# Patient Record
Sex: Male | Born: 1940 | Race: Black or African American | Hispanic: No | Marital: Single | Smoking: Current every day smoker
Health system: Southern US, Community
[De-identification: ages and names within clinical notes are randomized; demographics above are authoritative.]

## PROBLEM LIST (undated history)

## (undated) DIAGNOSIS — E1140 Type 2 diabetes mellitus with diabetic neuropathy, unspecified: Secondary | ICD-10-CM

## (undated) DIAGNOSIS — R7303 Prediabetes: Secondary | ICD-10-CM

## (undated) DIAGNOSIS — H409 Unspecified glaucoma: Secondary | ICD-10-CM

## (undated) DIAGNOSIS — M1990 Unspecified osteoarthritis, unspecified site: Secondary | ICD-10-CM

## (undated) DIAGNOSIS — I639 Cerebral infarction, unspecified: Secondary | ICD-10-CM

## (undated) DIAGNOSIS — I739 Peripheral vascular disease, unspecified: Secondary | ICD-10-CM

## (undated) DIAGNOSIS — M869 Osteomyelitis, unspecified: Secondary | ICD-10-CM

## (undated) DIAGNOSIS — I10 Essential (primary) hypertension: Secondary | ICD-10-CM

## (undated) DIAGNOSIS — E7800 Pure hypercholesterolemia, unspecified: Secondary | ICD-10-CM

## (undated) DIAGNOSIS — E119 Type 2 diabetes mellitus without complications: Secondary | ICD-10-CM

## (undated) HISTORY — PX: FOOT FRACTURE SURGERY: SHX645

## (undated) HISTORY — PX: HUMERUS FRACTURE SURGERY W/ IMPLANT: SHX671

## (undated) HISTORY — PX: BACK SURGERY: SHX140

## (undated) HISTORY — PX: COLONOSCOPY: SHX174

---
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
## 1997-12-12 ENCOUNTER — Encounter: Admission: RE | Admit: 1997-12-12 | Discharge: 1998-03-12 | Payer: Self-pay | Admitting: Orthopedic Surgery

## 1998-04-28 ENCOUNTER — Emergency Department (HOSPITAL_COMMUNITY): Admission: EM | Admit: 1998-04-28 | Discharge: 1998-04-28 | Payer: Self-pay | Admitting: Emergency Medicine

## 1998-04-28 ENCOUNTER — Encounter: Payer: Self-pay | Admitting: Emergency Medicine

## 1998-05-08 ENCOUNTER — Emergency Department (HOSPITAL_COMMUNITY): Admission: EM | Admit: 1998-05-08 | Discharge: 1998-05-08 | Payer: Self-pay | Admitting: Emergency Medicine

## 1998-05-08 ENCOUNTER — Encounter: Payer: Self-pay | Admitting: Emergency Medicine

## 1998-12-30 ENCOUNTER — Emergency Department (HOSPITAL_COMMUNITY): Admission: EM | Admit: 1998-12-30 | Discharge: 1998-12-30 | Payer: Self-pay | Admitting: Emergency Medicine

## 1999-01-13 ENCOUNTER — Emergency Department (HOSPITAL_COMMUNITY): Admission: EM | Admit: 1999-01-13 | Discharge: 1999-01-13 | Payer: Self-pay | Admitting: Internal Medicine

## 1999-01-26 ENCOUNTER — Emergency Department (HOSPITAL_COMMUNITY): Admission: EM | Admit: 1999-01-26 | Discharge: 1999-01-26 | Payer: Self-pay | Admitting: Emergency Medicine

## 1999-01-26 ENCOUNTER — Encounter: Payer: Self-pay | Admitting: Emergency Medicine

## 1999-01-27 ENCOUNTER — Encounter: Payer: Self-pay | Admitting: Emergency Medicine

## 2000-02-26 ENCOUNTER — Encounter: Payer: Self-pay | Admitting: Emergency Medicine

## 2000-02-26 ENCOUNTER — Emergency Department (HOSPITAL_COMMUNITY): Admission: EM | Admit: 2000-02-26 | Discharge: 2000-02-26 | Payer: Self-pay | Admitting: Emergency Medicine

## 2000-03-26 ENCOUNTER — Emergency Department (HOSPITAL_COMMUNITY): Admission: EM | Admit: 2000-03-26 | Discharge: 2000-03-26 | Payer: Self-pay | Admitting: Emergency Medicine

## 2000-03-26 ENCOUNTER — Encounter: Payer: Self-pay | Admitting: Emergency Medicine

## 2000-08-21 ENCOUNTER — Other Ambulatory Visit: Admission: RE | Admit: 2000-08-21 | Discharge: 2000-08-21 | Payer: Self-pay | Admitting: Internal Medicine

## 2000-12-23 ENCOUNTER — Emergency Department (HOSPITAL_COMMUNITY): Admission: EM | Admit: 2000-12-23 | Discharge: 2000-12-23 | Payer: Self-pay | Admitting: Emergency Medicine

## 2001-07-22 ENCOUNTER — Encounter: Payer: Self-pay | Admitting: *Deleted

## 2001-07-22 ENCOUNTER — Ambulatory Visit (HOSPITAL_COMMUNITY): Admission: RE | Admit: 2001-07-22 | Discharge: 2001-07-22 | Payer: Self-pay | Admitting: *Deleted

## 2001-08-10 ENCOUNTER — Ambulatory Visit (HOSPITAL_COMMUNITY): Admission: RE | Admit: 2001-08-10 | Discharge: 2001-08-10 | Payer: Self-pay | Admitting: *Deleted

## 2001-08-10 ENCOUNTER — Encounter: Payer: Self-pay | Admitting: *Deleted

## 2003-07-14 ENCOUNTER — Emergency Department (HOSPITAL_COMMUNITY): Admission: EM | Admit: 2003-07-14 | Discharge: 2003-07-14 | Payer: Self-pay | Admitting: Emergency Medicine

## 2004-05-25 ENCOUNTER — Emergency Department (HOSPITAL_COMMUNITY): Admission: EM | Admit: 2004-05-25 | Discharge: 2004-05-25 | Payer: Self-pay | Admitting: Emergency Medicine

## 2004-06-17 HISTORY — PX: INCISION AND DRAINAGE OF WOUND: SHX1803

## 2004-08-16 ENCOUNTER — Ambulatory Visit: Payer: Self-pay | Admitting: Internal Medicine

## 2004-08-28 ENCOUNTER — Ambulatory Visit: Payer: Self-pay | Admitting: Internal Medicine

## 2004-08-29 ENCOUNTER — Ambulatory Visit: Payer: Self-pay | Admitting: Family Medicine

## 2004-10-02 ENCOUNTER — Ambulatory Visit: Payer: Self-pay | Admitting: Family Medicine

## 2004-10-03 ENCOUNTER — Ambulatory Visit: Payer: Self-pay | Admitting: Family Medicine

## 2004-10-18 ENCOUNTER — Ambulatory Visit: Payer: Self-pay | Admitting: Family Medicine

## 2004-10-31 ENCOUNTER — Ambulatory Visit: Payer: Self-pay | Admitting: Family Medicine

## 2004-11-08 ENCOUNTER — Ambulatory Visit: Payer: Self-pay | Admitting: Family Medicine

## 2004-11-13 ENCOUNTER — Ambulatory Visit: Payer: Self-pay | Admitting: Family Medicine

## 2004-11-16 ENCOUNTER — Ambulatory Visit: Payer: Self-pay | Admitting: Family Medicine

## 2004-12-21 ENCOUNTER — Ambulatory Visit: Payer: Self-pay | Admitting: Family Medicine

## 2005-02-15 ENCOUNTER — Encounter (INDEPENDENT_AMBULATORY_CARE_PROVIDER_SITE_OTHER): Payer: Self-pay | Admitting: Family Medicine

## 2005-02-15 LAB — CONVERTED CEMR LAB

## 2005-02-21 ENCOUNTER — Ambulatory Visit: Payer: Self-pay | Admitting: Family Medicine

## 2005-03-17 DIAGNOSIS — K648 Other hemorrhoids: Secondary | ICD-10-CM | POA: Insufficient documentation

## 2005-03-17 DIAGNOSIS — Z860100 Personal history of colon polyps, unspecified: Secondary | ICD-10-CM | POA: Insufficient documentation

## 2005-03-27 ENCOUNTER — Ambulatory Visit (HOSPITAL_COMMUNITY): Admission: RE | Admit: 2005-03-27 | Discharge: 2005-03-27 | Payer: Self-pay | Admitting: Gastroenterology

## 2005-06-04 ENCOUNTER — Ambulatory Visit: Payer: Self-pay | Admitting: Family Medicine

## 2005-08-02 ENCOUNTER — Ambulatory Visit: Payer: Self-pay | Admitting: Internal Medicine

## 2005-08-20 ENCOUNTER — Ambulatory Visit: Payer: Self-pay | Admitting: Internal Medicine

## 2005-09-01 ENCOUNTER — Emergency Department (HOSPITAL_COMMUNITY): Admission: EM | Admit: 2005-09-01 | Discharge: 2005-09-01 | Payer: Self-pay | Admitting: *Deleted

## 2005-09-03 ENCOUNTER — Ambulatory Visit: Payer: Self-pay | Admitting: Internal Medicine

## 2005-09-17 ENCOUNTER — Ambulatory Visit: Payer: Self-pay | Admitting: Family Medicine

## 2005-09-23 ENCOUNTER — Ambulatory Visit: Payer: Self-pay | Admitting: Internal Medicine

## 2005-10-08 ENCOUNTER — Ambulatory Visit: Payer: Self-pay | Admitting: Family Medicine

## 2005-10-15 ENCOUNTER — Emergency Department (HOSPITAL_COMMUNITY): Admission: EM | Admit: 2005-10-15 | Discharge: 2005-10-15 | Payer: Self-pay | Admitting: Emergency Medicine

## 2005-10-23 ENCOUNTER — Ambulatory Visit: Payer: Self-pay | Admitting: Family Medicine

## 2005-10-29 ENCOUNTER — Ambulatory Visit: Payer: Self-pay | Admitting: Family Medicine

## 2005-11-25 ENCOUNTER — Ambulatory Visit: Payer: Self-pay | Admitting: Family Medicine

## 2005-12-06 ENCOUNTER — Ambulatory Visit: Payer: Self-pay | Admitting: Family Medicine

## 2005-12-09 ENCOUNTER — Ambulatory Visit: Payer: Self-pay | Admitting: Internal Medicine

## 2005-12-17 ENCOUNTER — Ambulatory Visit: Payer: Self-pay | Admitting: Family Medicine

## 2006-01-07 ENCOUNTER — Ambulatory Visit: Payer: Self-pay | Admitting: Family Medicine

## 2006-01-10 ENCOUNTER — Ambulatory Visit: Payer: Self-pay | Admitting: Family Medicine

## 2006-02-11 ENCOUNTER — Ambulatory Visit: Payer: Self-pay | Admitting: Family Medicine

## 2006-04-17 ENCOUNTER — Ambulatory Visit: Payer: Self-pay | Admitting: Family Medicine

## 2006-05-22 ENCOUNTER — Ambulatory Visit: Payer: Self-pay | Admitting: Family Medicine

## 2006-05-29 ENCOUNTER — Ambulatory Visit: Payer: Self-pay | Admitting: Family Medicine

## 2006-06-13 ENCOUNTER — Ambulatory Visit: Payer: Self-pay | Admitting: Internal Medicine

## 2006-06-17 HISTORY — PX: LUMBAR DISC SURGERY: SHX700

## 2006-06-18 ENCOUNTER — Ambulatory Visit (HOSPITAL_COMMUNITY): Admission: RE | Admit: 2006-06-18 | Discharge: 2006-06-18 | Payer: Self-pay | Admitting: Internal Medicine

## 2006-07-30 ENCOUNTER — Ambulatory Visit: Payer: Self-pay | Admitting: Family Medicine

## 2007-03-05 ENCOUNTER — Encounter (INDEPENDENT_AMBULATORY_CARE_PROVIDER_SITE_OTHER): Payer: Self-pay | Admitting: Family Medicine

## 2007-03-05 ENCOUNTER — Emergency Department (HOSPITAL_COMMUNITY): Admission: EM | Admit: 2007-03-05 | Discharge: 2007-03-05 | Payer: Self-pay | Admitting: Emergency Medicine

## 2007-03-05 DIAGNOSIS — I70209 Unspecified atherosclerosis of native arteries of extremities, unspecified extremity: Secondary | ICD-10-CM | POA: Insufficient documentation

## 2007-03-05 DIAGNOSIS — E1151 Type 2 diabetes mellitus with diabetic peripheral angiopathy without gangrene: Secondary | ICD-10-CM | POA: Insufficient documentation

## 2007-03-05 DIAGNOSIS — K644 Residual hemorrhoidal skin tags: Secondary | ICD-10-CM | POA: Insufficient documentation

## 2007-03-05 DIAGNOSIS — M109 Gout, unspecified: Secondary | ICD-10-CM | POA: Insufficient documentation

## 2007-03-05 DIAGNOSIS — K7689 Other specified diseases of liver: Secondary | ICD-10-CM | POA: Insufficient documentation

## 2007-03-05 DIAGNOSIS — D1800 Hemangioma unspecified site: Secondary | ICD-10-CM | POA: Insufficient documentation

## 2007-03-05 DIAGNOSIS — K439 Ventral hernia without obstruction or gangrene: Secondary | ICD-10-CM | POA: Insufficient documentation

## 2007-03-05 DIAGNOSIS — M1990 Unspecified osteoarthritis, unspecified site: Secondary | ICD-10-CM | POA: Insufficient documentation

## 2007-03-05 DIAGNOSIS — F528 Other sexual dysfunction not due to a substance or known physiological condition: Secondary | ICD-10-CM | POA: Insufficient documentation

## 2007-03-05 DIAGNOSIS — E785 Hyperlipidemia, unspecified: Secondary | ICD-10-CM | POA: Insufficient documentation

## 2007-04-07 ENCOUNTER — Emergency Department (HOSPITAL_COMMUNITY): Admission: EM | Admit: 2007-04-07 | Discharge: 2007-04-07 | Payer: Self-pay | Admitting: Emergency Medicine

## 2007-04-07 ENCOUNTER — Telehealth (INDEPENDENT_AMBULATORY_CARE_PROVIDER_SITE_OTHER): Payer: Self-pay | Admitting: *Deleted

## 2007-05-21 ENCOUNTER — Encounter (INDEPENDENT_AMBULATORY_CARE_PROVIDER_SITE_OTHER): Payer: Self-pay | Admitting: Family Medicine

## 2007-06-10 ENCOUNTER — Ambulatory Visit: Payer: Self-pay | Admitting: Family Medicine

## 2007-06-10 LAB — CONVERTED CEMR LAB: Blood Glucose, Fingerstick: 462

## 2007-06-17 LAB — CONVERTED CEMR LAB
ALT: 12 units/L (ref 0–53)
AST: 9 units/L (ref 0–37)
Albumin: 4.2 g/dL (ref 3.5–5.2)
Alkaline Phosphatase: 73 units/L (ref 39–117)
BUN: 10 mg/dL (ref 6–23)
Basophils Absolute: 0 10*3/uL (ref 0.0–0.1)
Basophils Relative: 1 % (ref 0–1)
CO2: 22 meq/L (ref 19–32)
Calcium: 8.9 mg/dL (ref 8.4–10.5)
Chloride: 99 meq/L (ref 96–112)
Cholesterol: 293 mg/dL — ABNORMAL HIGH (ref 0–200)
Creatinine, Ser: 0.97 mg/dL (ref 0.40–1.50)
Eosinophils Absolute: 0.8 10*3/uL — ABNORMAL HIGH (ref 0.2–0.7)
Eosinophils Relative: 12 % — ABNORMAL HIGH (ref 0–5)
Glucose, Bld: 370 mg/dL — ABNORMAL HIGH (ref 70–99)
HCT: 44 % (ref 39.0–52.0)
HDL: 56 mg/dL (ref >39)
Hemoglobin: 14.3 g/dL (ref 13.0–17.0)
LDL Cholesterol: 194 mg/dL — ABNORMAL HIGH (ref 0–99)
Lymphocytes Relative: 29 % (ref 12–46)
Lymphs Abs: 1.9 10*3/uL (ref 0.7–4.0)
MCHC: 32.5 g/dL (ref 30.0–36.0)
MCV: 86.6 fL (ref 78.0–100.0)
Monocytes Absolute: 0.4 10*3/uL (ref 0.1–1.0)
Monocytes Relative: 6 % (ref 3–12)
Neutro Abs: 3.3 10*3/uL (ref 1.7–7.7)
Neutrophils Relative %: 52 % (ref 43–77)
Platelets: 284 10*3/uL (ref 150–400)
Potassium: 4.6 meq/L (ref 3.5–5.3)
RBC: 5.08 M/uL (ref 4.22–5.81)
RDW: 15 % (ref 11.5–15.5)
Sodium: 134 meq/L — ABNORMAL LOW (ref 135–145)
TSH: 1.446 microintl units/mL (ref 0.350–5.50)
Total Bilirubin: 0.5 mg/dL (ref 0.3–1.2)
Total CHOL/HDL Ratio: 5.2
Total Protein: 8 g/dL (ref 6.0–8.3)
Triglycerides: 215 mg/dL — ABNORMAL HIGH (ref <150)
VLDL: 43 mg/dL — ABNORMAL HIGH (ref 0–40)
WBC: 6.3 10*3/uL (ref 4.0–10.5)

## 2007-06-29 ENCOUNTER — Encounter (INDEPENDENT_AMBULATORY_CARE_PROVIDER_SITE_OTHER): Payer: Self-pay | Admitting: Family Medicine

## 2007-06-30 ENCOUNTER — Emergency Department (HOSPITAL_COMMUNITY): Admission: EM | Admit: 2007-06-30 | Discharge: 2007-06-30 | Payer: Self-pay | Admitting: Emergency Medicine

## 2007-08-02 ENCOUNTER — Emergency Department (HOSPITAL_COMMUNITY): Admission: EM | Admit: 2007-08-02 | Discharge: 2007-08-02 | Payer: Self-pay | Admitting: Emergency Medicine

## 2007-08-02 ENCOUNTER — Encounter (INDEPENDENT_AMBULATORY_CARE_PROVIDER_SITE_OTHER): Payer: Self-pay | Admitting: Family Medicine

## 2007-08-04 ENCOUNTER — Ambulatory Visit: Payer: Self-pay | Admitting: Family Medicine

## 2007-08-04 DIAGNOSIS — R519 Headache, unspecified: Secondary | ICD-10-CM | POA: Insufficient documentation

## 2007-08-04 DIAGNOSIS — R51 Headache: Secondary | ICD-10-CM | POA: Insufficient documentation

## 2007-08-04 LAB — CONVERTED CEMR LAB
Blood Glucose, Fingerstick: 509
Hgb A1c MFr Bld: >14 %
Sed Rate: 20 mm/hr — ABNORMAL HIGH (ref 0–16)

## 2007-08-07 ENCOUNTER — Telehealth (INDEPENDENT_AMBULATORY_CARE_PROVIDER_SITE_OTHER): Payer: Self-pay | Admitting: Family Medicine

## 2007-08-10 ENCOUNTER — Ambulatory Visit: Payer: Self-pay | Admitting: Family Medicine

## 2007-08-10 LAB — CONVERTED CEMR LAB: Blood Glucose, Fingerstick: 274

## 2007-08-19 ENCOUNTER — Ambulatory Visit: Payer: Self-pay | Admitting: Family Medicine

## 2007-08-19 LAB — CONVERTED CEMR LAB: Blood Glucose, Fingerstick: 600

## 2007-08-27 ENCOUNTER — Encounter (INDEPENDENT_AMBULATORY_CARE_PROVIDER_SITE_OTHER): Payer: Self-pay | Admitting: Family Medicine

## 2007-09-10 ENCOUNTER — Emergency Department (HOSPITAL_COMMUNITY): Admission: EM | Admit: 2007-09-10 | Discharge: 2007-09-10 | Payer: Self-pay | Admitting: Emergency Medicine

## 2007-09-16 ENCOUNTER — Ambulatory Visit: Payer: Self-pay | Admitting: Nurse Practitioner

## 2007-09-16 DIAGNOSIS — B3749 Other urogenital candidiasis: Secondary | ICD-10-CM | POA: Insufficient documentation

## 2007-09-16 LAB — CONVERTED CEMR LAB
Bilirubin Urine: NEGATIVE
Blood Glucose, Fingerstick: 315
Glucose, Urine, Semiquant: >=1000
Nitrite: NEGATIVE
Protein, U semiquant: NEGATIVE
Specific Gravity, Urine: 1.01
Urobilinogen, UA: 0.2
WBC Urine, dipstick: NEGATIVE
pH: 5

## 2007-09-24 ENCOUNTER — Emergency Department (HOSPITAL_COMMUNITY): Admission: EM | Admit: 2007-09-24 | Discharge: 2007-09-24 | Payer: Self-pay | Admitting: Emergency Medicine

## 2007-09-28 ENCOUNTER — Telehealth (INDEPENDENT_AMBULATORY_CARE_PROVIDER_SITE_OTHER): Payer: Self-pay | Admitting: Family Medicine

## 2007-10-01 ENCOUNTER — Encounter (INDEPENDENT_AMBULATORY_CARE_PROVIDER_SITE_OTHER): Payer: Self-pay | Admitting: Family Medicine

## 2007-10-13 ENCOUNTER — Ambulatory Visit: Payer: Self-pay | Admitting: Family Medicine

## 2007-10-13 LAB — CONVERTED CEMR LAB: Blood Glucose, Fingerstick: 509

## 2007-10-14 ENCOUNTER — Telehealth (INDEPENDENT_AMBULATORY_CARE_PROVIDER_SITE_OTHER): Payer: Self-pay | Admitting: Family Medicine

## 2007-10-20 ENCOUNTER — Telehealth (INDEPENDENT_AMBULATORY_CARE_PROVIDER_SITE_OTHER): Payer: Self-pay | Admitting: Family Medicine

## 2007-10-23 ENCOUNTER — Telehealth (INDEPENDENT_AMBULATORY_CARE_PROVIDER_SITE_OTHER): Payer: Self-pay | Admitting: Family Medicine

## 2007-11-27 ENCOUNTER — Emergency Department (HOSPITAL_COMMUNITY): Admission: EM | Admit: 2007-11-27 | Discharge: 2007-11-27 | Payer: Self-pay | Admitting: Emergency Medicine

## 2007-12-03 ENCOUNTER — Ambulatory Visit (HOSPITAL_COMMUNITY): Admission: RE | Admit: 2007-12-03 | Discharge: 2007-12-03 | Payer: Self-pay | Admitting: Internal Medicine

## 2007-12-03 ENCOUNTER — Ambulatory Visit: Payer: Self-pay | Admitting: Internal Medicine

## 2007-12-03 DIAGNOSIS — R071 Chest pain on breathing: Secondary | ICD-10-CM | POA: Insufficient documentation

## 2007-12-03 LAB — CONVERTED CEMR LAB: Blood Glucose, Fingerstick: 415

## 2007-12-10 ENCOUNTER — Emergency Department (HOSPITAL_COMMUNITY): Admission: EM | Admit: 2007-12-10 | Discharge: 2007-12-10 | Payer: Self-pay | Admitting: Emergency Medicine

## 2007-12-18 ENCOUNTER — Emergency Department (HOSPITAL_COMMUNITY): Admission: EM | Admit: 2007-12-18 | Discharge: 2007-12-18 | Payer: Self-pay | Admitting: Emergency Medicine

## 2007-12-23 ENCOUNTER — Ambulatory Visit: Payer: Self-pay | Admitting: Family Medicine

## 2007-12-23 LAB — CONVERTED CEMR LAB: Blood Glucose, Fingerstick: 282

## 2007-12-28 ENCOUNTER — Telehealth (INDEPENDENT_AMBULATORY_CARE_PROVIDER_SITE_OTHER): Payer: Self-pay | Admitting: *Deleted

## 2008-01-05 ENCOUNTER — Emergency Department (HOSPITAL_COMMUNITY): Admission: EM | Admit: 2008-01-05 | Discharge: 2008-01-05 | Payer: Self-pay | Admitting: Emergency Medicine

## 2008-01-05 ENCOUNTER — Telehealth (INDEPENDENT_AMBULATORY_CARE_PROVIDER_SITE_OTHER): Payer: Self-pay | Admitting: Family Medicine

## 2008-01-08 ENCOUNTER — Emergency Department (HOSPITAL_COMMUNITY): Admission: EM | Admit: 2008-01-08 | Discharge: 2008-01-08 | Payer: Self-pay | Admitting: Emergency Medicine

## 2008-01-11 ENCOUNTER — Ambulatory Visit: Payer: Self-pay | Admitting: Family Medicine

## 2008-01-11 DIAGNOSIS — B0229 Other postherpetic nervous system involvement: Secondary | ICD-10-CM | POA: Insufficient documentation

## 2008-01-11 LAB — CONVERTED CEMR LAB: Blood Glucose, Fingerstick: 304

## 2008-02-06 ENCOUNTER — Telehealth (INDEPENDENT_AMBULATORY_CARE_PROVIDER_SITE_OTHER): Payer: Self-pay | Admitting: Family Medicine

## 2008-02-12 ENCOUNTER — Encounter
Admission: RE | Admit: 2008-02-12 | Discharge: 2008-05-12 | Payer: Self-pay | Admitting: Physical Medicine & Rehabilitation

## 2008-02-12 ENCOUNTER — Emergency Department (HOSPITAL_COMMUNITY): Admission: EM | Admit: 2008-02-12 | Discharge: 2008-02-12 | Payer: Self-pay | Admitting: Emergency Medicine

## 2008-02-15 ENCOUNTER — Ambulatory Visit: Payer: Self-pay | Admitting: Physical Medicine & Rehabilitation

## 2008-02-17 ENCOUNTER — Encounter (INDEPENDENT_AMBULATORY_CARE_PROVIDER_SITE_OTHER): Payer: Self-pay | Admitting: Family Medicine

## 2008-02-17 ENCOUNTER — Ambulatory Visit (HOSPITAL_COMMUNITY)
Admission: RE | Admit: 2008-02-17 | Discharge: 2008-02-17 | Payer: Self-pay | Admitting: Physical Medicine & Rehabilitation

## 2008-02-21 ENCOUNTER — Emergency Department (HOSPITAL_COMMUNITY): Admission: EM | Admit: 2008-02-21 | Discharge: 2008-02-21 | Payer: Self-pay | Admitting: Emergency Medicine

## 2008-02-23 ENCOUNTER — Ambulatory Visit: Payer: Self-pay | Admitting: Family Medicine

## 2008-02-23 DIAGNOSIS — IMO0002 Reserved for concepts with insufficient information to code with codable children: Secondary | ICD-10-CM | POA: Insufficient documentation

## 2008-02-23 LAB — CONVERTED CEMR LAB: Blood Glucose, Fingerstick: 356

## 2008-02-29 ENCOUNTER — Ambulatory Visit (HOSPITAL_BASED_OUTPATIENT_CLINIC_OR_DEPARTMENT_OTHER): Admission: RE | Admit: 2008-02-29 | Discharge: 2008-02-29 | Payer: Self-pay | Admitting: Urology

## 2008-03-11 ENCOUNTER — Ambulatory Visit: Payer: Self-pay | Admitting: Physical Medicine & Rehabilitation

## 2008-03-15 ENCOUNTER — Encounter (INDEPENDENT_AMBULATORY_CARE_PROVIDER_SITE_OTHER): Payer: Self-pay | Admitting: Family Medicine

## 2008-03-21 ENCOUNTER — Encounter
Admission: RE | Admit: 2008-03-21 | Discharge: 2008-05-03 | Payer: Self-pay | Admitting: Physical Medicine & Rehabilitation

## 2008-03-22 ENCOUNTER — Telehealth (INDEPENDENT_AMBULATORY_CARE_PROVIDER_SITE_OTHER): Payer: Self-pay | Admitting: *Deleted

## 2008-04-01 ENCOUNTER — Emergency Department (HOSPITAL_COMMUNITY): Admission: EM | Admit: 2008-04-01 | Discharge: 2008-04-01 | Payer: Self-pay | Admitting: Emergency Medicine

## 2008-04-12 ENCOUNTER — Emergency Department (HOSPITAL_COMMUNITY): Admission: EM | Admit: 2008-04-12 | Discharge: 2008-04-12 | Payer: Self-pay | Admitting: Emergency Medicine

## 2008-04-13 ENCOUNTER — Emergency Department (HOSPITAL_COMMUNITY): Admission: EM | Admit: 2008-04-13 | Discharge: 2008-04-13 | Payer: Self-pay | Admitting: Emergency Medicine

## 2008-04-22 ENCOUNTER — Ambulatory Visit: Payer: Self-pay | Admitting: Physical Medicine & Rehabilitation

## 2008-04-23 ENCOUNTER — Ambulatory Visit (HOSPITAL_COMMUNITY)
Admission: RE | Admit: 2008-04-23 | Discharge: 2008-04-23 | Payer: Self-pay | Admitting: Physical Medicine & Rehabilitation

## 2008-05-09 ENCOUNTER — Ambulatory Visit: Payer: Self-pay | Admitting: Physical Medicine & Rehabilitation

## 2008-05-23 ENCOUNTER — Encounter
Admission: RE | Admit: 2008-05-23 | Discharge: 2008-08-21 | Payer: Self-pay | Admitting: Physical Medicine & Rehabilitation

## 2008-05-23 ENCOUNTER — Ambulatory Visit: Payer: Self-pay | Admitting: Physical Medicine & Rehabilitation

## 2008-05-30 ENCOUNTER — Ambulatory Visit: Payer: Self-pay | Admitting: Physical Medicine & Rehabilitation

## 2008-06-09 ENCOUNTER — Emergency Department (HOSPITAL_COMMUNITY): Admission: EM | Admit: 2008-06-09 | Discharge: 2008-06-09 | Payer: Self-pay | Admitting: Emergency Medicine

## 2008-06-17 HISTORY — PX: CIRCUMCISION: SHX1350

## 2008-06-27 ENCOUNTER — Ambulatory Visit: Payer: Self-pay | Admitting: Physical Medicine & Rehabilitation

## 2008-07-25 ENCOUNTER — Ambulatory Visit: Payer: Self-pay | Admitting: Physical Medicine & Rehabilitation

## 2008-09-12 ENCOUNTER — Encounter
Admission: RE | Admit: 2008-09-12 | Discharge: 2008-09-19 | Payer: Self-pay | Admitting: Physical Medicine & Rehabilitation

## 2008-09-20 ENCOUNTER — Ambulatory Visit (HOSPITAL_COMMUNITY): Admission: RE | Admit: 2008-09-20 | Discharge: 2008-09-20 | Payer: Self-pay | Admitting: Neurosurgery

## 2008-12-05 ENCOUNTER — Emergency Department (HOSPITAL_COMMUNITY): Admission: EM | Admit: 2008-12-05 | Discharge: 2008-12-05 | Payer: Self-pay | Admitting: Emergency Medicine

## 2008-12-28 ENCOUNTER — Encounter: Admission: RE | Admit: 2008-12-28 | Discharge: 2008-12-28 | Payer: Self-pay | Admitting: Neurosurgery

## 2009-01-14 ENCOUNTER — Emergency Department (HOSPITAL_COMMUNITY): Admission: EM | Admit: 2009-01-14 | Discharge: 2009-01-14 | Payer: Self-pay | Admitting: Emergency Medicine

## 2009-02-02 ENCOUNTER — Encounter
Admission: RE | Admit: 2009-02-02 | Discharge: 2009-02-07 | Payer: Self-pay | Admitting: Physical Medicine & Rehabilitation

## 2009-02-07 ENCOUNTER — Ambulatory Visit: Payer: Self-pay | Admitting: Physical Medicine & Rehabilitation

## 2009-02-13 ENCOUNTER — Emergency Department (HOSPITAL_COMMUNITY): Admission: EM | Admit: 2009-02-13 | Discharge: 2009-02-13 | Payer: Self-pay | Admitting: Emergency Medicine

## 2009-02-15 ENCOUNTER — Emergency Department (HOSPITAL_COMMUNITY): Admission: EM | Admit: 2009-02-15 | Discharge: 2009-02-15 | Payer: Self-pay | Admitting: Emergency Medicine

## 2009-03-20 ENCOUNTER — Encounter
Admission: RE | Admit: 2009-03-20 | Discharge: 2009-06-14 | Payer: Self-pay | Admitting: Physical Medicine & Rehabilitation

## 2009-04-03 ENCOUNTER — Ambulatory Visit: Payer: Self-pay | Admitting: Physical Medicine & Rehabilitation

## 2009-05-14 ENCOUNTER — Emergency Department (HOSPITAL_COMMUNITY): Admission: EM | Admit: 2009-05-14 | Discharge: 2009-05-14 | Payer: Self-pay | Admitting: Emergency Medicine

## 2009-06-09 ENCOUNTER — Emergency Department (HOSPITAL_COMMUNITY): Admission: EM | Admit: 2009-06-09 | Discharge: 2009-06-09 | Payer: Self-pay | Admitting: Emergency Medicine

## 2009-06-18 ENCOUNTER — Emergency Department (HOSPITAL_COMMUNITY): Admission: EM | Admit: 2009-06-18 | Discharge: 2009-06-18 | Payer: Self-pay | Admitting: Emergency Medicine

## 2009-07-18 ENCOUNTER — Telehealth (INDEPENDENT_AMBULATORY_CARE_PROVIDER_SITE_OTHER): Payer: Self-pay | Admitting: *Deleted

## 2009-07-18 ENCOUNTER — Emergency Department (HOSPITAL_COMMUNITY)
Admission: EM | Admit: 2009-07-18 | Discharge: 2009-07-18 | Payer: Self-pay | Source: Home / Self Care | Admitting: Emergency Medicine

## 2009-07-25 ENCOUNTER — Encounter
Admission: RE | Admit: 2009-07-25 | Discharge: 2009-08-18 | Payer: Self-pay | Admitting: Physical Medicine & Rehabilitation

## 2009-07-26 ENCOUNTER — Emergency Department (HOSPITAL_COMMUNITY): Admission: EM | Admit: 2009-07-26 | Discharge: 2009-07-26 | Payer: Self-pay | Admitting: Emergency Medicine

## 2009-07-28 ENCOUNTER — Encounter: Payer: Self-pay | Admitting: Physician Assistant

## 2009-08-22 ENCOUNTER — Ambulatory Visit (HOSPITAL_COMMUNITY): Admission: RE | Admit: 2009-08-22 | Discharge: 2009-08-23 | Payer: Self-pay | Admitting: Neurosurgery

## 2009-09-01 ENCOUNTER — Emergency Department (HOSPITAL_COMMUNITY): Admission: EM | Admit: 2009-09-01 | Discharge: 2009-09-01 | Payer: Self-pay | Admitting: Emergency Medicine

## 2009-12-16 ENCOUNTER — Emergency Department (HOSPITAL_COMMUNITY): Admission: EM | Admit: 2009-12-16 | Discharge: 2009-12-16 | Payer: Self-pay | Admitting: Emergency Medicine

## 2009-12-29 ENCOUNTER — Emergency Department (HOSPITAL_COMMUNITY): Admission: EM | Admit: 2009-12-29 | Discharge: 2009-12-29 | Payer: Self-pay | Admitting: Emergency Medicine

## 2009-12-29 ENCOUNTER — Encounter (INDEPENDENT_AMBULATORY_CARE_PROVIDER_SITE_OTHER): Payer: Self-pay | Admitting: Emergency Medicine

## 2010-01-01 ENCOUNTER — Ambulatory Visit: Payer: Self-pay | Admitting: Vascular Surgery

## 2010-01-01 ENCOUNTER — Encounter (INDEPENDENT_AMBULATORY_CARE_PROVIDER_SITE_OTHER): Payer: Self-pay | Admitting: Neurosurgery

## 2010-01-01 ENCOUNTER — Ambulatory Visit: Admission: RE | Admit: 2010-01-01 | Discharge: 2010-01-01 | Payer: Self-pay | Admitting: Neurosurgery

## 2010-01-17 ENCOUNTER — Emergency Department (HOSPITAL_COMMUNITY): Admission: EM | Admit: 2010-01-17 | Discharge: 2010-01-17 | Payer: Self-pay | Admitting: Emergency Medicine

## 2010-02-07 ENCOUNTER — Emergency Department (HOSPITAL_COMMUNITY): Admission: EM | Admit: 2010-02-07 | Discharge: 2010-02-07 | Payer: Self-pay | Admitting: Emergency Medicine

## 2010-02-13 ENCOUNTER — Encounter (HOSPITAL_BASED_OUTPATIENT_CLINIC_OR_DEPARTMENT_OTHER): Admission: RE | Admit: 2010-02-13 | Discharge: 2010-05-14 | Payer: Self-pay | Admitting: General Surgery

## 2010-02-28 ENCOUNTER — Ambulatory Visit (HOSPITAL_COMMUNITY): Admission: RE | Admit: 2010-02-28 | Discharge: 2010-02-28 | Payer: Self-pay | Admitting: General Surgery

## 2010-03-22 ENCOUNTER — Ambulatory Visit (HOSPITAL_COMMUNITY): Admission: RE | Admit: 2010-03-22 | Discharge: 2010-03-22 | Payer: Self-pay | Admitting: General Surgery

## 2010-03-24 ENCOUNTER — Ambulatory Visit (HOSPITAL_COMMUNITY)
Admission: RE | Admit: 2010-03-24 | Discharge: 2010-03-24 | Payer: Self-pay | Source: Home / Self Care | Admitting: General Surgery

## 2010-04-09 ENCOUNTER — Encounter: Payer: Self-pay | Admitting: Cardiovascular Disease

## 2010-04-10 ENCOUNTER — Encounter: Payer: Self-pay | Admitting: Cardiovascular Disease

## 2010-04-10 ENCOUNTER — Ambulatory Visit: Payer: Self-pay

## 2010-04-10 ENCOUNTER — Encounter: Admission: RE | Admit: 2010-04-10 | Discharge: 2010-04-10 | Payer: Self-pay | Admitting: Neurology

## 2010-04-10 ENCOUNTER — Encounter: Payer: Self-pay | Admitting: Cardiology

## 2010-05-15 ENCOUNTER — Encounter (HOSPITAL_BASED_OUTPATIENT_CLINIC_OR_DEPARTMENT_OTHER)
Admission: RE | Admit: 2010-05-15 | Discharge: 2010-05-28 | Payer: Self-pay | Source: Home / Self Care | Attending: General Surgery | Admitting: General Surgery

## 2010-05-27 ENCOUNTER — Emergency Department (HOSPITAL_COMMUNITY)
Admission: EM | Admit: 2010-05-27 | Discharge: 2010-05-27 | Payer: Self-pay | Source: Home / Self Care | Admitting: Emergency Medicine

## 2010-05-29 ENCOUNTER — Encounter (HOSPITAL_BASED_OUTPATIENT_CLINIC_OR_DEPARTMENT_OTHER)
Admission: RE | Admit: 2010-05-29 | Discharge: 2010-07-03 | Payer: Self-pay | Source: Home / Self Care | Attending: General Surgery | Admitting: General Surgery

## 2010-06-15 ENCOUNTER — Emergency Department (HOSPITAL_COMMUNITY)
Admission: EM | Admit: 2010-06-15 | Discharge: 2010-06-15 | Payer: Self-pay | Source: Home / Self Care | Admitting: Emergency Medicine

## 2010-06-15 ENCOUNTER — Encounter (INDEPENDENT_AMBULATORY_CARE_PROVIDER_SITE_OTHER): Payer: Self-pay | Admitting: Emergency Medicine

## 2010-07-17 NOTE — Miscellaneous (Signed)
Summary: Orders Update  Clinical Lists Changes  Orders: Added new Test order of Arterial Duplex Lower Extremity (Arterial Duplex Low) - Signed 

## 2010-07-17 NOTE — Medication Information (Signed)
Summary: RX Folder/ALTERNATE DRUG AUTH.  RX Folder/ALTERNATE DRUG AUTH.   Imported By: Arta Bruce 10/01/2007 10:33:38  _____________________________________________________________________  External Attachment:    Type:   Image     Comment:   External Document

## 2010-07-17 NOTE — Assessment & Plan Note (Signed)
Summary: F/U HEADACES////RJP   Vital Signs:  Patient Profile:   70 Years Old Male Height:     73 inches Weight:      222.5 pounds Temp:     96.9 degrees F oral Pulse rate:   92 / minute Pulse rhythm:   regular Resp:     24 per minute BP sitting:   122 / 72  (left arm) Cuff size:   regular  Pt. in pain?   yes    Location:   headache    Intensity:   6    Type:       throbbing  Vitals Entered By: Verdis Prime SMA (August 19, 2007 12:10 PM)              Is Patient Diabetic? Yes  CBG Result 600 CBG Device ID A  Does patient need assistance? Functional Status Self care Ambulation Normal Comments still having headaches but not as intense, and not as frequent, going to the bathroom more frequently,  Roxicet 5-325-take 1-2 tabs by mouth q 6hrs as needed-Peter Tucich Ciprofloxacin HCL 500mg  taapo-take 1 tab twice a day-John Knapp pt also has no refills on Lyrica     Chief Complaint:  follow on headaches.  History of Present Illness: Here for f/u on right-sided temporal headaches. See prior notes for history. Pt reports improvement in headache severity and frequency on prednisone and amitriptyline.He is not taking any other pain medication and has not used the roxicet(from his podiatrist) in the last week.Still notes occasional blurred vision but no loss of vision or double vision.No nausea or vomiting. No pain with opening or closing his mouth or while chewing at meals. The "stinging" discomfort noted in the right occipital area at base of his skull has fully resolved.    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  1960  Left foot surgery       2.  1968  Right arm fracture       3.  1999  Left toe fractures       4.  2002  Left eye mass surgery  Dr. Dione Booze       5.  03/2005  Colonoscopy  Dr. Ewing Schlein    Risk Factors:  Caffeine use:  5+ drinks per day Alcohol use:  yes    Type:  moonshine Exercise:  no Seatbelt use:  75 % Sun Exposure:  frequently  Family History Risk Factors:    Family History of MI in females < 76 years old:  no    Family History of MI in males <  22 years old:  no    Physical Exam  General:     Well-developed,well-nourished,in no acute distress; alert,appropriate and cooperative throughout examination Head:     No tenderness or reproducible pain to palpation over right temporal or right occipital areas. Eyes:     No corneal or conjunctival inflammation noted. EOMI. Perrla. Mouth:     Normal TMJ movement bilaterally. No pain along mandible to palpation and no pain with mastication movements/open-closing mouth. Neurologic:     alert & oriented X3 and cranial nerves II-XII intact grossly.      Impression & Recommendations:  Problem # 1:  HEADACHE (ICD-784.0) His chronic daily headaches have improved. Will gradually taper down steroid use over next 10 days.  Pt to return to clinic if headaches worsen as prednisone tapered. If he does well with taper and headaches are rare/resolved off prednisone then he can f/u only if needed for other acute problem.Routine f/u in 4 months.  Continue amitripyline 25mg  at bedtime for the entire 30 days then d/c.  Taper prednisone 10 mg tablets as follows: 4qd x 2d, 2tabs qd x 2d, 1and 1/2 tabs once daily x2d,1 tab once  daily x2d,and then 1/2 tablet once daily x 2 days then off.   His updated medication list for this problem includes:    Hydrocodone-acetaminophen 7.5-750 Mg Tabs (Hydrocodone-acetaminophen) .Marland Kitchen... Per dr.zeigler(podiatrist)    Ibuprofen 800 Mg Tabs (Ibuprofen) .Marland Kitchen... Take one tablet by mouth every 8 hours as needed for headache take with food   Problem # 2:  DIABETES MELLITUS, TYPE II (ICD-250.00) Poor control;worsened in past week by steroids. Had increase in Novolog 70/30 on 08/10/07 when prednisone started. Pt to continue at this dose even with prednisone being tapered and eventually stopped. His updated medication list for this problem includes:    Glucotrol Xl 10 Mg Tb24 (Glipizide) ..... Every am    Metformin Hcl 1000 Mg Tabs (Metformin hcl) .Marland Kitchen..Marland Kitchen Two times a day    Benazepril Hcl 5 Mg Tabs (Benazepril hcl) .Marland Kitchen... 1 tablet by mouth daily    Novolog Mix 70/30 Flexpen 70-30 % Susp (Insulin aspart prot & aspart) ..... Inject 40 units in am and 30 units in pm   Complete Medication List: 1)  Cialis 20 Mg Tabs (Tadalafil) .Marland Kitchen.. 1 by mouth prior to sexual activity, limit use to one in 24 hour period 2)  Glucotrol Xl 10 Mg Tb24 (Glipizide) .... Every am 3)  Crestor 40 Mg Tabs (Rosuvastatin calcium) .Marland Kitchen.. 1 once daily 4)  Metformin Hcl 1000 Mg Tabs (Metformin hcl) .... Two times a day 5)  Benazepril Hcl 5 Mg Tabs (Benazepril hcl) .Marland Kitchen.. 1 tablet by mouth daily 6)  Novolog Mix 70/30 Flexpen 70-30 % Susp (Insulin aspart prot & aspart) .... Inject 40 units in am and 30 units in pm 7)  Hydrocodone-acetaminophen 7.5-750 Mg Tabs (Hydrocodone-acetaminophen) .... Per dr.zeigler(podiatrist) 8)  Lyrica 100 Mg Caps (Pregabalin) .... Take 1 capsule by mouth every 12 hours(per dr.zeigler) 9)  Ibuprofen 800 Mg Tabs (Ibuprofen) .... Take one tablet by mouth every 8 hours as needed for headache take with food 10)  Amitriptyline Hcl 25 Mg Tabs (Amitriptyline hcl) .... Take one tablet at bedtime 11)  Prednisone 10 Mg  Tabs (Prednisone) .... Take 4 tablets by mouth once daily x 2 days then 2 tabs x2d, 1 and 1/2 tabs x2d,1 tab x2d, and 1/2 tab x2days   Patient Instructions: 1)  See MD if headaches worsen as taper or come off prednisone. 2)  Otherwise, routine f/u on DM in 4 months.    Prescriptions: PREDNISONE 10 MG  TABS (PREDNISONE) Take 4 tablets by mouth once daily x 2 days then 2 tabs x2d, 1 and 1/2 tabs x2d,1 tab x2d, and 1/2 tab x2days  #20 x 0   Entered and Authorized by:   Beverley Fiedler MD   Signed by:   Beverley Fiedler MD on 08/19/2007   Method used:   Print then Give to Patient   RxID:   [id number]  ]

## 2010-07-17 NOTE — Letter (Signed)
Summary: [facility]   Imported By: Arta Bruce 04/26/2008 15:02:33  _____________________________________________________________________  External Attachment:    Type:   Image     Comment:   External Document

## 2010-07-17 NOTE — Assessment & Plan Note (Signed)
Summary: for surgery///kt   Vital Signs:  Patient Profile:   70 Years Old Male Height:     73 inches Weight:      220 pounds BMI:     29.13 Temp:     97.2 degrees F Pulse rate:   96 / minute Pulse rhythm:   regular Resp:     18 per minute BP sitting:   126 / 80  (left arm) Cuff size:   large  Pt. in pain?   yes    Location:   back    Intensity:   8  Vitals Entered By: Vesta Mixer CMA (December 23, 2007 8:47 AM)              Is Patient Diabetic? Yes  CBG Result 282  Does patient need assistance? Ambulation Normal     Chief Complaint:  Back is killing him has been to ED about 6 times since last ov, but still hurting.  Also, and needs to have a circumsicion done.Marland Kitchen  History of Present Illness: Here for f/u on thoracic/side pain. Describes as burning pain. Has been to ER multiple times. Saw Dr.Mulberry in 6/09 but no specific findings on exam or on thoracic spine/CXR done in 6/09. Pt never had an identifiable rash.  He is also ready for urology referral for  circumcision. He has documented re-current ballinitis and fungal infections. We have previouslt discussed the solution of a circumcision and he is ready to proceed.    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  1960  Left foot surgery       2.  1968   Right arm fracture       3.  1999  Left toe fractures       4.  2002  Left eye mass surgery  Dr. Dione Booze       5.  03/2005  Colonoscopy  Dr. Ewing Schlein      Physical Exam  General:     Well-developed,well-nourished,in no acute distress; alert,appropriate and cooperative throughout examination Chest Wall:     No bruises,step-off,rash. Pain is lateral and almost along dermatome of mid-right lateral ribs. Lungs:     Normal respiratory effort, chest expands symmetrically. Lungs are clear to auscultation, no crackles or wheezes. Heart:     Normal rate and regular rhythm. S1 and S2 normal without gallop, murmur, click, rub or other extra sounds.    Impression & Recommendations:  Problem # 1:  CHEST WALL PAIN, ACUTE (ICD-786.52) Persistent problem for patient. Possible pulled rib muscle(?). May use roxicet  in addition to his ibuprofen. His updated medication list for this problem includes:    Ibuprofen 800 Mg Tabs (Ibuprofen) .Marland Kitchen... Take one tablet by mouth every 8 hours as needed for headache take with food   Problem # 2:  CANDIDIASIS, GLANS PENIS (ICD-112.2) Recurrent problem with ballinitis. Urology referral for therapeutic circumcision.  Complete Medication List: 1)  Cialis 20 Mg Tabs (Tadalafil) .Marland Kitchen.. 1 by mouth prior to sexual activity, limit use to one in 24 hour period 2)  Glucotrol Xl 10 Mg Tb24 (Glipizide) .... Every am 3)  Metformin Hcl 1000 Mg Tabs (Metformin hcl) .... Two times a day 4)  Benazepril Hcl 5 Mg Tabs (Benazepril hcl) .Marland Kitchen.. 1 tablet by mouth daily 5)  Novolog Mix 70/30 Flexpen 70-30 % Susp (Insulin aspart prot & aspart) .... Inject 60  units in am and 40  units in pm 6)  Ibuprofen 800 Mg Tabs (Ibuprofen) .... Take one tablet by mouth every 8 hours as needed for headache take with food 7)  Amitriptyline Hcl 25 Mg Tabs (Amitriptyline hcl) .... Take one tablet at bedtime 8)  Vicodin 5-500 Mg Tabs (Hydrocodone-acetaminophen) .... Take 1 to 2 tablets by mouth every 6  hours as needed for pain *john r knapp 9)  Travatan Z 0.004 % Soln (Travoprost) .... Instill 1 drop in each eye at bedtime *robert groat 10)  Nystatin-triamcinolone 100000-0.1 Unit/gm-% Oint (Nystatin-triamcinolone) .Marland Kitchen.. 1 application to affected area two times a day 11)  Lipitor 80 Mg Tabs (Atorvastatin calcium) .... Take 1 tablet by mouth once a day for cholesterol. prescribed to replace the crestor which his insurance won't cover. 12)  Roxicet 5-325 Mg Tabs (Oxycodone-acetaminophen) .... Take 1-2 tablets by mouth every 6 hours as needed  (dr.tucich) 13)  Spectazole Cream  .... Apply to gland of penis and surrounding foreskin x 10 days 14)  Diflucan 200 Mg Tabs (Fluconazole) .... Take 1 tablet by mouth once a day x 9 days 15)  Oxycodone-acetaminophen 5-325 Mg Tabs (Oxycodone-acetaminophen) .Marland Kitchen.. 1-2 by mouth q 6 hours as needed pain 16)  Cyclobenzaprine Hcl 5 Mg Tabs (Cyclobenzaprine hcl) .Marland Kitchen.. 1 by mouth three times a day x 5 days  Other Orders: Urology Referral (Urology)   Patient Instructions: 1)  Please schedule a follow-up appointment as needed.   Prescriptions: ROXICET 5-325 MG  TABS (OXYCODONE-ACETAMINOPHEN) Take 1-2 tablets by mouth every 6 hours as needed  (Dr.Tucich)  #60 x 0   Entered and Authorized by:   Beverley Fiedler MD   Signed by:   Beverley Fiedler MD on 12/23/2007   Method used:   Print then Give to Patient   RxID:   [id number]  ]

## 2010-07-17 NOTE — Letter (Signed)
Summary: [facility]   Imported By: Arta Bruce 05/21/2007 09:41:42  _____________________________________________________________________  External Attachment:    Type:   Image     Comment:   External Document

## 2010-07-17 NOTE — Progress Notes (Signed)
Summary: call back  Phone Note Call from Patient Call back at Home Phone [phone]   Caller: Patient Summary of Call: Patient lmovm requesting a call back Initial call taken by: Rock Nephew CMA,  July 18, 2009 4:01 PM  Follow-up for Phone Call        Patient needs to est care, he is a diabetic. Per the patient he has Medicare and Medicaid. Patient also spoke with Harriett Sine and he will call back to discuss his insurance and if it a type that is taken here. Follow-up by: Lucious Groves,  July 18, 2009 4:15 PM

## 2010-07-17 NOTE — Progress Notes (Signed)
Summary: NEEDS HIS GLIPIZIDE  Phone Note Call from Patient   Summary of Call: [name] PT. FORGOT TO TO CALL HIS GLIPIZIDE TO [facility] IN [place] SO THEY CAN DELIVER IT AND HE WANTS TO KNOW IF YOU CAN AT LEAST CALL HIM IN ABOUT 15 TO GET HIM OVER UNTIL HIS ORDER IS SHIPPED TO HIM FROM [place]. [facility] [address] Initial call taken by: Leodis Rains,  August 07, 2007 2:39 PM  Follow-up for Phone Call        pt in office to be seen today. Follow-up by: Mikey College CMA,  August 10, 2007 11:47 AM  Additional Follow-up for Phone Call Additional follow up Details #1::        note seen after pt left andhe didn't mention at visit.[facility] [address]      Prescriptions: GLUCOTROL XL 10 MG  TB24 (GLIPIZIDE) every am  #30 x 2   Entered and Authorized by:   Beverley Fiedler MD   Signed by:   Beverley Fiedler MD on 08/11/2007   Method used:   Electronically [facility] to ...       [facility]  [address] [phone]*       [address]       [place], [place]  [address]       Ph: [phone] or [phone]       Fax: [phone]   RxID:   [id number]

## 2010-07-17 NOTE — Assessment & Plan Note (Signed)
Summary: HEADACHE////KT   Vital Signs:  Patient Profile:   70 Years Old Male Height:     73 inches Weight:      230 pounds BMI:     30.45 Temp:     97.4 degrees F Pulse rate:   76 / minute Pulse rhythm:   regular Resp:     20 per minute BP sitting:   120 / 78  (left arm) Cuff size:   large  Pt. in pain?   yes    Location:   head    Intensity:   9  Vitals Entered By: Vesta Mixer CMA (August 10, 2007 11:57 AM)              Is Patient Diabetic? Yes  CBG Result 274  Does patient need assistance? Ambulation Normal     Chief Complaint:  headache x 3 weeks and ibuprofen not helping.Marland Kitchen  History of Present Illness: Here for f/u on isolated right temporal Headache. The ibuprofen helped some with pain but never resolved headache. Has occasional blurriness but no loss of vision..Has some "stinging pain" Right posterior neck area which is intermittent. No skin rashes.No arm pain or numbness or tingling. No arm weakness. No fevers. No nausea or vomiting. No ear pain. Pain is still 8-9/10 per patient(he does not appear in severe distress) but he is not a complainer.     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  1960  Left foot surgery       2.  1968  Right arm fracture       3.  1999  Left toe fractures  4.  2002  Left eye mass surgery  Dr. Dione Booze       5.  03/2005  Colonoscopy  Dr. Ewing Schlein      Physical Exam  General:     Well-developed,well-nourished,in no acute distress; alert,appropriate and cooperative throughout examination Eyes:     No corneal or conjunctival inflammation noted. EOMI. Perrla. Fundi sharp bilaterally. Ears:     External ear exam shows no significant lesions or deformities.  Otoscopic examination reveals clear canals, tympanic membranes are intact bilaterally without bulging, retraction, inflammation or discharge. Hearing is grossly normal bilaterally. Nose:     External nasal examination shows no deformity or inflammation. Nasal mucosa are pink and moist without lesions or exudates. Mouth:     Oral mucosa and oropharynx without lesions or exudates.  Teeth without active problem. No pain with palpation of jaw or opening/closing of mouth. Neck:     supple, full ROM, no  cervical lymphadenopathy, and no neck tenderness.   Neurologic:     alert & oriented X3 and cranial nerves II-XII intact to specific testing. Pt is mildly tender to palpation over right temporal area in a very localized pattern. He reports a "stinging sensation" in the right posterior occipital area but no palpable abnormality. Skin:     No rashes or blister-like lesions on head,neck,upper trunk. Psych:     Oriented X3, memory intact for recent and remote, normally interactive, good eye contact, and not anxious appearing.   Pt appears very stoic and does not demonstrate pain on a 9/10 level but says this is the worst pain that he can recall.    Impression & Recommendations:  Problem # 1:  HEADACHE (ICD-784.0) MD called and spoke with Dr.Reynolds,neurologist. Unlikely to be temporal arteritis with normal ESR x2. But symptoms not classic for alternate diagnosis.  Recommended trial of steroids and TCA If no improvement in headache in 1-2 weeks then check MRI/MRA and refer to see neurologist. His  updated medication list for this problem includes:    Hydrocodone-acetaminophen 7.5-750 Mg Tabs (Hydrocodone-acetaminophen) .Marland Kitchen... Per dr.zeigler(podiatrist)    Ibuprofen 800 Mg Tabs (Ibuprofen) .Marland Kitchen... Take one tablet by mouth every 8 hours as needed for headache take with food   Problem # 2:  DIABETES MELLITUS, TYPE II (ICD-250.00) MD d/w patient that steroids may increase BS and that he can increase his 70/30 insulin by 5-10 units two times a day beyond his 40units AM or 30 units PM if needed. His updated medication list for this problem includes:    Glucotrol Xl 10 Mg Tb24 (Glipizide) ..... Every am    Metformin Hcl 1000 Mg Tabs (Metformin hcl) .Marland Kitchen..Marland Kitchen Two times a day    Benazepril Hcl 5 Mg Tabs (Benazepril hcl) .Marland Kitchen... 1 tablet by mouth daily    Novolog Mix 70/30 Flexpen 70-30 % Susp (Insulin aspart prot & aspart) ..... Inject 40 units in am and 30 units in pm  Orders: Capillary Blood Glucose (82948) Fingerstick (04540)   Complete Medication List: 1)  Cialis 20 Mg Tabs (Tadalafil) .Marland Kitchen.. 1 by mouth prior to sexual activity, limit use to one in 24 hour period 2)  Glucotrol Xl 10 Mg Tb24 (Glipizide) .... Every am 3)  Crestor 40 Mg Tabs (Rosuvastatin calcium) .Marland Kitchen.. 1 once daily 4)  Metformin Hcl 1000 Mg Tabs (Metformin hcl) .... Two times a day 5)  Benazepril Hcl 5 Mg Tabs (Benazepril hcl) .Marland Kitchen.. 1 tablet by mouth daily 6)  Novolog Mix 70/30 Flexpen 70-30 % Susp (Insulin aspart prot & aspart) .... Inject 40 units in am and 30 units in pm 7)  Hydrocodone-acetaminophen 7.5-750 Mg Tabs (Hydrocodone-acetaminophen) .... Per dr.zeigler(podiatrist) 8)  Lyrica 100 Mg Caps (Pregabalin) .... Take 1 capsule by mouth every 12 hours(per dr.zeigler) 9)  Ibuprofen 800 Mg Tabs (Ibuprofen) .... Take one tablet by mouth every 8 hours as needed for headache take with food 10)  Prednisone 20 Mg Tabs (Prednisone) .... Take 3 tablets by mouth each day 11)  Amitriptyline Hcl 25 Mg Tabs (Amitriptyline hcl) .... Take one  tablet at bedtime   Patient Instructions: 1)  Please schedule a follow-up appointment in 1 week. 2)  Keep headache diary 3)  Check sugars 3x each day and write down. May increase 70/30 insulin by 5-10 units AM and PM if sugars running over 200's on prednisone    Prescriptions: AMITRIPTYLINE HCL 25 MG TABS (AMITRIPTYLINE HCL) Take one tablet at bedtime  #30 x 0  Entered and Authorized by:   Beverley Fiedler MD   Signed by:   Beverley Fiedler MD on 08/10/2007   Method used:   Print then Give to Patient   RxID:   [id number] PREDNISONE 20 MG TABS (PREDNISONE) Take 3 tablets by mouth each day  #30 x 0   Entered and Authorized by:   Beverley Fiedler MD   Signed by:   Beverley Fiedler MD on 08/10/2007   Method used:   Print then Give to Patient   RxID:   [id number]  ]

## 2010-07-17 NOTE — Progress Notes (Signed)
Summary: [facility]bst.  lipitor 80qd for crestor 40qd.  Phone Note Outgoing Call   Summary of Call: CMA to call for prior authorization on lipitor as crestor is not covered. Paperwork is on [name] paper chart. Notify patient that once he has been on the new lipitor 80mg  once daily for 8 weeks then he must come for FASTING labs(FLP,CMET). Initial call taken by: Beverley Fiedler MD,  September 28, 2007 6:07 PM  Follow-up for Phone Call        rx faxed to pt's pharmacy Follow-up by: Vesta Mixer CMA,  September 29, 2007 3:39 PM    New/Updated Medications: LIPITOR 80 MG  TABS (ATORVASTATIN CALCIUM) Take 1 tablet by mouth once a day for cholesterol. Prescribed to replace the crestor which his insurance won't cover.   Prescriptions: LIPITOR 80 MG  TABS (ATORVASTATIN CALCIUM) Take 1 tablet by mouth once a day for cholesterol. Prescribed to replace the crestor which his insurance won't cover.  #30 x 2   Entered and Authorized by:   Beverley Fiedler MD   Signed by:   Beverley Fiedler MD on 09/28/2007   Method used:   Electron[facility] to ...       [facility] [address] [phone]*       [address]       [place], [place]  [address]       Ph: [phone] or [phone]       Fax: [phone]   RxID:   [id number]

## 2010-07-17 NOTE — Assessment & Plan Note (Signed)
Summary: 1425 PT//RIGHT SIDE PAIN///KT   Vital Signs:  Patient Profile:   70 Years Old Male Height:     73 inches Weight:      213 pounds Temp:     98.8 degrees F oral Pulse rate:   80 / minute Pulse rhythm:   regular Resp:     18 per minute BP sitting:   124 / 70  (right arm)  Pt. in pain?   yes    Location:   side pain    Intensity:   8  Vitals Entered By: Mikey College CMA (December 03, 2007 2:16 PM)              Is Patient Diabetic? Yes  CBG Result 415 CBG Device ID A  Does patient need assistance? Functional Status Self care Ambulation Normal     Chief Complaint:  rt side pain/pt states going on for a while.Marland Kitchen  History of Present Illness: 1.  DM:  extremely poorly controlled.  Last A1C>14.0%.  Pt.'s sugar was >700 when in ED 11/27/07 for right flank pain.  States he probably takes Glucotrol, Metformin and insulin just 4 doses weekly. Pt. denies etoh and drug use.  2.  Right sided thorax pain:  Has had for 3-4 weeks.  Feels like someone made cuts in skin and poured alcohol on it.  Was diagnosed 11/27/07 possibly with early shingles.  Never did develop skin lesions.  Was given Hydrocodone and Ibuprofen.  The former helps somewhat.  No hx of injury or heavy lifting to area or back recently.  Always there somewhat, but gets worse at times.  Not associated with any position or movement.  No change with eating, BM or urination. Feels about the same as it did when first started.      Current Allergies: No known allergies       Physical Exam  General:     NAD Chest Wall:     Tender over at least 3 ribs in right lateral chest area.  Pain exacerbated when pressing over mid thoracic vertebrae.  No pain with compression of rib cage anteroposteriorly.  Also with increased discomfort when just brushing lightly over skin of area. Lungs:     Normal respiratory effort, chest expands symmetrically. Lungs are clear to auscultation, no crackles or wheezes. Heart:     Normal rate and  regular rhythm. S1 and S2 normal without gallop, murmur, click, rub or other extra sounds. Radial pulses equal and normal. Abdomen:     Bowel sounds positive,abdomen soft and non-tender without masses, organomegaly or hernias noted. Skin:     no rash in area of right chest complaints.    Impression & Recommendations:  Problem # 1:  DIABETES MELLITUS, TYPE II (ICD-250.00) Long discussion regarding complications of poorly controlled diabetes.  Pt. likely not motivated to care for himself--denies etoh or other abuse to make this more of a problem. His updated medication list for this problem includes:    Glucotrol Xl 10 Mg Tb24 (Glipizide) ..... Every am    Metformin Hcl 1000 Mg Tabs (Metformin hcl) .Marland Kitchen..Marland Kitchen Two times a day    Benazepril Hcl 5 Mg Tabs (Benazepril hcl) .Marland Kitchen... 1 tablet by mouth daily    Novolog Mix 70/30 Flexpen 70-30 % Susp (Insulin aspart prot & aspart) ..... Inject 60  units in am and 40  units in pm   Problem # 2:  CHEST WALL PAIN, ACUTE (ICD-786.52) Check CXR with rib detail and Thoracic vertebrae to look  for injury --not clear this is Herpes zoster without exanthem.  Unlikely to improve with antivirals at this time regardless and if no other cause can be found, consider gabapentin for pain control. His updated medication list for this problem includes:    Ibuprofen 800 Mg Tabs (Ibuprofen) .Marland Kitchen... Take one tablet by mouth every 8 hours as needed for headache take with food  Orders: Diagnostic X-Ray/Fluoroscopy (Diagnostic X-Ray/Flu)   Complete Medication List: 1)  Cialis 20 Mg Tabs (Tadalafil) .Marland Kitchen.. 1 by mouth prior to sexual activity, limit use to one in 24 hour period 2)  Glucotrol Xl 10 Mg Tb24 (Glipizide) .... Every am 3)  Metformin Hcl 1000 Mg Tabs (Metformin hcl) .... Two times a day 4)  Benazepril Hcl 5 Mg Tabs (Benazepril hcl) .Marland Kitchen.. 1 tablet by mouth daily 5)  Novolog Mix 70/30 Flexpen 70-30 % Susp (Insulin aspart prot & aspart) .... Inject 60  units in am and 40   units in pm 6)  Ibuprofen 800 Mg Tabs (Ibuprofen) .... Take one tablet by mouth every 8 hours as needed for headache take with food 7)  Amitriptyline Hcl 25 Mg Tabs (Amitriptyline hcl) .... Take one tablet at bedtime 8)  Vicodin 5-500 Mg Tabs (Hydrocodone-acetaminophen) .... Take 1 to 2 tablets by mouth every 6 hours as needed for pain *john r knapp 9)  Travatan Z 0.004 % Soln (Travoprost) .... Instill 1 drop in each eye at bedtime *robert groat 10)  Nystatin-triamcinolone 100000-0.1 Unit/gm-% Oint (Nystatin-triamcinolone) .Marland Kitchen.. 1 application to affected area two times a day 11)  Lipitor 80 Mg Tabs (Atorvastatin calcium) .... Take 1 tablet by mouth once a day for cholesterol. prescribed to replace the crestor which his insurance won't cover. 12)  Roxicet 5-325 Mg Tabs (Oxycodone-acetaminophen) .... Take 1-2 tablets by mouth every 6 hours as needed  (dr.tucich) 13)  Spectazole Cream  .... Apply to gland of penis and surrounding foreskin x 10 days 14)  Diflucan 200 Mg Tabs (Fluconazole) .... Take 1 tablet by mouth once a day x 9 days   Patient Instructions: 1)  Keep appt with Dr. [name] in July. 2)  Get xrays done ASAP 3)  TAKE YOUR MEDS REGULARLY   ]

## 2010-07-17 NOTE — Assessment & Plan Note (Signed)
Summary: Acute - Candidiasis   Vital Signs:  Patient Profile:   70 Years Old Male Height:     73 inches Weight:      222 pounds BMI:     29.40 BSA:     2.25 Temp:     97.8 degrees F oral Pulse rate:   88 / minute Pulse rhythm:   regular Resp:     20 per minute BP sitting:   100 / 68  (left arm) Cuff size:   regular  Pt. in pain?   yes    Location:   lower area  Vitals Entered By: Levon Hedger (September 16, 2007 12:15 PM)              Is Patient Diabetic? Yes  CBG Result 315 CBG Device ID B  Does patient need assistance? Ambulation Normal Comments pt brought medicines today, and states he has not eaten yet.     History of Present Illness:  Pt went to the ER on this past sunday. pt was given hydrocodone for pain.  Pt reports that scans were done but he is not sure of the results no fever. no vomiting. Notes that he saw blood with urination this morning.  some dysuria. unsure if any lesions present on penis last episode this morning. Denies any kidney stones. No prostate problems  Diabetes - pt has not been checking blood sugar at home. Admits that he drinks "moonshine" - ETOH.     Updated Prior Medication List: CIALIS 20 MG  TABS (TADALAFIL) 1 by mouth prior to sexual activity, limit use to one in 24 hour period GLUCOTROL XL 10 MG  TB24 (GLIPIZIDE) every am CRESTOR 40 MG  TABS (ROSUVASTATIN CALCIUM) 1 once daily METFORMIN HCL 1000 MG  TABS (METFORMIN HCL) two times a day BENAZEPRIL HCL 5 MG TABS (BENAZEPRIL HCL) 1 tablet by mouth daily NOVOLOG MIX 70/30 FLEXPEN 70-30 %  SUSP (INSULIN ASPART PROT & ASPART) Inject 40 units in AM and 30 units in PM HYDROCODONE-ACETAMINOPHEN 7.5-750 MG  TABS (HYDROCODONE-ACETAMINOPHEN) per Dr.Zeigler(podiatrist) LYRICA 100 MG  CAPS (PREGABALIN) Take 1 capsule by mouth every 12 hours(per Dr.Zeigler) IBUPROFEN 800 MG TABS (IBUPROFEN) Take one tablet by mouth every 8 hours as needed for headache Take with food AMITRIPTYLINE HCL 25  MG TABS (AMITRIPTYLINE HCL) Take one tablet at bedtime PREDNISONE 10 MG  TABS (PREDNISONE) Take 4 tablets by mouth once daily x 2 days then 2 tabs x2d, 1 and 1/2 tabs x2d,1 tab x2d, and 1/2 tab x2days VICODIN 5-500 MG  TABS (HYDROCODONE-ACETAMINOPHEN) take 1 to 2 tablets by mouth every 6 hours as needed for pain *john r knapp TRAVATAN Z 0.004 %  SOLN (TRAVOPROST) instill 1 drop in each eye at bedtime *Ernesto Rutherford  Current Allergies: No known allergies     Risk Factors:  Tobacco use:  current    Counseled to quit/cut down tobacco use:  yes Caffeine use:  5+ drinks per day Alcohol use:  yes    Type:  moonshine Exercise:  no Seatbelt use:  75 % Sun Exposure:  frequently  Family History Risk Factors:    Family History of MI in females < 53 years old:  no    Family History of MI in males < 69 years old:  no   Review of Systems  Resp      Denies cough.  GI      Denies abdominal pain, nausea, and vomiting.  GU      Complains  of genital sores.   Physical Exam  General:     alert.   Head:     normocephalic.   Eyes:     pupils round.   Lungs:     normal breath sounds.   Heart:     normal rate and regular rhythm.   Abdomen:     soft and non-tender.   Genitalia:     uncircumcised.   excoriation, erythema of glans penis white discharge under foreskin   Detailed Genitourinary Exam    Penis: uncircumcised and lesion: excoriation to glans penis    Impression & Recommendations:  Problem # 1:  CANDIDIASIS, GLANS PENIS (ICD-112.2)  pt advised to practice good hygiene need to take diflucan and use cream pt also needs to lower blood sugar  Problem # 2:  DIABETES MELLITUS, TYPE II (ICD-250.00) Blood sugar is elevated.   His updated medication list for this problem includes:    Glucotrol Xl 10 Mg Tb24 (Glipizide) ..... Every am    Metformin Hcl 1000 Mg Tabs (Metformin hcl) .Marland Kitchen..Marland Kitchen Two times a day    Benazepril Hcl 5 Mg Tabs (Benazepril hcl) .Marland Kitchen... 1 tablet by mouth  daily    Novolog Mix 70/30 Flexpen 70-30 % Susp (Insulin aspart prot & aspart) ..... Inject 40 units in am and 30 units in pm  Orders: Capillary Blood Glucose (16109) Fingerstick (60454) Admin of Therapeutic Inj  intramuscular or subcutaneous (09811) Insulin 5 units (J1815) UA Dipstick w/o Micro (manual) (91478)   Complete Medication List: 1)  Cialis 20 Mg Tabs (Tadalafil) .Marland Kitchen.. 1 by mouth prior to sexual activity, limit use to one in 24 hour period 2)  Glucotrol Xl 10 Mg Tb24 (Glipizide) .... Every am 3)  Crestor 40 Mg Tabs (Rosuvastatin calcium) .Marland Kitchen.. 1 once daily 4)  Metformin Hcl 1000 Mg Tabs (Metformin hcl) .... Two times a day 5)  Benazepril Hcl 5 Mg Tabs (Benazepril hcl) .Marland Kitchen.. 1 tablet by mouth daily 6)  Novolog Mix 70/30 Flexpen 70-30 % Susp (Insulin aspart prot & aspart) .... Inject 40 units in am and 30 units in pm 7)  Hydrocodone-acetaminophen 7.5-750 Mg Tabs (Hydrocodone-acetaminophen) .... Per dr.zeigler(podiatrist) 8)  Lyrica 100 Mg Caps (Pregabalin) .... Take 1 capsule by mouth every 12 hours(per dr.zeigler) 9)  Ibuprofen 800 Mg Tabs (Ibuprofen) .... Take one tablet by mouth every 8 hours as needed for headache take with food 10)  Amitriptyline Hcl 25 Mg Tabs (Amitriptyline hcl) .... Take one tablet at bedtime 11)  Vicodin 5-500 Mg Tabs (Hydrocodone-acetaminophen) .... Take 1 to 2 tablets by mouth every 6 hours as needed for pain *john r knapp 12)  Travatan Z 0.004 % Soln (Travoprost) .... Instill 1 drop in each eye at bedtime *robert groat 13)  Nystatin-triamcinolone 100000-0.1 Unit/gm-% Oint (Nystatin-triamcinolone) .Marland Kitchen.. 1 application to affected area two times a day   Patient Instructions: 1)  You likely have yeast infection because your blood sugars are elevated. You need to take medication as ordered and follow diet. 2)  Your medication has been sent to the pharmacy. 3)  Follow up with Dr. [name] as previously scheduled.    Prescriptions: NYSTATIN-TRIAMCINOLONE  100000-0.1 UNIT/GM-%  OINT (NYSTATIN-TRIAMCINOLONE) 1 application to affected area two times a day  #30gm x 0   Entered and Authorized by:   Lehman Prom FNP   Signed by:   Lehman Prom FNP on 09/16/2007   Method used:   Electronically sent to ...       [facility] [address] [id number]       [address]       [place], [place]  [address]       Ph: [phone] or [phone]       Fax: [phone]   RxID:   [id number] DIFLUCAN 200 MG  TABS (FLUCONAZOLE) 1 tablet by mouth x 1 dose  #1 x 0   Entered and Authorized by:   Lehman Prom FNP   Signed by:   Lehman Prom FNP on 09/16/2007   Method used:   Electronically sent to ...       [facility] [address] [phone]*       [address]       [place], [place]  [address]       Ph: [phone] or [phone]       Fax: [phone]   RxID:   [id number]  ]  Medication Administration  Injection # 1:    Medication: Insulin 5 units    Diagnosis: DIABETES MELLITUS, TYPE II (ICD-250.00)    Route: IM    Site: R deltoid    Exp Date: 05/2009    Lot #: [id number]    Mfr: novo nordisk    Patient tolerated injection without complications    Given by: Levon Hedger (September 16, 2007 1:21 PM)  Orders Added: 1)  Capillary Blood Glucose [82948] 2)  Fingerstick [36416] 3)  Admin of Therapeutic Inj  intramuscular or subcutaneous [96372] 4)  Insulin 5 units [J1815] 5)  Est. Patient Level III [31517] 6)  UA Dipstick w/o Micro (manual) [81002]  Laboratory Results   Urine Tests  Date/Time Received: September 16, 2007 1:27 PM  Date/Time Reported: September 16, 2007 1:28 PM   Routine Urinalysis   Color: yellow Appearance: Clear Glucose: >=1000   (Normal Range: Negative) Bilirubin: negative   (Normal Range: Negative) Ketone: moderate (40)   (Normal Range: Negative) Spec. Gravity: 1.010   (Normal Range: 1.003-1.035) Blood: trace-intact   (Normal Range: Negative) pH: 5.0   (Normal  Range: 5.0-8.0) Protein: negative   (Normal Range: Negative) Urobilinogen: 0.2   (Normal Range: 0-1) Nitrite: negative   (Normal Range: Negative) Leukocyte Esterace: negative   (Normal Range: Negative)     Blood Tests     CBG Random:: 315

## 2010-07-17 NOTE — Letter (Signed)
Summary: [facility]  [facility]   Imported By: Arta Bruce 04/01/2008 11:09:19  _____________________________________________________________________  External Attachment:    Type:   Image     Comment:   External Document

## 2010-07-17 NOTE — Progress Notes (Signed)
Summary: MD call...increase insulin  Phone Note Outgoing Call   Summary of Call: MD called Pt. he just got his diflucan so will reschedule his appt until next week. Per pt he is taking the prescribed 70/30 insulin at 60 u AM and 40u PM but still with CBGs in the 300's. MD instructed him to increase his 70/30 to 70 units AM and 50 units PM. To see MD next week and bring BS diary for review. Initial call taken by: Beverley Fiedler MD,  Oct 20, 2007 9:10 AM

## 2010-07-17 NOTE — Assessment & Plan Note (Signed)
Summary: HAS SOME PROBLEMS///KT   Vital Signs:  Patient Profile:   70 Years Old Male Height:     73 inches Weight:      219 pounds BMI:     29.00 Temp:     97.7 degrees F oral Pulse rate:   72 / minute Pulse rhythm:   regular Resp:     20 per minute BP sitting:   98 / 62  (left arm) Cuff size:   regular  Pt. in pain?   no  Vitals Entered By: Murvin Natal (October 13, 2007 2:29 PM)              Is Patient Diabetic? Yes  CBG Result 509 CBG Device ID [id number]  Does patient need assistance? Functional Status Self care Ambulation Normal     Serial Vital Signs/Assessments:  Time      Position  BP       Pulse  Resp  Temp     By                     120/78                         Beverley Fiedler MD   Chief Complaint:  bleeding from penis x 1 week and pain in penis 5/10.  History of Present Illness: Here for f/u on foreskin swelling and rash. Was seen 09/16/2007 and treated with diflucan 200 mg tablet x1 and lotrisone cream Not .improved per patient.  This is a re-occurrent problem for patient nd was last treated in 2007(paper chart reviewed). Pt also has known BPH and last saw Dr.Dahlstadt in 2007. May be exaccerbated by  ~ 1 month of prednisone in 3/09 when patient being treated for chronic daily headaches. He has been off prednisone for 1 month. No further headaches.  Pt says he is taking his 70/30 insulin at 40 units AM and only 20 units PM(though should be 30). He says he did take his insulin this AM. He admits to dietary indiscretions.    Updated Prior Medication List: CIALIS 20 MG  TABS (TADALAFIL) 1 by mouth prior to sexual activity, limit use to one in 24 hour period GLUCOTROL XL 10 MG  TB24 (GLIPIZIDE) every am METFORMIN HCL 1000 MG  TABS (METFORMIN HCL) two times a day BENAZEPRIL HCL 5 MG TABS (BENAZEPRIL HCL) 1 tablet by mouth daily NOVOLOG MIX 70/30 FLEXPEN 70-30 %  SUSP (INSULIN ASPART PROT & ASPART) Inject 40 units in AM and 30 units in PM  HYDROCODONE-ACETAMINOPHEN 7.5-750 MG  TABS (HYDROCODONE-ACETAMINOPHEN) per Dr.Zeigler(podiatrist) LYRICA 100 MG  CAPS (PREGABALIN) Take 1 capsule by mouth every 12 hours(per Dr.Zeigler) IBUPROFEN 800 MG TABS (IBUPROFEN) Take one tablet by mouth every 8 hours as needed for headache Take with food AMITRIPTYLINE HCL 25 MG TABS (AMITRIPTYLINE HCL) Take one tablet at bedtime VICODIN 5-500 MG  TABS (HYDROCODONE-ACETAMINOPHEN) take 1 to 2 tablets by mouth every 6 hours as needed for pain *john r knapp TRAVATAN Z 0.004 %  SOLN (TRAVOPROST) instill 1 drop in each eye at bedtime *Robert Groat NYSTATIN-TRIAMCINOLONE 100000-0.1 UNIT/GM-%  OINT (NYSTATIN-TRIAMCINOLONE) 1 application to affected area two times a day LIPITOR 80 MG  TABS (ATORVASTATIN CALCIUM) Take 1 tablet by mouth once a day for cholesterol. Prescribed to replace the crestor which his insurance won't cover.  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  1960  Left foot surgery       2.  1968  Right arm fracture       3.  1999  Left toe fractures       4.  2002  Left eye mass surgery  Dr. Dione Booze       5.  03/2005  Colonoscopy  Dr. Ewing Schlein    Risk Factors:     Year started:  smokes a pipe Passive smoke exposure:  no Drug use:  no Alcohol use:  no    Physical Exam   General:     Pt answers questions appropriately. He is A&Ox3 and can converse on local events. However, he seems surprised that his BS is so high and withdrawn about his DM management and self-influence. Genitalia:     uncircumcised. Tight foreskin but does retract fully. Has fissures on foreskin and moist white smegma. The glands itself is not swollen.     Impression & Recommendations:  Problem # 1:  CANDIDIASIS, GLANS PENIS (ICD-112.2) The cracking and minimal swelling of foreskin is not at level of ballinitis that patient had in 2007. 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  We discussed that once this infection has resolved and his DM is controlled that he should consider returning to see his urologist(Dr.Dahlstadt) for consideration of circumcision.  Spectazole cream two times a day x10 days. Diflucan 150mg  once daily x 10 days.  Problem # 2:  DIABETES MELLITUS, TYPE II (ICD-250.00) [name] diabetes is not controlled. His CBG at 09/16/2007 visit was 315 and it is 509 at today's visit. MD d/w him that he needs close f/u and better monitoring of his BS and DM diet.We will increase his 70/30 insulin to 60 units AM and 40 units PM.  He is to check and record his BS two times a day.  He is to f/u with MD weekly until DM controlled. Regular insulin 20 units SQ to be giveb now. His updated medication list for this problem includes:    Glucotrol Xl 10 Mg Tb24 (Glipizide) ..... Every am    Metformin Hcl 1000 Mg Tabs (Metformin hcl) .Marland Kitchen..Marland Kitchen Two times a day    Benazepril Hcl 5 Mg Tabs (Benazepril hcl) .Marland Kitchen... 1 tablet by mouth daily    Novolog Mix 70/30 Flexpen 70-30 % Susp (Insulin aspart prot & aspart) ..... Inject 60  units in am and 40  units in pm  Orders: Capillary Blood Glucose (16109) Fingerstick (60454)   Problem # 3:  HEADACHE (ICD-784.0) His chronic daily headaches have resolved with taper off steroids in 3/09. He has not had steroids in over a  month and has not had a  re-occurrence of his headaches. The following medications were removed from the medication list:    Hydrocodone-acetaminophen 7.5-750 Mg Tabs (Hydrocodone-acetaminophen) .Marland Kitchen... Per dr.zeigler(podiatrist)  His updated medication list for this problem includes:    Ibuprofen 800 Mg Tabs (Ibuprofen) .Marland Kitchen... Take one tablet by mouth every 8 hours as needed for headache take with food    Vicodin 5-500 Mg Tabs (Hydrocodone-acetaminophen) .Marland Kitchen... Take 1 to 2 tablets by mouth every 6 hours as needed for pain *john r knapp    Roxicet 5-325 Mg Tabs (Oxycodone-acetaminophen) .Marland Kitchen... Take 1-2 tablets by mouth every 6 hours as needed  (dr.tucich)   Problem # 4:  BENIGN PROSTATIC HYPERTROPHY (ICD-600.00) Last saw Dr.Dahlstadt in 2007.  Needs f/u and discussion of possible elective circumcision.  Complete Medication List: 1)  Cialis 20 Mg Tabs (Tadalafil) .Marland Kitchen.. 1 by mouth prior to sexual activity, limit use to one in 24 hour period 2)  Glucotrol Xl 10 Mg Tb24 (Glipizide) .... Every am 3)  Metformin Hcl 1000 Mg Tabs (Metformin hcl) .... Two times a day 4)  Benazepril Hcl 5 Mg Tabs (Benazepril hcl) .Marland Kitchen.. 1 tablet by mouth daily 5)  Novolog Mix 70/30 Flexpen 70-30 % Susp (Insulin aspart prot & aspart) .... Inject 60  units in am and 40  units in pm 6)  Ibuprofen 800 Mg Tabs (Ibuprofen) .... Take one tablet by mouth every 8 hours as needed for headache take with food 7)  Amitriptyline Hcl 25 Mg Tabs (Amitriptyline hcl) .... Take one tablet at bedtime 8)  Vicodin 5-500 Mg Tabs (Hydrocodone-acetaminophen) .... Take 1 to 2 tablets by mouth every 6 hours as needed for pain *john r knapp 9)  Travatan Z 0.004 % Soln (Travoprost) .... Instill 1 drop in each eye at bedtime *robert groat 10)  Nystatin-triamcinolone 100000-0.1 Unit/gm-% Oint (Nystatin-triamcinolone) .Marland Kitchen.. 1 application to affected area two times a day 11)  Lipitor 80 Mg Tabs (Atorvastatin calcium) .... Take 1 tablet by mouth once a day for cholesterol. prescribed  to replace the crestor which his insurance won't cover. 12)  Roxicet 5-325 Mg Tabs (Oxycodone-acetaminophen) .... Take 1-2 tablets by mouth every 6 hours as needed  (dr.tucich) 13)  Fluconazole 150 Mg Tabs (Fluconazole) .... Take 1 tablet by mouth once a day x 10 days 14)  Spectazole Cream  .... Apply to gland of penis and surrounding foreskin x 10 days   Patient Instructions: 1)  #1 You got 20 extra units of regular insulin today. Eat supper by 5pm.Check your BS before you eat and as long as the BS is above 80,give the 70/30 insulin of 40 units before supper. 2)  #2 Check BS before breakfast and before supper and write down for yout doctor to look at next week. 3)  #3 See Dr.VR in 1 week to check your penis and your DM    Prescriptions: SPECTAZOLE CREAM Apply to gland of penis and surrounding foreskin x 10 days  #45 grams x 1   Entered and Authorized by:   Beverley Fiedler MD   Signed by:   Beverley Fiedler MD on 10/13/2007   Method used:   Print then Give to Patient   RxID:   [id number] FLUCONAZOLE 150 MG  TABS (FLUCONAZOLE) Take 1 tablet by mouth once a day x 10 days  #10 x 0   Entered and Authorized by:   Beverley Fiedler MD   Signed by:   Beverley Fiedler MD on 10/13/2007   Method used:  Print then Give to Patient   RxID:   [id number] NOVOLOG MIX 70/30 FLEXPEN 70-30 %  SUSP (INSULIN ASPART PROT & ASPART) Inject 60  units in AM and 40  units in PM  #1 month x 0   Entered and Authorized by:   Beverley Fiedler MD   Signed by:   Beverley Fiedler MD on 10/13/2007   Method used:   Print then Give to Patient   RxID:   [id number] NOVOLOG MIX 70/30 FLEXPEN 70-30 %  SUSP (INSULIN ASPART PROT & ASPART) Inject 60  units in AM and 40  units in PM  #1 month x 0   Entered and Authorized by:   Beverley Fiedler MD   Signed by:   Beverley Fiedler MD on 10/13/2007   Method used:   Print then Give to Patient   RxID:   [id number] SPECTAZOLE CREAM Apply to gland of penis and  surrounding foreskin x 10 days  #45 grams x 1   Entered and Authorized by:   Beverley Fiedler MD   Signed by:   Beverley Fiedler MD on 10/13/2007   Method used:   Print then Give to Patient   RxID:   [id number] FLUCONAZOLE 150 MG  TABS (FLUCONAZOLE) Take 1 tablet by mouth once a day x 10 days  #10 x 0   Entered and Authorized by:   Beverley Fiedler MD   Signed by:   Beverley Fiedler MD on 10/13/2007   Method used:   Electronically sent to ...       [facility] [address] [phone]*       [address]       [place], [place]  [address]       Ph: [phone] or [phone]       Fax: [phone]   RxID:   [id number]  ]  Last LDL:                                                 194 (06/10/2007 9:20:00 PM)

## 2010-07-17 NOTE — Assessment & Plan Note (Signed)
Summary: fu from ed /change packing under arm//kt   Vital Signs:  Patient Profile:   70 Years Old Male Height:     73 inches Weight:      216 pounds BMI:     28.60 BSA:     2.23 Temp:     98 degrees F oral Pulse rate:   76 / minute Pulse rhythm:   regular Resp:     18 per minute BP sitting:   144 / 84  (right arm) Cuff size:   regular  Pt. in pain?   no  Vitals Entered By: Gaylyn Cheers RN (February 23, 2008 10:41 AM)              Is Patient Diabetic? Yes  Nutritional Status Detail fasting CBG Result 356 CBG Device ID A  Does patient need assistance? Functional Status Self care Ambulation Normal     Chief Complaint:  F/U form ER visit.  History of Present Illness: #1 Here for ER f/u from 02/21/08 when seen for left axillary boil and had I and D. No abx given. Has drained well per patient and pain improved. ER records reviewed and no cx done.  #2 [facility]/08. Pt says "nothing foundyet" as cause for pain. 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  #3 Had very high BS in ER>350. Pt says has been eating wrong foods at several cookouts and has frequent strawberry milkshakes.    Prior Medications Reviewed Using: Patient Recall  Updated Prior Medication List: GLUCOTROL XL 10 MG  TB24 (GLIPIZIDE) every am METFORMIN HCL 1000 MG  TABS (METFORMIN HCL) two times a day BENAZEPRIL HCL 5 MG TABS (BENAZEPRIL HCL) 1 tablet by mouth daily NOVOLOG MIX 70/30 FLEXPEN 70-30 %  SUSP (INSULIN ASPART PROT & ASPART) Inject 60  units in AM and 40  units in PM AMITRIPTYLINE HCL 25 MG TABS (AMITRIPTYLINE HCL) Take one tablet at bedtime TRAVATAN Z 0.004 %  SOLN (TRAVOPROST) instill 1 drop in each eye at bedtime *Robert Groat NYSTATIN-TRIAMCINOLONE 100000-0.1 UNIT/GM-%  OINT (NYSTATIN-TRIAMCINOLONE) 1 application to affected area  two times a day LIPITOR 80 MG  TABS (ATORVASTATIN CALCIUM) Take 1 tablet by mouth once a day for cholesterol. Prescribed to replace the crestor which his insurance won't cover. ROXICET 5-325 MG  TABS (OXYCODONE-ACETAMINOPHEN) Take 1-2 tablets by mouth every 6 hours as needed  (Dr.Tucich) * SPECTAZOLE CREAM Apply to gland of penis and surrounding foreskin x 10 days OXYCODONE-ACETAMINOPHEN 5-325 MG  TABS (OXYCODONE-ACETAMINOPHEN) 1-2 by mouth q 6 hours as needed pain CYCLOBENZAPRINE HCL 5 MG  TABS (CYCLOBENZAPRINE HCL) 1 by mouth three times a day x 5 days NEURONTIN 300 MG  CAPS (GABAPENTIN) Take one pill by mouth for 3 days then increase to 1 pill Am and PM for 3 days then increase to 1 pill every 8 hours LIDODERM 5 % PTCH (LIDOCAINE) Use 1 to 3 patches at area of greatest pain. 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  1960  Left foot surgery       2.  1968  Right arm fracture       3.  1999  Left toe fractures       4.  2002  Left eye mass surgery  Dr. Dione Booze       5.  03/2005  Colonoscopy  Dr. Ewing Schlein    Risk Factors:     Counseled to quit/cut down tobacco use:  yes HIV high-risk behavior:  no    Physical Exam  General:     Well-developed,well-nourished,in no acute distress;  alert,appropriate and cooperative throughout examination Skin:     Left axilla: Guaze tag removed with scant purulent discharge. No bleeding. No pain. Still with approx. a quarter-sized area of induration but no erythema/warmth/ or fluctuance. Area packed with approx. 1-2 inches of guaze strip. Pt had no pain with packing and depthwas about 2 cm.  Diabetes Management Exam:    Foot Exam (with socks and/or shoes not present):       Sensory-Monofilament:          Left foot: normal          Right foot: normal    Impression & Recommendations:  Problem # 1:  ABSCESS, AXILLA, LEFT (ICD-682.3) s/p I and D on 02/21/08. Re-packed loosely and patient instructed to pull small amount of tag out on 9/10 and 9/11. OK if all comes out.Is doing well. His updated medication list for this problem includes:    Septra Ds 800-160 Mg Tabs (Sulfamethoxazole-trimethoprim) .Marland Kitchen... 1 by mouth twice daily x 7days   Problem # 2:  DIABETES MELLITUS, TYPE II (ICD-250.00) Needs tighter BS control and Novo;ogmix flex pen is increased to 70 units in am and 50 units in pm.  His updated medication list for this problem includes:    Glucotrol Xl 10 Mg Tb24 (Glipizide) ..... Every am    Metformin Hcl 1000 Mg Tabs (Metformin hcl) .Marland Kitchen..Marland Kitchen Two times a day    Benazepril Hcl 5 Mg Tabs (Benazepril hcl) .Marland Kitchen... 1 tablet by mouth daily    Novolog Mix 70/30 Flexpen 70-30 % Susp (Insulin aspart prot & aspart) ..... 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  Complete Medication List: 1)  Glucotrol Xl 10 Mg Tb24 (Glipizide) .... Every am 2)  Metformin Hcl 1000 Mg Tabs (Metformin hcl) .... Two times a day 3)  Benazepril Hcl 5 Mg Tabs (Benazepril hcl) .Marland Kitchen.. 1 tablet by mouth daily 4)  Novolog Mix 70/30  Flexpen 70-30 % Susp (Insulin aspart prot & aspart) .... Inject 70  units in am and 50  units in pm 5)  Amitriptyline  Hcl 25 Mg Tabs (Amitriptyline hcl) .... Take one tablet at bedtime 6)  Travatan Z 0.004 % Soln (Travoprost) .... Instill 1 drop in each eye at bedtime *robert groat 7)  Nystatin-triamcinolone 100000-0.1 Unit/gm-% Oint (Nystatin-triamcinolone) .Marland Kitchen.. 1 application to affected area two times a day 8)  Lipitor 80 Mg Tabs (Atorvastatin calcium) .... Take 1 tablet by mouth once a day for cholesterol. prescribed to replace the crestor which his insurance won't cover. 9)  Roxicet 5-325 Mg Tabs (Oxycodone-acetaminophen) .... Take 1-2 tablets by mouth every 6 hours as needed  (dr.tucich) 10)  Spectazole Cream  .... Apply to gland of penis and surrounding foreskin x 10 days 11)  Oxycodone-acetaminophen 5-325 Mg Tabs (Oxycodone-acetaminophen) .Marland Kitchen.. 1-2 by mouth q 6 hours as needed pain 12)  Cyclobenzaprine Hcl 5 Mg Tabs (Cyclobenzaprine hcl) .Marland Kitchen.. 1 by mouth three times a day x 5 days 13)  Neurontin 300 Mg Caps (Gabapentin) .... Take one pill by mouth for 3 days then increase to 1 pill am and pm for 3 days then increase to 1 pill every 8 hours 14)  Lidoderm 5 % Ptch (Lidocaine) .... Use 1 to 3 patches at area of greatest pain. leave on 12 hours then off 12 hours 15)  Septra Ds 800-160 Mg Tabs (Sulfamethoxazole-trimethoprim) .Marland Kitchen.. 1 by mouth twice daily x 7days   Patient Instructions: 1)  Please schedule a follow-up appointment as needed.   Prescriptions: SEPTRA DS 800-160 MG TABS (SULFAMETHOXAZOLE-TRIMETHOPRIM) 1 by mouth twice daily x 7days  #14 x 0   Entered and Authorized by:   Beverley Fiedler MD   Signed by:   Beverley Fiedler MD on 02/23/2008   Method used:   [facility] to        [facility] [address] [phone]* (retail)       [address]       [place], [place]  [address]       Ph: [phone] or [phone]       Fax: [phone]   RxID:    [id number] NOVOLOG MIX 70/30 FLEXPEN 70-30 %  SUSP (INSULIN ASPART PROT & ASPART) Inject 70  units in AM and 50  units in PM  #1 month x 2   Entered and Authorized by:   Beverley Fiedler MD   Signed by:   Beverley Fiedler MD on 02/23/2008   Method used:   Electronically to        [facility] [address] [phone]* (retail)       [address]       [place], [place]  [address]       Ph: [phone] or [phone]       Fax: [phone]   RxID:   [id number]  ]  Last LDL:                                                 194 (06/10/2007 9:20:00 PM)        Diabetic Foot Exam Foot Inspection Is there a history of a foot ulcer?              No Is there a foot ulcer now?              No Can the patient see the bottom of  their feet?          Yes Are the shoes appropriate in style and fit?          Yes Is there swelling or an abnormal foot shape?          No Are the toenails long?                Yes Are the toenails thick?                Yes Are the toenails ingrown?              No Is there heavy callous build-up?              Yes Is there a claw toe deformity?                          No Is there elevated skin temperature?            No Is there limited ankle dorsiflexion?            Yes Is there foot or ankle muscle weakness?            No Do you have pain in calf while walking?           No      Comments: feet very dry, several heavy callus Set Next Diabetic Foot Exam here: 08/22/2008   10-g (5.07) Semmes-Weinstein Monofilament Test Performed by: Gaylyn Cheers RN          Right Foot          Left Foot Visual Inspection               Test Control      normal         normal Site 1         normal         normal Site 2         normal         normal Site 3         normal         normal Site 4         normal         normal Site 5         normal         normal Site 6         normal         normal Site 7         normal         normal Site 8          normal         normal Site 9         normal         normal Site 10         normal         normal  Impression      normal         normal    Laboratory Results   Blood Tests   Date/Time Received: February 23, 2008 11:37 AM  Date/Time Reported: February 23, 2008 11:37 AM   HGBA1C: greater than 14%   (Normal Range: Non-Diabetic - 3-6%   Control Diabetic - 6-8%) CBG Random:: 356

## 2010-07-17 NOTE — Assessment & Plan Note (Signed)
Summary: 2 DAY FU FROM [facility]//KT   Vital Signs:  Patient Profile:   70 Years Old Male Height:     73 inches Weight:      226.50 pounds Temp:     98.3 degrees F Pulse rate:   62 / minute Pulse rhythm:   regular Resp:     18 per minute BP sitting:   110 / 76  (left arm) Cuff size:   regular  Pt. in pain?   yes    Location:   right side head    Intensity:   8    Type:       dull  Vitals Entered By: Murvin Natal SMA (August 04, 2007 2:31 PM)              Is Patient Diabetic? Yes  CBG Result 509 CBG Device ID b Comments pt was given an rx fo Roxicet 5-325 to take for pain     Chief Complaint:  follow up from ED visit for headaches and medication given doesnt seem to help.Marland Kitchen  History of Present Illness: Here for persistent right-sided temporal headache. Pain never resolves and patient says pain is 8/10. No diplopia or loss of vision. Is occasionally blurry. No nausea or emesi with headache.No numbness or tingling. No h/o prior HA problems. No h/o head injury.No sinus congestion.Has some scant runnynose. Pt says was seen in [facility] ER and had blood tests and CAT scan(pt says scan was fine). [facility] records reviewed after pt left office. His head CT on 08/02/07 was normal. His CBC,ESR all normal. His CMET showed mildly low sodium and elevated glucose.    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  1960  Left foot surgery       2.  1968  Right arm fracture       3.  1999  Left toe fractures       4.  2002  Left eye mass surgery  Dr. Dione Booze       5.  03/2005  Colonoscopy  Dr. Ewing Schlein    Risk Factors:  Tobacco use:  current    Physical Exam  General:     Well-developed,well-nourished,in no acute distress; alert,appropriate and cooperative throughout examination Head:     Normocephalic and atraumatic without obvious abnormalities. NO tenderness to specific palpation over temporal areas Eyes:     No corneal or conjunctival inflammation noted. EOMI. Perrla. Funduscopic exam benign Ears:     External ear exam shows no significant lesions or deformities.  Otoscopic examination reveals  clear canals, tympanic membranes are intact bilaterally without bulging, retraction, inflammation or discharge. Hearing is grossly normal bilaterally. Lungs:     Normal respiratory effort, chest expands symmetrically. Lungs are clear to auscultation, no crackles or wheezes. Heart:     Normal rate and regular rhythm. S1 and S2 normal without gallop, murmur, click, rub or other extra sounds. Neurologic:     alert & oriented X3 and cranial nerves II-XII intact.      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 Pt is not tender over temporal artery to palpation but will check ESR. Pt to f/u next week if headache not resolved. His updated medication list for this problem includes:    Hydrocodone-acetaminophen 7.5-750 Mg Tabs (Hydrocodone-acetaminophen) .Marland Kitchen... Per dr.zeigler(podiatrist)    Ibuprofen 800 Mg Tabs (Ibuprofen) .Marland Kitchen... Take one tablet by mouth every 8 hours as needed for headache take with food  Orders: T- Sed rate non-auto (04540)   Problem # 2:  DIABETES MELLITUS, TYPE II (ICD-250.00) Uncontrolled and consequences d/w patient. He is to  increase his 70/30 insulin to 40 units Am and 30 units PM. He must f/u in 2-3 weeks for diabetes adjustment. His updated medication list for this problem includes:    Glucotrol Xl 10 Mg Tb24 (Glipizide) ..... Every am    Metformin Hcl 1000 Mg Tabs (Metformin hcl) .Marland Kitchen..Marland Kitchen Two times a day    Benazepril Hcl 5 Mg Tabs (Benazepril hcl) .Marland Kitchen... 1 tablet by mouth daily    Novolog Mix 70/30 Flexpen 70-30 % Susp (Insulin aspart prot & aspart) ..... Inject 40 units in am and 30 units in pm  Orders: Capillary Blood Glucose (82948) Fingerstick (98119)   Complete Medication List: 1)  Cialis 20 Mg Tabs (Tadalafil) .Marland Kitchen.. 1 by mouth prior to sexual activity, limit use to one in 24 hour period 2)  Glucotrol Xl 10 Mg Tb24 (Glipizide) .... Every am 3)  Crestor 40 Mg Tabs (Rosuvastatin calcium) .Marland Kitchen.. 1 once daily 4)  Metformin Hcl 1000 Mg Tabs (Metformin hcl) .... Two times a day 5)  Benazepril Hcl 5 Mg Tabs (Benazepril hcl) .Marland Kitchen.. 1 tablet by mouth daily 6)  Novolog Mix 70/30 Flexpen 70-30 % Susp (Insulin aspart prot & aspart) .... Inject 40 units in am and 30 units in pm 7)  Hydrocodone-acetaminophen 7.5-750 Mg Tabs (Hydrocodone-acetaminophen) .... Per dr.zeigler(podiatrist) 8)  Lyrica 100 Mg Caps (Pregabalin) .... Take 1 capsule by mouth every 12 hours(per dr.zeigler) 9)  Ibuprofen 800 Mg Tabs (Ibuprofen) .... Take one tablet by mouth every 8 hours as needed for headache take with food   Patient Instructions: 1)  Please schedule a follow-up appointment in 2 weeks. 2)  Return to see doctor if headache not gone in 1 week    Prescriptions: NOVOLOG MIX 70/30 FLEXPEN 70-30 %  SUSP (INSULIN ASPART PROT & ASPART) Inject 40 units in AM and 30 units in PM  #1 x 4   Entered and Authorized by:   Beverley Fiedler MD   Signed by:   Beverley Fiedler MD on 08/04/2007   Method used:   Print then Give to Patient   RxID:   [id number] IBUPROFEN 800 MG TABS (IBUPROFEN) Take one tablet by mouth every 8 hours as needed  for headache Take with food  #60 x 0   Entered and Authorized by:   Beverley Fiedler MD   Signed by:   Beverley Fiedler MD on 08/04/2007   Method used:   Print then  Give to Patient   RxID:   [id number]  ] Laboratory Results   Blood Tests     HGBA1C: >14.0%   (Normal Range: Non-Diabetic - 3-6%   Control Diabetic - 6-8%) CBG Random:: 509

## 2010-07-17 NOTE — Assessment & Plan Note (Signed)
Summary: fu on back pain///kt   Vital Signs:  Patient Profile:   70 Years Old Male Height:     73 inches Weight:      224 pounds BMI:     29.66 Temp:     97.0 degrees F Pulse rate:   76 / minute Pulse rhythm:   regular Resp:     18 per minute BP sitting:   126 / 80  (left arm) Cuff size:   large  Pt. in pain?   yes    Location:   back    Intensity:   10  Vitals Entered By: Vesta Mixer CMA (January 11, 2008 10:48 AM)              Is Patient Diabetic? Yes  CBG Result 304  Does patient need assistance? Ambulation Normal     Chief Complaint:  f/u back pain from shingles still hurting a lot .  History of Present Illness: Here for f/u on unusual back and flanf pain. Went to ER for same problem and was started on very low dose of neurontin at 100mg  once daily. Hasn't noticed any difference in pain level.  Pt doesn't recall any rash or blister but says he was told by an Urgent Care MD at either [facility] or [facility] that he had shingles(early June). He has had normal chest and rib xrays. He had a normal thoracic spine film. He has had persistent pain since eary June.    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  1960  Left foot surgery       2.  1968  Right arm fracture       3.  1999  Left toe fractures       4.  2002   Left eye mass surgery  Dr. Dione Booze       5.  03/2005  Colonoscopy  Dr. Ewing Schlein      Physical Exam  General:     Well-developed,well-nourished,in no acute distress; alert,appropriate and cooperative throughout examination Lungs:     Normal respiratory effort, chest expands symmetrically. Lungs are clear to auscultation, no crackles or wheezes. Heart:     Normal rate and regular rhythm. S1 and S2 normal without gallop, murmur, click, rub or other extra sounds. Msk:     Back/right flank: No rash noted.tender to palpation along right lateral ribcage to thoracic spine. Non-tender over thoracic spine to palpation.    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 MD d/w patient and the  challenges involved in trying to treat this type of pain. Increase the neurontin to 300mg  capsules take one capsule each day x 3 days then two times a day x 3 days then three times a day. Trial of Roxicet as needed pain. Trial of lidoderm patches to area of greatest pain.  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  Complete Medication List: 1)  Cialis 20 Mg Tabs (Tadalafil) .Marland Kitchen.. 1 by mouth prior to sexual activity, limit use to one in 24 hour period 2)  Glucotrol Xl 10 Mg Tb24 (Glipizide) .... Every am 3)  Metformin Hcl 1000 Mg Tabs (Metformin hcl) .... Two times a day 4)  Benazepril Hcl 5 Mg Tabs (Benazepril hcl) .Marland Kitchen.. 1 tablet by mouth daily 5)  Novolog Mix 70/30 Flexpen 70-30 % Susp (Insulin aspart prot & aspart) .... Inject 60  units in am and 40  units in pm 6)  Ibuprofen 800 Mg Tabs (Ibuprofen) .... Take one tablet by mouth every 8 hours as needed for headache take with food 7)  Amitriptyline Hcl 25 Mg Tabs (Amitriptyline hcl) .... Take one tablet at bedtime 8)  Travatan Z 0.004 % Soln (Travoprost) .... Instill 1 drop in each eye at bedtime *robert groat 9)   Nystatin-triamcinolone 100000-0.1 Unit/gm-% Oint (Nystatin-triamcinolone) .Marland Kitchen.. 1 application to affected area two times a day 10)  Lipitor 80 Mg Tabs (Atorvastatin calcium) .... Take 1 tablet by mouth once a day for cholesterol. prescribed to replace the crestor which his insurance won't cover. 11)  Roxicet 5-325 Mg Tabs (Oxycodone-acetaminophen) .... Take 1-2 tablets by mouth every 6 hours as needed  (dr.tucich) 12)  Spectazole Cream  .... Apply to gland of penis and surrounding foreskin x 10 days 13)  Diflucan 200 Mg Tabs (Fluconazole) .... Take 1 tablet by mouth once a day x 9 days 14)  Oxycodone-acetaminophen 5-325 Mg Tabs (Oxycodone-acetaminophen) .Marland Kitchen.. 1-2 by mouth q 6 hours as needed pain 15)  Cyclobenzaprine Hcl 5 Mg Tabs (Cyclobenzaprine hcl) .Marland Kitchen.. 1 by mouth three times a day x 5 days 16)  Neurontin 300 Mg Caps (Gabapentin) .... Take one pill by mouth for 3 days then increase to 1 pill am and pm for 3 days then increase to 1 pill every 8 hours 17)  Lidoderm 5 % Ptch (Lidocaine) .... Use 1 to 3 patches at area of greatest pain. leave on 12 hours then off 12 hours  Other Orders: Capillary Blood Glucose (82948) Fingerstick (16109)   Patient Instructions: 1)  We are scheduling you to see a chronic pain specialist for suspected post-herpatic neuralgia   Prescriptions: ROXICET 5-325 MG  TABS (OXYCODONE-ACETAMINOPHEN) Take 1-2 tablets by mouth every 6 hours as needed  (Dr.Tucich)  #60 x 0   Entered and Authorized by:   Beverley Fiedler MD   Signed by:   Beverley Fiedler MD on 01/11/2008   Method used:   Print then Give to Patient   RxID:   [id number] NOVOLOG MIX 70/30 FLEXPEN 70-30 %  SUSP (INSULIN ASPART PROT & ASPART) Inject 60  units in AM and 40  units in PM  #1 month x 1   Entered and Authorized by:   Beverley Fiedler MD   Signed by:   Beverley Fiedler MD on 01/11/2008   Method used:   Print then Give to Patient   RxID:   [id number] METFORMIN HCL 1000 MG  TABS  (METFORMIN HCL) two times a day  #60  x 5   Entered and Authorized by:   Beverley Fiedler MD   Signed by:   Beverley Fiedler MD on 01/11/2008   Method used:   Print then Give to Patient   RxID:   [id number] LIPITOR 80 MG  TABS (ATORVASTATIN CALCIUM) Take 1 tablet by mouth once a day for cholesterol. Prescribed to replace the crestor which his insurance won't cover.  #30 x 4   Entered and Authorized by:   Beverley Fiedler MD   Signed by:   Beverley Fiedler MD on 01/11/2008   Method used:   Print then Give to Patient   RxID:   [id number] LIDODERM 5 % PTCH (LIDOCAINE) Use 1 to 3 patches at area of greatest pain. Leave on 12 hours then off 12 hours  #1 month x 4   Entered and Authorized by:   Beverley Fiedler MD   Signed by:   Beverley Fiedler MD on 01/11/2008   Method used:   Print then Give to Patient   RxID:   [id number] NEURONTIN 300 MG  CAPS (GABAPENTIN) Take one pill by mouth for 3 days then increase to 1 pill Am and PM for 3 days then increase to 1 pill every 8 hours  #90 x 1   Entered and Authorized by:   Beverley Fiedler MD   Signed by:   Beverley Fiedler MD on 01/11/2008   Method used:   Print then Give to Patient   RxID:   [id number]  ]

## 2010-07-17 NOTE — Progress Notes (Signed)
Summary: diabectic testing supplies  Phone Note From Pharmacy   Summary of Call: Please call [facility] pharmacy needs new rx for  diabectic testing supplies [phone] ext [phone] Initial call taken by: Vesta Mixer CMA,  Oct 23, 2007 3:15 PM  Follow-up for Phone Call        just completed form and put on paper chart to be faxed back. Follow-up by: Lehman Prom FNP,  Oct 26, 2007 8:18 AM    FAXED 10/26/07

## 2010-07-17 NOTE — Progress Notes (Signed)
Summary: MEDICINE NOT HELPING  Phone Note Call from Patient Call back at Home Phone [phone]   Summary of Call: [name] PT. [name] SAYS THAT PAIN MEDICATION YOU GAVE HIM FOR HIS BACK, IS NOT WORKING. HE NEEDS SOMETHING ELSE. Initial call taken by: Leodis Rains,  December 28, 2007 3:13 PM  Follow-up for Phone Call        forward to provider Follow-up by: Mikey College CMA,  December 29, 2007 11:48 AM  Additional Follow-up for Phone Call Additional follow up Details #1::        Does he still have his ibuprofen 800mg  tablets? If so,then he can take the ibuprofen one tablet every 6-8 hours and his roxicet for pain. If he needs ibuprofen then call in a refill  #60 2 RF Additional Follow-up by: Beverley Fiedler MD,  December 30, 2007 11:57 AM    Additional Follow-up for Phone Call Additional follow up Details #2::    no answer...........................Marland KitchenMikey College CMA  December 30, 2007 12:43 PM   Spoke with [name] and he stated that today he was feeling much better and that he already had ibuprofen that he can take. He will let you know if anything changes. Follow-up by: Mikey College CMA,  January 01, 2008 10:51 AM

## 2010-07-19 ENCOUNTER — Emergency Department (HOSPITAL_COMMUNITY)
Admission: EM | Admit: 2010-07-19 | Discharge: 2010-07-19 | Disposition: A | Discharge status: Home / Self Care | Payer: MEDICARE | Attending: Emergency Medicine | Admitting: Emergency Medicine

## 2010-07-19 ENCOUNTER — Emergency Department (HOSPITAL_COMMUNITY): Payer: MEDICARE

## 2010-07-19 DIAGNOSIS — J3489 Other specified disorders of nose and nasal sinuses: Secondary | ICD-10-CM | POA: Insufficient documentation

## 2010-07-19 DIAGNOSIS — Z862 Personal history of diseases of the blood and blood-forming organs and certain disorders involving the immune mechanism: Secondary | ICD-10-CM | POA: Insufficient documentation

## 2010-07-19 DIAGNOSIS — R05 Cough: Secondary | ICD-10-CM | POA: Insufficient documentation

## 2010-07-19 DIAGNOSIS — E119 Type 2 diabetes mellitus without complications: Secondary | ICD-10-CM | POA: Insufficient documentation

## 2010-07-19 DIAGNOSIS — R059 Cough, unspecified: Secondary | ICD-10-CM | POA: Insufficient documentation

## 2010-07-19 DIAGNOSIS — J069 Acute upper respiratory infection, unspecified: Secondary | ICD-10-CM | POA: Insufficient documentation

## 2010-07-19 DIAGNOSIS — R0602 Shortness of breath: Secondary | ICD-10-CM | POA: Insufficient documentation

## 2010-07-19 DIAGNOSIS — Z8639 Personal history of other endocrine, nutritional and metabolic disease: Secondary | ICD-10-CM | POA: Insufficient documentation

## 2010-07-19 DIAGNOSIS — M129 Arthropathy, unspecified: Secondary | ICD-10-CM | POA: Insufficient documentation

## 2010-07-19 LAB — GLUCOSE, CAPILLARY: Glucose-Capillary: 110 mg/dL — ABNORMAL HIGH (ref 70–99)

## 2010-07-20 NOTE — Letter (Signed)
Summary: MAILED REQUESTED RECORDS TO [facility]   MAILED REQUESTED RECORDS TO [facility]   Imported By: Arta Bruce 07/28/2009 12:33:41  _____________________________________________________________________  External Attachment:    Type:   Image     Comment:   External Document

## 2010-08-27 LAB — POCT I-STAT, CHEM 8
BUN: 8 mg/dL (ref 6–23)
Calcium, Ion: 1.1 mmol/L — ABNORMAL LOW (ref 1.12–1.32)
Chloride: 104 mEq/L (ref 96–112)
Creatinine, Ser: 0.7 mg/dL (ref 0.4–1.5)
Glucose, Bld: 247 mg/dL — ABNORMAL HIGH (ref 70–99)
HCT: 42 % (ref 39.0–52.0)
Hemoglobin: 14.3 g/dL (ref 13.0–17.0)
Potassium: 3.7 mEq/L (ref 3.5–5.1)
Sodium: 138 mEq/L (ref 135–145)
TCO2: 22 mmol/L (ref 0–100)

## 2010-08-27 LAB — GLUCOSE, CAPILLARY: Glucose-Capillary: 180 mg/dL — ABNORMAL HIGH (ref 70–99)

## 2010-08-28 LAB — POCT I-STAT, CHEM 8
BUN: 15 mg/dL (ref 6–23)
Calcium, Ion: 1.03 mmol/L — ABNORMAL LOW (ref 1.12–1.32)
Chloride: 105 mEq/L (ref 96–112)
Creatinine, Ser: 0.9 mg/dL (ref 0.4–1.5)
Glucose, Bld: 184 mg/dL — ABNORMAL HIGH (ref 70–99)
HCT: 40 % (ref 39.0–52.0)
Hemoglobin: 13.6 g/dL (ref 13.0–17.0)
Potassium: 4.3 mEq/L (ref 3.5–5.1)
Sodium: 138 mEq/L (ref 135–145)
TCO2: 22 mmol/L (ref 0–100)

## 2010-08-28 LAB — CBC
HCT: 34.6 % — ABNORMAL LOW (ref 39.0–52.0)
Hemoglobin: 11.1 g/dL — ABNORMAL LOW (ref 13.0–17.0)
MCH: 27.6 pg (ref 26.0–34.0)
MCHC: 32.1 g/dL (ref 30.0–36.0)
MCV: 86.1 fL (ref 78.0–100.0)
Platelets: 268 10*3/uL (ref 150–400)
RBC: 4.02 MIL/uL — ABNORMAL LOW (ref 4.22–5.81)
RDW: 14.5 % (ref 11.5–15.5)
WBC: 5 10*3/uL (ref 4.0–10.5)

## 2010-08-28 LAB — GLUCOSE, CAPILLARY
Glucose-Capillary: 117 mg/dL — ABNORMAL HIGH (ref 70–99)
Glucose-Capillary: 150 mg/dL — ABNORMAL HIGH (ref 70–99)
Glucose-Capillary: 193 mg/dL — ABNORMAL HIGH (ref 70–99)

## 2010-08-28 LAB — DIFFERENTIAL
Basophils Absolute: 0 10*3/uL (ref 0.0–0.1)
Basophils Relative: 0 % (ref 0–1)
Eosinophils Absolute: 0.3 10*3/uL (ref 0.0–0.7)
Eosinophils Relative: 5 % (ref 0–5)
Lymphocytes Relative: 29 % (ref 12–46)
Lymphs Abs: 1.5 10*3/uL (ref 0.7–4.0)
Monocytes Absolute: 0.5 10*3/uL (ref 0.1–1.0)
Monocytes Relative: 10 % (ref 3–12)
Neutro Abs: 2.8 10*3/uL (ref 1.7–7.7)
Neutrophils Relative %: 55 % (ref 43–77)

## 2010-08-29 ENCOUNTER — Ambulatory Visit (HOSPITAL_COMMUNITY)
Admission: RE | Admit: 2010-08-29 | Discharge: 2010-08-29 | Disposition: A | Discharge status: Home / Self Care | Payer: MEDICARE | Source: Ambulatory Visit | Attending: General Surgery | Admitting: General Surgery

## 2010-08-29 ENCOUNTER — Encounter (HOSPITAL_BASED_OUTPATIENT_CLINIC_OR_DEPARTMENT_OTHER): Payer: MEDICARE | Attending: General Surgery

## 2010-08-29 ENCOUNTER — Other Ambulatory Visit (HOSPITAL_BASED_OUTPATIENT_CLINIC_OR_DEPARTMENT_OTHER): Payer: Self-pay | Admitting: General Surgery

## 2010-08-29 DIAGNOSIS — E1169 Type 2 diabetes mellitus with other specified complication: Secondary | ICD-10-CM | POA: Insufficient documentation

## 2010-08-29 DIAGNOSIS — M79609 Pain in unspecified limb: Secondary | ICD-10-CM | POA: Insufficient documentation

## 2010-08-29 DIAGNOSIS — L97509 Non-pressure chronic ulcer of other part of unspecified foot with unspecified severity: Secondary | ICD-10-CM | POA: Insufficient documentation

## 2010-08-29 DIAGNOSIS — M79673 Pain in unspecified foot: Secondary | ICD-10-CM

## 2010-08-29 DIAGNOSIS — L84 Corns and callosities: Secondary | ICD-10-CM | POA: Insufficient documentation

## 2010-09-05 LAB — GLUCOSE, CAPILLARY: Glucose-Capillary: 154 mg/dL — ABNORMAL HIGH (ref 70–99)

## 2010-09-07 LAB — DIFFERENTIAL
Basophils Absolute: 0.1 10*3/uL (ref 0.0–0.1)
Basophils Relative: 1 % (ref 0–1)
Eosinophils Absolute: 0.5 10*3/uL (ref 0.0–0.7)
Eosinophils Relative: 9 % — ABNORMAL HIGH (ref 0–5)
Lymphocytes Relative: 33 % (ref 12–46)
Lymphs Abs: 1.9 10*3/uL (ref 0.7–4.0)
Monocytes Absolute: 0.5 10*3/uL (ref 0.1–1.0)
Monocytes Relative: 8 % (ref 3–12)
Neutro Abs: 2.9 10*3/uL (ref 1.7–7.7)
Neutrophils Relative %: 50 % (ref 43–77)

## 2010-09-07 LAB — CBC
HCT: 41.3 % (ref 39.0–52.0)
Hemoglobin: 13.5 g/dL (ref 13.0–17.0)
MCHC: 32.6 g/dL (ref 30.0–36.0)
MCV: 88.3 fL (ref 78.0–100.0)
Platelets: 485 10*3/uL — ABNORMAL HIGH (ref 150–400)
RBC: 4.68 MIL/uL (ref 4.22–5.81)
RDW: 14.3 % (ref 11.5–15.5)
WBC: 5.8 10*3/uL (ref 4.0–10.5)

## 2010-09-07 LAB — BASIC METABOLIC PANEL
BUN: 10 mg/dL (ref 6–23)
CO2: 24 mEq/L (ref 19–32)
Calcium: 9.5 mg/dL (ref 8.4–10.5)
Chloride: 103 mEq/L (ref 96–112)
Creatinine, Ser: 0.83 mg/dL (ref 0.4–1.5)
GFR calc Af Amer: >60 mL/min (ref >60)
GFR calc non Af Amer: >60 mL/min (ref >60)
Glucose, Bld: 153 mg/dL — ABNORMAL HIGH (ref 70–99)
Potassium: 3.6 mEq/L (ref 3.5–5.1)
Sodium: 138 mEq/L (ref 135–145)

## 2010-09-09 LAB — GLUCOSE, CAPILLARY
Glucose-Capillary: 102 mg/dL — ABNORMAL HIGH (ref 70–99)
Glucose-Capillary: 125 mg/dL — ABNORMAL HIGH (ref 70–99)
Glucose-Capillary: 169 mg/dL — ABNORMAL HIGH (ref 70–99)
Glucose-Capillary: 70 mg/dL (ref 70–99)
Glucose-Capillary: 83 mg/dL (ref 70–99)
Glucose-Capillary: 84 mg/dL (ref 70–99)

## 2010-09-09 LAB — BASIC METABOLIC PANEL
BUN: 7 mg/dL (ref 6–23)
CO2: 27 mEq/L (ref 19–32)
Calcium: 9.9 mg/dL (ref 8.4–10.5)
Chloride: 100 mEq/L (ref 96–112)
Creatinine, Ser: 0.79 mg/dL (ref 0.4–1.5)
GFR calc Af Amer: >60 mL/min (ref >60)
GFR calc non Af Amer: >60 mL/min (ref >60)
Glucose, Bld: 142 mg/dL — ABNORMAL HIGH (ref 70–99)
Potassium: 4.3 mEq/L (ref 3.5–5.1)
Sodium: 132 mEq/L — ABNORMAL LOW (ref 135–145)

## 2010-09-09 LAB — CBC
HCT: 43.6 % (ref 39.0–52.0)
Hemoglobin: 14.4 g/dL (ref 13.0–17.0)
MCHC: 33.1 g/dL (ref 30.0–36.0)
MCV: 88.3 fL (ref 78.0–100.0)
Platelets: 319 10*3/uL (ref 150–400)
RBC: 4.94 MIL/uL (ref 4.22–5.81)
RDW: 14.5 % (ref 11.5–15.5)
WBC: 6.9 10*3/uL (ref 4.0–10.5)

## 2010-09-09 LAB — SURGICAL PCR SCREEN
MRSA, PCR: NEGATIVE
Staphylococcus aureus: POSITIVE — AB

## 2010-09-18 ENCOUNTER — Encounter (HOSPITAL_BASED_OUTPATIENT_CLINIC_OR_DEPARTMENT_OTHER): Payer: MEDICARE | Attending: General Surgery

## 2010-09-18 ENCOUNTER — Other Ambulatory Visit: Payer: Self-pay

## 2010-09-18 DIAGNOSIS — L84 Corns and callosities: Secondary | ICD-10-CM | POA: Insufficient documentation

## 2010-09-18 DIAGNOSIS — L97509 Non-pressure chronic ulcer of other part of unspecified foot with unspecified severity: Secondary | ICD-10-CM | POA: Insufficient documentation

## 2010-09-18 DIAGNOSIS — E1169 Type 2 diabetes mellitus with other specified complication: Secondary | ICD-10-CM | POA: Insufficient documentation

## 2010-09-19 LAB — GLUCOSE, CAPILLARY
Glucose-Capillary: 198 mg/dL — ABNORMAL HIGH (ref 70–99)
Glucose-Capillary: 289 mg/dL — ABNORMAL HIGH (ref 70–99)

## 2010-09-26 LAB — CBC
HCT: 43.1 % (ref 39.0–52.0)
Hemoglobin: 14.6 g/dL (ref 13.0–17.0)
MCHC: 33.8 g/dL (ref 30.0–36.0)
MCV: 87.3 fL (ref 78.0–100.0)
Platelets: 296 10*3/uL (ref 150–400)
RBC: 4.94 MIL/uL (ref 4.22–5.81)
RDW: 13.4 % (ref 11.5–15.5)
WBC: 5.9 10*3/uL (ref 4.0–10.5)

## 2010-09-26 LAB — GLUCOSE, CAPILLARY
Glucose-Capillary: 114 mg/dL — ABNORMAL HIGH (ref 70–99)
Glucose-Capillary: 137 mg/dL — ABNORMAL HIGH (ref 70–99)
Glucose-Capillary: 138 mg/dL — ABNORMAL HIGH (ref 70–99)
Glucose-Capillary: 148 mg/dL — ABNORMAL HIGH (ref 70–99)
Glucose-Capillary: 410 mg/dL — ABNORMAL HIGH (ref 70–99)

## 2010-09-26 LAB — BASIC METABOLIC PANEL
BUN: 8 mg/dL (ref 6–23)
CO2: 27 mEq/L (ref 19–32)
Calcium: 9.5 mg/dL (ref 8.4–10.5)
Chloride: 97 mEq/L (ref 96–112)
Creatinine, Ser: 0.77 mg/dL (ref 0.4–1.5)
GFR calc Af Amer: >60 mL/min (ref >60)
GFR calc non Af Amer: >60 mL/min (ref >60)
Glucose, Bld: 397 mg/dL — ABNORMAL HIGH (ref 70–99)
Potassium: 4.4 mEq/L (ref 3.5–5.1)
Sodium: 135 mEq/L (ref 135–145)

## 2010-10-30 NOTE — Assessment & Plan Note (Signed)
Mr. [name] follows up today.  He is a 70 year old male with back pain in  the midback area since March 2009.  He has told that he had shingles;  however, when I evaluated on February 15, 2008, no signs of any rash, no  dermatomal hyperalgesia.   He was sent for MRI of thoracic spine, felt that he probably had some  thoracic spondylosis, which was seen on x-rays in the ED.  His thoracic  MRI confirmed this.  It did not show any signs of any spinal cord  impingement.  In addition, there was diffused arthritic spurring at the  costovertebral joints.  He has had some decent relief from Flector patch  1 p.o. b.i.d. as well as hydrocodone 5/500 which he is taking twice a  day, but he actually sometimes takes 2 at a time.  His main pain is at  night, during the day he does okay with the Flector patch.  He had a  bridge prescription for 40 tablets and he states he takes twice a day,  but he actually has probably about 10 left.   His other comorbidities include diabetes on glipizide and metformin.  His Oswestry disability index is in the 12-20 range depending on whether  his back is giving problem at night.   PHYSICAL EXAMINATION:  He has limited range of motion of the thoracic  spine area, forward flexion, extension, lateral rotation and bending.  No pain over the lumbar area of the paraspinals.  He does have  tenderness along the thoracic spine T7-T12 area.  His lower extremity  strength is normal.  Lower extremity range of motion normal.  Normal  strength in the lower extremities.  Deep tendon reflexes are normal.   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  He does have some concomitant myofascial pain in that  area.  He has some costovertebral degenerative joint disease.   I would like for him to go to physical therapy to do some thoracic  expansion exercises, deep breathing, and overall trying to improve his  thoracic mobility.  This may cause some  temporary flare-up of his pain  symptoms.  We will gain some Flector patches to wear along his thoracic  paraspinal strength a day and during the evening, he can take 1-7.5  hydrocodone nightly.  I will see him back in one month to see how does  with physical therapy, may need to go to the 10 mg dosing of the  hydrocodone at bedtime, if the 7.5 is not adequate, but overall, I do  not anticipate him needing to be on 24 hour a day narcotic analgesics.      [name], M.D.  Electronically Signed     AEK/MedQ  D:  03/11/2008 15:40:58  T:  03/12/2008 05:24:51  Job #:  [id number]   cc:   Fanny Dance. Rankins, M.D.  Fax: [phone]

## 2010-10-30 NOTE — Op Note (Signed)
[name], [name]                ACCOUNT NO.:  [id number]   MEDICAL RECORD NO.:  [id number]          PATIENT TYPE:  AMB   LOCATION:  [facility]                         FACILITY:  [facility]   PHYSICIAN:  Bertram Millard. Dahlstedt, M.D.DATE OF BIRTH:  [birth year]   DATE OF PROCEDURE:  02/29/2008  DATE OF DISCHARGE:                               OPERATIVE REPORT   PREOPERATIVE DIAGNOSIS:  Phimosis.   POSTOPERATIVE DIAGNOSIS:  Phimosis.   PROCEDURE:  Circumcision.   ATTENDING PHYSICIAN:  Marcine Matar, M.D.   RESIDENT PHYSICIAN:  Delman Kitten, M.D.   ANESTHESIA:  Was MAC plus local.   INDICATIONS FOR PROCEDURE:  [name] is a 70 year old, African-American  male, poorly controlled diabetic who saw Dr. Retta Diones recently in  clinic.  He complained of a nonretractile foreskin and associated  discomfort and some urinary difficulty.  He was found to have  significant glucosuria and has poorly controlled diabetes which he was  also found on exam to have a very tight phimotic ring that was  nonretractile.  In discussion with the patient preoperatively, Dr.  Retta Diones recommended circumcision, and informed consent was obtained  after discussion was undertaken regarding risks, consequences, benefits,  and concerns.   PROCEDURE IN DETAIL:  The patient was brought to the operating room and  placed in supine position.  He was correctly identified by his  wristband, and appropriate time-out was taken.  IV antibiotics were  administered, and MAC anesthesia was delivered.  Once adequately  anesthetized, his groin was prepped and draped in normal sterile  fashion.  His foreskin was able to be retracted under anesthesia with  some difficulty.  Dorsal penile block was employed using 0.25% plain  Marcaine.  We then marked our proximal and distal incisions and carried  out circumcision using sleeve technique.  The mucosal collar incision  was made initially with scalpel, taken down through the dartos  layers  both bluntly and sharply.  We then did our proximal incision in the same  way.  We made a tunnel under the foreskin bluntly and in the 12 o'clock  position, connecting the 2 incisions, and then incised this sharply with  Metzenbaum scissors.  We then amputated our foreskin using Bovie  electrocautery.  All bleeding sites were addressed with Bovie  electrocautery, and then we reapproximated both wound edges initially  with 4 simple interrupted sutures using 4-0 chromic in quadrants, and  then we used a simple running suture in between the quadrants again with  4-0 chromic.  We then cleaned and reinspected the phallus.  There was no  bleeding.  The  wound edges were appropriately approximated.  We dressed the wound with  Vaseline gauze and Coban, and this marked the end of our procedure.  He  awoke from anesthesia and was taken to recovery room in stable  condition.  There were no complications.  Dr. Retta Diones was present and  participated in all aspects of the case.     ______________________________  Delman Kitten, M.D.      Bertram Millard. Dahlstedt, M.D.  Electronically Signed    DW/MEDQ  D:  02/29/2008  T:  02/29/2008  Job:  [id number]

## 2010-10-30 NOTE — Assessment & Plan Note (Signed)
A 70 year old male with history of thoracic pain since March 2009, now  he is having pain more in the lower back area.  He has been on oxycodone  in the past.  He has been on Lyrica and ibuprofen.  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  MRI of the thoracic spine showed no compressive lesions.  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   PHYSICAL EXAMINATION:  He has lower extremity pain, some reduced deep  tendon reflex at the ankle, right greater than left side.  Sensation,  nondermatomal changes.  Motor has full strength in lower extremities.   IMPRESSION:  Sciatica, left lower extremity.  We will go ahead and  schedule for epidural steroid at left L4-L5.  I will see him back.  We  will continue hydrocodone 7.5/325 mg 1 p.o. q.h.s. as well as Flector  patch.      [name], M.D.  Electronically Signed     AEK/MedQ  D:  05/09/2008 14:22:11  T:  05/10/2008 02:34:21  Job #:  [id number]

## 2010-10-30 NOTE — Assessment & Plan Note (Signed)
Mr. Conger returns today.  I last saw him approximately 2 weeks ago at  which time we did a left L4-L5 transforaminal lumbar epidural steroid  injection under fluoroscopic guidance.  He has a history of L4-L5 disc  protrusion with lumbar radiculitis, but really only symptomatic on the  left side.   Starting several days after the injection, he has had some increasing  left lower extremity pain and described as sharp and burning, constant  aching, worse at night.  His sleep is poor because of this.  He can walk  2 hours at a time, he can climb steps, he can drive, he is retired, no  longer does any type of employed activity but does his own house work  and is independent with every self-care skill.  His pain medication  gives him moderate relief of pain.  He can stand without limitations.  His main problems at night are when he tries to sleep.   He has had recent lumbar MRI showing no masses.  He does have moderate  multifactorial L4-L5 foraminal stenosis.  It is really worse on the  right than on the left side.   He has had a previous thoracic MRI done on February 17, 2008, showing  diffuse costovertebral joint arthritis, but this has been relatively  well controlled with Flexeril patch over that area.   His pain is currently 10/10.  His sleep is poor.   His lower extremity strength is 5/5 in the hip flexion, knee extension,  and ankle dorsiflexion.  Gait is normal.  He has no evidence of toe drag  or instability.  His back has tenderness in the lumbosacral paraspinal,  but this is only mild.  His lower extremity range of motion is normal.  He has no evidence of swelling.  No pain to palpation, no joint swelling  in lower extremities.   His vitals, blood pressure 134/77, pulse 101, respiratory rate 20, O2  sat 97% room air.   IMPRESSION:  1. 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  2. History of thoracic spondylosis.  3. Ankylosing vertebral  hyperostosis.   PLAN:  1. We will inject L4-L5 level but use translaminar rather than      transforaminal route.  2. Add gabapentin 300 mg at bedtime on to the hydrocortisone 0.5 at      bedtime.      [name], M.D.  Electronically Signed     AEK/MedQ  D:  05/23/2008 15:15:52  T:  05/24/2008 16:10:96  Job #:  [id number]

## 2010-10-30 NOTE — Procedures (Signed)
[name], [name]                ACCOUNT NO.:  [id number]   MEDICAL RECORD NO.:  [id number]           PATIENT TYPE:   LOCATION:                                 FACILITY:   PHYSICIAN:  [name], M.D.DATE OF BIRTH:  [birth year]   DATE OF PROCEDURE:  DATE OF DISCHARGE:                               OPERATIVE REPORT   PROCEDURE:  Left L4-L5 transforaminal lumbar epidural steroid injection  under fluoroscopic guidance.   INDICATION:  Lumbar radiculitis with L4-L5 disk protrusion.   Pain is only partially responsive to medication management, this is  conservative care.   Informed consent was obtained after describing the risks and benefits of  the procedure with the patient.  These include bleeding, bruising, and  infection.  He elects to proceed and has given written consent.  The  patient placed prone on fluoroscopy table.  Betadine prep, sterilely  draped.  A 25-gauge, 1-1/2-inch needle was used to anesthetize the skin  and subcutaneous tissue, 1% lidocaine x2 mL, and 22-gauge, 3-1/2-inch  spinal needle was inserted under fluoroscopic guidance starting at left  L4-L5 intervertebral foramen.  AP, lateral, and oblique imaging  utilized.  Omnipaque 180 under live fluoro demonstrated good epidural  spread as well as nerve root outline followed by injection of 1 mL of 10  mg/mL dexamethasone and 2 mL of 1% MPF lidocaine.  The patient tolerated  the procedure well.  Pre and post injection vitals stable.  Post  injection instructions given.  Pre injection pain level 10/10.  Post  injection 6/10.  I will see him back in 1 month to see how he does over  time consider repeat if need be.      [name], M.D.  Electronically Signed     AEK/MEDQ  D:  05/09/2008 14:19:26  T:  05/10/2008 04:35:20  Job:  [id number]

## 2010-10-30 NOTE — Op Note (Signed)
NAMEHERVE, HAUG                ACCOUNT NO.:  [id number]   MEDICAL RECORD NO.:  [id number]          PATIENT TYPE:  OIB   LOCATION:  3526                         FACILITY:  [facility]   PHYSICIAN:  Danae Orleans. Venetia Maxon, M.D.  DATE OF BIRTH:  1940-07-18   DATE OF PROCEDURE:  09/20/2008  DATE OF DISCHARGE:  09/20/2008                               OPERATIVE REPORT   PREOPERATIVE DIAGNOSIS:  Left L4-5 stenosis with herniated lumbar disk  spondylosis and radiculopathy.   POSTOPERATIVE DIAGNOSIS:  Left L4-5 stenosis with herniated lumbar disk  spondylosis and radiculopathy.   PROCEDURE:  1. Left L4-5 laminoforaminotomy with microdiskectomy.  2. Microdissection.   SURGEON:  Danae Orleans. Venetia Maxon, MD   ASSISTANT:  Hilda Lias, MD   ANESTHESIA:  General endotracheal anesthesia.   ESTIMATED BLOOD LOSS:  Minimal.   COMPLICATIONS:  None.   DISPOSITION:  Recovery.   INDICATIONS:  [name] is a 70 year old diabetic man with left L5  radiculopathy.  He has a herniated disk with caudally migrated disk  material at the L4-5 level on the left which accompanied by significant  lateral recess stenosis.  It appears to be causing significant L5 nerve  root compression as identified on the recent lumbar MRI scan.  The  patient is not improving with conservative measures and is requiring  substantial narcotic medication to function.  It was elected to take him  to surgery for laminoforaminotomy and microdiskectomy.   PROCEDURE:  Mr. Lybrand was brought to the operating room.  Following a  satisfactory and uncomplicated induction of general endotracheal  anesthesia and placement of intravenous lines, the patient was placed in  the prone position on the operating table.  His low back was then  prepped and draped in the usual sterile fashion.  The area of planned  incision was infiltrated with local lidocaine.  An incision was made  overlying the L4-5 interspace, carried to the lumbodorsal fascia and  was  incised sharply on the left side of midline.  A subperiosteal dissection  was performed using a Cobb elevator exposing what was felt to be the L4-  5 interspace.  Intraoperative x-ray was obtained with a marker probe at  the L4-5 level and more cephalad at L3-4.  Subsequently, on confirmation  with intraoperative x-ray, further exposure was performed to expose the  L4-5 interspace.  A high-speed drill was used to perform a laminotomy of  L4 and then this was completed with Kerrison rongeurs and a generous  foraminotomy was performed over the superolateral aspect of the L5.  The  microscope was brought into field.  Using microdissection technique, the  ligamentum flavum was detached and removed in a piecemeal fashion.  The  thecal sac was decompressed as was the lateral recess, significantly  hypertrophied ligamentous material and a foraminotomy was performed  overlying the L5 nerve root, so that a coronary dilator could easily be  inserted out the neural foramen.  After mobilizing the L5 nerve root and  thecal sac medially, there was some spondylitic disk material which was  caudally migrated and causing compression of the L5  nerve root and this  disk material was removed with micropituitary.  The disk space itself  was not entered as it was felt not to be causing severe nerve root  compression and there did not appear to be any significant amount of  loose disk material.  After this decompressive surgery, it was felt that  the thecal sac and L5 nerve roots were well and widely decompressed.  Additionally, some the redundant ligamentous tissue was removed with up-  angled curettes and Kerrison rongeurs.  Wound was irrigated.  Soft  tissues were inspected and found to be good repair.  The laminotomy site  was bathed in fentanyl.  The self-retaining retractor was removed.  Microscope was taken out of the field.  The lumbodorsal fascia was  closed with 0 Vicryl sutures.  Subcutaneous  tissues were reapproximated  with 2-0 Vicryl interrupted inverted sutures and skin edges were  reapproximated with 3-0 Vicryl subcuticular stitch.  The wound was  dressed with Dermabond.  The patient was extubated in the operating room  and taken to the recovery room in stable satisfactory condition, having  tolerated his operation well.  Counts were correct at the end of the  case.      Danae Orleans. Venetia Maxon, M.D.  Electronically Signed     JDS/MEDQ  D:  09/20/2008  T:  09/20/2008  Job:  [id number]

## 2010-10-30 NOTE — Procedures (Signed)
NAMEJERMAYNE, [name]                ACCOUNT NO.:  [id number]   MEDICAL RECORD NO.:  [id number]          PATIENT TYPE:  EMS   LOCATION:  MAJO                         FACILITY:  [facility]   PHYSICIAN:  [name], M.D.DATE OF BIRTH:  [birth year]   DATE OF PROCEDURE:  06/27/2008  DATE OF DISCHARGE:  06/09/2008                               OPERATIVE REPORT   PROCEDURE:  Left L4-5 transforaminal lumbar epidural steroid injection  under fluoroscopic guidance.   INDICATIONS:  Lumbar radiculitis with pain going below the knee to the  foot area.  MRI showing some lumbar stenosis.  His pain in the back was  improved after a translaminar injection performed on May 31, 2008,  but has persistent left lower extremity pain.   DESCRIPTION OF PROCEDURE:  Informed consent was obtained after  describing risks and benefits of the procedure with the patient,  bleeding, bruising, infection, loss of bowel and bladder function,  temporary, or permanent paralysis.  He elects to proceed and has given  written consent.  The patient placed prone on fluoroscopy table.  Betadine prep, sterile drape 25-gauge 1-1/2 needle was used to  anesthetize the skin and subcu tissue, 1% lidocaine x2 mL, and a 22-  gauge, 3-1/2-inch spinal needle was inserted under fluoroscopic guidance  targeting left L4-5 intervertebral foramen.  AP, lateral, and oblique  imaging utilized.  Omnipaque 180 under live fluoro demonstrated no  intravascular uptake with a good epidural and nerve root spread.  This  followed by injection of 1 mL of 10 mg/mL dexamethasone, 1 mL of 1% MPF  lidocaine.  The patient tolerated procedure well.  Pre and Post  injection vitals stable.  Post injection instructions given.  Preinjection pain level in the leg 5/10.  Postinjection pain level in  the leg 4/10.  We will see him back in about 68-month for repeat  epidural.      [name], M.D.  Electronically Signed     AEK/MEDQ  D:   06/27/2008 10:40:38  T:  06/28/2008 01:53:32  Job:  [id number]

## 2010-10-30 NOTE — Procedures (Signed)
[name], [name]                ACCOUNT NO.:  [id number]   MEDICAL RECORD NO.:  [id number]           PATIENT TYPE:   LOCATION:                                 FACILITY:   PHYSICIAN:  [name], M.D.DATE OF BIRTH:  1940/07/06   DATE OF PROCEDURE:  05/09/2008  DATE OF DISCHARGE:                               OPERATIVE REPORT   PROCEDURE:  Left sacroiliac injection under fluoroscopic guidance.   INDICATION:  Left-sided back and hip and buttock pain only partially  relieved by medication management including narcotic analgesic  medications.  Pain interfere with self-care mobility.   Informed consent was obtained after describing the risks and benefits  procedure to the patient.  These include bleeding, bruising, and  infection.  She elects to proceed and has given written consent.   The patient placed prone on fluoroscopy table.  Betadine prep, sterile  drape, 25-gauge 1-1/2 needle was used to anesthetize the skin and  subcutaneous tissue, 1% lidocaine x2 mL and 25-gauge 3-inch spinal  needle was inserted under fluoroscopic guidance under AP, lateral, and  oblique imaging.  Omnipaque 180 under live fluoro demonstrated good  sacroiliac joint spread, negative intravascular uptake followed by  injection of 0.5 mL of 40 mg/mL Depo-Medrol in 1 mL of 2% MPF lidocaine.  The patient tolerated the procedure well.  Post injection instructions  given.  Repeat in 1 month.      [name], M.D.  Electronically Signed     AEK/MEDQ  D:  05/09/2008 14:39:47  T:  05/10/2008 04:36:03  Job:  [id number]

## 2010-10-30 NOTE — Procedures (Signed)
[name], [name]                ACCOUNT NO.:  [id number]   MEDICAL RECORD NO.:  [id number]           PATIENT TYPE:   LOCATION:                                 FACILITY:   PHYSICIAN:  [name], M.D.DATE OF BIRTH:  [birth year]   DATE OF PROCEDURE:  DATE OF DISCHARGE:                               OPERATIVE REPORT   A 70 year old male with ankylosing vertebral hyperostosis mainly  affecting thoracic spine.  He has had some lumbar stenosis particularly  at L4-5 level, central stenosis.  He underwent further workup including  MRI, last fall showed some right L4-5 foraminal narrowing.  He was  referred to Neurosurgery, underwent L4-5 microdiskectomy and he has been  recovering quite well.  He has also had some left knee pain responding  to knee injection.   His main complaint is right shoulder pain, feels like it is around the  shoulder blade and goes into the inferior aspect of the axilla around  the mid axillary line.  He has had no trauma.  He has no neck pain, no  upper extremity weakness or numbness.  He remains independent with his  self-care and mobility.  His pain is causing a constant aching in that  area.  He can walk without restriction.  He can climbs steps.  He  drives.  He is retired.   REVIEW OF SYSTEMS:  Positive for blood sugar regulation problems due to  his diabetes.   SOCIAL HISTORY:  Single, lives with friend, smokes a pipe.   PHYSICAL EXAMINATION:  VITAL SIGNS:  His blood pressure is 127/85, pulse  85, respirations 18, O2 sat 99% on room air.  His upper extremity  strength is 5/5 shoulder has negative impingement signs.  NECK:  Full range of motion, negative Spurling's maneuver.  Upper  extremity strength and range of motion are normal.  He has tenderness to  palpation around the upper medial scapular border, as well as the mid  medial scapular border.  He has no tenderness to palpation around his  axilla, no nodes are palpated.  No skin rashes are  appreciated.  He has  no evidence of atrophy around the shoulder girdle.   IMPRESSION:  1. Myofascial pain in right trapezius and rhomboids.  2. Lumbar post laminectomy syndrome.  He is actually doing quite well      in terms of his back pain, really not using his narcotic analgesics      much anymore.  We will therefore trial him on some tramadol for the      more moderate residual pain.   In regards to the shoulder, we will do trigger point injections around  the rhomboid and trapezius muscles and failing this, may need to send to  physical therapy for myofascial release.  Discussed with the patient and  agrees with plan.      [name], M.D.  Electronically Signed     AEK/MEDQ  D:  02/07/2009 13:20:18  T:  02/08/2009 06:40:20  Job:  [id number]   cc:   Danae Orleans. Venetia Maxon, M.D.  Fax: [phone]

## 2010-10-30 NOTE — Group Therapy Note (Signed)
[name] is a 70 year old male who has had pain in his mid back area  since March 2009.  He states he was told he has shingles and went back  to the ED visit back in March 2009 and no rash was noted in that area.  The patient states he has had no prior history of shingles.  He also has  a diabetic neuropathy, has diabetic shoes, and has been treated for  this.  He has had no trauma.  No motor vehicle accident.  He had a  thoracic spine on December 10, 2007, showing diffuse degenerative changes  and diffuse idiopathic skeletal hyperostosis.  He had rib films  performed showing no rib fractures, which was in June 2009.   His pain described as sharp, burning, intermittent dull, stabbing,  constant, tingling, aching; basically all descriptors checked.  He  states that it interferes a lot with activity, fairly consistent  throughout the day.  He states before he could 2 hours at a time, climb  steps, and drive.  He states that sitting position is worse than  standing.  He also has some tingling in his feet.  He complains of  weight loss.  He has not been trying to diet.  He has had some blood  sugar problems related to diabetes.   SOCIAL HISTORY:  Single, lives with son.   FAMILY HISTORY:  Significant for diabetes.   MEDICATIONS:  1. Lidoderm patch.  2. Gabapentin 300 mg t.i.d.  3. Oxycodone 1-2 p.o. q.6 h. p.r.n., he still has 31 left.  His last      oxycodone dose was yesterday.  4. Ciprofloxacin.  5. He is on Lyrica 100 mg p.o. b.i.d., but actually last took on      February 02, 2008.  6. Cyclobenzaprine 5 mg t.i.d.  7. Glipizide XL 10 mg daily.  8. Ibuprofen 800 mg q.8 h.  9. Crestor 40 mg p.o. daily.  10.Lipitor 80 mg daily.  11.Metformin 1000 mg b.i.d.   PHYSICAL EXAMINATION:  VITAL SIGNS:  Blood pressure 132/75, pulse 71,  respirations 18, and O2 saturation is 98% on room air.  GENERAL:  Elderly male in no acute distress.  NEURO:  Cranial nerves II through XII intact.  NECK:  A  75% range of motion forward flexion, extension, lateral  rotation, and bending.  His motor strength is 5/5 bilateral deltoid,  biceps, triceps, grip as well as hip flexion, knee extension, and ankle  dorsiflexion.  Deep tendon reflexes are 2+ bilaterally, biceps, triceps,  brachioradialis, knee and ankle.  Sensation normal in bilateral upper  and lower extremity pin prick in terms of feeling it but when I compared  his toes to his knees, he did have a stocking distribution decreased  sensation to pain.   No evidence of intrinsic atrophy.   His back had no signs of rashes.  He had multiple nevi.  He had  tenderness to palpation along the thoracic spinous processes but also in  the paraspinal muscles as well as the parascapular musculature.  His  lower back has tenderness only at L3 and above.  Upper and lower  extremities strength are normal.  Upper and lower extremities range of  motion normal.  Spine range of motion reduced in twisting, flexion,  extension, and lateral bending.   IMPRESSION:  Thoracic spondylosis.  He has limited range of motion due  to his diffuse idiopathic skeletal hyperostosis.  He has mild fascial  pain associated with the decreased  skeletal mobility.   Given his weight loss and his some progression of pain over time, I  think it is reasonable for check MRI of the thoracic spine.   I do not think he has shingles, at least does not appear to be,  primarily neuropathic pain appears to be more musculoskeletal.  I will  check him back after the MRI, we will send him for physical therapy, and  at this point, try  Flexor patch,  that is about the only thing he has  not tried and might be helpful.   Thank you for this interesting consultation.      [name], M.D.  Electronically Signed     AEK/MedQ  D:  02/15/2008 16:54:25  T:  02/16/2008 16:10:96  Job #:  [id number]   cc:   Fanny Dance. Rankins, M.D.  Fax: [phone]

## 2010-10-30 NOTE — Procedures (Signed)
NAMEROHITH, FAUTH                ACCOUNT NO.:  [id number]   MEDICAL RECORD NO.:  [id number]           PATIENT TYPE:   LOCATION:                                 FACILITY:   PHYSICIAN:  [name], M.D.DATE OF BIRTH:  [birth year]   DATE OF PROCEDURE:  DATE OF DISCHARGE:                               OPERATIVE REPORT   This is a left L4-5 translaminar lumbar epidural steroid injection under  fluoroscopic guidance.   INDICATIONS:  Lumbar radiculitis, has MRI showing stenosis at that level  with left lower extremity sciatic-type symptoms that are only partially  responsive to medication management and other conservative care and  interferes with self-care and mobility.   Informed consent was obtained after describing risks and benefits of  procedure with the patient.  These include bleeding, bruising,  infection.  He elects to proceed, and he has given written consent and  proceeds.   The patient placed prone on fluoroscopy table.  Betadine prep, sterile  drape.  A 25-gauge inch and a half needle was used to anesthetize the  skin and subcu tissue, 1% lidocaine x2 mL.  Then, an 18-gauge Hustead  needle was inserted under fluoroscopic guidance targeting the L4-5  interlaminar space, left paramedian approach.  AP and lateral images  utilized.  Omnipaque 180 under live fluoro demonstrated no intravascular  uptake after achieving possible loss of resistance with 50:50 air/saline  mix.  This was followed by injection of 2 mL of 40 mg/mL Depo-Medrol, 2  mL of 1% MPF lidocaine.  The patient tolerated the procedure well.  Pre  and postinjection vitals stable.  Postinjection instructions given.  Preinjection pain level 10/10, postinjection pain level 5/10.      [name], M.D.  Electronically Signed     AEK/MEDQ  D:  05/30/2008 10:46:17  T:  05/31/2008 00:24:38  Job:  [id number]

## 2010-10-30 NOTE — Assessment & Plan Note (Signed)
The patient is a 70 year old male with ankylosing vertebral hyperostosis  affecting mainly the thoracic spine.  He also has a lumbar stenosis  which is multifactorial, particularly at L4-5 level.  He has a central  stenosis greater than moderate, but worse on the right side than on the  left.  He does have more left lower extremity pain which is mainly in  the anterior shin area.  He has responded at least temporarily to left  L4-5 translaminar and transforaminal epidural steroid injections.  His  main complaint is left lower extremity pain at night.  He does take  gabapentin for this at night.  He also takes hydrocodone 7.5 mg nightly,  but this is not strong enough per his report.   He has had no falls.  He has had no other decline in his function.   His past medical history is significant for diabetes mellitus.  He also  has hyperlipidemia.   PHYSICAL EXAMINATION:  GENERAL:  No acute distress.  Mood and affect  appropriate.  His pain level today is 10/10.  Orientation x3.  Affect  alert.  Gait is normal.  VITAL SIGNS:  His blood pressure is 119/76, pulse 98, respirations 18,  O2 sat 98% on room air.   He has no evidence toe drag or knee instability.  He has good lower  extremity strength on manual muscle testing rated at 5/5 in hip flexion,  knee extension, ankle dorsiflexion.   His deep tendon reflexes are 1+ at the left knee and ankle, 2+ on the  right knee and ankle with facilitation.   Hip, knee, ankle range of motion are good.  He has no evidence of knee  effusion.  He has no evidence of sensory deficits on pinprick other than  on the anterior shin on the left side.  No stocking-glove distribution,  pinprick loss.   IMPRESSION:  Lumbar stenosis.  While imaging studies show worse on the  right at the inferior recess at L4-5, he is more symptomatic on the left  and has had improvements with epidural steroid injections targeting the  left side at L4-5 level.  This has been  temporary, and therefore I will  refer him to Dr. Venetia Maxon from [facility] to further evaluate  surgical options for this patient.   In the meantime, we will continue his gabapentin as well as increase his  hydrocodone to 10/325 nightly.  I will see him back in 2 months to  reevaluate.  The patient may potentially need EMG as well.      [name], M.D.  Electronically Signed     AEK/MedQ  D:  07/25/2008 09:05:52  T:  07/25/2008 22:56:29  Job #:  [id number]

## 2010-11-02 NOTE — Op Note (Signed)
[name], [name]                ACCOUNT NO.:  [id number]   MEDICAL RECORD NO.:  [id number]          PATIENT TYPE:  AMB   LOCATION:  ENDO                         FACILITY:  [facility]   PHYSICIAN:  Petra Kuba, M.D.    DATE OF BIRTH:  09-Apr-1941   DATE OF PROCEDURE:  03/27/2005  DATE OF DISCHARGE:                                 OPERATIVE REPORT   PROCEDURE PERFORMED:  Colonoscopy.   ENDOSCOPIST:  Petra Kuba, M.D.   INDICATIONS FOR PROCEDURE:  Patient with guaiac positivity, some bloating.  History of colon polyps.  Consent was signed after the risks, benefits,  methods and options were thoroughly discussed in the office.   MEDICINES USED:  Demerol 50 mg, Versed 6 mg.   DESCRIPTION OF PROCEDURE:  Rectal inspection was pertinent for external  hemorrhoids, small.  Digital exam was negative.  A video colonoscope was  inserted and easily advanced around the colon to the cecum.  This did not  require any abdominal pressure or any position changes.  No abnormalities  were seen on insertion.  The cecum was identified by the appendiceal orifice  and the ileocecal valve.  It did not require any abdominal pressure or any  position changes to get to the cecum.  In fact the scope was inserted a  short ways into the terminal ileum, which was normal.  No blood was seen  coming from above.  The scope was slowly withdrawn.  The prep was adequate.  There was minimal stool that required washing and suctioning.  On slow  withdrawal through the colon, no abnormalities were seen.  Specifically no  polyps, tumors, masses or diverticula or any signs of bleeding.  Once back  in the rectum, anorectal pullthrough and retroflexion confirmed some small  hemorrhoids.  Scope was straightened and readvanced a short ways up the left  side of the colon, air was suctioned, scope removed.  The patient tolerated  the procedure well.  There was no immediate obvious complication.   ENDOSCOPIC DIAGNOSIS:  1.   Internal and external hemorrhoids.  2.  Otherwise within normal limits to the terminal ileum without any blood      being seen.   PLAN:  Repeat screening in five years.  Follow-up p.r.n. or in six weeks to  recheck guaiacs, symptoms, possibly CBC and make sure no further work-up  plans are needed.           ______________________________  Petra Kuba, M.D.     MEM/MEDQ  D:  03/27/2005  T:  03/27/2005  Job:  [id number]   cc:   Clydie Braun L. Hal Hope, M.D.  Fax: [phone]

## 2010-11-26 ENCOUNTER — Ambulatory Visit: Payer: Medicare Other | Admitting: Physical Medicine & Rehabilitation

## 2010-11-27 ENCOUNTER — Ambulatory Visit (HOSPITAL_COMMUNITY)
Admission: RE | Admit: 2010-11-27 | Discharge: 2010-11-27 | Disposition: A | Discharge status: Home / Self Care | Payer: Medicare Other | Source: Ambulatory Visit | Attending: Physical Medicine & Rehabilitation | Admitting: Physical Medicine & Rehabilitation

## 2010-11-27 ENCOUNTER — Ambulatory Visit: Payer: Medicare Other | Admitting: Physical Medicine & Rehabilitation

## 2010-11-27 ENCOUNTER — Encounter: Payer: Medicare Other | Attending: Physical Medicine & Rehabilitation

## 2010-11-27 ENCOUNTER — Other Ambulatory Visit: Payer: Self-pay | Admitting: Physical Medicine & Rehabilitation

## 2010-11-27 DIAGNOSIS — M79609 Pain in unspecified limb: Secondary | ICD-10-CM | POA: Insufficient documentation

## 2010-11-27 DIAGNOSIS — M961 Postlaminectomy syndrome, not elsewhere classified: Secondary | ICD-10-CM

## 2010-11-27 DIAGNOSIS — L84 Corns and callosities: Secondary | ICD-10-CM | POA: Insufficient documentation

## 2010-11-27 DIAGNOSIS — M25579 Pain in unspecified ankle and joints of unspecified foot: Secondary | ICD-10-CM

## 2010-11-27 DIAGNOSIS — R209 Unspecified disturbances of skin sensation: Secondary | ICD-10-CM

## 2010-11-27 DIAGNOSIS — E1149 Type 2 diabetes mellitus with other diabetic neurological complication: Secondary | ICD-10-CM | POA: Insufficient documentation

## 2010-11-27 DIAGNOSIS — E1142 Type 2 diabetes mellitus with diabetic polyneuropathy: Secondary | ICD-10-CM | POA: Insufficient documentation

## 2010-11-27 DIAGNOSIS — M79673 Pain in unspecified foot: Secondary | ICD-10-CM

## 2010-11-28 ENCOUNTER — Encounter (HOSPITAL_BASED_OUTPATIENT_CLINIC_OR_DEPARTMENT_OTHER): Payer: Medicare Other | Attending: Plastic Surgery

## 2010-11-28 DIAGNOSIS — E1169 Type 2 diabetes mellitus with other specified complication: Secondary | ICD-10-CM | POA: Insufficient documentation

## 2010-11-28 DIAGNOSIS — L97509 Non-pressure chronic ulcer of other part of unspecified foot with unspecified severity: Secondary | ICD-10-CM | POA: Insufficient documentation

## 2010-11-28 DIAGNOSIS — I771 Stricture of artery: Secondary | ICD-10-CM | POA: Insufficient documentation

## 2010-11-28 DIAGNOSIS — L84 Corns and callosities: Secondary | ICD-10-CM | POA: Insufficient documentation

## 2010-11-28 NOTE — Assessment & Plan Note (Signed)
REASON FOR VISIT:  Right lower extremity pain.  HISTORY:  A 70 year old male with history of lumbar spinal stenosis as well as diabetic neuropathy.  He [facility] on July 25, 2008.  He was on gabapentin and hydrocodone just at night.  He underwent L4-5 microdiskectomy foraminotomy on September 20, 2008, per Dr. Venetia Maxon.  Postoperatively, had some myofascial pain, responded to trigger point injections, treated with tramadol and Voltaren gel when I last saw him on April 03, 2009. Subsequently, he has undergone right L3-4 and right L4-5 laminoforaminotomies per Dr. Venetia Maxon.  He has been followed up once again in [facility], advised to have HBO, but could not get insurance approval.  He was on doxycycline at one time.  He sees Podiatry for foot calluses.  His pain is on the lateral border of the right foot.  His last x-ray was on August 29, 2010, which showed postoperative changes in the head and first metatarsal, degenerative changes at that area, degenerative changes in the midfoot, but nothing on the lateral border of the foot.  His pain has been coming on over the last couple of months.  I reviewed these x-rays myself.  I did not see any evidence of a Jones fracture or any toe or med metatarsal fracture and his previous pinning metatarsal first MTP head.  He had a left foot MRI in October showing osteomyelitis, great toe.  Pain he states is in the 10/10 range, but he is still able to ambulate. He denies any recent trauma.  FUNCTIONAL STATUS:  Independent.  He can climb steps.  He can drive.  REVIEW OF SYSTEMS:  Positive for calluses on his foot, otherwise negative.  No drainage.  SOCIAL HISTORY:  Single.  Smokes a pipe.  No alcohol use.  FAMILY HISTORY:  Diabetes.  His own personal history is positive for diabetes as well.  MEDICATIONS:  Glipizide XL, ibuprofen, Crestor, metformin, insulin 70/30.  PHYSICAL EXAMINATION:  VITAL SIGNS:   Blood pressure 137/79, pulse 70, respirations 18, and O2 sat 99% on room air. MUSCULOSKELETAL:  His feet show no evidence of skin breakdown.  He does have calluses on the plantar surface of the left foot as well as the heel on the left side.  Right side, there is callus over the MTP #1 head.  He has no swelling in the foot.  No erythema.  He has mild tenderness over the little toe as well as lateral border of the foot, left side as well as on the right side, as well as the heel on the right side, as well as the Achilles on the right side.  Sensory testing is reduced throughout bilateral feet, although more so on the right L5 dermatome than on the right S1 dermatome.  His back has no tenderness to palpation.  Straight leg raising test is negative.  IMPRESSION:  Right lateral foot pain, but also with tenderness that is rather diffuse throughout the foot.  No obvious signs of erythema or swelling.  No acute signs of trauma.  No signs of a decubitus.  His pulses are remarkably good dorsalis pedis is normal bilaterally. Posterior tibialis is thready.  IMPRESSION:  This is a complex situation where patient may have potential fracture without significant trauma event, given that his feet are relatively insensate.  In addition, this could be more of a radicular process versus some worsening of his diabetic neuropathy.  PLAN: 1. We will set him up for x-ray of  the foot. 2. EMG and CV depending on results may need either L5 transforaminal     or some adjustment of his medications.     In the meantime, we will start him on gabapentin 100 t.i.d.  I     explained a potential for sedation.  Discussed with patient, agrees     with plan.     [name], M.D. Electronically Signed    AEK/MedQ D:  11/27/2010 12:36:30  T:  11/28/2010 02:07:17  Job #:  [id number]  cc:   Danae Orleans. Venetia Maxon, M.D. Fax: [phone]

## 2010-11-29 NOTE — Assessment & Plan Note (Unsigned)
[facility]  NAME:  NEGAN, GRUDZIEN NO.:  [id number]  MEDICAL RECORD NO.:  [id number]      DATE OF BIRTH:  Oct 23, 1940  PHYSICIAN:  Wayland Denis, DO       VISIT DATE:  11/28/2010                                  OFFICE VISIT   Mr. Radell is a 70 year old man who has been seen in the [facility] in the past.  He presents with two areas of very hard callus on the plantar aspect of his left foot.  He has not been to a podiatrist any time recently and has not been monitoring his feet.  He noticed some hardness when he was walking and came to see [place], nothing seems to make it better or worse.  He has not really tried any treatment at this point.  He was concerned that it might be a wound and that is why he came.  MEDICAL HISTORY:  Arterial insufficiency, diabetes, and back surgeries in the past.  CURRENT MEDICATIONS:  Gabapentin and diabetic medications.  ALLERGIES:  None.  REVIEW OF SYSTEMS:  He denies any significant change in his weight, hearing, vision, difficulty breathing, chest pain or recent illness.  PHYSICAL EXAMINATION:  GENERAL:  He is alert and oriented, cooperative, in no acute distress.  He seems to be a good historian and pleasant for the exam.  He is quiet, but answers questions appropriately. HEENT:  His pupils are equal.  Extraocular muscles are intact. NECK:  No cervical lymphadenopathy. LUNGS:  Clear. HEART:  Regular. ABDOMEN:  Soft. EXTREMITIES:  Upper extremity pulses strong and regular, lower extremity present.  He has very dry feet.  Nails are in poor condition.  He has two hard areas on the left plantar aspect that are extremely calloused. The 10 blade was used to excise these, and the one closest to the heel was excised to skin level.  No ulcer was noted, but there is some blood tinged tissue underneath, but it is not soft.  No sign of infection or ulceration.  The one close to the toes does have some soft area,  but does not appear to be infected.  There may be ulcer, and I did not want it to start bleeding and had debrided quite a bit.  I recommend little triple antibiotic ointment, Dial soap soaks daily, elevation, antibiotic using cream to the rest of the skin, and follow up in couple of weeks and also Podiatry appointment for further care.     Wayland Denis, DO     CS/MEDQ  D:  11/28/2010  T:  11/29/2010  Job:  [id number]

## 2010-12-13 NOTE — Assessment & Plan Note (Signed)
[facility]  NAME:  [name], [name] NO.:  [id number]  MEDICAL RECORD NO.:  [id number]      DATE OF BIRTH:  1941/02/18  PHYSICIAN:  Wayland Denis, DO       VISIT DATE:  12/12/2010                                  OFFICE VISIT   [name] is a 70 year old black male who is here with his friends who is 66 years old.  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  He has been using triple antibiotic ointment with Dial soap soaks, elevating it, and putting Eucerin on his foot.  He has done remarkably well and the area is healing.  He still has hard calluses that are likely fairly deep and this is certainly an issue for Podiatry.  There has been no change in his social or medical history.  REVIEW OF SYSTEMS:  Negative.  PHYSICAL EXAMINATION:  GENERAL:  He is alert, oriented, cooperative, in no acute distress.  He is pleasant.  He seems pleased with the progress and understands the plan. HEENT:  His pupils are equal.  His extraocular muscles are intact.  No cervical lymphadenopathy. LUNGS:  Clear. HEART:  Regular. ABDOMEN:  Soft, nontender. EXTREMITIES:  Lower extremity wounds are as described.  There is no significant swelling.  No sign of infection.  The skin is closed and he is to continue with elevation.  He can use either Eucerin or little antibiotic ointment to keep the area moist and prevent it from cracking and we again have given him the information for Podiatry and strongly suggest that he seek their input.     Wayland Denis, DO     CS/MEDQ  D:  12/12/2010  T:  12/13/2010  Job:  [id number]

## 2010-12-26 ENCOUNTER — Encounter (HOSPITAL_BASED_OUTPATIENT_CLINIC_OR_DEPARTMENT_OTHER): Payer: Medicare Other

## 2010-12-27 ENCOUNTER — Encounter: Payer: Medicare Other | Attending: Physical Medicine & Rehabilitation

## 2010-12-27 ENCOUNTER — Ambulatory Visit: Payer: Medicare Other | Admitting: Physical Medicine & Rehabilitation

## 2011-01-12 ENCOUNTER — Emergency Department (HOSPITAL_COMMUNITY)
Admission: EM | Admit: 2011-01-12 | Discharge: 2011-01-12 | Disposition: A | Discharge status: Home / Self Care | Payer: Medicare Other | Attending: Emergency Medicine | Admitting: Emergency Medicine

## 2011-01-12 DIAGNOSIS — Z8639 Personal history of other endocrine, nutritional and metabolic disease: Secondary | ICD-10-CM | POA: Insufficient documentation

## 2011-01-12 DIAGNOSIS — M129 Arthropathy, unspecified: Secondary | ICD-10-CM | POA: Insufficient documentation

## 2011-01-12 DIAGNOSIS — R252 Cramp and spasm: Secondary | ICD-10-CM | POA: Insufficient documentation

## 2011-01-12 DIAGNOSIS — M79609 Pain in unspecified limb: Secondary | ICD-10-CM | POA: Insufficient documentation

## 2011-01-12 DIAGNOSIS — G589 Mononeuropathy, unspecified: Secondary | ICD-10-CM | POA: Insufficient documentation

## 2011-01-12 DIAGNOSIS — Z862 Personal history of diseases of the blood and blood-forming organs and certain disorders involving the immune mechanism: Secondary | ICD-10-CM | POA: Insufficient documentation

## 2011-01-12 DIAGNOSIS — R52 Pain, unspecified: Secondary | ICD-10-CM | POA: Insufficient documentation

## 2011-01-12 DIAGNOSIS — G8929 Other chronic pain: Secondary | ICD-10-CM | POA: Insufficient documentation

## 2011-01-12 DIAGNOSIS — E119 Type 2 diabetes mellitus without complications: Secondary | ICD-10-CM | POA: Insufficient documentation

## 2011-03-06 LAB — URINALYSIS, ROUTINE W REFLEX MICROSCOPIC
Bilirubin Urine: NEGATIVE
Glucose, UA: >1000 — AB
Hgb urine dipstick: NEGATIVE
Ketones, ur: NEGATIVE
Leukocytes, UA: NEGATIVE
Nitrite: NEGATIVE
Protein, ur: NEGATIVE
Specific Gravity, Urine: 1.04 — ABNORMAL HIGH
Urobilinogen, UA: 0.2
pH: 5.5

## 2011-03-06 LAB — CBC
HCT: 43.5
Hemoglobin: 14.6
MCHC: 33.5
MCV: 84.9
Platelets: 298
RBC: 5.12
RDW: 14.6
WBC: 7.1

## 2011-03-06 LAB — DIFFERENTIAL
Basophils Absolute: 0
Basophils Relative: 0
Eosinophils Absolute: 0.9 — ABNORMAL HIGH
Eosinophils Relative: 13 — ABNORMAL HIGH
Lymphocytes Relative: 24
Lymphs Abs: 1.7
Monocytes Absolute: 0.4
Monocytes Relative: 5
Neutro Abs: 4.1
Neutrophils Relative %: 58

## 2011-03-06 LAB — URINE CULTURE
Colony Count: NO GROWTH
Culture: NO GROWTH

## 2011-03-06 LAB — BASIC METABOLIC PANEL
BUN: 10
CO2: 30
Calcium: 9.7
Chloride: 101
Creatinine, Ser: 0.91
GFR calc Af Amer: >60
GFR calc non Af Amer: >60
Glucose, Bld: 354 — ABNORMAL HIGH
Potassium: 4.5
Sodium: 138

## 2011-03-06 LAB — URINE MICROSCOPIC-ADD ON

## 2011-03-08 ENCOUNTER — Emergency Department (HOSPITAL_COMMUNITY): Payer: Medicare Other

## 2011-03-08 ENCOUNTER — Emergency Department (HOSPITAL_COMMUNITY)
Admission: EM | Admit: 2011-03-08 | Discharge: 2011-03-08 | Disposition: A | Discharge status: Home / Self Care | Payer: Medicare Other | Attending: Emergency Medicine | Admitting: Emergency Medicine

## 2011-03-08 DIAGNOSIS — E119 Type 2 diabetes mellitus without complications: Secondary | ICD-10-CM | POA: Insufficient documentation

## 2011-03-08 DIAGNOSIS — M21619 Bunion of unspecified foot: Secondary | ICD-10-CM | POA: Insufficient documentation

## 2011-03-08 DIAGNOSIS — M79609 Pain in unspecified limb: Secondary | ICD-10-CM | POA: Insufficient documentation

## 2011-03-08 LAB — DIFFERENTIAL
Basophils Absolute: 0
Basophils Relative: 0
Eosinophils Absolute: 1.4 — ABNORMAL HIGH
Eosinophils Relative: 19 — ABNORMAL HIGH
Lymphocytes Relative: 23
Lymphs Abs: 1.6
Monocytes Absolute: 0.4
Monocytes Relative: 6
Neutro Abs: 3.7
Neutrophils Relative %: 52

## 2011-03-08 LAB — BASIC METABOLIC PANEL
BUN: 8
CO2: 25
Calcium: 9.1
Chloride: 99
Creatinine, Ser: 0.8
GFR calc Af Amer: >60
GFR calc non Af Amer: >60
Glucose, Bld: 307 — ABNORMAL HIGH
Potassium: 4
Sodium: 133 — ABNORMAL LOW

## 2011-03-08 LAB — SEDIMENTATION RATE: Sed Rate: 18 — ABNORMAL HIGH

## 2011-03-08 LAB — CBC
HCT: 42.9
Hemoglobin: 14.4
MCHC: 33.5
MCV: 84.9
Platelets: 277
RBC: 5.06
RDW: 14.9
WBC: 7.1

## 2011-03-11 ENCOUNTER — Encounter (HOSPITAL_BASED_OUTPATIENT_CLINIC_OR_DEPARTMENT_OTHER): Payer: Medicare Other | Attending: Internal Medicine

## 2011-03-11 ENCOUNTER — Other Ambulatory Visit (HOSPITAL_BASED_OUTPATIENT_CLINIC_OR_DEPARTMENT_OTHER): Payer: Self-pay | Admitting: General Surgery

## 2011-03-11 DIAGNOSIS — E11621 Type 2 diabetes mellitus with foot ulcer: Secondary | ICD-10-CM

## 2011-03-11 DIAGNOSIS — Z79899 Other long term (current) drug therapy: Secondary | ICD-10-CM | POA: Insufficient documentation

## 2011-03-11 DIAGNOSIS — E119 Type 2 diabetes mellitus without complications: Secondary | ICD-10-CM | POA: Insufficient documentation

## 2011-03-11 DIAGNOSIS — L97509 Non-pressure chronic ulcer of other part of unspecified foot with unspecified severity: Secondary | ICD-10-CM

## 2011-03-11 DIAGNOSIS — E1169 Type 2 diabetes mellitus with other specified complication: Secondary | ICD-10-CM | POA: Insufficient documentation

## 2011-03-11 LAB — DIFFERENTIAL
Basophils Absolute: 0
Basophils Relative: 0
Eosinophils Absolute: 1.8 — ABNORMAL HIGH
Eosinophils Relative: 21 — ABNORMAL HIGH
Lymphocytes Relative: 19
Lymphs Abs: 1.6
Monocytes Absolute: 0.5
Monocytes Relative: 6
Neutro Abs: 4.6
Neutrophils Relative %: 54

## 2011-03-11 LAB — POCT CARDIAC MARKERS
CKMB, poc: <1 — ABNORMAL LOW
Myoglobin, poc: 33
Operator id: 5451
Troponin i, poc: <0.05

## 2011-03-11 LAB — URINE MICROSCOPIC-ADD ON

## 2011-03-11 LAB — CBC
HCT: 40.3
Hemoglobin: 13.6
MCHC: 33.6
MCV: 84.2
Platelets: 258
RBC: 4.78
RDW: 14.8
WBC: 8.6

## 2011-03-11 LAB — COMPREHENSIVE METABOLIC PANEL
ALT: 17
AST: 15
Albumin: 3.2 — ABNORMAL LOW
Alkaline Phosphatase: 65
BUN: 6
CO2: 28
Calcium: 8.7
Chloride: 102
Creatinine, Ser: 0.81
GFR calc Af Amer: >60
GFR calc non Af Amer: >60
Glucose, Bld: 312 — ABNORMAL HIGH
Potassium: 4.1
Sodium: 136
Total Bilirubin: 0.7
Total Protein: 6.7

## 2011-03-11 LAB — URINALYSIS, ROUTINE W REFLEX MICROSCOPIC
Bilirubin Urine: NEGATIVE
Glucose, UA: >1000 — AB
Hgb urine dipstick: NEGATIVE
Ketones, ur: NEGATIVE
Leukocytes, UA: NEGATIVE
Nitrite: NEGATIVE
Protein, ur: NEGATIVE
Specific Gravity, Urine: 1.044 — ABNORMAL HIGH
Urobilinogen, UA: 0.2
pH: 5.5

## 2011-03-11 LAB — LIPASE, BLOOD: Lipase: 20

## 2011-03-12 LAB — URINALYSIS, ROUTINE W REFLEX MICROSCOPIC
Bilirubin Urine: NEGATIVE
Glucose, UA: >1000 — AB
Hgb urine dipstick: NEGATIVE
Ketones, ur: NEGATIVE
Leukocytes, UA: NEGATIVE
Nitrite: NEGATIVE
Protein, ur: NEGATIVE
Specific Gravity, Urine: 1.041 — ABNORMAL HIGH
Urobilinogen, UA: 0.2
pH: 6

## 2011-03-12 LAB — BASIC METABOLIC PANEL
BUN: 6
CO2: 24
Calcium: 9
Chloride: 101
Creatinine, Ser: 0.7
GFR calc Af Amer: >60
GFR calc non Af Amer: >60
Glucose, Bld: 379 — ABNORMAL HIGH
Potassium: 3.7
Sodium: 135

## 2011-03-12 LAB — URINE MICROSCOPIC-ADD ON

## 2011-03-14 LAB — LIPASE, BLOOD: Lipase: 15

## 2011-03-14 LAB — HEPATIC FUNCTION PANEL
ALT: 15
AST: 18
Albumin: 3.7
Alkaline Phosphatase: 76
Bilirubin, Direct: 0.2
Indirect Bilirubin: 0.9
Total Bilirubin: 1.1
Total Protein: 7.6

## 2011-03-14 LAB — URINALYSIS, ROUTINE W REFLEX MICROSCOPIC
Bilirubin Urine: NEGATIVE
Glucose, UA: >1000 — AB
Hgb urine dipstick: NEGATIVE
Leukocytes, UA: NEGATIVE
Nitrite: NEGATIVE
Protein, ur: NEGATIVE
Specific Gravity, Urine: 1.04 — ABNORMAL HIGH
Urobilinogen, UA: 0.2
pH: 5.5

## 2011-03-14 LAB — DIFFERENTIAL
Basophils Absolute: 0
Basophils Relative: 1
Eosinophils Absolute: 0.2
Eosinophils Relative: 3
Lymphocytes Relative: 22
Lymphs Abs: 1.2
Monocytes Absolute: 0.4
Monocytes Relative: 7
Neutro Abs: 3.8
Neutrophils Relative %: 68

## 2011-03-14 LAB — URINE MICROSCOPIC-ADD ON

## 2011-03-14 LAB — CBC
HCT: 42.5
Hemoglobin: 14.2
MCHC: 33.4
MCV: 85.5
Platelets: 274
RBC: 4.97
RDW: 14.4
WBC: 5.7

## 2011-03-14 LAB — POCT I-STAT, CHEM 8
BUN: 10
Calcium, Ion: 1.11 — ABNORMAL LOW
Chloride: 99
Creatinine, Ser: 1.1
Glucose, Bld: >700
HCT: 48
Hemoglobin: 16.3
Potassium: 4.5
Sodium: 130 — ABNORMAL LOW
TCO2: 25

## 2011-03-16 ENCOUNTER — Ambulatory Visit (HOSPITAL_COMMUNITY)
Admission: RE | Admit: 2011-03-16 | Discharge: 2011-03-16 | Disposition: A | Discharge status: Home / Self Care | Payer: Medicare Other | Source: Ambulatory Visit | Attending: General Surgery | Admitting: General Surgery

## 2011-03-16 DIAGNOSIS — E11621 Type 2 diabetes mellitus with foot ulcer: Secondary | ICD-10-CM

## 2011-03-16 DIAGNOSIS — M79609 Pain in unspecified limb: Secondary | ICD-10-CM | POA: Insufficient documentation

## 2011-03-16 DIAGNOSIS — S91309A Unspecified open wound, unspecified foot, initial encounter: Secondary | ICD-10-CM | POA: Insufficient documentation

## 2011-03-16 DIAGNOSIS — X58XXXA Exposure to other specified factors, initial encounter: Secondary | ICD-10-CM | POA: Insufficient documentation

## 2011-03-16 MED ORDER — GADOBENATE DIMEGLUMINE 529 MG/ML IV SOLN
19.0000 mL | Freq: Once | INTRAVENOUS | Status: AC | PRN
  Administered 2011-03-16: 19 mL via INTRAVENOUS

## 2011-03-19 ENCOUNTER — Encounter (HOSPITAL_BASED_OUTPATIENT_CLINIC_OR_DEPARTMENT_OTHER): Payer: Medicare Other | Attending: General Surgery

## 2011-03-19 ENCOUNTER — Other Ambulatory Visit (HOSPITAL_BASED_OUTPATIENT_CLINIC_OR_DEPARTMENT_OTHER): Payer: Self-pay | Admitting: General Surgery

## 2011-03-19 DIAGNOSIS — E119 Type 2 diabetes mellitus without complications: Secondary | ICD-10-CM | POA: Insufficient documentation

## 2011-03-19 DIAGNOSIS — E1169 Type 2 diabetes mellitus with other specified complication: Secondary | ICD-10-CM | POA: Insufficient documentation

## 2011-03-19 DIAGNOSIS — L97509 Non-pressure chronic ulcer of other part of unspecified foot with unspecified severity: Secondary | ICD-10-CM | POA: Insufficient documentation

## 2011-03-19 DIAGNOSIS — Z79899 Other long term (current) drug therapy: Secondary | ICD-10-CM | POA: Insufficient documentation

## 2011-03-19 LAB — GLUCOSE, CAPILLARY: Glucose-Capillary: 383 mg/dL — ABNORMAL HIGH (ref 70–99)

## 2011-03-20 LAB — GLUCOSE, CAPILLARY
Glucose-Capillary: 271 — ABNORMAL HIGH
Glucose-Capillary: 282 — ABNORMAL HIGH
Glucose-Capillary: 335 — ABNORMAL HIGH
Glucose-Capillary: 342 — ABNORMAL HIGH
Glucose-Capillary: 461 — ABNORMAL HIGH

## 2011-03-20 LAB — POCT I-STAT, CHEM 8
BUN: 11
Calcium, Ion: 1.23
Chloride: 101
Creatinine, Ser: 0.9
Glucose, Bld: 364 — ABNORMAL HIGH
HCT: 45
Hemoglobin: 15.3
Potassium: 4.4
Sodium: 137
TCO2: 25

## 2011-03-26 ENCOUNTER — Other Ambulatory Visit (HOSPITAL_BASED_OUTPATIENT_CLINIC_OR_DEPARTMENT_OTHER): Payer: Self-pay | Admitting: General Surgery

## 2011-03-26 LAB — GLUCOSE, CAPILLARY: Glucose-Capillary: 369 mg/dL — ABNORMAL HIGH (ref 70–99)

## 2011-03-27 LAB — BASIC METABOLIC PANEL
BUN: 9
CO2: 26
Calcium: 8.9
Chloride: 100
Creatinine, Ser: 0.87
GFR calc Af Amer: >60
GFR calc non Af Amer: >60
Glucose, Bld: 380 — ABNORMAL HIGH
Potassium: 4.1
Sodium: 135

## 2011-03-29 ENCOUNTER — Other Ambulatory Visit: Payer: Medicare Other

## 2011-04-02 ENCOUNTER — Ambulatory Visit: Payer: Medicare Other | Admitting: Infectious Disease

## 2011-04-03 ENCOUNTER — Ambulatory Visit: Payer: Medicare Other | Admitting: Infectious Disease

## 2011-04-09 NOTE — H&P (Signed)
  NAMEGERAMY, [name]                ACCOUNT NO.:  [id number]  MEDICAL RECORD NO.:  [id number]  LOCATION:  FOOT                         FACILITY:  [facility]  PHYSICIAN:  Joanne Gavel, M.D.        DATE OF BIRTH:  Apr 04, 1941  DATE OF ADMISSION:  03/11/2011 DATE OF DISCHARGE:                             HISTORY & PHYSICAL   CHIEF COMPLAINT:  Wound on right foot.  HISTORY OF PRESENT ILLNESS:  This is a diabetic for approximately 3 years length.  He has previously been seen here for a wound in the same location.  He has had surgery on that foot and now has a sizable ulcer on the plantar surface at the head of the first metatarsal.  This is quite painful and does have some drainage.  There has been no fever, chills, or sweating spells.  PAST MEDICAL HISTORY:  Significant for diabetes and possible peripheral vascular disease.  CIGARETTES:  He is a current smoker.  ALCOHOL:  None.  MEDICATIONS:  Gabapentin, tramadol, metformin, and glipizide.  ALLERGIES:  None.  REVIEW OF SYSTEMS:  Essentially negative.  PAST SURGICAL HISTORY:  He has had back operations x2.  PHYSICAL EXAMINATION:  GENERAL:  Awake, alert.  No distress. HEENT:  Head normocephalic.  Eyes, ears, nose, and throat normal. VITAL SIGNS:  Temperature is 98.2, pulse 70, respirations 16, blood pressure 143/88. NECK:  Supple.  Normal symmetrical strength. CHEST:  Clear. HEART:  Regular rhythm. EXTREMITIES:  Examination of the right foot reveals good dorsalis pedis pulse.  There is a sizable ulcer surrounded by a great deal of callus on the plantar surface of the first metatarsal head.  After debridement of this area, there is a much deeper wound.  The bone can be palpated, but there is no defect in the covering tendons.  Glucose is 103.  IMPRESSION:  Diabetic ulcer basically a problem of offloading.  TREATMENT PLAN:  Now silver collagen and wrap.  We will obtain peripheral vascular studies and MRI to rule out osteomyelitis  and we will see the patient in 7 days.     Joanne Gavel, M.D.     RA/MEDQ  D:  03/11/2011  T:  03/11/2011  Job:  [id number]  cc:   Dayton Bailiff, MD  Electronically Signed by Joanne Gavel M.D. on 04/09/2011 09:17:33 AM

## 2011-04-19 ENCOUNTER — Encounter (HOSPITAL_BASED_OUTPATIENT_CLINIC_OR_DEPARTMENT_OTHER): Payer: Medicare Other | Attending: General Surgery

## 2011-04-19 DIAGNOSIS — Z79899 Other long term (current) drug therapy: Secondary | ICD-10-CM | POA: Insufficient documentation

## 2011-04-19 DIAGNOSIS — L97509 Non-pressure chronic ulcer of other part of unspecified foot with unspecified severity: Secondary | ICD-10-CM | POA: Insufficient documentation

## 2011-04-19 DIAGNOSIS — E1169 Type 2 diabetes mellitus with other specified complication: Secondary | ICD-10-CM | POA: Insufficient documentation

## 2011-04-19 DIAGNOSIS — I739 Peripheral vascular disease, unspecified: Secondary | ICD-10-CM | POA: Insufficient documentation

## 2011-05-06 NOTE — H&P (Signed)
  NAMELEGACY, LACIVITA NO.:  [id number]  MEDICAL RECORD NO.:  [id number]           PATIENT TYPE:  LOCATION:                                 FACILITY:  PHYSICIAN:  Ardath Sax, M.D.     DATE OF BIRTH:  07-15-1940  DATE OF ADMISSION: DATE OF DISCHARGE:                             HISTORY & PHYSICAL   [name] returns after a long absence from the wound clinic.  This is a diabetic who has been treated for diabetic foot ulcers for a long time here.  He has had many attempts at different things including total contact cast.  We have also tried to get him in the HBO but he tried this and said he just could not do it.  He is 70 years of age.  He has been a diabetic for many years.  His treatment in the past has been debridement of these ulcers and these cultured out many different types of bacteria, but he responded to doxycycline and the last time he was here all the ulcers on both of his feet were healed.  Now, he has a recurrent diabetic foot ulcer on the plantar aspect of his right foot at the area of the first MP joint.  He had all this debrided.  There was a lot of callus and we finally got down to about 2 or 3-mm ulcer which had a depth of about 0.2 or 0.3.  I put him on doxycycline and we put Puracol dressings on it and we cultured the area.  I ordered an x-ray to see if there is any osteo.  He is placed on doxycycline and we will wait and see what the culture of the ulcer shows and we also put him in offloading sandals and he will come back next week.  He is going to change the Puracol dressing every other day.     Ardath Sax, M.D.     PP/MEDQ  D:  08/29/2010  T:  08/30/2010  Job:  [id number]  Electronically Signed by Ardath Sax  on 05/06/2011 01:34:28 PM

## 2011-05-06 NOTE — H&P (Signed)
  NAMEKALE, Casey NO.:  [id number]  MEDICAL RECORD NO.:  [id number]           PATIENT TYPE:  LOCATION:                                 FACILITY:  PHYSICIAN:  Ardath Sax, M.D.     DATE OF BIRTH:  12/05/1940  DATE OF ADMISSION: DATE OF DISCHARGE:                             HISTORY & PHYSICAL  He is a 70 year old type 2 diabetic.  He has never had any ulcers before.  He has a problem with two areas on the plantar aspect of his left foot, one up by his first MP joint and the other back towards his heel.  The one on his MP joint has a small ulceration and a great deal of callus.  The one towards his heel is mostly callus.  He has been a diabetic for 5 years, and he handles this very well with diabetic diet and oral agents.  He is, otherwise, healthy.  He had a fracture of his left foot when he was playing high school football and they had to put a plate in his foot and he states that is why his left foot is so much flatter than the right foot and it does appear to have somewhat of a deformity probably due to the trauma, and he has no arch in the foot at all and it is probably one reason why he has these callus formations. Other than that, he is a very healthy gentleman.  His only medicines are metformin and glipizide.  He has a normal blood pressure.  No previous surgery and no history of any heart attacks or any problems from his diabetes other than the callus formation on his left foot.  Today, we debrided the callus and got all of the firm tissue off and then we got him in a boot, and we put donuts around these callus formation, also was fortified with the donuts so that there is no pressure at all on the areas of these calluses.  We will continue to see him and he will come back here in a week, and we will see how he does with the offloading.     Ardath Sax, M.D.    PP/MEDQ  D:  02/14/2010  T:  02/15/2010  Job:  [id number]  Electronically Signed by  Ardath Sax  on 05/06/2011 01:22:53 PM

## 2011-05-24 ENCOUNTER — Encounter (HOSPITAL_BASED_OUTPATIENT_CLINIC_OR_DEPARTMENT_OTHER): Payer: Medicare Other

## 2011-05-31 ENCOUNTER — Encounter (HOSPITAL_BASED_OUTPATIENT_CLINIC_OR_DEPARTMENT_OTHER): Payer: Medicare Other | Attending: General Surgery

## 2011-05-31 DIAGNOSIS — E1169 Type 2 diabetes mellitus with other specified complication: Secondary | ICD-10-CM | POA: Insufficient documentation

## 2011-05-31 DIAGNOSIS — Z79899 Other long term (current) drug therapy: Secondary | ICD-10-CM | POA: Insufficient documentation

## 2011-05-31 DIAGNOSIS — I739 Peripheral vascular disease, unspecified: Secondary | ICD-10-CM | POA: Insufficient documentation

## 2011-05-31 DIAGNOSIS — L97509 Non-pressure chronic ulcer of other part of unspecified foot with unspecified severity: Secondary | ICD-10-CM | POA: Insufficient documentation

## 2011-06-21 ENCOUNTER — Encounter (HOSPITAL_BASED_OUTPATIENT_CLINIC_OR_DEPARTMENT_OTHER): Payer: Medicare Other | Attending: General Surgery

## 2011-06-21 DIAGNOSIS — L97509 Non-pressure chronic ulcer of other part of unspecified foot with unspecified severity: Secondary | ICD-10-CM | POA: Insufficient documentation

## 2011-06-21 DIAGNOSIS — I739 Peripheral vascular disease, unspecified: Secondary | ICD-10-CM | POA: Insufficient documentation

## 2011-06-21 DIAGNOSIS — E1169 Type 2 diabetes mellitus with other specified complication: Secondary | ICD-10-CM | POA: Insufficient documentation

## 2011-06-21 DIAGNOSIS — Z79899 Other long term (current) drug therapy: Secondary | ICD-10-CM | POA: Insufficient documentation

## 2011-07-01 ENCOUNTER — Encounter (HOSPITAL_BASED_OUTPATIENT_CLINIC_OR_DEPARTMENT_OTHER): Payer: Medicare Other

## 2011-07-19 ENCOUNTER — Encounter (HOSPITAL_BASED_OUTPATIENT_CLINIC_OR_DEPARTMENT_OTHER): Payer: Medicare Other

## 2011-07-19 ENCOUNTER — Other Ambulatory Visit (HOSPITAL_COMMUNITY): Payer: Self-pay | Admitting: Orthopedic Surgery

## 2011-07-19 ENCOUNTER — Encounter (HOSPITAL_COMMUNITY): Payer: Self-pay | Admitting: Pharmacy Technician

## 2011-07-22 ENCOUNTER — Encounter (HOSPITAL_COMMUNITY): Payer: Self-pay | Admitting: *Deleted

## 2011-07-22 ENCOUNTER — Other Ambulatory Visit (HOSPITAL_COMMUNITY): Payer: Self-pay | Admitting: *Deleted

## 2011-07-22 MED ORDER — CEFAZOLIN SODIUM 1-5 GM-% IV SOLN: 1.0000 g | INTRAVENOUS | Status: DC

## 2011-07-23 ENCOUNTER — Ambulatory Visit (HOSPITAL_COMMUNITY)
Admission: RE | Admit: 2011-07-23 | Discharge: 2011-07-24 | Disposition: A | Discharge status: Home / Self Care | Payer: Medicare Other | Source: Ambulatory Visit | Attending: Orthopedic Surgery | Admitting: Orthopedic Surgery

## 2011-07-23 ENCOUNTER — Other Ambulatory Visit: Payer: Self-pay

## 2011-07-23 ENCOUNTER — Other Ambulatory Visit: Payer: Self-pay | Admitting: Orthopedic Surgery

## 2011-07-23 ENCOUNTER — Encounter (HOSPITAL_COMMUNITY): Payer: Self-pay | Admitting: Anesthesiology

## 2011-07-23 ENCOUNTER — Encounter (HOSPITAL_COMMUNITY)
Admission: RE | Disposition: A | Discharge status: Home / Self Care | Payer: Self-pay | Source: Ambulatory Visit | Attending: Orthopedic Surgery

## 2011-07-23 ENCOUNTER — Ambulatory Visit (HOSPITAL_COMMUNITY): Payer: Medicare Other

## 2011-07-23 ENCOUNTER — Ambulatory Visit (HOSPITAL_COMMUNITY): Payer: Medicare Other | Admitting: Anesthesiology

## 2011-07-23 DIAGNOSIS — E1159 Type 2 diabetes mellitus with other circulatory complications: Secondary | ICD-10-CM | POA: Insufficient documentation

## 2011-07-23 DIAGNOSIS — I10 Essential (primary) hypertension: Secondary | ICD-10-CM | POA: Insufficient documentation

## 2011-07-23 DIAGNOSIS — F17200 Nicotine dependence, unspecified, uncomplicated: Secondary | ICD-10-CM | POA: Insufficient documentation

## 2011-07-23 DIAGNOSIS — M9080 Osteopathy in diseases classified elsewhere, unspecified site: Secondary | ICD-10-CM | POA: Insufficient documentation

## 2011-07-23 DIAGNOSIS — M869 Osteomyelitis, unspecified: Secondary | ICD-10-CM | POA: Insufficient documentation

## 2011-07-23 DIAGNOSIS — E1169 Type 2 diabetes mellitus with other specified complication: Secondary | ICD-10-CM | POA: Insufficient documentation

## 2011-07-23 DIAGNOSIS — I798 Other disorders of arteries, arterioles and capillaries in diseases classified elsewhere: Secondary | ICD-10-CM | POA: Insufficient documentation

## 2011-07-23 DIAGNOSIS — M129 Arthropathy, unspecified: Secondary | ICD-10-CM | POA: Insufficient documentation

## 2011-07-23 HISTORY — DX: Unspecified osteoarthritis, unspecified site: M19.90

## 2011-07-23 HISTORY — DX: Essential (primary) hypertension: I10

## 2011-07-23 HISTORY — DX: Peripheral vascular disease, unspecified: I73.9

## 2011-07-23 HISTORY — DX: Osteomyelitis, unspecified: M86.9

## 2011-07-23 HISTORY — PX: AMPUTATION: SHX166

## 2011-07-23 LAB — APTT: aPTT: 29 seconds (ref 24–37)

## 2011-07-23 LAB — CBC
HCT: 37.5 % — ABNORMAL LOW (ref 39.0–52.0)
Hemoglobin: 12.5 g/dL — ABNORMAL LOW (ref 13.0–17.0)
MCH: 27.9 pg (ref 26.0–34.0)
MCHC: 33.3 g/dL (ref 30.0–36.0)
MCV: 83.7 fL (ref 78.0–100.0)
Platelets: 334 10*3/uL (ref 150–400)
RBC: 4.48 MIL/uL (ref 4.22–5.81)
RDW: 14.5 % (ref 11.5–15.5)
WBC: 7 10*3/uL (ref 4.0–10.5)

## 2011-07-23 LAB — GLUCOSE, CAPILLARY
Glucose-Capillary: 204 mg/dL — ABNORMAL HIGH (ref 70–99)
Glucose-Capillary: 176 mg/dL — ABNORMAL HIGH (ref 70–99)
Glucose-Capillary: 173 mg/dL — ABNORMAL HIGH (ref 70–99)

## 2011-07-23 LAB — COMPREHENSIVE METABOLIC PANEL
ALT: 7 U/L (ref 0–53)
AST: 8 U/L (ref 0–37)
Albumin: 3.4 g/dL — ABNORMAL LOW (ref 3.5–5.2)
Alkaline Phosphatase: 60 U/L (ref 39–117)
BUN: 10 mg/dL (ref 6–23)
CO2: 25 mEq/L (ref 19–32)
Calcium: 9.6 mg/dL (ref 8.4–10.5)
Chloride: 100 mEq/L (ref 96–112)
Creatinine, Ser: 0.71 mg/dL (ref 0.50–1.35)
GFR calc Af Amer: >90 mL/min (ref >90)
GFR calc non Af Amer: >90 mL/min (ref >90)
Glucose, Bld: 238 mg/dL — ABNORMAL HIGH (ref 70–99)
Potassium: 4 mEq/L (ref 3.5–5.1)
Sodium: 135 mEq/L (ref 135–145)
Total Bilirubin: 0.3 mg/dL (ref 0.3–1.2)
Total Protein: 8 g/dL (ref 6.0–8.3)

## 2011-07-23 LAB — SURGICAL PCR SCREEN
MRSA, PCR: NEGATIVE
Staphylococcus aureus: NEGATIVE

## 2011-07-23 LAB — PROTIME-INR
INR: 1.11 (ref 0.00–1.49)
Prothrombin Time: 14.5 seconds (ref 11.6–15.2)

## 2011-07-23 SURGERY — AMPUTATION DIGIT
Anesthesia: Regional | Site: Toe | Laterality: Right | Wound class: Contaminated

## 2011-07-23 MED ORDER — WARFARIN SODIUM 7.5 MG PO TABS
7.5000 mg | ORAL_TABLET | Freq: Once | ORAL | Status: AC
  Administered 2011-07-23: 7.5 mg via ORAL
  Filled 2011-07-23: qty 1

## 2011-07-23 MED ORDER — PROPOFOL 10 MG/ML IV BOLUS
INTRAVENOUS | Status: DC | PRN
  Administered 2011-07-23 (×3): 20 mg via INTRAVENOUS

## 2011-07-23 MED ORDER — BUPIVACAINE-EPINEPHRINE PF 0.5-1:200000 % IJ SOLN
INTRAMUSCULAR | Status: DC | PRN
  Administered 2011-07-23: 150 mg

## 2011-07-23 MED ORDER — ONDANSETRON HCL 4 MG/2ML IJ SOLN: 4.0000 mg | Freq: Four times a day (QID) | INTRAMUSCULAR | Status: DC | PRN

## 2011-07-23 MED ORDER — CLINDAMYCIN PHOSPHATE 600 MG/50ML IV SOLN
600.0000 mg | Freq: Four times a day (QID) | INTRAVENOUS | Status: AC
  Administered 2011-07-23 – 2011-07-24 (×3): 600 mg via INTRAVENOUS
  Filled 2011-07-23 (×3): qty 50

## 2011-07-23 MED ORDER — FENTANYL CITRATE 0.05 MG/ML IJ SOLN
INTRAMUSCULAR | Status: DC | PRN
  Administered 2011-07-23: 100 ug via INTRAVENOUS
  Administered 2011-07-23: 50 ug via INTRAVENOUS
  Administered 2011-07-23: 100 ug via INTRAVENOUS

## 2011-07-23 MED ORDER — WARFARIN VIDEO: Freq: Once | Status: DC

## 2011-07-23 MED ORDER — LISINOPRIL 20 MG PO TABS
20.0000 mg | ORAL_TABLET | Freq: Every day | ORAL | Status: DC
  Administered 2011-07-23 – 2011-07-24 (×2): 20 mg via ORAL
  Filled 2011-07-23 (×2): qty 1

## 2011-07-23 MED ORDER — CEFAZOLIN SODIUM 1-5 GM-% IV SOLN
INTRAVENOUS | Status: AC
  Filled 2011-07-23: qty 50

## 2011-07-23 MED ORDER — MUPIROCIN 2 % EX OINT
TOPICAL_OINTMENT | CUTANEOUS | Status: AC
  Filled 2011-07-23: qty 22

## 2011-07-23 MED ORDER — GLIPIZIDE 5 MG PO TABS
5.0000 mg | ORAL_TABLET | Freq: Two times a day (BID) | ORAL | Status: DC
  Administered 2011-07-24: 5 mg via ORAL
  Filled 2011-07-23 (×4): qty 1

## 2011-07-23 MED ORDER — METOCLOPRAMIDE HCL 10 MG PO TABS: 5.0000 mg | ORAL_TABLET | Freq: Three times a day (TID) | ORAL | Status: DC | PRN

## 2011-07-23 MED ORDER — GABAPENTIN 300 MG PO CAPS
300.0000 mg | ORAL_CAPSULE | Freq: Three times a day (TID) | ORAL | Status: DC
  Administered 2011-07-23 – 2011-07-24 (×2): 300 mg via ORAL
  Filled 2011-07-23 (×4): qty 1

## 2011-07-23 MED ORDER — ONDANSETRON HCL 4 MG PO TABS: 4.0000 mg | ORAL_TABLET | Freq: Four times a day (QID) | ORAL | Status: DC | PRN

## 2011-07-23 MED ORDER — CEFAZOLIN SODIUM-DEXTROSE 2-3 GM-% IV SOLR
INTRAVENOUS | Status: AC
  Filled 2011-07-23: qty 50

## 2011-07-23 MED ORDER — METOCLOPRAMIDE HCL 5 MG/ML IJ SOLN
5.0000 mg | Freq: Three times a day (TID) | INTRAMUSCULAR | Status: DC | PRN
  Filled 2011-07-23: qty 2

## 2011-07-23 MED ORDER — PATIENT'S GUIDE TO USING COUMADIN BOOK
Freq: Once | Status: AC
  Administered 2011-07-23: 23:00:00
  Filled 2011-07-23: qty 1

## 2011-07-23 MED ORDER — OXYCODONE-ACETAMINOPHEN 5-325 MG PO TABS: 1.0000 | ORAL_TABLET | ORAL | Status: DC | PRN

## 2011-07-23 MED ORDER — MIDAZOLAM HCL 5 MG/5ML IJ SOLN
INTRAMUSCULAR | Status: DC | PRN
  Administered 2011-07-23: 2 mg via INTRAVENOUS

## 2011-07-23 MED ORDER — HYDROCODONE-ACETAMINOPHEN 5-325 MG PO TABS: 1.0000 | ORAL_TABLET | ORAL | Status: DC | PRN

## 2011-07-23 MED ORDER — LACTATED RINGERS IV SOLN
INTRAVENOUS | Status: DC | PRN
  Administered 2011-07-23: 18:00:00 via INTRAVENOUS

## 2011-07-23 MED ORDER — METFORMIN HCL 500 MG PO TABS
1000.0000 mg | ORAL_TABLET | Freq: Every day | ORAL | Status: DC
Start: 2011-07-23 — End: 2011-07-24
  Administered 2011-07-23 – 2011-07-24 (×2): 1000 mg via ORAL
  Filled 2011-07-23 (×2): qty 2

## 2011-07-23 MED ORDER — CEFAZOLIN SODIUM-DEXTROSE 2-3 GM-% IV SOLR
2.0000 g | INTRAVENOUS | Status: AC
  Administered 2011-07-23: 1 g via INTRAVENOUS
  Filled 2011-07-23: qty 50

## 2011-07-23 MED ORDER — MUPIROCIN 2 % EX OINT
TOPICAL_OINTMENT | Freq: Two times a day (BID) | CUTANEOUS | Status: DC
  Administered 2011-07-23: 15:00:00 via NASAL

## 2011-07-23 MED ORDER — SODIUM CHLORIDE 0.9 % IV SOLN
INTRAVENOUS | Status: DC
  Administered 2011-07-23: 20 mL/h via INTRAVENOUS

## 2011-07-23 MED ORDER — HYDROMORPHONE HCL PF 1 MG/ML IJ SOLN: 0.5000 mg | INTRAMUSCULAR | Status: DC | PRN

## 2011-07-23 SURGICAL SUPPLY — 54 items
BANDAGE COBAN STERILE 6 (GAUZE/BANDAGES/DRESSINGS) ×1 IMPLANT
BANDAGE GAUZE 4  KLING STR (GAUZE/BANDAGES/DRESSINGS) IMPLANT
BLADE AVERAGE 25X9 (BLADE) IMPLANT
BLADE MINI RND TIP GREEN BEAV (BLADE) IMPLANT
BNDG CMPR 9X4 STRL LF SNTH (GAUZE/BANDAGES/DRESSINGS)
BNDG COHESIVE 1X5 TAN STRL LF (GAUZE/BANDAGES/DRESSINGS) IMPLANT
BNDG COHESIVE 6X5 TAN STRL LF (GAUZE/BANDAGES/DRESSINGS) ×1 IMPLANT
BNDG ESMARK 4X9 LF (GAUZE/BANDAGES/DRESSINGS) ×1 IMPLANT
BNDG GAUZE STRTCH 6 (GAUZE/BANDAGES/DRESSINGS) IMPLANT
CLOTH BEACON ORANGE TIMEOUT ST (SAFETY) ×2 IMPLANT
CORDS BIPOLAR (ELECTRODE) ×2 IMPLANT
COVER SURGICAL LIGHT HANDLE (MISCELLANEOUS) ×2 IMPLANT
CUFF TOURNIQUET SINGLE 18IN (TOURNIQUET CUFF) IMPLANT
CUFF TOURNIQUET SINGLE 24IN (TOURNIQUET CUFF) IMPLANT
CUFF TOURNIQUET SINGLE 34IN LL (TOURNIQUET CUFF) IMPLANT
CUFF TOURNIQUET SINGLE 44IN (TOURNIQUET CUFF) IMPLANT
DRAPE U-SHAPE 47X51 STRL (DRAPES) ×2 IMPLANT
DRSG ADAPTIC 3X8 NADH LF (GAUZE/BANDAGES/DRESSINGS) ×1 IMPLANT
DRSG PAD ABDOMINAL 8X10 ST (GAUZE/BANDAGES/DRESSINGS) ×1 IMPLANT
DURAPREP 26ML APPLICATOR (WOUND CARE) ×2 IMPLANT
ELECT REM PT RETURN 9FT ADLT (ELECTROSURGICAL) ×2
ELECTRODE REM PT RTRN 9FT ADLT (ELECTROSURGICAL) ×1 IMPLANT
GAUZE KERLIX 2  STERILE LF (GAUZE/BANDAGES/DRESSINGS) ×1 IMPLANT
GAUZE SPONGE 2X2 8PLY STRL LF (GAUZE/BANDAGES/DRESSINGS) IMPLANT
GLOVE BIOGEL PI IND STRL 9 (GLOVE) ×1 IMPLANT
GLOVE BIOGEL PI INDICATOR 9 (GLOVE) ×1
GLOVE SURG ORTHO 9.0 STRL STRW (GLOVE) ×2 IMPLANT
GOWN PREVENTION PLUS XLARGE (GOWN DISPOSABLE) ×2 IMPLANT
GOWN SRG XL XLNG 56XLVL 4 (GOWN DISPOSABLE) ×1 IMPLANT
GOWN STRL NON-REIN XL XLG LVL4 (GOWN DISPOSABLE) ×2
KIT BASIN OR (CUSTOM PROCEDURE TRAY) ×2 IMPLANT
KIT ROOM TURNOVER OR (KITS) ×2 IMPLANT
MANIFOLD NEPTUNE II (INSTRUMENTS) ×2 IMPLANT
NDL HYPO 25GX1X1/2 BEV (NEEDLE) IMPLANT
NEEDLE HYPO 25GX1X1/2 BEV (NEEDLE) IMPLANT
NS IRRIG 1000ML POUR BTL (IV SOLUTION) ×2 IMPLANT
PACK ORTHO EXTREMITY (CUSTOM PROCEDURE TRAY) ×2 IMPLANT
PAD ARMBOARD 7.5X6 YLW CONV (MISCELLANEOUS) ×4 IMPLANT
PAD CAST 4YDX4 CTTN HI CHSV (CAST SUPPLIES) IMPLANT
PADDING CAST COTTON 4X4 STRL (CAST SUPPLIES)
SPECIMEN JAR SMALL (MISCELLANEOUS) ×2 IMPLANT
SPONGE GAUZE 2X2 STER 10/PKG (GAUZE/BANDAGES/DRESSINGS)
SPONGE GAUZE 4X4 12PLY (GAUZE/BANDAGES/DRESSINGS) IMPLANT
SPONGE GAUZE 4X4 STERILE 39 (GAUZE/BANDAGES/DRESSINGS) ×1 IMPLANT
SPONGE LAP 18X18 X RAY DECT (DISPOSABLE) ×4 IMPLANT
SUCTION FRAZIER TIP 10 FR DISP (SUCTIONS) ×1 IMPLANT
SUT ETHILON 2 0 PSLX (SUTURE) ×2 IMPLANT
SUT VIC AB 2-0 FS1 27 (SUTURE) IMPLANT
SYR CONTROL 10ML LL (SYRINGE) IMPLANT
TOWEL OR 17X24 6PK STRL BLUE (TOWEL DISPOSABLE) ×2 IMPLANT
TOWEL OR 17X26 10 PK STRL BLUE (TOWEL DISPOSABLE) ×2 IMPLANT
TUBE CONNECTING 12X1/4 (SUCTIONS) ×1 IMPLANT
WATER STERILE IRR 1000ML POUR (IV SOLUTION) ×1 IMPLANT
YANKAUER SUCT BULB TIP NO VENT (SUCTIONS) ×1 IMPLANT

## 2011-07-23 NOTE — H&P (Signed)
[name] is an 71 y.o. male.   Chief Complaint: ulcer right great toe HPI: chronic osteomylitis right great toe  Past Medical History  Diagnosis Date  . Diabetes mellitus   . Osteomyelitis     Right great toe  . Hypertension   . Arthritis     Past Surgical History  Procedure Date  . L foot surgery 2006  . Circumcision 2010  . Foot fracture surgery   . Left arm  plate     Family History  Problem Relation Age of Onset  . Anesthesia problems Neg Hx    Social History:  reports that he has been smoking Pipe.  He does not have any smokeless tobacco history on file. He reports that he does not drink alcohol or use illicit drugs.  Allergies: No Known Allergies  Medications Prior to Admission  Medication Dose Route Frequency Provider Last Rate Last Dose  . ceFAZolin (ANCEF) 2-3 GM-% IVPB SOLR           . ceFAZolin (ANCEF) IVPB 2 g/50 mL premix  2 g Intravenous 60 min Pre-Op Nadara Mustard, MD      . mupirocin ointment (BACTROBAN) 2 %   Nasal BID Nadara Mustard, MD      . mupirocin ointment (BACTROBAN) 2 %           . DISCONTD: ceFAZolin (ANCEF) IVPB 1 g/50 mL premix  1 g Intravenous 60 min Pre-Op Nadara Mustard, MD       Medications Prior to Admission  Medication Sig Dispense Refill  . gabapentin (NEURONTIN) 300 MG capsule Take 300 mg by mouth 3 (three) times daily.       Marland Kitchen glipiZIDE (GLUCOTROL) 5 MG tablet Take 5 mg by mouth 2 (two) times daily before a meal.         Results for orders placed during the hospital encounter of 07/23/11 (from the past 48 hour(s))  APTT     Status: Normal   Collection Time   07/23/11  2:38 PM      Component Value Range Comment   aPTT 29  24 - 37 (seconds)   CBC     Status: Abnormal   Collection Time   07/23/11  2:38 PM      Component Value Range Comment   WBC 7.0  4.0 - 10.5 (K/uL)    RBC 4.48  4.22 - 5.81 (MIL/uL)    Hemoglobin 12.5 (*) 13.0 - 17.0 (g/dL)    HCT 40.9 (*) 81.1 - 52.0 (%)    MCV 83.7  78.0 - 100.0 (fL)    MCH 27.9  26.0 -  34.0 (pg)    MCHC 33.3  30.0 - 36.0 (g/dL)    RDW 91.4  78.2 - 95.6 (%)    Platelets 334  150 - 400 (K/uL)   COMPREHENSIVE METABOLIC PANEL     Status: Abnormal   Collection Time   07/23/11  2:38 PM      Component Value Range Comment   Sodium 135  135 - 145 (mEq/L)    Potassium 4.0  3.5 - 5.1 (mEq/L)    Chloride 100  96 - 112 (mEq/L)    CO2 25  19 - 32 (mEq/L)    Glucose, Bld 238 (*) 70 - 99 (mg/dL)    BUN 10  6 - 23 (mg/dL)    Creatinine, Ser 2.13  0.50 - 1.35 (mg/dL)    Calcium 9.6  8.4 - 10.5 (mg/dL)    Total  Protein 8.0  6.0 - 8.3 (g/dL)    Albumin 3.4 (*) 3.5 - 5.2 (g/dL)    AST 8  0 - 37 (U/L)    ALT 7  0 - 53 (U/L)    Alkaline Phosphatase 60  39 - 117 (U/L)    Total Bilirubin 0.3  0.3 - 1.2 (mg/dL)    GFR calc non Af Amer >90  >90 (mL/min)    GFR calc Af Amer >90  >90 (mL/min)   PROTIME-INR     Status: Normal   Collection Time   07/23/11  2:38 PM      Component Value Range Comment   Prothrombin Time 14.5  11.6 - 15.2 (seconds)    INR 1.11  0.00 - 1.49    SURGICAL PCR SCREEN     Status: Normal   Collection Time   07/23/11  2:44 PM      Component Value Range Comment   MRSA, PCR NEGATIVE  NEGATIVE     Staphylococcus aureus NEGATIVE  NEGATIVE    GLUCOSE, CAPILLARY     Status: Abnormal   Collection Time   07/23/11  3:48 PM      Component Value Range Comment   Glucose-Capillary 204 (*) 70 - 99 (mg/dL)    Dg Chest 2 View  06/22/1094  *RADIOLOGY REPORT*  Clinical Data: Preop for orthopedic procedure  CHEST - 2 VIEW  Comparison: Chest radiograph 07/19/2010  Findings: Normal mediastinum and cardiac silhouette.  Normal pulmonary  vasculature.  No evidence of effusion, infiltrate, or pneumothorax.  No acute bony abnormality. Degenerative osteophytosis of the thoracic spine.  IMPRESSION: No acute cardiopulmonary process.  Original Report Authenticated By: Genevive Bi, M.D.    Review of Systems  All other systems reviewed and are negative.    Blood pressure 123/72, pulse 90,  temperature 98.1 F (36.7 C), temperature source Oral, resp. rate 18, height 6\' 1"  (1.854 m), weight 88.451 kg (195 lb), SpO2 99.00%. Physical Exam  Ulcer and cellulitis and osteomylitis right great toe Assessment/Plan Right great toe osteomylitis, cellulitis and wagner grade III ulcer Plan for amputation of right great toe  DUDA,MARCUS V 07/23/2011, 5:43 PM

## 2011-07-23 NOTE — Op Note (Signed)
OPERATIVE REPORT  DATE OF SURGERY: 07/23/2011  PATIENT:  [name],  71 y.o. male  PRE-OPERATIVE DIAGNOSIS:  Right Great Toe/Sesamoid Osteomyelitis   POST-OPERATIVE DIAGNOSIS:  Right Great Toe/Sesamoid Osteomyelitis   PROCEDURE:  Procedure(s): Amputation first ray right foot  SURGEON:  Surgeon(s): Nadara Mustard, MD  ANESTHESIA:   regional  EBL:  Minimal ML  SPECIMEN:  Source of Specimen:  Right first metatarsal and great toe sent to pathology  TOURNIQUET:  * No tourniquets in log *  PROCEDURE DETAILS: Patient is a 71 year old gentleman with diabetes peripheral vascular disease who has had an ulcer osteomyelitis of the right great toe first metatarsal head. Patient has failed conservative treatment he has destructive changes of the bone and presents at this time for amputation. Risks and benefits of surgery were discussed including infection neurovascular injury persistent pain nonhealing of the wound need for additional surgery or a higher level amputation patient states he understands and wishes to proceed at this time. Description of procedure patient was brought to OR room 1 and underwent a ankle block. After adequate levels of anesthesia were obtained patient's right lower extremity was prepped using DuraPrep and draped into a sterile field. A racquet incision was made to include the plantar ulcer along the medial border of the first ray. The first metatarsal great toe and sesamoid bones were removed in one block of tissue. Electrocautery was used for hemostasis. The wound was irrigated with normal saline. The wound was closed without tension the skin with 2-0 nylon. The wound was covered with Adaptic orthopedic sponges ABDs dressing Kerlix and Coban. Patient was taken to the PACU in stable condition plan for overnight observation.  PLAN OF CARE: Admit for overnight observation  PATIENT DISPOSITION:  PACU - hemodynamically stable.   Nadara Mustard, MD 07/23/2011 6:40 PM

## 2011-07-23 NOTE — Progress Notes (Signed)
Orthopedic Tech Progress Note Patient Details:  [name] 04-15-1941 [id number]  Other Ortho Devices Type of Ortho Device: Postop boot Ortho Device Location: (R) LE Ortho Device Interventions: Application   Jennye Moccasin 07/23/2011, 7:34 PM

## 2011-07-23 NOTE — Anesthesia Postprocedure Evaluation (Signed)
Anesthesia Post Note  Patient: [name]  Procedure(s) Performed:  AMPUTATION DIGIT - Right Great Toe Amputation  Anesthesia type: general  Patient location: PACU  Post pain: Pain level controlled  Post assessment: Patient's Cardiovascular Status Stable  Last Vitals:  Filed Vitals:   07/23/11 2010  BP: 104/57  Pulse: 55  Temp: 36.4 C  Resp: 14    Post vital signs: Reviewed and stable  Level of consciousness: sedated  Complications: No apparent anesthesia complications

## 2011-07-23 NOTE — Anesthesia Preprocedure Evaluation (Addendum)
Anesthesia Evaluation  Patient identified by MRN, date of birth, ID band Patient awake    Reviewed: Allergy & Precautions, H&P , NPO status , Patient's Chart, lab work & pertinent test results, reviewed documented beta blocker date and time   History of Anesthesia Complications Negative for: history of anesthetic complications  Airway Mallampati: II TM Distance: >3 FB Neck ROM: Full    Dental  (+) Edentulous Upper and Teeth Intact   Pulmonary Current Smoker,  clear to auscultation  Pulmonary exam normal       Cardiovascular hypertension, Pt. on medications Regular Normal- Systolic murmurs    Neuro/Psych Negative Neurological ROS     GI/Hepatic negative GI ROS, Neg liver ROS,   Endo/Other  Diabetes mellitus-, Well Controlled, Type 2  Renal/GU negative Renal ROS     Musculoskeletal   Abdominal   Peds  Hematology   Anesthesia Other Findings   Reproductive/Obstetrics                           Anesthesia Physical Anesthesia Plan  ASA: III  Anesthesia Plan: MAC   Post-op Pain Management: MAC Combined w/ Regional for Post-op pain   Induction:   Airway Management Planned: Simple Face Mask  Additional Equipment:   Intra-op Plan:   Post-operative Plan:   Informed Consent: I have reviewed the patients History and Physical, chart, labs and discussed the procedure including the risks, benefits and alternatives for the proposed anesthesia with the patient or authorized representative who has indicated his/her understanding and acceptance.   Dental advisory given  Plan Discussed with: CRNA and Anesthesiologist  Anesthesia Plan Comments:        Anesthesia Quick Evaluation

## 2011-07-23 NOTE — Progress Notes (Signed)
ANTICOAGULATION CONSULT NOTE - Initial Consult  Pharmacy Consult for coumadin Indication: VTE prophylaxis  No Known Allergies  Patient Measurements: Height: 6\' 1"  (185.4 cm) Weight: 195 lb (88.451 kg) IBW/kg (Calculated) : 79.9   Vital Signs: Temp: 97.6 F (36.4 C) (02/05 2010) Temp src: Oral (02/05 1438) BP: 104/57 mmHg (02/05 2010) Pulse Rate: 55  (02/05 2010)  Labs:  Basename 07/23/11 1438  HGB 12.5*  HCT 37.5*  PLT 334  APTT 29  LABPROT 14.5  INR 1.11  HEPARINUNFRC --  CREATININE 0.71  CKTOTAL --  CKMB --  TROPONINI --   Estimated Creatinine Clearance: 99.9 ml/min (by C-G formula based on Cr of 0.71).  Medical History: Past Medical History  Diagnosis Date  . Diabetes mellitus   . Osteomyelitis     Right great toe  . Hypertension   . Arthritis     Medications:  Prescriptions prior to admission  Medication Sig Dispense Refill  . doxycycline (VIBRAMYCIN) 100 MG capsule Take 100 mg by mouth 2 (two) times daily.      Marland Kitchen gabapentin (NEURONTIN) 300 MG capsule Take 300 mg by mouth 3 (three) times daily.       Marland Kitchen glipiZIDE (GLUCOTROL) 5 MG tablet Take 5 mg by mouth 2 (two) times daily before a meal.       . lisinopril (PRINIVIL,ZESTRIL) 20 MG tablet Take 20 mg by mouth daily.      . metFORMIN (GLUCOPHAGE) 1000 MG tablet Take 1,000 mg by mouth daily.        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 Goal of Therapy:  INR 2-3   Plan:  Coumadin 7.5mg  x 1 dose tonight. Daily INR. Coumadin book and video for education.  Len Childs T 07/23/2011,10:05 PM

## 2011-07-23 NOTE — Transfer of Care (Signed)
Immediate Anesthesia Transfer of Care Note  Patient: [name]  Procedure(s) Performed:  AMPUTATION DIGIT - Right Great Toe Amputation  Patient Location: PACU  Anesthesia Type: MAC and Regional  Level of Consciousness: awake, alert  and oriented  Airway & Oxygen Therapy: Patient Spontanous Breathing and Patient connected to nasal cannula oxygen  Post-op Assessment: Report given to PACU RN and Post -op Vital signs reviewed and stable  Post vital signs: stable  Complications: No apparent anesthesia complications

## 2011-07-23 NOTE — Anesthesia Procedure Notes (Signed)
Anesthesia Regional Block:  Popliteal block  Pre-Anesthetic Checklist: ,, timeout performed, Correct Patient, Correct Site, Correct Laterality, Correct Procedure,, site marked, risks and benefits discussed, Surgical consent,  Pre-op evaluation,  At surgeon's request and post-op pain management  Laterality: Right  Prep: chloraprep       Needles:  Injection technique: Single-shot  Needle Type: Echogenic Stimulator Needle          Additional Needles:  Procedures: ultrasound guided and nerve stimulator Popliteal block  Nerve Stimulator or Paresthesia:  Response: plantar flexion, 0.45 mA,   Additional Responses:   Narrative:  Start time: 07/23/2011 5:21 PM End time: 07/23/2011 5:31 PM  Performed by: Personally  Anesthesiologist: J. Adonis Huguenin, MD  Additional Notes: A functioning IV was confirmed and monitors were applied.  Sterile prep and drape, hand hygiene and sterile gloves were used.  Negative aspiration and test dose prior to incremental administration of local anesthetic. The patient tolerated the procedure well. Ultrasound guidance: relevent anatomy identified, needle position confirmed, local anesthetic spread visualized around nerve(s), vascular puncture avoided.  Image printed for medical record.   Popliteal block

## 2011-07-24 ENCOUNTER — Encounter (HOSPITAL_COMMUNITY): Payer: Self-pay | Admitting: General Practice

## 2011-07-24 LAB — BASIC METABOLIC PANEL
BUN: 9 mg/dL (ref 6–23)
CO2: 27 mEq/L (ref 19–32)
Calcium: 9.2 mg/dL (ref 8.4–10.5)
Chloride: 101 mEq/L (ref 96–112)
Creatinine, Ser: 0.68 mg/dL (ref 0.50–1.35)
GFR calc Af Amer: >90 mL/min (ref >90)
GFR calc non Af Amer: >90 mL/min (ref >90)
Glucose, Bld: 250 mg/dL — ABNORMAL HIGH (ref 70–99)
Potassium: 4.2 mEq/L (ref 3.5–5.1)
Sodium: 136 mEq/L (ref 135–145)

## 2011-07-24 LAB — GLUCOSE, CAPILLARY
Glucose-Capillary: 228 mg/dL — ABNORMAL HIGH (ref 70–99)
Glucose-Capillary: 239 mg/dL — ABNORMAL HIGH (ref 70–99)

## 2011-07-24 LAB — CBC
HCT: 35.4 % — ABNORMAL LOW (ref 39.0–52.0)
Hemoglobin: 11.3 g/dL — ABNORMAL LOW (ref 13.0–17.0)
MCH: 27.2 pg (ref 26.0–34.0)
MCHC: 31.9 g/dL (ref 30.0–36.0)
MCV: 85.3 fL (ref 78.0–100.0)
Platelets: 321 10*3/uL (ref 150–400)
RBC: 4.15 MIL/uL — ABNORMAL LOW (ref 4.22–5.81)
RDW: 14.8 % (ref 11.5–15.5)
WBC: 10.3 10*3/uL (ref 4.0–10.5)

## 2011-07-24 LAB — PROTIME-INR
INR: 1.18 (ref 0.00–1.49)
Prothrombin Time: 15.3 seconds — ABNORMAL HIGH (ref 11.6–15.2)

## 2011-07-24 MED ORDER — HYDROCODONE-ACETAMINOPHEN 5-500 MG PO TABS: 1.0000 | ORAL_TABLET | Freq: Four times a day (QID) | ORAL | Status: AC | PRN

## 2011-07-24 NOTE — Progress Notes (Signed)
Physical Therapy Evaluation Patient Details Name: [name] MRN: [id number] DOB: 02-15-1941 Today's Date: 07/24/2011  Problem List:  Patient Active Problem List  Diagnoses  . POSTHERPETIC NEURALGIA  . CANDIDIASIS, GLANS PENIS  . CHERRY ANGIOMA  . DIABETES MELLITUS, TYPE II  . Other and Unspecified Hyperlipidemia  . GOUT  . ERECTILE DYSFUNCTION  . HEMORRHOIDS, INTERNAL  . EXTERNAL HEMORRHOIDS  . VENTRAL HERNIA  . FATTY LIVER DISEASE  . ABSCESS, AXILLA, LEFT  . OSTEOARTHRITIS  . HEADACHE  . CHEST WALL PAIN, ACUTE  . COLONIC POLYPS, HX OF    Past Medical History:  Past Medical History  Diagnosis Date  . Diabetes mellitus   . Osteomyelitis     Right great toe  . Hypertension   . Arthritis    Past Surgical History:  Past Surgical History  Procedure Date  . L foot surgery 2006  . Circumcision 2010  . Foot fracture surgery   . Left arm  plate     PT Assessment/Plan/Recommendation PT Assessment Clinical Impression Statement: Patient with R 1st toe amputation requiring use of R post-op shoe for ambulation at 25% PWB through R LE. Patient lives alone however to have daughter come in/out of home daily and to perform cooking, cleaning and laundry. Patient plans to stay at home until post op appt in 2 weeks. Patient safe to ambulate with RW household distances and instructed sit when having to urinate in addition to BM to decrease fall risk.  PT Recommendation/Assessment: Patient will need skilled PT in the acute care venue PT Problem List: Decreased strength;Decreased range of motion;Decreased activity tolerance;Decreased balance Barriers to Discharge: Decreased caregiver support PT Therapy Diagnosis : Abnormality of gait;Difficulty walking PT Plan PT Frequency: Min 4X/week PT Treatment/Interventions: Gait training;DME instruction;Therapeutic activities;Therapeutic exercise;Balance training PT Recommendation Follow Up Recommendations: No PT follow up;Supervision -  Intermittent Equipment Recommended: Rolling walker with 5" wheels (RN notified) PT Goals  Acute Rehab PT Goals PT Goal Formulation: With patient Time For Goal Achievement: 7 days Pt will go Sit to Stand: Independently (and safely with RW.) PT Goal: Sit to Stand - Progress: Goal set today Pt will Stand: Independently;with no upper extremity support (for safe urination while maintaining R LE PWB of 25%.) PT Goal: Stand - Progress: Goal set today Pt will Ambulate: >150 feet;with modified independence;with rolling walker PT Goal: Ambulate - Progress: Goal set today Additional Goals Additional Goal #1: Patient to be 100% compliant with R LE PWB at 25% during transfers, ambulation, and standing. PT Goal: Additional Goal #1 - Progress: Goal set today  PT Evaluation Precautions/Restrictions  Precautions Precautions: Fall Required Braces or Orthoses: Yes Other Brace/Splint: R LE post-op shoe - spoke with Ortho tech re: finding a smaller post-op shoe for optimal support however due to increased thickness of dressing current post-op shoe might be best option. Ortho tech to re-assess patient today prior to being d/c. Restrictions Weight Bearing Restrictions: Yes RUE Weight Bearing: Partial weight bearing RUE Partial Weight Bearing Percentage or Pounds: 25% Prior Functioning  Home Living Lives With: Alone Receives Help From: Family (daughter to cook meals and do laundry) Type of Home: Apartment Home Layout: One level Home Access: Level entry Bathroom Shower/Tub: Engineer, manufacturing systems: Standard Bathroom Accessibility: Yes How Accessible: Accessible via walker Home Adaptive Equipment: None Prior Function Level of Independence: Independent with basic ADLs;Independent with gait;Independent with transfers Able to Take Stairs?: Yes Driving: Yes Vocation: Retired Financial risk analyst Arousal/Alertness: Awake/alert Overall Cognitive Status: Appears within functional limits for tasks  assessed Orientation Level: Oriented X4 Sensation/Coordination Sensation Light Touch: Impaired by gross assessment (patient with decreased sensation below bilat knees) Extremity Assessment RUE Assessment RUE Assessment: Within Functional Limits LUE Assessment LUE Assessment: Within Functional Limits RLE Assessment RLE Assessment: Within Functional Limits LLE Assessment LLE Assessment: Within Functional Limits Mobility (including Balance) Bed Mobility Bed Mobility: Yes Supine to Sit: 7: Independent Sit to Supine: 7: Independent Transfers Transfers: Yes Sit to Stand: 6: Modified independent (Device/Increase time);From bed (up to walker) Stand to Sit: 6: Modified independent (Device/Increase time);To bed Ambulation/Gait Ambulation/Gait: Yes Ambulation/Gait Assistance: 5: Supervision Ambulation/Gait Assistance Details (indicate cue type and reason): verbal cues to maintain R LE PWB of 25%, per patient report pt is placing less than 25% through R LE while in post-op shoe Ambulation Distance (Feet): 50 Feet Assistive device: Rolling walker Gait Pattern: Step-to pattern;Decreased step length - right;Decreased stance time - right Gait velocity: decreased cadence Wheelchair Mobility Wheelchair Mobility: No  Posture/Postural Control Posture/Postural Control: No significant limitations Balance Balance Assessed: Yes Static Sitting Balance Static Sitting - Balance Support: No upper extremity supported Static Sitting - Level of Assistance: 7: Independent Static Sitting - Comment/# of Minutes: instructed patient to use urinal in sitting not standing Static Standing Balance Static Standing - Balance Support: Left upper extremity supported (patient unsteady without UE support) Static Standing - Level of Assistance: 5: Stand by assistance Static Standing - Comment/# of Minutes: pt stood x 2 min to use urinal however patient with increased fall risk requiring unilateral UE support Exercise      End of Session PT - End of Session Equipment Utilized During Treatment: Gait belt (R post op shoe) Activity Tolerance: Patient tolerated treatment well Patient left: in bed;with bed alarm set Nurse Communication: Mobility status for transfers General Behavior During Session: [facility] for tasks performed (however with decreased insight to defecits) Cognition: [facility] for tasks performed  Marcene Brawn 07/24/2011, 8:56 AM  Lewis Shock, PT, DPT Pager #: [phone] Office #: [phone]

## 2011-07-24 NOTE — Discharge Summary (Signed)
Physician Discharge Summary  Patient ID: [name] MRN: [id number] DOB/AGE: 69-Jun-1942 69 y.o.  Admit date: 07/23/2011 Discharge date: 07/24/2011  Admission Diagnoses: Diabetic insensate neuropathy with [name] grade 3 ulcer right great toe with osteomyelitis of the right great toe  Discharge Diagnoses: Same Active Problems:  * No active hospital problems. *    Discharged Condition: stable  Hospital Course: Patient's hospital course was essentially unremarkable. He underwent a first ray amputation of the right foot on 07/23/2011. Postoperatively patient progressed well with no complaints. He was discharged to home in stable condition on 07/24/2011. Prescription provided for Vicodin for pain. He will continue with his doxycycline.  Consults: None  Significant Diagnostic Studies: labs: Routine labs  Treatments: surgery: First ray amputation right foot  Discharge Exam: Blood pressure 116/63, pulse 55, temperature 97.3 F (36.3 C), temperature source Oral, resp. rate 18, height 6\' 1"  (1.854 m), weight 88.451 kg (195 lb), SpO2 100.00%. Incision/Wound: incision clean and dry at time of discharge.  Disposition: Home or Self Care  Discharge Orders    Future Appointments: Provider: Department: Dept Phone: Center:   07/26/2011 8:00 AM [facility] [phone] [facility]     Future Orders Please Complete By Expires   Diet - low sodium heart healthy      Call MD / Call 911      Comments:   If you experience chest pain or shortness of breath, CALL 911 and be transported to the hospital emergency room.  If you develope a fever above 101 F, pus (white drainage) or increased drainage or redness at the wound, or calf pain, call your surgeon's office.   Constipation Prevention      Comments:   Drink plenty of fluids.  Prune juice may be helpful.  You may use a stool softener, such as Colace (over the counter) 100 mg twice a day.  Use MiraLax (over the counter) for  constipation as needed.   Increase activity slowly as tolerated      Weight Bearing as taught in Physical Therapy      Comments:   Use a walker or crutches as instructed.     Medication List  As of 07/24/2011  6:30 AM   TAKE these medications         doxycycline 100 MG capsule   Commonly known as: VIBRAMYCIN   Take 100 mg by mouth 2 (two) times daily.      gabapentin 300 MG capsule   Commonly known as: NEURONTIN   Take 300 mg by mouth 3 (three) times daily.      glipiZIDE 5 MG tablet   Commonly known as: GLUCOTROL   Take 5 mg by mouth 2 (two) times daily before a meal.      HYDROcodone-acetaminophen 5-500 MG per tablet   Commonly known as: VICODIN   Take 1 tablet by mouth every 6 (six) hours as needed for pain.      lisinopril 20 MG tablet   Commonly known as: PRINIVIL,ZESTRIL   Take 20 mg by mouth daily.      metFORMIN 1000 MG tablet   Commonly known as: GLUCOPHAGE   Take 1,000 mg by mouth daily.           Follow-up Information    Follow up with DUDA,MARCUS V, MD in 1 week.   Contact information:   [address] [place] [address] [phone]          Signed: Nadara Mustard 07/24/2011, 6:30 AM

## 2011-07-24 NOTE — Progress Notes (Signed)
CARE MANAGEMENT NOTE 07/24/2011 Discharge planning. Ordered rolling walker for patient.

## 2011-07-26 ENCOUNTER — Encounter (HOSPITAL_BASED_OUTPATIENT_CLINIC_OR_DEPARTMENT_OTHER): Payer: Medicare Other

## 2011-07-26 DIAGNOSIS — M869 Osteomyelitis, unspecified: Secondary | ICD-10-CM | POA: Insufficient documentation

## 2011-07-29 ENCOUNTER — Encounter (HOSPITAL_BASED_OUTPATIENT_CLINIC_OR_DEPARTMENT_OTHER): Payer: Medicare Other

## 2011-08-26 ENCOUNTER — Encounter (HOSPITAL_BASED_OUTPATIENT_CLINIC_OR_DEPARTMENT_OTHER): Payer: Medicare Other | Attending: General Surgery

## 2011-08-26 DIAGNOSIS — E119 Type 2 diabetes mellitus without complications: Secondary | ICD-10-CM | POA: Insufficient documentation

## 2011-08-26 DIAGNOSIS — Z79899 Other long term (current) drug therapy: Secondary | ICD-10-CM | POA: Insufficient documentation

## 2011-08-26 DIAGNOSIS — S98139A Complete traumatic amputation of one unspecified lesser toe, initial encounter: Secondary | ICD-10-CM | POA: Insufficient documentation

## 2011-08-26 DIAGNOSIS — I739 Peripheral vascular disease, unspecified: Secondary | ICD-10-CM | POA: Insufficient documentation

## 2011-08-26 DIAGNOSIS — L97509 Non-pressure chronic ulcer of other part of unspecified foot with unspecified severity: Secondary | ICD-10-CM | POA: Insufficient documentation

## 2011-08-26 DIAGNOSIS — L84 Corns and callosities: Secondary | ICD-10-CM | POA: Insufficient documentation

## 2011-09-16 ENCOUNTER — Encounter (HOSPITAL_BASED_OUTPATIENT_CLINIC_OR_DEPARTMENT_OTHER): Payer: Medicare HMO | Attending: Internal Medicine

## 2011-09-16 DIAGNOSIS — E1169 Type 2 diabetes mellitus with other specified complication: Secondary | ICD-10-CM | POA: Insufficient documentation

## 2011-09-16 DIAGNOSIS — Z79899 Other long term (current) drug therapy: Secondary | ICD-10-CM | POA: Insufficient documentation

## 2011-09-16 DIAGNOSIS — L97509 Non-pressure chronic ulcer of other part of unspecified foot with unspecified severity: Secondary | ICD-10-CM | POA: Insufficient documentation

## 2011-09-23 ENCOUNTER — Encounter (HOSPITAL_BASED_OUTPATIENT_CLINIC_OR_DEPARTMENT_OTHER): Payer: Medicare Other

## 2011-09-30 ENCOUNTER — Ambulatory Visit (HOSPITAL_COMMUNITY)
Admission: RE | Admit: 2011-09-30 | Discharge: 2011-09-30 | Disposition: A | Discharge status: Home / Self Care | Payer: Medicare Other | Source: Ambulatory Visit | Attending: Internal Medicine | Admitting: Internal Medicine

## 2011-09-30 ENCOUNTER — Other Ambulatory Visit (HOSPITAL_BASED_OUTPATIENT_CLINIC_OR_DEPARTMENT_OTHER): Payer: Self-pay | Admitting: Internal Medicine

## 2011-09-30 DIAGNOSIS — M79673 Pain in unspecified foot: Secondary | ICD-10-CM

## 2011-09-30 DIAGNOSIS — L97509 Non-pressure chronic ulcer of other part of unspecified foot with unspecified severity: Secondary | ICD-10-CM | POA: Insufficient documentation

## 2011-10-21 ENCOUNTER — Encounter (HOSPITAL_BASED_OUTPATIENT_CLINIC_OR_DEPARTMENT_OTHER): Payer: Medicare Other | Attending: Internal Medicine

## 2011-10-21 DIAGNOSIS — E1169 Type 2 diabetes mellitus with other specified complication: Secondary | ICD-10-CM | POA: Insufficient documentation

## 2011-10-21 DIAGNOSIS — L97509 Non-pressure chronic ulcer of other part of unspecified foot with unspecified severity: Secondary | ICD-10-CM | POA: Insufficient documentation

## 2011-11-18 ENCOUNTER — Encounter (HOSPITAL_BASED_OUTPATIENT_CLINIC_OR_DEPARTMENT_OTHER): Payer: Medicare Other | Attending: Internal Medicine

## 2011-11-18 DIAGNOSIS — E1169 Type 2 diabetes mellitus with other specified complication: Secondary | ICD-10-CM | POA: Insufficient documentation

## 2011-11-18 DIAGNOSIS — L97509 Non-pressure chronic ulcer of other part of unspecified foot with unspecified severity: Secondary | ICD-10-CM | POA: Insufficient documentation

## 2011-12-22 ENCOUNTER — Emergency Department (HOSPITAL_COMMUNITY)
Admission: EM | Admit: 2011-12-22 | Discharge: 2011-12-22 | Disposition: A | Discharge status: Home Health | Payer: Medicare HMO | Attending: Emergency Medicine | Admitting: Emergency Medicine

## 2011-12-22 ENCOUNTER — Encounter (HOSPITAL_COMMUNITY): Payer: Self-pay | Admitting: *Deleted

## 2011-12-22 DIAGNOSIS — E1142 Type 2 diabetes mellitus with diabetic polyneuropathy: Secondary | ICD-10-CM | POA: Insufficient documentation

## 2011-12-22 DIAGNOSIS — A498 Other bacterial infections of unspecified site: Secondary | ICD-10-CM | POA: Insufficient documentation

## 2011-12-22 DIAGNOSIS — I10 Essential (primary) hypertension: Secondary | ICD-10-CM | POA: Insufficient documentation

## 2011-12-22 DIAGNOSIS — E1140 Type 2 diabetes mellitus with diabetic neuropathy, unspecified: Secondary | ICD-10-CM

## 2011-12-22 DIAGNOSIS — I739 Peripheral vascular disease, unspecified: Secondary | ICD-10-CM | POA: Insufficient documentation

## 2011-12-22 DIAGNOSIS — E1149 Type 2 diabetes mellitus with other diabetic neurological complication: Secondary | ICD-10-CM | POA: Insufficient documentation

## 2011-12-22 DIAGNOSIS — M129 Arthropathy, unspecified: Secondary | ICD-10-CM | POA: Insufficient documentation

## 2011-12-22 DIAGNOSIS — M79609 Pain in unspecified limb: Secondary | ICD-10-CM | POA: Insufficient documentation

## 2011-12-22 DIAGNOSIS — Z79899 Other long term (current) drug therapy: Secondary | ICD-10-CM | POA: Insufficient documentation

## 2011-12-22 DIAGNOSIS — R61 Generalized hyperhidrosis: Secondary | ICD-10-CM | POA: Insufficient documentation

## 2011-12-22 MED ORDER — OXYCODONE-ACETAMINOPHEN 5-325 MG PO TABS: 1.0000 | ORAL_TABLET | ORAL | Status: AC | PRN

## 2011-12-22 NOTE — ED Notes (Signed)
Pt reports chronic bilateral lower leg and foot pain, denies injury to legs. Ambulatory at triage.

## 2011-12-22 NOTE — ED Provider Notes (Signed)
History   This chart was scribed for Dione Booze, MD scribed by Magnus Sinning. The patient was seen in room TR10C/TR10C seen at 11:20.    CSN: [id number]  Arrival date & time 12/22/11  1058   None     Chief Complaint  Patient presents with  . Leg Pain    (Consider location/radiation/quality/duration/timing/severity/associated sxs/prior treatment) HPI [name] is a 71 y.o. male who presents to the Emergency Department complaining of gradually worsening moderate stinging  burning bilateral lower leg and foot pain, which he states starts from the knee down, onset 3 months. Patient states he decided to present to the ED because he was unable to sleep. He explains he has been taking four 300 mg gabapentin a day and tylenol with no improvement. Patient currently states that pain is a 10/10 on pain scale. Patient denies fever, chills, but says he did experience sweats a few nights ago. Patient is a current smoker.  Past Medical History  Diagnosis Date  . Osteomyelitis     Right great toe  . Hypertension   . Arthritis   . Diabetes mellitus     type 2  . Peripheral vascular disease     diabetic  with osteomylitis    Past Surgical History  Procedure Date  . L foot surgery 2006  . Circumcision 2010  . Foot fracture surgery   . Left arm  plate   . Right great toe amputation 07/23/2011  . Amputation 07/23/2011    Procedure: AMPUTATION DIGIT;  Surgeon: Nadara Mustard, MD;  Location: [facility];  Service: Orthopedics;  Laterality: Right;  Right Great Toe Amputation    Family History  Problem Relation Age of Onset  . Anesthesia problems Neg Hx     History  Substance Use Topics  . Smoking status: Current Everyday Smoker -- 5 years    Types: Pipe  . Smokeless tobacco: Never Used  . Alcohol Use: No      Review of Systems  Constitutional: Positive for diaphoresis. Negative for fever and chills.  Musculoskeletal:       Leg pain    Allergies  Review of patient's allergies indicates  no known allergies.  Home Medications   Current Outpatient Rx  Name Route Sig Dispense Refill  . VITAMIN B-12 PO Oral Take 1 tablet by mouth daily.    Marland Kitchen GABAPENTIN 300 MG PO CAPS Oral Take 300 mg by mouth 3 (three) times daily.     Marland Kitchen GLIPIZIDE 5 MG PO TABS Oral Take 5 mg by mouth 2 (two) times daily before a meal.     . LISINOPRIL 20 MG PO TABS Oral Take 20 mg by mouth daily.    Marland Kitchen METFORMIN HCL 1000 MG PO TABS Oral Take 1,000 mg by mouth daily.    Marland Kitchen VITAMIN B-6 100 MG PO TABS Oral Take 100 mg by mouth daily.      BP 119/76  Pulse 89  Temp 98.5 F (36.9 C) (Oral)  Resp 18  SpO2 99%  Physical Exam  Nursing note and vitals reviewed. Constitutional: He is oriented to person, place, and time. He appears well-developed and well-nourished. No distress.  HENT:  Head: Normocephalic and atraumatic.  Eyes: EOM are normal.  Neck: Neck supple. No tracheal deviation present.  Cardiovascular: Normal rate and regular rhythm.   Pulmonary/Chest: Effort normal. No respiratory distress.  Musculoskeletal: Normal range of motion.       Status post amputation of right first toe: Skin lesions necrobiosis lipoidica  diabeticorum    Neurological: He is alert and oriented to person, place, and time.       Decreased sensation over hands and feet. Stocking and glove distribution consistent with diabetic peripheral neuropathy.   Skin: Skin is warm and dry.  Psychiatric: He has a normal mood and affect. His behavior is normal.    ED Course  Procedures (including critical care time) DIAGNOSTIC STUDIES: Oxygen Saturation is 99% on room air, normal by my interpretation.    COORDINATION OF CARE:    1. Painful diabetic neuropathy       MDM  Diabetic painful neuropathy. He is sent home with prescription for Percocet and is to followup with his PCP. He may need to have his gabapentin dose titrated upward.   I personally performed the services described in this documentation, which was scribed in my  presence. The recorded information has been reviewed and considered.          Dione Booze, MD 12/26/11 [phone]

## 2012-01-04 ENCOUNTER — Emergency Department (HOSPITAL_COMMUNITY)
Admission: EM | Admit: 2012-01-04 | Discharge: 2012-01-04 | Disposition: A | Discharge status: Home / Self Care | Payer: Medicare HMO | Attending: Emergency Medicine | Admitting: Emergency Medicine

## 2012-01-04 ENCOUNTER — Encounter (HOSPITAL_COMMUNITY): Payer: Self-pay | Admitting: Physical Medicine and Rehabilitation

## 2012-01-04 DIAGNOSIS — E1149 Type 2 diabetes mellitus with other diabetic neurological complication: Secondary | ICD-10-CM | POA: Insufficient documentation

## 2012-01-04 DIAGNOSIS — M129 Arthropathy, unspecified: Secondary | ICD-10-CM | POA: Insufficient documentation

## 2012-01-04 DIAGNOSIS — E1142 Type 2 diabetes mellitus with diabetic polyneuropathy: Secondary | ICD-10-CM | POA: Insufficient documentation

## 2012-01-04 DIAGNOSIS — Z87891 Personal history of nicotine dependence: Secondary | ICD-10-CM | POA: Insufficient documentation

## 2012-01-04 DIAGNOSIS — G629 Polyneuropathy, unspecified: Secondary | ICD-10-CM

## 2012-01-04 DIAGNOSIS — I10 Essential (primary) hypertension: Secondary | ICD-10-CM | POA: Insufficient documentation

## 2012-01-04 MED ORDER — OXYCODONE-ACETAMINOPHEN 5-325 MG PO TABS: 2.0000 | ORAL_TABLET | ORAL | Status: AC | PRN

## 2012-01-04 MED ORDER — GABAPENTIN 300 MG PO CAPS: 300.0000 mg | ORAL_CAPSULE | Freq: Three times a day (TID) | ORAL | Status: DC

## 2012-01-04 NOTE — ED Notes (Addendum)
Pt presents to department for evaluation of chronic leg pain. Was seen for same recently and prescribed pain medication, no relief. Ongoing x4 months. Pt states pain became worse today, rating 10/10 upon arrival. Pt ambulatory to triage. Denies recent injury. He is conscious alert and oriented x4. No signs of acute distress noted.

## 2012-01-04 NOTE — ED Provider Notes (Signed)
History  Scribed for Shelda Jakes, MD, the patient was seen in room TR07C/TR07C. This chart was scribed by Candelaria Stagers. The patient's care started at 2:40 PM   CSN: [id number]  Arrival date & time 01/04/12  1244   First MD Initiated Contact with Patient 01/04/12 1424      Chief Complaint  Patient presents with  . Leg Pain     The history is provided by the patient.   [name] is a 71 y.o. male who presents to the Emergency Department complaining of chronic bilateral leg pain that he has been experiencing for about two years and is worse at night.  Pt reports that he has been told he has neuropathy and was seen in the ED about one month ago for the same sx and was given pain medication.  He reports no relief from the medication.  Pt reports he does not have a PCP at this time.  He is currently taking neurontin that he has taken for about one year.  He states that he ran out of the neurontin a few months ago.  Pt has h/o diabetes.  Past Medical History  Diagnosis Date  . Osteomyelitis     Right great toe  . Hypertension   . Arthritis   . Diabetes mellitus     type 2  . Peripheral vascular disease     diabetic  with osteomylitis    Past Surgical History  Procedure Date  . L foot surgery 2006  . Circumcision 2010  . Foot fracture surgery   . Left arm  plate   . Right great toe amputation 07/23/2011  . Amputation 07/23/2011    Procedure: AMPUTATION DIGIT;  Surgeon: Nadara Mustard, MD;  Location: [facility] OR;  Service: Orthopedics;  Laterality: Right;  Right Great Toe Amputation    Family History  Problem Relation Age of Onset  . Anesthesia problems Neg Hx     History  Substance Use Topics  . Smoking status: Former Smoker -- 5 years  . Smokeless tobacco: Never Used  . Alcohol Use: No      Review of Systems  Constitutional: Negative for fever.  Respiratory: Negative for cough.   Cardiovascular: Negative for chest pain.  Gastrointestinal: Negative for nausea,  vomiting, abdominal pain and diarrhea.  Genitourinary: Negative for dysuria.  Musculoskeletal: Positive for arthralgias (bilateral leg pain).  Neurological: Positive for numbness (to the right foot).    Allergies  Review of patient's allergies indicates no known allergies.  Home Medications   Current Outpatient Rx  Name Route Sig Dispense Refill  . VITAMIN B-12 PO Oral Take 1 tablet by mouth daily.    Marland Kitchen GABAPENTIN 300 MG PO CAPS Oral Take 300-600 mg by mouth 3 (three) times daily. Take 2 capsules (600mg ) in the morning; 1 capsule at lunch (300mg ); 2 capsules (600mg ) at dinner    . GLIPIZIDE 5 MG PO TABS Oral Take 5 mg by mouth daily.     Marland Kitchen LISINOPRIL 20 MG PO TABS Oral Take 20 mg by mouth daily.    Marland Kitchen METFORMIN HCL 1000 MG PO TABS Oral Take 1,000 mg by mouth daily.    . OXYCODONE-ACETAMINOPHEN 10-650 MG PO TABS Oral Take 0.5 tablets by mouth every 4 (four) hours as needed. For pain    . VITAMIN B-6 100 MG PO TABS Oral Take 100 mg by mouth daily.    Marland Kitchen GABAPENTIN 300 MG PO CAPS Oral Take 1 capsule (300 mg total) by mouth  3 (three) times daily. 90 capsule 0  . OXYCODONE-ACETAMINOPHEN 5-325 MG PO TABS Oral Take 2 tablets by mouth every 4 (four) hours as needed for pain. 20 tablet 0    BP 132/68  Pulse 79  Temp 98.5 F (36.9 C) (Oral)  Resp 19  SpO2 97%  Physical Exam  Nursing note and vitals reviewed. Constitutional: He is oriented to person, place, and time. He appears well-developed and well-nourished. No distress.  HENT:  Head: Normocephalic and atraumatic.  Eyes: EOM are normal. Pupils are equal, round, and reactive to light.  Cardiovascular: Normal rate and regular rhythm.   No murmur heard. Pulmonary/Chest: Breath sounds normal. He has no wheezes. He has no rales.  Abdominal: Bowel sounds are normal. There is no tenderness.  Musculoskeletal: Normal range of motion.       Right foot missing big toe.  Left DP pulse 2+.  Right DP 1+.  Cap refill 1 sec.  Decreased sensation  throughout the left foot.  Good sensation to the right foot.   Neurological: He is alert and oriented to person, place, and time. No cranial nerve deficit.  Skin: Skin is warm and dry. He is not diaphoretic.  Psychiatric: He has a normal mood and affect. His behavior is normal.    ED Course  Procedures   DIAGNOSTIC STUDIES: Oxygen Saturation is 97% on room air, normal by my interpretation.    COORDINATION OF CARE:    Labs Reviewed - No data to display No results found.   1. Neuropathy       MDM  Patient symptoms consistent with neuropathy has good blood flow to both feet no evidence of vascular insufficiency based on exam we'll renew Neurontin and supplement with pain medicines resource guide provided him find a primary care Dr.  I personally performed the services described in this documentation, which was scribed in my presence. The recorded information has been reviewed and considered.         Shelda Jakes, MD 01/04/12 [phone]

## 2012-03-20 ENCOUNTER — Other Ambulatory Visit (HOSPITAL_BASED_OUTPATIENT_CLINIC_OR_DEPARTMENT_OTHER): Payer: Self-pay | Admitting: General Surgery

## 2012-03-20 ENCOUNTER — Ambulatory Visit (HOSPITAL_COMMUNITY)
Admission: RE | Admit: 2012-03-20 | Discharge: 2012-03-20 | Disposition: A | Discharge status: Home / Self Care | Payer: Medicare HMO | Source: Ambulatory Visit | Attending: General Surgery | Admitting: General Surgery

## 2012-03-20 ENCOUNTER — Encounter (HOSPITAL_BASED_OUTPATIENT_CLINIC_OR_DEPARTMENT_OTHER): Payer: Medicare HMO | Attending: General Surgery

## 2012-03-20 DIAGNOSIS — IMO0002 Reserved for concepts with insufficient information to code with codable children: Secondary | ICD-10-CM | POA: Insufficient documentation

## 2012-03-20 DIAGNOSIS — I10 Essential (primary) hypertension: Secondary | ICD-10-CM | POA: Insufficient documentation

## 2012-03-20 DIAGNOSIS — S98119A Complete traumatic amputation of unspecified great toe, initial encounter: Secondary | ICD-10-CM | POA: Insufficient documentation

## 2012-03-20 DIAGNOSIS — E1169 Type 2 diabetes mellitus with other specified complication: Secondary | ICD-10-CM | POA: Insufficient documentation

## 2012-03-20 DIAGNOSIS — M79609 Pain in unspecified limb: Secondary | ICD-10-CM | POA: Insufficient documentation

## 2012-03-20 DIAGNOSIS — I739 Peripheral vascular disease, unspecified: Secondary | ICD-10-CM | POA: Insufficient documentation

## 2012-03-20 DIAGNOSIS — L97509 Non-pressure chronic ulcer of other part of unspecified foot with unspecified severity: Secondary | ICD-10-CM | POA: Insufficient documentation

## 2012-03-20 DIAGNOSIS — M24676 Ankylosis, unspecified foot: Secondary | ICD-10-CM | POA: Insufficient documentation

## 2012-03-20 DIAGNOSIS — M79673 Pain in unspecified foot: Secondary | ICD-10-CM

## 2012-03-20 DIAGNOSIS — Z79899 Other long term (current) drug therapy: Secondary | ICD-10-CM | POA: Insufficient documentation

## 2012-03-20 DIAGNOSIS — M24673 Ankylosis, unspecified ankle: Secondary | ICD-10-CM | POA: Insufficient documentation

## 2012-03-20 NOTE — H&P (Signed)
NAMEMARISA, [name]                ACCOUNT NO.:  [id number]  MEDICAL RECORD NO.:  [id number]  LOCATION:  FOOT                         FACILITY:  [facility]  PHYSICIAN:  Joanne Gavel, M.D.        DATE OF BIRTH:  22-Jun-1940  DATE OF ADMISSION:  03/20/2012 DATE OF DISCHARGE:                             HISTORY & PHYSICAL   CHIEF COMPLAINT:  Wound, left foot and leg.  HISTORY OF PRESENT ILLNESS:  This patient is a long-term diabetic.  He has profound neuropathy.  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  He has been complaining lately of wound pain, although there has been a wound on lateral leg for approximately 4 months which he has not treated.  He also has a long history of a sizable foot deformity and has an ulcer on the plantar surface of the left foot.  PAST MEDICAL HISTORY:  Significant for hypertension, diabetes, and PVD. He smokes a pipe.  Drinks alcohol occasionally.  ALLERGIES:  None.  MEDICATIONS:  Glipizide, metformin, lisinopril, tramadol and gabapentin.  PAST SURGICAL HISTORY:  He has had back surgery and a right great toe amputation.  REVIEW OF SYSTEMS:  Essentially negative.  PHYSICAL EXAMINATION:  VITAL SIGNS:  Temperature 98.1, pulse 68, respirations 18, blood pressure 127/79.  Glucose is 105. GENERAL:  A well developed, well nourished, no distress. HEENT:  Cranium, normocephalic. CHEST:  Clear. HEART:  Regular rhythm. ABDOMEN:  Not examined. EXTREMITIES:  Right foot is not examined.  Left foot, there are good pulses.  The foot is very flat with no arch.  There is a scar transversely near the left ankle where he underwent some sort of reconstructive surgery years ago.  There is a 1.7 x 1.0 very superficial abrasive-type wound in the left lateral lower extremity, a sizable callus is excised resulting in a relatively deep 0.3 cm deep ulcer, 0.7 x 0.5 on the plantar surface of the foot.  IMPRESSION:  Diabetic foot  ulcer with possible diabetic ulcers of the leg.  PLAN OF TREATMENT:  Start with Santyl, Hydrogel, doughnut and healing sandal.  We have discussed the possibility of HBO and total contact casting and thus far he has refused to consider these options.  We will see him in 7 days.  Laboratory work and x-ray have been ordered.     Joanne Gavel, M.D.     RA/MEDQ  D:  03/20/2012  T:  03/20/2012  Job:  [id number]

## 2012-04-09 ENCOUNTER — Encounter: Payer: Medicare HMO | Attending: Plastic Surgery | Admitting: *Deleted

## 2012-04-09 DIAGNOSIS — E119 Type 2 diabetes mellitus without complications: Secondary | ICD-10-CM | POA: Insufficient documentation

## 2012-04-09 DIAGNOSIS — Z713 Dietary counseling and surveillance: Secondary | ICD-10-CM | POA: Insufficient documentation

## 2012-04-10 NOTE — Progress Notes (Signed)
Patient attended 90 minute Diabetes Education Class on 04/09/2012  Pre-Class Assessment was completed by the patient and scored.  Topics taught include:  Physiology of Diabetes  Complications and prevention  Monitoring of BG and Problem Solving Techniques  Carb Counting and Healthy Eating Habits  Physical Activity planning and benefits  Medication actions and management  Risk Factor and Stress Management  Sick Day and Travel planning  Behavior Plan Development  Follow up class is scheduled in 4 months to assess diabetes management, healthy eating habits and weight management.   

## 2012-04-17 ENCOUNTER — Encounter (HOSPITAL_BASED_OUTPATIENT_CLINIC_OR_DEPARTMENT_OTHER): Payer: Medicare HMO

## 2012-04-25 ENCOUNTER — Emergency Department (HOSPITAL_COMMUNITY)
Admission: EM | Admit: 2012-04-25 | Discharge: 2012-04-25 | Disposition: A | Discharge status: Home Health | Payer: Medicare HMO | Attending: Emergency Medicine | Admitting: Emergency Medicine

## 2012-04-25 ENCOUNTER — Encounter (HOSPITAL_COMMUNITY): Payer: Self-pay | Admitting: Physical Medicine and Rehabilitation

## 2012-04-25 DIAGNOSIS — Z8679 Personal history of other diseases of the circulatory system: Secondary | ICD-10-CM | POA: Insufficient documentation

## 2012-04-25 DIAGNOSIS — Z87891 Personal history of nicotine dependence: Secondary | ICD-10-CM | POA: Insufficient documentation

## 2012-04-25 DIAGNOSIS — Z8739 Personal history of other diseases of the musculoskeletal system and connective tissue: Secondary | ICD-10-CM | POA: Insufficient documentation

## 2012-04-25 DIAGNOSIS — L853 Xerosis cutis: Secondary | ICD-10-CM

## 2012-04-25 DIAGNOSIS — L259 Unspecified contact dermatitis, unspecified cause: Secondary | ICD-10-CM | POA: Insufficient documentation

## 2012-04-25 DIAGNOSIS — E1149 Type 2 diabetes mellitus with other diabetic neurological complication: Secondary | ICD-10-CM | POA: Insufficient documentation

## 2012-04-25 DIAGNOSIS — Z79899 Other long term (current) drug therapy: Secondary | ICD-10-CM | POA: Insufficient documentation

## 2012-04-25 DIAGNOSIS — I10 Essential (primary) hypertension: Secondary | ICD-10-CM | POA: Insufficient documentation

## 2012-04-25 DIAGNOSIS — E1142 Type 2 diabetes mellitus with diabetic polyneuropathy: Secondary | ICD-10-CM | POA: Insufficient documentation

## 2012-04-25 DIAGNOSIS — E1140 Type 2 diabetes mellitus with diabetic neuropathy, unspecified: Secondary | ICD-10-CM

## 2012-04-25 MED ORDER — DIPHENHYDRAMINE HCL 25 MG PO CAPS: 25.0000 mg | ORAL_CAPSULE | Freq: Four times a day (QID) | ORAL | Status: DC | PRN

## 2012-04-25 MED ORDER — TRIAMCINOLONE 0.1 % CREAM:EUCERIN CREAM 1:1
TOPICAL_CREAM | Freq: Three times a day (TID) | CUTANEOUS | Status: DC | PRN
  Filled 2012-04-25: qty 1

## 2012-04-25 NOTE — ED Notes (Signed)
Pt presents to department for evaluation of bilateral leg pain. States chronic problems with neuropathy. States both legs are sore and itchy. 5/10 pain at the time. No signs of distress noted. Ambulatory to triage.

## 2012-04-25 NOTE — ED Notes (Signed)
Pharmacy called, they are mixing pt medicine

## 2012-04-25 NOTE — ED Provider Notes (Addendum)
History    This chart was scribed for Gwyneth Sprout, MD, MD by Smitty Pluck, ED Scribe. The patient was seen in room TR10C and the patient's care was started at 1:23PM.   CSN: [id number]  Arrival date & time 04/25/12  1253      Chief Complaint  Patient presents with  . Leg Pain    (Consider location/radiation/quality/duration/timing/severity/associated sxs/prior treatment) Patient is a 71 y.o. male presenting with leg pain. The history is provided by the patient. No language interpreter was used.  Leg Pain    [name] is a 71 y.o. male with hx of DM and neuropathy who presents to the Emergency Department complaining of constant, moderate bilateral leg pain onset 2-3 months ago. Pt reports that he has itching of bilateral legs and that he has scratched causing scabs on leg. Pt reports pain is rated at 5/10. Denies any other pain. CBG is 110 at home.   PCP is Dr. Clent Ridges   Past Medical History  Diagnosis Date  . Osteomyelitis     Right great toe  . Hypertension   . Arthritis   . Diabetes mellitus     type 2  . Peripheral vascular disease     diabetic  with osteomylitis    Past Surgical History  Procedure Date  . L foot surgery 2006  . Circumcision 2010  . Foot fracture surgery   . Left arm  plate   . Right great toe amputation 07/23/2011  . Amputation 07/23/2011    Procedure: AMPUTATION DIGIT;  Surgeon: Nadara Mustard, MD;  Location: [facility] OR;  Service: Orthopedics;  Laterality: Right;  Right Great Toe Amputation    Family History  Problem Relation Age of Onset  . Anesthesia problems Neg Hx     History  Substance Use Topics  . Smoking status: Former Smoker -- 5 years  . Smokeless tobacco: Never Used  . Alcohol Use: No      Review of Systems  Constitutional: Negative for fever and chills.  Respiratory: Negative for shortness of breath.   Gastrointestinal: Negative for nausea and vomiting.  Neurological: Negative for weakness.  All other systems reviewed and  are negative.    Allergies  Review of patient's allergies indicates no known allergies.  Home Medications   Current Outpatient Rx  Name  Route  Sig  Dispense  Refill  . VITAMIN B-12 PO   Oral   Take 1 tablet by mouth daily.         Marland Kitchen GABAPENTIN 300 MG PO CAPS   Oral   Take 300-600 mg by mouth 3 (three) times daily. Take 2 capsules (600mg ) in the morning; 1 capsule at lunch (300mg ); 2 capsules (600mg ) at dinner         . GABAPENTIN 300 MG PO CAPS   Oral   Take 1 capsule (300 mg total) by mouth 3 (three) times daily.   90 capsule   0   . GLIPIZIDE 5 MG PO TABS   Oral   Take 5 mg by mouth daily.          Marland Kitchen LISINOPRIL 20 MG PO TABS   Oral   Take 20 mg by mouth daily.         Marland Kitchen METFORMIN HCL 1000 MG PO TABS   Oral   Take 1,000 mg by mouth daily.         . OXYCODONE-ACETAMINOPHEN 10-650 MG PO TABS   Oral   Take 0.5 tablets by mouth every  4 (four) hours as needed. For pain         . VITAMIN B-6 100 MG PO TABS   Oral   Take 100 mg by mouth daily.           BP 145/75  Pulse 78  Temp 98.3 F (36.8 C) (Oral)  Resp 18  SpO2 99%  Physical Exam  Nursing note and vitals reviewed. Constitutional: He is oriented to person, place, and time. He appears well-developed and well-nourished. No distress.  HENT:  Head: Normocephalic and atraumatic.  Eyes: EOM are normal.  Neck: Neck supple. No tracheal deviation present.  Cardiovascular: Normal rate, regular rhythm and normal heart sounds.   Pulmonary/Chest: Effort normal. No respiratory distress.  Musculoskeletal: Normal range of motion.       Excoriated, severely dry irritated skin from medial thigh down to feet Healing lesions from itching  No signs of abscess, fluctuance or drainage No diabetes ulcers +2 pulses in distal extremities  decreased sensation of bilateral feet   Neurological: He is alert and oriented to person, place, and time.  Skin: Skin is warm and dry.  Psychiatric: He has a normal mood  and affect. His behavior is normal.    ED Course  Procedures (including critical care time) DIAGNOSTIC STUDIES: Oxygen Saturation is 99% on room air, normal by my interpretation.    COORDINATION OF CARE: 1:25 PM Discussed ED treatment with pt    Labs Reviewed - No data to display No results found.   1. Dry skin dermatitis   2. Diabetic neuropathy       MDM   Patient coming in complaining of itching and irritation of bilateral lower chart his worse below the knee. Patient has a history of chronic neuropathy and leg pain. However today he is complaining more of irritation and itching. He has healed lesions all the way down his leg but no sign of infection. He is excoriated areas diffusely over the lower extremities. He has 2+ pulses bilaterally without any swelling, erythema or fluctuance. Feel that the sensation he is describing is either from severe dryness and irritation to the skin versus neuropathy. Patient was told to take Benadryl when necessary for itching and was given triamcinolone/use urine to use on the legs. He will followup with his PCP at family practice next week if the symptoms are not improved.   I personally performed the services described in this documentation, which was scribed in my presence.  The recorded information has been reviewed and considered.       Gwyneth Sprout, MD 04/25/12 1350  Gwyneth Sprout, MD 04/25/12 1352

## 2012-05-30 ENCOUNTER — Encounter (HOSPITAL_COMMUNITY): Payer: Self-pay | Admitting: Family Medicine

## 2012-05-30 ENCOUNTER — Emergency Department (HOSPITAL_COMMUNITY)
Admission: EM | Admit: 2012-05-30 | Discharge: 2012-05-30 | Disposition: A | Discharge status: Home / Self Care | Payer: Medicare HMO | Attending: Emergency Medicine | Admitting: Emergency Medicine

## 2012-05-30 DIAGNOSIS — Z8739 Personal history of other diseases of the musculoskeletal system and connective tissue: Secondary | ICD-10-CM | POA: Insufficient documentation

## 2012-05-30 DIAGNOSIS — E1140 Type 2 diabetes mellitus with diabetic neuropathy, unspecified: Secondary | ICD-10-CM

## 2012-05-30 DIAGNOSIS — E1142 Type 2 diabetes mellitus with diabetic polyneuropathy: Secondary | ICD-10-CM | POA: Insufficient documentation

## 2012-05-30 DIAGNOSIS — Z79899 Other long term (current) drug therapy: Secondary | ICD-10-CM | POA: Insufficient documentation

## 2012-05-30 DIAGNOSIS — M79609 Pain in unspecified limb: Secondary | ICD-10-CM | POA: Insufficient documentation

## 2012-05-30 DIAGNOSIS — Z8679 Personal history of other diseases of the circulatory system: Secondary | ICD-10-CM | POA: Insufficient documentation

## 2012-05-30 DIAGNOSIS — M79606 Pain in leg, unspecified: Secondary | ICD-10-CM

## 2012-05-30 DIAGNOSIS — I10 Essential (primary) hypertension: Secondary | ICD-10-CM | POA: Insufficient documentation

## 2012-05-30 DIAGNOSIS — Z87891 Personal history of nicotine dependence: Secondary | ICD-10-CM | POA: Insufficient documentation

## 2012-05-30 DIAGNOSIS — E1149 Type 2 diabetes mellitus with other diabetic neurological complication: Secondary | ICD-10-CM | POA: Insufficient documentation

## 2012-05-30 MED ORDER — TRAMADOL HCL 50 MG PO TABS: 50.0000 mg | ORAL_TABLET | Freq: Four times a day (QID) | ORAL | Status: DC | PRN

## 2012-05-30 NOTE — ED Provider Notes (Signed)
History  Scribed for Suzi Roots, MD, the patient was seen in room TR05C/TR05C. This chart was scribed by Candelaria Stagers. The patient's care started at 11:11 AM   CSN: [id number]  Arrival date & time 05/30/12  1007   First MD Initiated Contact with Patient 05/30/12 1108      Chief Complaint  Patient presents with  . Leg Pain     The history is provided by the patient. No language interpreter was used.   [name] is a 71 y.o. male who presents to the Emergency Department complaining of bilateral leg pain that is worse on the right than the left that has been worse over the last three months.  Pt states that the pain is experienced mostly at night.  He describes the pain as a constant dull ache.  He also describes numbness to the right lower leg.  Pt is taking gabapentin and tylenol with no relief.  Pt has h/o diabetes.    States hx diabetic neuropathy and same nerve pain in past for years, no recent or abrupt change. No numbness/weakness. No swelling. No fevers. No radicular pain. Not worse w walking, no claudication.    Past Medical History  Diagnosis Date  . Osteomyelitis     Right great toe  . Hypertension   . Arthritis   . Diabetes mellitus     type 2  . Peripheral vascular disease     diabetic  with osteomylitis    Past Surgical History  Procedure Date  . L foot surgery 2006  . Circumcision 2010  . Foot fracture surgery   . Left arm  plate   . Right great toe amputation 07/23/2011  . Amputation 07/23/2011    Procedure: AMPUTATION DIGIT;  Surgeon: Nadara Mustard, MD;  Location: [facility] OR;  Service: Orthopedics;  Laterality: Right;  Right Great Toe Amputation    Family History  Problem Relation Age of Onset  . Anesthesia problems Neg Hx     History  Substance Use Topics  . Smoking status: Former Smoker -- 5 years  . Smokeless tobacco: Never Used  . Alcohol Use: No      Review of Systems  Constitutional: Negative for fever and chills.  Musculoskeletal:  Positive for arthralgias (bilateral leg pain).  Neurological: Negative for weakness and numbness.  All other systems reviewed and are negative.    Allergies  Review of patient's allergies indicates no known allergies.  Home Medications   Current Outpatient Rx  Name  Route  Sig  Dispense  Refill  . VITAMIN B-12 PO   Oral   Take 1 tablet by mouth daily.         Marland Kitchen GABAPENTIN 300 MG PO CAPS   Oral   Take 600 mg by mouth 2 (two) times daily.          Marland Kitchen GLIPIZIDE 5 MG PO TABS   Oral   Take 5 mg by mouth daily.          Marland Kitchen LISINOPRIL 20 MG PO TABS   Oral   Take 20 mg by mouth daily.         Marland Kitchen METFORMIN HCL 1000 MG PO TABS   Oral   Take 1,000 mg by mouth 2 (two) times daily with a meal.          . VITAMIN B-6 100 MG PO TABS   Oral   Take 100 mg by mouth daily.  BP 128/71  Pulse 77  Temp 98 F (36.7 C) (Oral)  Resp 22  SpO2 98%  Physical Exam  Nursing note and vitals reviewed. Constitutional: He is oriented to person, place, and time. He appears well-developed and well-nourished. No distress.  HENT:  Head: Normocephalic and atraumatic.  Eyes: EOM are normal.  Neck: Neck supple. No tracheal deviation present.  Cardiovascular: Normal rate.   Pulmonary/Chest: Effort normal. No respiratory distress.  Musculoskeletal: Normal range of motion. He exhibits no edema and no tenderness.       No sts. No skin changes or erythema. Distal pulses palp. Good rom at knee and ankle without pain  Neurological: He is alert and oriented to person, place, and time.       Motor intact bil. Ambulates w steady gait.   Skin: Skin is warm and dry.  Psychiatric: He has a normal mood and affect. His behavior is normal.    ED Course  Procedures   DIAGNOSTIC STUDIES: Oxygen Saturation is 98% on room air, normal by my interpretation.    COORDINATION OF CARE:        MDM  I personally performed the services described in this documentation, which was scribed in  my presence. The recorded information has been reviewed and is accurate.  Pt with chronic bil leg pain, states hx same. No recent injury or change in pain. No sign of infection or swelling. No neuro deficit, good pulses.   Pt requests pain rx for home, rx ultram.   Discussed need pcp f/u.          Suzi Roots, MD 05/30/12 1126

## 2012-05-30 NOTE — ED Notes (Signed)
Per pt sts 3 years of bilateral leg pain. sts hx of neuropathy and it has been really bad over the last 3 months.

## 2012-06-15 ENCOUNTER — Encounter (HOSPITAL_COMMUNITY): Payer: Self-pay | Admitting: Nurse Practitioner

## 2012-06-15 ENCOUNTER — Emergency Department (HOSPITAL_COMMUNITY)
Admission: EM | Admit: 2012-06-15 | Discharge: 2012-06-15 | Disposition: A | Discharge status: Home / Self Care | Payer: Medicare HMO | Attending: Emergency Medicine | Admitting: Emergency Medicine

## 2012-06-15 DIAGNOSIS — G894 Chronic pain syndrome: Secondary | ICD-10-CM

## 2012-06-15 DIAGNOSIS — I739 Peripheral vascular disease, unspecified: Secondary | ICD-10-CM | POA: Insufficient documentation

## 2012-06-15 DIAGNOSIS — I10 Essential (primary) hypertension: Secondary | ICD-10-CM | POA: Insufficient documentation

## 2012-06-15 DIAGNOSIS — M869 Osteomyelitis, unspecified: Secondary | ICD-10-CM | POA: Insufficient documentation

## 2012-06-15 DIAGNOSIS — G8929 Other chronic pain: Secondary | ICD-10-CM | POA: Insufficient documentation

## 2012-06-15 DIAGNOSIS — E1169 Type 2 diabetes mellitus with other specified complication: Secondary | ICD-10-CM | POA: Insufficient documentation

## 2012-06-15 DIAGNOSIS — Z87891 Personal history of nicotine dependence: Secondary | ICD-10-CM | POA: Insufficient documentation

## 2012-06-15 DIAGNOSIS — Z79899 Other long term (current) drug therapy: Secondary | ICD-10-CM | POA: Insufficient documentation

## 2012-06-15 LAB — CBC
HCT: 43.1 % (ref 39.0–52.0)
Hemoglobin: 14.9 g/dL (ref 13.0–17.0)
MCH: 29 pg (ref 26.0–34.0)
MCHC: 34.6 g/dL (ref 30.0–36.0)
MCV: 83.9 fL (ref 78.0–100.0)
Platelets: 256 10*3/uL (ref 150–400)
RBC: 5.14 MIL/uL (ref 4.22–5.81)
RDW: 14.2 % (ref 11.5–15.5)
WBC: 6.6 10*3/uL (ref 4.0–10.5)

## 2012-06-15 LAB — RPR: RPR Ser Ql: NONREACTIVE

## 2012-06-15 LAB — SEDIMENTATION RATE: Sed Rate: 19 mm/hr — ABNORMAL HIGH (ref 0–16)

## 2012-06-15 MED ORDER — HYDROCODONE-ACETAMINOPHEN 5-325 MG PO TABS
2.0000 | ORAL_TABLET | Freq: Once | ORAL | Status: AC
  Administered 2012-06-15: 2 via ORAL
  Filled 2012-06-15: qty 2

## 2012-06-15 MED ORDER — GABAPENTIN 300 MG PO CAPS: ORAL_CAPSULE | ORAL | Status: DC

## 2012-06-15 MED ORDER — HYDROCODONE-ACETAMINOPHEN 5-325 MG PO TABS: 2.0000 | ORAL_TABLET | ORAL | Status: DC | PRN

## 2012-06-15 NOTE — ED Notes (Signed)
Pt with BLE pain for one year, history neuropathy and gout. Pt was given tramadol for pain but states "that's not helping." ambulatory on scene. MAE. VSS

## 2012-06-15 NOTE — ED Notes (Signed)
Pt c/o bilateral lower extremity pain. Hx of chronic neuropathy.

## 2012-06-15 NOTE — ED Provider Notes (Signed)
History   This chart was scribed for Nelia Shi, MD, by Frederik Pear, ER scribe. The patient was seen in room TR05C/TR05C and the patient's care was started at 1004.    CSN: [id number]  Arrival date & time 06/15/12  1000   First MD Initiated Contact with Patient 06/15/12 1004      Chief Complaint  Patient presents with  . Leg Pain    (Consider location/radiation/quality/duration/timing/severity/associated sxs/prior treatment) HPI  [name] is a 71 y.o. male with a h/o of neuropathy, DM, and gout who presents to the Emergency Department complaining of constant, moderate bilateral leg neuropathy that is worse at night. He states that the current symptoms began 3 months ago, but he has a h/o of similar pain for the past year. He states that he has been taking 50 mg tablets 2x daily of Tramadol and 300 mg 5x  daily gabapentin that is not relieving the symptoms. In ED, he is ambulatory. He also complains of a constant, dark skins lesions to his bilateral legs that began 6 months ago.  Past Medical History  Diagnosis Date  . Osteomyelitis     Right great toe  . Hypertension   . Arthritis   . Diabetes mellitus     type 2  . Peripheral vascular disease     diabetic  with osteomylitis    Past Surgical History  Procedure Date  . L foot surgery 2006  . Circumcision 2010  . Foot fracture surgery   . Left arm  plate   . Right great toe amputation 07/23/2011  . Amputation 07/23/2011    Procedure: AMPUTATION DIGIT;  Surgeon: Nadara Mustard, MD;  Location: [facility] OR;  Service: Orthopedics;  Laterality: Right;  Right Great Toe Amputation    Family History  Problem Relation Age of Onset  . Anesthesia problems Neg Hx     History  Substance Use Topics  . Smoking status: Former Smoker -- 5 years  . Smokeless tobacco: Never Used  . Alcohol Use: No      Review of Systems A complete 10 system review of systems was obtained and all systems are negative except as noted in the HPI  and PMH.  Allergies  Review of patient's allergies indicates no known allergies.  Home Medications   Current Outpatient Rx  Name  Route  Sig  Dispense  Refill  . VITAMIN B-12 PO   Oral   Take 1 tablet by mouth daily.         Marland Kitchen GLIPIZIDE 5 MG PO TABS   Oral   Take 5 mg by mouth daily.          Marland Kitchen LISINOPRIL 20 MG PO TABS   Oral   Take 20 mg by mouth daily.         Marland Kitchen METFORMIN HCL 1000 MG PO TABS   Oral   Take 1,000 mg by mouth 2 (two) times daily with a meal.          . VITAMIN B-6 100 MG PO TABS   Oral   Take 100 mg by mouth daily.         Marland Kitchen GABAPENTIN 300 MG PO CAPS      Date 2 capsules by mouth 3 times daily   60 capsule   0   . HYDROCODONE-ACETAMINOPHEN 5-325 MG PO TABS   Oral   Take 2 tablets by mouth every 4 (four) hours as needed for pain.   30 tablet  0     BP 138/76  Pulse 96  Temp 96.5 F (35.8 C) (Oral)  Resp 20  SpO2 97%  Physical Exam  Nursing note and vitals reviewed. Constitutional: He is oriented to person, place, and time. He appears well-developed and well-nourished. No distress.  HENT:  Head: Normocephalic and atraumatic.  Eyes: Pupils are equal, round, and reactive to light.  Neck: Normal range of motion.  Cardiovascular: Normal rate and intact distal pulses.   Pulses:      Dorsalis pedis pulses are 1+ on the right side, and 1+ on the left side.       Posterior tibial pulses are 2+ on the right side, and 2+ on the left side.  Pulmonary/Chest: No respiratory distress.  Abdominal: Normal appearance. He exhibits no distension.  Musculoskeletal: Normal range of motion.  Neurological: He is alert and oriented to person, place, and time. No cranial nerve deficit.  Skin: Skin is warm and dry. Rash (Circumferential macular lesions noted on legs) noted.  Psychiatric: He has a normal mood and affect. His behavior is normal.    ED Course  Procedures (including critical care time)  DIAGNOSTIC STUDIES: Oxygen Saturation is 98% on  room air, normal by my interpretation.    COORDINATION OF CARE:  10:43- Discussed planned course of treatment with the patient, including blood work, who is agreeable at this time.  Labs Reviewed  SEDIMENTATION RATE - Abnormal; Notable for the following:    Sed Rate 19 (*)     All other components within normal limits  CBC  RPR   No results found.   1. Chronic pain disorder       MDM  I personally performed the services described in this documentation, which was scribed in my presence. The recorded information has been reviewed and considered.       Nelia Shi, MD 06/15/12 [phone]

## 2012-07-01 ENCOUNTER — Emergency Department (HOSPITAL_COMMUNITY)
Admission: EM | Admit: 2012-07-01 | Discharge: 2012-07-01 | Disposition: A | Discharge status: Home / Self Care | Payer: Medicare HMO | Attending: Emergency Medicine | Admitting: Emergency Medicine

## 2012-07-01 ENCOUNTER — Encounter (HOSPITAL_COMMUNITY): Payer: Self-pay | Admitting: *Deleted

## 2012-07-01 DIAGNOSIS — E119 Type 2 diabetes mellitus without complications: Secondary | ICD-10-CM | POA: Insufficient documentation

## 2012-07-01 DIAGNOSIS — I10 Essential (primary) hypertension: Secondary | ICD-10-CM | POA: Insufficient documentation

## 2012-07-01 DIAGNOSIS — L97509 Non-pressure chronic ulcer of other part of unspecified foot with unspecified severity: Secondary | ICD-10-CM | POA: Insufficient documentation

## 2012-07-01 DIAGNOSIS — M869 Osteomyelitis, unspecified: Secondary | ICD-10-CM | POA: Insufficient documentation

## 2012-07-01 DIAGNOSIS — Z87891 Personal history of nicotine dependence: Secondary | ICD-10-CM | POA: Insufficient documentation

## 2012-07-01 DIAGNOSIS — Z79899 Other long term (current) drug therapy: Secondary | ICD-10-CM | POA: Insufficient documentation

## 2012-07-01 DIAGNOSIS — G8929 Other chronic pain: Secondary | ICD-10-CM | POA: Insufficient documentation

## 2012-07-01 DIAGNOSIS — M129 Arthropathy, unspecified: Secondary | ICD-10-CM | POA: Insufficient documentation

## 2012-07-01 DIAGNOSIS — R209 Unspecified disturbances of skin sensation: Secondary | ICD-10-CM | POA: Insufficient documentation

## 2012-07-01 DIAGNOSIS — M79606 Pain in leg, unspecified: Secondary | ICD-10-CM

## 2012-07-01 MED ORDER — DOXYCYCLINE HYCLATE 100 MG PO CAPS: 100.0000 mg | ORAL_CAPSULE | Freq: Two times a day (BID) | ORAL | Status: DC

## 2012-07-01 MED ORDER — TRAMADOL HCL 50 MG PO TABS: 50.0000 mg | ORAL_TABLET | Freq: Four times a day (QID) | ORAL | Status: DC | PRN

## 2012-07-01 NOTE — ED Provider Notes (Signed)
History     CSN: [id number]  Arrival date & time 07/01/12  4098   First MD Initiated Contact with Patient 07/01/12 1011      Chief Complaint  Patient presents with  . Feet pain     (Consider location/radiation/quality/duration/timing/severity/associated sxs/prior treatment) Patient is a 72 y.o. male presenting with leg pain. The history is provided by the patient. No language interpreter was used.  Leg Pain  The incident occurred more than 1 week ago. The incident occurred at home. There was no injury mechanism. The pain is present in the right leg and left leg. The quality of the pain is described as aching, sharp and throbbing. The pain is at a severity of 10/10. The pain is moderate. The pain has been intermittent since onset. Associated symptoms include numbness and loss of sensation. Pertinent negatives include no inability to bear weight and no muscle weakness. He reports no foreign bodies present. The symptoms are aggravated by activity. The treatment provided mild relief.    Past Medical History  Diagnosis Date  . Osteomyelitis     Right great toe  . Hypertension   . Arthritis   . Diabetes mellitus     type 2  . Peripheral vascular disease     diabetic  with osteomylitis    Past Surgical History  Procedure Date  . L foot surgery 2006  . Circumcision 2010  . Foot fracture surgery   . Left arm  plate   . Right great toe amputation 07/23/2011  . Amputation 07/23/2011    Procedure: AMPUTATION DIGIT;  Surgeon: Nadara Mustard, MD;  Location: [facility] OR;  Service: Orthopedics;  Laterality: Right;  Right Great Toe Amputation    Family History  Problem Relation Age of Onset  . Anesthesia problems Neg Hx     History  Substance Use Topics  . Smoking status: Former Smoker -- 5 years  . Smokeless tobacco: Never Used  . Alcohol Use: No      Review of Systems  Constitutional: Negative for fever.       A complete 10 system review of systems was obtained and all systems are  negative except as noted in the HPI and PMH.  Musculoskeletal: Negative for back pain.  Skin: Positive for wound.  Neurological: Positive for numbness.    Allergies  Review of patient's allergies indicates no known allergies.  Home Medications   Current Outpatient Rx  Name  Route  Sig  Dispense  Refill  . ACETAMINOPHEN 500 MG PO TABS   Oral   Take 1,000 mg by mouth every 6 (six) hours as needed. For pain         . VITAMIN B-12 PO   Oral   Take 1 tablet by mouth daily.         Marland Kitchen GABAPENTIN 300 MG PO CAPS      Date 2 capsules by mouth 3 times daily   60 capsule   0   . GLIPIZIDE 5 MG PO TABS   Oral   Take 5 mg by mouth daily.          Marland Kitchen HYDROCODONE-ACETAMINOPHEN 5-325 MG PO TABS   Oral   Take 2 tablets by mouth every 4 (four) hours as needed for pain.   30 tablet   0   . LISINOPRIL 20 MG PO TABS   Oral   Take 20 mg by mouth daily.         Marland Kitchen METFORMIN HCL 1000 MG PO  TABS   Oral   Take 1,000 mg by mouth 2 (two) times daily with a meal.          . VITAMIN B-6 100 MG PO TABS   Oral   Take 100 mg by mouth daily.           BP 131/80  Pulse 97  Temp 98.7 F (37.1 C) (Oral)  Resp 18  SpO2 100%  Physical Exam  Constitutional: He is oriented to person, place, and time. He appears well-developed and well-nourished. No distress.  HENT:  Head: Atraumatic.  Eyes: Conjunctivae normal are normal.  Neck: Neck supple.  Musculoskeletal: Normal range of motion. He exhibits tenderness (tenderness to sole of both feet.  R foot with ulceration to sole of foot near 1st MTP, ttp.  No exudates.  No abscess.  pedal pulses 2+ bilaterally). He exhibits no edema.  Neurological: He is alert and oriented to person, place, and time.  Skin: Skin is warm.  Psychiatric: He has a normal mood and affect.    ED Course  Procedures (including critical care time)  Labs Reviewed - No data to display No results found.   No diagnosis found.  1. Chronic leg pain 2.  Ulceration, R foot  MDM  Hx of chronic leg pain 2/2 neuropathy, and diabetes.  Pt has an ulceration to sole of R foot, and c/o increasing pain to the affected foot as compare to L foot.  Pt has evidence of skin infection without evidence of joint involvement.  Is vascularly intact.  Pt has been seen in ER for same complaint every 2 weeks.  I recommend pt to f/u with PCP for further management of his chronic illness.  I will also refill his pain medication which provides some relief.  WIll also prescribed abx for his wound.  Return precaution discussed.  Pt able to ambulate without difficulty  BP 131/80  Pulse 97  Temp 98.7 F (37.1 C) (Oral)  Resp 18  SpO2 100%  I have reviewed nursing notes and vital signs.  I reviewed available ER/hospitalization records thought the EMR        Fayrene Helper, [place] 07/01/12 1029

## 2012-07-01 NOTE — ED Provider Notes (Signed)
Medical screening examination/treatment/procedure(s) were performed by non-physician practitioner and as supervising physician I was immediately available for consultation/collaboration.   Tatyana Biber, MD 07/01/12 1552 

## 2012-07-01 NOTE — ED Notes (Signed)
Pt has ulcerative wound on bottom of right foot. States he has had it for a year. Dr. [name] operated on that foot in the past. Open, draining wound.

## 2012-07-01 NOTE — ED Notes (Signed)
Pt is here with complaints of bilateral foot pain.  Pt is diabetic, states only his big toe on right is leaking fluid and pt had the big toe removed.  Blood sugars around 105.

## 2012-07-28 ENCOUNTER — Ambulatory Visit (HOSPITAL_COMMUNITY)
Admission: RE | Admit: 2012-07-28 | Discharge: 2012-07-28 | Disposition: A | Discharge status: Home / Self Care | Payer: Medicare HMO | Source: Ambulatory Visit | Attending: General Surgery | Admitting: General Surgery

## 2012-07-28 ENCOUNTER — Encounter (HOSPITAL_BASED_OUTPATIENT_CLINIC_OR_DEPARTMENT_OTHER): Payer: Medicare HMO | Attending: General Surgery

## 2012-07-28 ENCOUNTER — Other Ambulatory Visit (HOSPITAL_BASED_OUTPATIENT_CLINIC_OR_DEPARTMENT_OTHER): Payer: Self-pay | Admitting: General Surgery

## 2012-07-28 DIAGNOSIS — E1169 Type 2 diabetes mellitus with other specified complication: Secondary | ICD-10-CM | POA: Insufficient documentation

## 2012-07-28 DIAGNOSIS — L97509 Non-pressure chronic ulcer of other part of unspecified foot with unspecified severity: Secondary | ICD-10-CM | POA: Insufficient documentation

## 2012-07-28 DIAGNOSIS — M79673 Pain in unspecified foot: Secondary | ICD-10-CM

## 2012-07-28 DIAGNOSIS — Z79899 Other long term (current) drug therapy: Secondary | ICD-10-CM | POA: Insufficient documentation

## 2012-07-28 DIAGNOSIS — S98139A Complete traumatic amputation of one unspecified lesser toe, initial encounter: Secondary | ICD-10-CM | POA: Insufficient documentation

## 2012-07-28 DIAGNOSIS — I10 Essential (primary) hypertension: Secondary | ICD-10-CM | POA: Insufficient documentation

## 2012-07-28 DIAGNOSIS — M79609 Pain in unspecified limb: Secondary | ICD-10-CM | POA: Insufficient documentation

## 2012-07-28 DIAGNOSIS — I739 Peripheral vascular disease, unspecified: Secondary | ICD-10-CM | POA: Insufficient documentation

## 2012-07-28 NOTE — H&P (Signed)
[name], [name]                ACCOUNT NO.:  [id number]  MEDICAL RECORD NO.:  [id number]  LOCATION:  FOOT                         FACILITY:  [facility]  PHYSICIAN:  Joanne Gavel, M.D.        DATE OF BIRTH:  March 01, 1941  DATE OF ADMISSION:  07/28/2012 DATE OF DISCHARGE:                             HISTORY & PHYSICAL   CHIEF COMPLAINT:  Wound, left foot.  HISTORY OF PRESENT ILLNESS:  This is a 72 year old male with diabetes for 5-6 years.  He has had diabetic foot ulcer before and this is eventuated an amputation of the right great toe.  The present ulcer has been there for 3 months.  There is no pain or tenderness.  There has been some foul odor and discharge.  PAST MEDICAL HISTORY:  Significant for diabetes, peripheral vascular disease, hypertension.  PAST SURGICAL HISTORY:  He has had back surgery in 2009 and right great toe amputation.  SOCIAL HISTORY:  Cigarettes none.  Alcohol occasionally.  ALLERGIES:  None.  MEDICATIONS:  Gabapentin, glipizide, metformin, and lisinopril.  REVIEW OF SYSTEMS:  As above.  PHYSICAL EXAMINATION:  VITAL SIGNS:  Temperature 98.1, pulse 80, respirations 18, blood pressure 132/74. GENERAL APPEARANCE:  Well developed, well nourished, no distress. HEENT:  Eyes, ears, nose, throat normal. CHEST:  Clear. HEART:  Regular rhythm. ABDOMEN:  Not examined. EXTREMITIES: Examination of the left lower extremity reveals excellent peripheral pulses, profound neuropathy.  There is a thick callus surrounding 1.3 x 0.5 x 0.9 deep ulceration on the plantar surface of the left foot.  IMPRESSION:  Diabetic foot ulcer, Wagner 3.  PLAN OF TREATMENT:  This wound was radically debrided.  After debridement of necrotic subcutaneous tissue, the wound probes to bone, but the bone is covered.  We will get x-rays, lab tests.  He has refused an ocean, hyperbaric oxygen, but we will plan if osteomyelitis is not present to offload with easy cast.  We will see him in 7  days.     Joanne Gavel, M.D.     RA/MEDQ  D:  07/28/2012  T:  07/28/2012  Job:  [id number]

## 2012-08-18 ENCOUNTER — Encounter (HOSPITAL_BASED_OUTPATIENT_CLINIC_OR_DEPARTMENT_OTHER): Payer: Medicare HMO | Attending: General Surgery

## 2012-08-18 DIAGNOSIS — L97509 Non-pressure chronic ulcer of other part of unspecified foot with unspecified severity: Secondary | ICD-10-CM | POA: Insufficient documentation

## 2012-08-18 DIAGNOSIS — E1169 Type 2 diabetes mellitus with other specified complication: Secondary | ICD-10-CM | POA: Insufficient documentation

## 2012-08-26 NOTE — Progress Notes (Signed)
[facility]  NAME:  [name], [name]                ACCOUNT NO.:  [id number]  MEDICAL RECORD NO.:  [id number]      DATE OF BIRTH:  01-27-1941  PHYSICIAN:  Joanne Gavel, M.D.         VISIT DATE:  08/25/2012                                  OFFICE VISIT   ADMISSION DIAGNOSIS:  Diabetic foot ulcer, left plantar foot.  DISCHARGE DIAGNOSIS:  Diabetic foot ulcer, left plantar foot.  CLINIC COURSE:  The patient was treated with debridement, placed in easy cast.  After 3 easy casts, the wound was completely healed.  DISCHARGE CONDITION:  Good.  Ambulation, as tolerated with padding to ulcer site.  See [place] p.r.n.     Joanne Gavel, M.D.     RA/MEDQ  D:  08/25/2012  T:  08/26/2012  Job:  [id number]

## 2012-08-31 ENCOUNTER — Emergency Department (HOSPITAL_COMMUNITY)
Admission: EM | Admit: 2012-08-31 | Discharge: 2012-08-31 | Disposition: A | Discharge status: Home / Self Care | Payer: Medicare HMO | Attending: Emergency Medicine | Admitting: Emergency Medicine

## 2012-08-31 ENCOUNTER — Encounter (HOSPITAL_COMMUNITY): Payer: Self-pay | Admitting: Emergency Medicine

## 2012-08-31 DIAGNOSIS — Z8679 Personal history of other diseases of the circulatory system: Secondary | ICD-10-CM | POA: Insufficient documentation

## 2012-08-31 DIAGNOSIS — Z79899 Other long term (current) drug therapy: Secondary | ICD-10-CM | POA: Insufficient documentation

## 2012-08-31 DIAGNOSIS — E1149 Type 2 diabetes mellitus with other diabetic neurological complication: Secondary | ICD-10-CM | POA: Insufficient documentation

## 2012-08-31 DIAGNOSIS — Z87891 Personal history of nicotine dependence: Secondary | ICD-10-CM | POA: Insufficient documentation

## 2012-08-31 DIAGNOSIS — Z8739 Personal history of other diseases of the musculoskeletal system and connective tissue: Secondary | ICD-10-CM | POA: Insufficient documentation

## 2012-08-31 DIAGNOSIS — E1140 Type 2 diabetes mellitus with diabetic neuropathy, unspecified: Secondary | ICD-10-CM

## 2012-08-31 DIAGNOSIS — I10 Essential (primary) hypertension: Secondary | ICD-10-CM | POA: Insufficient documentation

## 2012-08-31 DIAGNOSIS — E1142 Type 2 diabetes mellitus with diabetic polyneuropathy: Secondary | ICD-10-CM | POA: Insufficient documentation

## 2012-08-31 MED ORDER — HYDROCODONE-ACETAMINOPHEN 10-500 MG PO TABS: 1.0000 | ORAL_TABLET | Freq: Four times a day (QID) | ORAL | Status: DC | PRN

## 2012-08-31 NOTE — ED Notes (Signed)
Pt with hx of neuropathy c/o right foot pain. Pt is wearing a wooden shoe. Last saw Dr. [name] 2-3 months ago.

## 2012-08-31 NOTE — ED Provider Notes (Signed)
History     CSN: [id number]  Arrival date & time 08/31/12  1111   First MD Initiated Contact with Patient 08/31/12 1259      Chief Complaint  Patient presents with  . Leg Pain    (Consider location/radiation/quality/duration/timing/severity/associated sxs/prior treatment) HPI Mr. Ingham is a 72 year old male with a history of DM, HTN, and peripheral neuropathy who comes to the ED for right foot and leg pain. The pain has been ongoing for the last year and gotten worse over the last 2-3 months. He states the pain is a burning and tingling sensation, 10/10, and wakes him up at night. He reports the pain is worst by his ankle but occurs in his foot and lower leg up to his knee. He takes gabapentin, which he reports helps decrease the pain. He also uses diabetic shoes, which also help the pain. He denies any injury or trauma to the area. He does report having a sore on the sole of his foot that occurred from scraping his foot on the bath mat about 1 month ago.  Past Medical History  Diagnosis Date  . Osteomyelitis     Right great toe  . Hypertension   . Arthritis   . Diabetes mellitus     type 2  . Peripheral vascular disease     diabetic  with osteomylitis    Past Surgical History  Procedure Laterality Date  . L foot surgery  2006  . Circumcision  2010  . Foot fracture surgery    . Left arm  plate    . Right great toe amputation  07/23/2011  . Amputation  07/23/2011    Procedure: AMPUTATION DIGIT;  Surgeon: Nadara Mustard, MD;  Location: [facility] OR;  Service: Orthopedics;  Laterality: Right;  Right Great Toe Amputation    Family History  Problem Relation Age of Onset  . Anesthesia problems Neg Hx     History  Substance Use Topics  . Smoking status: Former Smoker -- 5 years  . Smokeless tobacco: Never Used  . Alcohol Use: No      Review of Systems All other systems negative except as documented in the HPI. All pertinent positives and negatives as reviewed in the  HPI.  Allergies  Review of patient's allergies indicates no known allergies.  Home Medications   Current Outpatient Rx  Name  Route  Sig  Dispense  Refill  . Cyanocobalamin (VITAMIN B-12 PO)   Oral   Take 1 tablet by mouth daily.         Marland Kitchen gabapentin (NEURONTIN) 300 MG capsule   Oral   Take 600 mg by mouth 3 (three) times daily.         Marland Kitchen glipiZIDE (GLUCOTROL) 5 MG tablet   Oral   Take 5 mg by mouth daily.          . metFORMIN (GLUCOPHAGE) 1000 MG tablet   Oral   Take 1,000 mg by mouth 2 (two) times daily with a meal.          . pyridOXINE (VITAMIN B-6) 100 MG tablet   Oral   Take 100 mg by mouth daily.         . traMADol (ULTRAM) 50 MG tablet   Oral   Take 1 tablet (50 mg total) by mouth every 6 (six) hours as needed for pain.   15 tablet   0     BP 137/78  Pulse 96  Temp(Src) 97.7 F (36.5  C) (Oral)  Resp 18  SpO2 95%  Physical Exam  Constitutional: He appears well-developed and well-nourished.  HENT:  Head: Normocephalic and atraumatic.  Musculoskeletal:       Feet:  Neurological: He has normal strength.  Decreased sensation in the right lower leg to sharp/dull and soft touch.  Skin: Skin is warm and dry.       ED Course  Procedures (including critical care time) This been chronic pain for the patient has been ongoing for years, but worse over the last 3 months.  Patient is referred back to his primary Dr. for adjustment of medications.  He is told to return here as needed.  For any worsening in his condition.  MDM          Carlyle Dolly, PA-C 08/31/12 [phone]

## 2012-08-31 NOTE — ED Notes (Signed)
Pt c/o right leg pain x months; pt denies injury but sts hx of neuropathy

## 2012-08-31 NOTE — ED Provider Notes (Signed)
Medical screening examination/treatment/procedure(s) were performed by non-physician practitioner and as supervising physician I was immediately available for consultation/collaboration.   Glynn Octave, MD 08/31/12 1520

## 2012-09-16 ENCOUNTER — Other Ambulatory Visit (HOSPITAL_COMMUNITY): Payer: Self-pay | Admitting: Orthopedic Surgery

## 2012-09-16 ENCOUNTER — Encounter (HOSPITAL_COMMUNITY): Payer: Self-pay

## 2012-09-16 ENCOUNTER — Ambulatory Visit (HOSPITAL_COMMUNITY)
Admission: RE | Admit: 2012-09-16 | Discharge: 2012-09-16 | Disposition: A | Discharge status: Home / Self Care | Payer: Medicare HMO | Source: Ambulatory Visit | Attending: Anesthesiology | Admitting: Anesthesiology

## 2012-09-16 ENCOUNTER — Encounter (HOSPITAL_COMMUNITY): Payer: Self-pay | Admitting: Pharmacy Technician

## 2012-09-16 ENCOUNTER — Encounter (HOSPITAL_COMMUNITY)
Admission: RE | Admit: 2012-09-16 | Discharge: 2012-09-16 | Disposition: A | Discharge status: Home / Self Care | Payer: Medicare HMO | Source: Ambulatory Visit | Attending: Orthopedic Surgery | Admitting: Orthopedic Surgery

## 2012-09-16 DIAGNOSIS — Z01810 Encounter for preprocedural cardiovascular examination: Secondary | ICD-10-CM | POA: Insufficient documentation

## 2012-09-16 DIAGNOSIS — E119 Type 2 diabetes mellitus without complications: Secondary | ICD-10-CM | POA: Insufficient documentation

## 2012-09-16 DIAGNOSIS — Z01818 Encounter for other preprocedural examination: Secondary | ICD-10-CM | POA: Insufficient documentation

## 2012-09-16 DIAGNOSIS — Z01812 Encounter for preprocedural laboratory examination: Secondary | ICD-10-CM | POA: Insufficient documentation

## 2012-09-16 DIAGNOSIS — I10 Essential (primary) hypertension: Secondary | ICD-10-CM | POA: Insufficient documentation

## 2012-09-16 LAB — BASIC METABOLIC PANEL
BUN: 5 mg/dL — ABNORMAL LOW (ref 6–23)
CO2: 29 mEq/L (ref 19–32)
Calcium: 9.4 mg/dL (ref 8.4–10.5)
Chloride: 101 mEq/L (ref 96–112)
Creatinine, Ser: 0.71 mg/dL (ref 0.50–1.35)
GFR calc Af Amer: >90 mL/min (ref >90)
GFR calc non Af Amer: >90 mL/min (ref >90)
Glucose, Bld: 201 mg/dL — ABNORMAL HIGH (ref 70–99)
Potassium: 3.9 mEq/L (ref 3.5–5.1)
Sodium: 138 mEq/L (ref 135–145)

## 2012-09-16 LAB — CBC
HCT: 40.3 % (ref 39.0–52.0)
Hemoglobin: 13.5 g/dL (ref 13.0–17.0)
MCH: 28.2 pg (ref 26.0–34.0)
MCHC: 33.5 g/dL (ref 30.0–36.0)
MCV: 84.1 fL (ref 78.0–100.0)
Platelets: 349 10*3/uL (ref 150–400)
RBC: 4.79 MIL/uL (ref 4.22–5.81)
RDW: 13.6 % (ref 11.5–15.5)
WBC: 7.7 10*3/uL (ref 4.0–10.5)

## 2012-09-16 LAB — SURGICAL PCR SCREEN
MRSA, PCR: NEGATIVE
Staphylococcus aureus: NEGATIVE

## 2012-09-16 NOTE — Progress Notes (Signed)
Office called for orders 

## 2012-09-16 NOTE — Pre-Procedure Instructions (Addendum)
Jud Fanguy  09/16/2012   Your procedure is scheduled on:  Friday, April 4th  Report to [facility] Short Stay Center at Call at 800 AM morning of surgery to find out your arrival time.  Call this number if you have problems the morning of surgery: [phone]   Remember:   Do not eat food or drink liquids after midnight.    Take these medicines the morning of surgery with A SIP OF WATER: neurontin, lortab if needed   Do not wear jewelry, make-up or nail polish.  Do not wear lotions, powders, or perfumes. You may wear deodorant.  Do not shave 48 hours prior to surgery. Men may shave face and neck.  Do not bring valuables to the hospital.  Contacts, dentures or bridgework may not be worn into surgery.  Leave suitcase in the car. After surgery it may be brought to your room.  For patients admitted to the hospital, checkout time is 11:00 AM the day of discharge.   Patients discharged the day of surgery will not be allowed to drive home.    Special Instructions: Shower using CHG 2 nights before surgery and the night before surgery.  If you shower the day of surgery use CHG.  Use special wash - you have one bottle of CHG for all showers.  You should use approximately 1/3 of the bottle for each shower.   Please read over the following fact sheets that you were given: Pain Booklet, Coughing and Deep Breathing, MRSA Information and Surgical Site Infection Prevention

## 2012-09-17 MED ORDER — CEFAZOLIN SODIUM-DEXTROSE 2-3 GM-% IV SOLR
2.0000 g | INTRAVENOUS | Status: AC
  Administered 2012-09-18: 2 g via INTRAVENOUS

## 2012-09-17 NOTE — Progress Notes (Signed)
Anesthesia chart review: Patient is a 72 year old male scheduled for right foot second ray amputation by Dr. [name] on 09/18/2012. History includes former smoker, diabetes mellitus type 2, osteomyelitis, hypertension, arthritis, peripheral vascular disease.  He is status post right great toe ray amputation 07/2011.  EKG on 09/16/12 showed NSR, possible LAE.  He also appears to have an incomplete right BBB pattern in septal leads (not mentioned in the final interpretation.)    Preoperative CXR and labs noted.  CBG on arrival.  He will be evaluated by his assigned anesthesiologist on the day of surgery to discuss the definitive anesthesia plan.  Velna Ochs [facility] Short Stay Center/Anesthesiology Phone [phone] 09/17/2012 10:43 AM

## 2012-09-18 ENCOUNTER — Encounter (HOSPITAL_COMMUNITY): Payer: Self-pay | Admitting: *Deleted

## 2012-09-18 ENCOUNTER — Ambulatory Visit (HOSPITAL_COMMUNITY): Payer: Medicare HMO | Admitting: Certified Registered Nurse Anesthetist

## 2012-09-18 ENCOUNTER — Ambulatory Visit (HOSPITAL_COMMUNITY)
Admission: RE | Admit: 2012-09-18 | Discharge: 2012-09-18 | Disposition: A | Discharge status: Home / Self Care | Payer: Medicare HMO | Source: Ambulatory Visit | Attending: Orthopedic Surgery | Admitting: Orthopedic Surgery

## 2012-09-18 ENCOUNTER — Encounter (HOSPITAL_COMMUNITY): Payer: Self-pay | Admitting: Vascular Surgery

## 2012-09-18 ENCOUNTER — Encounter (HOSPITAL_COMMUNITY)
Admission: RE | Disposition: A | Discharge status: Home / Self Care | Payer: Self-pay | Source: Ambulatory Visit | Attending: Orthopedic Surgery

## 2012-09-18 DIAGNOSIS — Z87891 Personal history of nicotine dependence: Secondary | ICD-10-CM | POA: Insufficient documentation

## 2012-09-18 DIAGNOSIS — L97509 Non-pressure chronic ulcer of other part of unspecified foot with unspecified severity: Secondary | ICD-10-CM | POA: Insufficient documentation

## 2012-09-18 DIAGNOSIS — E1169 Type 2 diabetes mellitus with other specified complication: Secondary | ICD-10-CM | POA: Insufficient documentation

## 2012-09-18 DIAGNOSIS — M9080 Osteopathy in diseases classified elsewhere, unspecified site: Secondary | ICD-10-CM | POA: Insufficient documentation

## 2012-09-18 DIAGNOSIS — E1149 Type 2 diabetes mellitus with other diabetic neurological complication: Secondary | ICD-10-CM | POA: Insufficient documentation

## 2012-09-18 DIAGNOSIS — I10 Essential (primary) hypertension: Secondary | ICD-10-CM | POA: Insufficient documentation

## 2012-09-18 DIAGNOSIS — I739 Peripheral vascular disease, unspecified: Secondary | ICD-10-CM | POA: Insufficient documentation

## 2012-09-18 DIAGNOSIS — E1142 Type 2 diabetes mellitus with diabetic polyneuropathy: Secondary | ICD-10-CM | POA: Insufficient documentation

## 2012-09-18 DIAGNOSIS — M86171 Other acute osteomyelitis, right ankle and foot: Secondary | ICD-10-CM

## 2012-09-18 DIAGNOSIS — M869 Osteomyelitis, unspecified: Secondary | ICD-10-CM | POA: Insufficient documentation

## 2012-09-18 HISTORY — PX: AMPUTATION: SHX166

## 2012-09-18 LAB — HEPATIC FUNCTION PANEL
ALT: 7 U/L (ref 0–53)
AST: 8 U/L (ref 0–37)
Albumin: 3.5 g/dL (ref 3.5–5.2)
Alkaline Phosphatase: 72 U/L (ref 39–117)
Bilirubin, Direct: <0.1 mg/dL (ref 0.0–0.3)
Total Bilirubin: 0.4 mg/dL (ref 0.3–1.2)
Total Protein: 8.6 g/dL — ABNORMAL HIGH (ref 6.0–8.3)

## 2012-09-18 LAB — APTT: aPTT: 36 seconds (ref 24–37)

## 2012-09-18 LAB — GLUCOSE, CAPILLARY
Glucose-Capillary: 172 mg/dL — ABNORMAL HIGH (ref 70–99)
Glucose-Capillary: 164 mg/dL — ABNORMAL HIGH (ref 70–99)
Glucose-Capillary: 166 mg/dL — ABNORMAL HIGH (ref 70–99)
Glucose-Capillary: 154 mg/dL — ABNORMAL HIGH (ref 70–99)

## 2012-09-18 LAB — PROTIME-INR
INR: 1.13 (ref 0.00–1.49)
Prothrombin Time: 14.3 seconds (ref 11.6–15.2)

## 2012-09-18 SURGERY — AMPUTATION, FOOT, RAY
Anesthesia: General | Site: Foot | Laterality: Right | Wound class: Dirty or Infected

## 2012-09-18 MED ORDER — HYDROMORPHONE HCL PF 1 MG/ML IJ SOLN: 0.2500 mg | INTRAMUSCULAR | Status: DC | PRN

## 2012-09-18 MED ORDER — CEFAZOLIN SODIUM 1-5 GM-% IV SOLN
INTRAVENOUS | Status: AC
  Filled 2012-09-18: qty 100

## 2012-09-18 MED ORDER — FENTANYL CITRATE 0.05 MG/ML IJ SOLN
INTRAMUSCULAR | Status: DC | PRN
  Administered 2012-09-18: 50 ug via INTRAVENOUS

## 2012-09-18 MED ORDER — LIDOCAINE HCL (CARDIAC) 20 MG/ML IV SOLN
INTRAVENOUS | Status: DC | PRN
  Administered 2012-09-18: 75 mg via INTRAVENOUS

## 2012-09-18 MED ORDER — LACTATED RINGERS IV SOLN
INTRAVENOUS | Status: DC
  Administered 2012-09-18 (×2): via INTRAVENOUS

## 2012-09-18 MED ORDER — ONDANSETRON HCL 4 MG/2ML IJ SOLN: 4.0000 mg | Freq: Once | INTRAMUSCULAR | Status: DC | PRN

## 2012-09-18 MED ORDER — ACETAMINOPHEN 10 MG/ML IV SOLN: 1000.0000 mg | Freq: Once | INTRAVENOUS | Status: DC | PRN

## 2012-09-18 MED ORDER — HYDROCODONE-ACETAMINOPHEN 10-325 MG PO TABS: 1.0000 | ORAL_TABLET | Freq: Four times a day (QID) | ORAL | Status: DC | PRN

## 2012-09-18 MED ORDER — PROPOFOL 10 MG/ML IV BOLUS
INTRAVENOUS | Status: DC | PRN
  Administered 2012-09-18: 200 mg via INTRAVENOUS

## 2012-09-18 MED ORDER — 0.9 % SODIUM CHLORIDE (POUR BTL) OPTIME
TOPICAL | Status: DC | PRN
  Administered 2012-09-18: 1000 mL

## 2012-09-18 SURGICAL SUPPLY — 41 items
BANDAGE ESMARK 6X9 LF (GAUZE/BANDAGES/DRESSINGS) IMPLANT
BANDAGE GAUZE ELAST BULKY 4 IN (GAUZE/BANDAGES/DRESSINGS) ×2 IMPLANT
BLADE SAW SGTL MED 73X18.5 STR (BLADE) IMPLANT
BNDG CMPR 9X6 STRL LF SNTH (GAUZE/BANDAGES/DRESSINGS)
BNDG COHESIVE 4X5 TAN STRL (GAUZE/BANDAGES/DRESSINGS) ×2 IMPLANT
BNDG COHESIVE 6X5 TAN STRL LF (GAUZE/BANDAGES/DRESSINGS) ×2 IMPLANT
BNDG ESMARK 6X9 LF (GAUZE/BANDAGES/DRESSINGS)
CLOTH BEACON ORANGE TIMEOUT ST (SAFETY) ×2 IMPLANT
CUFF TOURNIQUET SINGLE 34IN LL (TOURNIQUET CUFF) IMPLANT
CUFF TOURNIQUET SINGLE 44IN (TOURNIQUET CUFF) IMPLANT
DRAPE U-SHAPE 47X51 STRL (DRAPES) ×4 IMPLANT
DRSG ADAPTIC 3X8 NADH LF (GAUZE/BANDAGES/DRESSINGS) ×2 IMPLANT
DRSG PAD ABDOMINAL 8X10 ST (GAUZE/BANDAGES/DRESSINGS) ×1 IMPLANT
DURAPREP 26ML APPLICATOR (WOUND CARE) ×2 IMPLANT
ELECT REM PT RETURN 9FT ADLT (ELECTROSURGICAL) ×2
ELECTRODE REM PT RTRN 9FT ADLT (ELECTROSURGICAL) ×1 IMPLANT
GLOVE BIOGEL PI IND STRL 9 (GLOVE) ×1 IMPLANT
GLOVE BIOGEL PI INDICATOR 9 (GLOVE) ×1
GLOVE SURG ORTHO 9.0 STRL STRW (GLOVE) ×2 IMPLANT
GOWN PREVENTION PLUS XLARGE (GOWN DISPOSABLE) ×2 IMPLANT
GOWN SRG XL XLNG 56XLVL 4 (GOWN DISPOSABLE) ×1 IMPLANT
GOWN STRL NON-REIN XL XLG LVL4 (GOWN DISPOSABLE) ×2
KIT BASIN OR (CUSTOM PROCEDURE TRAY) ×2 IMPLANT
KIT ROOM TURNOVER OR (KITS) ×2 IMPLANT
MANIFOLD NEPTUNE II (INSTRUMENTS) ×2 IMPLANT
NS IRRIG 1000ML POUR BTL (IV SOLUTION) ×2 IMPLANT
PACK ORTHO EXTREMITY (CUSTOM PROCEDURE TRAY) ×2 IMPLANT
PAD ARMBOARD 7.5X6 YLW CONV (MISCELLANEOUS) ×4 IMPLANT
PAD CAST 4YDX4 CTTN HI CHSV (CAST SUPPLIES) ×1 IMPLANT
PADDING CAST COTTON 4X4 STRL (CAST SUPPLIES) ×2
SPONGE GAUZE 4X4 12PLY (GAUZE/BANDAGES/DRESSINGS) ×2 IMPLANT
SPONGE LAP 18X18 X RAY DECT (DISPOSABLE) ×2 IMPLANT
STAPLER VISISTAT 35W (STAPLE) ×2 IMPLANT
STOCKINETTE IMPERVIOUS LG (DRAPES) IMPLANT
SUCTION FRAZIER TIP 10 FR DISP (SUCTIONS) ×2 IMPLANT
SUT ETHILON 2 0 PSLX (SUTURE) ×4 IMPLANT
TOWEL OR 17X24 6PK STRL BLUE (TOWEL DISPOSABLE) ×2 IMPLANT
TOWEL OR 17X26 10 PK STRL BLUE (TOWEL DISPOSABLE) ×2 IMPLANT
TUBE CONNECTING 12X1/4 (SUCTIONS) ×2 IMPLANT
UNDERPAD 30X30 INCONTINENT (UNDERPADS AND DIAPERS) ×2 IMPLANT
WATER STERILE IRR 1000ML POUR (IV SOLUTION) ×2 IMPLANT

## 2012-09-18 NOTE — Transfer of Care (Signed)
Immediate Anesthesia Transfer of Care Note  Patient: [name]  Procedure(s) Performed: Procedure(s) with comments: Right Foot 2nd Ray Amputation (Right) - Right Foot 2nd Ray Amputation  Patient Location: PACU  Anesthesia Type:General  Level of Consciousness: awake and alert   Airway & Oxygen Therapy: Patient Spontanous Breathing and Patient connected to nasal cannula oxygen  Post-op Assessment: Report given to PACU RN and Post -op Vital signs reviewed and stable  Post vital signs: Reviewed and stable  Complications: No apparent anesthesia complications

## 2012-09-18 NOTE — Anesthesia Procedure Notes (Signed)
Procedure Name: LMA Insertion Date/Time: 09/18/2012 5:49 PM Performed by: Gwenyth Allegra Pre-anesthesia Checklist: Patient identified, Timeout performed, Emergency Drugs available, Suction available and Patient being monitored Patient Re-evaluated:Patient Re-evaluated prior to inductionOxygen Delivery Method: Circle system utilized Preoxygenation: Pre-oxygenation with 100% oxygen Intubation Type: IV induction Ventilation: Mask ventilation without difficulty LMA: LMA inserted LMA Size: 5.0 Number of attempts: 2 Placement Confirmation: breath sounds checked- equal and bilateral and positive ETCO2 Tube secured with: Tape Dental Injury: Teeth and Oropharynx as per pre-operative assessment

## 2012-09-18 NOTE — Preoperative (Signed)
Beta Blockers   Reason not to administer Beta Blockers:Not Applicable 

## 2012-09-18 NOTE — Op Note (Signed)
OPERATIVE REPORT  DATE OF SURGERY: 09/18/2012  PATIENT:  [name],  72 y.o. 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 He has failed conservative wound care has osteomyelitis of second metatarsal presents at this time for second ray amputation. Risks and benefits were discussed including persistent infection nonhealing of the wound potential for higher level amputation. Patient states he understands and wished to proceed at this time. Description of procedure patient brought to the operating room and underwent a general anesthetic. After adequate levels of anesthesia were obtained patient's right lower extremity was prepped using DuraPrep and draped into a sterile field. The ulcer is approximately 3 cm in diameter. A racquet incision was made to include the metatarsal and the ulcer this was all resected in one block of tissue. The metatarsal was resected through its mid shaft. The wound is irrigated with normal saline hemostasis was obtained. The incision was closed using 2-0 nylon. The wound was covered Adaptic orthopedic sponges AB dressing Kerlix and Coban. Patient was extubated taken the PACU in stable condition plan for discharge to home.  PLAN OF CARE: Discharge to home after PACU  PATIENT DISPOSITION:  PACU - hemodynamically stable.   Nadara Mustard, MD 09/18/2012 6:14 PM

## 2012-09-18 NOTE — H&P (Signed)
[name] is an 72 y.o. male.   Chief Complaint: Osteomyelitis abscess right foot second toe HPI: Patient is a 72 year old gentleman with diabetic insensate neuropathy who has had progressive swelling infection deformity osteomyelitis right foot second toe despite conservative wound care.  Past Medical History  Diagnosis Date  . Osteomyelitis     Right great toe  . Hypertension   . Arthritis   . Diabetes mellitus     type 2  . Peripheral vascular disease     diabetic  with osteomylitis    Past Surgical History  Procedure Laterality Date  . L foot surgery  2006  . Circumcision  2010  . Foot fracture surgery Left   . Left arm  plate Right   . Right great toe amputation  07/23/2011  . Amputation  07/23/2011    Procedure: AMPUTATION DIGIT;  Surgeon: Nadara Mustard, MD;  Location: [facility] OR;  Service: Orthopedics;  Laterality: Right;  Right Great Toe Amputation    Family History  Problem Relation Age of Onset  . Anesthesia problems Neg Hx    Social History:  reports that he quit smoking about 49 years ago. His smoking use included Pipe. He has never used smokeless tobacco. He reports that he does not drink alcohol or use illicit drugs.  Allergies: No Known Allergies  No prescriptions prior to admission    Results for orders placed during the hospital encounter of 09/16/12 (from the past 48 hour(s))  SURGICAL PCR SCREEN     Status: None   Collection Time    09/16/12  1:18 PM      Result Value Range   MRSA, PCR NEGATIVE  NEGATIVE   Staphylococcus aureus NEGATIVE  NEGATIVE   Comment:            The Xpert SA Assay (FDA     approved for NASAL specimens     in patients over 42 years of age),     is one component of     a comprehensive surveillance     program.  Test performance has     been validated by [employer] for patients greater     than or equal to 8 year old.     It is not intended     to diagnose infection nor to     guide or monitor treatment.  BASIC METABOLIC  PANEL     Status: Abnormal   Collection Time    09/16/12  1:19 PM      Result Value Range   Sodium 138  135 - 145 mEq/L   Potassium 3.9  3.5 - 5.1 mEq/L   Chloride 101  96 - 112 mEq/L   CO2 29  19 - 32 mEq/L   Glucose, Bld 201 (*) 70 - 99 mg/dL   BUN 5 (*) 6 - 23 mg/dL   Creatinine, Ser 0.98  0.50 - 1.35 mg/dL   Calcium 9.4  8.4 - 11.9 mg/dL   GFR calc non Af Amer >90  >90 mL/min   GFR calc Af Amer >90  >90 mL/min   Comment:            The eGFR has been calculated     using the CKD EPI equation.     This calculation has not been     validated in all clinical     situations.     eGFR's persistently     <90 mL/min signify  possible Chronic Kidney Disease.  CBC     Status: None   Collection Time    09/16/12  1:19 PM      Result Value Range   WBC 7.7  4.0 - 10.5 K/uL   RBC 4.79  4.22 - 5.81 MIL/uL   Hemoglobin 13.5  13.0 - 17.0 g/dL   HCT 95.2  84.1 - 32.4 %   MCV 84.1  78.0 - 100.0 fL   MCH 28.2  26.0 - 34.0 pg   MCHC 33.5  30.0 - 36.0 g/dL   RDW 40.1  02.7 - 25.3 %   Platelets 349  150 - 400 K/uL   Dg Chest 2 View  09/16/2012  *RADIOLOGY REPORT*  Clinical Data: Preadmit foot surgery  CHEST - 2 VIEW  Comparison: 07/23/2011  Findings: Heart size upper normal.  Negative for heart failure. Lungs are clear without infiltrate or mass lesion.  No pleural effusion.  No change from prior study.  IMPRESSION: No acute cardiopulmonary abnormality.   Original Report Authenticated By: Janeece Riggers, M.D.     Review of Systems  All other systems reviewed and are negative.    There were no vitals taken for this visit. Physical Exam  On examination patient has a palpable dorsalis pedis pulse there is sausage digit swelling abscess osteomyelitis ulceration right foot second toe Assessment/Plan Assessment: Right foot second toe abscess ulceration and osteomyelitis.  Plan. Will plan for a right foot second ray amputation. Risks and benefits were discussed including persistent infection  nonhealing of the wound need for higher level amputation. Patient states he understands and wished to proceed at this time.  DUDA,MARCUS V 09/18/2012, 6:40 AM

## 2012-09-18 NOTE — Anesthesia Preprocedure Evaluation (Addendum)
Anesthesia Evaluation  Patient identified by MRN, date of birth, ID band Patient awake    Reviewed: Allergy & Precautions, H&P , NPO status   Airway Mallampati: II      Dental  (+) Edentulous Upper, Partial Lower and Dental Advisory Given   Pulmonary neg pulmonary ROS,  breath sounds clear to auscultation        Cardiovascular hypertension, Pt. on medications + Peripheral Vascular Disease Rhythm:Regular Rate:Normal     Neuro/Psych    GI/Hepatic negative GI ROS, Neg liver ROS,   Endo/Other  diabetes, Type 2  Renal/GU negative Renal ROS     Musculoskeletal   Abdominal   Peds  Hematology   Anesthesia Other Findings   Reproductive/Obstetrics                          Anesthesia Physical Anesthesia Plan  ASA: III  Anesthesia Plan: General   Post-op Pain Management:    Induction: Intravenous  Airway Management Planned: Oral ETT and LMA  Additional Equipment:   Intra-op Plan:   Post-operative Plan: Extubation in OR  Informed Consent: I have reviewed the patients History and Physical, chart, labs and discussed the procedure including the risks, benefits and alternatives for the proposed anesthesia with the patient or authorized representative who has indicated his/her understanding and acceptance.   Dental advisory given  Plan Discussed with: CRNA, Anesthesiologist and Surgeon  Anesthesia Plan Comments: (Type 2 DM glucose 164 Htn Nonhealing ulcer R. Foot  Plan GA with LMA  Kipp Brood, MD)       Anesthesia Quick Evaluation

## 2012-09-18 NOTE — Anesthesia Postprocedure Evaluation (Signed)
  Anesthesia Post-op Note  Patient: [name]  Procedure(s) Performed: Procedure(s) with comments: Right Foot 2nd Ray Amputation (Right) - Right Foot 2nd Ray Amputation  Patient Location: PACU  Anesthesia Type:General  Level of Consciousness: awake  Airway and Oxygen Therapy: Patient Spontanous Breathing  Post-op Pain: mild  Post-op Assessment: Post-op Vital signs reviewed  Post-op Vital Signs: Reviewed  Complications: No apparent anesthesia complications

## 2012-09-21 ENCOUNTER — Encounter (HOSPITAL_COMMUNITY): Payer: Self-pay | Admitting: Orthopedic Surgery

## 2012-10-11 ENCOUNTER — Emergency Department (HOSPITAL_COMMUNITY): Payer: Medicare HMO

## 2012-10-11 ENCOUNTER — Encounter (HOSPITAL_COMMUNITY): Payer: Self-pay | Admitting: *Deleted

## 2012-10-11 DIAGNOSIS — E7800 Pure hypercholesterolemia, unspecified: Secondary | ICD-10-CM | POA: Diagnosis present

## 2012-10-11 DIAGNOSIS — E1169 Type 2 diabetes mellitus with other specified complication: Secondary | ICD-10-CM | POA: Diagnosis present

## 2012-10-11 DIAGNOSIS — M9080 Osteopathy in diseases classified elsewhere, unspecified site: Secondary | ICD-10-CM | POA: Diagnosis present

## 2012-10-11 DIAGNOSIS — G589 Mononeuropathy, unspecified: Secondary | ICD-10-CM | POA: Diagnosis present

## 2012-10-11 DIAGNOSIS — M129 Arthropathy, unspecified: Secondary | ICD-10-CM | POA: Diagnosis present

## 2012-10-11 DIAGNOSIS — M869 Osteomyelitis, unspecified: Secondary | ICD-10-CM | POA: Diagnosis present

## 2012-10-11 DIAGNOSIS — E1159 Type 2 diabetes mellitus with other circulatory complications: Secondary | ICD-10-CM | POA: Diagnosis present

## 2012-10-11 DIAGNOSIS — D649 Anemia, unspecified: Secondary | ICD-10-CM | POA: Diagnosis present

## 2012-10-11 DIAGNOSIS — S98139A Complete traumatic amputation of one unspecified lesser toe, initial encounter: Secondary | ICD-10-CM

## 2012-10-11 DIAGNOSIS — F17200 Nicotine dependence, unspecified, uncomplicated: Secondary | ICD-10-CM | POA: Diagnosis present

## 2012-10-11 DIAGNOSIS — K219 Gastro-esophageal reflux disease without esophagitis: Secondary | ICD-10-CM | POA: Diagnosis present

## 2012-10-11 DIAGNOSIS — I798 Other disorders of arteries, arterioles and capillaries in diseases classified elsewhere: Secondary | ICD-10-CM | POA: Diagnosis present

## 2012-10-11 DIAGNOSIS — Z9119 Patient's noncompliance with other medical treatment and regimen: Secondary | ICD-10-CM

## 2012-10-11 DIAGNOSIS — Z794 Long term (current) use of insulin: Secondary | ICD-10-CM

## 2012-10-11 DIAGNOSIS — L02619 Cutaneous abscess of unspecified foot: Principal | ICD-10-CM | POA: Diagnosis present

## 2012-10-11 DIAGNOSIS — L03119 Cellulitis of unspecified part of limb: Principal | ICD-10-CM | POA: Diagnosis present

## 2012-10-11 DIAGNOSIS — Z91199 Patient's noncompliance with other medical treatment and regimen due to unspecified reason: Secondary | ICD-10-CM

## 2012-10-11 NOTE — ED Notes (Signed)
~   1hr ago. Upper chest pain. Head hurts. No sob, diaphoresis, or n/v. Rates pain 8/10

## 2012-10-11 NOTE — ED Notes (Signed)
Refuses blood work at this time in triage

## 2012-10-12 ENCOUNTER — Emergency Department (HOSPITAL_COMMUNITY): Payer: Medicare HMO

## 2012-10-12 ENCOUNTER — Encounter (HOSPITAL_COMMUNITY): Payer: Self-pay | Admitting: Internal Medicine

## 2012-10-12 ENCOUNTER — Inpatient Hospital Stay (HOSPITAL_COMMUNITY)
Admission: EM | Admit: 2012-10-12 | Discharge: 2012-10-15 | DRG: 580 | Disposition: A | Discharge status: Home / Self Care | Payer: Medicare HMO | Attending: Family Medicine | Admitting: Family Medicine

## 2012-10-12 ENCOUNTER — Inpatient Hospital Stay (HOSPITAL_COMMUNITY): Payer: Medicare HMO

## 2012-10-12 DIAGNOSIS — L089 Local infection of the skin and subcutaneous tissue, unspecified: Secondary | ICD-10-CM

## 2012-10-12 DIAGNOSIS — E119 Type 2 diabetes mellitus without complications: Secondary | ICD-10-CM

## 2012-10-12 DIAGNOSIS — R079 Chest pain, unspecified: Secondary | ICD-10-CM | POA: Diagnosis present

## 2012-10-12 DIAGNOSIS — L0291 Cutaneous abscess, unspecified: Secondary | ICD-10-CM

## 2012-10-12 DIAGNOSIS — E1169 Type 2 diabetes mellitus with other specified complication: Secondary | ICD-10-CM

## 2012-10-12 DIAGNOSIS — L97509 Non-pressure chronic ulcer of other part of unspecified foot with unspecified severity: Secondary | ICD-10-CM

## 2012-10-12 DIAGNOSIS — L0390 Cellulitis, unspecified: Secondary | ICD-10-CM | POA: Diagnosis present

## 2012-10-12 DIAGNOSIS — Z720 Tobacco use: Secondary | ICD-10-CM | POA: Diagnosis present

## 2012-10-12 DIAGNOSIS — I70209 Unspecified atherosclerosis of native arteries of extremities, unspecified extremity: Secondary | ICD-10-CM | POA: Diagnosis present

## 2012-10-12 DIAGNOSIS — E1151 Type 2 diabetes mellitus with diabetic peripheral angiopathy without gangrene: Secondary | ICD-10-CM | POA: Diagnosis present

## 2012-10-12 DIAGNOSIS — F17200 Nicotine dependence, unspecified, uncomplicated: Secondary | ICD-10-CM

## 2012-10-12 DIAGNOSIS — K219 Gastro-esophageal reflux disease without esophagitis: Secondary | ICD-10-CM

## 2012-10-12 DIAGNOSIS — E11628 Type 2 diabetes mellitus with other skin complications: Secondary | ICD-10-CM

## 2012-10-12 DIAGNOSIS — E11621 Type 2 diabetes mellitus with foot ulcer: Secondary | ICD-10-CM

## 2012-10-12 DIAGNOSIS — M869 Osteomyelitis, unspecified: Secondary | ICD-10-CM

## 2012-10-12 HISTORY — DX: Type 2 diabetes mellitus without complications: E11.9

## 2012-10-12 HISTORY — DX: Pure hypercholesterolemia, unspecified: E78.00

## 2012-10-12 LAB — GLUCOSE, CAPILLARY
Glucose-Capillary: 197 mg/dL — ABNORMAL HIGH (ref 70–99)
Glucose-Capillary: 169 mg/dL — ABNORMAL HIGH (ref 70–99)
Glucose-Capillary: 216 mg/dL — ABNORMAL HIGH (ref 70–99)
Glucose-Capillary: 156 mg/dL — ABNORMAL HIGH (ref 70–99)

## 2012-10-12 LAB — BASIC METABOLIC PANEL
BUN: 8 mg/dL (ref 6–23)
CO2: 25 mEq/L (ref 19–32)
Calcium: 9.2 mg/dL (ref 8.4–10.5)
Chloride: 94 mEq/L — ABNORMAL LOW (ref 96–112)
Creatinine, Ser: 0.89 mg/dL (ref 0.50–1.35)
GFR calc Af Amer: >90 mL/min (ref >90)
GFR calc non Af Amer: 85 mL/min — ABNORMAL LOW (ref >90)
Glucose, Bld: 201 mg/dL — ABNORMAL HIGH (ref 70–99)
Potassium: 3.4 mEq/L — ABNORMAL LOW (ref 3.5–5.1)
Sodium: 132 mEq/L — ABNORMAL LOW (ref 135–145)

## 2012-10-12 LAB — TROPONIN I
Troponin I: <0.3 ng/mL (ref <0.30)
Troponin I: <0.3 ng/mL (ref <0.30)
Troponin I: <0.3 ng/mL (ref <0.30)

## 2012-10-12 LAB — POCT I-STAT TROPONIN I: Troponin i, poc: 0.02 ng/mL (ref 0.00–0.08)

## 2012-10-12 LAB — HEMOGLOBIN A1C
Hgb A1c MFr Bld: 11.3 % — ABNORMAL HIGH (ref <5.7)
Mean Plasma Glucose: 278 mg/dL — ABNORMAL HIGH (ref <117)

## 2012-10-12 LAB — CBC
HCT: 36.3 % — ABNORMAL LOW (ref 39.0–52.0)
Hemoglobin: 12.4 g/dL — ABNORMAL LOW (ref 13.0–17.0)
MCH: 27.3 pg (ref 26.0–34.0)
MCHC: 34.2 g/dL (ref 30.0–36.0)
MCV: 79.8 fL (ref 78.0–100.0)
Platelets: 327 10*3/uL (ref 150–400)
RBC: 4.55 MIL/uL (ref 4.22–5.81)
RDW: 14.1 % (ref 11.5–15.5)
WBC: 8.3 10*3/uL (ref 4.0–10.5)

## 2012-10-12 LAB — SEDIMENTATION RATE: Sed Rate: 88 mm/hr — ABNORMAL HIGH (ref 0–16)

## 2012-10-12 LAB — D-DIMER, QUANTITATIVE: D-Dimer, Quant: 1.23 ug/mL-FEU — ABNORMAL HIGH (ref 0.00–0.48)

## 2012-10-12 MED ORDER — MORPHINE SULFATE 2 MG/ML IJ SOLN: 1.0000 mg | INTRAMUSCULAR | Status: DC | PRN

## 2012-10-12 MED ORDER — ONDANSETRON HCL 4 MG PO TABS: 4.0000 mg | ORAL_TABLET | Freq: Four times a day (QID) | ORAL | Status: DC | PRN

## 2012-10-12 MED ORDER — PANTOPRAZOLE SODIUM 40 MG PO TBEC
40.0000 mg | DELAYED_RELEASE_TABLET | Freq: Every day | ORAL | Status: DC
  Administered 2012-10-12 – 2012-10-15 (×4): 40 mg via ORAL
  Filled 2012-10-12 (×4): qty 1

## 2012-10-12 MED ORDER — DEXTROSE 5 % IV SOLN: 2.0000 g | Freq: Once | INTRAVENOUS | Status: DC

## 2012-10-12 MED ORDER — VANCOMYCIN HCL IN DEXTROSE 1-5 GM/200ML-% IV SOLN
1000.0000 mg | Freq: Once | INTRAVENOUS | Status: AC
  Administered 2012-10-12: 1000 mg via INTRAVENOUS
  Filled 2012-10-12: qty 200

## 2012-10-12 MED ORDER — GABAPENTIN 300 MG PO CAPS
600.0000 mg | ORAL_CAPSULE | Freq: Three times a day (TID) | ORAL | Status: DC
  Filled 2012-10-12 (×3): qty 2

## 2012-10-12 MED ORDER — ONDANSETRON HCL 4 MG/2ML IJ SOLN: 4.0000 mg | Freq: Four times a day (QID) | INTRAMUSCULAR | Status: DC | PRN

## 2012-10-12 MED ORDER — VANCOMYCIN HCL IN DEXTROSE 1-5 GM/200ML-% IV SOLN
1000.0000 mg | Freq: Three times a day (TID) | INTRAVENOUS | Status: DC
  Administered 2012-10-12 – 2012-10-13 (×3): 1000 mg via INTRAVENOUS
  Filled 2012-10-12 (×5): qty 200

## 2012-10-12 MED ORDER — SODIUM CHLORIDE 0.9 % IJ SOLN: 3.0000 mL | Freq: Two times a day (BID) | INTRAMUSCULAR | Status: DC

## 2012-10-12 MED ORDER — GABAPENTIN 600 MG PO TABS
600.0000 mg | ORAL_TABLET | Freq: Three times a day (TID) | ORAL | Status: DC
  Administered 2012-10-12 – 2012-10-15 (×11): 600 mg via ORAL
  Filled 2012-10-12 (×12): qty 1

## 2012-10-12 MED ORDER — ACETAMINOPHEN 325 MG PO TABS: 650.0000 mg | ORAL_TABLET | Freq: Four times a day (QID) | ORAL | Status: DC | PRN

## 2012-10-12 MED ORDER — VITAMIN B-6 100 MG PO TABS
100.0000 mg | ORAL_TABLET | Freq: Every day | ORAL | Status: DC
  Administered 2012-10-12 – 2012-10-15 (×4): 100 mg via ORAL
  Filled 2012-10-12 (×4): qty 1

## 2012-10-12 MED ORDER — PNEUMOCOCCAL VAC POLYVALENT 25 MCG/0.5ML IJ INJ
0.5000 mL | INJECTION | INTRAMUSCULAR | Status: AC
  Filled 2012-10-12: qty 0.5

## 2012-10-12 MED ORDER — ACETAMINOPHEN 650 MG RE SUPP: 650.0000 mg | Freq: Four times a day (QID) | RECTAL | Status: DC | PRN

## 2012-10-12 MED ORDER — CLINDAMYCIN PHOSPHATE 600 MG/50ML IV SOLN
600.0000 mg | Freq: Three times a day (TID) | INTRAVENOUS | Status: DC
  Administered 2012-10-12 – 2012-10-15 (×10): 600 mg via INTRAVENOUS
  Filled 2012-10-12 (×14): qty 50

## 2012-10-12 MED ORDER — SODIUM CHLORIDE 0.9 % IV SOLN
INTRAVENOUS | Status: AC
  Administered 2012-10-12: 08:00:00 via INTRAVENOUS

## 2012-10-12 MED ORDER — CEFEPIME HCL 1 G IJ SOLR
1.0000 g | Freq: Three times a day (TID) | INTRAMUSCULAR | Status: DC
  Administered 2012-10-12 – 2012-10-15 (×10): 1 g via INTRAVENOUS
  Filled 2012-10-12 (×13): qty 1

## 2012-10-12 MED ORDER — INSULIN ASPART 100 UNIT/ML ~~LOC~~ SOLN
0.0000 [IU] | Freq: Three times a day (TID) | SUBCUTANEOUS | Status: DC
  Administered 2012-10-12: 3 [IU] via SUBCUTANEOUS
  Administered 2012-10-12 (×2): 2 [IU] via SUBCUTANEOUS
  Administered 2012-10-13: 5 [IU] via SUBCUTANEOUS
  Administered 2012-10-13: 2 [IU] via SUBCUTANEOUS
  Administered 2012-10-13 – 2012-10-14 (×2): 3 [IU] via SUBCUTANEOUS

## 2012-10-12 MED ORDER — GADOBENATE DIMEGLUMINE 529 MG/ML IV SOLN
18.0000 mL | Freq: Once | INTRAVENOUS | Status: AC
  Administered 2012-10-12: 18 mL via INTRAVENOUS

## 2012-10-12 MED ORDER — ASPIRIN EC 325 MG PO TBEC
325.0000 mg | DELAYED_RELEASE_TABLET | Freq: Every day | ORAL | Status: DC
Start: 2012-10-12 — End: 2012-10-15
  Administered 2012-10-12 – 2012-10-15 (×4): 325 mg via ORAL
  Filled 2012-10-12 (×3): qty 1

## 2012-10-12 NOTE — Progress Notes (Signed)
*  PRELIMINARY RESULTS* Echocardiogram 2D Echocardiogram has been performed.  Jeryl Columbia 10/12/2012, 4:33 PM

## 2012-10-12 NOTE — H&P (Signed)
[facility] History and Physical  Oryon Gary BJY:[id number] DOB: 01-28-1941 DOA: 10/12/2012  Referring physician: Dr.Bonk. PCP: [name], NP  Specialists: Dr.Duda.  Chief Complaint: Chest pain and right foot increased secretions.  HPI: [name] is a 72 y.o. male history of diabetes mellitus type 2 and tobacco abuse who has had a recent right foot second toe amputation presents with complaints of chest pain. Patient also has been noticing increasing secretion from the right foot where he had recent surgery. Patient also has some subjective feeling of fever chills. Patient states that he's been having chest pain for last 2 days mostly the right anterior chest wall which was pressure-like had been increased on exertion. Last for a half an hour each day. Presently he came to the ER his chest pain had resolved. EKG shows sinus rhythm with RBBB. Cardiac exam is negative. Due to the increased secretion from the right foot x-ray was of the foot was done showed some gas. Patient at this time has been admitted for chest pain and possible ostomy and it is of the right foot.  Review of Systems: As presented in the history of presenting illness, rest negative.  Past Medical History  Diagnosis Date  . Osteomyelitis     Right great toe  . Hypertension   . Arthritis   . Diabetes mellitus     type 2  . Peripheral vascular disease     diabetic  with osteomylitis   Past Surgical History  Procedure Laterality Date  . L foot surgery  2006  . Circumcision  2010  . Foot fracture surgery Left   . Left arm  plate Right   . Right great toe amputation  07/23/2011  . Amputation  07/23/2011    Procedure: AMPUTATION DIGIT;  Surgeon: Nadara Mustard, MD;  Location: [facility] OR;  Service: Orthopedics;  Laterality: Right;  Right Great Toe Amputation  . Amputation Right 09/18/2012    Procedure: Right Foot 2nd Ray Amputation;  Surgeon: Nadara Mustard, MD;  Location: [facility] OR;  Service: Orthopedics;  Laterality:  Right;  Right Foot 2nd Ray Amputation   Social History:  reports that he has been smoking Pipe.  He has never used smokeless tobacco. He reports that he does not drink alcohol or use illicit drugs. Lives at home. where does patient live-- Can do ADLs. Can patient participate in ADLs?  No Known Allergies  Family History  Problem Relation Age of Onset  . Anesthesia problems Neg Hx   . Diabetes Mellitus II Other       Prior to Admission medications   Medication Sig Start Date End Date Taking? Authorizing Provider  acetaminophen (TYLENOL) 500 MG tablet Take 500-1,000 mg by mouth every 6 (six) hours as needed for pain.   Yes Historical Provider, MD  Cyanocobalamin (VITAMIN B-12 PO) Take 1 tablet by mouth daily.   Yes Historical Provider, MD  gabapentin (NEURONTIN) 300 MG capsule Take 600 mg by mouth 3 (three) times daily.   Yes Historical Provider, MD  glipiZIDE (GLUCOTROL) 5 MG tablet Take 5 mg by mouth daily.    Yes Historical Provider, MD  HYDROcodone-acetaminophen (NORCO) 10-325 MG per tablet Take 1 tablet by mouth every 6 (six) hours as needed for pain. 09/18/12  Yes Nadara Mustard, MD  ibuprofen (ADVIL,MOTRIN) 200 MG tablet Take 400 mg by mouth every 6 (six) hours as needed for pain.   Yes Historical Provider, MD  metFORMIN (GLUCOPHAGE) 1000 MG tablet Take 1,000 mg by  mouth 2 (two) times daily with a meal.    Yes Historical Provider, MD  pyridOXINE (VITAMIN B-6) 100 MG tablet Take 100 mg by mouth daily.   Yes Historical Provider, MD  silver sulfADIAZINE (SILVADENE) 1 % cream Apply topically daily.   Yes Historical Provider, MD   Physical Exam: Filed Vitals:   10/12/12 0415 10/12/12 0430 10/12/12 0500 10/12/12 0545  BP: 107/58 114/60 106/65 136/70  Pulse: 72 69 73 73  Temp:      TempSrc:      Resp: 15 21 21 19   Height:   6\' 1"  (1.854 m)   Weight:   88 kg (194 lb 0.1 oz)   SpO2: 95% 96% 98% 98%     General:  Well-developed well-nourished.  Eyes: Anicteric no pallor.  ENT: No  discharge from ears eyes nose or mouth.  Neck: No mass felt.  Cardiovascular: S1-S2 heard.  Respiratory: No rhonchi no crepitations.  Abdomen: Soft nontender bowel sounds present.  Skin: No rash.  Musculoskeletal: Right foot distal aspect of the remaining second metatarsal is having active discharge.  Psychiatric: Appears normal.  Neurologic: Alert awake oriented to time place and person. Moves all extremities.  Labs on Admission:  Basic Metabolic Panel:  Recent Labs Lab 10/11/12 2341  NA 132*  K 3.4*  CL 94*  CO2 25  GLUCOSE 201*  BUN 8  CREATININE 0.89  CALCIUM 9.2   Liver Function Tests: No results found for this basename: AST, ALT, ALKPHOS, BILITOT, PROT, ALBUMIN,  in the last 168 hours No results found for this basename: LIPASE, AMYLASE,  in the last 168 hours No results found for this basename: AMMONIA,  in the last 168 hours CBC:  Recent Labs Lab 10/11/12 2341  WBC 8.3  HGB 12.4*  HCT 36.3*  MCV 79.8  PLT 327   Cardiac Enzymes: No results found for this basename: CKTOTAL, CKMB, CKMBINDEX, TROPONINI,  in the last 168 hours  BNP (last 3 results) No results found for this basename: PROBNP,  in the last 8760 hours CBG: No results found for this basename: GLUCAP,  in the last 168 hours  Radiological Exams on Admission: Dg Chest 2 View  10/11/2012  *RADIOLOGY REPORT*  Clinical Data: Chest pain and pressure this afternoon.  Smoker.  CHEST - 2 VIEW  Comparison: 09/16/2012  Findings: The heart size and pulmonary vascularity are normal. The lungs appear clear and expanded without focal air space disease or consolidation. No blunting of the costophrenic angles.  No pneumothorax.  Mediastinal contours appear intact.  Tortuous aorta. Degenerative changes in the spine.  No significant change since previous study.  IMPRESSION: No evidence of active pulmonary disease.   Original Report Authenticated By: Burman Nieves, M.D.    Dg Foot Complete Right  10/12/2012   *RADIOLOGY REPORT*  Clinical Data: Right foot pain.  History of diabetes.  RIGHT FOOT COMPLETE - 3+ VIEW  Comparison: 03/08/2011  Findings: Since the previous study, there has been interval amputation of the right first ray at the tarsometatarsal joint level.  There has been amputation of the right second ray at the level of the distal metatarsal shaft.  There is callus formation around the site of amputation.  There is been amputation or resorption of the distal aspect of the proximal phalanx of the right third toe with apparent dislocation of the third metatarsal phalangeal joint.  Lateral view demonstrates a somewhat rocker bottom configuration of the foot.  There is evidence of erosion of the distal  phalangeal tuft of the right fourth toe with focal soft tissue gas anteriorly.  Osteomyelitis here is not excluded.  No acute fracture or subluxation is otherwise identified.  Vascular calcifications in the soft tissues.  No radiopaque soft tissue foreign bodies.  IMPRESSION: Previous amputations of the right first and second rays as discussed.  Resection or resorption of the distal aspect of the proximal phalanx of the right third toe.  Suggestion of erosion of the distal phalangeal tuft of the fourth toe with possible soft tissue gas.  Osteomyelitis is not excluded.   Original Report Authenticated By: Burman Nieves, M.D.     EKG: Independently reviewed. Normal sinus rhythm with RBBB.  Assessment/Plan Principal Problem:   Cellulitis Active Problems:   DIABETES MELLITUS, TYPE II   Chest pain   Tobacco abuse   1. Right foot cellulitis with possible osteomyelitis - ordered MRI of the right foot. Patient has been placed on vancomycin cefepime and clindamycin. Patient will be taken nightly and patient possible procedure. Please consult Dr. [name] for further recommendations. Follow blood cultures. 2. Chest pain - presently chest pain-free. Cycle correct markers. Check d-dimer. Aspirin. 3. Diabetes  mellitus type 2 - since patient is n.p.o. patient has been placed on sliding-scale coverage. Check hemoglobin A1c. 4. Tobacco abuse - strongly advised to quit smoking.    Code Status: Full code.  Family Communication: None.  Disposition Plan: Admit to inpatient.    Yeny Schmoll N. [facility] Pager [phone].  If 7PM-7AM, please contact night-coverage [email or web address] Password [facility] 10/12/2012, 6:11 AM

## 2012-10-12 NOTE — Consult Note (Addendum)
Reason for Consult: Abscess right forefoot status post amputation great toe and second toe Referring Physician: [name] is an 72 y.o. male.  HPI: Patient is a 72 year old gentleman diabetes peripheral vascular disease she is status post amputation of the great toe and second toe right foot. Patient states he got sick this weekend and was admitted to the hospital he is currently in IV antibiotics. Patient is seen for evaluation and treatment for a new abscess right forefoot.  Past Medical History  Diagnosis Date  . Osteomyelitis     Right great toe  . Hypertension   . Arthritis   . Peripheral vascular disease     diabetic  with osteomylitis  . High cholesterol   . Type II diabetes mellitus     Past Surgical History  Procedure Laterality Date  . Incision and drainage of wound Left 2006    "foot" (10/12/2012)  . Circumcision  2010  . Foot fracture surgery Left 1970's    "broke it playing football" (10/12/2012)  . Humerus fracture surgery w/ implant Right 1975>    "put a plate in it" (1/61/0960)  . Amputation  07/23/2011    Procedure: AMPUTATION DIGIT;  Surgeon: Casey Mustard, MD;  Location: [facility] OR;  Service: Orthopedics;  Laterality: Right;  Right Great Toe Amputation  . Amputation Right 09/18/2012    Procedure: Right Foot 2nd Ray Amputation;  Surgeon: Casey Mustard, MD;  Location: [facility] OR;  Service: Orthopedics;  Laterality: Right;  Right Foot 2nd Ray Amputation  . Back surgery    . Lumbar disc surgery  2008    Family History  Problem Relation Age of Onset  . Anesthesia problems Neg Hx   . Diabetes Mellitus II Other     Social History:  reports that he has been smoking Pipe.  He has never used smokeless tobacco. He reports that he does not drink alcohol or use illicit drugs.  Allergies: No Known Allergies  Medications: I have reviewed the patient's current medications.  Results for orders placed during the hospital encounter of 10/12/12 (from the past 48 hour(s))   CBC     Status: Abnormal   Collection Time    10/11/12 11:41 PM      Result Value Range   WBC 8.3  4.0 - 10.5 K/uL   RBC 4.55  4.22 - 5.81 MIL/uL   Hemoglobin 12.4 (*) 13.0 - 17.0 g/dL   HCT 45.4 (*) 09.8 - 11.9 %   MCV 79.8  78.0 - 100.0 fL   MCH 27.3  26.0 - 34.0 pg   MCHC 34.2  30.0 - 36.0 g/dL   RDW 14.7  82.9 - 56.2 %   Platelets 327  150 - 400 K/uL  BASIC METABOLIC PANEL     Status: Abnormal   Collection Time    10/11/12 11:41 PM      Result Value Range   Sodium 132 (*) 135 - 145 mEq/L   Potassium 3.4 (*) 3.5 - 5.1 mEq/L   Chloride 94 (*) 96 - 112 mEq/L   CO2 25  19 - 32 mEq/L   Glucose, Bld 201 (*) 70 - 99 mg/dL   BUN 8  6 - 23 mg/dL   Creatinine, Ser 1.30  0.50 - 1.35 mg/dL   Calcium 9.2  8.4 - 86.5 mg/dL   GFR calc non Af Amer 85 (*) >90 mL/min   GFR calc Af Amer >90  >90 mL/min   Comment:  The eGFR has been calculated     using the CKD EPI equation.     This calculation has not been     validated in all clinical     situations.     eGFR's persistently     <90 mL/min signify     possible Chronic Kidney Disease.  POCT I-STAT TROPONIN I     Status: None   Collection Time    10/11/12 11:53 PM      Result Value Range   Troponin i, poc 0.02  0.00 - 0.08 ng/mL   Comment 3            Comment: Due to the release kinetics of cTnI,     a negative result within the first hours     of the onset of symptoms does not rule out     myocardial infarction with certainty.     If myocardial infarction is still suspected,     repeat the test at appropriate intervals.  GLUCOSE, CAPILLARY     Status: Abnormal   Collection Time    10/12/12  7:50 AM      Result Value Range   Glucose-Capillary 197 (*) 70 - 99 mg/dL  TROPONIN I     Status: None   Collection Time    10/12/12  9:20 AM      Result Value Range   Troponin I <0.30  <0.30 ng/mL   Comment:            Due to the release kinetics of cTnI,     a negative result within the first hours     of the onset of  symptoms does not rule out     myocardial infarction with certainty.     If myocardial infarction is still suspected,     repeat the test at appropriate intervals.  D-DIMER, QUANTITATIVE     Status: Abnormal   Collection Time    10/12/12  9:20 AM      Result Value Range   D-Dimer, Quant 1.23 (*) 0.00 - 0.48 ug/mL-FEU   Comment:            AT THE INHOUSE ESTABLISHED CUTOFF     VALUE OF 0.48 ug/mL FEU,     THIS ASSAY HAS BEEN DOCUMENTED     IN THE LITERATURE TO HAVE     A SENSITIVITY AND NEGATIVE     PREDICTIVE VALUE OF AT LEAST     98 TO 99%.  THE TEST RESULT     SHOULD BE CORRELATED WITH     AN ASSESSMENT OF THE CLINICAL     PROBABILITY OF DVT / VTE.  SEDIMENTATION RATE     Status: Abnormal   Collection Time    10/12/12 11:04 AM      Result Value Range   Sed Rate 88 (*) 0 - 16 mm/hr  GLUCOSE, CAPILLARY     Status: Abnormal   Collection Time    10/12/12 11:46 AM      Result Value Range   Glucose-Capillary 169 (*) 70 - 99 mg/dL  TROPONIN I     Status: None   Collection Time    10/12/12  2:16 PM      Result Value Range   Troponin I <0.30  <0.30 ng/mL   Comment:            Due to the release kinetics of cTnI,     a negative result within the first hours  of the onset of symptoms does not rule out     myocardial infarction with certainty.     If myocardial infarction is still suspected,     repeat the test at appropriate intervals.    Dg Chest 2 View  10/11/2012  *RADIOLOGY REPORT*  Clinical Data: Chest pain and pressure this afternoon.  Smoker.  CHEST - 2 VIEW  Comparison: 09/16/2012  Findings: The heart size and pulmonary vascularity are normal. The lungs appear clear and expanded without focal air space disease or consolidation. No blunting of the costophrenic angles.  No pneumothorax.  Mediastinal contours appear intact.  Tortuous aorta. Degenerative changes in the spine.  No significant change since previous study.  IMPRESSION: No evidence of active pulmonary disease.    Original Report Authenticated By: [name], M.D.    Mr Foot Right W Wo Contrast  10/12/2012  *RADIOLOGY REPORT*  Clinical Data: Foot infection.  Status post amputation of the second ray at the level of the distal second metatarsal 09/18/2012. Also status post amputation of the first ray in 2013.  MRI OF THE RIGHT FOREFOOT WITHOUT AND WITH CONTRAST  Technique:  Multiplanar, multisequence MR imaging was performed both before and after administration of intravenous contrast.  Contrast: 18mL MULTIHANCE GADOBENATE DIMEGLUMINE 529 MG/ML IV SOLN  Comparison: MRI right foot 03/16/2011.  Plain films 10/12/2012 and 08/16/1988.  Findings: There is intense marrow edema and enhancement throughout the second metatarsal extending to the tarsometatarsal joint. Intense marrow edema and enhancement are also seen throughout the third metatarsal and toe. The distal right two thirds of the proximal phalanx of the second toe is not visualized and may have been resected or may be destroyed secondary to infection.  Edema and enhancement are also seen in the third toe, worst in the proximal phalanx, in the head of the fourth metatarsal and in the fourth toe.  Bone marrow signal is otherwise unremarkable.  Soft tissues of the foot demonstrate diffuse edema and intense contrast enhancement consistent with cellulitis. There is a wound in the plantar soft tissues just distal to the surgical site of the second metatarsal which extends to the head of the third metatarsal. A small rim enhancing fluid collection is seen off the amputation site of the second metatarsal compatible with an abscess measuring 1.4 cm cranial-caudal by 0.4 cm transverse by 0.3 cm long.  IMPRESSION:  1.  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 2.  Intense cellulitis of the foot.  Surgical wound in the plantar soft tissues  extends to the head of the third metatarsal.  There is a small abscess off the remaining distal second metatarsal.   Original Report Authenticated By: Holley Dexter, M.D.    Dg Foot Complete Right  10/12/2012  *RADIOLOGY REPORT*  Clinical Data: Right foot pain.  History of diabetes.  RIGHT FOOT COMPLETE - 3+ VIEW  Comparison: 03/08/2011  Findings: Since the previous study, there has been interval amputation of the right first ray at the tarsometatarsal joint level.  There has been amputation of the right second ray at the level of the distal metatarsal shaft.  There is callus formation around the site of amputation.  There is been amputation or resorption of the distal aspect of the proximal phalanx of the right third toe with apparent dislocation of the third metatarsal phalangeal joint.  Lateral view demonstrates a somewhat rocker bottom  configuration of the foot.  There is evidence of erosion of the distal phalangeal tuft of the right fourth toe with focal soft tissue gas anteriorly.  Osteomyelitis here is not excluded.  No acute fracture or subluxation is otherwise identified.  Vascular calcifications in the soft tissues.  No radiopaque soft tissue foreign bodies.  IMPRESSION: Previous amputations of the right first and second rays as discussed.  Resection or resorption of the distal aspect of the proximal phalanx of the right third toe.  Suggestion of erosion of the distal phalangeal tuft of the fourth toe with possible soft tissue gas.  Osteomyelitis is not excluded.   Original Report Authenticated By: [name], M.D.     Review of Systems  All other systems reviewed and are negative.   Blood pressure 127/76, pulse 60, temperature 98 F (36.7 C), temperature source Oral, resp. rate 18, height 6\' 1"  (1.854 m), weight 87.544 kg (193 lb), SpO2 99.00%. Physical Exam On examination patient is a good palpable dorsalis pedis pulse examination the forefoot the surgical incision is healed well but  he is developed an abscess beneath the third metatarsal head. There is purulent drainage there is no a sending cellulitis. Review of the MRI scan does show abscess in the forefoot as well as osteomyelitis involving the metatarsals. Assessment/Plan: Assessment: Abscess right forefoot with osteomyelitis of the second and third metatarsal. status post first and second toe amputation.  Plan: Discussed with this infection in the forefoot patient will require at least a transmetatarsal amputation. He does have a good dorsalis pedis pulse.  Safest option would be a transtibial amputation but patient does want to pursue foot salvage surgery. We will try a midfoot amputation proximal to the base of the metatarsals. I will schedule surgery for add-on this week. [name] 10/12/2012, 5:40 PM

## 2012-10-12 NOTE — ED Notes (Signed)
Patient resting on stretcher at this time. Patient complaining of headache, right side pain, and right side and mid chest pain. Patient states it began yesterday around 1500. Patient also has wound to foot, had second toe removed. Patient states it has been draining, goes to the doctor for recheck on Monday. Wound has foul odor coming from it and drainage on bandage. Plan of care discussed with patient. Call light at bedside. Will continue to monitor.

## 2012-10-12 NOTE — Progress Notes (Signed)
ANTIBIOTIC CONSULT NOTE - INITIAL  Pharmacy Consult for Vancocin and Maxipime Indication: r/o osteomyelitis  No Known Allergies  Patient Measurements: Height: 6\' 1"  (185.4 cm) Weight: 194 lb 0.1 oz (88 kg) IBW/kg (Calculated) : 79.9  Vital Signs: Temp: 99.7 F (37.6 C) (04/27 2103) Temp src: Oral (04/27 2103) BP: 126/64 mmHg (04/28 0615) Pulse Rate: 74 (04/28 0615)  Labs:  Recent Labs  10/11/12 2341  WBC 8.3  HGB 12.4*  PLT 327  CREATININE 0.89   Estimated Creatinine Clearance: 88.5 ml/min (by C-G formula based on Cr of 0.89).  Microbiology: Recent Results (from the past 720 hour(s))  SURGICAL PCR SCREEN     Status: None   Collection Time    09/16/12  1:18 PM      Result Value Range Status   MRSA, PCR NEGATIVE  NEGATIVE Final   Staphylococcus aureus NEGATIVE  NEGATIVE Final   Comment:            The Xpert SA Assay (FDA     approved for NASAL specimens     in patients over 33 years of age),     is one component of     a comprehensive surveillance     program.  Test performance has     been validated by [employer] for patients greater     than or equal to 49 year old.     It is not intended     to diagnose infection nor to     guide or monitor treatment.    Medical History: Past Medical History  Diagnosis Date  . Osteomyelitis     Right great toe  . Hypertension   . Arthritis   . Diabetes mellitus     type 2  . Peripheral vascular disease     diabetic  with osteomylitis    Medications:  Prescriptions prior to admission  Medication Sig Dispense Refill  . acetaminophen (TYLENOL) 500 MG tablet Take 500-1,000 mg by mouth every 6 (six) hours as needed for pain.      . Cyanocobalamin (VITAMIN B-12 PO) Take 1 tablet by mouth daily.      Marland Kitchen gabapentin (NEURONTIN) 300 MG capsule Take 600 mg by mouth 3 (three) times daily.      Marland Kitchen glipiZIDE (GLUCOTROL) 5 MG tablet Take 5 mg by mouth daily.       Marland Kitchen HYDROcodone-acetaminophen (NORCO) 10-325 MG per tablet  Take 1 tablet by mouth every 6 (six) hours as needed for pain.  60 tablet  0  . ibuprofen (ADVIL,MOTRIN) 200 MG tablet Take 400 mg by mouth every 6 (six) hours as needed for pain.      . metFORMIN (GLUCOPHAGE) 1000 MG tablet Take 1,000 mg by mouth 2 (two) times daily with a meal.       . pyridOXINE (VITAMIN B-6) 100 MG tablet Take 100 mg by mouth daily.      . silver sulfADIAZINE (SILVADENE) 1 % cream Apply topically daily.       Scheduled:  . aspirin EC  325 mg Oral Daily  . ceFEPime (MAXIPIME) IV  2 g Intravenous Once  . clindamycin (CLEOCIN) IV  600 mg Intravenous Q8H  . gabapentin  600 mg Oral TID  . insulin aspart  0-9 Units Subcutaneous TID WC  . pyridOXINE  100 mg Oral Daily  . sodium chloride  3 mL Intravenous Q12H  . [COMPLETED] vancomycin  1,000 mg Intravenous Once    Assessment: 72yo  male came to ED to c/o HA and body pain but foot wound noted by EDMD to need attention, had second toe removed, has been draining and emits foul odor, pt reports he was Rx'd ABX but doesn't take them, has not changed the dressing in 1wk, osteo cannot be excluded on Xray, to begin IV ABX.  Goal of Therapy:  Vancomycin trough level 15-20 mcg/ml  Plan:  Rec'd vanc 1g and cefepime 2g IV in ED; will continue with vancomycin 1g IV Q8H and cefepime 1g IV Q8H and monitor CBC, Cx, levels prn.  Vernard Gambles, PharmD, BCPS  10/12/2012,7:44 AM

## 2012-10-12 NOTE — ED Notes (Signed)
Patient report called to [facility] for admission, nurse voiced understanding of report and had no further questions. Patient is in stable condition at this time for transport. Patient taken to room via stretcher to room [phone].

## 2012-10-12 NOTE — Progress Notes (Addendum)
Patient seen and examined. No CP or SOB at this moment. VS demonstrated regular HR and BP. Patient with right foot recent partial amputation due to diabetic foot ulcer; with increase secretions, subjective fever/chills and x-ray demonstrating some gas. Referred to Dr. Katherene Ponto H&P for further details/info on admission.  Plan: -Dr. [name] contacted for evaluation and further recommendations -continue empiric antibiotics for ?? osteomyelitis -Follow foot MRI -Cycle cardiac enzymes, repeat EKG in am and order 2-D echo. Will continue ASA; D-dimer elevated but w/o SOB, tachycardia, tachypnea or any further CP. Will hold on CT angio of the chest. -started on PPI -Further decision and recommendations based on patient evolution.  [name] [phone]

## 2012-10-12 NOTE — ED Notes (Signed)
Patient denies any pain to wound on foot. Patient states he was prescribed antibiotics but has not been taking them at home. States he also has not changed the dressing since last Monday. Educated patient on importance of wound cleaning/dressing and taking his medication. Patient voiced understanding.

## 2012-10-12 NOTE — Progress Notes (Signed)
UR Completed Amedee Cerrone Graves-Bigelow, RN,BSN [phone]  

## 2012-10-12 NOTE — ED Notes (Signed)
Patient sleeping at this time. No acute distress noted, resp are even and unlabored. Will continue to monitor. Call light on bed.

## 2012-10-12 NOTE — ED Notes (Signed)
Lab in room to obtain blood cultures

## 2012-10-12 NOTE — ED Notes (Signed)
Hospitalist in room at this time.  

## 2012-10-13 DIAGNOSIS — M869 Osteomyelitis, unspecified: Secondary | ICD-10-CM

## 2012-10-13 DIAGNOSIS — K219 Gastro-esophageal reflux disease without esophagitis: Secondary | ICD-10-CM

## 2012-10-13 LAB — CBC
HCT: 34.2 % — ABNORMAL LOW (ref 39.0–52.0)
Hemoglobin: 11.7 g/dL — ABNORMAL LOW (ref 13.0–17.0)
MCH: 27.5 pg (ref 26.0–34.0)
MCHC: 34.2 g/dL (ref 30.0–36.0)
MCV: 80.5 fL (ref 78.0–100.0)
Platelets: 303 10*3/uL (ref 150–400)
RBC: 4.25 MIL/uL (ref 4.22–5.81)
RDW: 14.1 % (ref 11.5–15.5)
WBC: 6.2 10*3/uL (ref 4.0–10.5)

## 2012-10-13 LAB — GLUCOSE, CAPILLARY
Glucose-Capillary: 262 mg/dL — ABNORMAL HIGH (ref 70–99)
Glucose-Capillary: 224 mg/dL — ABNORMAL HIGH (ref 70–99)
Glucose-Capillary: 200 mg/dL — ABNORMAL HIGH (ref 70–99)
Glucose-Capillary: 244 mg/dL — ABNORMAL HIGH (ref 70–99)

## 2012-10-13 LAB — BASIC METABOLIC PANEL
BUN: 8 mg/dL (ref 6–23)
CO2: 24 mEq/L (ref 19–32)
Calcium: 8.8 mg/dL (ref 8.4–10.5)
Chloride: 97 mEq/L (ref 96–112)
Creatinine, Ser: 0.77 mg/dL (ref 0.50–1.35)
GFR calc Af Amer: >90 mL/min (ref >90)
GFR calc non Af Amer: >90 mL/min (ref >90)
Glucose, Bld: 285 mg/dL — ABNORMAL HIGH (ref 70–99)
Potassium: 3.5 mEq/L (ref 3.5–5.1)
Sodium: 133 mEq/L — ABNORMAL LOW (ref 135–145)

## 2012-10-13 LAB — LIPID PANEL
Cholesterol: 163 mg/dL (ref 0–200)
HDL: 20 mg/dL — ABNORMAL LOW (ref >39)
LDL Cholesterol: 118 mg/dL — ABNORMAL HIGH (ref 0–99)
Total CHOL/HDL Ratio: 8.2 RATIO
Triglycerides: 124 mg/dL (ref <150)
VLDL: 25 mg/dL (ref 0–40)

## 2012-10-13 LAB — SURGICAL PCR SCREEN
MRSA, PCR: NEGATIVE
Staphylococcus aureus: NEGATIVE

## 2012-10-13 MED ORDER — INSULIN DETEMIR 100 UNIT/ML ~~LOC~~ SOLN
8.0000 [IU] | Freq: Every day | SUBCUTANEOUS | Status: DC
  Filled 2012-10-13: qty 0.08

## 2012-10-13 MED ORDER — DOCUSATE SODIUM 50 MG PO CAPS
50.0000 mg | ORAL_CAPSULE | Freq: Two times a day (BID) | ORAL | Status: DC
  Administered 2012-10-13 – 2012-10-15 (×4): 50 mg via ORAL
  Filled 2012-10-13 (×5): qty 1

## 2012-10-13 MED ORDER — CHLORHEXIDINE GLUCONATE 4 % EX LIQD
60.0000 mL | Freq: Once | CUTANEOUS | Status: AC
  Administered 2012-10-14: 4 via TOPICAL
  Filled 2012-10-13: qty 60

## 2012-10-13 MED ORDER — CLINDAMYCIN PHOSPHATE 900 MG/50ML IV SOLN
900.0000 mg | INTRAVENOUS | Status: DC
  Administered 2012-10-14: 900 mg via INTRAVENOUS
  Filled 2012-10-13: qty 50

## 2012-10-13 MED ORDER — POLYETHYLENE GLYCOL 3350 17 G PO PACK
17.0000 g | PACK | Freq: Every day | ORAL | Status: DC
  Administered 2012-10-13 – 2012-10-15 (×3): 17 g via ORAL
  Filled 2012-10-13 (×3): qty 1

## 2012-10-13 MED ORDER — VANCOMYCIN HCL 10 G IV SOLR
1250.0000 mg | Freq: Two times a day (BID) | INTRAVENOUS | Status: DC
  Administered 2012-10-13 – 2012-10-15 (×4): 1250 mg via INTRAVENOUS
  Filled 2012-10-13 (×5): qty 1250

## 2012-10-13 MED ORDER — INSULIN PEN STARTER KIT
1.0000 | Freq: Once | Status: AC
  Administered 2012-10-13: 1
  Filled 2012-10-13: qty 1

## 2012-10-13 MED ORDER — NICOTINE 14 MG/24HR TD PT24
14.0000 mg | MEDICATED_PATCH | Freq: Every day | TRANSDERMAL | Status: DC
  Administered 2012-10-13 – 2012-10-15 (×3): 14 mg via TRANSDERMAL
  Filled 2012-10-13 (×3): qty 1

## 2012-10-13 MED ORDER — INSULIN GLARGINE 100 UNIT/ML ~~LOC~~ SOLN
8.0000 [IU] | Freq: Every day | SUBCUTANEOUS | Status: DC
  Administered 2012-10-13: 8 [IU] via SUBCUTANEOUS
  Filled 2012-10-13 (×2): qty 0.08

## 2012-10-13 NOTE — Progress Notes (Addendum)
Inpatient Diabetes Program Recommendations  AACE/ADA: New Consensus Statement on Inpatient Glycemic Control (2013)  Target Ranges:  Prepandial:   less than 140 mg/dL      Peak postprandial:   less than 180 mg/dL (1-2 hours)      Critically ill patients:  140 - 180 mg/dL    Admitted with cellulitis.  For transmetatarsal amputation tomorrow with Dr. [name].  A1c of 11.3% (10/12/12) shows extrmeley poor glucose control at home.  Spoke with patient about his A1c.  Patient told me he stopped taking his oral diabetes medications a while ago.  Was on insulin at one point, however, patient told me he got sick of taking insulin and threw his insulin away.  Was not able to tell me what insulin he took, however, patient did tell me it was an insulin pen.  Spoke to patient about the importance of maintaining good glucose control to prevent further complications and to promote healing of his foot.  Patient told me he would take insulin again if needed.  Patient told me he just got sick of taking medications, however, he does not want to lose any more of his foot and is willing to do insulin now.  Educated patient on insulin pen use at home.  Reviewed contents of insulin flexpen starter kit.  Reviewed all steps if insulin pen including attachment of needle, 2-unit air shot, dialing up dose, giving injection, removing needle, disposal of sharps, storage of unused insulin, disposal of insulin etc.  Patient able to provide successful return demonstration.  Also reviewed troubleshooting with insulin pen.  MD to give patient Rxs for insulin pens and insulin pen needles if patient is to be discharged home on insulin.  MD- Patient would prefer to use insulin pens at home if decision is made to send patient home on insulin.  Patient has Medicaid coverage and can get insulin pens for $3 with his Medicaid.  Please switch to Lantus insulin, as Medicaid will only cover Lantus solostar insulin pens.   Will follow. Ambrose Finland RN, MSN, CDE Diabetes Coordinator Inpatient Diabetes Program [phone]

## 2012-10-13 NOTE — Progress Notes (Signed)
[facility] PROGRESS NOTE  [name] VHQ:[id number] DOB: 1941/04/15 DOA: 10/12/2012 PCP: [name], NP  Assessment/Plan: 1-Right foot osteomyelitis and abscess: patient MRI demonstrated osteomyelitis on his right foot.  -Dr. [name] orthopedic service consulted and the plan is for surgery on Wednesday  -continue antibiotics -continue PRN analgesics  2-CP: no further chest discomfort; most likely secondary to GERD. -CE'z neg X3; no abnormalities on telemetry or EKG for acute ischemia and no wall motion abnormalities on 2-D echo.  3-DM: A1C 11.3; CBG's in the 250-280 range. Will start on levemir for better control. Continue SSI.  4-GERD: continue PPI  5-Tobaco abuse: counseling provided. Nicotine patch ordered  6-Neuropathy: continue gabapentin.  DVT:SCD's   Code Status: Full code Family Communication: no family at bedside Disposition Plan: to be determine after surgery   Consultants:  Orthopedic service  Procedures:  Right foot MRI (IMPRESSION: 1. 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 2. Intense cellulitis of the foot. Surgical wound in the plantar soft tissues extends to the head of the third metatarsal. There is a small abscess off the remaining distal second metatarsal)   2-D echo (preserved EF, no wall motion abnormalities; findings consistent with grade 2 diastolic dysfunction)  Antibiotics:  Vanc, cefepime and clindamycin   HPI/Subjective: Afebrile, no CP or SOB.  Objective: Filed Vitals:   10/12/12 0803 10/12/12 1442 10/12/12 2100 10/13/12 0604  BP: 127/72 127/76 121/60 112/49  Pulse: 64 60 72 67  Temp: 98.2 F (36.8 C) 98 F (36.7 C) 98.9 F (37.2 C) 98.8 F (37.1 C)  TempSrc: Oral Oral Oral Oral  Resp: 16 18 18 18   Height: 6\' 1"  (1.854 m)     Weight: 87.544 kg (193 lb)     SpO2: 99% 99% 99% 98%     Intake/Output Summary (Last 24 hours) at 10/13/12 1017 Last data filed at 10/13/12 0604  Gross per 24 hour  Intake    240 ml  Output   1000 ml  Net   -760 ml   Filed Weights   10/12/12 0500 10/12/12 0803  Weight: 88 kg (194 lb 0.1 oz) 87.544 kg (193 lb)    Exam:   General:  NAD, afebrile; denies CP or SOB  Cardiovascular: S1 and S2, no rubs, no gallops, no murmrus  Respiratory: CTA,   Abdomen: soft, NT, ND, positive BS  Musculoskeletal: no joint swelling or erythema; Right foot with mild tenderness when bearing weight, positive purulent drainage around recent amputation, mild erythema around wound also appreciated.  Neuro: non focal.  Data Reviewed: Basic Metabolic Panel:  Recent Labs Lab 10/11/12 2341 10/13/12 0645  NA 132* 133*  K 3.4* 3.5  CL 94* 97  CO2 25 24  GLUCOSE 201* 285*  BUN 8 8  CREATININE 0.89 0.77  CALCIUM 9.2 8.8   CBC:  Recent Labs Lab 10/11/12 2341 10/13/12 0645  WBC 8.3 6.2  HGB 12.4* 11.7*  HCT 36.3* 34.2*  MCV 79.8 80.5  PLT 327 303   Cardiac Enzymes:  Recent Labs Lab 10/12/12 0920 10/12/12 1416 10/12/12 1956  TROPONINI <0.30 <0.30 <0.30   CBG:  Recent Labs Lab 10/12/12 0750 10/12/12 1146 10/12/12 1715 10/12/12 2124 10/13/12 0717  GLUCAP 197* 169* 216* 156* 262*    Recent Results (from the past 240 hour(s))  CULTURE, BLOOD (ROUTINE X 2)     Status: None   Collection  Time    10/12/12  5:30 AM      Result Value Range Status   Specimen Description BLOOD RIGHT ARM   Final   Special Requests BOTTLES DRAWN AEROBIC AND ANAEROBIC 10CC EA   Final   Culture  Setup Time 10/12/2012 09:04   Final   Culture     Final   Value:        BLOOD CULTURE RECEIVED NO GROWTH TO DATE CULTURE WILL BE HELD FOR 5 DAYS BEFORE ISSUING A FINAL NEGATIVE REPORT   Report Status PENDING   Incomplete  CULTURE, BLOOD (ROUTINE X 2)     Status: None   Collection Time    10/12/12  5:35 AM      Result Value Range Status   Specimen  Description BLOOD RIGHT HAND   Final   Special Requests BOTTLES DRAWN AEROBIC ONLY [facility]   Final   Culture  Setup Time 10/12/2012 09:04   Final   Culture     Final   Value:        BLOOD CULTURE RECEIVED NO GROWTH TO DATE CULTURE WILL BE HELD FOR 5 DAYS BEFORE ISSUING A FINAL NEGATIVE REPORT   Report Status PENDING   Incomplete     Studies: Dg Chest 2 View  10/11/2012  *RADIOLOGY REPORT*  Clinical Data: Chest pain and pressure this afternoon.  Smoker.  CHEST - 2 VIEW  Comparison: 09/16/2012  Findings: The heart size and pulmonary vascularity are normal. The lungs appear clear and expanded without focal air space disease or consolidation. No blunting of the costophrenic angles.  No pneumothorax.  Mediastinal contours appear intact.  Tortuous aorta. Degenerative changes in the spine.  No significant change since previous study.  IMPRESSION: No evidence of active pulmonary disease.   Original Report Authenticated By: Burman Nieves, M.D.    Mr Foot Right W Wo Contrast  10/12/2012  *RADIOLOGY REPORT*  Clinical Data: Foot infection.  Status post amputation of the second ray at the level of the distal second metatarsal 09/18/2012. Also status post amputation of the first ray in 2013.  MRI OF THE RIGHT FOREFOOT WITHOUT AND WITH CONTRAST  Technique:  Multiplanar, multisequence MR imaging was performed both before and after administration of intravenous contrast.  Contrast: 18mL MULTIHANCE GADOBENATE DIMEGLUMINE 529 MG/ML IV SOLN  Comparison: MRI right foot 03/16/2011.  Plain films 10/12/2012 and 08/16/1988.  Findings: There is intense marrow edema and enhancement throughout the second metatarsal extending to the tarsometatarsal joint. Intense marrow edema and enhancement are also seen throughout the third metatarsal and toe. The distal right two thirds of the proximal phalanx of the second toe is not visualized and may have been resected or may be destroyed secondary to infection.  Edema and enhancement are also  seen in the third toe, worst in the proximal phalanx, in the head of the fourth metatarsal and in the fourth toe.  Bone marrow signal is otherwise unremarkable.  Soft tissues of the foot demonstrate diffuse edema and intense contrast enhancement consistent with cellulitis. There is a wound in the plantar soft tissues just distal to the surgical site of the second metatarsal which extends to the head of the third metatarsal. A small rim enhancing fluid collection is seen off the amputation site of the second metatarsal compatible with an abscess measuring 1.4 cm cranial-caudal by 0.4 cm transverse by 0.3 cm long.  IMPRESSION:  1.  Findings consistent with osteomyelitis in the remaining second metatarsal, throughout the third metatarsal and toe and  in the head of the fourth metatarsal and in the fourth toe. 2.  Intense cellulitis of the foot.  Surgical wound in the plantar soft tissues extends to the head of the third metatarsal.  There is a small abscess off the remaining distal second metatarsal.   Original Report Authenticated By: Holley Dexter, M.D.    Dg Foot Complete Right  10/12/2012  *RADIOLOGY REPORT*  Clinical Data: Right foot pain.  History of diabetes.  RIGHT FOOT COMPLETE - 3+ VIEW  Comparison: 03/08/2011  Findings: Since the previous study, there has been interval amputation of the right first ray at the tarsometatarsal joint level.  There has been amputation of the right second ray at the level of the distal metatarsal shaft.  There is callus formation around the site of amputation.  There is been amputation or resorption of the distal aspect of the proximal phalanx of the right third toe with apparent dislocation of the third metatarsal phalangeal joint.  Lateral view demonstrates a somewhat rocker bottom configuration of the foot.  There is evidence of erosion of the distal phalangeal tuft of the right fourth toe with focal soft tissue gas anteriorly.  Osteomyelitis here is not excluded.  No  acute fracture or subluxation is otherwise identified.  Vascular calcifications in the soft tissues.  No radiopaque soft tissue foreign bodies.  IMPRESSION: Previous amputations of the right first and second rays as discussed.  Resection or resorption of the distal aspect of the proximal phalanx of the right third toe.  Suggestion of erosion of the distal phalangeal tuft of the fourth toe with possible soft tissue gas.  Osteomyelitis is not excluded.   Original Report Authenticated By: Burman Nieves, M.D.     Scheduled Meds: . aspirin EC  325 mg Oral Daily  . ceFEPime (MAXIPIME) IV  1 g Intravenous Q8H  . ceFEPime (MAXIPIME) IV  2 g Intravenous Once  . clindamycin (CLEOCIN) IV  600 mg Intravenous Q8H  . gabapentin  600 mg Oral TID  . insulin aspart  0-9 Units Subcutaneous TID WC  . insulin detemir  8 Units Subcutaneous QHS  . pantoprazole  40 mg Oral Q1200  . pneumococcal 23 valent vaccine  0.5 mL Intramuscular Tomorrow-1000  . pyridOXINE  100 mg Oral Daily  . sodium chloride  3 mL Intravenous Q12H  . vancomycin  1,000 mg Intravenous Q8H   Continuous Infusions:   Principal Problem:   Cellulitis Active Problems:   DIABETES MELLITUS, TYPE II   Chest pain   Tobacco abuse    Time spent: >30 minutes    Lorrie Strauch  [facility] Pager [phone]. If 7PM-7AM, please contact night-coverage at [email or web address], password [facility] 10/13/2012, 10:17 AM  LOS: 1 day

## 2012-10-13 NOTE — Progress Notes (Signed)
CSW met with pt to give him the list of SNF's. Pt is agreeable for possible SNF placement in [facility].  Will continue to follow this pt. Full assessment to follow.   Sherald Barge, LCSW-A Clinical Social Worker [phone]

## 2012-10-13 NOTE — Progress Notes (Signed)
Patient ID: [name], male   DOB: May 16, 1941, 72 y.o.   MRN: [id number] Plan for right midfoot amputation tomorrow morning. N.p.o. after midnight. Sign permit.

## 2012-10-14 ENCOUNTER — Encounter (HOSPITAL_COMMUNITY): Payer: Self-pay | Admitting: Anesthesiology

## 2012-10-14 ENCOUNTER — Encounter (HOSPITAL_COMMUNITY)
Admission: EM | Disposition: A | Discharge status: Home / Self Care | Payer: Self-pay | Source: Home / Self Care | Attending: Internal Medicine

## 2012-10-14 ENCOUNTER — Inpatient Hospital Stay (HOSPITAL_COMMUNITY): Payer: Medicare HMO | Admitting: Anesthesiology

## 2012-10-14 HISTORY — PX: AMPUTATION: SHX166

## 2012-10-14 LAB — CBC
HCT: 32.5 % — ABNORMAL LOW (ref 39.0–52.0)
Hemoglobin: 10.8 g/dL — ABNORMAL LOW (ref 13.0–17.0)
MCH: 26.5 pg (ref 26.0–34.0)
MCHC: 33.2 g/dL (ref 30.0–36.0)
MCV: 79.9 fL (ref 78.0–100.0)
Platelets: 327 10*3/uL (ref 150–400)
RBC: 4.07 MIL/uL — ABNORMAL LOW (ref 4.22–5.81)
RDW: 14.4 % (ref 11.5–15.5)
WBC: 6.7 10*3/uL (ref 4.0–10.5)

## 2012-10-14 LAB — GLUCOSE, CAPILLARY
Glucose-Capillary: 171 mg/dL — ABNORMAL HIGH (ref 70–99)
Glucose-Capillary: 191 mg/dL — ABNORMAL HIGH (ref 70–99)
Glucose-Capillary: 215 mg/dL — ABNORMAL HIGH (ref 70–99)
Glucose-Capillary: 177 mg/dL — ABNORMAL HIGH (ref 70–99)
Glucose-Capillary: 137 mg/dL — ABNORMAL HIGH (ref 70–99)

## 2012-10-14 LAB — BASIC METABOLIC PANEL
BUN: 8 mg/dL (ref 6–23)
CO2: 25 mEq/L (ref 19–32)
Calcium: 8.7 mg/dL (ref 8.4–10.5)
Chloride: 102 mEq/L (ref 96–112)
Creatinine, Ser: 0.79 mg/dL (ref 0.50–1.35)
GFR calc Af Amer: >90 mL/min (ref >90)
GFR calc non Af Amer: 90 mL/min — ABNORMAL LOW (ref >90)
Glucose, Bld: 237 mg/dL — ABNORMAL HIGH (ref 70–99)
Potassium: 3.4 mEq/L — ABNORMAL LOW (ref 3.5–5.1)
Sodium: 136 mEq/L (ref 135–145)

## 2012-10-14 SURGERY — AMPUTATION, FOOT, PARTIAL
Anesthesia: General | Site: Foot | Laterality: Right | Wound class: Dirty or Infected

## 2012-10-14 MED ORDER — METOCLOPRAMIDE HCL 5 MG/ML IJ SOLN
5.0000 mg | Freq: Three times a day (TID) | INTRAMUSCULAR | Status: DC | PRN
  Filled 2012-10-14: qty 2

## 2012-10-14 MED ORDER — LIDOCAINE HCL (CARDIAC) 20 MG/ML IV SOLN
INTRAVENOUS | Status: DC | PRN
  Administered 2012-10-14: 80 mg via INTRAVENOUS

## 2012-10-14 MED ORDER — HYDROCODONE-ACETAMINOPHEN 5-325 MG PO TABS
1.0000 | ORAL_TABLET | ORAL | Status: DC | PRN
  Administered 2012-10-15 (×2): 1 via ORAL
  Filled 2012-10-14 (×2): qty 1

## 2012-10-14 MED ORDER — INSULIN GLARGINE 100 UNIT/ML ~~LOC~~ SOLN
10.0000 [IU] | Freq: Every day | SUBCUTANEOUS | Status: DC
  Administered 2012-10-14: 10 [IU] via SUBCUTANEOUS
  Filled 2012-10-14 (×2): qty 0.1

## 2012-10-14 MED ORDER — SODIUM CHLORIDE 0.9 % IV SOLN: INTRAVENOUS | Status: DC

## 2012-10-14 MED ORDER — 0.9 % SODIUM CHLORIDE (POUR BTL) OPTIME
TOPICAL | Status: DC | PRN
  Administered 2012-10-14: 1000 mL

## 2012-10-14 MED ORDER — METOCLOPRAMIDE HCL 5 MG PO TABS
5.0000 mg | ORAL_TABLET | Freq: Three times a day (TID) | ORAL | Status: DC | PRN
  Filled 2012-10-14: qty 2

## 2012-10-14 MED ORDER — MIDAZOLAM HCL 5 MG/5ML IJ SOLN
INTRAMUSCULAR | Status: DC | PRN
  Administered 2012-10-14 (×2): 1 mg via INTRAVENOUS

## 2012-10-14 MED ORDER — LACTATED RINGERS IV SOLN
INTRAVENOUS | Status: DC | PRN
  Administered 2012-10-14: 08:00:00 via INTRAVENOUS

## 2012-10-14 MED ORDER — ONDANSETRON HCL 4 MG PO TABS: 4.0000 mg | ORAL_TABLET | Freq: Four times a day (QID) | ORAL | Status: DC | PRN

## 2012-10-14 MED ORDER — FENTANYL CITRATE 0.05 MG/ML IJ SOLN
INTRAMUSCULAR | Status: DC | PRN
  Administered 2012-10-14 (×2): 50 ug via INTRAVENOUS

## 2012-10-14 MED ORDER — HYDROMORPHONE HCL PF 1 MG/ML IJ SOLN: 0.5000 mg | INTRAMUSCULAR | Status: DC | PRN

## 2012-10-14 MED ORDER — ONDANSETRON HCL 4 MG/2ML IJ SOLN: 4.0000 mg | Freq: Four times a day (QID) | INTRAMUSCULAR | Status: DC | PRN

## 2012-10-14 MED ORDER — HYDROMORPHONE HCL PF 1 MG/ML IJ SOLN: 0.2500 mg | INTRAMUSCULAR | Status: DC | PRN

## 2012-10-14 MED ORDER — OXYCODONE-ACETAMINOPHEN 5-325 MG PO TABS: 1.0000 | ORAL_TABLET | ORAL | Status: DC | PRN

## 2012-10-14 MED ORDER — INSULIN ASPART 100 UNIT/ML ~~LOC~~ SOLN
3.0000 [IU] | Freq: Three times a day (TID) | SUBCUTANEOUS | Status: DC
  Administered 2012-10-14 – 2012-10-15 (×3): 3 [IU] via SUBCUTANEOUS

## 2012-10-14 MED ORDER — ONDANSETRON HCL 4 MG/2ML IJ SOLN
INTRAMUSCULAR | Status: DC | PRN
  Administered 2012-10-14: 4 mg via INTRAVENOUS

## 2012-10-14 MED ORDER — PROPOFOL 10 MG/ML IV BOLUS
INTRAVENOUS | Status: DC | PRN
  Administered 2012-10-14: 125 mg via INTRAVENOUS

## 2012-10-14 MED ORDER — ONDANSETRON HCL 4 MG/2ML IJ SOLN: 4.0000 mg | Freq: Once | INTRAMUSCULAR | Status: AC | PRN

## 2012-10-14 MED ORDER — INSULIN ASPART 100 UNIT/ML ~~LOC~~ SOLN
0.0000 [IU] | Freq: Three times a day (TID) | SUBCUTANEOUS | Status: DC
  Administered 2012-10-14 – 2012-10-15 (×2): 3 [IU] via SUBCUTANEOUS

## 2012-10-14 MED ORDER — PHENYLEPHRINE HCL 10 MG/ML IJ SOLN
INTRAMUSCULAR | Status: DC | PRN
  Administered 2012-10-14 (×2): 40 ug via INTRAVENOUS
  Administered 2012-10-14: 80 ug via INTRAVENOUS

## 2012-10-14 SURGICAL SUPPLY — 40 items
BANDAGE GAUZE ELAST BULKY 4 IN (GAUZE/BANDAGES/DRESSINGS) ×3 IMPLANT
BLADE SAW SGTL HD 18.5X60.5X1. (BLADE) ×2 IMPLANT
BLADE SURG 10 STRL SS (BLADE) ×1 IMPLANT
BNDG COHESIVE 4X5 TAN STRL (GAUZE/BANDAGES/DRESSINGS) ×2 IMPLANT
CLOTH BEACON ORANGE TIMEOUT ST (SAFETY) ×2 IMPLANT
COVER SURGICAL LIGHT HANDLE (MISCELLANEOUS) ×2 IMPLANT
DRAPE U-SHAPE 47X51 STRL (DRAPES) ×2 IMPLANT
DRSG ADAPTIC 3X8 NADH LF (GAUZE/BANDAGES/DRESSINGS) ×2 IMPLANT
DRSG PAD ABDOMINAL 8X10 ST (GAUZE/BANDAGES/DRESSINGS) ×1 IMPLANT
DURAPREP 26ML APPLICATOR (WOUND CARE) ×2 IMPLANT
ELECT REM PT RETURN 9FT ADLT (ELECTROSURGICAL) ×2
ELECTRODE REM PT RTRN 9FT ADLT (ELECTROSURGICAL) ×1 IMPLANT
GLOVE BIO SURGEON STRL SZ8 (GLOVE) ×2 IMPLANT
GLOVE BIOGEL PI IND STRL 8 (GLOVE) ×1 IMPLANT
GLOVE BIOGEL PI IND STRL 9 (GLOVE) ×1 IMPLANT
GLOVE BIOGEL PI INDICATOR 8 (GLOVE) ×1
GLOVE BIOGEL PI INDICATOR 9 (GLOVE) ×1
GLOVE SURG ORTHO 9.0 STRL STRW (GLOVE) ×2 IMPLANT
GOWN PREVENTION PLUS XLARGE (GOWN DISPOSABLE) ×2 IMPLANT
GOWN SRG XL XLNG 56XLVL 4 (GOWN DISPOSABLE) ×1 IMPLANT
GOWN STRL NON-REIN XL XLG LVL4 (GOWN DISPOSABLE) ×2
KIT BASIN OR (CUSTOM PROCEDURE TRAY) ×2 IMPLANT
KIT ROOM TURNOVER OR (KITS) ×2 IMPLANT
MANIFOLD NEPTUNE II (INSTRUMENTS) ×2 IMPLANT
MARKER SKIN DUAL TIP RULER LAB (MISCELLANEOUS) ×1 IMPLANT
NS IRRIG 1000ML POUR BTL (IV SOLUTION) ×2 IMPLANT
PACK ORTHO EXTREMITY (CUSTOM PROCEDURE TRAY) ×2 IMPLANT
PAD ARMBOARD 7.5X6 YLW CONV (MISCELLANEOUS) ×4 IMPLANT
PAD CAST 4YDX4 CTTN HI CHSV (CAST SUPPLIES) ×1 IMPLANT
PADDING CAST COTTON 4X4 STRL (CAST SUPPLIES) ×2
SPONGE GAUZE 4X4 12PLY (GAUZE/BANDAGES/DRESSINGS) ×2 IMPLANT
SPONGE LAP 18X18 X RAY DECT (DISPOSABLE) ×2 IMPLANT
STAPLER VISISTAT 35W (STAPLE) ×2 IMPLANT
SUT ETHILON 2 0 PSLX (SUTURE) ×6 IMPLANT
SUT VIC AB 2-0 CTB1 (SUTURE) ×4 IMPLANT
TOWEL OR 17X24 6PK STRL BLUE (TOWEL DISPOSABLE) ×2 IMPLANT
TOWEL OR 17X26 10 PK STRL BLUE (TOWEL DISPOSABLE) ×2 IMPLANT
TUBE CONNECTING 12X1/4 (SUCTIONS) ×2 IMPLANT
WATER STERILE IRR 1000ML POUR (IV SOLUTION) ×2 IMPLANT
YANKAUER SUCT BULB TIP NO VENT (SUCTIONS) ×2 IMPLANT

## 2012-10-14 NOTE — Progress Notes (Signed)
Clinical Social Work Department BRIEF PSYCHOSOCIAL ASSESSMENT 10/14/2012  Patient:  [name], [name]     Account Number:  [id number]     Admit date:  10/11/2012  Clinical Social Worker:  Kirke Shaggy  Date/Time:  10/12/2012 09:00 AM  Referred by:  Physician  Date Referred:  10/11/2012 Referred for  SNF Placement   Other Referral:   Interview type:  Patient Other interview type:   CSW spoke with the nephew and brother of the pt about SNF placement.    PSYCHOSOCIAL DATA Living Status:  ALONE Admitted from facility:   Level of care:   Primary support name:   Primary support relationship to patient:  FAMILY Degree of support available:   Pt lives alone, but does have family that lives in the area.    CURRENT CONCERNS Current Concerns  Post-Acute Placement   Other Concerns:    SOCIAL WORK ASSESSMENT / PLAN CSW met with the pt to discuss SNF placement. Pt really wants to go home, but we discussed that the pt will need time to heal and get stronger with more PT/OT at a SNF.   Assessment/plan status:  No Further Intervention Required    Information/referral to community resources:   Gave the pt and his family members a list of the SNF's in the area.    PATIENT'S/FAMILY'S RESPONSE TO PLAN OF CARE: The pt isn't excited about going to a SNF, he would prefer to go home. CSW discussed the importance that the pt will need further nursing needs with his new amputation of his foot, and continued PT and OT. Pt said ok...   Sherald Barge, LCSW-A Clinical Social Worker [phone]

## 2012-10-14 NOTE — Progress Notes (Signed)
CSW received referral for SNF placement. CSW will meet with the pt and/or family to search for placement. FL2 is on the shadow chart in the [facility] which needs to be signed.    Sherald Barge, LCSW-A Clinical Social Worker [phone]

## 2012-10-14 NOTE — Care Management Note (Signed)
UR Completed Jawara Latorre Graves-Bigelow, RN,BSN [phone]  

## 2012-10-14 NOTE — Op Note (Signed)
OPERATIVE REPORT  DATE OF SURGERY: 10/14/2012  PATIENT:  [name],  72 y.o. male  PRE-OPERATIVE DIAGNOSIS:  Right Foot Abscess and Osteomyelitis   POST-OPERATIVE DIAGNOSIS:  Right Foot Abscess and Osteomyelitis   PROCEDURE:  Procedure(s): AMPUTATION FOOT transmetatarsal  SURGEON:  Surgeon(s): Nadara Mustard, MD  ANESTHESIA:   general  EBL:  Minimal ML  SPECIMEN:  Source of Specimen:  Right forefoot  TOURNIQUET:  * No tourniquets in log *  PROCEDURE DETAILS: Patient is a 72 year old gentleman with diabetes peripheral vascular disease and abscess of the right forefoot. Due to failure conservative treatment patient presents at this time for transmetatarsal amputation. Risks and benefits were discussed including persistent infection nonhealing of the wound need for additional surgery. Patient states he understands was pursued this time. Description of procedure patient brought to the operating room and underwent general anesthetic. After adequate levels of anesthesia were obtained patient's right lower extremity was prepped using DuraPrep draped into a sterile field. A fishmouth incision was made through the midfoot with a transmetatarsal amputation was performed with a oscillating saw. Hemostasis was obtained the incision and wound were cleansed with saline. There is no signs of deep abscess no signs of involvement of infection within the bony resection area. The incision was closed using 2-0 nylon the wound was covered Adaptic orthopedic sponges AB dressing Kerlix and Coban. Patient was extubated taken the PACU in stable condition.  PLAN OF CARE: Admit to inpatient   PATIENT DISPOSITION:  PACU - hemodynamically stable.   Nadara Mustard, MD 10/14/2012 8:58 AM

## 2012-10-14 NOTE — Care Management Note (Addendum)
    Page 1 of 1   10/15/2012     11:31:45 AM   CARE MANAGEMENT NOTE 10/15/2012  Patient:  [name], [name]   Account Number:  [id number]  Date Initiated:  10/14/2012  Documentation initiated by:  GRAVES-BIGELOW,Aadhya Bustamante  Subjective/Objective Assessment:   Pt admitted with Chest pain and right foot  with increased secretions. S/p Right Foot Transmetatarsal Amputation.     Action/Plan:   CSW following for possible SNF placement. CM will continue to monitor for disposition needs. PT to evaluate for recommendations.   Anticipated DC Date:  10/16/2012   Anticipated DC Plan:  SKILLED NURSING FACILITY      DC Planning Services  CM consult      Choice offered to / List presented to:             Status of service:  Completed, signed off Medicare Important Message given?   (If response is "NO", the following Medicare IM given date fields will be blank) Date Medicare IM given:   Date Additional Medicare IM given:    Discharge Disposition:  SKILLED NURSING FACILITY  Per UR Regulation:  Reviewed for med. necessity/level of care/duration of stay  If discussed at Long Length of Stay Meetings, dates discussed:    Comments:  10-15-12 1127 Tomi Bamberger, RN,BSN [phone] CM did discuss with pt about about glucometor once he gets home. Pt will find the cheapest one at [employer]- [employer] [facility] [employer] around 15[facility]. Pt states understanding and he says he has family that can pick that up for him once stable for home. No further needs from CM at this time.

## 2012-10-14 NOTE — H&P (Signed)
  Plan for right foot midfoot amputation today.

## 2012-10-14 NOTE — Preoperative (Signed)
Beta Blockers   Reason not to administer Beta Blockers:Not Applicable 

## 2012-10-14 NOTE — Progress Notes (Signed)
[facility] PROGRESS NOTE  [name] WUJ:[id number] DOB: April 18, 1941 DOA: 10/12/2012 PCP: [name], NP  Assessment/Plan:  1-Right foot osteomyelitis and abscess: patient MRI demonstrated osteomyelitis on his right foot.  -s/p[ surgery R foot 10/14/12 -continue antibiotics until deemed appropriate per Ortho [should not require further IV abx] -continue PRN analgesics  2-CP: no further chest discomfort; most likely secondary to GERD. -CE'z neg X3; no abnormalities on telemetry or EKG for acute ischemia and no wall motion abnormalities on 2-D echo.  3-DM: A1C 11.3; CBG's in the 171-237 range. Increased lantus to 10 units.  Added mealtime coverage 3 units reg insulin. Continue SSI.  4-GERD: continue PPI  5-Tobaco abuse: counseling provided. Nicotine patch ordered  6-Neuropathy: continue gabapentin.  7-Mild anemia-probably of chronic disease  DVT:SCD's   Code Status: Full code Family Communication: no family at bedside Disposition Plan: Will probably needs SNF in 1-2 days   Consultants:  Orthopedic service  Procedures:  Right foot MRI (IMPRESSION: 1. 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 2. Intense cellulitis of the foot. Surgical wound in the plantar soft tissues extends to the head of the third metatarsal. There is a small abscess off the remaining distal second metatarsal)   2-D echo (preserved EF, no wall motion abnormalities; findings consistent with grade 2 diastolic dysfunction)  Antibiotics:  Vanc, cefepime and clindamycin   HPI/Subjective: Afebrile, no CP or SOB.  Objective: Filed Vitals:   10/14/12 0915 10/14/12 0930 10/14/12 0945 10/14/12 1015  BP: 125/65 125/65 121/62 117/62  Pulse: 61 63 61 66  Temp:   97.5 F (36.4 C) 97.6 F (36.4 C)  TempSrc:      Resp:      Height:      Weight:      SpO2:  100% 100%  100%    Intake/Output Summary (Last 24 hours) at 10/14/12 1619 Last data filed at 10/14/12 1010  Gross per 24 hour  Intake    600 ml  Output    250 ml  Net    350 ml   Filed Weights   10/12/12 0500 10/12/12 0803  Weight: 88 kg (194 lb 0.1 oz) 87.544 kg (193 lb)    Exam:   General:  NAD, afebrile; denies CP or SOB  Cardiovascular: S1 and S2, no rubs, no gallops, no murmrus  Respiratory: CTA,   Abdomen: soft, NT, ND, positive BS  Musculoskeletal: no joint swelling or erythema; Right foot Lis-franc amputation noted  Neuro: non focal.  Data Reviewed: Basic Metabolic Panel:  Recent Labs Lab 10/11/12 2341 10/13/12 0645 10/14/12 0515  NA 132* 133* 136  K 3.4* 3.5 3.4*  CL 94* 97 102  CO2 25 24 25   GLUCOSE 201* 285* 237*  BUN 8 8 8   CREATININE 0.89 0.77 0.79  CALCIUM 9.2 8.8 8.7   CBC:  Recent Labs Lab 10/11/12 2341 10/13/12 0645 10/14/12 0515  WBC 8.3 6.2 6.7  HGB 12.4* 11.7* 10.8*  HCT 36.3* 34.2* 32.5*  MCV 79.8 80.5 79.9  PLT 327 303 327   Cardiac Enzymes:  Recent Labs Lab 10/12/12 0920 10/12/12 1416 10/12/12 1956  TROPONINI <0.30 <0.30 <0.30   CBG:  Recent Labs Lab 10/13/12 1628 10/13/12 2057 10/14/12 0813 10/14/12 0915 10/14/12 1208  GLUCAP 200* 244* 171* 191* 215*    Recent Results (from the past 240 hour(s))  CULTURE, BLOOD (ROUTINE X 2)  Status: None   Collection Time    10/12/12  5:30 AM      Result Value Range Status   Specimen Description BLOOD RIGHT ARM   Final   Special Requests BOTTLES DRAWN AEROBIC AND ANAEROBIC 10CC EA   Final   Culture  Setup Time 10/12/2012 09:04   Final   Culture     Final   Value:        BLOOD CULTURE RECEIVED NO GROWTH TO DATE CULTURE WILL BE HELD FOR 5 DAYS BEFORE ISSUING A FINAL NEGATIVE REPORT   Report Status PENDING   Incomplete  CULTURE, BLOOD (ROUTINE X 2)     Status: None   Collection Time    10/12/12  5:35 AM      Result Value Range Status   Specimen Description BLOOD  RIGHT HAND   Final   Special Requests BOTTLES DRAWN AEROBIC ONLY [facility]   Final   Culture  Setup Time 10/12/2012 09:04   Final   Culture     Final   Value:        BLOOD CULTURE RECEIVED NO GROWTH TO DATE CULTURE WILL BE HELD FOR 5 DAYS BEFORE ISSUING A FINAL NEGATIVE REPORT   Report Status PENDING   Incomplete  SURGICAL PCR SCREEN     Status: None   Collection Time    10/13/12 10:10 PM      Result Value Range Status   MRSA, PCR NEGATIVE  NEGATIVE Final   Staphylococcus aureus NEGATIVE  NEGATIVE Final   Comment:            The Xpert SA Assay (FDA     approved for NASAL specimens     in patients over 88 years of age),     is one component of     a comprehensive surveillance     program.  Test performance has     been validated by [employer] for patients greater     than or equal to 81 year old.     It is not intended     to diagnose infection nor to     guide or monitor treatment.     Studies: No results found.  Scheduled Meds: . aspirin EC  325 mg Oral Daily  . ceFEPime (MAXIPIME) IV  1 g Intravenous Q8H  . ceFEPime (MAXIPIME) IV  2 g Intravenous Once  . clindamycin (CLEOCIN) IV  600 mg Intravenous Q8H  . docusate sodium  50 mg Oral BID  . gabapentin  600 mg Oral TID  . insulin aspart  0-15 Units Subcutaneous TID WC  . insulin aspart  0-9 Units Subcutaneous TID WC  . insulin aspart  3 Units Subcutaneous TID WC  . insulin glargine  10 Units Subcutaneous QHS  . nicotine  14 mg Transdermal Daily  . pantoprazole  40 mg Oral Q1200  . polyethylene glycol  17 g Oral Daily  . pyridOXINE  100 mg Oral Daily  . sodium chloride  3 mL Intravenous Q12H  . vancomycin  1,250 mg Intravenous Q12H   Continuous Infusions: . sodium chloride 10 mL/hr at 10/14/12 1010    Principal Problem:   Cellulitis Active Problems:   DIABETES MELLITUS, TYPE II   Chest pain   Tobacco abuse    Time spent: >30 minutes    [name]  [facility] Pager [phone]. If  7PM-7AM, please contact night-coverage at [email or web address], password [email or web address] 10/14/2012, 4:19 PM  LOS: 2 days

## 2012-10-14 NOTE — Transfer of Care (Signed)
Immediate Anesthesia Transfer of Care Note  Patient: [name]  Procedure(s) Performed: Procedure(s) with comments: AMPUTATION FOOT (Right) - Right Foot Transmetatarsal Amputation  Patient Location: PACU  Anesthesia Type:General  Level of Consciousness: awake, alert  and oriented  Airway & Oxygen Therapy: Patient Spontanous Breathing and Patient connected to nasal cannula oxygen  Post-op Assessment: Report given to PACU RN and Post -op Vital signs reviewed and stable  Post vital signs: Reviewed and stable  Complications: No apparent anesthesia complications

## 2012-10-14 NOTE — Anesthesia Procedure Notes (Addendum)
Anesthesia Regional Block:  Ankle block  Pre-Anesthetic Checklist: ,, timeout performed, Correct Patient, Correct Site, Correct Laterality, Correct Procedure, Correct Position, site marked, Risks and benefits discussed,  Surgical consent,  Pre-op evaluation,  At surgeon's request and post-op pain management  Laterality: Right  Prep: Maximum Sterile Barrier Precautions used, chloraprep and alcohol swabs       Needles:  Injection technique: Single-shot      Needle Gauge: 25 and 25 G    Additional Needles: Ankle block Narrative:  Start time: 10/14/2012 8:05 AM End time: 10/14/2012 8:10 AM Injection made incrementally with aspirations every 5 mL.  Performed by: Personally  Anesthesiologist: Maren Beach MD  Additional Notes: Pt accepts procedure and risks. 40cc ( 20cc 2% Lidocaine w/ epi and 20cc 0.5% Ropivicaine ) Ant, Post. Tibial and Sural Nerves. Pt tolerated well.  GES   Procedure Name: LMA Insertion Date/Time: 10/14/2012 8:37 AM Performed by: Gayla Medicus Pre-anesthesia Checklist: Timeout performed, Patient identified, Emergency Drugs available, Patient being monitored and Suction available Patient Re-evaluated:Patient Re-evaluated prior to inductionOxygen Delivery Method: Circle system utilized Preoxygenation: Pre-oxygenation with 100% oxygen Intubation Type: IV induction Ventilation: Mask ventilation without difficulty LMA: LMA inserted LMA Size: 4.0 Number of attempts: 1 Placement Confirmation: positive ETCO2 and breath sounds checked- equal and bilateral Tube secured with: Tape Dental Injury: Teeth and Oropharynx as per pre-operative assessment

## 2012-10-14 NOTE — Progress Notes (Signed)
Physical Therapy Evaluation Patient Details Name: [name] MRN: [id number] DOB: [birth year] Today's Date: 10/14/2012 Time: 5284-1324 PT Time Calculation (min): 31 min  PT Assessment / Plan / Recommendation Clinical Impression  Pt is 72 yo male s/p right transmet amputation who is needing mod A to ambulate safely due to shaking with voluntary mvmt. Pt also somewhat impulsive with decreased safety awareness. Recommend SNF for rehab before home. PT to follow acutely to help pt mobilize safely and decrease burden of care.    PT Assessment  Patient needs continued PT services    Follow Up Recommendations  SNF;Supervision/Assistance - 24 hour    Does the patient have the potential to tolerate intense rehabilitation      Barriers to Discharge Decreased caregiver support lives alone    Equipment Recommendations  None recommended by PT    Recommendations for Other Services OT consult   Frequency Min 3X/week    Precautions / Restrictions Precautions Precautions: Fall Precaution Comments: pt shaking with voluntary motion and with restricted wt-bearing Restrictions Weight Bearing Restrictions: Yes RLE Weight Bearing: Non weight bearing Other Position/Activity Restrictions: no wt bearing status stated by physician, therefore kept pt NWB given amputation    Pertinent Vitals/Pain No c/o pain yet since surgery, foot still numb      Mobility  Bed Mobility Bed Mobility: Supine to Sit;Sitting - Scoot to Edge of Bed Supine to Sit: 6: Modified independent (Device/Increase time) Sitting - Scoot to Edge of Bed: 6: Modified independent (Device/Increase time) Transfers Transfers: Sit to Stand;Stand to Sit Sit to Stand: 4: Min assist;With upper extremity assist;From bed Stand to Sit: 4: Min assist;To chair/3-in-1;With upper extremity assist Details for Transfer Assistance: min A to steady and to unweight right LE, vc's to keep wt off right foot Ambulation/Gait Ambulation/Gait Assistance:  3: Mod assist Ambulation Distance (Feet): 3 Feet Assistive device: Rolling walker Ambulation/Gait Assistance Details: attempted hopping, pt took 2 small hops, needed mod A due to shaking, very unsteady Gait Pattern: Step-to pattern Stairs: No Wheelchair Mobility Wheelchair Mobility: No    Exercises     PT Diagnosis: Difficulty walking;Abnormality of gait  PT Problem List: Decreased strength;Decreased activity tolerance;Decreased balance;Decreased mobility;Decreased coordination;Decreased cognition;Decreased knowledge of use of DME;Decreased safety awareness;Decreased knowledge of precautions PT Treatment Interventions: DME instruction;Gait training;Functional mobility training;Therapeutic activities;Therapeutic exercise;Balance training;Cognitive remediation;Patient/family education   PT Goals Acute Rehab PT Goals PT Goal Formulation: With patient Time For Goal Achievement: 10/28/12 Potential to Achieve Goals: Good Pt will go Sit to Stand: with supervision PT Goal: Sit to Stand - Progress: Goal set today Pt will go Stand to Sit: with supervision PT Goal: Stand to Sit - Progress: Goal set today Pt will Ambulate: 1 - 15 feet;with min assist;with rolling walker PT Goal: Ambulate - Progress: Goal set today Pt will Perform Home Exercise Program: with supervision, verbal cues required/provided PT Goal: Perform Home Exercise Program - Progress: Goal set today  Visit Information  Last PT Received On: 10/14/12 Assistance Needed: +2 (for ambulation)    Subjective Data  Subjective: pt wants to go home and thinks he can manage Patient Stated Goal: return home   Prior Functioning  Home Living Lives With: Alone Available Help at Discharge: Family;Available PRN/intermittently Type of Home: House Home Access: Level entry Home Layout: One level Bathroom Shower/Tub: Engineer, manufacturing systems: Standard Bathroom Accessibility: Yes How Accessible: Accessible via walker Home Adaptive  Equipment: Walker - rolling;Bedside commode/3-in-1 Prior Function Level of Independence: Independent Driving: No Vocation: Retired Comments: pt's family takes him  to the store and he cooks for himself.  Communication Communication: No difficulties    Cognition  Cognition Arousal/Alertness: Awake/alert Behavior During Therapy: WFL for tasks assessed/performed Overall Cognitive Status: Within Functional Limits for tasks assessed Area of Impairment: Safety/judgement Safety/Judgement: Decreased awareness of safety;Decreased awareness of deficits General Comments: pt impulsive also in denial regarding deficits, playing down shaking that is occurring when he is up and putting right foot down on floor despite vc's    Extremity/Trunk Assessment Right Upper Extremity Assessment RUE ROM/Strength/Tone: [facility] for tasks assessed Left Upper Extremity Assessment LUE ROM/Strength/Tone: WFL for tasks assessed Right Lower Extremity Assessment RLE ROM/Strength/Tone: Deficits RLE ROM/Strength/Tone Deficits: hip and knee WFL, ankle/ foot NT due to surgery RLE Sensation: Deficits;History of peripheral neuropathy RLE Sensation Deficits: numbness from surgery today RLE Coordination: Deficits RLE Coordination Deficits: full body shaking with all voluntary mvmt Left Lower Extremity Assessment LLE ROM/Strength/Tone: WFL for tasks assessed LLE Sensation: History of peripheral neuropathy LLE Coordination: Deficits LLE Coordination Deficits: full body shaking with all voluntary mvmt, subsides with rest Trunk Assessment Trunk Assessment: Normal   Balance Balance Balance Assessed: Yes Dynamic Standing Balance Dynamic Standing - Balance Support: Bilateral upper extremity supported;During functional activity Dynamic Standing - Level of Assistance: 3: Mod assist  End of Session PT - End of Session Equipment Utilized During Treatment: Gait belt Activity Tolerance: Other (comment) (secondary to shaking, possibly  due to meds) Patient left: in chair;with call bell/phone within reach;with family/visitor present Nurse Communication: Mobility status  GP    Lyanne Co, PT  Acute Rehab Services  [phone]  Lyanne Co 10/14/2012, 3:45 PM

## 2012-10-14 NOTE — Progress Notes (Signed)
Clinical Social Work Department CLINICAL SOCIAL WORK PLACEMENT NOTE 10/14/2012  Patient:  [name], [name]  Account Number:  [id number] Admit date:  10/11/2012  Clinical Social Worker:  Sherald Barge, Theresia Majors  Date/time:  10/12/2012 04:27 PM  Clinical Social Work is seeking post-discharge placement for this patient at the following level of care:   SKILLED NURSING   (*CSW will update this form in Epic as items are completed)   10/12/2012  Patient/family provided with [facility] Department of Clinical Social Work's list of facilities offering this level of care within the geographic area requested by the patient (or if unable, by the patient's family).  10/12/2012  Patient/family informed of their freedom to choose among providers that offer the needed level of care, that participate in Medicare, Medicaid or managed care program needed by the patient, have an available bed and are willing to accept the patient.  10/12/2012  Patient/family informed of [facility]' ownership interest in [facility], as well as of the fact that they are under no obligation to receive care at this facility.  PASARR submitted to [employer] on 10/12/2012 PASARR number received from [employer] on 10/12/2012  FL2 transmitted to all facilities in geographic area requested by pt/family on  10/12/2012 FL2 transmitted to all facilities within larger geographic area on 10/12/2012  Patient informed that his/her managed care company has contracts with or will negotiate with  certain facilities, including the following:     Patient/family informed of bed offers received:   Patient chooses bed at  Physician recommends and patient chooses bed at    Patient to be transferred to  on   Patient to be transferred to facility by   Sherald Barge, LCSW-A Clinical Social Worker [phone]

## 2012-10-14 NOTE — Anesthesia Preprocedure Evaluation (Signed)
Anesthesia Evaluation  Patient identified by MRN, date of birth, ID band Patient awake    Reviewed: Allergy & Precautions, H&P , NPO status   Airway Mallampati: I TM Distance: >3 FB Neck ROM: full    Dental   Pulmonary          Cardiovascular hypertension, + Peripheral Vascular Disease Rhythm:regular Rate:Normal     Neuro/Psych    GI/Hepatic   Endo/Other  diabetes, Type 2  Renal/GU      Musculoskeletal   Abdominal   Peds  Hematology  (+) Blood dyscrasia, ,   Anesthesia Other Findings   Reproductive/Obstetrics                           Anesthesia Physical Anesthesia Plan  ASA: III  Anesthesia Plan: General   Post-op Pain Management:    Induction: Intravenous  Airway Management Planned: Oral ETT  Additional Equipment:   Intra-op Plan:   Post-operative Plan: Extubation in OR  Informed Consent: I have reviewed the patients History and Physical, chart, labs and discussed the procedure including the risks, benefits and alternatives for the proposed anesthesia with the patient or authorized representative who has indicated his/her understanding and acceptance.     Plan Discussed with: CRNA, Anesthesiologist and Surgeon  Anesthesia Plan Comments:         Anesthesia Quick Evaluation

## 2012-10-14 NOTE — Progress Notes (Signed)
Pt was sitting up in bed when I arrived, alert and visiting with his brother and nephew. Provided spiritual support and prayer during my visit. Pt and family members were thankful for prayer and visit and asked for a return visit. Marjory Lies Chaplain

## 2012-10-14 NOTE — Progress Notes (Signed)
Inpatient Diabetes Program Recommendations  AACE/ADA: New Consensus Statement on Inpatient Glycemic Control (2013)  Target Ranges:  Prepandial:   less than 140 mg/dL      Peak postprandial:   less than 180 mg/dL (1-2 hours)      Critically ill patients:  140 - 180 mg/dL    Results for [name], [name] (MRN [id number]) as of 10/14/2012 14:57  Ref. Range 10/14/2012 09:15 10/14/2012 12:08  Glucose-Capillary Latest Range: 70-99 mg/dL 409 (H) 811 (H)    Noted patient s/p amputation.  May need SNF at d/c.  Spoke to patient at length yesterday (see my note re: details of our conversation 04/29) about the importance of good glucose control.  Recommend the following insulin adjustments:  Inpatient Diabetes Program Recommendations Insulin - Basal: Please increase Lantus to 10 units QHS. Insulin - Meal Coverage: Please consider adding Novolog meal coverage- Novolog 3 units tid with meals.   Will follow. Ambrose Finland RN, MSN, CDE Diabetes Coordinator Inpatient Diabetes Program [phone]

## 2012-10-14 NOTE — Anesthesia Postprocedure Evaluation (Signed)
  Anesthesia Post-op Note  Patient: [name]  Procedure(s) Performed: Procedure(s) with comments: AMPUTATION FOOT (Right) - Right Foot Transmetatarsal Amputation  Patient Location: PACU  Anesthesia Type:General and GA combined with regional for post-op pain  Level of Consciousness: awake  Airway and Oxygen Therapy: Patient Spontanous Breathing  Post-op Pain: none  Post-op Assessment: Post-op Vital signs reviewed, Patient's Cardiovascular Status Stable, Respiratory Function Stable, Patent Airway, No signs of Nausea or vomiting and Pain level controlled  Post-op Vital Signs: stable  Complications: No apparent anesthesia complications

## 2012-10-15 LAB — CBC
HCT: 29.6 % — ABNORMAL LOW (ref 39.0–52.0)
Hemoglobin: 10 g/dL — ABNORMAL LOW (ref 13.0–17.0)
MCH: 27 pg (ref 26.0–34.0)
MCHC: 33.8 g/dL (ref 30.0–36.0)
MCV: 80 fL (ref 78.0–100.0)
Platelets: 317 10*3/uL (ref 150–400)
RBC: 3.7 MIL/uL — ABNORMAL LOW (ref 4.22–5.81)
RDW: 14.4 % (ref 11.5–15.5)
WBC: 7.1 10*3/uL (ref 4.0–10.5)

## 2012-10-15 LAB — BASIC METABOLIC PANEL
BUN: 7 mg/dL (ref 6–23)
CO2: 24 mEq/L (ref 19–32)
Calcium: 8.3 mg/dL — ABNORMAL LOW (ref 8.4–10.5)
Chloride: 100 mEq/L (ref 96–112)
Creatinine, Ser: 0.71 mg/dL (ref 0.50–1.35)
GFR calc Af Amer: >90 mL/min (ref >90)
GFR calc non Af Amer: >90 mL/min (ref >90)
Glucose, Bld: 269 mg/dL — ABNORMAL HIGH (ref 70–99)
Potassium: 3.5 mEq/L (ref 3.5–5.1)
Sodium: 135 mEq/L (ref 135–145)

## 2012-10-15 LAB — GLUCOSE, CAPILLARY
Glucose-Capillary: 182 mg/dL — ABNORMAL HIGH (ref 70–99)
Glucose-Capillary: 116 mg/dL — ABNORMAL HIGH (ref 70–99)

## 2012-10-15 LAB — VANCOMYCIN, TROUGH: Vancomycin Tr: 11.7 ug/mL (ref 10.0–20.0)

## 2012-10-15 MED ORDER — INSULIN ASPART 100 UNIT/ML ~~LOC~~ SOLN: 3.0000 [IU] | Freq: Three times a day (TID) | SUBCUTANEOUS | Status: DC

## 2012-10-15 MED ORDER — VANCOMYCIN HCL 10 G IV SOLR
1500.0000 mg | Freq: Two times a day (BID) | INTRAVENOUS | Status: DC
  Filled 2012-10-15 (×2): qty 1500

## 2012-10-15 MED ORDER — NICOTINE 14 MG/24HR TD PT24: 1.0000 | MEDICATED_PATCH | Freq: Every day | TRANSDERMAL | Status: DC

## 2012-10-15 MED ORDER — VANCOMYCIN HCL 10 G IV SOLR: 1500.0000 mg | INTRAVENOUS | Status: DC

## 2012-10-15 MED ORDER — INSULIN ASPART 100 UNIT/ML ~~LOC~~ SOLN: 0.0000 [IU] | Freq: Three times a day (TID) | SUBCUTANEOUS | Status: DC

## 2012-10-15 MED ORDER — ASPIRIN 325 MG PO TBEC: 325.0000 mg | DELAYED_RELEASE_TABLET | Freq: Every day | ORAL | Status: DC

## 2012-10-15 MED ORDER — OXYCODONE-ACETAMINOPHEN 5-325 MG PO TABS: 1.0000 | ORAL_TABLET | ORAL | Status: DC | PRN

## 2012-10-15 MED ORDER — PIPERACILLIN-TAZOBACTAM 3.375 G IVPB: 3.3750 g | Freq: Three times a day (TID) | INTRAVENOUS | Status: AC

## 2012-10-15 MED ORDER — INSULIN GLARGINE 100 UNIT/ML ~~LOC~~ SOLN: 10.0000 [IU] | Freq: Every day | SUBCUTANEOUS | Status: DC

## 2012-10-15 NOTE — Discharge Summary (Signed)
Physician Discharge Summary  [name] ZOX:[id number] DOB: 1940/06/25 DOA: 10/12/2012  PCP: Casey Cagey, NP  Admit date: 10/12/2012 Discharge date: 10/15/2012  Time spent: 35 minutes  Recommendations for Outpatient Follow-up:  1. Patient is to continue vancomycin and Zosyn until 10/18/2012. 2. Patient to be followed by Dr. [name] in 2 weeks and is to call his office for an appointment 3. Patient is to wear [name] until seen by him 4. Please monitor patient's blood sugars  5. Patient will need skilled nursing placement a stand shakiness and impulsiveness and 24 supervision  Discharge Diagnoses:  Principal Problem:   Cellulitis Active Problems:   DIABETES MELLITUS, TYPE II   Chest pain   Tobacco abuse   Discharge Condition: Good  Diet recommendation: Diabetic  Filed Weights   10/12/12 0500 10/12/12 0803 10/15/12 0506  Weight: 88 kg (194 lb 0.1 oz) 87.544 kg (193 lb) 93.441 kg (206 lb)    History of present illness:  72 Afro-American male with diabetes and tobacco abuse and recent right foot second toe of dictation presents to emergency room with some chest pain he does been noticing some secretions from his right foot at recent surgery. Because of the swelling and drainage was thought that he might have osteomyelitis. He was placed on vancomycin cefepime and clindamycin and orthopedics Dr. [name] was consulted regarding the same. 0 the patient is noncompliant on his insulin occasionally and does eat a lot of sweets. Patient ultimately underwent with iced surgery to the right foot with transmetatarsal amputation and he was transitioned from 3 antibiotics to vancomycin and Zosyn to complete 10/18/2012. His chest pain completely resolved and he ruled out for any acute coronary syndrome. He was up titrate down Lantus to 10 units on discharge and 3 times a day was started. I have discontinued his glipizide we will continue his metformin. 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 [name] of orthopedics  Discharge Exam: Filed Vitals:   10/14/12 1015 10/14/12 1400 10/14/12 2002 10/15/12 0506  BP: 117/62 126/75 133/73 113/57  Pulse: 66 64 107 75  Temp: 97.6 F (36.4 C) 97.4 F (36.3 C) 99.5 F (37.5 C) 98.7 F (37.1 C)  TempSrc:  Oral Oral Oral  Resp:  16    Height:      Weight:    93.441 kg (206 lb)  SpO2: 100% 100% 100% 96%    General: Alert pleasant oriented Cardiovascular: S1-S2 no rub or gallop Respiratory: Clinically clear  Discharge Instructions  Discharge Orders   Future Orders Complete By Expires     Call MD for:  difficulty breathing, headache or visual disturbances  As directed     Call MD for:  hives  As directed     Call MD for:  persistant dizziness or light-headedness  As directed     Call MD for:  persistant nausea and vomiting  As directed     Call MD for:  temperature >100.4  As directed     Diet - low sodium heart healthy  As directed     Increase activity slowly  As directed         Medication List    STOP taking these medications       glipiZIDE 5 MG tablet  Commonly known as:  GLUCOTROL     HYDROcodone-acetaminophen 10-325 MG per tablet  Commonly known as:  NORCO     ibuprofen 200 MG tablet  Commonly known  as:  ADVIL,MOTRIN     silver sulfADIAZINE 1 % cream  Commonly known as:  SILVADENE      TAKE these medications       acetaminophen 500 MG tablet  Commonly known as:  TYLENOL  Take 500-1,000 mg by mouth every 6 (six) hours as needed for pain.     aspirin 325 MG EC tablet  Take 1 tablet (325 mg total) by mouth daily.     gabapentin 300 MG capsule  Commonly known as:  NEURONTIN  Take 600 mg by mouth 3 (three) times daily.     insulin aspart 100 UNIT/ML injection  Commonly known as:  novoLOG  Inject 3 Units into the skin 3 (three) times daily with meals.     insulin aspart 100 UNIT/ML injection  Commonly known as:   novoLOG  Inject 0-15 Units into the skin 3 (three) times daily with meals.     insulin glargine 100 UNIT/ML injection  Commonly known as:  LANTUS  Inject 0.1 mLs (10 Units total) into the skin at bedtime.     metFORMIN 1000 MG tablet  Commonly known as:  GLUCOPHAGE  Take 1,000 mg by mouth 2 (two) times daily with a meal.     nicotine 14 mg/24hr patch  Commonly known as:  NICODERM CQ - dosed in mg/24 hours  Place 1 patch onto the skin daily.     oxyCODONE-acetaminophen 5-325 MG per tablet  Commonly known as:  PERCOCET/ROXICET  Take 1-2 tablets by mouth every 4 (four) hours as needed.     piperacillin-tazobactam 3.375 GM/50ML IVPB  Commonly known as:  ZOSYN  Inject 50 mLs (3.375 g total) into the vein every 8 (eight) hours.     pyridOXINE 100 MG tablet  Commonly known as:  VITAMIN B-6  Take 100 mg by mouth daily.     sodium chloride 0.9 % SOLN 500 mL with vancomycin 10 G SOLR 1,500 mg  Inject 1,500 mg into the vein daily.     VITAMIN B-12 PO  Take 1 tablet by mouth daily.           Follow-up Information   Follow up with DUDA,MARCUS V, MD In 2 weeks.   Contact information:   [name] [place] [address] [phone]        The results of significant diagnostics from this hospitalization (including imaging, microbiology, ancillary and laboratory) are listed below for reference.    Significant Diagnostic Studies: Dg Chest 2 View  10/11/2012  *RADIOLOGY REPORT*  Clinical Data: Chest pain and pressure this afternoon.  Smoker.  CHEST - 2 VIEW  Comparison: 09/16/2012  Findings: The heart size and pulmonary vascularity are normal. The lungs appear clear and expanded without focal air space disease or consolidation. No blunting of the costophrenic angles.  No pneumothorax.  Mediastinal contours appear intact.  Tortuous aorta. Degenerative changes in the spine.  No significant change since previous study.  IMPRESSION: No evidence of active pulmonary disease.    Original Report Authenticated By: Burman Nieves, M.D.    Dg Chest 2 View  09/16/2012  *RADIOLOGY REPORT*  Clinical Data: Preadmit foot surgery  CHEST - 2 VIEW  Comparison: 07/23/2011  Findings: Heart size upper normal.  Negative for heart failure. Lungs are clear without infiltrate or mass lesion.  No pleural effusion.  No change from prior study.  IMPRESSION: No acute cardiopulmonary abnormality.   Original Report Authenticated By: Janeece Riggers, M.D.    Mr Foot Right W Wo  Contrast  10/12/2012  *RADIOLOGY REPORT*  Clinical Data: Foot infection.  Status post amputation of the second ray at the level of the distal second metatarsal 09/18/2012. Also status post amputation of the first ray in 2013.  MRI OF THE RIGHT FOREFOOT WITHOUT AND WITH CONTRAST  Technique:  Multiplanar, multisequence MR imaging was performed both before and after administration of intravenous contrast.  Contrast: 18mL MULTIHANCE GADOBENATE DIMEGLUMINE 529 MG/ML IV SOLN  Comparison: MRI right foot 03/16/2011.  Plain films 10/12/2012 and 08/16/1988.  Findings: There is intense marrow edema and enhancement throughout the second metatarsal extending to the tarsometatarsal joint. Intense marrow edema and enhancement are also seen throughout the third metatarsal and toe. The distal right two thirds of the proximal phalanx of the second toe is not visualized and may have been resected or may be destroyed secondary to infection.  Edema and enhancement are also seen in the third toe, worst in the proximal phalanx, in the head of the fourth metatarsal and in the fourth toe.  Bone marrow signal is otherwise unremarkable.  Soft tissues of the foot demonstrate diffuse edema and intense contrast enhancement consistent with cellulitis. There is a wound in the plantar soft tissues just distal to the surgical site of the second metatarsal which extends to the head of the third metatarsal. A small rim enhancing fluid collection is seen off the amputation site  of the second metatarsal compatible with an abscess measuring 1.4 cm cranial-caudal by 0.4 cm transverse by 0.3 cm long.  IMPRESSION:  1.  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 2.  Intense cellulitis of the foot.  Surgical wound in the plantar soft tissues extends to the head of the third metatarsal.  There is a small abscess off the remaining distal second metatarsal.   Original Report Authenticated By: Holley Dexter, M.D.    Dg Foot Complete Right  10/12/2012  *RADIOLOGY REPORT*  Clinical Data: Right foot pain.  History of diabetes.  RIGHT FOOT COMPLETE - 3+ VIEW  Comparison: 03/08/2011  Findings: Since the previous study, there has been interval amputation of the right first ray at the tarsometatarsal joint level.  There has been amputation of the right second ray at the level of the distal metatarsal shaft.  There is callus formation around the site of amputation.  There is been amputation or resorption of the distal aspect of the proximal phalanx of the right third toe with apparent dislocation of the third metatarsal phalangeal joint.  Lateral view demonstrates a somewhat rocker bottom configuration of the foot.  There is evidence of erosion of the distal phalangeal tuft of the right fourth toe with focal soft tissue gas anteriorly.  Osteomyelitis here is not excluded.  No acute fracture or subluxation is otherwise identified.  Vascular calcifications in the soft tissues.  No radiopaque soft tissue foreign bodies.  IMPRESSION: Previous amputations of the right first and second rays as discussed.  Resection or resorption of the distal aspect of the proximal phalanx of the right third toe.  Suggestion of erosion of the distal phalangeal tuft of the fourth toe with possible soft tissue gas.  Osteomyelitis is not excluded.   Original Report Authenticated By: Burman Nieves, M.D.     Microbiology: Recent Results (from the past 240 hour(s))  CULTURE, BLOOD (ROUTINE X 2)     Status: None   Collection Time  10/12/12  5:30 AM      Result Value Range Status   Specimen Description BLOOD RIGHT ARM   Final   Special Requests BOTTLES DRAWN AEROBIC AND ANAEROBIC 10CC EA   Final   Culture  Setup Time 10/12/2012 09:04   Final   Culture     Final   Value:        BLOOD CULTURE RECEIVED NO GROWTH TO DATE CULTURE WILL BE HELD FOR 5 DAYS BEFORE ISSUING A FINAL NEGATIVE REPORT   Report Status PENDING   Incomplete  CULTURE, BLOOD (ROUTINE X 2)     Status: None   Collection Time    10/12/12  5:35 AM      Result Value Range Status   Specimen Description BLOOD RIGHT HAND   Final   Special Requests BOTTLES DRAWN AEROBIC ONLY [facility]   Final   Culture  Setup Time 10/12/2012 09:04   Final   Culture     Final   Value:        BLOOD CULTURE RECEIVED NO GROWTH TO DATE CULTURE WILL BE HELD FOR 5 DAYS BEFORE ISSUING A FINAL NEGATIVE REPORT   Report Status PENDING   Incomplete  SURGICAL PCR SCREEN     Status: None   Collection Time    10/13/12 10:10 PM      Result Value Range Status   MRSA, PCR NEGATIVE  NEGATIVE Final   Staphylococcus aureus NEGATIVE  NEGATIVE Final   Comment:            The Xpert SA Assay (FDA     approved for NASAL specimens     in patients over 19 years of age),     is one component of     a comprehensive surveillance     program.  Test performance has     been validated by [employer] for patients greater     than or equal to 48 year old.     It is not intended     to diagnose infection nor to     guide or monitor treatment.     Labs: Basic Metabolic Panel:  Recent Labs Lab 10/11/12 2341 10/13/12 0645 10/14/12 0515 10/15/12 0515  NA 132* 133* 136 135  K 3.4* 3.5 3.4* 3.5  CL 94* 97 102 100  CO2 25 24 25 24   GLUCOSE 201* 285* 237* 269*  BUN 8 8 8 7   CREATININE 0.89 0.77 0.79 0.71  CALCIUM 9.2 8.8 8.7 8.3*   Liver Function  Tests: No results found for this basename: AST, ALT, ALKPHOS, BILITOT, PROT, ALBUMIN,  in the last 168 hours No results found for this basename: LIPASE, AMYLASE,  in the last 168 hours No results found for this basename: AMMONIA,  in the last 168 hours CBC:  Recent Labs Lab 10/11/12 2341 10/13/12 0645 10/14/12 0515 10/15/12 0515  WBC 8.3 6.2 6.7 7.1  HGB 12.4* 11.7* 10.8* 10.0*  HCT 36.3* 34.2* 32.5* 29.6*  MCV 79.8 80.5 79.9 80.0  PLT 327 303 327 317   Cardiac Enzymes:  Recent Labs Lab 10/12/12 0920 10/12/12 1416 10/12/12 1956  TROPONINI <0.30 <0.30 <0.30   BNP: BNP (last 3 results) No results found for this basename: PROBNP,  in the last 8760 hours CBG:  Recent Labs Lab 10/14/12 0915 10/14/12 1208 10/14/12 1627 10/14/12 2004 10/15/12 0726  GLUCAP 191* 215* 177* 137* 182*       Signed:  Rhetta Mura  [facility]  10/15/2012, 10:01 AM

## 2012-10-15 NOTE — Progress Notes (Signed)
CSW told the pt that [facility] has offered a bed to him, but they are having to get the insurance authorization for him to go to their facility. [facility] is in the pt's insurance network. CSW sent [facility] all of the medical information that they are asking for so now we are just waiting on the ok for the pt to go to the SNF.   Sherald Barge, LCSW-A Clinical Social Worker [phone]

## 2012-10-15 NOTE — Progress Notes (Signed)
Patient ID: [name], male   DOB: [birth year], 72 y.o.   MRN: [id number] Right foot dressing clean dry and intact. Patient to begin physical therapy progressive ambulation nonweightbearing on the right. Postoperative shoe for gait training. Continue IV antibiotics for 3 days postoperatively. Okay for discharge to skilled nursing.

## 2012-10-15 NOTE — Progress Notes (Signed)
ANTIBIOTIC CONSULT NOTE - Follow up  Pharmacy Consult for Vancocin and Maxipime Indication: right foot osteomyelitis  No Known Allergies  Patient Measurements: Height: 6\' 1"  (185.4 cm) Weight: 206 lb (93.441 kg) IBW/kg (Calculated) : 79.9  Vital Signs: Temp: 98.7 F (37.1 C) (05/01 0506) Temp src: Oral (05/01 0506) BP: 113/57 mmHg (05/01 0506) Pulse Rate: 75 (05/01 0506)  Labs:  Recent Labs  10/13/12 0645 10/14/12 0515 10/15/12 0515  WBC 6.2 6.7 7.1  HGB 11.7* 10.8* 10.0*  PLT 303 327 317  CREATININE 0.77 0.79 0.71   Estimated Creatinine Clearance: 98.5 ml/min (by C-G formula based on Cr of 0.71).  Microbiology: Recent Results (from the past 720 hour(s))  SURGICAL PCR SCREEN     Status: None   Collection Time    09/16/12  1:18 PM      Result Value Range Status   MRSA, PCR NEGATIVE  NEGATIVE Final   Staphylococcus aureus NEGATIVE  NEGATIVE Final   Comment:            The Xpert SA Assay (FDA     approved for NASAL specimens     in patients over 73 years of age),     is one component of     a comprehensive surveillance     program.  Test performance has     been validated by [employer] for patients greater     than or equal to 12 year old.     It is not intended     to diagnose infection nor to     guide or monitor treatment.  CULTURE, BLOOD (ROUTINE X 2)     Status: None   Collection Time    10/12/12  5:30 AM      Result Value Range Status   Specimen Description BLOOD RIGHT ARM   Final   Special Requests BOTTLES DRAWN AEROBIC AND ANAEROBIC 10CC EA   Final   Culture  Setup Time 10/12/2012 09:04   Final   Culture     Final   Value:        BLOOD CULTURE RECEIVED NO GROWTH TO DATE CULTURE WILL BE HELD FOR 5 DAYS BEFORE ISSUING A FINAL NEGATIVE REPORT   Report Status PENDING   Incomplete  CULTURE, BLOOD (ROUTINE X 2)     Status: None   Collection Time    10/12/12  5:35 AM      Result Value Range Status   Specimen Description BLOOD RIGHT HAND   Final    Special Requests BOTTLES DRAWN AEROBIC ONLY [facility]   Final   Culture  Setup Time 10/12/2012 09:04   Final   Culture     Final   Value:        BLOOD CULTURE RECEIVED NO GROWTH TO DATE CULTURE WILL BE HELD FOR 5 DAYS BEFORE ISSUING A FINAL NEGATIVE REPORT   Report Status PENDING   Incomplete  SURGICAL PCR SCREEN     Status: None   Collection Time    10/13/12 10:10 PM      Result Value Range Status   MRSA, PCR NEGATIVE  NEGATIVE Final   Staphylococcus aureus NEGATIVE  NEGATIVE Final   Comment:            The Xpert SA Assay (FDA     approved for NASAL specimens     in patients over 47 years of age),     is one component of     a  comprehensive surveillance     program.  Test performance has     been validated by [facility] for patients greater     than or equal to 40 year old.     It is not intended     to diagnose infection nor to     guide or monitor treatment.    Medical History: Past Medical History  Diagnosis Date  . Osteomyelitis     Right great toe  . Hypertension   . Arthritis   . Peripheral vascular disease     diabetic  with osteomylitis  . High cholesterol   . Type II diabetes mellitus     Medications:  Prescriptions prior to admission  Medication Sig Dispense Refill  . acetaminophen (TYLENOL) 500 MG tablet Take 500-1,000 mg by mouth every 6 (six) hours as needed for pain.      . Cyanocobalamin (VITAMIN B-12 PO) Take 1 tablet by mouth daily.      Marland Kitchen gabapentin (NEURONTIN) 300 MG capsule Take 600 mg by mouth 3 (three) times daily.      Marland Kitchen glipiZIDE (GLUCOTROL) 5 MG tablet Take 5 mg by mouth daily.       Marland Kitchen HYDROcodone-acetaminophen (NORCO) 10-325 MG per tablet Take 1 tablet by mouth every 6 (six) hours as needed for pain.  60 tablet  0  . ibuprofen (ADVIL,MOTRIN) 200 MG tablet Take 400 mg by mouth every 6 (six) hours as needed for pain.      . metFORMIN (GLUCOPHAGE) 1000 MG tablet Take 1,000 mg by mouth 2 (two) times daily with a meal.       . pyridOXINE  (VITAMIN B-6) 100 MG tablet Take 100 mg by mouth daily.      . silver sulfADIAZINE (SILVADENE) 1 % cream Apply topically daily.       Scheduled:  . aspirin EC  325 mg Oral Daily  . ceFEPime (MAXIPIME) IV  1 g Intravenous Q8H  . ceFEPime (MAXIPIME) IV  2 g Intravenous Once  . clindamycin (CLEOCIN) IV  600 mg Intravenous Q8H  . docusate sodium  50 mg Oral BID  . gabapentin  600 mg Oral TID  . insulin aspart  0-15 Units Subcutaneous TID WC  . insulin aspart  3 Units Subcutaneous TID WC  . insulin glargine  10 Units Subcutaneous QHS  . nicotine  14 mg Transdermal Daily  . pantoprazole  40 mg Oral Q1200  . [EXPIRED] pneumococcal 23 valent vaccine  0.5 mL Intramuscular Tomorrow-1000  . polyethylene glycol  17 g Oral Daily  . pyridOXINE  100 mg Oral Daily  . sodium chloride  3 mL Intravenous Q12H  . vancomycin  1,250 mg Intravenous Q12H  . [DISCONTINUED] insulin aspart  0-9 Units Subcutaneous TID WC  . [DISCONTINUED] insulin glargine  8 Units Subcutaneous QHS    Assessment: 71y/o male patient came to ED to c/o HA and body pain but foot wound noted by EDMD to need attention, had second toe removed, has been draining and emits foul odor, and was diagnosed with right foot osteo. Now s/p midfoot amputation to remain on abx x 3 more days. Currently receiving day #3 vanc/cefepime/clindamycin. Obtained trough today =11.7, below goal. Will increase dose. Renal fxn remains stable, wbc wnl, afebrile, and cx ngtd.  Goal of Therapy:  Vancomycin trough level 15-20 mcg/ml  Plan:  Change vancomycin to  1500mg  IV q12h and continue cefepime 1g q8h with clinda.  Verlene Mayer, PharmD, BCPS Pager  [phone] 10/15/2012,9:17 AM

## 2012-10-15 NOTE — Progress Notes (Addendum)
Inpatient Diabetes Program Recommendations  AACE/ADA: New Consensus Statement on Inpatient Glycemic Control (2013)  Target Ranges:  Prepandial:   less than 140 mg/dL      Peak postprandial:   less than 180 mg/dL (1-2 hours)      Critically ill patients:  140 - 180 mg/dL   Fasting glucose this am higher than HS glucose last pm.  Inpatient Diabetes Program Recommendations Insulin - Basal: Please increase Lantus to 10 units QHS. Insulin - Meal Coverage: xxxx  added, thank you. Just noted increase in Lantus to 10 units already ordered, thank you.  Thank you, Lenor Coffin, RN, CNS, Diabetes Coordinator [phone])

## 2012-10-15 NOTE — Progress Notes (Signed)
Orthopedic Tech Progress Note Patient Details:  [name] Jul 28, 1940 [id number] Post op shoe fitted for patient with partial foot amputation. Bulky dressing on foot but shoe fitted to best approximate size for use.   Ortho Devices Type of Ortho Device: Postop shoe/boot Ortho Device/Splint Location: Right Ortho Device/Splint Interventions: Application   Asia R Thompson 10/15/2012, 12:55 PM

## 2012-10-16 LAB — GLUCOSE, CAPILLARY: Glucose-Capillary: 211 mg/dL — ABNORMAL HIGH (ref 70–99)

## 2012-10-18 LAB — CULTURE, BLOOD (ROUTINE X 2)
Culture: NO GROWTH
Culture: NO GROWTH

## 2012-10-19 ENCOUNTER — Encounter (HOSPITAL_COMMUNITY): Payer: Self-pay | Admitting: Orthopedic Surgery

## 2012-11-16 NOTE — ED Provider Notes (Signed)
History     CSN: [id number]  Arrival date & time 10/11/12  2050   First MD Initiated Contact with Patient 10/12/12 0206      Chief Complaint  Patient presents with  . Headache  . Chest Pain    (Consider location/radiation/quality/duration/timing/severity/associated sxs/prior treatment) Patient is a 72 y.o. male presenting with headaches and chest pain.  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 Patient denies any chest pains or headaches to me however he says she's been feeling unwell this weekend, generalized malaise, subjective fevers and chills, increased drainage from the right foot. Patient has not filled his antibiotic prescription has not been taking it status post amputation of the great toe and second toe of the right foot. He also notes a smell coming from the foot. Symptoms have been severe, constant, ongoing     Past Medical History  Diagnosis Date  . Osteomyelitis     Right great toe  . Hypertension   . Arthritis   . Peripheral vascular disease     diabetic  with osteomylitis  . High cholesterol   . Type II diabetes mellitus     Past Surgical History  Procedure Laterality Date  . Incision and drainage of wound Left 2006    "foot" (10/12/2012)  . Circumcision  2010  . Foot fracture surgery Left 1970's    "broke it playing football" (10/12/2012)  . Humerus fracture surgery w/ implant Right 1975>    "put a plate in it" (09/23/8117)  . Amputation  07/23/2011    Procedure: AMPUTATION DIGIT;  Surgeon: Nadara Mustard, MD;  Location: [facility] OR;  Service: Orthopedics;  Laterality: Right;  Right Great Toe Amputation  . Amputation Right 09/18/2012    Procedure: Right Foot 2nd Ray Amputation;  Surgeon: Nadara Mustard, MD;  Location: [facility] OR;  Service: Orthopedics;  Laterality: Right;  Right Foot 2nd Ray Amputation  .  Back surgery    . Lumbar disc surgery  2008  . Amputation Right 10/14/2012    Procedure: AMPUTATION FOOT;  Surgeon: Nadara Mustard, MD;  Location: [facility] OR;  Service: Orthopedics;  Laterality: Right;  Right Foot Transmetatarsal Amputation    Family History  Problem Relation Age of Onset  . Anesthesia problems Neg Hx   . Diabetes Mellitus II Other     History  Substance Use Topics  . Smoking status: Current Every Day Smoker -- 12 years    Types: Pipe  . Smokeless tobacco: Never Used     Comment: 10/12/2012 offered smoking cessation materials; pt declines  . Alcohol Use: No      Review of SystemsAt least 10pt or greater review of systems completed and are negative except where specified in the HPI.   Allergies  Review of patient's allergies indicates no known allergies.  Home Medications   Current Outpatient Rx  Name  Route  Sig  Dispense  Refill  . acetaminophen (TYLENOL) 500 MG tablet   Oral   Take 500-1,000 mg by mouth every 6 (six) hours as needed for pain.         . Cyanocobalamin (VITAMIN B-12 PO)   Oral   Take 1 tablet by mouth daily.         Marland Kitchen gabapentin (NEURONTIN) 300 MG capsule   Oral   Take 600 mg by mouth 3 (three) times daily.         Marland Kitchen  metFORMIN (GLUCOPHAGE) 1000 MG tablet   Oral   Take 1,000 mg by mouth 2 (two) times daily with a meal.          . pyridOXINE (VITAMIN B-6) 100 MG tablet   Oral   Take 100 mg by mouth daily.         Marland Kitchen aspirin EC 325 MG EC tablet   Oral   Take 1 tablet (325 mg total) by mouth daily.   30 tablet   0   . insulin aspart (NOVOLOG) 100 UNIT/ML injection   Subcutaneous   Inject 3 Units into the skin 3 (three) times daily with meals.   1 vial   0   . insulin aspart (NOVOLOG) 100 UNIT/ML injection   Subcutaneous   Inject 0-15 Units into the skin 3 (three) times daily with meals.   1 vial   0   . insulin glargine (LANTUS) 100 UNIT/ML injection   Subcutaneous   Inject 0.1 mLs (10 Units total) into the skin  at bedtime.   10 mL   0   . nicotine (NICODERM CQ - DOSED IN MG/24 HOURS) 14 mg/24hr patch   Transdermal   Place 1 patch onto the skin daily.   28 patch   0   . oxyCODONE-acetaminophen (PERCOCET/ROXICET) 5-325 MG per tablet   Oral   Take 1-2 tablets by mouth every 4 (four) hours as needed.   30 tablet   0   . sodium chloride 0.9 % SOLN 500 mL with vancomycin 10 G SOLR 1,500 mg   Intravenous   Inject 1,500 mg into the vein daily.   3 Bottle   0     BP 122/64  Pulse 77  Temp(Src) 99.7 F (37.6 C) (Oral)  Resp 17  Ht 6\' 1"  (1.854 m)  Wt 206 lb (93.441 kg)  BMI 27.18 kg/m2  SpO2 100%  Physical Exam  Nursing notes reviewed.  Electronic medical record reviewed. VITAL SIGNS:   Filed Vitals:   10/14/12 1400 10/14/12 2002 10/15/12 0506 10/15/12 1400  BP: 126/75 133/73 113/57 122/64  Pulse: 64 107 75 77  Temp: 97.4 F (36.3 C) 99.5 F (37.5 C) 98.7 F (37.1 C) 99.7 F (37.6 C)  TempSrc: Oral Oral Oral   Resp: 16   17  Height:      Weight:   206 lb (93.441 kg)   SpO2: 100% 100% 96% 100%   CONSTITUTIONAL: Awake, oriented, appears non-toxic HENT: Atraumatic, normocephalic, oral mucosa pink and moist, airway patent. Nares patent without drainage. External ears normal. EYES: Conjunctiva clear, EOMI, PERRLA NECK: Trachea midline, non-tender, supple CARDIOVASCULAR: Normal heart rate, Normal rhythm, No murmurs, rubs, gallops PULMONARY/CHEST: Clear to auscultation, no rhonchi, wheezes, or rales. Symmetrical breath sounds. Non-tender. ABDOMINAL: Non-distended, soft, non-tender - no rebound or guarding.  BS normal. NEUROLOGIC: Non-focal, moving all four extremities, no gross sensory or motor deficits. EXTREMITIES: No clubbing, cyanosis, or edema. Amputated first and second toes of right foot with active drainage and foul smell SKIN: Warm, Dry, No erythema, No rash  ED Course  Procedures (including critical care time)  Labs Reviewed  CBC - Abnormal; Notable for the  following:    Hemoglobin 12.4 (*)    HCT 36.3 (*)    All other components within normal limits  BASIC METABOLIC PANEL - Abnormal; Notable for the following:    Sodium 132 (*)    Potassium 3.4 (*)    Chloride 94 (*)    Glucose, Bld 201 (*)  GFR calc non Af Amer 85 (*)    All other components within normal limits  D-DIMER, QUANTITATIVE - Abnormal; Notable for the following:    D-Dimer, Quant 1.23 (*)    All other components within normal limits  HEMOGLOBIN A1C - Abnormal; Notable for the following:    Hemoglobin A1C 11.3 (*)    Mean Plasma Glucose 278 (*)    All other components within normal limits  GLUCOSE, CAPILLARY - Abnormal; Notable for the following:    Glucose-Capillary 197 (*)    All other components within normal limits  SEDIMENTATION RATE - Abnormal; Notable for the following:    Sed Rate 88 (*)    All other components within normal limits  GLUCOSE, CAPILLARY - Abnormal; Notable for the following:    Glucose-Capillary 169 (*)    All other components within normal limits  BASIC METABOLIC PANEL - Abnormal; Notable for the following:    Sodium 133 (*)    Glucose, Bld 285 (*)    All other components within normal limits  CBC - Abnormal; Notable for the following:    Hemoglobin 11.7 (*)    HCT 34.2 (*)    All other components within normal limits  GLUCOSE, CAPILLARY - Abnormal; Notable for the following:    Glucose-Capillary 216 (*)    All other components within normal limits  LIPID PANEL - Abnormal; Notable for the following:    HDL 20 (*)    LDL Cholesterol 118 (*)    All other components within normal limits  GLUCOSE, CAPILLARY - Abnormal; Notable for the following:    Glucose-Capillary 156 (*)    All other components within normal limits  GLUCOSE, CAPILLARY - Abnormal; Notable for the following:    Glucose-Capillary 262 (*)    All other components within normal limits  GLUCOSE, CAPILLARY - Abnormal; Notable for the following:    Glucose-Capillary 224 (*)     All other components within normal limits  GLUCOSE, CAPILLARY - Abnormal; Notable for the following:    Glucose-Capillary 200 (*)    All other components within normal limits  CBC - Abnormal; Notable for the following:    RBC 4.07 (*)    Hemoglobin 10.8 (*)    HCT 32.5 (*)    All other components within normal limits  BASIC METABOLIC PANEL - Abnormal; Notable for the following:    Potassium 3.4 (*)    Glucose, Bld 237 (*)    GFR calc non Af Amer 90 (*)    All other components within normal limits  GLUCOSE, CAPILLARY - Abnormal; Notable for the following:    Glucose-Capillary 244 (*)    All other components within normal limits  GLUCOSE, CAPILLARY - Abnormal; Notable for the following:    Glucose-Capillary 171 (*)    All other components within normal limits  GLUCOSE, CAPILLARY - Abnormal; Notable for the following:    Glucose-Capillary 191 (*)    All other components within normal limits  GLUCOSE, CAPILLARY - Abnormal; Notable for the following:    Glucose-Capillary 215 (*)    All other components within normal limits  GLUCOSE, CAPILLARY - Abnormal; Notable for the following:    Glucose-Capillary 177 (*)    All other components within normal limits  BASIC METABOLIC PANEL - Abnormal; Notable for the following:    Glucose, Bld 269 (*)    Calcium 8.3 (*)    All other components within normal limits  CBC - Abnormal; Notable for the following:  RBC 3.70 (*)    Hemoglobin 10.0 (*)    HCT 29.6 (*)    All other components within normal limits  GLUCOSE, CAPILLARY - Abnormal; Notable for the following:    Glucose-Capillary 137 (*)    All other components within normal limits  GLUCOSE, CAPILLARY - Abnormal; Notable for the following:    Glucose-Capillary 182 (*)    All other components within normal limits  GLUCOSE, CAPILLARY - Abnormal; Notable for the following:    Glucose-Capillary 116 (*)    All other components within normal limits  GLUCOSE, CAPILLARY - Abnormal; Notable  for the following:    Glucose-Capillary 211 (*)    All other components within normal limits  CULTURE, BLOOD (ROUTINE X 2)  CULTURE, BLOOD (ROUTINE X 2)  SURGICAL PCR SCREEN  TROPONIN I  TROPONIN I  TROPONIN I  VANCOMYCIN, TROUGH  POCT I-STAT TROPONIN I  SURGICAL PATHOLOGY   No results found.   1. Type 2 diabetes mellitus with diabetic foot infection   2. Cellulitis   3. Chest pain   4. Tobacco abuse   5. Type II or unspecified type diabetes mellitus without mention of complication, not stated as uncontrolled   6. Diabetic foot ulcer   7. GERD (gastroesophageal reflux disease)   8. Osteomyelitis of right foot       MDM  Patient to be admitted for worsening infection of right foot, patient has not been taking his antibiotics as indicated appears to be reinfected. He is not toxic in appearance, not septic. Discussed with Dr. Toniann Fail for admission        Jones Skene, MD 11/16/12 [phone]

## 2013-01-09 ENCOUNTER — Encounter (HOSPITAL_COMMUNITY): Payer: Self-pay | Admitting: *Deleted

## 2013-01-09 ENCOUNTER — Emergency Department (HOSPITAL_COMMUNITY)
Admission: EM | Admit: 2013-01-09 | Discharge: 2013-01-10 | Disposition: A | Discharge status: Home / Self Care | Payer: Medicare HMO | Attending: Emergency Medicine | Admitting: Emergency Medicine

## 2013-01-09 DIAGNOSIS — Z79899 Other long term (current) drug therapy: Secondary | ICD-10-CM | POA: Insufficient documentation

## 2013-01-09 DIAGNOSIS — I10 Essential (primary) hypertension: Secondary | ICD-10-CM | POA: Insufficient documentation

## 2013-01-09 DIAGNOSIS — M129 Arthropathy, unspecified: Secondary | ICD-10-CM | POA: Insufficient documentation

## 2013-01-09 DIAGNOSIS — I739 Peripheral vascular disease, unspecified: Secondary | ICD-10-CM | POA: Insufficient documentation

## 2013-01-09 DIAGNOSIS — E7800 Pure hypercholesterolemia, unspecified: Secondary | ICD-10-CM | POA: Insufficient documentation

## 2013-01-09 DIAGNOSIS — F17200 Nicotine dependence, unspecified, uncomplicated: Secondary | ICD-10-CM | POA: Insufficient documentation

## 2013-01-09 DIAGNOSIS — K5900 Constipation, unspecified: Secondary | ICD-10-CM

## 2013-01-09 DIAGNOSIS — R141 Gas pain: Secondary | ICD-10-CM | POA: Insufficient documentation

## 2013-01-09 DIAGNOSIS — R142 Eructation: Secondary | ICD-10-CM | POA: Insufficient documentation

## 2013-01-09 DIAGNOSIS — R1084 Generalized abdominal pain: Secondary | ICD-10-CM | POA: Insufficient documentation

## 2013-01-09 DIAGNOSIS — E119 Type 2 diabetes mellitus without complications: Secondary | ICD-10-CM | POA: Insufficient documentation

## 2013-01-09 NOTE — ED Notes (Signed)
Pt reports not having a bowel movement since this past Wednesday. Reports taking a correctol around 1pm this afternoon albeit success. Pt has +BS x 4 quadrants. Pt rates mid abdominal pain 3/10 at this time. Denies nausea and vomiting with abdominal pain.

## 2013-01-09 NOTE — ED Notes (Signed)
Pt arrived from via [facility] c/o no BM x 2 days.

## 2013-01-10 ENCOUNTER — Emergency Department (HOSPITAL_COMMUNITY): Payer: Medicare HMO

## 2013-01-10 MED ORDER — FLEET ENEMA 7-19 GM/118ML RE ENEM
1.0000 | ENEMA | Freq: Once | RECTAL | Status: AC
  Administered 2013-01-10: 1 via RECTAL
  Filled 2013-01-10: qty 1

## 2013-01-10 MED ORDER — POLYETHYLENE GLYCOL 3350 17 G PO PACK: 17.0000 g | PACK | Freq: Every day | ORAL | Status: DC

## 2013-01-10 MED ORDER — MAGNESIUM CITRATE PO SOLN
1.0000 | Freq: Once | ORAL | Status: AC
  Administered 2013-01-10: 1 via ORAL
  Filled 2013-01-10: qty 296

## 2013-01-10 NOTE — ED Notes (Signed)
Pt requesting to go home to "use my own toilet for a big bowel movement".  States he has had one small liquid BM in [facility].Marland Kitchen Active bowel sounds. Denies pain. "I feel a lot better".

## 2013-01-10 NOTE — ED Provider Notes (Signed)
CSN: [id number]     Arrival date & time 01/09/13  2236 History     First MD Initiated Contact with Patient 01/09/13 2329     Chief Complaint  Patient presents with  . Constipation   (Consider location/radiation/quality/duration/timing/severity/associated sxs/prior Treatment) HPI 72 yo male presents to the ER by EMS from home with complaint of constipation for 3 days, bloating.  Pt reports eating large amount of spaghetti, "I think I made a hog of myself".  Pt took 2 correctol today around 1 pm without improvement.  No prior abdominal surgeries, no h/o obstruction, no prior problems with constipation.  Past Medical History  Diagnosis Date  . Osteomyelitis     Right great toe  . Hypertension   . Arthritis   . Peripheral vascular disease     diabetic  with osteomylitis  . High cholesterol   . Type II diabetes mellitus    Past Surgical History  Procedure Laterality Date  . Incision and drainage of wound Left 2006    "foot" (10/12/2012)  . Circumcision  2010  . Foot fracture surgery Left 1970's    "broke it playing football" (10/12/2012)  . Humerus fracture surgery w/ implant Right 1975>    "put a plate in it" (09/23/8117)  . Amputation  07/23/2011    Procedure: AMPUTATION DIGIT;  Surgeon: Nadara Mustard, MD;  Location: [facility] OR;  Service: Orthopedics;  Laterality: Right;  Right Great Toe Amputation  . Amputation Right 09/18/2012    Procedure: Right Foot 2nd Ray Amputation;  Surgeon: Nadara Mustard, MD;  Location: [facility] OR;  Service: Orthopedics;  Laterality: Right;  Right Foot 2nd Ray Amputation  . Back surgery    . Lumbar disc surgery  2008  . Amputation Right 10/14/2012    Procedure: AMPUTATION FOOT;  Surgeon: Nadara Mustard, MD;  Location: [facility] OR;  Service: Orthopedics;  Laterality: Right;  Right Foot Transmetatarsal Amputation   Family History  Problem Relation Age of Onset  . Anesthesia problems Neg Hx   . Diabetes Mellitus II Other    History  Substance Use Topics  . Smoking status:  Current Every Day Smoker -- 12 years    Types: Pipe  . Smokeless tobacco: Never Used     Comment: 10/12/2012 offered smoking cessation materials; pt declines  . Alcohol Use: No    Review of Systems  All other systems reviewed and are negative.    Allergies  Review of patient's allergies indicates no known allergies.  Home Medications   Current Outpatient Rx  Name  Route  Sig  Dispense  Refill  . Cyanocobalamin (VITAMIN B-12 PO)   Oral   Take 1 tablet by mouth daily.         Marland Kitchen glipiZIDE (GLUCOTROL XL) 5 MG 24 hr tablet   Oral   Take 5 mg by mouth daily.         . metFORMIN (GLUCOPHAGE) 1000 MG tablet   Oral   Take 1,000 mg by mouth daily with breakfast.          . pyridOXINE (VITAMIN B-6) 100 MG tablet   Oral   Take 100 mg by mouth daily.         Marland Kitchen oxyCODONE-acetaminophen (PERCOCET/ROXICET) 5-325 MG per tablet   Oral   Take 1-2 tablets by mouth every 4 (four) hours as needed.   30 tablet   0    BP 141/76  Pulse 65  Temp(Src) 98.1 F (36.7 C) (Oral)  Resp 18  SpO2 100% Physical Exam  Nursing note and vitals reviewed. Constitutional: He is oriented to person, place, and time. He appears well-developed and well-nourished.  HENT:  Head: Normocephalic and atraumatic.  Nose: Nose normal.  Mouth/Throat: Oropharynx is clear and moist.  Eyes: Conjunctivae and EOM are normal. Pupils are equal, round, and reactive to light.  Neck: Normal range of motion. Neck supple. No JVD present. No tracheal deviation present. No thyromegaly present.  Cardiovascular: Normal rate, regular rhythm, normal heart sounds and intact distal pulses.  Exam reveals no gallop and no friction rub.   No murmur heard. Pulmonary/Chest: Effort normal and breath sounds normal. No stridor. No respiratory distress. He has no wheezes. He has no rales. He exhibits no tenderness.  Abdominal: Soft. Bowel sounds are normal. He exhibits no distension and no mass. There is tenderness (very mild diffuse  abd pain). There is no rebound and no guarding.  Genitourinary: Rectum normal. Guaiac negative stool.  Musculoskeletal: Normal range of motion. He exhibits no edema and no tenderness.  Lymphadenopathy:    He has no cervical adenopathy.  Neurological: He is alert and oriented to person, place, and time. He exhibits normal muscle tone. Coordination normal.  Skin: Skin is warm and dry. No rash noted. No erythema. No pallor.  Psychiatric: He has a normal mood and affect. His behavior is normal. Judgment and thought content normal.    ED Course   Procedures (including critical care time)  Labs Reviewed - No data to display Dg Abd 1 View  01/10/2013   *RADIOLOGY REPORT*  Clinical Data: Constipation, tachycardia, nausea.  ABDOMEN - 1 VIEW  Comparison: MRI lumbar spine 09/01/2009.  Lumbar spine radiographs 07/31/2009  Findings: The scattered gas and stool in the colon.  No large or small bowel distension.  No radiopaque stones.  Calcified phleboliths in the pelvis.  Degenerative changes in the lumbar spine and hips.  IMPRESSION: Nonobstructive bowel gas pattern.  Stool filled colon.   Original Report Authenticated By: Burman Nieves, M.D.   1. Constipation     MDM  72 yo male with complaint of constipation.  Mild abd pain on exam, rectal exam without stool in vault.  Will check KUB.  If no signs of ileus or obstruction will treat with mag citrate and fleets enema.  Olivia Mackie, MD 01/10/13 [phone]

## 2013-03-10 ENCOUNTER — Encounter (HOSPITAL_BASED_OUTPATIENT_CLINIC_OR_DEPARTMENT_OTHER): Payer: Medicare HMO | Attending: General Surgery

## 2013-03-10 DIAGNOSIS — E1169 Type 2 diabetes mellitus with other specified complication: Secondary | ICD-10-CM | POA: Insufficient documentation

## 2013-03-10 DIAGNOSIS — S98119A Complete traumatic amputation of unspecified great toe, initial encounter: Secondary | ICD-10-CM | POA: Insufficient documentation

## 2013-03-10 DIAGNOSIS — I10 Essential (primary) hypertension: Secondary | ICD-10-CM | POA: Insufficient documentation

## 2013-03-10 DIAGNOSIS — L97509 Non-pressure chronic ulcer of other part of unspecified foot with unspecified severity: Secondary | ICD-10-CM | POA: Insufficient documentation

## 2013-03-10 LAB — GLUCOSE, CAPILLARY: Glucose-Capillary: 118 mg/dL — ABNORMAL HIGH (ref 70–99)

## 2013-03-11 NOTE — Progress Notes (Signed)
[facility]  NAME:  WIRT, HEMMERICH NO.:  [id number]  MEDICAL RECORD NO.:  [id number]      DATE OF BIRTH:  March 27, 1941  PHYSICIAN:  Ardath Sax, M.D.           VISIT DATE:                                  OFFICE VISIT   [name] is a 72 year old African American male who has had great problems with diabetic foot ulcers in the past.  In fact, he just healed one out on his left foot in March and he has a recurrence of this diabetic foot ulcer on the plantar aspect of his foot.  It is about 0.5 cm in diameter and I would classify this as a Wagner 2.  I cleaned it out today and we are going to treat it with silver alginate, we gave him some to put on and change the dressing every day.  Today, when he came in for his visit, he was found to have a blood sugar of 118, his temperature was 98, pulse 62, respirations 16, blood pressure of 161/84. He has been a type 2 diabetic for many years, and he has been taking care of himself very well.  His medicines include gabapentin, metformin, glipizide, and vitamins.  He has had a right great toe amputated in the past and he has had a right transmetatarsal amputation in the past.  He is known to have peripheral vascular disease, but I do feel a posterior tibial pulse.  We will treat this with silver alginate and see him in a week.  So his diagnoses is type 2 diabetes, plantar diabetic foot ulcer, left foot; history of hypertension; history of amputation of his great toe.     Ardath Sax, M.D.     PP/MEDQ  D:  03/10/2013  T:  03/11/2013  Job:  [id number]

## 2013-03-17 ENCOUNTER — Encounter (HOSPITAL_BASED_OUTPATIENT_CLINIC_OR_DEPARTMENT_OTHER): Payer: Medicare HMO | Attending: General Surgery

## 2013-03-17 DIAGNOSIS — S98119A Complete traumatic amputation of unspecified great toe, initial encounter: Secondary | ICD-10-CM | POA: Insufficient documentation

## 2013-03-17 DIAGNOSIS — E1169 Type 2 diabetes mellitus with other specified complication: Secondary | ICD-10-CM | POA: Insufficient documentation

## 2013-03-17 DIAGNOSIS — I10 Essential (primary) hypertension: Secondary | ICD-10-CM | POA: Insufficient documentation

## 2013-03-17 DIAGNOSIS — L97509 Non-pressure chronic ulcer of other part of unspecified foot with unspecified severity: Secondary | ICD-10-CM | POA: Insufficient documentation

## 2013-03-24 LAB — GLUCOSE, CAPILLARY: Glucose-Capillary: 122 mg/dL — ABNORMAL HIGH (ref 70–99)

## 2013-03-31 LAB — GLUCOSE, CAPILLARY: Glucose-Capillary: 125 mg/dL — ABNORMAL HIGH (ref 70–99)

## 2013-04-07 LAB — GLUCOSE, CAPILLARY: Glucose-Capillary: 149 mg/dL — ABNORMAL HIGH (ref 70–99)

## 2013-04-21 ENCOUNTER — Encounter (HOSPITAL_BASED_OUTPATIENT_CLINIC_OR_DEPARTMENT_OTHER): Payer: Medicare HMO | Attending: General Surgery

## 2013-04-21 DIAGNOSIS — L84 Corns and callosities: Secondary | ICD-10-CM | POA: Insufficient documentation

## 2013-04-21 DIAGNOSIS — E1169 Type 2 diabetes mellitus with other specified complication: Secondary | ICD-10-CM | POA: Insufficient documentation

## 2013-04-21 DIAGNOSIS — L97509 Non-pressure chronic ulcer of other part of unspecified foot with unspecified severity: Secondary | ICD-10-CM | POA: Insufficient documentation

## 2013-04-28 LAB — GLUCOSE, CAPILLARY: Glucose-Capillary: 150 mg/dL — ABNORMAL HIGH (ref 70–99)

## 2013-05-12 LAB — GLUCOSE, CAPILLARY: Glucose-Capillary: 147 mg/dL — ABNORMAL HIGH (ref 70–99)

## 2013-05-19 ENCOUNTER — Encounter (HOSPITAL_BASED_OUTPATIENT_CLINIC_OR_DEPARTMENT_OTHER): Payer: Medicare HMO | Attending: General Surgery

## 2013-05-19 DIAGNOSIS — L97409 Non-pressure chronic ulcer of unspecified heel and midfoot with unspecified severity: Secondary | ICD-10-CM | POA: Insufficient documentation

## 2013-05-19 DIAGNOSIS — L84 Corns and callosities: Secondary | ICD-10-CM | POA: Insufficient documentation

## 2013-05-19 DIAGNOSIS — E1169 Type 2 diabetes mellitus with other specified complication: Secondary | ICD-10-CM | POA: Insufficient documentation

## 2013-05-26 LAB — GLUCOSE, CAPILLARY: Glucose-Capillary: 145 mg/dL — ABNORMAL HIGH (ref 70–99)

## 2013-09-03 ENCOUNTER — Encounter (HOSPITAL_COMMUNITY): Payer: Self-pay | Admitting: Emergency Medicine

## 2013-09-03 ENCOUNTER — Emergency Department (HOSPITAL_COMMUNITY)
Admission: EM | Admit: 2013-09-03 | Discharge: 2013-09-03 | Disposition: A | Discharge status: Home / Self Care | Payer: Medicare HMO | Attending: Emergency Medicine | Admitting: Emergency Medicine

## 2013-09-03 DIAGNOSIS — F17200 Nicotine dependence, unspecified, uncomplicated: Secondary | ICD-10-CM | POA: Insufficient documentation

## 2013-09-03 DIAGNOSIS — Z79899 Other long term (current) drug therapy: Secondary | ICD-10-CM | POA: Insufficient documentation

## 2013-09-03 DIAGNOSIS — E119 Type 2 diabetes mellitus without complications: Secondary | ICD-10-CM | POA: Insufficient documentation

## 2013-09-03 DIAGNOSIS — I10 Essential (primary) hypertension: Secondary | ICD-10-CM | POA: Insufficient documentation

## 2013-09-03 DIAGNOSIS — R21 Rash and other nonspecific skin eruption: Secondary | ICD-10-CM | POA: Insufficient documentation

## 2013-09-03 DIAGNOSIS — M129 Arthropathy, unspecified: Secondary | ICD-10-CM | POA: Insufficient documentation

## 2013-09-03 LAB — CBC WITH DIFFERENTIAL/PLATELET
Basophils Absolute: 0 10*3/uL (ref 0.0–0.1)
Basophils Relative: 0 % (ref 0–1)
Eosinophils Absolute: 0.9 10*3/uL — ABNORMAL HIGH (ref 0.0–0.7)
Eosinophils Relative: 12 % — ABNORMAL HIGH (ref 0–5)
HCT: 41.2 % (ref 39.0–52.0)
Hemoglobin: 14.2 g/dL (ref 13.0–17.0)
Lymphocytes Relative: 30 % (ref 12–46)
Lymphs Abs: 2.1 10*3/uL (ref 0.7–4.0)
MCH: 29.5 pg (ref 26.0–34.0)
MCHC: 34.5 g/dL (ref 30.0–36.0)
MCV: 85.5 fL (ref 78.0–100.0)
Monocytes Absolute: 0.5 10*3/uL (ref 0.1–1.0)
Monocytes Relative: 7 % (ref 3–12)
Neutro Abs: 3.6 10*3/uL (ref 1.7–7.7)
Neutrophils Relative %: 51 % (ref 43–77)
Platelets: 302 10*3/uL (ref 150–400)
RBC: 4.82 MIL/uL (ref 4.22–5.81)
RDW: 15 % (ref 11.5–15.5)
WBC: 7.2 10*3/uL (ref 4.0–10.5)

## 2013-09-03 LAB — COMPREHENSIVE METABOLIC PANEL
ALT: 14 U/L (ref 0–53)
AST: 13 U/L (ref 0–37)
Albumin: 3.8 g/dL (ref 3.5–5.2)
Alkaline Phosphatase: 69 U/L (ref 39–117)
BUN: 13 mg/dL (ref 6–23)
CO2: 23 mEq/L (ref 19–32)
Calcium: 9.6 mg/dL (ref 8.4–10.5)
Chloride: 99 mEq/L (ref 96–112)
Creatinine, Ser: 0.97 mg/dL (ref 0.50–1.35)
GFR calc Af Amer: >90 mL/min (ref >90)
GFR calc non Af Amer: 82 mL/min — ABNORMAL LOW (ref >90)
Glucose, Bld: 301 mg/dL — ABNORMAL HIGH (ref 70–99)
Potassium: 4.4 mEq/L (ref 3.7–5.3)
Sodium: 137 mEq/L (ref 137–147)
Total Bilirubin: 0.3 mg/dL (ref 0.3–1.2)
Total Protein: 8.7 g/dL — ABNORMAL HIGH (ref 6.0–8.3)

## 2013-09-03 LAB — RPR: RPR Ser Ql: NONREACTIVE

## 2013-09-03 MED ORDER — HYDROCODONE-ACETAMINOPHEN 5-325 MG PO TABS: 1.0000 | ORAL_TABLET | ORAL | Status: DC | PRN

## 2013-09-03 NOTE — ED Provider Notes (Signed)
CSN: [id number]     Arrival date & time 09/03/13  1212 History  This chart was scribed for non-physician practitioner Linus Mako, PA-C working with Ezequiel Essex, MD by Ludger Nutting, ED Scribe. This patient was seen in room TR10C/TR10C and the patient's care was started at 12:53 PM.    Chief Complaint  Patient presents with  . Hand Pain      The history is provided by the patient. No language interpreter was used.    HPI Comments: [name] is a 73 y.o. male who presents to the Emergency Department complaining of 2 months of gradual onset, gradually worsening, constant rash and dry sores to the right hand and abdomen. He states these areas are itchy and painful and described as stinging and burning. He has applied OTC creams such as neosporin without relief. He denies fever, decreased appetite, weakness. He denies IV drug use. He reports drinking small amounts of moonshine on a regular basis.   Past Medical History  Diagnosis Date  . Osteomyelitis     Right great toe  . Hypertension   . Arthritis   . Peripheral vascular disease     diabetic  with osteomylitis  . High cholesterol   . Type II diabetes mellitus    Past Surgical History  Procedure Laterality Date  . Incision and drainage of wound Left 2006    "foot" (10/12/2012)  . Circumcision  2010  . Foot fracture surgery Left 1970's    "broke it playing football" (10/12/2012)  . Humerus fracture surgery w/ implant Right 1975>    "put a plate in it" (09/23/8117)  . Amputation  07/23/2011    Procedure: AMPUTATION DIGIT;  Surgeon: Newt Minion, MD;  Location: [place];  Service: Orthopedics;  Laterality: Right;  Right Great Toe Amputation  . Amputation Right 09/18/2012    Procedure: Right Foot 2nd Ray Amputation;  Surgeon: Newt Minion, MD;  Location: [place];  Service: Orthopedics;  Laterality: Right;  Right Foot 2nd Ray Amputation  . Back surgery    . Lumbar disc surgery  2008  . Amputation Right 10/14/2012    Procedure:  AMPUTATION FOOT;  Surgeon: Newt Minion, MD;  Location: [place];  Service: Orthopedics;  Laterality: Right;  Right Foot Transmetatarsal Amputation   Family History  Problem Relation Age of Onset  . Anesthesia problems Neg Hx   . Diabetes Mellitus II Other    History  Substance Use Topics  . Smoking status: Current Every Day Smoker -- 12 years    Types: Pipe  . Smokeless tobacco: Never Used     Comment: 10/12/2012 offered smoking cessation materials; pt declines  . Alcohol Use: No    Review of Systems  Constitutional: Negative for fever and unexpected weight change.  Skin: Positive for rash.  Neurological: Negative for weakness.      Allergies  Review of patient's allergies indicates no known allergies.  Home Medications   Current Outpatient Rx  Name  Route  Sig  Dispense  Refill  . Cyanocobalamin (VITAMIN B-12 PO)   Oral   Take 1 tablet by mouth daily.         Marland Kitchen glipiZIDE (GLUCOTROL XL) 5 MG 24 hr tablet   Oral   Take 5 mg by mouth daily.         . metFORMIN (GLUCOPHAGE) 1000 MG tablet   Oral   Take 1,000 mg by mouth daily with breakfast.          .  pyridOXINE (VITAMIN B-6) 100 MG tablet   Oral   Take 100 mg by mouth daily.         Marland Kitchen HYDROcodone-acetaminophen (NORCO/VICODIN) 5-325 MG per tablet   Oral   Take 1 tablet by mouth every 4 (four) hours as needed.   15 tablet   0    BP 134/81  Pulse 96  Temp(Src) 98.2 F (36.8 C) (Oral)  Resp 18  Ht 6\' 1"  (1.854 m)  Wt 190 lb (86.183 kg)  BMI 25.07 kg/m2  SpO2 99% Physical Exam  Nursing note and vitals reviewed. Constitutional: He is oriented to person, place, and time. He appears well-developed and well-nourished.  HENT:  Head: Normocephalic and atraumatic.  Cardiovascular: Normal rate.   Pulmonary/Chest: Effort normal.  Abdominal: He exhibits no distension.  Musculoskeletal:  Multiple scabs to hand that are excoriated. Rash on hand matches rash to chest. No rash on back, legs, left arm, feet,  palms of hand, face or scalp.  Neurological: He is alert and oriented to person, place, and time.  Skin: Skin is warm and dry.  Psychiatric: He has a normal mood and affect.    ED Course  Procedures (including critical care time)  DIAGNOSTIC STUDIES: Oxygen Saturation is 99% on RA, normal by my interpretation.    COORDINATION OF CARE: 12:55 PM Will have Dr. Wyvonnia Dusky evaluate patient. Discussed treatment plan with pt at bedside and pt agreed to plan.   1:02 PM Will order CBC, CMP, RPR.   Labs Review Labs Reviewed  CBC WITH DIFFERENTIAL - Abnormal; Notable for the following:    Eosinophils Relative 12 (*)    Eosinophils Absolute 0.9 (*)    All other components within normal limits  COMPREHENSIVE METABOLIC PANEL - Abnormal; Notable for the following:    Glucose, Bld 301 (*)    Total Protein 8.7 (*)    GFR calc non Af Amer 82 (*)    All other components within normal limits  RPR   Imaging Review No results found.   EKG Interpretation None      MDM   Final diagnoses:  Rash    Patients rash is accompanied by NO systemic symptoms. Lab work is reassuring. He has tried topical steroid and neosporin with no relief. Will treat pain and have patient follow-up with his PCP.  73 y.o.[name]'s evaluation in the Emergency Department is complete. It has been determined that no acute conditions requiring further emergency intervention are present at this time. The patient/guardian have been advised of the diagnosis and plan. We have discussed signs and symptoms that warrant return to the ED, such as changes or worsening in symptoms.  Vital signs are stable at discharge. Filed Vitals:   09/03/13 1229  BP: 134/81  Pulse: 96  Temp: 98.2 F (36.8 C)  Resp: 18    Patient/guardian has voiced understanding and agreed to follow-up with the PCP or specialist.  I personally performed the services described in this documentation, which was scribed in my presence. The recorded  information has been reviewed and is accurate.   Linus Mako, PA-C 09/03/13 1423

## 2013-09-03 NOTE — ED Notes (Signed)
Pt here for hand pain and numbness, onset 2 months ago, has rash to hands he states stings and burns as well.

## 2013-09-03 NOTE — ED Provider Notes (Signed)
Medical screening examination/treatment/procedure(s) were performed by non-physician practitioner and as supervising physician I was immediately available for consultation/collaboration.   EKG Interpretation None       Ezequiel Essex, MD 09/03/13 [phone]

## 2013-09-03 NOTE — ED Notes (Signed)
Pt has dry sores on right hand that pt states "stings".

## 2013-09-03 NOTE — Discharge Instructions (Signed)
THE BLOOD WORK DONE HERE TODAY IS NORMAL. PLEASE TAKE THE PAIN MEDICATION AS NEEDED FOR THE RASH AND FOLLOW-UP WITH YOUR PRIMARY CARE PROVIDER FOR FURTHER TREATMENT.   Rash A rash is a change in the color or texture of your skin. There are many different types of rashes. You may have other problems that accompany your rash. CAUSES   Infections.  Allergic reactions. This can include allergies to pets or foods.  Certain medicines.  Exposure to certain chemicals, soaps, or cosmetics.  Heat.  Exposure to poisonous plants.  Tumors, both cancerous and noncancerous. SYMPTOMS   Redness.  Scaly skin.  Itchy skin.  Dry or cracked skin.  Bumps.  Blisters.  Pain. DIAGNOSIS  Your caregiver may do a physical exam to determine what type of rash you have. A skin sample (biopsy) may be taken and examined under a microscope. TREATMENT  Treatment depends on the type of rash you have. Your caregiver may prescribe certain medicines. For serious conditions, you may need to see a skin doctor (dermatologist). HOME CARE INSTRUCTIONS   Avoid the substance that caused your rash.  Do not scratch your rash. This can cause infection.  You may take cool baths to help stop itching.  Only take over-the-counter or prescription medicines as directed by your caregiver.  Keep all follow-up appointments as directed by your caregiver. SEEK IMMEDIATE MEDICAL CARE IF:  You have increasing pain, swelling, or redness.  You have a fever.  You have new or severe symptoms.  You have body aches, diarrhea, or vomiting.  Your rash is not better after 3 days. MAKE SURE YOU:  Understand these instructions.  Will watch your condition.  Will get help right away if you are not doing well or get worse. Document Released: 05/24/2002 Document Revised: 08/26/2011 Document Reviewed: 03/18/2011 [facility] Patient Information 2014 [place], [place].

## 2013-09-15 ENCOUNTER — Encounter (HOSPITAL_BASED_OUTPATIENT_CLINIC_OR_DEPARTMENT_OTHER): Payer: Medicare HMO | Attending: General Surgery

## 2013-09-15 DIAGNOSIS — I739 Peripheral vascular disease, unspecified: Secondary | ICD-10-CM | POA: Insufficient documentation

## 2013-09-15 DIAGNOSIS — S98139A Complete traumatic amputation of one unspecified lesser toe, initial encounter: Secondary | ICD-10-CM | POA: Diagnosis not present

## 2013-09-15 DIAGNOSIS — L97509 Non-pressure chronic ulcer of other part of unspecified foot with unspecified severity: Secondary | ICD-10-CM | POA: Insufficient documentation

## 2013-09-15 DIAGNOSIS — L84 Corns and callosities: Secondary | ICD-10-CM | POA: Insufficient documentation

## 2013-09-15 DIAGNOSIS — E1169 Type 2 diabetes mellitus with other specified complication: Secondary | ICD-10-CM | POA: Diagnosis present

## 2013-09-15 DIAGNOSIS — Z79899 Other long term (current) drug therapy: Secondary | ICD-10-CM | POA: Insufficient documentation

## 2013-09-16 NOTE — Progress Notes (Signed)
[facility]  NAME:  [name], [name] NO.:  [id number]  MEDICAL RECORD NO.:  [id number]      DATE OF BIRTH:  1941/03/25  PHYSICIAN:  Judene Companion, M.D.      VISIT DATE:  09/15/2013                                  OFFICE VISIT   This is a 73 year old man who we have known for many years.  He is a diabetic and has had problems with diabetic foot ulcers in the past. The last time he was here was in December when he was treated and healed up a plantar ulcer on his left foot.  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  He had a blood pressure of 150/80, pulse of 80, he weighs 175 pounds.  His medicines are gabapentin, glipizide, metformin, and vitamins.  He has a history of peripheral vascular disease.  I do not feel posterior tibial pulse, but I can feel a dorsalis pedis pulse.  His problem is on the metacarpophalangeal area of his left foot, he had a Wagner 3 diabetic ulcer, which we healed by offloading and using collagen.  Today, he has a great deal of callus and I can see may be the beginnings of a small diabetic ulcer in the middle.  I debrided all of this callus off, and we are treating it by offloading.  We put some soap around the area, and we put some silver alginate over the wound.  He will come back here in a week.  So, his diagnosis is Wagner 3 diabetic ulcer, plantar aspect, left foot.  Other diagnosis is type 2 diabetes.     Judene Companion, M.D.     PP/MEDQ  D:  09/15/2013  T:  09/15/2013  Job:  [id number]

## 2013-09-22 DIAGNOSIS — I739 Peripheral vascular disease, unspecified: Secondary | ICD-10-CM | POA: Diagnosis not present

## 2013-09-22 DIAGNOSIS — E1169 Type 2 diabetes mellitus with other specified complication: Secondary | ICD-10-CM | POA: Diagnosis not present

## 2013-09-22 DIAGNOSIS — L97509 Non-pressure chronic ulcer of other part of unspecified foot with unspecified severity: Secondary | ICD-10-CM | POA: Diagnosis not present

## 2013-09-22 DIAGNOSIS — L84 Corns and callosities: Secondary | ICD-10-CM | POA: Diagnosis not present

## 2013-09-29 DIAGNOSIS — L84 Corns and callosities: Secondary | ICD-10-CM | POA: Diagnosis not present

## 2013-09-29 DIAGNOSIS — I739 Peripheral vascular disease, unspecified: Secondary | ICD-10-CM | POA: Diagnosis not present

## 2013-09-29 DIAGNOSIS — L97509 Non-pressure chronic ulcer of other part of unspecified foot with unspecified severity: Secondary | ICD-10-CM | POA: Diagnosis not present

## 2013-09-29 DIAGNOSIS — E1169 Type 2 diabetes mellitus with other specified complication: Secondary | ICD-10-CM | POA: Diagnosis not present

## 2013-10-06 DIAGNOSIS — E1169 Type 2 diabetes mellitus with other specified complication: Secondary | ICD-10-CM | POA: Diagnosis not present

## 2013-10-06 DIAGNOSIS — I739 Peripheral vascular disease, unspecified: Secondary | ICD-10-CM | POA: Diagnosis not present

## 2013-10-06 DIAGNOSIS — L84 Corns and callosities: Secondary | ICD-10-CM | POA: Diagnosis not present

## 2013-10-06 DIAGNOSIS — L97509 Non-pressure chronic ulcer of other part of unspecified foot with unspecified severity: Secondary | ICD-10-CM | POA: Diagnosis not present

## 2013-10-13 DIAGNOSIS — E1169 Type 2 diabetes mellitus with other specified complication: Secondary | ICD-10-CM | POA: Diagnosis not present

## 2013-10-13 DIAGNOSIS — I739 Peripheral vascular disease, unspecified: Secondary | ICD-10-CM | POA: Diagnosis not present

## 2013-10-13 DIAGNOSIS — L84 Corns and callosities: Secondary | ICD-10-CM | POA: Diagnosis not present

## 2013-10-13 DIAGNOSIS — L97509 Non-pressure chronic ulcer of other part of unspecified foot with unspecified severity: Secondary | ICD-10-CM | POA: Diagnosis not present

## 2013-10-20 ENCOUNTER — Emergency Department (HOSPITAL_COMMUNITY)
Admission: EM | Admit: 2013-10-20 | Discharge: 2013-10-20 | Disposition: A | Discharge status: Home / Self Care | Payer: Medicare HMO | Attending: Emergency Medicine | Admitting: Emergency Medicine

## 2013-10-20 ENCOUNTER — Emergency Department (HOSPITAL_COMMUNITY): Payer: Medicare HMO

## 2013-10-20 ENCOUNTER — Encounter (HOSPITAL_COMMUNITY): Payer: Self-pay | Admitting: Emergency Medicine

## 2013-10-20 ENCOUNTER — Encounter (HOSPITAL_BASED_OUTPATIENT_CLINIC_OR_DEPARTMENT_OTHER): Payer: Medicare HMO | Attending: General Surgery

## 2013-10-20 DIAGNOSIS — Y92009 Unspecified place in unspecified non-institutional (private) residence as the place of occurrence of the external cause: Secondary | ICD-10-CM | POA: Insufficient documentation

## 2013-10-20 DIAGNOSIS — Y9389 Activity, other specified: Secondary | ICD-10-CM | POA: Insufficient documentation

## 2013-10-20 DIAGNOSIS — E1159 Type 2 diabetes mellitus with other circulatory complications: Secondary | ICD-10-CM | POA: Insufficient documentation

## 2013-10-20 DIAGNOSIS — S46909A Unspecified injury of unspecified muscle, fascia and tendon at shoulder and upper arm level, unspecified arm, initial encounter: Secondary | ICD-10-CM | POA: Insufficient documentation

## 2013-10-20 DIAGNOSIS — R296 Repeated falls: Secondary | ICD-10-CM | POA: Insufficient documentation

## 2013-10-20 DIAGNOSIS — L97509 Non-pressure chronic ulcer of other part of unspecified foot with unspecified severity: Secondary | ICD-10-CM | POA: Insufficient documentation

## 2013-10-20 DIAGNOSIS — I739 Peripheral vascular disease, unspecified: Secondary | ICD-10-CM | POA: Insufficient documentation

## 2013-10-20 DIAGNOSIS — M869 Osteomyelitis, unspecified: Secondary | ICD-10-CM | POA: Insufficient documentation

## 2013-10-20 DIAGNOSIS — D72829 Elevated white blood cell count, unspecified: Secondary | ICD-10-CM | POA: Insufficient documentation

## 2013-10-20 DIAGNOSIS — E1169 Type 2 diabetes mellitus with other specified complication: Secondary | ICD-10-CM | POA: Insufficient documentation

## 2013-10-20 DIAGNOSIS — Z79899 Other long term (current) drug therapy: Secondary | ICD-10-CM | POA: Insufficient documentation

## 2013-10-20 DIAGNOSIS — S4980XA Other specified injuries of shoulder and upper arm, unspecified arm, initial encounter: Secondary | ICD-10-CM | POA: Insufficient documentation

## 2013-10-20 DIAGNOSIS — IMO0002 Reserved for concepts with insufficient information to code with codable children: Secondary | ICD-10-CM | POA: Insufficient documentation

## 2013-10-20 DIAGNOSIS — F17200 Nicotine dependence, unspecified, uncomplicated: Secondary | ICD-10-CM | POA: Insufficient documentation

## 2013-10-20 DIAGNOSIS — M129 Arthropathy, unspecified: Secondary | ICD-10-CM | POA: Insufficient documentation

## 2013-10-20 DIAGNOSIS — R748 Abnormal levels of other serum enzymes: Secondary | ICD-10-CM | POA: Insufficient documentation

## 2013-10-20 DIAGNOSIS — I10 Essential (primary) hypertension: Secondary | ICD-10-CM | POA: Insufficient documentation

## 2013-10-20 DIAGNOSIS — E7800 Pure hypercholesterolemia, unspecified: Secondary | ICD-10-CM | POA: Insufficient documentation

## 2013-10-20 DIAGNOSIS — W19XXXA Unspecified fall, initial encounter: Secondary | ICD-10-CM

## 2013-10-20 DIAGNOSIS — S98919A Complete traumatic amputation of unspecified foot, level unspecified, initial encounter: Secondary | ICD-10-CM | POA: Insufficient documentation

## 2013-10-20 LAB — CBC WITH DIFFERENTIAL/PLATELET
Basophils Absolute: 0 10*3/uL (ref 0.0–0.1)
Basophils Relative: 0 % (ref 0–1)
Eosinophils Absolute: 0 10*3/uL (ref 0.0–0.7)
Eosinophils Relative: 0 % (ref 0–5)
HCT: 37.3 % — ABNORMAL LOW (ref 39.0–52.0)
Hemoglobin: 12.5 g/dL — ABNORMAL LOW (ref 13.0–17.0)
Lymphocytes Relative: 6 % — ABNORMAL LOW (ref 12–46)
Lymphs Abs: 1 10*3/uL (ref 0.7–4.0)
MCH: 29 pg (ref 26.0–34.0)
MCHC: 33.5 g/dL (ref 30.0–36.0)
MCV: 86.5 fL (ref 78.0–100.0)
Monocytes Absolute: 1.5 10*3/uL — ABNORMAL HIGH (ref 0.1–1.0)
Monocytes Relative: 8 % (ref 3–12)
Neutro Abs: 15.5 10*3/uL — ABNORMAL HIGH (ref 1.7–7.7)
Neutrophils Relative %: 86 % — ABNORMAL HIGH (ref 43–77)
Platelets: 233 10*3/uL (ref 150–400)
RBC: 4.31 MIL/uL (ref 4.22–5.81)
RDW: 14.8 % (ref 11.5–15.5)
WBC: 18 10*3/uL — ABNORMAL HIGH (ref 4.0–10.5)

## 2013-10-20 LAB — I-STAT VENOUS BLOOD GAS, ED
Acid-base deficit: 1 mmol/L (ref 0.0–2.0)
Bicarbonate: 24.3 mEq/L — ABNORMAL HIGH (ref 20.0–24.0)
O2 Saturation: 57 %
TCO2: 25 mmol/L (ref 0–100)
pCO2, Ven: 39.6 mmHg — ABNORMAL LOW (ref 45.0–50.0)
pH, Ven: 7.396 — ABNORMAL HIGH (ref 7.250–7.300)
pO2, Ven: 30 mmHg (ref 30.0–45.0)

## 2013-10-20 LAB — GLUCOSE, CAPILLARY: Glucose-Capillary: 144 mg/dL — ABNORMAL HIGH (ref 70–99)

## 2013-10-20 LAB — URINALYSIS, ROUTINE W REFLEX MICROSCOPIC
Glucose, UA: NEGATIVE mg/dL
Hgb urine dipstick: NEGATIVE
Ketones, ur: 15 mg/dL — AB
Nitrite: NEGATIVE
Protein, ur: 30 mg/dL — AB
Specific Gravity, Urine: 1.024 (ref 1.005–1.030)
Urobilinogen, UA: 0.2 mg/dL (ref 0.0–1.0)
pH: 5 (ref 5.0–8.0)

## 2013-10-20 LAB — COMPREHENSIVE METABOLIC PANEL
ALT: 15 U/L (ref 0–53)
AST: 20 U/L (ref 0–37)
Albumin: 3.6 g/dL (ref 3.5–5.2)
Alkaline Phosphatase: 60 U/L (ref 39–117)
BUN: 19 mg/dL (ref 6–23)
CO2: 21 mEq/L (ref 19–32)
Calcium: 9.4 mg/dL (ref 8.4–10.5)
Chloride: 99 mEq/L (ref 96–112)
Creatinine, Ser: 1.4 mg/dL — ABNORMAL HIGH (ref 0.50–1.35)
GFR calc Af Amer: 57 mL/min — ABNORMAL LOW (ref >90)
GFR calc non Af Amer: 49 mL/min — ABNORMAL LOW (ref >90)
Glucose, Bld: 136 mg/dL — ABNORMAL HIGH (ref 70–99)
Potassium: 4.3 mEq/L (ref 3.7–5.3)
Sodium: 138 mEq/L (ref 137–147)
Total Bilirubin: 1 mg/dL (ref 0.3–1.2)
Total Protein: 8.1 g/dL (ref 6.0–8.3)

## 2013-10-20 LAB — URINE MICROSCOPIC-ADD ON

## 2013-10-20 LAB — LIPASE, BLOOD: Lipase: 9 U/L — ABNORMAL LOW (ref 11–59)

## 2013-10-20 MED ORDER — SULFAMETHOXAZOLE-TMP DS 800-160 MG PO TABS: 1.0000 | ORAL_TABLET | Freq: Two times a day (BID) | ORAL | Status: DC

## 2013-10-20 MED ORDER — SODIUM CHLORIDE 0.9 % IV BOLUS (SEPSIS)
1000.0000 mL | Freq: Once | INTRAVENOUS | Status: AC
  Administered 2013-10-20: 1000 mL via INTRAVENOUS

## 2013-10-20 MED ORDER — SULFAMETHOXAZOLE-TMP DS 800-160 MG PO TABS
1.0000 | ORAL_TABLET | Freq: Once | ORAL | Status: AC
  Administered 2013-10-20: 1 via ORAL
  Filled 2013-10-20: qty 1

## 2013-10-20 NOTE — Discharge Instructions (Signed)
As discussed, your evaluation here is largely reassuring, though there is concern for the infection in her foot.  Please take all medication as directed and be sure to followup with both your primary care physician and your wound care specialist.   Return here for concerning changes in your condition.

## 2013-10-20 NOTE — ED Provider Notes (Signed)
CSN: [id number]     Arrival date & time 10/20/13  1553 History   First MD Initiated Contact with Patient 10/20/13 1559     Chief Complaint  Patient presents with  . Fall  . Weakness      HPI  Patient presents with weakness, falls. The patient is afebrile yesterday, when he developed generalized weakness, with no focal pain. Today the patient has had 2 falls secondary to weakness. There has been no subsequent loss of consciousness, confusion, disorientation. Patient has had pain persistently in the left shoulder, worse with motion, sore, nonradiating.   Past Medical History  Diagnosis Date  . Osteomyelitis     Right great toe  . Hypertension   . Arthritis   . Peripheral vascular disease     diabetic  with osteomylitis  . High cholesterol   . Type II diabetes mellitus    Past Surgical History  Procedure Laterality Date  . Incision and drainage of wound Left 2006    "foot" (10/12/2012)  . Circumcision  2010  . Foot fracture surgery Left 1970's    "broke it playing football" (10/12/2012)  . Humerus fracture surgery w/ implant Right 1975>    "put a plate in it" (6/44/0347)  . Amputation  07/23/2011    Procedure: AMPUTATION DIGIT;  Surgeon: Newt Minion, MD;  Location: [place];  Service: Orthopedics;  Laterality: Right;  Right Great Toe Amputation  . Amputation Right 09/18/2012    Procedure: Right Foot 2nd Ray Amputation;  Surgeon: Newt Minion, MD;  Location: [place];  Service: Orthopedics;  Laterality: Right;  Right Foot 2nd Ray Amputation  . Back surgery    . Lumbar disc surgery  2008  . Amputation Right 10/14/2012    Procedure: AMPUTATION FOOT;  Surgeon: Newt Minion, MD;  Location: [place];  Service: Orthopedics;  Laterality: Right;  Right Foot Transmetatarsal Amputation   Family History  Problem Relation Age of Onset  . Anesthesia problems Neg Hx   . Diabetes Mellitus II Other    History  Substance Use Topics  . Smoking status: Current Every Day Smoker -- 12 years   Types: Pipe  . Smokeless tobacco: Never Used     Comment: 10/12/2012 offered smoking cessation materials; pt declines  . Alcohol Use: No    Review of Systems  Constitutional:       Per HPI, otherwise negative  HENT:       Per HPI, otherwise negative  Respiratory:       Per HPI, otherwise negative  Cardiovascular:       Per HPI, otherwise negative  Gastrointestinal: Negative for vomiting.  Endocrine:       Negative aside from HPI  Genitourinary:       Neg aside from HPI   Musculoskeletal:       Per HPI, otherwise negative  Skin: Negative.   Neurological: Positive for weakness. Negative for syncope.      Allergies  Review of patient's allergies indicates no known allergies.  Home Medications   Prior to Admission medications   Medication Sig Start Date End Date Taking? Authorizing Provider  Cyanocobalamin (VITAMIN B-12 PO) Take 1 tablet by mouth daily.   Yes Historical Provider, MD  glipiZIDE (GLUCOTROL XL) 5 MG 24 hr tablet Take 5 mg by mouth daily.   Yes Historical Provider, MD  HYDROcodone-acetaminophen (NORCO/VICODIN) 5-325 MG per tablet Take 1 tablet by mouth every 4 (four) hours as needed. 09/03/13  Yes Linus Mako, PA-C  HYDROcodone-acetaminophen (NORCO/VICODIN) 5-325 MG per tablet Take 1 tablet by mouth every 4 (four) hours as needed for moderate pain.   Yes Historical Provider, MD  lisinopril (PRINIVIL,ZESTRIL) 20 MG tablet Take 20 mg by mouth daily.   Yes Historical Provider, MD  metFORMIN (GLUCOPHAGE) 1000 MG tablet Take 1,000 mg by mouth daily with breakfast.    Yes Historical Provider, MD  pyridOXINE (VITAMIN B-6) 100 MG tablet Take 100 mg by mouth daily.   Yes Historical Provider, MD   BP 117/55  Pulse 89  Temp(Src) 99.2 F (37.3 C) (Oral)  Resp 20  Ht 6\' 1"  (1.854 m)  Wt 200 lb (90.719 kg)  BMI 26.39 kg/m2  SpO2 98% Physical Exam  Nursing note and vitals reviewed. Constitutional: He is oriented to person, place, and time. He appears well-developed.  No distress.  HENT:  Head: Normocephalic and atraumatic.  Eyes: Conjunctivae and EOM are normal.  Cardiovascular: Normal rate and regular rhythm.   Pulmonary/Chest: Effort normal. No stridor. No respiratory distress.  Abdominal: He exhibits no distension.  Musculoskeletal: He exhibits no edema.       Left shoulder: He exhibits decreased range of motion, tenderness and bony tenderness. He exhibits no swelling, no effusion and no crepitus.       Legs:      Feet:  Neurological: He is alert and oriented to person, place, and time.  Skin: Skin is warm and dry.  Psychiatric: He has a normal mood and affect.    ED Course  Procedures (including critical care time) Labs Review Labs Reviewed  CBC WITH DIFFERENTIAL - Abnormal; Notable for the following:    WBC 18.0 (*)    Hemoglobin 12.5 (*)    HCT 37.3 (*)    Neutrophils Relative % 86 (*)    Neutro Abs 15.5 (*)    Lymphocytes Relative 6 (*)    Monocytes Absolute 1.5 (*)    All other components within normal limits  COMPREHENSIVE METABOLIC PANEL - Abnormal; Notable for the following:    Glucose, Bld 136 (*)    Creatinine, Ser 1.40 (*)    GFR calc non Af Amer 49 (*)    GFR calc Af Amer 57 (*)    All other components within normal limits  LIPASE, BLOOD - Abnormal; Notable for the following:    Lipase 9 (*)    All other components within normal limits  URINALYSIS, ROUTINE W REFLEX MICROSCOPIC - Abnormal; Notable for the following:    Color, Urine AMBER (*)    APPearance HAZY (*)    Bilirubin Urine SMALL (*)    Ketones, ur 15 (*)    Protein, ur 30 (*)    Leukocytes, UA TRACE (*)    All other components within normal limits  URINE MICROSCOPIC-ADD ON - Abnormal; Notable for the following:    Casts HYALINE CASTS (*)    All other components within normal limits  I-STAT VENOUS BLOOD GAS, ED - Abnormal; Notable for the following:    pH, Ven 7.396 (*)    pCO2, Ven 39.6 (*)    Bicarbonate 24.3 (*)    All other components within normal  limits  BLOOD GAS, VENOUS    Imaging Review Dg Chest 1 View  10/20/2013   CLINICAL DATA:  [place].  Chest and left shoulder pain.  EXAM: CHEST - 1 VIEW  COMPARISON:  10/11/2012.  FINDINGS: The heart is within normal limits in size. There is tortuosity of the thoracic aorta. The lungs are clear. No  pleural effusion or pneumothorax. Intact bony thorax.  IMPRESSION: No acute cardiopulmonary findings and intact bony thorax.   Electronically Signed   By: Kalman Jewels M.D.   On: 10/20/2013 18:35   Dg Shoulder Left  10/20/2013   CLINICAL DATA:  [place].  Left shoulder pain.  EXAM: LEFT SHOULDER - 2+ VIEW  COMPARISON:  None.  FINDINGS: The joint spaces are maintained. There are mild AC joint degenerative changes and inferior spurring from the acromion. No acute fractures identified. No dislocation. The visualized left ribs are intact.  IMPRESSION: No acute fracture or dislocation.   Electronically Signed   By: Kalman Jewels M.D.   On: 10/20/2013 18:34   Dg Foot Complete Left  10/20/2013   CLINICAL DATA:  Osteomyelitis.  Plantar foot wound.  EXAM: LEFT FOOT - COMPLETE 3+ VIEW  COMPARISON:  DG FOOT COMPLETE*L* dated 07/28/2012  FINDINGS: Valgus is present with moderate first MTP joint osteoarthritis. Midfoot degenerative changes are present, along with the severe pes plana [place]. Midfoot changes suggest Charcot arthropathy. No ulceration is identified radiographically. Diabetic type small vessel atherosclerotic calcifications. There is no plain film evidence of osteomyelitis.  IMPRESSION: No acute osseous abnormality. No osteolysis to suggest scar that no plain film evidence of osteomyelitis. Chronic changes of the foot.   Electronically Signed   By: Dereck Ligas M.D.   On: 10/20/2013 20:32     EKG Interpretation   Date/Time:  Wednesday Oct 20 2013 15:54:10 EDT Ventricular Rate:  99 PR Interval:  166 QRS Duration: 124 QT Interval:  373 QTC Calculation: 479 R Axis:   -29 Text Interpretation:  Sinus rhythm  Consider right atrial enlargement Right  bundle branch block Sinus rhythm Right bundle branch block Abnormal ekg  Confirmed by Carmin Muskrat  MD ([id number]) on 10/20/2013 4:48:48 PM      On repeat exam, the patient is found to have a purulent lesion on the plantar surface of his distal left foot. He states that he now feels substantially better having received IV fluids.   MDM  Patient presents with fatigue, weakness and falls. On exam patient is awake, alert, neurologically intact.  The patient's evaluation demonstrates a leukocytosis, mild elevation in creatinine. Patient is not hypoglycemic, with no evidence of acidosis or DKA. Patient improved substantially her with fluids.  The patient antibiotics for his foot lesion, discharged in stable condition. Patient has home health wound care, and for followup    Carmin Muskrat, MD 10/20/13 2038

## 2013-10-20 NOTE — ED Notes (Signed)
Pt in xray

## 2013-10-20 NOTE — ED Notes (Signed)
NOTIFIED DR. LOCKWOOD FOR PATIENTS LAB RESULTS OF I-STAT G3+ VENOUS BLOOD GAS ,@18 :46 PM ,10/20/2013.

## 2013-10-20 NOTE — ED Notes (Signed)
Per EMS, pt fell twice today at home, after second fall pt friend called for ambulance. No pre-arrival treatment. Pt is known insulin dependent diabetic, cbg was 135 pre-arrival. BP was 124/64, pulse 76.

## 2013-10-20 NOTE — ED Notes (Signed)
Pt called friend for ride.

## 2013-10-24 ENCOUNTER — Emergency Department (HOSPITAL_COMMUNITY)
Admission: EM | Admit: 2013-10-24 | Discharge: 2013-10-24 | Disposition: A | Discharge status: Home / Self Care | Payer: Medicare HMO | Attending: Emergency Medicine | Admitting: Emergency Medicine

## 2013-10-24 ENCOUNTER — Encounter (HOSPITAL_COMMUNITY): Payer: Self-pay | Admitting: Emergency Medicine

## 2013-10-24 ENCOUNTER — Emergency Department (HOSPITAL_COMMUNITY): Payer: Medicare HMO

## 2013-10-24 DIAGNOSIS — M869 Osteomyelitis, unspecified: Secondary | ICD-10-CM | POA: Insufficient documentation

## 2013-10-24 DIAGNOSIS — R202 Paresthesia of skin: Secondary | ICD-10-CM

## 2013-10-24 DIAGNOSIS — M9080 Osteopathy in diseases classified elsewhere, unspecified site: Secondary | ICD-10-CM | POA: Diagnosis not present

## 2013-10-24 DIAGNOSIS — R5383 Other fatigue: Secondary | ICD-10-CM

## 2013-10-24 DIAGNOSIS — Z8739 Personal history of other diseases of the musculoskeletal system and connective tissue: Secondary | ICD-10-CM | POA: Diagnosis not present

## 2013-10-24 DIAGNOSIS — Z79899 Other long term (current) drug therapy: Secondary | ICD-10-CM | POA: Insufficient documentation

## 2013-10-24 DIAGNOSIS — R5381 Other malaise: Secondary | ICD-10-CM | POA: Insufficient documentation

## 2013-10-24 DIAGNOSIS — F17200 Nicotine dependence, unspecified, uncomplicated: Secondary | ICD-10-CM | POA: Insufficient documentation

## 2013-10-24 DIAGNOSIS — E1169 Type 2 diabetes mellitus with other specified complication: Secondary | ICD-10-CM | POA: Diagnosis not present

## 2013-10-24 DIAGNOSIS — R209 Unspecified disturbances of skin sensation: Secondary | ICD-10-CM | POA: Diagnosis not present

## 2013-10-24 DIAGNOSIS — I10 Essential (primary) hypertension: Secondary | ICD-10-CM | POA: Insufficient documentation

## 2013-10-24 DIAGNOSIS — R300 Dysuria: Secondary | ICD-10-CM | POA: Diagnosis not present

## 2013-10-24 DIAGNOSIS — N5082 Scrotal pain: Secondary | ICD-10-CM

## 2013-10-24 DIAGNOSIS — N509 Disorder of male genital organs, unspecified: Secondary | ICD-10-CM | POA: Insufficient documentation

## 2013-10-24 DIAGNOSIS — M25549 Pain in joints of unspecified hand: Secondary | ICD-10-CM | POA: Diagnosis not present

## 2013-10-24 LAB — BASIC METABOLIC PANEL
BUN: 9 mg/dL (ref 6–23)
CO2: 20 mEq/L (ref 19–32)
Calcium: 8.7 mg/dL (ref 8.4–10.5)
Chloride: 98 mEq/L (ref 96–112)
Creatinine, Ser: 1.01 mg/dL (ref 0.50–1.35)
GFR calc Af Amer: 85 mL/min — ABNORMAL LOW (ref >90)
GFR calc non Af Amer: 73 mL/min — ABNORMAL LOW (ref >90)
Glucose, Bld: 133 mg/dL — ABNORMAL HIGH (ref 70–99)
Potassium: 3.7 mEq/L (ref 3.7–5.3)
Sodium: 136 mEq/L — ABNORMAL LOW (ref 137–147)

## 2013-10-24 LAB — CBC WITH DIFFERENTIAL/PLATELET
Basophils Absolute: 0 10*3/uL (ref 0.0–0.1)
Basophils Relative: 0 % (ref 0–1)
Eosinophils Absolute: 0.1 10*3/uL (ref 0.0–0.7)
Eosinophils Relative: 1 % (ref 0–5)
HCT: 33.6 % — ABNORMAL LOW (ref 39.0–52.0)
Hemoglobin: 11.4 g/dL — ABNORMAL LOW (ref 13.0–17.0)
Lymphocytes Relative: 13 % (ref 12–46)
Lymphs Abs: 1.1 10*3/uL (ref 0.7–4.0)
MCH: 28.9 pg (ref 26.0–34.0)
MCHC: 33.9 g/dL (ref 30.0–36.0)
MCV: 85.3 fL (ref 78.0–100.0)
Monocytes Absolute: 1.1 10*3/uL — ABNORMAL HIGH (ref 0.1–1.0)
Monocytes Relative: 13 % — ABNORMAL HIGH (ref 3–12)
Neutro Abs: 5.9 10*3/uL (ref 1.7–7.7)
Neutrophils Relative %: 72 % (ref 43–77)
Platelets: 275 10*3/uL (ref 150–400)
RBC: 3.94 MIL/uL — ABNORMAL LOW (ref 4.22–5.81)
RDW: 14.8 % (ref 11.5–15.5)
WBC: 8.2 10*3/uL (ref 4.0–10.5)

## 2013-10-24 LAB — URINALYSIS, ROUTINE W REFLEX MICROSCOPIC
Glucose, UA: NEGATIVE mg/dL
Ketones, ur: NEGATIVE mg/dL
Leukocytes, UA: NEGATIVE
Nitrite: NEGATIVE
Protein, ur: 100 mg/dL — AB
Specific Gravity, Urine: 1.019 (ref 1.005–1.030)
Urobilinogen, UA: 1 mg/dL (ref 0.0–1.0)
pH: 5.5 (ref 5.0–8.0)

## 2013-10-24 LAB — URINE MICROSCOPIC-ADD ON

## 2013-10-24 MED ORDER — LIDOCAINE HCL (PF) 1 % IJ SOLN
INTRAMUSCULAR | Status: AC
  Administered 2013-10-24: 0.9 mL
  Filled 2013-10-24: qty 5

## 2013-10-24 MED ORDER — CEFTRIAXONE SODIUM 250 MG IJ SOLR
250.0000 mg | Freq: Once | INTRAMUSCULAR | Status: AC
  Administered 2013-10-24: 250 mg via INTRAMUSCULAR
  Filled 2013-10-24: qty 250

## 2013-10-24 MED ORDER — AZITHROMYCIN 1 G PO PACK
1.0000 g | PACK | Freq: Once | ORAL | Status: AC
  Administered 2013-10-24: 1 g via ORAL
  Filled 2013-10-24: qty 1

## 2013-10-24 NOTE — ED Notes (Signed)
Pt arrived from home by [facility] with c/o generalized weakness, bilateral hand pain that feels like pins and needles and genitalia pain. Pt c/o burning with urination but stated that genitalia pain continues to burn without urinating. CBG 119, BP-126palp, HR-100, Resp-16. 10/10 pain.

## 2013-10-24 NOTE — Discharge Instructions (Signed)
Dysuria Dysuria is the medical term for pain with urination. There are many causes for dysuria, but urinary tract infection is the most common. If a urinalysis was performed it can show that there is a urinary tract infection. A urine culture confirms that you or your child is sick. You will need to follow up with a healthcare provider because:  If a urine culture was done you will need to know the culture results and treatment recommendations.  If the urine culture was positive, you or your child will need to be put on antibiotics or know if the antibiotics prescribed are the right antibiotics for your urinary tract infection.  If the urine culture is negative (no urinary tract infection), then other causes may need to be explored or antibiotics need to be stopped. Today laboratory work may have been done and there does not seem to be an infection. If cultures were done they will take at least 24 to 48 hours to be completed. Today x-rays may have been taken and they read as normal. No cause can be found for the problems. The x-rays may be re-read by a radiologist and you will be contacted if additional findings are made. You or your child may have been put on medications to help with this problem until you can see your primary caregiver. If the problems get better, see your primary caregiver if the problems return. If you were given antibiotics (medications which kill germs), take all of the mediations as directed for the full course of treatment.  If laboratory work was done, you need to find the results. Leave a telephone number where you can be reached. If this is not possible, make sure you find out how you are to get test results. HOME CARE INSTRUCTIONS   Drink lots of fluids. For adults, drink eight, 8 ounce glasses of clear juice or water a day. For children, replace fluids as suggested by your caregiver.  Empty the bladder often. Avoid holding urine for long periods of time.  After a bowel  movement, women should cleanse front to back, using each tissue only once.  Empty your bladder before and after sexual intercourse.  Take all the medicine given to you until it is gone. You may feel better in a few days, but TAKE ALL MEDICINE.  Avoid caffeine, tea, alcohol and carbonated beverages, because they tend to irritate the bladder.  In men, alcohol may irritate the prostate.  Only take over-the-counter or prescription medicines for pain, discomfort, or fever as directed by your caregiver.  If your caregiver has given you a follow-up appointment, it is very important to keep that appointment. Not keeping the appointment could result in a chronic or permanent injury, pain, and disability. If there is any problem keeping the appointment, you must call back to this facility for assistance. SEEK IMMEDIATE MEDICAL CARE IF:   Back pain develops.  A fever develops.  There is nausea (feeling sick to your stomach) or vomiting (throwing up).  Problems are no better with medications or are getting worse. MAKE SURE YOU:   Understand these instructions.  Will watch your condition.  Will get help right away if you are not doing well or get worse. Document Released: 03/01/2004 Document Revised: 08/26/2011 Document Reviewed: 01/07/2008 [facility] Patient Information 2014 [place]. Paresthesia Paresthesia is an abnormal burning or prickling sensation. This sensation is generally felt in the hands, arms, legs, or feet. However, it may occur in any part of the body. It is  usually not painful. The feeling may be described as:  Tingling or numbness.  "Pins and needles."  Skin crawling.  Buzzing.  Limbs "falling asleep."  Itching. Most people experience temporary (transient) paresthesia at some time in their lives. CAUSES  Paresthesia may occur when you breathe too quickly (hyperventilation). It can also occur without any apparent cause. Commonly, paresthesia occurs when  pressure is placed on a nerve. The feeling quickly goes away once the pressure is removed. For some people, however, paresthesia is a long-lasting (chronic) condition caused by an underlying disorder. The underlying disorder may be:  A traumatic, direct injury to nerves. Examples include a:  Broken (fractured) neck.  Fractured skull.  A disorder affecting the brain and spinal cord (central nervous system). Examples include:  Transverse myelitis.  Encephalitis.  Transient ischemic attack.  Multiple sclerosis.  Stroke.  Tumor or blood vessel problems, such as an arteriovenous malformation pressing against the brain or spinal cord.  A condition that damages the peripheral nerves (peripheral neuropathy). Peripheral nerves are not part of the brain and spinal cord. These conditions include:  Diabetes.  Peripheral vascular disease.  Nerve entrapment syndromes, such as carpal tunnel syndrome.  Shingles.  Hypothyroidism.  Vitamin B12 deficiencies.  Alcoholism.  Heavy metal poisoning (lead, arsenic).  Rheumatoid arthritis.  Systemic lupus erythematosus. DIAGNOSIS  Your caregiver will attempt to find the underlying cause of your paresthesia. Your caregiver may:  Take your medical history.  Perform a physical exam.  Order various lab tests.  Order imaging tests. TREATMENT  Treatment for paresthesia depends on the underlying cause. HOME CARE INSTRUCTIONS  Avoid drinking alcohol.  You may consider massage or acupuncture to help relieve your symptoms.  Keep all follow-up appointments as directed by your caregiver. SEEK IMMEDIATE MEDICAL CARE IF:   You feel weak.  You have trouble walking or moving.  You have problems with speech or vision.  You feel confused.  You cannot control your bladder or bowel movements.  You feel numbness after an injury.  You faint.  Your burning or prickling feeling gets worse when walking.  You have pain, cramps, or  dizziness.  You develop a rash. MAKE SURE YOU:  Understand these instructions.  Will watch your condition.  Will get help right away if you are not doing well or get worse. Document Released: 05/24/2002 Document Revised: 08/26/2011 Document Reviewed: 02/22/2011 [facility] Patient Information 2014 [place].

## 2013-10-24 NOTE — ED Notes (Signed)
Patient transported to Ultrasound 

## 2013-10-24 NOTE — ED Provider Notes (Signed)
CSN: [id number]     Arrival date & time 10/24/13  1052 History   First MD Initiated Contact with Patient 10/24/13 1153     Chief Complaint  Patient presents with  . Hand Pain  . Dysuria  . Weakness     (Consider location/radiation/quality/duration/timing/severity/associated sxs/prior Treatment) Patient is a 73 y.o. male presenting with hand pain, dysuria, and weakness. The history is provided by the patient. No language interpreter was used.  Hand Pain This is a new problem. The current episode started more than 2 days ago. The problem occurs constantly. The problem has not changed since onset.Pertinent negatives include no chest pain, no abdominal pain, no headaches and no shortness of breath. Nothing aggravates the symptoms. Nothing relieves the symptoms. He has tried nothing for the symptoms. The treatment provided no relief.  Dysuria This is a new problem. The current episode started more than 2 days ago. The problem occurs constantly. The problem has not changed since onset.Pertinent negatives include no chest pain, no abdominal pain, no headaches and no shortness of breath. Associated symptoms comments: BL scrotal pain. Exacerbated by: urination. Nothing relieves the symptoms. He has tried nothing for the symptoms. The treatment provided no relief.  Weakness Pertinent negatives include no chest pain, no abdominal pain, no headaches and no shortness of breath.    Past Medical History  Diagnosis Date  . Osteomyelitis     Right great toe  . Hypertension   . Arthritis   . Peripheral vascular disease     diabetic  with osteomylitis  . High cholesterol   . Type II diabetes mellitus    Past Surgical History  Procedure Laterality Date  . Incision and drainage of wound Left 2006    "foot" (10/12/2012)  . Circumcision  2010  . Foot fracture surgery Left 1970's    "broke it playing football" (10/12/2012)  . Humerus fracture surgery w/ implant Right 1975>    "put a plate in it"  (09/05/252)  . Amputation  07/23/2011    Procedure: AMPUTATION DIGIT;  Surgeon: Newt Minion, MD;  Location: [facility];  Service: Orthopedics;  Laterality: Right;  Right Great Toe Amputation  . Amputation Right 09/18/2012    Procedure: Right Foot 2nd Ray Amputation;  Surgeon: Newt Minion, MD;  Location: [facility];  Service: Orthopedics;  Laterality: Right;  Right Foot 2nd Ray Amputation  . Back surgery    . Lumbar disc surgery  2008  . Amputation Right 10/14/2012    Procedure: AMPUTATION FOOT;  Surgeon: Newt Minion, MD;  Location: [facility];  Service: Orthopedics;  Laterality: Right;  Right Foot Transmetatarsal Amputation   Family History  Problem Relation Age of Onset  . Anesthesia problems Neg Hx   . Diabetes Mellitus II Other    History  Substance Use Topics  . Smoking status: Current Every Day Smoker -- 12 years    Types: Pipe  . Smokeless tobacco: Never Used     Comment: 10/12/2012 offered smoking cessation materials; pt declines  . Alcohol Use: No    Review of Systems  Constitutional: Negative for fever, activity change, appetite change and fatigue.  HENT: Negative for congestion, facial swelling, rhinorrhea and trouble swallowing.   Eyes: Negative for photophobia and pain.  Respiratory: Negative for cough, chest tightness and shortness of breath.   Cardiovascular: Negative for chest pain and leg swelling.  Gastrointestinal: Negative for nausea, vomiting, abdominal pain, diarrhea and constipation.  Endocrine: Negative for polydipsia and polyuria.  Genitourinary: Positive  for dysuria. Negative for urgency, decreased urine volume and difficulty urinating.  Musculoskeletal: Negative for back pain and gait problem.  Skin: Negative for color change, rash and wound.  Allergic/Immunologic: Negative for immunocompromised state.  Neurological: Positive for weakness. Negative for dizziness, facial asymmetry, speech difficulty, numbness and headaches.  Psychiatric/Behavioral: Negative for  confusion, decreased concentration and agitation.      Allergies  Review of patient's allergies indicates no known allergies.  Home Medications   Prior to Admission medications   Medication Sig Start Date End Date Taking? Authorizing Provider  Cyanocobalamin (VITAMIN B-12 PO) Take 1 tablet by mouth daily.   Yes Historical Provider, MD  gabapentin (NEURONTIN) 100 MG capsule Take 100 mg by mouth 3 (three) times daily.   Yes Historical Provider, MD  glipiZIDE (GLUCOTROL XL) 5 MG 24 hr tablet Take 5 mg by mouth 2 (two) times daily.    Yes Historical Provider, MD  HYDROcodone-acetaminophen (NORCO/VICODIN) 5-325 MG per tablet Take 1 tablet by mouth every 4 (four) hours as needed for moderate pain.   Yes Historical Provider, MD  lisinopril (PRINIVIL,ZESTRIL) 20 MG tablet Take 20 mg by mouth daily.   Yes Historical Provider, MD  metFORMIN (GLUCOPHAGE) 1000 MG tablet Take 1,000 mg by mouth daily with breakfast.    Yes Historical Provider, MD  pyridOXINE (VITAMIN B-6) 100 MG tablet Take 100 mg by mouth daily.   Yes Historical Provider, MD   BP 130/66  Pulse 72  Temp(Src) 98.7 F (37.1 C) (Oral)  Resp 23  Ht 6\' 1"  (1.854 m)  Wt 195 lb (88.451 kg)  BMI 25.73 kg/m2  SpO2 99% Physical Exam  Constitutional: He is oriented to person, place, and time. He appears well-developed and well-nourished. No distress.  HENT:  Head: Normocephalic and atraumatic.  Mouth/Throat: No oropharyngeal exudate.  Eyes: Pupils are equal, round, and reactive to light.  Neck: Normal range of motion. Neck supple.  Cardiovascular: Normal rate, regular rhythm and normal heart sounds.  Exam reveals no gallop and no friction rub.   No murmur heard. Pulmonary/Chest: Effort normal and breath sounds normal. No respiratory distress. He has no wheezes. He has no rales.  Abdominal: Soft. Bowel sounds are normal. He exhibits no distension and no mass. There is no tenderness. There is no rebound and no guarding. Hernia confirmed  negative in the right inguinal area and confirmed negative in the left inguinal area.  Genitourinary: Right testis shows tenderness. Right testis shows no mass and no swelling. Left testis shows tenderness. Left testis shows no mass and no swelling. Uncircumcised. No penile erythema. No discharge found.  Musculoskeletal: Normal range of motion. He exhibits no edema and no tenderness.  Lymphadenopathy:       Right: No inguinal adenopathy present.       Left: No inguinal adenopathy present.  Neurological: He is alert and oriented to person, place, and time. He has normal strength. He displays no tremor. No cranial nerve deficit or sensory deficit. He exhibits normal muscle tone. GCS eye subscore is 4. GCS verbal subscore is 5. GCS motor subscore is 6.  Reports nml sensation.   Skin: Skin is warm and dry.  Psychiatric: He has a normal mood and affect.    ED Course  Procedures (including critical care time) Labs Review Labs Reviewed  CBC WITH DIFFERENTIAL - Abnormal; Notable for the following:    RBC 3.94 (*)    Hemoglobin 11.4 (*)    HCT 33.6 (*)    Monocytes Relative 13 (*)  Monocytes Absolute 1.1 (*)    All other components within normal limits  BASIC METABOLIC PANEL - Abnormal; Notable for the following:    Sodium 136 (*)    Glucose, Bld 133 (*)    GFR calc non Af Amer 73 (*)    GFR calc Af Amer 85 (*)    All other components within normal limits  URINALYSIS, ROUTINE W REFLEX MICROSCOPIC - Abnormal; Notable for the following:    Color, Urine AMBER (*)    APPearance CLOUDY (*)    Hgb urine dipstick TRACE (*)    Bilirubin Urine SMALL (*)    Protein, ur 100 (*)    All other components within normal limits  URINE MICROSCOPIC-ADD ON - Abnormal; Notable for the following:    Casts HYALINE CASTS (*)    All other components within normal limits  URINE CULTURE  GC/CHLAMYDIA PROBE AMP    Imaging Review [place] Art/ven Flow Abd Pelv Doppler  10/24/2013   CLINICAL DATA:  Dysuria.   Bilateral scrotal pain.  EXAM: SCROTAL ULTRASOUND  DOPPLER ULTRASOUND OF THE TESTICLES  TECHNIQUE: Complete ultrasound examination of the testicles, epididymis, and other scrotal structures was performed. Color and spectral Doppler ultrasound were also utilized to evaluate blood flow to the testicles.  COMPARISON:  None.  FINDINGS: Right testicle  Measurements: 5.0 x 2.4 x 3.4 cm, within normal limits. Background parenchyma is heterogeneous, within normal limits for age. No mass or microlithiasis visualized.  Left testicle  Measurements: 5.3 by 2.8 x 3.3 cm, within normal limits. Background parenchyma is heterogeneous, within normal limits for age. No mass or microlithiasis visualized.  Right epididymis: An 11 mm benign appearing cyst is noted at the tail of the right epididymis. The epididymis is otherwise within normal limits  Left epididymis:  Normal in size and appearance.  Hydrocele:  None visualized.  Varicocele: Bilateral varicoceles are present. The veins measure up to 4 mm.  Pulsed Doppler interrogation of both testes demonstrates low resistance arterial and venous waveforms bilaterally.  IMPRESSION: 1. Bilateral varicoceles. 2. Benign appearing 11 mm cyst at the tail of the right epididymis. 3. Normal appearance of the testicles bilaterally. 4. Normal color Doppler signal and waveform analysis.   Electronically Signed   By: Lawrence Santiago M.D.   On: 10/24/2013 14:01     EKG Interpretation   Date/Time:  Sunday Oct 24 2013 10:56:38 EDT Ventricular Rate:  78 PR Interval:  164 QRS Duration: 127 QT Interval:  415 QTC Calculation: 473 R Axis:   -13 Text Interpretation:  Sinus rhythm Probable left atrial enlargement Right  bundle branch block No significant change was found Confirmed by DOCHERTY   MD, MEGAN ([id number]) on 10/24/2013 11:04:07 AM      MDM   Final diagnoses:  Dysuria  Scrotal pain  Paresthesias    Pt is a 73 y.o. male with Pmhx as above who presents with 2-3 days of pins/needle  sensation in all fingers of BL hands w/o weakness, neck or back pain, as well as dysuria w/o hematuria or penile d/c, and BL scrotal pain. GU with dribbling of urine, but no purulent d/c, and BL ttp to scrotum w/o focal tenderness, palpable or visual abnormality. +sexually active. Denies pain w/ BM, fevers, n/v, d/a. Urine w/ trace Hb but not infected. Scrotal US done w/ BL varicoceles, benign R epididymal cyst. Given dysuria and recent sexual activity with nml [place], will treat presumtively for urethritis and cover for GC/Chlam. I believe paresthesias likely due to DM  given glove distribution. Pt will f/u with PCP closely for both issues. Return precautions given for new or worsening symptoms including worsening pain, fever,.          Neta Ehlers, MD 10/25/13 [phone]

## 2013-10-25 LAB — URINE CULTURE
Colony Count: NO GROWTH
Culture: NO GROWTH

## 2013-10-27 ENCOUNTER — Other Ambulatory Visit (HOSPITAL_COMMUNITY): Payer: Self-pay | Admitting: General Surgery

## 2013-10-27 ENCOUNTER — Ambulatory Visit (HOSPITAL_COMMUNITY)
Admission: RE | Admit: 2013-10-27 | Discharge: 2013-10-27 | Disposition: A | Discharge status: Home / Self Care | Payer: Medicare HMO | Source: Ambulatory Visit | Attending: General Surgery | Admitting: General Surgery

## 2013-10-27 DIAGNOSIS — S8990XA Unspecified injury of unspecified lower leg, initial encounter: Secondary | ICD-10-CM | POA: Diagnosis not present

## 2013-10-27 DIAGNOSIS — E119 Type 2 diabetes mellitus without complications: Secondary | ICD-10-CM | POA: Diagnosis not present

## 2013-10-27 DIAGNOSIS — S99929A Unspecified injury of unspecified foot, initial encounter: Principal | ICD-10-CM

## 2013-10-27 DIAGNOSIS — M869 Osteomyelitis, unspecified: Secondary | ICD-10-CM

## 2013-10-27 DIAGNOSIS — X58XXXA Exposure to other specified factors, initial encounter: Secondary | ICD-10-CM | POA: Insufficient documentation

## 2013-10-27 DIAGNOSIS — M2140 Flat foot [pes planus] (acquired), unspecified foot: Secondary | ICD-10-CM | POA: Diagnosis not present

## 2013-10-27 DIAGNOSIS — Y929 Unspecified place or not applicable: Secondary | ICD-10-CM | POA: Insufficient documentation

## 2013-10-27 DIAGNOSIS — S99919A Unspecified injury of unspecified ankle, initial encounter: Principal | ICD-10-CM

## 2013-10-27 LAB — GLUCOSE, CAPILLARY: Glucose-Capillary: 97 mg/dL (ref 70–99)

## 2013-11-01 LAB — GLUCOSE, CAPILLARY: Glucose-Capillary: 176 mg/dL — ABNORMAL HIGH (ref 70–99)

## 2013-11-03 LAB — GLUCOSE, CAPILLARY: Glucose-Capillary: 170 mg/dL — ABNORMAL HIGH (ref 70–99)

## 2013-11-17 ENCOUNTER — Encounter (HOSPITAL_BASED_OUTPATIENT_CLINIC_OR_DEPARTMENT_OTHER): Payer: Medicare HMO | Attending: General Surgery

## 2013-11-17 DIAGNOSIS — L97509 Non-pressure chronic ulcer of other part of unspecified foot with unspecified severity: Secondary | ICD-10-CM | POA: Insufficient documentation

## 2013-11-17 DIAGNOSIS — E1169 Type 2 diabetes mellitus with other specified complication: Secondary | ICD-10-CM | POA: Diagnosis present

## 2013-11-17 LAB — GLUCOSE, CAPILLARY: Glucose-Capillary: 166 mg/dL — ABNORMAL HIGH (ref 70–99)

## 2013-11-24 DIAGNOSIS — E1169 Type 2 diabetes mellitus with other specified complication: Secondary | ICD-10-CM | POA: Diagnosis not present

## 2013-11-24 DIAGNOSIS — L97509 Non-pressure chronic ulcer of other part of unspecified foot with unspecified severity: Secondary | ICD-10-CM | POA: Diagnosis not present

## 2013-11-24 LAB — GLUCOSE, CAPILLARY: Glucose-Capillary: 140 mg/dL — ABNORMAL HIGH (ref 70–99)

## 2013-12-01 DIAGNOSIS — L97509 Non-pressure chronic ulcer of other part of unspecified foot with unspecified severity: Secondary | ICD-10-CM | POA: Diagnosis not present

## 2013-12-01 DIAGNOSIS — E1169 Type 2 diabetes mellitus with other specified complication: Secondary | ICD-10-CM | POA: Diagnosis not present

## 2013-12-08 DIAGNOSIS — L97509 Non-pressure chronic ulcer of other part of unspecified foot with unspecified severity: Secondary | ICD-10-CM | POA: Diagnosis not present

## 2013-12-08 DIAGNOSIS — E1169 Type 2 diabetes mellitus with other specified complication: Secondary | ICD-10-CM | POA: Diagnosis not present

## 2013-12-08 LAB — GLUCOSE, CAPILLARY: Glucose-Capillary: 214 mg/dL — ABNORMAL HIGH (ref 70–99)

## 2013-12-15 ENCOUNTER — Encounter (HOSPITAL_BASED_OUTPATIENT_CLINIC_OR_DEPARTMENT_OTHER): Payer: Medicare HMO | Attending: General Surgery

## 2013-12-15 DIAGNOSIS — L97509 Non-pressure chronic ulcer of other part of unspecified foot with unspecified severity: Secondary | ICD-10-CM | POA: Diagnosis not present

## 2013-12-15 DIAGNOSIS — E1169 Type 2 diabetes mellitus with other specified complication: Secondary | ICD-10-CM | POA: Insufficient documentation

## 2013-12-15 LAB — GLUCOSE, CAPILLARY: Glucose-Capillary: 152 mg/dL — ABNORMAL HIGH (ref 70–99)

## 2013-12-22 ENCOUNTER — Other Ambulatory Visit (HOSPITAL_COMMUNITY): Payer: Self-pay | Admitting: General Surgery

## 2013-12-22 ENCOUNTER — Ambulatory Visit (HOSPITAL_COMMUNITY)
Admission: RE | Admit: 2013-12-22 | Discharge: 2013-12-22 | Disposition: A | Discharge status: Home / Self Care | Payer: Medicare HMO | Source: Ambulatory Visit | Attending: General Surgery | Admitting: General Surgery

## 2013-12-22 DIAGNOSIS — E1169 Type 2 diabetes mellitus with other specified complication: Secondary | ICD-10-CM | POA: Diagnosis not present

## 2013-12-22 DIAGNOSIS — M899 Disorder of bone, unspecified: Secondary | ICD-10-CM | POA: Diagnosis not present

## 2013-12-22 DIAGNOSIS — M869 Osteomyelitis, unspecified: Secondary | ICD-10-CM

## 2013-12-22 DIAGNOSIS — M2140 Flat foot [pes planus] (acquired), unspecified foot: Secondary | ICD-10-CM | POA: Insufficient documentation

## 2013-12-22 DIAGNOSIS — M949 Disorder of cartilage, unspecified: Secondary | ICD-10-CM

## 2013-12-22 DIAGNOSIS — L97509 Non-pressure chronic ulcer of other part of unspecified foot with unspecified severity: Secondary | ICD-10-CM | POA: Diagnosis not present

## 2013-12-23 LAB — GLUCOSE, CAPILLARY: Glucose-Capillary: 162 mg/dL — ABNORMAL HIGH (ref 70–99)

## 2013-12-24 DIAGNOSIS — L97509 Non-pressure chronic ulcer of other part of unspecified foot with unspecified severity: Secondary | ICD-10-CM | POA: Diagnosis not present

## 2013-12-24 DIAGNOSIS — E1169 Type 2 diabetes mellitus with other specified complication: Secondary | ICD-10-CM | POA: Diagnosis not present

## 2013-12-24 LAB — GLUCOSE, CAPILLARY: Glucose-Capillary: 224 mg/dL — ABNORMAL HIGH (ref 70–99)

## 2013-12-27 DIAGNOSIS — E1169 Type 2 diabetes mellitus with other specified complication: Secondary | ICD-10-CM | POA: Diagnosis not present

## 2013-12-27 DIAGNOSIS — L97509 Non-pressure chronic ulcer of other part of unspecified foot with unspecified severity: Secondary | ICD-10-CM | POA: Diagnosis not present

## 2013-12-27 LAB — GLUCOSE, CAPILLARY: Glucose-Capillary: 178 mg/dL — ABNORMAL HIGH (ref 70–99)

## 2013-12-29 DIAGNOSIS — L97509 Non-pressure chronic ulcer of other part of unspecified foot with unspecified severity: Secondary | ICD-10-CM | POA: Diagnosis not present

## 2013-12-29 DIAGNOSIS — E1169 Type 2 diabetes mellitus with other specified complication: Secondary | ICD-10-CM | POA: Diagnosis not present

## 2013-12-29 LAB — GLUCOSE, CAPILLARY: Glucose-Capillary: 245 mg/dL — ABNORMAL HIGH (ref 70–99)

## 2014-01-05 DIAGNOSIS — E1169 Type 2 diabetes mellitus with other specified complication: Secondary | ICD-10-CM | POA: Diagnosis not present

## 2014-01-05 DIAGNOSIS — L97509 Non-pressure chronic ulcer of other part of unspecified foot with unspecified severity: Secondary | ICD-10-CM | POA: Diagnosis not present

## 2014-01-05 LAB — GLUCOSE, CAPILLARY: Glucose-Capillary: 136 mg/dL — ABNORMAL HIGH (ref 70–99)

## 2014-01-12 DIAGNOSIS — L97509 Non-pressure chronic ulcer of other part of unspecified foot with unspecified severity: Secondary | ICD-10-CM | POA: Diagnosis not present

## 2014-01-12 DIAGNOSIS — E1169 Type 2 diabetes mellitus with other specified complication: Secondary | ICD-10-CM | POA: Diagnosis not present

## 2014-01-19 ENCOUNTER — Encounter (HOSPITAL_BASED_OUTPATIENT_CLINIC_OR_DEPARTMENT_OTHER): Payer: Medicare HMO | Attending: General Surgery

## 2014-01-19 DIAGNOSIS — L97509 Non-pressure chronic ulcer of other part of unspecified foot with unspecified severity: Secondary | ICD-10-CM | POA: Insufficient documentation

## 2014-01-19 DIAGNOSIS — E1169 Type 2 diabetes mellitus with other specified complication: Secondary | ICD-10-CM | POA: Insufficient documentation

## 2014-01-19 LAB — GLUCOSE, CAPILLARY: Glucose-Capillary: 194 mg/dL — ABNORMAL HIGH (ref 70–99)

## 2014-01-26 DIAGNOSIS — L97509 Non-pressure chronic ulcer of other part of unspecified foot with unspecified severity: Secondary | ICD-10-CM | POA: Diagnosis not present

## 2014-01-26 DIAGNOSIS — E1169 Type 2 diabetes mellitus with other specified complication: Secondary | ICD-10-CM | POA: Diagnosis not present

## 2014-01-26 LAB — GLUCOSE, CAPILLARY: Glucose-Capillary: 179 mg/dL — ABNORMAL HIGH (ref 70–99)

## 2014-02-02 DIAGNOSIS — E1169 Type 2 diabetes mellitus with other specified complication: Secondary | ICD-10-CM | POA: Diagnosis not present

## 2014-02-02 DIAGNOSIS — L97509 Non-pressure chronic ulcer of other part of unspecified foot with unspecified severity: Secondary | ICD-10-CM | POA: Diagnosis not present

## 2014-02-02 LAB — GLUCOSE, CAPILLARY: Glucose-Capillary: 139 mg/dL — ABNORMAL HIGH (ref 70–99)

## 2014-02-09 DIAGNOSIS — L97509 Non-pressure chronic ulcer of other part of unspecified foot with unspecified severity: Secondary | ICD-10-CM | POA: Diagnosis not present

## 2014-02-09 DIAGNOSIS — E1169 Type 2 diabetes mellitus with other specified complication: Secondary | ICD-10-CM | POA: Diagnosis not present

## 2014-02-16 ENCOUNTER — Encounter (HOSPITAL_BASED_OUTPATIENT_CLINIC_OR_DEPARTMENT_OTHER): Payer: Medicare HMO | Attending: General Surgery

## 2014-02-16 DIAGNOSIS — E1169 Type 2 diabetes mellitus with other specified complication: Secondary | ICD-10-CM | POA: Diagnosis present

## 2014-02-16 DIAGNOSIS — L97509 Non-pressure chronic ulcer of other part of unspecified foot with unspecified severity: Secondary | ICD-10-CM | POA: Diagnosis not present

## 2014-02-23 DIAGNOSIS — L97509 Non-pressure chronic ulcer of other part of unspecified foot with unspecified severity: Secondary | ICD-10-CM | POA: Diagnosis not present

## 2014-02-23 DIAGNOSIS — E1169 Type 2 diabetes mellitus with other specified complication: Secondary | ICD-10-CM | POA: Diagnosis not present

## 2014-03-07 ENCOUNTER — Other Ambulatory Visit: Payer: Self-pay | Admitting: Gastroenterology

## 2014-04-13 ENCOUNTER — Ambulatory Visit (HOSPITAL_COMMUNITY)
Admission: RE | Admit: 2014-04-13 | Discharge: 2014-04-13 | Disposition: A | Discharge status: Home / Self Care | Payer: Medicare HMO | Source: Ambulatory Visit | Attending: General Surgery | Admitting: General Surgery

## 2014-04-13 ENCOUNTER — Encounter (HOSPITAL_BASED_OUTPATIENT_CLINIC_OR_DEPARTMENT_OTHER): Payer: Medicare HMO | Attending: General Surgery

## 2014-04-13 ENCOUNTER — Other Ambulatory Visit (HOSPITAL_COMMUNITY): Payer: Self-pay | Admitting: General Surgery

## 2014-04-13 DIAGNOSIS — M869 Osteomyelitis, unspecified: Secondary | ICD-10-CM

## 2014-04-13 DIAGNOSIS — E11621 Type 2 diabetes mellitus with foot ulcer: Secondary | ICD-10-CM | POA: Diagnosis present

## 2014-04-13 DIAGNOSIS — L97529 Non-pressure chronic ulcer of other part of left foot with unspecified severity: Secondary | ICD-10-CM | POA: Insufficient documentation

## 2014-04-14 NOTE — Progress Notes (Signed)
[facility]  NAME:  [name], [name] NO.:  [id number]  MEDICAL RECORD NO.:  [id number]      DATE OF BIRTH:  1940-09-17  PHYSICIAN:  Judene Companion, M.D.           VISIT DATE:                                  OFFICE VISIT   A 73 year old African American male who has type 2 diabetes.  He has been coming here off and on for years because of a diabetic ulcer on the plantar aspect of his left foot.  I have classified in this ulcer in the past as a Wagner 2 because the x-ray never showed any osteo, but this ulcers about 1 cm in diameter.  It has a lot of callus and some necrotic tissue in the ulcer and I am going to re-x-ray it and also culture it and today, we will treat it with silver alginate and see what the culture shows and the x-ray for further treatment.  Today, I put on some offloading felt and we put some silver alginate over the ulcer itself. His blood pressure today was 127/68, respirations 20, pulse 68, temperature 98.  He weighs 200 pounds.  His medicines include lisinopril.  He also takes metformin, glipizide, gabapentin, and B12. So, his diagnosis diabetic foot ulcer, right now Wagner 2, plantar aspect, left foot over the fourth MP area.  Other diagnoses are type 2 diabetes and hypertension.     Judene Companion, M.D.     PP/MEDQ  D:  04/13/2014  T:  04/14/2014  Job:  [id number]

## 2014-04-20 ENCOUNTER — Encounter (HOSPITAL_BASED_OUTPATIENT_CLINIC_OR_DEPARTMENT_OTHER): Payer: Commercial Managed Care - HMO | Attending: General Surgery

## 2014-04-20 DIAGNOSIS — E11621 Type 2 diabetes mellitus with foot ulcer: Secondary | ICD-10-CM | POA: Diagnosis present

## 2014-04-20 DIAGNOSIS — L97429 Non-pressure chronic ulcer of left heel and midfoot with unspecified severity: Secondary | ICD-10-CM | POA: Insufficient documentation

## 2014-04-27 DIAGNOSIS — L97429 Non-pressure chronic ulcer of left heel and midfoot with unspecified severity: Secondary | ICD-10-CM | POA: Diagnosis not present

## 2014-04-27 DIAGNOSIS — E11621 Type 2 diabetes mellitus with foot ulcer: Secondary | ICD-10-CM | POA: Diagnosis not present

## 2014-05-04 DIAGNOSIS — L97429 Non-pressure chronic ulcer of left heel and midfoot with unspecified severity: Secondary | ICD-10-CM | POA: Diagnosis not present

## 2014-05-04 DIAGNOSIS — E11621 Type 2 diabetes mellitus with foot ulcer: Secondary | ICD-10-CM | POA: Diagnosis not present

## 2014-05-06 ENCOUNTER — Encounter (HOSPITAL_COMMUNITY): Payer: Self-pay | Admitting: *Deleted

## 2014-05-23 ENCOUNTER — Ambulatory Visit (HOSPITAL_COMMUNITY): Admission: RE | Admit: 2014-05-23 | Payer: Medicare HMO | Source: Ambulatory Visit | Admitting: Gastroenterology

## 2014-05-23 SURGERY — COLONOSCOPY WITH PROPOFOL
Anesthesia: Monitor Anesthesia Care

## 2014-06-24 ENCOUNTER — Encounter (HOSPITAL_BASED_OUTPATIENT_CLINIC_OR_DEPARTMENT_OTHER): Payer: Medicare HMO | Attending: Internal Medicine

## 2014-06-24 ENCOUNTER — Other Ambulatory Visit: Payer: Self-pay | Admitting: Internal Medicine

## 2014-06-24 ENCOUNTER — Ambulatory Visit (HOSPITAL_COMMUNITY)
Admission: RE | Admit: 2014-06-24 | Discharge: 2014-06-24 | Disposition: A | Discharge status: Home / Self Care | Payer: Medicare HMO | Source: Ambulatory Visit | Attending: Internal Medicine | Admitting: Internal Medicine

## 2014-06-24 DIAGNOSIS — M869 Osteomyelitis, unspecified: Secondary | ICD-10-CM | POA: Insufficient documentation

## 2014-06-24 DIAGNOSIS — E11610 Type 2 diabetes mellitus with diabetic neuropathic arthropathy: Secondary | ICD-10-CM | POA: Insufficient documentation

## 2014-06-24 DIAGNOSIS — L97421 Non-pressure chronic ulcer of left heel and midfoot limited to breakdown of skin: Secondary | ICD-10-CM | POA: Insufficient documentation

## 2014-06-24 DIAGNOSIS — E11621 Type 2 diabetes mellitus with foot ulcer: Secondary | ICD-10-CM | POA: Diagnosis present

## 2014-06-27 DIAGNOSIS — E11621 Type 2 diabetes mellitus with foot ulcer: Secondary | ICD-10-CM | POA: Diagnosis not present

## 2014-07-01 DIAGNOSIS — L97421 Non-pressure chronic ulcer of left heel and midfoot limited to breakdown of skin: Secondary | ICD-10-CM | POA: Diagnosis not present

## 2014-07-01 DIAGNOSIS — E11610 Type 2 diabetes mellitus with diabetic neuropathic arthropathy: Secondary | ICD-10-CM | POA: Diagnosis not present

## 2014-07-01 DIAGNOSIS — E11621 Type 2 diabetes mellitus with foot ulcer: Secondary | ICD-10-CM | POA: Diagnosis not present

## 2014-07-05 DIAGNOSIS — E11610 Type 2 diabetes mellitus with diabetic neuropathic arthropathy: Secondary | ICD-10-CM | POA: Diagnosis not present

## 2014-07-05 DIAGNOSIS — E11621 Type 2 diabetes mellitus with foot ulcer: Secondary | ICD-10-CM | POA: Diagnosis not present

## 2014-07-05 DIAGNOSIS — L97421 Non-pressure chronic ulcer of left heel and midfoot limited to breakdown of skin: Secondary | ICD-10-CM | POA: Diagnosis not present

## 2014-07-05 LAB — GLUCOSE, CAPILLARY: Glucose-Capillary: 248 mg/dL — ABNORMAL HIGH (ref 70–99)

## 2014-07-15 DIAGNOSIS — E11621 Type 2 diabetes mellitus with foot ulcer: Secondary | ICD-10-CM | POA: Diagnosis not present

## 2014-07-15 DIAGNOSIS — E11610 Type 2 diabetes mellitus with diabetic neuropathic arthropathy: Secondary | ICD-10-CM | POA: Diagnosis not present

## 2014-07-15 DIAGNOSIS — L97421 Non-pressure chronic ulcer of left heel and midfoot limited to breakdown of skin: Secondary | ICD-10-CM | POA: Diagnosis not present

## 2014-07-15 LAB — GLUCOSE, CAPILLARY: Glucose-Capillary: 158 mg/dL — ABNORMAL HIGH (ref 70–99)

## 2014-07-22 ENCOUNTER — Encounter (HOSPITAL_BASED_OUTPATIENT_CLINIC_OR_DEPARTMENT_OTHER): Payer: PPO | Attending: Internal Medicine

## 2014-07-22 DIAGNOSIS — L97522 Non-pressure chronic ulcer of other part of left foot with fat layer exposed: Secondary | ICD-10-CM | POA: Diagnosis not present

## 2014-07-22 DIAGNOSIS — L97421 Non-pressure chronic ulcer of left heel and midfoot limited to breakdown of skin: Secondary | ICD-10-CM | POA: Diagnosis not present

## 2014-07-22 DIAGNOSIS — E11621 Type 2 diabetes mellitus with foot ulcer: Secondary | ICD-10-CM | POA: Diagnosis present

## 2014-07-22 LAB — GLUCOSE, CAPILLARY: Glucose-Capillary: 168 mg/dL — ABNORMAL HIGH (ref 70–99)

## 2014-08-02 ENCOUNTER — Other Ambulatory Visit (HOSPITAL_BASED_OUTPATIENT_CLINIC_OR_DEPARTMENT_OTHER): Payer: Self-pay | Admitting: General Surgery

## 2014-08-02 DIAGNOSIS — L97509 Non-pressure chronic ulcer of other part of unspecified foot with unspecified severity: Principal | ICD-10-CM

## 2014-08-02 DIAGNOSIS — E1169 Type 2 diabetes mellitus with other specified complication: Principal | ICD-10-CM

## 2014-08-02 DIAGNOSIS — L97522 Non-pressure chronic ulcer of other part of left foot with fat layer exposed: Secondary | ICD-10-CM | POA: Diagnosis not present

## 2014-08-02 DIAGNOSIS — E11621 Type 2 diabetes mellitus with foot ulcer: Secondary | ICD-10-CM | POA: Diagnosis not present

## 2014-08-02 DIAGNOSIS — M869 Osteomyelitis, unspecified: Principal | ICD-10-CM

## 2014-08-02 DIAGNOSIS — L97421 Non-pressure chronic ulcer of left heel and midfoot limited to breakdown of skin: Secondary | ICD-10-CM | POA: Diagnosis not present

## 2014-08-10 DIAGNOSIS — E11621 Type 2 diabetes mellitus with foot ulcer: Secondary | ICD-10-CM | POA: Diagnosis not present

## 2014-08-12 ENCOUNTER — Ambulatory Visit (HOSPITAL_COMMUNITY)
Admission: RE | Admit: 2014-08-12 | Discharge: 2014-08-12 | Disposition: A | Discharge status: Home / Self Care | Payer: PPO | Source: Ambulatory Visit | Attending: General Surgery | Admitting: General Surgery

## 2014-08-12 ENCOUNTER — Other Ambulatory Visit (HOSPITAL_BASED_OUTPATIENT_CLINIC_OR_DEPARTMENT_OTHER): Payer: Self-pay | Admitting: General Surgery

## 2014-08-12 DIAGNOSIS — E11621 Type 2 diabetes mellitus with foot ulcer: Secondary | ICD-10-CM | POA: Diagnosis present

## 2014-08-12 DIAGNOSIS — E1169 Type 2 diabetes mellitus with other specified complication: Principal | ICD-10-CM

## 2014-08-12 DIAGNOSIS — M009 Pyogenic arthritis, unspecified: Secondary | ICD-10-CM | POA: Diagnosis not present

## 2014-08-12 DIAGNOSIS — L97509 Non-pressure chronic ulcer of other part of unspecified foot with unspecified severity: Secondary | ICD-10-CM

## 2014-08-12 DIAGNOSIS — M869 Osteomyelitis, unspecified: Principal | ICD-10-CM

## 2014-08-17 ENCOUNTER — Encounter (HOSPITAL_BASED_OUTPATIENT_CLINIC_OR_DEPARTMENT_OTHER): Payer: PPO | Attending: Surgery

## 2014-08-17 DIAGNOSIS — L97421 Non-pressure chronic ulcer of left heel and midfoot limited to breakdown of skin: Secondary | ICD-10-CM | POA: Diagnosis present

## 2014-08-17 DIAGNOSIS — B9689 Other specified bacterial agents as the cause of diseases classified elsewhere: Secondary | ICD-10-CM | POA: Insufficient documentation

## 2014-08-17 DIAGNOSIS — E11621 Type 2 diabetes mellitus with foot ulcer: Secondary | ICD-10-CM | POA: Insufficient documentation

## 2014-08-17 DIAGNOSIS — M868X7 Other osteomyelitis, ankle and foot: Secondary | ICD-10-CM | POA: Insufficient documentation

## 2014-08-17 DIAGNOSIS — F17290 Nicotine dependence, other tobacco product, uncomplicated: Secondary | ICD-10-CM | POA: Diagnosis not present

## 2014-08-23 ENCOUNTER — Telehealth: Payer: Self-pay | Admitting: *Deleted

## 2014-08-23 ENCOUNTER — Ambulatory Visit (INDEPENDENT_AMBULATORY_CARE_PROVIDER_SITE_OTHER): Payer: PPO | Admitting: Internal Medicine

## 2014-08-23 ENCOUNTER — Encounter: Payer: Self-pay | Admitting: Internal Medicine

## 2014-08-23 VITALS — BP 148/92 | HR 92 | Temp 98.7°F | Ht 73.0 in | Wt 216.0 lb

## 2014-08-23 DIAGNOSIS — M86172 Other acute osteomyelitis, left ankle and foot: Secondary | ICD-10-CM

## 2014-08-23 LAB — COMPLETE METABOLIC PANEL WITH GFR
ALT: 12 U/L (ref 0–53)
AST: 14 U/L (ref 0–37)
Albumin: 3.6 g/dL (ref 3.5–5.2)
Alkaline Phosphatase: 53 U/L (ref 39–117)
BUN: 12 mg/dL (ref 6–23)
CO2: 22 mEq/L (ref 19–32)
Calcium: 9.5 mg/dL (ref 8.4–10.5)
Chloride: 103 mEq/L (ref 96–112)
Creat: 1.08 mg/dL (ref 0.50–1.35)
GFR, Est African American: 79 mL/min
GFR, Est Non African American: 69 mL/min
Glucose, Bld: 131 mg/dL — ABNORMAL HIGH (ref 70–99)
Potassium: 4.6 mEq/L (ref 3.5–5.3)
Sodium: 139 mEq/L (ref 135–145)
Total Bilirubin: 0.4 mg/dL (ref 0.2–1.2)
Total Protein: 7.6 g/dL (ref 6.0–8.3)

## 2014-08-23 LAB — CBC WITH DIFFERENTIAL/PLATELET
Basophils Absolute: 0 10*3/uL (ref 0.0–0.1)
Basophils Relative: 0 % (ref 0–1)
Eosinophils Absolute: 0.7 10*3/uL (ref 0.0–0.7)
Eosinophils Relative: 10 % — ABNORMAL HIGH (ref 0–5)
HCT: 39.6 % (ref 39.0–52.0)
Hemoglobin: 13 g/dL (ref 13.0–17.0)
Lymphocytes Relative: 31 % (ref 12–46)
Lymphs Abs: 2.1 10*3/uL (ref 0.7–4.0)
MCH: 27.9 pg (ref 26.0–34.0)
MCHC: 32.8 g/dL (ref 30.0–36.0)
MCV: 85 fL (ref 78.0–100.0)
MPV: 9.1 fL (ref 8.6–12.4)
Monocytes Absolute: 0.5 10*3/uL (ref 0.1–1.0)
Monocytes Relative: 7 % (ref 3–12)
Neutro Abs: 3.5 10*3/uL (ref 1.7–7.7)
Neutrophils Relative %: 52 % (ref 43–77)
Platelets: 377 10*3/uL (ref 150–400)
RBC: 4.66 MIL/uL (ref 4.22–5.81)
RDW: 15.5 % (ref 11.5–15.5)
WBC: 6.8 10*3/uL (ref 4.0–10.5)

## 2014-08-23 LAB — C-REACTIVE PROTEIN: CRP: 2.3 mg/dL — ABNORMAL HIGH (ref <0.60)

## 2014-08-23 MED ORDER — VANCOMYCIN HCL 1000 MG IV SOLR: 1000.0000 mg | Freq: Two times a day (BID) | INTRAVENOUS | Status: DC

## 2014-08-23 MED ORDER — LEVOFLOXACIN 500 MG PO TABS: 500.0000 mg | ORAL_TABLET | Freq: Every day | ORAL | Status: DC

## 2014-08-23 NOTE — Progress Notes (Signed)
   Subjective:    Patient ID: [name], male    DOB: Nov 20, 1940, 74 y.o.   MRN: [id number]  HPI He comes in for evaluation as a new patient. He has a history of diabetes with the last hemoglobin A1c in system over a year ago of 11. He also has had a chronic wound of his left foot and nonhealing ulcer and recently due to increased pain and swelling of his metatarsals, he had an MRI which is consistent with septic arthritis and osteomyelitis of the third toe. He was sent here by wound care. He has not had any debridement or aspiration. He is felt some malaise over the last 2-3 weeks as well. No fever. Has never been on IV antibiotics. No allergies to medicines.     Review of Systems  Constitutional: Negative for fever, chills and fatigue.  Gastrointestinal: Negative for nausea and diarrhea.  Musculoskeletal:       Pain and swelling of left foot  Skin: Negative for rash.  Neurological: Negative for dizziness and light-headedness.  Hematological: Negative for adenopathy.       Objective:   Physical Exam  Constitutional: He appears well-developed and well-nourished. No distress.  Eyes: No scleral icterus.  Cardiovascular: Normal rate, regular rhythm and normal heart sounds.   No murmur heard. Pulmonary/Chest: Effort normal and breath sounds normal. No respiratory distress. He has no wheezes.  Musculoskeletal:  Left foot with heel callus. Left second and third toe with some warmth and swelling at the metatarsal joint.  Skin: No rash noted.          Assessment & Plan:

## 2014-08-23 NOTE — Telephone Encounter (Signed)
Per Dr. Linus Salmons, pt scheduled for PICC placement 3/9 at 9:00 (arrive 8:30 at main entrance of [facility]). Demographics, office note and nursing/pharmacy orders faxed to [facility] for IV Vancomycin.  Confirmed with Debbie at [facility].  First dose to be administered at home.  Patient verbalized understanding and agreement. Landis Gandy, RN

## 2014-08-23 NOTE — Assessment & Plan Note (Signed)
I will check his labs today including a sedimentation rate and CRP. I also will start him on empiric antibiotics with vancomycin and Levaquin. I will use 500 mg of Levaquin due to his mild renal insufficiency though his GFR is about 50. I will also see him in about 2 weeks to see if he is having some improvement. If he continues to have swelling and pain there, I will consider referral to orthopedics for debridement. I also may consider vascular studies or ABI at least prior to that. I also will check his hemoglobin A1c today to see if he has poorly controlled diabetes. Was discussed with the patient. 60 minutes spent including 30 minutes with the patient in counseling and exam and discussion of antibiotics.

## 2014-08-24 ENCOUNTER — Ambulatory Visit (HOSPITAL_COMMUNITY)
Admission: RE | Admit: 2014-08-24 | Discharge: 2014-08-24 | Disposition: A | Discharge status: Home / Self Care | Payer: PPO | Source: Ambulatory Visit | Attending: Interventional Radiology | Admitting: Interventional Radiology

## 2014-08-24 ENCOUNTER — Telehealth: Payer: Self-pay | Admitting: Licensed Clinical Social Worker

## 2014-08-24 ENCOUNTER — Other Ambulatory Visit: Payer: Self-pay | Admitting: Internal Medicine

## 2014-08-24 DIAGNOSIS — M86172 Other acute osteomyelitis, left ankle and foot: Secondary | ICD-10-CM

## 2014-08-24 LAB — HEMOGLOBIN A1C
Hgb A1c MFr Bld: 8.1 % — ABNORMAL HIGH (ref <5.7)
Mean Plasma Glucose: 186 mg/dL — ABNORMAL HIGH (ref <117)

## 2014-08-24 LAB — SEDIMENTATION RATE: Sed Rate: 28 mm/hr — ABNORMAL HIGH (ref 0–20)

## 2014-08-24 MED ORDER — HEPARIN SOD (PORK) LOCK FLUSH 100 UNIT/ML IV SOLN
INTRAVENOUS | Status: AC
  Filled 2014-08-24: qty 5

## 2014-08-24 MED ORDER — LIDOCAINE HCL 1 % IJ SOLN
INTRAMUSCULAR | Status: AC
  Filled 2014-08-24: qty 20

## 2014-08-24 NOTE — Telephone Encounter (Signed)
Patient had PICC placed today, RN went to the home to start care. Per RN patient stated that he could not give the medication to himself and did not have a caretaker. Per RN she will go out for a few days more to help teach him, if he is still unable then [facility] will need to find a different plan for administering medication.

## 2014-08-24 NOTE — Procedures (Signed)
R arm PowerPICC placed under US and fluoroscopy No ptx on spot chest radiograph. No complication No blood loss. See complete dictation in [facility] PACS.  

## 2014-08-25 ENCOUNTER — Encounter (HOSPITAL_COMMUNITY): Payer: Self-pay | Admitting: Emergency Medicine

## 2014-08-25 ENCOUNTER — Emergency Department (HOSPITAL_COMMUNITY)
Admission: EM | Admit: 2014-08-25 | Discharge: 2014-08-26 | Disposition: A | Discharge status: Home / Self Care | Payer: PPO | Attending: Emergency Medicine | Admitting: Emergency Medicine

## 2014-08-25 DIAGNOSIS — E119 Type 2 diabetes mellitus without complications: Secondary | ICD-10-CM | POA: Insufficient documentation

## 2014-08-25 DIAGNOSIS — R112 Nausea with vomiting, unspecified: Secondary | ICD-10-CM | POA: Insufficient documentation

## 2014-08-25 DIAGNOSIS — R7989 Other specified abnormal findings of blood chemistry: Secondary | ICD-10-CM

## 2014-08-25 DIAGNOSIS — M1990 Unspecified osteoarthritis, unspecified site: Secondary | ICD-10-CM | POA: Diagnosis not present

## 2014-08-25 DIAGNOSIS — Z79899 Other long term (current) drug therapy: Secondary | ICD-10-CM | POA: Insufficient documentation

## 2014-08-25 DIAGNOSIS — Z720 Tobacco use: Secondary | ICD-10-CM | POA: Insufficient documentation

## 2014-08-25 DIAGNOSIS — Z792 Long term (current) use of antibiotics: Secondary | ICD-10-CM | POA: Insufficient documentation

## 2014-08-25 DIAGNOSIS — R109 Unspecified abdominal pain: Secondary | ICD-10-CM | POA: Diagnosis not present

## 2014-08-25 DIAGNOSIS — I10 Essential (primary) hypertension: Secondary | ICD-10-CM | POA: Diagnosis not present

## 2014-08-25 LAB — CBC WITH DIFFERENTIAL/PLATELET
Basophils Absolute: 0 10*3/uL (ref 0.0–0.1)
Basophils Relative: 0 % (ref 0–1)
Eosinophils Absolute: 0.6 10*3/uL (ref 0.0–0.7)
Eosinophils Relative: 5 % (ref 0–5)
HCT: 37.5 % — ABNORMAL LOW (ref 39.0–52.0)
Hemoglobin: 12.2 g/dL — ABNORMAL LOW (ref 13.0–17.0)
Lymphocytes Relative: 6 % — ABNORMAL LOW (ref 12–46)
Lymphs Abs: 0.8 10*3/uL (ref 0.7–4.0)
MCH: 28.3 pg (ref 26.0–34.0)
MCHC: 32.5 g/dL (ref 30.0–36.0)
MCV: 87 fL (ref 78.0–100.0)
Monocytes Absolute: 0.8 10*3/uL (ref 0.1–1.0)
Monocytes Relative: 6 % (ref 3–12)
Neutro Abs: 11.1 10*3/uL — ABNORMAL HIGH (ref 1.7–7.7)
Neutrophils Relative %: 83 % — ABNORMAL HIGH (ref 43–77)
Platelets: 272 10*3/uL (ref 150–400)
RBC: 4.31 MIL/uL (ref 4.22–5.81)
RDW: 15.1 % (ref 11.5–15.5)
WBC: 13.3 10*3/uL — ABNORMAL HIGH (ref 4.0–10.5)

## 2014-08-25 LAB — I-STAT CG4 LACTIC ACID, ED: Lactic Acid, Venous: 0.97 mmol/L (ref 0.5–2.0)

## 2014-08-25 MED ORDER — ONDANSETRON 4 MG PO TBDP: 4.0000 mg | ORAL_TABLET | Freq: Three times a day (TID) | ORAL | Status: DC | PRN

## 2014-08-25 MED ORDER — SODIUM CHLORIDE 0.9 % IV BOLUS (SEPSIS)
1000.0000 mL | Freq: Once | INTRAVENOUS | Status: AC
  Administered 2014-08-25: 1000 mL via INTRAVENOUS

## 2014-08-25 MED ORDER — ONDANSETRON HCL 4 MG/2ML IJ SOLN
4.0000 mg | Freq: Once | INTRAMUSCULAR | Status: AC
  Administered 2014-08-25: 4 mg via INTRAVENOUS
  Filled 2014-08-25: qty 2

## 2014-08-25 NOTE — ED Provider Notes (Signed)
CSN: [id number]     Arrival date & time 08/25/14  2042 History   First MD Initiated Contact with Patient 08/25/14 2126     Chief Complaint  Patient presents with  . Abdominal Pain     (Consider location/radiation/quality/duration/timing/severity/associated sxs/prior Treatment) HPI Patient presents with concern of an episode of nausea, vomiting, abdominal pain. Patient was reasonably well prior to the episode which occurred several hours prior to ED arrival. The abdominal pain was epigastric, sore, moderate. On my exam the patient states that he feels generally better, is not nauseous, continues to have pain in the epigastrium, moderate, sharp, crampy. He denies fever, chills. Notably, the patient started IV antibiotics yesterday for a infected nonhealing ulcer of the left foot, now complicated by osteomyelitis.. Patient has had ulcer 4 years, though was diagnosed with progressive infection recently. Patient receives IV antibiotics through a PICC line on the right side. He denies other current concerns, including chest pain, headache, confusion, disorientation.  Past Medical History  Diagnosis Date  . Osteomyelitis     Right great toe  . Hypertension   . Arthritis   . Peripheral vascular disease     diabetic  with osteomylitis  . High cholesterol   . Type II diabetes mellitus    Past Surgical History  Procedure Laterality Date  . Incision and drainage of wound Left 2006    "foot" (10/12/2012)  . Circumcision  2010  . Foot fracture surgery Left 1970's    "broke it playing football" (10/12/2012)  . Humerus fracture surgery w/ implant Right 1960's    "put a plate in it" (5/99/3570)  . Amputation  07/23/2011    Procedure: AMPUTATION DIGIT;  Surgeon: Newt Minion, MD;  Location: [place];  Service: Orthopedics;  Laterality: Right;  Right Great Toe Amputation  . Amputation Right 09/18/2012    Procedure: Right Foot 2nd Ray Amputation;  Surgeon: Newt Minion, MD;  Location: [place];   Service: Orthopedics;  Laterality: Right;  Right Foot 2nd Ray Amputation  . Lumbar disc surgery  2008  . Amputation Right 10/14/2012    Procedure: AMPUTATION FOOT;  Surgeon: Newt Minion, MD;  Location: [place];  Service: Orthopedics;  Laterality: Right;  Right Foot Transmetatarsal Amputation  . Back surgery      lower   Family History  Problem Relation Age of Onset  . Anesthesia problems Neg Hx   . Diabetes Mellitus II Other    History  Substance Use Topics  . Smoking status: Current Every Day Smoker -- 12 years    Types: Pipe  . Smokeless tobacco: Never Used     Comment: 10/12/2012 offered smoking cessation materials; pt declines  . Alcohol Use: No    Review of Systems  Constitutional:       Per HPI, otherwise negative  HENT:       Per HPI, otherwise negative  Respiratory:       Per HPI, otherwise negative  Cardiovascular:       Per HPI, otherwise negative  Gastrointestinal: Positive for nausea and vomiting.  Endocrine:       Negative aside from HPI  Genitourinary:       Neg aside from HPI   Musculoskeletal:       Per HPI, otherwise negative  Skin: Negative.   Neurological: Negative for syncope.      Allergies  Review of patient's allergies indicates no known allergies.  Home Medications   Prior to Admission medications   Medication Sig  Start Date End Date Taking? Authorizing Provider  Cyanocobalamin (VITAMIN B-12 PO) Take 1 tablet by mouth daily.   Yes Historical Provider, MD  gabapentin (NEURONTIN) 100 MG capsule Take 100 mg by mouth 2 (two) times daily.    Yes Historical Provider, MD  glipiZIDE (GLUCOTROL XL) 5 MG 24 hr tablet Take 5 mg by mouth 2 (two) times daily.    Yes Historical Provider, MD  HYDROcodone-acetaminophen (NORCO/VICODIN) 5-325 MG per tablet Take 1 tablet by mouth every 4 (four) hours as needed for moderate pain or severe pain (pain).    Yes Historical Provider, MD  levofloxacin (LEVAQUIN) 500 MG tablet Take 1 tablet (500 mg total) by mouth  daily. 08/23/14  Yes Thayer Headings, MD  lisinopril (PRINIVIL,ZESTRIL) 20 MG tablet Take 20 mg by mouth every morning.    Yes Historical Provider, MD  metFORMIN (GLUCOPHAGE) 1000 MG tablet Take 1,000 mg by mouth daily with breakfast.    Yes Historical Provider, MD  pyridOXINE (VITAMIN B-6) 100 MG tablet Take 100 mg by mouth daily.   Yes Historical Provider, MD  vancomycin 1,000 mg in sodium chloride 0.9 % 250 mL Inject 1,000 mg into the vein every 12 (twelve) hours. 08/23/14  Yes Thayer Headings, MD  acetaminophen (TYLENOL) 500 MG tablet Take 1,000 mg by mouth every 6 (six) hours as needed for mild pain.    Historical Provider, MD   BP 116/71 mmHg  Pulse 88  Temp(Src) 99 F (37.2 C) (Oral)  Resp 14  SpO2 100% Physical Exam  Constitutional: He is oriented to person, place, and time. He appears well-developed. No distress.  HENT:  Head: Normocephalic and atraumatic.  Eyes: Conjunctivae and EOM are normal.  Cardiovascular: Normal rate and regular rhythm.   Pulmonary/Chest: Effort normal. No stridor. No respiratory distress.  Abdominal: He exhibits no distension. There is no tenderness. There is no rebound and no guarding.  Musculoskeletal: He exhibits no edema.  Neurological: He is alert and oriented to person, place, and time.  Skin: Skin is warm and dry.     Psychiatric: He has a normal mood and affect.  Nursing note and vitals reviewed.   ED Course  Procedures (including critical care time) Labs Review Labs Reviewed  CBC WITH DIFFERENTIAL/PLATELET - Abnormal; Notable for the following:    WBC 13.3 (*)    Hemoglobin 12.2 (*)    HCT 37.5 (*)    Neutrophils Relative % 83 (*)    Neutro Abs 11.1 (*)    Lymphocytes Relative 6 (*)    All other components within normal limits  COMPREHENSIVE METABOLIC PANEL  I-STAT CG4 LACTIC ACID, ED    Imaging Review Ir Fluoro Guide Cv Line Right  08/24/2014   CLINICAL DATA:  Foot infection, needs venous access for antibiotics  EXAM: PICC PLACEMENT  WITH ULTRASOUND AND FLUOROSCOPY  FLUOROSCOPY TIME:  18 seconds  TECHNIQUE: After written informed consent was obtained, patient was placed in the supine position on angiographic table. Patency of the right basilic vein was confirmed with ultrasound with image documentation. An appropriate skin site was determined. Skin site was marked. Region was prepped using maximum barrier technique including cap and mask, sterile gown, sterile gloves, large sterile sheet, and Chlorhexidine as cutaneous antisepsis. The region was infiltrated locally with 1% lidocaine. Under real-time ultrasound guidance, the right basilic vein was accessed with a 21 gauge micropuncture needle; the needle tip within the vein was confirmed with ultrasound image documentation. Needle exchanged over a 018 guidewire for a  peel-away sheath, through which a 5-French Single-lumen power injectable PICC trimmed to 44 cm was advanced, positioned with its tip near the cavoatrial junction. Spot chest radiograph confirms appropriate catheter position. Catheter was flushed per protocol and secured externally. The patient tolerated procedure well.  COMPLICATIONS: COMPLICATIONS none  IMPRESSION: 1. Technically successful five French Single lumen power injectable PICC placement   Electronically Signed   By: Lucrezia Europe M.D.   On: 08/24/2014 14:51   Ir US Guide Vasc Access Right  08/24/2014   CLINICAL DATA:  Foot infection, needs venous access for antibiotics  EXAM: PICC PLACEMENT WITH ULTRASOUND AND FLUOROSCOPY  FLUOROSCOPY TIME:  18 seconds  TECHNIQUE: After written informed consent was obtained, patient was placed in the supine position on angiographic table. Patency of the right basilic vein was confirmed with ultrasound with image documentation. An appropriate skin site was determined. Skin site was marked. Region was prepped using maximum barrier technique including cap and mask, sterile gown, sterile gloves, large sterile sheet, and Chlorhexidine as  cutaneous antisepsis. The region was infiltrated locally with 1% lidocaine. Under real-time ultrasound guidance, the right basilic vein was accessed with a 21 gauge micropuncture needle; the needle tip within the vein was confirmed with ultrasound image documentation. Needle exchanged over a 018 guidewire for a peel-away sheath, through which a 5-French Single-lumen power injectable PICC trimmed to 44 cm was advanced, positioned with its tip near the cavoatrial junction. Spot chest radiograph confirms appropriate catheter position. Catheter was flushed per protocol and secured externally. The patient tolerated procedure well.  COMPLICATIONS: COMPLICATIONS none  IMPRESSION: 1. Technically successful five French Single lumen power injectable PICC placement   Electronically Signed   By: Lucrezia Europe M.D.   On: 08/24/2014 14:51    After the initial evaluation I reviewed the patient's chart, including recent infectious disease notes, requiring initiation of IV vancomycin for [place] to mellitus via PICC line.   On repeat exam the patient is in no distress. We discussed all findings, need follow-up with primary care.  12:23 AM Patient states that he feels good. He is aware of minor elevation in Creatinine.  He will discuss w PMD / ID tomorrow, and drink plenty of fluids in the interim.  MDM   Patient presents with an episode of nausea, vomiting, after recent initiation of aggressive therapy for osteomyelitis.  Patient has mild leukocytosis, but no substantial fever here. Patient's abdomen is soft, nontender, with low suspicion for acute intra-abdominal pathology. Patient may have mild bacteremia, but is currently on IV vancomycin. Patient improved here, with reassuring labs, requested discharge. Patient will follow-up with primary care.    Carmin Muskrat, MD 08/26/14 [phone]

## 2014-08-25 NOTE — ED Notes (Addendum)
Pt started on antibiotics yesterday for left foot infection and c/o abdominal pain, nausea, vomiting today.

## 2014-08-25 NOTE — ED Notes (Signed)
Bed: WA20 Expected date:  Expected time:  Means of arrival:  Comments: EMS 74 yo male abdominal pain/temp 100/vomiting

## 2014-08-26 LAB — COMPREHENSIVE METABOLIC PANEL
ALT: 18 U/L (ref 0–53)
AST: 22 U/L (ref 0–37)
Albumin: 3.5 g/dL (ref 3.5–5.2)
Alkaline Phosphatase: 52 U/L (ref 39–117)
Anion gap: 7 (ref 5–15)
BUN: 24 mg/dL — ABNORMAL HIGH (ref 6–23)
CO2: 22 mmol/L (ref 19–32)
Calcium: 8.2 mg/dL — ABNORMAL LOW (ref 8.4–10.5)
Chloride: 104 mmol/L (ref 96–112)
Creatinine, Ser: 1.71 mg/dL — ABNORMAL HIGH (ref 0.50–1.35)
GFR calc Af Amer: 45 mL/min — ABNORMAL LOW (ref >90)
GFR calc non Af Amer: 38 mL/min — ABNORMAL LOW (ref >90)
Glucose, Bld: 190 mg/dL — ABNORMAL HIGH (ref 70–99)
Potassium: 3.7 mmol/L (ref 3.5–5.1)
Sodium: 133 mmol/L — ABNORMAL LOW (ref 135–145)
Total Bilirubin: 0.9 mg/dL (ref 0.3–1.2)
Total Protein: 7.3 g/dL (ref 6.0–8.3)

## 2014-08-26 NOTE — Discharge Instructions (Signed)
As discussed, your evaluation today has been largely reassuring.  There is some elevated creatinine, which reflects either dehydration or effect of your vancomycin.  Please be sure to drink plenty of fluids.  It is important that you monitor your condition carefully, and do not hesitate to return to the ED if you develop new, or concerning changes in your condition.  Please follow-up with your physicians for appropriate ongoing care.   Nausea and Vomiting Nausea is a sick feeling that often comes before throwing up (vomiting). Vomiting is a reflex where stomach contents come out of your mouth. Vomiting can cause severe loss of body fluids (dehydration). Children and elderly adults can become dehydrated quickly, especially if they also have diarrhea. Nausea and vomiting are symptoms of a condition or disease. It is important to find the cause of your symptoms. CAUSES   Direct irritation of the stomach lining. This irritation can result from increased acid production (gastroesophageal reflux disease), infection, food poisoning, taking certain medicines (such as nonsteroidal anti-inflammatory drugs), alcohol use, or tobacco use.  Signals from the brain.These signals could be caused by a headache, heat exposure, an inner ear disturbance, increased pressure in the brain from injury, infection, a tumor, or a concussion, pain, emotional stimulus, or metabolic problems.  An obstruction in the gastrointestinal tract (bowel obstruction).  Illnesses such as diabetes, hepatitis, gallbladder problems, appendicitis, kidney problems, cancer, sepsis, atypical symptoms of a heart attack, or eating disorders.  Medical treatments such as chemotherapy and radiation.  Receiving medicine that makes you sleep (general anesthetic) during surgery. DIAGNOSIS Your caregiver may ask for tests to be done if the problems do not improve after a few days. Tests may also be done if symptoms are severe or if the reason for  the nausea and vomiting is not clear. Tests may include:  Urine tests.  Blood tests.  Stool tests.  Cultures (to look for evidence of infection).  X-rays or other imaging studies. Test results can help your caregiver make decisions about treatment or the need for additional tests. TREATMENT You need to stay well hydrated. Drink frequently but in small amounts.You may wish to drink water, sports drinks, clear broth, or eat frozen ice pops or gelatin dessert to help stay hydrated.When you eat, eating slowly may help prevent nausea.There are also some antinausea medicines that may help prevent nausea. HOME CARE INSTRUCTIONS   Take all medicine as directed by your caregiver.  If you do not have an appetite, do not force yourself to eat. However, you must continue to drink fluids.  If you have an appetite, eat a normal diet unless your caregiver tells you differently.  Eat a variety of complex carbohydrates (rice, wheat, potatoes, bread), lean meats, yogurt, fruits, and vegetables.  Avoid high-fat foods because they are more difficult to digest.  Drink enough water and fluids to keep your urine clear or pale yellow.  If you are dehydrated, ask your caregiver for specific rehydration instructions. Signs of dehydration may include:  Severe thirst.  Dry lips and mouth.  Dizziness.  Dark urine.  Decreasing urine frequency and amount.  Confusion.  Rapid breathing or pulse. SEEK IMMEDIATE MEDICAL CARE IF:   You have blood or brown flecks (like coffee grounds) in your vomit.  You have black or bloody stools.  You have a severe headache or stiff neck.  You are confused.  You have severe abdominal pain.  You have chest pain or trouble breathing.  You do not urinate at least once every  8 hours.  You develop cold or clammy skin.  You continue to vomit for longer than 24 to 48 hours.  You have a fever. MAKE SURE YOU:   Understand these instructions.  Will watch  your condition.  Will get help right away if you are not doing well or get worse. Document Released: 06/03/2005 Document Revised: 08/26/2011 Document Reviewed: 10/31/2010 [facility] Patient Information 2015 [place], [place]. This information is not intended to replace advice given to you by your health care provider. Make sure you discuss any questions you have with your health care provider.

## 2014-09-05 ENCOUNTER — Telehealth: Payer: Self-pay | Admitting: *Deleted

## 2014-09-05 NOTE — Telephone Encounter (Signed)
Patient having a lot of trouble with administering his own IV medication, handling the pump due to his decreased vision.  Patient contacts [facility] almost daily requiring home health visit to address his issues (anything from assistance administrating antibiotics to setting up the pump to identifying the cause of any number of alarms on the pump -- he is unable to read the machine).  Patient's vanc trough was elevated 24.2 3/19, but per Jeani Hawking and Manuela Schwartz, they feel it was that it was inaccurate as the patient was unable to stop his infusion at the proper time.  [facility] sent nursing out today to redraw the trough, but the nurse was unable to get drawback on the PICC and the peripheral venipuncture was unsuccessful. They will follow protocol and administer cathflow, will recheck vanc trough tomorrow.  Patient is to come to [facility] tomorrow 4:00 for 2 week follow up.  Per nursing, patient feels like his iv antibiotics will be stopped at this visit.  Please advise if you would like to change anything, and if he is still a candidate for iv antibiotics.  Landis Gandy, RN

## 2014-09-06 ENCOUNTER — Encounter: Payer: Self-pay | Admitting: Internal Medicine

## 2014-09-06 ENCOUNTER — Ambulatory Visit (INDEPENDENT_AMBULATORY_CARE_PROVIDER_SITE_OTHER): Payer: PPO | Admitting: Internal Medicine

## 2014-09-06 VITALS — BP 149/76 | HR 84 | Temp 99.8°F | Ht 73.0 in | Wt 212.0 lb

## 2014-09-06 DIAGNOSIS — M86172 Other acute osteomyelitis, left ankle and foot: Secondary | ICD-10-CM | POA: Diagnosis not present

## 2014-09-06 MED ORDER — DOXYCYCLINE HYCLATE 100 MG PO TABS: 100.0000 mg | ORAL_TABLET | Freq: Two times a day (BID) | ORAL | Status: DC

## 2014-09-06 MED ORDER — DOXYCYCLINE HYCLATE 100 MG PO TABS
100.0000 mg | ORAL_TABLET | Freq: Two times a day (BID) | ORAL | Status: DC
Start: 2014-09-06 — End: 2014-10-04

## 2014-09-06 MED ORDER — LEVOFLOXACIN 500 MG PO TABS: 500.0000 mg | ORAL_TABLET | Freq: Every day | ORAL | Status: DC

## 2014-09-06 NOTE — Addendum Note (Signed)
Addended by: Thayer Headings on: 09/06/2014 04:36 PM   Modules accepted: Orders

## 2014-09-06 NOTE — Telephone Encounter (Signed)
Patient refused vanc trough redraw 3/22.  He refused to let the nurse into his home.  Per Manuela Schwartz, the patient will be discharged if he continues to act this way.

## 2014-09-06 NOTE — Progress Notes (Signed)
RN received verbal order to discontinue the patient's PICC line.  Patient identified with name and date of birth. PICC dressing removed, site unremarkable.  PICC line removed using sterile procedure @ 1630. PICC length equal to that noted in patient's hospital chart of 44 cm. Sterile petroleum gauze + sterile 4X4 applied to PICC site, pressure applied for 10 minutes and covered with Medipore tape as a pressure dressing. Patient tolerated procedure without complaints.  Patient instructed to limit use of arm for 1 hour. Patient instructed that the pressure dressing should remain in place for 24 hours. Patient verbalized understanding of these instructions.

## 2014-09-06 NOTE — Assessment & Plan Note (Addendum)
With the difficulty he has had with IV antibiotics, I did have his PICC line removed today and will change him to doxycycline along with the Levaquin. He does have some renal insufficiency, somewhat increased from his baseline on the vancomycin and will recheck again today. I will avoid Bactrim with the renal insufficiency. I will have him then plan on taking this for 2 months and consider repeat of his MRI at that time. I will have him follow-up in one month to see how he is doing on the medication and see if his foot is healing. He is in agreement with this plan. 40 minutes spent with the patient including 20 minutes of face-to-face counseling on antibiotics, PICC removal and exam.

## 2014-09-06 NOTE — Progress Notes (Signed)
   Subjective:    Patient ID: [name], male    DOB: 07-17-1940, 74 y.o.   MRN: [id number]  HPI He comes in for follow up of osteomyelitis and septic arthritis in his foot. He has a history of diabetes with the last hemoglobin A1c in system over a year ago of 11. He also has had a chronic wound of his left foot and nonhealing ulcer and recently due to increased pain and swelling of his metatarsals, he had an MRI which is consistent with septic arthritis and osteomyelitis of the third toe. He was sent here by wound care. He has not had any debridement or aspiration. After I saw him I started him on levaquin renally dosed and IV vancomycin by home health.  Unfortuntaly he has not been able to do the home infusions well on his own and has relied on home health, who will no longer be assisting him.  He refused labs by home health today.  No new issues otherwise.    Review of Systems  Constitutional: Negative for fever, chills and fatigue.  Gastrointestinal: Negative for nausea and diarrhea.  Musculoskeletal:       Less pain and swelling of left foot  Skin: Negative for rash.  Neurological: Negative for dizziness and light-headedness.  Hematological: Negative for adenopathy.       Objective:   Physical Exam  Constitutional: He appears well-developed and well-nourished. No distress.  Eyes: No scleral icterus.  Cardiovascular: Normal rate, regular rhythm and normal heart sounds.   No murmur heard. Pulmonary/Chest: Effort normal and breath sounds normal. No respiratory distress. He has no wheezes.  Musculoskeletal:  wrapped  Skin: No rash noted.          Assessment & Plan:

## 2014-09-07 LAB — BASIC METABOLIC PANEL WITH GFR
BUN: 9 mg/dL (ref 6–23)
CO2: 28 mEq/L (ref 19–32)
Calcium: 8.6 mg/dL (ref 8.4–10.5)
Chloride: 99 mEq/L (ref 96–112)
Creat: 1.28 mg/dL (ref 0.50–1.35)
GFR, Est African American: 65 mL/min
GFR, Est Non African American: 56 mL/min — ABNORMAL LOW
Glucose, Bld: 116 mg/dL — ABNORMAL HIGH (ref 70–99)
Potassium: 3.2 mEq/L — ABNORMAL LOW (ref 3.5–5.3)
Sodium: 137 mEq/L (ref 135–145)

## 2014-09-07 LAB — C-REACTIVE PROTEIN: CRP: 15.8 mg/dL — ABNORMAL HIGH (ref <0.60)

## 2014-09-07 LAB — SEDIMENTATION RATE: Sed Rate: 76 mm/hr — ABNORMAL HIGH (ref 0–20)

## 2014-09-12 ENCOUNTER — Encounter (HOSPITAL_COMMUNITY): Payer: Self-pay | Admitting: Emergency Medicine

## 2014-09-12 ENCOUNTER — Emergency Department (INDEPENDENT_AMBULATORY_CARE_PROVIDER_SITE_OTHER)
Admission: EM | Admit: 2014-09-12 | Discharge: 2014-09-12 | Disposition: A | Discharge status: Home / Self Care | Payer: PPO | Source: Home / Self Care | Attending: Emergency Medicine | Admitting: Emergency Medicine

## 2014-09-12 DIAGNOSIS — H539 Unspecified visual disturbance: Secondary | ICD-10-CM | POA: Diagnosis not present

## 2014-09-12 NOTE — Discharge Instructions (Signed)
Please follow-up at Dr. Serita Grit office this afternoon at 1:30.

## 2014-09-12 NOTE — ED Provider Notes (Signed)
CSN: [id number]     Arrival date & time 09/12/14  1021 History   First MD Initiated Contact with Patient 09/12/14 1222     Chief Complaint  Patient presents with  . Blurred Vision   (Consider location/radiation/quality/duration/timing/severity/associated sxs/prior Treatment) HPI He is a 74 year old man here for evaluation of vision changes.  He states 2-3 days ago he noticed blurry vision in both of his eyes. He denies any eye pain or injury. His right eye is a little red. He states his blood sugars have been in the low 100s. No injury or trauma to the eyes.  Past Medical History  Diagnosis Date  . Osteomyelitis     Right great toe  . Hypertension   . Arthritis   . Peripheral vascular disease     diabetic  with osteomylitis  . High cholesterol   . Type II diabetes mellitus    Past Surgical History  Procedure Laterality Date  . Incision and drainage of wound Left 2006    "foot" (10/12/2012)  . Circumcision  2010  . Foot fracture surgery Left 1970's    "broke it playing football" (10/12/2012)  . Humerus fracture surgery w/ implant Right 1960's    "put a plate in it" (02/15/5175)  . Amputation  07/23/2011    Procedure: AMPUTATION DIGIT;  Surgeon: Newt Minion, MD;  Location: [place];  Service: Orthopedics;  Laterality: Right;  Right Great Toe Amputation  . Amputation Right 09/18/2012    Procedure: Right Foot 2nd Ray Amputation;  Surgeon: Newt Minion, MD;  Location: [place];  Service: Orthopedics;  Laterality: Right;  Right Foot 2nd Ray Amputation  . Lumbar disc surgery  2008  . Amputation Right 10/14/2012    Procedure: AMPUTATION FOOT;  Surgeon: Newt Minion, MD;  Location: [place];  Service: Orthopedics;  Laterality: Right;  Right Foot Transmetatarsal Amputation  . Back surgery      lower   Family History  Problem Relation Age of Onset  . Anesthesia problems Neg Hx   . Diabetes Mellitus II Other    History  Substance Use Topics  . Smoking status: Current Every Day Smoker -- 12  years    Types: Pipe  . Smokeless tobacco: Never Used     Comment: 10/12/2012 offered smoking cessation materials; pt declines  . Alcohol Use: No    Review of Systems As in history of present illness Allergies  Review of patient's allergies indicates no known allergies.  Home Medications   Prior to Admission medications   Medication Sig Start Date End Date Taking? Authorizing Provider  acetaminophen (TYLENOL) 500 MG tablet Take 1,000 mg by mouth every 6 (six) hours as needed for mild pain.    Historical Provider, MD  Cyanocobalamin (VITAMIN B-12 PO) Take 1 tablet by mouth daily.    Historical Provider, MD  doxycycline (VIBRA-TABS) 100 MG tablet Take 1 tablet (100 mg total) by mouth 2 (two) times daily. 09/06/14   Thayer Headings, MD  gabapentin (NEURONTIN) 100 MG capsule Take 100 mg by mouth 2 (two) times daily.     Historical Provider, MD  glipiZIDE (GLUCOTROL XL) 5 MG 24 hr tablet Take 5 mg by mouth 2 (two) times daily.     Historical Provider, MD  HYDROcodone-acetaminophen (NORCO/VICODIN) 5-325 MG per tablet Take 1 tablet by mouth every 4 (four) hours as needed for moderate pain or severe pain (pain).     Historical Provider, MD  levofloxacin (LEVAQUIN) 500 MG tablet Take 1 tablet (  500 mg total) by mouth daily. 09/06/14   Thayer Headings, MD  lisinopril (PRINIVIL,ZESTRIL) 20 MG tablet Take 20 mg by mouth every morning.     Historical Provider, MD  metFORMIN (GLUCOPHAGE) 1000 MG tablet Take 1,000 mg by mouth daily with breakfast.     Historical Provider, MD  ondansetron (ZOFRAN ODT) 4 MG disintegrating tablet Take 1 tablet (4 mg total) by mouth every 8 (eight) hours as needed for nausea or vomiting. 08/25/14   Carmin Muskrat, MD  pyridOXINE (VITAMIN B-6) 100 MG tablet Take 100 mg by mouth daily.    Historical Provider, MD   BP 154/80 mmHg  Pulse 101  Temp(Src) 98.3 F (36.8 C) (Oral)  Resp 18  SpO2 95% Physical Exam  Constitutional: He is oriented to person, place, and time. He  appears well-developed and well-nourished. No distress.  Eyes: EOM are normal. Pupils are equal, round, and reactive to light. Right eye exhibits no discharge. Left eye exhibits no discharge. Right conjunctiva is injected. Left conjunctiva is not injected.  Cardiovascular: Normal rate.   Pulmonary/Chest: Effort normal.  Neurological: He is alert and oriented to person, place, and time.    ED Course  Procedures (including critical care time) Labs Review Labs Reviewed - No data to display  Imaging Review No results found.   MDM   1. Vision changes    I spoke with Dr. Posey Pronto in ophthalmology. He will see the patient this afternoon. Patient instructed to go to Dr. Serita Grit office at 1:30 this afternoon. I provided a map to his office.    Melony Overly, MD 09/12/14 1255

## 2014-09-12 NOTE — ED Notes (Signed)
Pt states that he has been having visual changes that started this morning. States that his vision is blurry

## 2014-09-19 ENCOUNTER — Telehealth: Payer: Self-pay | Admitting: *Deleted

## 2014-09-19 ENCOUNTER — Encounter: Payer: Self-pay | Admitting: *Deleted

## 2014-09-19 NOTE — Telephone Encounter (Addendum)
Per Wound Care. pt "fired" the Center prior to coming to [facility].  Next available appt to be seen for evaluation and referral to home health is April 21 at 0900.  RN shared this information with Dr. Linus Salmons.  Dr. Linus Salmons suggested that the pt could:  1) accept this appointment at the [facility],  2) call Dr. Jess Barters office for an appointment for wound care 3) call the pt's Primary Care doctor for a referral to [facility].    Offered the pt the 3 different choices to obtain wound care.  Pt choose to call his Primary Care MD to request Home Health wound care be set up.

## 2014-09-19 NOTE — Telephone Encounter (Signed)
Referring request to Dr. Linton Ham.

## 2014-09-19 NOTE — Telephone Encounter (Signed)
The wound is followed by the wound care clinic.  He will need to go there or call them for home health needs, I do not know what his wound care needs are.

## 2014-09-19 NOTE — Telephone Encounter (Signed)
Requesting referral for Home Health RN to come to his home to provide wound care to his left foot.  States that he can't see the bottom of his foot to do the wound care.   MD please advise.

## 2014-09-20 ENCOUNTER — Telehealth: Payer: Self-pay | Admitting: *Deleted

## 2014-09-20 NOTE — Telephone Encounter (Signed)
Patient called, asking if he could have an appointment today as he feels he has "pus" coming out of his foot.  Nothing available until next week. RN spoke with Dr. Linus Salmons, he advised contacting Dr. Jess Barters office.  Spoke with triage at Dr. Jess Barters, was able to get an appointment for 12:45.  Patient notified, accepted.  Last office notes, labs, and images faxed to [phone]. Landis Gandy, RN

## 2014-09-21 ENCOUNTER — Other Ambulatory Visit (HOSPITAL_COMMUNITY): Payer: Self-pay | Admitting: Orthopedic Surgery

## 2014-09-22 MED ORDER — CEFAZOLIN SODIUM-DEXTROSE 2-3 GM-% IV SOLR
2.0000 g | INTRAVENOUS | Status: AC
  Administered 2014-09-23: 2 g via INTRAVENOUS
  Filled 2014-09-22: qty 50

## 2014-09-23 ENCOUNTER — Ambulatory Visit (HOSPITAL_COMMUNITY): Payer: PPO | Admitting: Anesthesiology

## 2014-09-23 ENCOUNTER — Observation Stay (HOSPITAL_COMMUNITY)
Admission: RE | Admit: 2014-09-23 | Discharge: 2014-09-24 | Disposition: A | Discharge status: Home / Self Care | Payer: PPO | Source: Ambulatory Visit | Attending: Orthopedic Surgery | Admitting: Orthopedic Surgery

## 2014-09-23 ENCOUNTER — Encounter (HOSPITAL_COMMUNITY): Payer: Self-pay | Admitting: *Deleted

## 2014-09-23 ENCOUNTER — Encounter (HOSPITAL_COMMUNITY)
Admission: RE | Disposition: A | Discharge status: Home / Self Care | Payer: Self-pay | Source: Ambulatory Visit | Attending: Orthopedic Surgery

## 2014-09-23 DIAGNOSIS — E780 Pure hypercholesterolemia: Secondary | ICD-10-CM | POA: Diagnosis not present

## 2014-09-23 DIAGNOSIS — L97529 Non-pressure chronic ulcer of other part of left foot with unspecified severity: Secondary | ICD-10-CM | POA: Diagnosis not present

## 2014-09-23 DIAGNOSIS — M868X7 Other osteomyelitis, ankle and foot: Secondary | ICD-10-CM | POA: Insufficient documentation

## 2014-09-23 DIAGNOSIS — M869 Osteomyelitis, unspecified: Secondary | ICD-10-CM | POA: Diagnosis present

## 2014-09-23 DIAGNOSIS — I739 Peripheral vascular disease, unspecified: Secondary | ICD-10-CM | POA: Insufficient documentation

## 2014-09-23 DIAGNOSIS — Z89431 Acquired absence of right foot: Secondary | ICD-10-CM | POA: Insufficient documentation

## 2014-09-23 DIAGNOSIS — Z89411 Acquired absence of right great toe: Secondary | ICD-10-CM | POA: Insufficient documentation

## 2014-09-23 DIAGNOSIS — E1169 Type 2 diabetes mellitus with other specified complication: Secondary | ICD-10-CM | POA: Diagnosis not present

## 2014-09-23 DIAGNOSIS — Z720 Tobacco use: Secondary | ICD-10-CM | POA: Insufficient documentation

## 2014-09-23 DIAGNOSIS — Z89439 Acquired absence of unspecified foot: Secondary | ICD-10-CM

## 2014-09-23 DIAGNOSIS — I10 Essential (primary) hypertension: Secondary | ICD-10-CM | POA: Insufficient documentation

## 2014-09-23 HISTORY — PX: AMPUTATION: SHX166

## 2014-09-23 LAB — COMPREHENSIVE METABOLIC PANEL
ALT: 13 U/L (ref 0–53)
AST: 19 U/L (ref 0–37)
Albumin: 3.4 g/dL — ABNORMAL LOW (ref 3.5–5.2)
Alkaline Phosphatase: 47 U/L (ref 39–117)
Anion gap: 10 (ref 5–15)
BUN: 7 mg/dL (ref 6–23)
CO2: 24 mmol/L (ref 19–32)
Calcium: 9 mg/dL (ref 8.4–10.5)
Chloride: 106 mmol/L (ref 96–112)
Creatinine, Ser: 1.17 mg/dL (ref 0.50–1.35)
GFR calc Af Amer: 71 mL/min — ABNORMAL LOW (ref >90)
GFR calc non Af Amer: 61 mL/min — ABNORMAL LOW (ref >90)
Glucose, Bld: 105 mg/dL — ABNORMAL HIGH (ref 70–99)
Potassium: 3.6 mmol/L (ref 3.5–5.1)
Sodium: 140 mmol/L (ref 135–145)
Total Bilirubin: 0.6 mg/dL (ref 0.3–1.2)
Total Protein: 8 g/dL (ref 6.0–8.3)

## 2014-09-23 LAB — GLUCOSE, CAPILLARY
Glucose-Capillary: 83 mg/dL (ref 70–99)
Glucose-Capillary: 90 mg/dL (ref 70–99)
Glucose-Capillary: 141 mg/dL — ABNORMAL HIGH (ref 70–99)

## 2014-09-23 LAB — APTT: aPTT: 34 seconds (ref 24–37)

## 2014-09-23 LAB — CBC
HCT: 37.1 % — ABNORMAL LOW (ref 39.0–52.0)
Hemoglobin: 12 g/dL — ABNORMAL LOW (ref 13.0–17.0)
MCH: 27.2 pg (ref 26.0–34.0)
MCHC: 32.3 g/dL (ref 30.0–36.0)
MCV: 84.1 fL (ref 78.0–100.0)
Platelets: 322 10*3/uL (ref 150–400)
RBC: 4.41 MIL/uL (ref 4.22–5.81)
RDW: 15.4 % (ref 11.5–15.5)
WBC: 6.9 10*3/uL (ref 4.0–10.5)

## 2014-09-23 LAB — PROTIME-INR
INR: 1.18 (ref 0.00–1.49)
Prothrombin Time: 15.2 seconds (ref 11.6–15.2)

## 2014-09-23 SURGERY — AMPUTATION, FOOT, RAY
Anesthesia: General | Site: Foot | Laterality: Left

## 2014-09-23 MED ORDER — ARTIFICIAL TEARS OP OINT: TOPICAL_OINTMENT | OPHTHALMIC | Status: DC | PRN

## 2014-09-23 MED ORDER — PROPOFOL 10 MG/ML IV BOLUS
INTRAVENOUS | Status: DC | PRN
  Administered 2014-09-23: 160 mg via INTRAVENOUS

## 2014-09-23 MED ORDER — METOCLOPRAMIDE HCL 5 MG/ML IJ SOLN: 5.0000 mg | Freq: Three times a day (TID) | INTRAMUSCULAR | Status: DC | PRN

## 2014-09-23 MED ORDER — DORZOLAMIDE HCL-TIMOLOL MAL 2-0.5 % OP SOLN
1.0000 [drp] | Freq: Every day | OPHTHALMIC | Status: DC
  Administered 2014-09-23: 1 [drp] via OPHTHALMIC
  Filled 2014-09-23: qty 10

## 2014-09-23 MED ORDER — OXYCODONE HCL 5 MG PO TABS
5.0000 mg | ORAL_TABLET | ORAL | Status: DC | PRN
  Administered 2014-09-23: 10 mg via ORAL
  Filled 2014-09-23: qty 2

## 2014-09-23 MED ORDER — FENTANYL CITRATE 0.05 MG/ML IJ SOLN
INTRAMUSCULAR | Status: AC
  Filled 2014-09-23: qty 5

## 2014-09-23 MED ORDER — LISINOPRIL 20 MG PO TABS: 20.0000 mg | ORAL_TABLET | Freq: Every morning | ORAL | Status: DC

## 2014-09-23 MED ORDER — SODIUM CHLORIDE 0.9 % IV SOLN
INTRAVENOUS | Status: DC
  Administered 2014-09-23: 17:00:00 via INTRAVENOUS

## 2014-09-23 MED ORDER — ONDANSETRON HCL 4 MG PO TABS: 4.0000 mg | ORAL_TABLET | Freq: Four times a day (QID) | ORAL | Status: DC | PRN

## 2014-09-23 MED ORDER — ONDANSETRON HCL 4 MG/2ML IJ SOLN
INTRAMUSCULAR | Status: DC | PRN
  Administered 2014-09-23: 4 mg via INTRAVENOUS

## 2014-09-23 MED ORDER — PHENYLEPHRINE HCL 10 MG/ML IJ SOLN
INTRAMUSCULAR | Status: DC | PRN
  Administered 2014-09-23 (×2): 40 ug via INTRAVENOUS

## 2014-09-23 MED ORDER — MIDAZOLAM HCL 2 MG/2ML IJ SOLN
INTRAMUSCULAR | Status: AC
  Filled 2014-09-23: qty 2

## 2014-09-23 MED ORDER — METFORMIN HCL 500 MG PO TABS
1000.0000 mg | ORAL_TABLET | Freq: Every day | ORAL | Status: DC
  Filled 2014-09-23: qty 2

## 2014-09-23 MED ORDER — METHOCARBAMOL 500 MG PO TABS: 500.0000 mg | ORAL_TABLET | Freq: Four times a day (QID) | ORAL | Status: DC | PRN

## 2014-09-23 MED ORDER — ACETAMINOPHEN 325 MG PO TABS: 650.0000 mg | ORAL_TABLET | Freq: Four times a day (QID) | ORAL | Status: DC | PRN

## 2014-09-23 MED ORDER — METHOCARBAMOL 1000 MG/10ML IJ SOLN
500.0000 mg | Freq: Four times a day (QID) | INTRAMUSCULAR | Status: DC | PRN
  Filled 2014-09-23: qty 5

## 2014-09-23 MED ORDER — 0.9 % SODIUM CHLORIDE (POUR BTL) OPTIME
TOPICAL | Status: DC | PRN
  Administered 2014-09-23: 1000 mL

## 2014-09-23 MED ORDER — LIDOCAINE HCL (CARDIAC) 20 MG/ML IV SOLN
INTRAVENOUS | Status: DC | PRN
  Administered 2014-09-23: 50 mg via INTRAVENOUS

## 2014-09-23 MED ORDER — KETOROLAC TROMETHAMINE 0.4 % OP SOLN: 1.0000 [drp] | Freq: Four times a day (QID) | OPHTHALMIC | Status: DC

## 2014-09-23 MED ORDER — LACTATED RINGERS IV SOLN
INTRAVENOUS | Status: DC
  Administered 2014-09-23: 10:00:00 via INTRAVENOUS

## 2014-09-23 MED ORDER — KETOROLAC TROMETHAMINE 0.4 % OP SOLN
1.0000 [drp] | Freq: Four times a day (QID) | OPHTHALMIC | Status: DC
  Filled 2014-09-23: qty 5

## 2014-09-23 MED ORDER — CEFAZOLIN SODIUM 1-5 GM-% IV SOLN
1.0000 g | Freq: Four times a day (QID) | INTRAVENOUS | Status: AC
  Administered 2014-09-23 – 2014-09-24 (×3): 1 g via INTRAVENOUS
  Filled 2014-09-23 (×3): qty 50

## 2014-09-23 MED ORDER — HYDROMORPHONE HCL 1 MG/ML IJ SOLN: 1.0000 mg | INTRAMUSCULAR | Status: DC | PRN

## 2014-09-23 MED ORDER — DIFLUPREDNATE 0.05 % OP EMUL: 1.0000 [drp] | Freq: Two times a day (BID) | OPHTHALMIC | Status: DC

## 2014-09-23 MED ORDER — MIDAZOLAM HCL 5 MG/5ML IJ SOLN
INTRAMUSCULAR | Status: DC | PRN
  Administered 2014-09-23 (×2): 1 mg via INTRAVENOUS

## 2014-09-23 MED ORDER — FENTANYL CITRATE 0.05 MG/ML IJ SOLN
INTRAMUSCULAR | Status: DC | PRN
  Administered 2014-09-23 (×2): 50 ug via INTRAVENOUS

## 2014-09-23 MED ORDER — GLIPIZIDE 5 MG PO TABS
5.0000 mg | ORAL_TABLET | Freq: Two times a day (BID) | ORAL | Status: DC
  Filled 2014-09-23 (×2): qty 1

## 2014-09-23 MED ORDER — ACETAMINOPHEN 650 MG RE SUPP: 650.0000 mg | Freq: Four times a day (QID) | RECTAL | Status: DC | PRN

## 2014-09-23 MED ORDER — ASPIRIN EC 325 MG PO TBEC
325.0000 mg | DELAYED_RELEASE_TABLET | Freq: Every day | ORAL | Status: DC
  Administered 2014-09-23: 325 mg via ORAL
  Filled 2014-09-23: qty 1

## 2014-09-23 MED ORDER — INSULIN ASPART 100 UNIT/ML ~~LOC~~ SOLN: 0.0000 [IU] | Freq: Three times a day (TID) | SUBCUTANEOUS | Status: DC

## 2014-09-23 MED ORDER — METOCLOPRAMIDE HCL 5 MG PO TABS: 5.0000 mg | ORAL_TABLET | Freq: Three times a day (TID) | ORAL | Status: DC | PRN

## 2014-09-23 MED ORDER — ONDANSETRON HCL 4 MG/2ML IJ SOLN: 4.0000 mg | Freq: Four times a day (QID) | INTRAMUSCULAR | Status: DC | PRN

## 2014-09-23 MED ORDER — GABAPENTIN 100 MG PO CAPS
100.0000 mg | ORAL_CAPSULE | Freq: Two times a day (BID) | ORAL | Status: DC
  Administered 2014-09-23: 100 mg via ORAL
  Filled 2014-09-23: qty 1

## 2014-09-23 MED ORDER — KETOROLAC TROMETHAMINE 0.5 % OP SOLN
1.0000 [drp] | Freq: Four times a day (QID) | OPHTHALMIC | Status: DC
  Administered 2014-09-23 (×2): 1 [drp] via OPHTHALMIC
  Filled 2014-09-23: qty 3

## 2014-09-23 MED ORDER — LATANOPROST 0.005 % OP SOLN
1.0000 [drp] | Freq: Every day | OPHTHALMIC | Status: DC
  Administered 2014-09-23: 1 [drp] via OPHTHALMIC
  Filled 2014-09-23: qty 2.5

## 2014-09-23 SURGICAL SUPPLY — 31 items
BLADE SAW SGTL MED 73X18.5 STR (BLADE) IMPLANT
BNDG COHESIVE 4X5 TAN STRL (GAUZE/BANDAGES/DRESSINGS) ×2 IMPLANT
BNDG GAUZE ELAST 4 BULKY (GAUZE/BANDAGES/DRESSINGS) ×2 IMPLANT
COVER SURGICAL LIGHT HANDLE (MISCELLANEOUS) ×2 IMPLANT
DRAPE U-SHAPE 47X51 STRL (DRAPES) ×4 IMPLANT
DRSG ADAPTIC 3X8 NADH LF (GAUZE/BANDAGES/DRESSINGS) ×2 IMPLANT
DRSG PAD ABDOMINAL 8X10 ST (GAUZE/BANDAGES/DRESSINGS) ×2 IMPLANT
DURAPREP 26ML APPLICATOR (WOUND CARE) ×2 IMPLANT
ELECT REM PT RETURN 9FT ADLT (ELECTROSURGICAL) ×2
ELECTRODE REM PT RTRN 9FT ADLT (ELECTROSURGICAL) ×1 IMPLANT
GAUZE SPONGE 4X4 12PLY STRL (GAUZE/BANDAGES/DRESSINGS) ×2 IMPLANT
GLOVE BIOGEL PI IND STRL 9 (GLOVE) ×1 IMPLANT
GLOVE BIOGEL PI INDICATOR 9 (GLOVE) ×1
GLOVE SURG ORTHO 9.0 STRL STRW (GLOVE) ×2 IMPLANT
GOWN STRL REUS W/ TWL LRG LVL3 (GOWN DISPOSABLE) ×1 IMPLANT
GOWN STRL REUS W/ TWL XL LVL3 (GOWN DISPOSABLE) ×2 IMPLANT
GOWN STRL REUS W/TWL LRG LVL3 (GOWN DISPOSABLE) ×4
GOWN STRL REUS W/TWL XL LVL3 (GOWN DISPOSABLE) ×4
KIT BASIN OR (CUSTOM PROCEDURE TRAY) ×2 IMPLANT
KIT ROOM TURNOVER OR (KITS) ×2 IMPLANT
NS IRRIG 1000ML POUR BTL (IV SOLUTION) ×2 IMPLANT
PACK ORTHO EXTREMITY (CUSTOM PROCEDURE TRAY) ×2 IMPLANT
PAD ARMBOARD 7.5X6 YLW CONV (MISCELLANEOUS) ×4 IMPLANT
SPONGE GAUZE 4X4 12PLY STER LF (GAUZE/BANDAGES/DRESSINGS) ×2 IMPLANT
SPONGE LAP 18X18 X RAY DECT (DISPOSABLE) ×2 IMPLANT
STOCKINETTE IMPERVIOUS LG (DRAPES) IMPLANT
SUT ETHILON 2 0 PSLX (SUTURE) ×4 IMPLANT
TOWEL OR 17X24 6PK STRL BLUE (TOWEL DISPOSABLE) ×2 IMPLANT
TOWEL OR 17X26 10 PK STRL BLUE (TOWEL DISPOSABLE) ×2 IMPLANT
UNDERPAD 30X30 INCONTINENT (UNDERPADS AND DIAPERS) ×2 IMPLANT
WATER STERILE IRR 1000ML POUR (IV SOLUTION) ×2 IMPLANT

## 2014-09-23 NOTE — Anesthesia Preprocedure Evaluation (Signed)
Anesthesia Evaluation  Patient identified by MRN, date of birth, ID band Patient awake    Reviewed: Allergy & Precautions, NPO status , Patient's Chart, lab work & pertinent test results  Airway Mallampati: II  TM Distance: >3 FB Neck ROM: Full    Dental no notable dental hx.    Pulmonary neg pulmonary ROS, Current Smoker,  breath sounds clear to auscultation  Pulmonary exam normal       Cardiovascular hypertension, Pt. on medications + Peripheral Vascular Disease Rhythm:Regular Rate:Normal     Neuro/Psych negative neurological ROS  negative psych ROS   GI/Hepatic negative GI ROS, Neg liver ROS,   Endo/Other  diabetes  Renal/GU negative Renal ROS  negative genitourinary   Musculoskeletal negative musculoskeletal ROS (+)   Abdominal   Peds negative pediatric ROS (+)  Hematology negative hematology ROS (+)   Anesthesia Other Findings   Reproductive/Obstetrics negative OB ROS                             Anesthesia Physical Anesthesia Plan  ASA: III  Anesthesia Plan: General   Post-op Pain Management:    Induction: Intravenous  Airway Management Planned: LMA  Additional Equipment:   Intra-op Plan:   Post-operative Plan: Extubation in OR  Informed Consent: I have reviewed the patients History and Physical, chart, labs and discussed the procedure including the risks, benefits and alternatives for the proposed anesthesia with the patient or authorized representative who has indicated his/her understanding and acceptance.   Dental advisory given  Plan Discussed with: CRNA and Surgeon  Anesthesia Plan Comments:         Anesthesia Quick Evaluation

## 2014-09-23 NOTE — H&P (Signed)
[name] is an 74 y.o. male.   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 Osteomyelitis     Right great toe  . Hypertension   . Arthritis   . Peripheral vascular disease     diabetic  with osteomylitis  . High cholesterol   . Type II diabetes mellitus     Past Surgical History  Procedure Laterality Date  . Incision and drainage of wound Left 2006    "foot" (10/12/2012)  . Circumcision  2010  . Foot fracture surgery Left 1970's    "broke it playing football" (10/12/2012)  . Humerus fracture surgery w/ implant Right 1960's    "put a plate in it" (3/83/3383)  . Amputation  07/23/2011    Procedure: AMPUTATION DIGIT;  Surgeon: Newt Minion, MD;  Location: [facility];  Service: Orthopedics;  Laterality: Right;  Right Great Toe Amputation  . Amputation Right 09/18/2012    Procedure: Right Foot 2nd Ray Amputation;  Surgeon: Newt Minion, MD;  Location: [facility];  Service: Orthopedics;  Laterality: Right;  Right Foot 2nd Ray Amputation  . Lumbar disc surgery  2008  . Amputation Right 10/14/2012    Procedure: AMPUTATION FOOT;  Surgeon: Newt Minion, MD;  Location: [facility];  Service: Orthopedics;  Laterality: Right;  Right Foot Transmetatarsal Amputation  . Back surgery      lower    Family History  Problem Relation Age of Onset  . Anesthesia problems Neg Hx   . Diabetes Mellitus II Other    Social History:  reports that he has been smoking Pipe.  He has never used smokeless tobacco. He reports that he does not drink alcohol or use illicit drugs.  Allergies: No Known Allergies  No prescriptions prior to admission    No results found for this or any previous visit (from the past 48 hour(s)). No results found.  Review of Systems  All other  systems reviewed and are negative.   There were no vitals taken for this visit. Physical Exam  On examination patient has ulceration osteomyelitis left forefoot. Assessment/Plan Assessment: Osteomyelitis ulceration left forefoot.  Plan: We'll plan for left transmetatarsal amputation. Risks and benefits were discussed including risk of the wound not healing need for higher level amputation. Patient states he understands and wishes to proceed at this time.  [name] 09/23/2014, 5:56 AM

## 2014-09-23 NOTE — Op Note (Signed)
09/23/2014  12:20 PM  PATIENT:  [name]    PRE-OPERATIVE DIAGNOSIS:  Left Foot Osteomyelitis   POST-OPERATIVE DIAGNOSIS:  Same  PROCEDURE:  Left Foot Transmetatarsal Amputation  SURGEON:  Newt Minion, MD  PHYSICIAN ASSISTANT:None ANESTHESIA:   General  PREOPERATIVE INDICATIONS:  [name] is a  74 y.o. male with a diagnosis of Left Foot Osteomyelitis  who failed conservative measures and elected for surgical management.    The risks benefits and alternatives were discussed with the patient preoperatively including but not limited to the risks of infection, bleeding, nerve injury, cardiopulmonary complications, the need for revision surgery, among others, and the patient was willing to proceed.  OPERATIVE IMPLANTS: None  OPERATIVE FINDINGS: Calcified vessels  OPERATIVE PROCEDURE: Patient was brought to the operating room and underwent a general anesthetic. After adequate levels of anesthesia were obtained patient's left lower extremity was prepped using DuraPrep draped into a sterile field. A timeout was called. A fishmouth incision was made through the midfoot. This was carried down and oscillating saw was used to perform a transmetatarsal amputation. Hemostasis was obtained. The wound was irrigated with normal saline. Sterile compressive dressing was applied. Patient was extubated taken to the PACU in stable condition.

## 2014-09-23 NOTE — Anesthesia Postprocedure Evaluation (Signed)
  Anesthesia Post-op Note  Patient: [name]  Procedure(s) Performed: Procedure(s): Left Foot Transmetatarsal Amputation (Left)  Patient Location: PACU  Anesthesia Type:General  Level of Consciousness: awake and alert   Airway and Oxygen Therapy: Patient Spontanous Breathing  Post-op Pain: mild  Post-op Assessment: Post-op Vital signs reviewed  Post-op Vital Signs: Reviewed  Last Vitals:  Filed Vitals:   09/23/14 1457  BP: 151/74  Pulse: 56  Temp: 36.5 C  Resp: 16    Complications: No apparent anesthesia complications

## 2014-09-23 NOTE — Anesthesia Procedure Notes (Signed)
Procedure Name: LMA Insertion Date/Time: 09/23/2014 12:04 PM Performed by: Suzy Bouchard Pre-anesthesia Checklist: Emergency Drugs available, Suction available, Patient being monitored, Patient identified and Timeout performed Patient Re-evaluated:Patient Re-evaluated prior to inductionOxygen Delivery Method: Circle system utilized Preoxygenation: Pre-oxygenation with 100% oxygen Intubation Type: IV induction Ventilation: Mask ventilation without difficulty LMA: LMA inserted LMA Size: 4.0 Number of attempts: 1 Placement Confirmation: ETT inserted through vocal cords under direct vision and breath sounds checked- equal and bilateral Tube secured with: Tape

## 2014-09-23 NOTE — Transfer of Care (Signed)
Immediate Anesthesia Transfer of Care Note  Patient: [name]  Procedure(s) Performed: Procedure(s): Left Foot Transmetatarsal Amputation (Left)  Patient Location: PACU  Anesthesia Type:General  Level of Consciousness: sedated  Airway & Oxygen Therapy: Patient Spontanous Breathing and Patient connected to nasal cannula oxygen  Post-op Assessment: Report given to RN and Post -op Vital signs reviewed and stable  Post vital signs: Reviewed and stable  Last Vitals:  Filed Vitals:   09/23/14 0905  BP: 138/83  Pulse: 77  Temp: 36.6 C  Resp: 18    Complications: No apparent anesthesia complications

## 2014-09-23 NOTE — Progress Notes (Signed)
Orthopedic Tech Progress Note Patient Details:  [name] 06/01/1941 [id number]  Ortho Devices Type of Ortho Device: Postop shoe/boot Ortho Device/Splint Location: LLE Ortho Device/Splint Interventions: Ordered, Application   Braulio Bosch 09/23/2014, 3:54 PM

## 2014-09-24 DIAGNOSIS — E1169 Type 2 diabetes mellitus with other specified complication: Secondary | ICD-10-CM | POA: Diagnosis not present

## 2014-09-24 LAB — GLUCOSE, CAPILLARY
Glucose-Capillary: 102 mg/dL — ABNORMAL HIGH (ref 70–99)
Glucose-Capillary: 104 mg/dL — ABNORMAL HIGH (ref 70–99)

## 2014-09-24 NOTE — Discharge Summary (Signed)
  Discharge to home in stable condition final diagnosis osteomyelitis abscess forefoot. Surgical intervention left transmetatarsal amputation. Dressing clean and dry. Minimize weightbearing left lower extremity. Patient has prescriptions for pain medicine at home. Follow-up in the office in 2 weeks. Hospital course essentially unremarkable.

## 2014-09-24 NOTE — Evaluation (Signed)
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 PMH: DM, OA, PVD  Clinical Impression  Patient evaluated by Physical Therapy with no further acute PT needs identified. All education has been completed and the patient has no further questions. Pt safe with ambulation with RW, will need this equipment for d/c home today. See below for any follow-up Physial Therapy or equipment needs. PT is signing off. Thank you for this referral.     Follow Up Recommendations No PT follow up    Equipment Recommendations  Rolling walker with 5" wheels    Recommendations for Other Services       Precautions / Restrictions Precautions Precautions: None Required Braces or Orthoses: Other Brace/Splint Other Brace/Splint: post-op shoe Restrictions Weight Bearing Restrictions: Yes LLE Weight Bearing: Touchdown weight bearing      Mobility  Bed Mobility Overal bed mobility: Independent                Transfers Overall transfer level: Independent Equipment used: Rolling walker (2 wheeled)                Ambulation/Gait Ambulation/Gait assistance: Modified independent (Device/Increase time) Ambulation Distance (Feet): 30 Feet Assistive device: Rolling walker (2 wheeled) Gait Pattern/deviations: Step-to pattern Gait velocity: decreased   General Gait Details: vc's for sequencing for pain control and TDWB status. Educated pt on consistent use of RW to maintain WB status  Financial trader Rankin (Stroke Patients Only)       Balance Overall balance assessment: No apparent balance deficits (not formally assessed)                                            Pertinent Vitals/Pain Pain Assessment: 0-10 Pain Score: 2  Pain Location: left foot Pain Intervention(s): Repositioned;Limited activity within patient's tolerance  VSS    Home Living Family/patient expects to be discharged to:: Private residence Living Arrangements: Alone Available Help at Discharge: Friend(s) Type of Home: Apartment Home Access: Level entry     Home Layout: One level Home Equipment: Cane - single point      Prior Function Level of Independence: Independent         Comments: has family support     Hand Dominance        Extremity/Trunk Assessment   Upper Extremity Assessment: Overall WFL for tasks assessed           Lower Extremity Assessment: Overall WFL for tasks assessed      Cervical / Trunk Assessment: Normal  Communication   Communication: No difficulties  Cognition Arousal/Alertness: Awake/alert Behavior During Therapy: WFL for tasks assessed/performed Overall Cognitive Status: Within Functional Limits for tasks assessed                      General Comments General comments (skin integrity, edema, etc.): educated pt on appropriate activity level upon return home    Exercises General Exercises - Lower Extremity Ankle Circles/Pumps: AROM;Both;Seated;20 reps      Assessment/Plan  PT Assessment Patent does not need any further PT services  PT Diagnosis Difficulty walking;Abnormality of gait;Acute pain   PT Problem List    PT Treatment Interventions     PT Goals (Current goals can be found in the Care Plan section) Acute Rehab PT Goals Patient Stated Goal: return home ASAP PT Goal Formulation: All assessment and education complete, DC therapy    Frequency     Barriers to discharge        Co-evaluation               End of Session   Activity Tolerance: Patient tolerated treatment well Patient left: in chair;with call bell/phone within  reach Nurse Communication: Mobility status    Functional Assessment Tool Used: clinical judgement Functional Limitation: Mobility: Walking and moving around Mobility: Walking and Moving Around Current Status [phone]): 0 percent impaired, limited or restricted Mobility: Walking and Moving Around Goal Status [phone]): 0 percent impaired, limited or restricted Mobility: Walking and Moving Around Discharge Status [phone]): 0 percent impaired, limited or restricted    Time: 0912-0927 PT Time Calculation (min) (ACUTE ONLY): 15 min   Charges:   PT Evaluation $Initial PT Evaluation Tier I: 1 Procedure     PT G Codes:   PT G-Codes **NOT FOR INPATIENT CLASS** Functional Assessment Tool Used: clinical judgement Functional Limitation: Mobility: Walking and moving around Mobility: Walking and Moving Around Current Status (O6712): 0 percent impaired, limited or restricted Mobility: Walking and Moving Around Goal Status (W5809): 0 percent impaired, limited or restricted Mobility: Walking and Moving Around Discharge Status [phone]): 0 percent impaired, limited or restricted  Leighton Roach, PT  Acute Rehab Services  [place], [place] 09/24/2014, 10:44 AM

## 2014-09-24 NOTE — Progress Notes (Signed)
UR completed 

## 2014-09-25 NOTE — Care Management Note (Signed)
    Page 1 of 1   09/25/2014     6:01:59 PM CARE MANAGEMENT NOTE 09/25/2014  Patient:  [name], [name]   Account Number:  [id number]  Date Initiated:  09/24/2014  Documentation initiated by:  [facility]  Subjective/Objective Assessment:   adm: right transmetatarsal amputation     Action/Plan:   discharge planning   Anticipated DC Date:  09/24/2014   Anticipated DC Plan:  [place]  CM consult      Choice offered to / List presented to:             Status of service:  Completed, signed off Medicare Important Message given?   (If response is "NO", the following Medicare IM given date fields will be blank) Date Medicare IM given:   Medicare IM given by:   Date Additional Medicare IM given:   Additional Medicare IM given by:    Discharge Disposition:  HOME/SELF CARE  Per UR Regulation:    If discussed at Long Length of Stay Meetings, dates discussed:    Comments:  09/24/14 10:15 Cm received call from RN requesting DMe.  CM called [facility] DME rep, Jeneen Rinks to please deliver the rolling walker to room prior to discharge.  No other CM needs were communicated.  Mariane Masters, BSn, CM [phone].

## 2014-09-26 NOTE — Progress Notes (Signed)
Patient has contacted Short stay surgical and [facility] regarding missing bag with insurance information that was placed in bag in short stay surgical with clothing. Bag was taken to PACU. Patient reports that he got clothing back but did not get bag with insurance information back. Patient provided contact information to insurance company from card that is scanned in and member ID. Patient also advised he could come to medical records and obtain a copy if needed until he could get a replacement. Patient verbalized understanding.

## 2014-09-27 ENCOUNTER — Encounter (HOSPITAL_COMMUNITY): Payer: Self-pay | Admitting: Orthopedic Surgery

## 2014-09-30 ENCOUNTER — Encounter: Payer: Self-pay | Admitting: Internal Medicine

## 2014-10-03 ENCOUNTER — Telehealth: Payer: Self-pay | Admitting: *Deleted

## 2014-10-03 NOTE — Telephone Encounter (Signed)
Hospitalized for L Transmetatarsal amputation 09/23/14.  Pt remains on oral Doxycycline and Levaquin.  He has an appointment preciously scheduled for 10/25/14 @ 10:30 w/ Dr. Linus Salmons.  MD please advise about any changes for this pt.

## 2014-10-03 NOTE — Telephone Encounter (Signed)
He can now stop the antibiotics and cancel follow up with me.  The infection has been cured with amputation and to keep follow up with Dr. Sharol Given.  thanks

## 2014-10-04 NOTE — Telephone Encounter (Addendum)
Shared Dr. Henreitta Leber note with the pt.  Pt verbalized understanding.  Discontinued oral antibiotics from pt's medication profile.

## 2014-10-04 NOTE — Addendum Note (Signed)
Addended by: Lorne Skeens D on: 10/04/2014 02:29 PM   Modules accepted: Orders, Medications

## 2014-10-25 ENCOUNTER — Ambulatory Visit: Payer: PPO | Admitting: Internal Medicine

## 2014-11-04 ENCOUNTER — Encounter: Payer: Self-pay | Admitting: Internal Medicine

## 2015-05-11 IMAGING — XA IR US GUIDE VASC ACCESS RIGHT
1 series · 2 of 2 positions shown · non-contrast
Comparison: none

CLINICAL DATA: Foot infection, needs venous access for antibiotics

EXAM:
PICC PLACEMENT WITH ULTRASOUND AND FLUOROSCOPY
FLUOROSCOPY TIME:  18 seconds
TECHNIQUE: After written informed consent was obtained, patient was placed in
the supine position on angiographic table. Patency of the right
basilic vein was confirmed with ultrasound with image documentation.
An appropriate skin site was determined. Skin site was marked.
Region was prepped using maximum barrier technique including cap and
mask, sterile gown, sterile gloves, large sterile sheet, and
Chlorhexidine as cutaneous antisepsis. The region was infiltrated
locally with 1% lidocaine. Under real-time ultrasound guidance, the
right basilic vein was accessed with a 21 gauge micropuncture
needle; the needle tip within the vein was confirmed with ultrasound
image documentation. Needle exchanged over a 018 guidewire for a
peel-away sheath, through which a 5-French Single-lumen power
injectable PICC trimmed to 44 cm was advanced, positioned with its
tip near the cavoatrial junction. Spot chest radiograph confirms
appropriate catheter position. Catheter was flushed per protocol and
secured externally. The patient tolerated procedure well.
COMPLICATIONS:
COMPLICATIONS
none

[Series 1: run · 2 of 2 slices shown]
[im 1/2]
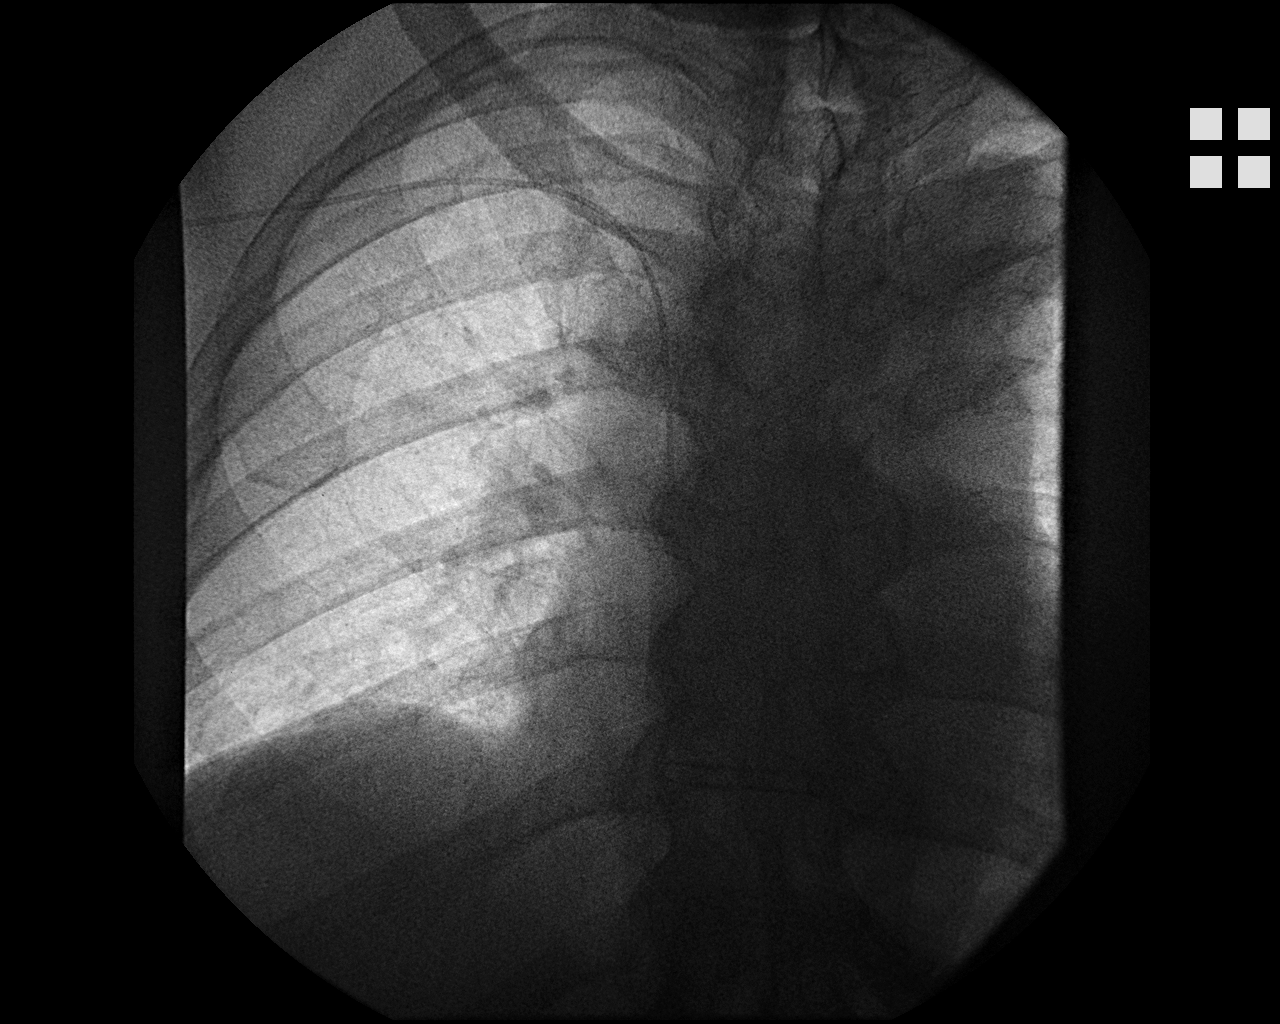
[im 2/2]
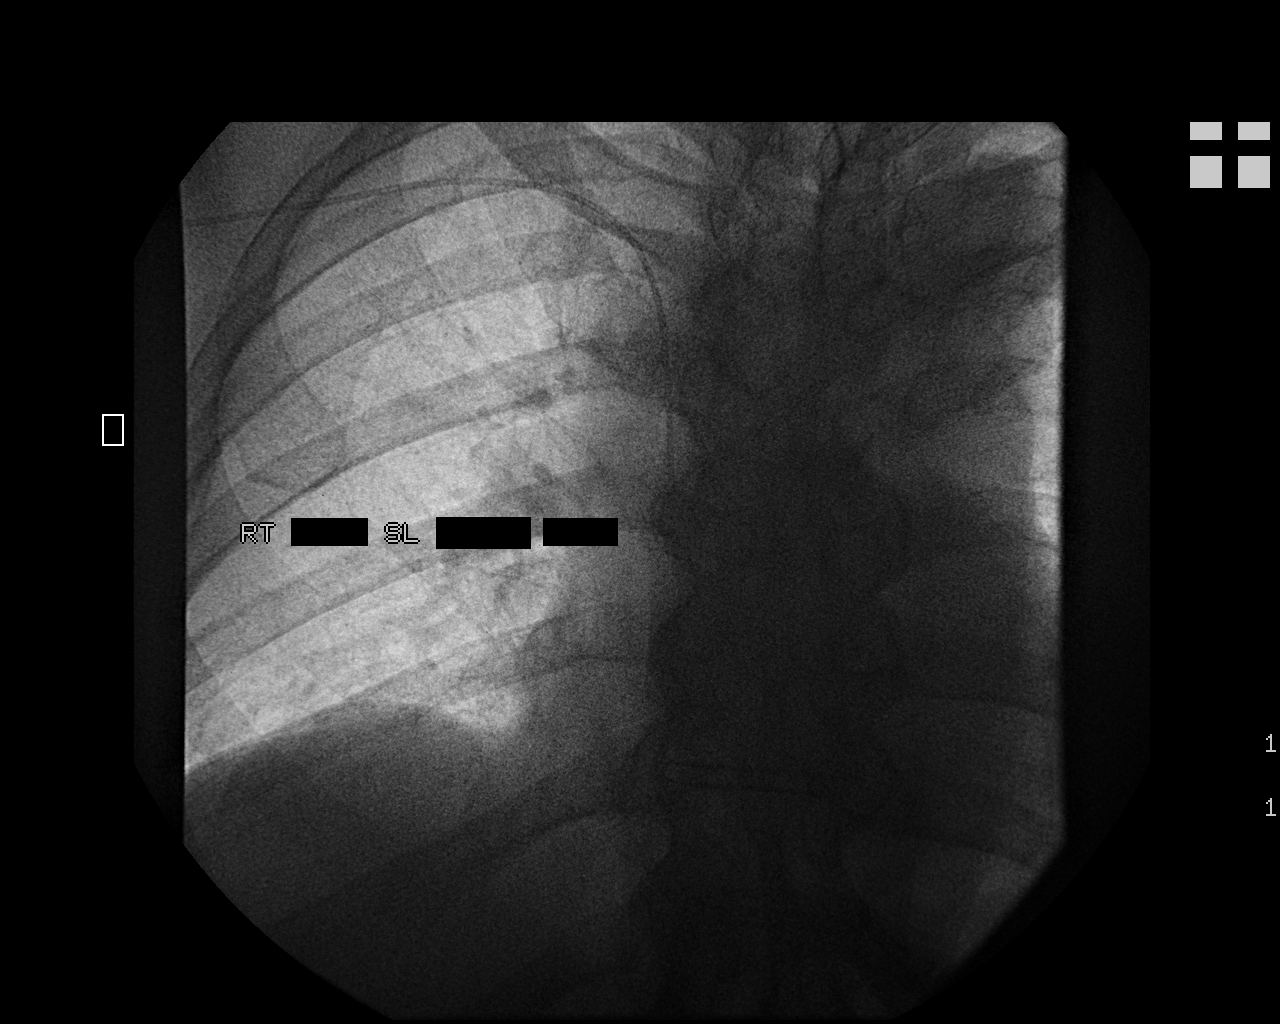

[2 of 2 positions shown; findings below may reference images not displayed]

IMPRESSION: 1. Technically successful five French Single lumen power injectable
PICC placement

## 2015-06-20 ENCOUNTER — Telehealth: Payer: Self-pay | Admitting: *Deleted

## 2015-06-20 NOTE — Telephone Encounter (Signed)
Feels he is starting a new infection, would like to know if he can make an appointment.  RN advised patient to start with his primary care doctor, as we had treated him for osteomyelitis in the past that was cured via amputation.  Patietn states he is now having "spots on his skin, on his chest."  RN advised patient that he definitely needs to see his primary doctor first. Lavone Neri, Lanice Schwab, RN

## 2015-07-05 NOTE — Telephone Encounter (Signed)
Patient called and advised he needs to see Dr Linus Salmons as he feels he is having new spots on his leg and that is the way his last infections started. Asked the patient if he has seen his PCP for the new infection. He advised no he wants to see Dr Linus Salmons. Advised him I can make him an appt for first available which is 08/17/15 but he will need to see his PCP in the mean time. He was very angry advised doctors are out to get money and he will call us back because he does not want to wait that long to see the doctor. Advised he can certainly do that but the appts are going to keep getting further out and he needs to make a decision asap.

## 2016-07-22 ENCOUNTER — Ambulatory Visit (INDEPENDENT_AMBULATORY_CARE_PROVIDER_SITE_OTHER): Payer: PPO | Admitting: Orthopedic Surgery

## 2016-07-22 ENCOUNTER — Encounter (INDEPENDENT_AMBULATORY_CARE_PROVIDER_SITE_OTHER): Payer: Self-pay | Admitting: Orthopedic Surgery

## 2016-07-22 ENCOUNTER — Ambulatory Visit (INDEPENDENT_AMBULATORY_CARE_PROVIDER_SITE_OTHER): Payer: Self-pay | Admitting: Orthopedic Surgery

## 2016-07-22 VITALS — Ht 73.0 in | Wt 212.0 lb

## 2016-07-22 DIAGNOSIS — L97421 Non-pressure chronic ulcer of left heel and midfoot limited to breakdown of skin: Secondary | ICD-10-CM

## 2016-07-22 DIAGNOSIS — L97411 Non-pressure chronic ulcer of right heel and midfoot limited to breakdown of skin: Secondary | ICD-10-CM | POA: Insufficient documentation

## 2016-07-22 DIAGNOSIS — Z89432 Acquired absence of left foot: Secondary | ICD-10-CM

## 2016-07-22 NOTE — Progress Notes (Signed)
Office Visit Note   Patient: [name]           Date of Birth: 10-17-1940           MRN: [id number] Visit Date: 07/22/2016              Requested by: Lottie Dawson, MD [address]  [place], [place] [address] PCP: Ileana Roup, MD  Chief Complaint  Patient presents with  . Left Foot - Wound Check    Left foot transmetatarsal callus    HPI: Patient presents today for left midfoot amputation plantar callus. There is skin breakdown with bloody drainage under distal callus. There is another callus at arch. He is wearing compression stocking. Maxcine Ham, RT    Assessment & Plan: Visit Diagnoses:  1. Status post transmetatarsal amputation of left foot ([place])   2. Midfoot ulcer, left, limited to breakdown of skin ([place])     Plan: Ulcer debridement skin and soft tissue 2 without complications. Patient will follow up in 2 months for repeat evaluation of the 2 Wagner grade 1 ulcers plantar aspect the left foot. He does have new shoes and orthotics and he will start wearing these.  Follow-Up Instructions: Return in about 2 months (around 09/19/2016).   Ortho Exam On examination patient is alert oriented no adenopathy well-dressed normal affect normal respiratory effort he does have an antalgic gait uses a cane. Examination he does not have protective sensation he has a faintly palpable pulse he has to [employer] grade 1 ulcers in the plantar aspect of the left foot. After informed consent a 10 blade knife was used to debride the skin and soft tissue back to healthy viable granulation tissue there is no abscess no cellulitis no drainage no signs of infection. The most distal ulcer is 15 mm in diameter 1 mm deep the more proximal heel ulcer is 3 cm in diameter and 3 mm deep.  Imaging: No results found.  Orders:  No orders of the defined types were placed in this encounter.  No orders of the defined types were placed in this encounter.     Procedures: No procedures performed  Clinical Data: No additional findings.  Subjective: Review of Systems  Objective: Vital Signs: Ht 6\' 1"  (1.854 m)   Wt 212 lb (96.2 kg)   BMI 27.97 kg/m   Specialty Comments:  No specialty comments available.  PMFS History: Patient Active Problem List   Diagnosis Date Noted  . Midfoot ulcer, left, limited to breakdown of skin ([place]) 07/22/2016  . 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[place]) 09/23/2014  . Osteomyelitis of ankle or foot, left, acute ([place]) 08/23/2014  . Cellulitis 10/12/2012  . Chest pain 10/12/2012  . Tobacco abuse 10/12/2012  . ABSCESS, AXILLA, LEFT 02/23/2008  . 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 CHEST WALL PAIN, ACUTE 12/03/2007  . CANDIDIASIS, GLANS PENIS 09/16/2007  . HEADACHE 08/04/2007  . 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 DIABETES MELLITUS, TYPE II 03/05/2007  . Other and unspecified hyperlipidemia 03/05/2007  . GOUT 03/05/2007  . ERECTILE DYSFUNCTION 03/05/2007  . EXTERNAL HEMORRHOIDS 03/05/2007  . VENTRAL HERNIA 03/05/2007  . FATTY LIVER DISEASE 03/05/2007  . OSTEOARTHRITIS 03/05/2007  . HEMORRHOIDS, INTERNAL 03/17/2005  . COLONIC POLYPS, HX OF 03/17/2005   Past Medical History:  Diagnosis Date  . Arthritis   . High cholesterol   . Hypertension   . Osteomyelitis (HCC)    Right great toe  . Peripheral vascular disease ([place])  diabetic  with osteomylitis  . Type II diabetes mellitus (HCC)     Family History  Problem Relation Age of Onset  . Diabetes Mellitus II Other   . Anesthesia problems Neg Hx     Past Surgical History:  Procedure Laterality Date  . AMPUTATION  07/23/2011   Procedure: AMPUTATION DIGIT;  Surgeon: Newt Minion, MD;  Location: [facility];  Service: Orthopedics;  Laterality: Right;  Right Great Toe Amputation  . AMPUTATION Right 09/18/2012   Procedure: Right Foot 2nd Ray Amputation;  Surgeon: Newt Minion, MD;  Location: [facility];  Service: Orthopedics;  Laterality: Right;  Right Foot 2nd  Ray Amputation  . AMPUTATION Right 10/14/2012   Procedure: AMPUTATION FOOT;  Surgeon: Newt Minion, MD;  Location: [facility];  Service: Orthopedics;  Laterality: Right;  Right Foot Transmetatarsal Amputation  . AMPUTATION Left 09/23/2014   Procedure: Left Foot Transmetatarsal Amputation;  Surgeon: Newt Minion, MD;  Location: [facility];  Service: Orthopedics;  Laterality: Left;  . BACK SURGERY     lower  . CIRCUMCISION  2010  . FOOT FRACTURE SURGERY Left 1970's   "broke it playing football" (10/12/2012)  . HUMERUS FRACTURE SURGERY W/ IMPLANT Right 1960's   "put a plate in it" (QA348G)  . INCISION AND DRAINAGE OF WOUND Left 2006   "foot" (10/12/2012)  . LUMBAR [place] SURGERY  2008   Social History   Occupational History  . Not on file.   Social History Main Topics  . Smoking status: Current Every Day Smoker    Years: 12.00    Types: Pipe  . Smokeless tobacco: Never Used     Comment: 10/12/2012 offered smoking cessation materials; pt declines  . Alcohol use No  . Drug use: No  . Sexual activity: Not Currently

## 2016-09-19 ENCOUNTER — Ambulatory Visit (INDEPENDENT_AMBULATORY_CARE_PROVIDER_SITE_OTHER): Payer: PPO | Admitting: Orthopedic Surgery

## 2016-09-30 ENCOUNTER — Ambulatory Visit (INDEPENDENT_AMBULATORY_CARE_PROVIDER_SITE_OTHER): Payer: PPO | Admitting: Orthopedic Surgery

## 2016-10-03 ENCOUNTER — Ambulatory Visit (INDEPENDENT_AMBULATORY_CARE_PROVIDER_SITE_OTHER): Payer: Medicare Other | Admitting: Orthopedic Surgery

## 2016-10-03 ENCOUNTER — Encounter (INDEPENDENT_AMBULATORY_CARE_PROVIDER_SITE_OTHER): Payer: Self-pay | Admitting: Orthopedic Surgery

## 2016-10-03 DIAGNOSIS — I70209 Unspecified atherosclerosis of native arteries of extremities, unspecified extremity: Secondary | ICD-10-CM | POA: Diagnosis not present

## 2016-10-03 DIAGNOSIS — L97421 Non-pressure chronic ulcer of left heel and midfoot limited to breakdown of skin: Secondary | ICD-10-CM

## 2016-10-03 DIAGNOSIS — E1151 Type 2 diabetes mellitus with diabetic peripheral angiopathy without gangrene: Secondary | ICD-10-CM

## 2016-10-03 DIAGNOSIS — Z89432 Acquired absence of left foot: Secondary | ICD-10-CM

## 2016-10-03 NOTE — Progress Notes (Signed)
Office Visit Note   Patient: [name]           Date of Birth: 30-Apr-1941           MRN: [id number] Visit Date: 10/03/2016              Requested by: Lottie Dawson, MD [address]  [place], [place] [address] PCP: Ileana Roup, MD  Chief Complaint  Patient presents with  . Left Foot - Routine Post Op      HPI: Patient states he has been fitted for new orthotics and new shoes please not wearing them yet. Complains of 2 Wagner grade 1 ulcers on the plantar aspect of his left foot.  Assessment & Plan: Visit Diagnoses:  1. Status post transmetatarsal amputation of left foot ([place])   2. 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 Midfoot ulcer, left, limited to breakdown of skin ([place])     Plan: Ulcer debridement of skin and soft tissue 2 left foot he is to resume wearing his new shoes and new orthotics he is to use antibiotic ointment and a Band-Aid on the wounds daily and follow-up. Office in 3 weeks  Follow-Up Instructions: Return in about 3 weeks (around 10/24/2016).   Ortho Exam  Patient is alert, oriented, no adenopathy, well-dressed, normal affect, normal respiratory effort. Patient has an antalgic gait and uses a cane. Examination patient has 2 Wagner grade 1 ulcers in the plantar aspect the left foot. There is no cellulitis no drainage no odor no signs of infection. After informed consent a 10 blade knife was used to debride the skin and soft tissue back to healthy viable granulation tissue. As the ulcers were 15 mm in diameter and 2 mm deep. There is 100% beefy granulation tissue at the base of the forefoot ulcer. Patient has dorsiflexion of the ankle just past neutral. There are no ischemic changes.  Imaging: No results found.  Labs: Lab Results  Component Value Date   HGBA1C 8.1 (H) 08/23/2014   HGBA1C 11.3 (H) 10/12/2012   HGBA1C greater than 14 02/23/2008   ESRSEDRATE 76 (H) 09/06/2014   ESRSEDRATE 28 (H) 08/23/2014   ESRSEDRATE 88 (H) 10/12/2012   CRP 15.8 (H) 09/06/2014   CRP 2.3 (H) 08/23/2014   REPTSTATUS 10/25/2013 FINAL 10/24/2013   CULT NO GROWTH Performed at [facility] 10/24/2013    Orders:  No orders of the defined types were placed in this encounter.  No orders of the defined types were placed in this encounter.    Procedures: No procedures performed  Clinical Data: No additional findings.  ROS:  All other systems negative, except as noted in the HPI. Review of Systems  Objective: Vital Signs: There were no vitals taken for this visit.  Specialty Comments:  No specialty comments available.  PMFS History: Patient Active Problem List   Diagnosis Date Noted  . Midfoot ulcer, left, limited to breakdown of skin ([place]) 07/22/2016  . 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[place]) 09/23/2014  . Osteomyelitis of ankle or foot, left, acute ([place]) 08/23/2014  . Cellulitis 10/12/2012  . Chest pain 10/12/2012  . Tobacco abuse 10/12/2012  . ABSCESS, AXILLA, LEFT 02/23/2008  . 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 CHEST WALL PAIN, ACUTE 12/03/2007  . CANDIDIASIS, GLANS PENIS 09/16/2007  . HEADACHE 08/04/2007  . 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 Other  and unspecified hyperlipidemia 03/05/2007  . GOUT 03/05/2007  . ERECTILE DYSFUNCTION 03/05/2007  . EXTERNAL HEMORRHOIDS 03/05/2007  . VENTRAL HERNIA 03/05/2007  . FATTY LIVER DISEASE 03/05/2007  . OSTEOARTHRITIS 03/05/2007  . HEMORRHOIDS, INTERNAL 03/17/2005  . COLONIC POLYPS, HX OF 03/17/2005   Past Medical History:  Diagnosis Date  . Arthritis   . High cholesterol   . Hypertension   . Osteomyelitis (HCC)    Right great toe  . Peripheral vascular disease ([place])    diabetic  with osteomylitis  . Type II diabetes mellitus (HCC)     Family History  Problem Relation Age of Onset  . Diabetes Mellitus  II Other   . Anesthesia problems Neg Hx     Past Surgical History:  Procedure Laterality Date  . AMPUTATION  07/23/2011   Procedure: AMPUTATION DIGIT;  Surgeon: Newt Minion, MD;  Location: [facility];  Service: Orthopedics;  Laterality: Right;  Right Great Toe Amputation  . AMPUTATION Right 09/18/2012   Procedure: Right Foot 2nd Ray Amputation;  Surgeon: Newt Minion, MD;  Location: [facility];  Service: Orthopedics;  Laterality: Right;  Right Foot 2nd Ray Amputation  . AMPUTATION Right 10/14/2012   Procedure: AMPUTATION FOOT;  Surgeon: Newt Minion, MD;  Location: [facility];  Service: Orthopedics;  Laterality: Right;  Right Foot Transmetatarsal Amputation  . AMPUTATION Left 09/23/2014   Procedure: Left Foot Transmetatarsal Amputation;  Surgeon: Newt Minion, MD;  Location: [facility];  Service: Orthopedics;  Laterality: Left;  . BACK SURGERY     lower  . CIRCUMCISION  2010  . FOOT FRACTURE SURGERY Left 1970's   "broke it playing football" (10/12/2012)  . HUMERUS FRACTURE SURGERY W/ IMPLANT Right 1960's   "put a plate in it" (9/67/5916)  . INCISION AND DRAINAGE OF WOUND Left 2006   "foot" (10/12/2012)  . LUMBAR [place] SURGERY  2008   Social History   Occupational History  . Not on file.   Social History Main Topics  . Smoking status: Current Every Day Smoker    Years: 12.00    Types: Pipe  . Smokeless tobacco: Never Used     Comment: 10/12/2012 offered smoking cessation materials; pt declines  . Alcohol use No  . Drug use: No  . Sexual activity: Not Currently

## 2016-10-24 ENCOUNTER — Ambulatory Visit (INDEPENDENT_AMBULATORY_CARE_PROVIDER_SITE_OTHER): Payer: Medicare Other | Admitting: Family

## 2016-10-24 ENCOUNTER — Encounter (INDEPENDENT_AMBULATORY_CARE_PROVIDER_SITE_OTHER): Payer: Self-pay | Admitting: Orthopedic Surgery

## 2016-10-24 VITALS — Ht 73.0 in | Wt 212.0 lb

## 2016-10-24 DIAGNOSIS — Z89432 Acquired absence of left foot: Secondary | ICD-10-CM | POA: Diagnosis not present

## 2016-10-24 DIAGNOSIS — E1151 Type 2 diabetes mellitus with diabetic peripheral angiopathy without gangrene: Secondary | ICD-10-CM

## 2016-10-24 DIAGNOSIS — I70209 Unspecified atherosclerosis of native arteries of extremities, unspecified extremity: Secondary | ICD-10-CM

## 2016-10-24 DIAGNOSIS — L97421 Non-pressure chronic ulcer of left heel and midfoot limited to breakdown of skin: Secondary | ICD-10-CM

## 2016-10-24 MED ORDER — SILVER SULFADIAZINE 1 % EX CREA: 1.0000 "application " | TOPICAL_CREAM | Freq: Every day | CUTANEOUS | 0 refills | Status: DC

## 2016-10-24 NOTE — Progress Notes (Signed)
Office Visit Note   Patient: [name]           Date of Birth: [birth year]           MRN: [id number] Visit Date: 10/24/2016              Requested by: Lottie Dawson, MD [address]  [place], [place] [address] PCP: Lottie Dawson, MD  Chief Complaint  Patient presents with  . Left Foot - Follow-up    09/23/14 left transmet amputation      HPI: The patient is a 76 year old gentleman who is seen today in follow-up for ulceration beneath his left transmetatarsal amputation. Amputation done on April 8 of 2016. He is full weightbearing in regular shoes. Does not have a carbon fiber plate or spacer. Does not have a dressing on today. States does not have a appointment and home has not been applying ointment and dressings to the ulcer.  Assessment & Plan: Visit Diagnoses:  1. Midfoot ulcer, left, limited to breakdown of skin ([place])   2. Status post transmetatarsal amputation of left foot ([place])   3. 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 I provided a prescription for Silvadene. He'll cleanse the ulcer daily apply Silvadene dressings daily for wound healing.  Follow-Up Instructions: Return in about 4 weeks (around 11/21/2016).   Ortho Exam  Patient is alert, oriented, no adenopathy, well-dressed, normal affect, normal respiratory effort.   Imaging: No results found.  Labs: Lab Results  Component Value Date   HGBA1C 8.1 (H) 08/23/2014   HGBA1C 11.3 (H) 10/12/2012   HGBA1C greater than 14 02/23/2008   ESRSEDRATE 76 (H) 09/06/2014   ESRSEDRATE 28 (H) 08/23/2014   ESRSEDRATE 88 (H) 10/12/2012   CRP 15.8 (H) 09/06/2014   CRP 2.3 (H) 08/23/2014   REPTSTATUS 10/25/2013 FINAL 10/24/2013   CULT NO GROWTH Performed at [facility] 10/24/2013    Orders:  No orders of the defined types were placed in this encounter.  Meds ordered  this encounter  Medications  . silver sulfADIAZINE (SILVADENE) 1 % cream    Sig: Apply 1 application topically daily.    Dispense:  50 g    Refill:  0     Procedures: No procedures performed  Clinical Data: No additional findings.  ROS:  All other systems negative, except as noted in the HPI. Review of Systems  Constitutional: Negative for chills and fever.    Objective: Vital Signs: Ht 6\' 1"  (1.854 m)   Wt 212 lb (96.2 kg)   BMI 27.97 kg/m   Specialty Comments:  No specialty comments available.  PMFS History: Patient Active Problem List   Diagnosis Date Noted  . Midfoot ulcer, left, limited to breakdown of skin ([place]) 07/22/2016  . 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[place]) 09/23/2014  . Osteomyelitis of ankle or foot, left, acute ([place]) 08/23/2014  . Cellulitis 10/12/2012  . Chest pain 10/12/2012  . Tobacco abuse 10/12/2012  . ABSCESS, AXILLA, LEFT 02/23/2008  . 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 CHEST WALL PAIN, ACUTE 12/03/2007  . CANDIDIASIS, GLANS PENIS 09/16/2007  . HEADACHE 08/04/2007  . 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 Other and unspecified hyperlipidemia 03/05/2007  . GOUT 03/05/2007  . ERECTILE DYSFUNCTION 03/05/2007  . EXTERNAL HEMORRHOIDS 03/05/2007  . VENTRAL HERNIA 03/05/2007  .  FATTY LIVER DISEASE 03/05/2007  . OSTEOARTHRITIS 03/05/2007  . HEMORRHOIDS, INTERNAL 03/17/2005  . COLONIC POLYPS, HX OF 03/17/2005   Past Medical History:  Diagnosis Date  . Arthritis   . High cholesterol   . Hypertension   . Osteomyelitis (HCC)    Right great toe  . Peripheral vascular disease ([place])    diabetic  with osteomylitis  . Type II diabetes mellitus (HCC)     Family History  Problem Relation Age of Onset  . Diabetes Mellitus II Other   . Anesthesia problems Neg Hx     Past Surgical History:  Procedure Laterality Date  . AMPUTATION  07/23/2011   Procedure:  AMPUTATION DIGIT;  Surgeon: Newt Minion, MD;  Location: [facility];  Service: Orthopedics;  Laterality: Right;  Right Great Toe Amputation  . AMPUTATION Right 09/18/2012   Procedure: Right Foot 2nd Ray Amputation;  Surgeon: Newt Minion, MD;  Location: [facility];  Service: Orthopedics;  Laterality: Right;  Right Foot 2nd Ray Amputation  . AMPUTATION Right 10/14/2012   Procedure: AMPUTATION FOOT;  Surgeon: Newt Minion, MD;  Location: [facility];  Service: Orthopedics;  Laterality: Right;  Right Foot Transmetatarsal Amputation  . AMPUTATION Left 09/23/2014   Procedure: Left Foot Transmetatarsal Amputation;  Surgeon: Newt Minion, MD;  Location: [facility];  Service: Orthopedics;  Laterality: Left;  . BACK SURGERY     lower  . CIRCUMCISION  2010  . FOOT FRACTURE SURGERY Left 1970's   "broke it playing football" (10/12/2012)  . HUMERUS FRACTURE SURGERY W/ IMPLANT Right 1960's   "put a plate in it" (3/33/5456)  . INCISION AND DRAINAGE OF WOUND Left 2006   "foot" (10/12/2012)  . LUMBAR [place] SURGERY  2008   Social History   Occupational History  . Not on file.   Social History Main Topics  . Smoking status: Current Every Day Smoker    Years: 12.00    Types: Pipe  . Smokeless tobacco: Never Used     Comment: 10/12/2012 offered smoking cessation materials; pt declines  . Alcohol use No  . Drug use: No  . Sexual activity: Not Currently

## 2016-10-29 ENCOUNTER — Telehealth (INDEPENDENT_AMBULATORY_CARE_PROVIDER_SITE_OTHER): Payer: Self-pay | Admitting: Orthopedic Surgery

## 2016-10-29 NOTE — Telephone Encounter (Signed)
Patient called advised he checked with the pharmacy and the Rx for the cream that is for his foot was not received yet. Patient asked if the Rx can be called in today at [facility]. The number to contact patient is [phone]

## 2016-10-29 NOTE — Telephone Encounter (Signed)
Accidentally medication sent to [facility]. I called [facility] and patient to make aware. Silvadene should be ready for him shortly.

## 2016-11-21 ENCOUNTER — Ambulatory Visit (INDEPENDENT_AMBULATORY_CARE_PROVIDER_SITE_OTHER): Payer: Medicare Other | Admitting: Family

## 2016-12-19 ENCOUNTER — Encounter (INDEPENDENT_AMBULATORY_CARE_PROVIDER_SITE_OTHER): Payer: Self-pay | Admitting: Orthopedic Surgery

## 2016-12-19 ENCOUNTER — Ambulatory Visit (INDEPENDENT_AMBULATORY_CARE_PROVIDER_SITE_OTHER): Payer: Medicare Other | Admitting: Orthopedic Surgery

## 2016-12-19 VITALS — Ht 73.0 in | Wt 212.0 lb

## 2016-12-19 DIAGNOSIS — Z89432 Acquired absence of left foot: Secondary | ICD-10-CM

## 2016-12-19 DIAGNOSIS — L97421 Non-pressure chronic ulcer of left heel and midfoot limited to breakdown of skin: Secondary | ICD-10-CM

## 2016-12-19 MED ORDER — DOXYCYCLINE HYCLATE 100 MG PO TABS: 100.0000 mg | ORAL_TABLET | Freq: Two times a day (BID) | ORAL | 0 refills | Status: DC

## 2016-12-19 NOTE — Progress Notes (Signed)
Office Visit Note   Patient: [name]           Date of Birth: 06-23-1940           MRN: [id number] Visit Date: 12/19/2016              Requested by: Lottie Dawson, MD [address]  [place], [place] [address] PCP: Lottie Dawson, MD  Chief Complaint  Patient presents with  . Left Foot - Follow-up    Left transmetatarsal amputation with plantar ulceration and midfoot callus       HPI: The patient is a 76 year old gentleman who is seen today in follow-up for ulceration beneath his left transmetatarsal amputation. Amputation done on April 8 of 2016. He is full weightbearing in regular shoes. Does not have a carbon fiber plate or spacer.   Assessment & Plan: Visit Diagnoses:  1. Midfoot ulcer, left, limited to breakdown of skin ([place])   2. Status post transmetatarsal amputation of left foot ([place])     Plan: The patient was advised to minimize his weightbearing.  He'll cleanse the ulcer daily apply Silvadene dressings daily for wound healing.  Follow-Up Instructions: Return in about 2 weeks (around 01/02/2017).   Ortho Exam  Patient is alert, oriented, no adenopathy, well-dressed, normal affect, normal respiratory effort. On exam of left foot there is a plantar ulceration with surrounding callus and nonviable tissue. This is beneath 4th MT. Is 25 mm in diameter. Is 2 mm deep. Granulation tissue in wound bed. Was debrided of nonviable tissue with a 10 blade knife. Does have foul odor. No drainage. No erythema or cellulitis. Ulcer beneath arch of foot, callus build up is 2 cm in diameter, was debrided with 10 blade back to viable tissue. No drainage, erythema or sign of infection.  Imaging: No results found.  Labs: Lab Results  Component Value Date   HGBA1C 8.1 (H) 08/23/2014   HGBA1C 11.3 (H) 10/12/2012   HGBA1C greater than 14 02/23/2008   ESRSEDRATE 76 (H) 09/06/2014   ESRSEDRATE 28 (H) 08/23/2014   ESRSEDRATE 88 (H) 10/12/2012   CRP 15.8 (H) 09/06/2014   CRP 2.3 (H) 08/23/2014   REPTSTATUS 10/25/2013 FINAL 10/24/2013   CULT NO GROWTH Performed at [facility] 10/24/2013    Orders:  No orders of the defined types were placed in this encounter.  Meds ordered this encounter  Medications  . DISCONTD: doxycycline (VIBRA-TABS) 100 MG tablet    Sig: Take 1 tablet (100 mg total) by mouth 2 (two) times daily.    Dispense:  28 tablet    Refill:  0  . doxycycline (VIBRA-TABS) 100 MG tablet    Sig: Take 1 tablet (100 mg total) by mouth 2 (two) times daily.    Dispense:  28 tablet    Refill:  0     Procedures: No procedures performed  Clinical Data: No additional findings.  ROS:  All other systems negative, except as noted in the HPI. Review of Systems  Constitutional: Negative for chills and fever.    Objective: Vital Signs: Ht 6\' 1"  (1.854 m)   Wt 212 lb (96.2 kg)   BMI 27.97 kg/m   Specialty Comments:  No specialty comments available.  PMFS History: Patient Active Problem List   Diagnosis Date Noted  . Midfoot ulcer, left, limited to breakdown of skin ([place]) 07/22/2016  . 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[place]) 09/23/2014  . Osteomyelitis of ankle or foot, left, acute ([place]) 08/23/2014  .  Cellulitis 10/12/2012  . Chest pain 10/12/2012  . Tobacco abuse 10/12/2012  . ABSCESS, AXILLA, LEFT 02/23/2008  . 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 CHEST WALL PAIN, ACUTE 12/03/2007  . CANDIDIASIS, GLANS PENIS 09/16/2007  . HEADACHE 08/04/2007  . 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 Other and unspecified hyperlipidemia 03/05/2007  . GOUT 03/05/2007  . ERECTILE DYSFUNCTION 03/05/2007  . EXTERNAL HEMORRHOIDS 03/05/2007  . VENTRAL HERNIA 03/05/2007  . FATTY LIVER DISEASE 03/05/2007  . OSTEOARTHRITIS 03/05/2007  . HEMORRHOIDS, INTERNAL 03/17/2005  . COLONIC POLYPS, HX OF 03/17/2005   Past Medical  History:  Diagnosis Date  . Arthritis   . High cholesterol   . Hypertension   . Osteomyelitis (HCC)    Right great toe  . Peripheral vascular disease ([place])    diabetic  with osteomylitis  . Type II diabetes mellitus (HCC)     Family History  Problem Relation Age of Onset  . Diabetes Mellitus II Other   . Anesthesia problems Neg Hx     Past Surgical History:  Procedure Laterality Date  . AMPUTATION  07/23/2011   Procedure: AMPUTATION DIGIT;  Surgeon: Newt Minion, MD;  Location: [facility];  Service: Orthopedics;  Laterality: Right;  Right Great Toe Amputation  . AMPUTATION Right 09/18/2012   Procedure: Right Foot 2nd Ray Amputation;  Surgeon: Newt Minion, MD;  Location: [facility];  Service: Orthopedics;  Laterality: Right;  Right Foot 2nd Ray Amputation  . AMPUTATION Right 10/14/2012   Procedure: AMPUTATION FOOT;  Surgeon: Newt Minion, MD;  Location: [facility];  Service: Orthopedics;  Laterality: Right;  Right Foot Transmetatarsal Amputation  . AMPUTATION Left 09/23/2014   Procedure: Left Foot Transmetatarsal Amputation;  Surgeon: Newt Minion, MD;  Location: [facility];  Service: Orthopedics;  Laterality: Left;  . BACK SURGERY     lower  . CIRCUMCISION  2010  . FOOT FRACTURE SURGERY Left 1970's   "broke it playing football" (10/12/2012)  . HUMERUS FRACTURE SURGERY W/ IMPLANT Right 1960's   "put a plate in it" (01/09/3663)  . INCISION AND DRAINAGE OF WOUND Left 2006   "foot" (10/12/2012)  . LUMBAR [place] SURGERY  2008   Social History   Occupational History  . Not on file.   Social History Main Topics  . Smoking status: Current Every Day Smoker    Years: 12.00    Types: Pipe  . Smokeless tobacco: Never Used     Comment: 10/12/2012 offered smoking cessation materials; pt declines  . Alcohol use No  . Drug use: No  . Sexual activity: Not Currently

## 2016-12-21 ENCOUNTER — Emergency Department (HOSPITAL_COMMUNITY)
Admission: EM | Admit: 2016-12-21 | Discharge: 2016-12-21 | Disposition: A | Discharge status: Home / Self Care | Payer: Medicare Other | Attending: Emergency Medicine | Admitting: Emergency Medicine

## 2016-12-21 ENCOUNTER — Encounter (HOSPITAL_COMMUNITY): Payer: Self-pay | Admitting: Emergency Medicine

## 2016-12-21 ENCOUNTER — Emergency Department (HOSPITAL_COMMUNITY): Payer: Medicare Other

## 2016-12-21 DIAGNOSIS — Z7984 Long term (current) use of oral hypoglycemic drugs: Secondary | ICD-10-CM | POA: Insufficient documentation

## 2016-12-21 DIAGNOSIS — F17290 Nicotine dependence, other tobacco product, uncomplicated: Secondary | ICD-10-CM | POA: Insufficient documentation

## 2016-12-21 DIAGNOSIS — I10 Essential (primary) hypertension: Secondary | ICD-10-CM | POA: Insufficient documentation

## 2016-12-21 DIAGNOSIS — Z9181 History of falling: Secondary | ICD-10-CM | POA: Insufficient documentation

## 2016-12-21 DIAGNOSIS — Z79899 Other long term (current) drug therapy: Secondary | ICD-10-CM | POA: Diagnosis not present

## 2016-12-21 DIAGNOSIS — E119 Type 2 diabetes mellitus without complications: Secondary | ICD-10-CM | POA: Insufficient documentation

## 2016-12-21 DIAGNOSIS — E7800 Pure hypercholesterolemia, unspecified: Secondary | ICD-10-CM | POA: Diagnosis not present

## 2016-12-21 DIAGNOSIS — M25532 Pain in left wrist: Secondary | ICD-10-CM | POA: Insufficient documentation

## 2016-12-21 MED ORDER — HYDROCODONE-ACETAMINOPHEN 5-325 MG PO TABS: 1.0000 | ORAL_TABLET | Freq: Four times a day (QID) | ORAL | 0 refills | Status: DC | PRN

## 2016-12-21 MED ORDER — ACETAMINOPHEN 325 MG PO TABS
650.0000 mg | ORAL_TABLET | Freq: Once | ORAL | Status: AC
  Administered 2016-12-21: 650 mg via ORAL
  Filled 2016-12-21: qty 2

## 2016-12-21 NOTE — Discharge Instructions (Signed)
See your Physician for recheck in 3-4 days °

## 2016-12-21 NOTE — ED Provider Notes (Signed)
Patient seen/examined in the Emergency Department in conjunction with Midlevel Provider [place] Patient reports left wrist pain from fall 2 weeks ago.  He has not seen a medical provider for this as of yet Denies head/neck injury.  No other acute complaints Exam : awake/alert, no distress, tenderness to left wrist, no deformity.   Plan: imaging and then likely discharge home    Ripley Fraise, MD 12/21/16 [phone]

## 2016-12-21 NOTE — ED Provider Notes (Signed)
[facility] DEPT Provider Note   CSN: [id number] Arrival date & time: 12/21/16  0623     History   Chief Complaint Chief Complaint  Patient presents with  . Fall    Hand Injury    HPI [name] is a 76 y.o. male.  The history is provided by the patient. No language interpreter was used.  Fall  This is a new problem. Episode onset: 2 weeks ago. The problem occurs constantly. The problem has been gradually worsening. Nothing aggravates the symptoms. Nothing relieves the symptoms. He has tried nothing for the symptoms. The treatment provided no relief.   Pt reports he fell 2 weeks ago and injured his left ahnd and left wrist.   Past Medical History:  Diagnosis Date  . Arthritis   . High cholesterol   . Hypertension   . Osteomyelitis (HCC)    Right great toe  . Peripheral vascular disease ([place])    diabetic  with osteomylitis  . Type II diabetes mellitus [facility])     Patient Active Problem List   Diagnosis Date Noted  . Midfoot ulcer, left, limited to breakdown of skin ([place]) 07/22/2016  . 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 Osteomyelitis of ankle or foot, left, acute ([place]) 08/23/2014  . Cellulitis 10/12/2012  . Chest pain 10/12/2012  . Tobacco abuse 10/12/2012  . ABSCESS, AXILLA, LEFT 02/23/2008  . 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 CHEST WALL PAIN, ACUTE 12/03/2007  . CANDIDIASIS, GLANS PENIS 09/16/2007  . HEADACHE 08/04/2007  . 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 Other and unspecified hyperlipidemia 03/05/2007  . GOUT 03/05/2007  . ERECTILE DYSFUNCTION 03/05/2007  . EXTERNAL HEMORRHOIDS 03/05/2007  . VENTRAL HERNIA 03/05/2007  . FATTY LIVER DISEASE 03/05/2007  . OSTEOARTHRITIS 03/05/2007  . HEMORRHOIDS, INTERNAL 03/17/2005  . COLONIC POLYPS, HX OF 03/17/2005    Past Surgical History:  Procedure Laterality Date  . AMPUTATION  07/23/2011   Procedure: AMPUTATION DIGIT;  Surgeon: Newt Minion, MD;  Location: [facility];  Service: Orthopedics;  Laterality: Right;  Right Great Toe Amputation  . AMPUTATION Right 09/18/2012   Procedure: Right Foot 2nd Ray Amputation;  Surgeon: Newt Minion, MD;  Location: [place];  Service: Orthopedics;  Laterality: Right;  Right Foot 2nd Ray Amputation  . AMPUTATION Right 10/14/2012   Procedure: AMPUTATION FOOT;  Surgeon: Newt Minion, MD;  Location: [place];  Service: Orthopedics;  Laterality: Right;  Right Foot Transmetatarsal Amputation  . AMPUTATION Left 09/23/2014   Procedure: Left Foot Transmetatarsal Amputation;  Surgeon: Newt Minion, MD;  Location: [place];  Service: Orthopedics;  Laterality: Left;  . BACK SURGERY     lower  . CIRCUMCISION  2010  . FOOT FRACTURE SURGERY Left 1970's   "broke it playing football" (10/12/2012)  . HUMERUS FRACTURE SURGERY W/ IMPLANT Right 1960's   "put a plate in it" (7/62/8315)  . INCISION AND DRAINAGE OF WOUND Left 2006   "foot" (10/12/2012)  . [place] SURGERY  2008       Home Medications    Prior to Admission medications   Medication Sig Start Date End Date Taking? Authorizing Provider  acetaminophen (TYLENOL) 500 MG tablet Take 1,000 mg by mouth every 6 (six) hours as needed for mild pain.    [provider]  Cyanocobalamin (VITAMIN B-12 PO) Take 1 tablet by mouth daily. 2000 mcg    [provider]  dorzolamide-timolol (  COSOPT) 22.3-6.8 MG/ML ophthalmic solution Place 1 drop into both eyes daily. 08/16/14   [provider]  doxycycline (VIBRA-TABS) 100 MG tablet Take 1 tablet (100 mg total) by mouth 2 (two) times daily. 12/19/16   Suzan Slick, NP  DUREZOL 0.05 % EMUL Place 1 drop into the right eye 2 (two) times daily. 08/27/14   [provider]  gabapentin (NEURONTIN) 100 MG capsule Take 100 mg by mouth 2 (two) times daily.     [provider]  glipiZIDE (GLUCOTROL) 5 MG tablet Take 5 mg by mouth 2 (two) times daily  before a meal.    [provider]  HYDROcodone-acetaminophen (NORCO/VICODIN) 5-325 MG per tablet Take 1 tablet by mouth every 4 (four) hours as needed for moderate pain or severe pain (pain).     [provider]  ketorolac (ACULAR) 0.4 % SOLN Place 1 drop into both eyes 4 (four) times daily. 09/15/14   [provider]  latanoprost (XALATAN) 0.005 % ophthalmic solution Place 1 drop into the right eye at bedtime. 09/12/14   [provider]  lisinopril (PRINIVIL,ZESTRIL) 20 MG tablet Take 20 mg by mouth every morning.     [provider]  metFORMIN (GLUCOPHAGE) 1000 MG tablet Take 1,000 mg by mouth daily with breakfast.     [provider]  ondansetron (ZOFRAN ODT) 4 MG disintegrating tablet Take 1 tablet (4 mg total) by mouth every 8 (eight) hours as needed for nausea or vomiting. 08/25/14   Carmin Muskrat, MD  pyridOXINE (VITAMIN B-6) 100 MG tablet Take 100 mg by mouth daily.    [provider]  silver sulfADIAZINE (SILVADENE) 1 % cream Apply 1 application topically daily. 10/24/16   Suzan Slick, NP    Family History Family History  Problem Relation Age of Onset  . Diabetes Mellitus II Other   . Anesthesia problems Neg Hx     Social History Social History  Substance Use Topics  . Smoking status: Current Every Day Smoker    Years: 12.00    Types: Pipe  . Smokeless tobacco: Never Used     Comment: 10/12/2012 offered smoking cessation materials; pt declines  . Alcohol use No     Allergies   Patient has no known allergies.   Review of Systems Review of Systems  Musculoskeletal: Positive for back pain and joint swelling.  All other systems reviewed and are negative.    Physical Exam Updated Vital Signs BP 110/66   Pulse (!) 58   Temp 98.4 F (36.9 C) (Oral)   Resp 16   SpO2 100%   Physical Exam  Constitutional: He appears well-developed and well-nourished.  HENT:  Head: Normocephalic.  Musculoskeletal:  Normal range of motion. He exhibits tenderness.  Tender left wrist, pain with range of motion,  nv and ns intact,  No deformity    Neurological: He is alert.  Skin: Skin is warm.  Psychiatric: He has a normal mood and affect.  Nursing note and vitals reviewed.    ED Treatments / Results  Labs (all labs ordered are listed, but only abnormal results are displayed) Labs Reviewed - No data to display  EKG  EKG Interpretation None       Radiology Dg Wrist Complete Left  Result Date: 12/21/2016 CLINICAL DATA:  Left hand and wrist pain and swelling after fall 2 weeks ago. Initial encounter. EXAM: LEFT WRIST - COMPLETE 3+ VIEW COMPARISON:  None. FINDINGS: No acute fracture dislocation. First CMC arthropathy with  joint narrowing and productive change. Cystic changes appear subchondral and degenerative. Arterial calcification. Nonspecific faint calcification in the first interspace. IMPRESSION: 1. No acute osseous finding. 2. First CMC arthropathy with degenerative appearance. Electronically Signed   By: Monte Fantasia M.D.   On: 12/21/2016 07:04   Dg Hand Complete Left  Result Date: 12/21/2016 CLINICAL DATA:  Left hand and wrist pain and swelling after fall 2 weeks ago. Initial encounter. EXAM: LEFT HAND - COMPLETE 3+ VIEW COMPARISON:  None. FINDINGS: There is no evidence of fracture or dislocation. First CMC arthropathy with central sub chondral appearing cysts. No suspected erosive changes. Ossification in the first interspace may reflect an ossicle. IMPRESSION: 1. No acute finding. 2. First CMC arthropathy, degenerative appearing. Electronically Signed   By: Monte Fantasia M.D.   On: 12/21/2016 07:05    Procedures Procedures (including critical care time)  Medications Ordered in ED Medications  acetaminophen (TYLENOL) tablet 650 mg (650 mg Oral Given 12/21/16 0701)     Initial Impression / Assessment and Plan / ED Course  I have reviewed the triage vital signs and the nursing  notes.  Pertinent labs & imaging results that were available during my care of the patient were reviewed by me and considered in my medical decision making (see chart for details).     Pt counseled on degenerative cahnges.  I will give splint.   Pt advised to see his primary MD for recheck in 1 week.    Final Clinical Impressions(s) / ED Diagnoses   Final diagnoses:  Wrist pain, left    New Prescriptions New Prescriptions   HYDROCODONE-ACETAMINOPHEN (NORCO/VICODIN) 5-325 MG TABLET    Take 1 tablet by mouth every 6 (six) hours as needed.  An After Visit Summary was printed and given to the patient.   Fransico Meadow, [place] 12/21/16 7048    Carmin Muskrat, MD 12/23/16 [phone]

## 2016-12-21 NOTE — ED Triage Notes (Signed)
Patient tripped and fell at home 2 weeks ago , denies LOC/ambulatory , reports pain with swelling art left hand .

## 2016-12-29 ENCOUNTER — Emergency Department (HOSPITAL_COMMUNITY)
Admission: EM | Admit: 2016-12-29 | Discharge: 2016-12-29 | Disposition: A | Discharge status: Home / Self Care | Payer: Medicare Other | Attending: Emergency Medicine | Admitting: Emergency Medicine

## 2016-12-29 ENCOUNTER — Encounter (HOSPITAL_COMMUNITY): Payer: Self-pay | Admitting: *Deleted

## 2016-12-29 DIAGNOSIS — E119 Type 2 diabetes mellitus without complications: Secondary | ICD-10-CM | POA: Insufficient documentation

## 2016-12-29 DIAGNOSIS — F17210 Nicotine dependence, cigarettes, uncomplicated: Secondary | ICD-10-CM | POA: Insufficient documentation

## 2016-12-29 DIAGNOSIS — M79642 Pain in left hand: Secondary | ICD-10-CM

## 2016-12-29 DIAGNOSIS — Z79899 Other long term (current) drug therapy: Secondary | ICD-10-CM | POA: Insufficient documentation

## 2016-12-29 DIAGNOSIS — Z7984 Long term (current) use of oral hypoglycemic drugs: Secondary | ICD-10-CM | POA: Diagnosis not present

## 2016-12-29 MED ORDER — HYDROCODONE-ACETAMINOPHEN 5-325 MG PO TABS
1.0000 | ORAL_TABLET | Freq: Once | ORAL | Status: AC
  Administered 2016-12-29: 1 via ORAL
  Filled 2016-12-29: qty 1

## 2016-12-29 NOTE — ED Provider Notes (Signed)
[facility] DEPT Provider Note   CSN: [id number] Arrival date & time: 12/29/16  1024     History   Chief Complaint Chief Complaint  Patient presents with  . Hand Pain    HPI [name] is a 76 y.o. male.Patient seen here 12/21/2016 after he tripped and fell injured his left hand and wrist. He had x-ray performed which showed no acute disease, he received a Velcro splint and was prescribed Norco. He reports that he's run out of Norco. He receives 120 Norco tablets every 30 days. Last prescription was written 12/12/2016.[place] Controlled Substance reporting System queried pain in left wrist and left hand worse with moving his hand. Improved with remains still. He did have a "osplint which he took off because it itches. No new injury  HPI  Past Medical History:  Diagnosis Date  . Arthritis   . High cholesterol   . Hypertension   . Osteomyelitis (HCC)    Right great toe  . Peripheral vascular disease ([place])    diabetic  with osteomylitis  . Type II diabetes mellitus [facility])     Patient Active Problem List   Diagnosis Date Noted  . Midfoot ulcer, left, limited to breakdown of skin ([place]) 07/22/2016  . 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[place]) 09/23/2014  . Osteomyelitis of ankle or foot, left, acute ([place]) 08/23/2014  . Cellulitis 10/12/2012  . Chest pain 10/12/2012  . Tobacco abuse 10/12/2012  . ABSCESS, AXILLA, LEFT 02/23/2008  . 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 CHEST WALL PAIN, ACUTE 12/03/2007  . CANDIDIASIS, GLANS PENIS 09/16/2007  . HEADACHE 08/04/2007  . 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 Other and unspecified hyperlipidemia 03/05/2007  . GOUT 03/05/2007  . ERECTILE DYSFUNCTION 03/05/2007  . EXTERNAL HEMORRHOIDS 03/05/2007  . VENTRAL HERNIA 03/05/2007  . FATTY LIVER DISEASE 03/05/2007  . OSTEOARTHRITIS 03/05/2007  . HEMORRHOIDS, INTERNAL 03/17/2005  . COLONIC  POLYPS, HX OF 03/17/2005    Past Surgical History:  Procedure Laterality Date  . AMPUTATION  07/23/2011   Procedure: AMPUTATION DIGIT;  Surgeon: Newt Minion, MD;  Location: [place];  Service: Orthopedics;  Laterality: Right;  Right Great Toe Amputation  . AMPUTATION Right 09/18/2012   Procedure: Right Foot 2nd Ray Amputation;  Surgeon: Newt Minion, MD;  Location: [place];  Service: Orthopedics;  Laterality: Right;  Right Foot 2nd Ray Amputation  . AMPUTATION Right 10/14/2012   Procedure: AMPUTATION FOOT;  Surgeon: Newt Minion, MD;  Location: [place];  Service: Orthopedics;  Laterality: Right;  Right Foot Transmetatarsal Amputation  . AMPUTATION Left 09/23/2014   Procedure: Left Foot Transmetatarsal Amputation;  Surgeon: Newt Minion, MD;  Location: [facility];  Service: Orthopedics;  Laterality: Left;  . BACK SURGERY     lower  . CIRCUMCISION  2010  . FOOT FRACTURE SURGERY Left 1970's   "broke it playing football" (10/12/2012)  . HUMERUS FRACTURE SURGERY W/ IMPLANT Right 1960's   "put a plate in it" (3/33/5456)  . INCISION AND DRAINAGE OF WOUND Left 2006   "foot" (10/12/2012)  . [place] SURGERY  2008       Home Medications    Prior to Admission medications   Medication Sig Start Date End Date Taking? Authorizing Provider  acetaminophen (TYLENOL) 500 MG tablet Take 1,000 mg by mouth every 6 (six) hours as needed for mild pain.    [provider]  Cyanocobalamin (VITAMIN B-12 PO) Take  1 tablet by mouth daily. 2000 mcg    [provider]  dorzolamide-timolol (COSOPT) 22.3-6.8 MG/ML ophthalmic solution Place 1 drop into both eyes daily. 08/16/14   [provider]  doxycycline (VIBRA-TABS) 100 MG tablet Take 1 tablet (100 mg total) by mouth 2 (two) times daily. 12/19/16   Suzan Slick, NP  DUREZOL 0.05 % EMUL Place 1 drop into the right eye 2 (two) times daily. 08/27/14   [provider]  gabapentin (NEURONTIN) 100 MG capsule Take 100 mg by mouth 2 (two) times  daily.     [provider]  glipiZIDE (GLUCOTROL) 5 MG tablet Take 5 mg by mouth 2 (two) times daily before a meal.    [provider]  HYDROcodone-acetaminophen (NORCO/VICODIN) 5-325 MG tablet Take 1 tablet by mouth every 6 (six) hours as needed. 12/21/16   Fransico Meadow, PA-C  ketorolac (ACULAR) 0.4 % SOLN Place 1 drop into both eyes 4 (four) times daily. 09/15/14   [provider]  latanoprost (XALATAN) 0.005 % ophthalmic solution Place 1 drop into the right eye at bedtime. 09/12/14   [provider]  lisinopril (PRINIVIL,ZESTRIL) 20 MG tablet Take 20 mg by mouth every morning.     [provider]  metFORMIN (GLUCOPHAGE) 1000 MG tablet Take 1,000 mg by mouth daily with breakfast.     [provider]  ondansetron (ZOFRAN ODT) 4 MG disintegrating tablet Take 1 tablet (4 mg total) by mouth every 8 (eight) hours as needed for nausea or vomiting. 08/25/14   Carmin Muskrat, MD  pyridOXINE (VITAMIN B-6) 100 MG tablet Take 100 mg by mouth daily.    [provider]  silver sulfADIAZINE (SILVADENE) 1 % cream Apply 1 application topically daily. 10/24/16   Suzan Slick, NP    Family History Family History  Problem Relation Age of Onset  . Diabetes Mellitus II Other   . Anesthesia problems Neg Hx     Social History Social History  Substance Use Topics  . Smoking status: Current Every Day Smoker    Years: 12.00    Types: Pipe  . Smokeless tobacco: Never Used     Comment: 10/12/2012 offered smoking cessation materials; pt declines  . Alcohol use No     Allergies   Patient has no known allergies.   Review of Systems Review of Systems  Constitutional: Negative.   HENT: Negative.   Respiratory: Negative.   Cardiovascular: Negative.   Gastrointestinal: Negative.   Musculoskeletal: Positive for arthralgias.       Chronic bilateral foot pain.  Skin: Positive for wound.       Ulcer at distal stump of left foot otherwise without  lesion  Allergic/Immunologic: Positive for immunocompromised state.       Diabetic  Neurological: Negative.   Psychiatric/Behavioral: Negative.      Physical Exam Updated Vital Signs BP 127/79   Pulse 69   Temp 98.9 F (37.2 C) (Oral)   Resp 18   Ht 6\' 1"  (1.854 m)   Wt 96.2 kg (212 lb)   SpO2 100%   BMI 27.97 kg/m   Physical Exam  Constitutional:  Chronically ill-appearing  HENT:  Head: Normocephalic and atraumatic.  Eyes: EOM are normal.  Neck: Neck supple.  Cardiovascular: Normal rate.   Pulmonary/Chest: Effort normal.  Abdominal: He exhibits no distension.  Musculoskeletal:  Left upper extremity no swelling no redness radial pulse 2+. Limited range of motion of hand and wrist secondary to pain. No tenderness and  anatomic snuff Good capillary refill. Left lower extremity distal one third of foot amputated with dime sized clean-appearing ulcer at plantar surface of foot, distal aspect. Right lower extremity with distal one third of foot amputated. Stump appears clean. With no wound. Right upper extremity without redness swelling or tenderness neurovascular intact  Nursing note and vitals reviewed.    ED Treatments / Results  Labs (all labs ordered are listed, but only abnormal results are displayed) Labs Reviewed - No data to display  EKG  EKG Interpretation None       Radiology No results found.  Procedures Procedures (including critical care time)  Medications Ordered in ED Medications  HYDROcodone-acetaminophen (NORCO/VICODIN) 5-325 MG per tablet 1 tablet (not administered)    X-rays from 12/21/2016 viewed by me Plan he'll receive 1 Norco tablet here no prescriptions. He is referred to Dr. Ninfa Linden, hand surgeon on call. He is encouraged to wear splint for comfort Initial Impression / Assessment and Plan / ED Course  I have reviewed the triage vital signs and the nursing notes.  Pertinent labs & imaging results that were available during my care  of the patient were reviewed by me and considered in my medical decision making (see chart for details).       Final Clinical Impressions(s) / ED Diagnoses  Diagnosis #1 fall #2 pain in left hand and left wrist Final diagnoses:  None    New Prescriptions New Prescriptions   No medications on file     Orlie Dakin, MD 12/29/16 1153

## 2016-12-29 NOTE — Discharge Instructions (Signed)
Wear your splint as needed for comfort. Take your hydrocodone as needed for bad pain. Call Dr. Ninfa Linden tomorrow to schedule an office visit

## 2016-12-29 NOTE — ED Notes (Addendum)
Wound to left foot dressed with non adherent padded dressing.

## 2016-12-29 NOTE — ED Triage Notes (Signed)
Pt arrived via EMS with reported on going Lt wrist and Lt hand pain.  Pt reports he fell 2 weeks ago and was seen in ED and DC with a splint. Pt reports he has not worn splint because he broke out  .

## 2016-12-30 ENCOUNTER — Encounter (INDEPENDENT_AMBULATORY_CARE_PROVIDER_SITE_OTHER): Payer: Self-pay | Admitting: Family

## 2016-12-30 ENCOUNTER — Ambulatory Visit (INDEPENDENT_AMBULATORY_CARE_PROVIDER_SITE_OTHER): Payer: Medicare Other | Admitting: Orthopaedic Surgery

## 2016-12-30 VITALS — Ht 73.0 in | Wt 212.0 lb

## 2016-12-30 DIAGNOSIS — M79642 Pain in left hand: Secondary | ICD-10-CM

## 2016-12-30 DIAGNOSIS — M1812 Unilateral primary osteoarthritis of first carpometacarpal joint, left hand: Secondary | ICD-10-CM | POA: Diagnosis not present

## 2016-12-30 MED ORDER — BUPIVACAINE HCL 0.5 % IJ SOLN
0.3300 mL | INTRAMUSCULAR | Status: AC | PRN
  Administered 2016-12-30: .33 mL

## 2016-12-30 MED ORDER — LIDOCAINE HCL 1 % IJ SOLN
0.3000 mL | INTRAMUSCULAR | Status: AC | PRN
  Administered 2016-12-30: .3 mL

## 2016-12-30 MED ORDER — METHYLPREDNISOLONE ACETATE 40 MG/ML IJ SUSP
13.3300 mg | INTRAMUSCULAR | Status: AC | PRN
  Administered 2016-12-30: 13.33 mg

## 2016-12-30 MED ORDER — NAPROXEN 500 MG PO TABS: 500.0000 mg | ORAL_TABLET | Freq: Two times a day (BID) | ORAL | 0 refills | Status: DC | PRN

## 2016-12-30 NOTE — Progress Notes (Signed)
Office Visit Note   Patient: [name]           Date of Birth: [birth year]           MRN: [id number] Visit Date: 12/30/2016              Requested by: Lottie Dawson, MD [address]  [place], [place] [id number] PCP: Lottie Dawson, MD   Assessment & Plan: Visit Diagnoses:  1. Arthritis of carpometacarpal (CMC) joint of left thumb     Plan: Overall impression is left thumb CMC arthritis. Injection was performed today. Thumb abduction brace was also provided. Questions encouraged and answered. Follow-up with me as needed.  Follow-Up Instructions: Return if symptoms worsen or fail to improve.   Orders:  No orders of the defined types were placed in this encounter.  Meds ordered this encounter  Medications  . naproxen (NAPROSYN) 500 MG tablet    Sig: Take 1 tablet (500 mg total) by mouth 2 (two) times daily as needed for mild pain or moderate pain. With meals    Dispense:  60 tablet    Refill:  0      Procedures: Hand/UE Inj Date/Time: 12/30/2016 11:18 AM Performed by: Leandrew Koyanagi Authorized by: Leandrew Koyanagi   Consent Given by:  Patient Timeout: prior to procedure the correct patient, procedure, and site was verified   Indications:  Pain Condition: osteoarthritis   Location:  Thumb Thumb joint: CMC joint. Prep: patient was prepped and draped in usual sterile fashion   Needle Size:  25 G Medications:  0.3 mL lidocaine 1 %; 0.33 mL bupivacaine 0.5 %; 13.33 mg methylPREDNISolone acetate 40 MG/ML     Clinical Data: No additional findings.   Subjective: Chief Complaint  Patient presents with  . Left Wrist - Pain  . Left Hand - Pain    Patient comes in today with left thumb pain status post fall. He has been to the ER twice. X-rays were negative and showed mainly thumb CMC arthritis. He comes in today for further evaluation and treatment. Denies any numbness or tingling.    Review of Systems  Constitutional:  Negative.   All other systems reviewed and are negative.    Objective: Vital Signs: Ht 6\' 1"  (1.854 m)   Wt 212 lb (96.2 kg)   BMI 27.97 kg/m   Physical Exam  Constitutional: He is oriented to person, place, and time. He appears well-developed and well-nourished.  Pulmonary/Chest: Effort normal.  Abdominal: Soft.  Neurological: He is alert and oriented to person, place, and time.  Skin: Skin is warm.  Psychiatric: He has a normal mood and affect. His behavior is normal. Judgment and thought content normal.  Nursing note and vitals reviewed.   Ortho Exam Left thumb exam shows positive grind test. No evidence of triggering Finkelstein's. Specialty Comments:  No specialty comments available.  Imaging: No results found.   PMFS History: Patient Active Problem List   Diagnosis Date Noted  . Arthritis of carpometacarpal [facility]) joint of left thumb 12/30/2016  . Midfoot ulcer, left, limited to breakdown of skin ([place]) 07/22/2016  . 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[place]) 09/23/2014  . Osteomyelitis of ankle or foot, left, acute ([place]) 08/23/2014  . Cellulitis 10/12/2012  . Chest pain 10/12/2012  . Tobacco abuse 10/12/2012  . ABSCESS, AXILLA, LEFT 02/23/2008  . 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 CHEST WALL PAIN, ACUTE 12/03/2007  . CANDIDIASIS, GLANS PENIS 09/16/2007  . HEADACHE 08/04/2007  .  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 Other and unspecified hyperlipidemia 03/05/2007  . GOUT 03/05/2007  . ERECTILE DYSFUNCTION 03/05/2007  . EXTERNAL HEMORRHOIDS 03/05/2007  . VENTRAL HERNIA 03/05/2007  . FATTY LIVER DISEASE 03/05/2007  . OSTEOARTHRITIS 03/05/2007  . HEMORRHOIDS, INTERNAL 03/17/2005  . COLONIC POLYPS, HX OF 03/17/2005   Past Medical History:  Diagnosis Date  . Arthritis   . High cholesterol   . Hypertension   . Osteomyelitis (HCC)    Right great toe  . Peripheral  vascular disease ([place])    diabetic  with osteomylitis  . Type II diabetes mellitus (HCC)     Family History  Problem Relation Age of Onset  . Diabetes Mellitus II Other   . Anesthesia problems Neg Hx     Past Surgical History:  Procedure Laterality Date  . AMPUTATION  07/23/2011   Procedure: AMPUTATION DIGIT;  Surgeon: Newt Minion, MD;  Location: [place];  Service: Orthopedics;  Laterality: Right;  Right Great Toe Amputation  . AMPUTATION Right 09/18/2012   Procedure: Right Foot 2nd Ray Amputation;  Surgeon: Newt Minion, MD;  Location: [place];  Service: Orthopedics;  Laterality: Right;  Right Foot 2nd Ray Amputation  . AMPUTATION Right 10/14/2012   Procedure: AMPUTATION FOOT;  Surgeon: Newt Minion, MD;  Location: [place];  Service: Orthopedics;  Laterality: Right;  Right Foot Transmetatarsal Amputation  . AMPUTATION Left 09/23/2014   Procedure: Left Foot Transmetatarsal Amputation;  Surgeon: Newt Minion, MD;  Location: [place];  Service: Orthopedics;  Laterality: Left;  . BACK SURGERY     lower  . CIRCUMCISION  2010  . FOOT FRACTURE SURGERY Left 1970's   "broke it playing football" (10/12/2012)  . HUMERUS FRACTURE SURGERY W/ IMPLANT Right 1960's   "put a plate in it" (09/30/6061)  . INCISION AND DRAINAGE OF WOUND Left 2006   "foot" (10/12/2012)  . LUMBAR [place] SURGERY  2008   Social History   Occupational History  . Not on file.   Social History Main Topics  . Smoking status: Current Every Day Smoker    Years: 12.00    Types: Pipe  . Smokeless tobacco: Never Used     Comment: 10/12/2012 offered smoking cessation materials; pt declines  . Alcohol use No  . Drug use: No  . Sexual activity: Not Currently

## 2017-01-02 ENCOUNTER — Ambulatory Visit (INDEPENDENT_AMBULATORY_CARE_PROVIDER_SITE_OTHER): Payer: Medicare Other | Admitting: Family

## 2017-01-02 ENCOUNTER — Encounter (INDEPENDENT_AMBULATORY_CARE_PROVIDER_SITE_OTHER): Payer: Self-pay | Admitting: Family

## 2017-01-02 VITALS — Ht 73.0 in | Wt 212.0 lb

## 2017-01-02 DIAGNOSIS — Z89432 Acquired absence of left foot: Secondary | ICD-10-CM

## 2017-01-02 DIAGNOSIS — Z720 Tobacco use: Secondary | ICD-10-CM

## 2017-01-02 DIAGNOSIS — L97421 Non-pressure chronic ulcer of left heel and midfoot limited to breakdown of skin: Secondary | ICD-10-CM | POA: Diagnosis not present

## 2017-01-02 NOTE — Progress Notes (Signed)
Office Visit Note   Patient: [name]           Date of Birth: 06/08/1941           MRN: [id number] Visit Date: 01/02/2017              Requested by: Lottie Dawson, MD [place]  [place], [place] [address] PCP: Lottie Dawson, MD  Chief Complaint  Patient presents with  . Left Foot - Wound Check    Left foot transmetatarsal plantar ulceration.       HPI: The patient is a 76 year old gentleman who is seen today in follow-up for ulceration beneath his left transmetatarsal amputation. Amputation done on April 8 of 2016. He is full weightbearing in regular shoes. Does not have a carbon fiber plate or spacer. Is in new balance walking shoes today. States he would like to get some new sketcher's to see how those do for him.  Assessment & Plan: Visit Diagnoses:  1. Midfoot ulcer, left, limited to breakdown of skin ([place])   2. Status post transmetatarsal amputation of left foot ([place])   3. Tobacco abuse     Plan: The patient was advised to minimize his weightbearing on the left.  He'll cleanse the ulcer daily apply Silvadene dressings daily for wound healing.  Follow-Up Instructions: Return in about 4 weeks (around 01/30/2017).   Ortho Exam  Patient is alert, oriented, no adenopathy, well-dressed, normal affect, normal respiratory effort. On exam of left foot there is a plantar ulceration with surrounding callus and nonviable tissue. This is beneath 4th MT. Callus is 20 mm in diameter with central ulceration that is 1 cm in diameter. Is 2 mm deep. Granulation tissue in wound bed. Was debrided of nonviable tissue with a 10 blade knife. Does have foul odor. No drainage. No erythema or cellulitis. Ulcer beneath arch of foot, callus build up is 2 cm in diameter, was debrided with 10 blade back to viable tissue. No drainage, erythema or sign of infection.  Imaging: No results found.  Labs: Lab Results  Component Value Date   HGBA1C 8.1 (H)  08/23/2014   HGBA1C 11.3 (H) 10/12/2012   HGBA1C greater than 14 02/23/2008   ESRSEDRATE 76 (H) 09/06/2014   ESRSEDRATE 28 (H) 08/23/2014   ESRSEDRATE 88 (H) 10/12/2012   CRP 15.8 (H) 09/06/2014   CRP 2.3 (H) 08/23/2014   REPTSTATUS 10/25/2013 FINAL 10/24/2013   CULT NO GROWTH Performed at [facility] 10/24/2013    Orders:  No orders of the defined types were placed in this encounter.  No orders of the defined types were placed in this encounter.    Procedures: No procedures performed  Clinical Data: No additional findings.  ROS:  All other systems negative, except as noted in the HPI. Review of Systems  Constitutional: Negative for chills and fever.    Objective: Vital Signs: Ht 6\' 1"  (1.854 m)   Wt 212 lb (96.2 kg)   BMI 27.97 kg/m   Specialty Comments:  No specialty comments available.  PMFS History: Patient Active Problem List   Diagnosis Date Noted  . Arthritis of carpometacarpal [facility]) joint of left thumb 12/30/2016  . Midfoot ulcer, left, limited to breakdown of skin ([place]) 07/22/2016  . 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[place]) 09/23/2014  . Osteomyelitis of ankle or foot, left, acute ([place]) 08/23/2014  . Cellulitis 10/12/2012  . Chest pain 10/12/2012  . Tobacco abuse 10/12/2012  . ABSCESS, AXILLA, LEFT 02/23/2008  .  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 CHEST WALL PAIN, ACUTE 12/03/2007  . CANDIDIASIS, GLANS PENIS 09/16/2007  . HEADACHE 08/04/2007  . 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 Other and unspecified hyperlipidemia 03/05/2007  . GOUT 03/05/2007  . ERECTILE DYSFUNCTION 03/05/2007  . EXTERNAL HEMORRHOIDS 03/05/2007  . VENTRAL HERNIA 03/05/2007  . FATTY LIVER DISEASE 03/05/2007  . OSTEOARTHRITIS 03/05/2007  . HEMORRHOIDS, INTERNAL 03/17/2005  . COLONIC POLYPS, HX OF 03/17/2005   Past Medical History:  Diagnosis Date  . Arthritis   . High  cholesterol   . Hypertension   . Osteomyelitis (HCC)    Right great toe  . Peripheral vascular disease ([place])    diabetic  with osteomylitis  . Type II diabetes mellitus (HCC)     Family History  Problem Relation Age of Onset  . Diabetes Mellitus II Other   . Anesthesia problems Neg Hx     Past Surgical History:  Procedure Laterality Date  . AMPUTATION  07/23/2011   Procedure: AMPUTATION DIGIT;  Surgeon: Newt Minion, MD;  Location: [place];  Service: Orthopedics;  Laterality: Right;  Right Great Toe Amputation  . AMPUTATION Right 09/18/2012   Procedure: Right Foot 2nd Ray Amputation;  Surgeon: Newt Minion, MD;  Location: [place];  Service: Orthopedics;  Laterality: Right;  Right Foot 2nd Ray Amputation  . AMPUTATION Right 10/14/2012   Procedure: AMPUTATION FOOT;  Surgeon: Newt Minion, MD;  Location: [place];  Service: Orthopedics;  Laterality: Right;  Right Foot Transmetatarsal Amputation  . AMPUTATION Left 09/23/2014   Procedure: Left Foot Transmetatarsal Amputation;  Surgeon: Newt Minion, MD;  Location: [place];  Service: Orthopedics;  Laterality: Left;  . BACK SURGERY     lower  . CIRCUMCISION  2010  . FOOT FRACTURE SURGERY Left 1970's   "broke it playing football" (10/12/2012)  . HUMERUS FRACTURE SURGERY W/ IMPLANT Right 1960's   "put a plate in it" (7/35/3299)  . INCISION AND DRAINAGE OF WOUND Left 2006   "foot" (10/12/2012)  . LUMBAR [place] SURGERY  2008   Social History   Occupational History  . Not on file.   Social History Main Topics  . Smoking status: Current Every Day Smoker    Years: 12.00    Types: Pipe  . Smokeless tobacco: Never Used     Comment: 10/12/2012 offered smoking cessation materials; pt declines  . Alcohol use No  . Drug use: No  . Sexual activity: Not Currently

## 2017-01-06 ENCOUNTER — Telehealth (INDEPENDENT_AMBULATORY_CARE_PROVIDER_SITE_OTHER): Payer: Self-pay | Admitting: Family

## 2017-01-06 ENCOUNTER — Other Ambulatory Visit (INDEPENDENT_AMBULATORY_CARE_PROVIDER_SITE_OTHER): Payer: Self-pay | Admitting: Family

## 2017-01-06 MED ORDER — HYDROCODONE-ACETAMINOPHEN 5-325 MG PO TABS: 1.0000 | ORAL_TABLET | Freq: Two times a day (BID) | ORAL | 0 refills | Status: DC | PRN

## 2017-01-06 NOTE — Telephone Encounter (Signed)
Duplicate message, original message sent to [facility] to review.

## 2017-01-06 NOTE — Telephone Encounter (Signed)
Pt is aware and on his way to pick up rx.

## 2017-01-06 NOTE — Telephone Encounter (Signed)
Patient called advised he need something for pain. Patient said he did not sleep last night. The number to contact patient is [phone]

## 2017-01-06 NOTE — Telephone Encounter (Signed)
PT REQUESTED A CALL BACK REGARDING HIS PAIN, STATED HE HAS GOT TO HAVE SOME MEDICINE.  [phone]

## 2017-01-06 NOTE — Telephone Encounter (Signed)
Pt in office last week for ulceration to left transmet amputation. He is requesting rx for pain please advise.

## 2017-01-20 ENCOUNTER — Ambulatory Visit (INDEPENDENT_AMBULATORY_CARE_PROVIDER_SITE_OTHER): Payer: Medicare Other | Admitting: Family

## 2017-01-20 DIAGNOSIS — M65832 Other synovitis and tenosynovitis, left forearm: Secondary | ICD-10-CM

## 2017-01-20 DIAGNOSIS — M65842 Other synovitis and tenosynovitis, left hand: Secondary | ICD-10-CM

## 2017-01-20 MED ORDER — LIDOCAINE HCL 1 % IJ SOLN
1.0000 mL | INTRAMUSCULAR | Status: AC | PRN
  Administered 2017-01-20: 1 mL

## 2017-01-20 MED ORDER — METHYLPREDNISOLONE ACETATE 40 MG/ML IJ SUSP
40.0000 mg | INTRAMUSCULAR | Status: AC | PRN
  Administered 2017-01-20: 40 mg via INTRA_ARTICULAR

## 2017-01-20 NOTE — Progress Notes (Signed)
Office Visit Note   Patient: [name]           Date of Birth: 05-Feb-1941           MRN: [id number] Visit Date: 01/20/2017              Requested by: Lottie Dawson, MD [place]  [place], [place] [address] PCP: Kelton Pillar, Herschell Dimes, MD  No chief complaint on file.     HPI: Patient is a 76 year old gentleman who has had arthritic pain in the left hand and thumb as well as pain along the first dorsal extensor compartment. Patient has had temporary relief with previous injections. Patient request Percocet for his pain.  Assessment & Plan: Visit Diagnoses:  1. Extensor tenosynovitis of wrist, left     Plan: The first dorsal extensor compartment was injected without complications. Recommended a Velcro wrist splint to unload the base of the carpometacarpal joint of the thumb as well as unload stress on the first dorsal extensor compartment.  Follow-Up Instructions: Return if symptoms worsen or fail to improve.   Ortho Exam  Patient is alert, oriented, no adenopathy, well-dressed, normal affect, normal respiratory effort. Examination patient has pain to palpation along the first dorsal extensor compartment A Finkelstein's test is positive. Patient also has pain to palpation over the carpal metacarpal joint of the thumb varus and valgus stress of the MCP joint is not painful no evidence of a ulnar collateral ligament injury.  Imaging: No results found.  Labs: Lab Results  Component Value Date   HGBA1C 8.1 (H) 08/23/2014   HGBA1C 11.3 (H) 10/12/2012   HGBA1C greater than 14 02/23/2008   ESRSEDRATE 76 (H) 09/06/2014   ESRSEDRATE 28 (H) 08/23/2014   ESRSEDRATE 88 (H) 10/12/2012   CRP 15.8 (H) 09/06/2014   CRP 2.3 (H) 08/23/2014   REPTSTATUS 10/25/2013 FINAL 10/24/2013   CULT NO GROWTH Performed at [facility] 10/24/2013    Orders:  No orders of the defined types were placed in this encounter.  No orders of the defined types  were placed in this encounter.    Procedures: Medium Joint Inj Date/Time: 01/20/2017 11:55 AM Performed by: Drea Jurewicz V Authorized by: Newt Minion   Consent Given by:  Patient Site marked: the procedure site was marked   Timeout: prior to procedure the correct patient, procedure, and site was verified   Indications:  Pain and diagnostic evaluation Location:  Wrist Site:  L radiocarpal Prep: patient was prepped and draped in usual sterile fashion   Needle Size:  22 G Needle Length:  1.5 inches Approach:  Dorsal Ultrasound Guided: No   Fluoroscopic Guidance: No   Medications:  1 mL lidocaine 1 %; 40 mg methylPREDNISolone acetate 40 MG/ML Aspiration Attempted: No   Patient tolerance:  Patient tolerated the procedure well with no immediate complications     Clinical Data: No additional findings.  ROS:  All other systems negative, except as noted in the HPI. Review of Systems  Objective: Vital Signs: There were no vitals taken for this visit.  Specialty Comments:  No specialty comments available.  PMFS History: Patient Active Problem List   Diagnosis Date Noted  . Arthritis of carpometacarpal [facility]) joint of left thumb 12/30/2016  . Midfoot ulcer, left, limited to breakdown of skin ([place]) 07/22/2016  . 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[place]) 09/23/2014  . Osteomyelitis of ankle or foot, left, acute ([place]) 08/23/2014  . Cellulitis 10/12/2012  . Chest pain  10/12/2012  . Tobacco abuse 10/12/2012  . ABSCESS, AXILLA, LEFT 02/23/2008  . 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 CHEST WALL PAIN, ACUTE 12/03/2007  . CANDIDIASIS, GLANS PENIS 09/16/2007  . HEADACHE 08/04/2007  . 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 Other and unspecified hyperlipidemia 03/05/2007  . GOUT 03/05/2007  . ERECTILE DYSFUNCTION 03/05/2007  . EXTERNAL HEMORRHOIDS 03/05/2007  . VENTRAL HERNIA 03/05/2007  .  FATTY LIVER DISEASE 03/05/2007  . OSTEOARTHRITIS 03/05/2007  . HEMORRHOIDS, INTERNAL 03/17/2005  . COLONIC POLYPS, HX OF 03/17/2005   Past Medical History:  Diagnosis Date  . Arthritis   . High cholesterol   . Hypertension   . Osteomyelitis (HCC)    Right great toe  . Peripheral vascular disease ([place])    diabetic  with osteomylitis  . Type II diabetes mellitus (HCC)     Family History  Problem Relation Age of Onset  . Diabetes Mellitus II Other   . Anesthesia problems Neg Hx     Past Surgical History:  Procedure Laterality Date  . AMPUTATION  07/23/2011   Procedure: AMPUTATION DIGIT;  Surgeon: Newt Minion, MD;  Location: [place];  Service: Orthopedics;  Laterality: Right;  Right Great Toe Amputation  . AMPUTATION Right 09/18/2012   Procedure: Right Foot 2nd Ray Amputation;  Surgeon: Newt Minion, MD;  Location: [place];  Service: Orthopedics;  Laterality: Right;  Right Foot 2nd Ray Amputation  . AMPUTATION Right 10/14/2012   Procedure: AMPUTATION FOOT;  Surgeon: Newt Minion, MD;  Location: [place];  Service: Orthopedics;  Laterality: Right;  Right Foot Transmetatarsal Amputation  . AMPUTATION Left 09/23/2014   Procedure: Left Foot Transmetatarsal Amputation;  Surgeon: Newt Minion, MD;  Location: [place];  Service: Orthopedics;  Laterality: Left;  . BACK SURGERY     lower  . CIRCUMCISION  2010  . FOOT FRACTURE SURGERY Left 1970's   "broke it playing football" (10/12/2012)  . HUMERUS FRACTURE SURGERY W/ IMPLANT Right 1960's   "put a plate in it" (0/16/0109)  . INCISION AND DRAINAGE OF WOUND Left 2006   "foot" (10/12/2012)  . LUMBAR [place] SURGERY  2008   Social History   Occupational History  . Not on file.   Social History Main Topics  . Smoking status: Current Every Day Smoker    Years: 12.00    Types: Pipe  . Smokeless tobacco: Never Used     Comment: 10/12/2012 offered smoking cessation materials; pt declines  . Alcohol use No  . Drug use: No  . Sexual activity: Not  Currently

## 2017-01-30 ENCOUNTER — Encounter (INDEPENDENT_AMBULATORY_CARE_PROVIDER_SITE_OTHER): Payer: Self-pay | Admitting: Orthopedic Surgery

## 2017-01-30 ENCOUNTER — Ambulatory Visit (INDEPENDENT_AMBULATORY_CARE_PROVIDER_SITE_OTHER): Payer: Medicare Other | Admitting: Orthopedic Surgery

## 2017-01-30 DIAGNOSIS — L97421 Non-pressure chronic ulcer of left heel and midfoot limited to breakdown of skin: Secondary | ICD-10-CM | POA: Diagnosis not present

## 2017-01-30 DIAGNOSIS — Z89432 Acquired absence of left foot: Secondary | ICD-10-CM

## 2017-01-30 DIAGNOSIS — I70209 Unspecified atherosclerosis of native arteries of extremities, unspecified extremity: Secondary | ICD-10-CM

## 2017-01-30 DIAGNOSIS — E1151 Type 2 diabetes mellitus with diabetic peripheral angiopathy without gangrene: Secondary | ICD-10-CM | POA: Diagnosis not present

## 2017-01-30 NOTE — Progress Notes (Signed)
Office Visit Note   Patient: [name]           Date of Birth: [birth year]           MRN: [id number] Visit Date: 01/30/2017              Requested by: Lottie Dawson, MD [place]  [place], [place] [address] PCP: Lottie Dawson, MD  Chief Complaint  Patient presents with  . Left Foot - Wound Check  . Left Hand - Pain      HPI: Patient is a 76 year old gentleman diabetic insensate neuropathy left transmetatarsal amputation with a Wagner grade 1 ulcer plantar aspect the left foot. He has been doing Silvadene dressing changes. He is full weightbearing regular shoes uses a cane. Patient states he still has some pain at the carpometacarpal joint of the left hand and was wondering if he did have another injection his last injection was 4 weeks ago.  Assessment & Plan: Visit Diagnoses:  1. Midfoot ulcer, left, limited to breakdown of skin ([place])   2. Status post transmetatarsal amputation of left foot ([place])   3. 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 Ulcer debridement of skin and soft tissue left foot. Patient will continue wound care. Patient was given information to enroll in the [facility] diabetic foot ulcer study.  Follow-Up Instructions: Return in about 3 weeks (around 02/20/2017).   Ortho Exam  Patient is alert, oriented, no adenopathy, well-dressed, normal affect, normal respiratory effort. Examination patient has an antalgic gait uses a cane. Exam of his left foot he has a callus over the hindfoot and a Wagner grade 1 ulcer over the forefoot. After informed consent a 10 blade knife was used to pair the callus on the heel this callused area measures 2 cm in diameter 3 mm deep there is no ulcers no cellulitis no signs of infection. The Wagner grade 1 ulcer after informed consent was also debrided of skin and soft tissue back to  healthy viable granulation tissue this was touched with silver nitrate a Band-Aid was applied the ulcer measures 30 x 40 mm x 3 mm deep.  Imaging: No results found. No images are attached to the encounter.  Labs: Lab Results  Component Value Date   HGBA1C 8.1 (H) 08/23/2014   HGBA1C 11.3 (H) 10/12/2012   HGBA1C greater than 14 02/23/2008   ESRSEDRATE 76 (H) 09/06/2014   ESRSEDRATE 28 (H) 08/23/2014   ESRSEDRATE 88 (H) 10/12/2012   CRP 15.8 (H) 09/06/2014   CRP 2.3 (H) 08/23/2014   REPTSTATUS 10/25/2013 FINAL 10/24/2013   CULT NO GROWTH Performed at [facility] 10/24/2013    Orders:  No orders of the defined types were placed in this encounter.  No orders of the defined types were placed in this encounter.    Procedures: No procedures performed  Clinical Data: No additional findings.  ROS:  All other systems negative, except as noted in the HPI. Review of Systems  Objective: Vital Signs: There were no vitals taken for this visit.  Specialty Comments:  No specialty comments available.  PMFS History: Patient Active Problem List   Diagnosis Date Noted  . Arthritis of carpometacarpal [facility]) joint of left thumb 12/30/2016  . Midfoot ulcer, left, limited to breakdown of skin ([place]) 07/22/2016  . 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[place]) 09/23/2014  . Osteomyelitis of  ankle or foot, left, acute ([place]) 08/23/2014  . Cellulitis 10/12/2012  . Chest pain 10/12/2012  . Tobacco abuse 10/12/2012  . ABSCESS, AXILLA, LEFT 02/23/2008  . 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 CHEST WALL PAIN, ACUTE 12/03/2007  . CANDIDIASIS, GLANS PENIS 09/16/2007  . HEADACHE 08/04/2007  . 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 Other and unspecified hyperlipidemia 03/05/2007  . GOUT 03/05/2007  . ERECTILE DYSFUNCTION 03/05/2007  . EXTERNAL HEMORRHOIDS 03/05/2007  . VENTRAL HERNIA 03/05/2007  .  FATTY LIVER DISEASE 03/05/2007  . OSTEOARTHRITIS 03/05/2007  . HEMORRHOIDS, INTERNAL 03/17/2005  . COLONIC POLYPS, HX OF 03/17/2005   Past Medical History:  Diagnosis Date  . Arthritis   . High cholesterol   . Hypertension   . Osteomyelitis (HCC)    Right great toe  . Peripheral vascular disease ([place])    diabetic  with osteomylitis  . Type II diabetes mellitus (HCC)     Family History  Problem Relation Age of Onset  . Diabetes Mellitus II Other   . Anesthesia problems Neg Hx     Past Surgical History:  Procedure Laterality Date  . AMPUTATION  07/23/2011   Procedure: AMPUTATION DIGIT;  Surgeon: Newt Minion, MD;  Location: [place];  Service: Orthopedics;  Laterality: Right;  Right Great Toe Amputation  . AMPUTATION Right 09/18/2012   Procedure: Right Foot 2nd Ray Amputation;  Surgeon: Newt Minion, MD;  Location: [place];  Service: Orthopedics;  Laterality: Right;  Right Foot 2nd Ray Amputation  . AMPUTATION Right 10/14/2012   Procedure: AMPUTATION FOOT;  Surgeon: Newt Minion, MD;  Location: [place];  Service: Orthopedics;  Laterality: Right;  Right Foot Transmetatarsal Amputation  . AMPUTATION Left 09/23/2014   Procedure: Left Foot Transmetatarsal Amputation;  Surgeon: Newt Minion, MD;  Location: [place];  Service: Orthopedics;  Laterality: Left;  . BACK SURGERY     lower  . CIRCUMCISION  2010  . FOOT FRACTURE SURGERY Left 1970's   "broke it playing football" (10/12/2012)  . HUMERUS FRACTURE SURGERY W/ IMPLANT Right 1960's   "put a plate in it" (01/17/2121)  . INCISION AND DRAINAGE OF WOUND Left 2006   "foot" (10/12/2012)  . LUMBAR [place] SURGERY  2008   Social History   Occupational History  . Not on file.   Social History Main Topics  . Smoking status: Current Every Day Smoker    Years: 12.00    Types: Pipe  . Smokeless tobacco: Never Used     Comment: 10/12/2012 offered smoking cessation materials; pt declines  . Alcohol use No  . Drug use: No  . Sexual activity: Not  Currently

## 2017-02-10 ENCOUNTER — Other Ambulatory Visit (INDEPENDENT_AMBULATORY_CARE_PROVIDER_SITE_OTHER): Payer: Self-pay | Admitting: Family

## 2017-02-10 ENCOUNTER — Telehealth (INDEPENDENT_AMBULATORY_CARE_PROVIDER_SITE_OTHER): Payer: Self-pay

## 2017-02-10 MED ORDER — NABUMETONE 750 MG PO TABS: 750.0000 mg | ORAL_TABLET | Freq: Two times a day (BID) | ORAL | 3 refills | Status: DC | PRN

## 2017-02-10 NOTE — Telephone Encounter (Signed)
Patient would like a Rx for Percocet.  CB# [phone]. Please advise. Thank You.

## 2017-02-10 NOTE — Telephone Encounter (Signed)
I called and spoke with patient to advise that rx for narcotic declined. Advised narcotics is not appropriate treatment for chronic arthritic pain. He is wanting something for his pain. Do you want to send in meloxicam, or relafen? Uses [facility] pharmacy. Please advise.

## 2017-02-20 ENCOUNTER — Ambulatory Visit (INDEPENDENT_AMBULATORY_CARE_PROVIDER_SITE_OTHER): Payer: Medicare Other | Admitting: Orthopedic Surgery

## 2017-02-20 DIAGNOSIS — M1812 Unilateral primary osteoarthritis of first carpometacarpal joint, left hand: Secondary | ICD-10-CM | POA: Diagnosis not present

## 2017-02-20 DIAGNOSIS — L97421 Non-pressure chronic ulcer of left heel and midfoot limited to breakdown of skin: Secondary | ICD-10-CM | POA: Diagnosis not present

## 2017-02-20 DIAGNOSIS — Z89432 Acquired absence of left foot: Secondary | ICD-10-CM

## 2017-02-20 NOTE — Progress Notes (Signed)
Office Visit Note   Patient: [name]           Date of Birth: 1941/04/23           MRN: [id number] Visit Date: 02/20/2017              Requested by: Lottie Dawson, [address] [place], [place] [id number] PCP: System, Pcp Not In  Chief Complaint  Patient presents with  . Left Foot - Follow-up      HPI: Patient is a 76 year old gentleman diabetic insensate neuropathy left transmetatarsal amputation with a Wagner grade 1 ulcer plantar aspect the left foot. He has been unable to do dressing changes. Cannot reach his feet. Has no one to help him at home. Request [facility] nursing assistance. He is full weightbearing regular shoes uses a cane.  Patient states he still has some pain at the carpometacarpal joint of the left hand and was wondering if he could have another injection his last injection was 4 weeks ago. Request percocet Rx as well.   Assessment & Plan: Visit Diagnoses:  No diagnosis found.  Plan: We'll hold on the carpometacarpal injection at this time. Ulcer debridement of skin and soft tissue left foot. Patient will continue wound care. Will request [facility] nursing for twice weekly dressing changes.   Follow-Up Instructions: No Follow-up on file.   Ortho Exam  Patient is alert, oriented, no adenopathy, well-dressed, normal affect, normal respiratory effort. Examination patient has an antalgic gait uses a cane. Exam of his left foot he has a callus over the hindfoot and a Wagner grade 1 ulcer over the forefoot. After informed consent a 10 blade knife was used to pair the callus on the heel this callused area measures 2 cm in diameter 3 mm deep there is no ulcers no cellulitis no signs of infection. The Wagner grade 1 ulcer of forefoot, after informed consent was also debrided of skin and soft tissue back to healthy viable granulation tissue this was touched with silver nitrate a Band-Aid was applied the ulcer measures 10 mm x 6 mm and 4 mm  deep.  Imaging: No results found. No images are attached to the encounter.  Labs: Lab Results  Component Value Date   HGBA1C 8.1 (H) 08/23/2014   HGBA1C 11.3 (H) 10/12/2012   HGBA1C greater than 14 02/23/2008   ESRSEDRATE 76 (H) 09/06/2014   ESRSEDRATE 28 (H) 08/23/2014   ESRSEDRATE 88 (H) 10/12/2012   CRP 15.8 (H) 09/06/2014   CRP 2.3 (H) 08/23/2014   REPTSTATUS 10/25/2013 FINAL 10/24/2013   CULT NO GROWTH Performed at [facility] 10/24/2013    Orders:  No orders of the defined types were placed in this encounter.  No orders of the defined types were placed in this encounter.    Procedures: No procedures performed  Clinical Data: No additional findings.  ROS:  All other systems negative, except as noted in the HPI. Review of Systems  Constitutional: Negative for chills and fever.  Cardiovascular: Positive for leg swelling.  Skin: Positive for wound. Negative for color change.    Objective: Vital Signs: There were no vitals taken for this visit.  Specialty Comments:  No specialty comments available.  PMFS History: Patient Active Problem List   Diagnosis Date Noted  . Arthritis of carpometacarpal [facility]) joint of left thumb 12/30/2016  . Midfoot ulcer, left, limited to breakdown of skin ([place]) 07/22/2016  . 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[place]) 09/23/2014  .  Osteomyelitis of ankle or foot, left, acute ([place]) 08/23/2014  . Cellulitis 10/12/2012  . Chest pain 10/12/2012  . Tobacco abuse 10/12/2012  . ABSCESS, AXILLA, LEFT 02/23/2008  . 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 CHEST WALL PAIN, ACUTE 12/03/2007  . CANDIDIASIS, GLANS PENIS 09/16/2007  . HEADACHE 08/04/2007  . 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 Other and unspecified hyperlipidemia 03/05/2007  . GOUT 03/05/2007  . ERECTILE DYSFUNCTION 03/05/2007  . EXTERNAL HEMORRHOIDS 03/05/2007  . VENTRAL  HERNIA 03/05/2007  . FATTY LIVER DISEASE 03/05/2007  . OSTEOARTHRITIS 03/05/2007  . HEMORRHOIDS, INTERNAL 03/17/2005  . COLONIC POLYPS, HX OF 03/17/2005   Past Medical History:  Diagnosis Date  . Arthritis   . High cholesterol   . Hypertension   . Osteomyelitis (HCC)    Right great toe  . Peripheral vascular disease ([place])    diabetic  with osteomylitis  . Type II diabetes mellitus (HCC)     Family History  Problem Relation Age of Onset  . Diabetes Mellitus II Other   . Anesthesia problems Neg Hx     Past Surgical History:  Procedure Laterality Date  . AMPUTATION  07/23/2011   Procedure: AMPUTATION DIGIT;  Surgeon: Newt Minion, MD;  Location: [place];  Service: Orthopedics;  Laterality: Right;  Right Great Toe Amputation  . AMPUTATION Right 09/18/2012   Procedure: Right Foot 2nd Ray Amputation;  Surgeon: Newt Minion, MD;  Location: [place];  Service: Orthopedics;  Laterality: Right;  Right Foot 2nd Ray Amputation  . AMPUTATION Right 10/14/2012   Procedure: AMPUTATION FOOT;  Surgeon: Newt Minion, MD;  Location: [place];  Service: Orthopedics;  Laterality: Right;  Right Foot Transmetatarsal Amputation  . AMPUTATION Left 09/23/2014   Procedure: Left Foot Transmetatarsal Amputation;  Surgeon: Newt Minion, MD;  Location: [place];  Service: Orthopedics;  Laterality: Left;  . BACK SURGERY     lower  . CIRCUMCISION  2010  . FOOT FRACTURE SURGERY Left 1970's   "broke it playing football" (10/12/2012)  . HUMERUS FRACTURE SURGERY W/ IMPLANT Right 1960's   "put a plate in it" (2/44/0102)  . INCISION AND DRAINAGE OF WOUND Left 2006   "foot" (10/12/2012)  . LUMBAR [place] SURGERY  2008   Social History   Occupational History  . Not on file.   Social History Main Topics  . Smoking status: Current Every Day Smoker    Years: 12.00    Types: Pipe  . Smokeless tobacco: Never Used     Comment: 10/12/2012 offered smoking cessation materials; pt declines  . Alcohol use No  . Drug use: No  .  Sexual activity: Not Currently

## 2017-02-21 ENCOUNTER — Telehealth (INDEPENDENT_AMBULATORY_CARE_PROVIDER_SITE_OTHER): Payer: Self-pay | Admitting: Radiology

## 2017-02-21 DIAGNOSIS — L97421 Non-pressure chronic ulcer of left heel and midfoot limited to breakdown of skin: Secondary | ICD-10-CM

## 2017-02-21 NOTE — Telephone Encounter (Signed)
-----   Message from Suzan Slick, NP sent at 02/20/2017  9:25 AM EDT ----- [facility] nursing referral for dressing changes  Silvadene to forefoot ulcer 2x weekly  Thinks has used [facility] in past

## 2017-03-20 ENCOUNTER — Ambulatory Visit (INDEPENDENT_AMBULATORY_CARE_PROVIDER_SITE_OTHER): Payer: Medicare Other | Admitting: Orthopedic Surgery

## 2017-03-20 ENCOUNTER — Encounter (INDEPENDENT_AMBULATORY_CARE_PROVIDER_SITE_OTHER): Payer: Self-pay | Admitting: Orthopedic Surgery

## 2017-03-20 DIAGNOSIS — L97421 Non-pressure chronic ulcer of left heel and midfoot limited to breakdown of skin: Secondary | ICD-10-CM

## 2017-03-20 DIAGNOSIS — Z89432 Acquired absence of left foot: Secondary | ICD-10-CM

## 2017-03-20 NOTE — Progress Notes (Signed)
Office Visit Note   Patient: [name]           Date of Birth: 09-01-1940           MRN: [id number] Visit Date: 03/20/2017              Requested by: No referring provider defined for this encounter. PCP: System, Pcp Not In  Chief Complaint  Patient presents with  . Left Foot - Follow-up      HPI: Patient presents in follow-up for plantar ulcers left foot status post transmetatarsal amputation 2 years ago.  Assessment & Plan: Visit Diagnoses:  1. Midfoot ulceration, left, limited to breakdown of skin ([place])   2. Status post transmetatarsal amputation of left foot ([place])     Plan: Ulcer debridement of skin and soft tissue 2 recommended protected weightbearing continue with dressing changes he has home health coming out changing the dressing 3 times a week.  Follow-Up Instructions: Return in about 4 weeks (around 04/17/2017).   Ortho Exam  Patient is alert, oriented, no adenopathy, well-dressed, normal affect, normal respiratory effort. Examination patient has an antalgic gait. He has no venous stasis swelling or venous stasis ulcers. His foot is plantigrade he has to [employer] grade 1 ulcers in the plantar aspect the left transmetatarsal amputation. The previous surgical incision is well-healed. After informed consent a 10 blade knife was used to debride the skin and soft tissue for 2 ulcers on the plantar aspect the left foot 1 on the hindfoot 1 on the midfoot. The midfoot ulcer is 20 x 30 mm and 3 mm deep the heel ulcer is 2 cm in diameter and 3 mm deep. There is good bleeding granulation tissue on the forefoot ulcer this was touched with silver nitrate and a Band-Aid was applied.  Imaging: No results found. No images are attached to the encounter.  Labs: Lab Results  Component Value Date   HGBA1C 8.1 (H) 08/23/2014   HGBA1C 11.3 (H) 10/12/2012   HGBA1C greater than 14 02/23/2008   ESRSEDRATE 76 (H) 09/06/2014   ESRSEDRATE 28 (H) 08/23/2014   ESRSEDRATE 88 (H)  10/12/2012   CRP 15.8 (H) 09/06/2014   CRP 2.3 (H) 08/23/2014   REPTSTATUS 10/25/2013 FINAL 10/24/2013   CULT NO GROWTH Performed at [facility] 10/24/2013    Orders:  No orders of the defined types were placed in this encounter.  No orders of the defined types were placed in this encounter.    Procedures: No procedures performed  Clinical Data: No additional findings.  ROS:  All other systems negative, except as noted in the HPI. Review of Systems  Objective: Vital Signs: There were no vitals taken for this visit.  Specialty Comments:  No specialty comments available.  PMFS History: Patient Active Problem List   Diagnosis Date Noted  . Arthritis of carpometacarpal [facility]) joint of left thumb 12/30/2016  . Midfoot ulceration, left, limited to breakdown of skin ([place]) 07/22/2016  . 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[place]) 09/23/2014  . Osteomyelitis of ankle or foot, left, acute ([place]) 08/23/2014  . Cellulitis 10/12/2012  . Chest pain 10/12/2012  . Tobacco abuse 10/12/2012  . ABSCESS, AXILLA, LEFT 02/23/2008  . 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 CHEST WALL PAIN, ACUTE 12/03/2007  . CANDIDIASIS, GLANS PENIS 09/16/2007  . HEADACHE 08/04/2007  . 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 Other and unspecified hyperlipidemia 03/05/2007  . GOUT 03/05/2007  .  ERECTILE DYSFUNCTION 03/05/2007  . EXTERNAL HEMORRHOIDS 03/05/2007  . VENTRAL HERNIA 03/05/2007  . FATTY LIVER DISEASE 03/05/2007  . OSTEOARTHRITIS 03/05/2007  . HEMORRHOIDS, INTERNAL 03/17/2005  . COLONIC POLYPS, HX OF 03/17/2005   Past Medical History:  Diagnosis Date  . Arthritis   . High cholesterol   . Hypertension   . Osteomyelitis (HCC)    Right great toe  . Peripheral vascular disease ([place])    diabetic  with osteomylitis  . Type II diabetes mellitus (HCC)     Family History  Problem Relation Age of  Onset  . Diabetes Mellitus II Other   . Anesthesia problems Neg Hx     Past Surgical History:  Procedure Laterality Date  . AMPUTATION  07/23/2011   Procedure: AMPUTATION DIGIT;  Surgeon: Newt Minion, MD;  Location: [place];  Service: Orthopedics;  Laterality: Right;  Right Great Toe Amputation  . AMPUTATION Right 09/18/2012   Procedure: Right Foot 2nd Ray Amputation;  Surgeon: Newt Minion, MD;  Location: [place];  Service: Orthopedics;  Laterality: Right;  Right Foot 2nd Ray Amputation  . AMPUTATION Right 10/14/2012   Procedure: AMPUTATION FOOT;  Surgeon: Newt Minion, MD;  Location: [place];  Service: Orthopedics;  Laterality: Right;  Right Foot Transmetatarsal Amputation  . AMPUTATION Left 09/23/2014   Procedure: Left Foot Transmetatarsal Amputation;  Surgeon: Newt Minion, MD;  Location: [place];  Service: Orthopedics;  Laterality: Left;  . BACK SURGERY     lower  . CIRCUMCISION  2010  . FOOT FRACTURE SURGERY Left 1970's   "broke it playing football" (10/12/2012)  . HUMERUS FRACTURE SURGERY W/ IMPLANT Right 1960's   "put a plate in it" (5/95/6387)  . INCISION AND DRAINAGE OF WOUND Left 2006   "foot" (10/12/2012)  . LUMBAR [place] SURGERY  2008   Social History   Occupational History  . Not on file.   Social History Main Topics  . Smoking status: Current Every Day Smoker    Years: 12.00    Types: Pipe  . Smokeless tobacco: Never Used     Comment: 10/12/2012 offered smoking cessation materials; pt declines  . Alcohol use No  . Drug use: No  . Sexual activity: Not Currently

## 2017-04-03 ENCOUNTER — Telehealth (INDEPENDENT_AMBULATORY_CARE_PROVIDER_SITE_OTHER): Payer: Self-pay | Admitting: Orthopedic Surgery

## 2017-04-03 NOTE — Telephone Encounter (Signed)
[facility] at home  Nurse wanted to continue working with pt Phone number [phone]   Verbal orders  Twice a week for two weeks  Once a week for two weeks

## 2017-04-04 NOTE — Telephone Encounter (Signed)
I called and left voicemail to give verbal order for continuation of care.

## 2017-04-08 ENCOUNTER — Telehealth (INDEPENDENT_AMBULATORY_CARE_PROVIDER_SITE_OTHER): Payer: Self-pay | Admitting: Family

## 2017-04-08 NOTE — Telephone Encounter (Signed)
Patient states the medication Nabumetone 750 mg which was given to him for his hands is not working. He states he was up all night from the pain and request something else.

## 2017-04-08 NOTE — Telephone Encounter (Signed)
He can try using paraffin baths for his hands.  We need to reevaluate this at follow-up in the office.

## 2017-04-09 NOTE — Telephone Encounter (Signed)
Patient is coming in the office tomorrow for repeat eval with Dr. Sharol Given at 915am. Appt cancelled for 04/17/17

## 2017-04-10 ENCOUNTER — Ambulatory Visit (INDEPENDENT_AMBULATORY_CARE_PROVIDER_SITE_OTHER): Payer: Medicare Other | Admitting: Orthopedic Surgery

## 2017-04-17 ENCOUNTER — Encounter (INDEPENDENT_AMBULATORY_CARE_PROVIDER_SITE_OTHER): Payer: Self-pay

## 2017-04-17 ENCOUNTER — Ambulatory Visit (INDEPENDENT_AMBULATORY_CARE_PROVIDER_SITE_OTHER): Payer: Medicare Other | Admitting: Orthopedic Surgery

## 2017-04-17 ENCOUNTER — Encounter (INDEPENDENT_AMBULATORY_CARE_PROVIDER_SITE_OTHER): Payer: Self-pay | Admitting: Orthopedic Surgery

## 2017-04-17 DIAGNOSIS — L97421 Non-pressure chronic ulcer of left heel and midfoot limited to breakdown of skin: Secondary | ICD-10-CM

## 2017-04-17 DIAGNOSIS — M79641 Pain in right hand: Secondary | ICD-10-CM | POA: Diagnosis not present

## 2017-04-17 NOTE — Progress Notes (Signed)
Office Visit Note   Patient: [name]           Date of Birth: [birth year]           MRN: [id number] Visit Date: 04/17/2017              Requested by: No referring provider defined for this encounter. PCP: System, Pcp Not In  Chief Complaint  Patient presents with  . Right Hand - Pain  . Left Hand - Follow-up      HPI: Patient is a 76 year old gentleman who presents follow-up for right and a grade 1 ulcers x2 plantar aspect left foot status post transmetatarsal amputation.  Patient also states he has been having pain in the left hand and he states that now this pain is in the right hand.  Complains of pain across the PIP and DIP joints.  No pain across the MCP joints.  Assessment & Plan: Visit Diagnoses:  1. Midfoot ulceration, left, limited to breakdown of skin (HCC)   2. Pain in right hand     Plan: Recommended paraffin heat baths for both hands.  This was written on a prescription for him.  The ulcers were debrided x2 left foot.  Do not feel patient needs any further wound care at home.  Follow-Up Instructions: Return in about 4 weeks (around 05/15/2017).   Ortho Exam  Patient is alert, oriented, no adenopathy, well-dressed, normal affect, normal respiratory effort. Examination of both hands there is no swelling around the PIP or DIP joints.  The hands are neurovascular intact with good strength.  No symptoms of carpal tunnel syndrome.  Examination of his left foot he has 2 Wegener grade 1 ulcers with large amount of callus around the ulcers.  After informed consent a 10 blade knife was used to debride the skin and soft tissue back to bleeding viable granulation tissue this was touched with silver nitrate.  The forefoot ulcer is 2 cm in diameter 3 mm deep and the heel ulcer is 3 cm in diameter and 3 mm deep.  There is no signs of infection there is no exposed bone or tendon or fascia.  Imaging: No results found. No images are attached to the encounter.  Labs: Lab  Results  Component Value Date   HGBA1C 8.1 (H) 08/23/2014   HGBA1C 11.3 (H) 10/12/2012   HGBA1C greater than 14 02/23/2008   ESRSEDRATE 76 (H) 09/06/2014   ESRSEDRATE 28 (H) 08/23/2014   ESRSEDRATE 88 (H) 10/12/2012   CRP 15.8 (H) 09/06/2014   CRP 2.3 (H) 08/23/2014   REPTSTATUS 10/25/2013 FINAL 10/24/2013   CULT NO GROWTH Performed at [facility] 10/24/2013    Orders:  No orders of the defined types were placed in this encounter.  No orders of the defined types were placed in this encounter.    Procedures: No procedures performed  Clinical Data: No additional findings.  ROS:  All other systems negative, except as noted in the HPI. Review of Systems  Objective: Vital Signs: There were no vitals taken for this visit.  Specialty Comments:  No specialty comments available.  PMFS History: Patient Active Problem List   Diagnosis Date Noted  . 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 Midfoot ulceration, left, limited to breakdown of skin ([place]) 07/22/2016  . 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[place]) 09/23/2014  . Osteomyelitis of ankle or foot, left, acute ([place]) 08/23/2014  . Cellulitis 10/12/2012  . Chest pain 10/12/2012  . Tobacco  abuse 10/12/2012  . ABSCESS, AXILLA, LEFT 02/23/2008  . 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 CHEST WALL PAIN, ACUTE 12/03/2007  . CANDIDIASIS, GLANS PENIS 09/16/2007  . HEADACHE 08/04/2007  . 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 Other and unspecified hyperlipidemia 03/05/2007  . GOUT 03/05/2007  . ERECTILE DYSFUNCTION 03/05/2007  . EXTERNAL HEMORRHOIDS 03/05/2007  . VENTRAL HERNIA 03/05/2007  . FATTY LIVER DISEASE 03/05/2007  . OSTEOARTHRITIS 03/05/2007  . HEMORRHOIDS, INTERNAL 03/17/2005  . COLONIC POLYPS, HX OF 03/17/2005   Past Medical History:  Diagnosis Date  . Arthritis   . High cholesterol   .  Hypertension   . Osteomyelitis (HCC)    Right great toe  . Peripheral vascular disease ([place])    diabetic  with osteomylitis  . Type II diabetes mellitus (HCC)     Family History  Problem Relation Age of Onset  . Diabetes Mellitus II Other   . Anesthesia problems Neg Hx     Past Surgical History:  Procedure Laterality Date  . AMPUTATION  07/23/2011   Procedure: AMPUTATION DIGIT;  Surgeon: Newt Minion, MD;  Location: [place];  Service: Orthopedics;  Laterality: Right;  Right Great Toe Amputation  . AMPUTATION Right 09/18/2012   Procedure: Right Foot 2nd Ray Amputation;  Surgeon: Newt Minion, MD;  Location: [place];  Service: Orthopedics;  Laterality: Right;  Right Foot 2nd Ray Amputation  . AMPUTATION Right 10/14/2012   Procedure: AMPUTATION FOOT;  Surgeon: Newt Minion, MD;  Location: [place];  Service: Orthopedics;  Laterality: Right;  Right Foot Transmetatarsal Amputation  . AMPUTATION Left 09/23/2014   Procedure: Left Foot Transmetatarsal Amputation;  Surgeon: Newt Minion, MD;  Location: [place];  Service: Orthopedics;  Laterality: Left;  . BACK SURGERY     lower  . CIRCUMCISION  2010  . FOOT FRACTURE SURGERY Left 1970's   "broke it playing football" (10/12/2012)  . HUMERUS FRACTURE SURGERY W/ IMPLANT Right 1960's   "put a plate in it" (0/03/2724)  . INCISION AND DRAINAGE OF WOUND Left 2006   "foot" (10/12/2012)  . LUMBAR [place] SURGERY  2008   Social History   Occupational History  . Not on file.   Social History Main Topics  . Smoking status: Current Every Day Smoker    Years: 12.00    Types: Pipe  . Smokeless tobacco: Never Used     Comment: 10/12/2012 offered smoking cessation materials; pt declines  . Alcohol use No  . Drug use: No  . Sexual activity: Not Currently

## 2017-05-01 ENCOUNTER — Telehealth (INDEPENDENT_AMBULATORY_CARE_PROVIDER_SITE_OTHER): Payer: Self-pay | Admitting: Family

## 2017-05-01 ENCOUNTER — Other Ambulatory Visit (INDEPENDENT_AMBULATORY_CARE_PROVIDER_SITE_OTHER): Payer: Self-pay | Admitting: Orthopedic Surgery

## 2017-05-01 MED ORDER — CELECOXIB 200 MG PO CAPS: 200.0000 mg | ORAL_CAPSULE | Freq: Two times a day (BID) | ORAL | 3 refills | Status: DC

## 2017-05-01 NOTE — Telephone Encounter (Signed)
Patient requests a different medication than the Nabumetone?[place] Kitchen His hand is still hurting/burning even while he is on this medication. Call pt to advise if there is anything else he can take. -[phone]

## 2017-05-01 NOTE — Telephone Encounter (Signed)
rx sent to [facility] [address]

## 2017-05-01 NOTE — Telephone Encounter (Signed)
I left voicemail for the patient advised rx sent in for celebrex to [facility] [address].

## 2017-05-02 ENCOUNTER — Telehealth (INDEPENDENT_AMBULATORY_CARE_PROVIDER_SITE_OTHER): Payer: Self-pay | Admitting: Radiology

## 2017-05-02 NOTE — Telephone Encounter (Signed)
Patient called again wanting to change rx to [facility] for celebrex. This was called in.

## 2017-05-02 NOTE — Telephone Encounter (Signed)
Patient calling back advised [facility] [address] did not receive rx for celebrex. Called in again to pharmacy and left on their pharmacy voicemail.

## 2017-05-15 ENCOUNTER — Ambulatory Visit (INDEPENDENT_AMBULATORY_CARE_PROVIDER_SITE_OTHER): Payer: Medicare Other | Admitting: Family

## 2017-05-15 ENCOUNTER — Encounter (INDEPENDENT_AMBULATORY_CARE_PROVIDER_SITE_OTHER): Payer: Self-pay | Admitting: Orthopedic Surgery

## 2017-05-15 VITALS — Ht 73.0 in | Wt 212.0 lb

## 2017-05-15 DIAGNOSIS — M1812 Unilateral primary osteoarthritis of first carpometacarpal joint, left hand: Secondary | ICD-10-CM | POA: Diagnosis not present

## 2017-05-15 DIAGNOSIS — M19042 Primary osteoarthritis, left hand: Secondary | ICD-10-CM | POA: Diagnosis not present

## 2017-05-15 DIAGNOSIS — L97421 Non-pressure chronic ulcer of left heel and midfoot limited to breakdown of skin: Secondary | ICD-10-CM | POA: Diagnosis not present

## 2017-05-15 DIAGNOSIS — M19041 Primary osteoarthritis, right hand: Secondary | ICD-10-CM

## 2017-05-16 ENCOUNTER — Encounter (INDEPENDENT_AMBULATORY_CARE_PROVIDER_SITE_OTHER): Payer: Self-pay | Admitting: Family

## 2017-05-16 MED ORDER — CAPSAICIN-MENTHOL-METHYL SAL 0.025-1-12 % EX CREA
1.0000 | TOPICAL_CREAM | Freq: Four times a day (QID) | CUTANEOUS | 2 refills | Status: DC | PRN
Start: 2017-05-16 — End: 2018-01-01

## 2017-05-16 NOTE — Progress Notes (Signed)
Office Visit Note   Patient: [name]           Date of Birth: 03/09/1941           MRN: [id number] Visit Date: 05/15/2017              Requested by: No referring provider defined for this encounter. PCP: System, Pcp Not In  Chief Complaint  Patient presents with  . Left Foot - Follow-up    Left foot transmet amputation   . Right Hand - Pain      HPI: Patient is a 76 year old gentleman who presents follow-up for right and a grade 1 ulcers x2 plantar aspect left foot status post transmetatarsal amputation.  Patient also states he has been having bilateral hand pain.  Complains of pain across the PIP and DIP joints.  No pain across the MCP joints.  Assessment & Plan: Visit Diagnoses:  No diagnosis found.  Plan: Recommended paraffin heat baths for both hands. Offered Capsaicin. This was written on a prescription for him.  The ulcers were debrided x2 left foot.  Continue oral antiinflammatories.  Follow-Up Instructions: No Follow-up on file.   Ortho Exam  Patient is alert, oriented, no adenopathy, well-dressed, normal affect, normal respiratory effort. Examination of both hands there is no swelling around the PIP or DIP joints.  The hands are neurovascular intact with good strength.  No symptoms of carpal tunnel syndrome.  Examination of his left foot he has 2 Wagner grade 1 ulcers with large amount of callus around the ulcers.  After informed consent a 10 blade knife was used to debride the skin and soft tissue back to bleeding viable granulation tissue this was touched with silver nitrate.  The forefoot ulcer is 3 cm in diameter 3 mm deep and the heel ulcer is 2 cm in diameter and 3 mm deep.  There is no signs of infection there is no exposed bone or tendon or fascia.  Imaging: No results found. No images are attached to the encounter.  Labs: Lab Results  Component Value Date   HGBA1C 8.1 (H) 08/23/2014   HGBA1C 11.3 (H) 10/12/2012   HGBA1C greater than 14 02/23/2008     ESRSEDRATE 76 (H) 09/06/2014   ESRSEDRATE 28 (H) 08/23/2014   ESRSEDRATE 88 (H) 10/12/2012   CRP 15.8 (H) 09/06/2014   CRP 2.3 (H) 08/23/2014   REPTSTATUS 10/25/2013 FINAL 10/24/2013   CULT NO GROWTH Performed at [facility] 10/24/2013    Orders:  No orders of the defined types were placed in this encounter.  No orders of the defined types were placed in this encounter.    Procedures: No procedures performed  Clinical Data: No additional findings.  ROS:  All other systems negative, except as noted in the HPI. Review of Systems  Constitutional: Negative for chills and fever.  Musculoskeletal: Positive for arthralgias.  Skin: Positive for wound. Negative for color change.  Neurological: Negative for weakness and numbness.    Objective: Vital Signs: Ht 6\' 1"  (1.854 m)   Wt 212 lb (96.2 kg)   BMI 27.97 kg/m   Specialty Comments:  No specialty comments available.  PMFS History: Patient Active Problem List   Diagnosis Date Noted  . 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 Midfoot ulceration, left, limited to breakdown of skin ([place]) 07/22/2016  . 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[place]) 09/23/2014  . Osteomyelitis of ankle or foot, left, acute ([place]) 08/23/2014  . Cellulitis 10/12/2012  .  Chest pain 10/12/2012  . Tobacco abuse 10/12/2012  . ABSCESS, AXILLA, LEFT 02/23/2008  . 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 CHEST WALL PAIN, ACUTE 12/03/2007  . CANDIDIASIS, GLANS PENIS 09/16/2007  . HEADACHE 08/04/2007  . 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 Other and unspecified hyperlipidemia 03/05/2007  . GOUT 03/05/2007  . ERECTILE DYSFUNCTION 03/05/2007  . EXTERNAL HEMORRHOIDS 03/05/2007  . VENTRAL HERNIA 03/05/2007  . FATTY LIVER DISEASE 03/05/2007  . OSTEOARTHRITIS 03/05/2007  . HEMORRHOIDS, INTERNAL 03/17/2005  . COLONIC POLYPS, HX OF  03/17/2005   Past Medical History:  Diagnosis Date  . Arthritis   . High cholesterol   . Hypertension   . Osteomyelitis (HCC)    Right great toe  . Peripheral vascular disease ([place])    diabetic  with osteomylitis  . Type II diabetes mellitus (HCC)     Family History  Problem Relation Age of Onset  . Diabetes Mellitus II Other   . Anesthesia problems Neg Hx     Past Surgical History:  Procedure Laterality Date  . AMPUTATION  07/23/2011   Procedure: AMPUTATION DIGIT;  Surgeon: Newt Minion, MD;  Location: [place];  Service: Orthopedics;  Laterality: Right;  Right Great Toe Amputation  . AMPUTATION Right 09/18/2012   Procedure: Right Foot 2nd Ray Amputation;  Surgeon: Newt Minion, MD;  Location: [place];  Service: Orthopedics;  Laterality: Right;  Right Foot 2nd Ray Amputation  . AMPUTATION Right 10/14/2012   Procedure: AMPUTATION FOOT;  Surgeon: Newt Minion, MD;  Location: [place];  Service: Orthopedics;  Laterality: Right;  Right Foot Transmetatarsal Amputation  . AMPUTATION Left 09/23/2014   Procedure: Left Foot Transmetatarsal Amputation;  Surgeon: Newt Minion, MD;  Location: [place];  Service: Orthopedics;  Laterality: Left;  . BACK SURGERY     lower  . CIRCUMCISION  2010  . FOOT FRACTURE SURGERY Left 1970's   "broke it playing football" (10/12/2012)  . HUMERUS FRACTURE SURGERY W/ IMPLANT Right 1960's   "put a plate in it" (4/66/5993)  . INCISION AND DRAINAGE OF WOUND Left 2006   "foot" (10/12/2012)  . LUMBAR [place] SURGERY  2008   Social History   Occupational History  . Not on file  Tobacco Use  . Smoking status: Current Every Day Smoker    Years: 12.00    Types: Pipe  . Smokeless tobacco: Never Used  . Tobacco comment: 10/12/2012 offered smoking cessation materials; pt declines  Substance and Sexual Activity  . Alcohol use: No    Alcohol/week: 0.0 oz  . Drug use: No  . Sexual activity: Not Currently

## 2017-05-19 ENCOUNTER — Encounter (HOSPITAL_COMMUNITY): Payer: Self-pay | Admitting: Emergency Medicine

## 2017-05-19 ENCOUNTER — Other Ambulatory Visit: Payer: Self-pay

## 2017-05-19 ENCOUNTER — Emergency Department (HOSPITAL_COMMUNITY)
Admission: EM | Admit: 2017-05-19 | Discharge: 2017-05-19 | Disposition: A | Discharge status: Home / Self Care | Payer: Medicare Other | Attending: Emergency Medicine | Admitting: Emergency Medicine

## 2017-05-19 ENCOUNTER — Emergency Department (HOSPITAL_COMMUNITY): Payer: Medicare Other

## 2017-05-19 DIAGNOSIS — R202 Paresthesia of skin: Secondary | ICD-10-CM | POA: Diagnosis present

## 2017-05-19 DIAGNOSIS — I10 Essential (primary) hypertension: Secondary | ICD-10-CM | POA: Diagnosis not present

## 2017-05-19 DIAGNOSIS — M19049 Primary osteoarthritis, unspecified hand: Secondary | ICD-10-CM

## 2017-05-19 DIAGNOSIS — F17210 Nicotine dependence, cigarettes, uncomplicated: Secondary | ICD-10-CM | POA: Insufficient documentation

## 2017-05-19 DIAGNOSIS — E119 Type 2 diabetes mellitus without complications: Secondary | ICD-10-CM | POA: Diagnosis not present

## 2017-05-19 DIAGNOSIS — M5412 Radiculopathy, cervical region: Secondary | ICD-10-CM | POA: Diagnosis not present

## 2017-05-19 DIAGNOSIS — M19041 Primary osteoarthritis, right hand: Secondary | ICD-10-CM | POA: Insufficient documentation

## 2017-05-19 DIAGNOSIS — Z7984 Long term (current) use of oral hypoglycemic drugs: Secondary | ICD-10-CM | POA: Insufficient documentation

## 2017-05-19 LAB — CBG MONITORING, ED
Glucose-Capillary: 87 mg/dL (ref 65–99)
Glucose-Capillary: 92 mg/dL (ref 65–99)

## 2017-05-19 LAB — CBC
HCT: 35.2 % — ABNORMAL LOW (ref 39.0–52.0)
Hemoglobin: 11.5 g/dL — ABNORMAL LOW (ref 13.0–17.0)
MCH: 28.2 pg (ref 26.0–34.0)
MCHC: 32.7 g/dL (ref 30.0–36.0)
MCV: 86.3 fL (ref 78.0–100.0)
Platelets: 203 10*3/uL (ref 150–400)
RBC: 4.08 MIL/uL — ABNORMAL LOW (ref 4.22–5.81)
RDW: 16.5 % — ABNORMAL HIGH (ref 11.5–15.5)
WBC: 6.3 10*3/uL (ref 4.0–10.5)

## 2017-05-19 LAB — BASIC METABOLIC PANEL
Anion gap: 6 (ref 5–15)
BUN: 14 mg/dL (ref 6–20)
CO2: 24 mmol/L (ref 22–32)
Calcium: 8.9 mg/dL (ref 8.9–10.3)
Chloride: 107 mmol/L (ref 101–111)
Creatinine, Ser: 1.12 mg/dL (ref 0.61–1.24)
GFR calc Af Amer: >60 mL/min (ref >60)
GFR calc non Af Amer: >60 mL/min (ref >60)
Glucose, Bld: 98 mg/dL (ref 65–99)
Potassium: 4.7 mmol/L (ref 3.5–5.1)
Sodium: 137 mmol/L (ref 135–145)

## 2017-05-19 MED ORDER — GABAPENTIN 300 MG PO CAPS: ORAL_CAPSULE | ORAL | 0 refills | Status: DC

## 2017-05-19 NOTE — ED Provider Notes (Signed)
[facility] EMERGENCY DEPARTMENT Provider Note   CSN: [id number] Arrival date & time: 05/19/17  5784     History   Chief Complaint No chief complaint on file.   HPI Jonty Morrical is a 76 y.o. male.  HPI Presents to the emergency room for evaluation of pain in the fingers of his right hand.  Patient states he has had this difficulty for the last several months.  At times he feels like his arm is numb.  At times he also feels like the pain moves up towards his neck and down his arm.  At night seems to be the worst.  The pain is most severe in the fingers of his right hand.  He has not noticed any swelling.  He denies any injuries.  No fevers or chills.  he has not seen anyone for this recently. Past Medical History:  Diagnosis Date  . Arthritis   . High cholesterol   . Hypertension   . Osteomyelitis (HCC)    Right great toe  . Peripheral vascular disease ([place])    diabetic  with osteomylitis  . Type II diabetes mellitus [facility])     Patient Active Problem List   Diagnosis Date Noted  . Arthritis of carpometacarpal [facility]) joint of left thumb 12/30/2016  . Midfoot ulceration, left, limited to breakdown of skin ([place]) 07/22/2016  . 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 Osteomyelitis of ankle or foot, left, acute ([place]) 08/23/2014  . Cellulitis 10/12/2012  . Chest pain 10/12/2012  . Tobacco abuse 10/12/2012  . ABSCESS, AXILLA, LEFT 02/23/2008  . 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 CHEST WALL PAIN, ACUTE 12/03/2007  . CANDIDIASIS, GLANS PENIS 09/16/2007  . HEADACHE 08/04/2007  . 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 Other and unspecified hyperlipidemia 03/05/2007  . GOUT 03/05/2007  . ERECTILE DYSFUNCTION 03/05/2007  . EXTERNAL HEMORRHOIDS 03/05/2007  . VENTRAL HERNIA 03/05/2007  . FATTY LIVER DISEASE 03/05/2007  . Osteoarthritis 03/05/2007  . HEMORRHOIDS,  INTERNAL 03/17/2005  . COLONIC POLYPS, HX OF 03/17/2005    Past Surgical History:  Procedure Laterality Date  . AMPUTATION  07/23/2011   Procedure: AMPUTATION DIGIT;  Surgeon: Newt Minion, MD;  Location: [place];  Service: Orthopedics;  Laterality: Right;  Right Great Toe Amputation  . AMPUTATION Right 09/18/2012   Procedure: Right Foot 2nd Ray Amputation;  Surgeon: Newt Minion, MD;  Location: [place];  Service: Orthopedics;  Laterality: Right;  Right Foot 2nd Ray Amputation  . AMPUTATION Right 10/14/2012   Procedure: AMPUTATION FOOT;  Surgeon: Newt Minion, MD;  Location: [place];  Service: Orthopedics;  Laterality: Right;  Right Foot Transmetatarsal Amputation  . AMPUTATION Left 09/23/2014   Procedure: Left Foot Transmetatarsal Amputation;  Surgeon: Newt Minion, MD;  Location: [place];  Service: Orthopedics;  Laterality: Left;  . BACK SURGERY     lower  . CIRCUMCISION  2010  . FOOT FRACTURE SURGERY Left 1970's   "broke it playing football" (10/12/2012)  . HUMERUS FRACTURE SURGERY W/ IMPLANT Right 1960's   "put a plate in it" (6/96/2952)  . INCISION AND DRAINAGE OF WOUND Left 2006   "foot" (10/12/2012)  . [place] SURGERY  2008       Home Medications    Prior to Admission medications   Medication Sig Start Date End Date Taking? Authorizing Provider  acetaminophen (TYLENOL) 500 MG tablet Take 1,000 mg by  mouth every 6 (six) hours as needed for mild pain.    [provider]  ALPHAGAN P 0.1 % SOLN  12/31/16   [provider]  Capsaicin-Menthol-Methyl Sal (CAPSAICIN-METHYL SAL-MENTHOL) 0.025-1-12 % CREA Apply 1 application topically 4 (four) times daily as needed. 05/16/17   Suzan Slick, NP  celecoxib (CELEBREX) 200 MG capsule Take 1 capsule (200 mg total) 2 (two) times daily by mouth. 05/01/17   Newt Minion, MD  Cyanocobalamin (VITAMIN B-12 PO) Take 1 tablet by mouth daily. 2000 mcg    [provider]  dorzolamide-timolol (COSOPT) 22.3-6.8 MG/ML ophthalmic  solution Place 1 drop into both eyes daily. 08/16/14   [provider]  doxycycline (VIBRA-TABS) 100 MG tablet Take 1 tablet (100 mg total) by mouth 2 (two) times daily. 12/19/16   Suzan Slick, NP  DUREZOL 0.05 % EMUL Place 1 drop into the right eye 2 (two) times daily. 08/27/14   [provider]  gabapentin (NEURONTIN) 300 MG capsule Take 1 capsule (300 mg total) by mouth daily for 1 day, THEN 1 capsule (300 mg total) 2 (two) times daily for 2 days, THEN 1 capsule (300 mg total) 3 (three) times daily. 05/19/17 06/21/17  Dorie Rank, MD  glipiZIDE (GLUCOTROL) 5 MG tablet Take 5 mg by mouth 2 (two) times daily before a meal.    [provider]  ketorolac (ACULAR) 0.4 % SOLN Place 1 drop into both eyes 4 (four) times daily. 09/15/14   [provider]  latanoprost (XALATAN) 0.005 % ophthalmic solution Place 1 drop into the right eye at bedtime. 09/12/14   [provider]  lisinopril (PRINIVIL,ZESTRIL) 20 MG tablet Take 20 mg by mouth every morning.     [provider]  metFORMIN (GLUCOPHAGE) 1000 MG tablet Take 1,000 mg by mouth daily with breakfast.     [provider]  naproxen (NAPROSYN) 500 MG tablet Take 1 tablet (500 mg total) by mouth 2 (two) times daily as needed for mild pain or moderate pain. With meals 12/30/16   Suzan Slick, NP  pyridOXINE (VITAMIN B-6) 100 MG tablet Take 100 mg by mouth daily.    [provider]  silver sulfADIAZINE (SILVADENE) 1 % cream Apply 1 application topically daily. 10/24/16   Suzan Slick, NP    Family History Family History  Problem Relation Age of Onset  . Diabetes Mellitus II Other   . Anesthesia problems Neg Hx     Social History Social History   Tobacco Use  . Smoking status: Current Every Day Smoker    Years: 12.00    Types: Pipe  . Smokeless tobacco: Never Used  . Tobacco comment: 10/12/2012 offered smoking cessation materials; pt declines  Substance Use Topics  . Alcohol use:  No    Alcohol/week: 0.0 oz  . Drug use: No     Allergies   Patient has no known allergies.   Review of Systems Review of Systems  All other systems reviewed and are negative.    Physical Exam Updated Vital Signs BP 136/66 (BP Location: Right Arm)   Pulse (!) 52   Temp 97.8 F (36.6 C) (Oral)   Resp 18   SpO2 100%   Physical Exam  Constitutional: He appears well-developed and well-nourished. No distress.  HENT:  Head: Normocephalic and atraumatic.  Right Ear: External ear normal.  Left Ear: External ear normal.  Eyes: Conjunctivae are normal. Right eye exhibits no discharge. Left eye exhibits no discharge. No  scleral icterus.  Neck: Neck supple. No tracheal deviation present.  Cardiovascular: Normal rate, regular rhythm and intact distal pulses.  Pulmonary/Chest: Effort normal and breath sounds normal. No stridor. No respiratory distress. He has no wheezes. He has no rales.  Abdominal: Soft. Bowel sounds are normal. He exhibits no distension. There is no tenderness. There is no rebound and no guarding.  Musculoskeletal: He exhibits no tenderness.       Right hand: He exhibits no tenderness, no bony tenderness, no deformity, no laceration and no swelling. Normal sensation noted. Normal strength noted.  Strong radial pulse, no erythema full range of motion all digits; mild paraspinal tenderness in the cervical spine; edema noted in the left hand without any tenderness (note, patient had an Ace bandage around his left wrist when he was evaluated at triage that was removed prior to my evaluation)  Neurological: He is alert. He has normal strength. No cranial nerve deficit (no facial droop, extraocular movements intact, no slurred speech) or sensory deficit. He exhibits normal muscle tone. He displays no seizure activity. Coordination normal.  Skin: Skin is warm and dry. No rash noted.  Psychiatric: He has a normal mood and affect.  Nursing note and vitals reviewed.    ED  Treatments / Results  Labs (all labs ordered are listed, but only abnormal results are displayed) Labs Reviewed  CBC - Abnormal; Notable for the following components:      Result Value   RBC 4.08 (*)    Hemoglobin 11.5 (*)    HCT 35.2 (*)    RDW 16.5 (*)    All other components within normal limits  BASIC METABOLIC PANEL  CBG MONITORING, ED  CBG MONITORING, ED    EKG  EKG Interpretation None       Radiology Dg Cervical Spine Complete  Result Date: 05/19/2017 CLINICAL DATA:  76 year old male presenting with right hand numbness x3 months without known injury. EXAM: CERVICAL SPINE - COMPLETE 4+ VIEW COMPARISON:  None. FINDINGS: The patient is edentulous. The temporomandibular joints are maintained in appearance. No acute osseous abnormality of the skullbase. Nuchal ligament ossification is seen posterior to the C4 spinous process. There is maintained cervical lordosis with mild disc space narrowing at all levels of the cervical spine most marked at C6-7. Uncovertebral joint osteoarthritis with uncinate spurring is seen at C5-6 right greater than left. Moderate bilateral C5-6 and mild C6-7 neural foraminal encroachment from osteophytes are noted. No acute cervical spine fracture or listhesis. IMPRESSION: 1. 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 2. Bilateral neural foraminal encroachment C5-6 and C6-7, greater at C5-6 secondary to endplate and uncinate spurring. Electronically Signed   By: Ashley Royalty M.D.   On: 05/19/2017 13:51   Dg Hand Complete Right  Result Date: 05/19/2017 CLINICAL DATA:  76 year old male presenting with right hand numbness x3 months without known injury. EXAM: RIGHT HAND - COMPLETE 3+ VIEW COMPARISON:  None. FINDINGS: There is no evidence of fracture or dislocation. Normal bone mineralization. Osteoarthritis at the first [facility] joint with joint space narrowing and spurring. Probable accessory ossicle between the first and second metacarpals. Vascular  calcifications are identified along the course of the radial artery. IMPRESSION: Osteoarthritis of the first [facility] joint of the right hand. Electronically Signed   By: Ashley Royalty M.D.   On: 05/19/2017 13:54    Procedures Procedures (including critical care time)  Medications Ordered in ED Medications - No data to display   Initial Impression / Assessment and Plan /  ED Course  I have reviewed the triage vital signs and the nursing notes.  Pertinent labs & imaging results that were available during my care of the patient were reviewed by me and considered in my medical decision making (see chart for details).   Patient presented to the emergency room for evaluation of right arm pain that started several months ago.  Patient's exam is suggestive of a cervical radiculopathy.  He may have a component of arthritis in his hand as well.  Recommend continuing NSAIDs.  I also suggested taking Neurontin.  Follow-up with primary doctor or consider seeing a spine surgeon  Final Clinical Impressions(s) / ED Diagnoses   Final diagnoses:  Cervical radiculopathy  Arthritis of hand    ED Discharge Orders        Ordered    gabapentin (NEURONTIN) 300 MG capsule     05/19/17 1504       Dorie Rank, MD 05/19/17 2126

## 2017-05-19 NOTE — ED Triage Notes (Signed)
Patient complains of pain and numbness in the right arm for the last three or four months.  Patient states his right arm is numb, mainly in the hand all of the time but at night he also experiences pain in the finger tips. Grip strengths equal bilaterally, no facial droop, PERRLA.

## 2017-05-19 NOTE — ED Notes (Signed)
CBG taken at 12:47 was 92.

## 2017-05-19 NOTE — ED Notes (Signed)
Pt given snacks at 12:50 -- 3 Saltine crackers and 2 peanut butter cups.

## 2017-05-19 NOTE — Discharge Instructions (Signed)
Increase your Neurontin to the dose suggested.  Consider seeing a spine surgeon for further evaluation.

## 2017-05-19 NOTE — ED Notes (Signed)
Pt has been taking celebrex for pain, states was told he had arthiritis, no relief. Had an ace bandage wrapped tightly on left wrist -- left hand swollen -- positive radial pulse,

## 2017-06-16 ENCOUNTER — Ambulatory Visit (INDEPENDENT_AMBULATORY_CARE_PROVIDER_SITE_OTHER): Payer: Medicare Other | Admitting: Orthopedic Surgery

## 2017-06-23 ENCOUNTER — Ambulatory Visit (INDEPENDENT_AMBULATORY_CARE_PROVIDER_SITE_OTHER): Payer: Medicare Other | Admitting: Orthopedic Surgery

## 2017-06-24 ENCOUNTER — Emergency Department (HOSPITAL_COMMUNITY): Payer: Medicare Other

## 2017-06-24 ENCOUNTER — Encounter (HOSPITAL_COMMUNITY): Payer: Self-pay | Admitting: Emergency Medicine

## 2017-06-24 ENCOUNTER — Emergency Department (HOSPITAL_COMMUNITY)
Admission: EM | Admit: 2017-06-24 | Discharge: 2017-06-24 | Disposition: A | Discharge status: Home / Self Care | Payer: Medicare Other | Attending: Emergency Medicine | Admitting: Emergency Medicine

## 2017-06-24 ENCOUNTER — Other Ambulatory Visit: Payer: Self-pay

## 2017-06-24 DIAGNOSIS — Z79899 Other long term (current) drug therapy: Secondary | ICD-10-CM | POA: Diagnosis not present

## 2017-06-24 DIAGNOSIS — R251 Tremor, unspecified: Secondary | ICD-10-CM | POA: Diagnosis not present

## 2017-06-24 DIAGNOSIS — I10 Essential (primary) hypertension: Secondary | ICD-10-CM | POA: Insufficient documentation

## 2017-06-24 DIAGNOSIS — E11649 Type 2 diabetes mellitus with hypoglycemia without coma: Secondary | ICD-10-CM | POA: Diagnosis not present

## 2017-06-24 DIAGNOSIS — F17210 Nicotine dependence, cigarettes, uncomplicated: Secondary | ICD-10-CM | POA: Diagnosis not present

## 2017-06-24 DIAGNOSIS — E162 Hypoglycemia, unspecified: Secondary | ICD-10-CM

## 2017-06-24 LAB — CBC
HCT: 40.2 % (ref 39.0–52.0)
Hemoglobin: 12.6 g/dL — ABNORMAL LOW (ref 13.0–17.0)
MCH: 27.4 pg (ref 26.0–34.0)
MCHC: 31.3 g/dL (ref 30.0–36.0)
MCV: 87.4 fL (ref 78.0–100.0)
Platelets: 239 10*3/uL (ref 150–400)
RBC: 4.6 MIL/uL (ref 4.22–5.81)
RDW: 16.3 % — ABNORMAL HIGH (ref 11.5–15.5)
WBC: 5.2 10*3/uL (ref 4.0–10.5)

## 2017-06-24 LAB — COMPREHENSIVE METABOLIC PANEL
ALT: 17 U/L (ref 17–63)
AST: 36 U/L (ref 15–41)
Albumin: 3.7 g/dL (ref 3.5–5.0)
Alkaline Phosphatase: 55 U/L (ref 38–126)
Anion gap: 6 (ref 5–15)
BUN: 16 mg/dL (ref 6–20)
CO2: 25 mmol/L (ref 22–32)
Calcium: 8.9 mg/dL (ref 8.9–10.3)
Chloride: 106 mmol/L (ref 101–111)
Creatinine, Ser: 1.27 mg/dL — ABNORMAL HIGH (ref 0.61–1.24)
GFR calc Af Amer: >60 mL/min (ref >60)
GFR calc non Af Amer: 54 mL/min — ABNORMAL LOW (ref >60)
Glucose, Bld: 67 mg/dL (ref 65–99)
Potassium: 5.6 mmol/L — ABNORMAL HIGH (ref 3.5–5.1)
Sodium: 137 mmol/L (ref 135–145)
Total Bilirubin: 0.8 mg/dL (ref 0.3–1.2)
Total Protein: 8.2 g/dL — ABNORMAL HIGH (ref 6.5–8.1)

## 2017-06-24 LAB — I-STAT CHEM 8, ED
BUN: 22 mg/dL — ABNORMAL HIGH (ref 6–20)
Calcium, Ion: 1.17 mmol/L (ref 1.15–1.40)
Chloride: 106 mmol/L (ref 101–111)
Creatinine, Ser: 1.2 mg/dL (ref 0.61–1.24)
Glucose, Bld: 63 mg/dL — ABNORMAL LOW (ref 65–99)
HCT: 42 % (ref 39.0–52.0)
Hemoglobin: 14.3 g/dL (ref 13.0–17.0)
Potassium: 5.7 mmol/L — ABNORMAL HIGH (ref 3.5–5.1)
Sodium: 141 mmol/L (ref 135–145)
TCO2: 27 mmol/L (ref 22–32)

## 2017-06-24 LAB — DIFFERENTIAL
Basophils Absolute: 0 10*3/uL (ref 0.0–0.1)
Basophils Relative: 1 %
Eosinophils Absolute: 0.6 10*3/uL (ref 0.0–0.7)
Eosinophils Relative: 12 %
Lymphocytes Relative: 35 %
Lymphs Abs: 1.8 10*3/uL (ref 0.7–4.0)
Monocytes Absolute: 0.5 10*3/uL (ref 0.1–1.0)
Monocytes Relative: 9 %
Neutro Abs: 2.3 10*3/uL (ref 1.7–7.7)
Neutrophils Relative %: 43 %

## 2017-06-24 LAB — CBG MONITORING, ED
Glucose-Capillary: 59 mg/dL — ABNORMAL LOW (ref 65–99)
Glucose-Capillary: 106 mg/dL — ABNORMAL HIGH (ref 65–99)
Glucose-Capillary: 80 mg/dL (ref 65–99)
Glucose-Capillary: 112 mg/dL — ABNORMAL HIGH (ref 65–99)

## 2017-06-24 LAB — URINALYSIS, ROUTINE W REFLEX MICROSCOPIC
Bilirubin Urine: NEGATIVE
Glucose, UA: 50 mg/dL — AB
Ketones, ur: NEGATIVE mg/dL
Leukocytes, UA: NEGATIVE
Nitrite: NEGATIVE
Protein, ur: NEGATIVE mg/dL
Specific Gravity, Urine: 1.01 (ref 1.005–1.030)
pH: 6 (ref 5.0–8.0)

## 2017-06-24 LAB — RAPID URINE DRUG SCREEN, HOSP PERFORMED
Amphetamines: NOT DETECTED
Barbiturates: NOT DETECTED
Benzodiazepines: NOT DETECTED
Cocaine: NOT DETECTED
Opiates: NOT DETECTED
Tetrahydrocannabinol: NOT DETECTED

## 2017-06-24 LAB — I-STAT TROPONIN, ED: Troponin i, poc: 0.06 ng/mL (ref 0.00–0.08)

## 2017-06-24 LAB — PROTIME-INR
INR: 1.1
Prothrombin Time: 14.1 seconds (ref 11.4–15.2)

## 2017-06-24 LAB — APTT: aPTT: 24 seconds (ref 24–36)

## 2017-06-24 LAB — MAGNESIUM: Magnesium: 2.5 mg/dL — ABNORMAL HIGH (ref 1.7–2.4)

## 2017-06-24 LAB — POTASSIUM: Potassium: 4.6 mmol/L (ref 3.5–5.1)

## 2017-06-24 MED ORDER — DEXTROSE 50 % IV SOLN
25.0000 mL | Freq: Once | INTRAVENOUS | Status: AC
  Administered 2017-06-24: 25 mL via INTRAVENOUS

## 2017-06-24 MED ORDER — DEXTROSE 50 % IV SOLN
INTRAVENOUS | Status: AC
  Filled 2017-06-24: qty 50

## 2017-06-24 MED ORDER — SODIUM CHLORIDE 0.9% FLUSH: 3.0000 mL | INTRAVENOUS | Status: DC | PRN

## 2017-06-24 MED ORDER — SODIUM CHLORIDE 0.9 % IV BOLUS (SEPSIS)
1000.0000 mL | Freq: Once | INTRAVENOUS | Status: AC
  Administered 2017-06-24: 1000 mL via INTRAVENOUS

## 2017-06-24 NOTE — ED Notes (Signed)
Pt ambulated to the end of the hall and back with the walker without difficulty. Pt states that he feels better than before he came in.

## 2017-06-24 NOTE — ED Notes (Signed)
D50 25 ML was given

## 2017-06-24 NOTE — ED Triage Notes (Signed)
Per EMS, pt from home with c/o bilateral extremity weakness and tremors that started at 0900 this am when he woke. Denies ETOH use

## 2017-06-24 NOTE — ED Provider Notes (Signed)
[place] EMERGENCY DEPARTMENT Provider Note   CSN: [id number] Arrival date & time: 06/24/17  1558     History   Chief Complaint Chief Complaint  Patient presents with  . Tremors    HPI [name] is a 77 y.o. male.  HPI  77 year old male with a history of type 2 diabetes presents with shaking/tremors.  Started around 930 this morning.  He states these have been on and off all day.  He has noted some trouble walking and feels off balance sometimes.  He denies a headache or dizziness.  He feels generally weak but denies any focal weakness.  He takes metformin and glipizide for his diabetes.  He has only eaten a banana today.  No chest pain, shortness of breath.  No dysuria.  No new medicines.  He denies any alcohol use.  He states years ago he used to use alcohol but never abused alcohol. 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 Overall patient is a poor historian.  Past Medical History:  Diagnosis Date  . Arthritis   . High cholesterol   . Hypertension   . Osteomyelitis (HCC)    Right great toe  . Peripheral vascular disease ([place])    diabetic  with osteomylitis  . Type II diabetes mellitus [facility])     Patient Active Problem List   Diagnosis Date Noted  . 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 Midfoot ulceration, left, limited to breakdown of skin ([place]) 07/22/2016  . 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 Osteomyelitis of ankle or foot, left, acute ([place]) 08/23/2014  . Cellulitis 10/12/2012  . Chest pain 10/12/2012  . Tobacco abuse 10/12/2012  . ABSCESS, AXILLA, LEFT 02/23/2008  . 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 CHEST WALL PAIN, ACUTE 12/03/2007  . CANDIDIASIS, GLANS PENIS 09/16/2007  . HEADACHE 08/04/2007  . 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 Other and unspecified hyperlipidemia  03/05/2007  . GOUT 03/05/2007  . ERECTILE DYSFUNCTION 03/05/2007  . EXTERNAL HEMORRHOIDS 03/05/2007  . VENTRAL HERNIA 03/05/2007  . FATTY LIVER DISEASE 03/05/2007  . Osteoarthritis 03/05/2007  . HEMORRHOIDS, INTERNAL 03/17/2005  . COLONIC POLYPS, HX OF 03/17/2005    Past Surgical History:  Procedure Laterality Date  . AMPUTATION  07/23/2011   Procedure: AMPUTATION DIGIT;  Surgeon: Newt Minion, MD;  Location: [place];  Service: Orthopedics;  Laterality: Right;  Right Great Toe Amputation  . AMPUTATION Right 09/18/2012   Procedure: Right Foot 2nd Ray Amputation;  Surgeon: Newt Minion, MD;  Location: [place];  Service: Orthopedics;  Laterality: Right;  Right Foot 2nd Ray Amputation  . AMPUTATION Right 10/14/2012   Procedure: AMPUTATION FOOT;  Surgeon: Newt Minion, MD;  Location: [place];  Service: Orthopedics;  Laterality: Right;  Right Foot Transmetatarsal Amputation  . AMPUTATION Left 09/23/2014   Procedure: Left Foot Transmetatarsal Amputation;  Surgeon: Newt Minion, MD;  Location: [place];  Service: Orthopedics;  Laterality: Left;  . BACK SURGERY     lower  . CIRCUMCISION  2010  . FOOT FRACTURE SURGERY Left 1970's   "broke it playing football" (10/12/2012)  . HUMERUS FRACTURE SURGERY W/ IMPLANT Right 1960's   "put a plate in it" (07/19/5425)  . INCISION AND DRAINAGE OF WOUND Left 2006   "foot" (10/12/2012)  . [place] SURGERY  2008       Home Medications  Prior to Admission medications   Medication Sig Start Date End Date Taking? Authorizing Provider  acetaminophen (TYLENOL) 500 MG tablet Take 1,000 mg by mouth every 6 (six) hours as needed for mild pain.   Yes [provider]  ALPHAGAN P 0.1 % SOLN Place 1 drop into both eyes 3 (three) times daily.  12/31/16  Yes [provider]  Cyanocobalamin (VITAMIN B-12 PO) Take 1 tablet by mouth daily. 2000 mcg   Yes [provider]  latanoprost (XALATAN) 0.005 % ophthalmic solution Place 1 drop into the right  eye at bedtime. 09/12/14  Yes [provider]  metFORMIN (GLUCOPHAGE) 1000 MG tablet Take 1,000 mg by mouth daily with breakfast.    Yes [provider]  naproxen (NAPROSYN) 500 MG tablet Take 1 tablet (500 mg total) by mouth 2 (two) times daily as needed for mild pain or moderate pain. With meals 12/30/16  Yes Dondra Prader R, NP  pyridOXINE (VITAMIN B-6) 100 MG tablet Take 100 mg by mouth daily.   Yes [provider]  timolol (TIMOPTIC) 0.5 % ophthalmic solution Place 1 drop into both eyes 2 (two) times daily. 05/19/17  Yes [provider]  Capsaicin-Menthol-Methyl Sal (CAPSAICIN-METHYL SAL-MENTHOL) 0.025-1-12 % CREA Apply 1 application topically 4 (four) times daily as needed. Patient not taking: Reported on 06/24/2017 05/16/17   Suzan Slick, NP  celecoxib (CELEBREX) 200 MG capsule Take 1 capsule (200 mg total) 2 (two) times daily by mouth. Patient not taking: Reported on 06/24/2017 05/01/17   Newt Minion, MD  doxycycline (VIBRA-TABS) 100 MG tablet Take 1 tablet (100 mg total) by mouth 2 (two) times daily. Patient not taking: Reported on 06/24/2017 12/19/16   Suzan Slick, NP  silver sulfADIAZINE (SILVADENE) 1 % cream Apply 1 application topically daily. Patient not taking: Reported on 06/24/2017 10/24/16   Suzan Slick, NP    Family History Family History  Problem Relation Age of Onset  . Diabetes Mellitus II Other   . Anesthesia problems Neg Hx     Social History Social History   Tobacco Use  . Smoking status: Current Every Day Smoker    Years: 12.00    Types: Pipe  . Smokeless tobacco: Never Used  . Tobacco comment: 10/12/2012 offered smoking cessation materials; pt declines  Substance Use Topics  . Alcohol use: No    Alcohol/week: 0.0 oz  . Drug use: No     Allergies   Patient has no known allergies.   Review of Systems Review of Systems  Constitutional: Negative for fever.  Respiratory: Negative for shortness of breath.     Cardiovascular: Negative for chest pain.  Gastrointestinal: Negative for abdominal pain and vomiting.  Genitourinary: Negative for dysuria.  Musculoskeletal: Negative for neck pain.  Neurological: Positive for tremors and weakness. Negative for numbness and headaches.  All other systems reviewed and are negative.    Physical Exam Updated Vital Signs BP (!) 146/78 (BP Location: Right Arm)   Pulse 80   Temp 98 F (36.7 C) (Oral)   Resp 16   SpO2 99%   Physical Exam  Constitutional: He is oriented to person, place, and time. He appears well-developed and well-nourished. No distress.  HENT:  Head: Normocephalic and atraumatic.  Right Ear: External ear normal.  Left Ear: External ear normal.  Nose: Nose normal.  Eyes: Right eye exhibits no discharge. Left eye exhibits no discharge.  Neck: Neck supple.  Cardiovascular: Normal rate, regular rhythm and normal heart sounds.  Pulmonary/Chest: Effort normal and breath sounds normal.  Abdominal: Soft. There is no tenderness.  Musculoskeletal: He exhibits no edema.  Neurological: He is alert and oriented to person, place, and time.  CN 3-12 grossly intact. 5/5 strength in all 4 extremities. Grossly normal sensation. Mild trouble with finger to nose on right, significant difficulty and overshooting with left but does get to finger. Bilateral hand tremor with movement  Skin: Skin is warm and dry. He is not diaphoretic.  Nursing note and vitals reviewed.    ED Treatments / Results  Labs (all labs ordered are listed, but only abnormal results are displayed) Labs Reviewed  CBC - Abnormal; Notable for the following components:      Result Value   Hemoglobin 12.6 (*)    RDW 16.3 (*)    All other components within normal limits  COMPREHENSIVE METABOLIC PANEL - Abnormal; Notable for the following components:   Potassium 5.6 (*)    Creatinine, Ser 1.27 (*)    Total Protein 8.2 (*)    GFR calc non Af Amer 54 (*)    All other components  within normal limits  URINALYSIS, ROUTINE W REFLEX MICROSCOPIC - Abnormal; Notable for the following components:   Color, Urine STRAW (*)    Glucose, UA 50 (*)    Hgb urine dipstick SMALL (*)    Bacteria, UA RARE (*)    Squamous Epithelial / LPF 0-5 (*)    All other components within normal limits  MAGNESIUM - Abnormal; Notable for the following components:   Magnesium 2.5 (*)    All other components within normal limits  CBG MONITORING, ED - Abnormal; Notable for the following components:   Glucose-Capillary 59 (*)    All other components within normal limits  I-STAT CHEM 8, ED - Abnormal; Notable for the following components:   Potassium 5.7 (*)    BUN 22 (*)    Glucose, Bld 63 (*)    All other components within normal limits  CBG MONITORING, ED - Abnormal; Notable for the following components:   Glucose-Capillary 106 (*)    All other components within normal limits  CBG MONITORING, ED - Abnormal; Notable for the following components:   Glucose-Capillary 112 (*)    All other components within normal limits  PROTIME-INR  APTT  DIFFERENTIAL  RAPID URINE DRUG SCREEN, HOSP PERFORMED  POTASSIUM  ETHANOL  I-STAT TROPONIN, ED  CBG MONITORING, ED  CBG MONITORING, ED  CBG MONITORING, ED    EKG  EKG Interpretation  Date/Time:  Tuesday June 24 2017 18:13:45 EST Ventricular Rate:  52 PR Interval:  198 QRS Duration: 116 QT Interval:  498 QTC Calculation: 463 R Axis:   -33 Text Interpretation:  Sinus bradycardia Left axis deviation Right bundle branch block Abnormal ECG no significant change since 2015 Confirmed by Sherwood Gambler [phone]) on 06/24/2017 7:30:22 PM       Radiology Ct Head Wo Contrast  Result Date: 06/24/2017 CLINICAL DATA:  Ataxia. Stroke suspected. Bilateral extremity weakness tremors beginning at 9 a.m. today. EXAM: CT HEAD WITHOUT CONTRAST TECHNIQUE: Contiguous axial images were obtained from the base of the skull through the vertex without intravenous  contrast. COMPARISON:  CT head without contrast 08/02/2007 FINDINGS: Brain: Mild atrophy has developed since the prior exam. Mild white matter changes are noted as well. No acute infarct, hemorrhage, or mass lesion is present. The brainstem and cerebellum are normal. Diffuse dural calcifications are again noted. Vascular: No hyperdense vessel or unexpected calcification. Skull: Calvarium  is intact.  Hyperostosis is noted. Sinuses/Orbits: The paranasal sinuses and mastoid air cells are clear. A partial scleral band is noted on the right. IMPRESSION: 1. Progressive atrophy without acute intracranial abnormality. Electronically Signed   By: San Morelle M.D.   On: 06/24/2017 17:19   Mr Brain Wo Contrast  Result Date: 06/24/2017 CLINICAL DATA:  Initial evaluation for acute ataxia. EXAM: MRI HEAD WITHOUT CONTRAST TECHNIQUE: Multiplanar, multiecho pulse sequences of the brain and surrounding structures were obtained without intravenous contrast. COMPARISON:  Prior CT from earlier the same day. FINDINGS: Brain: Cerebral volume within normal limits. No significant cerebral white matter changes for age. No abnormal foci of restricted diffusion to suggest acute or subacute ischemia. Gray-white matter differentiation maintained. No evidence for chronic infarction. No acute or chronic intracranial hemorrhage. No mass lesion, midline shift or mass effect. No hydrocephalus. No extra-axial fluid collection. Major dural sinuses are patent. Pituitary gland suprasellar region normal. Midline structures intact and normal. Vascular: Major intracranial vascular flow voids are maintained. Skull and upper cervical spine: Craniocervical junction normal. Upper cervical spine within normal limits. Bone marrow signal intensity normal. Hyperostosis frontalis interna noted. Scalp soft tissues normal. Sinuses/Orbits: Globes and orbital soft tissues within normal limits. Patient status post lens extraction bilaterally. Paranasal  sinuses are largely clear. No mastoid effusion. Inner ear structures normal. Other: None. IMPRESSION: Normal brain MRI for age. No acute intracranial abnormality identified. Electronically Signed   By: Jeannine Boga M.D.   On: 06/24/2017 20:51    Procedures Procedures (including critical care time)  Medications Ordered in ED Medications  sodium chloride flush (NS) 0.9 % injection 3 mL (not administered)  dextrose 50 % solution 25 mL (25 mLs Intravenous Given 06/24/17 1635)  sodium chloride 0.9 % bolus 1,000 mL (0 mLs Intravenous Stopped 06/24/17 2307)     Initial Impression / Assessment and Plan / ED Course  I have reviewed the triage vital signs and the nursing notes.  Pertinent labs & imaging results that were available during my care of the patient were reviewed by me and considered in my medical decision making (see chart for details).  Clinical Course as of Jun 25 132  Tue Jun 24, 2017  1743 Neuro recommends MRI, call back after results. Tremor improved but still present after d50  [SG]    Clinical Course User Index [SG] Sherwood Gambler, MD    Tremors seem to have essentially resolved.  Occasionally minimally present.  Could be related to hypoglycemia but even after initial fixing of the glucose, tremors were still present but less prominent.  He had some initial trouble walking to the bathroom.  MRI does not show an acute stroke.  Neurology estimates this is probably from Neurontin, especially with mild bump in creatinine.  Potassium was rechecked and the initial elevated potassium was probably hemolysis.  Given IV fluids.  Has been eating and drinking normally in the ED and no further hypoglycemia.  Thus, stop Neurontin per neurology and stop glipizide for now.  Encouraged to follow-up closely with PCP for better outpatient medication adjustment.  Return precautions.  Final Clinical Impressions(s) / ED Diagnoses   Final diagnoses:  Tremor  Hypoglycemia    ED Discharge  Orders    None       Sherwood Gambler, MD 06/25/17 [phone]

## 2017-06-24 NOTE — ED Notes (Signed)
Pt ambulate to the restroom with 2 RN's. Pt needed maximum assistance ambulating back to the stretcher and was unstable.

## 2017-06-24 NOTE — Discharge Instructions (Signed)
Discontinue your glipizide and Neurontin.  Call your primary care doctor for close follow-up within this week to help arrange for new medicines.  Follow-up with neurology for the tremors.  Be sure to check your glucose as instructed and if your sugar becomes low again call 911 or see your doctor immediately.

## 2017-06-24 NOTE — ED Notes (Signed)
Pt verbalized understanding to stop taking gabentin and glipizide and to follow up with neuro and pcp. VSS. Cab called for patient.

## 2017-06-27 ENCOUNTER — Telehealth (INDEPENDENT_AMBULATORY_CARE_PROVIDER_SITE_OTHER): Payer: Self-pay | Admitting: Orthopedic Surgery

## 2017-06-27 ENCOUNTER — Other Ambulatory Visit (INDEPENDENT_AMBULATORY_CARE_PROVIDER_SITE_OTHER): Payer: Self-pay | Admitting: Family

## 2017-06-27 NOTE — Telephone Encounter (Signed)
Patient called asking for a refill on his pain medication for his hand pain. If it can be called in, he would like it sent into the [facility]. CB # [phone]

## 2017-06-27 NOTE — Telephone Encounter (Signed)
IC patient and asked what pain medication he needs and he says he needs cortisone pills???  He is only taking tylenol.  Please advise on a medication for him?

## 2017-06-27 NOTE — Telephone Encounter (Signed)
IC patient and discussed further, he did have an injection per Erlinda Hong and then per [place] after that. He will take relafen, given to him by PCP, see if that helps, if not he will let us know at his appt on Tuesday for his foot.

## 2017-07-01 ENCOUNTER — Encounter (INDEPENDENT_AMBULATORY_CARE_PROVIDER_SITE_OTHER): Payer: Self-pay | Admitting: Orthopedic Surgery

## 2017-07-01 ENCOUNTER — Ambulatory Visit (INDEPENDENT_AMBULATORY_CARE_PROVIDER_SITE_OTHER): Payer: Medicare Other | Admitting: Orthopedic Surgery

## 2017-07-01 VITALS — Ht 73.0 in | Wt 212.0 lb

## 2017-07-01 DIAGNOSIS — G5601 Carpal tunnel syndrome, right upper limb: Secondary | ICD-10-CM | POA: Diagnosis not present

## 2017-07-01 DIAGNOSIS — L97421 Non-pressure chronic ulcer of left heel and midfoot limited to breakdown of skin: Secondary | ICD-10-CM

## 2017-07-01 DIAGNOSIS — E1151 Type 2 diabetes mellitus with diabetic peripheral angiopathy without gangrene: Secondary | ICD-10-CM

## 2017-07-01 DIAGNOSIS — Z89432 Acquired absence of left foot: Secondary | ICD-10-CM

## 2017-07-01 DIAGNOSIS — I70209 Unspecified atherosclerosis of native arteries of extremities, unspecified extremity: Secondary | ICD-10-CM

## 2017-07-01 NOTE — Progress Notes (Signed)
Office Visit Note   Patient: [name]           Date of Birth: [birth year]           MRN: [id number] Visit Date: 07/01/2017              Requested by: No referring provider defined for this encounter. PCP: System, Pcp Not In  Chief Complaint  Patient presents with  . 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  Patient is status post a first dorsal extensor compartment injection of the left wrist which completely relieved his symptoms in the left wrist.  Patient states that he develops symptoms in the right hand with pain numbness and tingling in all of his fingers distal to the PIP joint.  Patient states that this woke him up last night and he was crying in pain.  Assessment & Plan: Visit Diagnoses:  1. Midfoot ulceration, left, limited to breakdown of skin ([place])   2. Status post transmetatarsal amputation of left foot ([place])   3. 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 Carpal tunnel syndrome, right upper limb     Plan: We will place him in a Velcro splint for the right wrist he does have symptoms with carpal tunnel symptoms.  Patient will continue with wound care for the left foot continue with his postoperative shoe.  Follow-Up Instructions: Return in about 3 weeks (around 07/22/2017).   Ortho Exam  Patient is alert, oriented, no adenopathy, well-dressed, normal affect, normal respiratory effort. Examination patient has a large ulcer beneath the midfoot left transmetatarsal amputation.  There is good granulation tissue at the base.  After informed consent a 10 blade knife was used to debride the skin and soft tissue back to healthy viable granulation tissue this was touched with silver nitrate.  The ulcer is 2 cm in diameter 5 mm deep this does not probe to bone or tendon.  Patient also has an ulcer over the heel this was debrided with a 10  blade knife as well this ulcer is 10 mm in diameter 3 mm deep and there is no signs of infection at this location.  Examination the right wrist his first dorsal extensor compartment is nontender to palpation a Finkelstein's test is negative the scaphoid scapholunate and TFCC are nontender to palpation he has full range of motion of all digits.  A Phalen and Tinel carpal tunnel tests are mildly positive.  Imaging: No results found. No images are attached to the encounter.  Labs: Lab Results  Component Value Date   HGBA1C 8.1 (H) 08/23/2014   HGBA1C 11.3 (H) 10/12/2012   HGBA1C greater than 14 02/23/2008   ESRSEDRATE 76 (H) 09/06/2014   ESRSEDRATE 28 (H) 08/23/2014   ESRSEDRATE 88 (H) 10/12/2012   CRP 15.8 (H) 09/06/2014   CRP 2.3 (H) 08/23/2014   REPTSTATUS 10/25/2013 FINAL 10/24/2013   CULT NO GROWTH Performed at [facility] 10/24/2013    @LABSALLVALUES (HGBA1)@  Body mass index is 27.97 kg/m.  Orders:  No orders of the defined types were placed in this encounter.  No orders of the defined types were placed in this encounter.    Procedures: No procedures performed  Clinical Data: No additional findings.  ROS:  All other systems negative, except as noted in the HPI. Review of Systems  Objective: Vital Signs: Ht 6\' 1"  (  1.854 m)   Wt 212 lb (96.2 kg)   BMI 27.97 kg/m   Specialty Comments:  No specialty comments available.  PMFS History: Patient Active Problem List   Diagnosis Date Noted  . Carpal tunnel syndrome, right upper limb 07/01/2017  . Arthritis of carpometacarpal [facility]) joint of left thumb 12/30/2016  . Midfoot ulceration, left, limited to breakdown of skin ([place]) 07/22/2016  . 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[place]) 09/23/2014  . Osteomyelitis of ankle or foot, left, acute ([place]) 08/23/2014  . Cellulitis 10/12/2012  . Chest pain 10/12/2012  . Tobacco abuse 10/12/2012  . ABSCESS, AXILLA, LEFT 02/23/2008  . 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 CHEST WALL PAIN, ACUTE 12/03/2007  . CANDIDIASIS, GLANS PENIS 09/16/2007  . HEADACHE 08/04/2007  . 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 Other and unspecified hyperlipidemia 03/05/2007  . GOUT 03/05/2007  . ERECTILE DYSFUNCTION 03/05/2007  . EXTERNAL HEMORRHOIDS 03/05/2007  . VENTRAL HERNIA 03/05/2007  . FATTY LIVER DISEASE 03/05/2007  . Osteoarthritis 03/05/2007  . HEMORRHOIDS, INTERNAL 03/17/2005  . COLONIC POLYPS, HX OF 03/17/2005   Past Medical History:  Diagnosis Date  . Arthritis   . High cholesterol   . Hypertension   . Osteomyelitis (HCC)    Right great toe  . Peripheral vascular disease ([place])    diabetic  with osteomylitis  . Type II diabetes mellitus (HCC)     Family History  Problem Relation Age of Onset  . Diabetes Mellitus II Other   . Anesthesia problems Neg Hx     Past Surgical History:  Procedure Laterality Date  . AMPUTATION  07/23/2011   Procedure: AMPUTATION DIGIT;  Surgeon: Newt Minion, MD;  Location: [place];  Service: Orthopedics;  Laterality: Right;  Right Great Toe Amputation  . AMPUTATION Right 09/18/2012   Procedure: Right Foot 2nd Ray Amputation;  Surgeon: Newt Minion, MD;  Location: [place];  Service: Orthopedics;  Laterality: Right;  Right Foot 2nd Ray Amputation  . AMPUTATION Right 10/14/2012   Procedure: AMPUTATION FOOT;  Surgeon: Newt Minion, MD;  Location: [place];  Service: Orthopedics;  Laterality: Right;  Right Foot Transmetatarsal Amputation  . AMPUTATION Left 09/23/2014   Procedure: Left Foot Transmetatarsal Amputation;  Surgeon: Newt Minion, MD;  Location: [place];  Service: Orthopedics;  Laterality: Left;  . BACK SURGERY     lower  . CIRCUMCISION  2010  . FOOT FRACTURE SURGERY Left 1970's   "broke it playing football" (10/12/2012)  . HUMERUS FRACTURE SURGERY W/ IMPLANT Right 1960's   "put a plate in it" (01/24/9832)  . INCISION AND DRAINAGE  OF WOUND Left 2006   "foot" (10/12/2012)  . LUMBAR [place] SURGERY  2008   Social History   Occupational History  . Not on file  Tobacco Use  . Smoking status: Current Every Day Smoker    Years: 12.00    Types: Pipe  . Smokeless tobacco: Never Used  . Tobacco comment: 10/12/2012 offered smoking cessation materials; pt declines  Substance and Sexual Activity  . Alcohol use: No    Alcohol/week: 0.0 oz  . Drug use: No  . Sexual activity: Not Currently

## 2017-07-21 ENCOUNTER — Other Ambulatory Visit (INDEPENDENT_AMBULATORY_CARE_PROVIDER_SITE_OTHER): Payer: Self-pay | Admitting: Family

## 2017-07-24 ENCOUNTER — Ambulatory Visit (INDEPENDENT_AMBULATORY_CARE_PROVIDER_SITE_OTHER): Payer: Medicare Other | Admitting: Family

## 2017-07-24 ENCOUNTER — Telehealth (INDEPENDENT_AMBULATORY_CARE_PROVIDER_SITE_OTHER): Payer: Self-pay | Admitting: Family

## 2017-07-24 ENCOUNTER — Ambulatory Visit (INDEPENDENT_AMBULATORY_CARE_PROVIDER_SITE_OTHER): Payer: Medicare Other | Admitting: Orthopedic Surgery

## 2017-07-24 NOTE — Telephone Encounter (Signed)
error 

## 2017-07-28 ENCOUNTER — Encounter: Payer: Self-pay | Admitting: Neurology

## 2017-07-28 ENCOUNTER — Ambulatory Visit: Payer: Medicare Other | Admitting: Neurology

## 2017-07-28 ENCOUNTER — Other Ambulatory Visit (INDEPENDENT_AMBULATORY_CARE_PROVIDER_SITE_OTHER): Payer: Medicare Other

## 2017-07-28 VITALS — BP 130/70 | HR 66 | Ht 73.0 in | Wt 190.4 lb

## 2017-07-28 DIAGNOSIS — M79641 Pain in right hand: Secondary | ICD-10-CM

## 2017-07-28 DIAGNOSIS — E0842 Diabetes mellitus due to underlying condition with diabetic polyneuropathy: Secondary | ICD-10-CM

## 2017-07-28 LAB — SEDIMENTATION RATE: Sed Rate: 75 mm/hr — ABNORMAL HIGH (ref 0–20)

## 2017-07-28 LAB — FOLATE: Folate: 5.5 ng/mL — ABNORMAL LOW (ref >5.9)

## 2017-07-28 LAB — VITAMIN B12: Vitamin B-12: 694 pg/mL (ref 211–911)

## 2017-07-28 LAB — C-REACTIVE PROTEIN: CRP: 1.8 mg/dL (ref 0.5–20.0)

## 2017-07-28 MED ORDER — PREGABALIN 100 MG PO CAPS: 100.0000 mg | ORAL_CAPSULE | Freq: Two times a day (BID) | ORAL | 5 refills | Status: DC

## 2017-07-28 NOTE — Progress Notes (Signed)
[name] - Initial Visit   Date: 07/28/17  [name] MRN: [id number] DOB: Jun 11, 1941   Dear Dr. Fara Olden:  Thank you for your kind referral of [name] for consultation of right hand pain. Although his history is well known to you, please allow [place] to reiterate it for the purpose of our medical record. The patient was accompanied to the clinic by self.   History of Present Illness: [name] is a 77 y.o. 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    Starting around the fall of 2018, he develops sharp achy pain over the all the fingers over the palmer and dorsal surface.  Pain is not present during the day, but wakes him up from sleeping at night time and keeps him awake.  Pain can be excruciating and bring him to tears sometimes. . Movement of the fingers, such as extension and making a fist makes his pain much worse.  He feels that his fingers are also numb.  He was given a wrist splint to wear, but he had not noticed any improvement.   He used to have similar pain in the left wrist about 6 months ago, but this improved with steroid injection to the wrist.  He denies any weakness of the hands.   He has known diabetic neuropathy in the feet and up to the mid-calf bilaterally.   He was taking gabapentin 326m 1 tablet TID, which was discontinued due to tremors for which he went to the ER.   He was given percocet from the ER which alleviated his pain.   He is now on Lyrica 78m but does not know whether he takes it daily or twice daily.  Out-side paper records, electronic medical record, and images have been reviewed where available and summarized as:  MRI brain wo contrast 06/24/2017:  Normal brain MRI for age. No acute intracranial  abnormality identified.  Labs 06/27/2017:  HbA1c 5.7  Lab Results  Component Value Date   CREATININE 1.20 06/24/2017   BUN 22 (H) 06/24/2017   NA 141 06/24/2017   K 4.6 06/24/2017   CL 106 06/24/2017   CO2 25 06/24/2017    Past Medical History:  Diagnosis Date  . Arthritis   . High cholesterol   . Hypertension   . Osteomyelitis (HCC)    Right great toe  . [place]ipheral vascular disease ([facility]   diabetic  with osteomylitis  . Type II diabetes mellitus ([facility]    Past Surgical History:  Procedure Laterality Date  . AMPUTATION  07/23/2011   Procedure: AMPUTATION DIGIT;  Surgeon: MaNewt MinionMD;  [facility];  Laterality: Right;  Right Great Toe Amputation  . AMPUTATION Right 09/18/2012   Procedure: Right Foot 2nd Ray Amputation;  Surgeon: MaNewt MinionMD;  [facility]: [facility];  Laterality: Right;  Right Foot 2nd Ray Amputation  . AMPUTATION Right 10/14/2012   Procedure: AMPUTATION FOOT;  Surgeon: MaNewt MinionMD;  [facility]: [facility];  Laterality: Right;  Right Foot Transmetatarsal Amputation  . AMPUTATION Left 09/23/2014   Procedure: Left Foot Transmetatarsal Amputation;  Surgeon: MaNewt MinionMD;  [facility] Service: Orthopedics;  Laterality: Left;  . BACK SURGERY     lower  . CIRCUMCISION  2010  . FOOT FRACTURE SURGERY Left 1970's   "broke it playing football" (10/12/2012)  . HUMERUS FRACTURE SURGERY W/ IMPLANT Right 1960's   "put a plate in it" (5/88/5027)  . INCISION AND DRAINAGE OF WOUND Left 2006   "foot" (10/12/2012)  . [place] SURGERY  2008     Medications:  Outpatient Encounter Medications as of 07/28/2017  Medication Sig  . acetaminophen (TYLENOL) 500 MG tablet Take 1,000 mg by mouth every 6 (six) hours as needed for mild pain.  . ALPHAGAN P 0.1 % SOLN Place 1 drop into both eyes 3 (three) times daily.   . Capsaicin-Menthol-Methyl Sal (CAPSAICIN-METHYL SAL-MENTHOL) 0.025-1-12 % CREA  Apply 1 application topically 4 (four) times daily as needed.  . Cyanocobalamin (VITAMIN B-12 PO) Take 1 tablet by mouth daily. 2000 mcg  . latanoprost (XALATAN) 0.005 % ophthalmic solution Place 1 drop into the right eye at bedtime.  . metFORMIN (GLUCOPHAGE) 1000 MG tablet Take 1,000 mg by mouth daily with breakfast.   . pyridOXINE (VITAMIN B-6) 100 MG tablet Take 100 mg by mouth daily.  . silver sulfADIAZINE (SILVADENE) 1 % cream Apply 1 application topically daily.  . timolol (TIMOPTIC) 0.5 % ophthalmic solution Place 1 drop into both eyes 2 (two) times daily.  . pregabalin (LYRICA) 100 MG capsule Take 1 capsule (100 mg total) by mouth 2 (two) times daily.  . [DISCONTINUED] celecoxib (CELEBREX) 200 MG capsule Take 1 capsule (200 mg total) 2 (two) times daily by mouth.  . [DISCONTINUED] doxycycline (VIBRA-TABS) 100 MG tablet Take 1 tablet (100 mg total) by mouth 2 (two) times daily.  . [DISCONTINUED] nabumetone (RELAFEN) 750 MG tablet Take 1 tablet (750 mg total) by mouth 2 (two) times daily as needed for mild pain or moderate pain. with food   No facility-administered encounter medications on file as of 07/28/2017.      Allergies: No Known Allergies  Family History: Family History  Problem Relation Age of Onset  . Diabetes Mellitus II Other   . Anesthesia problems Neg Hx     Social History: Social History   Tobacco Use  . Smoking status: Current Every Day Smoker    Years: 12.00    Types: Pipe  . Smokeless tobacco: Never Used  . Tobacco comment: 10/12/2012 offered smoking cessation materials; pt declines  Substance Use Topics  . Alcohol use: No    Alcohol/week: 0.0 oz  . Drug use: No   Social History   Social History Narrative   Lives alone in an apartment on the first floor.  Has one daughter.  Retired Games developer.  Education: 12th grade.     Review of Systems:  CONSTITUTIONAL: No fevers, chills, night sweats, or weight loss.   EYES: +visual changes or eye  pain ENT: No hearing changes.  No history of nose bleeds.   RESPIRATORY: No cough, wheezing and shortness of breath.   CARDIOVASCULAR: Negative for chest pain, and palpitations.   GI: Negative for abdominal discomfort, blood in stools or black stools.  No recent change in bowel habits.   GU:  No history of incontinence.   MUSCLOSKELETAL: + history of joint pain or swelling.  No myalgias.   SKIN: +r lesions, rash, and itching.   HEMATOLOGY/ONCOLOGY: Negative for prolonged bleeding, bruising easily, and swollen nodes.  No history of cancer.   ENDOCRINE: Negative for cold or heat intolerance, polydipsia or goiter.   PSYCH:  No depression or anxiety symptoms.   NEURO: As Above.   Vital  Signs:  BP 130/70   Pulse 66   Ht _0  (1.854 m)   Wt 190 lb 6 oz (86.4 kg)   SpO2 96%   BMI 25.12 kg/m    General Medical Exam:   General:  Well appearing, comfortable.   Eyes/ENT: see cranial nerve examination.   Neck:   Full range of motion without tenderness.  No carotid bruits. Respiratory:  Clear to auscultation, good air entry bilaterally.   Cardiac:  Regular rate and rhythm, no murmur.   Extremities:  S/p bilateral mid-foot amputation, right foot with malodorous discharge at the stump  Neurological Exam: MENTAL STATUS including orientation to time, place, person, recent and remote memory, attention span and concentration, language, and fund of knowledge is normal.  Speech is not dysarthric.  CRANIAL NERVES: II:  No visual field defects.  Limited fundoscopic exam due to small pupils.  Conjunctiva is injected bilaterally, worse on the right.   III-IV-VI: Pupils equal round and reactive to light.  Normal conjugate, extra-ocular eye movements in all directions of gaze.  No nystagmus.  No ptosis.   V:  Normal facial sensation.   VII:  Normal facial symmetry and movements. VIII:  Normal hearing and vestibular function.   IX-X:  Normal palatal movement.   XI:  Normal shoulder shrug and head  rotation.   XII:  Normal tongue strength and range of motion, no deviation or fasciculation.  MOTOR:  There is mild intrinsic hand muscle atrophy bilaterally.  No fasciculations or abnormal movements.  No pronator drift.  Tone is normal.    Right Upper Extremity:    Left Upper Extremity:    Deltoid  5/5   Deltoid  5/5   Biceps  5/5   Biceps  5/5   Triceps  5/5   Triceps  5/5   Wrist extensors  5/5   Wrist extensors  5/5   Wrist flexors  5/5   Wrist flexors  5/5   Finger extensors  5/5   Finger extensors  5/5   Finger flexors  5/5   Finger flexors  5/5   Dorsal interossei  5-/5   Dorsal interossei  5-/5   Abductor pollicis  5-/5   Abductor pollicis  5-/5   Tone (Ashworth scale)  0  Tone (Ashworth scale)  0   Right Lower Extremity:    Left Lower Extremity:    Hip flexors  5/5   Hip flexors  5/5   Hip extensors  5/5   Hip extensors  5/5   Knee flexors  5/5   Knee flexors  5/5   Knee extensors  5/5   Knee extensors  5/5   Dorsiflexors  5-/5   Dorsiflexors  5-/5   Plantarflexors  5-/5   Plantarflexors  5-/5   Toe extensors  n/a   Toe extensors  n/a   Toe flexors  n/a  Toe flexors  n/a   Tone (Ashworth scale)  0  Tone (Ashworth scale)  0   MSRs:  Right                                                                 Left brachioradialis 2+  brachioradialis 2+  biceps 2+  biceps 2+  triceps 2+  triceps 2+  patellar 0  patellar 0  ankle jerk 0  ankle jerk 0  Hoffman no  Hoffman no  plantar response down  plantar response down   SENSORY:  Vibration is trace at the knees, intact at the fingers and elbows.  Pin prick and temperature is slightly reduced at the fingers.  Rhomberg sign is present.  COORDINATION/GAIT: Normal finger-to- nose-finger.  Intact rapid alternating movements bilaterally.  Gait is slightly wide-based, assisted with cane.  IMPRESSION: 1.  Right hand pain and paresthesias concerning for entrapment neuropathy on superimposed diabetic neuropathy.  Recommend NCS/EMG of  the right > left arms to assess for asymmetrical findings, which would help localize his symptoms.  In the meantime, I will titrate his Lyrica to 150m twice daily, limit titration to 3045md due to renal function; if this is cost prohibitive, start nortriptyline.  Gabapentin was discontinued due to side effects (tremors). Continue to wear wrist splint. Check vitamin B12, folate, vitamin B1, copper, SPEP with IFE, ESR, CRP.    2.  S/p bilateral midfoot amputation with R foot with malodorous discharge, follow-up with orthopeadics  Thank you for allowing me to participate in patient's care.  If I can answer any additional questions, I would be pleased to do so.    Sincerely,    Donika K. PaPosey ProntoDO

## 2017-07-28 NOTE — Patient Instructions (Signed)
NCS/EMG of the right > left arm   Check labs  Increase Lyrica to 100mg  twice daily, if this is too expensive, call my office and we will send a different prescription

## 2017-07-31 ENCOUNTER — Ambulatory Visit (INDEPENDENT_AMBULATORY_CARE_PROVIDER_SITE_OTHER): Payer: Medicare Other | Admitting: Orthopedic Surgery

## 2017-08-03 LAB — IMMUNOFIXATION ELECTROPHORESIS
IgG (Immunoglobin G), Serum: 2206 mg/dL — ABNORMAL HIGH (ref 694–1618)
IgM, Serum: 57 mg/dL (ref 48–271)
Immunoglobulin A: 562 mg/dL — ABNORMAL HIGH (ref 81–463)

## 2017-08-03 LAB — VITAMIN B1: Vitamin B1 (Thiamine): <6 nmol/L — ABNORMAL LOW (ref 8–30)

## 2017-08-03 LAB — PROTEIN ELECTROPHORESIS, SERUM
Albumin ELP: 4.1 g/dL (ref 3.8–4.8)
Alpha 1: 0.3 g/dL (ref 0.2–0.3)
Alpha 2: 0.8 g/dL (ref 0.5–0.9)
Beta 2: 0.5 g/dL (ref 0.2–0.5)
Beta Globulin: 0.5 g/dL (ref 0.4–0.6)
Gamma Globulin: 2 g/dL — ABNORMAL HIGH (ref 0.8–1.7)
Total Protein: 8.2 g/dL — ABNORMAL HIGH (ref 6.1–8.1)

## 2017-08-03 LAB — COPPER, SERUM: Copper: 123 ug/dL (ref 70–175)

## 2017-08-05 ENCOUNTER — Telehealth: Payer: Self-pay | Admitting: *Deleted

## 2017-08-05 NOTE — Telephone Encounter (Signed)
-----   Message from Alda Berthold, DO sent at 08/04/2017  3:27 PM EST ----- Please inform pt that his vitamin B1 and folate is very low.  Start vitamin b1 100mg  daily and folic acid 1mg  daily.  Thanks.

## 2017-08-05 NOTE — Telephone Encounter (Signed)
Patient given results and instructions.  I called his pharmacy and they will deliver the OTC meds to him.

## 2017-08-06 ENCOUNTER — Telehealth: Payer: Self-pay | Admitting: Neurology

## 2017-08-06 NOTE — Telephone Encounter (Signed)
He should be on Lyrica 100mg  twice daily - please confirm.  He can increase this to Lyrica 100mg  three times daily.  Because of his renal function, cannot dose higher than total of Lyrica 300mg  per day.  If he has already tried Lyrica 100mg  three times daily, then we can start nortriptyline 10mg  at bedtime.  Emilyanne Mcgough K. Posey Pronto, DO

## 2017-08-06 NOTE — Telephone Encounter (Signed)
Please advise.  You titrated his lyrica to 200 mg bid according to your last note.

## 2017-08-06 NOTE — Telephone Encounter (Signed)
Pt called and said he is in a lot of pain and really needs something for the pain, wanted a call back ASAP

## 2017-08-06 NOTE — Telephone Encounter (Signed)
Patient instructed to increase the lyrica to tid and to call me on Monday if not any better.

## 2017-08-14 ENCOUNTER — Ambulatory Visit (INDEPENDENT_AMBULATORY_CARE_PROVIDER_SITE_OTHER): Payer: Medicare Other | Admitting: Neurology

## 2017-08-14 DIAGNOSIS — G5601 Carpal tunnel syndrome, right upper limb: Secondary | ICD-10-CM

## 2017-08-14 DIAGNOSIS — E0842 Diabetes mellitus due to underlying condition with diabetic polyneuropathy: Secondary | ICD-10-CM

## 2017-08-14 MED ORDER — DULOXETINE HCL 30 MG PO CPEP: 30.0000 mg | ORAL_CAPSULE | Freq: Every day | ORAL | 3 refills | Status: DC

## 2017-08-14 NOTE — Progress Notes (Signed)
    Follow-up Visit   Date: 08/14/17    [name] MRN: [id number] DOB: 01/12/1941   Interim History: [name] is a 77 y.o. 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   Despite being on Lyrica 100 mg 3 times daily, he continues to have severe pain in the right hand which has been waking him up from sleeping.  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 Additionally, there is a superimposed right carpal tunnel syndrome, severe in degree electrically.  With his clinical history and nerve conduction findings, his right hand pain may certainly be due to carpal tunnel syndrome.    For his pain, I will keep him on Lyrica 100 mg 3 times daily and add Cymbalta 30 mg daily.   He has been using a wrist splint without any benefit and based on the severity of his symptoms on EMG, I would like for him to be evaluated for CTS release.  He is already followed by Dr. Sharol Given and patient has an upcoming appointment next week.  The duration of this appointment visit was 18 minutes of face-to-face time with the patient, excluding procedure time.  Greater than 90% of this time was spent in counseling, explanation of diagnosis, planning of further management, and coordination of care.   Thank you for allowing me to participate in patient's care.  If I can answer any additional questions, I would be pleased to do so.    Sincerely,    Donika K. Posey Pronto, DO

## 2017-08-14 NOTE — Procedures (Signed)
[facility]  [place], [address]  [place], [place] [address] Tel: [phone] Fax:  [phone] Test Date:  08/14/2017  Patient: [name] DOB: 01/04/1941 Physician: Narda Amber, DO  Sex: Male Height: 6\' 1"  Ref Phys: Narda Amber, DO  ID#: [id number] Temp: 33.4C Technician:    Patient Complaints: This is a 77 year old man with diabetic neuropathy referred for evaluation of right hand pain and paresthesias.  NCV & EMG Findings: Extensive electrodiagnostic testing of the right upper extremity and additional studies of the left shows:  1. Bilateral median and left radial sensory responses are absent. Right radial and bilateral ulnar sensory responses show reduced amplitude, which is worse on the left where there is also latency prolongation. 2. Right median motor response shows severely prolonged latency (6.8 ms) and reduced amplitude (2.2 mV).  Left median motor responses within normal limits. Bilateral ulnar motor responses show conduction velocity slowing across the elbow. 3. Chronic motor axon loss changes are seen affecting the right abductor pollicis brevis muscle, without accompanied active denervation.   Impression: 1. 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 2. There is a superimposed right median neuropathy at or distal to the wrist, consistent with clinical diagnosis of carpal tunnel syndrome, which is severe in degree electrically.   ___________________________ Narda Amber, DO   Nerve Conduction Studies Anti Sensory Summary Table   Site NR Peak (ms) Norm Peak (ms) P-T Amp (V) Norm P-T Amp  Left Median Anti Sensory (2nd Digit)  Wrist NR  <3.8  >10  Right Median Anti Sensory (2nd Digit)  Wrist NR  <3.8  >10  Left Radial Anti Sensory (Base 1st Digit)  Wrist NR  <2.8  >10  Right Radial Anti Sensory (Base 1st Digit)  Wrist     2.9 <2.8 4.4 >10  Left Ulnar Anti Sensory (5th Digit)  Wrist    4.0 <3.2 0.9 >5  Right Ulnar Anti Sensory (5th Digit)  Wrist    3.1 <3.2 4.8 >5   Motor Summary Table   Site NR Onset (ms) Norm Onset (ms) O-P Amp (mV) Norm O-P Amp Site1 Site2 Delta-0 (ms) Dist (cm) Vel (m/s) Norm Vel (m/s)  Left Median Motor (Abd Poll Brev)  Wrist    3.8 <4.0 6.9 >5 Elbow Wrist 6.0 32.0 53 >50  Elbow    9.8  6.2         Right Median Motor (Abd Poll Brev)  Wrist    6.8 <4.0 2.2 >5 Elbow Wrist 5.5 31.0 56 >50  Elbow    12.3  2.1         Left Ulnar Motor (Abd Dig Minimi)  Wrist    3.1 <3.1 8.7 >7 B Elbow Wrist 5.2 26.0 50 >50  B Elbow    8.3  8.5  A Elbow B Elbow 2.1 10.0 48 >50  A Elbow    10.4  8.2         Right Ulnar Motor (Abd Dig Minimi)  Wrist    2.5 <3.1 7.1 >7 B Elbow Wrist 5.3 27.0 51 >50  B Elbow    7.8  6.9  A Elbow B Elbow 2.6 10.0 38 >50  A Elbow    10.4  6.7          EMG   Side Muscle Ins Act Fibs Psw Fasc Number Recrt Dur Dur. Amp Amp. Poly Poly. Comment  Right 1stDorInt Nml  Nml Nml Nml Nml Nml Nml Nml Nml Nml Nml Nml N/A  Right Abd Poll Brev Nml Nml Nml Nml 2- Rapid Many 1+ Many 1+ Nml Nml ATR  Right Ext Indicis Nml Nml Nml Nml Nml Nml Nml Nml Nml Nml Nml Nml N/A  Right PronatorTeres Nml Nml Nml Nml Nml Nml Nml Nml Nml Nml Nml Nml N/A  Right Biceps Nml Nml Nml Nml Nml Nml Nml Nml Nml Nml Nml Nml N/A  Right Triceps Nml Nml Nml Nml Nml Nml Nml Nml Nml Nml Nml Nml N/A  Right Deltoid Nml Nml Nml Nml Nml Nml Nml Nml Nml Nml Nml Nml N/A  Right ABD Dig Min Nml Nml Nml Nml Nml Nml Nml Nml Nml Nml Nml Nml N/A  Right FlexCarpiUln Nml Nml Nml Nml Nml Nml Nml Nml Nml Nml Nml Nml N/A      Waveforms:

## 2017-08-15 ENCOUNTER — Telehealth: Payer: Self-pay | Admitting: Neurology

## 2017-08-15 MED ORDER — GABAPENTIN 100 MG PO CAPS: 100.0000 mg | ORAL_CAPSULE | Freq: Three times a day (TID) | ORAL | 3 refills | Status: DC

## 2017-08-15 NOTE — Telephone Encounter (Signed)
Patient would like to go back on the gabapentin.  He has not picked up the cymbalta yet.

## 2017-08-15 NOTE — Telephone Encounter (Signed)
OK to stop Lyrica.  New Rx sent for gabapentin 100mg  three times daily.  We will start low dose and increase as tolerable, since he developed tremors in the past.    Brealynn Contino K. Posey Pronto, DO

## 2017-08-15 NOTE — Telephone Encounter (Signed)
Attempted to contact patient.  No answer and no voicemail. 

## 2017-08-15 NOTE — Telephone Encounter (Signed)
Patient called regarding going back on the Gabapentin and other medication. Please Call. Thanks

## 2017-08-15 NOTE — Telephone Encounter (Signed)
Pt left a VM message wanting a call back, he said it was very important

## 2017-08-15 NOTE — Telephone Encounter (Signed)
Patient given instructions and agreed with plan.  

## 2017-08-21 ENCOUNTER — Ambulatory Visit (INDEPENDENT_AMBULATORY_CARE_PROVIDER_SITE_OTHER): Payer: Medicare Other | Admitting: Orthopedic Surgery

## 2017-08-21 ENCOUNTER — Encounter (INDEPENDENT_AMBULATORY_CARE_PROVIDER_SITE_OTHER): Payer: Self-pay | Admitting: Orthopedic Surgery

## 2017-08-21 VITALS — Ht 73.0 in | Wt 190.0 lb

## 2017-08-21 DIAGNOSIS — L97421 Non-pressure chronic ulcer of left heel and midfoot limited to breakdown of skin: Secondary | ICD-10-CM | POA: Diagnosis not present

## 2017-08-21 DIAGNOSIS — Z89432 Acquired absence of left foot: Secondary | ICD-10-CM

## 2017-08-21 NOTE — Progress Notes (Signed)
Office Visit Note   Patient: [name]           Date of Birth: 08/07/1940           MRN: [id number] Visit Date: 08/21/2017              Requested by: No referring provider defined for this encounter. PCP: System, Pcp Not In  Chief Complaint  Patient presents with  . Left Foot - Wound Check, Follow-up      HPI: Patient presents in follow-up for ulceration left foot plantar aspect status post transmetatarsal amputation.  Patient complains of odor and drainage.  Patient is walking with a walking stick.  Assessment & Plan: Visit Diagnoses:  1. Midfoot ulceration, left, limited to breakdown of skin ([place])   2. Status post transmetatarsal amputation of left foot ([place])     Plan: Recommended wound care with Dial soap cleansing antibiotic ointment gauze dressing change daily.  Reevaluate in 3 weeks.  Follow-Up Instructions: Return in about 3 weeks (around 09/11/2017).   Ortho Exam  Patient is alert, oriented, no adenopathy, well-dressed, normal affect, normal respiratory effort. Examination patient has massive nonviable tissue around the plantar ulcer left transmetatarsal amputation.  Patient is full weightbearing using a walking stick.  Examination he has a slight antalgic gait after informed consent a 10 blade knife was used to debride the skin and soft tissue back to bleeding viable granulation tissue this was touched with silver nitrate the ulcer is 2 cm x 4 cm and 3 mm deep.  There is good granulation tissue there is no purulence no abscess no cellulitis no exposed bone or tendon.  Imaging: No results found. No images are attached to the encounter.  Labs: Lab Results  Component Value Date   HGBA1C 8.1 (H) 08/23/2014   HGBA1C 11.3 (H) 10/12/2012   HGBA1C greater than 14 02/23/2008   ESRSEDRATE 75 (H) 07/28/2017   ESRSEDRATE 76 (H) 09/06/2014   ESRSEDRATE 28 (H) 08/23/2014   CRP 1.8 07/28/2017   CRP 15.8 (H) 09/06/2014   CRP 2.3 (H) 08/23/2014   REPTSTATUS 10/25/2013  FINAL 10/24/2013   CULT NO GROWTH Performed at [facility] 10/24/2013    @LABSALLVALUES (HGBA1)@  Body mass index is 25.07 kg/m.  Orders:  No orders of the defined types were placed in this encounter.  No orders of the defined types were placed in this encounter.    Procedures: No procedures performed  Clinical Data: No additional findings.  ROS:  All other systems negative, except as noted in the HPI. Review of Systems  Objective: Vital Signs: Ht 6\' 1"  (1.854 m)   Wt 190 lb (86.2 kg)   BMI 25.07 kg/m   Specialty Comments:  No specialty comments available.  PMFS History: Patient Active Problem List   Diagnosis Date Noted  . Carpal tunnel syndrome, right upper limb 07/01/2017  . Arthritis of carpometacarpal [facility]) joint of left thumb 12/30/2016  . Midfoot ulceration, left, limited to breakdown of skin ([place]) 07/22/2016  . 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[place]) 09/23/2014  . Osteomyelitis of ankle or foot, left, acute ([place]) 08/23/2014  . Cellulitis 10/12/2012  . Chest pain 10/12/2012  . Tobacco abuse 10/12/2012  . ABSCESS, AXILLA, LEFT 02/23/2008  . 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 CHEST WALL PAIN, ACUTE 12/03/2007  . CANDIDIASIS, GLANS PENIS 09/16/2007  . HEADACHE 08/04/2007  . 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 ([place]) 03/05/2007  . Other and unspecified  hyperlipidemia 03/05/2007  . GOUT 03/05/2007  . ERECTILE DYSFUNCTION 03/05/2007  . EXTERNAL HEMORRHOIDS 03/05/2007  . VENTRAL HERNIA 03/05/2007  . FATTY LIVER DISEASE 03/05/2007  . Osteoarthritis 03/05/2007  . HEMORRHOIDS, INTERNAL 03/17/2005  . COLONIC POLYPS, HX OF 03/17/2005   Past Medical History:  Diagnosis Date  . Arthritis   . High cholesterol   . Hypertension   . Osteomyelitis (HCC)    Right great toe  . Peripheral vascular disease ([place])    diabetic  with osteomylitis  . Type II diabetes mellitus (HCC)       Family History  Problem Relation Age of Onset  . Diabetes Mellitus II Other   . Anesthesia problems Neg Hx     Past Surgical History:  Procedure Laterality Date  . AMPUTATION  07/23/2011   Procedure: AMPUTATION DIGIT;  Surgeon: Newt Minion, MD;  Location: [place];  Service: Orthopedics;  Laterality: Right;  Right Great Toe Amputation  . AMPUTATION Right 09/18/2012   Procedure: Right Foot 2nd Ray Amputation;  Surgeon: Newt Minion, MD;  Location: [place];  Service: Orthopedics;  Laterality: Right;  Right Foot 2nd Ray Amputation  . AMPUTATION Right 10/14/2012   Procedure: AMPUTATION FOOT;  Surgeon: Newt Minion, MD;  Location: [place];  Service: Orthopedics;  Laterality: Right;  Right Foot Transmetatarsal Amputation  . AMPUTATION Left 09/23/2014   Procedure: Left Foot Transmetatarsal Amputation;  Surgeon: Newt Minion, MD;  Location: [place];  Service: Orthopedics;  Laterality: Left;  . BACK SURGERY     lower  . CIRCUMCISION  2010  . FOOT FRACTURE SURGERY Left 1970's   "broke it playing football" (10/12/2012)  . HUMERUS FRACTURE SURGERY W/ IMPLANT Right 1960's   "put a plate in it" (9/51/8841)  . INCISION AND DRAINAGE OF WOUND Left 2006   "foot" (10/12/2012)  . LUMBAR [place] SURGERY  2008   Social History   Occupational History  . Not on file  Tobacco Use  . Smoking status: Current Every Day Smoker    Years: 12.00    Types: Pipe  . Smokeless tobacco: Never Used  . Tobacco comment: 10/12/2012 offered smoking cessation materials; pt declines  Substance and Sexual Activity  . Alcohol use: No    Alcohol/week: 0.0 oz  . Drug use: No  . Sexual activity: Not Currently

## 2017-08-26 ENCOUNTER — Telehealth: Payer: Self-pay | Admitting: Neurology

## 2017-08-26 NOTE — Telephone Encounter (Signed)
Ok to increase to 200mg  three times daily (2 tablets).   Please send new prescription, if needed.

## 2017-08-26 NOTE — Telephone Encounter (Signed)
Would you like to increase?

## 2017-08-26 NOTE — Telephone Encounter (Signed)
Pt called and said the gabapentin 100mg  is not working , wants something stronger

## 2017-08-26 NOTE — Telephone Encounter (Signed)
Patient given instructions.  He will let me know when he needs a refill.

## 2017-08-27 ENCOUNTER — Telehealth (INDEPENDENT_AMBULATORY_CARE_PROVIDER_SITE_OTHER): Payer: Self-pay | Admitting: Orthopedic Surgery

## 2017-08-27 NOTE — Telephone Encounter (Signed)
Med refill  ([place]    Pt needs a prescription for neuropathy in right hand. Pt stated he is having a lot of pain and would like a call from [place] to let him know if he can have a med for pain.

## 2017-08-27 NOTE — Telephone Encounter (Signed)
Called pt and he states that he is taking Cymbalta 30 mg a day and Neurontin 100mg  TID and there is no help with his pain in his hand. He states that he needs something to "knock this out"

## 2017-08-28 ENCOUNTER — Other Ambulatory Visit (INDEPENDENT_AMBULATORY_CARE_PROVIDER_SITE_OTHER): Payer: Self-pay

## 2017-08-28 DIAGNOSIS — M79641 Pain in right hand: Secondary | ICD-10-CM

## 2017-08-28 NOTE — Telephone Encounter (Signed)
Schedule patient for nerve conduction studies with Dr. Ernestina Patches.  Depending on the results of the nerve conduction studies we could proceed with carpal tunnel surgery.

## 2017-08-28 NOTE — Telephone Encounter (Signed)
I called and advised pt of the referral for NCS. Pt voiced understanding and is pleased with the plan. Will call with questions

## 2017-09-10 ENCOUNTER — Telehealth (INDEPENDENT_AMBULATORY_CARE_PROVIDER_SITE_OTHER): Payer: Self-pay | Admitting: Orthopedic Surgery

## 2017-09-10 NOTE — Telephone Encounter (Signed)
Will print out results for NCS for review tomorrow and cx appt with FN

## 2017-09-11 ENCOUNTER — Encounter (INDEPENDENT_AMBULATORY_CARE_PROVIDER_SITE_OTHER): Payer: Self-pay | Admitting: Orthopedic Surgery

## 2017-09-11 ENCOUNTER — Ambulatory Visit (INDEPENDENT_AMBULATORY_CARE_PROVIDER_SITE_OTHER): Payer: Medicare Other | Admitting: Orthopedic Surgery

## 2017-09-11 VITALS — Ht 73.0 in | Wt 190.0 lb

## 2017-09-11 DIAGNOSIS — G5601 Carpal tunnel syndrome, right upper limb: Secondary | ICD-10-CM | POA: Diagnosis not present

## 2017-09-11 DIAGNOSIS — M79641 Pain in right hand: Secondary | ICD-10-CM | POA: Diagnosis not present

## 2017-09-11 DIAGNOSIS — L97421 Non-pressure chronic ulcer of left heel and midfoot limited to breakdown of skin: Secondary | ICD-10-CM | POA: Diagnosis not present

## 2017-09-11 NOTE — Progress Notes (Signed)
Office Visit Note   Patient: [name]           Date of Birth: 06/26/1940           MRN: [id number] Visit Date: 09/11/2017              Requested by: No referring provider defined for this encounter. PCP: System, Pcp Not In  Chief Complaint  Patient presents with  . Left Foot - Follow-up    Hx transmet and ulcer on foot.   . Right Wrist - Pain    S/p NCS 08/14/17      HPI: Patient is a 77 year old gentleman who presents in follow-up for a ulcer plantar aspect left foot status post transmetatarsal amputation as well as carpal tunnel symptoms on the right upper extremity.  Patient states his pain is worse at night this wakes him up at night he has tried bracing without relief.  Assessment & Plan: Visit Diagnoses:  1. Pain in right hand   2. Midfoot ulceration, left, limited to breakdown of skin (HCC)   3. Carpal tunnel syndrome, right upper limb     Plan: Will schedule patient for outpatient surgery for release of carpal tunnel on the right.  Discussed that with his peripheral sensory polyneuropathy that he may get some relief with a carpal tunnel release but he is still going to have pain from his neuropathy.  Discussed there is risk and benefits of surgery including risk of the wound not healing risk of injury to the median nerve.  Patient will follow-up for his right foot continue wound care.  Follow-Up Instructions: Return in about 1 month (around 10/09/2017).   Ortho Exam  Patient is alert, oriented, no adenopathy, well-dressed, normal affect, normal respiratory effort. Examination patient has an antalgic gait he has a ulcer on the midfoot of the left foot transmetatarsal amputation.  After informed consent a 10 blade knife was used to debride the skin and soft tissue back to bleeding viable granulation tissue this was touched with silver nitrate the ulcer is 15 mm in diameter 3 mm deep.  A Band-Aid was applied.  There is no redness no cellulitis no drainage no odor no  signs of infection.  Semination the right wrist he is tender to palpation over the transverse carpal ligament flexion reproduces pain.  Review of his nerve conduction start study shows bilateral median and radial sensory polyneuropathy as well as carpal tunnel symptoms with electric diagnostic decrease in nerve conduction described as severe.  Imaging: No results found. No images are attached to the encounter.  Labs: Lab Results  Component Value Date   HGBA1C 8.1 (H) 08/23/2014   HGBA1C 11.3 (H) 10/12/2012   HGBA1C greater than 14 02/23/2008   ESRSEDRATE 75 (H) 07/28/2017   ESRSEDRATE 76 (H) 09/06/2014   ESRSEDRATE 28 (H) 08/23/2014   CRP 1.8 07/28/2017   CRP 15.8 (H) 09/06/2014   CRP 2.3 (H) 08/23/2014   REPTSTATUS 10/25/2013 FINAL 10/24/2013   CULT NO GROWTH Performed at [facility] 10/24/2013    @LABSALLVALUES (HGBA1)@  Body mass index is 25.07 kg/m.  Orders:  No orders of the defined types were placed in this encounter.  No orders of the defined types were placed in this encounter.    Procedures: No procedures performed  Clinical Data: No additional findings.  ROS:  All other systems negative, except as noted in the HPI. Review of Systems  Objective: Vital Signs: Ht 6\' 1"  (1.854 m)   Wt 190 lb (  86.2 kg)   BMI 25.07 kg/m   Specialty Comments:  No specialty comments available.  PMFS History: Patient Active Problem List   Diagnosis Date Noted  . Pain in right hand 09/11/2017  . Carpal tunnel syndrome, right upper limb 07/01/2017  . Arthritis of carpometacarpal [facility]) joint of left thumb 12/30/2016  . Midfoot ulceration, left, limited to breakdown of skin ([place]) 07/22/2016  . 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[place]) 09/23/2014  . Osteomyelitis of ankle or foot, left, acute ([place]) 08/23/2014  . Cellulitis 10/12/2012  . Chest pain 10/12/2012  . Tobacco abuse 10/12/2012  . ABSCESS, AXILLA, LEFT 02/23/2008  . 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    . CHEST WALL PAIN, ACUTE 12/03/2007  . CANDIDIASIS, GLANS PENIS 09/16/2007  . HEADACHE 08/04/2007  . 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 Other and unspecified hyperlipidemia 03/05/2007  . GOUT 03/05/2007  . ERECTILE DYSFUNCTION 03/05/2007  . EXTERNAL HEMORRHOIDS 03/05/2007  . VENTRAL HERNIA 03/05/2007  . FATTY LIVER DISEASE 03/05/2007  . Osteoarthritis 03/05/2007  . HEMORRHOIDS, INTERNAL 03/17/2005  . COLONIC POLYPS, HX OF 03/17/2005   Past Medical History:  Diagnosis Date  . Arthritis   . High cholesterol   . Hypertension   . Osteomyelitis (HCC)    Right great toe  . Peripheral vascular disease ([place])    diabetic  with osteomylitis  . Type II diabetes mellitus (HCC)     Family History  Problem Relation Age of Onset  . Diabetes Mellitus II Other   . Anesthesia problems Neg Hx     Past Surgical History:  Procedure Laterality Date  . AMPUTATION  07/23/2011   Procedure: AMPUTATION DIGIT;  Surgeon: Newt Minion, MD;  Location: [place];  Service: Orthopedics;  Laterality: Right;  Right Great Toe Amputation  . AMPUTATION Right 09/18/2012   Procedure: Right Foot 2nd Ray Amputation;  Surgeon: Newt Minion, MD;  Location: [place];  Service: Orthopedics;  Laterality: Right;  Right Foot 2nd Ray Amputation  . AMPUTATION Right 10/14/2012   Procedure: AMPUTATION FOOT;  Surgeon: Newt Minion, MD;  Location: [place];  Service: Orthopedics;  Laterality: Right;  Right Foot Transmetatarsal Amputation  . AMPUTATION Left 09/23/2014   Procedure: Left Foot Transmetatarsal Amputation;  Surgeon: Newt Minion, MD;  Location: [place];  Service: Orthopedics;  Laterality: Left;  . BACK SURGERY     lower  . CIRCUMCISION  2010  . FOOT FRACTURE SURGERY Left 1970's   "broke it playing football" (10/12/2012)  . HUMERUS FRACTURE SURGERY W/ IMPLANT Right 1960's   "put a plate in it" (2/33/0076)  . INCISION AND DRAINAGE OF WOUND  Left 2006   "foot" (10/12/2012)  . LUMBAR [place] SURGERY  2008   Social History   Occupational History  . Not on file  Tobacco Use  . Smoking status: Current Every Day Smoker    Years: 12.00    Types: Pipe  . Smokeless tobacco: Never Used  . Tobacco comment: 10/12/2012 offered smoking cessation materials; pt declines  Substance and Sexual Activity  . Alcohol use: No    Alcohol/week: 0.0 oz  . Drug use: No  . Sexual activity: Not Currently

## 2017-09-12 ENCOUNTER — Encounter (INDEPENDENT_AMBULATORY_CARE_PROVIDER_SITE_OTHER): Payer: Self-pay | Admitting: Physical Medicine and Rehabilitation

## 2017-09-25 ENCOUNTER — Other Ambulatory Visit (INDEPENDENT_AMBULATORY_CARE_PROVIDER_SITE_OTHER): Payer: Self-pay | Admitting: Orthopedic Surgery

## 2017-09-25 ENCOUNTER — Telehealth (INDEPENDENT_AMBULATORY_CARE_PROVIDER_SITE_OTHER): Payer: Self-pay | Admitting: Orthopedic Surgery

## 2017-09-25 MED ORDER — HYDROCODONE-ACETAMINOPHEN 5-325 MG PO TABS: 1.0000 | ORAL_TABLET | ORAL | 0 refills | Status: DC | PRN

## 2017-09-25 NOTE — Telephone Encounter (Signed)
rx written

## 2017-09-25 NOTE — Telephone Encounter (Signed)
This has been done and pt is aware.

## 2017-09-25 NOTE — Telephone Encounter (Signed)
Called pt to advise that rx is at the front desk for pick up

## 2017-09-25 NOTE — Telephone Encounter (Signed)
Patient called to stated he needs RX for pain. Not sure what you can call in for him but he was in pain the entire night.  Please call to advise patent

## 2017-09-25 NOTE — Telephone Encounter (Signed)
Pt to be set up for CTS requesting rx for pain. Nothing in the data base for narcotic since 12/2016. Did have lyrica 100mg  in February of this year for another doctor. States that he is unable to sleep due to the pain. Please advise.

## 2017-09-25 NOTE — Telephone Encounter (Signed)
Patient called asked for a call back as soon as the Rx for pain is ready. The number to contact patient is [phone]

## 2017-09-27 ENCOUNTER — Encounter (HOSPITAL_COMMUNITY): Payer: Self-pay | Admitting: Emergency Medicine

## 2017-09-27 ENCOUNTER — Emergency Department (HOSPITAL_COMMUNITY): Payer: Medicare Other

## 2017-09-27 ENCOUNTER — Emergency Department (HOSPITAL_COMMUNITY)
Admission: EM | Admit: 2017-09-27 | Discharge: 2017-09-27 | Disposition: A | Discharge status: Home / Self Care | Payer: Medicare Other | Attending: Emergency Medicine | Admitting: Emergency Medicine

## 2017-09-27 DIAGNOSIS — R21 Rash and other nonspecific skin eruption: Secondary | ICD-10-CM | POA: Insufficient documentation

## 2017-09-27 DIAGNOSIS — I10 Essential (primary) hypertension: Secondary | ICD-10-CM | POA: Insufficient documentation

## 2017-09-27 DIAGNOSIS — F17210 Nicotine dependence, cigarettes, uncomplicated: Secondary | ICD-10-CM | POA: Insufficient documentation

## 2017-09-27 DIAGNOSIS — R112 Nausea with vomiting, unspecified: Secondary | ICD-10-CM | POA: Insufficient documentation

## 2017-09-27 DIAGNOSIS — L97528 Non-pressure chronic ulcer of other part of left foot with other specified severity: Secondary | ICD-10-CM | POA: Diagnosis not present

## 2017-09-27 DIAGNOSIS — H409 Unspecified glaucoma: Secondary | ICD-10-CM | POA: Insufficient documentation

## 2017-09-27 DIAGNOSIS — H538 Other visual disturbances: Secondary | ICD-10-CM | POA: Diagnosis not present

## 2017-09-27 DIAGNOSIS — Z7984 Long term (current) use of oral hypoglycemic drugs: Secondary | ICD-10-CM | POA: Diagnosis not present

## 2017-09-27 DIAGNOSIS — E08621 Diabetes mellitus due to underlying condition with foot ulcer: Secondary | ICD-10-CM | POA: Insufficient documentation

## 2017-09-27 DIAGNOSIS — Z89421 Acquired absence of other right toe(s): Secondary | ICD-10-CM | POA: Diagnosis not present

## 2017-09-27 DIAGNOSIS — Z89422 Acquired absence of other left toe(s): Secondary | ICD-10-CM | POA: Insufficient documentation

## 2017-09-27 DIAGNOSIS — Z79899 Other long term (current) drug therapy: Secondary | ICD-10-CM | POA: Diagnosis not present

## 2017-09-27 DIAGNOSIS — M86172 Other acute osteomyelitis, left ankle and foot: Secondary | ICD-10-CM | POA: Insufficient documentation

## 2017-09-27 DIAGNOSIS — R110 Nausea: Secondary | ICD-10-CM

## 2017-09-27 LAB — COMPREHENSIVE METABOLIC PANEL
ALT: 13 U/L — ABNORMAL LOW (ref 17–63)
AST: 14 U/L — ABNORMAL LOW (ref 15–41)
Albumin: 3.9 g/dL (ref 3.5–5.0)
Alkaline Phosphatase: 52 U/L (ref 38–126)
Anion gap: 12 (ref 5–15)
BUN: 12 mg/dL (ref 6–20)
CO2: 20 mmol/L — ABNORMAL LOW (ref 22–32)
Calcium: 9.4 mg/dL (ref 8.9–10.3)
Chloride: 101 mmol/L (ref 101–111)
Creatinine, Ser: 1.32 mg/dL — ABNORMAL HIGH (ref 0.61–1.24)
GFR calc Af Amer: 60 mL/min — ABNORMAL LOW (ref >60)
GFR calc non Af Amer: 51 mL/min — ABNORMAL LOW (ref >60)
Glucose, Bld: 116 mg/dL — ABNORMAL HIGH (ref 65–99)
Potassium: 4 mmol/L (ref 3.5–5.1)
Sodium: 133 mmol/L — ABNORMAL LOW (ref 135–145)
Total Bilirubin: 0.8 mg/dL (ref 0.3–1.2)
Total Protein: 8.4 g/dL — ABNORMAL HIGH (ref 6.5–8.1)

## 2017-09-27 LAB — LIPASE, BLOOD: Lipase: 22 U/L (ref 11–51)

## 2017-09-27 LAB — CBC
HCT: 40.5 % (ref 39.0–52.0)
Hemoglobin: 13.4 g/dL (ref 13.0–17.0)
MCH: 28.4 pg (ref 26.0–34.0)
MCHC: 33.1 g/dL (ref 30.0–36.0)
MCV: 85.8 fL (ref 78.0–100.0)
Platelets: 275 10*3/uL (ref 150–400)
RBC: 4.72 MIL/uL (ref 4.22–5.81)
RDW: 15.4 % (ref 11.5–15.5)
WBC: 6.6 10*3/uL (ref 4.0–10.5)

## 2017-09-27 LAB — URINALYSIS, ROUTINE W REFLEX MICROSCOPIC
Bacteria, UA: NONE SEEN
Bilirubin Urine: NEGATIVE
Glucose, UA: NEGATIVE mg/dL
Ketones, ur: NEGATIVE mg/dL
Nitrite: NEGATIVE
Protein, ur: NEGATIVE mg/dL
Specific Gravity, Urine: 1.008 (ref 1.005–1.030)
pH: 5 (ref 5.0–8.0)

## 2017-09-27 LAB — I-STAT TROPONIN, ED: Troponin i, poc: 0.01 ng/mL (ref 0.00–0.08)

## 2017-09-27 LAB — SEDIMENTATION RATE: Sed Rate: 45 mm/hr — ABNORMAL HIGH (ref 0–16)

## 2017-09-27 MED ORDER — TETRACAINE HCL 0.5 % OP SOLN
2.0000 [drp] | Freq: Once | OPHTHALMIC | Status: AC
  Administered 2017-09-27: 2 [drp] via OPHTHALMIC
  Filled 2017-09-27: qty 4

## 2017-09-27 NOTE — ED Triage Notes (Signed)
Per PTAR:  Patient presents to ED for assessment of "not feeling well" with nausea.  Denies vomiting or diarrhea.  Denies fevers at home.  Denies pain.  Patient also c/o rash to skin, and blurred vision.  VSS with PTAR

## 2017-09-27 NOTE — ED Notes (Signed)
Pt understood dc material. NAD noted. 

## 2017-09-27 NOTE — ED Notes (Signed)
ED Provider at bedside. 

## 2017-09-27 NOTE — ED Notes (Signed)
Patient also c/o blurred vision x 3-4 weeks, worsening blurriness and some darkening around the periphery of his vision.  Neuro assessment WDL at triage

## 2017-09-27 NOTE — Discharge Instructions (Addendum)
Follow up with your specialist and primary doctor.

## 2017-09-27 NOTE — ED Provider Notes (Signed)
[place] EMERGENCY DEPARTMENT Provider Note   CSN: [id number] Arrival date & time: 09/27/17  1644     History   Chief Complaint Chief Complaint  Patient presents with  . Nausea    HPI [name] is a 77 y.o. male.  Patient with history of osteomyelitis, bilateral partial foot amputation, high blood pressure, vascular disease, cholesterol, cigarette smoker presents with nausea andvomiting and generally not feeling well for the past 2 days. No diarrhea or sick contacts. No new medications. Patient's had dry skin worse on the right arm. No fevers. Patient follows up with the wound care for an ulcer on the bottom of his left foot. No current antibiotics.     Past Medical History:  Diagnosis Date  . Arthritis   . High cholesterol   . Hypertension   . Osteomyelitis (HCC)    Right great toe  . Peripheral vascular disease ([place])    diabetic  with osteomylitis  . Type II diabetes mellitus [facility])     Patient Active Problem List   Diagnosis Date Noted  . Pain in right hand 09/11/2017  . Carpal tunnel syndrome, right upper limb 07/01/2017  . 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 Midfoot ulceration, left, limited to breakdown of skin ([place]) 07/22/2016  . 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[place]) 09/23/2014  . Osteomyelitis of ankle or foot, left, acute ([place]) 08/23/2014  . Cellulitis 10/12/2012  . Chest pain 10/12/2012  . Tobacco abuse 10/12/2012  . ABSCESS, AXILLA, LEFT 02/23/2008  . 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 CHEST WALL PAIN, ACUTE 12/03/2007  . CANDIDIASIS, GLANS PENIS 09/16/2007  . HEADACHE 08/04/2007  . 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 Other and unspecified hyperlipidemia 03/05/2007  . GOUT 03/05/2007  . ERECTILE DYSFUNCTION 03/05/2007  . EXTERNAL HEMORRHOIDS 03/05/2007  . VENTRAL HERNIA 03/05/2007  . FATTY LIVER  DISEASE 03/05/2007  . Osteoarthritis 03/05/2007  . HEMORRHOIDS, INTERNAL 03/17/2005  . COLONIC POLYPS, HX OF 03/17/2005    Past Surgical History:  Procedure Laterality Date  . AMPUTATION  07/23/2011   Procedure: AMPUTATION DIGIT;  Surgeon: Newt Minion, MD;  Location: [place];  Service: Orthopedics;  Laterality: Right;  Right Great Toe Amputation  . AMPUTATION Right 09/18/2012   Procedure: Right Foot 2nd Ray Amputation;  Surgeon: Newt Minion, MD;  Location: [place];  Service: Orthopedics;  Laterality: Right;  Right Foot 2nd Ray Amputation  . AMPUTATION Right 10/14/2012   Procedure: AMPUTATION FOOT;  Surgeon: Newt Minion, MD;  Location: [place];  Service: Orthopedics;  Laterality: Right;  Right Foot Transmetatarsal Amputation  . AMPUTATION Left 09/23/2014   Procedure: Left Foot Transmetatarsal Amputation;  Surgeon: Newt Minion, MD;  Location: [place];  Service: Orthopedics;  Laterality: Left;  . BACK SURGERY     lower  . CIRCUMCISION  2010  . FOOT FRACTURE SURGERY Left 1970's   "broke it playing football" (10/12/2012)  . HUMERUS FRACTURE SURGERY W/ IMPLANT Right 1960's   "put a plate in it" (12/07/6331)  . INCISION AND DRAINAGE OF WOUND Left 2006   "foot" (10/12/2012)  . [place] SURGERY  2008        Home Medications    Prior to Admission medications   Medication Sig Start Date End Date Taking? Authorizing Provider  acetaminophen (TYLENOL) 500 MG tablet Take 1,000 mg by mouth every 6 (six) hours as needed for mild  pain.    [provider]  ALPHAGAN P 0.1 % SOLN Place 1 drop into both eyes 3 (three) times daily.  12/31/16   [provider]  Capsaicin-Menthol-Methyl Sal (CAPSAICIN-METHYL SAL-MENTHOL) 0.025-1-12 % CREA Apply 1 application topically 4 (four) times daily as needed. 05/16/17   Suzan Slick, NP  Cyanocobalamin (VITAMIN B-12 PO) Take 1 tablet by mouth daily. 2000 mcg    [provider]  DULoxetine (CYMBALTA) 30 MG capsule Take 1 capsule (30 mg total)  by mouth daily. 08/14/17   Narda Amber K, DO  gabapentin (NEURONTIN) 100 MG capsule Take 1 capsule (100 mg total) by mouth 3 (three) times daily. 08/15/17   Narda Amber K, DO  HYDROcodone-acetaminophen (NORCO/VICODIN) 5-325 MG tablet Take 1 tablet by mouth every 4 (four) hours as needed for moderate pain. 09/25/17   Newt Minion, MD  latanoprost (XALATAN) 0.005 % ophthalmic solution Place 1 drop into the right eye at bedtime. 09/12/14   [provider]  metFORMIN (GLUCOPHAGE) 1000 MG tablet Take 1,000 mg by mouth daily with breakfast.     [provider]  pyridOXINE (VITAMIN B-6) 100 MG tablet Take 100 mg by mouth daily.    [provider]  silver sulfADIAZINE (SILVADENE) 1 % cream Apply 1 application topically daily. 10/24/16   Suzan Slick, NP  timolol (TIMOPTIC) 0.5 % ophthalmic solution Place 1 drop into both eyes 2 (two) times daily. 05/19/17   [provider]    Family History Family History  Problem Relation Age of Onset  . Diabetes Mellitus II Other   . Anesthesia problems Neg Hx     Social History Social History   Tobacco Use  . Smoking status: Current Every Day Smoker    Years: 12.00    Types: Pipe  . Smokeless tobacco: Never Used  . Tobacco comment: 10/12/2012 offered smoking cessation materials; pt declines  Substance Use Topics  . Alcohol use: No    Alcohol/week: 0.0 oz  . Drug use: No     Allergies   Patient has no known allergies.   Review of Systems Review of Systems  Constitutional: Negative for chills and fever.  HENT: Negative for congestion.   Eyes: Positive for visual disturbance (blurry vision for the past few weeks patient follows upwith an eye doctor outpatient).  Respiratory: Negative for shortness of breath.   Cardiovascular: Negative for chest pain.  Gastrointestinal: Positive for nausea and vomiting. Negative for abdominal pain.  Genitourinary: Negative for dysuria and flank pain.  Musculoskeletal: Negative  for back pain, neck pain and neck stiffness.  Skin: Positive for rash.  Neurological: Negative for weakness, light-headedness, numbness and headaches.     Physical Exam Updated Vital Signs BP (!) 153/83   Pulse (!) 57   Temp 98.8 F (37.1 C) (Oral)   SpO2 100%   Physical Exam  Constitutional: He is oriented to person, place, and time. He appears well-developed and well-nourished.  HENT:  Head: Normocephalic and atraumatic.  Eyes: Conjunctivae are normal. Right eye exhibits no discharge. Left eye exhibits no discharge.  Neck: Normal range of motion. Neck supple. No tracheal deviation present.  Cardiovascular: Normal rate and regular rhythm.  Pulmonary/Chest: Effort normal and breath sounds normal.  Abdominal: Soft. He exhibits no distension. There is no tenderness. There is no guarding.  Musculoskeletal: He exhibits no edema.  Neurological: He is alert and oriented to person, place, and time. GCS eye subscore is 4. GCS verbal subscore is 5. GCS motor  subscore is 6.  Patient has decreased vision bilateral. Pupils equal and reactive. No focal weakness upper or lower extremities.  Skin: Skin is warm. No rash noted.  Patient has 2 cm open skin ulcer plantar surface proximal left foot. No surrounding erythema or warmth and no induration. Mild odor.  Psychiatric: He has a normal mood and affect.  Nursing note and vitals reviewed.    ED Treatments / Results  Labs (all labs ordered are listed, but only abnormal results are displayed) Labs Reviewed  COMPREHENSIVE METABOLIC PANEL - Abnormal; Notable for the following components:      Result Value   Sodium 133 (*)    CO2 20 (*)    Glucose, Bld 116 (*)    Creatinine, Ser 1.32 (*)    Total Protein 8.4 (*)    AST 14 (*)    ALT 13 (*)    GFR calc non Af Amer 51 (*)    GFR calc Af Amer 60 (*)    All other components within normal limits  URINALYSIS, ROUTINE W REFLEX MICROSCOPIC - Abnormal; Notable for the following components:    APPearance HAZY (*)    Hgb urine dipstick SMALL (*)    Leukocytes, UA SMALL (*)    Squamous Epithelial / LPF 0-5 (*)    All other components within normal limits  SEDIMENTATION RATE - Abnormal; Notable for the following components:   Sed Rate 45 (*)    All other components within normal limits  LIPASE, BLOOD  CBC  I-STAT TROPONIN, ED    EKG EKG Interpretation  Date/Time:  Saturday September 27 2017 16:58:00 EDT Ventricular Rate:  82 PR Interval:  184 QRS Duration: 122 QT Interval:  426 QTC Calculation: 497 R Axis:   -63 Text Interpretation:  Normal sinus rhythm Right atrial enlargement Left axis deviation Right bundle branch block Abnormal ECG Similar previous Confirmed by Elnora Morrison [id number]) on 09/27/2017 6:38:01 PM   Radiology Dg Foot 2 Views Left  Result Date: 09/27/2017 CLINICAL DATA:  Plantar soft tissue ulcer EXAM: LEFT FOOT - 2 VIEW COMPARISON:  Left foot radiographs June 24, 2014 and left foot MRI August 12, 2014 FINDINGS: Frontal and lateral views were obtained. Patient has had amputation set the proximal to mid metatarsal levels throughout the foot. There is soft tissue ulceration volar to the proximal metatarsals. There is no erosive change or bony destruction. There is bony bridging between the proximal second, third, fourth, and fifth metatarsals. There is extensive osteoarthritis with bony fusion throughout the midfoot and hindfoot regions. There is extensive arthropathy in the ankle region. There is pes planus. There are calcaneal spurs posteriorly and inferiorly. There is extensive arterial vascular calcification. IMPRESSION: 1. Status post amputations at all proximal to mid metatarsal levels. 2.  Soft tissue ulceration volar to the proximal metatarsals. 3.  No bony destruction. 4. 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 There is narrowing but non fusion of the tarsal-metatarsal joints as well as the medial cuneiform joint. No erosive changes. 5.   Advanced arthropathy in the ankle joint region. 6.  Calcaneal spurs present. 7.  Pes planus. 8. Extensive arterial vascular calcification consistent with atherosclerosis and diabetes mellitus. Electronically Signed   By: Lowella Grip III M.D.   On: 09/27/2017 19:33    Procedures Procedures (including critical care time)  Medications Ordered in ED Medications  tetracaine (PONTOCAINE) 0.5 % ophthalmic solution 2 drop (2 drops Right Eye Given by Other 09/27/17 2019)  Initial Impression / Assessment and Plan / ED Course  I have reviewed the triage vital signs and the nursing notes.  Pertinent labs & imaging results that were available during my care of the patient were reviewed by me and considered in my medical decision making (see chart for details).    Patient presents with nausea and mild vomiting. Discussed broad differential diagnosis - Concern for possible relation to diabetic foot ulcer.  Initial blood work reassuring normal white blood count no fever. Added sedimentation rate and x-ray.    Patient has been following with ophthalmologist for mild glaucoma and blurry vision the past 3-4 weeks. Patient does have follow up outpatient. No focal neuro deficits. Checked pressures 19 and 14.  Patient given a sandwich tolerated oral. Patient needs close follow up outpatient.  Left foot ulcer x-ray reviewed no definitive involvement of the bones sedimentation rate pending.  Results and differential diagnosis were discussed with the patient/parent/guardian. Xrays were independently reviewed by myself.  Close follow up outpatient was discussed, comfortable with the plan.   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

## 2017-10-01 ENCOUNTER — Encounter (INDEPENDENT_AMBULATORY_CARE_PROVIDER_SITE_OTHER): Payer: Self-pay | Admitting: Physical Medicine and Rehabilitation

## 2017-10-06 ENCOUNTER — Other Ambulatory Visit (INDEPENDENT_AMBULATORY_CARE_PROVIDER_SITE_OTHER): Payer: Self-pay | Admitting: Orthopedic Surgery

## 2017-10-06 DIAGNOSIS — G5601 Carpal tunnel syndrome, right upper limb: Secondary | ICD-10-CM

## 2017-10-07 NOTE — Pre-Procedure Instructions (Signed)
[name]  10/07/2017      [place], [place] - [address] [place] [address] Phone: [phone] Fax: [place], [place], [place] [address] Phone: [phone] Fax: [phone]    Your procedure is scheduled on May 1  Report to [facility].M.  Call this number if you have problems the morning of surgery:  [phone]   Remember:  Do not eat food or drink liquids after midnight.  Continue all medications as directed by your physician except follow these medication instructions before surgery below   Take these medicines the morning of surgery with A SIP OF WATER  acetaminophen (TYLENOL)   gabapentin (NEURONTIN)  HYDROcodone-acetaminophen (NORCO/VICODIN)  Eye drops if needed  7 days prior to surgery STOP taking any Aspirin(unless otherwise instructed by your surgeon), Aleve, Naproxen, Ibuprofen, Motrin, Advil, Goody's, BC's, all herbal medications, fish oil, and all vitamins   WHAT DO I DO ABOUT MY DIABETES MEDICATION?   Marland Kitchen Do not take oral diabetes medicines (pills) the morning of surgery.  metFORMIN (GLUCOPHAGE)    How to Manage Your Diabetes Before and After Surgery  Why is it important to control my blood sugar before and after surgery? . Improving blood sugar levels before and after surgery helps healing and can limit problems. . A way of improving blood sugar control is eating a healthy diet by: o  Eating less sugar and carbohydrates o  Increasing activity/exercise o  Talking with your doctor about reaching your blood sugar goals . High blood sugars (greater than 180 mg/dL) can raise your risk of infections and slow your recovery, so you will need to focus on controlling your diabetes during the weeks before surgery. . Make sure that the doctor who takes care of  your diabetes knows about your planned surgery including the date and location.  How do I manage my blood sugar before surgery? . Check your blood sugar at least 4 times a day, starting 2 days before surgery, to make sure that the level is not too high or low. o Check your blood sugar the morning of your surgery when you wake up and every 2 hours until you get to the Short Stay unit. . If your blood sugar is less than 70 mg/dL, you will need to treat for low blood sugar: o Do not take insulin. o Treat a low blood sugar (less than 70 mg/dL) with  cup of clear juice (cranberry or apple), 4 glucose tablets, OR glucose gel. o Recheck blood sugar in 15 minutes after treatment (to make sure it is greater than 70 mg/dL). If your blood sugar is not greater than 70 mg/dL on recheck, call [phone] for further instructions. . Report your blood sugar to the short stay nurse when you get to Short Stay.  . If you are admitted to the hospital after surgery: o Your blood sugar will be checked by the staff and you will probably be given insulin after surgery (instead of oral diabetes medicines) to make sure you have good blood sugar levels. o The goal for blood sugar control after surgery is 80-180 mg/dL.    Do not wear jewelry  Do not wear lotions, powders, or cologne, or deodorant.  Men may shave face and neck.  Do not bring valuables to the hospital.  [facility]  is not responsible for any belongings or valuables.  Contacts, dentures or bridgework may not be worn into surgery.  Leave your suitcase in the car.  After surgery it may be brought to your room.  For patients admitted to the hospital, discharge time will be determined by your treatment team.  Patients discharged the day of surgery will not be allowed to drive home.    Special instructions:   [facility]- Preparing For Surgery  Before surgery, you can play an important role. Because skin is not sterile, your skin needs to be as free  of germs as possible. You can reduce the number of germs on your skin by washing with CHG (chlorahexidine gluconate) Soap before surgery.  CHG is an antiseptic cleaner which kills germs and bonds with the skin to continue killing germs even after washing.  Please do not use if you have an allergy to CHG or antibacterial soaps. If your skin becomes reddened/irritated stop using the CHG.  Do not shave (including legs and underarms) for at least 48 hours prior to first CHG shower. It is OK to shave your face.  Please follow these instructions carefully.   1. Shower the NIGHT BEFORE SURGERY and the MORNING OF SURGERY with CHG.   2. If you chose to wash your hair, wash your hair first as usual with your normal shampoo.  3. After you shampoo, rinse your hair and body thoroughly to remove the shampoo.  4. Use CHG as you would any other liquid soap. You can apply CHG directly to the skin and wash gently with a scrungie or a clean washcloth.   5. Apply the CHG Soap to your body ONLY FROM THE NECK DOWN.  Do not use on open wounds or open sores. Avoid contact with your eyes, ears, mouth and genitals (private parts). Wash Face and genitals (private parts)  with your normal soap.  6. Wash thoroughly, paying special attention to the area where your surgery will be performed.  7. Thoroughly rinse your body with warm water from the neck down.  8. DO NOT shower/wash with your normal soap after using and rinsing off the CHG Soap.  9. Pat yourself dry with a CLEAN TOWEL.  10. Wear CLEAN PAJAMAS to bed the night before surgery, wear comfortable clothes the morning of surgery  11. Place CLEAN SHEETS on your bed the night of your first shower and DO NOT SLEEP WITH PETS.    Day of Surgery: Do not apply any deodorants/lotions. Please wear clean clothes to the hospital/surgery center.      Please read over the following fact sheets that you were given.

## 2017-10-08 ENCOUNTER — Inpatient Hospital Stay (HOSPITAL_COMMUNITY)
Admission: RE | Admit: 2017-10-08 | Discharge: 2017-10-08 | Disposition: A | Discharge status: Home / Self Care | Payer: Medicare Other | Source: Ambulatory Visit

## 2017-10-13 ENCOUNTER — Telehealth (INDEPENDENT_AMBULATORY_CARE_PROVIDER_SITE_OTHER): Payer: Self-pay | Admitting: Orthopedic Surgery

## 2017-10-13 NOTE — Telephone Encounter (Signed)
Patient left message on voice mail to call him back because it was very important. I returned the call.  He would like to reschedule his surgery for this 09-21-22 because of a death in the family.    cb  [phone]

## 2017-10-14 NOTE — Telephone Encounter (Signed)
I spoke with Mr. Schedler.  Advised that I cancelled surgery that was originally scheduled for tomorrow 10/15/2017. He would like to rescheduled for the next 1st available appointment.

## 2017-10-16 ENCOUNTER — Ambulatory Visit (INDEPENDENT_AMBULATORY_CARE_PROVIDER_SITE_OTHER): Payer: Medicare Other | Admitting: Orthopedic Surgery

## 2017-10-23 ENCOUNTER — Inpatient Hospital Stay (INDEPENDENT_AMBULATORY_CARE_PROVIDER_SITE_OTHER): Payer: Medicare Other | Admitting: Orthopedic Surgery

## 2017-10-28 ENCOUNTER — Encounter (HOSPITAL_COMMUNITY): Payer: Self-pay | Admitting: *Deleted

## 2017-10-28 NOTE — Progress Notes (Signed)
Spoke with pt to do a pre-op call. Pt states that he can't have surgery tomorrow because he has to get his eyes checked out. He stated that no one had told him that he was having surgery tomorrow. Called Dr. Jess Barters office and spoke with Malachy Mood letting her know that pt wasn't having surgery tomorrow.

## 2017-10-29 ENCOUNTER — Ambulatory Visit (HOSPITAL_COMMUNITY): Admission: RE | Admit: 2017-10-29 | Payer: Medicare Other | Source: Ambulatory Visit | Admitting: Orthopedic Surgery

## 2017-10-29 ENCOUNTER — Encounter (HOSPITAL_COMMUNITY): Admission: RE | Payer: Self-pay | Source: Ambulatory Visit

## 2017-10-29 SURGERY — CARPAL TUNNEL RELEASE
Anesthesia: Monitor Anesthesia Care | Laterality: Right

## 2017-11-13 ENCOUNTER — Encounter (INDEPENDENT_AMBULATORY_CARE_PROVIDER_SITE_OTHER): Payer: Self-pay | Admitting: Orthopedic Surgery

## 2017-11-13 ENCOUNTER — Ambulatory Visit (INDEPENDENT_AMBULATORY_CARE_PROVIDER_SITE_OTHER): Payer: Medicare Other | Admitting: Orthopedic Surgery

## 2017-11-13 VITALS — Ht 73.0 in | Wt 190.0 lb

## 2017-11-13 DIAGNOSIS — I70209 Unspecified atherosclerosis of native arteries of extremities, unspecified extremity: Secondary | ICD-10-CM | POA: Diagnosis not present

## 2017-11-13 DIAGNOSIS — Z89432 Acquired absence of left foot: Secondary | ICD-10-CM | POA: Diagnosis not present

## 2017-11-13 DIAGNOSIS — E1151 Type 2 diabetes mellitus with diabetic peripheral angiopathy without gangrene: Secondary | ICD-10-CM

## 2017-11-13 DIAGNOSIS — L97421 Non-pressure chronic ulcer of left heel and midfoot limited to breakdown of skin: Secondary | ICD-10-CM

## 2017-11-13 NOTE — Progress Notes (Signed)
Office Visit Note   Patient: [name]           Date of Birth: 01-20-1941           MRN: [id number] Visit Date: 11/13/2017              Requested by: [name], MD [address] [place], [place] [address] PCP: [name], MD  Chief Complaint  Patient presents with  . Left Foot - Open Wound    Hx transmet amputation       HPI: Patient presents stating he has a new ulcer on the plantar aspect the left foot that has odor and drainage.  Assessment & Plan: Visit Diagnoses:  1. Midfoot ulceration, left, limited to breakdown of skin ([place])   2. Status post transmetatarsal amputation of left foot ([place])   3. 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  Follow-Up Instructions: Return in about 3 weeks (around 12/04/2017).   Ortho Exam  Patient is alert, oriented, no adenopathy, well-dressed, normal affect, normal respiratory effort. Examination patient has a large Waggoner grade 1 ulcer on the plantar aspect of the left foot the transmetatarsal amputation is well-healed there is no redness no cellulitis there is odor.  After informed consent a 10 blade knife was used to debride the skin and soft tissue back to healthy viable granulation tissue this was touched with silver nitrate.  The ulcer is 2 x 4 cm and 3 mm deep.  There is no exposed bone or tendon.  Iodosorb 4 x 4 gauze and tape was applied.  Imaging: No results found. No images are attached to the encounter.  Labs: Lab Results  Component Value Date   HGBA1C 8.1 (H) 08/23/2014   HGBA1C 11.3 (H) 10/12/2012   HGBA1C greater than 14 02/23/2008   ESRSEDRATE 45 (H) 09/27/2017   ESRSEDRATE 75 (H) 07/28/2017   ESRSEDRATE 76 (H) 09/06/2014   CRP 1.8 07/28/2017   CRP 15.8 (H)  09/06/2014   CRP 2.3 (H) 08/23/2014   REPTSTATUS 10/25/2013 FINAL 10/24/2013   CULT NO GROWTH Performed at [facility] 10/24/2013     Lab Results  Component Value Date   ALBUMIN 3.9 09/27/2017   ALBUMIN 3.7 06/24/2017   ALBUMIN 3.4 (L) 09/23/2014    Body mass index is 25.07 kg/m.  Orders:  No orders of the defined types were placed in this encounter.  No orders of the defined types were placed in this encounter.    Procedures: No procedures performed  Clinical Data: No additional findings.  ROS:  All other systems negative, except as noted in the HPI. Review of Systems  Objective: Vital Signs: Ht 6\' 1"  (1.854 m)   Wt 190 lb (86.2 kg)   BMI 25.07 kg/m   Specialty Comments:  No specialty comments available.  PMFS History: Patient Active Problem List   Diagnosis Date Noted  . Pain in right hand 09/11/2017  . Carpal tunnel syndrome, right upper limb 07/01/2017  . 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 Midfoot ulceration, left, limited to breakdown of skin ([place]) 07/22/2016  . 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[place]) 09/23/2014  . Osteomyelitis of ankle or foot, left, acute ([place]) 08/23/2014  . Cellulitis 10/12/2012  . Chest pain 10/12/2012  . Tobacco  abuse 10/12/2012  . ABSCESS, AXILLA, LEFT 02/23/2008  . 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 CHEST WALL PAIN, ACUTE 12/03/2007  . CANDIDIASIS, GLANS PENIS 09/16/2007  . HEADACHE 08/04/2007  . 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 Other and unspecified hyperlipidemia 03/05/2007  . GOUT 03/05/2007  . ERECTILE DYSFUNCTION 03/05/2007  . EXTERNAL HEMORRHOIDS 03/05/2007  . VENTRAL HERNIA 03/05/2007  . FATTY LIVER DISEASE 03/05/2007  . Osteoarthritis 03/05/2007  . HEMORRHOIDS, INTERNAL 03/17/2005  . COLONIC POLYPS, HX OF 03/17/2005   Past Medical History:  Diagnosis Date  . Arthritis    . High cholesterol   . Hypertension   . Osteomyelitis (HCC)    Right great toe  . Peripheral vascular disease ([place])    diabetic  with osteomylitis  . Type II diabetes mellitus (HCC)     Family History  Problem Relation Age of Onset  . Diabetes Mellitus II Other   . Anesthesia problems Neg Hx     Past Surgical History:  Procedure Laterality Date  . AMPUTATION  07/23/2011   Procedure: AMPUTATION DIGIT;  Surgeon: Newt Minion, MD;  Location: [facility];  Service: Orthopedics;  Laterality: Right;  Right Great Toe Amputation  . AMPUTATION Right 09/18/2012   Procedure: Right Foot 2nd Ray Amputation;  Surgeon: Newt Minion, MD;  Location: [facility];  Service: Orthopedics;  Laterality: Right;  Right Foot 2nd Ray Amputation  . AMPUTATION Right 10/14/2012   Procedure: AMPUTATION FOOT;  Surgeon: Newt Minion, MD;  Location: [facility];  Service: Orthopedics;  Laterality: Right;  Right Foot Transmetatarsal Amputation  . AMPUTATION Left 09/23/2014   Procedure: Left Foot Transmetatarsal Amputation;  Surgeon: Newt Minion, MD;  Location: [facility];  Service: Orthopedics;  Laterality: Left;  . BACK SURGERY     lower  . CIRCUMCISION  2010  . FOOT FRACTURE SURGERY Left 1970's   "broke it playing football" (10/12/2012)  . HUMERUS FRACTURE SURGERY W/ IMPLANT Right 1960's   "put a plate in it" (3/79/0240)  . INCISION AND DRAINAGE OF WOUND Left 2006   "foot" (10/12/2012)  . LUMBAR [place] SURGERY  2008   Social History   Occupational History  . Not on file  Tobacco Use  . Smoking status: Current Every Day Smoker    Years: 12.00    Types: Pipe  . Smokeless tobacco: Never Used  . Tobacco comment: 10/12/2012 offered smoking cessation materials; pt declines  Substance and Sexual Activity  . Alcohol use: No    Alcohol/week: 0.0 oz  . Drug use: No  . Sexual activity: Not Currently

## 2017-11-19 ENCOUNTER — Telehealth (INDEPENDENT_AMBULATORY_CARE_PROVIDER_SITE_OTHER): Payer: Self-pay | Admitting: Orthopedic Surgery

## 2017-11-19 NOTE — Telephone Encounter (Signed)
Ok refill? 

## 2017-11-19 NOTE — Telephone Encounter (Signed)
Patient called needing Rx refilled (Hydrocodone) Patient advised he has not be scheduled for surgery yet and he's in a lot of pain. The number to contact patient is [phone]

## 2017-11-19 NOTE — Telephone Encounter (Signed)
Pt is being sch for CTR. Please advise.

## 2017-11-20 ENCOUNTER — Other Ambulatory Visit (INDEPENDENT_AMBULATORY_CARE_PROVIDER_SITE_OTHER): Payer: Self-pay

## 2017-11-20 MED ORDER — HYDROCODONE-ACETAMINOPHEN 5-325 MG PO TABS: 1.0000 | ORAL_TABLET | ORAL | 0 refills | Status: DC | PRN

## 2017-11-20 NOTE — Telephone Encounter (Signed)
Called pt to adviserx is ready for pick up. He is also aking to see when his CTR might be set up.

## 2017-12-02 ENCOUNTER — Telehealth (INDEPENDENT_AMBULATORY_CARE_PROVIDER_SITE_OTHER): Payer: Self-pay | Admitting: Orthopedic Surgery

## 2017-12-02 ENCOUNTER — Other Ambulatory Visit (INDEPENDENT_AMBULATORY_CARE_PROVIDER_SITE_OTHER): Payer: Self-pay

## 2017-12-02 NOTE — Telephone Encounter (Signed)
Hinton Dyer with [facility] is trying to clarify what is going on with this patient. She said he was seen in their office on 6/14, he stated that Dr. Sharol Given said he would need an order from them (PCP) in order to have some one come from home health to dress area on his foot. She is unsure if what patient is saying is true since they dont provide him wound care, we do. Patient cannot reach area on his foot by himself to clean and wrap it. Please advise Hinton Dyer to let her know what to do # [phone]

## 2017-12-02 NOTE — Telephone Encounter (Signed)
I called and lm on vm for [name] to advise thaat we had not sent HHN to the house since October but that we would arrange for them to come out and see the pt. I also advised pt of the referral and that if he had not heard anything in a few days to call and let me know. Order faxed to [facility] for twice a week to call and let me know if they can see the pt

## 2017-12-03 ENCOUNTER — Telehealth (INDEPENDENT_AMBULATORY_CARE_PROVIDER_SITE_OTHER): Payer: Self-pay | Admitting: Orthopedic Surgery

## 2017-12-03 NOTE — Telephone Encounter (Signed)
Jacqulyn Bath with [facility] called left voicemail message stating the orders that were received is not skilled dressing - antibiotic ointment and dry dressing  change weekly. Mellissa said this is not skilled and will not be able to pick up this patient. She said not enough of skilled need to be able to do that. Mellissa asked to please call if clarification is needed for anything. The number to contact [facility] is [phone]

## 2017-12-03 NOTE — Telephone Encounter (Signed)
Melissa called back to discuss pt care

## 2017-12-03 NOTE — Telephone Encounter (Signed)
I called and lm on vm for melissa to advise that the pt is not physically able to do this himself. He lacks the flexibility and strength to get to his foot in order to apply this dressing. Advised that the pt would benefit from a [place] aid if this was a possibility to assist with ADLs  Also advised that if we could have HHN go out and eval the wound and make suggestion for wound care this would also be good. Will hold message until I hear back.

## 2017-12-04 ENCOUNTER — Telehealth (INDEPENDENT_AMBULATORY_CARE_PROVIDER_SITE_OTHER): Payer: Self-pay | Admitting: Orthopedic Surgery

## 2017-12-04 ENCOUNTER — Ambulatory Visit (INDEPENDENT_AMBULATORY_CARE_PROVIDER_SITE_OTHER): Payer: Medicare Other | Admitting: Orthopedic Surgery

## 2017-12-04 NOTE — Telephone Encounter (Signed)
I called pt and he is asking about the status of the referral for HHN. I advised that I will call [facility] and check again and call him back once I have heard.

## 2017-12-04 NOTE — Telephone Encounter (Signed)
I called and lm on vm for melissa to advise that we need to set up some type of HHN eval and wound care for this pt. He is in need and whatever we can do to help facilitate the home care that he needs we will do this. Will wait for her to call back and then discuss plan with the pt.

## 2017-12-04 NOTE — Telephone Encounter (Signed)
Patient is asking you give him a call back said its very important and he only wanted to speak with you. CB # [phone]

## 2017-12-05 ENCOUNTER — Other Ambulatory Visit (INDEPENDENT_AMBULATORY_CARE_PROVIDER_SITE_OTHER): Payer: Self-pay

## 2017-12-05 NOTE — Telephone Encounter (Signed)
Order changed to [facility] to eval, treat and make recommendation for ulceration to left transmet ulceration. They will not be able to see the pt till the early part of next week. Called pt to advise.

## 2017-12-05 NOTE — Telephone Encounter (Signed)
Patient called back this morning and I advised him of the message from you.  He stated that his foot is leaking fluid and stinking.

## 2017-12-09 ENCOUNTER — Telehealth (INDEPENDENT_AMBULATORY_CARE_PROVIDER_SITE_OTHER): Payer: Self-pay

## 2017-12-09 NOTE — Telephone Encounter (Signed)
FYI-- [facility] called about doing Aquacell AG on wound twice a week, I gave verbal for this, if you think something different may need to be applied please call her back at [phone]

## 2017-12-16 ENCOUNTER — Encounter: Payer: Self-pay | Admitting: Internal Medicine

## 2017-12-16 ENCOUNTER — Ambulatory Visit (INDEPENDENT_AMBULATORY_CARE_PROVIDER_SITE_OTHER): Payer: Medicare Other | Admitting: Internal Medicine

## 2017-12-16 ENCOUNTER — Ambulatory Visit
Admission: RE | Admit: 2017-12-16 | Discharge: 2017-12-16 | Disposition: A | Discharge status: Home / Self Care | Payer: Medicare Other | Source: Ambulatory Visit | Attending: Internal Medicine | Admitting: Internal Medicine

## 2017-12-16 VITALS — BP 113/67 | HR 54 | Temp 97.9°F | Ht 73.0 in | Wt 182.0 lb

## 2017-12-16 DIAGNOSIS — M86172 Other acute osteomyelitis, left ankle and foot: Secondary | ICD-10-CM

## 2017-12-16 DIAGNOSIS — R21 Rash and other nonspecific skin eruption: Secondary | ICD-10-CM | POA: Diagnosis not present

## 2017-12-16 NOTE — Progress Notes (Addendum)
[facility] for Infectious Disease      Reason for Consult: ? osteomyelitis    Referring Physician: Dr. Fara Olden    Patient ID: [name], male    DOB: 06-30-1940, 77 y.o.   MRN: [id number]  HPI:   Here for evaluation with concern for osteomyelitis of the left footl.  This is a new problem.  The patient reports he has no problem with his left foot. He has an ulcer and managed by Dr. Sharol Given.  No recent xray.  He reports serosanguinous drainage.  No associated fever or chills.  Continuing wound care.  Missed a recent follow up with Dr. Sharol Given.   I previously treated him for septic arthritis of his foot though he had issues with home health, refused labs and did not complete treatment.  Since that time he has had a transmetatarsal amputation of the left foot but ulcer as above.  Per Dr. Sharol Given it did not probe to bone.  Has diabetes and previous Hgb A1C elevated but no recent results available to me.   Previous record reviewed in Epic, no recent xray.  Getting wound care with Dr. Sharol Given as above.    Past Medical History:  Diagnosis Date  . Arthritis   . High cholesterol   . Hypertension   . Osteomyelitis (HCC)    Right great toe  . Peripheral vascular disease ([place])    diabetic  with osteomylitis  . Type II diabetes mellitus ([place])     Prior to Admission medications   Medication Sig Start Date End Date Taking? Authorizing Provider  acetaminophen (TYLENOL) 500 MG tablet Take 1,000 mg by mouth every 6 (six) hours as needed for mild pain.   Yes [provider]  ALPHAGAN P 0.1 % SOLN Place 1 drop into both eyes 2 (two) times daily.  12/31/16  Yes [provider]  Capsaicin-Menthol-Methyl Sal (CAPSAICIN-METHYL SAL-MENTHOL) 0.025-1-12 % CREA Apply 1 application topically 4 (four) times daily as needed. 05/16/17  Yes Dondra Prader R, NP  DULoxetine (CYMBALTA) 30 MG capsule Take 1 capsule (30 mg total) by mouth daily. 08/14/17  Yes Patel, Donika K, DO  gabapentin (NEURONTIN)  100 MG capsule Take 1 capsule (100 mg total) by mouth 3 (three) times daily. Patient taking differently: Take 100 mg by mouth 2 (two) times daily.  08/15/17  Yes Patel, Donika K, DO  HYDROcodone-acetaminophen (NORCO/VICODIN) 5-325 MG tablet Take 1 tablet by mouth every 4 (four) hours as needed for moderate pain. 11/20/17  Yes Newt Minion, MD  latanoprost (XALATAN) 0.005 % ophthalmic solution Place 1 drop into both eyes at bedtime.  09/12/14  Yes [provider]  metFORMIN (GLUCOPHAGE) 1000 MG tablet Take 1,000 mg by mouth 2 (two) times daily.    Yes [provider]  pyridOXINE (VITAMIN B-6) 50 MG tablet Take 50 mg by mouth daily.   Yes [provider]  thiamine (VITAMIN B-1) 100 MG tablet Take 100 mg by mouth daily.   Yes [provider]  timolol (TIMOPTIC) 0.5 % ophthalmic solution Place 1 drop into both eyes 2 (two) times daily. 05/19/17  Yes [provider]  vitamin B-12 (CYANOCOBALAMIN) 500 MCG tablet Take 500 mcg by mouth daily.   Yes [provider]    No Known Allergies  Social History   Tobacco Use  . Smoking status: Current Every Day Smoker    Years: 12.00    Types: Pipe  . Smokeless tobacco: Never Used  . Tobacco comment: 10/12/2012  offered smoking cessation materials; pt declines  Substance Use Topics  . Alcohol use: No    Alcohol/week: 0.0 oz  . Drug use: No    Family History  Problem Relation Age of Onset  . Diabetes Mellitus II Other   . Anesthesia problems Neg Hx     Review of Systems  Constitutional: negative for fevers, chills and malaise Gastrointestinal: negative for diarrhea Integument/breast: positive for rash and pruritus All other systems reviewed and are negative    Constitutional: in no apparent distress  Blood pressure 113/67, pulse (!) 54, temperature 97.9 F (36.6 C), height '6\' 1"'$  (1.854 m), weight 182 lb (82.6 kg). EYES: anicteric ENMT: no thrush Cardiovascular: Cor RRR Respiratory: CTA B;  normal respiratory effort GI: Bowel sounds are normal, liver is not enlarged, spleen is not enlarged Musculoskeletal: left foot s/p transmetatarsal amputation.  Ulcer at midfoot, does not probe to bone, serosanguinous drainage, some odor but no pus, no surrounding erythema.   Skin: both arms with hyperpigmented areas, circular, pruritic.   Hematologic: no cervical lad  Labs: Lab Results  Component Value Date   WBC 6.6 09/27/2017   HGB 13.4 09/27/2017   HCT 40.5 09/27/2017   MCV 85.8 09/27/2017   PLT 275 09/27/2017    Lab Results  Component Value Date   CREATININE 1.32 (H) 09/27/2017   BUN 12 09/27/2017   NA 133 (L) 09/27/2017   K 4.0 09/27/2017   CL 101 09/27/2017   CO2 20 (L) 09/27/2017    Lab Results  Component Value Date   ALT 13 (L) 09/27/2017   AST 14 (L) 09/27/2017   ALKPHOS 52 09/27/2017   BILITOT 0.8 09/27/2017   INR 1.10 06/24/2017     Assessment: ulcer, possible osteomyelitis.  Unclear if infected bone.  No particular suspicion.  Has been healing per the patient.   Rash - some can be related to hepatitis C, though never happened before.  Will also do HIV for completeness.    Plan: 1) check xray 2) check CRP and ESR Consider treatment if indicated 3) check hepatitis C and HIV  ADDENDUM 12/19/2017: labs and xray reviewed.  No significant concerns for osteomyelitis on exam or labs.  CRP is a bit elevated but with a near normal ESR, no particularly suggestive of osteomyelitis.  If his wound fails to start to heal, can repeat an xray in 2-3 weeks, but otherwise no follow up here indicated.   I also checked Hepatitis C and HIV with his rash and is negative.

## 2017-12-17 ENCOUNTER — Other Ambulatory Visit: Payer: Medicare Other

## 2017-12-17 DIAGNOSIS — Z114 Encounter for screening for human immunodeficiency virus [HIV]: Secondary | ICD-10-CM

## 2017-12-17 DIAGNOSIS — M86172 Other acute osteomyelitis, left ankle and foot: Secondary | ICD-10-CM

## 2017-12-17 DIAGNOSIS — Z9189 Other specified personal risk factors, not elsewhere classified: Secondary | ICD-10-CM

## 2017-12-17 DIAGNOSIS — Z1159 Encounter for screening for other viral diseases: Secondary | ICD-10-CM

## 2017-12-18 LAB — C-REACTIVE PROTEIN: CRP: 17.1 mg/L — ABNORMAL HIGH (ref <8.0)

## 2017-12-18 LAB — SEDIMENTATION RATE: Sed Rate: 34 mm/h — ABNORMAL HIGH (ref 0–20)

## 2017-12-18 LAB — HEPATITIS C ANTIBODY
Hepatitis C Ab: NONREACTIVE
SIGNAL TO CUT-OFF: 0.19 (ref <1.00)

## 2017-12-18 LAB — HIV ANTIBODY (ROUTINE TESTING W REFLEX): HIV 1&2 Ab, 4th Generation: NONREACTIVE

## 2017-12-19 ENCOUNTER — Telehealth: Payer: Self-pay

## 2017-12-19 NOTE — Telephone Encounter (Signed)
Called pt per Dr. Linus Salmons to inform him about test results. Pt was able to take my call and was informed that his Xray came back neg. For bone infection and that his labs for HIV and Hep C came back negative as well. Pt had no questions or concerns about my message during the call. [place]

## 2017-12-19 NOTE — Telephone Encounter (Signed)
-----   Message from Thayer Headings, MD sent at 12/19/2017 10:28 AM EDT ----- Please let him know his labs and xray do not suggest a bone infection so no treatment indicated.   His hepatitis C and HIV I checked due to his rash are also negative.   thanks

## 2018-01-05 ENCOUNTER — Other Ambulatory Visit (INDEPENDENT_AMBULATORY_CARE_PROVIDER_SITE_OTHER): Payer: Self-pay | Admitting: Orthopedic Surgery

## 2018-01-05 DIAGNOSIS — G5601 Carpal tunnel syndrome, right upper limb: Secondary | ICD-10-CM

## 2018-01-06 ENCOUNTER — Telehealth (INDEPENDENT_AMBULATORY_CARE_PROVIDER_SITE_OTHER): Payer: Self-pay | Admitting: Orthopedic Surgery

## 2018-01-06 NOTE — Telephone Encounter (Signed)
He can use aleve 2 po bid prn pain

## 2018-01-06 NOTE — Telephone Encounter (Signed)
Do you want to write a Rx for Hydrocodone.

## 2018-01-06 NOTE — Telephone Encounter (Signed)
Cancel surgry tomorrow and will reschedule prn

## 2018-01-06 NOTE — Progress Notes (Addendum)
I spoke to Mr [name], he states that he did not know that surgery was scheduled for tomorrow. "I need at least a weeks notice, I don't have anybody to drive me there or home or to help me out."  I told patient that I would call Dr. Jess Barters office and tell them. I called and left a message on Cheryl's voice mail./

## 2018-01-06 NOTE — Telephone Encounter (Signed)
Patient called to let Dr Sharol Given know that he was called today by Jan from [facility] telling him he is scheduled for surgery tomorrow. Patient said he do not have anyone to take him home when he is released from the hospital nor anyone to take care of him when he is released from the hospital. Patient said he will need to reschedule his surgery. Patient asked if he can get a Rx for (Hydrocodone) until he get rescheduled for surgery. The number to contact patient is [phone]

## 2018-01-06 NOTE — Telephone Encounter (Signed)
FYI. Please advise.

## 2018-01-06 NOTE — Telephone Encounter (Signed)
Called and left VM advising patient of message per Dr. Sharol Given.

## 2018-01-07 NOTE — Telephone Encounter (Signed)
Needs to be rescheduled

## 2018-01-20 ENCOUNTER — Other Ambulatory Visit (INDEPENDENT_AMBULATORY_CARE_PROVIDER_SITE_OTHER): Payer: Self-pay | Admitting: Orthopedic Surgery

## 2018-01-29 ENCOUNTER — Encounter (HOSPITAL_COMMUNITY): Payer: Self-pay | Admitting: *Deleted

## 2018-01-29 ENCOUNTER — Other Ambulatory Visit: Payer: Self-pay

## 2018-01-29 NOTE — Anesthesia Preprocedure Evaluation (Addendum)
Anesthesia Evaluation  Patient identified by MRN, date of birth, ID band Patient awake    Reviewed: Allergy & Precautions, NPO status , Patient's Chart, lab work & pertinent test results  History of Anesthesia Complications Negative for: history of anesthetic complications  Airway Mallampati: II  TM Distance: >3 FB Neck ROM: Full    Dental  (+) Dental Advisory Given, Missing   Pulmonary Current Smoker,    breath sounds clear to auscultation       Cardiovascular hypertension (denies), (-) angina+ Peripheral Vascular Disease   Rhythm:Regular Rate:Normal   '14 TTE - normal EF, moderate LVH, grade 2 diastolic dysfx    Neuro/Psych  Headaches, negative psych ROS   GI/Hepatic negative GI ROS, Neg liver ROS,   Endo/Other  diabetes, Type 2, Oral Hypoglycemic Agents  Renal/GU negative Renal ROS  negative genitourinary   Musculoskeletal  (+) Arthritis ,   Abdominal   Peds  Hematology negative hematology ROS (+)   Anesthesia Other Findings   Reproductive/Obstetrics                            Anesthesia Physical Anesthesia Plan  ASA: III  Anesthesia Plan: MAC   Post-op Pain Management:    Induction: Intravenous  PONV Risk Score and Plan: 1 and Propofol infusion and Treatment may vary due to age or medical condition  Airway Management Planned: Natural Airway and Simple Face Mask  Additional Equipment: None  Intra-op Plan:   Post-operative Plan:   Informed Consent: I have reviewed the patients History and Physical, chart, labs and discussed the procedure including the risks, benefits and alternatives for the proposed anesthesia with the patient or authorized representative who has indicated his/her understanding and acceptance.   Dental advisory given  Plan Discussed with: CRNA and Anesthesiologist  Anesthesia Plan Comments:        Anesthesia Quick Evaluation

## 2018-01-29 NOTE — Progress Notes (Signed)
[name] denies chest pain or short of breath.  Patient denies having HTN.  [name] reports that CBG has been running around 100.  Patient states that last A1C was 7, drawn at PCP's [name] office. I instructed patient to not take Metformin in am. I instructed patient to check CBG after awaking and every 2 hours until arrival  to the hospital.  I Instructed patient if CBG is less than 70 to take drink 1/2 cup of a clear juice or "[facility]". Recheck CBG in 15 minutes

## 2018-01-30 ENCOUNTER — Encounter (HOSPITAL_COMMUNITY)
Admission: RE | Disposition: A | Discharge status: Home / Self Care | Payer: Self-pay | Source: Ambulatory Visit | Attending: Orthopedic Surgery

## 2018-01-30 ENCOUNTER — Ambulatory Visit (HOSPITAL_COMMUNITY)
Admission: RE | Admit: 2018-01-30 | Discharge: 2018-01-30 | Disposition: A | Discharge status: Home / Self Care | Payer: Medicare Other | Source: Ambulatory Visit | Attending: Orthopedic Surgery | Admitting: Orthopedic Surgery

## 2018-01-30 ENCOUNTER — Encounter (HOSPITAL_COMMUNITY): Payer: Self-pay | Admitting: Anesthesiology

## 2018-01-30 ENCOUNTER — Encounter (HOSPITAL_COMMUNITY): Payer: Self-pay | Admitting: *Deleted

## 2018-01-30 DIAGNOSIS — E1140 Type 2 diabetes mellitus with diabetic neuropathy, unspecified: Secondary | ICD-10-CM | POA: Diagnosis not present

## 2018-01-30 DIAGNOSIS — Z791 Long term (current) use of non-steroidal anti-inflammatories (NSAID): Secondary | ICD-10-CM | POA: Insufficient documentation

## 2018-01-30 DIAGNOSIS — M1990 Unspecified osteoarthritis, unspecified site: Secondary | ICD-10-CM | POA: Diagnosis not present

## 2018-01-30 DIAGNOSIS — Z7984 Long term (current) use of oral hypoglycemic drugs: Secondary | ICD-10-CM | POA: Insufficient documentation

## 2018-01-30 DIAGNOSIS — G5601 Carpal tunnel syndrome, right upper limb: Secondary | ICD-10-CM | POA: Insufficient documentation

## 2018-01-30 HISTORY — DX: Type 2 diabetes mellitus with diabetic neuropathy, unspecified: E11.40

## 2018-01-30 HISTORY — PX: CARPAL TUNNEL RELEASE: SHX101

## 2018-01-30 LAB — CBC
HCT: 42.3 % (ref 39.0–52.0)
Hemoglobin: 13.2 g/dL (ref 13.0–17.0)
MCH: 28.8 pg (ref 26.0–34.0)
MCHC: 31.2 g/dL (ref 30.0–36.0)
MCV: 92.4 fL (ref 78.0–100.0)
Platelets: 266 10*3/uL (ref 150–400)
RBC: 4.58 MIL/uL (ref 4.22–5.81)
RDW: 14.4 % (ref 11.5–15.5)
WBC: 7 10*3/uL (ref 4.0–10.5)

## 2018-01-30 LAB — GLUCOSE, CAPILLARY
Glucose-Capillary: 91 mg/dL (ref 70–99)
Glucose-Capillary: 85 mg/dL (ref 70–99)
Glucose-Capillary: 84 mg/dL (ref 70–99)

## 2018-01-30 LAB — BASIC METABOLIC PANEL
Anion gap: 9 (ref 5–15)
BUN: 14 mg/dL (ref 8–23)
CO2: 23 mmol/L (ref 22–32)
Calcium: 9.1 mg/dL (ref 8.9–10.3)
Chloride: 106 mmol/L (ref 98–111)
Creatinine, Ser: 1.23 mg/dL (ref 0.61–1.24)
GFR calc Af Amer: >60 mL/min (ref >60)
GFR calc non Af Amer: 56 mL/min — ABNORMAL LOW (ref >60)
Glucose, Bld: 92 mg/dL (ref 70–99)
Potassium: 4 mmol/L (ref 3.5–5.1)
Sodium: 138 mmol/L (ref 135–145)

## 2018-01-30 LAB — HEMOGLOBIN A1C
Hgb A1c MFr Bld: 5.4 % (ref 4.8–5.6)
Mean Plasma Glucose: 108.28 mg/dL

## 2018-01-30 SURGERY — CARPAL TUNNEL RELEASE
Anesthesia: Monitor Anesthesia Care | Laterality: Right

## 2018-01-30 MED ORDER — OXYCODONE-ACETAMINOPHEN 5-325 MG PO TABS: 1.0000 | ORAL_TABLET | ORAL | 0 refills | Status: DC | PRN

## 2018-01-30 MED ORDER — LIDOCAINE HCL (CARDIAC) PF 100 MG/5ML IV SOSY
PREFILLED_SYRINGE | INTRAVENOUS | Status: DC | PRN
  Administered 2018-01-30: 20 mg via INTRATRACHEAL

## 2018-01-30 MED ORDER — PROPOFOL 500 MG/50ML IV EMUL
INTRAVENOUS | Status: DC | PRN
  Administered 2018-01-30: 50 ug/kg/min via INTRAVENOUS

## 2018-01-30 MED ORDER — CEFAZOLIN SODIUM-DEXTROSE 2-4 GM/100ML-% IV SOLN
2.0000 g | INTRAVENOUS | Status: AC
  Administered 2018-01-30: 2 g via INTRAVENOUS
  Filled 2018-01-30: qty 100

## 2018-01-30 MED ORDER — ONDANSETRON HCL 4 MG/2ML IJ SOLN
INTRAMUSCULAR | Status: DC | PRN
  Administered 2018-01-30: 4 mg via INTRAVENOUS

## 2018-01-30 MED ORDER — FENTANYL CITRATE (PF) 100 MCG/2ML IJ SOLN: 25.0000 ug | INTRAMUSCULAR | Status: DC | PRN

## 2018-01-30 MED ORDER — ONDANSETRON HCL 4 MG/2ML IJ SOLN: 4.0000 mg | Freq: Once | INTRAMUSCULAR | Status: DC | PRN

## 2018-01-30 MED ORDER — LIDOCAINE HCL (PF) 1 % IJ SOLN
INTRAMUSCULAR | Status: AC
  Filled 2018-01-30: qty 30

## 2018-01-30 MED ORDER — CHLORHEXIDINE GLUCONATE 4 % EX LIQD: 60.0000 mL | Freq: Once | CUTANEOUS | Status: DC

## 2018-01-30 MED ORDER — FENTANYL CITRATE (PF) 100 MCG/2ML IJ SOLN
INTRAMUSCULAR | Status: DC | PRN
  Administered 2018-01-30: 25 ug via INTRAVENOUS

## 2018-01-30 MED ORDER — PROPOFOL 10 MG/ML IV BOLUS
INTRAVENOUS | Status: AC
Start: 2018-01-30 — End: ?
  Filled 2018-01-30: qty 20

## 2018-01-30 MED ORDER — FENTANYL CITRATE (PF) 250 MCG/5ML IJ SOLN
INTRAMUSCULAR | Status: AC
  Filled 2018-01-30: qty 5

## 2018-01-30 MED ORDER — OXYCODONE HCL 5 MG/5ML PO SOLN: 5.0000 mg | Freq: Once | ORAL | Status: DC | PRN

## 2018-01-30 MED ORDER — LIDOCAINE HCL 1 % IJ SOLN
INTRAMUSCULAR | Status: DC | PRN
Start: 2018-01-30 — End: 2018-01-30
  Administered 2018-01-30: 10 mL

## 2018-01-30 MED ORDER — LACTATED RINGERS IV SOLN
INTRAVENOUS | Status: DC
  Administered 2018-01-30: 07:00:00 via INTRAVENOUS

## 2018-01-30 MED ORDER — OXYCODONE HCL 5 MG PO TABS: 5.0000 mg | ORAL_TABLET | Freq: Once | ORAL | Status: DC | PRN

## 2018-01-30 MED ORDER — LACTATED RINGERS IV SOLN
INTRAVENOUS | Status: DC | PRN
  Administered 2018-01-30: 08:00:00 via INTRAVENOUS

## 2018-01-30 SURGICAL SUPPLY — 35 items
BLADE AVERAGE 25X9 (BLADE) IMPLANT
BNDG COHESIVE 4X5 TAN STRL (GAUZE/BANDAGES/DRESSINGS) ×1 IMPLANT
BNDG GAUZE ELAST 4 BULKY (GAUZE/BANDAGES/DRESSINGS) ×1 IMPLANT
CORDS BIPOLAR (ELECTRODE) ×2 IMPLANT
COTTON STERILE ROLL (GAUZE/BANDAGES/DRESSINGS) IMPLANT
COVER SURGICAL LIGHT HANDLE (MISCELLANEOUS) ×3 IMPLANT
DRAPE OEC MINIVIEW 54X84 (DRAPES) IMPLANT
DRAPE U-SHAPE 47X51 STRL (DRAPES) ×2 IMPLANT
DRSG EMULSION OIL 3X3 NADH (GAUZE/BANDAGES/DRESSINGS) ×2 IMPLANT
DURAPREP 26ML APPLICATOR (WOUND CARE) ×2 IMPLANT
ELECT REM PT RETURN 9FT ADLT (ELECTROSURGICAL) ×2
ELECTRODE REM PT RTRN 9FT ADLT (ELECTROSURGICAL) ×1 IMPLANT
GAUZE SPONGE 2X2 8PLY STRL LF (GAUZE/BANDAGES/DRESSINGS) IMPLANT
GAUZE SPONGE 4X4 12PLY STRL LF (GAUZE/BANDAGES/DRESSINGS) ×1 IMPLANT
GLOVE BIOGEL PI IND STRL 9 (GLOVE) ×1 IMPLANT
GLOVE BIOGEL PI INDICATOR 9 (GLOVE) ×1
GLOVE SURG ORTHO 9.0 STRL STRW (GLOVE) ×2 IMPLANT
GOWN STRL REUS W/ TWL XL LVL3 (GOWN DISPOSABLE) ×2 IMPLANT
GOWN STRL REUS W/TWL XL LVL3 (GOWN DISPOSABLE) ×4
KIT BASIN OR (CUSTOM PROCEDURE TRAY) ×2 IMPLANT
KIT TURNOVER KIT B (KITS) ×2 IMPLANT
NDL HYPO 25GX1X1/2 BEV (NEEDLE) IMPLANT
NEEDLE HYPO 25GX1X1/2 BEV (NEEDLE) IMPLANT
NS IRRIG 1000ML POUR BTL (IV SOLUTION) ×2 IMPLANT
PACK ORTHO EXTREMITY (CUSTOM PROCEDURE TRAY) ×2 IMPLANT
PAD ARMBOARD 7.5X6 YLW CONV (MISCELLANEOUS) ×4 IMPLANT
SPONGE GAUZE 2X2 STER 10/PKG (GAUZE/BANDAGES/DRESSINGS)
SUCTION FRAZIER HANDLE 10FR (MISCELLANEOUS)
SUCTION TUBE FRAZIER 10FR DISP (MISCELLANEOUS) IMPLANT
SUT ETHILON 2 0 FS 18 (SUTURE) IMPLANT
SUT VIC AB 2-0 FS1 27 (SUTURE) IMPLANT
SYR CONTROL 10ML LL (SYRINGE) IMPLANT
TOWEL OR 17X24 6PK STRL BLUE (TOWEL DISPOSABLE) ×2 IMPLANT
TOWEL OR 17X26 10 PK STRL BLUE (TOWEL DISPOSABLE) ×2 IMPLANT
TUBE CONNECTING 12X1/4 (SUCTIONS) IMPLANT

## 2018-01-30 NOTE — Anesthesia Procedure Notes (Signed)
Procedure Name: MAC Date/Time: 01/30/2018 8:55 AM Performed by: Neldon Newport, CRNA Pre-anesthesia Checklist: Patient identified, Emergency Drugs available, Patient being monitored, Suction available and Timeout performed Oxygen Delivery Method: Simple face mask

## 2018-01-30 NOTE — H&P (Signed)
[name] is an 77 y.o. male.   Chief Complaint: Carpal tunnel syndrome right wrist. HPI: Patient is a 77 year old gentleman with carpal tunnel syndrome on the right.  He has thenar atrophy weakness with grip strength numbness in his fingers has failed conservative therapy patient has type 2 diabetes.  Past Medical History:  Diagnosis Date  . Arthritis   . Diabetic neuropathy ([place])   . High cholesterol   . Hypertension    denies  . Osteomyelitis (HCC)    Right great toe  . Peripheral vascular disease ([place])    diabetic  with osteomylitis  . Type II diabetes mellitus ([place])     Past Surgical History:  Procedure Laterality Date  . AMPUTATION  07/23/2011   Procedure: AMPUTATION DIGIT;  Surgeon: Newt Minion, MD;  Location: [place];  Service: Orthopedics;  Laterality: Right;  Right Great Toe Amputation  . AMPUTATION Right 09/18/2012   Procedure: Right Foot 2nd Ray Amputation;  Surgeon: Newt Minion, MD;  Location: [place];  Service: Orthopedics;  Laterality: Right;  Right Foot 2nd Ray Amputation  . AMPUTATION Right 10/14/2012   Procedure: AMPUTATION FOOT;  Surgeon: Newt Minion, MD;  Location: [place];  Service: Orthopedics;  Laterality: Right;  Right Foot Transmetatarsal Amputation  . AMPUTATION Left 09/23/2014   Procedure: Left Foot Transmetatarsal Amputation;  Surgeon: Newt Minion, MD;  Location: [place];  Service: Orthopedics;  Laterality: Left;  . BACK SURGERY     lower  . CIRCUMCISION  2010  . COLONOSCOPY    . FOOT FRACTURE SURGERY Left 1970's   "broke it playing football" (10/12/2012)  . HUMERUS FRACTURE SURGERY W/ IMPLANT Right 1960's   "put a plate in it" (03/02/9449)  . INCISION AND DRAINAGE OF WOUND Left 2006   "foot" (10/12/2012)  . [place] SURGERY  2008    Family History  Problem Relation Age of Onset  . Diabetes Mellitus II Other   . Anesthesia problems Neg Hx    Social History:  reports that he has been smoking pipe. He has smoked for the past 12.00 years. He has  never used smokeless tobacco. He reports that he does not drink alcohol or use drugs.  Allergies: No Known Allergies  Medications Prior to Admission  Medication Sig Dispense Refill  . acetaminophen (TYLENOL) 500 MG tablet Take 1,000 mg by mouth every 6 (six) hours as needed for mild pain.    . ALPHAGAN P 0.1 % SOLN Place 1 drop into both eyes 2 (two) times daily.     Marland Kitchen HYDROcodone-acetaminophen (NORCO/VICODIN) 5-325 MG tablet Take 1 tablet by mouth every 4 (four) hours as needed for moderate pain. 30 tablet 0  . latanoprost (XALATAN) 0.005 % ophthalmic solution Place 1 drop into both eyes at bedtime.   6  . metFORMIN (GLUCOPHAGE) 1000 MG tablet Take 1,000 mg by mouth 2 (two) times daily.     Marland Kitchen pyridOXINE (VITAMIN B-6) 50 MG tablet Take 50 mg by mouth 2 (two) times daily.     Marland Kitchen thiamine (VITAMIN B-1) 100 MG tablet Take 100 mg by mouth daily.    . timolol (TIMOPTIC) 0.5 % ophthalmic solution Place 1 drop into both eyes 2 (two) times daily.  11  . vitamin B-12 (CYANOCOBALAMIN) 500 MCG tablet Take 500 mcg by mouth daily.    . DULoxetine (CYMBALTA) 30 MG capsule Take 1 capsule (30 mg total) by mouth daily. (Patient not taking: Reported on 01/01/2018) 30 capsule 3    Results  for orders placed or performed during the hospital encounter of 01/30/18 (from the past 48 hour(s))  Glucose, capillary     Status: None   Collection Time: 01/30/18  6:14 AM  Result Value Ref Range   Glucose-Capillary 91 70 - 99 mg/dL   Comment 1 Notify RN    Comment 2 Document in Chart   CBC     Status: None   Collection Time: 01/30/18  6:16 AM  Result Value Ref Range   WBC 7.0 4.0 - 10.5 K/uL   RBC 4.58 4.22 - 5.81 MIL/uL   Hemoglobin 13.2 13.0 - 17.0 g/dL   HCT 42.3 39.0 - 52.0 %   MCV 92.4 78.0 - 100.0 fL   MCH 28.8 26.0 - 34.0 pg   MCHC 31.2 30.0 - 36.0 g/dL   RDW 14.4 11.5 - 15.5 %   Platelets 266 150 - 400 K/uL    Comment: Performed at [facility], [place] [address], [place], [place] [address]   Hemoglobin A1c     Status: None   Collection Time: 01/30/18  6:16 AM  Result Value Ref Range   Hgb A1c MFr Bld 5.4 4.8 - 5.6 %    Comment: (NOTE) Pre diabetes:          5.7%-6.4% Diabetes:              >6.4% Glycemic control for   <7.0% adults with diabetes    Mean Plasma Glucose 108.28 mg/dL    Comment: Performed at [facility] [address], [place], [place] [address]   No results found.  Review of Systems  All other systems reviewed and are negative.   Blood pressure (!) 177/77, pulse (!) 58, temperature 97.9 F (36.6 C), temperature source Oral, resp. rate 18, height 6\' 1"  (1.854 m), weight 81.6 kg, SpO2 100 %. Physical Exam  Examination patient has good pulses in the right wrist.  He has thenar atrophy he has weakness with grip strength nerve conduction study shows severe carpal tunnel syndrome on the right.  Patient has numbness in the thumb index and long finger. Assessment/Plan Assessment: Carpal tunnel syndrome right wrist.  Plan: We will plan for right carpal tunnel release.  Risks and benefits were discussed including persistent numbness persistent weakness need for additional surgery neurovascular injury.  Patient states he understands wishes to proceed at this time.  Newt Minion, MD 01/30/2018, 6:51 AM

## 2018-01-30 NOTE — Op Note (Signed)
01/30/2018  9:16 AM  PATIENT:  [name]    PRE-OPERATIVE DIAGNOSIS:  Carpal Tunnel Syndrome Right  POST-OPERATIVE DIAGNOSIS:  Same  PROCEDURE:  RIGHT CARPAL TUNNEL RELEASE  SURGEON:  Newt Minion, MD  PHYSICIAN ASSISTANT:None ANESTHESIA:   General  PREOPERATIVE INDICATIONS:  [name] is a  77 y.o. male with a diagnosis of Carpal Tunnel Syndrome Right who failed conservative measures and elected for surgical management.    The risks benefits and alternatives were discussed with the patient preoperatively including but not limited to the risks of infection, bleeding, nerve injury, cardiopulmonary complications, the need for revision surgery, among others, and the patient was willing to proceed.  OPERATIVE IMPLANTS: None.  @ENCIMAGES @  OPERATIVE FINDINGS: Swelling of the median nerve.  OPERATIVE PROCEDURE: Patient was brought the operating room and underwent a MAC anesthetic.  After adequate levels of anesthesia were obtained patient's right upper extremity was prepped using DuraPrep draped into a sterile field a timeout was called.  An Esmarch was used for tourniquet at the wrist.  The wrist was locally anesthetized with 10 cc of 1% lidocaine plain.  After adequate levels of anesthesia obtained a longitudinal incision was made in line with the webspace between the long and ring finger and the flexor crease of the hand blunt dissection was carried down to the transverse carpal ligament at the wrist.  The [place] Corporal was then used to protect the median nerve in the carpal tunnel was released proximally distally under direct visualization a freer was placed to protect the median nerve and a 15 blade knife was used to release the carpal tunnel distally.  Visualization of the median nerve showed there to be no injuries no ecchymosis there was some swelling.  The Esmarch was released hemostasis was obtained the incision was closed using 3-0 nylon.  A sterile compressive dressing was applied  patient was taken the PACU in stable condition.   DISCHARGE PLANNING:  Antibiotic duration: 2 g Kefzol  Weightbearing: Not applicable  Pain medication: Prescription for Percocet  Dressing care/ Wound VAC: Leave dressing intact for 5 days  Ambulatory devices: Not applicable  Discharge to: Home.  Follow-up: In the office 1 week post operative.

## 2018-01-30 NOTE — Anesthesia Postprocedure Evaluation (Signed)
Anesthesia Post Note  Patient: [name]  Procedure(s) Performed: RIGHT CARPAL TUNNEL RELEASE (Right )     Patient location during evaluation: PACU Anesthesia Type: MAC Level of consciousness: awake and alert Pain management: pain level controlled Vital Signs Assessment: post-procedure vital signs reviewed and stable Respiratory status: spontaneous breathing, nonlabored ventilation and respiratory function stable Cardiovascular status: stable and blood pressure returned to baseline Anesthetic complications: no    Last Vitals:  Vitals:   01/30/18 0941 01/30/18 0956  BP: 128/66 136/71  Pulse: (!) 49 (!) 51  Resp: 14 14  Temp:    SpO2: 100% 100%    Last Pain:  Vitals:   01/30/18 0926  TempSrc:   PainSc: 0-No pain                 Audry Pili

## 2018-01-30 NOTE — Transfer of Care (Signed)
Immediate Anesthesia Transfer of Care Note  Patient: [name]  Procedure(s) Performed: RIGHT CARPAL TUNNEL RELEASE (Right )  Patient Location: PACU  Anesthesia Type:MAC  Level of Consciousness: awake, alert  and oriented  Airway & Oxygen Therapy: Patient Spontanous Breathing  Post-op Assessment: Report given to RN, Post -op Vital signs reviewed and stable and Patient moving all extremities X 4  Post vital signs: Reviewed and stable  Last Vitals:  Vitals Value Taken Time  BP 145/72 01/30/2018  9:26 AM  Temp    Pulse 50 01/30/2018  9:27 AM  Resp 8 01/30/2018  9:27 AM  SpO2 100 % 01/30/2018  9:27 AM  Vitals shown include unvalidated device data.  Last Pain:  Vitals:   01/30/18 0655  TempSrc:   PainSc: 0-No pain      Patients Stated Pain Goal: 8 (28/41/32 4401)  Complications: No apparent anesthesia complications

## 2018-01-31 ENCOUNTER — Encounter (HOSPITAL_COMMUNITY): Payer: Self-pay | Admitting: Orthopedic Surgery

## 2018-02-05 ENCOUNTER — Telehealth (INDEPENDENT_AMBULATORY_CARE_PROVIDER_SITE_OTHER): Payer: Self-pay | Admitting: Orthopedic Surgery

## 2018-02-05 NOTE — Telephone Encounter (Signed)
April -nurse from [facility] called needing verbal orders to continue seeing patient twice a week for new certification period and 2 PRN. April also advised needing more information on surgery to patient's right wrist. The number to contact April is  [phone]

## 2018-02-06 NOTE — Telephone Encounter (Signed)
Called and gave verbal ok to continue to see the pt. Advised he has an appt on 02/17/18 will remove the stitches from his CRT at that time and also recheck his foot.

## 2018-02-17 ENCOUNTER — Inpatient Hospital Stay (INDEPENDENT_AMBULATORY_CARE_PROVIDER_SITE_OTHER): Payer: Medicare Other | Admitting: Orthopedic Surgery

## 2018-02-17 ENCOUNTER — Ambulatory Visit (INDEPENDENT_AMBULATORY_CARE_PROVIDER_SITE_OTHER): Payer: Medicare Other | Admitting: Physician Assistant

## 2018-02-17 ENCOUNTER — Encounter (INDEPENDENT_AMBULATORY_CARE_PROVIDER_SITE_OTHER): Payer: Self-pay | Admitting: Physician Assistant

## 2018-02-17 VITALS — Ht 73.0 in | Wt 180.0 lb

## 2018-02-17 DIAGNOSIS — Z9889 Other specified postprocedural states: Secondary | ICD-10-CM | POA: Diagnosis not present

## 2018-02-17 DIAGNOSIS — L97421 Non-pressure chronic ulcer of left heel and midfoot limited to breakdown of skin: Secondary | ICD-10-CM

## 2018-02-17 NOTE — Progress Notes (Signed)
Office Visit Note   Patient: [name]           Date of Birth: 10/15/1940           MRN: [id number] Visit Date: 02/17/2018              Requested by: Casey Cha, MD [address] [place], [place] [address] PCP: Casey Cha, MD   Assessment & Plan: Visit Diagnoses:  1. Status post carpal tunnel release   2. Midfoot ulceration, left, limited to breakdown of skin ([place])     Plan: Sutures were harvested from the right wrist incision.  Patient can use his right hand and wrist to tolerance.  Did instruct the patient to begin scar massage to the right wrist incisional area with cocoa butter or Shea butter.  The left midfoot ulcer was sharply debrided following informed consent by the patient and he tolerated this well.  Bleeding was controlled with silver nitrate.  He will resume wearing his diabetic shoes with orthotics even around the house to offload the midfoot.  He will follow-up in 3 weeks.  Follow-Up Instructions: Return in about 3 weeks (around 03/10/2018).   Orders:  No orders of the defined types were placed in this encounter.  No orders of the defined types were placed in this encounter.     Procedures: No procedures performed   Clinical Data: No additional findings.   Subjective: Chief Complaint  Patient presents with  . 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  Review of Systems   Objective: Vital Signs: Ht 6\' 1"  (1.854 m)   Wt 180 lb (81.6 kg)   BMI 23.75 kg/m   Physical Exam on examination the carpal tunnel is healed well patient is improving he was given instructions for scar massage there is no redness no cellulitis no drainage no signs of infection.  Examination of the left foot he has a large necrotic Waggoner grade 1 ulcer he is not wearing  his extra-depth shoes he is not wearing his orthotics.  The ulcer was debrided of skin and soft tissue with a 10 blade knife after informed consent.  Silver nitrate was used for hemostasis the ulcer is 3 cm in diameter 3 mm deep after debridement.  Ortho Exam  Specialty Comments:  No specialty comments available.  Imaging: No results found.   PMFS History: Patient Active Problem List   Diagnosis Date Noted  . Rash and nonspecific skin eruption 12/16/2017  . Pain in right hand 09/11/2017  . Carpal tunnel syndrome, right upper limb 07/01/2017  . 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 Midfoot ulceration, left, limited to breakdown of skin ([place]) 07/22/2016  . 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 Osteomyelitis of ankle or foot, left, acute ([place]) 08/23/2014  . Cellulitis 10/12/2012  . Chest pain 10/12/2012  . Tobacco abuse 10/12/2012  . ABSCESS, AXILLA, LEFT 02/23/2008  . 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 CHEST WALL PAIN, ACUTE 12/03/2007  . CANDIDIASIS, GLANS PENIS 09/16/2007  . HEADACHE 08/04/2007  . 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 ([place]) 03/05/2007  . Other and unspecified hyperlipidemia 03/05/2007  . GOUT 03/05/2007  . ERECTILE DYSFUNCTION 03/05/2007  . EXTERNAL HEMORRHOIDS  03/05/2007  . VENTRAL HERNIA 03/05/2007  . FATTY LIVER DISEASE 03/05/2007  . Osteoarthritis 03/05/2007  . HEMORRHOIDS, INTERNAL 03/17/2005  . COLONIC POLYPS, HX OF 03/17/2005   Past Medical History:  Diagnosis Date  . Arthritis   . Diabetic neuropathy ([place])   . High cholesterol   . Hypertension    denies  . Osteomyelitis (HCC)    Right great toe  . Peripheral vascular disease ([place])    diabetic  with osteomylitis  . Type II diabetes mellitus ([facility])     Family History  Problem Relation Age of Onset  . Diabetes Mellitus II Other   . Anesthesia problems Neg Hx     Past  Surgical History:  Procedure Laterality Date  . AMPUTATION  07/23/2011   Procedure: AMPUTATION DIGIT;  Surgeon: Newt Minion, MD;  Location: [place];  Service: Orthopedics;  Laterality: Right;  Right Great Toe Amputation  . AMPUTATION Right 09/18/2012   Procedure: Right Foot 2nd Ray Amputation;  Surgeon: Newt Minion, MD;  Location: [place];  Service: Orthopedics;  Laterality: Right;  Right Foot 2nd Ray Amputation  . AMPUTATION Right 10/14/2012   Procedure: AMPUTATION FOOT;  Surgeon: Newt Minion, MD;  Location: [place];  Service: Orthopedics;  Laterality: Right;  Right Foot Transmetatarsal Amputation  . AMPUTATION Left 09/23/2014   Procedure: Left Foot Transmetatarsal Amputation;  Surgeon: Newt Minion, MD;  Location: [place];  Service: Orthopedics;  Laterality: Left;  . BACK SURGERY     lower  . CARPAL TUNNEL RELEASE Right 01/30/2018   Procedure: RIGHT CARPAL TUNNEL RELEASE;  Surgeon: Newt Minion, MD;  Location: [place];  Service: Orthopedics;  Laterality: Right;  . CIRCUMCISION  2010  . COLONOSCOPY    . FOOT FRACTURE SURGERY Left 1970's   "broke it playing football" (10/12/2012)  . HUMERUS FRACTURE SURGERY W/ IMPLANT Right 1960's   "put a plate in it" (1/61/0960)  . INCISION AND DRAINAGE OF WOUND Left 2006   "foot" (10/12/2012)  . LUMBAR [place] SURGERY  2008   Social History   Occupational History  . Not on file  Tobacco Use  . Smoking status: Current Every Day Smoker    Years: 12.00    Types: Pipe  . Smokeless tobacco: Never Used  Substance and Sexual Activity  . Alcohol use: No    Alcohol/week: 0.0 standard drinks  . Drug use: No  . Sexual activity: Not Currently

## 2018-02-25 ENCOUNTER — Other Ambulatory Visit: Payer: Self-pay | Admitting: Neurology

## 2018-03-11 IMAGING — CT CT HEAD W/O CM
4 series · 17 of 47 positions shown, 19 images · non-contrast
Comparison: CT head without contrast 08/02/2007

CLINICAL DATA: Ataxia. Stroke suspected. Bilateral extremity
weakness tremors beginning at 9 a.m. today.

EXAM:
CT HEAD WITHOUT CONTRAST
TECHNIQUE: Contiguous axial images were obtained from the base of the skull
through the vertex without intravenous contrast.

[Series 3: head wo · axial · 0.46mm/px · z∈[-233,-103]mm · 7 of 36 slices shown, 9 images]
[im 5/36  brain]
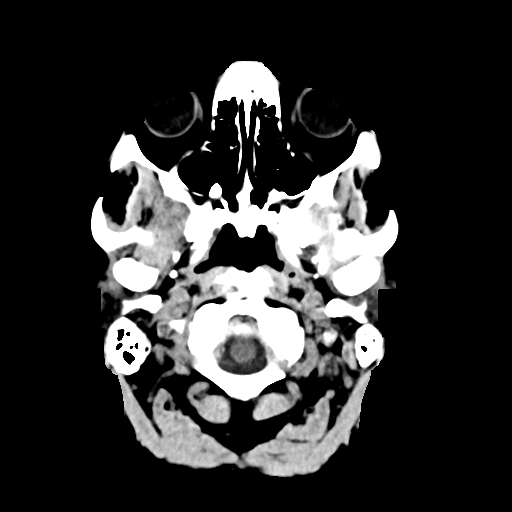
[im 5/36  bone]
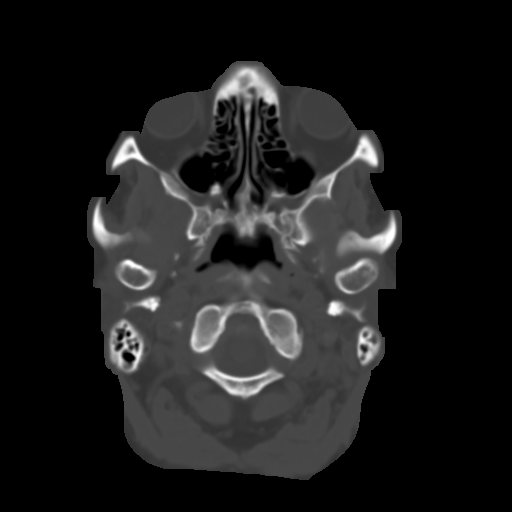
[im 9/36  brain]
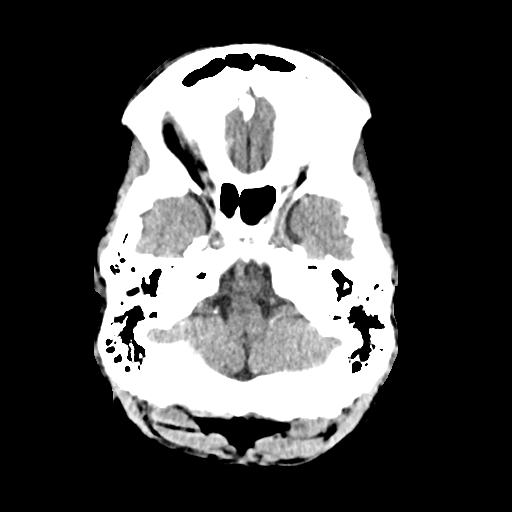
[im 14/36  brain]
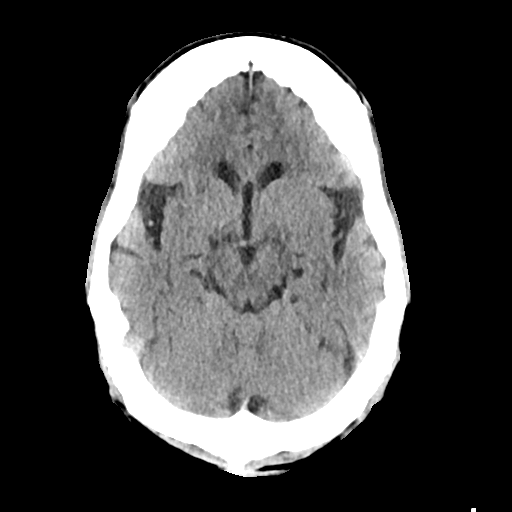
[im 18/36  brain]
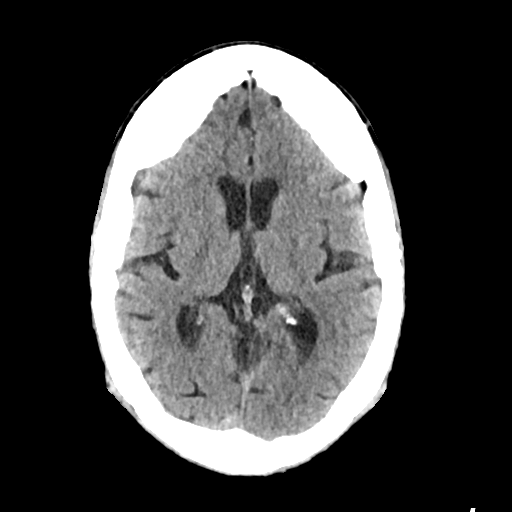
[im 22/36  brain]
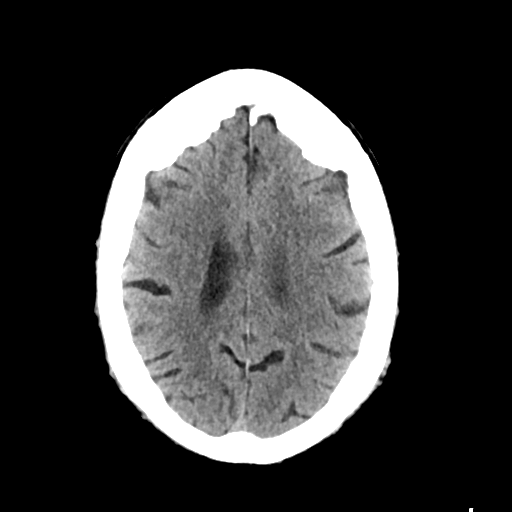
[im 22/36  bone]
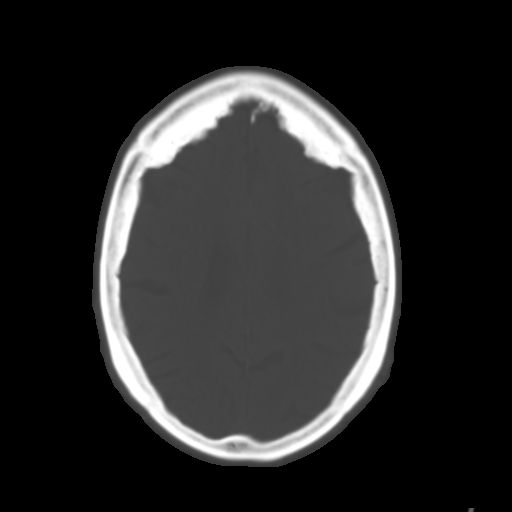
[im 27/36  brain]
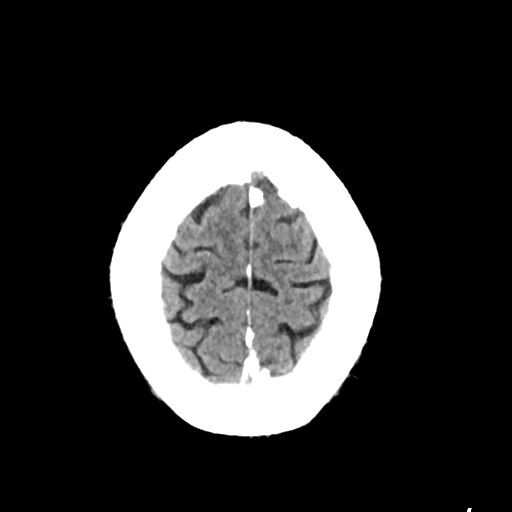
[im 31/36  brain]
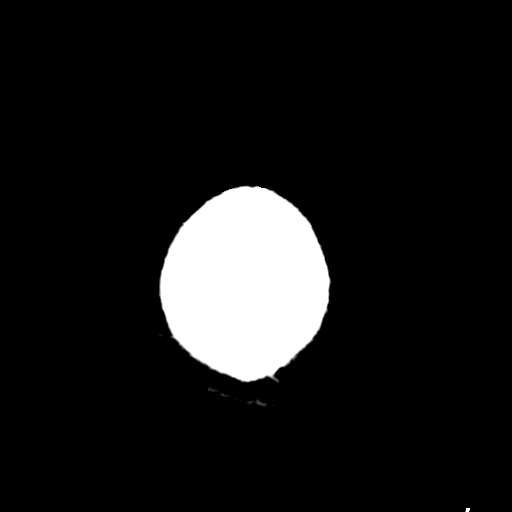

[Series 4: head bone · axial · 0.46mm/px · z∈[-237,-173]mm · 4 of 90 slices shown]
[im 9/90  bone]
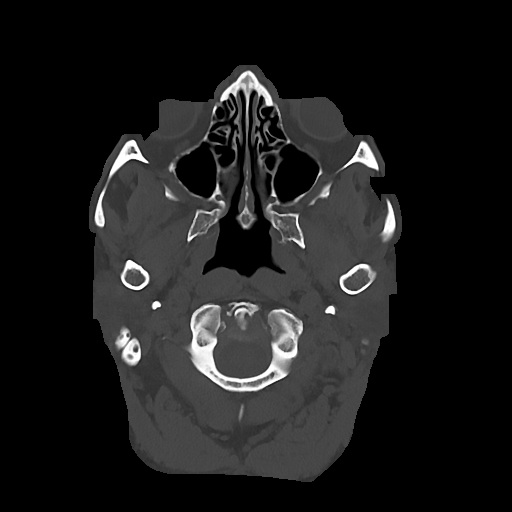
[im 18/90  bone]
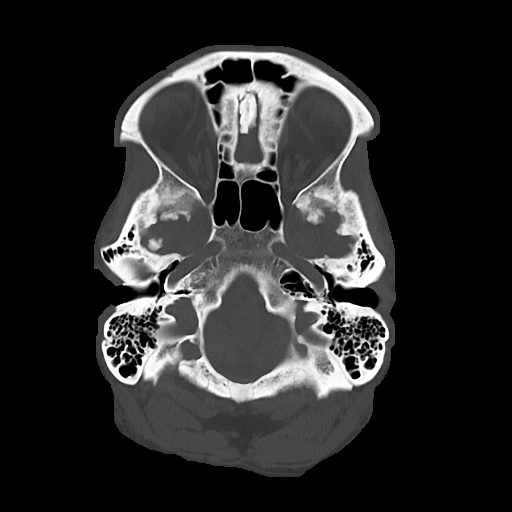
[im 27/90  bone]
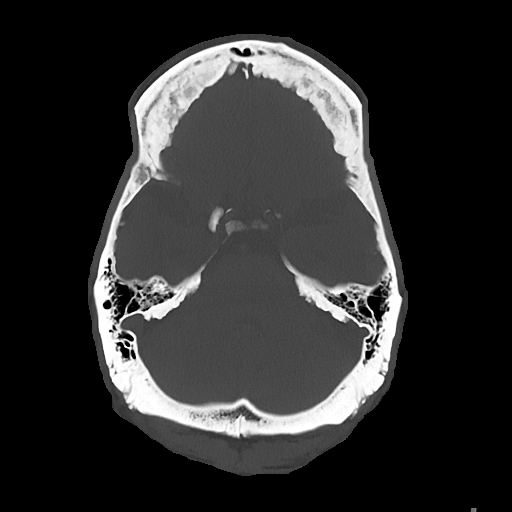
[im 41/90  bone]
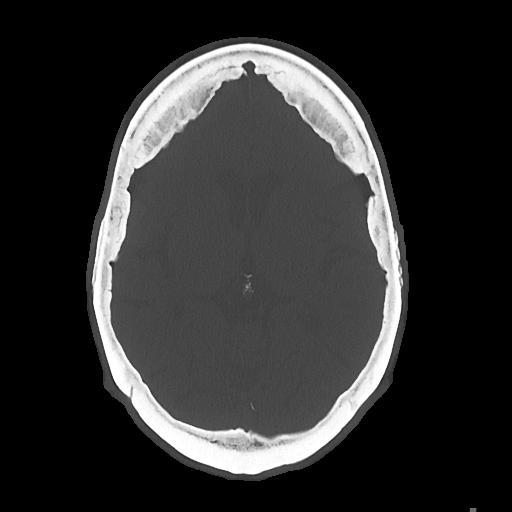

[Series 5: cor soft · coronal · 0.38mm/px · 3 of 80 slices shown]
[im 27/80  brain]
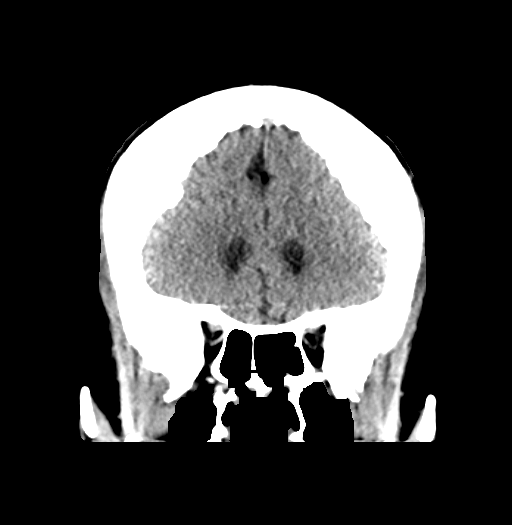
[im 36/80  brain]
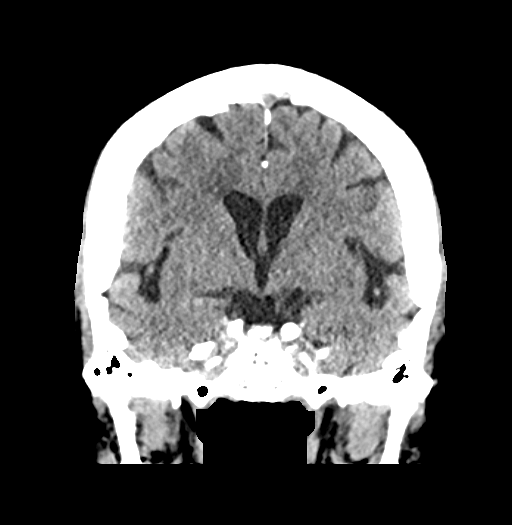
[im 44/80  brain]
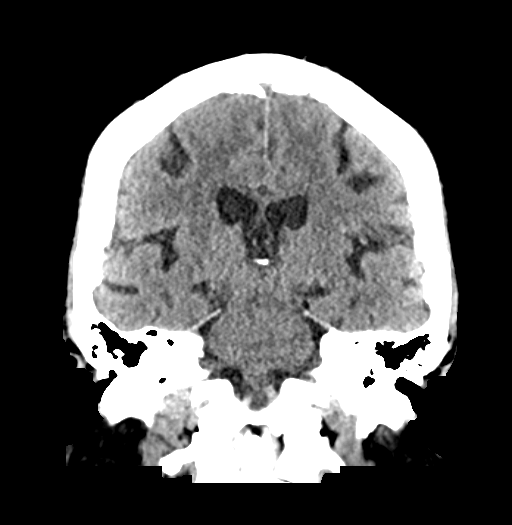

[Series 6: sag soft · sagittal · 0.43mm/px · 3 of 67 slices shown]
[im 23/67  brain]
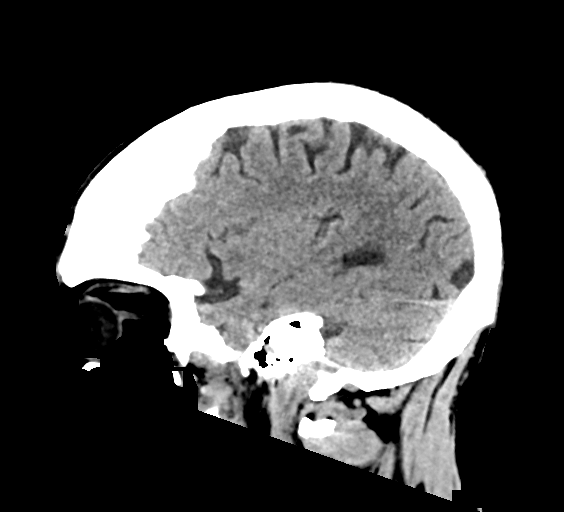
[im 34/67  brain]
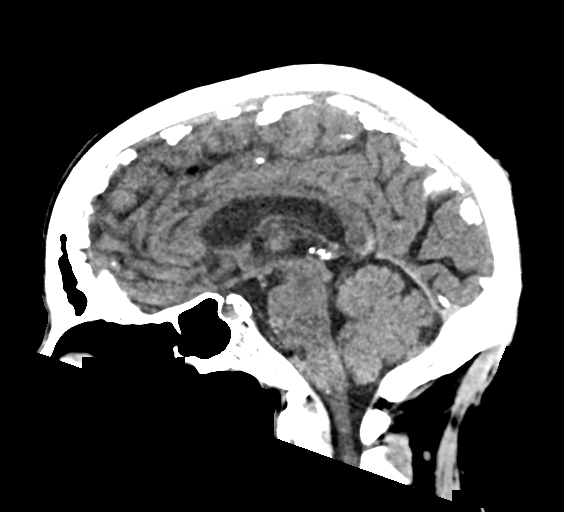
[im 45/67  brain]
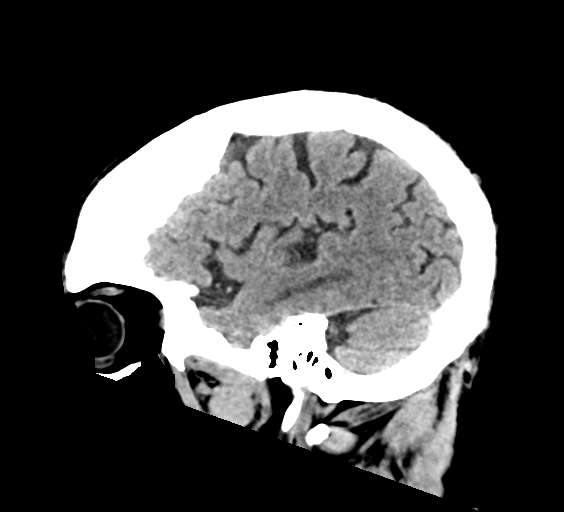

[17 of 47 positions shown; findings below may reference images not displayed]

FINDINGS: Brain: Mild atrophy has developed since the prior exam. Mild white
matter changes are noted as well. No acute infarct, hemorrhage, or
mass lesion is present. The brainstem and cerebellum are normal.
Diffuse dural calcifications are again noted.

Vascular: No hyperdense vessel or unexpected calcification.

Skull: Calvarium is intact.  Hyperostosis is noted.

Sinuses/Orbits: The paranasal sinuses and mastoid air cells are
clear. A partial scleral band is noted on the right.
IMPRESSION: 1. Progressive atrophy without acute intracranial abnormality.

## 2018-03-12 ENCOUNTER — Ambulatory Visit (INDEPENDENT_AMBULATORY_CARE_PROVIDER_SITE_OTHER): Payer: Medicare Other | Admitting: Orthopedic Surgery

## 2018-03-23 ENCOUNTER — Telehealth (INDEPENDENT_AMBULATORY_CARE_PROVIDER_SITE_OTHER): Payer: Self-pay | Admitting: Orthopedic Surgery

## 2018-03-23 NOTE — Telephone Encounter (Signed)
[facility]  [phone]   Crystal called from [facility] would like to know if Dr.Duda can change wound care orders. Measurements are currently 0.9 centimeters by 0.4 centimeters the last 4 weeks. Crystal stated the Aquacel silver dressing is not helping patient foot.

## 2018-03-24 NOTE — Telephone Encounter (Signed)
Pt has an appt on 03/26/18 follow up will evaluate at that appt and call with updated orders. Will hold message until then.

## 2018-03-25 NOTE — Telephone Encounter (Signed)
I would advise pt that he should keep his appt so we can assess his wound and update orders for nurse please.

## 2018-03-25 NOTE — Telephone Encounter (Signed)
FYI patient wanted to cancel his appt for tomorrow and reschedule for late in October. He had asked if his nurse called so I checked the messages. Not sure if you can give her updated orders without patient being seen.

## 2018-03-26 ENCOUNTER — Ambulatory Visit (INDEPENDENT_AMBULATORY_CARE_PROVIDER_SITE_OTHER): Payer: Medicare Other | Admitting: Orthopedic Surgery

## 2018-03-30 ENCOUNTER — Telehealth (INDEPENDENT_AMBULATORY_CARE_PROVIDER_SITE_OTHER): Payer: Self-pay | Admitting: Orthopedic Surgery

## 2018-03-30 ENCOUNTER — Ambulatory Visit (INDEPENDENT_AMBULATORY_CARE_PROVIDER_SITE_OTHER): Payer: Medicare Other | Admitting: Orthopedic Surgery

## 2018-03-30 NOTE — Telephone Encounter (Signed)
Patient called needing to know where can he get some  diabetic shoes.  Patient asked if Dr Sharol Given would write him a Rx for them. The number to contact patient is [phone]

## 2018-03-31 ENCOUNTER — Other Ambulatory Visit (INDEPENDENT_AMBULATORY_CARE_PROVIDER_SITE_OTHER): Payer: Self-pay

## 2018-03-31 NOTE — Telephone Encounter (Signed)
Please advise, Ive seen your message concerning patient. Patient was called and he stated he did have a wound the needs to be looked at but he will not be in until 11/12. He also stated he need diabetic shoes and if we write Rx for him to come in and pick up.

## 2018-04-01 NOTE — Telephone Encounter (Signed)
I called pt to advised that Dr. Sharol Given can discuss and write rx at his appt. The pt says he wants to discuss what would be the best shoe/ insert to help off load pressure to the ulcer and help balance. Dr. Sharol Given will be happy to eval and discuss at appt. Pt voiced understanding and will call with any other questions.

## 2018-04-02 ENCOUNTER — Other Ambulatory Visit: Payer: Self-pay | Admitting: Neurology

## 2018-04-06 ENCOUNTER — Telehealth (INDEPENDENT_AMBULATORY_CARE_PROVIDER_SITE_OTHER): Payer: Self-pay | Admitting: Orthopedic Surgery

## 2018-04-06 NOTE — Telephone Encounter (Signed)
April, nurse with [facility] is requesting orders for recertification to see patient 1 week 1 2 week 8 2 PRN visits.  She would also like to change wound care orders to iodosorb and cover with foam and kerlix, changing twice a week. Please advise # [phone]

## 2018-04-07 ENCOUNTER — Telehealth (INDEPENDENT_AMBULATORY_CARE_PROVIDER_SITE_OTHER): Payer: Self-pay

## 2018-04-07 NOTE — Telephone Encounter (Signed)
[facility] orders below but the pt must keep his appt in the next few weeks to assess the wound

## 2018-04-07 NOTE — Telephone Encounter (Signed)
Called and left vm for verbal orders for April at [place].

## 2018-04-07 NOTE — Telephone Encounter (Signed)
Left vm for verbal orders for patient (PT) is ok and for patient to keep appt.

## 2018-04-13 ENCOUNTER — Ambulatory Visit (INDEPENDENT_AMBULATORY_CARE_PROVIDER_SITE_OTHER): Payer: Medicare Other | Admitting: Orthopedic Surgery

## 2018-04-28 ENCOUNTER — Ambulatory Visit (INDEPENDENT_AMBULATORY_CARE_PROVIDER_SITE_OTHER): Payer: Medicare Other | Admitting: Orthopedic Surgery

## 2018-04-28 ENCOUNTER — Encounter (INDEPENDENT_AMBULATORY_CARE_PROVIDER_SITE_OTHER): Payer: Self-pay | Admitting: Orthopedic Surgery

## 2018-04-28 DIAGNOSIS — Z9889 Other specified postprocedural states: Secondary | ICD-10-CM

## 2018-04-28 DIAGNOSIS — Z89432 Acquired absence of left foot: Secondary | ICD-10-CM

## 2018-04-28 DIAGNOSIS — L97421 Non-pressure chronic ulcer of left heel and midfoot limited to breakdown of skin: Secondary | ICD-10-CM

## 2018-04-28 NOTE — Progress Notes (Signed)
Office Visit Note   Patient: [name]           Date of Birth: 11-27-1940           MRN: [id number] Visit Date: 04/28/2018              Requested by: [name], MD [address] [place], [place] [address] PCP: [name], MD  Chief Complaint  Patient presents with  . Right Hand - Follow-up  . Left Foot - Wound Check      HPI: Patient is a 77 year old gentleman who presents for 2 separate issues #1 he is status post carpal tunnel release on the right 3 months ago #2 he has a massive recurrent ulceration left foot status post transmetatarsal amputation.  Patient complains of odor and drainage.  Assessment & Plan: Visit Diagnoses:  1. Status post carpal tunnel release   2. Midfoot ulceration, left, limited to breakdown of skin ([place])   3. Status post transmetatarsal amputation of left foot ([place])     Plan: Patient is given a prescription for new extra-depth shoes custom orthotic spacer and carbon plate for the left foot for the transmetatarsal amputation with biotech.  The ulcer was debrided of skin and soft tissue there is no clinical signs of infection.  Will reevaluate at follow-up.  Follow-Up Instructions: Return in about 3 weeks (around 05/19/2018).   Ortho Exam  Patient is alert, oriented, no adenopathy, well-dressed, normal affect, normal respiratory effort. Examination patient has had excellent improvement with the symptoms from his carpal tunnel release there is no signs of infection he has full range of motion of his fingers with full sensation.  Examination the left foot he has a new large massive ulceration on the plantar aspect the left foot status post transmetatarsal amputation.  There is no swelling in the foot no ascending cellulitis.  After informed consent a 10 blade knife was used to debride the skin and soft tissue back to healthy viable granulation tissue.  Silver nitrate was used for hemostasis.  The ulcer is 4 x 5 cm and 5  mm deep.  Iodosorb and a dry dressing was applied.  Discussed the importance of protected weightbearing and using his walker.  Imaging: No results found. No images are attached to the encounter.  Labs: Lab Results  Component Value Date   HGBA1C 5.4 01/30/2018   HGBA1C 8.1 (H) 08/23/2014   HGBA1C 11.3 (H) 10/12/2012   ESRSEDRATE 34 (H) 12/17/2017   ESRSEDRATE 45 (H) 09/27/2017   ESRSEDRATE 75 (H) 07/28/2017   CRP 17.1 (H) 12/17/2017   CRP 1.8 07/28/2017   CRP 15.8 (H) 09/06/2014   REPTSTATUS 10/25/2013 FINAL 10/24/2013   CULT NO GROWTH Performed at [facility] 10/24/2013     Lab Results  Component Value Date   ALBUMIN 3.9 09/27/2017   ALBUMIN 3.7 06/24/2017   ALBUMIN 3.4 (L) 09/23/2014    There is no height or weight on file to calculate BMI.  Orders:  No orders of the defined types were placed in this encounter.  No orders of the defined types were placed in this encounter.    Procedures: No procedures performed  Clinical Data: No additional findings.  ROS:  All other systems negative, except as noted in the HPI. Review of Systems  Objective: Vital Signs: There were no vitals taken for this visit.  Specialty Comments:  No specialty comments available.  PMFS History: Patient Active Problem List   Diagnosis Date Noted  .  Rash and nonspecific skin eruption 12/16/2017  . Pain in right hand 09/11/2017  . Carpal tunnel syndrome, right upper limb 07/01/2017  . Arthritis of carpometacarpal [facility]) joint of left thumb 12/30/2016  . Midfoot ulceration, left, limited to breakdown of skin ([place]) 07/22/2016  . 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[place]) 09/23/2014  . Osteomyelitis of ankle or foot, left, acute ([place]) 08/23/2014  . Cellulitis 10/12/2012  . Chest pain 10/12/2012  . Tobacco abuse 10/12/2012  . ABSCESS, AXILLA, LEFT 02/23/2008  . 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 CHEST WALL PAIN, ACUTE 12/03/2007  . CANDIDIASIS, GLANS PENIS 09/16/2007    . HEADACHE 08/04/2007  . 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 Other and unspecified hyperlipidemia 03/05/2007  . GOUT 03/05/2007  . ERECTILE DYSFUNCTION 03/05/2007  . EXTERNAL HEMORRHOIDS 03/05/2007  . VENTRAL HERNIA 03/05/2007  . FATTY LIVER DISEASE 03/05/2007  . Osteoarthritis 03/05/2007  . HEMORRHOIDS, INTERNAL 03/17/2005  . COLONIC POLYPS, HX OF 03/17/2005   Past Medical History:  Diagnosis Date  . Arthritis   . Diabetic neuropathy ([place])   . High cholesterol   . Hypertension    denies  . Osteomyelitis (HCC)    Right great toe  . Peripheral vascular disease ([place])    diabetic  with osteomylitis  . Type II diabetes mellitus (HCC)     Family History  Problem Relation Age of Onset  . Diabetes Mellitus II Other   . Anesthesia problems Neg Hx     Past Surgical History:  Procedure Laterality Date  . AMPUTATION  07/23/2011   Procedure: AMPUTATION DIGIT;  Surgeon: Newt Minion, MD;  Location: [facility];  Service: Orthopedics;  Laterality: Right;  Right Great Toe Amputation  . AMPUTATION Right 09/18/2012   Procedure: Right Foot 2nd Ray Amputation;  Surgeon: Newt Minion, MD;  Location: [facility];  Service: Orthopedics;  Laterality: Right;  Right Foot 2nd Ray Amputation  . AMPUTATION Right 10/14/2012   Procedure: AMPUTATION FOOT;  Surgeon: Newt Minion, MD;  Location: [facility];  Service: Orthopedics;  Laterality: Right;  Right Foot Transmetatarsal Amputation  . AMPUTATION Left 09/23/2014   Procedure: Left Foot Transmetatarsal Amputation;  Surgeon: Newt Minion, MD;  Location: [facility];  Service: Orthopedics;  Laterality: Left;  . BACK SURGERY     lower  . CARPAL TUNNEL RELEASE Right 01/30/2018   Procedure: RIGHT CARPAL TUNNEL RELEASE;  Surgeon: Newt Minion, MD;  Location: [facility];  Service: Orthopedics;  Laterality: Right;  . CIRCUMCISION  2010  . COLONOSCOPY    . FOOT FRACTURE SURGERY Left 1970's   "broke  it playing football" (10/12/2012)  . HUMERUS FRACTURE SURGERY W/ IMPLANT Right 1960's   "put a plate in it" (1/66/0630)  . INCISION AND DRAINAGE OF WOUND Left 2006   "foot" (10/12/2012)  . LUMBAR [place] SURGERY  2008   Social History   Occupational History  . Not on file  Tobacco Use  . Smoking status: Current Every Day Smoker    Years: 12.00    Types: Pipe  . Smokeless tobacco: Never Used  Substance and Sexual Activity  . Alcohol use: No    Alcohol/week: 0.0 standard drinks  . Drug use: No  . Sexual activity: Not Currently

## 2018-05-19 ENCOUNTER — Ambulatory Visit (INDEPENDENT_AMBULATORY_CARE_PROVIDER_SITE_OTHER): Payer: Medicare Other | Admitting: Orthopedic Surgery

## 2018-05-28 ENCOUNTER — Inpatient Hospital Stay (HOSPITAL_COMMUNITY)
Admission: EM | Admit: 2018-05-28 | Discharge: 2018-05-30 | DRG: 312 | Disposition: A | Discharge status: Home / Self Care | Payer: Medicare Other | Attending: Internal Medicine | Admitting: Internal Medicine

## 2018-05-28 ENCOUNTER — Encounter (HOSPITAL_COMMUNITY): Payer: Self-pay | Admitting: Emergency Medicine

## 2018-05-28 ENCOUNTER — Inpatient Hospital Stay (HOSPITAL_COMMUNITY): Payer: Medicare Other

## 2018-05-28 ENCOUNTER — Emergency Department (HOSPITAL_COMMUNITY): Payer: Medicare Other

## 2018-05-28 ENCOUNTER — Other Ambulatory Visit: Payer: Self-pay

## 2018-05-28 DIAGNOSIS — E1151 Type 2 diabetes mellitus with diabetic peripheral angiopathy without gangrene: Secondary | ICD-10-CM | POA: Diagnosis present

## 2018-05-28 DIAGNOSIS — I444 Left anterior fascicular block: Secondary | ICD-10-CM | POA: Diagnosis not present

## 2018-05-28 DIAGNOSIS — R55 Syncope and collapse: Secondary | ICD-10-CM | POA: Diagnosis present

## 2018-05-28 DIAGNOSIS — I452 Bifascicular block: Secondary | ICD-10-CM | POA: Diagnosis not present

## 2018-05-28 DIAGNOSIS — F17210 Nicotine dependence, cigarettes, uncomplicated: Secondary | ICD-10-CM | POA: Diagnosis present

## 2018-05-28 DIAGNOSIS — I70209 Unspecified atherosclerosis of native arteries of extremities, unspecified extremity: Secondary | ICD-10-CM | POA: Diagnosis present

## 2018-05-28 DIAGNOSIS — Z833 Family history of diabetes mellitus: Secondary | ICD-10-CM | POA: Diagnosis not present

## 2018-05-28 DIAGNOSIS — Z89422 Acquired absence of other left toe(s): Secondary | ICD-10-CM

## 2018-05-28 DIAGNOSIS — I10 Essential (primary) hypertension: Secondary | ICD-10-CM | POA: Diagnosis present

## 2018-05-28 DIAGNOSIS — Z89421 Acquired absence of other right toe(s): Secondary | ICD-10-CM | POA: Diagnosis not present

## 2018-05-28 DIAGNOSIS — Z7984 Long term (current) use of oral hypoglycemic drugs: Secondary | ICD-10-CM

## 2018-05-28 DIAGNOSIS — I214 Non-ST elevation (NSTEMI) myocardial infarction: Secondary | ICD-10-CM | POA: Diagnosis not present

## 2018-05-28 DIAGNOSIS — E1140 Type 2 diabetes mellitus with diabetic neuropathy, unspecified: Secondary | ICD-10-CM | POA: Diagnosis present

## 2018-05-28 DIAGNOSIS — Z7982 Long term (current) use of aspirin: Secondary | ICD-10-CM | POA: Diagnosis not present

## 2018-05-28 DIAGNOSIS — K760 Fatty (change of) liver, not elsewhere classified: Secondary | ICD-10-CM | POA: Diagnosis not present

## 2018-05-28 DIAGNOSIS — E782 Mixed hyperlipidemia: Secondary | ICD-10-CM | POA: Diagnosis present

## 2018-05-28 DIAGNOSIS — Z720 Tobacco use: Secondary | ICD-10-CM | POA: Diagnosis present

## 2018-05-28 DIAGNOSIS — R7989 Other specified abnormal findings of blood chemistry: Secondary | ICD-10-CM | POA: Diagnosis not present

## 2018-05-28 DIAGNOSIS — Z79899 Other long term (current) drug therapy: Secondary | ICD-10-CM

## 2018-05-28 DIAGNOSIS — W19XXXA Unspecified fall, initial encounter: Secondary | ICD-10-CM | POA: Diagnosis not present

## 2018-05-28 LAB — GLUCOSE, CAPILLARY: Glucose-Capillary: 105 mg/dL — ABNORMAL HIGH (ref 70–99)

## 2018-05-28 LAB — CBC
HCT: 39.9 % (ref 39.0–52.0)
HCT: 36.5 % — ABNORMAL LOW (ref 39.0–52.0)
Hemoglobin: 12.1 g/dL — ABNORMAL LOW (ref 13.0–17.0)
Hemoglobin: 11.2 g/dL — ABNORMAL LOW (ref 13.0–17.0)
MCH: 27.4 pg (ref 26.0–34.0)
MCH: 27.3 pg (ref 26.0–34.0)
MCHC: 30.3 g/dL (ref 30.0–36.0)
MCHC: 30.7 g/dL (ref 30.0–36.0)
MCV: 90.3 fL (ref 80.0–100.0)
MCV: 89 fL (ref 80.0–100.0)
Platelets: 211 10*3/uL (ref 150–400)
Platelets: 208 10*3/uL (ref 150–400)
RBC: 4.42 MIL/uL (ref 4.22–5.81)
RBC: 4.1 MIL/uL — ABNORMAL LOW (ref 4.22–5.81)
RDW: 15.4 % (ref 11.5–15.5)
RDW: 15.3 % (ref 11.5–15.5)
WBC: 12.5 10*3/uL — ABNORMAL HIGH (ref 4.0–10.5)
WBC: 13.1 10*3/uL — ABNORMAL HIGH (ref 4.0–10.5)
nRBC: 0 % (ref 0.0–0.2)
nRBC: 0 % (ref 0.0–0.2)

## 2018-05-28 LAB — MAGNESIUM: Magnesium: 1.8 mg/dL (ref 1.7–2.4)

## 2018-05-28 LAB — HEPATIC FUNCTION PANEL
ALT: 12 U/L (ref 0–44)
AST: 19 U/L (ref 15–41)
Albumin: 3.5 g/dL (ref 3.5–5.0)
Alkaline Phosphatase: 41 U/L (ref 38–126)
Bilirubin, Direct: 0.2 mg/dL (ref 0.0–0.2)
Indirect Bilirubin: 0.7 mg/dL (ref 0.3–0.9)
Total Bilirubin: 0.9 mg/dL (ref 0.3–1.2)
Total Protein: 8.2 g/dL — ABNORMAL HIGH (ref 6.5–8.1)

## 2018-05-28 LAB — URINALYSIS, ROUTINE W REFLEX MICROSCOPIC
Bilirubin Urine: NEGATIVE
Glucose, UA: NEGATIVE mg/dL
Ketones, ur: NEGATIVE mg/dL
Leukocytes, UA: NEGATIVE
Nitrite: NEGATIVE
Protein, ur: 30 mg/dL — AB
Specific Gravity, Urine: 1.016 (ref 1.005–1.030)
pH: 5 (ref 5.0–8.0)

## 2018-05-28 LAB — I-STAT TROPONIN, ED: Troponin i, poc: 0.3 ng/mL (ref 0.00–0.08)

## 2018-05-28 LAB — BASIC METABOLIC PANEL
Anion gap: 11 (ref 5–15)
BUN: 14 mg/dL (ref 8–23)
CO2: 21 mmol/L — ABNORMAL LOW (ref 22–32)
Calcium: 9 mg/dL (ref 8.9–10.3)
Chloride: 105 mmol/L (ref 98–111)
Creatinine, Ser: 1.56 mg/dL — ABNORMAL HIGH (ref 0.61–1.24)
GFR calc Af Amer: 50 mL/min — ABNORMAL LOW (ref >60)
GFR calc non Af Amer: 43 mL/min — ABNORMAL LOW (ref >60)
Glucose, Bld: 116 mg/dL — ABNORMAL HIGH (ref 70–99)
Potassium: 4.5 mmol/L (ref 3.5–5.1)
Sodium: 137 mmol/L (ref 135–145)

## 2018-05-28 LAB — CBG MONITORING, ED: Glucose-Capillary: 102 mg/dL — ABNORMAL HIGH (ref 70–99)

## 2018-05-28 MED ORDER — HEPARIN (PORCINE) 25000 UT/250ML-% IV SOLN
1400.0000 [IU]/h | INTRAVENOUS | Status: DC
  Administered 2018-05-28: 1050 [IU]/h via INTRAVENOUS
  Administered 2018-05-29: 1300 [IU]/h via INTRAVENOUS
  Administered 2018-05-30: 1400 [IU]/h via INTRAVENOUS
  Filled 2018-05-28 (×3): qty 250

## 2018-05-28 MED ORDER — BRIMONIDINE TARTRATE 0.15 % OP SOLN
1.0000 [drp] | Freq: Two times a day (BID) | OPHTHALMIC | Status: DC
  Administered 2018-05-28 – 2018-05-30 (×3): 1 [drp] via OPHTHALMIC
  Filled 2018-05-28: qty 5

## 2018-05-28 MED ORDER — CYANOCOBALAMIN 500 MCG PO TABS
500.0000 ug | ORAL_TABLET | Freq: Every day | ORAL | Status: DC
  Administered 2018-05-29 – 2018-05-30 (×2): 500 ug via ORAL
  Filled 2018-05-28 (×2): qty 1

## 2018-05-28 MED ORDER — HEPARIN BOLUS VIA INFUSION
4000.0000 [IU] | Freq: Once | INTRAVENOUS | Status: AC
  Administered 2018-05-28: 4000 [IU] via INTRAVENOUS
  Filled 2018-05-28: qty 4000

## 2018-05-28 MED ORDER — LATANOPROST 0.005 % OP SOLN
1.0000 [drp] | Freq: Every day | OPHTHALMIC | Status: DC
  Administered 2018-05-28 – 2018-05-29 (×2): 1 [drp] via OPHTHALMIC
  Filled 2018-05-28: qty 2.5

## 2018-05-28 MED ORDER — ACETAMINOPHEN 500 MG PO TABS: 1000.0000 mg | ORAL_TABLET | Freq: Four times a day (QID) | ORAL | Status: DC | PRN

## 2018-05-28 MED ORDER — TIMOLOL MALEATE 0.5 % OP SOLN
1.0000 [drp] | Freq: Two times a day (BID) | OPHTHALMIC | Status: DC
  Administered 2018-05-28 – 2018-05-30 (×3): 1 [drp] via OPHTHALMIC
  Filled 2018-05-28: qty 5

## 2018-05-28 MED ORDER — INSULIN ASPART 100 UNIT/ML ~~LOC~~ SOLN: 0.0000 [IU] | Freq: Three times a day (TID) | SUBCUTANEOUS | Status: DC

## 2018-05-28 MED ORDER — NITROGLYCERIN 0.4 MG SL SUBL: 0.4000 mg | SUBLINGUAL_TABLET | SUBLINGUAL | Status: DC | PRN

## 2018-05-28 MED ORDER — SODIUM CHLORIDE 0.9 % IV BOLUS
500.0000 mL | Freq: Once | INTRAVENOUS | Status: AC
  Administered 2018-05-28: 500 mL via INTRAVENOUS

## 2018-05-28 MED ORDER — ACETAMINOPHEN 325 MG PO TABS
650.0000 mg | ORAL_TABLET | Freq: Once | ORAL | Status: AC
  Administered 2018-05-28: 650 mg via ORAL
  Filled 2018-05-28: qty 2

## 2018-05-28 MED ORDER — VITAMIN B-6 50 MG PO TABS
50.0000 mg | ORAL_TABLET | Freq: Two times a day (BID) | ORAL | Status: DC
  Administered 2018-05-28 – 2018-05-30 (×4): 50 mg via ORAL
  Filled 2018-05-28 (×5): qty 1

## 2018-05-28 MED ORDER — VITAMIN B-1 100 MG PO TABS
100.0000 mg | ORAL_TABLET | Freq: Every day | ORAL | Status: DC
  Administered 2018-05-29 – 2018-05-30 (×2): 100 mg via ORAL
  Filled 2018-05-28 (×2): qty 1

## 2018-05-28 MED ORDER — ACETAMINOPHEN 325 MG PO TABS: 650.0000 mg | ORAL_TABLET | ORAL | Status: DC | PRN

## 2018-05-28 MED ORDER — NICOTINE 21 MG/24HR TD PT24
21.0000 mg | MEDICATED_PATCH | Freq: Every day | TRANSDERMAL | Status: DC
  Administered 2018-05-28 – 2018-05-30 (×3): 21 mg via TRANSDERMAL
  Filled 2018-05-28 (×3): qty 1

## 2018-05-28 MED ORDER — ENOXAPARIN SODIUM 100 MG/ML ~~LOC~~ SOLN: 1.0000 mg/kg | Freq: Two times a day (BID) | SUBCUTANEOUS | Status: DC

## 2018-05-28 MED ORDER — ONDANSETRON HCL 4 MG/2ML IJ SOLN: 4.0000 mg | Freq: Four times a day (QID) | INTRAMUSCULAR | Status: DC | PRN

## 2018-05-28 MED ORDER — METOPROLOL TARTRATE 25 MG PO TABS
25.0000 mg | ORAL_TABLET | Freq: Two times a day (BID) | ORAL | Status: DC
  Administered 2018-05-28 – 2018-05-30 (×4): 25 mg via ORAL
  Filled 2018-05-28 (×4): qty 1

## 2018-05-28 MED ORDER — INSULIN ASPART 100 UNIT/ML ~~LOC~~ SOLN: 0.0000 [IU] | Freq: Every day | SUBCUTANEOUS | Status: DC

## 2018-05-28 NOTE — ED Provider Notes (Signed)
[facility] EMERGENCY DEPARTMENT Provider Note   CSN: [id number] Arrival date & time: 05/28/18  1432     History   Chief Complaint Chief Complaint  Patient presents with  . Fall  . Weakness    HPI [name] is a 77 y.o. male.  The history is provided by the patient.  Loss of Consciousness   This is a new problem. The current episode started 1 to 2 hours ago. The problem occurs rarely. The problem has been resolved. He lost consciousness for a period of less than one minute. The problem is associated with normal activity. Associated symptoms include weakness. Pertinent negatives include abdominal pain, back pain, bladder incontinence, bowel incontinence, chest pain, clumsiness, confusion, congestion, diaphoresis, dizziness, fever, focal sensory loss, focal weakness, headaches, light-headedness, malaise/fatigue, nausea, palpitations, seizures, slurred speech, vertigo, visual change and vomiting. He has tried bed rest for the symptoms. The treatment provided mild relief. His past medical history is significant for DM and HTN. His past medical history does not include CAD.    Past Medical History:  Diagnosis Date  . Arthritis   . Diabetic neuropathy ([place])   . High cholesterol   . Hypertension    denies  . Osteomyelitis (HCC)    Right great toe  . Peripheral vascular disease ([place])    diabetic  with osteomylitis  . Type II diabetes mellitus [facility])     Patient Active Problem List   Diagnosis Date Noted  . NSTEMI (non-ST elevated myocardial infarction) ([place]) 05/28/2018  . Syncope 05/28/2018  . HTN (hypertension) 05/28/2018  . Rash and nonspecific skin eruption 12/16/2017  . Pain in right hand 09/11/2017  . Carpal tunnel syndrome, right upper limb 07/01/2017  . Arthritis of carpometacarpal [facility]) joint of left thumb 12/30/2016  . Midfoot ulceration, left, limited to breakdown of skin ([place]) 07/22/2016  . 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[place])  09/23/2014  . Osteomyelitis of ankle or foot, left, acute ([place]) 08/23/2014  . Cellulitis 10/12/2012  . Chest pain 10/12/2012  . Tobacco abuse 10/12/2012  . ABSCESS, AXILLA, LEFT 02/23/2008  . 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 CHEST WALL PAIN, ACUTE 12/03/2007  . CANDIDIASIS, GLANS PENIS 09/16/2007  . HEADACHE 08/04/2007  . 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 Other and unspecified hyperlipidemia 03/05/2007  . GOUT 03/05/2007  . ERECTILE DYSFUNCTION 03/05/2007  . EXTERNAL HEMORRHOIDS 03/05/2007  . VENTRAL HERNIA 03/05/2007  . FATTY LIVER DISEASE 03/05/2007  . Osteoarthritis 03/05/2007  . HEMORRHOIDS, INTERNAL 03/17/2005  . COLONIC POLYPS, HX OF 03/17/2005    Past Surgical History:  Procedure Laterality Date  . AMPUTATION  07/23/2011   Procedure: AMPUTATION DIGIT;  Surgeon: Newt Minion, MD;  Location: [facility];  Service: Orthopedics;  Laterality: Right;  Right Great Toe Amputation  . AMPUTATION Right 09/18/2012   Procedure: Right Foot 2nd Ray Amputation;  Surgeon: Newt Minion, MD;  Location: [facility];  Service: Orthopedics;  Laterality: Right;  Right Foot 2nd Ray Amputation  . AMPUTATION Right 10/14/2012   Procedure: AMPUTATION FOOT;  Surgeon: Newt Minion, MD;  Location: [facility];  Service: Orthopedics;  Laterality: Right;  Right Foot Transmetatarsal Amputation  . AMPUTATION Left 09/23/2014   Procedure: Left Foot Transmetatarsal Amputation;  Surgeon: Newt Minion, MD;  Location: [facility];  Service: Orthopedics;  Laterality: Left;  . BACK SURGERY     lower  . CARPAL TUNNEL RELEASE Right 01/30/2018  Procedure: RIGHT CARPAL TUNNEL RELEASE;  Surgeon: Newt Minion, MD;  Location: [facility];  Service: Orthopedics;  Laterality: Right;  . CIRCUMCISION  2010  . COLONOSCOPY    . FOOT FRACTURE SURGERY Left 1970's   "broke it playing football" (10/12/2012)  . HUMERUS FRACTURE SURGERY W/ IMPLANT Right 1960's   "put a  plate in it" (8/36/6294)  . INCISION AND DRAINAGE OF WOUND Left 2006   "foot" (10/12/2012)  . [place] SURGERY  2008        Home Medications    Prior to Admission medications   Medication Sig Start Date End Date Taking? Authorizing Provider  acetaminophen (TYLENOL) 500 MG tablet Take 1,000 mg by mouth every 6 (six) hours as needed for mild pain.   Yes [provider]  ALPHAGAN P 0.1 % SOLN Place 1 drop into both eyes 2 (two) times daily.  12/31/16  Yes [provider]  latanoprost (XALATAN) 0.005 % ophthalmic solution Place 1 drop into both eyes at bedtime.  09/12/14  Yes [provider]  metFORMIN (GLUCOPHAGE) 1000 MG tablet Take 1,000 mg by mouth 2 (two) times daily.    Yes [provider]  pyridOXINE (VITAMIN B-6) 50 MG tablet Take 50 mg by mouth 2 (two) times daily.    Yes [provider]  thiamine (VITAMIN B-1) 100 MG tablet Take 100 mg by mouth daily.   Yes [provider]  timolol (TIMOPTIC) 0.5 % ophthalmic solution Place 1 drop into both eyes 2 (two) times daily. 05/19/17  Yes [provider]  vitamin B-12 (CYANOCOBALAMIN) 500 MCG tablet Take 500 mcg by mouth daily.   Yes [provider]  oxyCODONE-acetaminophen (PERCOCET/ROXICET) 5-325 MG tablet Take 1 tablet by mouth every 4 (four) hours as needed for severe pain. Patient not taking: Reported on 05/28/2018 01/30/18   Newt Minion, MD    Family History Family History  Problem Relation Age of Onset  . Diabetes Mellitus II Other   . Anesthesia problems Neg Hx     Social History Social History   Tobacco Use  . Smoking status: Current Every Day Smoker    Years: 12.00    Types: Pipe  . Smokeless tobacco: Never Used  Substance Use Topics  . Alcohol use: No    Alcohol/week: 0.0 standard drinks  . Drug use: No     Allergies   Patient has no known allergies.   Review of Systems Review of Systems  Constitutional: Negative for chills,  diaphoresis, fever and malaise/fatigue.  HENT: Negative for congestion, ear pain and sore throat.   Eyes: Negative for pain and visual disturbance.  Respiratory: Negative for cough and shortness of breath.   Cardiovascular: Positive for syncope. Negative for chest pain and palpitations.  Gastrointestinal: Negative for abdominal pain, bowel incontinence, nausea and vomiting.  Genitourinary: Negative for bladder incontinence, dysuria and hematuria.  Musculoskeletal: Negative for arthralgias and back pain.  Skin: Negative for color change and rash.  Neurological: Positive for syncope and weakness. Negative for dizziness, vertigo, tremors, focal weakness, seizures, facial asymmetry, speech difficulty, light-headedness, numbness and headaches.  Psychiatric/Behavioral: Negative for confusion.  All other systems reviewed and are negative.    Physical Exam Updated Vital Signs  ED Triage Vitals  Enc Vitals Group     BP 05/28/18 1433 (!) 107/49     Pulse Rate 05/28/18 1434 75     Resp 05/28/18 1434 (!) 23     Temp --      Temp  src --      SpO2 05/28/18 1433 98 %     Weight 05/28/18 1440 185 lb (83.9 kg)     Height --      Head Circumference --      Peak Flow --      Pain Score --      Pain Loc --      Pain Edu? --      Excl. in [place]? --     Physical Exam Vitals signs and nursing note reviewed.  Constitutional:      Appearance: He is well-developed. He is not ill-appearing or diaphoretic.  HENT:     Head: Normocephalic and atraumatic.     Nose: Nose normal.     Mouth/Throat:     Mouth: Mucous membranes are dry.  Eyes:     Extraocular Movements: Extraocular movements intact.     Conjunctiva/sclera: Conjunctivae normal.     Pupils: Pupils are equal, round, and reactive to light.  Neck:     Musculoskeletal: Normal range of motion and neck supple.  Cardiovascular:     Rate and Rhythm: Normal rate and regular rhythm.     Pulses: Normal pulses.     Heart sounds: Normal heart  sounds. No murmur.  Pulmonary:     Effort: Pulmonary effort is normal. No respiratory distress.     Breath sounds: Normal breath sounds.  Abdominal:     Palpations: Abdomen is soft.     Tenderness: There is no abdominal tenderness.  Musculoskeletal: Normal range of motion.        General: No swelling or tenderness.  Skin:    General: Skin is warm and dry.     Capillary Refill: Capillary refill takes less than 2 seconds.  Neurological:     General: No focal deficit present.     Mental Status: He is alert and oriented to person, place, and time. Mental status is at baseline.     Cranial Nerves: No cranial nerve deficit.     Sensory: No sensory deficit.     Motor: No weakness.     Coordination: Coordination normal.     Gait: Gait normal.  Psychiatric:        Mood and Affect: Mood normal.      ED Treatments / Results  Labs (all labs ordered are listed, but only abnormal results are displayed) Labs Reviewed  BASIC METABOLIC PANEL - Abnormal; Notable for the following components:      Result Value   CO2 21 (*)    Glucose, Bld 116 (*)    Creatinine, Ser 1.56 (*)    GFR calc non Af Amer 43 (*)    GFR calc Af Amer 50 (*)    All other components within normal limits  CBC - Abnormal; Notable for the following components:   WBC 12.5 (*)    Hemoglobin 12.1 (*)    All other components within normal limits  URINALYSIS, ROUTINE W REFLEX MICROSCOPIC - Abnormal; Notable for the following components:   Hgb urine dipstick SMALL (*)    Protein, ur 30 (*)    Bacteria, UA RARE (*)    All other components within normal limits  HEPATIC FUNCTION PANEL - Abnormal; Notable for the following components:   Total Protein 8.2 (*)    All other components within normal limits  CBG MONITORING, ED - Abnormal; Notable for the following components:   Glucose-Capillary 102 (*)    All other components within normal limits  I-STAT  TROPONIN, ED - Abnormal; Notable for the following components:   Troponin  i, poc 0.30 (*)    All other components within normal limits  MAGNESIUM  HEPARIN LEVEL (UNFRACTIONATED)  CBC    EKG EKG Interpretation  Date/Time:  Thursday May 28 2018 14:33:48 EST Ventricular Rate:  74 PR Interval:    QRS Duration: 135 QT Interval:  503 QTC Calculation: 559 R Axis:   -46 Text Interpretation:  Sinus rhythm RBBB and LAFB No significant change since last tracing Confirmed by Lennice Sites [phone]) on 05/28/2018 3:09:07 PM   Radiology Ct Head Wo Contrast  Result Date: 05/28/2018 CLINICAL DATA:  Weakness today with falling. EXAM: CT HEAD WITHOUT CONTRAST TECHNIQUE: Contiguous axial images were obtained from the base of the skull through the vertex without intravenous contrast. COMPARISON:  MRI and CT 06/24/2017 FINDINGS: Brain: Age related atrophy. No evidence of old or acute focal infarction, mass lesion, hemorrhage, hydrocephalus or extra-axial collection. Vascular: There is atherosclerotic calcification of the major vessels at the base of the brain. Skull: No acute finding. Hyperostosis frontalis interna. Sinuses/Orbits: Clear/normal. Glaucoma surgery on the left. Other: None IMPRESSION: No acute or traumatic finding. Age related atrophy. Electronically Signed   By: Nelson Chimes M.D.   On: 05/28/2018 15:52    Procedures .Critical Care Performed by: Lennice Sites, DO Authorized by: Lennice Sites, DO   Critical care provider statement:    Critical care time (minutes):  35   Critical care was necessary to treat or prevent imminent or life-threatening deterioration of the following conditions:  Cardiac failure   Critical care was time spent personally by me on the following activities:  Development of treatment plan with patient or surrogate, discussions with consultants, discussions with primary provider, evaluation of patient's response to treatment, examination of patient, obtaining history from patient or surrogate, ordering and performing treatments and  interventions, ordering and review of laboratory studies, ordering and review of radiographic studies, pulse oximetry, re-evaluation of patient's condition and review of old charts   I assumed direction of critical care for this patient from another provider in my specialty: no     (including critical care time)  Medications Ordered in ED Medications  heparin bolus via infusion 4,000 Units (has no administration in time range)  heparin ADULT infusion 100 units/mL (25000 units/254mL sodium chloride 0.45%) (has no administration in time range)  sodium chloride 0.9 % bolus 500 mL (0 mLs Intravenous Stopped 05/28/18 1751)  acetaminophen (TYLENOL) tablet 650 mg (650 mg Oral Given 05/28/18 1820)     Initial Impression / Assessment and Plan / ED Course  I have reviewed the triage vital signs and the nursing notes.  Pertinent labs & imaging results that were available during my care of the patient were reviewed by me and considered in my medical decision making (see chart for details).     [name] is a 77 year old male with history of high cholesterol, diabetes, hypertension, peripheral vascular disease who presents to the ED after syncopal event.  Patient with normal vitals.  No fever.  Patient states that he was doing his normal activity and while walking across the room in his house he all of a sudden passed out.  He does not think he hit his head.  Has no headache, neck pain.  Patient lives by himself.  Has help with a family member with food and other general daily activities.  He is overall asymptomatic upon my evaluation.  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  EKG is overall reassuring except there is slightly prolonged QTC.  No new ischemic changes.  EKG fairly unchanged from prior except for QTC.  Patient denies any chest pain, shortness of breath.  Exam unremarkable.  Neurologically patient is intact.  Will get labs including troponin, chest x-ray,  head CT.  Will likely need admission for syncope work-up.  Concern for possible arrhythmia.  Patient with no significant anemia, electrolyte abnormality, kidney injury.  Head CT unremarkable.  Troponin elevated.  Concern for non-ST elevation MI.  Will start heparin bolus and drip.  Cardiology made aware and patient to be admitted to hospitalist service for further care.  Remained hemodynamically stable throughout my care.  This chart was dictated using voice recognition software.  Despite best efforts to proofread,  errors can occur which can change the documentation meaning.   Final Clinical Impressions(s) / ED Diagnoses   Final diagnoses:  NSTEMI (non-ST elevated myocardial infarction) [facility])    ED Discharge Orders    None       Lennice Sites, DO 05/28/18 1822

## 2018-05-28 NOTE — ED Notes (Signed)
Patient transported to X-ray 

## 2018-05-28 NOTE — ED Triage Notes (Signed)
Pt in from home with generalized weakness x few days per EMS. Pt states he was walking today, just got weak and fell onto his floor around 1pm, landing on the R side. Denies any thinners, head or neck pain. Pt recalls entire event, a&ox4, MAE's equally.

## 2018-05-28 NOTE — H&P (Signed)
History and Physical   [place] Monds FXT:[id number] DOB: 1940-09-14 DOA: 05/28/2018  Referring MD/NP/PA: Dr. Ronnald Nian  PCP: [name], MD   Outpatient Specialists: None  Patient coming from: Home  Chief Complaint: Passing out  HPI: [name] is a 77 y.o. male with medical history significant of diabetes, hyperlipidemia, hypertension, osteoarthritis, tobacco abuse, peripheral vascular disease and previous osteomyelitis who came to the ER after he passed out at home.  Patient lives alone and was down and out for 1 to 2 minutes.  He was able to come around and called EMS.  Patient was brought to the ER where he is fully conscious at the moment.  He had no recollection of what happened at the time.  Denied any chest pain.  Denied any dizziness at the time.  He felt weak however.  Patient was evaluated and found to have elevated troponin with normal EKG.  Cardiology consulted and suggested admission for rule out MI as well as work-up.  ED Course: Temperature is 98.6 blood pressure 101/53 with pulse 76 respirate 24 oxygen sat 100% room air.  White count is 5 hemoglobin 12.1 and platelets 211.  Creatinine is 1.56 otherwise electrolytes are within normal with glucose of 116.  Initial troponin is 0.30.  Urinalysis is negative.  Head CT without contrast is negative.  Due to patient's elevated troponin cardiology consulted.  At this point recommended admission for non-ST elevation MI.  If enzymes keep trending upwards they will be consulted.  Review of Systems: As per HPI otherwise 10 point review of systems negative.    Past Medical History:  Diagnosis Date  . Arthritis   . Diabetic neuropathy ([place])   . High cholesterol   . Hypertension    denies  . Osteomyelitis (HCC)    Right great toe  . Peripheral vascular disease ([place])    diabetic  with osteomylitis  . Type II diabetes mellitus ([place])     Past Surgical History:  Procedure Laterality Date  . AMPUTATION  07/23/2011   Procedure: AMPUTATION DIGIT;  Surgeon: Newt Minion, MD;  Location: [facility];  Service: Orthopedics;  Laterality: Right;  Right Great Toe Amputation  . AMPUTATION Right 09/18/2012   Procedure: Right Foot 2nd Ray Amputation;  Surgeon: Newt Minion, MD;  Location: [facility];  Service: Orthopedics;  Laterality: Right;  Right Foot 2nd Ray Amputation  . AMPUTATION Right 10/14/2012   Procedure: AMPUTATION FOOT;  Surgeon: Newt Minion, MD;  Location: [facility];  Service: Orthopedics;  Laterality: Right;  Right Foot Transmetatarsal Amputation  . AMPUTATION Left 09/23/2014   Procedure: Left Foot Transmetatarsal Amputation;  Surgeon: Newt Minion, MD;  Location: [facility];  Service: Orthopedics;  Laterality: Left;  . BACK SURGERY     lower  . CARPAL TUNNEL RELEASE Right 01/30/2018   Procedure: RIGHT CARPAL TUNNEL RELEASE;  Surgeon: Newt Minion, MD;  Location: [facility];  Service: Orthopedics;  Laterality: Right;  . CIRCUMCISION  2010  . COLONOSCOPY    . FOOT FRACTURE SURGERY Left 1970's   "broke it playing football" (10/12/2012)  . HUMERUS FRACTURE SURGERY W/ IMPLANT Right 1960's   "put a plate in it" (2/99/2426)  . INCISION AND DRAINAGE OF WOUND Left 2006   "foot" (10/12/2012)  . [place] SURGERY  2008     reports that he has been smoking pipe. He has smoked for the past 12.00 years. He has never used smokeless tobacco. He reports that he does not drink alcohol or use  drugs.  No Known Allergies  Family History  Problem Relation Age of Onset  . Diabetes Mellitus II Other   . Anesthesia problems Neg Hx      Prior to Admission medications   Medication Sig Start Date End Date Taking? Authorizing Provider  acetaminophen (TYLENOL) 500 MG tablet Take 1,000 mg by mouth every 6 (six) hours as needed for mild pain.   Yes [provider]  ALPHAGAN P 0.1 % SOLN Place 1 drop into both eyes 2 (two) times daily.  12/31/16  Yes [provider]  latanoprost (XALATAN) 0.005 % ophthalmic solution Place 1  drop into both eyes at bedtime.  09/12/14  Yes [provider]  metFORMIN (GLUCOPHAGE) 1000 MG tablet Take 1,000 mg by mouth 2 (two) times daily.    Yes [provider]  pyridOXINE (VITAMIN B-6) 50 MG tablet Take 50 mg by mouth 2 (two) times daily.    Yes [provider]  thiamine (VITAMIN B-1) 100 MG tablet Take 100 mg by mouth daily.   Yes [provider]  timolol (TIMOPTIC) 0.5 % ophthalmic solution Place 1 drop into both eyes 2 (two) times daily. 05/19/17  Yes [provider]  vitamin B-12 (CYANOCOBALAMIN) 500 MCG tablet Take 500 mcg by mouth daily.   Yes [provider]  oxyCODONE-acetaminophen (PERCOCET/ROXICET) 5-325 MG tablet Take 1 tablet by mouth every 4 (four) hours as needed for severe pain. Patient not taking: Reported on 05/28/2018 01/30/18   Newt Minion, MD    Physical Exam: Vitals:   05/28/18 1600 05/28/18 1615 05/28/18 1630 05/28/18 1803  BP: (!) 104/51 (!) 104/55 (!) 110/53 (!) 111/59  Pulse: 69 69 72 76  Resp: (!) 24 18 20 15   SpO2: 98% 99% 100% 99%  Weight:          Constitutional: NAD, calm, comfortable Vitals:   05/28/18 1600 05/28/18 1615 05/28/18 1630 05/28/18 1803  BP: (!) 104/51 (!) 104/55 (!) 110/53 (!) 111/59  Pulse: 69 69 72 76  Resp: (!) 24 18 20 15   SpO2: 98% 99% 100% 99%  Weight:       Eyes: PERRL, lids and conjunctivae normal ENMT: Mucous membranes are moist. Posterior pharynx clear of any exudate or lesions.Normal dentition.  Neck: normal, supple, no masses, no thyromegaly Respiratory: clear to auscultation bilaterally, no wheezing, no crackles. Normal respiratory effort. No accessory muscle use.  Cardiovascular: Regular rate and rhythm, no murmurs / rubs / gallops. No extremity edema. 2+ pedal pulses. No carotid bruits.  Abdomen: no tenderness, no masses palpated. No hepatosplenomegaly. Bowel sounds positive.  Musculoskeletal: no clubbing / cyanosis. No joint deformity upper and lower  extremities. Good ROM, no contractures. Normal muscle tone.  Skin: no rashes, lesions, ulcers. No induration Neurologic: CN 2-12 grossly intact. Sensation intact, DTR normal. Strength 5/5 in all 4.  Psychiatric: Normal judgment and insight. Alert and oriented x 3. Normal mood.     Labs on Admission: I have personally reviewed following labs and imaging studies  CBC: Recent Labs  Lab 05/28/18 1445  WBC 12.5*  HGB 12.1*  HCT 39.9  MCV 90.3  PLT 702   Basic Metabolic Panel: Recent Labs  Lab 05/28/18 1445 05/28/18 1513  NA 137  --   K 4.5  --   CL 105  --   CO2 21*  --   GLUCOSE 116*  --   BUN 14  --   CREATININE 1.56*  --   CALCIUM 9.0  --  MG  --  1.8   GFR: Estimated Creatinine Clearance: 46.2 mL/min (A) (by C-G formula based on SCr of 1.56 mg/dL (H)). Liver Function Tests: Recent Labs  Lab 05/28/18 1513  AST 19  ALT 12  ALKPHOS 41  BILITOT 0.9  PROT 8.2*  ALBUMIN 3.5   No results for input(s): LIPASE, AMYLASE in the last 168 hours. No results for input(s): AMMONIA in the last 168 hours. Coagulation Profile: No results for input(s): INR, PROTIME in the last 168 hours. Cardiac Enzymes: No results for input(s): CKTOTAL, CKMB, CKMBINDEX, TROPONINI in the last 168 hours. BNP (last 3 results) No results for input(s): PROBNP in the last 8760 hours. HbA1C: No results for input(s): HGBA1C in the last 72 hours. CBG: Recent Labs  Lab 05/28/18 1500  GLUCAP 102*   Lipid Profile: No results for input(s): CHOL, HDL, LDLCALC, TRIG, CHOLHDL, LDLDIRECT in the last 72 hours. Thyroid Function Tests: No results for input(s): TSH, T4TOTAL, FREET4, T3FREE, THYROIDAB in the last 72 hours. Anemia Panel: No results for input(s): VITAMINB12, FOLATE, FERRITIN, TIBC, IRON, RETICCTPCT in the last 72 hours. Urine analysis:    Component Value Date/Time   COLORURINE YELLOW 05/28/2018 [place] 05/28/2018 1445   LABSPEC 1.016 05/28/2018 1445   PHURINE 5.0  05/28/2018 1445   GLUCOSEU NEGATIVE 05/28/2018 1445   HGBUR SMALL (A) 05/28/2018 1445   HGBUR trace-intact 09/16/2007 1115   BILIRUBINUR NEGATIVE 05/28/2018 1445   KETONESUR NEGATIVE 05/28/2018 1445   PROTEINUR 30 (A) 05/28/2018 1445   UROBILINOGEN 1.0 10/24/2013 1220   NITRITE NEGATIVE 05/28/2018 1445   LEUKOCYTESUR NEGATIVE 05/28/2018 1445   Sepsis Labs: @LABRCNTIP (procalcitonin:4,lacticidven:4) )No results found for this or any previous visit (from the past 240 hour(s)).   Radiological Exams on Admission: Ct Head Wo Contrast  Result Date: 05/28/2018 CLINICAL DATA:  Weakness today with falling. EXAM: CT HEAD WITHOUT CONTRAST TECHNIQUE: Contiguous axial images were obtained from the base of the skull through the vertex without intravenous contrast. COMPARISON:  MRI and CT 06/24/2017 FINDINGS: Brain: Age related atrophy. No evidence of old or acute focal infarction, mass lesion, hemorrhage, hydrocephalus or extra-axial collection. Vascular: There is atherosclerotic calcification of the major vessels at the base of the brain. Skull: No acute finding. Hyperostosis frontalis interna. Sinuses/Orbits: Clear/normal. Glaucoma surgery on the left. Other: None IMPRESSION: No acute or traumatic finding. Age related atrophy. Electronically Signed   By: Nelson Chimes M.D.   On: 05/28/2018 15:52    EKG: Independently reviewed.  It shows normal sinus rhythm with a rate of 74, right bundle branch block and left anterior fascicular block.  No significant ST changes.  EKG unchanged from previous EKG from September 27, 2017.  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  Patient has no classic cardiac symptoms.  He is at risk however with diabetes  hypertension tobacco abuse abnormal EKG although not new as well as hyperlipidemia.  We will admit the patient initiated aspirin and heparin drip and admit to progressive care unit.  Nitroglycerin as needed.  Blood pressure control.  Morphine as needed.  If enzymes keep trending upwards cardiology will be consulted for possible cardiac catheterization.  Otherwise patient may require cardiac stress testing for risk stratification.  #2  syncopal episode: This could be angina equivalent.  It could also be related to cardiac arrhythmias, vasovagal or transient hypotension or even hypoglycemia.  His blood sugar was reportedly normal when he was brought in.  He has history of peripheral vascular disease so we will check carotid Dopplers just in case this may cause symptoms.  #3 diabetes: He is on metformin which we will hold.  Will initiate sliding scale insulin.  #4 hypertension: Blood pressure at this point is controlled.  Continue treatment.  #5 hyperlipidemia: Continue statin.  #6 tobacco abuse: Counseling provided.  Will initiate nicotine patch   DVT prophylaxis: Heparin drip Code Status: Full code Family Communication: No family available.  Niece will be coming later Disposition Plan: Home Consults called: Cardiology called by ER.  Should be called in the morning if needed. Admission status: Inpatient  Severity of Illness: The appropriate patient status for this patient is INPATIENT. Inpatient status is judged to be reasonable and necessary in order to provide the required intensity of service to ensure the patient's safety. The patient's presenting symptoms, physical exam findings, and initial radiographic and laboratory data in the context of their chronic comorbidities is felt to place them at high risk for further clinical deterioration. Furthermore, it is not anticipated that the patient will be medically stable for discharge from the hospital within 2 midnights of admission. The following  factors support the patient status of inpatient.   " The patient's presenting symptoms include syncopal episode. " The worrisome physical exam findings include no significant arrhythmias. " The initial radiographic and laboratory data are worrisome because of troponin of 0.30. " The chronic co-morbidities include diabetes, hypertension, hyperlipidemia and tobacco abuse.   * I certify that at the point of admission it is my clinical judgment that the patient will require inpatient hospital care spanning beyond 2 midnights from the point of admission due to high intensity of service, high risk for further deterioration and high frequency of surveillance required.Barbette Merino MD [facility] Pager [phone]  If 7PM-7AM, please contact night-coverage [email or web address] Password [facility]  05/28/2018, 6:27 PM

## 2018-05-28 NOTE — ED Notes (Signed)
Pt denies CP or SOB at this time.

## 2018-05-28 NOTE — Progress Notes (Signed)
ANTICOAGULATION CONSULT NOTE - Initial Consult  Pharmacy Consult for heparin Indication: chest pain/ACS  Patient Measurements: Weight: 185 lb (83.9 kg) Heparin Dosing Weight: 83.9 kg  Vital Signs: BP: 111/59 (12/12 1803) Pulse Rate: 76 (12/12 1803)  Labs: Recent Labs    05/28/18 1445  HGB 12.1*  HCT 39.9  PLT 211  CREATININE 1.56*    Estimated Creatinine Clearance: 46.2 mL/min (A) (by C-G formula based on SCr of 1.56 mg/dL (H)).   Medical History: Past Medical History:  Diagnosis Date  . Arthritis   . Diabetic neuropathy ([place])   . High cholesterol   . Hypertension    denies  . Osteomyelitis (HCC)    Right great toe  . Peripheral vascular disease ([place])    diabetic  with osteomylitis  . Type II diabetes mellitus (HCC)     Assessment: Patient with syncope. Starting heparin for rule out NSTEMI. hgb 12.1, plts wnl, SCr 1.5.  Goal of Therapy:  Heparin level 0.3-0.7 units/ml Monitor platelets by anticoagulation protocol: Yes    Plan:  -Heparin bolus 4000 units x 1 then 1050 units/hr -Daily HL, CBC -First level with AM labs    Harvel Quale 05/28/2018,6:16 PM

## 2018-05-29 ENCOUNTER — Inpatient Hospital Stay (HOSPITAL_COMMUNITY): Payer: Medicare Other

## 2018-05-29 DIAGNOSIS — I370 Nonrheumatic pulmonary valve stenosis: Secondary | ICD-10-CM

## 2018-05-29 DIAGNOSIS — I214 Non-ST elevation (NSTEMI) myocardial infarction: Secondary | ICD-10-CM

## 2018-05-29 DIAGNOSIS — R55 Syncope and collapse: Secondary | ICD-10-CM | POA: Diagnosis not present

## 2018-05-29 LAB — CBC
HCT: 36.9 % — ABNORMAL LOW (ref 39.0–52.0)
Hemoglobin: 11.5 g/dL — ABNORMAL LOW (ref 13.0–17.0)
MCH: 27.6 pg (ref 26.0–34.0)
MCHC: 31.2 g/dL (ref 30.0–36.0)
MCV: 88.7 fL (ref 80.0–100.0)
Platelets: 184 10*3/uL (ref 150–400)
RBC: 4.16 MIL/uL — ABNORMAL LOW (ref 4.22–5.81)
RDW: 15.4 % (ref 11.5–15.5)
WBC: 11.8 10*3/uL — ABNORMAL HIGH (ref 4.0–10.5)
nRBC: 0 % (ref 0.0–0.2)

## 2018-05-29 LAB — GLUCOSE, CAPILLARY
Glucose-Capillary: 81 mg/dL (ref 70–99)
Glucose-Capillary: 111 mg/dL — ABNORMAL HIGH (ref 70–99)
Glucose-Capillary: 73 mg/dL (ref 70–99)
Glucose-Capillary: 111 mg/dL — ABNORMAL HIGH (ref 70–99)

## 2018-05-29 LAB — CREATININE, SERUM
Creatinine, Ser: 1.57 mg/dL — ABNORMAL HIGH (ref 0.61–1.24)
GFR calc Af Amer: 49 mL/min — ABNORMAL LOW (ref >60)
GFR calc non Af Amer: 42 mL/min — ABNORMAL LOW (ref >60)

## 2018-05-29 LAB — HEMOGLOBIN A1C
Hgb A1c MFr Bld: 5.2 % (ref 4.8–5.6)
Mean Plasma Glucose: 102.54 mg/dL

## 2018-05-29 LAB — ECHOCARDIOGRAM COMPLETE
Height: 73 in
Weight: 3035.2 oz

## 2018-05-29 LAB — HEPARIN LEVEL (UNFRACTIONATED)
Heparin Unfractionated: 0.1 IU/mL — ABNORMAL LOW (ref 0.30–0.70)
Heparin Unfractionated: 0.24 IU/mL — ABNORMAL LOW (ref 0.30–0.70)

## 2018-05-29 LAB — TROPONIN I
Troponin I: 0.42 ng/mL (ref <0.03)
Troponin I: 0.39 ng/mL (ref <0.03)
Troponin I: 0.34 ng/mL (ref <0.03)

## 2018-05-29 MED ORDER — ASPIRIN EC 81 MG PO TBEC
81.0000 mg | DELAYED_RELEASE_TABLET | Freq: Every day | ORAL | Status: DC
  Administered 2018-05-29 – 2018-05-30 (×2): 81 mg via ORAL
  Filled 2018-05-29 (×2): qty 1

## 2018-05-29 NOTE — Progress Notes (Signed)
ANTICOAGULATION CONSULT NOTE   Pharmacy Consult for heparin Indication: chest pain/ACS  Patient Measurements: Height: 6\' 1"  (185.4 cm) Weight: 189 lb 11.2 oz (86 kg) IBW/kg (Calculated) : 79.9 Heparin Dosing Weight: 83.9 kg  Vital Signs: Temp: 99.5 F (37.5 C) (12/13 1944) Temp Source: Oral (12/13 1944) BP: 117/74 (12/13 1944) Pulse Rate: 63 (12/13 1944)  Labs: Recent Labs    05/28/18 1445 05/28/18 2242 05/29/18 0321 05/29/18 0729 05/29/18 2135  HGB 12.1* 11.2* 11.5*  --   --   HCT 39.9 36.5* 36.9*  --   --   PLT 211 208 184  --   --   HEPARINUNFRC  --   --   --  0.10* 0.24*  CREATININE 1.56* 1.57*  --   --   --   TROPONINI  --  0.42* 0.39* 0.34*  --     Estimated Creatinine Clearance: 45.9 mL/min (A) (by C-G formula based on SCr of 1.57 mg/dL (H)).   Medical History: Past Medical History:  Diagnosis Date  . Arthritis   . Diabetic neuropathy ([place])   . High cholesterol   . Hypertension    denies  . Osteomyelitis (HCC)    Right great toe  . Peripheral vascular disease ([place])    diabetic  with osteomylitis  . Type II diabetes mellitus (HCC)     Assessment: Patient with syncope. Starting heparin for rule out NSTEMI. hgb 12.1, plts wnl, SCr 1.5. Initial heparin level 0.24 units/ml  Goal of Therapy:  Heparin level 0.3-0.7 units/ml Monitor platelets by anticoagulation protocol: Yes    Plan:  -Increase Heparin 1400 units/hr -Daily HL, CBC   Excell Seltzer PharmD Clinical Pharmacist 05/29/2018 11:13 PM

## 2018-05-29 NOTE — Progress Notes (Signed)
ANTICOAGULATION CONSULT NOTE   Pharmacy Consult for heparin Indication: chest pain/ACS  Patient Measurements: Height: 6\' 1"  (185.4 cm) Weight: 189 lb 11.2 oz (86 kg) IBW/kg (Calculated) : 79.9 Heparin Dosing Weight: 83.9 kg  Vital Signs: Temp: 97.8 F (36.6 C) (12/13 1119) Temp Source: Oral (12/13 1119) BP: 106/61 (12/13 1119) Pulse Rate: 59 (12/13 1119)  Labs: Recent Labs    05/28/18 1445 05/28/18 2242 05/29/18 0321 05/29/18 0729  HGB 12.1* 11.2* 11.5*  --   HCT 39.9 36.5* 36.9*  --   PLT 211 208 184  --   HEPARINUNFRC  --   --   --  0.10*  CREATININE 1.56* 1.57*  --   --   TROPONINI  --  0.42* 0.39* 0.34*    Estimated Creatinine Clearance: 45.9 mL/min (A) (by C-G formula based on SCr of 1.57 mg/dL (H)).   Medical History: Past Medical History:  Diagnosis Date  . Arthritis   . Diabetic neuropathy ([place])   . High cholesterol   . Hypertension    denies  . Osteomyelitis (HCC)    Right great toe  . Peripheral vascular disease ([place])    diabetic  with osteomylitis  . Type II diabetes mellitus (HCC)     Assessment: Patient with syncope. Starting heparin for rule out NSTEMI. hgb 12.1, plts wnl, SCr 1.5.  Goal of Therapy:  Heparin level 0.3-0.7 units/ml Monitor platelets by anticoagulation protocol: Yes    Plan:  -Rebolus Heparin 3000 units x 1 then 1300 units/hr -Daily HL, CBC   Erin Hearing PharmD., BCPS Clinical Pharmacist 05/29/2018 1:45 PM

## 2018-05-29 NOTE — Plan of Care (Signed)

## 2018-05-29 NOTE — Consult Note (Signed)
[facility] Nurse wound consult note Reason for Consult: Consult requested for bilat feet.  Pt states he is followed as an outpatient by Dr Sharol Given for serial debridements and has home health for dressing changes a few times a week.  He does not know what topical treatment is being used; it appears to be Iodosorb when site was assessed.  Pt does not have any toes on either side and stumps are well healed from previous surgeries. We do not havee this topical treatment in the [place] and I will plan to substitute Aquacel.  Pt can resume use of the previous plan of care after discharge. Wound type: Left plantar foot with chronic full thickness wound; .8X1X.3cm, dry brown wound bed, no odor, drainage, or fluctuance.  Dry callous slightly raised surrounding wound.  Right plantar foot with chronic full thickness wound; .3X.3X.5cm, narrow opening and unable to visualize wound bed when probed with a swab.  Mod amt red drainage, no odor or fluctuance. Dressing procedure/placement/frequency: Aquacel to absorb drainage and provide antimicrobial benefits, foam dressing to protect from further injury.  Discussed plan of care with patient and family member at the bedside. Please re-consult if further assistance is needed.  Thank-you,  Julien Girt MSN, [place], [place], [place], [place]

## 2018-05-29 NOTE — Progress Notes (Signed)
PROGRESS NOTE    [name]  WVP:[id number] DOB: 10-02-1940 DOA: 05/28/2018 PCP: [name], MD  Outpatient Specialists:     Brief Narrative: [name] is a 77 y.o. male with medical history significant of diabetes, hyperlipidemia, hypertension, osteoarthritis, tobacco abuse, peripheral vascular disease and previous osteomyelitis who came to the ER after he passed out at home.  Patient lives alone and was down and out for 1 to 2 minutes.  He was able to come around and called EMS.  On presentation to the hospital, patient was found to have minimally elevated troponin.  Patient has been on heparin drip.  Cardiology team was consulted.  Reviewed telemetry, no significant findings so far.  Echocardiogram is pending.  No further syncope reported.  No prior syncope reported.  Blood sugar has been low normal range (81 to 111).  HbA1c is 5.2%.  Prior episodes of low blood sugar documented.  Awaiting cardiology input.  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 Patient is on heparin.   Awaiting cardiology input.   Patient continues to deny chest pain or shortness of breath. Echocardiogram result is pending.  #2 syncopal episode:  Telemetry reviewed.  No significant findings. Awaiting cardiology input. Patient may need to be discharged on cardiac monitor for a month. Syncope could be secondary to low blood sugar, but reported symptoms were not classic.  #3 diabetes:  Patient is on metformin.   Blood sugar ranges from 81-111.   HbA1c is 5.2%.    #4 hypertension:  Continue to optimize.    #5 hyperlipidemia: Continue statin.  #6 tobacco abuse: Counseling provided.  Will initiate nicotine  patch   DVT prophylaxis: Heparin drip Code Status: Full code Family Communication: No family available.  Niece will be coming later Disposition Plan: Home  Consultants:   As per ER note, cardiology has been consulted  Procedures:   Echo report is pending  Antimicrobials:   None   Subjective: No chest pain. No shortness of breath. No fever or chills.  Objective: Vitals:   05/28/18 2335 05/29/18 0305 05/29/18 0824 05/29/18 1119  BP: (!) 90/54 103/61 112/70 106/61  Pulse: 60 66 62 (!) 59  Resp: 16 (!) 25 14 18   Temp: 98.2 F (36.8 C) 98 F (36.7 C) 98.9 F (37.2 C) 97.8 F (36.6 C)  TempSrc: Oral Oral Oral Oral  SpO2: 98% 100% 99% 99%  Weight:      Height:        Intake/Output Summary (Last 24 hours) at 05/29/2018 1408 Last data filed at 05/29/2018 1300 Gross per 24 hour  Intake 1411.6 ml  Output 650 ml  Net 761.6 ml   Filed Weights   05/28/18 1440 05/28/18 2142  Weight: 83.9 kg 86 kg    Examination:  General exam: Appears calm and comfortable  Respiratory system: Clear to auscultation. Respiratory effort normal. Cardiovascular system: S1 & S2 heard. No pedal edema. Gastrointestinal system: Abdomen is nondistended, soft and nontender. No organomegaly or masses felt. Normal bowel sounds heard. Central nervous system: Alert and oriented. No focal neurological deficits. Extremities: Symmetric 5 x 5 power.  Data Reviewed: I have personally reviewed following labs and imaging studies  CBC: Recent Labs  Lab 05/28/18 1445 05/28/18  2242 05/29/18 0321  WBC 12.5* 13.1* 11.8*  HGB 12.1* 11.2* 11.5*  HCT 39.9 36.5* 36.9*  MCV 90.3 89.0 88.7  PLT 211 208 347   Basic Metabolic Panel: Recent Labs  Lab 05/28/18 1445 05/28/18 1513 05/28/18 2242  NA 137  --   --   K 4.5  --   --   CL 105  --   --   CO2 21*  --   --   GLUCOSE 116*  --   --   BUN 14  --   --   CREATININE 1.56*  --  1.57*  CALCIUM 9.0  --   --   MG  --  1.8  --     GFR: Estimated Creatinine Clearance: 45.9 mL/min (A) (by C-G formula based on SCr of 1.57 mg/dL (H)). Liver Function Tests: Recent Labs  Lab 05/28/18 1513  AST 19  ALT 12  ALKPHOS 41  BILITOT 0.9  PROT 8.2*  ALBUMIN 3.5   No results for input(s): LIPASE, AMYLASE in the last 168 hours. No results for input(s): AMMONIA in the last 168 hours. Coagulation Profile: No results for input(s): INR, PROTIME in the last 168 hours. Cardiac Enzymes: Recent Labs  Lab 05/28/18 2242 05/29/18 0321 05/29/18 0729  TROPONINI 0.42* 0.39* 0.34*   BNP (last 3 results) No results for input(s): PROBNP in the last 8760 hours. HbA1C: Recent Labs    05/29/18 1036  HGBA1C 5.2   CBG: Recent Labs  Lab 05/28/18 1500 05/28/18 2155 05/29/18 0822 05/29/18 1210  GLUCAP 102* 105* 81 111*   Lipid Profile: No results for input(s): CHOL, HDL, LDLCALC, TRIG, CHOLHDL, LDLDIRECT in the last 72 hours. Thyroid Function Tests: No results for input(s): TSH, T4TOTAL, FREET4, T3FREE, THYROIDAB in the last 72 hours. Anemia Panel: No results for input(s): VITAMINB12, FOLATE, FERRITIN, TIBC, IRON, RETICCTPCT in the last 72 hours. Urine analysis:    Component Value Date/Time   COLORURINE YELLOW 05/28/2018 [place] 05/28/2018 1445   LABSPEC 1.016 05/28/2018 1445   PHURINE 5.0 05/28/2018 1445   GLUCOSEU NEGATIVE 05/28/2018 1445   HGBUR SMALL (A) 05/28/2018 1445   HGBUR trace-intact 09/16/2007 1115   BILIRUBINUR NEGATIVE 05/28/2018 1445   KETONESUR NEGATIVE 05/28/2018 1445   PROTEINUR 30 (A) 05/28/2018 1445   UROBILINOGEN 1.0 10/24/2013 1220   NITRITE NEGATIVE 05/28/2018 1445   LEUKOCYTESUR NEGATIVE 05/28/2018 1445   Sepsis Labs: @LABRCNTIP (procalcitonin:4,lacticidven:4)  )No results found for this or any previous visit (from the past 240 hour(s)).       Radiology Studies: Ct Head Wo Contrast  Result Date: 05/28/2018 CLINICAL DATA:  Weakness today with falling. EXAM: CT  HEAD WITHOUT CONTRAST TECHNIQUE: Contiguous axial images were obtained from the base of the skull through the vertex without intravenous contrast. COMPARISON:  MRI and CT 06/24/2017 FINDINGS: Brain: Age related atrophy. No evidence of old or acute focal infarction, mass lesion, hemorrhage, hydrocephalus or extra-axial collection. Vascular: There is atherosclerotic calcification of the major vessels at the base of the brain. Skull: No acute finding. Hyperostosis frontalis interna. Sinuses/Orbits: Clear/normal. Glaucoma surgery on the left. Other: None IMPRESSION: No acute or traumatic finding. Age related atrophy. Electronically Signed   By: Nelson Chimes M.D.   On: 05/28/2018 15:52   Dg Chest Portable 1 View  Result Date: 05/28/2018 CLINICAL DATA:  Chest pain and shortness breath. EXAM: PORTABLE CHEST 1 VIEW COMPARISON:  10/20/2013 FINDINGS: Low lung volumes are present, causing crowding of the pulmonary vasculature. Upper normal heart  size. Thoracic spondylosis. The lungs appear clear. No blunting of the costophrenic angle. Notable chronic inferior spurring from the right glenoid. IMPRESSION: 1. Low lung volumes are present, causing crowding of the pulmonary vasculature. 2. Thoracic spondylosis. 3. Lungs otherwise clear. Electronically Signed   By: Van Clines M.D.   On: 05/28/2018 19:37        Scheduled Meds: . aspirin EC  81 mg Oral Daily  . brimonidine  1 drop Both Eyes BID  . insulin aspart  0-5 Units Subcutaneous QHS  . insulin aspart  0-9 Units Subcutaneous TID WC  . latanoprost  1 drop Both Eyes QHS  . metoprolol tartrate  25 mg Oral BID  . nicotine  21 mg Transdermal Daily  . pyridOXINE  50 mg Oral BID  . thiamine  100 mg Oral Daily  . timolol  1 drop Both Eyes BID  . vitamin B-12  500 mcg Oral Daily   Continuous Infusions: . heparin 1,300 Units/hr (05/29/18 1354)     LOS: 1 day    Time spent: [place]    Dana Allan, MD  [facility] Pager #: [phone] 7PM-7AM contact night coverage as above

## 2018-05-29 NOTE — Progress Notes (Signed)
  Echocardiogram 2D Echocardiogram has been performed.  Jad Johansson G Da[place]ion Pape 05/29/2018, 11:23 AM

## 2018-05-29 NOTE — Progress Notes (Signed)
Carotid duplex completed - preliminary results can be found in chart review CV Proc. [place] Saed Hudlow,RVS 05/29/2018,3:27 PM

## 2018-05-30 DIAGNOSIS — E1169 Type 2 diabetes mellitus with other specified complication: Secondary | ICD-10-CM | POA: Diagnosis not present

## 2018-05-30 DIAGNOSIS — Z720 Tobacco use: Secondary | ICD-10-CM

## 2018-05-30 DIAGNOSIS — R55 Syncope and collapse: Principal | ICD-10-CM

## 2018-05-30 DIAGNOSIS — I248 Other forms of acute ischemic heart disease: Secondary | ICD-10-CM

## 2018-05-30 DIAGNOSIS — I214 Non-ST elevation (NSTEMI) myocardial infarction: Secondary | ICD-10-CM | POA: Diagnosis not present

## 2018-05-30 DIAGNOSIS — E785 Hyperlipidemia, unspecified: Secondary | ICD-10-CM

## 2018-05-30 LAB — CBC
HCT: 39.3 % (ref 39.0–52.0)
Hemoglobin: 12.2 g/dL — ABNORMAL LOW (ref 13.0–17.0)
MCH: 27.4 pg (ref 26.0–34.0)
MCHC: 31 g/dL (ref 30.0–36.0)
MCV: 88.1 fL (ref 80.0–100.0)
Platelets: 165 10*3/uL (ref 150–400)
RBC: 4.46 MIL/uL (ref 4.22–5.81)
RDW: 15.2 % (ref 11.5–15.5)
WBC: 7.1 10*3/uL (ref 4.0–10.5)
nRBC: 0 % (ref 0.0–0.2)

## 2018-05-30 LAB — GLUCOSE, CAPILLARY
Glucose-Capillary: 75 mg/dL (ref 70–99)
Glucose-Capillary: 92 mg/dL (ref 70–99)
Glucose-Capillary: 79 mg/dL (ref 70–99)

## 2018-05-30 LAB — HEPARIN LEVEL (UNFRACTIONATED): Heparin Unfractionated: 0.47 IU/mL (ref 0.30–0.70)

## 2018-05-30 MED ORDER — ATORVASTATIN CALCIUM 40 MG PO TABS: 40.0000 mg | ORAL_TABLET | Freq: Every day | ORAL | Status: DC

## 2018-05-30 MED ORDER — ASPIRIN 81 MG PO TBEC: 81.0000 mg | DELAYED_RELEASE_TABLET | Freq: Every day | ORAL | 0 refills | Status: DC

## 2018-05-30 MED ORDER — ATORVASTATIN CALCIUM 40 MG PO TABS: 40.0000 mg | ORAL_TABLET | Freq: Every day | ORAL | 0 refills | Status: DC

## 2018-05-30 NOTE — Consult Note (Signed)
Cardiology Consultation:   Patient ID: Khaleel Beckom MRN: [id number]; DOB: [birth year]  Admit date: 05/28/2018 Date of Consult: 05/30/2018  Primary Care Provider: [name], MD Primary Cardiologist: Candee Furbish, MD  Primary Electrophysiologist:  None    Patient Profile:   [name] is a 77 y.o. male with a hx of diabetes with hypertension tobacco use peripheral vascular disease who is being seen today for the evaluation of syncope, elevated troponin at the request of Dr. Jonelle Sidle.  History of Present Illness:   Mr. Odoherty is a 77 year old male with diabetes with peripheral vascular disease hypertension hyperlipidemia tobacco use, prior great toe amputation who was admitted on 05/28/2018 for loss of consciousness.  He lives alone and by history passed out after getting up from his kitchen and walking towards the living room, fell forward and was down for maybe 1 or 2 minutes.  When he was able, he called EMS and he was brought to the emergency room where he was fully conscious.  He felt weak, did not recall the experience completely.  Does not remember having any significant nausea or diaphoresis.  No chest pain, no shortness of breath, no history of thrombosis. His troponin was elevated.  0.3.  Hemoglobin was 12.1 and creatinine was 1.5.  His blood pressure on arrival was low at 101/53.  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    Past Medical History:  Diagnosis Date  . Arthritis   . Diabetic neuropathy ([place])   . High cholesterol   . Hypertension    denies  . Osteomyelitis (HCC)    Right great toe  . Peripheral vascular disease ([place])    diabetic  with osteomylitis  . Type II diabetes mellitus ([place])     Past Surgical History:  Procedure Laterality Date  . AMPUTATION  07/23/2011   Procedure: AMPUTATION DIGIT;  Surgeon: Newt Minion, MD;   Location: [facility];  Service: Orthopedics;  Laterality: Right;  Right Great Toe Amputation  . AMPUTATION Right 09/18/2012   Procedure: Right Foot 2nd Ray Amputation;  Surgeon: Newt Minion, MD;  Location: [place];  Service: Orthopedics;  Laterality: Right;  Right Foot 2nd Ray Amputation  . AMPUTATION Right 10/14/2012   Procedure: AMPUTATION FOOT;  Surgeon: Newt Minion, MD;  Location: [place];  Service: Orthopedics;  Laterality: Right;  Right Foot Transmetatarsal Amputation  . AMPUTATION Left 09/23/2014   Procedure: Left Foot Transmetatarsal Amputation;  Surgeon: Newt Minion, MD;  Location: [place];  Service: Orthopedics;  Laterality: Left;  . BACK SURGERY     lower  . CARPAL TUNNEL RELEASE Right 01/30/2018   Procedure: RIGHT CARPAL TUNNEL RELEASE;  Surgeon: Newt Minion, MD;  Location: [place] [facility];  Service: Orthopedics;  Laterality: Right;  . CIRCUMCISION  2010  . COLONOSCOPY    . FOOT FRACTURE SURGERY Left 1970's   "broke it playing football" (10/12/2012)  . HUMERUS FRACTURE SURGERY W/ IMPLANT Right 1960's   "put a plate in it" (07/05/4172)  . INCISION AND DRAINAGE OF WOUND Left 2006   "foot" (10/12/2012)  . [place] SURGERY  2008     Home Medications:  Prior to Admission medications   Medication Sig Start Date End Date Taking? Authorizing Provider  acetaminophen (TYLENOL) 500 MG tablet Take 1,000 mg by mouth every 6 (six) hours as needed for mild pain.   Yes [provider]  ALPHAGAN P 0.1 % SOLN Place 1 drop into both eyes 2 (two) times daily.  12/31/16  Yes [provider]  latanoprost (XALATAN) 0.005 % ophthalmic solution Place 1 drop into both eyes at bedtime.  09/12/14  Yes [provider]  metFORMIN (GLUCOPHAGE) 1000 MG tablet Take 1,000 mg by mouth 2 (two) times daily.    Yes [provider]  pyridOXINE (VITAMIN B-6) 50 MG tablet Take 50 mg by mouth 2 (two) times daily.    Yes [provider]  thiamine (VITAMIN B-1) 100 MG tablet Take 100 mg by  mouth daily.   Yes [provider]  timolol (TIMOPTIC) 0.5 % ophthalmic solution Place 1 drop into both eyes 2 (two) times daily. 05/19/17  Yes [provider]  vitamin B-12 (CYANOCOBALAMIN) 500 MCG tablet Take 500 mcg by mouth daily.   Yes [provider]  oxyCODONE-acetaminophen (PERCOCET/ROXICET) 5-325 MG tablet Take 1 tablet by mouth every 4 (four) hours as needed for severe pain. Patient not taking: Reported on 05/28/2018 01/30/18   Newt Minion, MD    Inpatient Medications: Scheduled Meds: . aspirin EC  81 mg Oral Daily  . brimonidine  1 drop Both Eyes BID  . insulin aspart  0-5 Units Subcutaneous QHS  . insulin aspart  0-9 Units Subcutaneous TID WC  . latanoprost  1 drop Both Eyes QHS  . metoprolol tartrate  25 mg Oral BID  . nicotine  21 mg Transdermal Daily  . pyridOXINE  50 mg Oral BID  . thiamine  100 mg Oral Daily  . timolol  1 drop Both Eyes BID  . vitamin B-12  500 mcg Oral Daily   Continuous Infusions: . heparin 1,400 Units/hr (05/30/18 1100)   PRN Meds: acetaminophen, nitroGLYCERIN, ondansetron (ZOFRAN) IV  Allergies:   No Known Allergies  Social History:   Social History   Socioeconomic History  . Marital status: Single    Spouse name: Not on file  . Number of children: 1  . Years of education: 31  . Highest education level: Not on file  Occupational History  . Not on file  Social Needs  . Financial resource strain: Not on file  . Food insecurity:    Worry: Not on file    Inability: Not on file  . Transportation needs:    Medical: Not on file    Non-medical: Not on file  Tobacco Use  . Smoking status: Current Every Day Smoker    Years: 12.00    Types: Pipe  . Smokeless tobacco: Never Used  Substance and Sexual Activity  . Alcohol use: No    Alcohol/week: 0.0 standard drinks  . Drug use: No  . Sexual activity: Not Currently  Lifestyle  . Physical activity:    Days per week: Not on file    Minutes per session: Not  on file  . Stress: Not on file  Relationships  . Social connections:    Talks on phone: Not on file    Gets together: Not on file    Attends religious service: Not on file    Active member of club or organization: Not on file    Attends meetings of clubs or organizations: Not on file    Relationship status: Not on file  . Intimate partner violence:    Fear of current or ex partner: Not on file    Emotionally abused: Not on file    Physically abused: Not on file    Forced sexual  activity: Not on file  Other Topics Concern  . Not on file  Social History Narrative   Lives alone in an apartment on the first floor.  Has one daughter.  Retired Games developer.  Education: 12th grade.     Family History:    Family History  Problem Relation Age of Onset  . Diabetes Mellitus II Other   . Anesthesia problems Neg Hx      ROS:  Please see the history of present illness.  No fevers chills chest pain shortness of breath bleeding All other ROS reviewed and negative.     Physical Exam/Data:   Vitals:   05/30/18 0334 05/30/18 0823 05/30/18 0926 05/30/18 1121  BP: 126/73 124/75  106/61  Pulse: 70 (!) 56 64 (!) 50  Resp: (!) 23 15  17   Temp: 99 F (37.2 C) 98 F (36.7 C)  98.6 F (37 C)  TempSrc: Oral Oral  Oral  SpO2: 100% 100%  96%  Weight:      Height:        Intake/Output Summary (Last 24 hours) at 05/30/2018 1142 Last data filed at 05/30/2018 1100 Gross per 24 hour  Intake 630.5 ml  Output 1450 ml  Net -819.5 ml   Filed Weights   05/28/18 1440 05/28/18 2142  Weight: 83.9 kg 86 kg   Body mass index is 25.03 kg/m.  General:  Well nourished, well developed, in no acute distress  HEENT: normal Lymph: no adenopathy Neck: no JVD Endocrine:  No thryomegaly Vascular: No carotid bruits Cardiac:  normal S1, S2; RRR; no murmur  Lungs:  clear to auscultation bilaterally, no wheezing, rhonchi or rales  Abd: soft, nontender, no hepatomegaly  Ext: no edema Musculoskeletal:   No deformities, BUE and BLE strength normal and equal Skin: warm and dry  Neuro:  CNs 2-12 intact, no focal abnormalities noted Psych:  Normal affect   EKG:  The EKG was personally reviewed and demonstrates: Sinus rhythm with right bundle branch block left anterior fascicular block, bifascicular block  Telemetry:  Telemetry was personally reviewed and demonstrates: Sinus rhythm no other abnormalities, no pauses  Relevant CV Studies:  ECHO 05/29/18:  - Left ventricle: The cavity size was normal. Wall thickness was   increased in a pattern of mild LVH. There was moderate focal   basal hypertrophy of the septum. Systolic function was normal.   The estimated ejection fraction was in the range of 50% to 55%.   Mild hypokinesis of the anterior and anterolateral myocardium.   Doppler parameters are consistent with a reversible restrictive   pattern, indicative of decreased left ventricular diastolic   compliance and/or increased left atrial pressure (grade 3   diastolic dysfunction).   Laboratory Data:  Chemistry Recent Labs  Lab 05/28/18 1445 05/28/18 2242  NA 137  --   K 4.5  --   CL 105  --   CO2 21*  --   GLUCOSE 116*  --   BUN 14  --   CREATININE 1.56* 1.57*  CALCIUM 9.0  --   GFRNONAA 43* 42*  GFRAA 50* 49*  ANIONGAP 11  --     Recent Labs  Lab 05/28/18 1513  PROT 8.2*  ALBUMIN 3.5  AST 19  ALT 12  ALKPHOS 41  BILITOT 0.9   Hematology Recent Labs  Lab 05/28/18 2242 05/29/18 0321 05/30/18 0753  WBC 13.1* 11.8* 7.1  RBC 4.10* 4.16* 4.46  HGB 11.2* 11.5* 12.2*  HCT 36.5* 36.9* 39.3  MCV  89.0 88.7 88.1  MCH 27.3 27.6 27.4  MCHC 30.7 31.2 31.0  RDW 15.3 15.4 15.2  PLT 208 184 165   Cardiac Enzymes Recent Labs  Lab 05/28/18 2242 05/29/18 0321 05/29/18 0729  TROPONINI 0.42* 0.39* 0.34*    Recent Labs  Lab 05/28/18 1659  TROPIPOC 0.30*    BNPNo results for input(s): BNP, PROBNP in the last 168 hours.  DDimer No results for input(s): DDIMER  in the last 168 hours.  Radiology/Studies:  Ct Head Wo Contrast  Result Date: 05/28/2018 CLINICAL DATA:  Weakness today with falling. EXAM: CT HEAD WITHOUT CONTRAST TECHNIQUE: Contiguous axial images were obtained from the base of the skull through the vertex without intravenous contrast. COMPARISON:  MRI and CT 06/24/2017 FINDINGS: Brain: Age related atrophy. No evidence of old or acute focal infarction, mass lesion, hemorrhage, hydrocephalus or extra-axial collection. Vascular: There is atherosclerotic calcification of the major vessels at the base of the brain. Skull: No acute finding. Hyperostosis frontalis interna. Sinuses/Orbits: Clear/normal. Glaucoma surgery on the left. Other: None IMPRESSION: No acute or traumatic finding. Age related atrophy. Electronically Signed   By: Nelson Chimes M.D.   On: 05/28/2018 15:52   Dg Chest Portable 1 View  Result Date: 05/28/2018 CLINICAL DATA:  Chest pain and shortness breath. EXAM: PORTABLE CHEST 1 VIEW COMPARISON:  10/20/2013 FINDINGS: Low lung volumes are present, causing crowding of the pulmonary vasculature. Upper normal heart size. Thoracic spondylosis. The lungs appear clear. No blunting of the costophrenic angle. Notable chronic inferior spurring from the right glenoid. IMPRESSION: 1. Low lung volumes are present, causing crowding of the pulmonary vasculature. 2. Thoracic spondylosis. 3. Lungs otherwise clear. Electronically Signed   By: Van Clines M.D.   On: 05/28/2018 19:37   Vas US Carotid  Result Date: 05/29/2018 Carotid Arterial Duplex Study Indications: Syncope. Performing Technologist: Toma Copier RVS  Examination Guidelines: A complete evaluation includes B-mode imaging, spectral Doppler, color Doppler, and power Doppler as needed of all accessible portions of each vessel. Bilateral testing is considered an integral part of a complete examination. Limited examinations for reoccurring indications may be performed as noted.   Right Carotid Findings: +----------+--------+--------+--------+------------+---------------------------+           PSV cm/sEDV cm/sStenosisDescribe    Comments                    +----------+--------+--------+--------+------------+---------------------------+ CCA Prox  106     7                           Mild intimal changes,                                                     tortuous                    +----------+--------+--------+--------+------------+---------------------------+ CCA Distal90      9                           mild intimal changes        +----------+--------+--------+--------+------------+---------------------------+ ICA Prox  37      7               heterogenousmild plaque                 +----------+--------+--------+--------+------------+---------------------------+  ICA Mid   65      14                                                      +----------+--------+--------+--------+------------+---------------------------+ ICA Distal66      15                          tortuous                    +----------+--------+--------+--------+------------+---------------------------+ ECA       71      1                           technically difficult to                                                  image due to high                                                         bifurcation                 +----------+--------+--------+--------+------------+---------------------------+ +----------+--------+-------+--------+-------------------+           PSV cm/sEDV cmsDescribeArm Pressure (mmHG) +----------+--------+-------+--------+-------------------+ [id number]                                         +----------+--------+-------+--------+-------------------+ +---------+--------+--+--------+-+ VertebralPSV cm/s31EDV cm/s9 +---------+--------+--+--------+-+ Technically difficult due to bifurcation at the jawline.  Left  Carotid Findings: +----------+--------+--------+--------+--------+-------------------------------+           PSV cm/sEDV cm/sStenosisDescribeComments                        +----------+--------+--------+--------+--------+-------------------------------+ CCA Prox  135     17                      tortuous                        +----------+--------+--------+--------+--------+-------------------------------+ CCA Distal72      10                      mild intimal changes            +----------+--------+--------+--------+--------+-------------------------------+ ICA Prox  42      5                       tortuous with mild palque       +----------+--------+--------+--------+--------+-------------------------------+ ICA Mid   78      18                      tortuous                        +----------+--------+--------+--------+--------+-------------------------------+  ICA Distal68      17                      tortuous                        +----------+--------+--------+--------+--------+-------------------------------+ ECA       82      12                      technically difficult due to                                              high bifurcation.               +----------+--------+--------+--------+--------+-------------------------------+ +----------+--------+--------+--------+-------------------+ SubclavianPSV cm/sEDV cm/sDescribeArm Pressure (mmHG) +----------+--------+--------+--------+-------------------+           146     19                                  +----------+--------+--------+--------+-------------------+ +---------+--------+--+--------+--+ VertebralPSV cm/s44EDV cm/s12 +---------+--------+--+--------+--+ Technically difficult due to bifurcation at the jawline  Summary: Right Carotid: Velocities in the right ICA are consistent with a 1-39% stenosis. Left Carotid: Velocities in the left ICA are consistent with a 1-39% stenosis.  Vertebrals:  Bilateral vertebral arteries demonstrate antegrade flow. Subclavians: Normal flow hemodynamics were seen in bilateral subclavian              arteries. *See table(s) above for measurements and observations.  Electronically signed by Antony Contras MD on 05/29/2018 at 6:10:56 PM.    Final     MRI of brain on 06/24/2017 performed because of ataxia was normal.  Troponin 0.39-0.34 flat, minimally elevated  Assessment and Plan:   Syncope -He does have underlying right bundle branch block and left anterior fascicular block which could point towards an electrical etiology for his syncope.  However, his blood pressure was fairly low on arrival and continues to be fairly low and I wonder if this could have been an orthostatic episode or perhaps neurocardiogenic syncope or vasovagal syncope.  Carotid Dopplers reassuring.  Recent MRI reassuring.  Echocardiogram with normal overall ejection fraction.  Syncope usually is not an anginal equivalent unless someone is having a right coronary artery acute MI disrupting the electrical system of the heart. - Would encourage salt liberalization and adequate hydration, perhaps compression stockings.   Elevated troponin - This is flat, 0.39, 0.34.  Likely demand ischemia potentially from transient hypotension.  He never did have any chest discomfort.  No reports of any anginal symptoms with ambulation preceding this. -I will see him as an outpatient and decide on further noninvasive evaluation such as nuclear stress test. -For now, I will discontinue his heparin.  I do not think that he requires cardiac catheterization.  I do not think this is acute coronary syndrome or a non-ST elevation myocardial infarction.  I will also discontinue his low dose metoprolol as well.  I do not want to exacerbate any lower blood pressures.  Diabetes with peripheral vascular disease - I will place him on atorvastatin 40 mg once a day.  High intensity statin therapy to help  overall risk reduction.  I am comfortable with his discharge.  We will set up follow-up.   [facility]  will sign off.   Medication Recommendations: Atorvastatin, salt liberalization, fluid liberalization Other recommendations (labs, testing, etc): We will contemplate noninvasive stress testing as outpatient Follow up as an outpatient: We will set up in 2 to 4 weeks  For questions or updates, please contact [place] Please consult [email or web address] for contact info under     Signed, Candee Furbish, MD  05/30/2018 11:42 AM

## 2018-05-30 NOTE — Progress Notes (Signed)
All set for discharge home , discharge instructions given to niece. Pt wants to leave after dinner.

## 2018-05-30 NOTE — Progress Notes (Signed)
Pt. Was discharged home with all belongings. VS stable at time of discharge. Tech transported pt. Via wheelchair to main entrance of hospital where pts. Niece was waiting to transfer pt. Home.

## 2018-05-30 NOTE — Discharge Summary (Signed)
Physician Discharge Summary  Patient ID: [name] MRN: [id number] DOB/AGE: 1941/01/02 77 y.o.  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  Patient was admitted with syncopal episode.  On presentation, the patient was noted to have minimally elevated troponin.  As per previous documentation, the ER physician discussed with the cardiologist on-call and was advised that patient should be started on heparin drip.  Patient was admitted for further assessment and management.  Troponin was cycled, and ranged from 0.34 to 0.42.  Patient has remained chest pain-free.  No further syncopal episodes noted.  Her sugar was low normal, with HbA1c of 5.2%.  Blood pressure was also noted to be low normal.  CT head was non-revealing.  Chest x-ray did not show any acute abnormalities.  Patient has remained symptoms free.  Patient was seen by cardiology prior to discharge.  Cardiology team after patient for discharge.  Patient will follow with her primary care provider and cardiology on discharge.  Patient will benefit from 30-day event monitor.  Consults: cardiology  Significant Diagnostic Studies:  Troponin ranged from 0.34 to 0.42.  Echocardiogram revealed "Overall preserved LV EF, but mild hypokinesis of the anterior and   anterolateral wall concerning for ischemia".  Discharge Exam: Blood pressure 133/73, pulse (!) 57,  temperature 98.3 F (36.8 C), temperature source Oral, resp. rate 10, height 6\' 1"  (1.854 m), weight 86 kg, SpO2 100 %.   Disposition: Discharge disposition: 01-Home or Self Care   Discharge Instructions    Diet - low sodium heart healthy   Complete by:  As directed    Increase activity slowly   Complete by:  As directed      Allergies as of 05/30/2018   No Known Allergies     Medication List    STOP taking these medications   oxyCODONE-acetaminophen 5-325 MG tablet Commonly known as:  PERCOCET/ROXICET     TAKE these medications   acetaminophen 500 MG tablet Commonly known as:  TYLENOL Take 1,000 mg by mouth every 6 (six) hours as needed for mild pain.   ALPHAGAN P 0.1 % Soln Generic drug:  brimonidine Place 1 drop into both eyes 2 (two) times daily.   aspirin 81 MG EC tablet Take 1 tablet (81 mg total) by mouth daily. Start taking on:  May 31, 2018   atorvastatin 40 MG tablet Commonly known as:  LIPITOR Take 1 tablet (40 mg total) by mouth daily at 6 PM.   latanoprost 0.005 % ophthalmic solution Commonly known as:  XALATAN Place 1 drop into both eyes at bedtime.   metFORMIN 1000 MG tablet Commonly known as:  GLUCOPHAGE Take 1,000 mg by mouth 2 (two) times daily.   pyridOXINE 50 MG tablet Commonly known as:  VITAMIN B-6 Take 50 mg by mouth 2 (two) times daily.   thiamine 100 MG tablet Commonly known as:  VITAMIN B-1 Take 100 mg by mouth daily.   timolol 0.5 % ophthalmic solution Commonly known as:  TIMOPTIC Place 1 drop into both eyes 2 (  two) times daily.   vitamin B-12 500 MCG tablet Commonly known as:  CYANOCOBALAMIN Take 500 mcg by mouth daily.        SignedBonnell Public 05/30/2018, 3:06 PM

## 2018-06-08 ENCOUNTER — Telehealth (INDEPENDENT_AMBULATORY_CARE_PROVIDER_SITE_OTHER): Payer: Self-pay | Admitting: Orthopedic Surgery

## 2018-06-08 NOTE — Telephone Encounter (Signed)
Called to advise verbal ok for orders below.

## 2018-06-08 NOTE — Telephone Encounter (Signed)
[facility]  [place]  [phone]    Verbal orders  Twice a week for nine weeks

## 2018-06-11 ENCOUNTER — Encounter: Payer: Self-pay | Admitting: Cardiology

## 2018-06-12 ENCOUNTER — Ambulatory Visit: Payer: Medicare Other | Admitting: Cardiology

## 2018-06-12 NOTE — Progress Notes (Deleted)
Cardiology Office Note   Date:  06/12/2018   ID:  [name], DOB 13-Oct-1940, MRN [id number]  PCP:  [name], MD  Cardiologist:  Dr. Marlou Porch     No chief complaint on file.     History of Present Illness: [name] is a 77 y.o. male who presents for ***  diabetes with peripheral vascular disease hypertension hyperlipidemia tobacco use, prior great toe amputation who was admitted on 05/28/2018 for loss of consciousness  passed out after getting up from his kitchen and walking towards the living room, fell forward and was down for maybe 1 or 2 minutes.  When he was able, he called EMS and he was brought to the emergency room where he was fully conscious.  He felt weak, did not recall the experience completely.  Does not remember having any significant nausea or diaphoresis.  No chest pain, no shortness of breath, no history of thrombosis. His troponin was elevated.  0.3.  Hemoglobin was 12.1 and creatinine was 1.5.  His blood pressure on arrival was low at 101/53.  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 Marlou Porch and thoughts were orthostatic hypotension or neurocardiogenic syncope.  Salt liberalization and support stockings recommended. troponins were flat and no angina of any kind - BB stopped.  To decide if nuc study is needed.  Statin was placed.    Today     Past Medical History:  Diagnosis Date  . Arthritis   . Diabetic neuropathy ([place])   . High cholesterol   . Hypertension    denies  . Osteomyelitis (HCC)    Right great toe  . Peripheral vascular disease ([place])    diabetic  with osteomylitis  . Type II diabetes mellitus ([place])     Past Surgical History:  Procedure Laterality Date  . AMPUTATION  07/23/2011   Procedure: AMPUTATION DIGIT;  Surgeon: Newt Minion, MD;  Location: [facility];  Service: Orthopedics;  Laterality: Right;  Right Great Toe Amputation  . AMPUTATION Right  09/18/2012   Procedure: Right Foot 2nd Ray Amputation;  Surgeon: Newt Minion, MD;  Location: [facility];  Service: Orthopedics;  Laterality: Right;  Right Foot 2nd Ray Amputation  . AMPUTATION Right 10/14/2012   Procedure: AMPUTATION FOOT;  Surgeon: Newt Minion, MD;  Location: [facility];  Service: Orthopedics;  Laterality: Right;  Right Foot Transmetatarsal Amputation  . AMPUTATION Left 09/23/2014   Procedure: Left Foot Transmetatarsal Amputation;  Surgeon: Newt Minion, MD;  Location: [facility];  Service: Orthopedics;  Laterality: Left;  . BACK SURGERY     lower  . CARPAL TUNNEL RELEASE Right 01/30/2018   Procedure: RIGHT CARPAL TUNNEL RELEASE;  Surgeon: Newt Minion, MD;  Location: [facility];  Service: Orthopedics;  Laterality: Right;  . CIRCUMCISION  2010  . COLONOSCOPY    . FOOT FRACTURE SURGERY Left 1970's   "broke it playing football" (10/12/2012)  . HUMERUS FRACTURE SURGERY W/ IMPLANT Right 1960's   "put a plate in it" (9/92/4268)  . INCISION AND DRAINAGE OF WOUND Left 2006   "foot" (10/12/2012)  . LUMBAR [place] SURGERY  2008     Current Outpatient Medications  Medication Sig Dispense Refill  . acetaminophen (TYLENOL) 500 MG tablet Take 1,000 mg by mouth every 6 (six) hours as needed for mild pain.    . ALPHAGAN P 0.1 % SOLN Place 1 drop into both eyes 2 (two)  times daily.     Marland Kitchen aspirin EC 81 MG EC tablet Take 1 tablet (81 mg total) by mouth daily. 30 tablet 0  . atorvastatin (LIPITOR) 40 MG tablet Take 1 tablet (40 mg total) by mouth daily at 6 PM. 30 tablet 0  . latanoprost (XALATAN) 0.005 % ophthalmic solution Place 1 drop into both eyes at bedtime.   6  . metFORMIN (GLUCOPHAGE) 1000 MG tablet Take 1,000 mg by mouth 2 (two) times daily.     Marland Kitchen pyridOXINE (VITAMIN B-6) 50 MG tablet Take 50 mg by mouth 2 (two) times daily.     Marland Kitchen thiamine (VITAMIN B-1) 100 MG tablet Take 100 mg by mouth daily.    . timolol (TIMOPTIC) 0.5 % ophthalmic solution Place 1 drop into both eyes 2 (two) times daily.  11  .  vitamin B-12 (CYANOCOBALAMIN) 500 MCG tablet Take 500 mcg by mouth daily.     No current facility-administered medications for this visit.     Allergies:   Patient has no known allergies.    Social History:  The patient  reports that he has been smoking pipe. He has smoked for the past 12.00 years. He has never used smokeless tobacco. He reports that he does not drink alcohol or use drugs.   Family History:  The patient's ***family history includes Diabetes Mellitus II in an other family member.    ROS:  General:no colds or fevers, no weight changes Skin:no rashes or ulcers HEENT:no blurred vision, no congestion CV:see HPI PUL:see HPI GI:no diarrhea constipation or melena, no indigestion GU:no hematuria, no dysuria MS:no joint pain, no claudication Neuro:no syncope, no lightheadedness Endo:no diabetes, no thyroid disease Wt Readings from Last 3 Encounters:  05/28/18 189 lb 11.2 oz (86 kg)  02/17/18 180 lb (81.6 kg)  01/30/18 180 lb (81.6 kg)     PHYSICAL EXAM: VS:  There were no vitals taken for this visit. , BMI There is no height or weight on file to calculate BMI. General:Pleasant affect, NAD Skin:Warm and dry, brisk capillary refill HEENT:normocephalic, sclera clear, mucus membranes moist Neck:supple, no JVD, no bruits  Heart:S1S2 RRR without murmur, gallup, rub or click Lungs:clear without rales, rhonchi, or wheezes JHE:RDEY, non tender, + BS, do not palpate liver spleen or masses Ext:no lower ext edema, 2+ pedal pulses, 2+ radial pulses Neuro:alert and oriented, MAE, follows commands, + facial symmetry    EKG:  EKG is ordered today. The ekg ordered today demonstrates ***   Recent Labs: 05/28/2018: ALT 12; BUN 14; Creatinine, Ser 1.57; Magnesium 1.8; Potassium 4.5; Sodium 137 05/30/2018: Hemoglobin 12.2; Platelets 165    Lipid Panel    Component Value Date/Time   CHOL 163 10/13/2012 0645   TRIG 124 10/13/2012 0645   HDL 20 (L) 10/13/2012 0645   CHOLHDL  8.2 10/13/2012 0645   VLDL 25 10/13/2012 0645   LDLCALC 118 (H) 10/13/2012 0645       Other studies Reviewed: Additional studies/ records that were reviewed today include: ***.  ECHO 05/29/18:  - Left ventricle: The cavity size was normal. Wall thickness was increased in a pattern of mild LVH. There was moderate focal basal hypertrophy of the septum. Systolic function was normal. The estimated ejection fraction was in the range of 50% to 55%. Mild hypokinesis of the anterior and anterolateral myocardium. Doppler parameters are consistent with a reversible restrictive pattern, indicative of decreased left ventricular diastolic compliance and/or increased left atrial pressure (grade 3 diastolic dysfunction).  ASSESSMENT AND PLAN:  1.  ***   Current medicines are reviewed with the patient today.  The patient Has no concerns regarding medicines.  The following changes have been made:  See above Labs/ tests ordered today include:see above  Disposition:   FU:  see above  Signed, Cecilie Kicks, NP  06/12/2018 1:08 PM    [facility] [place], [place], [place], [place] Phone: [phone]; Fax: [phone]

## 2018-06-15 ENCOUNTER — Encounter: Payer: Self-pay | Admitting: Cardiology

## 2018-06-22 ENCOUNTER — Encounter (INDEPENDENT_AMBULATORY_CARE_PROVIDER_SITE_OTHER): Payer: Self-pay | Admitting: Orthopedic Surgery

## 2018-06-22 ENCOUNTER — Ambulatory Visit (INDEPENDENT_AMBULATORY_CARE_PROVIDER_SITE_OTHER): Payer: Medicare Other | Admitting: Orthopedic Surgery

## 2018-06-22 VITALS — Ht 73.0 in | Wt 189.7 lb

## 2018-06-22 DIAGNOSIS — L97411 Non-pressure chronic ulcer of right heel and midfoot limited to breakdown of skin: Secondary | ICD-10-CM | POA: Diagnosis not present

## 2018-06-22 DIAGNOSIS — L97421 Non-pressure chronic ulcer of left heel and midfoot limited to breakdown of skin: Secondary | ICD-10-CM

## 2018-06-22 NOTE — Progress Notes (Signed)
Office Visit Note   Patient: [name]           Date of Birth: 08/09/1940           MRN: [id number] Visit Date: 06/22/2018              Requested by: [name], MD [address] [place], [place] [address] PCP: [name], MD  Chief Complaint  Patient presents with  . Left Foot - Follow-up  . Right Wrist - Follow-up      HPI: Patient is a 78 year old gentleman who presents with worsening ulcers on both feet status post bilateral transmetatarsal amputations.  Patient complains of pain burning and odor.  Assessment & Plan: Visit Diagnoses:  1. Midfoot ulceration, left, limited to breakdown of skin ([place])   2. Midfoot skin ulcer, right, limited to breakdown of skin ([place])     Plan: Ulcers were debrided of skin and soft tissue without complications recommended soap and water cleansing daily dressing changes reevaluate in 4 weeks continue with protective shoe wear.  Follow-Up Instructions: Return in about 4 weeks (around 07/20/2018).   Ortho Exam  Patient is alert, oriented, no adenopathy, well-dressed, normal affect, normal respiratory effort. Examination patient has no redness no cellulitis no purulence no signs of infection in either foot.  There is no tenderness to palpation.  Patient has a massive plantar ulcer on the left foot and a small plantar ulcer on the right foot.  After informed consent a 10 blade knife was used to debride the skin and soft tissue on both feet back to healthy viable granulation tissue silver nitrate was used for hemostasis there is no exposed bone or tendon no tunneling.  The right foot ulcer measures 10 mm in diameter 1 mm deep the left foot ulcer was 4 cm in diameter and 5 mm deep.  Dry dressings were applied bilaterally.  Imaging: No results found. No images are attached to the encounter.  Labs: Lab Results  Component Value Date   HGBA1C 5.2 05/29/2018   HGBA1C 5.4 01/30/2018   HGBA1C 8.1 (H) 08/23/2014   ESRSEDRATE 34 (H) 12/17/2017   ESRSEDRATE 45 (H) 09/27/2017   ESRSEDRATE 75 (H) 07/28/2017   CRP 17.1 (H) 12/17/2017   CRP 1.8 07/28/2017   CRP 15.8 (H) 09/06/2014   REPTSTATUS 10/25/2013 FINAL 10/24/2013   CULT NO GROWTH Performed at [facility] 10/24/2013     Lab Results  Component Value Date   ALBUMIN 3.5 05/28/2018   ALBUMIN 3.9 09/27/2017   ALBUMIN 3.7 06/24/2017    Body mass index is 25.03 kg/m.  Orders:  No orders of the defined types were placed in this encounter.  No orders of the defined types were placed in this encounter.    Procedures: No procedures performed  Clinical Data: No additional findings.  ROS:  All other systems negative, except as noted in the HPI. Review of Systems  Objective: Vital Signs: Ht 6\' 1"  (1.854 m)   Wt 189 lb 11.2 oz (86 kg)   BMI 25.03 kg/m   Specialty Comments:  No specialty comments available.  PMFS History: Patient Active Problem List   Diagnosis Date Noted  . NSTEMI (non-ST elevated myocardial infarction) ([place]) 05/28/2018  . Syncope 05/28/2018  . HTN (hypertension) 05/28/2018  . Hyperlipemia, mixed 05/28/2018  . Rash and nonspecific skin eruption 12/16/2017  . Pain in right hand 09/11/2017  . Carpal tunnel syndrome, right upper limb 07/01/2017  . Arthritis of carpometacarpal (CMC) joint  of left thumb 12/30/2016  . Midfoot skin ulcer, right, limited to breakdown of skin ([place]) 07/22/2016  . 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 Osteomyelitis of ankle or foot, left, acute ([place]) 08/23/2014  . Cellulitis 10/12/2012  . Chest pain 10/12/2012  . Tobacco abuse 10/12/2012  . ABSCESS, AXILLA, LEFT 02/23/2008  . 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 CHEST WALL PAIN, ACUTE 12/03/2007  . CANDIDIASIS, GLANS PENIS 09/16/2007  . HEADACHE 08/04/2007  . 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 Other and unspecified  hyperlipidemia 03/05/2007  . GOUT 03/05/2007  . ERECTILE DYSFUNCTION 03/05/2007  . EXTERNAL HEMORRHOIDS 03/05/2007  . VENTRAL HERNIA 03/05/2007  . FATTY LIVER DISEASE 03/05/2007  . Osteoarthritis 03/05/2007  . HEMORRHOIDS, INTERNAL 03/17/2005  . COLONIC POLYPS, HX OF 03/17/2005   Past Medical History:  Diagnosis Date  . Arthritis   . Diabetic neuropathy ([place])   . High cholesterol   . Hypertension    denies  . Osteomyelitis (HCC)    Right great toe  . Peripheral vascular disease ([place])    diabetic  with osteomylitis  . Type II diabetes mellitus (HCC)     Family History  Problem Relation Age of Onset  . Diabetes Mellitus II Other   . Anesthesia problems Neg Hx     Past Surgical History:  Procedure Laterality Date  . AMPUTATION  07/23/2011   Procedure: AMPUTATION DIGIT;  Surgeon: Newt Minion, MD;  Location: [facility];  Service: Orthopedics;  Laterality: Right;  Right Great Toe Amputation  . AMPUTATION Right 09/18/2012   Procedure: Right Foot 2nd Ray Amputation;  Surgeon: Newt Minion, MD;  Location: [facility];  Service: Orthopedics;  Laterality: Right;  Right Foot 2nd Ray Amputation  . AMPUTATION Right 10/14/2012   Procedure: AMPUTATION FOOT;  Surgeon: Newt Minion, MD;  Location: [facility];  Service: Orthopedics;  Laterality: Right;  Right Foot Transmetatarsal Amputation  . AMPUTATION Left 09/23/2014   Procedure: Left Foot Transmetatarsal Amputation;  Surgeon: Newt Minion, MD;  Location: [facility];  Service: Orthopedics;  Laterality: Left;  . BACK SURGERY     lower  . CARPAL TUNNEL RELEASE Right 01/30/2018   Procedure: RIGHT CARPAL TUNNEL RELEASE;  Surgeon: Newt Minion, MD;  Location: [facility];  Service: Orthopedics;  Laterality: Right;  . CIRCUMCISION  2010  . COLONOSCOPY    . FOOT FRACTURE SURGERY Left 1970's   "broke it playing football" (10/12/2012)  . HUMERUS FRACTURE SURGERY W/ IMPLANT Right 1960's   "put a plate in it" (0/78/6754)  . INCISION AND DRAINAGE OF WOUND Left 2006   "foot"  (10/12/2012)  . LUMBAR [place] SURGERY  2008   Social History   Occupational History  . Not on file  Tobacco Use  . Smoking status: Current Every Day Smoker    Years: 12.00    Types: Pipe  . Smokeless tobacco: Never Used  Substance and Sexual Activity  . Alcohol use: No    Alcohol/week: 0.0 standard drinks  . Drug use: No  . Sexual activity: Not Currently

## 2018-06-25 ENCOUNTER — Other Ambulatory Visit (INDEPENDENT_AMBULATORY_CARE_PROVIDER_SITE_OTHER): Payer: Self-pay

## 2018-06-25 ENCOUNTER — Telehealth (INDEPENDENT_AMBULATORY_CARE_PROVIDER_SITE_OTHER): Payer: Self-pay | Admitting: Orthopedic Surgery

## 2018-06-25 NOTE — Telephone Encounter (Signed)
[name], from [facility] left a message stating that they are taking care of the patient's left foot and wanted to know if Dr. Sharol Given wants him to take care of the right foot, if Dr. Sharol Given wants them to do wound care on that foot, he is requesting the orders be faxed over to him at [phone].  Thank you.

## 2018-06-29 NOTE — Telephone Encounter (Signed)
Yes, provide wound care for both feet

## 2018-06-29 NOTE — Telephone Encounter (Signed)
See below

## 2018-06-30 ENCOUNTER — Other Ambulatory Visit (INDEPENDENT_AMBULATORY_CARE_PROVIDER_SITE_OTHER): Payer: Self-pay

## 2018-06-30 NOTE — Telephone Encounter (Signed)
Done

## 2018-07-06 ENCOUNTER — Telehealth (INDEPENDENT_AMBULATORY_CARE_PROVIDER_SITE_OTHER): Payer: Self-pay | Admitting: Orthopedic Surgery

## 2018-07-06 NOTE — Telephone Encounter (Signed)
[facility]  [phone]     Louie Casa called from [facility] wanted to advise Dr.Duda the patient stated he is unable to wash his feet and change dressing. Louie Casa will be back this week check on the patient to see if he has changed dressing and washed his feet.

## 2018-07-08 NOTE — Telephone Encounter (Signed)
I called and sw Randy. He states that he is seeing the pt but dry dressings are not a skilled need. He states that he still has open areas on the feet and had been using iodosorb dressings and that he could go out and wash the pt's feet with dial soap and water and then apply iodosorb dressings twice a week. Advised that this is ok and HHN requested that I call the main office to advise of order change. Called and sw Lenna Sciara [phone] will update order for pt.

## 2018-07-20 ENCOUNTER — Ambulatory Visit (INDEPENDENT_AMBULATORY_CARE_PROVIDER_SITE_OTHER): Payer: Medicare Other | Admitting: Orthopedic Surgery

## 2018-07-20 ENCOUNTER — Encounter (INDEPENDENT_AMBULATORY_CARE_PROVIDER_SITE_OTHER): Payer: Self-pay | Admitting: Orthopedic Surgery

## 2018-07-20 VITALS — Ht 73.0 in | Wt 189.7 lb

## 2018-07-20 DIAGNOSIS — L97421 Non-pressure chronic ulcer of left heel and midfoot limited to breakdown of skin: Secondary | ICD-10-CM

## 2018-07-20 DIAGNOSIS — Z89432 Acquired absence of left foot: Secondary | ICD-10-CM | POA: Diagnosis not present

## 2018-07-20 DIAGNOSIS — L97411 Non-pressure chronic ulcer of right heel and midfoot limited to breakdown of skin: Secondary | ICD-10-CM

## 2018-07-20 DIAGNOSIS — Z9889 Other specified postprocedural states: Secondary | ICD-10-CM

## 2018-07-20 NOTE — Progress Notes (Signed)
Office Visit Note   Patient: [name]           Date of Birth: 09-01-1940           MRN: [id number] Visit Date: 07/20/2018              Requested by: [name], MD [address] [place], [place] [address] PCP: [name], MD  Chief Complaint  Patient presents with  . Right Foot - Pain, Follow-up  . Left Foot - Pain, Follow-up      HPI: Patient is a 78 year old gentleman who presents for 3 separate issues #1 he is status post right carpal tunnel release he states he is asymptomatic from this.  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  Patient states the wound on the left foot is worse than the right he denies any swelling.  Assessment & Plan: Visit Diagnoses:  1. Midfoot ulceration, left, limited to breakdown of skin ([place])   2. Midfoot skin ulcer, right, limited to breakdown of skin ([place])   3. Status post carpal tunnel release   4. Status post transmetatarsal amputation of left foot ([place])     Plan: Ulcers were debrided of skin and soft tissue x2 he will continue with his protective shoe wear.  Continue with wound care.  Follow-Up Instructions: Return in about 4 weeks (around 08/17/2018).   Ortho Exam  Patient is alert, oriented, no adenopathy, well-dressed, normal affect, normal respiratory effort. Examination patient has a plantigrade foot with ankle at 90 degrees.  He has a large Wegner grade 1 ulcer on the plantar aspect the left foot greater than the right foot.  There is no redness no cellulitis there is minimal swelling.  After informed consent a 10 blade knife was used to debride the skin and soft tissue back to healthy viable bleeding granulation tissue this was touched with silver nitrate the ulcer on the left foot is 3 x 2 cm and 3 mm deep the right foot ulcer is 2 cm in diameter 3 mm deep  Imaging: No results found. No images are attached to the  encounter.  Labs: Lab Results  Component Value Date   HGBA1C 5.2 05/29/2018   HGBA1C 5.4 01/30/2018   HGBA1C 8.1 (H) 08/23/2014   ESRSEDRATE 34 (H) 12/17/2017   ESRSEDRATE 45 (H) 09/27/2017   ESRSEDRATE 75 (H) 07/28/2017   CRP 17.1 (H) 12/17/2017   CRP 1.8 07/28/2017   CRP 15.8 (H) 09/06/2014   REPTSTATUS 10/25/2013 FINAL 10/24/2013   CULT NO GROWTH Performed at [facility] 10/24/2013     Lab Results  Component Value Date   ALBUMIN 3.5 05/28/2018   ALBUMIN 3.9 09/27/2017   ALBUMIN 3.7 06/24/2017    Body mass index is 25.03 kg/m.  Orders:  No orders of the defined types were placed in this encounter.  No orders of the defined types were placed in this encounter.    Procedures: No procedures performed  Clinical Data: No additional findings.  ROS:  All other systems negative, except as noted in the HPI. Review of Systems  Objective: Vital Signs: Ht 6\' 1"  (1.854 m)   Wt 189 lb 11.2 oz (86 kg)   BMI 25.03 kg/m   Specialty Comments:  No specialty comments available.  PMFS History: Patient Active Problem List   Diagnosis Date Noted  . NSTEMI (non-ST elevated myocardial infarction) ([place]) 05/28/2018  . Syncope 05/28/2018  .  HTN (hypertension) 05/28/2018  . Hyperlipemia, mixed 05/28/2018  . Rash and nonspecific skin eruption 12/16/2017  . Pain in right hand 09/11/2017  . Carpal tunnel syndrome, right upper limb 07/01/2017  . Arthritis of carpometacarpal [facility]) joint of left thumb 12/30/2016  . Midfoot skin ulcer, right, limited to breakdown of skin ([place]) 07/22/2016  . 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 Osteomyelitis of ankle or foot, left, acute ([place]) 08/23/2014  . Cellulitis 10/12/2012  . Chest pain 10/12/2012  . Tobacco abuse 10/12/2012  . ABSCESS, AXILLA, LEFT 02/23/2008  . 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 CHEST WALL PAIN, ACUTE 12/03/2007  . CANDIDIASIS, GLANS PENIS 09/16/2007  . HEADACHE 08/04/2007  . CHERRY  ANGIOMA 03/05/2007  . 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 Other and unspecified hyperlipidemia 03/05/2007  . GOUT 03/05/2007  . ERECTILE DYSFUNCTION 03/05/2007  . EXTERNAL HEMORRHOIDS 03/05/2007  . VENTRAL HERNIA 03/05/2007  . FATTY LIVER DISEASE 03/05/2007  . Osteoarthritis 03/05/2007  . HEMORRHOIDS, INTERNAL 03/17/2005  . COLONIC POLYPS, HX OF 03/17/2005   Past Medical History:  Diagnosis Date  . Arthritis   . Diabetic neuropathy ([place])   . High cholesterol   . Hypertension    denies  . Osteomyelitis (HCC)    Right great toe  . Peripheral vascular disease ([place])    diabetic  with osteomylitis  . Type II diabetes mellitus (HCC)     Family History  Problem Relation Age of Onset  . Diabetes Mellitus II Other   . Anesthesia problems Neg Hx     Past Surgical History:  Procedure Laterality Date  . AMPUTATION  07/23/2011   Procedure: AMPUTATION DIGIT;  Surgeon: Newt Minion, MD;  Location: [facility];  Service: Orthopedics;  Laterality: Right;  Right Great Toe Amputation  . AMPUTATION Right 09/18/2012   Procedure: Right Foot 2nd Ray Amputation;  Surgeon: Newt Minion, MD;  Location: [facility];  Service: Orthopedics;  Laterality: Right;  Right Foot 2nd Ray Amputation  . AMPUTATION Right 10/14/2012   Procedure: AMPUTATION FOOT;  Surgeon: Newt Minion, MD;  Location: [facility];  Service: Orthopedics;  Laterality: Right;  Right Foot Transmetatarsal Amputation  . AMPUTATION Left 09/23/2014   Procedure: Left Foot Transmetatarsal Amputation;  Surgeon: Newt Minion, MD;  Location: [facility];  Service: Orthopedics;  Laterality: Left;  . BACK SURGERY     lower  . CARPAL TUNNEL RELEASE Right 01/30/2018   Procedure: RIGHT CARPAL TUNNEL RELEASE;  Surgeon: Newt Minion, MD;  Location: [facility];  Service: Orthopedics;  Laterality: Right;  . CIRCUMCISION  2010  . COLONOSCOPY    . FOOT FRACTURE SURGERY Left 1970's   "broke it playing football" (10/12/2012)  .  HUMERUS FRACTURE SURGERY W/ IMPLANT Right 1960's   "put a plate in it" (8/75/6433)  . INCISION AND DRAINAGE OF WOUND Left 2006   "foot" (10/12/2012)  . LUMBAR [place] SURGERY  2008   Social History   Occupational History  . Not on file  Tobacco Use  . Smoking status: Current Every Day Smoker    Years: 12.00    Types: Pipe  . Smokeless tobacco: Never Used  Substance and Sexual Activity  . Alcohol use: No    Alcohol/week: 0.0 standard drinks  . Drug use: No  . Sexual activity: Not Currently

## 2018-08-07 ENCOUNTER — Telehealth (INDEPENDENT_AMBULATORY_CARE_PROVIDER_SITE_OTHER): Payer: Self-pay

## 2018-08-07 NOTE — Telephone Encounter (Signed)
HHN verbal ok to extend service wound care 1 wk 1, 2 wk 8 and 1 prn. To call with any questions.

## 2018-08-17 ENCOUNTER — Ambulatory Visit (INDEPENDENT_AMBULATORY_CARE_PROVIDER_SITE_OTHER): Payer: Medicare Other | Admitting: Orthopedic Surgery

## 2018-08-19 ENCOUNTER — Ambulatory Visit (INDEPENDENT_AMBULATORY_CARE_PROVIDER_SITE_OTHER): Payer: Medicare Other | Admitting: Orthopedic Surgery

## 2018-08-20 ENCOUNTER — Ambulatory Visit (INDEPENDENT_AMBULATORY_CARE_PROVIDER_SITE_OTHER): Payer: Medicare Other | Admitting: Orthopedic Surgery

## 2018-08-24 ENCOUNTER — Encounter (INDEPENDENT_AMBULATORY_CARE_PROVIDER_SITE_OTHER): Payer: Self-pay | Admitting: Orthopedic Surgery

## 2018-08-24 ENCOUNTER — Ambulatory Visit (INDEPENDENT_AMBULATORY_CARE_PROVIDER_SITE_OTHER): Payer: Medicare Other | Admitting: Orthopedic Surgery

## 2018-08-24 VITALS — Ht 73.0 in | Wt 189.0 lb

## 2018-08-24 DIAGNOSIS — L97411 Non-pressure chronic ulcer of right heel and midfoot limited to breakdown of skin: Secondary | ICD-10-CM | POA: Diagnosis not present

## 2018-08-24 DIAGNOSIS — Z89431 Acquired absence of right foot: Secondary | ICD-10-CM | POA: Diagnosis not present

## 2018-08-24 DIAGNOSIS — Z9889 Other specified postprocedural states: Secondary | ICD-10-CM

## 2018-08-24 DIAGNOSIS — Z89432 Acquired absence of left foot: Secondary | ICD-10-CM

## 2018-08-24 DIAGNOSIS — L97421 Non-pressure chronic ulcer of left heel and midfoot limited to breakdown of skin: Secondary | ICD-10-CM

## 2018-08-24 DIAGNOSIS — E1142 Type 2 diabetes mellitus with diabetic polyneuropathy: Secondary | ICD-10-CM

## 2018-08-24 MED ORDER — DOXYCYCLINE HYCLATE 100 MG PO CAPS: 100.0000 mg | ORAL_CAPSULE | Freq: Two times a day (BID) | ORAL | 1 refills | Status: DC

## 2018-08-24 NOTE — Progress Notes (Signed)
Office Visit Note   Patient: [name]           Date of Birth: April 14, 1941           MRN: [id number] Visit Date: 08/24/2018              Requested by: [name], MD [address] [place], [place] [address] PCP: [name], MD  Chief Complaint  Patient presents with  . Right Wrist - Follow-up    S/p CTR      HPI: The patient is a 78 year old gentleman who is seen for follow-up of bilateral foot ulcers over the plantar surface.  He has a history of bilateral transmetatarsal amputations.  He has diabetic insensate neuropathy.  He does report that there is been more drainage lately especially over the left foot ulcer and he is noted some drainage and increased callus formation.  He also reports some pain with the areas. He reports he has no problems with his wrist and hand following his CTR and is pleased with the result.  Assessment & Plan: Visit Diagnoses:  1. Midfoot ulceration, left, limited to breakdown of skin ([place])   2. Midfoot skin ulcer, right, limited to breakdown of skin ([place])   3. History of transmetatarsal amputation of left foot ([place])   4. History of transmetatarsal amputation of right foot (H[facility])   5. Status post carpal tunnel release   6. Type 2 diabetes mellitus with diabetic polyneuropathy, unspecified whether long term insulin use ([place])     Plan: After informed consent bilateral plantar ulcers were debrided with a #10 blade knife to bleeding healthier appearing tissue.  The patient tolerated this well.  He will continue his Silvadene dressing changes.  Home health is assisting with these twice weekly.  Will also start doxycycline 100 mg p.o. twice daily and this was sent to his pharmacy.  He will follow-up here in 2 weeks or sooner should he have difficulty in the interim.  Follow-Up Instructions: Return in about 2 weeks (around 09/07/2018).   Ortho Exam  Patient is alert, oriented, no adenopathy, well-dressed, normal affect,  normal respiratory effort. The left plantar ulcer is very large Waggoner grade 1 ulceration with peri-ulceration callus formation.  The ulcer over the left foot is at least 4 x 1 cm with approximately 4 to 5 mm of depth without tendon or exposed bone.  It was debrided with a #10 blade knife to bleeding healthier appearing tissue and the peri-wound callus was also debrided.  There is odor over the area but scant serous appearing drainage.  There is a large callus more proximally that is 2 cm in diameter and this was also debrided and does not appear to have an underlying ulcer currently. The right foot plantar ulcer, Wagner grade 1 was debrided back to healthy more viable appearing tissue.  The residual wound is approximately 2 cm diameter and 3 mm of depth with minimal odor odor and scant serous drainage. The patient has palpable dorsalis pedis pulses bilaterally.  Imaging: No results found. No images are attached to the encounter.  Labs: Lab Results  Component Value Date   HGBA1C 5.2 05/29/2018   HGBA1C 5.4 01/30/2018   HGBA1C 8.1 (H) 08/23/2014   ESRSEDRATE 34 (H) 12/17/2017   ESRSEDRATE 45 (H) 09/27/2017   ESRSEDRATE 75 (H) 07/28/2017   CRP 17.1 (H) 12/17/2017   CRP 1.8 07/28/2017   CRP 15.8 (H) 09/06/2014   REPTSTATUS 10/25/2013 FINAL 10/24/2013   CULT NO  GROWTH Performed at [facility] 10/24/2013     Lab Results  Component Value Date   ALBUMIN 3.5 05/28/2018   ALBUMIN 3.9 09/27/2017   ALBUMIN 3.7 06/24/2017    Body mass index is 24.94 kg/m.  Orders:  No orders of the defined types were placed in this encounter.  Meds ordered this encounter  Medications  . doxycycline (VIBRAMYCIN) 100 MG capsule    Sig: Take 1 capsule (100 mg total) by mouth 2 (two) times daily.    Dispense:  28 capsule    Refill:  1     Procedures: No procedures performed  Clinical Data: No additional findings.  ROS:  All other systems negative, except as noted in the HPI. Review  of Systems  Objective: Vital Signs: Ht 6\' 1"  (1.854 m)   Wt 189 lb (85.7 kg)   BMI 24.94 kg/m   Specialty Comments:  No specialty comments available.  PMFS History: Patient Active Problem List   Diagnosis Date Noted  . NSTEMI (non-ST elevated myocardial infarction) ([place]) 05/28/2018  . Syncope 05/28/2018  . HTN (hypertension) 05/28/2018  . Hyperlipemia, mixed 05/28/2018  . Rash and nonspecific skin eruption 12/16/2017  . Pain in right hand 09/11/2017  . Carpal tunnel syndrome, right upper limb 07/01/2017  . Arthritis of carpometacarpal [facility]) joint of left thumb 12/30/2016  . Midfoot skin ulcer, right, limited to breakdown of skin ([place]) 07/22/2016  . 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[place]) 09/23/2014  . Osteomyelitis of ankle or foot, left, acute ([place]) 08/23/2014  . Cellulitis 10/12/2012  . Chest pain 10/12/2012  . Tobacco abuse 10/12/2012  . ABSCESS, AXILLA, LEFT 02/23/2008  . 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 CHEST WALL PAIN, ACUTE 12/03/2007  . CANDIDIASIS, GLANS PENIS 09/16/2007  . HEADACHE 08/04/2007  . 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 Other and unspecified hyperlipidemia 03/05/2007  . GOUT 03/05/2007  . ERECTILE DYSFUNCTION 03/05/2007  . EXTERNAL HEMORRHOIDS 03/05/2007  . VENTRAL HERNIA 03/05/2007  . FATTY LIVER DISEASE 03/05/2007  . Osteoarthritis 03/05/2007  . HEMORRHOIDS, INTERNAL 03/17/2005  . COLONIC POLYPS, HX OF 03/17/2005   Past Medical History:  Diagnosis Date  . Arthritis   . Diabetic neuropathy ([place])   . High cholesterol   . Hypertension    denies  . Osteomyelitis (HCC)    Right great toe  . Peripheral vascular disease ([place])    diabetic  with osteomylitis  . Type II diabetes mellitus (HCC)     Family History  Problem Relation Age of Onset  . Diabetes Mellitus II Other   . Anesthesia problems Neg Hx     Past Surgical History:  Procedure  Laterality Date  . AMPUTATION  07/23/2011   Procedure: AMPUTATION DIGIT;  Surgeon: Newt Minion, MD;  Location: [place];  Service: Orthopedics;  Laterality: Right;  Right Great Toe Amputation  . AMPUTATION Right 09/18/2012   Procedure: Right Foot 2nd Ray Amputation;  Surgeon: Newt Minion, MD;  Location: [facility];  Service: Orthopedics;  Laterality: Right;  Right Foot 2nd Ray Amputation  . AMPUTATION Right 10/14/2012   Procedure: AMPUTATION FOOT;  Surgeon: Newt Minion, MD;  Location: [facility];  Service: Orthopedics;  Laterality: Right;  Right Foot Transmetatarsal Amputation  . AMPUTATION Left 09/23/2014   Procedure: Left Foot Transmetatarsal Amputation;  Surgeon: Newt Minion, MD;  Location: [facility];  Service: Orthopedics;  Laterality: Left;  . BACK SURGERY  lower  . CARPAL TUNNEL RELEASE Right 01/30/2018   Procedure: RIGHT CARPAL TUNNEL RELEASE;  Surgeon: Newt Minion, MD;  Location: [facility];  Service: Orthopedics;  Laterality: Right;  . CIRCUMCISION  2010  . COLONOSCOPY    . FOOT FRACTURE SURGERY Left 1970's   "broke it playing football" (10/12/2012)  . HUMERUS FRACTURE SURGERY W/ IMPLANT Right 1960's   "put a plate in it" (07/17/8655)  . INCISION AND DRAINAGE OF WOUND Left 2006   "foot" (10/12/2012)  . LUMBAR [place] SURGERY  2008   Social History   Occupational History  . Not on file  Tobacco Use  . Smoking status: Current Every Day Smoker    Years: 12.00    Types: Pipe  . Smokeless tobacco: Never Used  Substance and Sexual Activity  . Alcohol use: No    Alcohol/week: 0.0 standard drinks  . Drug use: No  . Sexual activity: Not Currently

## 2018-09-04 ENCOUNTER — Telehealth (INDEPENDENT_AMBULATORY_CARE_PROVIDER_SITE_OTHER): Payer: Self-pay | Admitting: Radiology

## 2018-09-04 NOTE — Telephone Encounter (Signed)
I called patient to remind him of his 9am appt with Shawn Rayburn, PA-C on 09/07/2018. Patient is coming to appt. He answered "No" to all COVID-19  screening questions.

## 2018-09-07 ENCOUNTER — Ambulatory Visit (INDEPENDENT_AMBULATORY_CARE_PROVIDER_SITE_OTHER): Payer: Medicare Other | Admitting: Physician Assistant

## 2018-10-05 ENCOUNTER — Telehealth (INDEPENDENT_AMBULATORY_CARE_PROVIDER_SITE_OTHER): Payer: Self-pay | Admitting: Orthopedic Surgery

## 2018-10-05 NOTE — Telephone Encounter (Signed)
[name] from [place] at [facility] called stating that one of his nurses called to get VO for nursing care and wanted to know if it was okay to proceed with the nursing care.  CB#[phone].  Thank you.

## 2018-10-05 NOTE — Telephone Encounter (Signed)
I called and sw [name] to advise verbal ok. [facility]

## 2018-10-07 ENCOUNTER — Telehealth (INDEPENDENT_AMBULATORY_CARE_PROVIDER_SITE_OTHER): Payer: Self-pay | Admitting: Orthopedic Surgery

## 2018-10-07 NOTE — Telephone Encounter (Signed)
Patient called requesting an RX refill on his doxycycline.  Patient uses [facility] in [place].  OE#[phone].  Thank you.

## 2018-10-09 ENCOUNTER — Telehealth (INDEPENDENT_AMBULATORY_CARE_PROVIDER_SITE_OTHER): Payer: Self-pay

## 2018-10-09 NOTE — Telephone Encounter (Signed)
I explained to patient that we will need to schedule him an appt to come in if he needs more ABX at this time due to his last visit he was supplied with Qty#28 of Doxycycline. He stated that he did not want to come in due to COVID-19 and he will wait until June to try to come and that is understandable. I informed patient that if he suspects he does has an infection in which he denies at this time he should come in to be seen. Patient understood and stated he was fine and he just wanted more ABX.

## 2018-10-09 NOTE — Telephone Encounter (Signed)
Patient was called, please refer to notes. Thank you

## 2018-10-16 DIAGNOSIS — I639 Cerebral infarction, unspecified: Secondary | ICD-10-CM

## 2018-10-16 HISTORY — DX: Cerebral infarction, unspecified: I63.9

## 2018-10-23 ENCOUNTER — Telehealth: Payer: Self-pay | Admitting: Orthopedic Surgery

## 2018-10-23 NOTE — Telephone Encounter (Signed)
I called and sw [name] at [facility] to see if pt is still under services. If so to add a HHA to assist with bathing and also a Education officer, museum to see if the pt is safe at home and possible eval for other community based programs that can help the pt while at home. I will hold this message pending a return call to advise if hot still under service will fax an order to them today and notify pt.

## 2018-10-23 NOTE — Telephone Encounter (Signed)
Patient called to state he needs help at home. Perhaps Home Health care. He can't bathe himself and has problems being independent.  Please call patient at [phone]

## 2018-10-26 ENCOUNTER — Telehealth: Payer: Self-pay | Admitting: Orthopedic Surgery

## 2018-10-26 NOTE — Telephone Encounter (Signed)
The nurse called stating they got the results back for the pt wound and if we also received them.  And if Dr Sharol Given is going to call any antibiotics in

## 2018-10-26 NOTE — Telephone Encounter (Signed)
I called pt to make an appt for tomorrow per Dr. Sharol Given. Can you please at him on at 9:15 tomorrow please?

## 2018-10-26 NOTE — Telephone Encounter (Signed)
Received confirmation that the pt is still under service and home health agency will contact pt to arrange for HHA and social work.

## 2018-10-26 NOTE — Telephone Encounter (Signed)
Labs to Dr. Sharol Given for review will call with recommendation.

## 2018-10-27 ENCOUNTER — Ambulatory Visit (INDEPENDENT_AMBULATORY_CARE_PROVIDER_SITE_OTHER): Payer: Medicare Other

## 2018-10-27 ENCOUNTER — Ambulatory Visit (INDEPENDENT_AMBULATORY_CARE_PROVIDER_SITE_OTHER): Payer: Medicare Other | Admitting: Physician Assistant

## 2018-10-27 ENCOUNTER — Other Ambulatory Visit: Payer: Self-pay

## 2018-10-27 ENCOUNTER — Encounter: Payer: Self-pay | Admitting: Orthopedic Surgery

## 2018-10-27 ENCOUNTER — Telehealth: Payer: Self-pay

## 2018-10-27 VITALS — Ht 73.0 in | Wt 189.0 lb

## 2018-10-27 DIAGNOSIS — L97421 Non-pressure chronic ulcer of left heel and midfoot limited to breakdown of skin: Secondary | ICD-10-CM | POA: Diagnosis not present

## 2018-10-27 DIAGNOSIS — L97411 Non-pressure chronic ulcer of right heel and midfoot limited to breakdown of skin: Secondary | ICD-10-CM | POA: Diagnosis not present

## 2018-10-27 DIAGNOSIS — E11610 Type 2 diabetes mellitus with diabetic neuropathic arthropathy: Secondary | ICD-10-CM | POA: Diagnosis not present

## 2018-10-27 DIAGNOSIS — E1142 Type 2 diabetes mellitus with diabetic polyneuropathy: Secondary | ICD-10-CM

## 2018-10-27 MED ORDER — AMOXICILLIN-POT CLAVULANATE 875-125 MG PO TABS: 1.0000 | ORAL_TABLET | Freq: Two times a day (BID) | ORAL | 1 refills | Status: DC

## 2018-10-27 NOTE — Telephone Encounter (Signed)
[name] questioned if the pt was given an ABX at today's visit. I advised that yes, he had been started on Augmentin 875mg  bid and that he was to follow upin the office on 11/02/18 eval for possible MRI. I advised that if things progressed and got worse to have the nurse call and we can bring him back in again for the pt to see Dr. Sharol Given.

## 2018-10-27 NOTE — Progress Notes (Signed)
Office Visit Note   Patient: [name]           Date of Birth: 09-19-1940           MRN: [id number] Visit Date: 10/27/2018              Requested by: [name], MD [address] [place], [place] [address] PCP: [name], MD  Chief Complaint  Patient presents with  . Left Foot - Wound Check  . Right Foot - Wound Check      HPI: Patient is a 78 year old gentleman who is seen for follow-up of bilateral foot ulcerations over the plantar surface.  He previously underwent bilateral transmetatarsal amputations remotely.  He has diabetic insensate polyneuropathy.  He is continued to have more drainage over both feet.  His home health nurse did culture the drainage and this has grown Proteus mirabilis and Staphylococcus simulans both appear to be sensitive to Augmentin.  He reports home health is doing dressing changes with some type of cream to the feet and Allevyn dressings.  He reports some high fever at times and pain and swelling over his feet over the past couple of weeks.  He is afebrile today. Assessment & Plan: Visit Diagnoses:  1. Midfoot ulceration, left, limited to breakdown of skin ([place])   2. Midfoot skin ulcer, right, limited to breakdown of skin ([place])     Plan: Begin Augmentin 875 mg BID, continue daily dressing changes with silvadene, follow up early next week.  May need MRI scan to help evaluate for osteomyelitis.  We discussed that he may require surgical intervention at some point but will try oral antibiotics and monitor his response closely.  Follow-Up Instructions: Return in about 6 days (around 11/02/2018).   Ortho Exam  Patient is alert, oriented, no adenopathy, well-dressed, normal affect, normal respiratory effort. Left foot has a central ulceration which is approximately 4 cm x 1 cm with point 4 mm of depth with epiboly of the wound edges and moderate bloody tan drainage.  There is odor.  After informed consent a #10 blade  knife was used to debride the skin and soft tissue back to healthier appearing viable bleeding granulation and this was touched with silver nitrate. The right foot ulceration is over the medial residual transtibial amputation and is approximately 2 cm in diameter and a few millimeters of depth.  There is some drainage and odor with this but the left side is more concerning. He has palpable dorsalis pedis pulses bilaterally. Imaging: No results found. No images are attached to the encounter.  Labs: Lab Results  Component Value Date   HGBA1C 5.2 05/29/2018   HGBA1C 5.4 01/30/2018   HGBA1C 8.1 (H) 08/23/2014   ESRSEDRATE 34 (H) 12/17/2017   ESRSEDRATE 45 (H) 09/27/2017   ESRSEDRATE 75 (H) 07/28/2017   CRP 17.1 (H) 12/17/2017   CRP 1.8 07/28/2017   CRP 15.8 (H) 09/06/2014   REPTSTATUS 10/25/2013 FINAL 10/24/2013   CULT NO GROWTH Performed at [facility] 10/24/2013     Lab Results  Component Value Date   ALBUMIN 3.5 05/28/2018   ALBUMIN 3.9 09/27/2017   ALBUMIN 3.7 06/24/2017    Body mass index is 24.94 kg/m.  Orders:  Orders Placed This Encounter  Procedures  . XR Foot Complete Left  . XR Foot Complete Right   Meds ordered this encounter  Medications  . amoxicillin-clavulanate (AUGMENTIN) 875-125 MG tablet    Sig: Take 1 tablet by mouth 2 (  two) times daily.    Dispense:  28 tablet    Refill:  1     Procedures: No procedures performed  Clinical Data: No additional findings.  ROS:  All other systems negative, except as noted in the HPI. Review of Systems  Objective: Vital Signs: Ht 6\' 1"  (1.854 m)   Wt 189 lb (85.7 kg)   BMI 24.94 kg/m   Specialty Comments:  No specialty comments available.  PMFS History: Patient Active Problem List   Diagnosis Date Noted  . NSTEMI (non-ST elevated myocardial infarction) ([place]) 05/28/2018  . Syncope 05/28/2018  . HTN (hypertension) 05/28/2018  . Hyperlipemia, mixed 05/28/2018  . Rash and nonspecific skin  eruption 12/16/2017  . Pain in right hand 09/11/2017  . Carpal tunnel syndrome, right upper limb 07/01/2017  . Arthritis of carpometacarpal [facility]) joint of left thumb 12/30/2016  . Midfoot skin ulcer, right, limited to breakdown of skin ([place]) 07/22/2016  . 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[place]) 09/23/2014  . Osteomyelitis of ankle or foot, left, acute ([place]) 08/23/2014  . Cellulitis 10/12/2012  . Chest pain 10/12/2012  . Tobacco abuse 10/12/2012  . ABSCESS, AXILLA, LEFT 02/23/2008  . 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 CHEST WALL PAIN, ACUTE 12/03/2007  . CANDIDIASIS, GLANS PENIS 09/16/2007  . HEADACHE 08/04/2007  . 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 ([place]) 03/05/2007  . Other and unspecified hyperlipidemia 03/05/2007  . GOUT 03/05/2007  . ERECTILE DYSFUNCTION 03/05/2007  . EXTERNAL HEMORRHOIDS 03/05/2007  . VENTRAL HERNIA 03/05/2007  . FATTY LIVER DISEASE 03/05/2007  . Osteoarthritis 03/05/2007  . HEMORRHOIDS, INTERNAL 03/17/2005  . COLONIC POLYPS, HX OF 03/17/2005   Past Medical History:  Diagnosis Date  . Arthritis   . Diabetic neuropathy ([place])   . High cholesterol   . Hypertension    denies  . Osteomyelitis (HCC)    Right great toe  . Peripheral vascular disease ([place])    diabetic  with osteomylitis  . Type II diabetes mellitus (HCC)     Family History  Problem Relation Age of Onset  . Diabetes Mellitus II Other   . Anesthesia problems Neg Hx     Past Surgical History:  Procedure Laterality Date  . AMPUTATION  07/23/2011   Procedure: AMPUTATION DIGIT;  Surgeon: Newt Minion, MD;  Location: [facility];  Service: Orthopedics;  Laterality: Right;  Right Great Toe Amputation  . AMPUTATION Right 09/18/2012   Procedure: Right Foot 2nd Ray Amputation;  Surgeon: Newt Minion, MD;  Location: [facility];  Service: Orthopedics;  Laterality: Right;  Right Foot 2nd Ray Amputation  . AMPUTATION Right 10/14/2012    Procedure: AMPUTATION FOOT;  Surgeon: Newt Minion, MD;  Location: [facility];  Service: Orthopedics;  Laterality: Right;  Right Foot Transmetatarsal Amputation  . AMPUTATION Left 09/23/2014   Procedure: Left Foot Transmetatarsal Amputation;  Surgeon: Newt Minion, MD;  Location: [place] [place];  Service: Orthopedics;  Laterality: Left;  . BACK SURGERY     lower  . CARPAL TUNNEL RELEASE Right 01/30/2018   Procedure: RIGHT CARPAL TUNNEL RELEASE;  Surgeon: Newt Minion, MD;  Location: [facility];  Service: Orthopedics;  Laterality: Right;  . CIRCUMCISION  2010  . COLONOSCOPY    . FOOT FRACTURE SURGERY Left 1970's   "broke it playing football" (10/12/2012)  . HUMERUS FRACTURE SURGERY W/ IMPLANT Right 1960's   "put a plate in it" (0/03/2724)  . INCISION AND DRAINAGE OF WOUND Left 2006   "  foot" (10/12/2012)  . LUMBAR [place] SURGERY  2008   Social History   Occupational History  . Not on file  Tobacco Use  . Smoking status: Current Every Day Smoker    Years: 12.00    Types: Pipe  . Smokeless tobacco: Never Used  Substance and Sexual Activity  . Alcohol use: No    Alcohol/week: 0.0 standard drinks  . Drug use: No  . Sexual activity: Not Currently

## 2018-10-28 ENCOUNTER — Encounter: Payer: Self-pay | Admitting: Orthopedic Surgery

## 2018-10-30 ENCOUNTER — Telehealth: Payer: Self-pay | Admitting: Orthopedic Surgery

## 2018-10-30 NOTE — Telephone Encounter (Signed)
I called patient and explain to him that he needs to be seen by family doctor for his eyes and he understood and stated he will see them on Monday.

## 2018-10-30 NOTE — Telephone Encounter (Signed)
Pt called asking if Mendel Ryder can call in him some medication for his eyes.  Pt is saying " his eyes are kinda blurry "

## 2018-10-31 ENCOUNTER — Observation Stay (HOSPITAL_COMMUNITY): Payer: Medicare Other

## 2018-10-31 ENCOUNTER — Emergency Department (HOSPITAL_COMMUNITY): Payer: Medicare Other

## 2018-10-31 ENCOUNTER — Other Ambulatory Visit: Payer: Self-pay

## 2018-10-31 ENCOUNTER — Encounter (HOSPITAL_COMMUNITY): Payer: Self-pay | Admitting: Emergency Medicine

## 2018-10-31 ENCOUNTER — Observation Stay (HOSPITAL_COMMUNITY)
Admission: EM | Admit: 2018-10-31 | Discharge: 2018-11-01 | Disposition: A | Discharge status: Home / Self Care | Payer: Medicare Other | Attending: Family Medicine | Admitting: Family Medicine

## 2018-10-31 DIAGNOSIS — H532 Diplopia: Secondary | ICD-10-CM

## 2018-10-31 DIAGNOSIS — Z1159 Encounter for screening for other viral diseases: Secondary | ICD-10-CM | POA: Insufficient documentation

## 2018-10-31 DIAGNOSIS — I639 Cerebral infarction, unspecified: Secondary | ICD-10-CM | POA: Diagnosis not present

## 2018-10-31 DIAGNOSIS — Z7982 Long term (current) use of aspirin: Secondary | ICD-10-CM | POA: Diagnosis not present

## 2018-10-31 DIAGNOSIS — E1159 Type 2 diabetes mellitus with other circulatory complications: Secondary | ICD-10-CM

## 2018-10-31 DIAGNOSIS — E782 Mixed hyperlipidemia: Secondary | ICD-10-CM | POA: Diagnosis present

## 2018-10-31 DIAGNOSIS — E785 Hyperlipidemia, unspecified: Secondary | ICD-10-CM

## 2018-10-31 DIAGNOSIS — I63439 Cerebral infarction due to embolism of unspecified posterior cerebral artery: Principal | ICD-10-CM | POA: Insufficient documentation

## 2018-10-31 DIAGNOSIS — F17210 Nicotine dependence, cigarettes, uncomplicated: Secondary | ICD-10-CM | POA: Diagnosis not present

## 2018-10-31 DIAGNOSIS — E119 Type 2 diabetes mellitus without complications: Secondary | ICD-10-CM | POA: Diagnosis not present

## 2018-10-31 DIAGNOSIS — Z89439 Acquired absence of unspecified foot: Secondary | ICD-10-CM

## 2018-10-31 DIAGNOSIS — R2689 Other abnormalities of gait and mobility: Secondary | ICD-10-CM | POA: Insufficient documentation

## 2018-10-31 DIAGNOSIS — Z720 Tobacco use: Secondary | ICD-10-CM | POA: Diagnosis present

## 2018-10-31 DIAGNOSIS — Z79899 Other long term (current) drug therapy: Secondary | ICD-10-CM | POA: Insufficient documentation

## 2018-10-31 DIAGNOSIS — H538 Other visual disturbances: Secondary | ICD-10-CM

## 2018-10-31 LAB — SARS CORONAVIRUS 2 BY RT PCR (HOSPITAL ORDER, PERFORMED IN ~~LOC~~ HOSPITAL LAB): SARS Coronavirus 2: NEGATIVE

## 2018-10-31 LAB — CBC WITH DIFFERENTIAL/PLATELET
Abs Immature Granulocytes: 0.01 10*3/uL (ref 0.00–0.07)
Basophils Absolute: 0.1 10*3/uL (ref 0.0–0.1)
Basophils Relative: 1 %
Eosinophils Absolute: 1.4 10*3/uL — ABNORMAL HIGH (ref 0.0–0.5)
Eosinophils Relative: 26 %
HCT: 35.3 % — ABNORMAL LOW (ref 39.0–52.0)
Hemoglobin: 11 g/dL — ABNORMAL LOW (ref 13.0–17.0)
Immature Granulocytes: 0 %
Lymphocytes Relative: 28 %
Lymphs Abs: 1.5 10*3/uL (ref 0.7–4.0)
MCH: 27.6 pg (ref 26.0–34.0)
MCHC: 31.2 g/dL (ref 30.0–36.0)
MCV: 88.5 fL (ref 80.0–100.0)
Monocytes Absolute: 0.6 10*3/uL (ref 0.1–1.0)
Monocytes Relative: 10 %
Neutro Abs: 1.9 10*3/uL (ref 1.7–7.7)
Neutrophils Relative %: 35 %
Platelets: 230 10*3/uL (ref 150–400)
RBC: 3.99 MIL/uL — ABNORMAL LOW (ref 4.22–5.81)
RDW: 15.8 % — ABNORMAL HIGH (ref 11.5–15.5)
WBC: 5.5 10*3/uL (ref 4.0–10.5)
nRBC: 0 % (ref 0.0–0.2)

## 2018-10-31 LAB — COMPREHENSIVE METABOLIC PANEL
ALT: 11 U/L (ref 0–44)
AST: 15 U/L (ref 15–41)
Albumin: 3.2 g/dL — ABNORMAL LOW (ref 3.5–5.0)
Alkaline Phosphatase: 44 U/L (ref 38–126)
Anion gap: 8 (ref 5–15)
BUN: 8 mg/dL (ref 8–23)
CO2: 24 mmol/L (ref 22–32)
Calcium: 8.8 mg/dL — ABNORMAL LOW (ref 8.9–10.3)
Chloride: 105 mmol/L (ref 98–111)
Creatinine, Ser: 1.26 mg/dL — ABNORMAL HIGH (ref 0.61–1.24)
GFR calc Af Amer: >60 mL/min (ref >60)
GFR calc non Af Amer: 55 mL/min — ABNORMAL LOW (ref >60)
Glucose, Bld: 96 mg/dL (ref 70–99)
Potassium: 3.7 mmol/L (ref 3.5–5.1)
Sodium: 137 mmol/L (ref 135–145)
Total Bilirubin: 0.8 mg/dL (ref 0.3–1.2)
Total Protein: 7.4 g/dL (ref 6.5–8.1)

## 2018-10-31 LAB — GLUCOSE, CAPILLARY
Glucose-Capillary: 68 mg/dL — ABNORMAL LOW (ref 70–99)
Glucose-Capillary: 86 mg/dL (ref 70–99)

## 2018-10-31 LAB — TSH: TSH: 0.958 u[IU]/mL (ref 0.350–4.500)

## 2018-10-31 MED ORDER — PROPARACAINE HCL 0.5 % OP SOLN
1.0000 [drp] | Freq: Once | OPHTHALMIC | Status: AC
  Administered 2018-10-31: 1 [drp] via OPHTHALMIC
  Filled 2018-10-31: qty 15

## 2018-10-31 MED ORDER — INSULIN ASPART 100 UNIT/ML ~~LOC~~ SOLN: 0.0000 [IU] | Freq: Three times a day (TID) | SUBCUTANEOUS | Status: DC

## 2018-10-31 MED ORDER — FLUORESCEIN SODIUM 1 MG OP STRP
1.0000 | ORAL_STRIP | Freq: Once | OPHTHALMIC | Status: AC
  Administered 2018-10-31: 14:00:00 1 via OPHTHALMIC
  Filled 2018-10-31: qty 1

## 2018-10-31 MED ORDER — ACETAMINOPHEN 650 MG RE SUPP: 650.0000 mg | RECTAL | Status: DC | PRN

## 2018-10-31 MED ORDER — TIMOLOL MALEATE 0.5 % OP SOLN
1.0000 [drp] | Freq: Two times a day (BID) | OPHTHALMIC | Status: DC
  Administered 2018-10-31 – 2018-11-01 (×2): 1 [drp] via OPHTHALMIC
  Filled 2018-10-31: qty 5

## 2018-10-31 MED ORDER — ACETAMINOPHEN 325 MG PO TABS: 650.0000 mg | ORAL_TABLET | ORAL | Status: DC | PRN

## 2018-10-31 MED ORDER — BRIMONIDINE TARTRATE 0.15 % OP SOLN
1.0000 [drp] | Freq: Two times a day (BID) | OPHTHALMIC | Status: DC
  Administered 2018-10-31 – 2018-11-01 (×2): 1 [drp] via OPHTHALMIC
  Filled 2018-10-31: qty 5

## 2018-10-31 MED ORDER — ENOXAPARIN SODIUM 40 MG/0.4ML ~~LOC~~ SOLN
40.0000 mg | SUBCUTANEOUS | Status: DC
  Administered 2018-10-31: 40 mg via SUBCUTANEOUS
  Filled 2018-10-31: qty 0.4

## 2018-10-31 MED ORDER — IOHEXOL 350 MG/ML SOLN
100.0000 mL | Freq: Once | INTRAVENOUS | Status: AC | PRN
  Administered 2018-10-31: 19:00:00 100 mL via INTRAVENOUS

## 2018-10-31 MED ORDER — STROKE: EARLY STAGES OF RECOVERY BOOK
Freq: Once | Status: DC
  Filled 2018-10-31: qty 1

## 2018-10-31 MED ORDER — SENNOSIDES-DOCUSATE SODIUM 8.6-50 MG PO TABS: 1.0000 | ORAL_TABLET | Freq: Every evening | ORAL | Status: DC | PRN

## 2018-10-31 MED ORDER — ASPIRIN EC 81 MG PO TBEC
81.0000 mg | DELAYED_RELEASE_TABLET | Freq: Every day | ORAL | Status: DC
  Administered 2018-11-01: 81 mg via ORAL
  Filled 2018-10-31: qty 1

## 2018-10-31 MED ORDER — GADOBUTROL 1 MMOL/ML IV SOLN
9.0000 mL | Freq: Once | INTRAVENOUS | Status: AC | PRN
  Administered 2018-10-31: 9 mL via INTRAVENOUS

## 2018-10-31 MED ORDER — LATANOPROST 0.005 % OP SOLN
1.0000 [drp] | Freq: Every day | OPHTHALMIC | Status: DC
  Administered 2018-10-31: 1 [drp] via OPHTHALMIC
  Filled 2018-10-31: qty 2.5

## 2018-10-31 MED ORDER — ACETAMINOPHEN 160 MG/5ML PO SOLN: 650.0000 mg | ORAL | Status: DC | PRN

## 2018-10-31 MED ORDER — SODIUM CHLORIDE 0.9 % IV SOLN
INTRAVENOUS | Status: AC
  Administered 2018-10-31: 21:00:00 via INTRAVENOUS

## 2018-10-31 MED ORDER — ATORVASTATIN CALCIUM 40 MG PO TABS: 40.0000 mg | ORAL_TABLET | Freq: Every day | ORAL | Status: DC

## 2018-10-31 NOTE — Progress Notes (Signed)
Hypoglycemic Event  CBG: 68 mg/dl  Treatment: 8 oz juice/soda  Symptoms: None  Follow-up CBG: [id number] CBG Result:86  Possible Reasons for Event: Inadequate meal intake  Comments/MD notified:Schorr NP     Tad Devora

## 2018-10-31 NOTE — ED Provider Notes (Signed)
Patient CARE signed out to follow-up MRI results for final disposition.  MRI results reviewed showing subacute/acute stroke occipital region.  Patient did present with vision changes.  Discussed the case with neurology for consult and hospitalist for admission.  Patient stable on reassessment and understands and agrees to admission.  Golda Acre, MD 10/31/18 1728

## 2018-10-31 NOTE — ED Notes (Signed)
Patient transported to CT 

## 2018-10-31 NOTE — ED Notes (Signed)
Pt contacted his family and advised him of what was going on with him.

## 2018-10-31 NOTE — ED Notes (Signed)
ED TO INPATIENT HANDOFF REPORT  ED Nurse Name and Phone #: [phone]  S Name/Age/Gender [name] 78 y.o. male Room/Bed: 020C/020C  Code Status   Code Status: Prior  Home/SNF/Other Home Patient oriented to: self, place, time and situation Is this baseline? Yes   Triage Complete: Triage complete  Chief Complaint blurred vision  Triage Note Pt present with 3-4 months of blurred vision. Pt reports that over the last 7-8 days the blurred vision has gotten worse.    Allergies No Known Allergies  Level of Care/Admitting Diagnosis ED Disposition    ED Disposition Condition [facility]: [place] [100100]  Level of Care: Telemetry Medical [104]  I expect the patient will be discharged within 24 hours: Yes  LOW acuity---Tx typically complete <24 hrs---ACUTE conditions typically can be evaluated <24 hours---LABS likely to return to acceptable levels <24 hours---IS near functional baseline---EXPECTED to return to current living arrangement---NOT newly hypoxic: Does not meet criteria for 5C-Observation unit  Covid Evaluation: Screening Protocol (No Symptoms)  Diagnosis: Occipital stroke [facility]) [254270]  Admitting Physician: [place] ERIC J [[id number]]  Attending Physician: [place] ERIC J [[id number]]  PT Class (Do Not Modify): Observation [104]  PT Acc Code (Do Not Modify): Observation [10022]       B Medical/Surgery History Past Medical History:  Diagnosis Date  . Arthritis   . Diabetic neuropathy ([place])   . High cholesterol   . Hypertension    denies  . Osteomyelitis ([facility])    Right great toe  . Peripheral vascular disease ([place])    diabetic  with osteomylitis  . Type II diabetes mellitus ([place])    Past Surgical History:  Procedure Laterality Date  . AMPUTATION  07/23/2011   Procedure: AMPUTATION DIGIT;  Surgeon: Newt Minion, MD;  Location: [place];  Service: Orthopedics;  Laterality: Right;  Right Great Toe Amputation  . AMPUTATION  Right 09/18/2012   Procedure: Right Foot 2nd Ray Amputation;  Surgeon: Newt Minion, MD;  Location: [place];  Service: Orthopedics;  Laterality: Right;  Right Foot 2nd Ray Amputation  . AMPUTATION Right 10/14/2012   Procedure: AMPUTATION FOOT;  Surgeon: Newt Minion, MD;  Location: [place];  Service: Orthopedics;  Laterality: Right;  Right Foot Transmetatarsal Amputation  . AMPUTATION Left 09/23/2014   Procedure: Left Foot Transmetatarsal Amputation;  Surgeon: Newt Minion, MD;  Location: [place];  Service: Orthopedics;  Laterality: Left;  . BACK SURGERY     lower  . CARPAL TUNNEL RELEASE Right 01/30/2018   Procedure: RIGHT CARPAL TUNNEL RELEASE;  Surgeon: Newt Minion, MD;  Location: [place];  Service: Orthopedics;  Laterality: Right;  . CIRCUMCISION  2010  . COLONOSCOPY    . FOOT FRACTURE SURGERY Left 1970's   "broke it playing football" (10/12/2012)  . HUMERUS FRACTURE SURGERY W/ IMPLANT Right 1960's   "put a plate in it" (11/21/3708)  . INCISION AND DRAINAGE OF WOUND Left 2006   "foot" (10/12/2012)  . LUMBAR [place] SURGERY  2008     A IV Location/Drains/Wounds Patient Lines/Drains/Airways Status   Active Line/Drains/Airways    Name:   Placement date:   Placement time:   Site:   Days:   Peripheral IV 10/15/12 Right;Lateral Forearm   10/15/12    1601    Forearm   2207   Peripheral IV 10/20/13 Right Forearm   10/20/13    1700    Forearm   1837   Incision 07/23/11 Foot Right  07/23/11    1840     2657   Incision 09/18/12 Foot Right   09/18/12    1811     2234   Incision 10/14/12 Foot Right   10/14/12    0853     2208   Incision 10/14/12 Foot Right   10/14/12    1000     2208   Incision (Closed) 09/23/14 Foot Left   09/23/14    1212     1499   Incision (Closed) 01/30/18 Hand Right   01/30/18    0912     274   Wound 10/12/12 Amputation;Other (Comment) Foot Right patient has wound to bottom of foot, 4x2 cm. odor, draining   10/12/12    0209    Foot   2210   Wound 10/14/12 Amputation Foot Right  midfoot amputation   10/14/12    1000    Foot   2208   Wound / Incision (Open or Dehisced) 05/28/18 Non-pressure wound;Other (Comment) Foot Right   05/28/18    2140    Foot   156   Wound / Incision (Open or Dehisced) 05/28/18 Non-pressure wound;Other (Comment) Foot Left   05/28/18    2140    Foot   156   Wound / Incision (Open or Dehisced) 05/30/18 Non-pressure wound Elbow Left;Posterior abrasion sustained when he fell at home prior to adm.   05/30/18    1100    Elbow   154          Intake/Output Last 24 hours No intake or output data in the 24 hours ending 10/31/18 1724  Labs/Imaging Results for orders placed or performed during the hospital encounter of 10/31/18 (from the past 48 hour(s))  CBC with Differential     Status: Abnormal   Collection Time: 10/31/18  1:06 PM  Result Value Ref Range   WBC 5.5 4.0 - 10.5 K/uL   RBC 3.99 (L) 4.22 - 5.81 MIL/uL   Hemoglobin 11.0 (L) 13.0 - 17.0 g/dL   HCT 35.3 (L) 39.0 - 52.0 %   MCV 88.5 80.0 - 100.0 fL   MCH 27.6 26.0 - 34.0 pg   MCHC 31.2 30.0 - 36.0 g/dL   RDW 15.8 (H) 11.5 - 15.5 %   Platelets 230 150 - 400 K/uL   nRBC 0.0 0.0 - 0.2 %   Neutrophils Relative % 35 %   Neutro Abs 1.9 1.7 - 7.7 K/uL   Lymphocytes Relative 28 %   Lymphs Abs 1.5 0.7 - 4.0 K/uL   Monocytes Relative 10 %   Monocytes Absolute 0.6 0.1 - 1.0 K/uL   Eosinophils Relative 26 %   Eosinophils Absolute 1.4 (H) 0.0 - 0.5 K/uL   Basophils Relative 1 %   Basophils Absolute 0.1 0.0 - 0.1 K/uL   Immature Granulocytes 0 %   Abs Immature Granulocytes 0.01 0.00 - 0.07 K/uL    Comment: Performed at [facility], [address] [place], [place] [address]  Comprehensive metabolic panel     Status: Abnormal   Collection Time: 10/31/18  1:06 PM  Result Value Ref Range   Sodium 137 135 - 145 mmol/L   Potassium 3.7 3.5 - 5.1 mmol/L   Chloride 105 98 - 111 mmol/L   CO2 24 22 - 32 mmol/L   Glucose, Bld 96 70 - 99 mg/dL   BUN 8 8 - 23 mg/dL   Creatinine, Ser 1.26  (H) 0.61 - 1.24 mg/dL   Calcium 8.8 (  L) 8.9 - 10.3 mg/dL   Total Protein 7.4 6.5 - 8.1 g/dL   Albumin 3.2 (L) 3.5 - 5.0 g/dL   AST 15 15 - 41 U/L   ALT 11 0 - 44 U/L   Alkaline Phosphatase 44 38 - 126 U/L   Total Bilirubin 0.8 0.3 - 1.2 mg/dL   GFR calc non Af Amer 55 (L) >60 mL/min   GFR calc Af Amer >60 >60 mL/min   Anion gap 8 5 - 15    Comment: Performed at [facility] [address] [place], [place] [address]  TSH     Status: None   Collection Time: 10/31/18  1:06 PM  Result Value Ref Range   TSH 0.958 0.350 - 4.500 uIU/mL    Comment: Performed by a 3rd Generation assay with a functional sensitivity of <=0.01 uIU/mL. Performed at [facility], [place] [address], [place], [place] [address]    [name] And Wo Contrast  Result Date: 10/31/2018 CLINICAL DATA:  Progressive worsening vision. Vision has acutely worsened over the last week with associated diplopia. EXAM: MRI HEAD WITHOUT AND WITH CONTRAST TECHNIQUE: Multiplanar, multiecho pulse sequences of the brain and surrounding structures were obtained without and with intravenous contrast. CONTRAST:  9 mL Gadavist COMPARISON:  CT head without contrast 05/28/2018. MRI brain 06/24/2017 FINDINGS: Brain: Punctate diffusion signal abnormality is present in the posterior occipital lobes bilaterally on coronal image 45 of series 7 and axial image 80 of series 5. There is some T2 signal change within the occipital lobes bilaterally as well. This raises concern for acute/subacute ischemia. No other acute or focal infarct is present. Mild generalized atrophy and white matter disease is otherwise within normal limits for age. The ventricles are proportionate to the degree of atrophy. Basal ganglia are intact. The internal auditory canals are within normal limits. The brainstem and cerebellum are within normal limits. The postcontrast images demonstrate no pathologic enhancement. Vascular: Flow is present in the major intracranial  arteries. There is some signal irregularity within the superior sagittal sinus above the torcular. Skull and upper cervical spine: Craniocervical junction is normal. Mild degenerative changes are noted at C2-3 and C3-4. Upper cervical spine is within normal limits otherwise. Sinuses/Orbits: Minimal fluid is present in the left mastoid air cells. The paranasal sinuses and mastoid air cells are otherwise clear. Bilateral lens replacements are noted. Globes and orbits are otherwise unremarkable. IMPRESSION: 1. T2 signal change in some diffusion abnormality within the occipital lobes bilaterally concerning for acute/subacute punctate cortical infarcts of the occipital lobe. This corresponds with visual abnormalities. 2. Signal abnormality in the posterosuperior sagittal sinus may be artifactual. There does appear to be contrast within the superior sagittal sinus. MR venogram would be useful for further evaluation of sinus stenosis or thrombosis as an etiology of the occipital lobe disease. 3. Moderate atrophy and white matter disease is otherwise within normal limits for age. 4. Degenerative changes within the cervical spine. Electronically Signed   By: San Morelle M.D.   On: 10/31/2018 15:44    Pending Labs [employer] (From admission, onward)    Start     Ordered   Signed and Held  Hemoglobin A1c  Tomorrow morning,   R     Signed and Held   Signed and Held  Lipid panel  Tomorrow morning,   R    Comments:  Fasting    Signed and Held   Signed and Held  CBC  (enoxaparin (LOVENOX)  CrCl >/= 30 ml/min)  Once,   R    Comments:  Baseline for enoxaparin therapy IF NOT ALREADY DRAWN.  Notify MD if PLT < 100 K.    Signed and Held   Signed and Held  Creatinine, serum  (enoxaparin (LOVENOX)    CrCl >/= 30 ml/min)  Once,   R    Comments:  Baseline for enoxaparin therapy IF NOT ALREADY DRAWN.    Signed and Held   Signed and Held  Creatinine, serum  (enoxaparin (LOVENOX)    CrCl >/= 30 ml/min)   Weekly,   R    Comments:  while on enoxaparin therapy    Signed and Held   Signed and Held  Basic metabolic panel  Daily,   R     Signed and Held          Vitals/Pain Today's Vitals   10/31/18 1330 10/31/18 1335 10/31/18 1345 10/31/18 1435  BP: 123/67  128/69   Pulse: (!) 51     Resp: 16  16   Temp:      TempSrc:      SpO2: 100%     Weight:      Height:      PainSc:  0-No pain  0-No pain    Isolation Precautions No active isolations  Medications Medications  proparacaine (ALCAINE) 0.5 % ophthalmic solution 1 drop (1 drop Both Eyes Given 10/31/18 1247)  fluorescein ophthalmic strip 1 strip (1 strip Both Eyes Given 10/31/18 1420)  gadobutrol (GADAVIST) 1 MMOL/ML injection 9 mL (9 mLs Intravenous Contrast Given 10/31/18 1517)    Mobility walks Low fall risk   Focused Assessments    R Recommendations: See Admitting Provider Note  Report given to:   Additional Notes:

## 2018-10-31 NOTE — Progress Notes (Signed)
Patient admitted to 6 W 15 with the diagnosis of occipital stroke. A & O x 4. Denied any acute pain. Full assessment to epic completed. Bed alarm activated and the bed is in the lowest position. Will continue to monitor.

## 2018-10-31 NOTE — ED Triage Notes (Signed)
Pt present with 3-4 months of blurred vision. Pt reports that over the last 7-8 days the blurred vision has gotten worse.

## 2018-10-31 NOTE — ED Notes (Signed)
Patient transported to X-ray 

## 2018-10-31 NOTE — H&P (Signed)
History and Physical    [name] AJO:[id number] DOB: 02-08-1941 DOA: 10/31/2018  PCP: [name], MD  Patient coming from: Home  I have personally briefly reviewed patient's old medical records in [place]  Chief Complaint: Blurry vision  HPI: [name] is a 78 y.o. male with medical history significant of type 2 diabetes mellitus, hyperlipidemia, glaucoma, tobacco abuse who presents to [facility] ED with complaint of progressive blurry vision.  Patient reports first noticed worsening vision over the past 4 to 5 weeks with acute worsening over the past 1 week.  He reports vision is now white/blurry with associated peripheral vision loss to both eyes.  Patient also reports double vision.  Patient continues to smoke tobacco via pipe, otherwise denies any recent medication changes.  No other complaints at this time.  Patient denies headache, no eye pain, no nausea/vomiting/diarrhea, no chest pain, no palpitations, no fever/chills/night sweats, no shortness of breath,, no issues with bowel/bladder function, no paresthesias, no cough/congestion.  ED Course: Evaluation in the ED, patient is afebrile, with temperature 98.7, HR 51, RR 16, BP 128/69, SPO2 100% on room air.  WBC count 5.5, hemoglobin 11.0, sodium 137, potassium 3.7, BUN 8, creatinine 1.26, glucose 96.  TSH 0.958.  EKG notable for normal sinus rhythm with right bundle branch block, left atrial enlargement, rate 65, no concerning ST elevation/depression.  Patient underwent fluorescein staining by ED provider with no lacerations noted.  Tono-Pen with pressures on left 18, right 14; which are normal.  MR brain notable for acute versus subacute punctate cortical infarcts occipital lobe.  Case was discussed with neuro hospitalist who recommended medical admission for further stroke work-up.  Hospitalist service consulted for admission.  Review of Systems: As per HPI otherwise 10 point review of systems negative.     Past Medical History:  Diagnosis Date  . Arthritis   . Diabetic neuropathy ([place])   . High cholesterol   . Hypertension    denies  . Osteomyelitis (HCC)    Right great toe  . Peripheral vascular disease ([place])    diabetic  with osteomylitis  . Type II diabetes mellitus ([place])     Past Surgical History:  Procedure Laterality Date  . AMPUTATION  07/23/2011   Procedure: AMPUTATION DIGIT;  Surgeon: Newt Minion, MD;  Location: [place];  Service: Orthopedics;  Laterality: Right;  Right Great Toe Amputation  . AMPUTATION Right 09/18/2012   Procedure: Right Foot 2nd Ray Amputation;  Surgeon: Newt Minion, MD;  Location: [place];  Service: Orthopedics;  Laterality: Right;  Right Foot 2nd Ray Amputation  . AMPUTATION Right 10/14/2012   Procedure: AMPUTATION FOOT;  Surgeon: Newt Minion, MD;  Location: [place];  Service: Orthopedics;  Laterality: Right;  Right Foot Transmetatarsal Amputation  . AMPUTATION Left 09/23/2014   Procedure: Left Foot Transmetatarsal Amputation;  Surgeon: Newt Minion, MD;  Location: [place];  Service: Orthopedics;  Laterality: Left;  . BACK SURGERY     lower  . CARPAL TUNNEL RELEASE Right 01/30/2018   Procedure: RIGHT CARPAL TUNNEL RELEASE;  Surgeon: Newt Minion, MD;  Location: [place];  Service: Orthopedics;  Laterality: Right;  . CIRCUMCISION  2010  . COLONOSCOPY    . FOOT FRACTURE SURGERY Left 1970's   "broke it playing football" (10/12/2012)  . HUMERUS FRACTURE SURGERY W/ IMPLANT Right 1960's   "put a plate in it" (9/47/0962)  . INCISION AND DRAINAGE OF WOUND Left 2006   "foot" (10/12/2012)  . LUMBAR  [place] SURGERY  2008     reports that he has been smoking pipe. He has smoked for the past 12.00 years. He has never used smokeless tobacco. He reports that he does not drink alcohol or use drugs.  No Known Allergies  Family History  Problem Relation Age of Onset  . Diabetes Mellitus II Other   . Anesthesia problems Neg Hx     Prior to Admission medications    Medication Sig Start Date End Date Taking? Authorizing Provider  acetaminophen (TYLENOL) 500 MG tablet Take 1,000 mg by mouth every 6 (six) hours as needed for mild pain.    [provider]  ALPHAGAN P 0.1 % SOLN Place 1 drop into both eyes 2 (two) times daily.  12/31/16   [provider]  amoxicillin-clavulanate (AUGMENTIN) 875-125 MG tablet Take 1 tablet by mouth 2 (two) times daily. 10/27/18   Rayburn, Neta Mends, PA-C  aspirin EC 81 MG EC tablet Take 1 tablet (81 mg total) by mouth daily. 05/31/18   Bonnell Public, MD  atorvastatin (LIPITOR) 40 MG tablet Take 1 tablet (40 mg total) by mouth daily at 6 PM. 05/30/18   Bonnell Public, MD  latanoprost (XALATAN) 0.005 % ophthalmic solution Place 1 drop into both eyes at bedtime.  09/12/14   [provider]  metFORMIN (GLUCOPHAGE) 1000 MG tablet Take 1,000 mg by mouth 2 (two) times daily.     [provider]  pyridOXINE (VITAMIN B-6) 50 MG tablet Take 50 mg by mouth 2 (two) times daily.     [provider]  thiamine (VITAMIN B-1) 100 MG tablet Take 100 mg by mouth daily.    [provider]  timolol (TIMOPTIC) 0.5 % ophthalmic solution Place 1 drop into both eyes 2 (two) times daily. 05/19/17   [provider]  vitamin B-12 (CYANOCOBALAMIN) 500 MCG tablet Take 500 mcg by mouth daily.    [provider]    Physical Exam: Vitals:   10/31/18 1300 10/31/18 1315 10/31/18 1330 10/31/18 1345  BP: 119/67 121/67 123/67 128/69  Pulse: (!) 53 70 (!) 51   Resp: 14 13 16 16   Temp:      TempSrc:      SpO2: 100% 100% 100%   Weight:      Height:        Constitutional: NAD, calm, comfortable Vitals:   10/31/18 1300 10/31/18 1315 10/31/18 1330 10/31/18 1345  BP: 119/67 121/67 123/67 128/69  Pulse: (!) 53 70 (!) 51   Resp: 14 13 16 16   Temp:      TempSrc:      SpO2: 100% 100% 100%   Weight:      Height:       Eyes: PERRL, visual acuity bilaterally performed by EDP  20/40, scleral injection (although following fluorescein staining, Tono-Pen by EDP left 18, right 14. ENMT: Mucous membranes are moist. Posterior pharynx clear of any exudate or lesions.poor dentition Neck: normal, supple, no masses, no thyromegaly Respiratory: clear to auscultation bilaterally, no wheezing, no crackles. Normal respiratory effort. No accessory muscle use.  Oxygenating well on room air Cardiovascular: Regular rate and rhythm, no murmurs / rubs / gallops. No extremity edema. 2+ pedal pulses. No carotid bruits.  Abdomen: no tenderness, no masses palpated. No hepatosplenomegaly. Bowel sounds positive.  Musculoskeletal: no clubbing / cyanosis. No joint deformity upper and lower extremities. Good ROM, no contractures. Normal muscle tone.  Skin: Multiple areas of hyperpigmentation, no significant change per patient, otherwise no  rashes, lesions, ulcers. No induration Neurologic: CN 2-12 grossly intact. Sensation intact, DTR normal. Strength 5/5 in all 4.  Psychiatric: Normal judgment and insight. Alert and oriented x 3. Normal mood.    Labs on Admission: I have personally reviewed following labs and imaging studies  CBC: Recent Labs  Lab 10/31/18 1306  WBC 5.5  NEUTROABS 1.9  HGB 11.0*  HCT 35.3*  MCV 88.5  PLT 161   Basic Metabolic Panel: Recent Labs  Lab 10/31/18 1306  NA 137  K 3.7  CL 105  CO2 24  GLUCOSE 96  BUN 8  CREATININE 1.26*  CALCIUM 8.8*   GFR: Estimated Creatinine Clearance: 57.2 mL/min (A) (by C-G formula based on SCr of 1.26 mg/dL (H)). Liver Function Tests: Recent Labs  Lab 10/31/18 1306  AST 15  ALT 11  ALKPHOS 44  BILITOT 0.8  PROT 7.4  ALBUMIN 3.2*   No results for input(s): LIPASE, AMYLASE in the last 168 hours. No results for input(s): AMMONIA in the last 168 hours. Coagulation Profile: No results for input(s): INR, PROTIME in the last 168 hours. Cardiac Enzymes: No results for input(s): CKTOTAL, CKMB, CKMBINDEX, TROPONINI in the  last 168 hours. BNP (last 3 results) No results for input(s): PROBNP in the last 8760 hours. HbA1C: No results for input(s): HGBA1C in the last 72 hours. CBG: No results for input(s): GLUCAP in the last 168 hours. Lipid Profile: No results for input(s): CHOL, HDL, LDLCALC, TRIG, CHOLHDL, LDLDIRECT in the last 72 hours. Thyroid Function Tests: Recent Labs    10/31/18 1306  TSH 0.958   Anemia Panel: No results for input(s): VITAMINB12, FOLATE, FERRITIN, TIBC, IRON, RETICCTPCT in the last 72 hours. Urine analysis:    Component Value Date/Time   COLORURINE YELLOW 05/28/2018 [place] 05/28/2018 1445   LABSPEC 1.016 05/28/2018 1445   PHURINE 5.0 05/28/2018 1445   GLUCOSEU NEGATIVE 05/28/2018 1445   HGBUR SMALL (A) 05/28/2018 1445   HGBUR trace-intact 09/16/2007 1115   BILIRUBINUR NEGATIVE 05/28/2018 1445   KETONESUR NEGATIVE 05/28/2018 1445   PROTEINUR 30 (A) 05/28/2018 1445   UROBILINOGEN 1.0 10/24/2013 1220   NITRITE NEGATIVE 05/28/2018 1445   LEUKOCYTESUR NEGATIVE 05/28/2018 1445    Radiological Exams on Admission: Mr Brain W And Wo Contrast  Result Date: 10/31/2018 CLINICAL DATA:  Progressive worsening vision. Vision has acutely worsened over the last week with associated diplopia. EXAM: MRI HEAD WITHOUT AND WITH CONTRAST TECHNIQUE: Multiplanar, multiecho pulse sequences of the brain and surrounding structures were obtained without and with intravenous contrast. CONTRAST:  9 mL Gadavist COMPARISON:  CT head without contrast 05/28/2018. MRI brain 06/24/2017 FINDINGS: Brain: Punctate diffusion signal abnormality is present in the posterior occipital lobes bilaterally on coronal image 45 of series 7 and axial image 80 of series 5. There is some T2 signal change within the occipital lobes bilaterally as well. This raises concern for acute/subacute ischemia. No other acute or focal infarct is present. Mild generalized atrophy and white matter disease is otherwise  within normal limits for age. The ventricles are proportionate to the degree of atrophy. Basal ganglia are intact. The internal auditory canals are within normal limits. The brainstem and cerebellum are within normal limits. The postcontrast images demonstrate no pathologic enhancement. Vascular: Flow is present in the major intracranial arteries. There is some signal irregularity within the superior sagittal sinus above the torcular. Skull and upper cervical spine: Craniocervical junction is normal. Mild degenerative changes are noted at C2-3 and C3-4. Upper  cervical spine is within normal limits otherwise. Sinuses/Orbits: Minimal fluid is present in the left mastoid air cells. The paranasal sinuses and mastoid air cells are otherwise clear. Bilateral lens replacements are noted. Globes and orbits are otherwise unremarkable. IMPRESSION: 1. T2 signal change in some diffusion abnormality within the occipital lobes bilaterally concerning for acute/subacute punctate cortical infarcts of the occipital lobe. This corresponds with visual abnormalities. 2. Signal abnormality in the posterosuperior sagittal sinus may be artifactual. There does appear to be contrast within the superior sagittal sinus. MR venogram would be useful for further evaluation of sinus stenosis or thrombosis as an etiology of the occipital lobe disease. 3. Moderate atrophy and white matter disease is otherwise within normal limits for age. 4. Degenerative changes within the cervical spine. Electronically Signed   By: San Morelle M.D.   On: 10/31/2018 15:44    EKG: Independently reviewed.  Normal sinus rhythm, rate 65, LAE, R BBB, no ST elevation/depression or concerning T wave inversions  Assessment/Plan Principal Problem:   Occipital stroke (HCC) Active Problems:   Tobacco abuse   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HCC)   Hyperlipemia, mixed   DM2 (diabetes mellitus, type 2) ([place])  Occipital CVA Patient presenting with  4-5 weeks of progressive visual disturbance with rapid progression over the past week associated with peripheral vision loss and diplopia.  Patient with history of glaucoma, although Tono-Pen pressures in ED within normal limits.  MR brain notable for acute versus subacute punctate cortical infarcts within the occipital lobe.  No history of atrial fibrillation, but continues with tobacco abuse and history of type 2 diabetes mellitus and hyperlipidemia. --Admit to observation status, med telemetry, further CVA work-up per neurology --N.p.o. pending swallow evaluation; if passes can start diabetic/heart healthy diet --Lipid panel, hemoglobin A1c --TTE --Carotid Doppler/CT head/neck with contrast --PT/OT/speech therapy evaluation --Counseled on need for tobacco cessation --Supportive care  History of glaucoma Tono-Pen pressures within normal limits. --Continue home eyedrops  Type 2 diabetes mellitus Home regimen includes metformin 1000 mg p.o. twice daily.  Last hemoglobin A1c 5.2 on 05/29/2018, well controlled.  Glucose on admission 96. --Update hemoglobin A1c --Insulin sliding scale for coverage if needed --Consistent carbohydrate diet when passes swallow evaluation  CKD Stage IIIA Creatinine on admission 1.26.  Creatinine clearance of 57.4 review of EMR notable for creatinine 1.2-1.57 over the past year.  Appears to be at baseline --Avoid nephrotoxins, renally dose all medications --Repeat BMP in the a.m.  Hyperlipidemia --Update lipid panel --Continue home atorvastatin 40 mg daily  Tobacco abuse disorder Patient reports continues to smoke tobacco with a pipe 1-3 times per day.  Discussed with him the need for cessation given stroke findings.  DVT prophylaxis: Lovenox Code Status: DNR Family Communication: Discussed plan of care with patient's niece [name] over telephone today [phone]) Disposition Plan: Admit to observation, hopeful discharge home in 1-2 days dependent  on further work-up and therapy evaluation Consults called: EDP consulted neurology Admission status: Observation status   Yoselyn Mcglade J [place] DO [facility] Pager [phone]  If 7PM-7AM, please contact night-coverage [email or web address] Password [email or web address]  10/31/2018, 5:20 PM

## 2018-10-31 NOTE — ED Notes (Signed)
[place] for COVID test orders before pt transport.

## 2018-10-31 NOTE — ED Notes (Signed)
Patient transported to MRI 

## 2018-10-31 NOTE — Consult Note (Addendum)
Referring Physician: ER   Reason for Consult: neurologic opinion for Subacute stroke CC: "I just can't see stuff anymore around my house"  HPI: Emelio Schneller is an 78 y.o. male with PMH of dm2, with complications and amputations, HTN, HLD, PVD, arthritis. Mr hoogland states that he has had progressing poor blurry vision for ~50mo. He sought care at his PCP, but pt tells me they told him it was just his blood sugar. This has since progressed and he is having greater difficulty navigating in his home and has been bumping into things. Denies falls; but he is covered with bruises. MRI shows punctate bilat occipital strokes.    Past Medical History Past Medical History:  Diagnosis Date  . Arthritis   . Diabetic neuropathy ([place])   . High cholesterol   . Hypertension    denies  . Osteomyelitis ([facility])    Right great toe  . Peripheral vascular disease ([place])    diabetic  with osteomylitis  . Type II diabetes mellitus [facility])     Surgical History Past Surgical History:  Procedure Laterality Date  . AMPUTATION  07/23/2011   Procedure: AMPUTATION DIGIT;  Surgeon: Newt Minion, MD;  Location: [facility];  Service: Orthopedics;  Laterality: Right;  Right Great Toe Amputation  . AMPUTATION Right 09/18/2012   Procedure: Right Foot 2nd Ray Amputation;  Surgeon: Newt Minion, MD;  Location: [facility];  Service: Orthopedics;  Laterality: Right;  Right Foot 2nd Ray Amputation  . AMPUTATION Right 10/14/2012   Procedure: AMPUTATION FOOT;  Surgeon: Newt Minion, MD;  Location: [facility];  Service: Orthopedics;  Laterality: Right;  Right Foot Transmetatarsal Amputation  . AMPUTATION Left 09/23/2014   Procedure: Left Foot Transmetatarsal Amputation;  Surgeon: Newt Minion, MD;  Location: [facility];  Service: Orthopedics;  Laterality: Left;  . BACK SURGERY     lower  . CARPAL TUNNEL RELEASE Right 01/30/2018   Procedure: RIGHT CARPAL TUNNEL RELEASE;  Surgeon: Newt Minion, MD;  Location: [facility];  Service: Orthopedics;  Laterality:  Right;  . CIRCUMCISION  2010  . COLONOSCOPY    . FOOT FRACTURE SURGERY Left 1970's   "broke it playing football" (10/12/2012)  . HUMERUS FRACTURE SURGERY W/ IMPLANT Right 1960's   "put a plate in it" (9/62/8366)  . INCISION AND DRAINAGE OF WOUND Left 2006   "foot" (10/12/2012)  . [place] SURGERY  2008    Family History  Family History  Problem Relation Age of Onset  . Diabetes Mellitus II Other   . Anesthesia problems Neg Hx     Social History:   reports that he has been smoking pipe. He has smoked for the past 12.00 years. He has never used smokeless tobacco. He reports that he does not drink alcohol or use drugs.  Allergies:  No Known Allergies  Home Medications:  (Not in a hospital admission)   Hospital Medications   ROS:  History obtained from pt  General ROS: negative for - chills, fatigue, fever, night sweats, weight gain or weight loss Psychological ROS: negative for - behavioral disorder, hallucinations, memory difficulties, mood swings or suicidal ideation Ophthalmic ROS: posative blurry vision, double vision, loss of vision. No pain ENT ROS: negative for - epistaxis, nasal discharge, oral lesions, sore throat, tinnitus or vertigo Allergy and Immunology ROS: positive for - hives or itchy/watery eyes Hematological and Lymphatic ROS: negative for - bleeding problems, or swollen lymph nodes. Notes bruising Endocrine ROS: negative for - polydipsia/polyuria or temperature intolerance;  DM2  Respiratory ROS: negative for - cough, hemoptysis, shortness of breath or wheezing Cardiovascular ROS: negative for - chest pain, dyspnea on exertion, edema or irregular heartbeat Gastrointestinal ROS: negative for - abdominal pain, diarrhea, hematemesis, nausea/vomiting or stool incontinence Genito-Urinary ROS: negative for - dysuria, hematuria, incontinence or urinary frequency/urgency Musculoskeletal ROS: negative for - joint swelling or muscular weakness; bilat foot  amputations Neurological ROS: as noted in HPI Dermatological ROS: negative for rash and skin lesion changes; multiple stages of bruising   Physical Examination:  Vitals:   10/31/18 1300 10/31/18 1315 10/31/18 1330 10/31/18 1345  BP: 119/67 121/67 123/67 128/69  Pulse: (!) 53 70 (!) 51   Resp: 14 13 16 16   Temp:      TempSrc:      SpO2: 100% 100% 100%   Weight:      Height:        General - frail; mild distress Heart - Regular rate and rhythm - no murmer appreciated Lungs - Clear to auscultation  Abdomen - Soft - non tender Extremities - Distal pulses intact - no edema Skin - Warm and dry; bruising in multiple stages   Neurologic Examination:  Mental Status: Alert, oriented, thought content appropriate.  Speech fluent without evidence of aphasia. Able to follow 3 step commands without difficulty. Cranial Nerves: Gaze conjugate. There is arcus senilis present. PERRL, EOMI, No hemianopia, but gross exam of VF: misses multiple fingers in all fields, but can grossly see when I raise hand on both sides. decreased peripheral view in all directions.  Smile symmetric, facial light touch sensation normal bilaterally. Somewhat HOH. bilateral shoulder shrug intact. midline tongue extension Motor: RUE - 5/5    LUE - 5/5   RLE - 5/5    LLE - 5/5 Tone and bulk:normal tone throughout; no atrophy noted Sensory: Light touch intact throughout, bilaterally; some deficit in bilat LE d/t chronic PVD/Dm neuropathy Deep Tendon Reflexes: 2+ and symmetric throughout Plantars: amputation; unable Cerebellar: Ataxic bilat normal finger-to-nose, slightly worse with right; normal heel-to-shin test Gait: deferred at this time.   NIHSS 1a Level of Conscious:0 1b LOC Questions: 0 1c LOC Commands: 0 2 Best Gaze: 0 3 Visual: 0 4 Facial Palsy: 0 5a Motor Arm - left: 0 5b Motor Arm - Right: 0 6a Motor Leg - Left: 0 6b Motor Leg - Right: 0 7 Limb Ataxia: 1 8 Sensory: 0 9 Best Language: 0 10  Dysarthria:0 11 Extinct. and Inattention:0 TOTAL: 1  LABORATORY STUDIES:  Basic Metabolic Panel: Recent Labs  Lab 10/31/18 1306  NA 137  K 3.7  CL 105  CO2 24  GLUCOSE 96  BUN 8  CREATININE 1.26*  CALCIUM 8.8*    Liver Function Tests: Recent Labs  Lab 10/31/18 1306  AST 15  ALT 11  ALKPHOS 44  BILITOT 0.8  PROT 7.4  ALBUMIN 3.2*   No results for input(s): LIPASE, AMYLASE in the last 168 hours. No results for input(s): AMMONIA in the last 168 hours.  CBC: Recent Labs  Lab 10/31/18 1306  WBC 5.5  NEUTROABS 1.9  HGB 11.0*  HCT 35.3*  MCV 88.5  PLT 230    Cardiac Enzymes: No results for input(s): CKTOTAL, CKMB, CKMBINDEX, TROPONINI in the last 168 hours.  BNP: Invalid input(s): POCBNP  CBG: No results for input(s): GLUCAP in the last 168 hours.  Microbiology:   Coagulation Studies: No results for input(s): LABPROT, INR in the last 72 hours.  Urinalysis: No results for input(s): COLORURINE,  LABSPEC, PHURINE, GLUCOSEU, HGBUR, BILIRUBINUR, KETONESUR, PROTEINUR, UROBILINOGEN, NITRITE, LEUKOCYTESUR in the last 168 hours.  Invalid input(s): APPERANCEUR  Lipid Panel:     Component Value Date/Time   CHOL 163 10/13/2012 0645   TRIG 124 10/13/2012 0645   HDL 20 (L) 10/13/2012 0645   CHOLHDL 8.2 10/13/2012 0645   VLDL 25 10/13/2012 0645   LDLCALC 118 (H) 10/13/2012 0645    HgbA1C:  Lab Results  Component Value Date   HGBA1C 5.2 05/29/2018    Urine Drug Screen:      Component Value Date/Time   LABOPIA NONE DETECTED 06/24/2017 1802   COCAINSCRNUR NONE DETECTED 06/24/2017 1802   LABBENZ NONE DETECTED 06/24/2017 1802   AMPHETMU NONE DETECTED 06/24/2017 1802   THCU NONE DETECTED 06/24/2017 1802   LABBARB NONE DETECTED 06/24/2017 1802     Alcohol Level:  No results for input(s): ETH in the last 168 hours.  Miscellaneous labs:  EKG  EKG   IMAGING: [name] And Wo Contrast  Result Date: 10/31/2018 CLINICAL DATA:  Progressive  worsening vision. Vision has acutely worsened over the last week with associated diplopia. EXAM: MRI HEAD WITHOUT AND WITH CONTRAST TECHNIQUE: Multiplanar, multiecho pulse sequences of the brain and surrounding structures were obtained without and with intravenous contrast. CONTRAST:  9 mL Gadavist COMPARISON:  CT head without contrast 05/28/2018. MRI brain 06/24/2017 FINDINGS: Brain: Punctate diffusion signal abnormality is present in the posterior occipital lobes bilaterally on coronal image 45 of series 7 and axial image 80 of series 5. There is some T2 signal change within the occipital lobes bilaterally as well. This raises concern for acute/subacute ischemia. No other acute or focal infarct is present. Mild generalized atrophy and white matter disease is otherwise within normal limits for age. The ventricles are proportionate to the degree of atrophy. Basal ganglia are intact. The internal auditory canals are within normal limits. The brainstem and cerebellum are within normal limits. The postcontrast images demonstrate no pathologic enhancement. Vascular: Flow is present in the major intracranial arteries. There is some signal irregularity within the superior sagittal sinus above the torcular. Skull and upper cervical spine: Craniocervical junction is normal. Mild degenerative changes are noted at C2-3 and C3-4. Upper cervical spine is within normal limits otherwise. Sinuses/Orbits: Minimal fluid is present in the left mastoid air cells. The paranasal sinuses and mastoid air cells are otherwise clear. Bilateral lens replacements are noted. Globes and orbits are otherwise unremarkable. IMPRESSION: 1. T2 signal change in some diffusion abnormality within the occipital lobes bilaterally concerning for acute/subacute punctate cortical infarcts of the occipital lobe. This corresponds with visual abnormalities. 2. Signal abnormality in the posterosuperior sagittal sinus may be artifactual. There does appear to be  contrast within the superior sagittal sinus. MR venogram would be useful for further evaluation of sinus stenosis or thrombosis as an etiology of the occipital lobe disease. 3. Moderate atrophy and white matter disease is otherwise within normal limits for age. 4. Degenerative changes within the cervical spine. Electronically Signed   By: San Morelle M.D.   On: 10/31/2018 15:44     Transthoracic Echocardiogram - pending   Bilateral Carotid Dopplers - pending   Assessment: 78 y.o. male with PMH of dm2, with complications and amputations, HTN, HLD, PVD, arthritis. Mr petrasek states that he has had progressing poor blurry vision for ~52mo. He sought care at his PCP, but pt tells me they told him it was just his blood sugar. This has since progressed and he is having  greater difficulty navigating in his home. MRI shows punctate bilat occipital strokes.   Stroke  Resultant blurred vision  MRI head - bilat punctate occipital strokes  CTA H&N - pending  2D Echo - pending  Sars Corona Virus 2 pending  LDL - pending  HgbA1c - pending  UDS - pending  VTE prophylaxis - pending  Diet to be cleared  ASA 81mg  prior to admission  Patient counseled to be compliant with his antithrombotic medications  Ongoing aggressive stroke risk factor management  Therapy recommendations:  pending  Disposition:  Pending  Hypertension  Stable . No role in Permissive hypertension as this is subacute .  Long-term BP goal normotensive  Hyperlipidemia  Lipid lowering medication PTA:  40mg  Lipitor daily  LDL pending, goal < 70  Current lipid lowering medication:40mg  Lipitor daily  Continue statin at discharge  Diabetes  HgbA1c pending, goal < 7.0  Uncontrolled with complications of peripheral neuropathy and prev amputations of bilat feet. Hyperglycemia  Other Stroke Risk Factors  Advanced age  Cigarette smoker, advised to stop smoking  Coronary artery disease  Other Active  Problems  General debility   Plan:  HgbA1c, fasting lipid panel  CT Angiogram Head and Neck  PT consult, OT consult, Speech consult  Echocardiogram  Prophylactic therapy - Antiplatelet medication  Statin Therapy - LDL goal < 70  Risk factor modification  Telemetry monitoring  Frequent neuro checks  Fall Precautions  DVT prophelaxis  NPO until swallowing evaluation has been passed (aspirin suppository if needed)     Desiree Metzger-Cihelka, ARNP-C, ANVP-BC Attending Neurologist's note to follow  I have seen the patient and reviewed the above note.  He has some changes in his occipital lobe that are most consistent with a subacute infarct which is in keeping with the time that he is describing worsening of his vision.  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    He describes stepdown sequence of worsening as opposed to a gradual progression.  The most recent stepdown was 3 weeks ago.  Stroke work-up as described above.  Rafan Rack, MD [facility] [phone]  If 7pm- 7am, please page neurology on call as listed in [place].

## 2018-10-31 NOTE — ED Provider Notes (Signed)
[facility] EMERGENCY DEPARTMENT Provider Note   CSN: [id number] Arrival date & time: 10/31/18  1151    History   Chief Complaint Chief Complaint  Patient presents with  . Diplopia    HPI [name] is a 77 y.o. male.     The history is provided by medical records and the patient. No language interpreter was used.  Eye Problem  Location:  Both eyes Quality:  Unable to specify Severity:  Severe Onset quality:  Gradual Duration:  1 week (4 months worsenin this week) Timing:  Constant Progression:  Worsening Chronicity:  New Context: not direct trauma, not foreign body and not scratch   Relieved by:  Nothing Worsened by:  Nothing Associated symptoms: blurred vision, decreased vision, double vision and headaches   Associated symptoms: no inflammation, no nausea, no numbness, no photophobia, no vomiting and no weakness     Past Medical History:  Diagnosis Date  . Arthritis   . Diabetic neuropathy ([place])   . High cholesterol   . Hypertension    denies  . Osteomyelitis (HCC)    Right great toe  . Peripheral vascular disease ([place])    diabetic  with osteomylitis  . Type II diabetes mellitus [facility])     Patient Active Problem List   Diagnosis Date Noted  . NSTEMI (non-ST elevated myocardial infarction) ([place]) 05/28/2018  . Syncope 05/28/2018  . HTN (hypertension) 05/28/2018  . Hyperlipemia, mixed 05/28/2018  . Rash and nonspecific skin eruption 12/16/2017  . Pain in right hand 09/11/2017  . Carpal tunnel syndrome, right upper limb 07/01/2017  . Arthritis of carpometacarpal [facility]) joint of left thumb 12/30/2016  . Midfoot skin ulcer, right, limited to breakdown of skin ([place]) 07/22/2016  . 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 Osteomyelitis of ankle or foot, left, acute ([place]) 08/23/2014  . Cellulitis 10/12/2012  . Chest pain 10/12/2012  . Tobacco abuse 10/12/2012  . ABSCESS, AXILLA, LEFT 02/23/2008  . 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 CHEST WALL PAIN, ACUTE 12/03/2007  . CANDIDIASIS, GLANS PENIS 09/16/2007  . HEADACHE 08/04/2007  . 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 Other and unspecified hyperlipidemia 03/05/2007  . GOUT 03/05/2007  . ERECTILE DYSFUNCTION 03/05/2007  . EXTERNAL HEMORRHOIDS 03/05/2007  . VENTRAL HERNIA 03/05/2007  . FATTY LIVER DISEASE 03/05/2007  . Osteoarthritis 03/05/2007  . HEMORRHOIDS, INTERNAL 03/17/2005  . COLONIC POLYPS, HX OF 03/17/2005    Past Surgical History:  Procedure Laterality Date  . AMPUTATION  07/23/2011   Procedure: AMPUTATION DIGIT;  Surgeon: Newt Minion, MD;  Location: [place];  Service: Orthopedics;  Laterality: Right;  Right Great Toe Amputation  . AMPUTATION Right 09/18/2012   Procedure: Right Foot 2nd Ray Amputation;  Surgeon: Newt Minion, MD;  Location: [place];  Service: Orthopedics;  Laterality: Right;  Right Foot 2nd Ray Amputation  . AMPUTATION Right 10/14/2012   Procedure: AMPUTATION FOOT;  Surgeon: Newt Minion, MD;  Location: [place];  Service: Orthopedics;  Laterality: Right;  Right Foot Transmetatarsal Amputation  . AMPUTATION Left 09/23/2014   Procedure: Left Foot Transmetatarsal Amputation;  Surgeon: Newt Minion, MD;  Location: [place];  Service: Orthopedics;  Laterality: Left;  . BACK SURGERY     lower  . CARPAL TUNNEL RELEASE Right 01/30/2018   Procedure: RIGHT CARPAL TUNNEL RELEASE;  Surgeon: Newt Minion, MD;  Location: [place];  Service: Orthopedics;  Laterality:  Right;  Marland Kitchen CIRCUMCISION  2010  . COLONOSCOPY    . FOOT FRACTURE SURGERY Left 1970's   "broke it playing football" (10/12/2012)  . HUMERUS FRACTURE SURGERY W/ IMPLANT Right 1960's   "put a plate in it" (2/42/3536)  . INCISION AND DRAINAGE OF WOUND Left 2006   "foot" (10/12/2012)  . [place] SURGERY  2008        Home Medications    Prior to Admission medications   Medication Sig Start Date End  Date Taking? Authorizing Provider  acetaminophen (TYLENOL) 500 MG tablet Take 1,000 mg by mouth every 6 (six) hours as needed for mild pain.    [provider]  ALPHAGAN P 0.1 % SOLN Place 1 drop into both eyes 2 (two) times daily.  12/31/16   [provider]  amoxicillin-clavulanate (AUGMENTIN) 875-125 MG tablet Take 1 tablet by mouth 2 (two) times daily. 10/27/18   Rayburn, Neta Mends, PA-C  aspirin EC 81 MG EC tablet Take 1 tablet (81 mg total) by mouth daily. 05/31/18   Bonnell Public, MD  atorvastatin (LIPITOR) 40 MG tablet Take 1 tablet (40 mg total) by mouth daily at 6 PM. 05/30/18   Bonnell Public, MD  latanoprost (XALATAN) 0.005 % ophthalmic solution Place 1 drop into both eyes at bedtime.  09/12/14   [provider]  metFORMIN (GLUCOPHAGE) 1000 MG tablet Take 1,000 mg by mouth 2 (two) times daily.     [provider]  pyridOXINE (VITAMIN B-6) 50 MG tablet Take 50 mg by mouth 2 (two) times daily.     [provider]  thiamine (VITAMIN B-1) 100 MG tablet Take 100 mg by mouth daily.    [provider]  timolol (TIMOPTIC) 0.5 % ophthalmic solution Place 1 drop into both eyes 2 (two) times daily. 05/19/17   [provider]  vitamin B-12 (CYANOCOBALAMIN) 500 MCG tablet Take 500 mcg by mouth daily.    [provider]    Family History Family History  Problem Relation Age of Onset  . Diabetes Mellitus II Other   . Anesthesia problems Neg Hx     Social History Social History   Tobacco Use  . Smoking status: Current Every Day Smoker    Years: 12.00    Types: Pipe  . Smokeless tobacco: Never Used  Substance Use Topics  . Alcohol use: No    Alcohol/week: 0.0 standard drinks  . Drug use: No     Allergies   Patient has no known allergies.   Review of Systems Review of Systems  Constitutional: Negative for chills, diaphoresis, fatigue and fever.  HENT: Negative for congestion.   Eyes: Positive  for blurred vision, double vision, pain (mild) and visual disturbance. Negative for photophobia.  Respiratory: Negative for cough, chest tightness, shortness of breath and wheezing.   Gastrointestinal: Negative for abdominal pain, nausea and vomiting.  Genitourinary: Negative for dysuria, flank pain and frequency.  Musculoskeletal: Negative for back pain, neck pain and neck stiffness.  Skin: Negative for rash and wound.  Neurological: Positive for headaches. Negative for weakness, light-headedness and numbness.  Psychiatric/Behavioral: Negative for agitation and confusion.  All other systems reviewed and are negative.    Physical Exam Updated Vital Signs There were no vitals taken for this visit.  Physical Exam Vitals signs and nursing note reviewed.  Constitutional:      General: He is not in acute distress.    Appearance: He is well-developed. He is not ill-appearing, toxic-appearing or diaphoretic.  HENT:     Head: Normocephalic and atraumatic.     Nose: No congestion or rhinorrhea.     Mouth/Throat:     Pharynx: No oropharyngeal exudate or posterior oropharyngeal erythema.  Eyes:     General:        Right eye: No discharge.        Left eye: No discharge.     Extraocular Movements: Extraocular movements intact.     Conjunctiva/sclera: Conjunctivae normal.     Pupils: Pupils are equal, round, and reactive to light.  Neck:     Musculoskeletal: Neck supple. No muscular tenderness.  Cardiovascular:     Rate and Rhythm: Normal rate and regular rhythm.     Heart sounds: No murmur.  Pulmonary:     Effort: Pulmonary effort is normal. No respiratory distress.     Breath sounds: Normal breath sounds. No wheezing, rhonchi or rales.  Chest:     Chest wall: No tenderness.  Abdominal:     Palpations: Abdomen is soft.     Tenderness: There is no abdominal tenderness.  Musculoskeletal:        General: No tenderness.     Right lower leg: No edema.     Left lower leg: No edema.   Skin:    General: Skin is warm and dry.  Neurological:     Mental Status: He is alert and oriented to person, place, and time.     Sensory: No sensory deficit.     Motor: No weakness.     Coordination: Coordination normal.  Psychiatric:        Mood and Affect: Mood normal.      ED Treatments / Results  Labs (all labs ordered are listed, but only abnormal results are displayed) Labs Reviewed  CBC WITH DIFFERENTIAL/PLATELET - Abnormal; Notable for the following components:      Result Value   RBC 3.99 (*)    Hemoglobin 11.0 (*)    HCT 35.3 (*)    RDW 15.8 (*)    Eosinophils Absolute 1.4 (*)    All other components within normal limits  COMPREHENSIVE METABOLIC PANEL - Abnormal; Notable for the following components:   Creatinine, Ser 1.26 (*)    Calcium 8.8 (*)    Albumin 3.2 (*)    GFR calc non Af Amer 55 (*)    All other components within normal limits  TSH    EKG EKG Interpretation  Date/Time:  Saturday Oct 31 2018 12:08:00 EDT Ventricular Rate:  65 PR Interval:    QRS Duration: 131 QT Interval:  457 QTC Calculation: 476 R Axis:   -78 Text Interpretation:  Sinus rhythm Consider left atrial enlargement RBBB and LAFB When compared to prior, no significant changes seen.  No STEMI Confirmed by Antony Blackbird [phone]) on 10/31/2018 12:36:03 PM Also confirmed by Antony Blackbird [phone]), editor Philomena Doheny [phone])  on 10/31/2018 2:35:31 PM   Radiology No results found.  Procedures Procedures (including critical care time)  Medications Ordered in ED Medications  proparacaine (ALCAINE) 0.5 % ophthalmic solution 1 drop (1 drop Both Eyes Given 10/31/18 1247)  fluorescein ophthalmic strip 1 strip (1 strip Both Eyes Given 10/31/18 1420)  gadobutrol (GADAVIST) 1 MMOL/ML injection 9 mL (9 mLs Intravenous Contrast Given 10/31/18 1517)     Initial Impression / Assessment and Plan / ED Course  I have reviewed the triage vital signs and the nursing notes.  Pertinent labs &  imaging results that were available during my care of  the patient were reviewed by me and considered in my medical decision making (see chart for details).        Dewayne Severe is a 78 y.o. male with a past medical history significant for hypertension, diabetes, hypercholesterolemia, diabetic neuropathy, peripheral vascular disease who presents with vision changes.  Patient reports that over the last several months he has had worsening blurry vision bilaterally.  He thinks he may have had glaucoma years ago but has not been on medicine for it.  He reports that over the last week he has developed acutely worsened bilateral blurry vision and diplopia.  He denies any nausea, vomiting, urinary symptoms or GI symptoms.  He has no chest pain, palpitations or shortness of breath.  He denies any neck pain or neck stiffness.  He does report mild headache behind his eyes.  He reports his eyes may hurt slightly but denies anything getting in the morning.  He denies any numbness, tingling, weakness of extremities.  On exam, patient has 20/40 vision bilaterally.  Patient's peripheral vision was unable to see fingers.  Normal extraocular movements.  Pupils are symmetric.  Difficult to assess retina with endoscopy.  No hemorrhage seen.  Symmetric smile.  Normal sensation strength in extremities.  Lungs clear chest nontender.  Abdomen nontender.  Patient had a floor seen eye exam with no evidence of lacerations.  Pressures were checked with Tono-Pen and were 18 on the left and 14 on the right.  No evidence of glaucoma.  Due to the patient's worsening vision over the last several months with acute worsening this week with canal diplopia and peripheral vision loss, patient will need MRI with and without contrast to look for both stroke or mass or other abnormality.   Anticipate reassessment after imaging.  Care transferred to oncoming team while awaiting results of MRI brain.  Anticipate reassessment after imaging to  determine disposition.  If no mass or stroke is discovered, anticipate outpatient neurology or ophthalmology follow-up.   Final Clinical Impressions(s) / ED Diagnoses   Final diagnoses:  Diplopia  Blurry vision, bilateral     Clinical Impression: 1. Diplopia   2. Blurry vision, bilateral     Disposition: Care transferred to oncoming team while awaiting results of MRI.  This note was prepared with assistance of Systems analyst. Occasional wrong-word or sound-a-like substitutions may have occurred due to the inherent limitations of voice recognition software.      Tegeler, Gwenyth Allegra, MD 10/31/18 [phone]

## 2018-11-01 ENCOUNTER — Observation Stay (HOSPITAL_BASED_OUTPATIENT_CLINIC_OR_DEPARTMENT_OTHER): Payer: Medicare Other

## 2018-11-01 ENCOUNTER — Observation Stay (HOSPITAL_COMMUNITY): Payer: Medicare Other

## 2018-11-01 ENCOUNTER — Encounter (HOSPITAL_COMMUNITY): Payer: Medicare Other

## 2018-11-01 DIAGNOSIS — I639 Cerebral infarction, unspecified: Secondary | ICD-10-CM

## 2018-11-01 DIAGNOSIS — E1159 Type 2 diabetes mellitus with other circulatory complications: Secondary | ICD-10-CM | POA: Diagnosis not present

## 2018-11-01 DIAGNOSIS — I371 Nonrheumatic pulmonary valve insufficiency: Secondary | ICD-10-CM

## 2018-11-01 LAB — HEMOGLOBIN A1C
Hgb A1c MFr Bld: 5.4 % (ref 4.8–5.6)
Mean Plasma Glucose: 108.28 mg/dL

## 2018-11-01 LAB — LIPID PANEL
Cholesterol: 136 mg/dL (ref 0–200)
HDL: 42 mg/dL (ref >40)
LDL Cholesterol: 86 mg/dL (ref 0–99)
Total CHOL/HDL Ratio: 3.2 RATIO
Triglycerides: 42 mg/dL (ref <150)
VLDL: 8 mg/dL (ref 0–40)

## 2018-11-01 LAB — ECHOCARDIOGRAM COMPLETE
Height: 73 in
Weight: 2836 oz

## 2018-11-01 LAB — BASIC METABOLIC PANEL
Anion gap: 7 (ref 5–15)
BUN: 11 mg/dL (ref 8–23)
CO2: 23 mmol/L (ref 22–32)
Calcium: 8.5 mg/dL — ABNORMAL LOW (ref 8.9–10.3)
Chloride: 106 mmol/L (ref 98–111)
Creatinine, Ser: 1.09 mg/dL (ref 0.61–1.24)
GFR calc Af Amer: >60 mL/min (ref >60)
GFR calc non Af Amer: >60 mL/min (ref >60)
Glucose, Bld: 95 mg/dL (ref 70–99)
Potassium: 3.7 mmol/L (ref 3.5–5.1)
Sodium: 136 mmol/L (ref 135–145)

## 2018-11-01 LAB — GLUCOSE, CAPILLARY
Glucose-Capillary: 90 mg/dL (ref 70–99)
Glucose-Capillary: 102 mg/dL — ABNORMAL HIGH (ref 70–99)

## 2018-11-01 MED ORDER — CLOPIDOGREL BISULFATE 75 MG PO TABS: 75.0000 mg | ORAL_TABLET | Freq: Every day | ORAL | 0 refills | Status: DC

## 2018-11-01 MED ORDER — CLOPIDOGREL BISULFATE 75 MG PO TABS: 75.0000 mg | ORAL_TABLET | Freq: Every day | ORAL | Status: DC

## 2018-11-01 MED ORDER — ATORVASTATIN CALCIUM 80 MG PO TABS: 80.0000 mg | ORAL_TABLET | Freq: Every day | ORAL | Status: DC

## 2018-11-01 MED ORDER — ATORVASTATIN CALCIUM 40 MG PO TABS: 40.0000 mg | ORAL_TABLET | Freq: Every day | ORAL | Status: DC

## 2018-11-01 MED ORDER — ALUM & MAG HYDROXIDE-SIMETH 200-200-20 MG/5ML PO SUSP
30.0000 mL | ORAL | Status: DC | PRN
  Administered 2018-11-01: 30 mL via ORAL
  Filled 2018-11-01: qty 30

## 2018-11-01 NOTE — Progress Notes (Signed)
Bilateral lower extremity venous duplex completed.  11/01/2018 11:40 AM Maudry Mayhew, MHA, RVT, RDCS, RDMS

## 2018-11-01 NOTE — Care Management Obs Status (Signed)
[place] NOTIFICATION   Patient Details  Name: [name] MRN: [id number] Date of Birth: 11-Jan-1941   Medicare Observation Status Notification Given:  Yes    Carles Collet, RN 11/01/2018, 4:11 PM

## 2018-11-01 NOTE — Evaluation (Signed)
Speech Language Pathology Evaluation Patient Details Name: [name] MRN: [id number] DOB: [birth year] Today's Date: 11/01/2018 Time: 1245-8099 SLP Time Calculation (min) (ACUTE ONLY): 30 min  Problem List:  Patient Active Problem List   Diagnosis Date Noted  . Occipital stroke ([place]) 10/31/2018  . DM2 (diabetes mellitus, type 2) ([place]) 10/31/2018  . NSTEMI (non-ST elevated myocardial infarction) ([place]) 05/28/2018  . Syncope 05/28/2018  . HTN (hypertension) 05/28/2018  . Hyperlipemia, mixed 05/28/2018  . Rash and nonspecific skin eruption 12/16/2017  . Pain in right hand 09/11/2017  . Carpal tunnel syndrome, right upper limb 07/01/2017  . Arthritis of carpometacarpal [facility] joint of left thumb 12/30/2016  . Midfoot skin ulcer, right, limited to breakdown of skin ([place]) 07/22/2016  . 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 Osteomyelitis of ankle or foot, left, acute ([place]) 08/23/2014  . Cellulitis 10/12/2012  . Chest pain 10/12/2012  . Tobacco abuse 10/12/2012  . ABSCESS, AXILLA, LEFT 02/23/2008  . 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 CHEST WALL PAIN, ACUTE 12/03/2007  . CANDIDIASIS, GLANS PENIS 09/16/2007  . HEADACHE 08/04/2007  . 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 Other and unspecified hyperlipidemia 03/05/2007  . GOUT 03/05/2007  . ERECTILE DYSFUNCTION 03/05/2007  . EXTERNAL HEMORRHOIDS 03/05/2007  . VENTRAL HERNIA 03/05/2007  . FATTY LIVER DISEASE 03/05/2007  . Osteoarthritis 03/05/2007  . HEMORRHOIDS, INTERNAL 03/17/2005  . COLONIC POLYPS, HX OF 03/17/2005   Past Medical History:  Past Medical History:  Diagnosis Date  . Arthritis   . Diabetic neuropathy ([place])   . High cholesterol   . Hypertension    denies  . Osteomyelitis (HCC)    Right great toe  . Peripheral vascular disease ([employer])    diabetic  with osteomylitis  . Type II diabetes mellitus ([place])    Past  Surgical History:  Past Surgical History:  Procedure Laterality Date  . AMPUTATION  07/23/2011   Procedure: AMPUTATION DIGIT;  Surgeon: Newt Minion, MD;  Location: [place];  Service: Orthopedics;  Laterality: Right;  Right Great Toe Amputation  . AMPUTATION Right 09/18/2012   Procedure: Right Foot 2nd Ray Amputation;  Surgeon: Newt Minion, MD;  Location: [place];  Service: Orthopedics;  Laterality: Right;  Right Foot 2nd Ray Amputation  . AMPUTATION Right 10/14/2012   Procedure: AMPUTATION FOOT;  Surgeon: Newt Minion, MD;  Location: [place];  Service: Orthopedics;  Laterality: Right;  Right Foot Transmetatarsal Amputation  . AMPUTATION Left 09/23/2014   Procedure: Left Foot Transmetatarsal Amputation;  Surgeon: Newt Minion, MD;  Location: [place];  Service: Orthopedics;  Laterality: Left;  . BACK SURGERY     lower  . CARPAL TUNNEL RELEASE Right 01/30/2018   Procedure: RIGHT CARPAL TUNNEL RELEASE;  Surgeon: Newt Minion, MD;  Location: [place];  Service: Orthopedics;  Laterality: Right;  . CIRCUMCISION  2010  . COLONOSCOPY    . FOOT FRACTURE SURGERY Left 1970's   "broke it playing football" (10/12/2012)  . HUMERUS FRACTURE SURGERY W/ IMPLANT Right 1960's   "put a plate in it" (8/33/8250)  . INCISION AND DRAINAGE OF WOUND Left 2006   "foot" (10/12/2012)  . LUMBAR [place] SURGERY  2008   HPI:  [name] is a 78 y.o. male with medical history significant of type 2 diabetes mellitus, hyperlipidemia, glaucoma, tobacco abuse who presents to [facility] ED with complaint of progressive blurry vision.  Patient reports first noticed worsening  vision over the past 4 to 5 weeks with acute worsening over the past 1 week.  He reports vision is now white/blurry with associated peripheral vision loss to both eyes.  Patient also reports double vision.  Patient continues to smoke tobacco via pipe, otherwise denies any recent medication changes.  No other complaints at this time.  Patient denies headache, no eye pain,  no nausea/vomiting/diarrhea, no chest pain, no palpitations, no fever/chills/night sweats, no shortness of breath,, no issues with bowel/bladder function, no paresthesias, no cough/congestion.  MRI is showing questionable infarcts in bilateral occipital lobes.     Assessment / Plan / Recommendation Clinical Impression  Cognitive/linguistic and motor speech evaluation was completed.  Cranial nerve exam was completed and unremarkable. Lingual, labial, facial and jaw ROM and strength appeared to be adequate.  Sensation also appeared to be intact.  Speech was clear and easy to understand.  No discernible dysarthria or apraxia were noted.  He achieved a score of 23 out of a possible 29 points on the [place] Exam (MMSE).  Due to visual deficits with this event he was unable to see the design that needed to be copied to perform the task.  He was oriented to person, place and situation.  He was partially oriented to time knowing the year, month and day but not the date.  He had good immediate and delayed recall of 3 novel words.  He was unable to complete the attention task accurately, however, his attention to task appeared to be adequate.  Language skills appeared to be intact for what was tested.  He was able to name objects, repeat a sentence, follow a 3 step command, read/comprehend a sentence and write a sentence.  He was able to provide logical solutions to simple problems.  He had mild issues completing the clock drawing task with keeping all the numbers on the "clock face".    Otherwise he completed the task accurately.  He does live alone but has family support from a niece and his girlfriend's daughter whom do all his food shopping, cooking and bill paying.  Per patient he does continue to manage his medications.  Recommend HHST evaluation for in depth cognitive evaluation once the patient is discharged to assess for any possible cognitive issues that may impact his abiilty to function at home as he  was prior to admission.  ST will not follow during acute stay.  Please feel free to re-consult if we can be of further assistance.        SLP Assessment  SLP Recommendation/Assessment: All further Speech Lanaguage Pathology  needs can be addressed in the next venue of care SLP Visit Diagnosis: Attention and concentration deficit    Follow Up Recommendations  Home health SLP          SLP Evaluation Cognition  Overall Cognitive Status: No family/caregiver present to determine baseline cognitive functioning Arousal/Alertness: Awake/alert Orientation Level: Oriented to person;Oriented to place;Oriented to situation;Disoriented to time Attention: Sustained Sustained Attention: Appears intact Memory: Appears intact Problem Solving: Appears intact       Comprehension  Auditory Comprehension Overall Auditory Comprehension: Appears within functional limits for tasks assessed Commands: Within Functional Limits Conversation: Simple Reading Comprehension Reading Status: Within funtional limits    Expression Expression Primary Mode of Expression: Verbal Verbal Expression Overall Verbal Expression: Appears within functional limits for tasks assessed Initiation: No impairment Automatic Speech: Name;Social Response Level of Generative/Spontaneous Verbalization: Sentence;Conversation Repetition: No impairment Naming: No impairment Pragmatics: No impairment Non-Verbal  Means of Communication: Not applicable Written Expression Dominant Hand: Right Written Expression: Within Functional Limits   Oral / Motor  Oral Motor/Sensory Function Overall Oral Motor/Sensory Function: Within functional limits Motor Speech Overall Motor Speech: Appears within functional limits for tasks assessed Respiration: Within functional limits Phonation: Normal Resonance: Within functional limits Articulation: Within functional limitis Intelligibility: Intelligible Motor Planning: Witnin functional  limits Motor Speech Errors: Not applicable   GO                    Shelly Flatten, MA, CCC-SLP Acute Rehab SLP [phone]  Lamar Sprinkles 11/01/2018, 1:07 PM

## 2018-11-01 NOTE — Progress Notes (Signed)
NURSING PROGRESS NOTE  [name] [id number] Discharge Data: 11/01/2018 6:01 PM Attending Provider: Mariel Aloe, MD MBP:[id number], Ronie Spies, MD     Ladon Applebaum to be D/C'd Home per MD order.  Discussed with the patient the After Visit Summary and all questions fully answered. All IV's discontinued with no bleeding noted. All belongings returned to patient for patient to take home.   Last Vital Signs:  Blood pressure 127/72, pulse (!) 58, temperature 97.8 F (36.6 C), temperature source Oral, resp. rate 17, height 6\' 1"  (1.854 m), weight 80.4 kg, SpO2 100 %.  Discharge Medication List Allergies as of 11/01/2018   No Known Allergies     Medication List    STOP taking these medications   aspirin 81 MG EC tablet     TAKE these medications   acetaminophen 500 MG tablet Commonly known as:  TYLENOL Take 1,000 mg by mouth every 6 (six) hours as needed for mild pain.   Alphagan P 0.1 % Soln Generic drug:  brimonidine Place 1 drop into both eyes 2 (two) times daily.   amoxicillin-clavulanate 875-125 MG tablet Commonly known as:  AUGMENTIN Take 1 tablet by mouth 2 (two) times daily.   ARTIFICIAL TEARS PF OP Place 2 drops into both eyes 3 (three) times daily as needed.   atorvastatin 40 MG tablet Commonly known as:  LIPITOR Take 1 tablet (40 mg total) by mouth daily at 6 PM.   clopidogrel 75 MG tablet Commonly known as:  PLAVIX Take 1 tablet (75 mg total) by mouth daily.   latanoprost 0.005 % ophthalmic solution Commonly known as:  XALATAN Place 1 drop into both eyes at bedtime.   metFORMIN 1000 MG tablet Commonly known as:  GLUCOPHAGE Take 1,000 mg by mouth 2 (two) times daily.   pyridOXINE 50 MG tablet Commonly known as:  VITAMIN B-6 Take 50 mg by mouth 2 (two) times daily.   thiamine 100 MG tablet Commonly known as:  VITAMIN B-1 Take 100 mg by mouth daily.   timolol 0.5 % ophthalmic solution Commonly known as:  TIMOPTIC Place 1 drop into both eyes 2  (two) times daily.   vitamin B-12 500 MCG tablet Commonly known as:  CYANOCOBALAMIN Take 500 mcg by mouth daily.

## 2018-11-01 NOTE — Progress Notes (Signed)
  Echocardiogram 2D Echocardiogram has been performed.  [name] 11/01/2018, 10:36 AM

## 2018-11-01 NOTE — Evaluation (Signed)
Occupational Therapy Evaluation Patient Details Name: [name] MRN: [id number] DOB: 20-Oct-1940 Today's Date: 11/01/2018    History of Present Illness Patient is a 78 y/o male presenting to the ED on 10/31/2018 with primary complaints of blurry vision. Past medical history significant of type 2 diabetes mellitus, hyperlipidemia, glaucoma, tobacco abuse. MR brain notable for acute versus subacute punctate cortical infarcts occipital lobe.   Clinical Impression   Pt admitted with the above, and demonstrates the below listed deficits.  He reports vision changed ~ 3 mos PTA, and he has been managing at home since that time.  He demonstrates Lt inferior field deficit, and reduced acuity as well as impaired balance.  He is able to perform ADLs mod I.  He lives alone, and has good support from nieces who assist with groceries, medication management, finances and transportation.  He has a PCA 2x/wk who assist him with showering.   Recommend follow up appointment with ophthalmologist, HHOT and PT as well as HHRN to assist with diabetes management as he does not check his Blood sugar daily due to inability to see the monitor.  All education completed.  All further OT needs can be addressed by [place].     Follow Up Recommendations  HHOT;Diabetes RN to help with monitoring of blood glucose level )    Equipment Recommendations       Recommendations for Other Services       Precautions / Restrictions Precautions Precautions: Fall Precaution Comments: reduced vision Restrictions Weight Bearing Restrictions: No      Mobility Bed Mobility Overal bed mobility: Modified Independent             General bed mobility comments: sit to supine  Transfers Overall transfer level: Needs assistance Equipment used: Rolling walker (2 wheeled) Transfers: Sit to/from [facility] Sit to Stand: Min guard Stand pivot transfers: Min guard       General transfer comment: for safety; flexed  posture throughout    Balance Overall balance assessment: Needs assistance Sitting-balance support: No upper extremity supported;Feet supported Sitting balance-Leahy Scale: Fair     Standing balance support: Bilateral upper extremity supported;During functional activity Standing balance-Leahy Scale: Poor Standing balance comment: reliant on RW                           ADL either performed or assessed with clinical judgement   ADL Overall ADL's : Modified independent                                     Functional mobility during ADLs: Modified independent General ADL Comments: Pt braces against the bed to pull pants over hips      Vision Baseline Vision/History: (blurry vision ) Patient Visual Report: Blurring of vision Vision Assessment?: Yes Eye Alignment: Within Functional Limits Ocular Range of Motion: Within Functional Limits Alignment/Gaze Preference: Within Defined Limits Tracking/Visual Pursuits: Able to track stimulus in all quads without difficulty Visual Fields: Left visual field deficit Additional Comments: Pt has a mild Lt inferior field deficit per confrontation testing.  He reports his vision is "white", but is able to locate needed items, dial phone, etc.  He reports  his vision worsened about 3 mos ago, but just now sought care.  He has not seen and ophthalmologist in over a year.       Perception Perception Perception Tested?: Yes  Praxis      Pertinent Vitals/Pain Pain Assessment: No/denies pain     Hand Dominance Right   Extremity/Trunk Assessment Upper Extremity Assessment Upper Extremity Assessment: Overall WFL for tasks assessed   Lower Extremity Assessment Lower Extremity Assessment: Generalized weakness(bil. transmetatarsal amputations )   Cervical / Trunk Assessment Cervical / Trunk Assessment: Kyphotic   Communication Communication Communication: No difficulties   Cognition Arousal/Alertness:  Awake/alert Behavior During Therapy: WFL for tasks assessed/performed Overall Cognitive Status: Within Functional Limits for tasks assessed                                     General Comments  Pt reports he doesn't monitor his blood sugars because he can't see the monitor.  He has a Proofreader wash his laundry.  He cooks sometimes, but sometimes "buys a sandwich".   Recommended to pt that he see ophthalmologist upon discharge and he agrees    Exercises     Shoulder Instructions      Home Living Family/patient expects to be discharged to:: Private residence Living Arrangements: Alone Available Help at Discharge: Family;Available PRN/intermittently Type of Home: Apartment Home Access: Level entry     Home Layout: One level     Bathroom Shower/Tub: Teacher, early years/pre: Standard     Home Equipment: Environmental consultant - 2 wheels;Cane - single point   Additional Comments: Pt reports he has a PCA 2 days/week who helps him shower.  He has 2 life alerts and nieces and friends help with transportation and gorceries       Prior Functioning/Environment Level of Independence: Independent with assistive device(s)        Comments: use of RW vs walking stick; niece helps with driving and paying bills as well as groceries         OT Problem List: Impaired balance (sitting and/or standing);Impaired vision/perception      OT Treatment/Interventions:      OT Goals(Current goals can be found in the care plan section) Acute Rehab OT Goals Patient Stated Goal: return home soon OT Goal Formulation: All assessment and education complete, DC therapy  OT Frequency:     Barriers to D/C:            Co-evaluation              AM-PAC OT "6 Clicks" Daily Activity     Outcome Measure Help from another person eating meals?: None Help from another person taking care of personal grooming?: None Help from another person toileting, which includes using toliet,  bedpan, or urinal?: None Help from another person bathing (including washing, rinsing, drying)?: None Help from another person to put on and taking off regular upper body clothing?: None Help from another person to put on and taking off regular lower body clothing?: None 6 Click Score: 24   End of Session Nurse Communication: Mobility status  Activity Tolerance: Patient tolerated treatment well Patient left: in bed;with call bell/phone within reach  OT Visit Diagnosis: Low vision, both eyes (H54.2)                Time: 1610-9604 OT Time Calculation (min): 28 min Charges:  OT General Charges $OT Visit: 1 Visit OT Evaluation $OT Eval Moderate Complexity: 1 Mod OT Treatments $Self Care/Home Management : 8-22 mins  Lucille Passy, OTR/L Acute Rehabilitation Services Pager [phone] Office [phone]   Lucille Passy M 11/01/2018, 5:00  PM

## 2018-11-01 NOTE — Progress Notes (Signed)
Patient c/o indigestion. Standing order for Maalox activated and administered. Will continue to monitor.

## 2018-11-01 NOTE — Progress Notes (Addendum)
STROKE TEAM PROGRESS NOTE   SUBJECTIVE (INTERVAL HISTORY) No family is at the bedside.  He is eager to go home. PT recommend [facility]. He still has some visual deficit but no gross hemianopia.   OBJECTIVE Vitals:   10/31/18 2211 11/01/18 0023 11/01/18 0520 11/01/18 0845  BP: (!) 153/100 121/70 103/64 137/69  Pulse: 63 62 (!) 51 (!) 57  Resp: 20 18 17 17   Temp: (!) 97.5 F (36.4 C) 97.9 F (36.6 C) 98.6 F (37 C) 98.8 F (37.1 C)  TempSrc: Axillary Oral Oral Oral  SpO2: 100% 99% 100% 100%  Weight:      Height:        CBC:  Recent Labs  Lab 10/31/18 1306  WBC 5.5  NEUTROABS 1.9  HGB 11.0*  HCT 35.3*  MCV 88.5  PLT 400    Basic Metabolic Panel:  Recent Labs  Lab 10/31/18 1306 11/01/18 0427  NA 137 136  K 3.7 3.7  CL 105 106  CO2 24 23  GLUCOSE 96 95  BUN 8 11  CREATININE 1.26* 1.09  CALCIUM 8.8* 8.5*    Lipid Panel:     Component Value Date/Time   CHOL 136 11/01/2018 0427   TRIG 42 11/01/2018 0427   HDL 42 11/01/2018 0427   CHOLHDL 3.2 11/01/2018 0427   VLDL 8 11/01/2018 0427   LDLCALC 86 11/01/2018 0427   HgbA1c:  Lab Results  Component Value Date   HGBA1C 5.4 11/01/2018   Urine Drug Screen:     Component Value Date/Time   LABOPIA NONE DETECTED 06/24/2017 1802   COCAINSCRNUR NONE DETECTED 06/24/2017 1802   LABBENZ NONE DETECTED 06/24/2017 1802   AMPHETMU NONE DETECTED 06/24/2017 1802   THCU NONE DETECTED 06/24/2017 1802   LABBARB NONE DETECTED 06/24/2017 1802    Alcohol Level No results found for: ETH  IMAGING  Ct Angio Head W Or Wo Contrast Ct Angio Neck W Or Wo Contrast 10/31/2018 IMPRESSION:  1. No acute intracranial abnormality.  2. Negative for large vessel occlusion.  3. No significant intracranial or extracranial stenosis. Mild stenosis of the supraclinoid internal carotid artery and mild irregularity of the posterior cerebral arteries bilaterally.  4. Normal dural sinus enhancement without evidence of thrombosis.  Mr [name] And  Wo Contrast 10/31/2018 IMPRESSION:  1. T2 signal change in some diffusion abnormality within the occipital lobes bilaterally concerning for acute/subacute punctate cortical infarcts of the occipital lobe. This corresponds with visual abnormalities.  2. Signal abnormality in the posterosuperior sagittal sinus may be artifactual. There does appear to be contrast within the superior sagittal sinus. MR venogram would be useful for further evaluation of sinus stenosis or thrombosis as an etiology of the occipital lobe disease.  3. Moderate atrophy and white matter disease is otherwise within normal limits for age.  4. Degenerative changes within the cervical spine.    Transthoracic Echocardiogram  11/01/2018 IMPRESSIONS  1. The left ventricle has low normal systolic function, with an ejection fraction of 50-55%. The cavity size was normal. There is severe concentric left ventricular hypertrophy. Left ventricular diastolic Doppler parameters are consistent with  restrictive filling. Elevated left ventricular end-diastolic pressure.  2. The right ventricle has normal systolic function. The cavity was normal. There is no increase in right ventricular wall thickness.  3. Left atrial size was mild-moderately dilated.  4. The mitral valve is degenerative. Mild thickening of the anterior and posterior mitral valve leaflets. There is mild mitral annular calcification present.  5. The aortic  valve is tricuspid. Mild sclerosis of the aortic valve.  6. Agitated saline contrast was given intravenously to evaluate for intracardiac shunting. Saline contrast bubble study was negative, with no evidence of any interatrial shunt.   EKG - SR rate 65 BPM. (See cardiology reading for complete details)   PHYSICAL EXAM  Temp:  [97.5 F (36.4 C)-98.8 F (37.1 C)] 98.5 F (36.9 C) (05/17 1230) Pulse Rate:  [51-66] 56 (05/17 1230) Resp:  [14-20] 17 (05/17 1230) BP: (103-163)/(64-100) 118/70 (05/17 1230) SpO2:  [99  %-100 %] 100 % (05/17 1230) Weight:  [80.4 kg] 80.4 kg (05/16 1957)  General - Well nourished, well developed, in no apparent distress.  Ophthalmologic - fundi not visualized due to noncooperation.  Cardiovascular - Regular rate and rhythm.  Mental Status -  Level of arousal and orientation to time, place, and person were intact. Language including expression, naming, repetition, comprehension was assessed and found intact. Fund of Knowledge was assessed and was intact, able to recall two past presidents  Cranial Nerves II - XII - II - Visual field showed no gross hemianopia with hand waving test, however with finger counting he did miss some fingers . III, IV, VI - Extraocular movements intact. V - Facial sensation intact bilaterally. VII - Facial movement intact bilaterally. VIII - Hearing & vestibular intact bilaterally. X - Palate elevates symmetrically. XI - Chin turning & shoulder shrug intact bilaterally. XII - Tongue protrusion intact.  Motor Strength - The patient's strength was normal in all extremities and pronator drift was absent except bilateral toes amputation.  Bulk was normal and fasciculations were absent.   Motor Tone - Muscle tone was assessed at the neck and appendages and was normal.  Reflexes - The patient's reflexes were symmetrical in all extremities and he had no pathological reflexes.  Sensory - Light touch, temperature/pinprick were assessed and were symmetrical.    Coordination - The patient had normal movements in the hands with no ataxia or dysmetria.  Tremor was absent.  Gait and Station - deferred.   ASSESSMENT/PLAN Mr. [name] is a 78 y.o. male with history of dm2 with complications and amputations, HTN, HLD, PVD, arthritis and glaucoma presenting with progressing poor blurry vision for ~65mo.  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   CTA H&N mild irregularity at distal PCAs. No CVT  2D Echo  - EF 50-55%. No cardiac source of emboli identified.   LE venous doppler - negative for DVT  Discussed with Dr. Rayann Heman, will do outpt loop recorder placement.  Sars Corona Virus 2 - negative  LDL - 86  HgbA1c - 5.4  VTE prophylaxis - Lovenox  Diet - Heart healthy / carb modified with thin liquids.  aspirin 81 mg daily prior to admission, now on plavix daily. Continue on discharge.  Patient counseled to be compliant with his antithrombotic medications  Ongoing aggressive stroke risk factor management  Therapy recommendations:  HH PT recommended  Disposition:  Pending  Hypertension  Stable . Long-term BP goal normotensive  Hyperlipidemia  Lipid lowering medication PTA:  Lipitor 40 mg daily  LDL 86, goal <  70  Current lipid lowering medication: Lipitor 40 mg daily  Continue statin at discharge  Diabetes  HgbA1c 5.4, goal < 7.0  Controlled  SSI  CBG monitoring  Other Stroke Risk Factors  Advanced age  Pipe smoker - advised to stop smoking  PVD - s/p b/l toe amputations  Other Active Problems  Peripheral neuropathy - following with Dr. Posey Pronto at [facility] day # 0  Neurology will sign off. Please call with questions. Pt will follow up with Dr. Posey Pronto at [facility] in about 4-6 weeks. Thanks for the consult.  Rosalin Hawking, MD PhD Stroke Neurology 11/01/2018 3:11 PM    To contact Stroke Continuity provider, please refer to [email or web address] After hours, contact General Neurology

## 2018-11-01 NOTE — Discharge Summary (Signed)
Physician Discharge Summary  [name] ZJQ:[id number] DOB: 01-11-1941 DOA: 10/31/2018  PCP: [name], MD  Admit date: 10/31/2018 Discharge date: 11/01/2018  Admitted From: Home Disposition: Home  Recommendations for Outpatient Follow-up:  1. Follow up with PCP in 1 week 2. Please obtain BMP/CBC in one week 3. Follow up with neurology in 4 weeks 4. Follow up with cardiology for a loop recorder 5. Please follow up on the following pending results: None  Home Health: PT, OT Equipment/Devices: None  Discharge Condition: Stable CODE STATUS: DNR Diet recommendation: Heart healthy   Brief/Interim Summary:  Admission HPI written by Eric J [place] DO   Chief Complaint: Blurry vision  HPI: [name] is a 78 y.o. male with medical history significant of type 2 diabetes mellitus, hyperlipidemia, glaucoma, tobacco abuse who presents to [facility] ED with complaint of progressive blurry vision.  Patient reports first noticed worsening vision over the past 4 to 5 weeks with acute worsening over the past 1 week.  He reports vision is now white/blurry with associated peripheral vision loss to both eyes.  Patient also reports double vision.  Patient continues to smoke tobacco via pipe, otherwise denies any recent medication changes.  No other complaints at this time.  Patient denies headache, no eye pain, no nausea/vomiting/diarrhea, no chest pain, no palpitations, no fever/chills/night sweats, no shortness of breath,, no issues with bowel/bladder function, no paresthesias, no cough/congestion.  ED Course: Evaluation in the ED, patient is afebrile, with temperature 98.7, HR 51, RR 16, BP 128/69, SPO2 100% on room air.  WBC count 5.5, hemoglobin 11.0, sodium 137, potassium 3.7, BUN 8, creatinine 1.26, glucose 96.  TSH 0.958.  EKG notable for normal sinus rhythm with right bundle branch block, left atrial enlargement, rate 65, no concerning ST elevation/depression.  Patient  underwent fluorescein staining by ED provider with no lacerations noted.  Tono-Pen with pressures on left 18, right 14; which are normal.  MR brain notable for acute versus subacute punctate cortical infarcts occipital lobe.  Case was discussed with neuro hospitalist who recommended medical admission for further stroke work-up.  Hospitalist service consulted for admission.   Hospital course:  Occipital CVA Subacute CVA seen on MRI brain.  LDL of 86, hemoglobin A1c 5.4%.  TSH of 0.958.  CT angiogram of the head and neck significant for no acute abnormality or significant intracranial/extracranial stenosis.  Transthoracic echocardiogram significant for normal bubble study and no visualized LV thrombus. Neurology team was consulted and recommends Plavix and loop recorder.  PT consulted and recommends home health PT.  Diabetes mellitus, type 2 Hemoglobin A1c of 5.4%.  Recommend continuing home metformin.  CKD stage III Creatinine of 1.26 on admission.  Currently at baseline.  Hyperlipidemia Patient is currently on Lipitor 40 mg daily.  LDL this admission of 86  Tobacco abuse disorder Cessation discussed with patient on admission.  Follow-up with PCP  Discharge Diagnoses:  Principal Problem:   Occipital stroke ([name]) Active Problems:   Tobacco abuse   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[place])   Hyperlipemia, mixed   DM2 (diabetes mellitus, type 2) [facility])    Discharge Instructions  Discharge Instructions    Ambulatory referral to Cardiac Electrophysiology   Complete by:  As directed    Request Loop recorder placement. Thank you.   Ambulatory referral to Neurology   Complete by:  As directed    An appointment is requested in approximately: 4-6 weeks   Call MD for:  persistant dizziness or light-headedness  Complete by:  As directed    Diet - low sodium heart healthy   Complete by:  As directed    Increase activity slowly   Complete by:  As directed      Allergies as of  11/01/2018   No Known Allergies     Medication List    STOP taking these medications   aspirin 81 MG EC tablet     TAKE these medications   acetaminophen 500 MG tablet Commonly known as:  TYLENOL Take 1,000 mg by mouth every 6 (six) hours as needed for mild pain.   Alphagan P 0.1 % Soln Generic drug:  brimonidine Place 1 drop into both eyes 2 (two) times daily.   amoxicillin-clavulanate 875-125 MG tablet Commonly known as:  AUGMENTIN Take 1 tablet by mouth 2 (two) times daily.   ARTIFICIAL TEARS PF OP Place 2 drops into both eyes 3 (three) times daily as needed.   atorvastatin 40 MG tablet Commonly known as:  LIPITOR Take 1 tablet (40 mg total) by mouth daily at 6 PM.   clopidogrel 75 MG tablet Commonly known as:  PLAVIX Take 1 tablet (75 mg total) by mouth daily.   latanoprost 0.005 % ophthalmic solution Commonly known as:  XALATAN Place 1 drop into both eyes at bedtime.   metFORMIN 1000 MG tablet Commonly known as:  GLUCOPHAGE Take 1,000 mg by mouth 2 (two) times daily.   pyridOXINE 50 MG tablet Commonly known as:  VITAMIN B-6 Take 50 mg by mouth 2 (two) times daily.   thiamine 100 MG tablet Commonly known as:  VITAMIN B-1 Take 100 mg by mouth daily.   timolol 0.5 % ophthalmic solution Commonly known as:  TIMOPTIC Place 1 drop into both eyes 2 (two) times daily.   vitamin B-12 500 MCG tablet Commonly known as:  CYANOCOBALAMIN Take 500 mcg by mouth daily.      Follow-up Information    [name], MD. Schedule an appointment as soon as possible for a visit in 1 week(s).   Specialty:  Internal Medicine Contact information: [address] [place] [address] [phone]        [place], MD. Schedule an appointment as soon as possible for a visit.   Specialty:  Ophthalmology Why:  Follow up for vision Contact information: [place] [address] [phone]        Thompson Grayer, MD.  Schedule an appointment as soon as possible for a visit in 3 week(s).   Specialty:  Cardiology Why:  loop recorder placement Contact information: [place] [address] [address] [phone]        Alda Berthold, DO. Schedule an appointment as soon as possible for a visit in 4 week(s).   Specialty:  Neurology Contact information: [place] [address] [phone]          No Known Allergies  Consultations:  Neurology   Procedures/Studies: Ct Angio Head W Or Wo Contrast  Result Date: 10/31/2018 CLINICAL DATA:  Stroke EXAM: CT ANGIOGRAPHY HEAD AND NECK TECHNIQUE: Multidetector CT imaging of the head and neck was performed using the standard protocol during bolus administration of intravenous contrast. Multiplanar CT image reconstructions and MIPs were obtained to evaluate the vascular anatomy. Carotid stenosis measurements (when applicable) are obtained utilizing NASCET criteria, using the distal internal carotid diameter as the denominator. CONTRAST:  142m OMNIPAQUE IOHEXOL 350 MG/ML SOLN COMPARISON:  MRI head 10/31/2018  FINDINGS: CT HEAD FINDINGS Brain: Generalized atrophy. Small chronic infarct right occipital lobe. No acute infarct. Negative for hemorrhage or mass. Vascular: Negative for hyperdense vessel Skull: Diffuse calvarial thickening without acute abnormality. Sinuses: Negative Orbits: Bilateral ocular valves.  No orbital mass. Review of the MIP images confirms the above findings CTA NECK FINDINGS Aortic arch: Standard branching. Imaged portion shows no evidence of aneurysm or dissection. No significant stenosis of the major arch vessel origins. Right carotid system: Mild atherosclerotic calcification right carotid bulb. No significant carotid stenosis. Left carotid system: Left carotid widely patent without stenosis or atherosclerotic disease Vertebral arteries: Both vertebral arteries widely patent without stenosis  Skeleton: Cervical disc degeneration and spurring. No acute skeletal abnormality. 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 Mild stenosis of the supraclinoid internal carotid artery bilaterally. Anterior and middle cerebral arteries patent without significant stenosis bilaterally. Posterior circulation: Both vertebral arteries patent to the basilar. PICA patent bilaterally. AICA, superior cerebellar, and posterior cerebral arteries patent bilaterally. Mild irregularity of the posterior cerebral artery bilaterally without significant stenosis. Venous sinuses: Normal enhancement of the venous sinuses. Considerable dural calcification is seen around the sagittal sinus which likely accounts for the signal changes on MRI. Anatomic variants: None Delayed phase: Normal enhancement of the brain. Review of the MIP images confirms the above findings IMPRESSION: 1. No acute intracranial abnormality. 2. Negative for large vessel occlusion. 3. No significant intracranial or extracranial stenosis. Mild stenosis of the supraclinoid internal carotid artery and mild irregularity of the posterior cerebral arteries bilaterally. 4. Normal dural sinus enhancement without evidence of thrombosis. Electronically Signed   By: Franchot Gallo M.D.   On: 10/31/2018 19:41   Dg Chest 2 View  Result Date: 11/01/2018 CLINICAL DATA:  CVA EXAM: CHEST - 2 VIEW COMPARISON:  05/28/2018 FINDINGS: Lungs are clear.  No pleural effusion or pneumothorax. The heart is normal in size. Degenerative changes of the thoracic spine. IMPRESSION: Normal chest radiographs. Electronically Signed   By: Julian Hy M.D.   On: 11/01/2018 06:21   Ct Angio Neck W Or Wo Contrast  Result Date: 10/31/2018 CLINICAL DATA:  Stroke EXAM: CT ANGIOGRAPHY HEAD AND NECK TECHNIQUE: Multidetector CT imaging of the head and neck  was performed using the standard protocol during bolus administration of intravenous contrast. Multiplanar CT image reconstructions and MIPs were obtained to evaluate the vascular anatomy. Carotid stenosis measurements (when applicable) are obtained utilizing NASCET criteria, using the distal internal carotid diameter as the denominator. CONTRAST:  16m OMNIPAQUE IOHEXOL 350 MG/ML SOLN COMPARISON:  MRI head 10/31/2018 FINDINGS: CT HEAD FINDINGS Brain: Generalized atrophy. Small chronic infarct right occipital lobe. No acute infarct. Negative for hemorrhage or mass. Vascular: Negative for hyperdense vessel Skull: Diffuse calvarial thickening without acute abnormality. Sinuses: Negative Orbits: Bilateral ocular valves.  No orbital mass. Review of the MIP images confirms the above findings CTA NECK FINDINGS Aortic arch: Standard branching. Imaged portion shows no evidence of aneurysm or dissection. No significant stenosis of the major arch vessel origins. Right carotid system: Mild atherosclerotic calcification right carotid bulb. No significant carotid stenosis. Left carotid system: Left carotid widely patent without stenosis or atherosclerotic disease Vertebral arteries: Both vertebral arteries widely patent without stenosis Skeleton: Cervical disc degeneration and spurring. No acute skeletal abnormality. Other neck: Negative Upper chest: Negative Review of the MIP images confirms the above findings CTA HEAD FINDINGS Anterior circulation: Atherosclerotic disease in the  cavernous carotid. Mild stenosis of the supraclinoid internal carotid artery bilaterally. Anterior and middle cerebral arteries patent without significant stenosis bilaterally. Posterior circulation: Both vertebral arteries patent to the basilar. PICA patent bilaterally. AICA, superior cerebellar, and posterior cerebral arteries patent bilaterally. Mild irregularity of the posterior cerebral artery bilaterally without significant stenosis. Venous  sinuses: Normal enhancement of the venous sinuses. Considerable dural calcification is seen around the sagittal sinus which likely accounts for the signal changes on MRI. Anatomic variants: None Delayed phase: Normal enhancement of the brain. Review of the MIP images confirms the above findings IMPRESSION: 1. No acute intracranial abnormality. 2. Negative for large vessel occlusion. 3. No significant intracranial or extracranial stenosis. Mild stenosis of the supraclinoid internal carotid artery and mild irregularity of the posterior cerebral arteries bilaterally. 4. Normal dural sinus enhancement without evidence of thrombosis. Electronically Signed   By: Franchot Gallo M.D.   On: 10/31/2018 19:41   Mr Brain W And Wo Contrast  Result Date: 10/31/2018 CLINICAL DATA:  Progressive worsening vision. Vision has acutely worsened over the last week with associated diplopia. EXAM: MRI HEAD WITHOUT AND WITH CONTRAST TECHNIQUE: Multiplanar, multiecho pulse sequences of the brain and surrounding structures were obtained without and with intravenous contrast. CONTRAST:  9 mL Gadavist COMPARISON:  CT head without contrast 05/28/2018. MRI brain 06/24/2017 FINDINGS: Brain: Punctate diffusion signal abnormality is present in the posterior occipital lobes bilaterally on coronal image 45 of series 7 and axial image 80 of series 5. There is some T2 signal change within the occipital lobes bilaterally as well. This raises concern for acute/subacute ischemia. No other acute or focal infarct is present. Mild generalized atrophy and white matter disease is otherwise within normal limits for age. The ventricles are proportionate to the degree of atrophy. Basal ganglia are intact. The internal auditory canals are within normal limits. The brainstem and cerebellum are within normal limits. The postcontrast images demonstrate no pathologic enhancement. Vascular: Flow is present in the major intracranial arteries. There is some signal  irregularity within the superior sagittal sinus above the torcular. Skull and upper cervical spine: Craniocervical junction is normal. Mild degenerative changes are noted at C2-3 and C3-4. Upper cervical spine is within normal limits otherwise. Sinuses/Orbits: Minimal fluid is present in the left mastoid air cells. The paranasal sinuses and mastoid air cells are otherwise clear. Bilateral lens replacements are noted. Globes and orbits are otherwise unremarkable. IMPRESSION: 1. T2 signal change in some diffusion abnormality within the occipital lobes bilaterally concerning for acute/subacute punctate cortical infarcts of the occipital lobe. This corresponds with visual abnormalities. 2. Signal abnormality in the posterosuperior sagittal sinus may be artifactual. There does appear to be contrast within the superior sagittal sinus. MR venogram would be useful for further evaluation of sinus stenosis or thrombosis as an etiology of the occipital lobe disease. 3. Moderate atrophy and white matter disease is otherwise within normal limits for age. 4. Degenerative changes within the cervical spine. Electronically Signed   By: San Morelle M.D.   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  There is a skin ulcer present over the central plantar tar skin but this does not appear to track to bone on today's radiographs.  Xr Foot Complete Right  Result Date: 10/28/2018 Radiographs of the right foot status post trans-met  amputation and skin ulceration with tracking into deeper tissue however it does not appear to track completely to bone.  There may be bony resorption versus possible early osteomyelitis over the medial metatarsal.  Vas [place] Lower Extremity Venous  (dvt)  Result Date: 11/01/2018  Lower Venous Study Indications: Stroke.  Limitations: Body habitus. Performing Technologist: Maudry Mayhew MHA, RDMS, RVT, RDCS  Examination Guidelines: A complete evaluation includes B-mode imaging, spectral Doppler, color Doppler, and power Doppler as needed of all accessible portions of each vessel. Bilateral testing is considered an integral part of a complete examination. Limited examinations for reoccurring indications may be performed as noted.  +---------+---------------+---------+-----------+----------+-------+ RIGHT    CompressibilityPhasicitySpontaneityPropertiesSummary +---------+---------------+---------+-----------+----------+-------+ CFV      Full           Yes      Yes                          +---------+---------------+---------+-----------+----------+-------+ SFJ      Full                                                 +---------+---------------+---------+-----------+----------+-------+ FV Prox  Full                                                 +---------+---------------+---------+-----------+----------+-------+ FV Mid   Full                                                 +---------+---------------+---------+-----------+----------+-------+ FV DistalFull                                                 +---------+---------------+---------+-----------+----------+-------+ PFV      Full                                                 +---------+---------------+---------+-----------+----------+-------+ POP      Full           Yes      Yes                          +---------+---------------+---------+-----------+----------+-------+ PTV      Full                                                 +---------+---------------+---------+-----------+----------+-------+ PERO     Full                                                 +---------+---------------+---------+-----------+----------+-------+    +---------+---------------+---------+-----------+----------+------------------+  LEFT     CompressibilityPhasicitySpontaneityPropertiesSummary            +---------+---------------+---------+-----------+----------+------------------+ CFV      Full           Yes      Yes                                     +---------+---------------+---------+-----------+----------+------------------+ SFJ      Full                                                            +---------+---------------+---------+-----------+----------+------------------+ FV Prox  Full                                                            +---------+---------------+---------+-----------+----------+------------------+ FV Mid   Full                                                            +---------+---------------+---------+-----------+----------+------------------+ FV DistalFull                                                            +---------+---------------+---------+-----------+----------+------------------+ PFV      Full                                                            +---------+---------------+---------+-----------+----------+------------------+ POP      Full           Yes      Yes                                     +---------+---------------+---------+-----------+----------+------------------+ PTV                              Yes                  patent by color                                                          Doppler            +---------+---------------+---------+-----------+----------+------------------+ PERO  Yes                  patent by color                                                          Doppler            +---------+---------------+---------+-----------+----------+------------------+     Summary: Right: There is no evidence of deep vein thrombosis in the lower extremity. No cystic structure  found in the popliteal fossa. Ultrasound characteristics of enlarged lymph nodes are noted in the groin. Left: There is no evidence of deep vein thrombosis in the lower extremity. No cystic structure found in the popliteal fossa.  *See table(s) above for measurements and observations.    Preliminary     11/01/2018: Transthoracic echocardiogram IMPRESSIONS    1. The left ventricle has low normal systolic function, with an ejection fraction of 50-55%. The cavity size was normal. There is severe concentric left ventricular hypertrophy. Left ventricular diastolic Doppler parameters are consistent with  restrictive filling. Elevated left ventricular end-diastolic pressure.  2. The right ventricle has normal systolic function. The cavity was normal. There is no increase in right ventricular wall thickness.  3. Left atrial size was mild-moderately dilated.  4. The mitral valve is degenerative. Mild thickening of the anterior and posterior mitral valve leaflets. There is mild mitral annular calcification present.  5. The aortic valve is tricuspid. Mild sclerosis of the aortic valve.  6. Agitated saline contrast was given intravenously to evaluate for intracardiac shunting. Saline contrast bubble study was negative, with no evidence of any interatrial shunt.  11/01/2018: LE venous duplex (preliminary) Summary: Right: There is no evidence of deep vein thrombosis in the lower extremity. No cystic structure found in the popliteal fossa. Ultrasound characteristics of enlarged lymph nodes are noted in the groin. Left: There is no evidence of deep vein thrombosis in the lower extremity. No cystic structure found in the popliteal fossa.   Subjective: Still with some blurry vision.  No other issues.  Ready to go home.  Discharge Exam: Vitals:   11/01/18 1230 11/01/18 1544  BP: 118/70 127/72  Pulse: (!) 56 (!) 58  Resp: 17 17  Temp: 98.5 F (36.9 C) 97.8 F (36.6 C)  SpO2: 100% 100%   Vitals:    11/01/18 0520 11/01/18 0845 11/01/18 1230 11/01/18 1544  BP: 103/64 137/69 118/70 127/72  Pulse: (!) 51 (!) 57 (!) 56 (!) 58  Resp: '17 17 17 17  '$ Temp: 98.6 F (37 C) 98.8 F (37.1 C) 98.5 F (36.9 C) 97.8 F (36.6 C)  TempSrc: Oral Oral Oral Oral  SpO2: 100% 100% 100% 100%  Weight:      Height:        General: Pt is alert, awake, not in acute distress Cardiovascular: RRR, S1/S2 +, no rubs, no gallops Respiratory: CTA bilaterally, no wheezing, no rhonchi Abdominal: Soft, NT, ND, bowel sounds + Extremities: no edema, no cyanosis Neuro: Strength is equal bilaterally.  Vision is blurry with normal extraocular movements    The results of significant diagnostics from this hospitalization (including imaging, microbiology, ancillary and laboratory) are listed below for reference.     Microbiology: Recent Results (from the past 240 hour(s))  SARS Coronavirus 2 (CEPHEID - Performed in [facility] hospital lab),  Hosp Order     Status: None   Collection Time: 10/31/18  5:54 PM  Result Value Ref Range Status   SARS Coronavirus 2 NEGATIVE NEGATIVE Final    Comment: (NOTE) If result is NEGATIVE SARS-CoV-2 target nucleic acids are NOT DETECTED. The SARS-CoV-2 RNA is generally detectable in upper and lower  respiratory specimens during the acute phase of infection. The lowest  concentration of SARS-CoV-2 viral copies this assay can detect is 250  copies / mL. A negative result does not preclude SARS-CoV-2 infection  and should not be used as the sole basis for treatment or other  patient management decisions.  A negative result may occur with  improper specimen collection / handling, submission of specimen other  than nasopharyngeal swab, presence of viral mutation(s) within the  areas targeted by this assay, and inadequate number of viral copies  (<250 copies / mL). A negative result must be combined with clinical  observations, patient history, and epidemiological information. If  result is POSITIVE SARS-CoV-2 target nucleic acids are DETECTED. The SARS-CoV-2 RNA is generally detectable in upper and lower  respiratory specimens dur ing the acute phase of infection.  Positive  results are indicative of active infection with SARS-CoV-2.  Clinical  correlation with patient history and other diagnostic information is  necessary to determine patient infection status.  Positive results do  not rule out bacterial infection or co-infection with other viruses. If result is PRESUMPTIVE POSTIVE SARS-CoV-2 nucleic acids MAY BE PRESENT.   A presumptive positive result was obtained on the submitted specimen  and confirmed on repeat testing.  While 2019 novel coronavirus  (SARS-CoV-2) nucleic acids may be present in the submitted sample  additional confirmatory testing may be necessary for epidemiological  and / or clinical management purposes  to differentiate between  SARS-CoV-2 and other Sarbecovirus currently known to infect humans.  If clinically indicated additional testing with an alternate test  methodology [phone]) is advised. The SARS-CoV-2 RNA is generally  detectable in upper and lower respiratory sp ecimens during the acute  phase of infection. The expected result is Negative. Fact Sheet for Patients:  StrictlyIdeas.no Fact Sheet for Healthcare Providers: BankingDealers.co.za This test is not yet approved or cleared by the [place] FDA and has been authorized for detection and/or diagnosis of SARS-CoV-2 by FDA under an Emergency Use Authorization (EUA).  This EUA will remain in effect (meaning this test can be used) for the duration of the COVID-19 declaration under Section 564(b)(1) of the Act, 21 U.S.C. section 360bbb-3(b)(1), unless the authorization is terminated or revoked sooner. Performed at [facility], [place] [address], [place], [place] [address]      Labs: BNP (last 3 results) No results  for input(s): BNP in the last 8760 hours. Basic Metabolic Panel: Recent Labs  Lab 10/31/18 1306 11/01/18 0427  NA 137 136  K 3.7 3.7  CL 105 106  CO2 24 23  GLUCOSE 96 95  BUN 8 11  CREATININE 1.26* 1.09  CALCIUM 8.8* 8.5*   Liver Function Tests: Recent Labs  Lab 10/31/18 1306  AST 15  ALT 11  ALKPHOS 44  BILITOT 0.8  PROT 7.4  ALBUMIN 3.2*   No results for input(s): LIPASE, AMYLASE in the last 168 hours. No results for input(s): AMMONIA in the last 168 hours. CBC: Recent Labs  Lab 10/31/18 1306  WBC 5.5  NEUTROABS 1.9  HGB 11.0*  HCT 35.3*  MCV 88.5  PLT 230   Cardiac Enzymes: No  results for input(s): CKTOTAL, CKMB, CKMBINDEX, TROPONINI in the last 168 hours. BNP: Invalid input(s): POCBNP CBG: Recent Labs  Lab 10/31/18 2158 10/31/18 2231 11/01/18 0643 11/01/18 1137  GLUCAP 68* 86 90 102*   D-Dimer No results for input(s): DDIMER in the last 72 hours. Hgb A1c Recent Labs    11/01/18 0427  HGBA1C 5.4   Lipid Profile Recent Labs    11/01/18 0427  CHOL 136  HDL 42  LDLCALC 86  TRIG 42  CHOLHDL 3.2   Thyroid function studies Recent Labs    10/31/18 1306  TSH 0.958   Anemia work up No results for input(s): VITAMINB12, FOLATE, FERRITIN, TIBC, IRON, RETICCTPCT in the last 72 hours. Urinalysis    Component Value Date/Time   COLORURINE YELLOW 05/28/2018 [place] 05/28/2018 1445   LABSPEC 1.016 05/28/2018 1445   PHURINE 5.0 05/28/2018 1445   GLUCOSEU NEGATIVE 05/28/2018 1445   HGBUR SMALL (A) 05/28/2018 1445   HGBUR trace-intact 09/16/2007 1115   BILIRUBINUR NEGATIVE 05/28/2018 1445   KETONESUR NEGATIVE 05/28/2018 1445   PROTEINUR 30 (A) 05/28/2018 1445   UROBILINOGEN 1.0 10/24/2013 1220   NITRITE NEGATIVE 05/28/2018 1445   LEUKOCYTESUR NEGATIVE 05/28/2018 1445   Sepsis Labs Invalid input(s): PROCALCITONIN,  WBC,  LACTICIDVEN Microbiology Recent Results (from the past 240 hour(s))  SARS Coronavirus 2 (CEPHEID -  Performed in [place] hospital lab), Hosp Order     Status: None   Collection Time: 10/31/18  5:54 PM  Result Value Ref Range Status   SARS Coronavirus 2 NEGATIVE NEGATIVE Final    Comment: (NOTE) If result is NEGATIVE SARS-CoV-2 target nucleic acids are NOT DETECTED. The SARS-CoV-2 RNA is generally detectable in upper and lower  respiratory specimens during the acute phase of infection. The lowest  concentration of SARS-CoV-2 viral copies this assay can detect is 250  copies / mL. A negative result does not preclude SARS-CoV-2 infection  and should not be used as the sole basis for treatment or other  patient management decisions.  A negative result may occur with  improper specimen collection / handling, submission of specimen other  than nasopharyngeal swab, presence of viral mutation(s) within the  areas targeted by this assay, and inadequate number of viral copies  (<250 copies / mL). A negative result must be combined with clinical  observations, patient history, and epidemiological information. If result is POSITIVE SARS-CoV-2 target nucleic acids are DETECTED. The SARS-CoV-2 RNA is generally detectable in upper and lower  respiratory specimens dur ing the acute phase of infection.  Positive  results are indicative of active infection with SARS-CoV-2.  Clinical  correlation with patient history and other diagnostic information is  necessary to determine patient infection status.  Positive results do  not rule out bacterial infection or co-infection with other viruses. If result is PRESUMPTIVE POSTIVE SARS-CoV-2 nucleic acids MAY BE PRESENT.   A presumptive positive result was obtained on the submitted specimen  and confirmed on repeat testing.  While 2019 novel coronavirus  (SARS-CoV-2) nucleic acids may be present in the submitted sample  additional confirmatory testing may be necessary for epidemiological  and / or clinical management purposes  to differentiate between   SARS-CoV-2 and other Sarbecovirus currently known to infect humans.  If clinically indicated additional testing with an alternate test  methodology [phone]) is advised. The SARS-CoV-2 RNA is generally  detectable in upper and lower respiratory sp ecimens during the acute  phase of infection. The expected result is Negative.  Fact Sheet for Patients:  StrictlyIdeas.no Fact Sheet for Healthcare Providers: BankingDealers.co.za This test is not yet approved or cleared by the [place] FDA and has been authorized for detection and/or diagnosis of SARS-CoV-2 by FDA under an Emergency Use Authorization (EUA).  This EUA will remain in effect (meaning this test can be used) for the duration of the COVID-19 declaration under Section 564(b)(1) of the Act, 21 U.S.C. section 360bbb-3(b)(1), unless the authorization is terminated or revoked sooner. Performed at [facility], [place] [address] [place], [place] [address]     SIGNED:   Cordelia Poche, MD [facility] 11/01/2018, 4:31 PM

## 2018-11-01 NOTE — Discharge Instructions (Signed)
[name],  You were in the hospital because of a stroke. This appears to have happened in the not too distant past. Your medications have been adjusted and you need to follow-up with the cardiologist for a loop recorder. Information has been provided for follow-up. You will also receive home health services.

## 2018-11-01 NOTE — Evaluation (Signed)
Physical Therapy Evaluation Patient Details Name: [name] MRN: [id number] DOB: Nov 04, 1940 Today's Date: 11/01/2018   History of Present Illness  Patient is a 78 y/o male presenting to the ED on 10/31/2018 with primary complaints of blurry vision. Past medical history significant of type 2 diabetes mellitus, hyperlipidemia, glaucoma, tobacco abuse. MR brain notable for acute versus subacute punctate cortical infarcts occipital lobe.    Clinical Impression  Patient admitted with the above listed diagnosis. Patient reports Mod I with mobility and ADLs prior to admission, with niece available to provide transportation. Patient today requiring min guard for functional mobility with RW. Patient reporting seeing mostly white, however able to make out signs in hallway and navigate obstacles without LOB. Will currently recommend HHPT at discharge. PT to follow acutely.      Follow Up Recommendations Home health PT;Supervision/Assistance - 24 hour    Equipment Recommendations  None recommended by PT    Recommendations for Other Services       Precautions / Restrictions Precautions Precautions: Fall Precaution Comments: reduced vision Restrictions Weight Bearing Restrictions: No      Mobility  Bed Mobility Overal bed mobility: Modified Independent             General bed mobility comments: sit to supine  Transfers Overall transfer level: Needs assistance Equipment used: Rolling walker (2 wheeled) Transfers: Sit to/from [facility] Sit to Stand: Min guard Stand pivot transfers: Min guard       General transfer comment: for safety; flexed posture throughout  Ambulation/Gait Ambulation/Gait assistance: Min guard Gait Distance (Feet): 150 Feet Assistive device: Rolling walker (2 wheeled) Gait Pattern/deviations: Step-through pattern;Decreased stride length;Trunk flexed Gait velocity: decreased   General Gait Details: cueing for navigation; in hallway  able to read room numbers on walls; does not run into any objects or walls with gait; use of tennis shoes - does not have prosthetics  Stairs            Wheelchair Mobility    Modified Rankin (Stroke Patients Only) Modified Rankin (Stroke Patients Only) Pre-Morbid Rankin Score: Slight disability Modified Rankin: Moderately severe disability     Balance Overall balance assessment: Needs assistance Sitting-balance support: No upper extremity supported;Feet supported Sitting balance-Leahy Scale: Fair     Standing balance support: Bilateral upper extremity supported;During functional activity Standing balance-Leahy Scale: Poor Standing balance comment: reliant on RW                             Pertinent Vitals/Pain Pain Assessment: No/denies pain    Home Living Family/patient expects to be discharged to:: Private residence Living Arrangements: Alone Available Help at Discharge: Family;Available PRN/intermittently Type of Home: Apartment Home Access: Level entry     Home Layout: One level Home Equipment: Walker - 2 wheels;Cane - single point      Prior Function Level of Independence: Independent with assistive device(s)         Comments: use of RW vs walking stick; niece helps with driving     Hand Dominance        Extremity/Trunk Assessment   Upper Extremity Assessment Upper Extremity Assessment: Defer to OT evaluation    Lower Extremity Assessment Lower Extremity Assessment: Generalized weakness    Cervical / Trunk Assessment Cervical / Trunk Assessment: Kyphotic  Communication   Communication: No difficulties  Cognition Arousal/Alertness: Awake/alert Behavior During Therapy: WFL for tasks assessed/performed Overall Cognitive Status: Within Functional Limits for tasks assessed  General Comments General comments (skin integrity, edema, etc.): B feet wrapped - patient reports  chronic wounds    Exercises     Assessment/Plan    PT Assessment Patient needs continued PT services  PT Problem List Decreased strength;Decreased activity tolerance;Decreased balance;Decreased mobility;Decreased knowledge of use of DME;Decreased safety awareness       PT Treatment Interventions DME instruction;Gait training;Stair training;Functional mobility training;Therapeutic activities;Therapeutic exercise;Patient/family education;Neuromuscular re-education;Balance training    PT Goals (Current goals can be found in the Care Plan section)  Acute Rehab PT Goals Patient Stated Goal: return home soon PT Goal Formulation: With patient Time For Goal Achievement: 11/15/18 Potential to Achieve Goals: Good    Frequency Min 4X/week   Barriers to discharge        Co-evaluation               AM-PAC PT "6 Clicks" Mobility  Outcome Measure Help needed turning from your back to your side while in a flat bed without using bedrails?: A Little Help needed moving from lying on your back to sitting on the side of a flat bed without using bedrails?: A Little Help needed moving to and from a bed to a chair (including a wheelchair)?: A Little Help needed standing up from a chair using your arms (e.g., wheelchair or bedside chair)?: A Little Help needed to walk in hospital room?: A Little Help needed climbing 3-5 steps with a railing? : A Lot 6 Click Score: 17    End of Session Equipment Utilized During Treatment: Gait belt Activity Tolerance: Patient tolerated treatment well Patient left: in bed;with call bell/phone within reach;with bed alarm set Nurse Communication: Mobility status PT Visit Diagnosis: Unsteadiness on feet (R26.81);Other abnormalities of gait and mobility (R26.89);Muscle weakness (generalized) (M62.81)    Time: 1062-6948 PT Time Calculation (min) (ACUTE ONLY): 19 min   Charges:   PT Evaluation $PT Eval Moderate Complexity: 1 Mod          Lanney Gins, PT, DPT Supplemental Physical Therapist 11/01/18 10:00 AM Pager: [phone] Office: [phone]

## 2018-11-01 NOTE — TOC Transition Note (Signed)
Transition of Care [facility]) - CM/SW Discharge Note   Patient Details  Name: [name] MRN: [id number] Date of Birth: December 09, 1940  Transition of Care [facility]) CM/SW Contact:  Carles Collet, RN Phone Number: 11/01/2018, 4:18 PM   Clinical Narrative:   Sppke w patient, he declined [facility], he states he is active w [facility], Referral placed to [facility], he denies need for additional DME. No other CM needs    Final next level of care: Home w Home Health Services Barriers to Discharge: No Barriers Identified   Patient Goals and CMS Choice Patient states their goals for this hospitalization and ongoing recovery are:: to return home CMS Medicare.gov Compare Post Acute Care list provided to:: Patient Choice offered to / list presented to : Patient  Discharge Placement                       Discharge Plan and Services     Post Acute Care Choice: Resumption of Svcs/PTA Provider                    HH Arranged: PT, OT [place] Agency: [facility] Date [place]: 11/01/18 Time [place]: 1615 Representative spoke with at [place]: [place] ([place]) Interventions     Readmission Risk Interventions Readmission Risk Prevention Plan 11/01/2018  Transportation Screening Complete  PCP or Specialist Appt within 5-7 Days Complete  Home Care Screening Complete  Medication Review (RN CM) Complete  Some recent data might be hidden

## 2018-11-02 ENCOUNTER — Ambulatory Visit: Payer: Self-pay | Admitting: Orthopedic Surgery

## 2018-11-02 LAB — NOVEL CORONAVIRUS, NAA (HOSP ORDER, SEND-OUT TO REF LAB; TAT 18-24 HRS): SARS-CoV-2, NAA: NOT DETECTED

## 2018-11-03 ENCOUNTER — Telehealth: Payer: Self-pay

## 2018-11-03 NOTE — Telephone Encounter (Signed)
Left a message regarding appt on 11/06/18.

## 2018-11-04 NOTE — Telephone Encounter (Signed)
Spoke with pt about appt on 11/06/18. Pt stated he does not have access to a smart device and is not able to check vitals. Pt was advise to keep phone visit. Pt questions were address.

## 2018-11-05 ENCOUNTER — Other Ambulatory Visit: Payer: Self-pay

## 2018-11-05 ENCOUNTER — Ambulatory Visit (INDEPENDENT_AMBULATORY_CARE_PROVIDER_SITE_OTHER): Payer: Medicare Other | Admitting: Orthopedic Surgery

## 2018-11-05 ENCOUNTER — Encounter: Payer: Self-pay | Admitting: Orthopedic Surgery

## 2018-11-05 VITALS — Ht 73.0 in | Wt 177.0 lb

## 2018-11-05 DIAGNOSIS — L97421 Non-pressure chronic ulcer of left heel and midfoot limited to breakdown of skin: Secondary | ICD-10-CM

## 2018-11-05 DIAGNOSIS — L97411 Non-pressure chronic ulcer of right heel and midfoot limited to breakdown of skin: Secondary | ICD-10-CM

## 2018-11-05 NOTE — Progress Notes (Signed)
Office Visit Note   Patient: [name]           Date of Birth: [birth year]           MRN: [id number] Visit Date: 11/05/2018              Requested by: [name], MD [address] [place], [place] [address] PCP: [name], MD  Chief Complaint  Patient presents with  . Left Foot - Follow-up  . Right Foot - Follow-up      HPI: Patient is a 78 year old gentleman who presents in follow-up for bilateral transmetatarsal amputations.  Patient recently had increasing drainage from his wounds cultures were positive for staph he was started on Augmentin he states he still on Augmentin.  Patient states he went to the emergency room last week secondary to blurred vision.  Patient states that his feet are feeling better.  Patient has a Medical illustrator grade 1 ulcer on the left foot worse than the right.  Assessment & Plan: Visit Diagnoses:  1. Midfoot ulceration, left, limited to breakdown of skin ([place])   2. Midfoot skin ulcer, right, limited to breakdown of skin ([place])     Plan: Ulcers were debrided of skin and soft tissue there was good healthy tissue on both feet.  Patient will continue with home health nursing dressing changes we will apply dressing today minimize weightbearing.  Follow-Up Instructions: Return in about 4 weeks (around 12/03/2018).   Ortho Exam  Patient is alert, oriented, no adenopathy, well-dressed, normal affect, normal respiratory effort. Examination patient is ambulating with regular shoes and a cane.  He has increased callus formation of both feet.  There is 3 large Wegner grade 1 ulcers 2 on the left one on the right.  After informed consent a 10 blade knife was used to debride the skin and soft tissue back to healthy viable tissue.  The heel ulcer on the left is 2 cm in diameter 5 mm deep and this shows no signs of infection.  The midfoot ulcer on the left is 3 cm in diameter and 7 cm long with an area of granulation tissue about 5 mm x 3  cm.  The tissue was healthy with good granulation tissue both wounds were debrided of skin and soft tissue with a 10 blade knife peer examination the right foot patient also has a Wegner grade 1 ulcer that is 3 cm in diameter.  After informed consent this was debrided of skin and soft tissue back to healthy viable tissue.  There is no signs of infection in either foot there is no swelling no drainage no cellulitis.  Imaging: No results found. No images are attached to the encounter.  Labs: Lab Results  Component Value Date   HGBA1C 5.4 11/01/2018   HGBA1C 5.2 05/29/2018   HGBA1C 5.4 01/30/2018   ESRSEDRATE 34 (H) 12/17/2017   ESRSEDRATE 45 (H) 09/27/2017   ESRSEDRATE 75 (H) 07/28/2017   CRP 17.1 (H) 12/17/2017   CRP 1.8 07/28/2017   CRP 15.8 (H) 09/06/2014   REPTSTATUS 10/25/2013 FINAL 10/24/2013   CULT NO GROWTH Performed at [facility] 10/24/2013     Lab Results  Component Value Date   ALBUMIN 3.2 (L) 10/31/2018   ALBUMIN 3.5 05/28/2018   ALBUMIN 3.9 09/27/2017    Body mass index is 23.35 kg/m.  Orders:  No orders of the defined types were placed in this encounter.  No orders of the defined types were placed in this  encounter.    Procedures: No procedures performed  Clinical Data: No additional findings.  ROS:  All other systems negative, except as noted in the HPI. Review of Systems  Objective: Vital Signs: Ht 6\' 1"  (1.854 m)   Wt 177 lb (80.3 kg)   BMI 23.35 kg/m   Specialty Comments:  No specialty comments available.  PMFS History: Patient Active Problem List   Diagnosis Date Noted  . Occipital stroke ([place]) 10/31/2018  . DM2 (diabetes mellitus, type 2) ([place]) 10/31/2018  . NSTEMI (non-ST elevated myocardial infarction) ([place]) 05/28/2018  . Syncope 05/28/2018  . HTN (hypertension) 05/28/2018  . Hyperlipemia, mixed 05/28/2018  . Rash and nonspecific skin eruption 12/16/2017  . Pain in right hand 09/11/2017  . Carpal tunnel syndrome,  right upper limb 07/01/2017  . Arthritis of carpometacarpal [facility]) joint of left thumb 12/30/2016  . Midfoot skin ulcer, right, limited to breakdown of skin ([place]) 07/22/2016  . 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[place]) 09/23/2014  . Osteomyelitis of ankle or foot, left, acute ([place]) 08/23/2014  . Cellulitis 10/12/2012  . Chest pain 10/12/2012  . Tobacco abuse 10/12/2012  . ABSCESS, AXILLA, LEFT 02/23/2008  . 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 CHEST WALL PAIN, ACUTE 12/03/2007  . CANDIDIASIS, GLANS PENIS 09/16/2007  . HEADACHE 08/04/2007  . 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 ([place]) 03/05/2007  . Other and unspecified hyperlipidemia 03/05/2007  . GOUT 03/05/2007  . ERECTILE DYSFUNCTION 03/05/2007  . EXTERNAL HEMORRHOIDS 03/05/2007  . VENTRAL HERNIA 03/05/2007  . FATTY LIVER DISEASE 03/05/2007  . Osteoarthritis 03/05/2007  . HEMORRHOIDS, INTERNAL 03/17/2005  . COLONIC POLYPS, HX OF 03/17/2005   Past Medical History:  Diagnosis Date  . Arthritis   . Diabetic neuropathy ([place])   . High cholesterol   . Hypertension    denies  . Osteomyelitis ([facility])    Right great toe  . Peripheral vascular disease ([place])    diabetic  with osteomylitis  . Type II diabetes mellitus ([facility])     Family History  Problem Relation Age of Onset  . Diabetes Mellitus II Other   . Anesthesia problems Neg Hx     Past Surgical History:  Procedure Laterality Date  . AMPUTATION  07/23/2011   Procedure: AMPUTATION DIGIT;  Surgeon: Newt Minion, MD;  Location: [facility];  Service: Orthopedics;  Laterality: Right;  Right Great Toe Amputation  . AMPUTATION Right 09/18/2012   Procedure: Right Foot 2nd Ray Amputation;  Surgeon: Newt Minion, MD;  Location: [facility];  Service: Orthopedics;  Laterality: Right;  Right Foot 2nd Ray Amputation  . AMPUTATION Right 10/14/2012   Procedure: AMPUTATION FOOT;  Surgeon: Newt Minion, MD;  Location: [facility];  Service:  Orthopedics;  Laterality: Right;  Right Foot Transmetatarsal Amputation  . AMPUTATION Left 09/23/2014   Procedure: Left Foot Transmetatarsal Amputation;  Surgeon: Newt Minion, MD;  Location: [facility];  Service: Orthopedics;  Laterality: Left;  . BACK SURGERY     lower  . CARPAL TUNNEL RELEASE Right 01/30/2018   Procedure: RIGHT CARPAL TUNNEL RELEASE;  Surgeon: Newt Minion, MD;  Location: [facility];  Service: Orthopedics;  Laterality: Right;  . CIRCUMCISION  2010  . COLONOSCOPY    . FOOT FRACTURE SURGERY Left 1970's   "broke it playing football" (10/12/2012)  . HUMERUS FRACTURE SURGERY W/ IMPLANT Right 1960's   "put a plate in it" (1/61/0960)  . INCISION AND DRAINAGE OF WOUND Left 2006   "  foot" (10/12/2012)  . LUMBAR [place] SURGERY  2008   Social History   Occupational History  . Not on file  Tobacco Use  . Smoking status: Current Every Day Smoker    Years: 12.00    Types: Pipe  . Smokeless tobacco: Never Used  Substance and Sexual Activity  . Alcohol use: No    Alcohol/week: 0.0 standard drinks  . Drug use: No  . Sexual activity: Not Currently

## 2018-11-06 ENCOUNTER — Telehealth (INDEPENDENT_AMBULATORY_CARE_PROVIDER_SITE_OTHER): Payer: Medicare Other | Admitting: Internal Medicine

## 2018-11-06 ENCOUNTER — Ambulatory Visit: Payer: Self-pay | Admitting: Physician Assistant

## 2018-11-06 ENCOUNTER — Encounter: Payer: Self-pay | Admitting: Internal Medicine

## 2018-11-06 DIAGNOSIS — I639 Cerebral infarction, unspecified: Secondary | ICD-10-CM

## 2018-11-06 NOTE — Progress Notes (Signed)
Thanks for letting me know. We did our best. It is up to the patient. Thanks again for the quick appointment.  - Rowynn Mcweeney

## 2018-11-06 NOTE — Progress Notes (Signed)
Electrophysiology TeleHealth Note   Due to national recommendations of social distancing due to [place] 19, Audio telehealth visit is felt to be most appropriate for this patient at this time.  Verbal consent was obtained from the patient today.  He does not have internet or a smart phone and cannot perform a virtual visit today.   Date:  11/06/2018   ID:  Alejandro Gamel, DOB 1941-03-02, MRN [id number]  Location: home  Provider location: [place] Evaluation Performed: New patient consult  PCP:  [name], MD  Cardiologist:  Candee Furbish, MD  Electrophysiologist:  None   Chief Complaint:  stroke   History of Present Illness:    Yordi Krager is a 78 y.o. male who presents via audio conferencing for a telehealth visit today.   The patient is referred for new consultation regarding cryptogenic stroke by Dr Erlinda Hong.    The patient was found to have stroke on imaging which was felt to likely be cardioembolic.  Workup was negative including telemetry monitoring.  The patient was discharged with instructions to follow-up with me for consideration of ILR.  Today, he denies symptoms of palpitations, chest pain, shortness of breath, orthopnea, PND, lower extremity edema, claudication, dizziness, presyncope, syncope, bleeding, or neurologic sequela. The patient is tolerating medications without difficulties and is otherwise without complaint today.   he denies symptoms of cough, fevers, chills, or new SOB worrisome for COVID 19.   Past Medical History:  Diagnosis Date  . Arthritis   . Diabetic neuropathy ([place])   . High cholesterol   . Hypertension    denies  . Osteomyelitis (HCC)    Right great toe  . Peripheral vascular disease ([place])    diabetic  with osteomylitis  . Type II diabetes mellitus ([place])     Past Surgical History:  Procedure Laterality Date  . AMPUTATION  07/23/2011   Procedure: AMPUTATION DIGIT;  Surgeon: Newt Minion, MD;  Location: [place];  Service:  Orthopedics;  Laterality: Right;  Right Great Toe Amputation  . AMPUTATION Right 09/18/2012   Procedure: Right Foot 2nd Ray Amputation;  Surgeon: Newt Minion, MD;  Location: [place];  Service: Orthopedics;  Laterality: Right;  Right Foot 2nd Ray Amputation  . AMPUTATION Right 10/14/2012   Procedure: AMPUTATION FOOT;  Surgeon: Newt Minion, MD;  Location: [place];  Service: Orthopedics;  Laterality: Right;  Right Foot Transmetatarsal Amputation  . AMPUTATION Left 09/23/2014   Procedure: Left Foot Transmetatarsal Amputation;  Surgeon: Newt Minion, MD;  Location: [place];  Service: Orthopedics;  Laterality: Left;  . BACK SURGERY     lower  . CARPAL TUNNEL RELEASE Right 01/30/2018   Procedure: RIGHT CARPAL TUNNEL RELEASE;  Surgeon: Newt Minion, MD;  Location: [place];  Service: Orthopedics;  Laterality: Right;  . CIRCUMCISION  2010  . COLONOSCOPY    . FOOT FRACTURE SURGERY Left 1970's   "broke it playing football" (10/12/2012)  . HUMERUS FRACTURE SURGERY W/ IMPLANT Right 1960's   "put a plate in it" (8/67/6195)  . INCISION AND DRAINAGE OF WOUND Left 2006   "foot" (10/12/2012)  . LUMBAR [place] SURGERY  2008    Current Outpatient Medications  Medication Sig Dispense Refill  . acetaminophen (TYLENOL) 500 MG tablet Take 1,000 mg by mouth every 6 (six) hours as needed for mild pain.    . ALPHAGAN P 0.1 % SOLN Place 1 drop into both eyes 2 (two) times daily.     Marland Kitchen amoxicillin-clavulanate (  AUGMENTIN) 875-125 MG tablet Take 1 tablet by mouth 2 (two) times daily. 28 tablet 1  . atorvastatin (LIPITOR) 20 MG tablet Take 20 mg by mouth daily.    . clopidogrel (PLAVIX) 75 MG tablet Take 1 tablet (75 mg total) by mouth daily. 30 tablet 0  . latanoprost (XALATAN) 0.005 % ophthalmic solution Place 1 drop into both eyes at bedtime.   6  . metFORMIN (GLUCOPHAGE) 1000 MG tablet Take 1,000 mg by mouth 2 (two) times daily.     Marland Kitchen pyridOXINE (VITAMIN B-6) 50 MG tablet Take 50 mg by mouth 2 (two) times daily.     Marland Kitchen  thiamine (VITAMIN B-1) 100 MG tablet Take 100 mg by mouth daily.    . timolol (TIMOPTIC) 0.5 % ophthalmic solution Place 1 drop into both eyes 2 (two) times daily.  11  . vitamin B-12 (CYANOCOBALAMIN) 500 MCG tablet Take 500 mcg by mouth daily.     No current facility-administered medications for this visit.     Allergies:   Patient has no known allergies.   Social History:  The patient  reports that he has been smoking pipe. He has smoked for the past 12.00 years. He has never used smokeless tobacco. He reports that he does not drink alcohol or use drugs.   Family History:  The patient's  family history includes Diabetes Mellitus II in an other family member.    ROS:  Please see the history of present illness.   All other systems are personally reviewed and negative.    Exam:    Vital Signs:  Ht 6\' 1"  (1.854 m)   Wt 178 lb (80.7 kg)   BMI 23.48 kg/m    Well sounding   Labs/Other Tests and Data Reviewed:    Recent Labs: 05/28/2018: Magnesium 1.8 10/31/2018: ALT 11; Hemoglobin 11.0; Platelets 230; TSH 0.958 11/01/2018: BUN 11; Creatinine, Ser 1.09; Potassium 3.7; Sodium 136   Wt Readings from Last 3 Encounters:  11/06/18 178 lb (80.7 kg)  11/05/18 177 lb (80.3 kg)  10/31/18 177 lb 4 oz (80.4 kg)     Other studies personally reviewed: Additional studies/ records that were reviewed today include: Dr Phoebe Sharps notes,  Records from recent hospital visit  Review of the above records today demonstrates: as above Prior radiographs: MRI and brain CT reviewed    ASSESSMENT & PLAN:    1.  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  I agree with Dr Erlinda Hong that long term monitoring is indicated to better determine the soure of his stroke and to monitor for atrial arrhythmias. The patient states "they told me that I had a stroke but I dont believe them, because I eat well".  I reviewed MRI results with him and have advised long term monitoring.  He states  that he will consider this option and contact my office if he decides to proceed.  Presently, he is thinking that he will not likely proceed.  2. COVID screen The patient does not have any symptoms that suggest any further testing/ screening at this time.  Social distancing reinforced today.  Follow-up:  He will contact my office if he decides to proceed  Current medicines are reviewed at length with the patient today.   The patient does not have concerns regarding his medicines.  The following changes were made today:  none  Labs/ tests ordered today include:  No orders of the defined types were placed in this encounter.  Patient  Risk:  after full review of this patients clinical status, I feel that they are at moderate risk at this time.   Today, I have spent 15 minutes with the patient with telehealth technology discussing stroke .    SignedThompson Grayer MD, [facility] 11/06/2018 12:03 PM   [place] [address] [phone] (office) [phone] (fax)

## 2018-11-11 ENCOUNTER — Telehealth (INDEPENDENT_AMBULATORY_CARE_PROVIDER_SITE_OTHER): Payer: Medicare Other | Admitting: Neurology

## 2018-11-11 ENCOUNTER — Encounter: Payer: Self-pay | Admitting: Neurology

## 2018-11-11 ENCOUNTER — Other Ambulatory Visit: Payer: Self-pay

## 2018-11-11 VITALS — Ht 73.0 in | Wt 185.0 lb

## 2018-11-11 DIAGNOSIS — H539 Unspecified visual disturbance: Secondary | ICD-10-CM

## 2018-11-11 DIAGNOSIS — I639 Cerebral infarction, unspecified: Secondary | ICD-10-CM | POA: Diagnosis not present

## 2018-11-11 NOTE — Progress Notes (Signed)
Virtual Visit via Telephone Note The purpose of this virtual visit is to provide medical care while limiting exposure to the novel coronavirus.    Consent was obtained for phone visit:  Yes.   Answered questions that patient had about telehealth interaction:  Yes.   I discussed the limitations, risks, security and privacy concerns of performing an evaluation and management service by telephone. I also discussed with the patient that there may be a patient responsible charge related to this service. The patient expressed understanding and agreed to proceed.  Pt location: Home Physician Location: office Name of referring provider:  [name],* I connected with .[name] at patients initiation/request on 11/11/2018 at  9:30 AM EDT by telephone and verified that I am speaking with the correct person using two identifiers.  Pt MRN:  [id number] Pt DOB:  Apr 28, 1941   History of Present Illness:  This is a 77 year-old man with CKD, peripheral neuropathy s/p amputation at the midfoot due to osteomyelitis, well-controlled diabetes (hemoglobin A1c 5.4), hypertension, and hyperlipidemia here for evaluation of hospital discharge follow-up for embolic occipital stroke.  Patient presented to the ER on 5/16 with 1 week history of binocular blurred vision.  MRI showed subacute bilateral embolic appearing stroke involving the occipital cortex.  CT angiogram of the head and neck did not show any significant stenosis.  Transthoracic echocardiogram was negative for PFO or left ventricular thrombus.  He was switched from aspirin to Plavix I am recommended to have loop recorder implanted.  This was discussed with Dr. Rayann Heman on virtual visit on 5/22, was not interested in pursuing this.  At this time, he continues to complain of blurred vision involving both eyes.  He denies any vision loss, limb weakness, numbness/tingling of the arms or legs, difficulty swallowing or talking.  Symptoms have been  unchanged since leaving the hospital.  He lives alone and does not drive.   Observations/Objective: Patient is awake and oriented x4.  Speech is not dysarthric.  Patient engages well in conversation with good thought process.   Assessment and Plan:   Embolic occipital stroke, manifesting with vision changes, described as blurred.  Due to limitations of this telephone visit, I am unable to perform detailed neurological examination.  Hospital notes were reviewed and it seems there was some inconsistency with his visual field deficits.  Although the distribution of his occipital stroke can certainly manifest with vision changes, I want to obtain formal visual field testing and complete eye exam to be sure his vision changes are not due to problem intrinsic to the eye, as he has known glaucoma. I agree that a implantable loop recorder would be helpful in detecting any underlying cardiac arrhythmia, however, patient does not wish to proceed with this. Vascular risk factors include diabetes, hyperlipidemia which is well controlled and tobacco use. I stressed the importance of tobacco cessation to minimize cardiovascular disease Continue Plavix 75 mg daily and Lipitor 20 mg daily Continue diabetes management as per primary care doctor Stroke warning signs were discussed with patient   Follow Up Instructions:   I discussed the assessment and treatment plan with the patient. The patient was provided an opportunity to ask questions and all were answered. The patient agreed with the plan and demonstrated an understanding of the instructions.   The patient was advised to call back or seek an in-person evaluation if the symptoms worsen or if the condition fails to improve as anticipated.  Total Time spent in visit with the  patient was:  30 min, of which 100% of the time was spent in counseling and/or coordinating care.   Pt understands and agrees with the plan of care outlined.     Alda Berthold, DO

## 2018-11-14 ENCOUNTER — Telehealth: Payer: Self-pay | Admitting: *Deleted

## 2018-11-14 NOTE — Telephone Encounter (Signed)
I called pt and let him know he may have been potentially exposed to an employee who later tested positive for COVID-19  At [facility]. He declined the free testing offered to him.

## 2018-11-16 ENCOUNTER — Telehealth: Payer: Self-pay

## 2018-11-16 NOTE — Telephone Encounter (Signed)
Call placed to Pt.  Pt does not want a loop recorder.

## 2018-11-19 ENCOUNTER — Telehealth: Payer: Self-pay | Admitting: Orthopedic Surgery

## 2018-11-19 NOTE — Telephone Encounter (Signed)
Patient called stated had toes were amputated and now finding it hard to balance, Bathe and chang clothes. Patient is requesting order for [facility].  Please call patient to advise.  [phone]

## 2018-11-19 NOTE — Telephone Encounter (Signed)
[facility] advised that they will contact the pt. They did go out to see him this week and that they will continue services.

## 2018-11-19 NOTE — Telephone Encounter (Signed)
Mr. [name] called this morning requesting to have home health come to his home to render assistance for ADLs such as bathing.  He states he has a hard time trying to stand in the shower and balance himself.  He feels unsteady on his feet.  Would you check into this for the patient?  Thanks so much

## 2018-11-19 NOTE — Telephone Encounter (Signed)
Message sent to [facility] to ask if the pt has HHN coming to the home. He was supposed to have an aid go out several times a week but it does not sound like anyone has gone to see him. Will hold this message pending advisement.

## 2018-11-20 ENCOUNTER — Telehealth: Payer: Self-pay | Admitting: Orthopedic Surgery

## 2018-11-20 NOTE — Telephone Encounter (Signed)
I called and sw [name] at [facility] care and he states that [facility] came out to see the pt Monday and Thursday and will have a home health aid to see him on monday. They are going to call the pt again and explain this to him. Advised I had also called and sw the pt to advise that they would be contacting him

## 2018-11-20 NOTE — Telephone Encounter (Signed)
Patient called advised of note from previous message. Pt states no one has been to his house at all. He REALLY wants and needs a bath.  Please call patient to advise. [phone]

## 2018-11-21 ENCOUNTER — Emergency Department (HOSPITAL_COMMUNITY): Payer: Medicare Other

## 2018-11-21 ENCOUNTER — Emergency Department (HOSPITAL_COMMUNITY)
Admission: EM | Admit: 2018-11-21 | Discharge: 2018-11-21 | Disposition: A | Discharge status: Home / Self Care | Payer: Medicare Other | Attending: Emergency Medicine | Admitting: Emergency Medicine

## 2018-11-21 ENCOUNTER — Ambulatory Visit (HOSPITAL_COMMUNITY)
Admission: EM | Admit: 2018-11-21 | Discharge: 2018-11-21 | Disposition: A | Discharge status: Home / Self Care | Payer: Medicare Other | Source: Home / Self Care

## 2018-11-21 ENCOUNTER — Encounter (HOSPITAL_COMMUNITY): Payer: Self-pay | Admitting: Emergency Medicine

## 2018-11-21 ENCOUNTER — Other Ambulatory Visit: Payer: Self-pay

## 2018-11-21 DIAGNOSIS — H538 Other visual disturbances: Secondary | ICD-10-CM | POA: Diagnosis present

## 2018-11-21 DIAGNOSIS — I252 Old myocardial infarction: Secondary | ICD-10-CM | POA: Insufficient documentation

## 2018-11-21 DIAGNOSIS — Z79899 Other long term (current) drug therapy: Secondary | ICD-10-CM | POA: Diagnosis not present

## 2018-11-21 DIAGNOSIS — Z7984 Long term (current) use of oral hypoglycemic drugs: Secondary | ICD-10-CM | POA: Insufficient documentation

## 2018-11-21 DIAGNOSIS — F17200 Nicotine dependence, unspecified, uncomplicated: Secondary | ICD-10-CM | POA: Insufficient documentation

## 2018-11-21 DIAGNOSIS — Z89432 Acquired absence of left foot: Secondary | ICD-10-CM | POA: Diagnosis not present

## 2018-11-21 DIAGNOSIS — I10 Essential (primary) hypertension: Secondary | ICD-10-CM | POA: Diagnosis not present

## 2018-11-21 DIAGNOSIS — E1151 Type 2 diabetes mellitus with diabetic peripheral angiopathy without gangrene: Secondary | ICD-10-CM | POA: Insufficient documentation

## 2018-11-21 DIAGNOSIS — Z89431 Acquired absence of right foot: Secondary | ICD-10-CM | POA: Insufficient documentation

## 2018-11-21 DIAGNOSIS — E1140 Type 2 diabetes mellitus with diabetic neuropathy, unspecified: Secondary | ICD-10-CM | POA: Insufficient documentation

## 2018-11-21 LAB — CBG MONITORING, ED: Glucose-Capillary: 94 mg/dL (ref 70–99)

## 2018-11-21 MED ORDER — TETRACAINE HCL 0.5 % OP SOLN
2.0000 [drp] | Freq: Once | OPHTHALMIC | Status: AC
  Administered 2018-11-21: 2 [drp] via OPHTHALMIC
  Filled 2018-11-21: qty 4

## 2018-11-21 MED ORDER — GADOBUTROL 1 MMOL/ML IV SOLN
7.0000 mL | Freq: Once | INTRAVENOUS | Status: AC | PRN
  Administered 2018-11-21: 7 mL via INTRAVENOUS

## 2018-11-21 NOTE — Discharge Instructions (Signed)
You have been evaluated for your vision changes.  Your Brain MRI today is normal.  Please call your eye specialist tomorrow for close follow up.

## 2018-11-21 NOTE — ED Triage Notes (Signed)
Per ot he woke up this morning with a watery right eye and sever blurred vision. Cant see anything but fuzz. No headaches no weakness.

## 2018-11-21 NOTE — ED Triage Notes (Signed)
Pt states he woke up this morning with severe blurred vision in right eye (normally his good eye). Pt has hx of diabetes, no injury to eye. No neuro component involved.

## 2018-11-21 NOTE — ED Notes (Signed)
Tono pin and tetracaine at bedside

## 2018-11-21 NOTE — ED Provider Notes (Signed)
[facility] EMERGENCY DEPARTMENT Provider Note   CSN: [id number] Arrival date & time: 11/21/18  1236    History   Chief Complaint Chief Complaint  Patient presents with  . Blurred Vision    HPI [name] is a 78 y.o. male.     HPI Patient presents with blurred vision in his right eye.  States he woke up with this morning.  States there is some burning in the eye.  Has had recent admission the hospital with posterior strokes and double vision.  States he normally does not have blurred vision like this.  No headache.  No confusion.  No numbness or weakness. Past Medical History:  Diagnosis Date  . Arthritis   . Diabetic neuropathy ([place])   . High cholesterol   . Hypertension    denies  . Osteomyelitis (HCC)    Right great toe  . Peripheral vascular disease ([place])    diabetic  with osteomylitis  . Stroke (cerebrum) ([place]) 10/2018  . Type II diabetes mellitus [facility])     Patient Active Problem List   Diagnosis Date Noted  . Occipital stroke ([place]) 10/31/2018  . DM2 (diabetes mellitus, type 2) ([place]) 10/31/2018  . NSTEMI (non-ST elevated myocardial infarction) ([place]) 05/28/2018  . Syncope 05/28/2018  . HTN (hypertension) 05/28/2018  . Hyperlipemia, mixed 05/28/2018  . Rash and nonspecific skin eruption 12/16/2017  . Pain in right hand 09/11/2017  . Carpal tunnel syndrome, right upper limb 07/01/2017  . Arthritis of carpometacarpal [facility]) joint of left thumb 12/30/2016  . Midfoot skin ulcer, right, limited to breakdown of skin ([place]) 07/22/2016  . 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 Osteomyelitis of ankle or foot, left, acute ([place]) 08/23/2014  . Cellulitis 10/12/2012  . Chest pain 10/12/2012  . Tobacco abuse 10/12/2012  . ABSCESS, AXILLA, LEFT 02/23/2008  . 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 CHEST WALL PAIN, ACUTE 12/03/2007  . CANDIDIASIS, GLANS PENIS 09/16/2007  . HEADACHE 08/04/2007  . 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 Diabetes  mellitus type 2 with atherosclerosis of arteries of extremities ([place]) 03/05/2007  . Other and unspecified hyperlipidemia 03/05/2007  . GOUT 03/05/2007  . ERECTILE DYSFUNCTION 03/05/2007  . EXTERNAL HEMORRHOIDS 03/05/2007  . VENTRAL HERNIA 03/05/2007  . FATTY LIVER DISEASE 03/05/2007  . Osteoarthritis 03/05/2007  . HEMORRHOIDS, INTERNAL 03/17/2005  . COLONIC POLYPS, HX OF 03/17/2005    Past Surgical History:  Procedure Laterality Date  . AMPUTATION  07/23/2011   Procedure: AMPUTATION DIGIT;  Surgeon: Newt Minion, MD;  Location: [place];  Service: Orthopedics;  Laterality: Right;  Right Great Toe Amputation  . AMPUTATION Right 09/18/2012   Procedure: Right Foot 2nd Ray Amputation;  Surgeon: Newt Minion, MD;  Location: [place];  Service: Orthopedics;  Laterality: Right;  Right Foot 2nd Ray Amputation  . AMPUTATION Right 10/14/2012   Procedure: AMPUTATION FOOT;  Surgeon: Newt Minion, MD;  Location: [place];  Service: Orthopedics;  Laterality: Right;  Right Foot Transmetatarsal Amputation  . AMPUTATION Left 09/23/2014   Procedure: Left Foot Transmetatarsal Amputation;  Surgeon: Newt Minion, MD;  Location: [place];  Service: Orthopedics;  Laterality: Left;  . BACK SURGERY     lower  . CARPAL TUNNEL RELEASE Right 01/30/2018   Procedure: RIGHT CARPAL TUNNEL RELEASE;  Surgeon: Newt Minion, MD;  Location: [place];  Service: Orthopedics;  Laterality: Right;  . CIRCUMCISION  2010  . COLONOSCOPY    . FOOT FRACTURE SURGERY Left 1970's   "  broke it playing football" (10/12/2012)  . HUMERUS FRACTURE SURGERY W/ IMPLANT Right 1960's   "put a plate in it" (02/08/538)  . INCISION AND DRAINAGE OF WOUND Left 2006   "foot" (10/12/2012)  . [place] SURGERY  2008        Home Medications    Prior to Admission medications   Medication Sig Start Date End Date Taking? Authorizing Provider  acetaminophen (TYLENOL) 500 MG tablet Take 1,000 mg by mouth every 6 (six) hours as needed for mild pain.    [provider]  ALPHAGAN P 0.1 % SOLN Place 1 drop into both eyes 2 (two) times daily.  12/31/16   [provider]  atorvastatin (LIPITOR) 20 MG tablet Take 20 mg by mouth daily. 08/31/18   [provider]  clopidogrel (PLAVIX) 75 MG tablet Take 1 tablet (75 mg total) by mouth daily. 11/01/18   Mariel Aloe, MD  latanoprost (XALATAN) 0.005 % ophthalmic solution Place 1 drop into both eyes at bedtime.  09/12/14   [provider]  metFORMIN (GLUCOPHAGE) 1000 MG tablet Take 1,000 mg by mouth 2 (two) times daily.     [provider]  pyridOXINE (VITAMIN B-6) 50 MG tablet Take 50 mg by mouth daily.     [provider]  thiamine (VITAMIN B-1) 100 MG tablet Take 100 mg by mouth daily.    [provider]  timolol (TIMOPTIC) 0.5 % ophthalmic solution Place 1 drop into both eyes 2 (two) times daily. 05/19/17   [provider]  vitamin B-12 (CYANOCOBALAMIN) 500 MCG tablet Take 500 mcg by mouth daily.    [provider]    Family History Family History  Problem Relation Age of Onset  . Diabetes Mellitus II Other   . Anesthesia problems Neg Hx     Social History Social History   Tobacco Use  . Smoking status: Current Every Day Smoker    Years: 12.00    Types: Pipe  . Smokeless tobacco: Never Used  Substance Use Topics  . Alcohol use: No    Alcohol/week: 0.0 standard drinks  . Drug use: No     Allergies   Patient has no known allergies.   Review of Systems Review of Systems  Constitutional: Negative for appetite change.  HENT: Negative for congestion.   Eyes: Positive for visual disturbance.  Respiratory: Negative for shortness of breath.   Gastrointestinal: Negative for abdominal pain.  Genitourinary: Negative for flank pain.  Musculoskeletal: Negative for back pain.  Neurological: Negative for weakness and light-headedness.  Psychiatric/Behavioral: Negative for behavioral problems.     Physical Exam Updated  Vital Signs BP (!) 151/74   Pulse (!) 49   Temp 98.4 F (36.9 C) (Oral)   Resp 12   SpO2 100%   Physical Exam Vitals signs and nursing note reviewed.  Constitutional:      Appearance: Normal appearance.  HENT:     Head: Normocephalic.  Eyes:     Extraocular Movements: Extraocular movements intact.     Comments: Visual fields grossly intact bilaterally by confrontation.  Eye movements intact.  Pupils appear grossly reactive although may be less reactive on left.  Neck:     Musculoskeletal: Neck supple.  Cardiovascular:     Rate and Rhythm: Regular rhythm.  Pulmonary:     Effort: Pulmonary effort is normal.  Abdominal:     Palpations: Abdomen is soft.     Tenderness: There is no abdominal tenderness.  Skin:  General: Skin is warm.      ED Treatments / Results  Labs (all labs ordered are listed, but only abnormal results are displayed) Labs Reviewed  CBG MONITORING, ED    EKG None  Radiology No results found.  Procedures Procedures (including critical care time)  Medications Ordered in ED Medications  tetracaine (PONTOCAINE) 0.5 % ophthalmic solution 2 drop (2 drops Left Eye Given 11/21/18 1350)  gadobutrol (GADAVIST) 1 MMOL/ML injection 7 mL (7 mLs Intravenous Contrast Given 11/21/18 1844)     Initial Impression / Assessment and Plan / ED Course  I have reviewed the triage vital signs and the nursing notes.  Pertinent labs & imaging results that were available during my care of the patient were reviewed by me and considered in my medical decision making (see chart for details).        + Patient with vision change.  Recent posterior strokes.  Somewhat inconsistent exam.  Patient states he is hardly able to see anything but has vision acuity similar on both sides.  MRI pending.  Care turned over to [facility].  May need ophthalmology consult if MRI does not show cause.  Final Clinical Impressions(s) / ED Diagnoses   Final diagnoses:  None    ED  Discharge Orders    None       Davonna Belling, MD 11/21/18 1905

## 2018-11-21 NOTE — ED Provider Notes (Signed)
Received sign out from Dr. Alvino Chapel.  Pt here with visual changes.  He has had recent stroke.  Currently awaits brain MRI.  If negative, consult ophthalmology for f/u.  If positive, consult neuro for potential medication adjustment and likely discharge.   7:34 PM Patient report blurry vision of right eye which started this morning.  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  Brain MRI today show no acute finding.  On exam, pupils are grossly reactive, visual fields intact and eye movement is intact.  Plan to consult ophthalmology for recommendation.  7:43 PM I did consult ophthalmologist Dr. Coralyn [name] who agrees to see pt out pt for further evaluation.  Looking through pt's note, he does have ophthalmologist at [facility] in [place].   7:49 PM Pt sts he was last cared for by eye specialist Dr. Alanda [name] 2 months ago.  I encourage pt to call and follow up closely with Dr. Alanda [name] STAT for further care.   BP (!) 151/74   Pulse (!) 49   Temp 98.4 F (36.9 C) (Oral)   Resp 12   SpO2 100%   Results for orders placed or performed during the hospital encounter of 11/21/18  CBG monitoring, ED  Result Value Ref Range   Glucose-Capillary 94 70 - 99 mg/dL   Ct Angio Head W Or [name] Contrast  Result Date: 10/31/2018 CLINICAL DATA:  Stroke EXAM: CT ANGIOGRAPHY HEAD AND NECK TECHNIQUE: Multidetector CT imaging of the head and neck was performed using the standard protocol during bolus administration of intravenous contrast. Multiplanar CT image reconstructions and MIPs were obtained to evaluate the vascular anatomy. Carotid stenosis measurements (when applicable) are obtained utilizing NASCET criteria, using the distal internal carotid diameter as the denominator. CONTRAST:  164m OMNIPAQUE IOHEXOL 350 MG/ML SOLN COMPARISON:  MRI head 10/31/2018 FINDINGS: CT HEAD FINDINGS Brain: Generalized atrophy. Small chronic infarct right occipital lobe. No acute infarct. Negative for hemorrhage or  mass. Vascular: Negative for hyperdense vessel Skull: Diffuse calvarial thickening without acute abnormality. Sinuses: Negative Orbits: Bilateral ocular valves.  No orbital mass. Review of the MIP images confirms the above findings CTA NECK FINDINGS Aortic arch: Standard branching. Imaged portion shows no evidence of aneurysm or dissection. No significant stenosis of the major arch vessel origins. Right carotid system: Mild atherosclerotic calcification right carotid bulb. No significant carotid stenosis. Left carotid system: Left carotid widely patent without stenosis or atherosclerotic disease Vertebral arteries: Both vertebral arteries widely patent without stenosis Skeleton: Cervical disc degeneration and spurring. No acute skeletal abnormality. 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 Mild stenosis of the supraclinoid internal carotid artery bilaterally. Anterior and middle cerebral arteries patent without significant stenosis bilaterally. Posterior circulation: Both vertebral arteries patent to the basilar. PICA patent bilaterally. AICA, superior cerebellar, and posterior cerebral arteries patent bilaterally. Mild irregularity of the posterior cerebral artery bilaterally without significant stenosis. Venous sinuses: Normal enhancement of the venous sinuses. Considerable dural calcification is seen around the sagittal sinus which likely accounts for the signal changes on MRI. Anatomic variants: None Delayed phase: Normal enhancement of the brain. Review of the MIP images confirms the above findings IMPRESSION: 1. No acute intracranial abnormality. 2. Negative for large vessel occlusion. 3. No significant intracranial or extracranial stenosis. Mild stenosis of the supraclinoid internal carotid artery and mild irregularity of the posterior cerebral arteries bilaterally.  4. Normal dural sinus enhancement without evidence  of thrombosis. Electronically Signed   By: Franchot Gallo M.D.   On: 10/31/2018 19:41   Dg Chest 2 View  Result Date: 11/01/2018 CLINICAL DATA:  CVA EXAM: CHEST - 2 VIEW COMPARISON:  05/28/2018 FINDINGS: Lungs are clear.  No pleural effusion or pneumothorax. The heart is normal in size. Degenerative changes of the thoracic spine. IMPRESSION: Normal chest radiographs. Electronically Signed   By: Julian Hy M.D.   On: 11/01/2018 06:21   Ct Angio Neck W Or [name] Contrast  Result Date: 10/31/2018 CLINICAL DATA:  Stroke EXAM: CT ANGIOGRAPHY HEAD AND NECK TECHNIQUE: Multidetector CT imaging of the head and neck was performed using the standard protocol during bolus administration of intravenous contrast. Multiplanar CT image reconstructions and MIPs were obtained to evaluate the vascular anatomy. Carotid stenosis measurements (when applicable) are obtained utilizing NASCET criteria, using the distal internal carotid diameter as the denominator. CONTRAST:  138m OMNIPAQUE IOHEXOL 350 MG/ML SOLN COMPARISON:  MRI head 10/31/2018 FINDINGS: CT HEAD FINDINGS Brain: Generalized atrophy. Small chronic infarct right occipital lobe. No acute infarct. Negative for hemorrhage or mass. Vascular: Negative for hyperdense vessel Skull: Diffuse calvarial thickening without acute abnormality. Sinuses: Negative Orbits: Bilateral ocular valves.  No orbital mass. Review of the MIP images confirms the above findings CTA NECK FINDINGS Aortic arch: Standard branching. Imaged portion shows no evidence of aneurysm or dissection. No significant stenosis of the major arch vessel origins. Right carotid system: Mild atherosclerotic calcification right carotid bulb. No significant carotid stenosis. Left carotid system: Left carotid widely patent without stenosis or atherosclerotic disease Vertebral arteries: Both vertebral arteries widely patent without stenosis Skeleton: Cervical disc  degeneration and spurring. No acute skeletal abnormality. 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 Mild stenosis of the supraclinoid internal carotid artery bilaterally. Anterior and middle cerebral arteries patent without significant stenosis bilaterally. Posterior circulation: Both vertebral arteries patent to the basilar. PICA patent bilaterally. AICA, superior cerebellar, and posterior cerebral arteries patent bilaterally. Mild irregularity of the posterior cerebral artery bilaterally without significant stenosis. Venous sinuses: Normal enhancement of the venous sinuses. Considerable dural calcification is seen around the sagittal sinus which likely accounts for the signal changes on MRI. Anatomic variants: None Delayed phase: Normal enhancement of the brain. Review of the MIP images confirms the above findings IMPRESSION: 1. No acute intracranial abnormality. 2. Negative for large vessel occlusion. 3. No significant intracranial or extracranial stenosis. Mild stenosis of the supraclinoid internal carotid artery and mild irregularity of the posterior cerebral arteries bilaterally. 4. Normal dural sinus enhancement without evidence of thrombosis. Electronically Signed   By: CFranchot GalloM.D.   On: 10/31/2018 19:41   [name] Contrast  Result Date: 11/21/2018 CLINICAL DATA:  Neuro deficits, subacute.  Left eye vision loss. EXAM: MRI HEAD WITHOUT AND WITH CONTRAST TECHNIQUE: Multiplanar, multiecho pulse sequences of the brain and surrounding structures were obtained without and with intravenous contrast. CONTRAST:  7 mL Gadavist COMPARISON:  CT head without contrast and CTA head and neck 10/31/2018. MRI of the brain 10/31/2018. FINDINGS: Brain: Signal on diffusion trace sequence in the left occipital lobe is again noted, nearly identical to the prior study.  There is no progression. Although the patient is having visual symptoms, this is likely artifactual. No other acute or subacute infarct is present. Atrophy and white matter changes are stable. The ventricles are proportionate to the degree of  atrophy. No significant extraaxial fluid collection is present. The internal auditory canals are within normal limits. The brainstem and cerebellum are within normal limits. Postcontrast images demonstrate no pathologic enhancement. Vascular: Flow is present in the major intracranial arteries. Skull and upper cervical spine: Degenerative changes are present in the upper cervical spine. Craniocervical junction is otherwise normal. Hyperostosis is again noted. Calvarium is otherwise within normal limits. Sinuses/Orbits: The paranasal sinuses and mastoid air cells are clear. Bilateral lens replacements are noted. Globes and orbits are otherwise unremarkable. IMPRESSION: 1. No acute intracranial abnormality or significant interval change. 2. Stable atrophy and white matter disease. 3. Mild degenerative changes in the upper cervical spine. Electronically Signed   By: San Morelle M.D.   On: 11/21/2018 19:09   [name] Contrast  Result Date: 10/31/2018 CLINICAL DATA:  Progressive worsening vision. Vision has acutely worsened over the last week with associated diplopia. EXAM: MRI HEAD WITHOUT AND WITH CONTRAST TECHNIQUE: Multiplanar, multiecho pulse sequences of the brain and surrounding structures were obtained without and with intravenous contrast. CONTRAST:  9 mL Gadavist COMPARISON:  CT head without contrast 05/28/2018. MRI brain 06/24/2017 FINDINGS: Brain: Punctate diffusion signal abnormality is present in the posterior occipital lobes bilaterally on coronal image 45 of series 7 and axial image 80 of series 5. There is some T2 signal change within the occipital lobes bilaterally as well. This raises concern for acute/subacute ischemia. No other acute or  focal infarct is present. Mild generalized atrophy and white matter disease is otherwise within normal limits for age. The ventricles are proportionate to the degree of atrophy. Basal ganglia are intact. The internal auditory canals are within normal limits. The brainstem and cerebellum are within normal limits. The postcontrast images demonstrate no pathologic enhancement. Vascular: Flow is present in the major intracranial arteries. There is some signal irregularity within the superior sagittal sinus above the torcular. Skull and upper cervical spine: Craniocervical junction is normal. Mild degenerative changes are noted at C2-3 and C3-4. Upper cervical spine is within normal limits otherwise. Sinuses/Orbits: Minimal fluid is present in the left mastoid air cells. The paranasal sinuses and mastoid air cells are otherwise clear. Bilateral lens replacements are noted. Globes and orbits are otherwise unremarkable. IMPRESSION: 1. T2 signal change in some diffusion abnormality within the occipital lobes bilaterally concerning for acute/subacute punctate cortical infarcts of the occipital lobe. This corresponds with visual abnormalities. 2. Signal abnormality in the posterosuperior sagittal sinus may be artifactual. There does appear to be contrast within the superior sagittal sinus. Casey venogram would be useful for further evaluation of sinus stenosis or thrombosis as an etiology of the occipital lobe disease. 3. Moderate atrophy and white matter disease is otherwise within normal limits for age. 4. Degenerative changes within the cervical spine. Electronically Signed   By: San Morelle M.D.   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  There is a skin ulcer present  over the central plantar tar skin but this does not appear to track to bone on today's radiographs.  Xr Foot Complete Right  Result Date: 10/28/2018 Radiographs of the right foot status post trans-met amputation and skin ulceration with tracking into deeper tissue however it does not appear to track completely to bone.  There may be bony resorption versus possible early  osteomyelitis over the medial metatarsal.  Vas [place] Lower Extremity Venous (dvt)  Result Date: 11/02/2018  Lower Venous Study Indications: Stroke.  Limitations: Body habitus. Performing Technologist: Maudry Mayhew MHA, RDMS, RVT, RDCS  Examination Guidelines: A complete evaluation includes B-mode imaging, spectral Doppler, color Doppler, and power Doppler as needed of all accessible portions of each vessel. Bilateral testing is considered an integral part of a complete examination. Limited examinations for reoccurring indications may be performed as noted.  +---------+---------------+---------+-----------+----------+-------+ RIGHT    CompressibilityPhasicitySpontaneityPropertiesSummary +---------+---------------+---------+-----------+----------+-------+ CFV      Full           Yes      Yes                          +---------+---------------+---------+-----------+----------+-------+ SFJ      Full                                                 +---------+---------------+---------+-----------+----------+-------+ FV Prox  Full                                                 +---------+---------------+---------+-----------+----------+-------+ FV Mid   Full                                                 +---------+---------------+---------+-----------+----------+-------+ FV DistalFull                                                 +---------+---------------+---------+-----------+----------+-------+ PFV      Full                                                  +---------+---------------+---------+-----------+----------+-------+ POP      Full           Yes      Yes                          +---------+---------------+---------+-----------+----------+-------+ PTV      Full                                                 +---------+---------------+---------+-----------+----------+-------+ PERO     Full                                                 +---------+---------------+---------+-----------+----------+-------+   +---------+---------------+---------+-----------+----------+------------------+ LEFT     CompressibilityPhasicitySpontaneityPropertiesSummary            +---------+---------------+---------+-----------+----------+------------------+ CFV      Full  Yes      Yes                                     +---------+---------------+---------+-----------+----------+------------------+ SFJ      Full                                                            +---------+---------------+---------+-----------+----------+------------------+ FV Prox  Full                                                            +---------+---------------+---------+-----------+----------+------------------+ FV Mid   Full                                                            +---------+---------------+---------+-----------+----------+------------------+ FV DistalFull                                                            +---------+---------------+---------+-----------+----------+------------------+ PFV      Full                                                            +---------+---------------+---------+-----------+----------+------------------+ POP      Full           Yes      Yes                                     +---------+---------------+---------+-----------+----------+------------------+ PTV                              Yes                  patent by color                                                           Doppler            +---------+---------------+---------+-----------+----------+------------------+ PERO                             Yes                  patent  by color                                                          Doppler            +---------+---------------+---------+-----------+----------+------------------+     Summary: Right: There is no evidence of deep vein thrombosis in the lower extremity. No cystic structure found in the popliteal fossa. Ultrasound characteristics of enlarged lymph nodes are noted in the groin. Left: There is no evidence of deep vein thrombosis in the lower extremity. No cystic structure found in the popliteal fossa.  *See table(s) above for measurements and observations. Electronically signed by Harold Barban MD on 11/02/2018 at 9:10:59 AM.    Final       Domenic Moras, PA-C 11/21/18 Patrecia Pace, MD 12/01/18 [id number]

## 2018-11-21 NOTE — ED Notes (Signed)
Patient verbalizes understanding of discharge instructions. Opportunity for questioning and answers were provided. Armband removed by staff, pt discharged from ED by wheelchair   

## 2018-11-21 NOTE — ED Notes (Signed)
Pt able to see the same peripherally

## 2018-11-21 NOTE — ED Notes (Signed)
Patient transported to MRI 

## 2018-11-21 NOTE — ED Notes (Signed)
Case discussed between dr Joseph Art and angela, rn.  Patient to go to ED

## 2018-11-21 NOTE — ED Notes (Signed)
Offered to update patient family, pt declined at this time

## 2018-12-01 ENCOUNTER — Telehealth: Payer: Self-pay | Admitting: Orthopedic Surgery

## 2018-12-01 NOTE — Telephone Encounter (Signed)
Nurse was given okay for VO; LVM

## 2018-12-01 NOTE — Telephone Encounter (Signed)
April-nurse with [facility] called needing verbal orders for skilled nursing 2 Wk 8 and 1 Wk 1, HH Aid 1 Wk 8   The number to contact April is [phone]

## 2018-12-03 ENCOUNTER — Ambulatory Visit: Payer: Medicare Other | Admitting: Orthopedic Surgery

## 2018-12-21 ENCOUNTER — Encounter (INDEPENDENT_AMBULATORY_CARE_PROVIDER_SITE_OTHER): Payer: Self-pay | Admitting: Ophthalmology

## 2018-12-21 ENCOUNTER — Ambulatory Visit (INDEPENDENT_AMBULATORY_CARE_PROVIDER_SITE_OTHER): Payer: Medicare HMO | Admitting: Ophthalmology

## 2018-12-21 ENCOUNTER — Other Ambulatory Visit: Payer: Self-pay

## 2018-12-21 DIAGNOSIS — E119 Type 2 diabetes mellitus without complications: Secondary | ICD-10-CM

## 2018-12-21 DIAGNOSIS — Z961 Presence of intraocular lens: Secondary | ICD-10-CM | POA: Diagnosis not present

## 2018-12-21 DIAGNOSIS — I10 Essential (primary) hypertension: Secondary | ICD-10-CM

## 2018-12-21 DIAGNOSIS — H35033 Hypertensive retinopathy, bilateral: Secondary | ICD-10-CM

## 2018-12-21 DIAGNOSIS — H401130 Primary open-angle glaucoma, bilateral, stage unspecified: Secondary | ICD-10-CM | POA: Diagnosis not present

## 2018-12-21 DIAGNOSIS — H353130 Nonexudative age-related macular degeneration, bilateral, stage unspecified: Secondary | ICD-10-CM | POA: Diagnosis not present

## 2018-12-21 DIAGNOSIS — H401133 Primary open-angle glaucoma, bilateral, severe stage: Secondary | ICD-10-CM

## 2018-12-21 DIAGNOSIS — H3581 Retinal edema: Secondary | ICD-10-CM | POA: Diagnosis not present

## 2018-12-21 DIAGNOSIS — H547 Unspecified visual loss: Secondary | ICD-10-CM | POA: Diagnosis not present

## 2018-12-21 NOTE — Progress Notes (Signed)
[facility] Note  12/21/2018     CHIEF COMPLAINT Patient presents for Retina Evaluation   HISTORY OF PRESENT ILLNESS: [name] is a 78 y.o. male who presents to the clinic today for:   HPI    Retina Evaluation    I, the attending physician,  performed the HPI with the patient and updated documentation appropriately.          Comments    BS: 82 yesterday Last HgA1c: 5.4 Patient states his vision became very poor in his right eye about a year ago and states he lost vision OS this morning.  Patient states he had cataract surgery 3-4 yrs ago with Dr. Katy Fitch (OU).  Patient states there were complications at the time of cataract surgery OS.   Patient states the only drops he is using at this time is Artificial tears.  Patient complains of burning and stinging in both eyes.  Patient has occasional floaters OU and occasional flashes of light OU.       Last edited by Bernarda Caffey, MD on 12/21/2018  1:02 PM. (History)    pt states he saw Dr. Gershon Crane this morning, he states this was his first time seeing him, he states he used to see Dr. Clent Jacks, but he states Dr. Katy Fitch "messed up" his eye during cataract sx, he states different drs have put him on drops for glaucoma, but he is not currently taking any drops in either eye, pt states he vision his white in both eyes now, but he can see "pretty good" out of his right eye, pt states a dr in [place] did glaucoma sx in both eyes  Referring physician: Rutherford Guys, MD [place],  [place] [address]  HISTORICAL INFORMATION:   Selected notes from the [facility] Referred by Dr. Rutherford Guys for concern of vision loss v [facility] LEE: 07.06.20 [name])  Ocular Hx- Long history of glaucoma management at [facility] (Dr. Delsa Sale) Last visit w/ Dr. Delsa Sale was 2.10.2020: Pt is a poor historian & has poor insight into his glaucoma   POAG End Stage OU -S/p trab OD ~summer 2016, possible trab OS? Pt  uncertain but trabs failed OU -s/p SLT OU 04/15/16 -s/p Ahmad FP7/SPG OD (09/17/2016) s/p BV350/SPG OS (07/22/2016) -s/p ripcord pulled (08/18/2017) -no FH -max IOP unknown -thin pachys right eye 483, 546 OS -OCT with severe thinning OU (12/2015) -Macular OCT with edema OD, thinning OS (12/2015) -HVF generalized depression reproduced OU with small remaining island of vision on 10-2 (02/2016) -no color vision OU - had previous non-glaucomatous optic neuropathy? -want IOP goal in low teens OU -pt thought drops are suppose to improve vision, and stopped using gtt when he saw no improvement -- emphasized that gtts do not improve vision but to retain what is left  -due to confusion, simplify drop regimen by making all drops OU -IOP OD was not at goal of low teens on latanoprost OU, timolol BID OU, Brimonidine TID OU. -with addition of rhopressa OD pt stopped latan OU -Restart latanoprost qHS OU -reviewed all gtts with pt again -rtc 2 months to check IOP  PMH-   CURRENT MEDICATIONS: Current Outpatient Medications (Ophthalmic Drugs)  Medication Sig  . ALPHAGAN P 0.1 % SOLN Place 1 drop into both eyes 2 (two) times daily.   Marland Kitchen latanoprost (XALATAN) 0.005 % ophthalmic solution Place 1 drop into both eyes at bedtime.   . timolol (TIMOPTIC) 0.5 % ophthalmic solution  Place 1 drop into both eyes 2 (two) times daily.   No current facility-administered medications for this visit.  (Ophthalmic Drugs)   Current Outpatient Medications (Other)  Medication Sig  . metFORMIN (GLUCOPHAGE) 1000 MG tablet Take 1,000 mg by mouth 2 (two) times daily.   . vitamin B-12 (CYANOCOBALAMIN) 500 MCG tablet Take 500 mcg by mouth daily.  Marland Kitchen acetaminophen (TYLENOL) 500 MG tablet Take 1,000 mg by mouth every 6 (six) hours as needed for mild pain.  Marland Kitchen atorvastatin (LIPITOR) 20 MG tablet Take 20 mg by mouth daily.  . clopidogrel (PLAVIX) 75 MG tablet Take 1 tablet (75 mg total) by mouth daily. (Patient not taking: Reported on  12/21/2018)  . pyridOXINE (VITAMIN B-6) 50 MG tablet Take 50 mg by mouth daily.   Marland Kitchen thiamine (VITAMIN B-1) 100 MG tablet Take 100 mg by mouth daily.   No current facility-administered medications for this visit.  (Other)      REVIEW OF SYSTEMS: ROS    Positive for: Eyes   Negative for: Constitutional, Gastrointestinal, Neurological, Skin, Genitourinary, Musculoskeletal, HENT, Endocrine, Cardiovascular, Respiratory, Psychiatric, Allergic/Imm, Heme/Lymph   Last edited by Doneen Poisson on 12/21/2018 12:39 PM. (History)       ALLERGIES No Known Allergies  PAST MEDICAL HISTORY Past Medical History:  Diagnosis Date  . Arthritis   . Diabetic neuropathy ([place])   . High cholesterol   . Hypertension    denies  . Osteomyelitis (HCC)    Right great toe  . Peripheral vascular disease ([place])    diabetic  with osteomylitis  . 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 Type II diabetes mellitus ([place])    Past Surgical History:  Procedure Laterality Date  . AMPUTATION  07/23/2011   Procedure: AMPUTATION DIGIT;  Surgeon: Newt Minion, MD;  Location: [facility];  Service: Orthopedics;  Laterality: Right;  Right Great Toe Amputation  . AMPUTATION Right 09/18/2012   Procedure: Right Foot 2nd Ray Amputation;  Surgeon: Newt Minion, MD;  Location: [facility];  Service: Orthopedics;  Laterality: Right;  Right Foot 2nd Ray Amputation  . AMPUTATION Right 10/14/2012   Procedure: AMPUTATION FOOT;  Surgeon: Newt Minion, MD;  Location: [facility];  Service: Orthopedics;  Laterality: Right;  Right Foot Transmetatarsal Amputation  . AMPUTATION Left 09/23/2014   Procedure: Left Foot Transmetatarsal Amputation;  Surgeon: Newt Minion, MD;  Location: [facility];  Service: Orthopedics;  Laterality: Left;  . BACK SURGERY     lower  . CARPAL TUNNEL RELEASE Right 01/30/2018   Procedure: RIGHT CARPAL TUNNEL RELEASE;  Surgeon: Newt Minion, MD;  Location: [facility];  Service: Orthopedics;  Laterality: Right;  . CIRCUMCISION  2010  .  COLONOSCOPY    . FOOT FRACTURE SURGERY Left 1970's   "broke it playing football" (10/12/2012)  . HUMERUS FRACTURE SURGERY W/ IMPLANT Right 1960's   "put a plate in it" (10/20/3974)  . INCISION AND DRAINAGE OF WOUND Left 2006   "foot" (10/12/2012)  . LUMBAR DISC SURGERY  2008    FAMILY HISTORY Family History  Problem Relation Age of Onset  . Diabetes Mellitus II Other   . Anesthesia problems Neg Hx     SOCIAL HISTORY Social History   Tobacco Use  . Smoking status: Current Every Day Smoker    Years: 12.00    Types: Pipe  . Smokeless tobacco: Never Used  Substance Use Topics  . Alcohol use: No    Alcohol/week: 0.0 standard drinks  . Drug use:  No         OPHTHALMIC EXAM:  Base Eye Exam    Visual Acuity (Snellen - Linear)      Right Left   Dist [place] 20/70 -2 CF @ 1'   Dist ph [place] 20/50 -2 NI       Tonometry (Tonopen, 12:52 PM)      Right Left   Pressure 17 12       Pupils      Dark APD   Right irregular 0   Left irregular 0  Irregularly shaped. No MG       Visual Fields   Poor cooperation and understanding.  Appears constricted OS       Extraocular Movement      Right Left    Full Full       Neuro/Psych    Oriented x3: Yes   Mood/Affect: Normal       Dilation    Both eyes: 1.0% Mydriacyl, 2.5% Phenylephrine @ 12:52 PM        Slit Lamp and Fundus Exam    Slit Lamp Exam      Right Left   Lids/Lashes Dermatochalasis - upper lid Dermatochalasis - upper lid   Conjunctiva/Sclera 1+ Injection, ST Bleb and tube shunt 1+ Injection, melanosis, ST Bleb and tube shunt   Cornea Arcus, 1-2+ inferior Punctate epithelial erosions Arcus, trace Punctate epithelial erosions, tear film debris   Anterior Chamber deep centrally, narrow angle, tube at 1030 extending into pupil, no cell or flare deep centrally, narrow angle, tube at 0200 extending into pupil, no cell or flare   Iris Round and dilated, surgical PI at 0130 Irregular pupil with Posterior synechiae at 0200    Lens Posterior chamber intraocular lens Posterior chamber intraocular lens   Vitreous Vitreous syneresis Vitreous syneresis, Posterior vitreous detachment       Fundus Exam      Right Left   Disc 4+ Pallor, +cupping 4+ Pallor, +cupping   C/D Ratio 0.95 0.95   Macula Flat, Blunted foveal reflex, No heme or edema Flat, Blunted foveal reflex, No heme or edema   Vessels Vascular attenuation Vascular attenuation   Periphery Attached    Attached           Refraction    Wearing Rx      Sphere   Right None   Left None       Manifest Refraction      Sphere Cylinder Axis Dist VA   Right -0.50 +0.50 020 20/50-3   Left NI +/-3.00             IMAGING AND PROCEDURES  Imaging and Procedures for @TODAY @  OCT, Retina - OU - Both Eyes       Right Eye Quality was poor. Central Foveal Thickness: 295. Progression has no prior data. Findings include abnormal foveal contour, no SRF, intraretinal fluid, epiretinal membrane (Diffuse atrophy; massive cupping of disc caught on widefield scans).   Left Eye Quality was good. Central Foveal Thickness: 306. Progression has no prior data. Findings include normal foveal contour, no SRF, no IRF (Diffuse atrophy; massive cupping of disc caught on widefield scans).   Notes *Images captured and stored on drive  Diagnosis / Impression:  OD: very hazy image; abnormal foveal contour, ?central IRF; massive cupping of disc caught on widefield OS: Diffuse atrophy; massive cupping of disc caught on widefield scan   Clinical management:  See below  Abbreviations: NFP - Normal foveal profile. CME -  cystoid macular edema. PED - pigment epithelial detachment. IRF - intraretinal fluid. SRF - subretinal fluid. EZ - ellipsoid zone. ERM - epiretinal membrane. ORA - outer retinal atrophy. ORT - outer retinal tubulation. SRHM - subretinal hyper-reflective material                 ASSESSMENT/PLAN:    ICD-10-CM   1. Primary open angle glaucoma (POAG)  of both eyes, severe stage  H40.1133   2. Diabetes mellitus type 2 without retinopathy ([place])  E11.9   3. Retinal edema  H35.81 OCT, Retina - OU - Both Eyes  4. Essential hypertension  I10   5. Hypertensive retinopathy of both eyes  H35.033   6. Pseudophakia of both eyes  Z96.1     1. Severe POAG OD, end-stage OS  - incomplete history, but pt clearly has a long history of glaucoma with medical noncompliance and multiple eye doctors over the years  - pt with very poor insight into disease  - s/p tube shunts OU -- pt reports OD done at [facility] done "elsewhere"  - review of [place] shows extensive visits and correspondence with [facility] and that both tubes were done at [facility]   - s/p Ahmed Valve glaucoma tube shunt OD, 4.3.2018, Dr. Delsa Sale at [facility]  - s/p Baerveldt 350 glaucoma tube shunt OS, 2.5.2019, Dr. Delsa Sale at [facility]  - c/d >/= 0.9 OU  - IOP 17 OD, 12 OS  - pt is taking no drops, but has listed in his chart, Alphagan, Latanoprost, and timolol  - pt presented to Dr. Gershon Crane for acute vision loss OS, but VA at last visit at [facility] on 2.2.20 was 20/60 OD and CF at face OS -- same as today  - explained to pt that poor vision is due to glaucoma and recommend he f/u with glaucoma specialist, but start Cosopt BID OU  - pt wishes to see ophthalmologist in [place] and does not want to travel to see a glaucoma specialist  - will refer to Dr. Quentin Ore for eval and management of severe POAG  2,3. Diabetes mellitus, type 2 without retinopathy  - The incidence, risk factors for progression, natural history and treatment options for diabetic retinopathy  were discussed with patient.    - The need for close monitoring of blood glucose, blood pressure, and serum lipids, avoiding cigarette or any type of tobacco, and the need for long term follow up was also discussed with patient.  4,5. Hypertensive retinopathy OU  - discussed importance of tight BP control  - monitor  6.  Pseudophakia OU  - s/p CE/IOL (Dr. Clent Jacks)  - beautiful surgeries w/ IOLs in good position  - monitor  Ophthalmic Meds Ordered this visit:  No orders of the defined types were placed in this encounter.      Return if symptoms worsen or fail to improve.  There are no Patient Instructions on file for this visit.   Explained the diagnoses, plan, and follow up with the patient and they expressed understanding.  Patient expressed understanding of the importance of proper follow up care.    This document serves as a record of services personally performed by Gardiner Sleeper, MD, PhD. It was created on their behalf by Ernest Mallick, OA, an ophthalmic assistant. The creation of this record is the provider's dictation and/or activities during the visit.    Electronically signed by: Ernest Mallick, OA  07.06.2020 5:52 PM    Gardiner Sleeper,  M.D., Ph.D. Diseases & Surgery of the Retina and [place]  I have reviewed the above documentation for accuracy and completeness, and I agree with the above. Gardiner Sleeper, M.D., Ph.D. 12/21/18 5:52 PM    Abbreviations: M myopia (nearsighted); A astigmatism; H hyperopia (farsighted); P presbyopia; Mrx spectacle prescription;  CTL contact lenses; OD right eye; OS left eye; OU both eyes  XT exotropia; ET esotropia; PEK punctate epithelial keratitis; PEE punctate epithelial erosions; DES dry eye syndrome; MGD meibomian gland dysfunction; ATs artificial tears; PFAT's preservative free artificial tears; [place] nuclear sclerotic cataract; PSC posterior subcapsular cataract; ERM epi-retinal membrane; PVD posterior vitreous detachment; RD retinal detachment; DM diabetes mellitus; DR diabetic retinopathy; NPDR non-proliferative diabetic retinopathy; PDR proliferative diabetic retinopathy; CSME clinically significant macular edema; DME diabetic macular edema; dbh dot blot hemorrhages; CWS cotton wool spot; POAG primary open angle  glaucoma; C/D cup-to-disc ratio; HVF humphrey visual field; GVF goldmann visual field; OCT optical coherence tomography; IOP intraocular pressure; BRVO Branch retinal vein occlusion; CRVO central retinal vein occlusion; CRAO central retinal artery occlusion; BRAO branch retinal artery occlusion; RT retinal tear; SB scleral buckle; PPV pars plana vitrectomy; VH Vitreous hemorrhage; PRP panretinal laser photocoagulation; IVK intravitreal kenalog; VMT vitreomacular traction; MH Macular hole;  NVD neovascularization of the disc; NVE neovascularization elsewhere; AREDS age related eye disease study; ARMD age related macular degeneration; POAG primary open angle glaucoma; EBMD epithelial/anterior basement membrane dystrophy; ACIOL anterior chamber intraocular lens; IOL intraocular lens; PCIOL posterior chamber intraocular lens; Phaco/IOL phacoemulsification with intraocular lens placement; [place] photorefractive keratectomy; LASIK laser assisted in situ keratomileusis; HTN hypertension; DM diabetes mellitus; COPD chronic obstructive pulmonary disease

## 2018-12-28 ENCOUNTER — Telehealth: Payer: Self-pay | Admitting: Orthopedic Surgery

## 2018-12-28 NOTE — Telephone Encounter (Signed)
April with [facility] called to let Dr Sharol Given know that patient foot wound has gotten worse & is not healing. April thinks patient needs to be seen and/or get other home health instruction. April call back # [phone]

## 2018-12-28 NOTE — Telephone Encounter (Signed)
Patient was called and scheduled to come in to be seen and wanted to come in on Friday 01/01/2019 but I encouraged patient to come in tomorrow to be seen since [facility] felt it was urgent, patient complied and will be seen in our office in the morning.

## 2018-12-29 ENCOUNTER — Ambulatory Visit (INDEPENDENT_AMBULATORY_CARE_PROVIDER_SITE_OTHER): Payer: Medicare HMO | Admitting: Family

## 2018-12-29 ENCOUNTER — Other Ambulatory Visit: Payer: Self-pay

## 2018-12-29 ENCOUNTER — Encounter: Payer: Self-pay | Admitting: Family

## 2018-12-29 DIAGNOSIS — L97411 Non-pressure chronic ulcer of right heel and midfoot limited to breakdown of skin: Secondary | ICD-10-CM | POA: Diagnosis not present

## 2018-12-29 DIAGNOSIS — L97421 Non-pressure chronic ulcer of left heel and midfoot limited to breakdown of skin: Secondary | ICD-10-CM | POA: Diagnosis not present

## 2018-12-29 DIAGNOSIS — Z89432 Acquired absence of left foot: Secondary | ICD-10-CM

## 2018-12-29 MED ORDER — DOXYCYCLINE HYCLATE 100 MG PO TABS: 100.0000 mg | ORAL_TABLET | Freq: Two times a day (BID) | ORAL | 0 refills | Status: DC

## 2018-12-29 NOTE — Progress Notes (Signed)
Office Visit Note   Patient: [name]           Date of Birth: Jul 13, 1940           MRN: [id number] Visit Date: 12/29/2018              Requested by: [name], MD [address] [place],  [place] [address] PCP: [name], MD  Chief Complaint  Patient presents with  . Left Foot - Wound Check  . Right Foot - Wound Check      HPI: Patient is a 78 year old gentleman who presents in follow-up for bilateral transmetatarsal amputations.   Patient has a Wagner grade 1 ulcer on the left foot worse than the right.  Assessment & Plan: Visit Diagnoses:  1. 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 Midfoot skin ulcer, right, limited to breakdown of skin ([place])   3. Midfoot ulcer, left, limited to breakdown of skin ([place])     Plan: odor from drainage, no purulence. Ulcers were debrided of skin and soft tissue there was good healthy tissue on both feet.  Patient will continue with daily silvadene dressing changes. we will apply dressing today. minimize weightbearing.  Will order a 2 week course of oral antibiotic. Would like to follow up in 2 weeks.   Follow-Up Instructions: No follow-ups on file.   Ortho Exam  Patient is alert, oriented, no adenopathy, well-dressed, normal affect, normal respiratory effort. Examination patient is ambulating with regular shoes and a cane.  He has increased callus formation of both feet. Increased maceration and odor. There are 3 large Wegner grade 1 ulcers 2 on the left one on the right.  After informed consent a 10 blade knife was used to debride the skin and soft tissue back to healthy viable tissue.  The heel ulcer on the left is 1 cm in diameter with no depth. This shows no signs of infection.  The midfoot ulcer on the left is 2 cm in diameter and 6 cm long. The tissue was healthy with good granulation tissue both wounds were debrided of skin and soft tissue with a 10 blade knife peer  examination the right foot patient also has a Wagner grade 1 ulcer that is 2 cm in diameter.  After informed consent this was debrided of skin and soft tissue back to healthy viable tissue.  There is no signs of infection in either foot there is no swelling no drainage no cellulitis.  Imaging: No results found. No images are attached to the encounter.  Labs: Lab Results  Component Value Date   HGBA1C 5.4 11/01/2018   HGBA1C 5.2 05/29/2018   HGBA1C 5.4 01/30/2018   ESRSEDRATE 34 (H) 12/17/2017   ESRSEDRATE 45 (H) 09/27/2017   ESRSEDRATE 75 (H) 07/28/2017   CRP 17.1 (H) 12/17/2017   CRP 1.8 07/28/2017   CRP 15.8 (H) 09/06/2014   REPTSTATUS 10/25/2013 FINAL 10/24/2013   CULT NO GROWTH Performed at [facility] 10/24/2013     Lab Results  Component Value Date   ALBUMIN 3.2 (L) 10/31/2018   ALBUMIN 3.5 05/28/2018   ALBUMIN 3.9 09/27/2017    There is no height or weight on file to calculate BMI.  Orders:  No orders of the defined types were placed in this encounter.  No orders of the defined types were placed in this encounter.    Procedures: No procedures performed  Clinical Data: No additional findings.  ROS:  All  other systems negative, except as noted in the HPI. Review of Systems  Constitutional: Negative for chills and fever.  Cardiovascular: Negative for leg swelling.  Skin: Positive for wound. Negative for color change.    Objective: Vital Signs: There were no vitals taken for this visit.  Specialty Comments:  No specialty comments available.  PMFS History: Patient Active Problem List   Diagnosis Date Noted  . Midfoot ulcer, left, limited to breakdown of skin ([place]) 12/29/2018  . Occipital stroke ([place]) 10/31/2018  . DM2 (diabetes mellitus, type 2) ([place]) 10/31/2018  . NSTEMI (non-ST elevated myocardial infarction) ([place]) 05/28/2018  . Syncope 05/28/2018  . HTN (hypertension) 05/28/2018  . Hyperlipemia, mixed 05/28/2018  . Rash and  nonspecific skin eruption 12/16/2017  . Pain in right hand 09/11/2017  . Carpal tunnel syndrome, right upper limb 07/01/2017  . Arthritis of carpometacarpal [facility]) joint of left thumb 12/30/2016  . Midfoot skin ulcer, right, limited to breakdown of skin ([place]) 07/22/2016  . 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[place]) 09/23/2014  . Osteomyelitis of ankle or foot, left, acute ([place]) 08/23/2014  . Cellulitis 10/12/2012  . Chest pain 10/12/2012  . Tobacco abuse 10/12/2012  . ABSCESS, AXILLA, LEFT 02/23/2008  . 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 CHEST WALL PAIN, ACUTE 12/03/2007  . CANDIDIASIS, GLANS PENIS 09/16/2007  . HEADACHE 08/04/2007  . 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 Other and unspecified hyperlipidemia 03/05/2007  . GOUT 03/05/2007  . ERECTILE DYSFUNCTION 03/05/2007  . EXTERNAL HEMORRHOIDS 03/05/2007  . VENTRAL HERNIA 03/05/2007  . FATTY LIVER DISEASE 03/05/2007  . Osteoarthritis 03/05/2007  . HEMORRHOIDS, INTERNAL 03/17/2005  . COLONIC POLYPS, HX OF 03/17/2005   Past Medical History:  Diagnosis Date  . Arthritis   . Diabetic neuropathy ([place])   . High cholesterol   . Hypertension    denies  . Osteomyelitis (HCC)    Right great toe  . Peripheral vascular disease ([place])    diabetic  with osteomylitis  . Stroke (cerebrum) ([place]) 10/2018  . Type II diabetes mellitus (HCC)     Family History  Problem Relation Age of Onset  . Diabetes Mellitus II Other   . Anesthesia problems Neg Hx     Past Surgical History:  Procedure Laterality Date  . AMPUTATION  07/23/2011   Procedure: AMPUTATION DIGIT;  Surgeon: Newt Minion, MD;  Location: [facility];  Service: Orthopedics;  Laterality: Right;  Right Great Toe Amputation  . AMPUTATION Right 09/18/2012   Procedure: Right Foot 2nd Ray Amputation;  Surgeon: Newt Minion, MD;  Location: [facility];  Service: Orthopedics;  Laterality: Right;  Right Foot 2nd  Ray Amputation  . AMPUTATION Right 10/14/2012   Procedure: AMPUTATION FOOT;  Surgeon: Newt Minion, MD;  Location: [facility];  Service: Orthopedics;  Laterality: Right;  Right Foot Transmetatarsal Amputation  . AMPUTATION Left 09/23/2014   Procedure: Left Foot Transmetatarsal Amputation;  Surgeon: Newt Minion, MD;  Location: [facility];  Service: Orthopedics;  Laterality: Left;  . BACK SURGERY     lower  . CARPAL TUNNEL RELEASE Right 01/30/2018   Procedure: RIGHT CARPAL TUNNEL RELEASE;  Surgeon: Newt Minion, MD;  Location: [facility];  Service: Orthopedics;  Laterality: Right;  . CIRCUMCISION  2010  . COLONOSCOPY    . FOOT FRACTURE SURGERY Left 1970's   "broke it playing football" (10/12/2012)  . HUMERUS FRACTURE SURGERY W/ IMPLANT Right 1960's   "put a  plate in it" (8/46/6599)  . INCISION AND DRAINAGE OF WOUND Left 2006   "foot" (10/12/2012)  . LUMBAR [place] SURGERY  2008   Social History   Occupational History  . Not on file  Tobacco Use  . Smoking status: Current Every Day Smoker    Years: 12.00    Types: Pipe  . Smokeless tobacco: Never Used  Substance and Sexual Activity  . Alcohol use: No    Alcohol/week: 0.0 standard drinks  . Drug use: No  . Sexual activity: Not Currently

## 2019-01-12 ENCOUNTER — Telehealth: Payer: Self-pay | Admitting: Family

## 2019-01-12 ENCOUNTER — Ambulatory Visit (INDEPENDENT_AMBULATORY_CARE_PROVIDER_SITE_OTHER): Payer: Medicare HMO | Admitting: Family

## 2019-01-12 ENCOUNTER — Encounter: Payer: Self-pay | Admitting: Family

## 2019-01-12 VITALS — Ht 73.0 in | Wt 185.0 lb

## 2019-01-12 DIAGNOSIS — L97411 Non-pressure chronic ulcer of right heel and midfoot limited to breakdown of skin: Secondary | ICD-10-CM

## 2019-01-12 DIAGNOSIS — L97421 Non-pressure chronic ulcer of left heel and midfoot limited to breakdown of skin: Secondary | ICD-10-CM

## 2019-01-12 DIAGNOSIS — Z89432 Acquired absence of left foot: Secondary | ICD-10-CM

## 2019-01-12 DIAGNOSIS — R6889 Other general symptoms and signs: Secondary | ICD-10-CM | POA: Diagnosis not present

## 2019-01-12 DIAGNOSIS — Z89431 Acquired absence of right foot: Secondary | ICD-10-CM

## 2019-01-12 DIAGNOSIS — E1142 Type 2 diabetes mellitus with diabetic polyneuropathy: Secondary | ICD-10-CM | POA: Diagnosis not present

## 2019-01-12 NOTE — Telephone Encounter (Signed)
No, not appropriate

## 2019-01-12 NOTE — Telephone Encounter (Signed)
I called and sw the pt and advised of message below. Will call with any other questions.

## 2019-01-12 NOTE — Telephone Encounter (Signed)
Patient called and stated that forgot to ask Erin for pain medication.  Please call patient to advise.

## 2019-01-12 NOTE — Telephone Encounter (Signed)
Did you want to give the pt pain medication?

## 2019-01-12 NOTE — Progress Notes (Signed)
Office Visit Note   Patient: [name]           Date of Birth: 1941-03-08           MRN: [id number] Visit Date: 01/12/2019              Requested by: [name], MD [address] [place],  [place] [address] PCP: [name], MD  No chief complaint on file.     HPI: Patient is a 78 year old gentleman who presents in follow-up for [place] grade 1 ulcers to bilateral feet.  He is status post bilateral transmetatarsal amputations.  He has been full weightbearing in regular shoewear.   Has foam dressings bilaterally these were placed by home health.  He is unable to see his own feet.  Assessment & Plan: Visit Diagnoses:  1. Midfoot skin ulcer, right, limited to breakdown of skin ([place])   2. Midfoot ulcer, left, limited to breakdown of skin ([place])   3. History of transmetatarsal amputation of right foot ([place])   4. Status post transmetatarsal amputation of left foot ([place])   5. Type 2 diabetes mellitus with diabetic polyneuropathy, unspecified whether long term insulin use ([place])     Plan: He continues to have odor from drainage however there is no purulence or no sign of infection.  He will continue with his home foot care will follow-up in the office in 3 weeks.     Follow-Up Instructions: Return in about 4 weeks (around 02/09/2019).   Ortho Exam  Patient is alert, oriented, no adenopathy, well-dressed, normal affect, normal respiratory effort.  On examination of bilateral lower extremities there is no edema no ulcerations to the foot.  He has increased callus formation of the right foot there is a large fissure.  Central fissure is 4 cm long and 5 mm wide there is granulation.  Surrounding there is proud macerated tissue this is part of the 10 blade knife back to viable tissue there is odor from maceration however there is no active drainage does have a plantar heel callus this is 15 mm in diameter and 3 mm thick this was part of the 10 blade knife  back to viable tissue there is no underlying abscess.  On the left foot there is a plantar ulcer that is 2 cm in diameter this was also debrided back to viable tissue there is no active drainage no surrounding erythema no maceration no sign of infection The heel ulcer on the left is 1 cm in diameter with no depth. This shows no signs of infection. .  Imaging: No results found. No images are attached to the encounter.  Labs: Lab Results  Component Value Date   HGBA1C 5.4 11/01/2018   HGBA1C 5.2 05/29/2018   HGBA1C 5.4 01/30/2018   ESRSEDRATE 34 (H) 12/17/2017   ESRSEDRATE 45 (H) 09/27/2017   ESRSEDRATE 75 (H) 07/28/2017   CRP 17.1 (H) 12/17/2017   CRP 1.8 07/28/2017   CRP 15.8 (H) 09/06/2014   REPTSTATUS 10/25/2013 FINAL 10/24/2013   CULT NO GROWTH Performed at [facility] 10/24/2013     Lab Results  Component Value Date   ALBUMIN 3.2 (L) 10/31/2018   ALBUMIN 3.5 05/28/2018   ALBUMIN 3.9 09/27/2017    Body mass index is 24.41 kg/m.  Orders:  No orders of the defined types were placed in this encounter.  No orders of the defined types were placed in this encounter.    Procedures: No procedures performed  Clinical Data:  No additional findings.  ROS:  All other systems negative, except as noted in the HPI. Review of Systems  Constitutional: Negative for chills and fever.  Cardiovascular: Negative for leg swelling.  Skin: Positive for wound. Negative for color change.    Objective: Vital Signs: Ht 6\' 1"  (1.854 m)   Wt 185 lb (83.9 kg)   BMI 24.41 kg/m   Specialty Comments:  No specialty comments available.  PMFS History: Patient Active Problem List   Diagnosis Date Noted  . Midfoot ulcer, left, limited to breakdown of skin ([place]) 12/29/2018  . Occipital stroke ([place]) 10/31/2018  . DM2 (diabetes mellitus, type 2) ([place]) 10/31/2018  . NSTEMI (non-ST elevated myocardial infarction) ([place]) 05/28/2018  . Syncope 05/28/2018  . HTN (hypertension)  05/28/2018  . Hyperlipemia, mixed 05/28/2018  . Rash and nonspecific skin eruption 12/16/2017  . Pain in right hand 09/11/2017  . Carpal tunnel syndrome, right upper limb 07/01/2017  . Arthritis of carpometacarpal [facility]) joint of left thumb 12/30/2016  . Midfoot skin ulcer, right, limited to breakdown of skin ([place]) 07/22/2016  . 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[place]) 09/23/2014  . Osteomyelitis of ankle or foot, left, acute ([place]) 08/23/2014  . Cellulitis 10/12/2012  . Chest pain 10/12/2012  . Tobacco abuse 10/12/2012  . ABSCESS, AXILLA, LEFT 02/23/2008  . 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 CHEST WALL PAIN, ACUTE 12/03/2007  . CANDIDIASIS, GLANS PENIS 09/16/2007  . HEADACHE 08/04/2007  . 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 Other and unspecified hyperlipidemia 03/05/2007  . GOUT 03/05/2007  . ERECTILE DYSFUNCTION 03/05/2007  . EXTERNAL HEMORRHOIDS 03/05/2007  . VENTRAL HERNIA 03/05/2007  . FATTY LIVER DISEASE 03/05/2007  . Osteoarthritis 03/05/2007  . HEMORRHOIDS, INTERNAL 03/17/2005  . COLONIC POLYPS, HX OF 03/17/2005   Past Medical History:  Diagnosis Date  . Arthritis   . Diabetic neuropathy ([place])   . High cholesterol   . Hypertension    denies  . Osteomyelitis (HCC)    Right great toe  . Peripheral vascular disease ([place])    diabetic  with osteomylitis  . Stroke (cerebrum) ([place]) 10/2018  . Type II diabetes mellitus (HCC)     Family History  Problem Relation Age of Onset  . Diabetes Mellitus II Other   . Anesthesia problems Neg Hx     Past Surgical History:  Procedure Laterality Date  . AMPUTATION  07/23/2011   Procedure: AMPUTATION DIGIT;  Surgeon: Newt Minion, MD;  Location: [place];  Service: Orthopedics;  Laterality: Right;  Right Great Toe Amputation  . AMPUTATION Right 09/18/2012   Procedure: Right Foot 2nd Ray Amputation;  Surgeon: Newt Minion, MD;  Location: [place];   Service: Orthopedics;  Laterality: Right;  Right Foot 2nd Ray Amputation  . AMPUTATION Right 10/14/2012   Procedure: AMPUTATION FOOT;  Surgeon: Newt Minion, MD;  Location: [place];  Service: Orthopedics;  Laterality: Right;  Right Foot Transmetatarsal Amputation  . AMPUTATION Left 09/23/2014   Procedure: Left Foot Transmetatarsal Amputation;  Surgeon: Newt Minion, MD;  Location: [facility];  Service: Orthopedics;  Laterality: Left;  . BACK SURGERY     lower  . CARPAL TUNNEL RELEASE Right 01/30/2018   Procedure: RIGHT CARPAL TUNNEL RELEASE;  Surgeon: Newt Minion, MD;  Location: [place];  Service: Orthopedics;  Laterality: Right;  . CIRCUMCISION  2010  . COLONOSCOPY    . FOOT FRACTURE SURGERY Left 1970's   "broke  it playing football" (10/12/2012)  . HUMERUS FRACTURE SURGERY W/ IMPLANT Right 1960's   "put a plate in it" (0/92/3300)  . INCISION AND DRAINAGE OF WOUND Left 2006   "foot" (10/12/2012)  . LUMBAR [place] SURGERY  2008   Social History   Occupational History  . Not on file  Tobacco Use  . Smoking status: Current Every Day Smoker    Years: 12.00    Types: Pipe  . Smokeless tobacco: Never Used  Substance and Sexual Activity  . Alcohol use: No    Alcohol/week: 0.0 standard drinks  . Drug use: No  . Sexual activity: Not Currently

## 2019-01-14 DIAGNOSIS — M19041 Primary osteoarthritis, right hand: Secondary | ICD-10-CM | POA: Diagnosis not present

## 2019-01-14 DIAGNOSIS — E11621 Type 2 diabetes mellitus with foot ulcer: Secondary | ICD-10-CM | POA: Diagnosis not present

## 2019-01-14 DIAGNOSIS — E1142 Type 2 diabetes mellitus with diabetic polyneuropathy: Secondary | ICD-10-CM | POA: Diagnosis not present

## 2019-01-14 DIAGNOSIS — Z720 Tobacco use: Secondary | ICD-10-CM | POA: Diagnosis not present

## 2019-01-14 DIAGNOSIS — E785 Hyperlipidemia, unspecified: Secondary | ICD-10-CM | POA: Diagnosis not present

## 2019-01-14 DIAGNOSIS — E1169 Type 2 diabetes mellitus with other specified complication: Secondary | ICD-10-CM | POA: Diagnosis not present

## 2019-01-26 DIAGNOSIS — R6889 Other general symptoms and signs: Secondary | ICD-10-CM | POA: Diagnosis not present

## 2019-01-27 ENCOUNTER — Telehealth: Payer: Self-pay | Admitting: Family

## 2019-01-27 NOTE — Telephone Encounter (Signed)
Patient was called and notified that Rx was sent to [facility] with demo and last office notes.

## 2019-01-27 NOTE — Telephone Encounter (Signed)
Patient states that Junie Panning was to order him some shoes and he has not heard from anyone and would like a call back with the status.

## 2019-02-09 ENCOUNTER — Ambulatory Visit: Payer: Medicare HMO | Admitting: Family

## 2019-02-10 ENCOUNTER — Ambulatory Visit: Payer: Medicare HMO | Admitting: Allergy

## 2019-02-23 ENCOUNTER — Telehealth: Payer: Self-pay | Admitting: Orthopedic Surgery

## 2019-02-23 NOTE — Telephone Encounter (Signed)
Patient called asked if his shoes were ordered? Please see previous message. The number to contact patient is [phone]

## 2019-02-25 NOTE — Telephone Encounter (Signed)
Patient was called and informed that [employer] has been faxed already dated 01/27/2019 for his office notes and shoes. Patient was given the number to call [employer].

## 2019-02-28 ENCOUNTER — Other Ambulatory Visit: Payer: Self-pay

## 2019-02-28 ENCOUNTER — Emergency Department (HOSPITAL_COMMUNITY)
Admission: EM | Admit: 2019-02-28 | Discharge: 2019-02-28 | Disposition: A | Discharge status: Home / Self Care | Payer: Medicare Other | Attending: Emergency Medicine | Admitting: Emergency Medicine

## 2019-02-28 ENCOUNTER — Encounter (HOSPITAL_COMMUNITY): Payer: Self-pay | Admitting: Emergency Medicine

## 2019-02-28 DIAGNOSIS — I10 Essential (primary) hypertension: Secondary | ICD-10-CM | POA: Diagnosis not present

## 2019-02-28 DIAGNOSIS — F17210 Nicotine dependence, cigarettes, uncomplicated: Secondary | ICD-10-CM | POA: Diagnosis not present

## 2019-02-28 DIAGNOSIS — E119 Type 2 diabetes mellitus without complications: Secondary | ICD-10-CM | POA: Insufficient documentation

## 2019-02-28 DIAGNOSIS — Z79899 Other long term (current) drug therapy: Secondary | ICD-10-CM | POA: Diagnosis not present

## 2019-02-28 DIAGNOSIS — H5711 Ocular pain, right eye: Secondary | ICD-10-CM | POA: Insufficient documentation

## 2019-02-28 MED ORDER — TETRACAINE HCL 0.5 % OP SOLN
1.0000 [drp] | Freq: Once | OPHTHALMIC | Status: AC
  Administered 2019-02-28: 1 [drp] via OPHTHALMIC
  Filled 2019-02-28: qty 4

## 2019-02-28 NOTE — ED Triage Notes (Signed)
Report from [facility]> C/o blurred vision to R eye x 1 year and headache x 2 hours.  Seen by opthalmology 2 weeks ago and given gtts that aren't helping.  No arm drift.  Denies numbness and weakness.

## 2019-02-28 NOTE — ED Notes (Signed)
Patient verbalizes resolution of pain in the right eye. No complaints at this time.

## 2019-02-28 NOTE — Discharge Instructions (Addendum)
The pressure in your R eye was just minimally elevated at 22. I do not think your symptoms today are from glaucoma. I want you to use your eye drops when you get home. If you symptoms persist more than another day or two then I want you to see your eye doctor.

## 2019-03-01 ENCOUNTER — Ambulatory Visit: Payer: Medicare HMO | Admitting: Orthopedic Surgery

## 2019-03-02 NOTE — ED Provider Notes (Signed)
[facility] EMERGENCY DEPARTMENT Provider Note   CSN: [id number] Arrival date & time: 02/28/19  1540     History   Chief Complaint Chief Complaint  Patient presents with  . Blurred Vision  . Headache    HPI [name] is a 78 y.o. male.     HPI   78 year old male with pain and blurred vision in his right eye.  He reports a history of blurred vision for approximately the past year but more acutely changed in the past couple hours and also some mild discomfort.  History of glaucoma.  He is on timolol but not take his drops this morning.  No other acute complaints.  Past Medical History:  Diagnosis Date  . Arthritis   . Diabetic neuropathy ([place])   . High cholesterol   . Hypertension    denies  . Osteomyelitis (HCC)    Right great toe  . Peripheral vascular disease ([place])    diabetic  with osteomylitis  . Stroke (cerebrum) ([place]) 10/2018  . Type II diabetes mellitus [facility])     Patient Active Problem List   Diagnosis Date Noted  . Midfoot ulcer, left, limited to breakdown of skin ([place]) 12/29/2018  . Occipital stroke ([place]) 10/31/2018  . DM2 (diabetes mellitus, type 2) ([place]) 10/31/2018  . NSTEMI (non-ST elevated myocardial infarction) ([place]) 05/28/2018  . Syncope 05/28/2018  . HTN (hypertension) 05/28/2018  . Hyperlipemia, mixed 05/28/2018  . Rash and nonspecific skin eruption 12/16/2017  . Pain in right hand 09/11/2017  . Carpal tunnel syndrome, right upper limb 07/01/2017  . Arthritis of carpometacarpal [facility]) joint of left thumb 12/30/2016  . Midfoot skin ulcer, right, limited to breakdown of skin ([place]) 07/22/2016  . 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[place]) 09/23/2014  . Osteomyelitis of ankle or foot, left, acute ([place]) 08/23/2014  . Cellulitis 10/12/2012  . Chest pain 10/12/2012  . Tobacco abuse 10/12/2012  . ABSCESS, AXILLA, LEFT 02/23/2008  . 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 CHEST WALL PAIN, ACUTE 12/03/2007  . CANDIDIASIS, GLANS  PENIS 09/16/2007  . HEADACHE 08/04/2007  . 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 Other and unspecified hyperlipidemia 03/05/2007  . GOUT 03/05/2007  . ERECTILE DYSFUNCTION 03/05/2007  . EXTERNAL HEMORRHOIDS 03/05/2007  . VENTRAL HERNIA 03/05/2007  . FATTY LIVER DISEASE 03/05/2007  . Osteoarthritis 03/05/2007  . HEMORRHOIDS, INTERNAL 03/17/2005  . COLONIC POLYPS, HX OF 03/17/2005    Past Surgical History:  Procedure Laterality Date  . AMPUTATION  07/23/2011   Procedure: AMPUTATION DIGIT;  Surgeon: Newt Minion, MD;  Location: [facility];  Service: Orthopedics;  Laterality: Right;  Right Great Toe Amputation  . AMPUTATION Right 09/18/2012   Procedure: Right Foot 2nd Ray Amputation;  Surgeon: Newt Minion, MD;  Location: [facility];  Service: Orthopedics;  Laterality: Right;  Right Foot 2nd Ray Amputation  . AMPUTATION Right 10/14/2012   Procedure: AMPUTATION FOOT;  Surgeon: Newt Minion, MD;  Location: [facility];  Service: Orthopedics;  Laterality: Right;  Right Foot Transmetatarsal Amputation  . AMPUTATION Left 09/23/2014   Procedure: Left Foot Transmetatarsal Amputation;  Surgeon: Newt Minion, MD;  Location: [place];  Service: Orthopedics;  Laterality: Left;  . BACK SURGERY     lower  . CARPAL TUNNEL RELEASE Right 01/30/2018   Procedure: RIGHT CARPAL TUNNEL RELEASE;  Surgeon: Newt Minion, MD;  Location: [place];  Service: Orthopedics;  Laterality: Right;  .  CIRCUMCISION  2010  . COLONOSCOPY    . FOOT FRACTURE SURGERY Left 1970's   "broke it playing football" (10/12/2012)  . HUMERUS FRACTURE SURGERY W/ IMPLANT Right 1960's   "put a plate in it" (QA348G)  . INCISION AND DRAINAGE OF WOUND Left 2006   "foot" (10/12/2012)  . [place] SURGERY  2008        Home Medications    Prior to Admission medications   Medication Sig Start Date End Date Taking? Authorizing Provider  acetaminophen (TYLENOL) 500 MG  tablet Take 1,000 mg by mouth every 6 (six) hours as needed for mild pain.    [provider]  ALPHAGAN P 0.1 % SOLN Place 1 drop into both eyes 2 (two) times daily.  12/31/16   [provider]  atorvastatin (LIPITOR) 20 MG tablet Take 20 mg by mouth daily. 08/31/18   [provider]  clopidogrel (PLAVIX) 75 MG tablet Take 1 tablet (75 mg total) by mouth daily. 11/01/18   Mariel Aloe, MD  doxycycline (VIBRA-TABS) 100 MG tablet Take 1 tablet (100 mg total) by mouth 2 (two) times daily. 12/29/18   Suzan Slick, NP  latanoprost (XALATAN) 0.005 % ophthalmic solution Place 1 drop into both eyes at bedtime.  09/12/14   [provider]  metFORMIN (GLUCOPHAGE) 1000 MG tablet Take 1,000 mg by mouth 2 (two) times daily.     [provider]  pyridOXINE (VITAMIN B-6) 50 MG tablet Take 50 mg by mouth daily.     [provider]  thiamine (VITAMIN B-1) 100 MG tablet Take 100 mg by mouth daily.    [provider]  timolol (TIMOPTIC) 0.5 % ophthalmic solution Place 1 drop into both eyes 2 (two) times daily. 05/19/17   [provider]  vitamin B-12 (CYANOCOBALAMIN) 500 MCG tablet Take 500 mcg by mouth daily.    [provider]    Family History Family History  Problem Relation Age of Onset  . Diabetes Mellitus II Other   . Anesthesia problems Neg Hx     Social History Social History   Tobacco Use  . Smoking status: Current Every Day Smoker    Years: 12.00    Types: Pipe  . Smokeless tobacco: Never Used  Substance Use Topics  . Alcohol use: No    Alcohol/week: 0.0 standard drinks  . Drug use: No     Allergies   Patient has no known allergies.   Review of Systems Review of Systems  All systems reviewed and negative, other than as noted in HPI.  Physical Exam Updated Vital Signs BP (!) 145/81   Pulse 63   Temp 98.6 F (37 C) (Oral)   Resp 15   SpO2 100%   Physical Exam Vitals signs and nursing note  reviewed.  Constitutional:      General: He is not in acute distress.    Appearance: He is well-developed.  HENT:     Head: Normocephalic and atraumatic.  Eyes:     General:        Right eye: No discharge.        Left eye: No discharge.     Conjunctiva/sclera: Conjunctivae normal.     Comments: Intraocular pressure in the right eye 22 and 22 again on repeat.  14 on the left.  Eye exam otherwise unremarkable.  Neck:     Musculoskeletal: Neck supple.  Cardiovascular:     Rate and Rhythm: Normal rate and regular rhythm.  Heart sounds: Normal heart sounds. No murmur. No friction rub. No gallop.   Pulmonary:     Effort: Pulmonary effort is normal. No respiratory distress.     Breath sounds: Normal breath sounds.  Abdominal:     General: There is no distension.     Palpations: Abdomen is soft.     Tenderness: There is no abdominal tenderness.  Musculoskeletal:        General: No tenderness.  Skin:    General: Skin is warm and dry.  Neurological:     Mental Status: He is alert.  Psychiatric:        Behavior: Behavior normal.        Thought Content: Thought content normal.      ED Treatments / Results  Labs (all labs ordered are listed, but only abnormal results are displayed) Labs Reviewed - No data to display  EKG EKG Interpretation  Date/Time:  Sunday February 28 2019 17:30:23 EDT Ventricular Rate:  58 PR Interval:    QRS Duration: 129 QT Interval:  486 QTC Calculation: 478 R Axis:   -38 Text Interpretation:  Sinus rhythm Ventricular premature complex Right bundle branch block Confirmed by Nikhil Osei ([id number]) on 02/28/2019 5:55:29 PM   Radiology No results found.  Procedures Procedures (including critical care time)  Medications Ordered in ED Medications  tetracaine (PONTOCAINE) 0.5 % ophthalmic solution 1 drop (1 drop Both Eyes Given by Other 02/28/19 1811)     Initial Impression / Assessment and Plan / ED Course  I have reviewed the triage vital  signs and the nursing notes.  Pertinent labs & imaging results that were available during my care of the patient were reviewed by me and considered in my medical decision making (see chart for details).        78  year old male with right eye pain and some acute on chronic blurred vision.  Symptoms now completely resolved.  Intraocular pressure of the right eyes is mildly elevated.  He did not take his timolol yet today.  Advised to take as soon as he gets home.  Home.  Do not feel that he needs further work-up at this time.  Advised follow-up with his ophthalmologist.  Emergent return precautions were discussed.  Final Clinical Impressions(s) / ED Diagnoses   Final diagnoses:  Eye pain, right    ED Discharge Orders    None       Virgel Manifold, MD 03/02/19 [phone]

## 2019-03-03 ENCOUNTER — Encounter: Payer: Self-pay | Admitting: Family

## 2019-03-03 ENCOUNTER — Ambulatory Visit (INDEPENDENT_AMBULATORY_CARE_PROVIDER_SITE_OTHER): Payer: Medicare Other | Admitting: Family

## 2019-03-03 VITALS — Ht 73.0 in | Wt 185.0 lb

## 2019-03-03 DIAGNOSIS — L97421 Non-pressure chronic ulcer of left heel and midfoot limited to breakdown of skin: Secondary | ICD-10-CM | POA: Diagnosis not present

## 2019-03-03 DIAGNOSIS — L97411 Non-pressure chronic ulcer of right heel and midfoot limited to breakdown of skin: Secondary | ICD-10-CM | POA: Diagnosis not present

## 2019-03-03 DIAGNOSIS — Z89432 Acquired absence of left foot: Secondary | ICD-10-CM

## 2019-03-03 DIAGNOSIS — Z89431 Acquired absence of right foot: Secondary | ICD-10-CM

## 2019-03-03 DIAGNOSIS — E1142 Type 2 diabetes mellitus with diabetic polyneuropathy: Secondary | ICD-10-CM

## 2019-03-03 NOTE — Progress Notes (Signed)
Office Visit Note   Patient: [name]           Date of Birth: July 04, 1940           MRN: [id number] Visit Date: 03/03/2019              Requested by: [name], MD [address] [place],  [place] [address] PCP: [name], MD  Chief Complaint  Patient presents with  . Right Foot - Pain, Follow-up  . Left Foot - Pain, Follow-up      HPI: Patient is a 78 year old gentleman who presents in follow-up for [place] grade 1 ulcers to bilateral feet.  He is status post bilateral transmetatarsal amputations.  He has been full weightbearing in regular shoewear.  Plans to pick up some custom shoes with custom orthotics later this week.  Has foam dressings bilaterally these were placed by home health.    Complains that Tylenol is ineffective for his arthritic pain.  Assessment & Plan: Visit Diagnoses:  No diagnosis found.  Plan: Calluses pard and wounds debrided.  Mupirocin dressings applied.  He will continue with his home foot care will follow-up in the office in 4 weeks.     Follow-Up Instructions: Return in about 4 weeks (around 03/31/2019).   Ortho Exam  Patient is alert, oriented, no adenopathy, well-dressed, normal affect, normal respiratory effort.  On examination of bilateral lower extremities there is no edema. He has increased callus formation of the left foot beneath the midfoot. there is also a large fissure.  Central fissure is 4 cm long and 5 mm wide there is granulation.  Surrounding there is proud macerated tissue this is part of the 10 blade knife back to viable tissue.  Dried blood covering his foot. there is no active drainage does have a plantar heel callus this is 15 mm in diameter and 5 mm thick this was pared with a 10 blade knife back to viable tissue there is no underlying abscess.  On the right foot there is a plantar ulcer that is 3 cm in diameter central ulceration is 1 cm in diameter and 3 mm deep. this was also debrided  back to viable tissue there is no active drainage no surrounding erythema no maceration no sign of infection  Imaging: No results found. No images are attached to the encounter.  Labs: Lab Results  Component Value Date   HGBA1C 5.4 11/01/2018   HGBA1C 5.2 05/29/2018   HGBA1C 5.4 01/30/2018   ESRSEDRATE 34 (H) 12/17/2017   ESRSEDRATE 45 (H) 09/27/2017   ESRSEDRATE 75 (H) 07/28/2017   CRP 17.1 (H) 12/17/2017   CRP 1.8 07/28/2017   CRP 15.8 (H) 09/06/2014   REPTSTATUS 10/25/2013 FINAL 10/24/2013   CULT NO GROWTH Performed at [facility] 10/24/2013     Lab Results  Component Value Date   ALBUMIN 3.2 (L) 10/31/2018   ALBUMIN 3.5 05/28/2018   ALBUMIN 3.9 09/27/2017    Body mass index is 24.41 kg/m.  Orders:  No orders of the defined types were placed in this encounter.  No orders of the defined types were placed in this encounter.    Procedures: No procedures performed  Clinical Data: No additional findings.  ROS:  All other systems negative, except as noted in the HPI. Review of Systems  Constitutional: Negative for chills and fever.  Cardiovascular: Negative for leg swelling.  Skin: Positive for wound. Negative for color change.    Objective: Vital Signs: Ht 6\' 1"  (  1.854 m)   Wt 185 lb (83.9 kg)   BMI 24.41 kg/m   Specialty Comments:  No specialty comments available.  PMFS History: Patient Active Problem List   Diagnosis Date Noted  . Midfoot ulcer, left, limited to breakdown of skin ([place]) 12/29/2018  . Occipital stroke ([place]) 10/31/2018  . DM2 (diabetes mellitus, type 2) ([place]) 10/31/2018  . NSTEMI (non-ST elevated myocardial infarction) ([place]) 05/28/2018  . Syncope 05/28/2018  . HTN (hypertension) 05/28/2018  . Hyperlipemia, mixed 05/28/2018  . Rash and nonspecific skin eruption 12/16/2017  . Pain in right hand 09/11/2017  . Carpal tunnel syndrome, right upper limb 07/01/2017  . Arthritis of carpometacarpal [facility]) joint of left thumb  12/30/2016  . Midfoot skin ulcer, right, limited to breakdown of skin ([place]) 07/22/2016  . 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[place]) 09/23/2014  . Osteomyelitis of ankle or foot, left, acute ([place]) 08/23/2014  . Cellulitis 10/12/2012  . Chest pain 10/12/2012  . Tobacco abuse 10/12/2012  . ABSCESS, AXILLA, LEFT 02/23/2008  . 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 CHEST WALL PAIN, ACUTE 12/03/2007  . CANDIDIASIS, GLANS PENIS 09/16/2007  . HEADACHE 08/04/2007  . 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 Other and unspecified hyperlipidemia 03/05/2007  . GOUT 03/05/2007  . ERECTILE DYSFUNCTION 03/05/2007  . EXTERNAL HEMORRHOIDS 03/05/2007  . VENTRAL HERNIA 03/05/2007  . FATTY LIVER DISEASE 03/05/2007  . Osteoarthritis 03/05/2007  . HEMORRHOIDS, INTERNAL 03/17/2005  . COLONIC POLYPS, HX OF 03/17/2005   Past Medical History:  Diagnosis Date  . Arthritis   . Diabetic neuropathy ([place])   . High cholesterol   . Hypertension    denies  . Osteomyelitis (HCC)    Right great toe  . Peripheral vascular disease ([place])    diabetic  with osteomylitis  . Stroke (cerebrum) ([place]) 10/2018  . Type II diabetes mellitus (HCC)     Family History  Problem Relation Age of Onset  . Diabetes Mellitus II Other   . Anesthesia problems Neg Hx     Past Surgical History:  Procedure Laterality Date  . AMPUTATION  07/23/2011   Procedure: AMPUTATION DIGIT;  Surgeon: Newt Minion, MD;  Location: [facility];  Service: Orthopedics;  Laterality: Right;  Right Great Toe Amputation  . AMPUTATION Right 09/18/2012   Procedure: Right Foot 2nd Ray Amputation;  Surgeon: Newt Minion, MD;  Location: [place];  Service: Orthopedics;  Laterality: Right;  Right Foot 2nd Ray Amputation  . AMPUTATION Right 10/14/2012   Procedure: AMPUTATION FOOT;  Surgeon: Newt Minion, MD;  Location: [facility];  Service: Orthopedics;  Laterality: Right;  Right Foot  Transmetatarsal Amputation  . AMPUTATION Left 09/23/2014   Procedure: Left Foot Transmetatarsal Amputation;  Surgeon: Newt Minion, MD;  Location: [place];  Service: Orthopedics;  Laterality: Left;  . BACK SURGERY     lower  . CARPAL TUNNEL RELEASE Right 01/30/2018   Procedure: RIGHT CARPAL TUNNEL RELEASE;  Surgeon: Newt Minion, MD;  Location: [place];  Service: Orthopedics;  Laterality: Right;  . CIRCUMCISION  2010  . COLONOSCOPY    . FOOT FRACTURE SURGERY Left 1970's   "broke it playing football" (10/12/2012)  . HUMERUS FRACTURE SURGERY W/ IMPLANT Right 1960's   "put a plate in it" (QA348G)  . INCISION AND DRAINAGE OF WOUND Left 2006   "foot" (10/12/2012)  . LUMBAR [place] SURGERY  2008   Social History   Occupational History  .  Not on file  Tobacco Use  . Smoking status: Current Every Day Smoker    Years: 12.00    Types: Pipe  . Smokeless tobacco: Never Used  Substance and Sexual Activity  . Alcohol use: No    Alcohol/week: 0.0 standard drinks  . Drug use: No  . Sexual activity: Not Currently

## 2019-03-05 ENCOUNTER — Ambulatory Visit: Payer: Medicare Other | Admitting: Family

## 2019-03-09 ENCOUNTER — Telehealth: Payer: Self-pay | Admitting: Family

## 2019-03-09 NOTE — Telephone Encounter (Signed)
Erin please advise, thanks. 

## 2019-03-09 NOTE — Telephone Encounter (Signed)
Patient called asked if he can get some medicine for the pain in his feet,legs and hands? The number to contact patient is [phone]

## 2019-03-10 MED ORDER — PREDNISONE 10 MG PO TABS: 10.0000 mg | ORAL_TABLET | Freq: Every day | ORAL | 0 refills | Status: DC

## 2019-03-10 NOTE — Telephone Encounter (Signed)
Will call in pred 10 mg to see if this helps

## 2019-03-10 NOTE — Telephone Encounter (Signed)
Patient was called and informed.

## 2019-03-16 ENCOUNTER — Telehealth (INDEPENDENT_AMBULATORY_CARE_PROVIDER_SITE_OTHER): Payer: Self-pay

## 2019-03-17 ENCOUNTER — Telehealth: Payer: Self-pay | Admitting: Orthopedic Surgery

## 2019-03-17 MED ORDER — TRAMADOL HCL 50 MG PO TABS: 50.0000 mg | ORAL_TABLET | Freq: Four times a day (QID) | ORAL | 0 refills | Status: DC | PRN

## 2019-03-17 NOTE — Telephone Encounter (Signed)
Something other than tramadol?

## 2019-03-17 NOTE — Telephone Encounter (Signed)
Pt called in requesting a call back form someone in regards to recommending a different medication for his leg and ankle pain.    [phone]

## 2019-03-17 NOTE — Telephone Encounter (Signed)
Treating pt for right foot ulcer and CTR requesting something for pain. Has used tramadol in past and is wanting something different.

## 2019-03-17 NOTE — Telephone Encounter (Signed)
Tramadol refill requested and he also asked for something for neuropathy c/o pain bilateral feet.

## 2019-03-31 ENCOUNTER — Ambulatory Visit: Payer: Medicare Other | Admitting: Family

## 2019-04-26 ENCOUNTER — Other Ambulatory Visit: Payer: Self-pay | Admitting: Orthopedic Surgery

## 2019-04-26 ENCOUNTER — Telehealth: Payer: Self-pay | Admitting: Orthopedic Surgery

## 2019-04-26 MED ORDER — GABAPENTIN 300 MG PO CAPS: 300.0000 mg | ORAL_CAPSULE | Freq: Three times a day (TID) | ORAL | 3 refills | Status: DC

## 2019-04-26 NOTE — Telephone Encounter (Signed)
Pt called in requesting pain medication for his bilateral feet/knee pain, please have that sent to [facility] and he would also like to have a call back said he has some questions as well.   [phone]

## 2019-04-26 NOTE — Telephone Encounter (Signed)
Patient called stating that he has neuropathy and wanted to know if Dr. Sharol Given would send in some medication for it.  He would also like for you to call him.  [phone].  Thank you.

## 2019-04-26 NOTE — Telephone Encounter (Signed)
Duplicate message I will sign off on this and address what's  in my box.

## 2019-04-26 NOTE — Telephone Encounter (Signed)
Start 1 pill at night increases as needed

## 2019-04-26 NOTE — Telephone Encounter (Signed)
Pt states that he needs something for his neuropathy. He has stinging and burning in feet and has not tried tried anything before per pt report.

## 2019-04-27 NOTE — Telephone Encounter (Signed)
I called pt and advised to take Neurontin 1 po qhs x 1 week then increase if needed to bid x 1 week and then may increase if needed to tid. Pt voiced understanding and will call with any questions.

## 2019-05-05 ENCOUNTER — Telehealth: Payer: Self-pay | Admitting: Orthopedic Surgery

## 2019-05-05 NOTE — Telephone Encounter (Signed)
I called and sw pt and advised of plan below. Pt voiced understanding and he states that he has an appt on Monday and will discuss with Dr. Sharol Given at the time of his visit.

## 2019-05-05 NOTE — Telephone Encounter (Signed)
I called pt and he did not follow the instructions for small increases over the next several weeks he has been taking 300 mg three times a day since Friday. Per Junie Panning to call pt and advise to give more time to build up in system to see if this helps at the current dose. I will call pt to advise.

## 2019-05-05 NOTE — Telephone Encounter (Signed)
Patient called advised the Gabapentin is not working and asked for a call back from [place]. Patient said he has been hurting all night. The number to contact patient is [phone]

## 2019-05-10 ENCOUNTER — Ambulatory Visit: Payer: Medicare Other | Admitting: Physician Assistant

## 2019-05-20 ENCOUNTER — Telehealth: Payer: Self-pay | Admitting: Family

## 2019-05-20 ENCOUNTER — Telehealth: Payer: Self-pay | Admitting: Orthopedic Surgery

## 2019-05-20 NOTE — Telephone Encounter (Signed)
Returned call to patient phone rang with no pickup and no answering machine pickup

## 2019-05-20 NOTE — Telephone Encounter (Signed)
Patient left vm to call. Called pt pack N/A

## 2019-05-26 ENCOUNTER — Other Ambulatory Visit: Payer: Self-pay

## 2019-05-26 ENCOUNTER — Encounter: Payer: Self-pay | Admitting: Family

## 2019-05-26 ENCOUNTER — Ambulatory Visit (INDEPENDENT_AMBULATORY_CARE_PROVIDER_SITE_OTHER): Payer: Medicare Other | Admitting: Family

## 2019-05-26 VITALS — Ht 73.0 in | Wt 185.0 lb

## 2019-05-26 DIAGNOSIS — L97411 Non-pressure chronic ulcer of right heel and midfoot limited to breakdown of skin: Secondary | ICD-10-CM | POA: Diagnosis not present

## 2019-05-26 DIAGNOSIS — Z89432 Acquired absence of left foot: Secondary | ICD-10-CM | POA: Diagnosis not present

## 2019-05-26 DIAGNOSIS — L97421 Non-pressure chronic ulcer of left heel and midfoot limited to breakdown of skin: Secondary | ICD-10-CM | POA: Diagnosis not present

## 2019-05-26 DIAGNOSIS — Z89431 Acquired absence of right foot: Secondary | ICD-10-CM | POA: Diagnosis not present

## 2019-05-26 DIAGNOSIS — E1142 Type 2 diabetes mellitus with diabetic polyneuropathy: Secondary | ICD-10-CM

## 2019-05-26 MED ORDER — PREGABALIN 50 MG PO CAPS: 50.0000 mg | ORAL_CAPSULE | Freq: Three times a day (TID) | ORAL | 0 refills | Status: DC

## 2019-05-26 NOTE — Progress Notes (Signed)
Office Visit Note   Patient: [name]           Date of Birth: 1940/12/29           MRN: [id number] Visit Date: 05/26/2019              Requested by: [name], MD [address],  [place] [address] PCP: [name], MD  Chief Complaint  Patient presents with   Right Foot - Pain   Left Foot - Pain      HPI: Patient is a 78 year old gentleman who presents in follow-up for Wagner grade 1 ulcers to bilateral feet.  He is status post bilateral transmetatarsal amputations.  He has been full weightbearing in regular shoewear.   Dry dressings in place today.  He does report that home health is assisting with the care of his feet.  He does live alone  Complains of neuropathic pain bilateral feet left worse than right.  States is worse at night difficulty sleeping.  He has been taking gabapentin without any relief.  States has not taken Lyrica in the past  Assessment & Plan: Visit Diagnoses:  1. Midfoot skin ulcer, right, limited to breakdown of skin ([place])   2. Midfoot ulcer, left, limited to breakdown of skin ([place])   3. Status post transmetatarsal amputation of left foot ([place])   4. History of transmetatarsal amputation of right foot ([place])   5. Type 2 diabetes mellitus with diabetic polyneuropathy, unspecified whether long term insulin use ([place])     Plan: Calluses debrided as well as wounds debrided.  Iodosorb dressings applied.  He will continue with his home foot care will follow-up in the office in 4 weeks.     Follow-Up Instructions: Return in about 4 weeks (around 06/23/2019).   Ortho Exam  Patient is alert, oriented, no adenopathy, well-dressed, normal affect, normal respiratory effort.  On examination of bilateral lower extremities there is no edema. He has significantly increased callus formation of the left foot beneath the midfoot. there is also a large fissure.  Central fissure is 4 cm long and 3 cm wide there is granulation  in the center there is surrounding epiboly and macerated nonviable tissue.   this is pared of the 10 blade knife back to viable tissue.  Dried blood covering his foot.  Foul odor from buildup of old drainage and blood. there is no active drainage, does have a midfoot plantar callus this is 15 mm in diameter and 1 cm thick this was pared with a 10 blade knife back to viable tissue there is no underlying abscess.  There is no erythema no warmth no swelling to his left foot on the right foot there is a plantar ulcer beneath the medial column distally.  This is 15 mm in diameter central ulceration is 0.5 cm in diameter and 7 mm deep. this was also debrided back to viable tissue there is scant clear drainage.  No surrounding erythema no maceration no sign of infection  Imaging: No results found. No images are attached to the encounter.  Labs: Lab Results  Component Value Date   HGBA1C 5.4 11/01/2018   HGBA1C 5.2 05/29/2018   HGBA1C 5.4 01/30/2018   ESRSEDRATE 34 (H) 12/17/2017   ESRSEDRATE 45 (H) 09/27/2017   ESRSEDRATE 75 (H) 07/28/2017   CRP 17.1 (H) 12/17/2017   CRP 1.8 07/28/2017   CRP 15.8 (H) 09/06/2014   REPTSTATUS 10/25/2013 FINAL 10/24/2013   CULT NO GROWTH Performed at  [facility] 10/24/2013     Lab Results  Component Value Date   ALBUMIN 3.2 (L) 10/31/2018   ALBUMIN 3.5 05/28/2018   ALBUMIN 3.9 09/27/2017    Body mass index is 24.41 kg/m.  Orders:  No orders of the defined types were placed in this encounter.  No orders of the defined types were placed in this encounter.    Procedures: No procedures performed  Clinical Data: No additional findings.  ROS:  All other systems negative, except as noted in the HPI. Review of Systems  Constitutional: Negative for chills and fever.  Cardiovascular: Negative for leg swelling.  Skin: Positive for wound. Negative for color change.    Objective: Vital Signs: Ht 6\' 1"  (1.854 m)    Wt 185 lb (83.9 kg)    BMI  24.41 kg/m   Specialty Comments:  No specialty comments available.  PMFS History: Patient Active Problem List   Diagnosis Date Noted   Midfoot ulcer, left, limited to breakdown of skin ([place]) 12/29/2018   Occipital stroke ([place]) 10/31/2018   DM2 (diabetes mellitus, type 2) ([place]) 10/31/2018   NSTEMI (non-ST elevated myocardial infarction) ([place]) 05/28/2018   Syncope 05/28/2018   HTN (hypertension) 05/28/2018   Hyperlipemia, mixed 05/28/2018   Rash and nonspecific skin eruption 12/16/2017   Pain in right hand 09/11/2017   Carpal tunnel syndrome, right upper limb 07/01/2017   Arthritis of carpometacarpal [facility]) joint of left thumb 12/30/2016   Midfoot skin ulcer, right, limited to breakdown of skin ([place]) 07/22/2016   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[place]) 09/23/2014   Osteomyelitis of ankle or foot, left, acute ([place]) 08/23/2014   Cellulitis 10/12/2012   Chest pain 10/12/2012   Tobacco abuse 10/12/2012   ABSCESS, AXILLA, LEFT 02/23/2008   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   CHEST WALL PAIN, ACUTE 12/03/2007   CANDIDIASIS, GLANS PENIS 09/16/2007   HEADACHE 08/04/2007   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 ([place]) 03/05/2007   Other and unspecified hyperlipidemia 03/05/2007   GOUT 03/05/2007   ERECTILE DYSFUNCTION 03/05/2007   EXTERNAL HEMORRHOIDS 03/05/2007   VENTRAL HERNIA 03/05/2007   FATTY LIVER DISEASE 03/05/2007   Osteoarthritis 03/05/2007   HEMORRHOIDS, INTERNAL 03/17/2005   COLONIC POLYPS, HX OF 03/17/2005   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[place])    Right great toe   Peripheral vascular disease ([place])    diabetic  with osteomylitis   Stroke (cerebrum) ([place]) 10/2018   Type II diabetes mellitus ([place])     Family History  Problem Relation Age of Onset     Diabetes Mellitus II Other    Anesthesia problems Neg Hx     Past Surgical History:  Procedure Laterality Date   AMPUTATION  07/23/2011   Procedure: AMPUTATION DIGIT;  Surgeon: Newt Minion, MD;  Location: [place];  Service: Orthopedics;  Laterality: Right;  Right Great Toe Amputation   AMPUTATION Right 09/18/2012   Procedure: Right Foot 2nd Ray Amputation;  Surgeon: Newt Minion, MD;  Location: [place];  Service: Orthopedics;  Laterality: Right;  Right Foot 2nd Ray Amputation   AMPUTATION Right 10/14/2012   Procedure: AMPUTATION FOOT;  Surgeon: Newt Minion, MD;  Location: [place];  Service: Orthopedics;  Laterality: Right;  Right Foot Transmetatarsal Amputation   AMPUTATION Left 09/23/2014   Procedure: Left  Foot Transmetatarsal Amputation;  Surgeon: Newt Minion, MD;  Location: [place];  Service: Orthopedics;  Laterality: Left;   BACK SURGERY     lower   CARPAL TUNNEL RELEASE Right 01/30/2018   Procedure: RIGHT CARPAL TUNNEL RELEASE;  Surgeon: Newt Minion, MD;  Location: [place];  Service: Orthopedics;  Laterality: Right;   CIRCUMCISION  2010   COLONOSCOPY     FOOT FRACTURE SURGERY Left 1970's   "broke it playing football" (10/12/2012)   [place] Right 1960's   "put a plate in it" (QA348G)   INCISION AND DRAINAGE OF WOUND Left 2006   "foot" (10/12/2012)   [place] SURGERY  2008   Social History   Occupational History   Not on file  Tobacco Use   Smoking status: Current Every Day Smoker    Years: 12.00    Types: Pipe   Smokeless tobacco: Never Used  Substance and Sexual Activity   Alcohol use: No    Alcohol/week: 0.0 standard drinks   Drug use: No   Sexual activity: Not Currently

## 2019-06-02 ENCOUNTER — Telehealth: Payer: Self-pay | Admitting: Orthopedic Surgery

## 2019-06-02 NOTE — Telephone Encounter (Signed)
[name] was called and given verbal okay for orders, lvm to call back if she needs anything else.

## 2019-06-02 NOTE — Telephone Encounter (Signed)
Levada Dy from [place] at [facility] called in verbal orders for patient. Levada Dy requesting continued skilled nursing for twice a week for 9 weeks for wound care bilateral feet Dr. Glennis Brink. Levada Dy phone number is [phone]. If need to call leave detailed message for Levada Dy.

## 2019-06-21 ENCOUNTER — Telehealth: Payer: Self-pay

## 2019-06-21 NOTE — Telephone Encounter (Signed)
Talked with patient concerning an appointment to come into the office sooner due to having a strong odor and an opening on his left foot per [place] with [facility].

## 2019-06-22 ENCOUNTER — Ambulatory Visit (INDEPENDENT_AMBULATORY_CARE_PROVIDER_SITE_OTHER): Payer: Medicare Other | Admitting: Family

## 2019-06-22 ENCOUNTER — Other Ambulatory Visit: Payer: Self-pay

## 2019-06-22 ENCOUNTER — Encounter: Payer: Self-pay | Admitting: Family

## 2019-06-22 VITALS — Ht 73.0 in | Wt 185.0 lb

## 2019-06-22 DIAGNOSIS — Z89431 Acquired absence of right foot: Secondary | ICD-10-CM | POA: Diagnosis not present

## 2019-06-22 DIAGNOSIS — L97411 Non-pressure chronic ulcer of right heel and midfoot limited to breakdown of skin: Secondary | ICD-10-CM

## 2019-06-22 DIAGNOSIS — L97421 Non-pressure chronic ulcer of left heel and midfoot limited to breakdown of skin: Secondary | ICD-10-CM

## 2019-06-22 DIAGNOSIS — E1142 Type 2 diabetes mellitus with diabetic polyneuropathy: Secondary | ICD-10-CM

## 2019-06-22 DIAGNOSIS — Z89432 Acquired absence of left foot: Secondary | ICD-10-CM

## 2019-06-22 NOTE — Progress Notes (Signed)
Office Visit Note   Patient: [name]           Date of Birth: 08-09-1940           MRN: [id number] Visit Date: 06/22/2019              Requested by: [name], MD [address] [place],  [place] [address] PCP: [name], MD  Chief Complaint  Patient presents with  . Right Foot - Follow-up  . Left Foot - Follow-up      HPI: Patient is a 79 year old gentleman who presents in follow-up for Wagner grade 1 ulcers to bilateral feet.  He is status post bilateral transmetatarsal amputations.  He has been full weightbearing in regular shoewear. HH was out yesterday for wound care.   Complains of neuropathic pain bilateral feet left worse than right.  States is worse at night difficulty sleeping.  He has been taking gabapentin without any relief.  States has not taken Lyrica in the past  Assessment & Plan: Visit Diagnoses:  1. Midfoot skin ulcer, right, limited to breakdown of skin ([place])   2. Midfoot ulcer, left, limited to breakdown of skin ([place])   3. Status post transmetatarsal amputation of left foot ([place])   4. History of transmetatarsal amputation of right foot ([place])   5. Type 2 diabetes mellitus with diabetic polyneuropathy, unspecified whether long term insulin use ([place])     Plan: Calluses debrided as well as wounds debrided of nonviable tissue. Mupirocin dressings applied.  He will continue with his home foot care will follow-up in the office in 6 weeks.     Follow-Up Instructions: Return in about 6 weeks (around 08/03/2019).   Ortho Exam  Patient is alert, oriented, no adenopathy, well-dressed, normal affect, normal respiratory effort.  On examination of bilateral lower extremities there is no edema. He has significantly increased callus formation of the left foot beneath the midfoot. there is also a large fissure.  Central fissure is 3 cm long and 2 cm wide there is granulation in the center there is surrounding epiboly and much less  macerated nonviable tissue.   this is pared of the 10 blade knife back to viable tissue. there is no active drainage, does have a midfoot plantar callus this is 15 mm in diameter and 1 cm thick this was pared with a 10 blade knife back to viable tissue there is no underlying abscess.  There is no erythema no warmth no swelling to his left foot on the right foot there is a plantar ulcer beneath the medial column distally.  This is 15 mm in diameter. 10 mm deep. Surrounding maceration and epibole. this was also debrided back to viable tissue no drainage.  No surrounding erythema no maceration no sign of infection  Imaging: No results found. No images are attached to the encounter.  Labs: Lab Results  Component Value Date   HGBA1C 5.4 11/01/2018   HGBA1C 5.2 05/29/2018   HGBA1C 5.4 01/30/2018   ESRSEDRATE 34 (H) 12/17/2017   ESRSEDRATE 45 (H) 09/27/2017   ESRSEDRATE 75 (H) 07/28/2017   CRP 17.1 (H) 12/17/2017   CRP 1.8 07/28/2017   CRP 15.8 (H) 09/06/2014   REPTSTATUS 10/25/2013 FINAL 10/24/2013   CULT NO GROWTH Performed at [facility] 10/24/2013     Lab Results  Component Value Date   ALBUMIN 3.2 (L) 10/31/2018   ALBUMIN 3.5 05/28/2018   ALBUMIN 3.9 09/27/2017    Body mass index is 24.[place]  kg/m.  Orders:  No orders of the defined types were placed in this encounter.  No orders of the defined types were placed in this encounter.    Procedures: No procedures performed  Clinical Data: No additional findings.  ROS:  All other systems negative, except as noted in the HPI. Review of Systems  Constitutional: Negative for chills and fever.  Cardiovascular: Negative for leg swelling.  Skin: Positive for wound. Negative for color change.    Objective: Vital Signs: Ht 6\' 1"  (1.854 m)   Wt 185 lb (83.9 kg)   BMI 24.41 kg/m   Specialty Comments:  No specialty comments available.  PMFS History: Patient Active Problem List   Diagnosis Date Noted  . Midfoot  ulcer, left, limited to breakdown of skin ([place]) 12/29/2018  . Occipital stroke ([place]) 10/31/2018  . DM2 (diabetes mellitus, type 2) ([place]) 10/31/2018  . NSTEMI (non-ST elevated myocardial infarction) ([place]) 05/28/2018  . Syncope 05/28/2018  . HTN (hypertension) 05/28/2018  . Hyperlipemia, mixed 05/28/2018  . Rash and nonspecific skin eruption 12/16/2017  . Pain in right hand 09/11/2017  . Carpal tunnel syndrome, right upper limb 07/01/2017  . Arthritis of carpometacarpal [facility]) joint of left thumb 12/30/2016  . Midfoot skin ulcer, right, limited to breakdown of skin ([place]) 07/22/2016  . 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[place]) 09/23/2014  . Osteomyelitis of ankle or foot, left, acute ([place]) 08/23/2014  . Cellulitis 10/12/2012  . Chest pain 10/12/2012  . Tobacco abuse 10/12/2012  . ABSCESS, AXILLA, LEFT 02/23/2008  . 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 CHEST WALL PAIN, ACUTE 12/03/2007  . CANDIDIASIS, GLANS PENIS 09/16/2007  . HEADACHE 08/04/2007  . 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 Other and unspecified hyperlipidemia 03/05/2007  . GOUT 03/05/2007  . ERECTILE DYSFUNCTION 03/05/2007  . EXTERNAL HEMORRHOIDS 03/05/2007  . VENTRAL HERNIA 03/05/2007  . FATTY LIVER DISEASE 03/05/2007  . Osteoarthritis 03/05/2007  . HEMORRHOIDS, INTERNAL 03/17/2005  . COLONIC POLYPS, HX OF 03/17/2005   Past Medical History:  Diagnosis Date  . Arthritis   . Diabetic neuropathy ([place])   . High cholesterol   . Hypertension    denies  . Osteomyelitis (HCC)    Right great toe  . Peripheral vascular disease ([place])    diabetic  with osteomylitis  . Stroke (cerebrum) ([place]) 10/2018  . Type II diabetes mellitus (HCC)     Family History  Problem Relation Age of Onset  . Diabetes Mellitus II Other   . Anesthesia problems Neg Hx     Past Surgical History:  Procedure Laterality Date  . AMPUTATION  07/23/2011    Procedure: AMPUTATION DIGIT;  Surgeon: Newt Minion, MD;  Location: [place];  Service: Orthopedics;  Laterality: Right;  Right Great Toe Amputation  . AMPUTATION Right 09/18/2012   Procedure: Right Foot 2nd Ray Amputation;  Surgeon: Newt Minion, MD;  Location: [place];  Service: Orthopedics;  Laterality: Right;  Right Foot 2nd Ray Amputation  . AMPUTATION Right 10/14/2012   Procedure: AMPUTATION FOOT;  Surgeon: Newt Minion, MD;  Location: [place];  Service: Orthopedics;  Laterality: Right;  Right Foot Transmetatarsal Amputation  . AMPUTATION Left 09/23/2014   Procedure: Left Foot Transmetatarsal Amputation;  Surgeon: Newt Minion, MD;  Location: [place];  Service: Orthopedics;  Laterality: Left;  . BACK SURGERY     lower  . CARPAL TUNNEL RELEASE Right 01/30/2018   Procedure: RIGHT CARPAL TUNNEL  RELEASE;  Surgeon: Newt Minion, MD;  Location: [place];  Service: Orthopedics;  Laterality: Right;  . CIRCUMCISION  2010  . COLONOSCOPY    . FOOT FRACTURE SURGERY Left 1970's   "broke it playing football" (10/12/2012)  . HUMERUS FRACTURE SURGERY W/ IMPLANT Right 1960's   "put a plate in it" (QA348G)  . INCISION AND DRAINAGE OF WOUND Left 2006   "foot" (10/12/2012)  . LUMBAR [place] SURGERY  2008   Social History   Occupational History  . Not on file  Tobacco Use  . Smoking status: Current Every Day Smoker    Years: 12.00    Types: Pipe  . Smokeless tobacco: Never Used  Substance and Sexual Activity  . Alcohol use: No    Alcohol/week: 0.0 standard drinks  . Drug use: No  . Sexual activity: Not Currently

## 2019-06-23 ENCOUNTER — Ambulatory Visit: Payer: Medicare Other | Admitting: Family

## 2019-07-01 ENCOUNTER — Other Ambulatory Visit: Payer: Self-pay | Admitting: Family

## 2019-07-01 NOTE — Telephone Encounter (Signed)
Do you wish to refill? 

## 2019-07-02 ENCOUNTER — Telehealth: Payer: Self-pay | Admitting: Orthopedic Surgery

## 2019-07-02 NOTE — Telephone Encounter (Signed)
Pt called in about his medication Pregablin (LYRICA) that was prescribed to him. Pt is wanting to know what exactly that medication is for and directions on how to take the medication.  Please give him a call   [phone]

## 2019-07-06 NOTE — Telephone Encounter (Signed)
Do you want to call this pt to discuss? He is on Lyrica 50 mg tid and did not take it as per the last note because he was unsure what it was. This was discussed and the pt has additional questions.

## 2019-07-20 ENCOUNTER — Telehealth: Payer: Self-pay | Admitting: Family

## 2019-07-20 MED ORDER — GABAPENTIN 300 MG PO CAPS: 300.0000 mg | ORAL_CAPSULE | Freq: Three times a day (TID) | ORAL | 3 refills | Status: DC

## 2019-07-20 NOTE — Telephone Encounter (Signed)
Patient called needing Rx refilled (Gabapentin) Patient said the Gabapentin is not working and can he get something else. The number to contact patient is [phone]

## 2019-07-20 NOTE — Telephone Encounter (Signed)
I am not sure what you could offer the pt he has had Neurontin and lyrica and had multiple discussions about medication with pt not sure what you can offer.

## 2019-07-21 ENCOUNTER — Encounter (HOSPITAL_BASED_OUTPATIENT_CLINIC_OR_DEPARTMENT_OTHER): Payer: Medicare Other | Admitting: Physician Assistant

## 2019-07-21 ENCOUNTER — Other Ambulatory Visit: Payer: Self-pay

## 2019-07-21 DIAGNOSIS — E1140 Type 2 diabetes mellitus with diabetic neuropathy, unspecified: Secondary | ICD-10-CM | POA: Diagnosis not present

## 2019-07-21 DIAGNOSIS — I10 Essential (primary) hypertension: Secondary | ICD-10-CM | POA: Diagnosis not present

## 2019-07-21 DIAGNOSIS — F17290 Nicotine dependence, other tobacco product, uncomplicated: Secondary | ICD-10-CM | POA: Insufficient documentation

## 2019-07-21 DIAGNOSIS — L97512 Non-pressure chronic ulcer of other part of right foot with fat layer exposed: Secondary | ICD-10-CM | POA: Insufficient documentation

## 2019-07-21 DIAGNOSIS — E1151 Type 2 diabetes mellitus with diabetic peripheral angiopathy without gangrene: Secondary | ICD-10-CM | POA: Insufficient documentation

## 2019-07-21 DIAGNOSIS — L84 Corns and callosities: Secondary | ICD-10-CM | POA: Insufficient documentation

## 2019-07-21 DIAGNOSIS — E11621 Type 2 diabetes mellitus with foot ulcer: Secondary | ICD-10-CM | POA: Diagnosis present

## 2019-07-21 DIAGNOSIS — L97522 Non-pressure chronic ulcer of other part of left foot with fat layer exposed: Secondary | ICD-10-CM | POA: Insufficient documentation

## 2019-07-21 NOTE — Progress Notes (Addendum)
CRIXUS, HOUT ([id number]) Visit Report for 07/21/2019 Chief Complaint Document Details Patient Name: Date of Service: [name], [name] 07/21/2019 2:45 PM Medical Record Number:[id number] Patient Account Number: [id number] Date of Birth/Sex: Treating RN: 1940-07-30 (79 y.o. M) Primary Care Provider: [name] Other Clinician: Referring Provider: Treating Provider/Extender:Stone III, Jacqualyn Posey, [name] Weeks in Treatment: 0 Information Obtained from: Patient Chief Complaint Bilateral foot ulcers Electronic Signature(s) Signed: 07/21/2019 4:48:25 PM By: Worthy Keeler PA-C Previous Signature: 07/21/2019 3:54:32 PM Version By: Worthy Keeler PA-C Entered By: Worthy Keeler on 07/21/2019 16:48:24 -------------------------------------------------------------------------------- Debridement Details Patient Name: Date of Service: [name], [name] 07/21/2019 2:45 PM Medical Record [id number] Patient Account Number: [id number] Date of Birth/Sex: Treating RN: 14-Jun-1941 (78 y.o. [name] Primary Care Provider: [name] Other Clinician: Referring Provider: Treating Provider/Extender:Stone III, Jacqualyn Posey, [name] Weeks in Treatment: 0 Debridement Performed for Wound #22 Right Metatarsal head first Assessment: Performed By: Physician Worthy Keeler, PA Debridement Type: Debridement Severity of Tissue Pre Fat layer exposed Debridement: Level of Consciousness (Pre- Awake and Alert procedure): Pre-procedure Verification/Time Out Taken: Yes - 16:55 Start Time: 16:57 Pain Control: Lidocaine 4% Topical Solution Total Area Debrided (L x W): 1 (cm) x 0.8 (cm) = 0.8 (cm) Tissue and other material Viable, Non-Viable, Callus, Slough, Subcutaneous, Skin: Epidermis, Slough debrided: Level: Skin/Subcutaneous Tissue Debridement Description: Excisional Instrument: Curette Bleeding: Minimum Hemostasis Achieved: Pressure End Time: 17:02 Procedural  Pain: 0 Post Procedural Pain: 0 Response to Treatment: Procedure was tolerated well Level of Consciousness Awake and Alert (Post-procedure): Post Debridement Measurements of Total Wound Length: (cm) 2.5 Width: (cm) 2.5 Depth: (cm) 0.3 Volume: (cm) 1.473 Character of Wound/Ulcer Post Improved Debridement: Severity of Tissue Post Debridement: Fat layer exposed Post Procedure Diagnosis Same as Pre-procedure Electronic Signature(s) Signed: 07/21/2019 6:36:45 PM By: Baruch Gouty RN, BSN Signed: 07/21/2019 7:52:58 PM By: Worthy Keeler PA-C Entered By: Baruch Gouty on 07/21/2019 17:00:05 -------------------------------------------------------------------------------- Debridement Details Patient Name: Date of Service: [name], [name] 07/21/2019 2:45 PM Medical Record [id number] Patient Account Number: [id number] Date of Birth/Sex: Treating RN: 28-Mar-1941 (79 y.o. [name] Primary Care Provider: [name] Other Clinician: Referring Provider: Treating Provider/Extender:Stone III, Jacqualyn Posey, [name] Weeks in Treatment: 0 Debridement Performed for Wound #21 Left,Plantar Foot Assessment: Performed By: Physician Worthy Keeler, PA Debridement Type: Debridement Severity of Tissue Pre Fat layer exposed Debridement: Level of Consciousness (Pre- Awake and Alert procedure): Pre-procedure Verification/Time Out Taken: Yes - 16:55 Start Time: 17:03 Pain Control: Lidocaine 4% Topical Solution Total Area Debrided (L x W): 5 (cm) x 3 (cm) = 15 (cm) Tissue and other material Viable, Non-Viable, Callus, Slough, Subcutaneous, Skin: Epidermis, Slough Viable, Non-Viable, Callus, Slough, Subcutaneous, Skin: Epidermis, Slough debrided: Level: Skin/Subcutaneous Tissue Debridement Description: Excisional Instrument: Curette Bleeding: Minimum Hemostasis Achieved: Pressure End Time: 17:10 Procedural Pain: 0 Post Procedural Pain: 0 Response to Treatment:  Procedure was tolerated well Level of Consciousness Awake and Alert (Post-procedure): Post Debridement Measurements of Total Wound Length: (cm) 4.2 Width: (cm) 0.7 Depth: (cm) 0.5 Volume: (cm) 1.155 Character of Wound/Ulcer Post Improved Debridement: Severity of Tissue Post Debridement: Fat layer exposed Post Procedure Diagnosis Same as Pre-procedure Electronic Signature(s) Signed: 07/21/2019 6:36:45 PM By: Baruch Gouty RN, BSN Signed: 07/21/2019 7:52:58 PM By: Worthy Keeler PA-C Entered By: Baruch Gouty on 07/21/2019 17:07:40 -------------------------------------------------------------------------------- HPI Details Patient Name: Date of Service: [name], [name] 07/21/2019 2:45 PM Medical Record [id number] Patient Account Number: [id number] Date of Birth/Sex: Treating RN: 1941/04/13 (79 y.o. M) Primary Care Provider: [name] Other  Clinician: Referring Provider: Treating Provider/Extender:Stone III, Jacqualyn Posey, [name] Weeks in Treatment: 0 History of Present Illness HPI Description: very pleasant 79 year old gentleman has type 2 diabetes and has been having an ulcer on his foot for several years. Been seeing as this time around since the end of October. Classified as a Wagner grade 2 because his previous x-rays did not show any problems. He has an MRI pending this Friday. discussing with him he says he has not been able to get his diabetic shoes yet. He continues to smoke his pipe and drinks moonshine and says he will not give this up. I have tried to counsel him regarding this but he firmly says that he is happy to listen to me but he is not going to give up his habits. 08/17/14 -- patient has no fresh complaints but has recently come with his MRI which was done on 08/12/2014. 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 Readmission: 07/21/2019  upon evaluation today patient presents for initial inspection here in our clinic concerning issues that he has been having with his bilateral feet. He has open wounds that been present at least since the beginning of the year although he does not know an exact time. He is previously had amputations of all toes and the distal portion of his foot. 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 With that being said he does have a history of diabetes, peripheral vascular disease, nicotine dependence, and hypertension. He has not had any recent arterial or vascular studies he was noncompressible today I think he is going to require referral for arterial studies as well. Electronic Signature(s) Signed: 07/21/2019 6:27:22 PM By: Worthy Keeler PA-C Entered By: Worthy Keeler on 07/21/2019 18:27:22 -------------------------------------------------------------------------------- Paring/cutting 1 benign hyperkeratotic lesion Details Patient Name: Date of Service: [name], [name] 07/21/2019 2:45 PM Medical Record Number:[id number] Patient Account Number: [id number] Date of Birth/Sex: Treating RN: 09-29-1940 (79 y.o. [name] Primary Care Provider: [name] Other Clinician: Referring Provider: Treating Provider/Extender:Stone III, Jacqualyn Posey, [name] Weeks in Treatment: 0 Procedure Performed for: Non-Wound Location Performed By: Physician Worthy Keeler, PA Post Procedure Diagnosis Same as Pre-procedure Notes left proximal plantar foot using #3 curette Electronic Signature(s) Signed: 07/21/2019 6:36:45 PM By: Baruch Gouty RN, BSN Signed: 07/21/2019 7:52:58 PM By: Worthy Keeler PA-C Entered By: Baruch Gouty on 07/21/2019 17:12:33 -------------------------------------------------------------------------------- Physical Exam Details Patient Name: Date of Service: [name], [name] 07/21/2019 2:45 PM Medical Record [id number] Patient Account  Number: [id number] Date of Birth/Sex: Treating RN: 05-03-1941 (79 y.o. M) Primary Care Provider: [name] Other Clinician: Referring Provider: Treating Provider/Extender:Stone III, Jacqualyn Posey, [name] Weeks in Treatment: 0 Constitutional patient is hypertensive.. pulse regular and within target range for patient.Marland Kitchen respirations regular, non-labored and within target range for patient.Marland Kitchen temperature within target range for patient.. Well-nourished and well-hydrated in no acute distress. Eyes conjunctiva clear no eyelid edema noted. pupils equal round and reactive to light and accommodation. Ears, Nose, Mouth, and Throat no gross abnormality of ear auricles or external auditory canals. normal hearing noted during conversation. mucus membranes moist. Respiratory normal breathing without difficulty. Cardiovascular 2+ dorsalis pedis/posterior tibialis pulses. no clubbing, cyanosis, significant edema, <3 sec cap refill. Gastrointestinal (GI) soft, non-tender, non-distended, +BS. no ventral hernia noted. Musculoskeletal normal gait and posture. no significant deformity or arthritic changes, no loss or range of motion, no clubbing. Psychiatric this patient is able to make decisions  and demonstrates good insight into disease process. Alert and Oriented x 3. pleasant and cooperative. Notes Upon inspection today patient's wounds actually did not appear to be too bad although he had significant callus buildup around both ulcers on the plantar aspect of his foot. He also had some issues with a callus on the proximal portion of his plantar foot on the left that also required paring. I did perform debridement and callus paring at all locations today in order to clean this away and help to improve the overall status of his feet and specifically the healing of the wounds. He tolerated this without complication there was no significant bleeding although we were able to control this  with pressure. Fortunately there is no signs of active infection at this time which is also good news I think the main issue here has been that he really has not been able to allow these areas to have a good dressing on them he has been using mupirocin as recommended by Dr. Sharol Given and the providers at his office and I think that was providing too much moisture I think really what he needs is something to dry this up I believe an alginate dressing would likely be a good option here. Electronic Signature(s) Signed: 07/21/2019 6:28:55 PM By: Worthy Keeler PA-C Entered By: Worthy Keeler on 07/21/2019 18:28:55 -------------------------------------------------------------------------------- Physician Orders Details Patient Name: Date of Service: [name], [name] 07/21/2019 2:45 PM Medical Record Number:[id number] Patient Account Number: [id number] Date of Birth/Sex: Treating RN: 1940-06-22 (79 y.o. [name] Primary Care Provider: [name] Other Clinician: Referring Provider: Treating Provider/Extender:Stone III, Jacqualyn Posey, [name] Weeks in Treatment: 0 Verbal / Phone Orders: No Diagnosis Coding ICD-10 Coding Code Description E11.621 Type 2 diabetes mellitus with foot ulcer L97.522 Non-pressure chronic ulcer of other part of left foot with fat layer exposed L97.512 Non-pressure chronic ulcer of other part of right foot with fat layer exposed I73.89 Other specified peripheral vascular diseases F17.218 Nicotine dependence, cigarettes, with other nicotine-induced disorders I10 Essential (primary) hypertension Follow-up Appointments Return Appointment in 1 week. Dressing Change Frequency Wound #21 Left,Plantar Foot Change dressing three times week. Wound #22 Right Metatarsal head first Change dressing three times week. Wound Cleansing Clean wound with Wound Cleanser May shower with protection. Primary Wound Dressing Wound #21 Left,Plantar Foot Calcium  Alginate with Silver Wound #22 Right Metatarsal head first Calcium Alginate with Silver Secondary Dressing Wound #21 Left,Plantar Foot Foam - foam donut to offload Kerlix/Rolled Gauze Dry Gauze ABD pad - as needed Wound #22 Right Metatarsal head first Foam - foam donut to offload Kerlix/Rolled Gauze Dry Gauze ABD pad - as needed [facility] for wound care. - [facility] X-ray, foot left complete view - diabetic foot ulcer left plantar foot [id number] - (ICD10 L97.522 - Non- pressure chronic ulcer of other part of left foot with fat layer exposed) X-ray, foot right complete view - diabetic foot ulcer right 1st metatarsal head CPT 73630 - (ICD10 L97.512 - Non-pressure chronic ulcer of other part of right foot with fat layer exposed) Services and Therapies Arterial Studies- Bilateral with ABIs - noncompressible DP pulses in clinic, nonhealing diabetic foot ulcers bilateral feet - (ICD10 E11.621 - Type 2 diabetes mellitus with foot ulcer) Electronic Signature(s) Signed: 07/21/2019 6:36:45 PM By: Baruch Gouty RN, BSN Signed: 07/21/2019 7:52:58 PM By: Worthy Keeler PA-C Entered By: Baruch Gouty on 07/21/2019 17:45:36 -------------------------------------------------------------------------------- Prescription 07/21/2019 Patient Name: Ladon Applebaum Provider: Worthy Keeler PA Date  of Birth: [birth year] NPI#: [id number] Sex: Valeta Harms: [id number] Phone #: [phone] License #: Patient Address: [address] [place], [place] [address] [place], [place] [address] [phone] Allergies No Known Drug Allergies Provider's Orders X-ray, foot left complete view - ICD10: L97.522 - diabetic foot ulcer left plantar foot [id number] Signature(s): Date(s): Prescription 07/21/2019 Patient Name: Ladon Applebaum Provider: Worthy Keeler PA Date of Birth: [birth year] NPI#:  [id number] Sex: [name] DEA#: [id number] Phone #: [phone] License #: Patient Address: [address] [place], [place] [address] [place], [place] [address] [phone] Allergies No Known Drug Allergies Provider's Orders X-ray, foot right complete view - ICD10: L97.512 - diabetic foot ulcer right 1st metatarsal head CPT [id number] Signature(s): Date(s): Prescription 07/21/2019 Patient Name: Ladon Applebaum Provider: Worthy Keeler PA Date of Birth: 12/23/1940 NPI#: [id number] Sex: [name] DEA#: [id number] Phone #: [phone] License #: Patient Address: [address] [place], [place] [address] [place], [place] [address] [phone] Allergies No Known Drug Allergies Provider's Orders Arterial Studies- Bilateral with ABIs - ICD10: E11.621 - noncompressible DP pulses in clinic, nonhealing diabetic foot ulcers bilateral feet Signature(s): Date(s): Electronic Signature(s) Signed: 07/21/2019 6:36:45 PM By: Baruch Gouty RN, BSN Signed: 07/21/2019 7:52:58 PM By: Worthy Keeler PA-C Entered By: Baruch Gouty on 07/21/2019 17:45:37 --------------------------------------------------------------------------------  Problem List Details Patient Name: Date of Service: CHARLI, CURLESS 07/21/2019 2:45 PM Medical Record [id number] Patient Account Number: [id number] Date of Birth/Sex: Treating RN: August 28, 1940 (79 y.o. M) Primary Care Provider: [name] Other Clinician: Referring Provider: Treating Provider/Extender:Stone III, Jacqualyn Posey, [name] Weeks in Treatment: 0 Active Problems ICD-10 Evaluated Encounter Code Description Active Date Today Diagnosis E11.621 Type 2 diabetes mellitus with foot ulcer 07/21/2019 No Yes L97.522 Non-pressure chronic ulcer of other part of left foot 07/21/2019 No Yes with fat layer  exposed L97.512 Non-pressure chronic ulcer of other part of right foot 07/21/2019 No Yes with fat layer exposed I73.89 Other specified peripheral vascular diseases 07/21/2019 No Yes F17.218 Nicotine dependence, cigarettes, with other nicotine- 07/21/2019 No Yes induced disorders I10 Essential (primary) hypertension 07/21/2019 No Yes L84 Corns and callosities 07/21/2019 No Yes Inactive Problems Resolved Problems Electronic Signature(s) Signed: 07/21/2019 6:30:52 PM By: Worthy Keeler PA-C Previous Signature: 07/21/2019 4:48:03 PM Version By: Worthy Keeler PA-C Previous Signature: 07/21/2019 3:54:25 PM Version By: Worthy Keeler PA-C Entered By: Worthy Keeler on 07/21/2019 18:30:51 -------------------------------------------------------------------------------- Progress Note Details Patient Name: Date of Service: VIHAANREDDY, FEDEROWICZ 07/21/2019 2:45 PM Medical Record [id number] Patient Account Number: [id number] Date of Birth/Sex: Treating RN: 1940/12/14 (79 y.o. M) Primary Care Provider: [name] Other Clinician: Referring Provider: Treating Provider/Extender:Stone III, Jacqualyn Posey, [name] Weeks in Treatment: 0 Subjective Chief Complaint Information obtained from Patient Bilateral foot ulcers History of Present Illness (HPI) very pleasant 79 year old gentleman has type 2 diabetes and has been having an ulcer on his foot for several years. Been seeing as this time around since the end of October. Classified as a Wagner grade 2 because his previous x- rays did not show any problems. He has an MRI pending this Friday. discussing with him he says he has not been able to get his diabetic shoes yet. He continues to smoke his pipe and drinks moonshine and says he will not give this up. I have tried to counsel him  regarding this but he firmly says that he is happy to listen to me but he is not going to give up his habits. 08/17/14 -- patient has no fresh complaints but has  recently come with his MRI which was done on 08/12/2014. 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 Readmission: 07/21/2019 upon evaluation today patient presents for initial inspection here in our clinic concerning issues that he has been having with his bilateral feet. He has open wounds that been present at least since the beginning of the year although he does not know an exact time. He is previously had amputations of all toes and the distal portion of his foot. 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 With that being said he does have a history of diabetes, peripheral vascular disease, nicotine dependence, and hypertension. He has not had any recent arterial or vascular studies he was noncompressible today I think he is going to require referral for arterial studies as well. Patient History Information obtained from Patient. Allergies No Known Drug Allergies Family History No family history of Cancer, Diabetes, Heart Disease, Hereditary Spherocytosis, Hypertension, Kidney Disease, Lung Disease, Seizures, Stroke, Thyroid Problems, Tuberculosis. Social History Current every day smoker - pipe, Marital Status - Single, Alcohol Use - Moderate - 1/2 moonshine per week, Drug Use - No History, Caffeine Use - Moderate. Medical History Eyes Patient has history of Cataracts - both removed, Glaucoma - both eyes Denies history of Optic Neuritis Ear/Nose/Mouth/Throat Denies history of Chronic sinus problems/congestion, Middle ear problems Hematologic/Lymphatic Denies history of Anemia, Hemophilia, Human Immunodeficiency Virus, Lymphedema, Sickle Cell Disease Respiratory Denies history of Aspiration, Asthma, Chronic Obstructive Pulmonary Disease (COPD), Pneumothorax, Sleep Apnea, Tuberculosis Cardiovascular Patient has history of Hypertension, Peripheral Arterial  Disease Denies history of Angina, Arrhythmia, Congestive Heart Failure, Coronary Artery Disease, Deep Vein Thrombosis, Hypotension, Myocardial Infarction, Peripheral Venous Disease, Phlebitis, Vasculitis Gastrointestinal Denies history of Cirrhosis , Colitis, Crohnoos, Hepatitis A, Hepatitis B, Hepatitis C Endocrine Patient has history of Type II Diabetes Denies history of Type I Diabetes Genitourinary Denies history of End Stage Renal Disease Immunological Denies history of Lupus Erythematosus, Raynaudoos, Scleroderma Integumentary (Skin) Denies history of History of Burn Musculoskeletal Patient has history of Osteomyelitis - right foot Denies history of Gout, Rheumatoid Arthritis, Osteoarthritis Neurologic Patient has history of Neuropathy Denies history of Dementia, Quadriplegia, Paraplegia, Seizure Disorder Oncologic Denies history of Received Chemotherapy, Received Radiation Psychiatric Denies history of Anorexia/bulimia, Confinement Anxiety Patient is treated with Oral Agents. Blood sugar is not tested. Hospitalization/Surgery History - back surgery x2. - right great toe amputation. - right transmet amputation. - left cataract removed. Medical And Surgical History Notes Cardiovascular hyperlipidemia Review of Systems (ROS) Constitutional Symptoms (General Health) Denies complaints or symptoms of Fatigue, Fever, Chills, Marked Weight Change. Ear/Nose/Mouth/Throat Denies complaints or symptoms of Chronic sinus problems or rhinitis. Respiratory Denies complaints or symptoms of Chronic or frequent coughs, Shortness of Breath. Cardiovascular Denies complaints or symptoms of Chest pain. Gastrointestinal Denies complaints or symptoms of Frequent diarrhea, Nausea, Vomiting. Endocrine Denies complaints or symptoms of Heat/cold intolerance. Genitourinary Denies complaints or symptoms of Frequent urination. Integumentary (Skin) Complains or has symptoms of Wounds - both  feet. Musculoskeletal Denies complaints or symptoms of Muscle Pain, Muscle Weakness. Neurologic Denies complaints or symptoms of Numbness/parasthesias. Psychiatric Denies complaints or symptoms of Claustrophobia, Suicidal. Objective Constitutional patient is hypertensive.. pulse regular and within target range for patient.Marland Kitchen respirations regular, non-labored  and within target range for patient.Marland Kitchen temperature within target range for patient.. Well-nourished and well-hydrated in no acute distress. Vitals Time Taken: 3:45 PM, Height: 73 in, Source: Stated, Weight: 180 lbs, Source: Stated, BMI: 23.7, Temperature: 98.4 F, Pulse: 72 bpm, Respiratory Rate: 20 breaths/min, Blood Pressure: 147/71 mmHg. Eyes conjunctiva clear no eyelid edema noted. pupils equal round and reactive to light and accommodation. Ears, Nose, Mouth, and Throat no gross abnormality of ear auricles or external auditory canals. normal hearing noted during conversation. mucus membranes moist. Respiratory normal breathing without difficulty. Cardiovascular 2+ dorsalis pedis/posterior tibialis pulses. no clubbing, cyanosis, significant edema, Gastrointestinal (GI) soft, non-tender, non-distended, +BS. no ventral hernia noted. Musculoskeletal normal gait and posture. no significant deformity or arthritic changes, no loss or range of motion, no clubbing. Psychiatric this patient is able to make decisions and demonstrates good insight into disease process. Alert and Oriented x 3. pleasant and cooperative. General Notes: Upon inspection today patient's wounds actually did not appear to be too bad although he had significant callus buildup around both ulcers on the plantar aspect of his foot. He also had some issues with a callus on the proximal portion of his plantar foot on the left that also required paring. I did perform debridement and callus paring at all locations today in order to clean this away and help to improve the  overall status of his feet and specifically the healing of the wounds. He tolerated this without complication there was no significant bleeding although we were able to control this with pressure. Fortunately there is no signs of active infection at this time which is also good news I think the main issue here has been that he really has not been able to allow these areas to have a good dressing on them he has been using mupirocin as recommended by Dr. Sharol Given and the providers at his office and I think that was providing too much moisture I think really what he needs is something to dry this up I believe an alginate dressing would likely be a good option here. Integumentary (Hair, Skin) Wound #21 status is Open. Original cause of wound was Gradually Appeared. The wound is located on the [place]. The wound measures 4.2cm length x 0.7cm width x 0.5cm depth; 2.309cm^2 area and 1.155cm^3 volume. There is Fat Layer (Subcutaneous Tissue) Exposed exposed. There is no tunneling or undermining noted. There is a medium amount of serosanguineous drainage noted. The wound margin is thickened. There is large (67- 100%) pink granulation within the wound bed. There is a small (1-33%) amount of necrotic tissue within the wound bed including Adherent Slough. Wound #22 status is Open. Original cause of wound was Gradually Appeared. The wound is located on the Right Metatarsal head first. The wound measures 1cm length x 0.8cm width x 0.3cm depth; 0.628cm^2 area and 0.188cm^3 volume. There is Fat Layer (Subcutaneous Tissue) Exposed exposed. There is no tunneling or undermining noted. There is a medium amount of serosanguineous drainage noted. The wound margin is thickened. There is large (67-100%) pink granulation within the wound bed. There is a small (1-33%) amount of necrotic tissue within the wound bed including Adherent Slough. 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 There was a Excisional Skin/Subcutaneous Tissue Debridement with a total area of 15 sq cm performed by Worthy Keeler, PA. With the following instrument(s): Curette to remove Viable and Non-Viable tissue/material. Material removed includes Callus, Subcutaneous Tissue, Slough, and Skin: Epidermis after achieving pain control using Lidocaine 4% Topical Solution. No specimens were taken. A time out was conducted at 16:55, prior to the start of the procedure. A Minimum amount of bleeding was controlled with Pressure. The procedure was tolerated well with a pain level of 0 throughout and a pain level of 0 following the procedure. Post Debridement Measurements: 4.2cm length x 0.7cm width x 0.5cm depth; 1.155cm^3 volume. Character of Wound/Ulcer Post Debridement is improved. Severity of Tissue Post Debridement is: Fat layer exposed. 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 There was a Excisional Skin/Subcutaneous Tissue Debridement with a total area of 0.8 sq cm performed by Worthy Keeler, PA. With the following instrument(s): Curette to remove Viable and Non-Viable tissue/material. Material removed includes Callus, Subcutaneous Tissue, Slough, and Skin:  Epidermis after achieving pain control using Lidocaine 4% Topical Solution. No specimens were taken. A time out was conducted at 16:55, prior to the start of the procedure. A Minimum amount of bleeding was controlled with Pressure. The procedure was tolerated well with a pain level of 0 throughout and a pain level of 0 following the procedure. Post Debridement Measurements: 2.5cm length x 2.5cm width x 0.3cm depth; 1.473cm^3 volume. Character of Wound/Ulcer Post Debridement is improved. Severity of Tissue Post Debridement is: Fat layer exposed. Post procedure Diagnosis Wound #22: Same as Pre-Procedure A Paring/cutting 1 benign hyperkeratotic lesion procedure was performed. by Worthy Keeler, PA. Post procedure Diagnosis Wound #: Same as Pre-Procedure Notes: left proximal plantar foot using #3 curette Plan Follow-up Appointments: Return Appointment in 1 week. Dressing Change Frequency: Wound #21 Left,Plantar Foot: Change dressing three times week. Wound #22 Right Metatarsal head first: Change dressing three times week. Wound Cleansing: Clean wound with Wound Cleanser May shower with protection. Primary Wound Dressing: Wound #21 Left,Plantar Foot: Calcium Alginate with Silver Wound #22 Right Metatarsal head first: Calcium Alginate with Silver Secondary Dressing: Wound #21 Left,Plantar Foot: Foam - foam donut to offload Kerlix/Rolled Gauze Dry Gauze ABD pad - as needed Wound #22 Right Metatarsal head first: Foam - foam donut to offload Kerlix/Rolled Gauze Dry Gauze ABD pad - as needed Home Health: [place] [facility] for wound care. - [facility] ordered were: X-ray, foot left complete view - diabetic foot ulcer left plantar foot [id number], X-ray, foot right complete view - diabetic foot ulcer right 1st metatarsal head CPT 73630 Services and Therapies ordered were: Arterial Studies- Bilateral with ABIs - noncompressible DP pulses in clinic, nonhealing  diabetic foot ulcers bilateral feet 1. My suggestion at this time based on what I am seeing is good to be that we go ahead and initiate treatment with a silver alginate dressing. This will be to the bilateral plantar feet. 2. I am also going to suggest currently that we go ahead and use foam padding over top of this as well as far as trying offloading cut I  do not out of this to try to aid with offloading as well. 3. I am to recommend his normal shoes be utilized he does have diabetic shoes custom that he also states he needs to pick up that are new he has not picked those up yet. 4. I am going to order x-rays for the bilateral feet to evaluate for any signs of infection and confirm everything appears to be doing okay. 5. I am also can order arterial studies bilateral with ABIs and obviously he will not have TBI's secondary to the fact that he has transmetatarsal amputations currently. We will see patient back for reevaluation in 1 week here in the clinic. If anything worsens or changes patient will contact our office for additional recommendations. Electronic Signature(s) Signed: 07/21/2019 6:31:12 PM By: Worthy Keeler PA-C Previous Signature: 07/21/2019 6:30:07 PM Version By: Worthy Keeler PA-C Entered By: Worthy Keeler on 07/21/2019 18:31:11 -------------------------------------------------------------------------------- HxROS Details Patient Name: Date of Service: [name], [name] 07/21/2019 2:45 PM Medical Record [id number] Patient Account Number: [id number] Date of Birth/Sex: Treating RN: 08/27/1940 (79 y.o. [name] Primary Care Provider: [name] Other Clinician: Referring Provider: Treating Provider/Extender:Stone III, Jacqualyn Posey, [name] Weeks in Treatment: 0 Information Obtained From Patient Constitutional Symptoms (General Health) Complaints and Symptoms: Negative for: Fatigue; Fever; Chills; Marked Weight  Change Ear/Nose/Mouth/Throat Complaints and Symptoms: Negative for: Chronic sinus problems or rhinitis Medical History: Negative for: Chronic sinus problems/congestion; Middle ear problems Respiratory Complaints and Symptoms: Negative for: Chronic or frequent coughs; Shortness of Breath Medical History: Negative for: Aspiration; Asthma; Chronic Obstructive Pulmonary Disease (COPD); Pneumothorax; Sleep Apnea; Tuberculosis Cardiovascular Complaints and Symptoms: Negative for: Chest pain Medical History: Positive for: Hypertension; Peripheral Arterial Disease Negative for: Angina; Arrhythmia; Congestive Heart Failure; Coronary Artery Disease; Deep Vein Thrombosis; Hypotension; Myocardial Infarction; Peripheral Venous Disease; Phlebitis; Vasculitis Past Medical History Notes: hyperlipidemia Gastrointestinal Complaints and Symptoms: Negative for: Frequent diarrhea; Nausea; Vomiting Medical History: Negative for: Cirrhosis ; Colitis; Crohns; Hepatitis A; Hepatitis B; Hepatitis C Endocrine Complaints and Symptoms: Negative for: Heat/cold intolerance Medical History: Positive for: Type II Diabetes Negative for: Type I Diabetes Treated with: Oral agents Blood sugar tested every day: No Genitourinary Complaints and Symptoms: Negative for: Frequent urination Medical History: Negative for: End Stage Renal Disease Integumentary (Skin) Complaints and Symptoms: Positive for: Wounds - both feet Medical History: Negative for: History of Burn Musculoskeletal Complaints and Symptoms: Negative for: Muscle Pain; Muscle Weakness Medical History: Positive for: Osteomyelitis - right foot Negative for: Gout; Rheumatoid Arthritis; Osteoarthritis Neurologic Complaints and Symptoms: Negative for: Numbness/parasthesias Medical History: Positive for: Neuropathy Negative for: Dementia; Quadriplegia; Paraplegia; Seizure Disorder Psychiatric Complaints and Symptoms: Negative for:  Claustrophobia; Suicidal Medical History: Negative for: Anorexia/bulimia; Confinement Anxiety Eyes Medical History: Positive for: Cataracts - both removed; Glaucoma - both eyes Negative for: Optic Neuritis Hematologic/Lymphatic Medical History: Negative for: Anemia; Hemophilia; Human Immunodeficiency Virus; Lymphedema; Sickle Cell Disease Immunological Medical History: Negative for: Lupus Erythematosus; Raynauds; Scleroderma Oncologic Medical History: Negative for: Received Chemotherapy; Received Radiation HBO Extended History Items Eyes: Eyes: Cataracts Glaucoma Immunizations Pneumococcal Vaccine: Received Pneumococcal Vaccination: No Implantable Devices No devices added Hospitalization / Surgery History Type of Hospitalization/Surgery back surgery 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 left cataract removed Family and Social History Cancer: No; Diabetes: No; Heart Disease: No; Hereditary Spherocytosis: No; Hypertension: No; Kidney Disease: No; Lung Disease: No; Seizures: No; Stroke: No; Thyroid Problems: No; Tuberculosis: No; Current every day smoker - pipe; Marital Status - Single; Alcohol Use: Moderate -  1/2 moonshine per week; Drug Use: No History; Caffeine Use: Moderate; Financial Concerns: No; Food, Clothing or Shelter Needs: No; Support System Lacking: No; Transportation Concerns: No Electronic Signature(s) Signed: 07/21/2019 5:54:18 PM By: Deon Pilling Signed: 07/21/2019 7:52:58 PM By: Worthy Keeler PA-C Entered By: Deon Pilling on 07/21/2019 15:57:46 -------------------------------------------------------------------------------- SuperBill Details Patient Name: Date of Service: [name], [name] 07/21/2019 Medical Record Number:[id number] Patient Account Number: [id number] Date of Birth/Sex: Treating RN: 10/29/1940 (79 y.o. [name] Primary Care Provider: [name] Other Clinician: Referring Provider: Treating  Provider/Extender:Stone III, Jacqualyn Posey, [name] Weeks in Treatment: 0 Diagnosis Coding ICD-10 Codes Code Description E11.621 Type 2 diabetes mellitus with foot ulcer L97.522 Non-pressure chronic ulcer of other part of left foot with fat layer exposed L97.512 Non-pressure chronic ulcer of other part of right foot with fat layer exposed I73.89 Other specified peripheral vascular diseases F17.218 Nicotine dependence, cigarettes, with other nicotine-induced disorders I10 Essential (primary) hypertension L84 Corns and callosities Facility Procedures CPT4 Code Description: [id number] - WOUND CARE VISIT-LEV 3 EST PT Modifier: 25 Quantity: 1 CPT4 Code Description: [id number] - DEB SUBQ TISSUE 20 SQ CM/< ICD-10 Diagnosis Description L97.522 Non-pressure chronic ulcer of other part of left foot with f L97.512 Non-pressure chronic ulcer of other part of right foot with Modifier: at layer expos fat layer expo Quantity: 1 ed sed CPT4 Code Description: [id number] 11055 - PARE BENIGN LES; SGL ICD-10 Diagnosis Description L84 Corns and callosities Modifier: 42 Quantity: 1 Physician Procedures CPT4 Code Description: [id number] - WC PHYS LEVEL 4 - NEW PT ICD-10 Diagnosis Description E11.621 Type 2 diabetes mellitus with foot ulcer L97.522 Non-pressure chronic ulcer of other part of left foot wit L97.512 Non-pressure chronic ulcer of other  part of right foot wi I73.89 Other specified peripheral vascular diseases Modifier: 25 h fat layer ex th fat layer e Quantity: 1 posed xposed CPT4 Code Description: [id number] - WC PHYS SUBQ TISS 20 SQ CM ICD-10 Diagnosis Description L97.522 Non-pressure chronic ulcer of other part of left foot with f L97.512 Non-pressure chronic ulcer of other part of right foot with Modifier: at layer expos fat layer expo Quantity: 1 ed sed CPT4 Code Description: [id number] - WC PHYS PARE BENIGN LES; SGL ICD-10 Diagnosis Description L84 Corns and  callosities Modifier: 59 Quantity: 1 Electronic Signature(s) Signed: 07/21/2019 6:31:42 PM By: Worthy Keeler PA-C Previous Signature: 07/21/2019 6:30:34 PM Version By: Worthy Keeler PA-C Entered By: Worthy Keeler on 07/21/2019 18:31:41

## 2019-07-21 NOTE — Progress Notes (Signed)
HENLEY, WAHEED ([id number]) Visit Report for 07/21/2019 Abuse/Suicide Risk Screen Details Patient Name: Date of Service: [name], [name] 07/21/2019 2:45 PM Medical Record Number:[id number] Patient Account Number: [id number] Date of Birth/Sex: Treating RN: December 19, 1940 (79 y.o. [name] Primary Care Rees Matura: [name] Other Clinician: Referring Jasmeet Gehl: Treating Kenderick Kobler/Extender:Stone III, Jacqualyn Posey, [name] Weeks in Treatment: 0 Abuse/Suicide Risk Screen Items Answer ABUSE RISK SCREEN: Has anyone close to you tried to hurt or harm you recentlyo No Do you feel uncomfortable with anyone in your familyo No Has anyone forced you do things that you didnt want to doo No Electronic Signature(s) Signed: 07/21/2019 5:54:18 PM By: Deon Pilling Entered By: Deon Pilling on 07/21/2019 16:01:50 -------------------------------------------------------------------------------- Activities of Daily Living Details Patient Name: Date of Service: [name], [name] 07/21/2019 2:45 PM Medical Record Number:[id number] Patient Account Number: [id number] Date of Birth/Sex: Treating RN: 1941-04-01 (79 y.o. [name] Primary Care Ellanie Oppedisano: [name] Other Clinician: Referring Stuart Mirabile: Treating Raife Lizer/Extender:Stone III, Jacqualyn Posey, [name] Weeks in Treatment: 0 Activities of Daily Living Items Answer Activities of Daily Living (Please select one for each item) Drive Automobile Not Able Take Medications Completely Able Use Telephone Completely Able Care for Appearance Completely Able Use Toilet Completely Able Bath / Shower Completely Able Dress Self Completely Able Feed Self Completely Able Walk Need Assistance Get In / Out Bed Completely Axis Need Assistance Shop for Self Need Assistance Electronic Signature(s) Signed: 07/21/2019 5:54:18 PM By: Deon Pilling Entered By: Deon Pilling on 07/21/2019 15:46:17 -------------------------------------------------------------------------------- Education Screening Details Patient Name: Date of Service: [name], [name] 07/21/2019 2:45 PM Medical Record [id number] Patient Account Number: [id number] Date of Birth/Sex: Treating RN: [birth year] (78 y.o. [name] Primary Care Faelynn Wynder: [name] Other Clinician: Referring Efosa Treichler: Treating Melondy Blanchard/Extender:Stone III, Jacqualyn Posey, [name] Weeks in Treatment: 0 Primary Learner Assessed: Patient Learning Preferences/Education Level/Primary Language Learning Preference: Explanation, Demonstration, Printed Material Highest Education Level: High School Preferred Language: English Cognitive Barrier Language Barrier: No Translator Needed: No Memory Deficit: No Emotional Barrier: No Cultural/Religious Beliefs Affecting Medical Care: No Physical Barrier Impaired Vision: Yes glaucoma Impaired Hearing: No Decreased Hand dexterity: No Knowledge/Comprehension Knowledge Level: High Comprehension Level: High Ability to understand written High instructions: Ability to understand verbal High instructions: Motivation Anxiety Level: Calm Cooperation: Cooperative Education Importance: Acknowledges Need Interest in Health Problems: Asks Questions Perception: Coherent Willingness to Engage in Self- High Management Activities: Readiness to Engage in Self- High Management Activities: Electronic Signature(s) Signed: 07/21/2019 5:54:18 PM By: Deon Pilling Entered By: Deon Pilling on 07/21/2019 15:46:54 -------------------------------------------------------------------------------- Fall Risk Assessment Details Patient Name: Date of Service: [name], [name] 07/21/2019 2:45 PM Medical Record [id number] Patient Account Number: [id number] Date of Birth/Sex: Treating RN: Apr 21, 1941 (79 y.o. [name] Primary Care Marcelis Wissner: [name] Other Clinician: Referring Kandra Graven: Treating Marymargaret Kirker/Extender:Stone III, Jacqualyn Posey, [name] Weeks in Treatment: 0 Fall Risk Assessment Items Have you had 2 or more falls in the last 12 monthso 0 No Have you had any fall that resulted in injury in the last 12 monthso 0 No FALLS RISK SCREEN History of falling - immediate or within 3 months 0 No Secondary diagnosis (Do you have 2 or more medical diagnoseso) 0 No Ambulatory aid None/bed rest/wheelchair/nurse 0 No Crutches/cane/walker 15 Yes Furniture 0 No Intravenous therapy Access/Saline/Heparin Lock 0 No Weak (short steps with or without shuffle, stooped but able to lift head 0 No while walking, may seek support from furniture) Impaired (short steps with shuffle, may have difficulty  arising from chair, 0 No head down, impaired balance) Mental Status Oriented to own ability 0 Yes Overestimates or forgets limitations 0 No Risk Level: Low Risk Score: 15 Electronic Signature(s) Signed: 07/21/2019 5:54:18 PM By: Deon Pilling Entered By: Deon Pilling on 07/21/2019 15:47:12 -------------------------------------------------------------------------------- Foot Assessment Details Patient Name: Date of Service: [name], [name] 07/21/2019 2:45 PM Medical Record [id number] Patient Account Number: [id number] Date of Birth/Sex: Treating RN: 1940/10/22 (79 y.o. [name] Primary Care Eduardo Wurth: [name] Other Clinician: Referring Amal Saiki: Treating Kymoni Monday/Extender:Stone III, Jacqualyn Posey, [name] Weeks in Treatment: 0 Foot Assessment Items Site Locations + = Sensation present, - = Sensation absent, C = Callus, U = Ulcer R = Redness, W = Warmth, M = Maceration, PU = Pre-ulcerative lesion F = Fissure, S = Swelling, D = Dryness Assessment Right: Left: Other Deformity: No No Prior Foot Ulcer: No No Prior Amputation: No No Charcot Joint: No No Ambulatory Status: Ambulatory With  Help Assistance Device: Cane Gait: Administrator, arts) Signed: 07/21/2019 5:54:18 PM By: Deon Pilling Entered By: Deon Pilling on 07/21/2019 16:03:15 -------------------------------------------------------------------------------- Nutrition Risk Screening Details Patient Name: Date of Service: [name], [name] 07/21/2019 2:45 PM Medical Record Number:[id number] Patient Account Number: [id number] Date of Birth/Sex: Treating RN: 11/10/1940 (79 y.o. [name] Primary Care Paislei Dorval: [name] Other Clinician: Referring Zeus Marquis: Treating Sincere Liuzzi/Extender:Stone III, Jacqualyn Posey, [name] Weeks in Treatment: 0 Height (in): 73 Weight (lbs): 190 Body Mass Index (BMI): 25.1 Nutrition Risk Screening Items Score Screening NUTRITION RISK SCREEN: I have an illness or condition that made me change the kind and/or 2 Yes amount of food I eat I eat fewer than two meals per day 0 No I eat few fruits and vegetables, or milk products 0 No I have three or more drinks of beer, liquor or wine almost every day 0 No I have tooth or mouth problems that make it hard for me to eat 0 No I don't always have enough money to buy the food I need 0 No I eat alone most of the time 0 No I take three or more different prescribed or over-the-counter drugs a day 1 Yes 0 No Without wanting to, I have lost or gained 10 pounds in the last six months I am not always physically able to shop, cook and/or feed myself 2 Yes Nutrition Protocols Good Risk Protocol Provide education on elevated blood sugars and Moderate Risk Protocol 0 impact on wound healing, as applicable High Risk Proctocol Risk Level: Moderate Risk Score: 5 Electronic Signature(s) Signed: 07/21/2019 5:54:18 PM By: Deon Pilling Entered By: Deon Pilling on 07/21/2019 15:49:04

## 2019-07-24 ENCOUNTER — Other Ambulatory Visit (HOSPITAL_COMMUNITY): Payer: Self-pay | Admitting: Physician Assistant

## 2019-07-24 ENCOUNTER — Ambulatory Visit (HOSPITAL_COMMUNITY)
Admission: RE | Admit: 2019-07-24 | Discharge: 2019-07-24 | Disposition: A | Discharge status: Home / Self Care | Payer: Medicare Other | Source: Ambulatory Visit | Attending: Physician Assistant | Admitting: Physician Assistant

## 2019-07-24 ENCOUNTER — Other Ambulatory Visit: Payer: Self-pay

## 2019-07-24 ENCOUNTER — Emergency Department (HOSPITAL_COMMUNITY)
Admission: EM | Admit: 2019-07-24 | Discharge: 2019-07-24 | Discharge status: Absent without leave | Payer: Medicare Other

## 2019-07-24 DIAGNOSIS — L97512 Non-pressure chronic ulcer of other part of right foot with fat layer exposed: Secondary | ICD-10-CM

## 2019-07-24 DIAGNOSIS — L97522 Non-pressure chronic ulcer of other part of left foot with fat layer exposed: Secondary | ICD-10-CM

## 2019-07-26 ENCOUNTER — Other Ambulatory Visit: Payer: Self-pay

## 2019-07-26 ENCOUNTER — Ambulatory Visit (HOSPITAL_COMMUNITY)
Admission: RE | Admit: 2019-07-26 | Discharge: 2019-07-26 | Disposition: A | Discharge status: Home / Self Care | Payer: Medicare Other | Source: Ambulatory Visit | Attending: Surgery | Admitting: Surgery

## 2019-07-26 ENCOUNTER — Other Ambulatory Visit (HOSPITAL_COMMUNITY): Payer: Self-pay | Admitting: Physician Assistant

## 2019-07-26 DIAGNOSIS — E11621 Type 2 diabetes mellitus with foot ulcer: Secondary | ICD-10-CM | POA: Insufficient documentation

## 2019-07-26 DIAGNOSIS — L97509 Non-pressure chronic ulcer of other part of unspecified foot with unspecified severity: Secondary | ICD-10-CM

## 2019-07-27 NOTE — Progress Notes (Signed)
SAMBA, MADEIRA ([id number]) Visit Report for 07/21/2019 Allergy List Details Patient Name: Date of Service: ABHILASH, EMENS 07/21/2019 2:45 PM Medical Record Number:[id number] Patient Account Number: [id number] Date of Birth/Sex: Treating RN: [birth year] (79 y.o. [name] Primary Care Augustine Brannick: [name] Other Clinician: Referring Christapher Gillian: Treating Rawlins Stuard/Extender:Stone III, Jacqualyn Posey, [name] Weeks in Treatment: 0 Allergies Active Allergies No Known Drug Allergies Allergy Notes Electronic Signature(s) Signed: 07/21/2019 5:54:18 PM By: Deon Pilling Entered By: Deon Pilling on 07/21/2019 15:45:46 -------------------------------------------------------------------------------- Arrival Information Details Patient Name: Date of Service: ADDY, BETANCOURT 07/21/2019 2:45 PM Medical Record [id number] Patient Account Number: [id number] Date of Birth/Sex: Treating RN: [birth year] (78 y.o. [name] Primary Care Shaday Rayborn: [name] Other Clinician: Referring Heleena Miceli: Treating Lahoma Constantin/Extender:Stone III, Jacqualyn Posey, [name] Weeks in Treatment: 0 Visit Information Patient Arrived: Cane Arrival Time: 15:40 Accompanied By: self Transfer Assistance: None Patient Identification Verified: Yes Secondary Verification Process Yes Completed: Patient Requires Transmission- No Based Precautions: Patient Has Alerts: Yes Patient Alerts: R ABI non compressible L ABI non compressible History Since Last Visit Added or deleted any medications: No Any new allergies or adverse reactions: No Had a fall or experienced change in activities of daily living that may affect risk of falls: No Signs or symptoms of abuse/neglect since last visito No Hospitalized since last visit: No Implantable device outside of the clinic excluding cellular tissue based products placed in the center since last visit: No Electronic Signature(s) Signed: 07/27/2019  5:30:42 PM By: Levan Hurst RN, BSN Entered By: Levan Hurst on 07/21/2019 16:56:06 -------------------------------------------------------------------------------- Clinic Level of Care Assessment Details Patient Name: Date of Service: KAIN, MOREAUX 07/21/2019 2:45 PM Medical Record Number:[id number] Patient Account Number: [id number] Date of Birth/Sex: Treating RN: 11-28-1940 (79 y.o. [name] Primary Care Jaimy Kliethermes: [name] Other Clinician: Referring Miya Luviano: Treating Preston Garabedian/Extender:Stone III, Jacqualyn Posey, [name] Weeks in Treatment: 0 Clinic Level of Care Assessment Items TOOL 1 Quantity Score []  - Use when EandM and Procedure is performed on INITIAL visit 0 ASSESSMENTS - Nursing Assessment / Reassessment X - General Physical Exam (combine w/ comprehensive assessment (listed just below) 1 20 when performed on new pt. evals) X - Comprehensive Assessment (HX, ROS, Risk Assessments, Wounds Hx, etc.) 1 25 ASSESSMENTS - Wound and Skin Assessment / Reassessment []  - Dermatologic / Skin Assessment (not related to wound area) 0 ASSESSMENTS - Ostomy and/or Continence Assessment and Care []  - Incontinence Assessment and Management 0 []  - Ostomy Care Assessment and Management (repouching, etc.) 0 PROCESS - Coordination of Care X - Simple Patient / Family Education for ongoing care 1 15 []  - Complex (extensive) Patient / Family Education for ongoing care 0 X - Staff obtains Programmer, systems, Records, Test Results / Process Orders 1 10 X - Staff telephones HHA, Nursing Homes / Clarify orders / etc 1 10 []  - Routine Transfer to another Facility (non-emergent condition) 0 []  - Routine Hospital Admission (non-emergent condition) 0 X - New Admissions / Biomedical engineer / Ordering NPWT, Apligraf, etc. 1 15 []  - Emergency Hospital Admission (emergent condition) 0 PROCESS - Special Needs []  - Pediatric / Minor Patient Management 0 []  - Isolation Patient  Management 0 []  - Hearing / Language / Visual special needs 0 []  - Assessment of Community assistance (transportation, D/C planning, etc.) 0 []  - Additional assistance / Altered mentation 0 []  - Support Surface(s) Assessment (bed, cushion, seat, etc.) 0 INTERVENTIONS - Miscellaneous []  - External ear exam 0 []  - Patient Transfer (multiple staff / Civil Service fast streamer / Similar devices) 0 []  -  Simple Staple / Suture removal (25 or less) 0 []  - Complex Staple / Suture removal (26 or more) 0 []  - Hypo/Hyperglycemic Management (do not check if billed separately) 0 X - Ankle / Brachial Index (ABI) - do not check if billed separately 1 15 Has the patient been seen at the hospital within the last three years: Yes Total Score: 110 Level Of Care: New/Established - Level 3 Electronic Signature(s) Signed: 07/21/2019 6:36:45 PM By: Baruch Gouty RN, BSN Entered By: Baruch Gouty on 07/21/2019 16:57:38 -------------------------------------------------------------------------------- Encounter Discharge Information Details Patient Name: Date of Service: DETRON, TURVEY 07/21/2019 2:45 PM Medical Record [id number] Patient Account Number: [id number] Date of Birth/Sex: Treating RN: 10-Jan-1941 (78 y.o. [name]) [name] Primary Care Romonda Parker: [name] Other Clinician: Referring Toney Lizaola: Treating Isiac Breighner/Extender:Stone III, Jacqualyn Posey, [name] Weeks in Treatment: 0 Encounter Discharge Information Items Post Procedure Vitals Discharge Condition: Stable Temperature (F): 98.4 Ambulatory Status: Cane Pulse (bpm): 72 Discharge Destination: Home Respiratory Rate (breaths/min): 20 Transportation: Private Auto Blood Pressure (mmHg): 147/71 Accompanied By: self Schedule Follow-up Appointment: Yes Clinical Summary of Care: Patient Declined Electronic Signature(s) Signed: 07/23/2019 5:25:54 PM By: [name] RN Entered By: [name] on 07/21/2019  17:31:28 -------------------------------------------------------------------------------- Lower Extremity Assessment Details Patient Name: Date of Service: GEORGY, STAPLE 07/21/2019 2:45 PM Medical Record Number:[id number] Patient Account Number: [id number] Date of Birth/Sex: Treating RN: [birth year] (79 y.o. [name] Primary Care Kari Kerth: [name] Other Clinician: Referring Huriel Matt: Treating Mahad Newstrom/Extender:Stone III, Jacqualyn Posey, [name] Weeks in Treatment: 0 Edema Assessment Assessed: [Left: No] [Right: No] Edema: [Left: No] [Right: No] Calf Left: Right: Point of Measurement: 32 cm From Medial Instep 32 cm 35 cm Ankle Left: Right: Point of Measurement: 10 cm From Medial Instep 23.5 cm 26 cm Vascular Assessment Pulses: Dorsalis Pedis Palpable: [Left:Yes] [Right:Yes] Electronic Signature(s) Signed: 07/21/2019 5:54:18 PM By: Deon Pilling Entered By: Deon Pilling on 07/21/2019 16:03:43 -------------------------------------------------------------------------------- Multi-Disciplinary Care Plan Details Patient Name: Date of Service: SHION, STIFEL 07/21/2019 2:45 PM Medical Record [id number] Patient Account Number: [id number] Date of Birth/Sex: Treating RN: 11-Jun-1941 (79 y.o. [name] Primary Care Shonika Kolasinski: [name] Other Clinician: Referring Murriel Holwerda: Treating Guillermo Nehring/Extender:Stone III, Jacqualyn Posey, [name] Weeks in Treatment: 0 Active Inactive Abuse / Safety / Falls / Self Care Management Nursing Diagnoses: Potential for falls Goals: Patient/caregiver will verbalize/demonstrate measures taken to prevent injury and/or falls Date Initiated: 07/21/2019 Target Resolution Date: 08/18/2019 Goal Status: Active Interventions: Assess fall risk on admission and as needed Assess impairment of mobility on admission and as needed per policy Notes: Nutrition Nursing Diagnoses: Impaired glucose control: actual or  potential Potential for alteratiion in Nutrition/Potential for imbalanced nutrition Goals: Patient/caregiver will maintain therapeutic glucose control Date Initiated: 07/21/2019 Target Resolution Date: 08/18/2019 Goal Status: Active Interventions: Assess HgA1c results as ordered upon admission and as needed Assess patient nutrition upon admission and as needed per policy Provide education on elevated blood sugars and impact on wound healing Treatment Activities: Patient referred to Primary Care Physician for further nutritional evaluation : 07/21/2019 Notes: Wound/Skin Impairment Nursing Diagnoses: Impaired tissue integrity Knowledge deficit related to smoking impact on wound healing Knowledge deficit related to ulceration/compromised skin integrity Goals: Patient will demonstrate a reduced rate of smoking or cessation of smoking Date Initiated: 07/21/2019 Target Resolution Date: 08/18/2019 Goal Status: Active Patient/caregiver will verbalize understanding of skin care regimen Date Initiated: 07/21/2019 Target Resolution Date: 08/18/2019 Goal Status: Active Ulcer/skin breakdown will have a volume reduction of 30% by week 4 Date Initiated: 07/21/2019 Target Resolution Date: 08/18/2019 Goal Status:  Active Interventions: Assess patient/caregiver ability to obtain necessary supplies Assess patient/caregiver ability to perform ulcer/skin care regimen upon admission and as needed Assess ulceration(s) every visit Provide education on smoking Provide education on ulcer and skin care Treatment Activities: Skin care regimen initiated : 07/21/2019 Topical wound management initiated : 07/21/2019 Notes: Electronic Signature(s) Signed: 07/21/2019 6:36:45 PM By: Baruch Gouty RN, BSN Entered By: Baruch Gouty on 07/21/2019 16:56:18 -------------------------------------------------------------------------------- Pain Assessment Details Patient Name: Date of Service: DENE, SANDERFER 07/21/2019 2:45  PM Medical Record [id number] Patient Account Number: [id number] Date of Birth/Sex: Treating RN: 14-Apr-1941 (78 y.o. [name] Primary Care Cyntha Brickman: [name] Other Clinician: Referring Torrin Frein: Treating Ramesh Moan/Extender:Stone III, Jacqualyn Posey, [name] Weeks in Treatment: 0 Active Problems Location of Pain Severity and Description of Pain Patient Has Paino Yes Site Locations Pain Location: Pain in Ulcers With Dressing Change: Yes Duration of the Pain. Constant / Intermittento Intermittent Rate the pain. Current Pain Level: 8 Worst Pain Level: 10 Least Pain Level: 0 Character of Pain Describe the Pain: Burning Pain Management and Medication Current Pain Management: Medication: Yes Cold Application: No Rest: No Massage: No Activity: No T.E.N.S.: No Heat Application: No Leg drop or elevation: No Is the Current Pain Management Adequate: Adequate How does your wound impact your activities of daily livingo Sleep: No Bathing: No Appetite: No Relationship With Others: No Bladder Continence: No Emotions: No Bowel Continence: No Work: No Toileting: No Drive: No Dressing: No Hobbies: No Electronic Signature(s) Signed: 07/21/2019 5:54:18 PM By: Deon Pilling Entered By: Deon Pilling on 07/21/2019 16:06:12 -------------------------------------------------------------------------------- Patient/Caregiver Education Details Patient Name: Date of Service: JAIDYN, RENNO 2/3/2021andnbsp2:45 PM Medical Record Patient Account Number: [id number] Number: Treating RN: Baruch Gouty Date of Birth/Gender: 03-18-1941 (78 y.o. M) Other Clinician: Primary Care Physician: [name] Treating Physician/Extender:Stone III, Margarita Grizzle Referring Physician: Ouida Sills in Treatment: 0 Education Assessment Education Provided To: Patient Education Topics Provided Elevated Blood Sugar/ Impact on Healing: Methods:  Explain/Verbal Responses: Reinforcements needed, State content correctly Offloading: Handouts: What is Offloadingo Methods: Explain/Verbal Responses: Reinforcements needed, State content correctly Smoking and Wound Healing: Methods: Explain/Verbal, Printed Responses: Reinforcements needed, State content correctly Wound/Skin Impairment: Handouts: Caring for Your Ulcer, Skin Care Do's and Dont's, Smoking and Wound Healing Methods: Explain/Verbal, Printed Responses: Reinforcements needed, State content correctly Electronic Signature(s) Signed: 07/21/2019 6:36:45 PM By: Baruch Gouty RN, BSN Entered By: Baruch Gouty on 07/21/2019 16:57:02 -------------------------------------------------------------------------------- Wound Assessment Details Patient Name: Date of Service: ADD, JEZEWSKI 07/21/2019 2:45 PM Medical Record [id number] Patient Account Number: [id number] Date of Birth/Sex: Treating RN: 17-Sep-1940 (79 y.o. M) Primary Care Damyn Weitzel: [name] Other Clinician: Referring Marton Malizia: Treating Eulalio Reamy/Extender:Stone III, Jacqualyn Posey, [name] Weeks in Treatment: 0 Wound Status Wound Number: 21 Primary Diabetic Wound/Ulcer of the Lower Extremity Etiology: Wound Location: Left Foot - Plantar Wound Open Wounding Event: Gradually Appeared Status: Date Acquired: 06/17/2018 Comorbid Cataracts, Glaucoma, Hypertension, Weeks Of Treatment: 0 History: Peripheral Arterial Disease, Type II Diabetes, Clustered Wound: No Osteomyelitis, Neuropathy Photos Wound Measurements Length: (cm) 4.2 Width: (cm) 0.7 Depth: (cm) 0.5 Area: (cm) 2.309 Volume: (cm) 1.155 Wound Description Classification: Grade 2 Wound Margin: Thickened Exudate Amount: Medium Exudate Type: Serosanguineous Exudate Color: red, brown Wound Bed Granulation Amount: Large (67-100%) Granulation Quality: Pink Necrotic Amount: Small (1-33%) Necrotic Quality: Adherent Slough fter  Cleansing: No ino Yes Exposed Structure sed: No Subcutaneous Tissue) Exposed: Yes sed: No sed: No ed: No ed: No % Reduction in Area: 0% % Reduction in Volume: 0% Epithelialization: None Tunneling: No Undermining: No Foul  Odor A Slough/Fibr Fascia Expo Fat Layer ( Tendon Expo Muscle Expo Joint Expos Bone Expos Treatment Notes Wound #21 (Left, Plantar Foot) 1. Cleanse With Wound Cleanser 3. Primary Dressing Applied Calcium Alginate Ag 4. Secondary Dressing Foam Border Dressing Electronic Signature(s) Signed: 07/26/2019 4:47:04 PM By: Mikeal Hawthorne EMT/HBOT Previous Signature: 07/21/2019 5:54:18 PM Version By: Deon Pilling Entered By: Mikeal Hawthorne on 07/26/2019 15:04:12 -------------------------------------------------------------------------------- Wound Assessment Details Patient Name: Date of Service: GUMESINDO, BLANCETT 07/21/2019 2:45 PM Medical Record [id number] Patient Account Number: [id number] Date of Birth/Sex: Treating RN: 1940/07/27 (79 y.o. M) Primary Care Adriene Knipfer: [name] Other Clinician: Referring Duru Reiger: Treating Tolulope Pinkett/Extender:Stone III, Jacqualyn Posey, [name] Weeks in Treatment: 0 Wound Status Wound Number: 22 Primary Diabetic Wound/Ulcer of the Lower Extremity Etiology: Wound Location: Right Metatarsal head first Wound Open Wounding Event: Gradually Appeared Status: Date Acquired: 06/17/2018 Comorbid Cataracts, Glaucoma, Hypertension, Weeks Of Treatment: 0 History: Peripheral Arterial Disease, Type II Diabetes, Clustered Wound: No Osteomyelitis, Neuropathy Photos Wound Measurements Length: (cm) 1 Width: (cm) 0.8 Depth: (cm) 0.3 Area: (cm) 0.628 Volume: (cm) 0.188 Wound Description Classification: Grade 2 Wound Margin: Thickened Exudate Amount: Medium Exudate Type: Serosanguineous Exudate Color: red, brown Wound Bed Granulation Amount: Large (67-100%) Granulation Quality: Pink Necrotic Amount: Small  (1-33%) Necrotic Quality: Adherent Slough Foul Odor After Cleansing: No Slough/Fibrino Yes Exposed Structure Fascia Exposed: No Fat Layer (Subcutaneous Tissue) Exposed: Ye Tendon Exposed: No Muscle Exposed: No Joint Exposed: No Bone Exposed: No % Reduction in Area: 0% % Reduction in Volume: 0% Epithelialization: None Tunneling: No Undermining: No s Treatment Notes Wound #22 (Right Metatarsal head first) 1. Cleanse With Wound Cleanser 3. Primary Dressing Applied Calcium Alginate Ag 4. Secondary Dressing Foam Border Dressing Electronic Signature(s) Signed: 07/26/2019 4:47:04 PM By: Mikeal Hawthorne EMT/HBOT Previous Signature: 07/21/2019 5:54:18 PM Version By: Deon Pilling Entered By: Mikeal Hawthorne on 07/26/2019 15:04:33 -------------------------------------------------------------------------------- Vitals Details Patient Name: Date of Service: LENNYX, GAAL 07/21/2019 2:45 PM Medical Record [id number] Patient Account Number: [id number] Date of Birth/Sex: Treating RN: Feb 05, 1941 (79 y.o. [name] Primary Care Whalen Trompeter: [name] Other Clinician: Referring Akasia Ahmad: Treating Shekita Boyden/Extender:Stone III, Jacqualyn Posey, [name] Weeks in Treatment: 0 Vital Signs Time Taken: 15:45 Temperature (F): 98.4 Height (in): 73 Pulse (bpm): 72 Source: Stated Respiratory Rate (breaths/min): 20 Weight (lbs): 180 Blood Pressure (mmHg): 147/71 Source: Stated Reference Range: 80 - 120 mg / dl Body Mass Index (BMI): 23.7 Electronic Signature(s) Signed: 07/21/2019 5:54:18 PM By: Deon Pilling Entered By: Deon Pilling on 07/21/2019 15:49:29

## 2019-07-28 ENCOUNTER — Encounter (HOSPITAL_BASED_OUTPATIENT_CLINIC_OR_DEPARTMENT_OTHER): Payer: Medicare Other | Admitting: Physician Assistant

## 2019-07-29 ENCOUNTER — Encounter (HOSPITAL_BASED_OUTPATIENT_CLINIC_OR_DEPARTMENT_OTHER): Payer: Medicare Other | Attending: Physician Assistant | Admitting: Internal Medicine

## 2019-07-29 ENCOUNTER — Other Ambulatory Visit: Payer: Self-pay

## 2019-07-29 DIAGNOSIS — E11621 Type 2 diabetes mellitus with foot ulcer: Secondary | ICD-10-CM | POA: Diagnosis not present

## 2019-07-30 NOTE — Progress Notes (Signed)
[name], [name] ([id number]) Visit Report for 07/29/2019 Debridement Details Patient Name: Date of Service: [name], [name] 07/29/2019 8:30 AM Medical Record Number:[id number] Patient Account Number: [id number] Date of Birth/Sex: Treating RN: 12/23/1940 (79 y.o. [name] Primary Care Provider: [name] Other Clinician: Referring Provider: [name] Treating Provider/Extender:Ailyn Gladd, Esperanza Richters in Treatment: 1 Debridement Performed for Wound #21 Left,Plantar Foot Assessment: Performed By: Physician Ricard Dillon., MD Debridement Type: Debridement Severity of Tissue Pre Fat layer exposed Debridement: Level of Consciousness (Pre- Awake and Alert procedure): Pre-procedure Verification/Time Out Taken: Yes - 08:49 Start Time: 08:50 Pain Control: Lidocaine 4% Topical Solution Total Area Debrided (L x W): 3 (cm) x 1 (cm) = 3 (cm) Tissue and other material Viable, Non-Viable, Callus, Slough, Subcutaneous, Skin: Dermis , Fibrin/Exudate, debrided: Slough Level: Skin/Subcutaneous Tissue Debridement Description: Excisional Instrument: Curette Bleeding: Minimum Hemostasis Achieved: Pressure End Time: 08:53 Procedural Pain: 0 Post Procedural Pain: 0 Response to Treatment: Procedure was tolerated well Level of Consciousness Awake and Alert (Post-procedure): Post Debridement Measurements of Total Wound Length: (cm) 3 Width: (cm) 0.7 Depth: (cm) 0.5 Volume: (cm) 0.825 Character of Wound/Ulcer Post Improved Debridement: Severity of Tissue Post Debridement: Fat layer exposed Post Procedure Diagnosis Same as Pre-procedure Electronic Signature(s) Signed: 07/29/2019 5:57:11 PM By: Deon Pilling Signed: 07/30/2019 1:06:39 PM By: Linton Ham MD Entered By: Linton Ham on 07/29/2019 09:07:41 -------------------------------------------------------------------------------- Debridement Details Patient Name: Date of Service: [name], [name] 07/29/2019  8:30 AM Medical Record [id number] Patient Account Number: [id number] Date of Birth/Sex: Treating RN: Apr 02, 1941 (79 y.o. [name] Primary Care Provider: [name] Other Clinician: Referring Provider: [name] Treating Provider/Extender:Brodi Kari, Esperanza Richters in Treatment: 1 Debridement Performed for Wound #22 Right Metatarsal head first Assessment: Performed By: Physician Ricard Dillon., MD Debridement Type: Debridement Severity of Tissue Pre Fat layer exposed Debridement: Level of Consciousness (Pre- Awake and Alert procedure): Pre-procedure Yes - 08:49 Verification/Time Out Taken: Start Time: 08:50 Pain Control: Lidocaine 4% Topical Solution Total Area Debrided (L x W): 1 (cm) x 1 (cm) = 1 (cm) Tissue and other material Viable, Non-Viable, Callus, Slough, Subcutaneous, Skin: Dermis , Fibrin/Exudate, debrided: Slough Level: Skin/Subcutaneous Tissue Debridement Description: Excisional Instrument: Curette Bleeding: Minimum Hemostasis Achieved: Pressure End Time: 08:53 Procedural Pain: 0 Post Procedural Pain: 0 Response to Treatment: Procedure was tolerated well Level of Consciousness Awake and Alert (Post-procedure): Post Debridement Measurements of Total Wound Length: (cm) 0.9 Width: (cm) 0.8 Depth: (cm) 0.3 Volume: (cm) 0.17 Character of Wound/Ulcer Post Improved Debridement: Severity of Tissue Post Debridement: Fat layer exposed Post Procedure Diagnosis Same as Pre-procedure Electronic Signature(s) Signed: 07/29/2019 5:57:11 PM By: Deon Pilling Signed: 07/30/2019 1:06:39 PM By: Linton Ham MD Entered By: Linton Ham on 07/29/2019 09:07:50 -------------------------------------------------------------------------------- HPI Details Patient Name: Date of Service: [name], [name] 07/29/2019 8:30 AM Medical Record [id number] Patient Account Number: [id number] Date of Birth/Sex: Treating RN: 03-05-1941 (79  y.o. [name] Primary Care Provider: [name] Other Clinician: Referring Provider: Treating Provider/Extender:Zykia Walla, Birdena Crandall, [name] in Treatment: 1 History of Present Illness HPI Description: very pleasant 79 year old gentleman has type 2 diabetes and has been having an ulcer on his foot for several years. Been seeing as this time around since the end of October. Classified as a Wagner grade 2 because his previous x-rays did not show any problems. He has an MRI pending this Friday. discussing with him he says he has not been able to get his diabetic shoes yet. He continues to smoke his pipe and drinks moonshine and says he  will not give this up. I have tried to counsel him regarding this but he firmly says that he is happy to listen to me but he is not going to give up his habits. 08/17/14 -- patient has no fresh complaints but has recently come with his MRI which was done on 08/12/2014. 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 Readmission: 07/21/2019 upon evaluation today patient presents for initial inspection here in our clinic concerning issues that he has been having with his bilateral feet. He has open wounds that been present at least since the beginning of the year although he does not know an exact time. He is previously had amputations of all toes and the distal portion of his foot. 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 With that being said he does have a history of diabetes, peripheral vascular disease, nicotine dependence, and hypertension. He has not had any recent arterial or vascular studies he was noncompressible today I think he is going to require referral for arterial studies as well. 2/11; patient was readmitted to our clinic last week. He has bilateral plantar foot wounds in the setting of  previous transmetatarsal amputations. He had his arterial studies done on 2/8; these were actually quite good. He was noncompressible on both sides however his waveforms were triphasic. The thought was noncompressible vessels consistent with medial calcification but without significant stenosis in the lower extremities. X-rays were done on the left this showed extensive chronic and postoperative changes without evidence of osteomyelitis. There was no evidence of osteomyelitis on the right foot there was mild vascular calcification MRI was suggested on the left based on clinical correlation. The patient arrives in clinic with some odor and drainage from the left foot. Right foot measures smaller. He is really not offloading these areas although he says he does not walk much Electronic Signature(s) Signed: 07/30/2019 1:06:39 PM By: Linton Ham MD Entered By: Linton Ham on 07/29/2019 09:10:12 -------------------------------------------------------------------------------- Physical Exam Details Patient Name: Date of Service: [name], [name] 07/29/2019 8:30 AM Medical Record [id number] Patient Account Number: [id number] Date of Birth/Sex: Treating RN: [birth year] (79 y.o. [name] Primary Care Provider: [name] Other Clinician: Referring Provider: Treating Provider/Extender:Mersadies Petree, Birdena Crandall, [name] in Treatment: 1 Constitutional Sitting or standing Blood Pressure is within target range for patient.. Pulse regular and within target range for patient.Marland Kitchen Respirations regular, non-labored and within target range.. Temperature is normal and within the target range for the patient.Marland Kitchen Appears in no distress. Cardiovascular Brisk dorsalis pedis pulse on the left but no posterior tibial pulse. I could not feel his peripheral pulses on the right. Musculoskeletal Previous transmetatarsal amputations. Integumentary (Hair, Skin) He has a widespread  skin issue. There are healed scars on his bilateral legs also on his arms. Etiology of this is unclear. Notes Wound exam; Both wound areas have built up callus and soft tissue around the wound margins. Especially true in the left using a #5 curette this was knocked back on both sides removing subcutaneous tissue trying to get the edges of the wound down to the margins of the wound itself. Electronic Signature(s) Signed: 07/30/2019 1:06:39 PM By: Linton Ham MD Entered By: Linton Ham on 07/29/2019 09:12:15 -------------------------------------------------------------------------------- Physician Orders Details Patient Name: Date of Service: [name], [name] 07/29/2019 8:30 AM Medical Record Number:[id number] Patient Account Number: [id number] Date of Birth/Sex: Treating RN: Nov 26, 1940 (79 y.o. [name] Primary Care Provider: Fara Olden,  [name] Other Clinician: Referring Provider: Treating Provider/Extender:Charlette Hennings, Birdena Crandall, [name] in Treatment: 1 Verbal / Phone Orders: No Diagnosis Coding ICD-10 Coding Code Description E11.621 Type 2 diabetes mellitus with foot ulcer L97.522 Non-pressure chronic ulcer of other part of left foot with fat layer exposed L97.512 Non-pressure chronic ulcer of other part of right foot with fat layer exposed I73.89 Other specified peripheral vascular diseases F17.218 Nicotine dependence, cigarettes, with other nicotine-induced disorders I10 Essential (primary) hypertension L84 Corns and callosities Follow-up Appointments Return Appointment in 1 week. - Wednesday Dressing Change Frequency Wound #21 Left,Plantar Foot Change dressing three times week. Wound #22 Right Metatarsal head first Change dressing three times week. Wound Cleansing Clean wound with Wound Cleanser May shower with protection. Primary Wound Dressing Wound #21 Left,Plantar Foot Calcium Alginate with Silver Wound #22 Right Metatarsal head  first Calcium Alginate with Silver Secondary Dressing Wound #21 Left,Plantar Foot Foam - foam donut to offload Kerlix/Rolled Gauze Dry Gauze ABD pad - as needed Wound #22 Right Metatarsal head first Foam - foam donut to offload Kerlix/Rolled Gauze Dry Gauze ABD pad - as needed Edema Control Avoid standing for long periods of time Elevate legs to the level of the heart or above for 30 minutes daily and/or when sitting, a frequency of: - throughout the day. Off-Loading Open toe surgical shoe to: - for bilateral feet. apply felt to both surgical shoes to aid in offloading. [facility] skilled nursing for wound care. - [facility], [facility]) Signed: 07/29/2019 5:57:11 PM By: Deon Pilling Signed: 07/30/2019 1:06:39 PM By: Linton Ham MD Entered By: Deon Pilling on 07/29/2019 08:57:27 -------------------------------------------------------------------------------- Problem List Details Patient Name: Date of Service: [name], [name] 07/29/2019 8:30 AM Medical Record Number:[id number] Patient Account Number: [id number] Date of Birth/Sex: Treating RN: Aug 16, 1940 (79 y.o. [name], Meta.Reding Primary Care Provider: [name] Other Clinician: Referring Provider: Treating Provider/Extender:Dinorah Masullo, Birdena Crandall, [name] in Treatment: 1 Active Problems ICD-10 Evaluated Encounter Code Description Active Date Today Diagnosis E11.621 Type 2 diabetes mellitus with foot ulcer 07/21/2019 No Yes L97.522 Non-pressure chronic ulcer of other part of left foot 07/21/2019 No Yes with fat layer exposed L97.512 Non-pressure chronic ulcer of other part of right foot 07/21/2019 No Yes with fat layer exposed I73.89 Other specified peripheral vascular diseases 07/21/2019 No Yes F17.218 Nicotine dependence, cigarettes, with other nicotine- 07/21/2019 No Yes induced disorders I10 Essential (primary) hypertension 07/21/2019 No Yes L84 Corns and callosities  07/21/2019 No Yes Inactive Problems Resolved Problems Electronic Signature(s) Signed: 07/30/2019 1:06:39 PM By: Linton Ham MD Entered By: Linton Ham on 07/29/2019 09:07:23 -------------------------------------------------------------------------------- Progress Note Details Patient Name: Date of Service: [name], [name] 07/29/2019 8:30 AM Medical Record [id number] Patient Account Number: [id number] Date of Birth/Sex: Treating RN: 12-Nov-1940 (79 y.o. [name] Primary Care Provider: [name] Other Clinician: Referring Provider: Treating Provider/Extender:Jehieli Brassell, Birdena Crandall, [name] in Treatment: 1 Subjective History of Present Illness (HPI) very pleasant 79 year old gentleman has type 2 diabetes and has been having an ulcer on his foot for several years. Been seeing as this time around since the end of October. Classified as a Wagner grade 2 because his previous x- rays did not show any problems. He has an MRI pending this Friday. discussing with him he says he has not been able to get his diabetic shoes yet. He continues to smoke his pipe and drinks moonshine and says he will not give this up. I have tried to counsel him regarding this but he firmly says that he is  happy to listen to me but he is not going to give up his habits. 08/17/14 -- patient has no fresh complaints but has recently come with his MRI which was done on 08/12/2014. 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 Readmission: 07/21/2019 upon evaluation today patient presents for initial inspection here in our clinic concerning issues that he has been having with his bilateral feet. He has open wounds that been present at least since the beginning of the year although he does not know an exact time. He is previously had amputations of all toes and the distal portion of  his foot. 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 With that being said he does have a history of diabetes, peripheral vascular disease, nicotine dependence, and hypertension. He has not had any recent arterial or vascular studies he was noncompressible today I think he is going to require referral for arterial studies as well. 2/11; patient was readmitted to our clinic last week. He has bilateral plantar foot wounds in the setting of previous transmetatarsal amputations. He had his arterial studies done on 2/8; these were actually quite good. He was noncompressible on both sides however his waveforms were triphasic. The thought was noncompressible vessels consistent with medial calcification but without significant stenosis in the lower extremities. X-rays were done on the left this showed extensive chronic and postoperative changes without evidence of osteomyelitis. There was no evidence of osteomyelitis on the right foot there was mild vascular calcification MRI was suggested on the left based on clinical correlation. The patient arrives in clinic with some odor and drainage from the left foot. Right foot measures smaller. He is really not offloading these areas although he says he does not walk much Objective Constitutional Sitting or standing Blood Pressure is within target range for patient.. Pulse regular and within target range for patient.Marland Kitchen Respirations regular, non-labored and within target range.. Temperature is normal and within the target range for the patient.Marland Kitchen Appears in no distress. Vitals Time Taken: 8:15 AM, Height: 73 in, Weight: 180 lbs, BMI: 23.7, Temperature: 98.2 F, Pulse: 68 bpm, Respiratory Rate: 18 breaths/min, Blood Pressure: 127/57 mmHg, Capillary Blood Glucose: 98 mg/dl. General Notes: patient stated last CBG was 98 Cardiovascular Brisk dorsalis pedis pulse on the left but no posterior tibial pulse. I could not feel his peripheral pulses  on the right. Musculoskeletal Previous transmetatarsal amputations. General Notes: Wound exam; ooBoth wound areas have built up callus and soft tissue around the wound margins. Especially true in the left using a #5 curette this was knocked back on both sides removing subcutaneous tissue trying to get the edges of the wound down to the margins of the wound itself. Integumentary (Hair, Skin) He has a widespread skin issue. There are healed scars on his bilateral legs also on his arms. Etiology of this is unclear. Wound #21 status is Open. Original cause of wound was Gradually Appeared. The wound is located on the [place]. The wound measures 3cm length x 0.7cm width x 0.5cm depth; 1.649cm^2 area and 0.825cm^3 volume. There is Fat Layer (Subcutaneous Tissue) Exposed exposed. There is no tunneling or undermining noted. There is a medium amount of serosanguineous drainage noted. The wound margin is thickened. There is large (67- 100%) red, pink granulation within the wound bed. There is a small (1-33%) amount of necrotic tissue within the wound bed including Adherent Slough. Wound #22 status is Open.  Original cause of wound was Gradually Appeared. The wound is located on the Right Metatarsal head first. The wound measures 0.9cm length x 0.8cm width x 0.3cm depth; 0.565cm^2 area and 0.17cm^3 volume. There is Fat Layer (Subcutaneous Tissue) Exposed exposed. There is no tunneling or undermining noted. There is a medium amount of serosanguineous drainage noted. Foul odor after cleansing was noted. The wound margin is thickened. There is large (67-100%) pink granulation within the wound bed. There is a small (1-33%) amount of necrotic tissue within the wound bed including Adherent Slough. Assessment Active Problems ICD-10 Type 2 diabetes mellitus with foot ulcer Non-pressure chronic ulcer of other part of left foot with fat layer exposed Non-pressure chronic ulcer of other part of right  foot with fat layer exposed Other specified peripheral vascular diseases Nicotine dependence, cigarettes, with other nicotine-induced disorders Essential (primary) hypertension Corns and callosities Procedures Wound #21 Pre-procedure diagnosis of Wound #21 is a Diabetic Wound/Ulcer of the Lower Extremity located on the [place] .Severity of Tissue Pre Debridement is: Fat layer exposed. There was a Excisional Skin/Subcutaneous Tissue Debridement with a total area of 3 sq cm performed by Ricard Dillon., MD. With the following instrument(s): Curette to remove Viable and Non-Viable tissue/material. Material removed includes Callus, Subcutaneous Tissue, Slough, Skin: Dermis, and Fibrin/Exudate after achieving pain control using Lidocaine 4% Topical Solution. A time out was conducted at 08:49, prior to the start of the procedure. A Minimum amount of bleeding was controlled with Pressure. The procedure was tolerated well with a pain level of 0 throughout and a pain level of 0 following the procedure. Post Debridement Measurements: 3cm length x 0.7cm width x 0.5cm depth; 0.825cm^3 volume. Character of Wound/Ulcer Post Debridement is improved. Severity of Tissue Post Debridement is: Fat layer exposed. 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 There was a Excisional Skin/Subcutaneous Tissue Debridement with a total area of 1 sq cm performed by Ricard Dillon., MD. With the following instrument(s): Curette to remove Viable and Non-Viable tissue/material. Material removed includes Callus, Subcutaneous Tissue, Slough, Skin: Dermis, and Fibrin/Exudate after achieving pain control using Lidocaine 4% Topical Solution. A time out was conducted at 08:49, prior to the start of the  procedure. A Minimum amount of bleeding was controlled with Pressure. The procedure was tolerated well with a pain level of 0 throughout and a pain level of 0 following the procedure. Post Debridement Measurements: 0.9cm length x 0.8cm width x 0.3cm depth; 0.17cm^3 volume. Character of Wound/Ulcer Post Debridement is improved. Severity of Tissue Post Debridement is: Fat layer exposed. Post procedure Diagnosis Wound #22: Same as Pre-Procedure Plan Follow-up Appointments: Return Appointment in 1 week. - Wednesday Dressing Change Frequency: Wound #21 Left,Plantar Foot: Change dressing three times week. Wound #22 Right Metatarsal head first: Change dressing three times week. Wound Cleansing: Clean wound with Wound Cleanser May shower with protection. Primary Wound Dressing: Wound #21 Left,Plantar Foot: Calcium Alginate with Silver Wound #22 Right Metatarsal head first: Calcium Alginate with Silver Secondary Dressing: Wound #21 Left,Plantar Foot: Foam - foam donut to offload Kerlix/Rolled Gauze Dry Gauze ABD pad - as needed Wound #22 Right Metatarsal head first: Foam - foam donut to offload Kerlix/Rolled Gauze Dry Gauze ABD pad - as needed Edema Control: Avoid standing for long periods of time Elevate legs to the level of  the heart or above for 30 minutes daily and/or when sitting, a frequency of: - throughout the day. Off-Loading: Open toe surgical shoe to: - for bilateral feet. apply felt to both surgical shoes to aid in offloading. Home Health: [facility] skilled nursing for wound care. - [facility] 1. Silver alginate bilaterally being changed twice a week by home health and once by [place] 2. Debridement today of predominantly the wound edges hopefully to allow for some epithelialization. 3. Surprisingly his arterial studies were fairly benign given that he has triphasic waveforms. Some degree of medial calcification accounting for the noncompressible vessels. TBI's of  course could not be done 4. His x-rays did not show firm evidence of osteomyelitis on either side. On the left there was a suggestion of MRI based on clinical correlation however for now I think we can watch these wounds. Neither one of them look particularly infected there is no exposed bone Electronic Signature(s) Signed: 07/30/2019 1:06:39 PM By: Linton Ham MD Entered By: Linton Ham on 07/29/2019 09:13:52 -------------------------------------------------------------------------------- SuperBill Details Patient Name: Date of Service: [name], [name] 07/29/2019 Medical Record Number:[id number] Patient Account Number: [id number] Date of Birth/Sex: Treating RN: 1940/12/10 (79 y.o. [name] Primary Care Provider: [name] Other Clinician: Referring Provider: Treating Provider/Extender:Murrell Dome, Birdena Crandall, [name] in Treatment: 1 Diagnosis Coding ICD-10 Codes Code Description E11.621 Type 2 diabetes mellitus with foot ulcer L97.522 Non-pressure chronic ulcer of other part of left foot with fat layer exposed L97.512 Non-pressure chronic ulcer of other part of right foot with fat layer exposed I73.89 Other specified peripheral vascular diseases F17.218 Nicotine dependence, cigarettes, with other nicotine-induced disorders I10 Essential (primary) hypertension L84 Corns and callosities Facility Procedures CPT4 Code Description: [id number] - DEB SUBQ TISSUE 20 SQ CM/< ICD-10 Diagnosis Description L97.522 Non-pressure chronic ulcer of other part of left foot with f L97.512 Non-pressure chronic ulcer of other part of right foot with Modifier: at layer expo fat layer exp Quantity: 1 sed osed Physician Procedures CPT4 Code Description: [id number] - WC PHYS SUBQ TISS 20 SQ CM ICD-10 Diagnosis Description L97.522 Non-pressure chronic ulcer of other part of left foot with fat L97.512 Non-pressure chronic ulcer of other part of right foot with  fa Modifier: layer exposed t layer expose Quantity: 1 d Electronic Signature(s) Signed: 07/30/2019 1:06:39 PM By: Linton Ham MD Entered By: Linton Ham on 07/29/2019 09:14:37

## 2019-07-30 NOTE — Progress Notes (Signed)
RALIEGH, SCOBIE ([id number]) Visit Report for 07/29/2019 Arrival Information Details Patient Name: Date of Service: [name], [name] 07/29/2019 8:30 AM Medical Record Number:[id number] Patient Account Number: [id number] Date of Birth/Sex: Treating RN: [birth year] (79 y.o. [name] Primary Care Tilmon Wisehart: [name] Other Clinician: Referring Saurav Crumble: Treating Saxon Barich/Extender:Robson, Birdena Crandall, [name] in Treatment: 1 Visit Information History Since Last Visit Added or deleted any medications: No Patient Arrived: [name] Any new allergies or adverse reactions: No Arrival Time: 08:19 Had a fall or experienced change in No Accompanied By: self activities of daily living that may affect Transfer Assistance: None risk of falls: Patient Identification Verified: Yes Signs or symptoms of abuse/neglect since last No Secondary Verification Process Yes visito Completed: Hospitalized since last visit: No Patient Requires Transmission-Based No Implantable device outside of the clinic excluding No Precautions: cellular tissue based products placed in the center Patient Has Alerts: Yes since last visit: Patient Alerts: R ABI non Has Dressing in Place as Prescribed: Yes compressible Pain Present Now: No L ABI non compressible Electronic Signature(s) Signed: 07/29/2019 5:22:04 PM By: Kela Millin Entered By: Kela Millin on 07/29/2019 08:20:12 -------------------------------------------------------------------------------- Encounter Discharge Information Details Patient Name: Date of Service: [name], [name] 07/29/2019 8:30 AM Medical Record [id number] Patient Account Number: [id number] Date of Birth/Sex: Treating RN: [birth year] (79 y.o. [name] Primary Care Rilda Bulls: [name] Other Clinician: Referring Zaine Elsass: Treating Drue Camera/Extender:Robson, Birdena Crandall, [name] in Treatment: 1 Encounter  Discharge Information Items Post Procedure Vitals Discharge Condition: Stable Temperature (F): 98.2 Ambulatory Status: Cane Pulse (bpm): 68 Discharge Destination: Home Respiratory Rate (breaths/min): 18 Transportation: Private Auto Blood Pressure (mmHg): 121/57 Accompanied By: self Schedule Follow-up Appointment: Yes Clinical Summary of Care: Patient Declined Electronic Signature(s) Signed: 07/30/2019 5:30:09 PM By: Baruch Gouty RN, BSN Entered By: Baruch Gouty on 07/29/2019 09:28:31 -------------------------------------------------------------------------------- Lower Extremity Assessment Details Patient Name: Date of Service: [name], [name] 07/29/2019 8:30 AM Medical Record Number:[id number] Patient Account Number: [id number] Date of Birth/Sex: Treating RN: 10/28/1940 (79 y.o. [name] Primary Care Leira Regino: [name] Other Clinician: Referring Shinika Estelle: Treating Mayson Mcneish/Extender:Robson, Birdena Crandall, [name] in Treatment: 1 Edema Assessment Assessed: [Left: No] [Right: No] Edema: [Left: No] [Right: No] Calf Left: Right: Point of Measurement: 32 cm From Medial Instep 30 cm 34 cm Ankle Left: Right: Point of Measurement: 10 cm From Medial Instep 23 cm 25.5 cm Vascular Assessment Pulses: Dorsalis Pedis Palpable: [Left:Yes] [Right:Yes] Electronic Signature(s) Signed: 07/29/2019 5:22:04 PM By: Kela Millin Entered By: Kela Millin on 07/29/2019 08:22:04 -------------------------------------------------------------------------------- Multi Wound Chart Details Patient Name: Date of Service: [name], [name] 07/29/2019 8:30 AM Medical Record [id number] Patient Account Number: [id number] Date of Birth/Sex: Treating RN: 07/03/1940 (79 y.o. 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] [22:Lower Extremity] Comorbid History: [21:Cataracts, Glaucoma, Hypertension, Peripheral Hypertension, Peripheral Arterial Disease, Type II Diabetes, Osteomyelitis, Diabetes, Osteomyelitis, Neuropathy] [22:Cataracts, Glaucoma, Arterial Disease, Type II Neuropathy] [N/A:N/A] Date Acquired: [21:06/17/2018] [22:06/17/2018] [N/A:N/A] Weeks of Treatment: [21:1] [22:1] [N/A:N/A] Wound Status: [21:Open] [22:Open] [N/A:N/A] Measurements L x W x D 3x0.7x0.5 [22:0.9x0.8x0.3] [N/A:N/A] (cm) Area (cm) : [21:1.649] [22:0.565] [N/A:N/A] Volume (cm) : [  21:0.825] [22:0.17] [N/A:N/A] % Reduction in Area: [21:28.60%] [22:10.00%] [N/A:N/A] % Reduction in Volume: 28.60% [22:9.60%] [N/A:N/A] Classification: [21:Grade 2] [22:Grade 2] [N/A:N/A] Exudate Amount: [21:Medium] [22:Medium] [N/A:N/A] Exudate Type: [21:Serosanguineous] [22:Serosanguineous] [N/A:N/A] Exudate Color: [21:red, brown] [22:red, brown] [N/A:N/A] Foul Odor After Cleansing:No [22:Yes] [N/A:N/A] Odor Anticipated Due to N/A [22:No] [N/A:N/A] Product Use: Wound Margin: [21:Thickened] [22:Thickened] [N/A:N/A] Granulation Amount: [21:Large (67-100%)] [22:Large (67-100%)] [N/A:N/A] Granulation Quality: [21:Red, Pink] [22:Pink] [N/A:N/A] Necrotic Amount: [21:Small (1-33%)] [22:Small (1-33%)] [N/A:N/A] Exposed Structures: [21:Fat Layer (Subcutaneous Fat Layer (Subcutaneous N/A Tissue) Exposed: Yes Fascia: No Tendon: No Muscle: No Joint:  No Bone: No] [22:Tissue) Exposed: Yes Fascia: No Tendon: No Muscle: No Joint: No Bone: No] Epithelialization: [21:None] [22:None] [N/A:N/A] Debridement: [21:Debridement - Excisional Debridement - Excisional N/A] Pre-procedure [21:08:49] [22:08:49] [N/A:N/A] Verification/Time Out Taken: Pain Control: [21:Lidocaine 4% Topical Solution] [22:Lidocaine 4% Topical Solution] [N/A:N/A] Tissue Debrided: [21:Callus, Subcutaneous, Slough] [22:Callus, Subcutaneous, Slough] [N/A:N/A] Level: [21:Skin/Subcutaneous Tissue] [22:Skin/Subcutaneous Tissue] [N/A:N/A] Debridement Area (sq cm):3 [22:1] [N/A:N/A] Instrument: [21:Curette] [22:Curette] [N/A:N/A] Bleeding: [21:Minimum] [22:Minimum] [N/A:N/A] Hemostasis Achieved: [21:Pressure] [22:Pressure] [N/A:N/A] Procedural Pain: [21:0] [22:0] [N/A:N/A] Post Procedural Pain: [21:0] [22:0] [N/A:N/A] Debridement Treatment Procedure was tolerated [22:Procedure was tolerated] [N/A:N/A] Response: [21:well] [22:well] Post Debridement [21:3x0.7x0.5] [22:0.9x0.8x0.3] [N/A:N/A] Measurements L x W x D (cm) Post Debridement [21:0.825] [22:0.17] [N/A:N/A] Volume: (cm) Procedures Performed: Debridement [22:Debridement] [N/A:N/A] Treatment Notes Electronic Signature(s) Signed: 07/29/2019 5:57:11 PM By: Deon Pilling Signed: 07/30/2019 1:06:39 PM By: Linton Ham MD Entered By: Linton Ham on 07/29/2019 09:07:30 -------------------------------------------------------------------------------- Multi-Disciplinary Care Plan Details Patient Name: Date of Service: [name], [name] 07/29/2019 8:30 AM Medical Record Number:[id number] Patient Account Number: [id number] Date of Birth/Sex: Treating RN: 03/21/1941 (79 y.o. [name] Primary Care Christel Bai: [name] Other Clinician: Referring Jaquay Posthumus: Treating Wynn Kernes/Extender:Robson, Birdena Crandall, [name] in Treatment: 1 Active Inactive Abuse / Safety / Falls / Self Care  Management Nursing Diagnoses: Potential for falls Goals: Patient/caregiver will verbalize/demonstrate measures taken to prevent injury and/or falls Date Initiated: 07/21/2019 Target Resolution Date: 08/18/2019 Goal Status: Active Interventions: Assess fall risk on admission and as needed Assess impairment of mobility on admission and as needed per policy Notes: Nutrition Nursing Diagnoses: Impaired glucose control: actual or potential Potential for alteratiion in Nutrition/Potential for imbalanced nutrition Goals: Patient/caregiver will maintain therapeutic glucose control Date Initiated: 07/21/2019 Target Resolution Date: 08/18/2019 Goal Status: Active Interventions: Assess HgA1c results as ordered upon admission and as needed Assess patient nutrition upon admission and as needed per policy Provide education on elevated blood sugars and impact on wound healing Treatment Activities: Patient referred to Primary Care Physician for further nutritional evaluation : 07/21/2019 Notes: Wound/Skin Impairment Nursing Diagnoses: Impaired tissue integrity Knowledge deficit related to smoking impact on wound healing Knowledge deficit related to ulceration/compromised skin integrity Goals: Patient will demonstrate a reduced rate of smoking or cessation of smoking Date Initiated: 07/21/2019 Target Resolution Date: 08/18/2019 Goal Status: Active Patient/caregiver will verbalize understanding of skin care regimen Date Initiated: 07/21/2019 Date Inactivated: 07/29/2019 Target Resolution Date: 08/18/2019 Goal Status: Met Ulcer/skin breakdown will have a volume reduction of 30% by week 4 Date Initiated: 07/21/2019 Target Resolution Date: 08/18/2019 Goal Status: Active Interventions: Assess patient/caregiver ability to obtain necessary supplies Assess patient/caregiver ability to perform ulcer/skin care regimen upon admission and as needed Assess ulceration(s) every visit Provide education on smoking Provide  education on ulcer and skin care Treatment Activities: Skin care regimen initiated : 07/21/2019 Topical wound management initiated : 07/21/2019 Notes: Electronic Signature(s) Signed: 07/29/2019 5:57:11 PM By: Deon Pilling Entered By: Deon Pilling on 07/29/2019  08:12:50 -------------------------------------------------------------------------------- Pain Assessment Details Patient Name: Date of Service: [name], [name] 07/29/2019 8:30 AM Medical Record Number:[id number] Patient Account Number: [id number] Date of Birth/Sex: Treating RN: 12/26/1940 (79 y.o. [name] Primary Care Zinia Innocent: [name] Other Clinician: Referring Caelyn Route: Treating Kaley Jutras/Extender:Robson, Birdena Crandall, [name] in Treatment: 1 Active Problems Location of Pain Severity and Description of Pain Patient Has Paino No Site Locations Pain Management and Medication Current Pain Management: Electronic Signature(s) Signed: 07/29/2019 5:22:04 PM By: Kela Millin Entered By: Kela Millin on 07/29/2019 08:21:04 -------------------------------------------------------------------------------- Patient/Caregiver Education Details Patient Name: Date of Service: [name], [name] 2/11/2021andnbsp8:30 AM Medical Record [id number] Patient Account Number: [id number] Date of Birth/Gender: Treating RN: 04/11/1941 (78 y.o. [name] Primary Care Physician: [name] Other Clinician: Referring Physician: Treating Physician/Extender:Robson, Birdena Crandall, [name] in Treatment: 1 Education Assessment Education Provided To: Patient Education Topics Provided Wound/Skin Impairment: Handouts: Skin Care Do's and Dont's Methods: Explain/Verbal Responses: Reinforcements needed Electronic Signature(s) Signed: 07/29/2019 5:57:11 PM By: Deon Pilling Entered By: Deon Pilling on 07/29/2019  08:13:01 -------------------------------------------------------------------------------- Wound Assessment Details Patient Name: Date of Service: [name], [name] 07/29/2019 8:30 AM Medical Record Number:[id number] Patient Account Number: [id number] Date of Birth/Sex: Treating RN: [birth year] (79 y.o. [name] Primary Care Jessica Checketts: [name] Other Clinician: Referring Billie Trager: Treating Jaishawn Witzke/Extender:Robson, Birdena Crandall, [name] in Treatment: 1 Wound Status Wound Number: 21 Primary Diabetic Wound/Ulcer of the Lower Extremity Etiology: Wound Location: Left Foot - Plantar Wound Open Wounding Event: Gradually Appeared Status: Date Acquired: 06/17/2018 Comorbid Cataracts, Glaucoma, Hypertension, Weeks Of Treatment: 1 History: Peripheral Arterial Disease, Type II Diabetes, Clustered Wound: No Osteomyelitis, Neuropathy Wound Measurements Length: (cm) 3 Width: (cm) 0.7 Depth: (cm) 0.5 Area: (cm) 1.649 Volume: (cm) 0.825 Wound Description Classification: Grade 2 Wound Margin: Thickened Exudate Amount: Medium Exudate Type: Serosanguineous Exudate Color: red, brown Wound Bed Granulation Amount: Large (67-100%) Granulation Quality: Red, Pink Necrotic Amount: Small (1-33%) Necrotic Quality: Adherent Slough After Cleansing: No rino Yes Exposed Structure osed: No (Subcutaneous Tissue) Exposed: Yes osed: No osed: No sed: No ed: No % Reduction in Area: 28.6% % Reduction in Volume: 28.6% Epithelialization: None Tunneling: No Undermining: No Foul Odor Slough/Fib Fascia Exp Fat Layer Tendon Exp Muscle Exp Joint Expo Bone Expos Treatment Notes Wound #21 (Left, Plantar Foot) 2. Periwound Care Moisturizing lotion 3. Primary Dressing Applied Calcium Alginate Ag 4. Secondary Dressing Dry Gauze Roll Gauze Foam 7. Footwear/Offloading device applied Felt/Foam Surgical shoe Electronic Signature(s) Signed: 07/29/2019 5:22:04 PM  By: Kela Millin Entered By: Kela Millin on 07/29/2019 08:25:02 -------------------------------------------------------------------------------- Wound Assessment Details Patient Name: Date of Service: [name], [name] 07/29/2019 8:30 AM Medical Record Number:[id number] Patient Account Number: [id number] Date of Birth/Sex: Treating RN: 25-Jul-1940 (79 y.o. [name] Primary Care Willean Schurman: [name] Other Clinician: Referring Demaryius Imran: Treating Eleana Tocco/Extender:Robson, Birdena Crandall, [name] in Treatment: 1 Wound Status Wound Number: 22 Primary Diabetic Wound/Ulcer of the Lower Extremity Etiology: Wound Location: Right Metatarsal head first Wound Open Wounding Event: Gradually Appeared Status: Date Acquired: 06/17/2018 Comorbid Cataracts, Glaucoma, Hypertension, Weeks Of Treatment: 1 History: Peripheral Arterial Disease, Type II Diabetes, Clustered Wound: No Osteomyelitis, Neuropathy Wound Measurements Length: (cm) 0.9 Width: (cm) 0.8 Depth: (cm) 0.3 Area: (cm) 0.565 Volume: (cm) 0.17 Wound Description Classification: Grade 2 Wound Margin: Thickened Exudate Amount: Medium Exudate Type: Serosanguineous Exudate Color: red, brown Wound Bed Granulation Amount: Large (67-100%) Granulation Quality: Pink Necrotic Amount: Small (1-33%) Necrotic Quality: Adherent Slough Foul Odor After Cleansing: Y Due to Product Use: N Slough/Fibrino Y Exposed Structure Fascia Exposed:  N Fat Layer (Subcutaneous Tissue) Exposed: Y Tendon Exposed: N Muscle Exposed: N Joint Exposed: N Bone Exposed: N % Reduction in Area: 10% % Reduction in Volume: 9.6% Epithelialization: None Tunneling: No Undermining: No es o es o es o o o o Treatment Notes Wound #22 (Right Metatarsal head first) 2. Periwound Care Moisturizing lotion 3. Primary Dressing Applied Calcium Alginate Ag 4. Secondary Dressing Dry Gauze Roll Gauze Foam 7.  Footwear/Offloading device applied Felt/Foam Surgical shoe Electronic Signature(s) Signed: 07/29/2019 5:22:04 PM By: Kela Millin Entered By: Kela Millin on 07/29/2019 08:25:24 -------------------------------------------------------------------------------- Vitals Details Patient Name: Date of Service: [name], [name] 07/29/2019 8:30 AM Medical Record [id number] Patient Account Number: [id number] Date of Birth/Sex: Treating RN: [birth year] (79 y.o. [name] Primary Care Quaron Delacruz: [name] Other Clinician: Referring Xyler Terpening: Treating D[employer]elle [employer]/Extender:Robson, Birdena Crandall, [name] in Treatment: 1 Vital Signs Time Taken: 08:15 Temperature (F): 98.2 Height (in): 73 Pulse (bpm): 68 Weight (lbs): 180 Respiratory Rate (breaths/min): 18 Body Mass Index (BMI): 23.7 Blood Pressure (mmHg): 127/57 Capillary Blood Glucose (mg/dl): 98 Reference Range: 80 - 120 mg / dl Notes patient stated last CBG was 98 Electronic Signature(s) Signed: 07/29/2019 5:22:04 PM By: Kela Millin Entered By: Kela Millin on 07/29/2019 08:20:59

## 2019-08-03 ENCOUNTER — Ambulatory Visit: Payer: Medicare Other | Admitting: Family

## 2019-08-04 ENCOUNTER — Encounter (HOSPITAL_BASED_OUTPATIENT_CLINIC_OR_DEPARTMENT_OTHER): Payer: Medicare Other | Admitting: Physician Assistant

## 2019-08-05 ENCOUNTER — Encounter (HOSPITAL_BASED_OUTPATIENT_CLINIC_OR_DEPARTMENT_OTHER): Payer: Medicare Other | Admitting: Internal Medicine

## 2019-08-12 ENCOUNTER — Encounter (HOSPITAL_BASED_OUTPATIENT_CLINIC_OR_DEPARTMENT_OTHER): Payer: Medicare Other | Admitting: Internal Medicine

## 2019-08-12 ENCOUNTER — Other Ambulatory Visit: Payer: Self-pay

## 2019-08-12 DIAGNOSIS — E11621 Type 2 diabetes mellitus with foot ulcer: Secondary | ICD-10-CM | POA: Diagnosis not present

## 2019-08-12 NOTE — Progress Notes (Signed)
[name], [name] ([id number]) Visit Report for 08/12/2019 Debridement Details Patient Name: Date of Service: [name], [name] 08/12/2019 8:15 AM Medical Record Number:[id number] Patient Account Number: [id number] Date of Birth/Sex: Treating RN: [birth year] (79 y.o. [name] Primary Care Provider: Leeroy [name] Other Clinician: Referring Provider: Leeroy [name] Treating Provider/Extender:[name], [name] in Treatment: 3 Debridement Performed for Wound #21 Left,Plantar Foot Assessment: Performed By: Physician [name]., MD Debridement Type: Debridement Severity of Tissue Pre Fat layer exposed Debridement: Level of Consciousness (Pre- Awake and Alert procedure): Pre-procedure Verification/Time Out Taken: Yes - 09:15 Start Time: 09:16 Pain Control: Lidocaine 4% Topical Solution Total Area Debrided (L x W): 0.7 (cm) x 0.7 (cm) = 0.49 (cm) Tissue and other material Viable, Non-Viable, Callus, Subcutaneous, Fibrin/Exudate debrided: Level: Skin/Subcutaneous Tissue Debridement Description: Excisional Instrument: Curette Specimen: Swab, Number of Specimens Taken: 1 Bleeding: Moderate Hemostasis Achieved: Pressure End Time: 09:19 Procedural Pain: 0 Post Procedural Pain: 0 Response to Treatment: Procedure was tolerated well Level of Consciousness Awake and Alert (Post-procedure): Post Debridement Measurements of Total Wound Length: (cm) 0.7 Width: (cm) 0.3 Depth: (cm) 0.4 Volume: (cm) 0.066 Character of Wound/Ulcer Post Improved Debridement: Severity of Tissue Post Debridement: Fat layer exposed Post Procedure Diagnosis Same as Pre-procedure Electronic Signature(s) Signed: 08/12/2019 5:41:09 PM By: Casey Ham MD Signed: 08/12/2019 6:07:18 PM By: [name] Entered By: [name] on 08/12/2019 09:59:44 -------------------------------------------------------------------------------- Debridement Details Patient Name: Date of  Service: [name], [name] 08/12/2019 8:15 AM Medical Record [id number] Patient Account Number: [id number] Date of Birth/Sex: Treating RN: 1941-03-19 (79 y.o. [name] Primary Care Provider: Leeroy [name] Other Clinician: Referring Provider: Leeroy [name] Treating Provider/Extender:[name], [name] in Treatment: 3 Debridement Performed for Wound #22 Right Metatarsal head first Assessment: Performed By: Physician [name]., MD Debridement Type: Debridement Severity of Tissue Pre Fat layer exposed Debridement: Level of Consciousness (Pre- Awake and Alert procedure): Pre-procedure Verification/Time Out Taken: Yes - 09:15 Start Time: 09:16 Pain Control: Lidocaine 4% Topical Solution Total Area Debrided (L x W): 0.5 (cm) x 0.8 (cm) = 0.4 (cm) Tissue and other material Viable, Non-Viable, Callus, Subcutaneous, Fibrin/Exudate debrided: Level: Skin/Subcutaneous Tissue Debridement Description: Excisional Instrument: Curette Specimen: Swab, Number of Specimens Taken: 1 Bleeding: Moderate Hemostasis Achieved: Pressure End Time: 09:19 Procedural Pain: 0 Post Procedural Pain: 0 Response to Treatment: Procedure was tolerated well Level of Consciousness Awake and Alert (Post-procedure): Post Debridement Measurements of Total Wound Length: (cm) 0.5 Width: (cm) 0.8 Depth: (cm) 0.4 Volume: (cm) 0.126 Character of Wound/Ulcer Post Improved Debridement: Severity of Tissue Post Debridement: Fat layer exposed Post Procedure Diagnosis Same as Pre-procedure Electronic Signature(s) Signed: 08/12/2019 5:41:09 PM By: Casey Ham MD Signed: 08/12/2019 6:07:18 PM By: [name] Entered By: [name] on 08/12/2019 09:59:52 -------------------------------------------------------------------------------- HPI Details Patient Name: Date of Service: [name], [name] 08/12/2019 8:15 AM Medical Record [id number] Patient Account Number:  [id number] Date of Birth/Sex: Treating RN: August 25, 1940 (79 y.o. [name] Primary Care Provider: Leeroy [name] Other Clinician: Referring Provider: Treating Provider/Extender:[name], [name], [name] in Treatment: 3 History of Present Illness HPI Description: very pleasant 79 year old gentleman has type 2 diabetes and has been having an ulcer on his foot for several years. Been seeing as this time around since the end of October. Classified as a Wagner grade 2 because his previous x-rays did not show any problems. He has an MRI pending this Friday. discussing with him he says he has not been able to get his diabetic shoes yet. He continues to smoke his pipe and drinks  moonshine and says he will not give this up. I have tried to counsel him regarding this but he firmly says that he is happy to listen to me but he is not going to give up his habits. 08/17/14 -- patient has no fresh complaints but has recently come with his MRI which was done on 08/12/2014. 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 Readmission: 07/21/2019 upon evaluation today patient presents for initial inspection here in our clinic concerning issues that he has been having with his bilateral feet. He has open wounds that been present at least since the beginning of the year although he does not know an exact time. He is previously had amputations of all toes and the distal portion of his foot. 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 With that being said he does have a history of diabetes, peripheral vascular disease, nicotine dependence, and hypertension. He has not had any recent arterial or vascular studies he was noncompressible today I think he is going to require referral for arterial studies as well. 2/11; patient was readmitted to our clinic last week. He has  bilateral plantar foot wounds in the setting of previous transmetatarsal amputations. He had his arterial studies done on 2/8; these were actually quite good. He was noncompressible on both sides however his waveforms were triphasic. The thought was noncompressible vessels consistent with medial calcification but without significant stenosis in the lower extremities. X-rays were done on the left this showed extensive chronic and postoperative changes without evidence of osteomyelitis. There was no evidence of osteomyelitis on the right foot there was mild vascular calcification MRI was suggested on the left based on clinical correlation. The patient arrives in clinic with some odor and drainage from the left foot. Right foot measures smaller. He is really not offloading these areas although he says he does not walk much 2/25; the patient arrives with some odor and drainage from the left foot again. Post debridement I cultured this area. He has the area on the midfoot roughly the third metatarsal head and the first metatarsal head on the right. He is in surgical shoes bilateral transmet Electronic Signature(s) Signed: 08/12/2019 5:41:09 PM By: Casey Ham MD Entered By: [name] on 08/12/2019 10:02:16 -------------------------------------------------------------------------------- Physical Exam Details Patient Name: Date of Service: [name], [name] 08/12/2019 8:15 AM Medical Record [id number] Patient Account Number: [id number] Date of Birth/Sex: Treating RN: 1940-11-25 (79 y.o. [name] Primary Care Provider: Leeroy [name] Other Clinician: Referring Provider: Treating Provider/Extender:Gabriele Zwilling, [name], [name] in Treatment: 3 Constitutional Sitting or standing Blood Pressure is within target range for patient.. Pulse regular and within target range for patient.Marland Kitchen Respirations regular, non-labored and within target range.. Temperature is  normal and within the target range for the patient.Marland Kitchen Appears in no distress. Notes Wound exam Both wound areas have built up callus around the wound edges. Extensive debridement today using a #5 curette removing callus subcutaneous tissue from around the wound margins. Electronic Signature(s) Signed: 08/12/2019 5:41:09 PM By: Casey Ham MD Entered By: [name] on 08/12/2019 10:02:51 -------------------------------------------------------------------------------- Physician Orders Details Patient Name: Date of Service: [name], [name] 08/12/2019 8:15 AM Medical Record Number:[id number] Patient Account Number: [id number] Date of Birth/Sex: Treating RN: 1940/07/24 (79 y.o. [name] Primary Care Provider: Leeroy [name] Other Clinician: Referring Provider: Treating Provider/Extender:Pamalee Marcoe, [name], [name] in Treatment: 3 Verbal / Phone Orders: No Diagnosis Coding ICD-10 Coding Code  Description E11.621 Type 2 diabetes mellitus with foot ulcer L97.522 Non-pressure chronic ulcer of other part of left foot with fat layer exposed L97.512 Non-pressure chronic ulcer of other part of right foot with fat layer exposed I73.89 Other specified peripheral vascular diseases F17.218 Nicotine dependence, cigarettes, with other nicotine-induced disorders I10 Essential (primary) hypertension L84 Corns and callosities Follow-up Appointments Return Appointment in 1 week. - Wednesday Dressing Change Frequency Wound #21 Left,Plantar Foot Change dressing three times week. Wound #22 Right Metatarsal head first Change dressing three times week. Wound Cleansing Clean wound with Wound Cleanser May shower with protection. Primary Wound Dressing Wound #21 Left,Plantar Foot Calcium Alginate with Silver Wound #22 Right Metatarsal head first Calcium Alginate with Silver Secondary Dressing Wound #21 Left,Plantar Foot Foam - foam donut to offload Kerlix/Rolled  Gauze Dry Gauze ABD pad - as needed Wound #22 Right Metatarsal head first Foam - foam donut to offload Kerlix/Rolled Gauze Dry Gauze ABD pad - as needed Edema Control Avoid standing for long periods of time Elevate legs to the level of the heart or above for 30 minutes daily and/or when sitting, a frequency of: - throughout the day. Off-Loading Open toe surgical shoe to: - for bilateral feet. apply felt to both surgical shoes to aid in offloading. [place] skilled nursing for wound care. - [facility] Bacteria identified in Unspecified specimen by Anaerobe culture (MICRO) - culture of left foot wound. someone will call you with any abnormal results. LOINC Code: [id number] Convenience Name: Anerobic culture Electronic Signature(s) Signed: 08/12/2019 5:41:09 PM By: Casey Ham MD Signed: 08/12/2019 6:07:18 PM By: [name] Entered By: [name] on 08/12/2019 09:21:23 -------------------------------------------------------------------------------- Problem List Details Patient Name: Date of Service: [name], [name] 08/12/2019 8:15 AM Medical Record [id number] Patient Account Number: [id number] Date of Birth/Sex: Treating RN: 08-30-1940 (79 y.o. [name], Meta.Reding Primary Care Provider: Leeroy [name] Other Clinician: Referring Provider: Treating Provider/Extender:Samhita Kretsch, [name], [name] in Treatment: 3 Active Problems ICD-10 Evaluated Encounter Code Description Active Date Today Diagnosis E11.621 Type 2 diabetes mellitus with foot ulcer 07/21/2019 No Yes L97.522 Non-pressure chronic ulcer of other part of left foot 07/21/2019 No Yes with fat layer exposed L97.512 Non-pressure chronic ulcer of other part of right foot 07/21/2019 No Yes with fat layer exposed I73.89 Other specified peripheral vascular diseases 07/21/2019 No Yes F17.218 Nicotine dependence, cigarettes, with other nicotine- 07/21/2019 No Yes induced  disorders I10 Essential (primary) hypertension 07/21/2019 No Yes L84 Corns and callosities 07/21/2019 No Yes Inactive Problems Resolved Problems Electronic Signature(s) Signed: 08/12/2019 5:41:09 PM By: Casey Ham MD Entered By: [name] on 08/12/2019 09:59:23 -------------------------------------------------------------------------------- Progress Note Details Patient Name: Date of Service: [name], [name] 08/12/2019 8:15 AM Medical Record [id number] Patient Account Number: [id number] Date of Birth/Sex: Treating RN: July 28, 1940 (79 y.o. [name] Primary Care Provider: Leeroy [name] Other Clinician: Referring Provider: Treating Provider/Extender:Cleo Villamizar, [name], [name] in Treatment: 3 Subjective History of Present Illness (HPI) very pleasant 79 year old gentleman has type 2 diabetes and has been having an ulcer on his foot for several years. Been seeing as this time around since the end of October. Classified as a Wagner grade 2 because his previous x- rays did not show any problems. He has an MRI pending this Friday. discussing with him he says he has not been able to get his diabetic shoes yet. He continues to smoke his pipe and drinks moonshine and says he will not give this up. I have tried to counsel him regarding this  but he firmly says that he is happy to listen to me but he is not going to give up his habits. 08/17/14 -- patient has no fresh complaints but has recently come with his MRI which was done on 08/12/2014. 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 Readmission: 07/21/2019 upon evaluation today patient presents for initial inspection here in our clinic concerning issues that he has been having with his bilateral feet. He has open wounds that been present at least since the beginning of the year although he does not know  an exact time. He is previously had amputations of all toes and the distal portion of his foot. 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 With that being said he does have a history of diabetes, peripheral vascular disease, nicotine dependence, and hypertension. He has not had any recent arterial or vascular studies he was noncompressible today I think he is going to require referral for arterial studies as well. 2/11; patient was readmitted to our clinic last week. He has bilateral plantar foot wounds in the setting of previous transmetatarsal amputations. He had his arterial studies done on 2/8; these were actually quite good. He was noncompressible on both sides however his waveforms were triphasic. The thought was noncompressible vessels consistent with medial calcification but without significant stenosis in the lower extremities. X-rays were done on the left this showed extensive chronic and postoperative changes without evidence of osteomyelitis. There was no evidence of osteomyelitis on the right foot there was mild vascular calcification MRI was suggested on the left based on clinical correlation. The patient arrives in clinic with some odor and drainage from the left foot. Right foot measures smaller. He is really not offloading these areas although he says he does not walk much 2/25; the patient arrives with some odor and drainage from the left foot again. Post debridement I cultured this area. He has the area on the midfoot roughly the third metatarsal head and the first metatarsal head on the right. He is in surgical shoes bilateral transmet Objective Constitutional Sitting or standing Blood Pressure is within target range for patient.. Pulse regular and within target range for patient.Marland Kitchen Respirations regular, non-labored and within target range.. Temperature is normal and within the target range for the patient.Marland Kitchen Appears in no distress. Vitals Time Taken:  8:30 AM, Height: 73 in, Weight: 180 lbs, BMI: 23.7, Temperature: 98 F, Pulse: 69 bpm, Respiratory Rate: 18 breaths/min, Blood Pressure: 128/74 mmHg. General Notes: patient stated he did not check his CBG this morning General Notes: Wound exam ooBoth wound areas have built up callus around the wound edges. Extensive debridement today using a #5 curette removing callus subcutaneous tissue from around the wound margins. Integumentary (Hair, Skin) Wound #21 status is Open. Original cause of wound was Gradually Appeared. The wound is located on the [place]. The wound measures 0.7cm length x 0.3cm width x 0.4cm depth; 0.165cm^2 area and 0.066cm^3 volume. There is Fat Layer (Subcutaneous Tissue) Exposed exposed. There is no tunneling or undermining noted. There is a medium amount of serosanguineous drainage noted. The wound margin is thickened. There is large (67- 100%) red, pink granulation within the wound bed. There is no necrotic tissue within the wound bed. Wound #22 status is Open. Original cause of wound was Gradually Appeared. The wound is located on the Right Metatarsal head first. The wound measures 0.5cm length x 0.8cm width x  0.4cm depth; 0.314cm^2 area and 0.126cm^3 volume. There is Fat Layer (Subcutaneous Tissue) Exposed exposed. There is no tunneling or undermining noted. There is a medium amount of serosanguineous drainage noted. Foul odor after cleansing was noted. The wound margin is thickened. There is large (67-100%) pink granulation within the wound bed. There is a small (1-33%) amount of necrotic tissue within the wound bed including Adherent Slough. Assessment Active Problems ICD-10 Type 2 diabetes mellitus with foot ulcer Non-pressure chronic ulcer of other part of left foot with fat layer exposed Non-pressure chronic ulcer of other part of right foot with fat layer exposed Other specified peripheral vascular diseases Nicotine dependence, cigarettes, with other  nicotine-induced disorders Essential (primary) hypertension Corns and callosities Procedures Wound #21 Pre-procedure diagnosis of Wound #21 is a Diabetic Wound/Ulcer of the Lower Extremity located on the [place] .Severity of Tissue Pre Debridement is: Fat layer exposed. There was a Excisional Skin/Subcutaneous Tissue Debridement with a total area of 0.49 sq cm performed by [name]., MD. With the following instrument(s): Curette to remove Viable and Non-Viable tissue/material. Material removed includes Callus, Subcutaneous Tissue, and Fibrin/Exudate after achieving pain control using Lidocaine 4% Topical Solution. 1 specimen was taken by a Swab and sent to the lab per facility protocol. A time out was conducted at 09:15, prior to the start of the procedure. A Moderate amount of bleeding was controlled with Pressure. The procedure was tolerated well with a pain level of 0 throughout and a pain level of 0 following the procedure. Post Debridement Measurements: 0.7cm length x 0.3cm width x 0.4cm depth; 0.066cm^3 volume. Character of Wound/Ulcer Post Debridement is improved. Severity of Tissue Post Debridement is: Fat layer exposed. 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 There was a Excisional Skin/Subcutaneous Tissue Debridement with a total area of 0.4 sq cm performed by [name]., MD. With the following instrument(s): Curette to remove Viable and Non-Viable tissue/material. Material removed includes Callus, Subcutaneous Tissue, and Fibrin/Exudate after achieving pain control using Lidocaine 4% Topical Solution. 1 specimen was taken by a Swab and sent to the lab per facility protocol. A time out was conducted at 09:15, prior to the start of the  procedure. A Moderate amount of bleeding was controlled with Pressure. The procedure was tolerated well with a pain level of 0 throughout and a pain level of 0 following the procedure. Post Debridement Measurements: 0.5cm length x 0.8cm width x 0.4cm depth; 0.126cm^3 volume. Character of Wound/Ulcer Post Debridement is improved. Severity of Tissue Post Debridement is: Fat layer exposed. Post procedure Diagnosis Wound #22: Same as Pre-Procedure Plan Follow-up Appointments: Return Appointment in 1 week. - Wednesday Dressing Change Frequency: Wound #21 Left,Plantar Foot: Change dressing three times week. Wound #22 Right Metatarsal head first: Change dressing three times week. Wound Cleansing: Clean wound with Wound Cleanser May shower with protection. Primary Wound Dressing: Wound #21 Left,Plantar Foot: Calcium Alginate with Silver Wound #22 Right Metatarsal head first: Calcium Alginate with Silver Secondary Dressing: Wound #21 Left,Plantar Foot: Foam - foam donut to offload Kerlix/Rolled Gauze Dry Gauze ABD pad - as needed Wound #22 Right Metatarsal head first: Foam - foam donut to offload Kerlix/Rolled Gauze Dry Gauze ABD pad - as needed Edema Control: Avoid standing for long periods of time Elevate legs to the level of the heart  or above for 30 minutes daily and/or when sitting, a frequency of: - throughout the day. Off-Loading: Open toe surgical shoe to: - for bilateral feet. apply felt to both surgical shoes to aid in offloading. Home Health: [place] [facility] for wound care. - [facility] ordered were: Anerobic culture - culture of left foot wound. someone will call you with any abnormal results. 1. Extensive debridements today to see if we can get viable margins around the wound bed 2. Silver alginate bilaterally to continue. Electronic Signature(s) Signed: 08/12/2019 5:41:09 PM By: Casey Ham MD Entered By: [name] on  08/12/2019 10:04:07 -------------------------------------------------------------------------------- SuperBill Details Patient Name: Date of Service: [name], [name] 08/12/2019 Medical Record Number:[id number] Patient Account Number: [id number] Date of Birth/Sex: Treating RN: Jul 26, 1940 (79 y.o. [name] Primary Care Provider: Leeroy [name] Other Clinician: Referring Provider: Treating Provider/Extender:Charo Philipp, [name], [name] in Treatment: 3 Diagnosis Coding ICD-10 Codes Code Description E11.621 Type 2 diabetes mellitus with foot ulcer L97.522 Non-pressure chronic ulcer of other part of left foot with fat layer exposed L97.512 Non-pressure chronic ulcer of other part of right foot with fat layer exposed I73.89 Other specified peripheral vascular diseases F17.218 Nicotine dependence, cigarettes, with other nicotine-induced disorders I10 Essential (primary) hypertension L84 Corns and callosities Facility Procedures CPT4 Code: [id number] 1 Description: [id number] - DEB SUBQ TISSUE 20 SQ CM/< ICD-10 Diagnosis Description E11.621 Type 2 diabetes mellitus with foot ulcer Modifier: Quantity: 1 Physician Procedures CPT4 Code: [id number] Description: [id number] - WC PHYS SUBQ TISS 20 SQ CM ICD-10 Diagnosis Description E11.621 Type 2 diabetes mellitus with foot ulcer Modifier: Quantity: 1 Electronic Signature(s) Signed: 08/12/2019 5:41:09 PM By: Casey Ham MD Entered By: [name] on 08/12/2019 10:05:32
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
## 2019-08-14 LAB — AEROBIC CULTURE W GRAM STAIN (SUPERFICIAL SPECIMEN)

## 2019-08-19 ENCOUNTER — Encounter (HOSPITAL_BASED_OUTPATIENT_CLINIC_OR_DEPARTMENT_OTHER): Payer: Medicare Other | Attending: Internal Medicine | Admitting: Internal Medicine

## 2019-08-19 ENCOUNTER — Other Ambulatory Visit: Payer: Self-pay

## 2019-08-19 DIAGNOSIS — F17218 Nicotine dependence, cigarettes, with other nicotine-induced disorders: Secondary | ICD-10-CM | POA: Insufficient documentation

## 2019-08-19 DIAGNOSIS — E1151 Type 2 diabetes mellitus with diabetic peripheral angiopathy without gangrene: Secondary | ICD-10-CM | POA: Insufficient documentation

## 2019-08-19 DIAGNOSIS — I10 Essential (primary) hypertension: Secondary | ICD-10-CM | POA: Insufficient documentation

## 2019-08-19 DIAGNOSIS — L84 Corns and callosities: Secondary | ICD-10-CM | POA: Diagnosis not present

## 2019-08-19 DIAGNOSIS — L97512 Non-pressure chronic ulcer of other part of right foot with fat layer exposed: Secondary | ICD-10-CM | POA: Insufficient documentation

## 2019-08-19 DIAGNOSIS — L97522 Non-pressure chronic ulcer of other part of left foot with fat layer exposed: Secondary | ICD-10-CM | POA: Diagnosis not present

## 2019-08-19 DIAGNOSIS — E11621 Type 2 diabetes mellitus with foot ulcer: Secondary | ICD-10-CM | POA: Diagnosis present

## 2019-08-19 NOTE — Progress Notes (Addendum)
MELBURN, TREIBER ([id number]) Visit Report for 08/19/2019 Arrival Information Details Patient Name: Date of Service: NHIA, HEAPHY 08/19/2019 8:15 AM Medical Record Number:[id number] Patient Account Number: [id number] Date of Birth/Sex: Treating RN: Feb 17, 1941 (79 y.o. [name] Primary Care Oluwatimileyin Vivier: [name] Other Clinician: Referring Kristyna Bradstreet: Treating Kashauna Celmer/Extender:Robson, Birdena Crandall, [name] in Treatment: 4 Visit Information History Since Last Visit Added or deleted any No Patient Arrived: Cane medications: Arrival Time: 08:15 Any new allergies or adverse No Accompanied By: self reactions: Transfer Assistance: None Had a fall or experienced change No Patient Identification Verified: Yes in Secondary Verification Process Yes activities of daily living that may Completed: affect Patient Requires Transmission- No risk of falls: Based Precautions: Signs or symptoms of No Patient Has Alerts: Yes abuse/neglect since last visito Patient Alerts: R ABI non Hospitalized since last visit: No compressible Implantable device outside of the No L ABI non clinic excluding compressible cellular tissue based products placed in the center since last visit: Has Dressing in Place as Yes Prescribed: Has Footwear/Offloading in Place Yes as Prescribed: Left: Surgical Shoe with Pressure Relief Insole Right: Surgical Shoe with Pressure Relief Insole Pain Present Now: No Notes per patient does not walk well with surgical shoes. per patient feels he may fall. MD made aware. Electronic Signature(s) Signed: 08/19/2019 6:05:23 PM By: Deon Pilling Entered By: Deon Pilling on 08/19/2019 08:26:39 -------------------------------------------------------------------------------- Lower Extremity Assessment Details Patient Name: Date of Service: JONTHAN, LEITE 08/19/2019 8:15 AM Medical Record Number:[id number] Patient Account Number: [id number] Date of  Birth/Sex: Treating RN: 1940/07/14 (79 y.o. [name] Primary Care Landa Mullinax: [name] Other Clinician: Referring Dariush Mcnellis: Treating Punam Broussard/Extender:Robson, Birdena Crandall, [name] in Treatment: 4 Edema Assessment Assessed: [Left: Yes] [Right: Yes] Edema: [Left: Yes] [Right: No] Calf Left: Right: Point of Measurement: 32 cm From Medial Instep 32 cm 35 cm Ankle Left: Right: Point of Measurement: 10 cm From Medial Instep 22 cm 24 cm Vascular Assessment Pulses: Dorsalis Pedis Palpable: [Left:Yes] [Right:Yes] Electronic Signature(s) Signed: 08/19/2019 6:05:23 PM By: Deon Pilling Entered By: Deon Pilling on 08/19/2019 08:27:22 -------------------------------------------------------------------------------- Multi Wound Chart Details Patient Name: Date of Service: CRAVEN, CREAN 08/19/2019 8:15 AM Medical Record [id number] Patient Account Number: [id number] Date of Birth/Sex: Treating RN: 06-10-1941 (79 y.o. 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] [22:Lower Extremity] Comorbid History: [21:Cataracts, Glaucoma, Hypertension, Peripheral Hypertension, Peripheral Arterial Disease, Type II Diabetes, Osteomyelitis, Diabetes, Osteomyelitis, Neuropathy] [22:Cataracts, Glaucoma, Arterial Disease, Type II  Neuropathy] [N/A:N/A] Date Acquired: [21:06/17/2018] [22:06/17/2018] [N/A:N/A] Weeks of Treatment: [21:4] [22:4] [N/A:N/A] Wound Status: [21:Open] [22:Open] [N/A:N/A] Measurements L x W x D 1.1x0.4x0.4 [22:0.8x0.9x0.4] [N/A:N/A] (cm) Area (cm) : [21:0.346] [22:0.565] [N/A:N/A] Volume (cm) : [21:0.138] [22:0.226] [N/A:N/A] % Reduction in Area: [21:85.00%] [22:10.00%] [N/A:N/A] % Reduction in Volume: 88.10% [22:-20.20%] [N/A:N/A] Classification: [21:Grade 2] [22:Grade 2] [N/A:N/A] Exudate Amount: [21:Medium] [22:Medium] [N/A:N/A] Exudate Type: [21:Serosanguineous] [22:Serosanguineous] [N/A:N/A] Exudate Color: [21:red, brown] [22:red, brown] [N/A:N/A] Wound Margin: [21:Thickened] [22:Thickened] [N/A:N/A] Granulation Amount: [21:Large (67-100%)] [22:Large (67-100%)] [N/A:N/A] Granulation Quality: [21:Red, Pink] [22:Pink] [N/A:N/A] Necrotic Amount: [21:None Present (0%)] [22:Small (1-33%)] [N/A:N/A] Exposed Structures: [21:Fat Layer (Subcutaneous Fat Layer (  Subcutaneous N/A Tissue) Exposed: Yes Fascia: No Tendon: No Muscle: No Joint: No Bone: No] [22:Tissue) Exposed: Yes Fascia: No Tendon: No Muscle: No Joint: No Bone: No] Epithelialization: [21:None] [22:None] [N/A:N/A] Debridement: [21:Debridement - Excisional Debridement - Excisional N/A] Pre-procedure [21:08:32] [22:08:32] [N/A:N/A] Verification/Time Out Taken: Pain Control: [21:Lidocaine 5% topical ointment] [22:Lidocaine 5% topical ointment] [N/A:N/A] Tissue Debrided: [21:Callus, Subcutaneous] [22:Callus, Subcutaneous] [N/A:N/A] Level: [21:Skin/Subcutaneous Tissue Skin/Subcutaneous Tissue N/A] Debridement Area (sq cm):0.44 [22:0.72] [N/A:N/A] Instrument: [21:Curette] [22:Curette] [N/A:N/A] Bleeding: [21:Minimum] [22:Minimum] [N/A:N/A] Hemostasis Achieved: [21:Pressure] [22:Pressure] [N/A:N/A] Procedural Pain: [21:0] [22:0] [N/A:N/A] Post Procedural Pain: [21:0] [22:0] [N/A:N/A] Debridement Treatment Procedure was tolerated  [22:Procedure was tolerated] [N/A:N/A] Response: [21:well] [22:well] Post Debridement [21:1.1x0.4x0.4] [22:0.8x0.9x0.4] [N/A:N/A] Measurements L x W x D (cm) Post Debridement [34:7.425] [95:6.387] [N/A:N/A] Volume: (cm) Assessment Notes: [21:callus periwound.] [22:callus periwound.] [N/A:N/A] Procedures Performed: Debridement [22:Debridement] [N/A:N/A] Electronic Signature(s) Signed: 08/19/2019 5:41:03 PM By: Linton Ham MD Signed: 08/19/2019 6:05:23 PM By: Deon Pilling Entered By: Linton Ham on 08/19/2019 08:42:00 -------------------------------------------------------------------------------- Multi-Disciplinary Care Plan Details Patient Name: Date of Service: NANA, HOSELTON 08/19/2019 8:15 AM Medical Record Number:[id number] Patient Account Number: [id number] Date of Birth/Sex: Treating RN: 1940/12/26 (79 y.o. [name] Primary Care Andalyn Heckstall: [name] Other Clinician: Referring Allyah Heather: Treating Hudson Majkowski/Extender:Robson, Birdena Crandall, [name] in Treatment: 4 Active Inactive Abuse / Safety / Falls / Self Care Management Nursing Diagnoses: Potential for falls Goals: Patient/caregiver will verbalize/demonstrate measures taken to prevent injury and/or falls Date Initiated: 07/21/2019 Target Resolution Date: 09/17/2019 Goal Status: Active Interventions: Assess fall risk on admission and as needed Assess impairment of mobility on admission and as needed per policy Notes: Nutrition Nursing Diagnoses: Impaired glucose control: actual or potential Potential for alteratiion in Nutrition/Potential for imbalanced nutrition Goals: Patient/caregiver will maintain therapeutic glucose control Date Initiated: 07/21/2019 Target Resolution Date: 09/17/2019 Goal Status: Active Interventions: Assess HgA1c results as ordered upon admission and as needed Assess patient nutrition upon admission and as needed per policy Provide education on elevated blood  sugars and impact on wound healing Treatment Activities: Patient referred to Primary Care Physician for further nutritional evaluation : 07/21/2019 Notes: Wound/Skin Impairment Nursing Diagnoses: Impaired tissue integrity Knowledge deficit related to smoking impact on wound healing Knowledge deficit related to ulceration/compromised skin integrity Goals: Patient will demonstrate a reduced rate of smoking or cessation of smoking Date Initiated: 07/21/2019 Target Resolution Date: 09/17/2019 Goal Status: Active Patient/caregiver will verbalize understanding of skin care regimen Date Initiated: 07/21/2019 Date Inactivated: 07/29/2019 Target Resolution Date: 08/18/2019 Goal Status: Met Ulcer/skin breakdown will have a volume reduction of 30% by week 4 Date Initiated: 07/21/2019 Target Resolution Date: 09/17/2019 Goal Status: Active Interventions: Assess patient/caregiver ability to obtain necessary supplies Assess patient/caregiver ability to perform ulcer/skin care regimen upon admission and as needed Assess ulceration(s) every visit Provide education on smoking Provide education on ulcer and skin care Treatment Activities: Skin care regimen initiated : 07/21/2019 Topical wound management initiated : 07/21/2019 Notes: Electronic Signature(s) Signed: 08/19/2019 6:05:23 PM By: Deon Pilling Entered By: Deon Pilling on 08/19/2019 08:32:53 -------------------------------------------------------------------------------- Pain Assessment Details Patient Name: Date of Service: AMEN, STASZAK 08/19/2019 8:15 AM Medical Record [id number] Patient Account Number: [id number] Date of Birth/Sex: Treating RN: 02/20/1941 (79 y.o. [name] Primary Care Allaya Abbasi: [name] Other Clinician: Referring Recie Cirrincione: Treating Dartanyan Deasis/Extender:Robson, Birdena Crandall, [name] in Treatment: 4 Active Problems Location of Pain Severity and Description of Pain Patient Has Paino No Site  Locations Rate the pain. Current Pain Level: 0 Pain Management and Medication Current Pain Management: Medication: No Cold  Application: No Rest: No Massage: No Activity: No T.E.N.S.: No Heat Application: No Leg drop or elevation: No Is the Current Pain Management Adequate: Adequate How does your wound impact your activities of daily livingo Sleep: No Bathing: No Appetite: No Relationship With Others: No Bladder Continence: No Emotions: No Bowel Continence: No Work: No Toileting: No Drive: No Dressing: No Hobbies: No Electronic Signature(s) Signed: 08/19/2019 6:05:23 PM By: Deon Pilling Entered By: Deon Pilling on 08/19/2019 08:27:05 -------------------------------------------------------------------------------- Patient/Caregiver Education Details Patient Name: Date of Service: Hartshorne, Brit 3/4/2021andnbsp8:15 AM Medical Record [id number] Patient Account Number: [id number] Date of Birth/Gender: Treating RN: 03-Apr-1941 (79 y.o. [name] Primary Care Physician: [name] Other Clinician: Referring Physician: Treating Physician/Extender:Robson, Birdena Crandall, [name] in Treatment: 4 Education Assessment Education Provided To: Patient Education Topics Provided Elevated Blood Sugar/ Impact on Healing: Handouts: Elevated Blood Sugars: How Do They Affect Wound Healing Methods: Explain/Verbal Responses: Reinforcements needed Electronic Signature(s) Signed: 08/19/2019 6:05:23 PM By: Deon Pilling Entered By: Deon Pilling on 08/19/2019 08:33:10 -------------------------------------------------------------------------------- Wound Assessment Details Patient Name: Date of Service: ETHEN, BANNAN 08/19/2019 8:15 AM Medical Record Number:[id number] Patient Account Number: [id number] Date of Birth/Sex: Treating RN: 04/27/1941 (79 y.o. [name] Primary Care Itzae Mccurdy: [name] Other Clinician: Referring Daiya Tamer:  Treating Ashrith Sagan/Extender:Robson, Birdena Crandall, [name] in Treatment: 4 Wound Status Wound Number: [id number] Primary Diabetic Wound/Ulcer of the Lower Extremity Etiology: Wound Location: Left Foot - Plantar Wound Open Wounding Event: Gradually Appeared Status: Date Acquired: 06/17/2018 Comorbid Cataracts, Glaucoma, Hypertension, Weeks Of Treatment: 4 History: Peripheral Arterial Disease, Type II Diabetes, Clustered Wound: No Osteomyelitis, Neuropathy Photos Wound Measurements Length: (cm) 1.1 Width: (cm) 0.4 Depth: (cm) 0.4 Area: (cm) 0.346 Volume: (cm) 0.138 Wound Description Classification: Grade 2 Wound Margin: Thickened Exudate Amount: Medium Exudate Type: Serosanguineous Exudate Color: red, brown Wound Bed Granulation Amount: Large (67-100%) Granulation Quality: Red, Pink Necrotic Amount: None Present (0%) After Cleansing: No rino No Exposed Structure posed: No (Subcutaneous Tissue) Exposed: Yes posed: No posed: No osed: No sed: No % Reduction in Area: 85% % Reduction in Volume: 88.1% Epithelialization: None Tunneling: No Undermining: No Foul Odor Slough/Fib Fascia Ex Fat Layer Tendon Ex Muscle Ex Joint Exp Bone Expo Assessment Notes callus periwound. Electronic Signature(s) Signed: 08/30/2019 5:11:29 PM By: Mikeal Hawthorne EMT/HBOT Signed: 09/02/2019 4:55:35 PM By: Deon Pilling Previous Signature: 08/19/2019 6:05:23 PM Version By: Deon Pilling Entered By: Mikeal Hawthorne on 08/30/2019 15:12:28 -------------------------------------------------------------------------------- Wound Assessment Details Patient Name: Date of Service: AMAN, BONET 08/19/2019 8:15 AM Medical Record [id number] Patient Account Number: [id number] Date of Birth/Sex: Treating RN: 1941/04/17 (79 y.o. [name] Primary Care Erandi Lemma: [name] Other Clinician: Referring Darald Uzzle: Treating Avyukth Bontempo/Extender:Robson, Birdena Crandall,  [name] in Treatment: 4 Wound Status Wound Number: [id number] Primary Diabetic Wound/Ulcer of the Lower Extremity Etiology: Wound Location: Right Metatarsal head first Wound Open Wounding Event: Gradually Appeared Status: Date Acquired: 06/17/2018 Comorbid Cataracts, Glaucoma, Hypertension, Weeks Of Treatment: 4 History: Peripheral Arterial Disease, Type II Diabetes, Clustered Wound: No Osteomyelitis, Neuropathy Photos Wound Measurements Length: (cm) 0.8 Width: (cm) 0.9 Depth: (cm) 0.4 Area: (cm) 0.565 Volume: (cm) 0.226 Wound Description Classification: Grade 2 Wound Margin: Thickened Exudate Amount: Medium Exudate Type: Serosanguineous Exudate Color: red, brown Wound Bed Granulation Amount: Large (67-100%) Granulation Quality: Pink Necrotic Amount: Small (1-33%) Necrotic Quality: Adherent Slough Foul Odor After Cleansing: No Slough/Fibrino Yes Exposed Structure Fascia Exposed: No Fat Layer (Subcutaneous Tissue) Exposed: Ye Tendon Exposed: No Muscle Exposed: No Joint Exposed: No Bone Exposed: No % Reduction in  Area: 10% % Reduction in Volume: -20.2% Epithelialization: None Tunneling: No Undermining: No s Assessment Notes callus periwound. Electronic Signature(s) Signed: 08/30/2019 5:11:29 PM By: Mikeal Hawthorne EMT/HBOT Signed: 09/02/2019 4:55:35 PM By: Deon Pilling Previous Signature: 08/19/2019 6:05:23 PM Version By: Deon Pilling Entered By: Mikeal Hawthorne on 08/30/2019 15:08:18 -------------------------------------------------------------------------------- Vitals Details Patient Name: Date of Service: QUAMERE, MUSSELL 08/19/2019 8:15 AM Medical Record [id number] Patient Account Number: [id number] Date of Birth/Sex: Treating RN: 1941-05-26 (79 y.o. [name] Primary Care Daffney Greenly: [name] Other Clinician: Referring Virginie Josten: Treating Odaly Peri/Extender:Robson, Birdena Crandall, [name] in Treatment: 4 Vital  Signs Time Taken: 08:16 Temperature (F): 97.8 Height (in): 73 Pulse (bpm): 63 Weight (lbs): 180 Respiratory Rate (breaths/min): 18 Body Mass Index (BMI): 23.7 Blood Pressure (mmHg): 125/63 Capillary Blood Glucose (mg/dl): 98 Reference Range: 80 - 120 mg / dl Electronic Signature(s) Signed: 08/19/2019 6:05:23 PM By: Deon Pilling Entered By: Deon Pilling on 08/19/2019 08:26:56

## 2019-08-19 NOTE — Progress Notes (Signed)
Casey, [name] ([id number]) Visit Report for 08/19/2019 Debridement Details Patient Name: Date of Service: [name], [name] 08/19/2019 8:15 AM Medical Record Number:[id number] Patient Account Number: [id number] Date of Birth/Sex: Treating RN: 03-19-1941 (79 y.o. [name] Primary Care Provider: Leeroy [name] Other Clinician: Referring Provider: Treating Provider/Extender:[name], [name], [name] in Treatment: 4 Debridement Performed for Wound #21 Left,Plantar Foot Assessment: Performed By: Physician [name]., MD Debridement Type: Debridement Severity of Tissue Pre Fat layer exposed Debridement: Level of Consciousness (Pre- Awake and Alert procedure): Pre-procedure Verification/Time Out Taken: Yes - 08:32 Start Time: 08:33 Pain Control: Lidocaine 5% topical ointment Total Area Debrided (L x W): 1.1 (cm) x 0.4 (cm) = 0.44 (cm) Tissue and other material Viable, Non-Viable, Callus, Subcutaneous, Skin: Dermis , Fibrin/Exudate debrided: Level: Skin/Subcutaneous Tissue Debridement Description: Excisional Instrument: Curette Bleeding: Minimum Hemostasis Achieved: Pressure End Time: 08:37 Procedural Pain: 0 Post Procedural Pain: 0 Response to Treatment: Procedure was tolerated well Level of Consciousness Awake and Alert (Post-procedure): Post Debridement Measurements of Total Wound Length: (cm) 1.1 Width: (cm) 0.4 Depth: (cm) 0.4 Volume: (cm) 0.138 Character of Wound/Ulcer Post Improved Debridement: Severity of Tissue Post Debridement: Fat layer exposed Post Procedure Diagnosis Same as Pre-procedure Electronic Signature(s) Signed: 08/19/2019 5:41:03 PM By: Casey Ham MD Signed: 08/19/2019 6:05:23 PM By: [name] Entered By: [name] on 08/19/2019 08:42:08 -------------------------------------------------------------------------------- Debridement Details Patient Name: Date of Service: [name], [name] 08/19/2019 8:15  AM Medical Record [id number] Patient Account Number: [id number] Date of Birth/Sex: Treating RN: [birth year] (79 y.o. [name] Primary Care Provider: Leeroy [name] Other Clinician: Referring Provider: Treating Provider/Extender:[name], [name], [name] in Treatment: 4 Debridement Performed for Wound #22 Right Metatarsal head first Assessment: Performed By: Physician [name]., MD Debridement Type: Debridement Severity of Tissue Pre Fat layer exposed Debridement: Level of Consciousness (Pre- Awake and Alert procedure): Pre-procedure Yes - 08:32 Verification/Time Out Taken: Start Time: 08:33 Pain Control: Lidocaine 5% topical ointment Total Area Debrided (L x W): 0.8 (cm) x 0.9 (cm) = 0.72 (cm) Tissue and other material Viable, Non-Viable, Callus, Subcutaneous, Skin: Dermis , Fibrin/Exudate debrided: Level: Skin/Subcutaneous Tissue Debridement Description: Excisional Instrument: Curette Bleeding: Minimum Hemostasis Achieved: Pressure End Time: 08:37 Procedural Pain: 0 Post Procedural Pain: 0 Response to Treatment: Procedure was tolerated well Level of Consciousness Awake and Alert (Post-procedure): Post Debridement Measurements of Total Wound Length: (cm) 0.8 Width: (cm) 0.9 Depth: (cm) 0.4 Volume: (cm) 0.226 Character of Wound/Ulcer Post Improved Debridement: Severity of Tissue Post Debridement: Fat layer exposed Post Procedure Diagnosis Same as Pre-procedure Electronic Signature(s) Signed: 08/19/2019 5:41:03 PM By: Casey Ham MD Signed: 08/19/2019 6:05:23 PM By: [name] Entered By: [name] on 08/19/2019 08:42:16 -------------------------------------------------------------------------------- HPI Details Patient Name: Date of Service: [name], [name] 08/19/2019 8:15 AM Medical Record [id number] Patient Account Number: [id number] Date of Birth/Sex: Treating RN: 10-18-1940 (79 y.o. [name] Primary Care Provider: Leeroy [name] Other Clinician: Referring Provider: Treating Provider/Extender:[name], [name], [name] in Treatment: 4 History of Present Illness HPI Description: very pleasant 79 year old gentleman has type 2 diabetes and has been having an ulcer on his foot for several years. Been seeing as this time around since the end of October. Classified as a Wagner grade 2 because his previous x-rays did not show any problems. He has an MRI pending this Friday. discussing with him he says he has not been able to get his diabetic shoes yet. He continues to smoke his pipe and drinks moonshine and says he will not give this  up. I have tried to counsel him regarding this but he firmly says that he is happy to listen to me but he is not going to give up his habits. 08/17/14 -- patient has no fresh complaints but has recently come with his MRI which was done on 08/12/2014. 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 Readmission: 07/21/2019 upon evaluation today patient presents for initial inspection here in our clinic concerning issues that he has been having with his bilateral feet. He has open wounds that been present at least since the beginning of the year although he does not know an exact time. He is previously had amputations of all toes and the distal portion of his foot. 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 With that being said he does have a history of diabetes, peripheral vascular disease, nicotine dependence, and hypertension. He has not had any recent arterial or vascular studies he was noncompressible today I think he is going to require referral for arterial studies as well. 2/11; patient was readmitted to our clinic last week. He has bilateral plantar foot wounds in the setting of previous transmetatarsal  amputations. He had his arterial studies done on 2/8; these were actually quite good. He was noncompressible on both sides however his waveforms were triphasic. The thought was noncompressible vessels consistent with medial calcification but without significant stenosis in the lower extremities. X-rays were done on the left this showed extensive chronic and postoperative changes without evidence of osteomyelitis. There was no evidence of osteomyelitis on the right foot there was mild vascular calcification MRI was suggested on the left based on clinical correlation. The patient arrives in clinic with some odor and drainage from the left foot. Right foot measures smaller. He is really not offloading these areas although he says he does not walk much 2/25; the patient arrives with some odor and drainage from the left foot again. Post debridement I cultured this area. He has the area on the midfoot roughly the third metatarsal head and the first metatarsal head on the right. He is in surgical shoes bilateral transmet 3/4; culture from last week showed abundant group B strep and a few Proteus. We will put him on cephalexin starting today. Still silver alginate to the wounds Electronic Signature(s) Signed: 08/19/2019 5:41:03 PM By: Casey Ham MD Entered By: [name] on 08/19/2019 08:43:35 -------------------------------------------------------------------------------- Physical Exam Details Patient Name: Date of Service: [name], [name] 08/19/2019 8:15 AM Medical Record [id number] Patient Account Number: [id number] Date of Birth/Sex: Treating RN: January 29, 1941 (79 y.o. [name] Primary Care Provider: Leeroy [name] Other Clinician: Referring Provider: Treating Provider/Extender:Rylea Selway, [name], [name] in Treatment: 4 Constitutional Sitting or standing Blood Pressure is within target range for patient.. Pulse regular and within target range  for patient.Marland Kitchen Respirations regular, non-labored and within target range.. Temperature is normal and within the target range for the patient.Marland Kitchen Appears in no distress. Cardiovascular Pedal pulses are palpable. Notes Wound exam; both wound areas again have built up nonviable tissue around the edges and some surface debris over the wounds debrided with a #5 curette. These continue to clean up quite nicely this is on the left third and right first met heads. There is no palpable bone no overt soft tissue infection. Electronic Signature(s) Signed: 08/19/2019 5:41:03 PM By: Casey Ham MD Entered By: [name] on 08/19/2019 08:44:22 -------------------------------------------------------------------------------- Physician Orders Details Patient Name: Date of Service: [name],  [name] 08/19/2019 8:15 AM Medical Record Number:[id number] Patient Account Number: [id number] Date of Birth/Sex: Treating RN: 08/09/1940 (79 y.o. [name] Primary Care Provider: Leeroy [name] Other Clinician: Referring Provider: Treating Provider/Extender:Autumn Gunn, [name], [name] in Treatment: 4 Verbal / Phone Orders: No Diagnosis Coding ICD-10 Coding Code Description E11.621 Type 2 diabetes mellitus with foot ulcer L97.522 Non-pressure chronic ulcer of other part of left foot with fat layer exposed L97.512 Non-pressure chronic ulcer of other part of right foot with fat layer exposed I73.89 Other specified peripheral vascular diseases F17.218 Nicotine dependence, cigarettes, with other nicotine-induced disorders I10 Essential (primary) hypertension L84 Corns and callosities Follow-up Appointments Return Appointment in 1 week. Dressing Change Frequency Wound #21 Left,Plantar Foot Change dressing three times week. Wound #22 Right Metatarsal head first Change dressing three times week. Wound Cleansing Clean wound with Wound Cleanser May shower with protection. Primary  Wound Dressing Wound #21 Left,Plantar Foot Calcium Alginate with Silver Wound #22 Right Metatarsal head first Calcium Alginate with Silver Secondary Dressing Wound #21 Left,Plantar Foot Foam - foam donut to offload Kerlix/Rolled Gauze Dry Gauze ABD pad - as needed Wound #22 Right Metatarsal head first Foam - foam donut to offload Kerlix/Rolled Gauze Dry Gauze ABD pad - as needed Edema Control Avoid standing for long periods of time Elevate legs to the level of the heart or above for 30 minutes daily and/or when sitting, a frequency of: - throughout the day. Off-Loading Open toe surgical shoe to: - for bilateral feet. apply felt to both surgical shoes to aid in offloading. [place] skilled nursing for wound care. - [facility] Patient Medications Allergies: No Known Drug Allergies Notifications Medication Indication Start End cephalexin 08/19/2019 DOSE oral 500 mg capsule - 1 capsule oral q6h for 7 days Electronic Signature(s) Signed: 08/19/2019 8:47:01 AM By: Casey Ham MD Entered By: [name] on 08/19/2019 08:47:00 -------------------------------------------------------------------------------- Problem List Details Patient Name: Date of Service: [name], [name] 08/19/2019 8:15 AM Medical Record [id number] Patient Account Number: [id number] Date of Birth/Sex: Treating RN: 1941/01/05 (79 y.o. [name], Meta.Reding Primary Care Provider: Leeroy [name] Other Clinician: Referring Provider: Treating Provider/Extender:Ronnisha Felber, [name], [name] in Treatment: 4 Active Problems ICD-10 Evaluated Encounter Code Description Active Date Today Diagnosis E11.621 Type 2 diabetes mellitus with foot ulcer 07/21/2019 No Yes L97.522 Non-pressure chronic ulcer of other part of left foot 07/21/2019 No Yes with fat layer exposed L97.512 Non-pressure chronic ulcer of other part of right foot 07/21/2019 No Yes with fat layer exposed I73.89  Other specified peripheral vascular diseases 07/21/2019 No Yes F17.218 Nicotine dependence, cigarettes, with other nicotine- 07/21/2019 No Yes induced disorders I10 Essential (primary) hypertension 07/21/2019 No Yes L84 Corns and callosities 07/21/2019 No Yes Inactive Problems Resolved Problems Electronic Signature(s) Signed: 08/19/2019 5:41:03 PM By: Casey Ham MD Entered By: [name] on 08/19/2019 08:41:52 -------------------------------------------------------------------------------- Progress Note Details Patient Name: Date of Service: [name], [name] 08/19/2019 8:15 AM Medical Record [id number] Patient Account Number: [id number] Date of Birth/Sex: Treating RN: [birth year] (79 y.o. [name] Primary Care Provider: Leeroy [name] Other Clinician: Referring Provider: Treating Provider/Extender:Dashae Wilcher, [name], [name] in Treatment: 4 Subjective History of Present Illness (HPI) very pleasant 79 year old gentleman has type 2 diabetes and has been having an ulcer on his foot for several years. Been seeing as this time around since the end of October. Classified as a Wagner grade 2 because his previous x- rays did not show any problems. He has an MRI pending this Friday. discussing with him he  says he has not been able to get his diabetic shoes yet. He continues to smoke his pipe and drinks moonshine and says he will not give this up. I have tried to counsel him regarding this but he firmly says that he is happy to listen to me but he is not going to give up his habits. 08/17/14 -- patient has no fresh complaints but has recently come with his MRI which was done on 08/12/2014. 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 Readmission: 07/21/2019 upon evaluation today patient presents for initial inspection here in our clinic concerning issues that he  has been having with his bilateral feet. He has open wounds that been present at least since the beginning of the year although he does not know an exact time. He is previously had amputations of all toes and the distal portion of his foot. 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 With that being said he does have a history of diabetes, peripheral vascular disease, nicotine dependence, and hypertension. He has not had any recent arterial or vascular studies he was noncompressible today I think he is going to require referral for arterial studies as well. 2/11; patient was readmitted to our clinic last week. He has bilateral plantar foot wounds in the setting of previous transmetatarsal amputations. He had his arterial studies done on 2/8; these were actually quite good. He was noncompressible on both sides however his waveforms were triphasic. The thought was noncompressible vessels consistent with medial calcification but without significant stenosis in the lower extremities. X-rays were done on the left this showed extensive chronic and postoperative changes without evidence of osteomyelitis. There was no evidence of osteomyelitis on the right foot there was mild vascular calcification MRI was suggested on the left based on clinical correlation. The patient arrives in clinic with some odor and drainage from the left foot. Right foot measures smaller. He is really not offloading these areas although he says he does not walk much 2/25; the patient arrives with some odor and drainage from the left foot again. Post debridement I cultured this area. He has the area on the midfoot roughly the third metatarsal head and the first metatarsal head on the right. He is in surgical shoes bilateral transmet 3/4; culture from last week showed abundant group B strep and a few Proteus. We will put him on cephalexin starting today. Still silver alginate to the  wounds Objective Constitutional Sitting or standing Blood Pressure is within target range for patient.. Pulse regular and within target range for patient.Marland Kitchen Respirations regular, non-labored and within target range.. Temperature is normal and within the target range for the patient.Marland Kitchen Appears in no distress. Vitals Time Taken: 8:16 AM, Height: 73 in, Weight: 180 lbs, BMI: 23.7, Temperature: 97.8 F, Pulse: 63 bpm, Respiratory Rate: 18 breaths/min, Blood Pressure: 125/63 mmHg, Capillary Blood Glucose: 98 mg/dl. Cardiovascular Pedal pulses are palpable. General Notes: Wound exam; both wound areas again have built up nonviable tissue around the edges and some surface debris over the wounds debrided with a #5 curette. These continue to clean up quite nicely this is on the left third and right first met heads. There is no palpable bone no overt soft tissue infection. Integumentary (Hair, Skin) Wound #21 status is Open. Original cause of wound was Gradually Appeared. The wound is located on the [place]. The wound measures 1.1cm length x 0.4cm width x  0.4cm depth; 0.346cm^2 area and 0.138cm^3 volume. There is Fat Layer (Subcutaneous Tissue) Exposed exposed. There is no tunneling or undermining noted. There is a medium amount of serosanguineous drainage noted. The wound margin is thickened. There is large (67- 100%) red, pink granulation within the wound bed. There is no necrotic tissue within the wound bed. General Notes: callus periwound. Wound #22 status is Open. Original cause of wound was Gradually Appeared. The wound is located on the Right Metatarsal head first. The wound measures 0.8cm length x 0.9cm width x 0.4cm depth; 0.565cm^2 area and 0.226cm^3 volume. There is Fat Layer (Subcutaneous Tissue) Exposed exposed. There is no tunneling or undermining noted. There is a medium amount of serosanguineous drainage noted. The wound margin is thickened. There is large (67-100%) pink  granulation within the wound bed. There is a small (1-33%) amount of necrotic tissue within the wound bed including Adherent Slough. General Notes: callus periwound. Assessment Active Problems ICD-10 Type 2 diabetes mellitus with foot ulcer Non-pressure chronic ulcer of other part of left foot with fat layer exposed Non-pressure chronic ulcer of other part of right foot with fat layer exposed Other specified peripheral vascular diseases Nicotine dependence, cigarettes, with other nicotine-induced disorders Essential (primary) hypertension Corns and callosities Procedures Wound #21 Pre-procedure diagnosis of Wound #21 is a Diabetic Wound/Ulcer of the Lower Extremity located on the [place] .Severity of Tissue Pre Debridement is: Fat layer exposed. There was a Excisional Skin/Subcutaneous Tissue Debridement with a total area of 0.44 sq cm performed by [name]., MD. With the following instrument(s): Curette to remove Viable and Non-Viable tissue/material. Material removed includes Callus, Subcutaneous Tissue, Skin: Dermis, and Fibrin/Exudate after achieving pain control using Lidocaine 5% topical ointment. A time out was conducted at 08:32, prior to the start of the procedure. A Minimum amount of bleeding was controlled with Pressure. The procedure was tolerated well with a pain level of 0 throughout and a pain level of 0 following the procedure. Post Debridement Measurements: 1.1cm length x 0.4cm width x 0.4cm depth; 0.138cm^3 volume. Character of Wound/Ulcer Post Debridement is improved. Severity of Tissue Post Debridement is: Fat layer exposed. 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 There was a Excisional Skin/Subcutaneous Tissue Debridement with a total  area of 0.72 sq cm performed by [name]., MD. With the following instrument(s): Curette to remove Viable and Non-Viable tissue/material. Material removed includes Callus, Subcutaneous Tissue, Skin: Dermis, and Fibrin/Exudate after achieving pain control using Lidocaine 5% topical ointment. A time out was conducted at 08:32, prior to the start of the procedure. A Minimum amount of bleeding was controlled with Pressure. The procedure was tolerated well with a pain level of 0 throughout and a pain level of 0 following the procedure. Post Debridement Measurements: 0.8cm length x 0.9cm width x 0.4cm depth; 0.226cm^3 volume. Character of Wound/Ulcer Post Debridement is improved. Severity of Tissue Post Debridement is: Fat layer exposed. Post procedure Diagnosis Wound #22: Same as Pre-Procedure Plan Follow-up Appointments: Return Appointment in 1 week. Dressing Change Frequency: Wound #21 Left,Plantar Foot: Change dressing three times week. Wound #22 Right Metatarsal head first: Change dressing three times week. Wound Cleansing: Clean wound with Wound Cleanser May shower with protection. Primary Wound Dressing: Wound #21 Left,Plantar Foot: Calcium Alginate with Silver Wound #22 Right Metatarsal head first: Calcium  Alginate with Silver Secondary Dressing: Wound #21 Left,Plantar Foot: Foam - foam donut to offload Kerlix/Rolled Gauze Dry Gauze ABD pad - as needed Wound #22 Right Metatarsal head first: Foam - foam donut to offload Kerlix/Rolled Gauze Dry Gauze ABD pad - as needed Edema Control: Avoid standing for long periods of time Elevate legs to the level of the heart or above for 30 minutes daily and/or when sitting, a frequency of: - throughout the day. Off-Loading: Open toe surgical shoe to: - for bilateral feet. apply felt to both surgical shoes to aid in offloading. Home Health: [facility] skilled nursing for wound care. - [facility] The following  medication(s) was prescribed: cephalexin oral 500 mg capsule 1 capsule oral q6h for 7 days starting 08/19/2019 1. Continue with silver alginate to both wound areas Cephalexin 500 every 6 x7 days 3. The best we can do to offload these is in surgical sandals with felt I do not know that this is not adequate but there are not a lot of options Electronic Signature(s) Signed: 08/19/2019 8:47:24 AM By: Casey Ham MD Entered By: [name] on 08/19/2019 08:47:24 -------------------------------------------------------------------------------- SuperBill Details Patient Name: Date of Service: [name], [name] 08/19/2019 Medical Record Number:[id number] Patient Account Number: [id number] Date of Birth/Sex: Treating RN: 1941-03-11 (79 y.o. [name] Primary Care Provider: Leeroy [name] Other Clinician: Referring Provider: Treating Provider/Extender:Llewellyn Schoenberger, [name], [name] in Treatment: 4 Diagnosis Coding ICD-10 Codes Code Description E11.621 Type 2 diabetes mellitus with foot ulcer L97.522 Non-pressure chronic ulcer of other part of left foot with fat layer exposed L97.512 Non-pressure chronic ulcer of other part of right foot with fat layer exposed I73.89 Other specified peripheral vascular diseases F17.218 Nicotine dependence, cigarettes, with other nicotine-induced disorders I10 Essential (primary) hypertension L84 Corns and callosities Facility Procedures Physician Procedures CPT4 Code Description: 4492010 [id number] - WC PHYS SUBQ TISS 20 SQ CM ICD-10 Diagnosis Description L97.522 Non-pressure chronic ulcer of other part of left foot w L97.512 Non-pressure chronic ulcer of other part of right foot E11.621 Type 2 diabetes mellitus  with foot ulcer Modifier: ith fat layer ex with fat layer e Quantity: 1 posed xposed Electronic Signature(s) Signed: 08/19/2019 5:41:03 PM By: Casey Ham MD Signed: 08/19/2019 6:05:23 PM By: [name] Entered By: [name] on 08/19/2019 08:40:35

## 2019-08-26 ENCOUNTER — Encounter (HOSPITAL_BASED_OUTPATIENT_CLINIC_OR_DEPARTMENT_OTHER): Payer: Medicare Other | Admitting: Internal Medicine

## 2019-08-26 ENCOUNTER — Other Ambulatory Visit: Payer: Self-pay

## 2019-08-26 DIAGNOSIS — E11621 Type 2 diabetes mellitus with foot ulcer: Secondary | ICD-10-CM | POA: Diagnosis not present

## 2019-08-28 NOTE — Progress Notes (Signed)
Casey, [name] ([id number]) Visit Report for 08/26/2019 Arrival Information Details Patient Name: Date of Service: [name], [name] 08/26/2019 8:15 AM Medical Record Number:[id number] Patient Account Number: [id number] Date of Birth/Sex: Treating RN: 1941/01/05 (79 y.o. [name] Primary Care Landa Mullinax: [name] Other Clinician: Referring Nadirah Socorro: Treating Nashly Olsson/Extender:Robson, Birdena Crandall, [name] in Treatment: 5 Visit Information History Since Last Visit Added or deleted any medications: No Patient Arrived: [name] Any new allergies or adverse reactions: No Arrival Time: 08:50 Had a fall or experienced change in No Accompanied By: self activities of daily living that may affect Transfer Assistance: None risk of falls: Patient Identification Verified: Yes Signs or symptoms of abuse/neglect since last No Secondary Verification Process Yes visito Completed: Hospitalized since last visit: No Patient Requires Transmission-Based No Implantable device outside of the clinic excluding No Precautions: cellular tissue based products placed in the center Patient Has Alerts: Yes since last visit: Patient Alerts: R ABI non Has Dressing in Place as Prescribed: Yes compressible Pain Present Now: No L ABI non compressible Electronic Signature(s) Signed: 08/26/2019 6:03:12 PM By: Kela Millin Entered By: Kela Millin on 08/26/2019 08:50:54 -------------------------------------------------------------------------------- Encounter Discharge Information Details Patient Name: Date of Service: [name], [name] 08/26/2019 8:15 AM Medical Record [id number] Patient Account Number: [id number] Date of Birth/Sex: Treating RN: [birth year] (79 y.o. [name] Primary Care Azlynn Mitnick: [name] Other Clinician: Referring Elizah Lydon: Treating Travante Knee/Extender:Robson, Birdena Crandall, [name] in Treatment: 5 Encounter  Discharge Information Items Post Procedure Vitals Discharge Condition: Stable Temperature (F): 97.7 Ambulatory Status: Cane Pulse (bpm): 55 Discharge Destination: Home Respiratory Rate (breaths/min): 18 Transportation: Private Auto Blood Pressure (mmHg): 109/51 Accompanied By: self Schedule Follow-up Appointment: Yes Clinical Summary of Care: Patient Declined Electronic Signature(s) Signed: 08/26/2019 5:58:47 PM By: Baruch Gouty RN, BSN Entered By: Baruch Gouty on 08/26/2019 09:44:19 -------------------------------------------------------------------------------- Lower Extremity Assessment Details Patient Name: Date of Service: [name], [name] 08/26/2019 8:15 AM Medical Record Number:[id number] Patient Account Number: [id number] Date of Birth/Sex: Treating RN: Jun 02, 1941 (79 y.o. [name] Primary Care Carnesha Maravilla: [name] Other Clinician: Referring Annastasia Haskins: Treating Calise Dunckel/Extender:Robson, Birdena Crandall, [name] in Treatment: 5 Edema Assessment Assessed: [Left: No] [Right: No] Edema: [Left: Yes] [Right: No] Calf Left: Right: Point of Measurement: 32 cm From Medial Instep 28.5 cm 35 cm Ankle Left: Right: Point of Measurement: 10 cm From Medial Instep 23.4 cm 25.5 cm Vascular Assessment Pulses: Dorsalis Pedis Palpable: [Left:Yes] [Right:Yes] Electronic Signature(s) Signed: 08/26/2019 6:03:12 PM By: Kela Millin Entered By: Kela Millin on 08/26/2019 08:52:22 -------------------------------------------------------------------------------- Multi Wound Chart Details Patient Name: Date of Service: [name], [name] 08/26/2019 8:15 AM Medical Record [id number] Patient Account Number: [id number] Date of Birth/Sex: Treating RN: 08-12-1940 (79 y.o. 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] [22:Lower Extremity] Comorbid History: [21:Cataracts, Glaucoma, Hypertension, Peripheral Hypertension, Peripheral Arterial Disease, Type II Diabetes, Osteomyelitis, Diabetes, Osteomyelitis, Neuropathy] [22:Cataracts, Glaucoma, Arterial Disease, Type II Neuropathy] [N/A:N/A] Date Acquired: [21:06/17/2018] [22:06/17/2018] [N/A:N/A] Weeks of Treatment: [21:5] [22:5] [N/A:N/A] Wound Status: [21:Open] [22:Open] [N/A:N/A] Measurements L x W x D 0.9x0.3x0.7 [22:0.8x1.2x0.6] [N/A:N/A] (cm) Area (cm) : [21:0.212] [34:1.937] [N/A:N/A] Volume (cm) : [90:2.409] [22:0.452] [N/A:N/A] %  Reduction in Area: [21:90.80%] [22:-20.10%] [N/A:N/A] % Reduction in Volume: 87.20% [22:-140.40%] [N/A:N/A] Classification: [21:Grade 2] [22:Grade 2] [N/A:N/A] Exudate Amount: [21:Medium] [22:Medium] [N/A:N/A] Exudate Type: [21:Serosanguineous] [22:Serosanguineous] [N/A:N/A] Exudate Color: [21:red, brown] [22:red, brown] [N/A:N/A] Wound Margin: [21:Thickened] [22:Thickened] [N/A:N/A] Granulation Amount: [21:Large (67-100%)] [22:Large (67-100%)] [N/A:N/A] Granulation Quality: [21:Red, Pink] [22:Red, Pink] [N/A:N/A] Necrotic Amount: [21:None Present (0%)] [22:None Present (0%)] [N/A:N/A] Exposed Structures: [21:Fat Layer (Subcutaneous Fat Layer (Subcutaneous N/A Tissue) Exposed: Yes Fascia: No Tendon: No Muscle: No Joint: No Bone: No] [22:Tissue) Exposed: Yes Fascia: No Tendon: No Muscle: No Joint: No Bone: No] Epithelialization: [21:None]  [22:None] [N/A:N/A] Debridement: [21:Debridement - Excisional Debridement - Excisional N/A] Pre-procedure [95:18:84] [22:09:17] [N/A:N/A] Verification/Time Out Taken: Pain Control: [21:Lidocaine 4% Topical Solution] [22:Lidocaine 4% Topical Solution] [N/A:N/A] Tissue Debrided: [21:Callus, Subcutaneous] [22:Callus, Subcutaneous] [N/A:N/A] Level: [21:Skin/Subcutaneous Tissue Skin/Subcutaneous Tissue N/A] Debridement Area (sq cm):0.5 [22:1.3] [N/A:N/A] Instrument: [21:Curette] [22:Curette] [N/A:N/A] Bleeding: [21:Minimum] [22:Minimum] [N/A:N/A] Hemostasis Achieved: [21:Pressure] [22:Pressure] [N/A:N/A] Procedural Pain: [21:0] [22:0] [N/A:N/A] Post Procedural Pain: [21:0] [22:0] [N/A:N/A] Debridement Treatment [21:Procedure was tolerated] [22:Procedure was tolerated] [N/A:N/A] Response: [21:well] [22:well] Post Debridement [21:0.9x0.3x0.7] [22:0.8x1.2x0.6] [N/A:N/A] Measurements L x W x D (cm) Post Debridement [21:0.148] [16:6.063] [N/A:N/A] Volume: (cm) Procedures Performed: [21:Debridement] [22:Debridement] [N/A:N/A] Treatment Notes Electronic Signature(s) Signed: 08/26/2019 6:00:33 PM By: Deon Pilling Signed: 08/28/2019 7:03:15 AM By: Linton Ham MD Entered By: Linton Ham on 08/26/2019 09:40:13 -------------------------------------------------------------------------------- Multi-Disciplinary Care Plan Details Patient Name: Date of Service: [name], [name] 08/26/2019 8:15 AM Medical Record Number:[id number] Patient Account Number: [id number] Date of Birth/Sex: Treating RN: [birth year] (79 y.o. [name] Primary Care Azile Minardi: [name] Other Clinician: Referring Maronda Caison: Treating Lamara Brecht/Extender:Robson, Birdena Crandall, [name] in Treatment: 5 Active Inactive Abuse / Safety / Falls / Self Care Management Nursing Diagnoses: Potential for falls Goals: Patient/caregiver will verbalize/demonstrate measures taken to prevent injury and/or  falls Date Initiated: 07/21/2019 Target Resolution Date: 09/17/2019 Goal Status: Active Interventions: Assess fall risk on admission and as needed Assess impairment of mobility on admission and as needed per policy Notes: Nutrition Nursing Diagnoses: Impaired glucose control: actual or potential Potential for alteratiion in Nutrition/Potential for imbalanced nutrition Goals: Patient/caregiver will maintain therapeutic glucose control Date Initiated: 07/21/2019 Target Resolution Date: 09/17/2019 Goal Status: Active Interventions: Assess HgA1c results as ordered upon admission and as needed Assess patient nutrition upon admission and as needed per policy Provide education on elevated blood sugars and impact on wound healing Treatment Activities: Patient referred to Primary Care Physician for further nutritional evaluation : 07/21/2019 Notes: Wound/Skin Impairment Nursing Diagnoses: Impaired tissue integrity Knowledge deficit related to smoking impact on wound healing Knowledge deficit related to ulceration/compromised skin integrity Goals: Patient will demonstrate a reduced rate of smoking or cessation of smoking Date Initiated: 07/21/2019 Target Resolution Date: 09/17/2019 Goal Status: Active Patient/caregiver will verbalize understanding of skin care regimen Date Initiated: 07/21/2019 Date Inactivated: 07/29/2019 Target Resolution Date: 08/18/2019 Goal Status: Met Ulcer/skin breakdown will have a volume reduction of 30% by week 4 Date Initiated: 07/21/2019 Target Resolution Date: 09/17/2019 Goal Status: Active Interventions: Assess patient/caregiver ability to obtain necessary supplies Assess patient/caregiver ability to perform ulcer/skin care regimen upon admission and as needed Assess ulceration(s) every visit Provide education on smoking Provide education on ulcer and skin care Treatment Activities: Skin care regimen initiated : 07/21/2019 Topical wound management initiated :  07/21/2019 Notes: Electronic Signature(s) Signed: 08/26/2019 6:00:33 PM By: Deon Pilling Entered By: Deon Pilling on 08/26/2019 08:24:56 -------------------------------------------------------------------------------- Pain Assessment Details Patient Name: Date of Service: [name], [name] 08/26/2019 8:15 AM Medical Record [id number]  Patient Account Number: [id number] Date of Birth/Sex: Treating RN: 11-24-1940 (79 y.o. [name] Primary Care Geetika Laborde: [name] Other Clinician: Referring Adir Schicker: Treating Davisha Linthicum/Extender:Robson, Birdena Crandall, [name] in Treatment: 5 Active Problems Location of Pain Severity and Description of Pain Patient Has Paino No Site Locations Pain Management and Medication Current Pain Management: Electronic Signature(s) Signed: 08/26/2019 6:03:12 PM By: Kela Millin Entered By: Kela Millin on 08/26/2019 08:51:35 -------------------------------------------------------------------------------- Patient/Caregiver Education Details Patient Name: Date of Service: [name] Applebaum 3/11/2021andnbsp8:15 AM Medical Record [id number] Patient Account Number: [id number] Date of Birth/Gender: Treating RN: [birth year] (79 y.o. [name] Primary Care Physician: [name] Other Clinician: Referring Physician: Treating Physician/Extender:Robson, Birdena Crandall, [name] in Treatment: 5 Education Assessment Education Provided To: Patient Education Topics Provided Smoking and Wound Healing: Handouts: Other: verbal communication Methods: Explain/Verbal Responses: Reinforcements needed Electronic Signature(s) Signed: 08/26/2019 6:00:33 PM By: Deon Pilling Entered By: Deon Pilling on 08/26/2019 08:25:12 -------------------------------------------------------------------------------- Wound Assessment Details Patient Name: Date of Service: BRAYAN, [name] 08/26/2019 8:15  AM Medical Record [id number] Patient Account Number: [id number] Date of Birth/Sex: Treating RN: [birth year] (78 y.o. [name] Primary Care Jashan Cotten: [name] Other Clinician: Referring Landrum Carbonell: Treating Azalia Neuberger/Extender:Robson, Birdena Crandall, [name] in Treatment: 5 Wound Status Wound Number: 21 Primary Diabetic Wound/Ulcer of the Lower Extremity Etiology: Wound Location: Left Foot - Plantar Wound Open Wounding Event: Gradually Appeared Status: Date Acquired: 06/17/2018 Comorbid Cataracts, Glaucoma, Hypertension, Weeks Of Treatment: 5 History: Peripheral Arterial Disease, Type II Diabetes, Clustered Wound: No Osteomyelitis, Neuropathy Photos Wound Measurements Length: (cm) 0.9 Width: (cm) 0.3 Depth: (cm) 0.7 Area: (cm) 0.212 Volume: (cm) 0.148 Wound Description Classification: Grade 2 Wound Margin: Thickened Exudate Amount: Medium Exudate Type: Serosanguineous Exudate Color: red, brown Wound Bed Granulation Amount: Large (67-100%) Granulation Quality: Red, Pink Necrotic Amount: None Present (0%) After Cleansing: No rino No Exposed Structure posed: No (Subcutaneous Tissue) Exposed: Yes posed: No posed: No osed: No sed: No % Reduction in Area: 90.8% % Reduction in Volume: 87.2% Epithelialization: None Tunneling: No Undermining: No Foul Odor Slough/Fib Fascia Ex Fat Layer Tendon Ex Muscle Ex Joint Exp Bone Expo Treatment Notes Wound #21 (Left, Plantar Foot) 2. Periwound Care Moisturizing lotion 3. Primary Dressing Applied Collegen AG 4. Secondary Dressing Dry Gauze Roll Gauze Foam 7. Footwear/Offloading device applied Surgical shoe Electronic Signature(s) Signed: 08/27/2019 4:47:30 PM By: Mikeal Hawthorne EMT/HBOT Signed: 08/27/2019 5:45:00 PM By: Deon Pilling Previous Signature: 08/26/2019 6:03:12 PM Version By: Kela Millin Entered By: Mikeal Hawthorne on 08/27/2019  15:50:46 -------------------------------------------------------------------------------- Wound Assessment Details Patient Name: Date of Service: [name], [name] 08/26/2019 8:15 AM Medical Record [id number] Patient Account Number: [id number] Date of Birth/Sex: Treating RN: 21-Jul-1940 (79 y.o. [name] Primary Care Shaune Westfall: [name] Other Clinician: Referring Kelwin Gibler: Treating Torian Quintero/Extender:Robson, Birdena Crandall, [name] in Treatment: 5 Wound Status Wound Number: 22 Primary Diabetic Wound/Ulcer of the Lower Extremity Etiology: Wound Location: Right Metatarsal head first Wound Open Wounding Event: Gradually Appeared Status: Date Acquired: 06/17/2018 Comorbid Cataracts, Glaucoma, Hypertension, Weeks Of Treatment: 5 History: Peripheral Arterial Disease, Type II Diabetes, Clustered Wound: No Osteomyelitis, Neuropathy Photos Wound Measurements Length: (cm) 0.8 % Reductio Width: (cm) 1.2 % Reductio Depth: (cm) 0.6 Epithelial Area: (cm) 0.754 Tunneling Volume: (cm) 0.452 Undermini Wound Description Classification: Grade 2 Wound Margin: Thickened Exudate Amount: Medium Exudate Type: Serosanguineous Exudate Color: red, brown Wound Bed Granulation Amount: Large (67-100%) Granulation Quality: Red, Pink Necrotic Amount: None Present (0%) Foul Odor After Cleansing: No Slough/Fibrino No Exposed Structure Fascia Exposed: No Fat Layer (Subcutaneous  Tissue) Exposed: Yes Tendon Exposed: No Muscle Exposed: No Joint Exposed: No Bone Exposed: No n in Area: -20.1% n in Volume: -140.4% ization: None : No ng: No Treatment Notes Wound #22 (Right Metatarsal head first) 2. Periwound Care Moisturizing lotion 3. Primary Dressing Applied Collegen AG 4. Secondary Dressing Dry Gauze Roll Gauze Foam 7. Footwear/Offloading device applied Surgical shoe Electronic Signature(s) Signed: 08/27/2019 4:47:30 PM By: Mikeal Hawthorne  EMT/HBOT Signed: 08/27/2019 5:45:00 PM By: Deon Pilling Previous Signature: 08/26/2019 6:03:12 PM Version By: Kela Millin Entered By: Mikeal Hawthorne on 08/27/2019 15:50:26 -------------------------------------------------------------------------------- Vitals Details Patient Name: Date of Service: [name], [name] 08/26/2019 8:15 AM Medical Record [id number] Patient Account Number: [id number] Date of Birth/Sex: Treating RN: 10-04-1940 (79 y.o. [name] Primary Care Lambros Cerro: [name] Other Clinician: Referring Louetta Hollingshead: Treating Erdem Naas/Extender:Robson, Birdena Crandall, [name] in Treatment: 5 Vital Signs Time Taken: 08:50 Temperature (F): 97.7 Height (in): 73 Pulse (bpm): 55 Weight (lbs): 180 Respiratory Rate (breaths/min): 18 Body Mass Index (BMI): 23.7 Blood Pressure (mmHg): 109/51 Reference Range: 80 - 120 mg / dl Notes patient stated he did not check his CBG this morning Electronic Signature(s) Signed: 08/26/2019 6:03:12 PM By: Kela Millin Entered By: Kela Millin on 08/26/2019 08:51:30

## 2019-08-28 NOTE — Progress Notes (Signed)
CAIUS, CONNELLEY ([id number]) Visit Report for 08/26/2019 Debridement Details Patient Name: Date of Service: [name], [name] 08/26/2019 8:15 AM Medical Record Number:[id number] Patient Account Number: [id number] Date of Birth/Sex: Treating RN: 1941-05-23 (79 y.o. [name] Primary Care Provider: Leeroy [name] Other Clinician: Referring Provider: Leeroy [name] Treating Provider/Extender:[name], [name] in Treatment: 5 Debridement Performed for Wound #21 Left,Plantar Foot Assessment: Performed By: Physician [name]., MD Debridement Type: Debridement Severity of Tissue Pre Fat layer exposed Debridement: Level of Consciousness (Pre- Awake and Alert procedure): Pre-procedure Verification/Time Out Taken: Yes - 09:17 Start Time: 09:18 Pain Control: Lidocaine 4% Topical Solution Total Area Debrided (L x W): 1 (cm) x 0.5 (cm) = 0.5 (cm) Tissue and other material Non-Viable, Callus, Subcutaneous, Skin: Dermis debrided: Level: Skin/Subcutaneous Tissue Debridement Description: Excisional Instrument: Curette Bleeding: Minimum Hemostasis Achieved: Pressure End Time: 09:20 Procedural Pain: 0 Post Procedural Pain: 0 Response to Treatment: Procedure was tolerated well Level of Consciousness Awake and Alert (Post-procedure): Post Debridement Measurements of Total Wound Length: (cm) 0.9 Width: (cm) 0.3 Depth: (cm) 0.7 Volume: (cm) 0.148 Character of Wound/Ulcer Post Improved Debridement: Severity of Tissue Post Debridement: Fat layer exposed Post Procedure Diagnosis Same as Pre-procedure Electronic Signature(s) Signed: 08/26/2019 6:00:33 PM By: [name] Signed: 08/28/2019 7:03:15 AM By: Casey Ham MD Entered By: [name] on 08/26/2019 09:41:16 -------------------------------------------------------------------------------- Debridement Details Patient Name: Date of Service: [name], [name] 08/26/2019 8:15 AM Medical Record  [id number] Patient Account Number: [id number] Date of Birth/Sex: Treating RN: 11-23-1940 (79 y.o. [name] Primary Care Provider: Leeroy [name] Other Clinician: Referring Provider: Leeroy [name] Treating Provider/Extender:[name], [name] in Treatment: 5 Debridement Performed for Wound #22 Right Metatarsal head first Assessment: Performed By: Physician [name]., MD Debridement Type: Debridement Severity of Tissue Pre Fat layer exposed Debridement: Level of Consciousness (Pre- Awake and Alert procedure): Pre-procedure Yes - 09:17 Verification/Time Out Taken: Start Time: 09:18 Pain Control: Lidocaine 4% Topical Solution Total Area Debrided (L x W): 1 (cm) x 1.3 (cm) = 1.3 (cm) Tissue and other material Non-Viable, Callus, Subcutaneous, Skin: Dermis debrided: Level: Skin/Subcutaneous Tissue Debridement Description: Excisional Instrument: Curette Bleeding: Minimum Hemostasis Achieved: Pressure End Time: 09:20 Procedural Pain: 0 Post Procedural Pain: 0 Response to Treatment: Procedure was tolerated well Level of Consciousness Awake and Alert (Post-procedure): Post Debridement Measurements of Total Wound Length: (cm) 0.8 Width: (cm) 1.2 Depth: (cm) 0.6 Volume: (cm) 0.452 Character of Wound/Ulcer Post Improved Debridement: Severity of Tissue Post Debridement: Fat layer exposed Post Procedure Diagnosis Same as Pre-procedure Electronic Signature(s) Signed: 08/26/2019 6:00:33 PM By: [name] Signed: 08/28/2019 7:03:15 AM By: Casey Ham MD Entered By: [name] on 08/26/2019 09:41:29 -------------------------------------------------------------------------------- HPI Details Patient Name: Date of Service: [name], [name] 08/26/2019 8:15 AM Medical Record [id number] Patient Account Number: [id number] Date of Birth/Sex: Treating RN: 1940-07-07 (79 y.o. [name] Primary Care Provider: Leeroy [name] Other Clinician: Referring Provider: Treating Provider/Extender:[name], [name], Casey Samples in Treatment: 5 History of Present Illness HPI Description: very pleasant 79 year old gentleman has type 2 diabetes and has been having an ulcer on his foot for several years. Been seeing as this time around since the end of October. Classified as a Wagner grade 2 because his previous x-rays did not show any problems. He has an MRI pending this Friday. discussing with him he says he has not been able to get his diabetic shoes yet. He continues to smoke his pipe and drinks moonshine and says he will not give this up. I have tried to counsel  him regarding this but he firmly says that he is happy to listen to me but he is not going to give up his habits. 08/17/14 -- patient has no fresh complaints but has recently come with his MRI which was done on 08/12/2014. 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 Readmission: 07/21/2019 upon evaluation today patient presents for initial inspection here in our clinic concerning issues that he has been having with his bilateral feet. He has open wounds that been present at least since the beginning of the year although he does not know an exact time. He is previously had amputations of all toes and the distal portion of his foot. 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 With that being said he does have a history of diabetes, peripheral vascular disease, nicotine dependence, and hypertension. He has not had any recent arterial or vascular studies he was noncompressible today I think he is going to require referral for arterial studies as well. 2/11; patient was readmitted to our clinic last week. He has bilateral plantar foot wounds in the setting of previous transmetatarsal amputations. He had his arterial studies  done on 2/8; these were actually quite good. He was noncompressible on both sides however his waveforms were triphasic. The thought was noncompressible vessels consistent with medial calcification but without significant stenosis in the lower extremities. X-rays were done on the left this showed extensive chronic and postoperative changes without evidence of osteomyelitis. There was no evidence of osteomyelitis on the right foot there was mild vascular calcification MRI was suggested on the left based on clinical correlation. The patient arrives in clinic with some odor and drainage from the left foot. Right foot measures smaller. He is really not offloading these areas although he says he does not walk much 2/25; the patient arrives with some odor and drainage from the left foot again. Post debridement I cultured this area. He has the area on the midfoot roughly the third metatarsal head and the first metatarsal head on the right. He is in surgical shoes bilateral transmet 3/4; culture from last week showed abundant group B strep and a few Proteus. We will put him on cephalexin starting today. Still silver alginate to the wounds 3/11; due to issues with pharmacy he still does not have the cephalexin that I prescribed a week ago. Apparently this was called into a different pharmacy on Friday but they have not delivered it to him. We have been using silver alginate on the wounds but because of lack of moisture change to silver collagen today. We are offloading as best we can in surgical shoes Electronic Signature(s) Signed: 08/28/2019 7:03:15 AM By: Casey Ham MD Entered By: [name] on 08/26/2019 09:44:31 -------------------------------------------------------------------------------- Physical Exam Details Patient Name: Date of Service: [name], [name] 08/26/2019 8:15 AM Medical Record [id number] Patient Account Number: [id number] Date of Birth/Sex: Treating RN: 06/07/1941 (79  y.o. [name] Primary Care Provider: Leeroy [name] Other Clinician: Referring Provider: Treating Provider/Extender:Adaria Hole, [name], Casey Samples in Treatment: 5 Constitutional Sitting or standing Blood Pressure is within target range for patient.. Pulse regular and within target range for patient.Marland Kitchen Respirations regular, non-labored and within target range.. Temperature is normal and within the target range for the patient.Marland Kitchen Appears in no distress. Cardiovascular Pedal pulses are palpable. Notes Wound exam; both wound areas again have built up callus subcutaneous tissue around the wound surface. The left plantar wound  is dry and has more depth. No clear evidence of infection nothing needed culturing. Electronic Signature(s) Signed: 08/28/2019 7:03:15 AM By: Casey Ham MD Entered By: [name] on 08/26/2019 09:45:28 -------------------------------------------------------------------------------- Physician Orders Details Patient Name: Date of Service: [name], [name] 08/26/2019 8:15 AM Medical Record [id number] Patient Account Number: [id number] Date of Birth/Sex: Treating RN: [birth year] (79 y.o. [name] Primary Care Provider: Leeroy [name] Other Clinician: Referring Provider: Treating Provider/Extender:Ermel Verne, [name], Casey Samples in Treatment: 5 Verbal / Phone Orders: No Diagnosis Coding ICD-10 Coding Code Description E11.621 Type 2 diabetes mellitus with foot ulcer L97.522 Non-pressure chronic ulcer of other part of left foot with fat layer exposed L97.512 Non-pressure chronic ulcer of other part of right foot with fat layer exposed I73.89 Other specified peripheral vascular diseases F17.218 Nicotine dependence, cigarettes, with other nicotine-induced disorders I10 Essential (primary) hypertension L84 Corns and callosities Follow-up Appointments Return Appointment in 1 week. Dressing Change  Frequency Wound #21 Left,Plantar Foot Change dressing three times week. Wound #22 Right Metatarsal head first Change dressing three times week. Wound Cleansing Clean wound with Wound Cleanser May shower with protection. Primary Wound Dressing Wound #21 Left,Plantar Foot Silver Collagen - with hydrogel Wound #22 Right Metatarsal head first Silver Collagen - with hydrogel Secondary Dressing Wound #21 Left,Plantar Foot Foam - foam donut to offload Kerlix/Rolled Gauze Dry Gauze ABD pad - as needed Wound #22 Right Metatarsal head first Foam - foam donut to offload Kerlix/Rolled Gauze Dry Gauze ABD pad - as needed Edema Control Avoid standing for long periods of time Elevate legs to the level of the heart or above for 30 minutes daily and/or when sitting, a frequency of: - throughout the day. Off-Loading Open toe surgical shoe to: - for bilateral feet. apply felt to both surgical shoes to aid in offloading. [place] skilled nursing for wound care. - [facility]) Signed: 08/26/2019 6:00:33 PM By: [name] Signed: 08/28/2019 7:03:15 AM By: Casey Ham MD Entered By: [name] on 08/26/2019 09:22:32 -------------------------------------------------------------------------------- Problem List Details Patient Name: Date of Service: [name], [name] 08/26/2019 8:15 AM Medical Record [id number] Patient Account Number: [id number] Date of Birth/Sex: Treating RN: 24-May-1941 (79 y.o. [name], Meta.Reding Primary Care Provider: Leeroy [name] Other Clinician: Referring Provider: Treating Provider/Extender:Lilla Callejo, [name], Casey Samples in Treatment: 5 Active Problems ICD-10 Evaluated Encounter Code Description Active Date Today Diagnosis E11.621 Type 2 diabetes mellitus with foot ulcer 07/21/2019 No Yes L97.522 Non-pressure chronic ulcer of other part of left foot 07/21/2019 No Yes with fat layer  exposed L97.512 Non-pressure chronic ulcer of other part of right foot 07/21/2019 No Yes with fat layer exposed I73.89 Other specified peripheral vascular diseases 07/21/2019 No Yes F17.218 Nicotine dependence, cigarettes, with other nicotine- 07/21/2019 No Yes induced disorders I10 Essential (primary) hypertension 07/21/2019 No Yes L84 Corns and callosities 07/21/2019 No Yes Inactive Problems Resolved Problems Electronic Signature(s) Signed: 08/28/2019 7:03:15 AM By: Casey Ham MD Entered By: [name] on 08/26/2019 09:40:04 -------------------------------------------------------------------------------- Progress Note Details Patient Name: Date of Service: [name], [name] 08/26/2019 8:15 AM Medical Record [id number] Patient Account Number: [id number] Date of Birth/Sex: Treating RN: May 08, 1941 (79 y.o. [name] Primary Care Provider: Leeroy [name] Other Clinician: Referring Provider: Treating Provider/Extender:Cherly Erno, [name], Casey Samples in Treatment: 5 Subjective History of Present Illness (HPI) very pleasant 79 year old gentleman has type 2 diabetes and has been having an ulcer on his foot for several years. Been seeing as this time around since the end of October. Classified as a Earleen Newport  grade 2 because his previous x- rays did not show any problems. He has an MRI pending this Friday. discussing with him he says he has not been able to get his diabetic shoes yet. He continues to smoke his pipe and drinks moonshine and says he will not give this up. I have tried to counsel him regarding this but he firmly says that he is happy to listen to me but he is not going to give up his habits. 08/17/14 -- patient has no fresh complaints but has recently come with his MRI which was done on 08/12/2014. 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 Readmission: 07/21/2019 upon evaluation today patient presents for initial inspection here in our clinic concerning issues that he has been having with his bilateral feet. He has open wounds that been present at least since the beginning of the year although he does not know an exact time. He is previously had amputations of all toes and the distal portion of his foot. 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 With that being said he does have a history of diabetes, peripheral vascular disease, nicotine dependence, and hypertension. He has not had any recent arterial or vascular studies he was noncompressible today I think he is going to require referral for arterial studies as well. 2/11; patient was readmitted to our clinic last week. He has bilateral plantar foot wounds in the setting of previous transmetatarsal amputations. He had his arterial studies done on 2/8; these were actually quite good. He was noncompressible on both sides however his waveforms were triphasic. The thought was noncompressible vessels consistent with medial calcification but without significant stenosis in the lower extremities. X-rays were done on the left this showed extensive chronic and postoperative changes without evidence of osteomyelitis. There was no evidence of osteomyelitis on the right foot there was mild vascular calcification MRI was suggested on the left based on clinical correlation. The patient arrives in clinic with some odor and drainage from the left foot. Right foot measures smaller. He is really not offloading these areas although he says he does not walk much 2/25; the patient arrives with some odor and drainage from the left foot again. Post debridement I cultured this area. He has the area on the midfoot roughly the third metatarsal head and the first metatarsal head on the right. He is in surgical shoes bilateral transmet 3/4; culture from last week showed abundant  group B strep and a few Proteus. We will put him on cephalexin starting today. Still silver alginate to the wounds 3/11; due to issues with pharmacy he still does not have the cephalexin that I prescribed a week ago. Apparently this was called into a different pharmacy on Friday but they have not delivered it to him. We have been using silver alginate on the wounds but because of lack of moisture change to silver collagen today. We are offloading as best we can in surgical shoes Objective Constitutional Sitting or standing Blood Pressure is within target range for patient.. Pulse regular and within target range for patient.Marland Kitchen Respirations regular, non-labored and within target range.. Temperature is normal and within the target range for the patient.Marland Kitchen Appears in no distress. Vitals Time Taken: 8:50 AM, Height: 73 in, Weight: 180 lbs, BMI: 23.7, Temperature: 97.7 F, Pulse: 55 bpm, Respiratory Rate: 18 breaths/min, Blood Pressure: 109/51 mmHg. General Notes: patient stated he did not check  his CBG this morning Cardiovascular Pedal pulses are palpable. General Notes: Wound exam; both wound areas again have built up callus subcutaneous tissue around the wound surface. The left plantar wound is dry and has more depth. No clear evidence of infection nothing needed culturing. Integumentary (Hair, Skin) Wound #21 status is Open. Original cause of wound was Gradually Appeared. The wound is located on the [place]. The wound measures 0.9cm length x 0.3cm width x 0.7cm depth; 0.212cm^2 area and 0.148cm^3 volume. There is Fat Layer (Subcutaneous Tissue) Exposed exposed. There is no tunneling or undermining noted. There is a medium amount of serosanguineous drainage noted. The wound margin is thickened. There is large (67- 100%) red, pink granulation within the wound bed. There is no necrotic tissue within the wound bed. Wound #22 status is Open. Original cause of wound was Gradually Appeared.  The wound is located on the Right Metatarsal head first. The wound measures 0.8cm length x 1.2cm width x 0.6cm depth; 0.754cm^2 area and 0.452cm^3 volume. There is Fat Layer (Subcutaneous Tissue) Exposed exposed. There is no tunneling or undermining noted. There is a medium amount of serosanguineous drainage noted. The wound margin is thickened. There is large (67-100%) red, pink granulation within the wound bed. There is no necrotic tissue within the wound bed. Assessment Active Problems ICD-10 Type 2 diabetes mellitus with foot ulcer Non-pressure chronic ulcer of other part of left foot with fat layer exposed Non-pressure chronic ulcer of other part of right foot with fat layer exposed Other specified peripheral vascular diseases Nicotine dependence, cigarettes, with other nicotine-induced disorders Essential (primary) hypertension Corns and callosities Procedures Wound #21 Pre-procedure diagnosis of Wound #21 is a Diabetic Wound/Ulcer of the Lower Extremity located on the [place] .Severity of Tissue Pre Debridement is: Fat layer exposed. There was a Excisional Skin/Subcutaneous Tissue Debridement with a total area of 0.5 sq cm performed by [name]., MD. With the following instrument(s): Curette to remove Non-Viable tissue/material. Material removed includes Callus, Subcutaneous Tissue, and Skin: Dermis after achieving pain control using Lidocaine 4% Topical Solution. A time out was conducted at 09:17, prior to the start of the procedure. A Minimum amount of bleeding was controlled with Pressure. The procedure was tolerated well with a pain level of 0 throughout and a pain level of 0 following the procedure. Post Debridement Measurements: 0.9cm length x 0.3cm width x 0.7cm depth; 0.148cm^3 volume. Character of Wound/Ulcer Post Debridement is improved. Severity of Tissue Post Debridement is: Fat layer exposed. Post procedure Diagnosis Wound #21: Same as  Pre-Procedure Wound #22 Pre-procedure diagnosis of Wound #22 is a Diabetic Wound/Ulcer of the Lower Extremity located on the Right Metatarsal head first .Severity of Tissue Pre Debridement is: Fat layer exposed. There was a Excisional Skin/Subcutaneous Tissue Debridement with a total area of 1.3 sq cm performed by [name]., MD. With the following instrument(s): Curette to remove Non-Viable tissue/material. Material removed includes Callus, Subcutaneous Tissue, and Skin: Dermis after achieving pain control using Lidocaine 4% Topical Solution. A time out was conducted at 09:17, prior to the start of the procedure. A Minimum amount of bleeding was controlled with Pressure. The procedure was tolerated well with a pain level of 0 throughout and a pain level of 0 following the procedure. Post Debridement Measurements: 0.8cm length x 1.2cm width x 0.6cm depth; 0.452cm^3 volume. Character of Wound/Ulcer Post Debridement is improved. Severity of Tissue Post Debridement is: Fat layer exposed. Post procedure Diagnosis Wound #22: Same as Pre-Procedure Plan Follow-up Appointments:  Return Appointment in 1 week. Dressing Change Frequency: Wound #21 Left,Plantar Foot: Change dressing three times week. Wound #22 Right Metatarsal head first: Change dressing three times week. Wound Cleansing: Clean wound with Wound Cleanser May shower with protection. Primary Wound Dressing: Wound #21 Left,Plantar Foot: Silver Collagen - with hydrogel Wound #22 Right Metatarsal head first: Silver Collagen - with hydrogel Secondary Dressing: Wound #21 Left,Plantar Foot: Foam - foam donut to offload Kerlix/Rolled Gauze Dry Gauze ABD pad - as needed Wound #22 Right Metatarsal head first: Foam - foam donut to offload Kerlix/Rolled Gauze Dry Gauze ABD pad - as needed Edema Control: Avoid standing for long periods of time Elevate legs to the level of the heart or above for 30 minutes daily and/or when  sitting, a frequency of: - throughout the day. Off-Loading: Open toe surgical shoe to: - for bilateral feet. apply felt to both surgical shoes to aid in offloading. Home Health: [facility] skilled nursing for wound care. - [facility]. Silver collagen moistened with hydrogel or K-Y jelly. I am trying to add some moisture here. 2 2. I think with his gait abnormalities were offloading this is best we can Electronic Signature(s) Signed: 08/28/2019 7:03:15 AM By: Casey Ham MD Entered By: [name] on 08/26/2019 09:46:25 -------------------------------------------------------------------------------- SuperBill Details Patient Name: Date of Service: [name], [name] 08/26/2019 Medical Record Number:[id number] Patient Account Number: [id number] Date of Birth/Sex: Treating RN: 07/03/1940 (79 y.o. [name] Primary Care Provider: Leeroy [name] Other Clinician: Referring Provider: Treating Provider/Extender:Jushua Waltman, [name], Casey Samples in Treatment: 5 Diagnosis Coding ICD-10 Codes Code Description E11.621 Type 2 diabetes mellitus with foot ulcer L97.522 Non-pressure chronic ulcer of other part of left foot with fat layer exposed L97.512 Non-pressure chronic ulcer of other part of right foot with fat layer exposed I73.89 Other specified peripheral vascular diseases F17.218 Nicotine dependence, cigarettes, with other nicotine-induced disorders I10 Essential (primary) hypertension L84 Corns and callosities Facility Procedures CPT4 Code Description: [id number] - DEB SUBQ TISSUE 20 SQ CM/< ICD-10 Diagnosis Description L97.522 Non-pressure chronic ulcer of other part of left foot with L97.512 Non-pressure chronic ulcer of other part of right foot with Modifier: fat layer ex fat layer e Quantity: 1 posed xposed Physician Procedures Electronic Signature(s) Signed: 08/28/2019 7:03:15 AM By: Casey Ham MD Entered By: [name] on  08/26/2019 09:46:36

## 2019-09-02 ENCOUNTER — Other Ambulatory Visit: Payer: Self-pay

## 2019-09-02 ENCOUNTER — Encounter (HOSPITAL_BASED_OUTPATIENT_CLINIC_OR_DEPARTMENT_OTHER): Payer: Medicare Other | Admitting: Internal Medicine

## 2019-09-02 DIAGNOSIS — E11621 Type 2 diabetes mellitus with foot ulcer: Secondary | ICD-10-CM | POA: Diagnosis not present

## 2019-09-02 NOTE — Progress Notes (Signed)
[name], [name] ([id number]) Visit Report for 09/02/2019 Arrival Information Details Patient Name: Date of Service: [name], [name] 09/02/2019 8:15 AM Medical Record Number:[id number] Patient Account Number: [id number] Date of Birth/Sex: Treating RN: Feb 13, 1941 (79 y.o. [name] Primary Care Saliou Barnier: [name] Other Clinician: Referring Kaedyn Belardo: Treating Eduar Kumpf/Extender:Robson, Birdena Crandall, [name] in Treatment: 6 Visit Information History Since Last Visit All ordered tests and consults Yes Patient Arrived: [name] were completed: Arrival Time: 08:05 Added or deleted any No Accompanied By: self medications: Transfer Assistance: None Any new allergies or adverse No Patient Identification Verified: Yes reactions: Secondary Verification Process Yes Had a fall or experienced change No Completed: in Patient Requires Transmission- No activities of daily living that may Based Precautions: affect Patient Has Alerts: Yes risk of falls: Patient Alerts: R ABI non Signs or symptoms of No compressible abuse/neglect since last visito L ABI non Hospitalized since last visit: No compressible Implantable device outside of the No clinic excluding cellular tissue based products placed in the center since last visit: Has Dressing in Place as Yes Prescribed: Has Footwear/Offloading in Place Yes as Prescribed: Left: Surgical Shoe with Pressure Relief Insole Right: Surgical Shoe with Pressure Relief Insole Pain Present Now: No Electronic Signature(s) Signed: 09/02/2019 4:45:01 PM By: Baruch Gouty RN, BSN Entered By: Baruch Gouty on 09/02/2019 08:07:46 -------------------------------------------------------------------------------- Encounter Discharge Information Details Patient Name: Date of Service: [name], [name] 09/02/2019 8:15 AM Medical Record [id number] Patient Account Number: [id number] Date of Birth/Sex: Treating RN: 1940/12/19  (79 y.o. [name] Primary Care Minor Iden: [name] Other Clinician: Referring Buffey Zabinski: Treating Lenee Franze/Extender:Robson, Birdena Crandall, [name] in Treatment: 6 Encounter Discharge Information Items Post Procedure Vitals Discharge Condition: Stable Temperature (F): 98.1 Ambulatory Status: Cane Pulse (bpm): 75 Discharge Destination: Home Respiratory Rate (breaths/min): 18 Transportation: Private Auto Blood Pressure (mmHg): 132/78 Accompanied By: self Schedule Follow-up Appointment: Yes Clinical Summary of Care: Patient Declined Electronic Signature(s) Signed: 09/02/2019 4:45:01 PM By: Baruch Gouty RN, BSN Entered By: Baruch Gouty on 09/02/2019 08:54:07 -------------------------------------------------------------------------------- Lower Extremity Assessment Details Patient Name: Date of Service: [name], [name] 09/02/2019 8:15 AM Medical Record Number:[id number] Patient Account Number: [id number] Date of Birth/Sex: Treating RN: Jul 15, 1940 (79 y.o. [name] Primary Care Teddie Curd: [name] Other Clinician: Referring Jahbari Repinski: Treating Latia Mataya/Extender:Robson, Birdena Crandall, [name] in Treatment: 6 Edema Assessment Assessed: [Left: No] [Right: No] Edema: [Left: Yes] [Right: No] Calf Left: Right: Point of Measurement: 32 cm From Medial Instep 29.5 cm 34 cm Ankle Left: Right: Point of Measurement: 10 cm From Medial Instep 22.2 cm 25 cm Vascular Assessment Pulses: Dorsalis Pedis Palpable: [Left:Yes] [Right:Yes] Electronic Signature(s) Signed: 09/02/2019 4:45:01 PM By: Baruch Gouty RN, BSN Entered By: Baruch Gouty on 09/02/2019 08:18:10 -------------------------------------------------------------------------------- Multi Wound Chart Details Patient Name: Date of Service: [name], [name] 09/02/2019 8:15 AM Medical Record [id number] Patient Account Number: [id number] Date of  Birth/Sex: Treating RN: September 29, 1940 (79 y.o. 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] [22:Lower Extremity] Comorbid History: [21:Cataracts, Glaucoma, Hypertension, Peripheral Hypertension, Peripheral Arterial Disease, Type II Diabetes, Osteomyelitis, Diabetes, Osteomyelitis, Neuropathy] [22:Cataracts, Glaucoma, Arterial Disease, Type II Neuropathy] [N/A:N/A]  Date Acquired: [21:06/17/2018] [22:06/17/2018] [N/A:N/A] Weeks of Treatment: [21:6] [22:6] [N/A:N/A] Wound Status: [21:Open] [22:Open] [N/A:N/A] Measurements L x W x D 0.6x0.3x0.5 [22:0.8x0.8x0.5] [N/A:N/A] (cm) Area (cm) : [21:0.141] [33:5.456] [N/A:N/A] Volume (cm) : [21:0.071] [22:0.251] [N/A:N/A] % Reduction in Area: [21:93.90%] [22:19.90%] [N/A:N/A] % Reduction in Volume: 93.90% [22:-33.50%] [N/A:N/A] Starting Position 1 12 (o'clock): Ending Position 1 [21:12] (o'clock): Maximum Distance 1 [21:0.5] (cm): Undermining: [21:Yes] [22:No] [N/A:N/A] Classification: [21:Grade 2] [22:Grade 2] [N/A:N/A] Exudate Amount: [21:Medium] [22:Medium] [N/A:N/A] Exudate Type: [21:Serosanguineous] [22:Serosanguineous] [N/A:N/A] Exudate Color: [21:red, brown] [22:red, brown] [N/A:N/A] Wound Margin: [21:Thickened] [22:Thickened] [N/A:N/A] Granulation Amount: [21:Large (67-100%)] [22:Large (67-100%)]  [N/A:N/A] Granulation Quality: [21:Red, Pink] [22:Red, Pink] [N/A:N/A] Necrotic Amount: [21:None Present (0%)] [22:None Present (0%)] [N/A:N/A] Exposed Structures: [21:Fat Layer (Subcutaneous Tissue) Exposed: Yes Fascia: No Tendon: No Muscle: No Joint: No Bone: No] [22:Fat Layer (Subcutaneous Tissue) Exposed: Yes Fascia: No Tendon: No Muscle: No Joint: No Bone: No] [N/A:N/A] Epithelialization: [21:None] [22:None] [N/A:N/A] Debridement: [21:Debridement - Selective/Open Wound] [22:Debridement - Selective/Open Wound] [N/A:N/A] Pre-procedure [21:08:29] [22:08:29] [N/A:N/A] Verification/Time Out Taken: Pain Control: [21:Lidocaine 4% Topical Solution] [22:Lidocaine 4% Topical Solution] [N/A:N/A] Tissue Debrided: [21:Callus] [22:Callus] [N/A:N/A] Level: [21:Skin/Dermis] [22:Skin/Dermis] [N/A:N/A] Debridement Area (sq cm):12 [22:1] [N/A:N/A] Instrument: [21:Blade, Curette] [22:Curette] [N/A:N/A] Bleeding: [21:None] [22:None] [N/A:N/A] Procedural Pain: [21:0] [22:0] [N/A:N/A] Post Procedural Pain: [21:0] [22:0] [N/A:N/A] Debridement Treatment Procedure was tolerated [22:Procedure was tolerated] [N/A:N/A] Response: [21:well] [22:well] Post Debridement [21:0.6x0.3x0.6] [22:0.8x0.8x0.3] [N/A:N/A] Measurements L x W x D (cm) Post Debridement [21:0.085] [25:6.389] [N/A:N/A] Volume: (cm) Procedures Performed: Debridement [22:Debridement] [N/A:N/A] Treatment Notes Electronic Signature(s) Signed: 09/02/2019 4:55:35 PM By: Deon Pilling Signed: 09/02/2019 7:02:26 PM By: Linton Ham MD Entered By: Linton Ham on 09/02/2019 08:53:10 -------------------------------------------------------------------------------- Multi-Disciplinary Care Plan Details Patient Name: Date of Service: [name], [name] 09/02/2019 8:15 AM Medical Record Number:[id number] Patient Account Number: [id number] Date of Birth/Sex: Treating RN: [birth year] (79 y.o. [name] Primary Care Katherin Ramey: [name] Other Clinician: Referring Kaymen Adrian: Treating Tura Roller/Extender:Robson, Birdena Crandall, [name] in Treatment: 6 Active Inactive Abuse / Safety / Falls / Self Care Management Nursing Diagnoses: Potential for falls Goals: Patient/caregiver will verbalize/demonstrate measures taken to prevent injury and/or falls Date Initiated: 07/21/2019 Target Resolution Date: 09/17/2019 Goal Status: Active Interventions: Assess fall risk on admission and as needed Assess impairment of mobility on admission and as needed per policy Notes: Nutrition Nursing Diagnoses: Impaired glucose control: actual or potential Potential for alteratiion in Nutrition/Potential for imbalanced nutrition Goals: Patient/caregiver will maintain therapeutic glucose control Date Initiated: 07/21/2019 Target Resolution Date: 09/17/2019 Goal Status: Active Interventions: Assess HgA1c results as ordered upon admission and as needed Assess patient nutrition upon admission and as needed per policy Provide education on elevated blood sugars and impact on wound healing Treatment Activities: Patient referred to Primary Care Physician for further nutritional evaluation : 07/21/2019 Notes: Wound/Skin Impairment Nursing Diagnoses: Impaired tissue integrity Knowledge deficit related to smoking impact on wound healing Knowledge deficit related to ulceration/compromised skin integrity Goals: Patient will demonstrate a reduced rate of smoking or cessation of smoking Date Initiated: 07/21/2019 Target Resolution Date: 09/17/2019 Goal Status: Active Patient/caregiver will verbalize understanding of skin care regimen Date Initiated: 07/21/2019 Date Inactivated: 07/29/2019 Target Resolution Date: 08/18/2019 Goal Status: Met Ulcer/skin breakdown will have a volume reduction of 30% by week 4 Date Initiated: 07/21/2019 Target Resolution Date: 09/17/2019 Goal Status: Active Interventions: Assess patient/caregiver ability to  obtain necessary supplies Assess patient/caregiver ability to perform ulcer/skin care regimen upon admission and as needed Assess ulceration(s) every visit Provide education on smoking Provide education on  ulcer and skin care Treatment Activities: Skin care regimen initiated : 07/21/2019 Topical wound management initiated : 07/21/2019 Notes: Electronic Signature(s) Signed: 09/02/2019 4:55:35 PM By: Deon Pilling Entered By: Deon Pilling on 09/02/2019 08:08:16 -------------------------------------------------------------------------------- Pain Assessment Details Patient Name: Date of Service: [name], [name] 09/02/2019 8:15 AM Medical Record Number:[id number] Patient Account Number: [id number] Date of Birth/Sex: Treating RN: 1941-04-01 (79 y.o. [name] Primary Care Karmyn Lowman: [name] Other Clinician: Referring Lenix Benoist: Treating Demetres Prochnow/Extender:Robson, Birdena Crandall, [name] in Treatment: 6 Active Problems Location of Pain Severity and Description of Pain Patient Has Paino Yes Site Locations Pain Location: Pain in Ulcers With Dressing Change: No Duration of the Pain. Constant / Intermittento Intermittent Rate the pain. Current Pain Level: 0 Worst Pain Level: 4 Least Pain Level: 0 Character of Pain Describe the Pain: Sharp Pain Management and Medication Current Pain Management: Other: tolerable Is the Current Pain Management Adequate: Adequate How does your wound impact your activities of daily livingo Sleep: No Bathing: No Appetite: No Relationship With Others: No Bladder Continence: No Emotions: No Bowel Continence: No Work: No Toileting: No Drive: No Dressing: No Hobbies: No Electronic Signature(s) Signed: 09/02/2019 4:45:01 PM By: Baruch Gouty RN, BSN Entered By: Baruch Gouty on 09/02/2019 08:11:15 -------------------------------------------------------------------------------- Patient/Caregiver Education  Details Patient Name: Date of Service: [name] 3/18/2021andnbsp8:15 AM Medical Record Number:[id number] Patient Account Number: [id number] Date of Birth/Gender: Treating RN: Nov 19, 1940 (79 y.o. [name] Primary Care Physician: [name] Other Clinician: Referring Physician: Treating Physician/Extender:Robson, Birdena Crandall, [name] in Treatment: 6 Education Assessment Education Provided To: Patient Education Topics Provided Wound/Skin Impairment: Handouts: Skin Care Do's and Dont's Methods: Explain/Verbal Responses: Reinforcements needed Electronic Signature(s) Signed: 09/02/2019 4:55:35 PM By: Deon Pilling Entered By: Deon Pilling on 09/02/2019 08:08:27 -------------------------------------------------------------------------------- Wound Assessment Details Patient Name: Date of Service: [name], [name] 09/02/2019 8:15 AM Medical Record [id number] Patient Account Number: [id number] Date of Birth/Sex: Treating RN: [birth year] (78 y.o. [name] Primary Care Reveca Desmarais: [name] Other Clinician: Referring Nachelle Negrette: Treating Jonell Brumbaugh/Extender:Robson, Birdena Crandall, [name] in Treatment: 6 Wound Status Wound Number: 21 Primary Diabetic Wound/Ulcer of the Lower Extremity Etiology: Wound Location: Left Foot - Plantar Wound Open Wounding Event: Gradually Appeared Status: Date Acquired: 06/17/2018 Comorbid Cataracts, Glaucoma, Hypertension, Weeks Of Treatment: 6 History: Peripheral Arterial Disease, Type II Diabetes, Clustered Wound: No Osteomyelitis, Neuropathy Wound Measurements Length: (cm) 0.6 % Reduction Width: (cm) 0.3 % Reduction Depth: (cm) 0.5 Epitheliali Area: (cm) 0.141 Tunneling: Volume: (cm) 0.071 Underminin Starting Ending P Maximum in Area: 93.9% in Volume: 93.9% zation: None No g: Yes Position (o'clock): 12 osition (o'clock): 12 Distance: (cm) 0.5 Wound  Description Classification: Grade 2 Foul Odor Wound Margin: Thickened Slough/Fib Exudate Amount: Medium Exudate Type: Serosanguineous Exudate Color: red, brown Wound Bed Granulation Amount: Large (67-100%) Granulation Quality: Red, Pink Fascia Exp Necrotic Amount: None Present (0%) Fat Layer Tendon Exp Muscle Exp Joint Expo Bone Expos After Cleansing: No rino No Exposed Structure osed: No (Subcutaneous Tissue) Exposed: Yes osed: No osed: No sed: No ed: No Treatment Notes Wound #21 (Left, Plantar Foot) 2. Periwound Care Moisturizing lotion 3. Primary Dressing Applied Collegen AG 4. Secondary Dressing Dry Gauze Roll Gauze Foam 7. Footwear/Offloading device applied Surgical shoe Electronic Signature(s) Signed: 09/02/2019 4:45:01 PM By: Baruch Gouty RN, BSN Entered By: Baruch Gouty on 09/02/2019 08:19:26 -------------------------------------------------------------------------------- Wound Assessment Details Patient Name: Date of Service: [name], [name] 09/02/2019 8:15 AM Medical Record Number:[id number] Patient Account Number: [id number] Date of Birth/Sex: Treating RN: 03/04/1941 (79 y.o. [name] Primary  Care Thamar Holik: [name] Other Clinician: Referring Shatarra Wehling: Treating Toni Hoffmeister/Extender:Robson, Birdena Crandall, [name] in Treatment: 6 Wound Status Wound Number: 22 Primary Diabetic Wound/Ulcer of the Lower Extremity Etiology: Wound Location: Right Metatarsal head first Wound Open Wounding Event: Gradually Appeared Status: Date Acquired: 06/17/2018 Comorbid Cataracts, Glaucoma, Hypertension, Weeks Of Treatment: 6 History: Peripheral Arterial Disease, Type II Diabetes, Clustered Wound: No Osteomyelitis, Neuropathy Wound Measurements Length: (cm) 0.8 % Reduction in Width: (cm) 0.8 % Reduction in Depth: (cm) 0.5 Epithelializati Area: (cm) 0.503 Tunneling: Volume: (cm) 0.251 Undermining: Wound  Description Classification: Grade 2 Foul Odor After Wound Margin: Thickened Slough/Fibrino Exudate Amount: Medium Exudate Type: Serosanguineous Exudate Color: red, brown Wound Bed Granulation Amount: Large (67-100%) Granulation Quality: Red, Pink Fascia Exposed: Necrotic Amount: None Present (0%) Fat Layer (Subc Tendon Exposed: Muscle Exposed: Joint Exposed: Bone Exposed: Cleansing: No No Exposed Structure No utaneous Tissue) Exposed: Yes No No No No Area: 19.9% Volume: -33.5% on: None No No Treatment Notes Wound #22 (Right Metatarsal head first) 2. Periwound Care Moisturizing lotion 3. Primary Dressing Applied Collegen [employer] 4. Secondary Dressing Dry Gauze Roll Gauze Foam 7. Footwear/Offloading device applied Surgical shoe Electronic Signature(s) Signed: 09/02/2019 4:45:01 PM By: Baruch Gouty RN, BSN Entered By: Baruch Gouty on 09/02/2019 08:19:43 -------------------------------------------------------------------------------- Vitals Details Patient Name: Date of Service: [name], [name] 09/02/2019 8:15 AM Medical Record [id number] Patient Account Number: [id number] Date of Birth/Sex: Treating RN: 12/20/1940 (78 y.o. [name] Primary Care Kassity Woodson: [name] Other Clinician: Referring Jaquavian Firkus: Treating Holdan Stucke/Extender:Robson, Birdena Crandall, [name] in Treatment: 6 Vital Signs Time Taken: 08:07 Temperature (F): 98.1 Height (in): 73 Pulse (bpm): 75 Source: Stated Respiratory Rate (breaths/min): 18 Weight (lbs): 180 Blood Pressure (mmHg): 132/78 Source: Stated Capillary Blood Glucose (mg/dl): 98 Body Mass Index (BMI): 23.7 Reference Range: 80 - 120 mg / dl Notes glucose per pt report on Monday Electronic Signature(s) Signed: 09/02/2019 4:45:01 PM By: Baruch Gouty RN, BSN Entered By: Baruch Gouty on 09/02/2019 08:09:59

## 2019-09-02 NOTE — Progress Notes (Signed)
[name], [name] Visit Report for 09/02/2019 Debridement Details Patient Name: Date of Service: Casey, [name] 09/02/2019 8:15 AM Medical Record Number:[id number] Patient Account Number: [id number] Date of Birth/Sex: Treating RN: [birth year] (79 y.o. [name] Primary Care Provider: [name] Other Clinician: Referring Provider: [name] Treating Provider/Extender:Cloie Wooden, Esperanza Richters in Treatment: 6 Debridement Performed for Wound #21 Left,Plantar Foot Assessment: Performed By: Physician Ricard Dillon., MD Debridement Type: Debridement Severity of Tissue Pre Fat layer exposed Debridement: Level of Consciousness (Pre- Awake and Alert procedure): Pre-procedure Verification/Time Out Taken: Yes - 08:29 Start Time: 08:30 Pain Control: Lidocaine 4% Topical Solution Total Area Debrided (L x W): 4 (cm) x 3 (cm) = 12 (cm) Tissue and other material Viable, Non-Viable, Callus, Skin: Dermis debrided: Level: Skin/Dermis Debridement Description: Selective/Open Wound Instrument: Blade, Curette Bleeding: None End Time: 08:33 Procedural Pain: 0 Post Procedural Pain: 0 Response to Treatment: Procedure was tolerated well Level of Consciousness Awake and Alert (Post-procedure): Post Debridement Measurements of Total Wound Length: (cm) 0.6 Width: (cm) 0.3 Depth: (cm) 0.6 Volume: (cm) 0.085 Character of Wound/Ulcer Post Improved Debridement: Severity of Tissue Post Debridement: Fat layer exposed Post Procedure Diagnosis Same as Pre-procedure Electronic Signature(s) Signed: 09/02/2019 4:55:35 PM By: Deon Pilling Signed: 09/02/2019 7:02:26 PM By: Linton Ham MD Entered By: Linton Ham on 09/02/2019 08:53:20 -------------------------------------------------------------------------------- Debridement Details Patient Name: Date of Service: Casey, [name] 09/02/2019 8:15 AM Medical Record [id number] Patient Account Number:  [id number] Date of Birth/Sex: Treating RN: [birth year] (79 y.o. [name] Primary Care Provider: [name] Other Clinician: Referring Provider: [name] Treating Provider/Extender:Lavanna Rog, Esperanza Richters in Treatment: 6 Debridement Performed for Wound #22 Right Metatarsal head first Assessment: Performed By: Physician Ricard Dillon., MD Debridement Type: Debridement Severity of Tissue Pre Fat layer exposed Debridement: Level of Consciousness (Pre- Awake and Alert procedure): Pre-procedure Yes - 08:29 Verification/Time Out Taken: Start Time: 08:30 Pain Control: Lidocaine 4% Topical Solution Total Area Debrided (L x W): 1 (cm) x 1 (cm) = 1 (cm) Tissue and other material Viable, Non-Viable, Callus, Skin: Dermis debrided: Level: Skin/Dermis Debridement Description: Selective/Open Wound Instrument: Curette Bleeding: None End Time: 08:33 Procedural Pain: 0 Post Procedural Pain: 0 Response to Treatment: Procedure was tolerated well Level of Consciousness Awake and Alert (Post-procedure): Post Debridement Measurements of Total Wound Length: (cm) 0.8 Width: (cm) 0.8 Depth: (cm) 0.3 Volume: (cm) 0.151 Character of Wound/Ulcer Post Improved Debridement: Severity of Tissue Post Debridement: Fat layer exposed Post Procedure Diagnosis Same as Pre-procedure Electronic Signature(s) Signed: 09/02/2019 4:55:35 PM By: Deon Pilling Signed: 09/02/2019 7:02:26 PM By: Linton Ham MD Entered By: Linton Ham on 09/02/2019 08:53:29 -------------------------------------------------------------------------------- HPI Details Patient Name: Date of Service: Casey, [name] 09/02/2019 8:15 AM Medical Record [id number] Patient Account Number: [id number] Date of Birth/Sex: Treating RN: January 26, 1941 (79 y.o. [name] Primary Care Provider: [name] Other Clinician: Referring Provider: Treating Provider/Extender:Valerye Kobus,  Birdena Crandall, [name] in Treatment: 6 History of Present Illness HPI Description: very pleasant 79 year old gentleman has type 2 diabetes and has been having an ulcer on his foot for several years. Been seeing as this time around since the end of October. Classified as a Wagner grade 2 because his previous x-rays did not show any problems. He has an MRI pending this Friday. discussing with him he says he has not been able to get his diabetic shoes yet. He continues to smoke his pipe and drinks moonshine and says he will not give this up. I have tried to counsel him regarding this but he  firmly says that he is happy to listen to me but he is not going to give up his habits. 08/17/14 -- patient has no fresh complaints but has recently come with his MRI which was done on 08/12/2014. 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 Readmission: 07/21/2019 upon evaluation today patient presents for initial inspection here in our clinic concerning issues that he has been having with his bilateral feet. He has open wounds that been present at least since the beginning of the year although he does not know an exact time. He is previously had amputations of all toes and the distal portion of his foot. 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 With that being said he does have a history of diabetes, peripheral vascular disease, nicotine dependence, and hypertension. He has not had any recent arterial or vascular studies he was noncompressible today I think he is going to require referral for arterial studies as well. 2/11; patient was readmitted to our clinic last week. He has bilateral plantar foot wounds in the setting of previous transmetatarsal amputations. He had his arterial studies done on 2/8; these were actually quite good. He was noncompressible on both sides  however his waveforms were triphasic. The thought was noncompressible vessels consistent with medial calcification but without significant stenosis in the lower extremities. X-rays were done on the left this showed extensive chronic and postoperative changes without evidence of osteomyelitis. There was no evidence of osteomyelitis on the right foot there was mild vascular calcification MRI was suggested on the left based on clinical correlation. The patient arrives in clinic with some odor and drainage from the left foot. Right foot measures smaller. He is really not offloading these areas although he says he does not walk much 2/25; the patient arrives with some odor and drainage from the left foot again. Post debridement I cultured this area. He has the area on the midfoot roughly the third metatarsal head and the first metatarsal head on the right. He is in surgical shoes bilateral transmet 3/4; culture from last week showed abundant group B strep and a few Proteus. We will put him on cephalexin starting today. Still silver alginate to the wounds 3/11; due to issues with pharmacy he still does not have the cephalexin that I prescribed a week ago. Apparently this was called into a different pharmacy on Friday but they have not delivered it to him. We have been using silver alginate on the wounds but because of lack of moisture change to silver collagen today. We are offloading as best we can in surgical shoes 3/18; he is taking the cephalexin that I prescribed 2 weeks ago. Using silver collagen. He has surgical shoes. Setting of bilateral previous transmetatarsal amputations. Wounds are mirror-image wounds medially. Electronic Signature(s) Signed: 09/02/2019 7:02:26 PM By: Linton Ham MD Entered By: Linton Ham on 09/02/2019 08:54:21 -------------------------------------------------------------------------------- Physical Exam Details Patient Name: Date of Service: MARCELINO, PELLECCHIA  09/02/2019 8:15 AM Medical Record [id number] Patient Account Number: [id number] Date of Birth/Sex: Treating RN: 1940-09-21 (79 y.o. [name] Primary Care Provider: [name] Other Clinician: Referring Provider: Treating Provider/Extender:Trenell Concannon, Birdena Crandall, [name] in Treatment: 6 Constitutional Sitting or standing Blood Pressure is within target range for patient.. Pulse regular and within target range for patient.Marland Kitchen Respirations regular, non-labored and within target range.. Temperature is normal and within the target range for the patient.Marland Kitchen Appears in no distress. Notes  Wound exam; both wound areas have built up callus that I removed also some skin around the edges. Hemostasis with direct pressure. I removed tissue around the circumference with a #3 curette and then a copious amount of callus with a #10 scalpel from the area on the left. The wounds themselves do not look too bad before I did the callus 0.6 cm in depth on the left hand 0.3 on the right Electronic Signature(s) Signed: 09/02/2019 7:02:26 PM By: Linton Ham MD Entered By: Linton Ham on 09/02/2019 08:55:27 -------------------------------------------------------------------------------- Physician Orders Details Patient Name: Date of Service: PARTHA, HORTA 09/02/2019 8:15 AM Medical Record [id number] Patient Account Number: [id number] Date of Birth/Sex: Treating RN: 08-09-1940 (79 y.o. [name] Primary Care Provider: [name] Other Clinician: Referring Provider: Treating Provider/Extender:Calina Patrie, Birdena Crandall, [name] in Treatment: 6 Verbal / Phone Orders: No Diagnosis Coding ICD-10 Coding Code Description E11.621 Type 2 diabetes mellitus with foot ulcer L97.522 Non-pressure chronic ulcer of other part of left foot with fat layer exposed L97.512 Non-pressure chronic ulcer of other part of right foot with fat layer  exposed I73.89 Other specified peripheral vascular diseases F17.218 Nicotine dependence, cigarettes, with other nicotine-induced disorders I10 Essential (primary) hypertension L84 Corns and callosities Follow-up Appointments Return Appointment in 1 week. Dressing Change Frequency Change dressing three times week. Wound Cleansing Clean wound with Wound Cleanser May shower with protection. Primary Wound Dressing Wound #21 Left,Plantar Foot Silver Collagen - with hydrogel Wound #22 Right Metatarsal head first Silver Collagen - with hydrogel Secondary Dressing Wound #21 Left,Plantar Foot Foam - foam donut to offload Kerlix/Rolled Gauze Dry Gauze ABD pad - as needed Wound #22 Right Metatarsal head first Foam - foam donut to offload Kerlix/Rolled Gauze Dry Gauze ABD pad - as needed Edema Control Avoid standing for long periods of time Elevate legs to the level of the heart or above for 30 minutes daily and/or when sitting, a frequency of: - throughout the day. Off-Loading Open toe surgical shoe to: - for bilateral feet. apply felt to both surgical shoes to aid in offloading. [facility] for wound care. - [facility]) Signed: 09/02/2019 4:55:35 PM By: Deon Pilling Signed: 09/02/2019 7:02:26 PM By: Linton Ham MD Entered By: Deon Pilling on 09/02/2019 08:35:20 -------------------------------------------------------------------------------- Problem List Details Patient Name: Date of Service: GADSDEN, CAEZ 09/02/2019 8:15 AM Medical Record [id number] Patient Account Number: [id number] Date of Birth/Sex: Treating RN: 05/25/1941 (79 y.o. [name], Meta.Reding Primary Care Provider: [name] Other Clinician: Referring Provider: Treating Provider/Extender:Bana Borgmeyer, Birdena Crandall, [name] in Treatment: 6 Active Problems ICD-10 Evaluated Encounter Code Description Active Date Today  Diagnosis E11.621 Type 2 diabetes mellitus with foot ulcer 07/21/2019 No Yes L97.522 Non-pressure chronic ulcer of other part of left foot 07/21/2019 No Yes with fat layer exposed L97.512 Non-pressure chronic ulcer of other part of right foot 07/21/2019 No Yes with fat layer exposed I73.89 Other specified peripheral vascular diseases 07/21/2019 No Yes F17.218 Nicotine dependence, cigarettes, with other nicotine- 07/21/2019 No Yes induced disorders I10 Essential (primary) hypertension 07/21/2019 No Yes L84 Corns and callosities 07/21/2019 No Yes Inactive Problems Resolved Problems Electronic Signature(s) Signed: 09/02/2019 7:02:26 PM By: Linton Ham MD Entered By: Linton Ham on 09/02/2019 08:52:50 -------------------------------------------------------------------------------- Progress Note Details Patient Name: Date of Service: GIANCARLO, VANDALL 09/02/2019 8:15 AM Medical Record [id number] Patient Account Number: [id number] Date of Birth/Sex: Treating RN: Mar 17, 1941 (79 y.o. [name] Primary Care Provider: [name] Other Clinician: Referring Provider: Treating Provider/Extender:Alp Goldwater, Birdena Crandall, Ronie Spies  Weeks in Treatment: 6 Subjective History of Present Illness (HPI) very pleasant 79 year old gentleman has type 2 diabetes and has been having an ulcer on his foot for several years. Been seeing as this time around since the end of October. Classified as a Wagner grade 2 because his previous x- rays did not show any problems. He has an MRI pending this Friday. discussing with him he says he has not been able to get his diabetic shoes yet. He continues to smoke his pipe and drinks moonshine and says he will not give this up. I have tried to counsel him regarding this but he firmly says that he is happy to listen to me but he is not going to give up his habits. 08/17/14 -- patient has no fresh complaints but has recently come with his MRI which was done on  08/12/2014. 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 Readmission: 07/21/2019 upon evaluation today patient presents for initial inspection here in our clinic concerning issues that he has been having with his bilateral feet. He has open wounds that been present at least since the beginning of the year although he does not know an exact time. He is previously had amputations of all toes and the distal portion of his foot. 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 With that being said he does have a history of diabetes, peripheral vascular disease, nicotine dependence, and hypertension. He has not had any recent arterial or vascular studies he was noncompressible today I think he is going to require referral for arterial studies as well. 2/11; patient was readmitted to our clinic last week. He has bilateral plantar foot wounds in the setting of previous transmetatarsal amputations. He had his arterial studies done on 2/8; these were actually quite good. He was noncompressible on both sides however his waveforms were triphasic. The thought was noncompressible vessels consistent with medial calcification but without significant stenosis in the lower extremities. X-rays were done on the left this showed extensive chronic and postoperative changes without evidence of osteomyelitis. There was no evidence of osteomyelitis on the right foot there was mild vascular calcification MRI was suggested on the left based on clinical correlation. The patient arrives in clinic with some odor and drainage from the left foot. Right foot measures smaller. He is really not offloading these areas although he says he does not walk much 2/25; the patient arrives with some odor and drainage from the left foot again. Post debridement I cultured this area. He has the area on the midfoot roughly the  third metatarsal head and the first metatarsal head on the right. He is in surgical shoes bilateral transmet 3/4; culture from last week showed abundant group B strep and a few Proteus. We will put him on cephalexin starting today. Still silver alginate to the wounds 3/11; due to issues with pharmacy he still does not have the cephalexin that I prescribed a week ago. Apparently this was called into a different pharmacy on Friday but they have not delivered it to him. We have been using silver alginate on the wounds but because of lack of moisture change to silver collagen today. We are offloading as best we can in surgical shoes 3/18; he is taking the cephalexin that I prescribed 2 weeks ago. Using silver collagen. He has surgical shoes. Setting of bilateral previous transmetatarsal amputations. Wounds are mirror-image wounds medially. Objective Constitutional Sitting  or standing Blood Pressure is within target range for patient.. Pulse regular and within target range for patient.Marland Kitchen Respirations regular, non-labored and within target range.. Temperature is normal and within the target range for the patient.Marland Kitchen Appears in no distress. Vitals Time Taken: 8:07 AM, Height: 73 in, Source: Stated, Weight: 180 lbs, Source: Stated, BMI: 23.7, Temperature: 98.1 F, Pulse: 75 bpm, Respiratory Rate: 18 breaths/min, Blood Pressure: 132/78 mmHg, Capillary Blood Glucose: 98 mg/dl. General Notes: glucose per pt report on Monday General Notes: Wound exam; both wound areas have built up callus that I removed also some skin around the edges. Hemostasis with direct pressure. I removed tissue around the circumference with a #3 curette and then a copious amount of callus with a #10 scalpel from the area on the left. The wounds themselves do not look too bad before I did the callus 0.6 cm in depth on the left hand 0.3 on the right Integumentary (Hair, Skin) Wound #21 status is Open. Original cause of wound was  Gradually Appeared. The wound is located on the [place]. The wound measures 0.6cm length x 0.3cm width x 0.5cm depth; 0.141cm^2 area and 0.071cm^3 volume. There is Fat Layer (Subcutaneous Tissue) Exposed exposed. There is no tunneling noted, however, there is undermining starting at 12:00 and ending at 12:00 with a maximum distance of 0.5cm. There is a medium amount of serosanguineous drainage noted. The wound margin is thickened. There is large (67-100%) red, pink granulation within the wound bed. There is no necrotic tissue within the wound bed. Wound #22 status is Open. Original cause of wound was Gradually Appeared. The wound is located on the Right Metatarsal head first. The wound measures 0.8cm length x 0.8cm width x 0.5cm depth; 0.503cm^2 area and 0.251cm^3 volume. There is Fat Layer (Subcutaneous Tissue) Exposed exposed. There is no tunneling or undermining noted. There is a medium amount of serosanguineous drainage noted. The wound margin is thickened. There is large (67-100%) red, pink granulation within the wound bed. There is no necrotic tissue within the wound bed. Assessment Active Problems ICD-10 Type 2 diabetes mellitus with foot ulcer Non-pressure chronic ulcer of other part of left foot with fat layer exposed Non-pressure chronic ulcer of other part of right foot with fat layer exposed Other specified peripheral vascular diseases Nicotine dependence, cigarettes, with other nicotine-induced disorders Essential (primary) hypertension Corns and callosities Procedures Wound #21 Pre-procedure diagnosis of Wound #21 is a Diabetic Wound/Ulcer of the Lower Extremity located on the [place] .Severity of Tissue Pre Debridement is: Fat layer exposed. There was a Selective/Open Wound Skin/Dermis Debridement with a total area of 12 sq cm performed by Ricard Dillon., MD. With the following instrument(s): Blade, and Curette to remove Viable and Non-Viable  tissue/material. Material removed includes Callus and Skin: Dermis and after achieving pain control using Lidocaine 4% Topical Solution. A time out was conducted at 08:29, prior to the start of the procedure. There was no bleeding. The procedure was tolerated well with a pain level of 0 throughout and a pain level of 0 following the procedure. Post Debridement Measurements: 0.6cm length x 0.3cm width x 0.6cm depth; 0.085cm^3 volume. Character of Wound/Ulcer Post Debridement is improved. Severity of Tissue Post Debridement is: Fat layer exposed. 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 There was a Selective/Open Wound  Skin/Dermis Debridement with a total area of 1 sq cm performed by Ricard Dillon., MD. With the following instrument(s): Curette to remove Viable and Non-Viable tissue/material. Material removed includes Callus and Skin: Dermis and after achieving pain control using Lidocaine 4% Topical Solution. A time out was conducted at 08:29, prior to the start of the procedure. There was no bleeding. The procedure was tolerated well with a pain level of 0 throughout and a pain level of 0 following the procedure. Post Debridement Measurements: 0.8cm length x 0.8cm width x 0.3cm depth; 0.151cm^3 volume. Character of Wound/Ulcer Post Debridement is improved. Severity of Tissue Post Debridement is: Fat layer exposed. Post procedure Diagnosis Wound #22: Same as Pre-Procedure Plan Follow-up Appointments: Return Appointment in 1 week. Dressing Change Frequency: Change dressing three times week. Wound Cleansing: Clean wound with Wound Cleanser May shower with protection. Primary Wound Dressing: Wound #21 Left,Plantar Foot: Silver Collagen - with hydrogel Wound #22 Right  Metatarsal head first: Silver Collagen - with hydrogel Secondary Dressing: Wound #21 Left,Plantar Foot: Foam - foam donut to offload Kerlix/Rolled Gauze Dry Gauze ABD pad - as needed Wound #22 Right Metatarsal head first: Foam - foam donut to offload Kerlix/Rolled Gauze Dry Gauze ABD pad - as needed Edema Control: Avoid standing for long periods of time Elevate legs to the level of the heart or above for 30 minutes daily and/or when sitting, a frequency of: - throughout the day. Off-Loading: Open toe surgical shoe to: - for bilateral feet. apply felt to both surgical shoes to aid in offloading. Home Health: [facility] for wound care. - [facility] 1. Continue silver collagen to both areas 2. We have foam on the soles of his surgical shoes we will try to build that up especially on the left to avoid the amount of callus 3. I have asked him to attempt to stay off this is much as possible. I do not think there is another good option for offloading here 4. He is taking the cephalexin. No evidence of ongoing infection Electronic Signature(s) Signed: 09/02/2019 7:02:26 PM By: Linton Ham MD Entered By: Linton Ham on 09/02/2019 08:56:30 -------------------------------------------------------------------------------- SuperBill Details Patient Name: Date of Service: ARATH, OHARROW 09/02/2019 Medical Record Number:[id number] Patient Account Number: [id number] Date of Birth/Sex: Treating RN: [birth year] (79 y.o. [name] Primary Care Provider: [name] Other Clinician: Referring Provider: Treating Provider/Extender:Verlyn Dannenberg, Birdena Crandall, [name] in Treatment: 6 Diagnosis Coding ICD-10 Codes Code Description E11.621 Type 2 diabetes mellitus with foot ulcer L97.522 Non-pressure chronic ulcer of other part of left foot with fat layer exposed L97.512 Non-pressure chronic ulcer of other part of right foot with fat layer  exposed I73.89 Other specified peripheral vascular diseases F17.218 Nicotine dependence, cigarettes, with other nicotine-induced disorders I10 Essential (primary) hypertension L84 Corns and callosities Facility Procedures CPT4 Code Description: [id number] - DEBRIDE WOUND 1ST 20 SQ CM OR < ICD-10 Diagnosis Description L97.522 Non-pressure chronic ulcer of other part of left foot wit L97.512 Non-pressure chronic ulcer of other part of right foot wi E11.621 Type 2  diabetes mellitus with foot ulcer Modifier: h fat layer ex th fat layer e Quantity: 1 posed xposed Physician Procedures Electronic Signature(s) Signed: 09/02/2019 7:02:26 PM By: Linton Ham MD Entered By: Linton Ham on 09/02/2019 08:57:27

## 2019-09-09 ENCOUNTER — Other Ambulatory Visit: Payer: Self-pay

## 2019-09-09 ENCOUNTER — Encounter (HOSPITAL_BASED_OUTPATIENT_CLINIC_OR_DEPARTMENT_OTHER): Payer: Medicare Other | Admitting: Internal Medicine

## 2019-09-09 DIAGNOSIS — E11621 Type 2 diabetes mellitus with foot ulcer: Secondary | ICD-10-CM | POA: Diagnosis not present

## 2019-09-09 NOTE — Progress Notes (Signed)
TAKEO, HARTS ([id number]) Visit Report for 09/09/2019 Arrival Information Details Patient Name: Date of Service: [name], [name] 09/09/2019 8:15 AM Medical Record Number:[id number] Patient Account Number: [id number] Date of Birth/Sex: Treating RN: 1940-10-04 (79 y.o. [name] Primary Care Tasha Jindra: [name] Other Clinician: Referring Kinnedy Mongiello: Treating Renelda Kilian/Extender:Robson, Birdena Crandall, [name] in Treatment: 7 Visit Information History Since Last Visit Added or deleted any medications: No Patient Arrived: [name] Any new allergies or adverse reactions: No Arrival Time: 07:50 Had a fall or experienced change in No Accompanied By: self activities of daily living that may affect Transfer Assistance: None risk of falls: Patient Identification Verified: Yes Signs or symptoms of abuse/neglect since last No Secondary Verification Process Yes visito Completed: Hospitalized since last visit: No Patient Requires Transmission-Based No Implantable device outside of the clinic excluding No Precautions: cellular tissue based products placed in the center Patient Has Alerts: Yes since last visit: Patient Alerts: R ABI non Has Dressing in Place as Prescribed: Yes compressible Pain Present Now: No L ABI non compressible Electronic Signature(s) Signed: 09/09/2019 6:20:13 PM By: Kela Millin Entered By: Kela Millin on 09/09/2019 07:50:55 -------------------------------------------------------------------------------- Encounter Discharge Information Details Patient Name: Date of Service: [name], [name] 09/09/2019 8:15 AM Medical Record [id number] Patient Account Number: [id number] Date of Birth/Sex: Treating RN: 1941-03-31 (79 y.o. [name] Primary Care Arnella Pralle: [name] Other Clinician: Referring Alonie Gazzola: Treating Mcdaniel Ohms/Extender:Robson, Birdena Crandall, [name] in Treatment: 7 Encounter  Discharge Information Items Post Procedure Vitals Discharge Condition: Stable Temperature (F): 97.9 Ambulatory Status: Cane Pulse (bpm): 65 Discharge Destination: Home Respiratory Rate (breaths/min): 18 Transportation: Private Auto Blood Pressure (mmHg): 139/74 Accompanied By: self Schedule Follow-up Appointment: Yes Clinical Summary of Care: Patient Declined Electronic Signature(s) Signed: 09/09/2019 5:37:17 PM By: Baruch Gouty RN, BSN Entered By: Baruch Gouty on 09/09/2019 09:00:04 -------------------------------------------------------------------------------- Lower Extremity Assessment Details Patient Name: Date of Service: [name], [name] 09/09/2019 8:15 AM Medical Record Number:[id number] Patient Account Number: [id number] Date of Birth/Sex: Treating RN: 07/23/1940 (79 y.o. [name] Primary Care Tristram Milian: [name] Other Clinician: Referring Adalene Gulotta: Treating Jasmin Winberry/Extender:Robson, Birdena Crandall, [name] in Treatment: 7 Edema Assessment Assessed: [Left: No] [Right: No] Edema: [Left: No] [Right: No] Calf Left: Right: Point of Measurement: 32 cm From Medial Instep 30 cm 34.5 cm Ankle Left: Right: Point of Measurement: 10 cm From Medial Instep 22.5 cm 25.5 cm Vascular Assessment Pulses: Dorsalis Pedis Palpable: [Left:Yes] [Right:Yes] Electronic Signature(s) Signed: 09/09/2019 6:20:13 PM By: Kela Millin Entered By: Kela Millin on 09/09/2019 07:52:51 -------------------------------------------------------------------------------- Multi Wound Chart Details Patient Name: Date of Service: [name], [name] 09/09/2019 8:15 AM Medical Record [id number] Patient Account Number: [id number] Date of Birth/Sex: Treating RN: [birth year] (79 y.o. 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] [22:Lower Extremity] Comorbid History: [21:Cataracts, Glaucoma, Hypertension, Peripheral Hypertension, Peripheral Arterial Disease, Type II Diabetes, Osteomyelitis, Diabetes, Osteomyelitis, Neuropathy] [22:Cataracts, Glaucoma, Arterial Disease, Type II Neuropathy] [N/A:N/A] Date Acquired: [21:06/17/2018] [22:06/17/2018] [N/A:N/A] Weeks of Treatment: [21:7] [22:7] [N/A:N/A] Wound Status: [21:Open] [22:Open] [N/A:N/A] Measurements L x W x D 0.3x0.3x0.2 [22:0.8x0.7x0.7] [N/A:N/A] (cm) Area (cm) : [26:4.158] [22:0.44] [N/A:N/A] Volume (cm) : [30:9.407] [68:0.881] [N/A:N/A] %  Reduction in Area: [21:96.90%] [22:29.90%] [N/A:N/A] % Reduction in Volume: 98.80% [22:-63.80%] [N/A:N/A] Starting Position 1 [21:9] [22:10] (o'clock): Ending Position 1 [21:11] [22:4] (o'clock): Maximum Distance 1 [21:0.5] [22:0.4] (cm): Undermining: [21:Yes] [22:Yes] [N/A:N/A] Classification: [21:Grade 2] [22:Grade 2] [N/A:N/A] Exudate Amount: [21:Medium] [22:Medium] [N/A:N/A] Exudate Type: [21:Serosanguineous] [22:Serosanguineous] [N/A:N/A] Exudate Color: [21:red, brown] [22:red, brown] [N/A:N/A] Foul Odor After Cleansing:No [22:Yes] [N/A:N/A] Odor Anticipated Due to N/A [22:No] [N/A:N/A] Product Use: Wound Margin: [21:Thickened] [22:Thickened] [N/A:N/A] Granulation Amount: [21:Large (67-100%)] [22:Large (67-100%)] [N/A:N/A] Granulation Quality: [21:Red, Pink] [22:Red, Pink] [N/A:N/A] Necrotic Amount: [21:None Present (0%)] [22:None  Present (0%)] [N/A:N/A] Exposed Structures: [21:Fat Layer (Subcutaneous Fat Layer (Subcutaneous N/A Tissue) Exposed: Yes Fascia: No Tendon: No Muscle: No Joint: No Bone: No] [22:Tissue) Exposed: Yes Fascia: No Tendon: No Muscle: No Joint: No Bone: No] Epithelialization: [21:None] [22:None] [N/A:N/A] Debridement: [21:Debridement - Excisional] [22:Debridement - Excisional] [N/A:N/A] Pre-procedure [21:08:30] [22:08:30] [N/A:N/A] Verification/Time Out Taken: Pain Control: [21:Lidocaine 4% Topical Solution] [22:Lidocaine 4% Topical Solution] [N/A:N/A] Tissue Debrided: [21:Callus, Subcutaneous] [22:Callus, Subcutaneous] [N/A:N/A] Level: [21:Skin/Subcutaneous Tissue] [22:Skin/Subcutaneous Tissue] [N/A:N/A] Debridement Area (sq cm):0.25 [22:1] [N/A:N/A] Instrument: [21:Curette] [22:Curette] [N/A:N/A] Bleeding: [21:Minimum] [22:Minimum] [N/A:N/A] Hemostasis Achieved: [21:Pressure] [22:Pressure] [N/A:N/A] Procedural Pain: [21:0] [22:0] [N/A:N/A] Post Procedural Pain: [21:0] [22:0] [N/A:N/A] Debridement Treatment Procedure was tolerated [22:Procedure was tolerated] [N/A:N/A] Response: [21:well] [22:well] Post Debridement [21:0.5x0.3x0.4] [22:0.8x0.7x0.7] [N/A:N/A] Measurements L x W x D (cm) Post Debridement [21:0.047] [40:3.474] [N/A:N/A] Volume: (cm) Procedures Performed: Debridement [22:Debridement] [N/A:N/A] Treatment Notes Wound #21 (Left, Plantar Foot) 2. Periwound Care Moisturizing lotion 3. Primary Dressing Applied Collegen AG Hydrogel or K-Y Jelly 4. Secondary Dressing Dry Gauze Roll Gauze Foam 7. Footwear/Offloading device applied Surgical shoe Wound #22 (Right Metatarsal head first) 2. Periwound Care Moisturizing lotion 3. Primary Dressing Applied Collegen AG Hydrogel or K-Y Jelly 4. Secondary Dressing Dry Gauze Roll Gauze Foam 7. Footwear/Offloading device applied Surgical shoe Electronic Signature(s) Signed: 09/09/2019 6:03:33 PM By: Linton Ham MD Signed:  09/09/2019 6:22:19 PM By: Deon Pilling Entered By: Linton Ham on 09/09/2019 08:59:43 -------------------------------------------------------------------------------- Multi-Disciplinary Care Plan Details Patient Name: Date of Service: [name], [name] 09/09/2019 8:15 AM Medical Record Number:[id number] Patient Account Number: [id number] Date of Birth/Sex: Treating RN: 11-08-1940 (79 y.o. [name] Primary Care Carlea Badour: [name] Other Clinician: Referring Madason Rauls: Treating Charnita Trudel/Extender:Robson, Birdena Crandall, [name] in Treatment: 7 Active Inactive Abuse / Safety / Falls / Self Care Management Nursing Diagnoses: Potential for falls Goals: Patient/caregiver will verbalize/demonstrate measures taken to prevent injury and/or falls Date Initiated: 07/21/2019 Target Resolution Date: 10/15/2019 Goal Status: Active Interventions: Assess fall risk on admission and as needed Assess impairment of mobility on admission and as needed per policy Notes: Nutrition Nursing Diagnoses: Impaired glucose control: actual or potential Potential for alteratiion in Nutrition/Potential for imbalanced nutrition Goals: Patient/caregiver will maintain therapeutic glucose control Date Initiated: 07/21/2019 Target Resolution Date: 10/15/2019 Goal Status: Active Interventions: Assess HgA1c results as ordered upon admission and as needed Assess patient nutrition upon admission and as needed per policy Provide education on elevated blood sugars and impact on wound healing Treatment Activities: Patient referred to Primary Care Physician for further nutritional evaluation : 07/21/2019 Notes: Wound/Skin Impairment Nursing Diagnoses: Impaired tissue integrity Knowledge deficit related to smoking impact on wound healing Knowledge deficit related to ulceration/compromised skin integrity Goals: Patient will demonstrate a reduced rate of smoking or cessation of smoking Date  Initiated: 07/21/2019 Target Resolution Date: 09/17/2019 Goal Status: Active Patient/caregiver will verbalize understanding of skin care regimen Date Initiated: 07/21/2019 Date Inactivated: 07/29/2019 Target Resolution Date: 08/18/2019 Goal Status: Met  Ulcer/skin breakdown will have a volume reduction of 30% by week 4 Date Initiated: 07/21/2019 Target Resolution Date: 09/17/2019 Goal Status: Active Interventions: Assess patient/caregiver ability to obtain necessary supplies Assess patient/caregiver ability to perform ulcer/skin care regimen upon admission and as needed Assess ulceration(s) every visit Provide education on smoking Provide education on ulcer and skin care Treatment Activities: Skin care regimen initiated : 07/21/2019 Topical wound management initiated : 07/21/2019 Notes: Electronic Signature(s) Signed: 09/09/2019 6:22:19 PM By: Deon Pilling Entered By: Deon Pilling on 09/09/2019 07:51:37 -------------------------------------------------------------------------------- Pain Assessment Details Patient Name: Date of Service: [name], [name] 09/09/2019 8:15 AM Medical Record Number:[id number] Patient Account Number: [id number] Date of Birth/Sex: Treating RN: 1941/04/29 (79 y.o. [name] Primary Care Vidhi Delellis: [name] Other Clinician: Referring Ashaya Raftery: Treating Walda Hertzog/Extender:Robson, Birdena Crandall, [name] in Treatment: 7 Active Problems Location of Pain Severity and Description of Pain Patient Has Paino No Site Locations Pain Management and Medication Current Pain Management: Electronic Signature(s) Signed: 09/09/2019 6:20:13 PM By: Kela Millin Entered By: Kela Millin on 09/09/2019 07:51:48 -------------------------------------------------------------------------------- Patient/Caregiver Education Details Patient Name: Date of Service: [name] 3/25/2021andnbsp8:15 AM Medical Record [id number] Patient Account  Number: [id number] Date of Birth/Gender: Treating RN: 1940-08-21 (78 y.o. [name] Primary Care Physician: [name] Other Clinician: Referring Physician: Treating Physician/Extender:Robson, Birdena Crandall, [name] in Treatment: 7 Education Assessment Education Provided To: Patient Education Topics Provided Wound/Skin Impairment: Handouts: Skin Care Do's and Dont's Methods: Explain/Verbal Responses: Reinforcements needed Electronic Signature(s) Signed: 09/09/2019 6:22:19 PM By: Deon Pilling Entered By: Deon Pilling on 09/09/2019 07:51:49 -------------------------------------------------------------------------------- Wound Assessment Details Patient Name: Date of Service: [name], [name] 09/09/2019 8:15 AM Medical Record [id number] Patient Account Number: [id number] Date of Birth/Sex: Treating RN: Nov 19, 1940 (79 y.o. [name] Primary Care Jedi Catalfamo: [name] Other Clinician: Referring Joesphine Schemm: Treating Zemirah Krasinski/Extender:Robson, Birdena Crandall, [name] in Treatment: 7 Wound Status Wound Number: 21 Primary Diabetic Wound/Ulcer of the Lower Extremity Etiology: Wound Location: Left Foot - Plantar Wound Open Wounding Event: Gradually Appeared Status: Date Acquired: 06/17/2018 Comorbid Cataracts, Glaucoma, Hypertension, Weeks Of Treatment: 7 History: Peripheral Arterial Disease, Type II Diabetes, Clustered Wound: No Osteomyelitis, Neuropathy Wound Measurements Length: (cm) 0.3 % Reduction in Ar Width: (cm) 0.3 % Reduction in Vo Depth: (cm) 0.2 Epithelialization Area: (cm) 0.071 Tunneling: Volume: (cm) 0.014 Undermining: Starting Posit Ending Positio Maximum Distan ea: 96.9% lume: 98.8% : None No Yes ion (o'clock): 9 n (o'clock): 11 ce: (cm) 0.5 Wound Description Classification: Grade 2 Foul Odor After C Wound Margin: Thickened Slough/Fibrino Exudate Amount: Medium Exudate Type:  Serosanguineous Exudate Color: red, brown Wound Bed Granulation Amount: Large (67-100%) Granulation Quality: Red, Pink Fascia Exposed: Necrotic Amount: None Present (0%) Fat Layer (Subcut Tendon Exposed: Muscle Exposed: Joint Exposed: Bone Exposed: leansing: No No Exposed Structure No aneous Tissue) Exposed: Yes No No No No Treatment Notes Wound #21 (Left, Plantar Foot) 2. Periwound Care Moisturizing lotion 3. Primary Dressing Applied Collegen AG Hydrogel or K-Y Jelly 4. Secondary Dressing Dry Gauze Roll Gauze Foam 7. Footwear/Offloading device applied Surgical shoe Electronic Signature(s) Signed: 09/09/2019 6:20:13 PM By: Kela Millin Entered By: Kela Millin on 09/09/2019 07:56:46 -------------------------------------------------------------------------------- Wound Assessment Details Patient Name: Date of Service: [name], [name] 09/09/2019 8:15 AM Medical Record Number:[id number] Patient Account Number: [id number] Date of Birth/Sex: Treating RN: 08-18-1940 (79 y.o. [name] Primary Care Jaxxson Cavanah: [name] Other Clinician: Referring Satoya Feeley: Treating Mieko Kneebone/Extender:Robson, Birdena Crandall, [name] in Treatment: 7 Wound Status Wound Number: 22 Primary Diabetic Wound/Ulcer of the Lower Extremity Etiology: Wound Location: Right  Metatarsal head first Wound Open Wounding Event: Gradually Appeared Status: Date Acquired: 06/17/2018 Comorbid Cataracts, Glaucoma, Hypertension, Weeks Of Treatment: 7 History: Peripheral Arterial Disease, Type II Diabetes, Clustered Wound: No Osteomyelitis, Neuropathy Wound Measurements Length: (cm) 0.8 % Reduction Width: (cm) 0.7 % Reduction Depth: (cm) 0.7 Epitheliali Area: (cm) 0.44 Tunneling: Volume: (cm) 0.308 Underminin Starting Ending P Maximum in Area: 29.9% in Volume: -63.8% zation: None No g: Yes Position (o'clock): 10 osition (o'clock): 4 Distance: (cm)  0.4 Wound Description Classification: Grade 2 Foul Odor Wound Margin: Thickened Due to Pro Exudate Amount: Medium Slough/Fib Exudate Type: Serosanguineous Exudate Color: red, brown Wound Bed Granulation Amount: Large (67-100%) Granulation Quality: Red, Pink Fascia Exp Necrotic Amount: None Present (0%) Fat Layer Tendon Exp Muscle Exp Joint Expo Bone Expo After Cleansing: Yes duct Use: No rino No Exposed Structure osed: No (Subcutaneous Tissue) Exposed: Yes osed: No osed: No sed: No sed: No Treatment Notes Wound #22 (Right Metatarsal head first) 2. Periwound Care Moisturizing lotion 3. Primary Dressing Applied Collegen AG Hydrogel or K-Y Jelly 4. Secondary Dressing Dry Gauze Roll Gauze Foam 7. Footwear/Offloading device applied Surgical shoe Electronic Signature(s) Signed: 09/09/2019 6:20:13 PM By: Kela Millin Entered By: Kela Millin on 09/09/2019 07:57:32 -------------------------------------------------------------------------------- Vitals Details Patient Name: Date of Service: [name], [name] 09/09/2019 8:15 AM Medical Record [id number] Patient Account Number: [id number] Date of Birth/Sex: Treating RN: 10-26-1940 (79 y.o. [name] Primary Care Vinia Jemmott: [name] Other Clinician: Referring Finnlee Silvernail: Treating Renel Ende/Extender:Robson, Birdena Crandall, [name] in Treatment: 7 Vital Signs Time Taken: 07:50 Temperature (F): 97.9 Height (in): 73 Pulse (bpm): 65 Weight (lbs): 180 Respiratory Rate (breaths/min): 19 Body Mass Index (BMI): 23.7 Blood Pressure (mmHg): 139/74 Reference Range: 80 - 120 mg / dl Notes patient stated he did not check his CBG this morning Electronic Signature(s) Signed: 09/09/2019 6:20:13 PM By: Kela Millin Entered By: Kela Millin on 09/09/2019 07:51:40

## 2019-09-09 NOTE — Progress Notes (Signed)
HOBSON, SHOENFELT ([id number]) Visit Report for 09/09/2019 Debridement Details Patient Name: Date of Service: [name], [name] 09/09/2019 8:15 AM Medical Record Number:[id number] Patient Account Number: [id number] Date of Birth/Sex: Treating RN: [birth year] (79 y.o. [name] Primary Care Provider: [name] Other Clinician: Referring Provider: [name] Treating Provider/Extender:Biridiana Twardowski, Esperanza Richters in Treatment: 7 Debridement Performed for Wound #21 Left,Plantar Foot Assessment: Performed By: Physician Ricard Dillon., MD Debridement Type: Debridement Severity of Tissue Pre Fat layer exposed Debridement: Level of Consciousness (Pre- Awake and Alert procedure): Pre-procedure Verification/Time Out Taken: Yes - 08:30 Start Time: 08:31 Pain Control: Lidocaine 4% Topical Solution Total Area Debrided (L x W): 0.5 (cm) x 0.5 (cm) = 0.25 (cm) Tissue and other material Viable, Non-Viable, Callus, Subcutaneous, Skin: Dermis , Fibrin/Exudate debrided: Level: Skin/Subcutaneous Tissue Debridement Description: Excisional Instrument: Curette Bleeding: Minimum Hemostasis Achieved: Pressure End Time: 08:34 Procedural Pain: 0 Post Procedural Pain: 0 Response to Treatment: Procedure was tolerated well Level of Consciousness Awake and Alert (Post-procedure): Post Debridement Measurements of Total Wound Length: (cm) 0.5 Width: (cm) 0.3 Depth: (cm) 0.4 Volume: (cm) 0.047 Character of Wound/Ulcer Post Improved Debridement: Severity of Tissue Post Debridement: Fat layer exposed Post Procedure Diagnosis Same as Pre-procedure Electronic Signature(s) Signed: 09/09/2019 6:03:33 PM By: Linton Ham MD Signed: 09/09/2019 6:22:19 PM By: Deon Pilling Entered By: Linton Ham on 09/09/2019 08:59:57 -------------------------------------------------------------------------------- Debridement Details Patient Name: Date of Service: PATICK, LANGELL 09/09/2019 8:15  AM Medical Record [id number] Patient Account Number: [id number] Date of Birth/Sex: Treating RN: 03/14/1941 (79 y.o. [name] Primary Care Provider: [name] Other Clinician: Referring Provider: [name] Treating Provider/Extender:Azora Bonzo, Esperanza Richters in Treatment: 7 Debridement Performed for Wound #22 Right Metatarsal head first Assessment: Performed By: Physician Ricard Dillon., MD Debridement Type: Debridement Severity of Tissue Pre Fat layer exposed Debridement: Level of Consciousness (Pre- Awake and Alert procedure): Pre-procedure Yes - 08:30 Verification/Time Out Taken: Start Time: 08:31 Pain Control: Lidocaine 4% Topical Solution Total Area Debrided (L x W): 1 (cm) x 1 (cm) = 1 (cm) Tissue and other material Viable, Non-Viable, Callus, Subcutaneous, Skin: Dermis , Fibrin/Exudate debrided: Level: Skin/Subcutaneous Tissue Debridement Description: Excisional Instrument: Curette Bleeding: Minimum Hemostasis Achieved: Pressure End Time: 08:34 Procedural Pain: 0 Post Procedural Pain: 0 Response to Treatment: Procedure was tolerated well Level of Consciousness Awake and Alert (Post-procedure): Post Debridement Measurements of Total Wound Length: (cm) 0.8 Width: (cm) 0.7 Depth: (cm) 0.7 Volume: (cm) 0.308 Character of Wound/Ulcer Post Improved Debridement: Severity of Tissue Post Debridement: Fat layer exposed Post Procedure Diagnosis Same as Pre-procedure Electronic Signature(s) Signed: 09/09/2019 6:03:33 PM By: Linton Ham MD Signed: 09/09/2019 6:22:19 PM By: Deon Pilling Entered By: Linton Ham on 09/09/2019 09:00:09 -------------------------------------------------------------------------------- HPI Details Patient Name: Date of Service: ZAYLYNN, KAMERMAN 09/09/2019 8:15 AM Medical Record [id number] Patient Account Number: [id number] Date of Birth/Sex: Treating RN: [birth year] (79 y.o. [name] Primary Care Provider: [name] Other Clinician: Referring Provider: Treating Provider/Extender:Jemari Hallum, Birdena Crandall, [name] in Treatment: 7 History of Present Illness HPI Description: very pleasant 79 year old gentleman gentleman has type 2 diabetes and has been having an ulcer on his foot for several years. Been seeing as this time around since the end of October. Classified as a Wagner grade 2 because his previous x-rays did not show any problems. He has an MRI pending this Friday. discussing with him he says he has not been able to get his diabetic shoes yet. He continues to smoke his pipe and drinks moonshine and says he will not give this  up. I have tried to counsel him regarding this but he firmly says that he is happy to listen to me but he is not going to give up his habits. 08/17/14 -- patient has no fresh complaints but has recently come with his MRI which was done on 08/12/2014. 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 Readmission: 07/21/2019 upon evaluation today patient presents for initial inspection here in our clinic concerning issues that he has been having with his bilateral feet. He has open wounds that been present at least since the beginning of the year although he does not know an exact time. He is previously had amputations of all toes and the distal portion of his foot. 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 With that being said he does have a history of diabetes, peripheral vascular disease, nicotine dependence, and hypertension. He has not had any recent arterial or vascular studies he was noncompressible today I think he is going to require referral for arterial studies as well. 2/11; patient was readmitted to our clinic last week. He has bilateral plantar foot wounds in the setting of previous transmetatarsal  amputations. He had his arterial studies done on 2/8; these were actually quite good. He was noncompressible on both sides however his waveforms were triphasic. The thought was noncompressible vessels consistent with medial calcification but without significant stenosis in the lower extremities. X-rays were done on the left this showed extensive chronic and postoperative changes without evidence of osteomyelitis. There was no evidence of osteomyelitis on the right foot there was mild vascular calcification MRI was suggested on the left based on clinical correlation. The patient arrives in clinic with some odor and drainage from the left foot. Right foot measures smaller. He is really not offloading these areas although he says he does not walk much 2/25; the patient arrives with some odor and drainage from the left foot again. Post debridement I cultured this area. He has the area on the midfoot roughly the third metatarsal head and the first metatarsal head on the right. He is in surgical shoes bilateral transmet 3/4; culture from last week showed abundant group B strep and a few Proteus. We will put him on cephalexin starting today. Still silver alginate to the wounds 3/11; due to issues with pharmacy he still does not have the cephalexin that I prescribed a week ago. Apparently this was called into a different pharmacy on Friday but they have not delivered it to him. We have been using silver alginate on the wounds but because of lack of moisture change to silver collagen today. We are offloading as best we can in surgical shoes 3/18; he is taking the cephalexin that I prescribed 2 weeks ago. Using silver collagen. He has surgical shoes. Setting of bilateral previous transmetatarsal amputations. Wounds are mirror-image wounds medially. 3/25; mirror-image wounds in the setting of previous transmetatarsal amputation. The wounds are mirror image medial foot wounds. We have been using silver  collagen. We are using surgical shoes offloaded with felt. He came in today asking about his "$900" custom-made shoes although after talking to them it does not appear that he ever really wore these and they are more than a year old Electronic Signature(s) Signed: 09/09/2019 6:03:33 PM By: Linton Ham MD Entered By: Linton Ham on 09/09/2019 09:01:11 -------------------------------------------------------------------------------- Physical Exam Details Patient Name: Date of Service: HEINZ, FAGO 09/09/2019 8:15 AM Medical Record [id number]  Patient Account Number: [id number] Date of Birth/Sex: Treating RN: 03-19-1941 (79 y.o. [name] Primary Care Provider: [name] Other Clinician: Referring Provider: Treating Provider/Extender:Carma Dwiggins, Birdena Crandall, [name] in Treatment: 7 Constitutional Sitting or standing Blood Pressure is within target range for patient.. Pulse regular and within target range for patient.Marland Kitchen Respirations regular, non-labored and within target range.. Temperature is normal and within the target range for the patient.Marland Kitchen Appears in no distress. Notes Wound exam; again both wounds have built up callus especially on the right. There is also raised subcutaneous tissue and undermining. Again I removed all of this. The wound on the right is more substantial than the one on the left both of them however have reasonably small surface areas. Electronic Signature(s) Signed: 09/09/2019 6:03:33 PM By: Linton Ham MD Entered By: Linton Ham on 09/09/2019 09:02:16 -------------------------------------------------------------------------------- Physician Orders Details Patient Name: Date of Service: TAYDEM, LAPP 09/09/2019 8:15 AM Medical Record Number:[id number] Patient Account Number: [id number] Date of Birth/Sex: Treating RN: 1941-03-14 (79 y.o. [name] Primary Care Provider: [name] Other  Clinician: Referring Provider: Treating Provider/Extender:Tyren Dugar, Birdena Crandall, [name] in Treatment: 7 Verbal / Phone Orders: No Diagnosis Coding ICD-10 Coding Code Description E11.621 Type 2 diabetes mellitus with foot ulcer L97.522 Non-pressure chronic ulcer of other part of left foot with fat layer exposed L97.512 Non-pressure chronic ulcer of other part of right foot with fat layer exposed I73.89 Other specified peripheral vascular diseases F17.218 Nicotine dependence, cigarettes, with other nicotine-induced disorders I10 Essential (primary) hypertension L84 Corns and callosities Follow-up Appointments Return Appointment in 2 weeks. - Thursday Dressing Change Frequency Change dressing three times week. Wound Cleansing Clean wound with Wound Cleanser May shower with protection. Primary Wound Dressing Wound #21 Left,Plantar Foot Silver Collagen - with hydrogel Wound #22 Right Metatarsal head first Silver Collagen - with hydrogel Secondary Dressing Wound #21 Left,Plantar Foot Foam - foam donut to offload Kerlix/Rolled Gauze Dry Gauze ABD pad - as needed Wound #22 Right Metatarsal head first Foam - foam donut to offload Kerlix/Rolled Gauze Dry Gauze ABD pad - as needed Edema Control Avoid standing for long periods of time Elevate legs to the level of the heart or above for 30 minutes daily and/or when sitting, a frequency of: - throughout the day. Off-Loading Open toe surgical shoe to: - for bilateral feet. apply felt to both surgical shoes to aid in offloading. Additional Orders / Instructions Other: - ***Patient to bring in diabetic shoes on next wound care encounter.*** [facility] for wound care. - [facility]) Signed: 09/09/2019 6:03:33 PM By: Linton Ham MD Signed: 09/09/2019 6:22:19 PM By: Deon Pilling Entered By: Deon Pilling on 09/09/2019  08:36:46 -------------------------------------------------------------------------------- Problem List Details Patient Name: Date of Service: BREKIN, ASEL 09/09/2019 8:15 AM Medical Record [id number] Patient Account Number: [id number] Date of Birth/Sex: Treating RN: 1940/10/27 (79 y.o. [name] Primary Care Provider: [name] Other Clinician: Referring Provider: Treating Provider/Extender:Brennon Otterness, Birdena Crandall, [name] in Treatment: 7 Active Problems ICD-10 Evaluated Encounter Code Description Active Date Today Diagnosis E11.621 Type 2 diabetes mellitus with foot ulcer 07/21/2019 No Yes L97.522 Non-pressure chronic ulcer of other part of left foot 07/21/2019 No Yes with fat layer exposed L97.512 Non-pressure chronic ulcer of other part of right foot 07/21/2019 No Yes with fat layer exposed I73.89 Other specified peripheral vascular diseases 07/21/2019 No Yes F17.218 Nicotine dependence, cigarettes, with other nicotine- 07/21/2019 No Yes induced disorders I10 Essential (primary) hypertension 07/21/2019 No Yes L84 Corns and  callosities 07/21/2019 No Yes Inactive Problems Resolved Problems Electronic Signature(s) Signed: 09/09/2019 6:03:33 PM By: Linton Ham MD Entered By: Linton Ham on 09/09/2019 08:59:34 -------------------------------------------------------------------------------- Progress Note Details Patient Name: Date of Service: AHYAN, MOSBRUCKER 09/09/2019 8:15 AM Medical Record [id number] Patient Account Number: [id number] Date of Birth/Sex: Treating RN: July 17, 1940 (79 y.o. [name] Primary Care Provider: [name] Other Clinician: Referring Provider: Treating Provider/Extender:Telia Amundson, Birdena Crandall, [name] in Treatment: 7 Subjective History of Present Illness (HPI) very pleasant 79 year old gentleman has type 2 diabetes and has been having an ulcer on his foot for several years. Been  seeing as this time around since the end of October. Classified as a Wagner grade 2 because his previous x- rays did not show any problems. He has an MRI pending this Friday. discussing with him he says he has not been able to get his diabetic shoes yet. He continues to smoke his pipe and drinks moonshine and says he will not give this up. I have tried to counsel him regarding this but he firmly says that he is happy to listen to me but he is not going to give up his habits. 08/17/14 -- patient has no fresh complaints but has recently come with his MRI which was done on 08/12/2014. 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 Readmission: 07/21/2019 upon evaluation today patient presents for initial inspection here in our clinic concerning issues that he has been having with his bilateral feet. He has open wounds that been present at least since the beginning of the year although he does not know an exact time. He is previously had amputations of all toes and the distal portion of his foot. 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 With that being said he does have a history of diabetes, peripheral vascular disease, nicotine dependence, and hypertension. He has not had any recent arterial or vascular studies he was noncompressible today I think he is going to require referral for arterial studies as well. 2/11; patient was readmitted to our clinic last week. He has bilateral plantar foot wounds in the setting of previous transmetatarsal amputations. He had his arterial studies done on 2/8; these were actually quite good. He was noncompressible on both sides however his waveforms were triphasic. The thought was noncompressible vessels consistent with medial calcification but without significant stenosis in the lower extremities. X-rays were done on the left this showed extensive  chronic and postoperative changes without evidence of osteomyelitis. There was no evidence of osteomyelitis on the right foot there was mild vascular calcification MRI was suggested on the left based on clinical correlation. The patient arrives in clinic with some odor and drainage from the left foot. Right foot measures smaller. He is really not offloading these areas although he says he does not walk much 2/25; the patient arrives with some odor and drainage from the left foot again. Post debridement I cultured this area. He has the area on the midfoot roughly the third metatarsal head and the first metatarsal head on the right. He is in surgical shoes bilateral transmet 3/4; culture from last week showed abundant group B strep and a few Proteus. We will put him on cephalexin starting today. Still silver alginate to the wounds 3/11; due to issues with pharmacy he still does not have the cephalexin that I prescribed a week ago. Apparently this was called into a different pharmacy on Friday  but they have not delivered it to him. We have been using silver alginate on the wounds but because of lack of moisture change to silver collagen today. We are offloading as best we can in surgical shoes 3/18; he is taking the cephalexin that I prescribed 2 weeks ago. Using silver collagen. He has surgical shoes. Setting of bilateral previous transmetatarsal amputations. Wounds are mirror-image wounds medially. 3/25; mirror-image wounds in the setting of previous transmetatarsal amputation. The wounds are mirror image medial foot wounds. We have been using silver collagen. We are using surgical shoes offloaded with felt. He came in today asking about his "$900" custom-made shoes although after talking to them it does not appear that he ever really wore these and they are more than a year old Objective Constitutional Sitting or standing Blood Pressure is within target range for patient.. Pulse regular and  within target range for patient.Marland Kitchen Respirations regular, non-labored and within target range.. Temperature is normal and within the target range for the patient.Marland Kitchen Appears in no distress. Vitals Time Taken: 7:50 AM, Height: 73 in, Weight: 180 lbs, BMI: 23.7, Temperature: 97.9 F, Pulse: 65 bpm, Respiratory Rate: 19 breaths/min, Blood Pressure: 139/74 mmHg. General Notes: patient stated he did not check his CBG this morning General Notes: Wound exam; again both wounds have built up callus especially on the right. There is also raised subcutaneous tissue and undermining. Again I removed all of this. The wound on the right is more substantial than the one on the left both of them however have reasonably small surface areas. Integumentary (Hair, Skin) Wound #21 status is Open. Original cause of wound was Gradually Appeared. The wound is located on the [place]. The wound measures 0.3cm length x 0.3cm width x 0.2cm depth; 0.071cm^2 area and 0.014cm^3 volume. There is Fat Layer (Subcutaneous Tissue) Exposed exposed. There is no tunneling noted, however, there is undermining starting at 9:00 and ending at 11:00 with a maximum distance of 0.5cm. There is a medium amount of serosanguineous drainage noted. The wound margin is thickened. There is large (67-100%) red, pink granulation within the wound bed. There is no necrotic tissue within the wound bed. Wound #22 status is Open. Original cause of wound was Gradually Appeared. The wound is located on the Right Metatarsal head first. The wound measures 0.8cm length x 0.7cm width x 0.7cm depth; 0.44cm^2 area and 0.308cm^3 volume. There is Fat Layer (Subcutaneous Tissue) Exposed exposed. There is no tunneling noted, however, there is undermining starting at 10:00 and ending at 4:00 with a maximum distance of 0.4cm. There is a medium amount of serosanguineous drainage noted. Foul odor after cleansing was noted. The wound margin is thickened. There is  large (67-100%) red, pink granulation within the wound bed. There is no necrotic tissue within the wound bed. Assessment Active Problems ICD-10 Type 2 diabetes mellitus with foot ulcer Non-pressure chronic ulcer of other part of left foot with fat layer exposed Non-pressure chronic ulcer of other part of right foot with fat layer exposed Other specified peripheral vascular diseases Nicotine dependence, cigarettes, with other nicotine-induced disorders Essential (primary) hypertension Corns and callosities Procedures Wound #21 Pre-procedure diagnosis of Wound #21 is a Diabetic Wound/Ulcer of the Lower Extremity located on the [place] .Severity of Tissue Pre Debridement is: Fat layer exposed. There was a Excisional Skin/Subcutaneous Tissue Debridement with a total area of 0.25 sq cm performed by Ricard Dillon., MD. With the following instrument(s): Curette to remove Viable and Non-Viable tissue/material. Material removed includes  Callus, Subcutaneous Tissue, Skin: Dermis, and Fibrin/Exudate after achieving pain control using Lidocaine 4% Topical Solution. A time out was conducted at 08:30, prior to the start of the procedure. A Minimum amount of bleeding was controlled with Pressure. The procedure was tolerated well with a pain level of 0 throughout and a pain level of 0 following the procedure. Post Debridement Measurements: 0.5cm length x 0.3cm width x 0.4cm depth; 0.047cm^3 volume. Character of Wound/Ulcer Post Debridement is improved. Severity of Tissue Post Debridement is: Fat layer exposed. 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 There was a Excisional Skin/Subcutaneous Tissue Debridement with a total area of 1 sq cm performed by Ricard Dillon., MD.  With the following instrument(s): Curette to remove Viable and Non-Viable tissue/material. Material removed includes Callus, Subcutaneous Tissue, Skin: Dermis, and Fibrin/Exudate after achieving pain control using Lidocaine 4% Topical Solution. A time out was conducted at 08:30, prior to the start of the procedure. A Minimum amount of bleeding was controlled with Pressure. The procedure was tolerated well with a pain level of 0 throughout and a pain level of 0 following the procedure. Post Debridement Measurements: 0.8cm length x 0.7cm width x 0.7cm depth; 0.308cm^3 volume. Character of Wound/Ulcer Post Debridement is improved. Severity of Tissue Post Debridement is: Fat layer exposed. Post procedure Diagnosis Wound #22: Same as Pre-Procedure Plan Follow-up Appointments: Return Appointment in 2 weeks. - Thursday Dressing Change Frequency: Change dressing three times week. Wound Cleansing: Clean wound with Wound Cleanser May shower with protection. Primary Wound Dressing: Wound #21 Left,Plantar Foot: Silver Collagen - with hydrogel Wound #22 Right Metatarsal head first: Silver Collagen - with hydrogel Secondary Dressing: Wound #21 Left,Plantar Foot: Foam - foam donut to offload Kerlix/Rolled Gauze Dry Gauze ABD pad - as needed Wound #22 Right Metatarsal head first: Foam - foam donut to offload Kerlix/Rolled Gauze Dry Gauze ABD pad - as needed Edema Control: Avoid standing for long periods of time Elevate legs to the level of the heart or above for 30 minutes daily and/or when sitting, a frequency of: - throughout the day. Off-Loading: Open toe surgical shoe to: - for bilateral feet. apply felt to both surgical shoes to aid in offloading. Additional Orders / Instructions: Other: - ***Patient to bring in diabetic shoes on next wound care encounter.*** Home Health: [facility] for wound care. - [facility] 1. I am continuing with silver collagen 2. Kerlix  and rolled gauze 3. He has home health changing the dressing 4. I do not know that we are adequately offloading this. He walks with a support cane. I do not think he can handle a total contact cast. 5. With regards to his shoes, I told him to bring this in next time and would look at them. I am doubtful that they were created for wounds in this area however Electronic Signature(s) Signed: 09/09/2019 6:03:33 PM By: Linton Ham MD Entered By: Linton Ham on 09/09/2019 09:03:40 -------------------------------------------------------------------------------- SuperBill Details Patient Name: Date of Service: HARLEE, VILCHEZ 09/09/2019 Medical Record Number:[id number] Patient Account Number: [id number] Date of Birth/Sex: Treating RN: Feb 22, 1941 (79 y.o. 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, cigarettes, with other nicotine-induced disorders I10 Essential (primary) hypertension L84 Corns and callosities Facility Procedures CPT4 Code Description: [id number] - DEB SUBQ TISSUE 20 SQ CM/< ICD-10 Diagnosis Description 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

## 2019-09-23 ENCOUNTER — Other Ambulatory Visit (HOSPITAL_COMMUNITY)
Admission: RE | Admit: 2019-09-23 | Discharge: 2019-09-23 | Disposition: A | Discharge status: Home / Self Care | Payer: Medicare Other | Source: Other Acute Inpatient Hospital | Attending: Internal Medicine | Admitting: Internal Medicine

## 2019-09-23 ENCOUNTER — Other Ambulatory Visit: Payer: Self-pay

## 2019-09-23 ENCOUNTER — Encounter (HOSPITAL_BASED_OUTPATIENT_CLINIC_OR_DEPARTMENT_OTHER): Payer: Medicare Other | Attending: Internal Medicine | Admitting: Internal Medicine

## 2019-09-23 DIAGNOSIS — F17218 Nicotine dependence, cigarettes, with other nicotine-induced disorders: Secondary | ICD-10-CM | POA: Diagnosis not present

## 2019-09-23 DIAGNOSIS — E11621 Type 2 diabetes mellitus with foot ulcer: Secondary | ICD-10-CM | POA: Insufficient documentation

## 2019-09-23 DIAGNOSIS — L84 Corns and callosities: Secondary | ICD-10-CM | POA: Diagnosis not present

## 2019-09-23 DIAGNOSIS — L97512 Non-pressure chronic ulcer of other part of right foot with fat layer exposed: Secondary | ICD-10-CM | POA: Insufficient documentation

## 2019-09-23 DIAGNOSIS — L97522 Non-pressure chronic ulcer of other part of left foot with fat layer exposed: Secondary | ICD-10-CM | POA: Insufficient documentation

## 2019-09-23 DIAGNOSIS — I7389 Other specified peripheral vascular diseases: Secondary | ICD-10-CM | POA: Diagnosis not present

## 2019-09-23 NOTE — Progress Notes (Signed)
BRADFORD, PETROSINO ([id number]) Visit Report for 09/23/2019 Debridement Details Patient Name: Date of Service: [name], [name] 09/23/2019 8:15 AM Medical Record Number:[id number] Patient Account Number: [id number] Date of Birth/Sex: Treating RN: 11/29/1940 (79 y.o. [name] Primary Care Provider: [name] Other Clinician: Referring Provider: Treating Provider/Extender:Ahriana Gunkel, Birdena Crandall, [name] in Treatment: 9 Debridement Performed for Wound #21 Left,Plantar Foot Assessment: Performed By: Physician Ricard Dillon., MD Debridement Type: Debridement Severity of Tissue Pre Fat layer exposed Debridement: Level of Consciousness (Pre- Awake and Alert procedure): Pre-procedure Verification/Time Out Taken: Yes - 08:48 Start Time: 08:49 Pain Control: Lidocaine 4% Topical Solution Total Area Debrided (L x W): 3 (cm) x 2 (cm) = 6 (cm) Tissue and other material Viable, Non-Viable, Callus, Subcutaneous, Skin: Dermis , Fibrin/Exudate debrided: Level: Skin/Subcutaneous Tissue Debridement Description: Excisional Instrument: Blade Bleeding: Moderate Hemostasis Achieved: Pressure End Time: 08:53 Procedural Pain: 0 Post Procedural Pain: 0 Response to Treatment: Procedure was tolerated well Level of Consciousness Awake and Alert (Post-procedure): Post Debridement Measurements of Total Wound Length: (cm) 3 Width: (cm) 2 Depth: (cm) 0.2 Volume: (cm) 0.942 Character of Wound/Ulcer Post Improved Debridement: Severity of Tissue Post Debridement: Fat layer exposed Post Procedure Diagnosis Same as Pre-procedure Electronic Signature(s) Signed: 09/23/2019 5:58:56 PM By: Linton Ham MD Signed: 09/23/2019 6:08:37 PM By: Deon Pilling Entered By: Linton Ham on 09/23/2019 09:19:12 -------------------------------------------------------------------------------- Debridement Details Patient Name: Date of Service: [name], [name] 09/23/2019 8:15 AM Medical  Record [id number] Patient Account Number: [id number] Date of Birth/Sex: Treating RN: 17-May-1941 (79 y.o. [name] Primary Care Provider: [name] Other Clinician: Referring Provider: Treating Provider/Extender:Hisham Provence, Birdena Crandall, [name] in Treatment: 9 Debridement Performed for Wound #22 Right Metatarsal head first Assessment: Performed By: Physician Ricard Dillon., MD Debridement Type: Debridement Severity of Tissue Pre Fat layer exposed Debridement: Level of Consciousness (Pre- Awake and Alert procedure): Pre-procedure Yes - 08:48 Verification/Time Out Taken: Start Time: 08:49 Pain Control: Lidocaine 4% Topical Solution Total Area Debrided (L x W): 2 (cm) x 2 (cm) = 4 (cm) Tissue and other material Viable, Non-Viable, Callus, Subcutaneous, Skin: Dermis , Fibrin/Exudate debrided: Level: Skin/Subcutaneous Tissue Debridement Description: Excisional Instrument: Blade Bleeding: Moderate Hemostasis Achieved: Pressure End Time: 08:53 Procedural Pain: 0 Post Procedural Pain: 0 Response to Treatment: Procedure was tolerated well Level of Consciousness Awake and Alert (Post-procedure): Post Debridement Measurements of Total Wound Length: (cm) 0.7 Width: (cm) 0.8 Depth: (cm) 0.5 Volume: (cm) 0.22 Character of Wound/Ulcer Post Improved Debridement: Severity of Tissue Post Debridement: Fat layer exposed Post Procedure Diagnosis Same as Pre-procedure Electronic Signature(s) Signed: 09/23/2019 5:58:56 PM By: Linton Ham MD Signed: 09/23/2019 6:08:37 PM By: Deon Pilling Entered By: Linton Ham on 09/23/2019 09:19:19 -------------------------------------------------------------------------------- HPI Details Patient Name: Date of Service: [name], [name] 09/23/2019 8:15 AM Medical Record [id number] Patient Account Number: [id number] Date of Birth/Sex: Treating RN: 1941/03/07 (79 y.o. [name] Primary Care  Provider: [name] Other Clinician: Referring Provider: Treating Provider/Extender:Rojean Ige, Birdena Crandall, [name] in Treatment: 9 History of Present Illness HPI Description: very pleasant 79 year old gentleman has type 2 diabetes and has been having an ulcer on his foot for several years. Been seeing as this time around since the end of October. Classified as a Wagner grade 2 because his previous x-rays did not show any problems. He has an MRI pending this Friday. discussing with him he says he has not been able to get his diabetic shoes yet. He continues to smoke his pipe and drinks moonshine and says he will not give this  up. I have tried to counsel him regarding this but he firmly says that he is happy to listen to me but he is not going to give up his habits. 08/17/14 -- patient has no fresh complaints but has recently come with his MRI which was done on 08/12/2014. 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 Readmission: 07/21/2019 upon evaluation today patient presents for initial inspection here in our clinic concerning issues that he has been having with his bilateral feet. He has open wounds that been present at least since the beginning of the year although he does not know an exact time. He is previously had amputations of all toes and the distal portion of his foot. 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 With that being said he does have a history of diabetes, peripheral vascular disease, nicotine dependence, and hypertension. He has not had any recent arterial or vascular studies he was noncompressible today I think he is going to require referral for arterial studies as well. 2/11; patient was readmitted to our clinic last week. He has bilateral plantar foot wounds in the setting of previous transmetatarsal amputations. He had  his arterial studies done on 2/8; these were actually quite good. He was noncompressible on both sides however his waveforms were triphasic. The thought was noncompressible vessels consistent with medial calcification but without significant stenosis in the lower extremities. X-rays were done on the left this showed extensive chronic and postoperative changes without evidence of osteomyelitis. There was no evidence of osteomyelitis on the right foot there was mild vascular calcification MRI was suggested on the left based on clinical correlation. The patient arrives in clinic with some odor and drainage from the left foot. Right foot measures smaller. He is really not offloading these areas although he says he does not walk much 2/25; the patient arrives with some odor and drainage from the left foot again. Post debridement I cultured this area. He has the area on the midfoot roughly the third metatarsal head and the first metatarsal head on the right. He is in surgical shoes bilateral transmet 3/4; culture from last week showed abundant group B strep and a few Proteus. We will put him on cephalexin starting today. Still silver alginate to the wounds 3/11; due to issues with pharmacy he still does not have the cephalexin that I prescribed a week ago. Apparently this was called into a different pharmacy on Friday but they have not delivered it to him. We have been using silver alginate on the wounds but because of lack of moisture change to silver collagen today. We are offloading as best we can in surgical shoes 3/18; he is taking the cephalexin that I prescribed 2 weeks ago. Using silver collagen. He has surgical shoes. Setting of bilateral previous transmetatarsal amputations. Wounds are mirror-image wounds medially. 3/25; mirror-image wounds in the setting of previous transmetatarsal amputation. The wounds are mirror image medial foot wounds. We have been using silver collagen. We are using  surgical shoes offloaded with felt. He came in today asking about his "$900" custom-made shoes although after talking to them it does not appear that he ever really wore these and they are more than a year old 4/8; mirror-image wounds on the medial part of the plantar feet in the setting of previous transmetatarsal amputations. We have been using silver collagen. I aggressively debrided these 2 weeks ago removing callus subcutaneous  tissue from around the wound margins. He did indeed bring echo he has custom-made shoes with transmetatarsal inserts. He has never worn these. 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 [name] Primary Care Provider: [name] Other Clinician: Referring Provider: Treating Provider/Extender:Delancey Moraes, Birdena Crandall, [name] in Treatment: 9 Constitutional Sitting or standing Blood Pressure is within target range for patient.. Pulse regular and within target range for patient.Marland Kitchen Respirations regular, non-labored and within target range.. Temperature is normal and within the target range for the patient.Marland Kitchen Appears in no distress. Cardiovascular Pedal pulses are palpable. Notes Wound exam; again both wounds have thick built up callus and subcutaneous tissue. I remove this aggressively with a #10 scalpel. Hemostasis with silver nitrate bilaterally. There was a significant odor on the right I cultured this but no empiric antibiotics. Electronic Signature(s) Signed: 09/23/2019 5:58:56 PM By: Linton Ham MD Entered  By: Linton Ham on 09/23/2019 09:21:25 -------------------------------------------------------------------------------- Physician Orders Details Patient Name: Date of Service: [name], [name] 09/23/2019 8:15 AM Medical Record Number:[id number] Patient Account Number: [id number] Date of Birth/Sex: Treating RN: [birth year] (79 y.o. [name] Primary Care Provider: [name] Other Clinician: Referring Provider: Treating Provider/Extender:Javen Hinderliter, Birdena Crandall, [name] in Treatment: 9 Verbal / Phone Orders: No Diagnosis Coding ICD-10 Coding Code Description E11.621 Type 2 diabetes mellitus with foot ulcer L97.522 Non-pressure chronic ulcer of other part of left foot with fat layer exposed L97.512 Non-pressure chronic ulcer of other part of right foot with fat layer exposed I73.89 Other specified peripheral vascular diseases F17.218 Nicotine dependence, cigarettes, with other nicotine-induced disorders I10 Essential (primary) hypertension L84 Corns and callosities Follow-up Appointments Return Appointment in 1 week. - Thursday Dressing Change Frequency Change dressing three times week. Wound Cleansing Clean wound with Wound Cleanser May shower with protection. Primary Wound Dressing Wound #21 Left,Plantar Foot Calcium Alginate with Silver Wound #22 Right Metatarsal head first Calcium Alginate with Silver Secondary Dressing Wound #21 Left,Plantar Foot Foam - foam donut to offload Kerlix/Rolled Gauze Dry Gauze Wound #22 Right Metatarsal head first Foam - foam donut to offload Kerlix/Rolled Gauze Dry Gauze Edema Control Avoid standing for long periods of time Elevate legs to the level of the heart or above for 30 minutes daily and/or when sitting, a frequency of: - throughout the day. Off-Loading Other: - felt patient's personal shoes. patient to wear diabetic shoes. patient to limit about of time spent walking and standing. [facility] skilled nursing for wound care. - [facility] Bacteria identified in Unspecified specimen by Aerobe culture (MICRO) - someone will call you with any abnormal results. LOINC Code: [id number] Convenience Name: Areobic culture-specimen not specified Electronic Signature(s) Signed: 09/23/2019 5:58:56 PM By: Linton Ham MD Signed: 09/23/2019 6:08:37 PM By: Deon Pilling Entered By: Deon Pilling on 09/23/2019 09:03:07 -------------------------------------------------------------------------------- Problem List Details Patient Name: Date of Service: [name], [name] 09/23/2019 8:15 AM Medical Record [id number] Patient Account Number: [id number] Date of Birth/Sex: Treating RN: [birth year] (79 y.o. [name] Primary Care Provider: [name] Other Clinician: Referring Provider: Treating Provider/Extender:Lakindra Wible, Birdena Crandall, [name] in Treatment: 9 Active Problems ICD-10 Evaluated Encounter Code Description Active Date Today Diagnosis E11.621 Type 2 diabetes mellitus with foot ulcer 07/21/2019  No Yes L97.522 Non-pressure chronic ulcer of other part of left foot 07/21/2019 No Yes with fat layer exposed L97.512 Non-pressure chronic ulcer of other part of right foot 07/21/2019 No Yes with fat layer exposed I73.89 Other specified peripheral vascular diseases 07/21/2019 No Yes F17.218 Nicotine dependence, cigarettes, with other nicotine- 07/21/2019 No Yes induced disorders I10 Essential (primary) hypertension 07/21/2019 No Yes L84 Corns and callosities 07/21/2019 No Yes Inactive Problems Resolved Problems Electronic Signature(s) Signed: 09/23/2019 5:58:56 PM By: Linton Ham MD Entered By: Linton Ham on 09/23/2019 09:18:46 -------------------------------------------------------------------------------- Progress Note Details Patient Name: Date of Service: [name], [name] 09/23/2019 8:15 AM Medical Record [id number] Patient  Account Number: [id number] Date of Birth/Sex: Treating RN: October 04, 1940 (80 y.o. [name] Primary Care Provider: [name] Other Clinician: Referring Provider: Treating Provider/Extender:Karine Garn, Birdena Crandall, [name] in Treatment: 9 Subjective History of Present Illness (HPI) very pleasant 79 year old gentleman has type 2 diabetes and has been having an ulcer on his foot for several years. Been seeing as this time around since the end of October. Classified as a Wagner grade 2 because his previous x- rays did not show any problems. He has an MRI pending this Friday. discussing with him he says he has not been able to get his diabetic shoes yet. He continues to smoke his pipe and drinks moonshine and says he will not give this up. I have tried to counsel him regarding this but he firmly says that he is happy to listen to me but he is not going to give up his habits. 08/17/14 -- patient has no fresh complaints but has recently come with his MRI which was done on 08/12/2014. 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 Readmission: 07/21/2019 upon evaluation today patient presents for initial inspection here in our clinic concerning issues that he has been having with his bilateral feet. He has open wounds that been present at least since the beginning of the year although he does not know an exact time. He is previously had amputations of all toes and the distal portion of his foot. 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 With that being said he does have a history of diabetes, peripheral vascular disease, nicotine dependence, and hypertension. He has not had any recent arterial or vascular studies he was noncompressible today I think he is going to require referral for arterial studies as well. 2/11; patient was readmitted to our clinic  last week. He has bilateral plantar foot wounds in the setting of previous transmetatarsal amputations. He had his arterial studies done on 2/8; these were actually quite good. He was noncompressible on both sides however his waveforms were triphasic. The thought was noncompressible vessels consistent with medial calcification but without significant stenosis in the lower extremities. X-rays were done on the left this showed extensive chronic and postoperative changes without evidence of osteomyelitis. There was no evidence of osteomyelitis on the right foot there was mild vascular calcification MRI was suggested on the left based on clinical correlation. The patient arrives in clinic with some odor and drainage from the left foot. Right foot measures smaller. He is really not offloading these areas although he says he does not walk much 2/25; the patient arrives with some odor and drainage from the left foot again. Post debridement I cultured this area. He has the area on the midfoot roughly the third metatarsal head and the first metatarsal head  on the right. He is in surgical shoes bilateral transmet 3/4; culture from last week showed abundant group B strep and a few Proteus. We will put him on cephalexin starting today. Still silver alginate to the wounds 3/11; due to issues with pharmacy he still does not have the cephalexin that I prescribed a week ago. Apparently this was called into a different pharmacy on Friday but they have not delivered it to him. We have been using silver alginate on the wounds but because of lack of moisture change to silver collagen today. We are offloading as best we can in surgical shoes 3/18; he is taking the cephalexin that I prescribed 2 weeks ago. Using silver collagen. He has surgical shoes. Setting of bilateral previous transmetatarsal amputations. Wounds are mirror-image wounds medially. 3/25; mirror-image wounds in the setting of previous transmetatarsal  amputation. The wounds are mirror image medial foot wounds. We have been using silver collagen. We are using surgical shoes offloaded with felt. He came in today asking about his "$900" custom-made shoes although after talking to them it does not appear that he ever really wore these and they are more than a year old 4/8; mirror-image wounds on the medial part of the plantar feet in the setting of previous transmetatarsal amputations. We have been using silver collagen. I aggressively debrided these 2 weeks ago removing callus subcutaneous tissue from around the wound margins. He did indeed bring echo he has custom-made shoes with transmetatarsal inserts. He has never worn these. He claims to be not spending a lot of time on his feet Objective Constitutional Sitting or standing Blood Pressure is within target range for patient.. Pulse regular and within target range for patient.Marland Kitchen Respirations regular, non-labored and within target range.. Temperature is normal and within the target range for the patient.Marland Kitchen Appears in no distress. Vitals Time Taken: 8:24 AM, Height: 73 in, Weight: 180 lbs, BMI: 23.7, Temperature: 98.1 F, Pulse: 68 bpm, Respiratory Rate: 19 breaths/min, Blood Pressure: 129/64 mmHg. General Notes: patient stated he did not check his CBG this morning Cardiovascular Pedal pulses are palpable. General Notes: Wound exam; again both wounds have thick built up callus and subcutaneous tissue. I remove this aggressively with a #10 scalpel. Hemostasis with silver nitrate bilaterally. There was a significant odor on the right I cultured this but no empiric antibiotics. Integumentary (Hair, Skin) Wound #21 status is Open. Original cause of wound was Gradually Appeared. The wound is located on the [place]. The wound measures 0.4cm length x 0.3cm width x 0.2cm depth; 0.094cm^2 area and 0.019cm^3 volume. There is Fat Layer (Subcutaneous Tissue) Exposed exposed. There is no  tunneling noted, however, there is undermining starting at 6:00 and ending at 10:00 with a maximum distance of 0.5cm. There is a medium amount of serous drainage noted. The wound margin is thickened. There is large (67-100%) red, pink granulation within the wound bed. There is no necrotic tissue within the wound bed. Wound #22 status is Open. Original cause of wound was Gradually Appeared. The wound is located on the Right Metatarsal head first. The wound measures 0.7cm length x 0.8cm width x 0.5cm depth; 0.44cm^2 area and 0.22cm^3 volume. There is Fat Layer (Subcutaneous Tissue) Exposed exposed. There is no tunneling noted, however, there is undermining starting at 3:00 and ending at 6:00 with a maximum distance of 0.6cm. There is a medium amount of serous drainage noted. Foul odor after cleansing was noted. The wound margin is thickened. There is large (67-100%) red, pink granulation  within the wound bed. There is no necrotic tissue within the wound bed. Assessment Active Problems ICD-10 Type 2 diabetes mellitus with foot ulcer Non-pressure chronic ulcer of other part of left foot with fat layer exposed Non-pressure chronic ulcer of other part of right foot with fat layer exposed Other specified peripheral vascular diseases Nicotine dependence, cigarettes, with other nicotine-induced disorders Essential (primary) hypertension Corns and callosities Procedures Wound #21 Pre-procedure diagnosis of Wound #21 is a Diabetic Wound/Ulcer of the Lower Extremity located on the [place] .Severity of Tissue Pre Debridement is: Fat layer exposed. There was a Excisional Skin/Subcutaneous Tissue Debridement with a total area of 6 sq cm performed by Ricard Dillon., MD. With the following instrument(s): Blade to remove Viable and Non-Viable tissue/material. Material removed includes Callus, Subcutaneous Tissue, Skin: Dermis, and Fibrin/Exudate after achieving pain control using Lidocaine  4% Topical Solution. A time out was conducted at 08:48, prior to the start of the procedure. A Moderate amount of bleeding was controlled with Pressure. The procedure was tolerated well with a pain level of 0 throughout and a pain level of 0 following the procedure. Post Debridement Measurements: 3cm length x 2cm width x 0.2cm depth; 0.942cm^3 volume. Character of Wound/Ulcer Post Debridement is improved. Severity of Tissue Post Debridement is: Fat layer exposed. 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 There was a Excisional Skin/Subcutaneous Tissue Debridement with a total area of 4 sq cm performed by Ricard Dillon., MD. With the following instrument(s): Blade to remove Viable and Non-Viable tissue/material. Material removed includes Callus, Subcutaneous Tissue, Skin: Dermis, and Fibrin/Exudate after achieving pain control using Lidocaine 4% Topical Solution. A time out was conducted at 08:48, prior to the start of the procedure. A Moderate amount of bleeding was controlled with Pressure. The procedure was tolerated well with a pain level of 0 throughout and a pain level of 0 following the procedure. Post Debridement Measurements: 0.7cm length x 0.8cm width x 0.5cm depth; 0.22cm^3 volume. Character of Wound/Ulcer Post Debridement is improved. Severity of Tissue Post Debridement is: Fat layer exposed. Post procedure Diagnosis Wound #22: Same as Pre-Procedure Plan Follow-up Appointments: Return Appointment in 1 week. - Thursday Dressing Change Frequency: Change dressing three times week. Wound Cleansing: Clean wound with Wound Cleanser May shower with protection. Primary Wound Dressing: Wound #21 Left,Plantar Foot: Calcium Alginate with Silver Wound #22 Right Metatarsal  head first: Calcium Alginate with Silver Secondary Dressing: Wound #21 Left,Plantar Foot: Foam - foam donut to offload Kerlix/Rolled Gauze Dry Gauze Wound #22 Right Metatarsal head first: Foam - foam donut to offload Kerlix/Rolled Gauze Dry Gauze Edema Control: Avoid standing for long periods of time Elevate legs to the level of the heart or above for 30 minutes daily and/or when sitting, a frequency of: - throughout the day. Off-Loading: Other: - felt patient's personal shoes. patient to wear diabetic shoes. patient to limit about of time spent walking and standing. Home Health: [facility] skilled nursing for wound care. - [facility] ordered were: Areobic culture-specimen not specified right foot wound. - someone will call you with any abnormal results. 1. I did a culture of the right foot. This was not overtly infected and the odor may have been because of open wound under nonviable tissue/undermining. This was a deep tissue culture. No empiric antibiotics 2.  I change the primary dressing to silver alginate 3. We will try to offload his feet in the custom-made shoes he already on. Although these do not appear optimal for his type of wound for offloading we are certainly not making progress with his surgical shoes. Electronic Signature(s) Signed: 09/23/2019 5:58:56 PM By: Linton Ham MD Entered By: Linton Ham on 09/23/2019 09:22:51 -------------------------------------------------------------------------------- SuperBill Details Patient Name: Date of Service: [name], [name] 09/23/2019 Medical Record Number:[id number] Patient Account Number: [id number] Date of Birth/Sex: Treating RN: [birth year] (79 y.o. 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 with fat layer exposed I73.89 Other specified peripheral vascular diseases F17.218 Nicotine dependence, cigarettes, with other nicotine-induced disorders I10 Essential (primary) hypertension L84 Corns and callosities Facility Procedures CPT4 Code Description: [id number] - DEB SUBQ TISSUE 20 SQ CM/< ICD-10 Diagnosis Description L97.522 Non-pressure chronic ulcer of other part of left foot with L97.512 Non-pressure chronic ulcer of other part of right foot with Modifier: fat layer ex fat layer e Quantity: 1 posed xposed Physician Procedures CPT4 Code Description: [id number] - WC PHYS SUBQ TISS 20 SQ CM ICD-10 Diagnosis Description 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

## 2019-09-24 NOTE — Progress Notes (Signed)
[name], [name] ([id number]) Visit Report for 09/23/2019 Arrival Information Details Patient Name: Date of Service: [name], [name] 09/23/2019 8:15 AM Medical Record Number:[id number] Patient Account Number: [id number] Date of Birth/Sex: Treating RN: 13-Aug-1940 (79 y.o. [name] Primary Care Solene Hereford: [name] Other Clinician: Referring Ranyah Groeneveld: Treating Teneisha Gignac/Extender:Robson, Birdena Crandall, [name] in Treatment: 9 Visit Information History Since Last Visit Added or deleted any No Patient Arrived: Cane medications: Arrival Time: 08:23 Any new allergies or adverse No Accompanied By: self reactions: Transfer Assistance: None Had a fall or experienced change No Patient Identification Verified: Yes in Secondary Verification Process Yes activities of daily living that may Completed: affect Patient Requires Transmission- No risk of falls: Based Precautions: Signs or symptoms of No Patient Has Alerts: Yes abuse/neglect since last visito Patient Alerts: R ABI non Hospitalized since last visit: No compressible Implantable device outside of the No L ABI non clinic excluding compressible cellular tissue based products placed in the center since last visit: Has Dressing in Place as Yes Prescribed: Has Footwear/Offloading in Place Yes as Prescribed: Left: Surgical Shoe with Pressure Relief Insole Right: Surgical Shoe with Pressure Relief Insole Pain Present Now: No Electronic Signature(s) Signed: 09/23/2019 5:43:40 PM By: Kela Millin Entered By: Kela Millin on 09/23/2019 08:24:11 -------------------------------------------------------------------------------- Encounter Discharge Information Details Patient Name: Date of Service: [name], [name] 09/23/2019 8:15 AM Medical Record [id number] Patient Account Number: [id number] Date of Birth/Sex: Treating RN: 1940/09/09 (79 y.o. [name] Primary Care Bonnita Newby:  [name] Other Clinician: Referring Heidemarie Goodnow: Treating Izumi Mixon/Extender:Robson, Birdena Crandall, [name] in Treatment: 9 Encounter Discharge Information Items Post Procedure Vitals Discharge Condition: Stable Temperature (F): 98.1 Ambulatory Status: Cane Pulse (bpm): 68 Discharge Destination: Home Respiratory Rate (breaths/min): 18 Transportation: Private Auto Blood Pressure (mmHg): 129/64 Accompanied By: self Schedule Follow-up Appointment: Yes Clinical Summary of Care: Patient Declined Electronic Signature(s) Signed: 09/24/2019 6:10:03 PM By: Baruch Gouty RN, BSN Entered By: Baruch Gouty on 09/23/2019 09:25:56 -------------------------------------------------------------------------------- Lower Extremity Assessment Details Patient Name: Date of Service: [name], [name] 09/23/2019 8:15 AM Medical Record Number:[id number] Patient Account Number: [id number] Date of Birth/Sex: Treating RN: Oct 29, 1940 (78 y.o. [name] Primary Care Zelig Gacek: [name] Other Clinician: Referring Quamere Mussell: Treating Darwin Guastella/Extender:Robson, Birdena Crandall, [name] in Treatment: 9 Edema Assessment Assessed: [Left: No] [Right: No] Edema: [Left: No] [Right: No] Calf Left: Right: Point of Measurement: 32 cm From Medial Instep 28 cm 35 cm Ankle Left: Right: Point of Measurement: 10 cm From Medial Instep 23.5 cm 25.5 cm Vascular Assessment Pulses: Dorsalis Pedis Palpable: [Left:Yes] [Right:Yes] Electronic Signature(s) Signed: 09/23/2019 5:43:40 PM By: Kela Millin Entered By: Kela Millin on 09/23/2019 08:32:10 -------------------------------------------------------------------------------- Multi Wound Chart Details Patient Name: Date of Service: [name], [name] 09/23/2019 8:15 AM Medical Record [id number] Patient Account Number: [id number] Date of Birth/Sex: Treating RN: [birth year] (79 y.o. [name] Primary  Care Harkirat Orozco: [name] Other Clinician: Referring [place] Kielbasa: Treating Aariya Ferrick/Extender:Robson, Birdena Crandall, [name] in Treatment: 9 Vital Signs Height(in): 73 Pulse(bpm): 59 Weight(lbs): 180 Blood Pressure(mmHg): 129/64 Body Mass Index(BMI): 24 Temperature(F): 98.1 Respiratory 19 Rate(breaths/min): Photos: [21:No Photos] [22:No Photos] [N/A:N/A] Wound Location: [21:Left, Plantar Foot] [22:Right Metatarsal head first N/A] Wounding Event: [21:Gradually Appeared] [22:Gradually Appeared] [N/A:N/A] Primary Etiology: [21:Diabetic Wound/Ulcer of the Diabetic Wound/Ulcer of the N/A Lower Extremity] [22:Lower Extremity] Comorbid History: [21:Cataracts, Glaucoma, Hypertension, Peripheral Hypertension, Peripheral Arterial Disease, Type II Diabetes, Osteomyelitis, Diabetes, Osteomyelitis, Neuropathy] [22:Cataracts, Glaucoma, Arterial Disease, Type II Neuropathy] [N/A:N/A] Date Acquired: [21:06/17/2018] [22:06/17/2018] [N/A:N/A] Weeks of Treatment: [21:9] [22:9] [N/A:N/A] Wound Status: [21:Open] [22:Open] [N/A:N/A] Measurements  L x W x D 0.4x0.3x0.2 [22:0.7x0.8x0.5] [N/A:N/A] (cm) Area (cm) : [21:0.094] [22:0.44] [N/A:N/A] Volume (cm) : [30:0.923] [22:0.22] [N/A:N/A] % Reduction in Area: [21:95.90%] [22:29.90%] [N/A:N/A] % Reduction in Volume: 98.40% [22:-17.00%] [N/A:N/A] Starting Position 1 [21:6] [22:3] (o'clock): Ending Position 1 [21:10] [22:6] (o'clock): Maximum Distance 1 [21:0.5] [22:0.6] (cm): Undermining: [21:Yes] [22:Yes] [N/A:N/A] Classification: [21:Grade 2] [22:Grade 2] [N/A:N/A] Exudate Amount: [21:Medium] [22:Medium] [N/A:N/A] Exudate Type: [21:Serous] [22:Serous] [N/A:N/A] Exudate Color: [21:amber] [22:amber] [N/A:N/A] Foul Odor After Cleansing:No [22:Yes] [N/A:N/A] Odor Anticipated Due to N/A [22:No] [N/A:N/A] Product Use: Wound Margin: [21:Thickened] [22:Thickened] [N/A:N/A] Granulation Amount: [21:Large (67-100%)] [22:Large (67-100%)]  [N/A:N/A] Granulation Quality: [21:Red, Pink] [22:Red, Pink] [N/A:N/A] Necrotic Amount: [21:None Present (0%)] [22:None Present (0%)] [N/A:N/A] Exposed Structures: [21:Fat Layer (Subcutaneous Tissue) Exposed: Yes Fascia: No Tendon: No Muscle: No Joint: No Bone: No] [22:Fat Layer (Subcutaneous Tissue) Exposed: Yes Fascia: No Tendon: No Muscle: No Joint: No Bone: No] [N/A:N/A] Epithelialization: [21:None] [22:None] [N/A:N/A] Debridement: [21:Debridement - Excisional] [22:Debridement - Excisional] [N/A:N/A] Pre-procedure [21:08:48] [22:08:48] [N/A:N/A] Verification/Time Out Taken: Pain Control: [21:Lidocaine 4% Topical Solution] [22:Lidocaine 4% Topical Solution] [N/A:N/A] Tissue Debrided: [21:Callus, Subcutaneous] [22:Callus, Subcutaneous] [N/A:N/A] Level: [21:Skin/Subcutaneous Tissue] [22:Skin/Subcutaneous Tissue] [N/A:N/A] Debridement Area (sq cm):6 [22:4] [N/A:N/A] Instrument: [21:Blade] [22:Blade] [N/A:N/A] Bleeding: [21:Moderate] [22:Moderate] [N/A:N/A] Hemostasis Achieved: [21:Pressure] [22:Pressure] [N/A:N/A] Procedural Pain: [21:0] [22:0] [N/A:N/A] Post Procedural Pain: [21:0] [22:0] [N/A:N/A] Debridement Treatment Procedure was tolerated [22:Procedure was tolerated] [N/A:N/A] Response: [21:well] [22:well] Post Debridement [21:3x2x0.2] [22:0.7x0.8x0.5] [N/A:N/A] Measurements L x W x D (cm) Post Debridement [21:0.942] [22:0.22] [N/A:N/A] Volume: (cm) Procedures Performed: Debridement [22:Debridement] [N/A:N/A] Treatment Notes Electronic Signature(s) Signed: 09/23/2019 5:58:56 PM By: Linton Ham MD Signed: 09/23/2019 6:08:37 PM By: Deon Pilling Entered By: Linton Ham on 09/23/2019 09:19:00 -------------------------------------------------------------------------------- Multi-Disciplinary Care Plan Details Patient Name: Date of Service: [name], [name] 09/23/2019 8:15 AM Medical Record Number:[id number] Patient Account Number: [id number] Date of Birth/Sex: Treating  RN: Nov 10, 1940 (79 y.o. [name] Primary Care Vada Yellen: [name] Other Clinician: Referring Briah Nary: Treating Victoire Deans/Extender:Robson, Birdena Crandall, [name] in Treatment: 9 Active Inactive Abuse / Safety / Falls / Self Care Management Nursing Diagnoses: Potential for falls Goals: Patient/caregiver will verbalize/demonstrate measures taken to prevent injury and/or falls Date Initiated: 07/21/2019 Target Resolution Date: 10/15/2019 Goal Status: Active Interventions: Assess fall risk on admission and as needed Assess impairment of mobility on admission and as needed per policy Notes: Nutrition Nursing Diagnoses: Impaired glucose control: actual or potential Potential for alteratiion in Nutrition/Potential for imbalanced nutrition Goals: Patient/caregiver will maintain therapeutic glucose control Date Initiated: 07/21/2019 Target Resolution Date: 10/15/2019 Goal Status: Active Interventions: Assess HgA1c results as ordered upon admission and as needed Assess patient nutrition upon admission and as needed per policy Provide education on elevated blood sugars and impact on wound healing Treatment Activities: Patient referred to Primary Care Physician for further nutritional evaluation : 07/21/2019 Notes: Wound/Skin Impairment Nursing Diagnoses: Impaired tissue integrity Knowledge deficit related to smoking impact on wound healing Knowledge deficit related to ulceration/compromised skin integrity Goals: Patient will demonstrate a reduced rate of smoking or cessation of smoking Date Initiated: 07/21/2019 Target Resolution Date: 10/15/2019 Goal Status: Active Patient/caregiver will verbalize understanding of skin care regimen Date Initiated: 07/21/2019 Date Inactivated: 07/29/2019 Target Resolution Date: 08/18/2019 Goal Status: Met Ulcer/skin breakdown will have a volume reduction of 30% by week 4 Date Initiated: 07/21/2019 Date Inactivated: 09/23/2019  Target Resolution Date: 09/17/2019 Unmet Reason: see wound Goal Status: Unmet measurements. Interventions: Assess patient/caregiver ability to obtain necessary supplies Assess patient/caregiver ability to perform ulcer/skin care regimen upon admission and  as needed Assess ulceration(s) every visit Provide education on smoking Provide education on ulcer and skin care Treatment Activities: Skin care regimen initiated : 07/21/2019 Topical wound management initiated : 07/21/2019 Notes: Electronic Signature(s) Signed: 09/23/2019 6:08:37 PM By: Deon Pilling Entered By: Deon Pilling on 09/23/2019 08:03:55 -------------------------------------------------------------------------------- Pain Assessment Details Patient Name: Date of Service: [name], [name] 09/23/2019 8:15 AM Medical Record Number:[id number] Patient Account Number: [id number] Date of Birth/Sex: Treating RN: [birth year] (79 y.o. [name] Primary Care Moses Odoherty: [name] Other Clinician: Referring Aaric Dolph: Treating Mersedes Alber/Extender:Robson, Birdena Crandall, [name] in Treatment: 9 Active Problems Location of Pain Severity and Description of Pain Patient Has Paino No Site Locations Pain Management and Medication Current Pain Management: Electronic Signature(s) Signed: 09/23/2019 5:43:40 PM By: Kela Millin Entered By: Kela Millin on 09/23/2019 08:27:07 -------------------------------------------------------------------------------- Patient/Caregiver Education Details Patient Name: Date of Service: [name], [name] 4/8/2021andnbsp8:15 AM Medical Record [id number] Patient Account Number: [id number] Date of Birth/Gender: Treating RN: 1940-10-14 (78 y.o. [name] Primary Care Physician: [name] Other Clinician: Referring Physician: Treating Physician/Extender:Robson, Birdena Crandall, [name] in Treatment: 9 Education Assessment Education  Provided To: Patient Education Topics Provided Elevated Blood Sugar/ Impact on Healing: Handouts: Elevated Blood Sugars: How Do They Affect Wound Healing Methods: Explain/Verbal, Printed Responses: Reinforcements needed Electronic Signature(s) Signed: 09/23/2019 6:08:37 PM By: Deon Pilling Entered By: Deon Pilling on 09/23/2019 08:04:08 -------------------------------------------------------------------------------- Wound Assessment Details Patient Name: Date of Service: [name], [name] 09/23/2019 8:15 AM Medical Record [id number] Patient Account Number: [id number] Date of Birth/Sex: Treating RN: 07/11/1940 (79 y.o. [name] Primary Care Azaliah Carrero: [name] Other Clinician: Referring Anael Rosch: Treating Ekaterina Denise/Extender:Robson, Birdena Crandall, [name] in Treatment: 9 Wound Status Wound Number: 21 Primary Diabetic Wound/Ulcer of the Lower Extremity Etiology: Wound Location: Left, Plantar Foot Wound Open Wounding Event: Gradually Appeared Status: Date Acquired: 06/17/2018 Comorbid Cataracts, Glaucoma, Hypertension, Weeks Of Treatment: 9 History: Peripheral Arterial Disease, Type II Diabetes, Clustered Wound: No Osteomyelitis, Neuropathy Wound Measurements Length: (cm) 0.4 % Reduction Width: (cm) 0.3 % Reduction Depth: (cm) 0.2 Epithelializ Area: (cm) 0.094 Tunneling: Volume: (cm) 0.019 Undermining Starting Ending Po Maximum D in Area: 95.9% in Volume: 98.4% ation: None No : Yes Position (o'clock): 6 sition (o'clock): 10 istance: (cm) 0.5 Wound Description Classification: Grade 2 Foul Odor A Wound Margin: Thickened Slough/Fibr Exudate Amount: Medium Exudate Type: Serous Exudate Color: amber Wound Bed Granulation Amount: Large (67-100%) Granulation Quality: Red, Pink Fascia Expo Necrotic Amount: None Present (0%) Fat Layer ( Tendon Expo Muscle Expo Joint Expos Bone Expose fter Cleansing: No ino No Exposed  Structure sed: No Subcutaneous Tissue) Exposed: Yes sed: No sed: No ed: No d: No Treatment Notes Wound #21 (Left, Plantar Foot) 3. Primary Dressing Applied Calcium Alginate Ag 4. Secondary Dressing Dry Gauze Roll Gauze Foam 5. Secured With Tape 7. Footwear/Offloading device applied Diabetic shoe Electronic Signature(s) Signed: 09/23/2019 5:43:40 PM By: Kela Millin Entered By: Kela Millin on 09/23/2019 08:36:35 -------------------------------------------------------------------------------- Wound Assessment Details Patient Name: Date of Service: [name], [name] 09/23/2019 8:15 AM Medical Record [id number] Patient Account Number: [id number] Date of Birth/Sex: Treating RN: April 14, 1941 (79 y.o. [name] Primary Care Tamee Battin: [name] Other Clinician: Referring Lannette Avellino: Treating Vladimir Lenhoff/Extender:Robson, Birdena Crandall, [name] in Treatment: 9 Wound Status Wound Number: 22 Primary Diabetic Wound/Ulcer of the Lower Extremity Etiology: Wound Location: Right Metatarsal head first Wound Open Wounding Event: Gradually Appeared Status: Date Acquired: 06/17/2018 Comorbid Cataracts, Glaucoma, Hypertension, Weeks Of Treatment: 9 History: Peripheral Arterial Disease, Type II Diabetes, Clustered Wound: No Osteomyelitis, Neuropathy Wound  Measurements Length: (cm) 0.7 % Reduction Width: (cm) 0.8 % Reduction Depth: (cm) 0.5 Epitheliali Area: (cm) 0.44 Tunneling: Volume: (cm) 0.22 Underminin Starting Ending P Maximum in Area: 29.9% in Volume: -17% zation: None No g: Yes Position (o'clock): 3 osition (o'clock): 6 Distance: (cm) 0.6 Wound Description Classification: Grade 2 Foul Odor Wound Margin: Thickened Due to Pro Exudate Amount: Medium Slough/Fib Exudate Type: Serous Exudate Color: amber Wound Bed Granulation Amount: Large (67-100%) Granulation Quality: Red, Pink Fascia Exp Necrotic Amount: None Present (0%)  Fat Layer Tendon Exp Muscle Exp Joint Expo Bone Expos After Cleansing: Yes duct Use: No rino No Exposed Structure osed: No (Subcutaneous Tissue) Exposed: Yes osed: No osed: No sed: No ed: No Treatment Notes Wound #22 (Right Metatarsal head first) 3. Primary Dressing Applied Calcium Alginate Ag 4. Secondary Dressing Dry Gauze Roll Gauze Foam 5. Secured With Tape 7. Footwear/Offloading device applied Diabetic shoe Electronic Signature(s) Signed: 09/23/2019 5:43:40 PM By: Kela Millin Entered By: Kela Millin on 09/23/2019 08:37:03 -------------------------------------------------------------------------------- Vitals Details Patient Name: Date of Service: [name], [name] 09/23/2019 8:15 AM Medical Record [id number] Patient Account Number: [id number] Date of Birth/Sex: Treating RN: March 26, 1941 (79 y.o. [name] Primary Care Janeisha Ryle: [name] Other Clinician: Referring Donna Snooks: Treating Jashan Cotten/Extender:Robson, Birdena Crandall, [name] in Treatment: 9 Vital Signs Time Taken: 08:24 Temperature (F): 98.1 Height (in): 73 Pulse (bpm): 68 Weight (lbs): 180 Respiratory Rate (breaths/min): 19 Body Mass Index (BMI): 23.7 Blood Pressure (mmHg): 129/64 Reference Range: 80 - 120 mg / dl Notes patient stated he did not check his CBG this morning Electronic Signature(s) Signed: 09/23/2019 5:43:40 PM By: Kela Millin Entered By: Kela Millin on 09/23/2019 08:27:01

## 2019-09-26 LAB — AEROBIC CULTURE W GRAM STAIN (SUPERFICIAL SPECIMEN)

## 2019-09-30 ENCOUNTER — Other Ambulatory Visit: Payer: Self-pay

## 2019-09-30 ENCOUNTER — Encounter (HOSPITAL_BASED_OUTPATIENT_CLINIC_OR_DEPARTMENT_OTHER): Payer: Medicare Other | Admitting: Internal Medicine

## 2019-09-30 DIAGNOSIS — E11621 Type 2 diabetes mellitus with foot ulcer: Secondary | ICD-10-CM | POA: Diagnosis not present

## 2019-10-02 NOTE — Progress Notes (Signed)
[name], [name] ([id number]) Visit Report for 09/30/2019 HPI Details Patient Name: Date of Service: [name], [name] 09/30/2019 8:15 AM Medical Record Number:[id number] Patient Account Number: [id number] Date of Birth/Sex: Treating RN: [birth year] (79 y.o. [name] Primary Care Provider: [name] Other Clinician: Referring Provider: Treating Provider/Extender:Robson, Birdena Crandall, [name] in Treatment: 10 History of Present Illness HPI Description: very pleasant 79 year old gentleman has type 2 diabetes and has been having an ulcer on his foot for several years. Been seeing as this time around since the end of October. Classified as a Wagner grade 2 because his previous x-rays did not show any problems. He has an MRI pending this Friday. discussing with him he says he has not been able to get his diabetic shoes yet. He continues to smoke his pipe and drinks moonshine and says he will not give this up. I have tried to counsel him regarding this but he firmly says that he is happy to listen to me but he is not going to give up his habits. 08/17/14 -- patient has no fresh complaints but has recently come with his MRI which was done on 08/12/2014. 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 Readmission: 07/21/2019 upon evaluation today patient presents for initial inspection here in our clinic concerning issues that he has been having with his bilateral feet. He has open wounds that been present at least since the beginning of the year although he does not know an exact time. He is previously had amputations of all toes and the distal portion of his foot. 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 With that being said he does have a history of diabetes, peripheral vascular disease, nicotine dependence, and hypertension. He has not had any recent  arterial or vascular studies he was noncompressible today I think he is going to require referral for arterial studies as well. 2/11; patient was readmitted to our clinic last week. He has bilateral plantar foot wounds in the setting of previous transmetatarsal amputations. He had his arterial studies done on 2/8; these were actually quite good. He was noncompressible on both sides however his waveforms were triphasic. The thought was noncompressible vessels consistent with medial calcification but without significant stenosis in the lower extremities. X-rays were done on the left this showed extensive chronic and postoperative changes without evidence of osteomyelitis. There was no evidence of osteomyelitis on the right foot there was mild vascular calcification MRI was suggested on the left based on clinical correlation. The patient arrives in clinic with some odor and drainage from the left foot. Right foot measures smaller. He is really not offloading these areas although he says he does not walk much 2/25; the patient arrives with some odor and drainage from the left foot again. Post debridement I cultured this area. He has the area on the midfoot roughly the third metatarsal head and the first metatarsal head on the right. He is in surgical shoes bilateral transmet 3/4; culture from last week showed abundant group B strep and a few Proteus. We will put him on cephalexin starting today. Still silver alginate to the wounds 3/11; due to issues with pharmacy he still does not have the cephalexin that I prescribed a week ago. Apparently this was called into a different pharmacy on Friday but they have not delivered it to him. We have been using silver alginate on the wounds but because of lack of  moisture change to silver collagen today. We are offloading as best we can in surgical shoes 3/18; he is taking the cephalexin that I prescribed 2 weeks ago. Using silver collagen. He has surgical  shoes. Setting of bilateral previous transmetatarsal amputations. Wounds are mirror-image wounds medially. 3/25; mirror-image wounds in the setting of previous transmetatarsal amputation. The wounds are mirror image medial foot wounds. We have been using silver collagen. We are using surgical shoes offloaded with felt. He came in today asking about his "$900" custom-made shoes although after talking to them it does not appear that he ever really wore these and they are more than a year old 4/8; mirror-image wounds on the medial part of the plantar feet in the setting of previous transmetatarsal amputations. We have been using silver collagen. I aggressively debrided these 2 weeks ago removing callus subcutaneous tissue from around the wound margins. He did indeed bring echo he has custom-made shoes with transmetatarsal inserts. He has never worn these. He claims to be not spending a lot of time on his feet 4/15; mirror-image wounds on the plantar feet in the setting of previous transmetatarsal amputation. We have been using silver alginate. Culture that I did last time grew MRSA and Proteus. I have him on doxycycline. This was a deep tissue culture. The Proteus was not actively plated against doxycycline but was otherwise pansensitive I think the doxycycline would cover this. His wounds actually looks somewhat better. Rolled senescent edges still around the wound edges Electronic Signature(s) Signed: 10/02/2019 6:55:41 AM By: Linton Ham MD Entered By: Linton Ham on 09/30/2019 08:46:26 -------------------------------------------------------------------------------- Physical Exam Details Patient Name: Date of Service: [name], [name] 09/30/2019 8:15 AM Medical Record [id number] Patient Account Number: [id number] Date of Birth/Sex: Treating RN: 01/17/1941 (79 y.o. [name] Primary Care Provider: [name] Other Clinician: Referring Provider: Treating  Provider/Extender:Robson, Birdena Crandall, [name] in Treatment: 10 Constitutional Sitting or standing Blood Pressure is within target range for patient.. Pulse regular and within target range for patient.Marland Kitchen Respirations regular, non-labored and within target range.. Temperature is normal and within the target range for the patient.Marland Kitchen Appears in no distress. Cardiovascular He has palpable pedal pulses. Notes Wound exam; again both wounds have some senescent edges but much less buildup callus. He is wearing his own custom-made shoes. This may be helpful. On the left he has a small linear area in the mid aspect a small linear area going laterally On the right circular wound with a similar rolled edge. No evidence of infection in either area Electronic Signature(s) Signed: 10/02/2019 6:55:41 AM By: Linton Ham MD Entered By: Linton Ham on 09/30/2019 08:47:52 -------------------------------------------------------------------------------- Physician Orders Details Patient Name: Date of Service: [name], [name] 09/30/2019 8:15 AM Medical Record [id number] Patient Account Number: [id number] Date of Birth/Sex: Treating RN: 1941/05/22 (79 y.o. [name] Primary Care Provider: [name] Other Clinician: Referring Provider: Treating Provider/Extender:Robson, Birdena Crandall, [name] in Treatment: 10 Verbal / Phone Orders: No Diagnosis Coding ICD-10 Coding Code Description E11.621 Type 2 diabetes mellitus with foot ulcer L97.522 Non-pressure chronic ulcer of other part of left foot with fat layer exposed L97.512 Non-pressure chronic ulcer of other part of right foot with fat layer exposed I73.89 Other specified peripheral vascular diseases F17.218 Nicotine dependence, cigarettes, with other nicotine-induced disorders I10 Essential (primary) hypertension L84 Corns and callosities Follow-up Appointments Return Appointment in 2 weeks. -  Thursday Dressing Change Frequency Change dressing three times week. Wound Cleansing Clean wound with Wound Cleanser May shower with protection. Primary  Wound Dressing Wound #21 Left,Plantar Foot Calcium Alginate with Silver Wound #22 Right Metatarsal head first Calcium Alginate with Silver Secondary Dressing Wound #21 Left,Plantar Foot Foam - foam donut to offload Kerlix/Rolled Gauze Dry Gauze Wound #22 Right Metatarsal head first Foam - foam donut to offload Kerlix/Rolled Gauze Dry Gauze Edema Control Avoid standing for long periods of time Elevate legs to the level of the heart or above for 30 minutes daily and/or when sitting, a frequency of: - throughout the day. Off-Loading Other: - felt patient's personal shoes. patient to wear diabetic shoes. patient to limit about of time spent walking and standing. [place] [facility] for wound care. - [facility], maintenance) Signed: 10/01/2019 4:22:27 PM By: Levan Hurst RN, BSN Signed: 10/02/2019 6:55:41 AM By: Linton Ham MD Entered By: Levan Hurst on 09/30/2019 08:23:50 -------------------------------------------------------------------------------- Problem List Details Patient Name: Date of Service: [name], [name] 09/30/2019 8:15 AM Medical Record Number:[id number] Patient Account Number: [id number] Date of Birth/Sex: Treating RN: 1941/01/04 (79 y.o. [name] Primary Care Provider: [name] Other Clinician: Referring Provider: Treating Provider/Extender:Robson, Birdena Crandall, [name] in Treatment: 10 Active Problems ICD-10 Evaluated Encounter Code Description Active Date Today Diagnosis E11.621 Type 2 diabetes mellitus with foot ulcer 07/21/2019 No Yes L97.522 Non-pressure chronic ulcer of other part of left foot 07/21/2019 No Yes with fat layer exposed L97.512 Non-pressure chronic ulcer of other part of right foot 07/21/2019 No Yes with fat  layer exposed I73.89 Other specified peripheral vascular diseases 07/21/2019 No Yes F17.218 Nicotine dependence, cigarettes, with other nicotine- 07/21/2019 No Yes induced disorders I10 Essential (primary) hypertension 07/21/2019 No Yes L84 Corns and callosities 07/21/2019 No Yes Inactive Problems Resolved Problems Electronic Signature(s) Signed: 10/02/2019 6:55:41 AM By: Linton Ham MD Entered By: Linton Ham on 09/30/2019 08:44:31 -------------------------------------------------------------------------------- Progress Note Details Patient Name: Date of Service: [name], [name] 09/30/2019 8:15 AM Medical Record [id number] Patient Account Number: [id number] Date of Birth/Sex: Treating RN: August 29, 1940 (79 y.o. [name] Primary Care Provider: [name] Other Clinician: Referring Provider: Treating Provider/Extender:Robson, Birdena Crandall, [name] in Treatment: 10 Subjective History of Present Illness (HPI) very pleasant 79 year old gentleman has type 2 diabetes and has been having an ulcer on his foot for several years. Been seeing as this time around since the end of October. Classified as a Wagner grade 2 because his previous x- rays did not show any problems. He has an MRI pending this Friday. discussing with him he says he has not been able to get his diabetic shoes yet. He continues to smoke his pipe and drinks moonshine and says he will not give this up. I have tried to counsel him regarding this but he firmly says that he is happy to listen to me but he is not going to give up his habits. 08/17/14 -- patient has no fresh complaints but has recently come with his MRI which was done on 08/12/2014. 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 Readmission: 07/21/2019 upon evaluation today patient presents for initial inspection here in our clinic  concerning issues that he has been having with his bilateral feet. He has open wounds that been present at least since the beginning of the year although he does not know an exact time. He is previously had amputations of all toes and the distal portion of his foot. 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 With that being said he  does have a history of diabetes, peripheral vascular disease, nicotine dependence, and hypertension. He has not had any recent arterial or vascular studies he was noncompressible today I think he is going to require referral for arterial studies as well. 2/11; patient was readmitted to our clinic last week. He has bilateral plantar foot wounds in the setting of previous transmetatarsal amputations. He had his arterial studies done on 2/8; these were actually quite good. He was noncompressible on both sides however his waveforms were triphasic. The thought was noncompressible vessels consistent with medial calcification but without significant stenosis in the lower extremities. X-rays were done on the left this showed extensive chronic and postoperative changes without evidence of osteomyelitis. There was no evidence of osteomyelitis on the right foot there was mild vascular calcification MRI was suggested on the left based on clinical correlation. The patient arrives in clinic with some odor and drainage from the left foot. Right foot measures smaller. He is really not offloading these areas although he says he does not walk much 2/25; the patient arrives with some odor and drainage from the left foot again. Post debridement I cultured this area. He has the area on the midfoot roughly the third metatarsal head and the first metatarsal head on the right. He is in surgical shoes bilateral transmet 3/4; culture from last week showed abundant group B strep and a few Proteus. We will put him on cephalexin starting today. Still silver alginate to the  wounds 3/11; due to issues with pharmacy he still does not have the cephalexin that I prescribed a week ago. Apparently this was called into a different pharmacy on Friday but they have not delivered it to him. We have been using silver alginate on the wounds but because of lack of moisture change to silver collagen today. We are offloading as best we can in surgical shoes 3/18; he is taking the cephalexin that I prescribed 2 weeks ago. Using silver collagen. He has surgical shoes. Setting of bilateral previous transmetatarsal amputations. Wounds are mirror-image wounds medially. 3/25; mirror-image wounds in the setting of previous transmetatarsal amputation. The wounds are mirror image medial foot wounds. We have been using silver collagen. We are using surgical shoes offloaded with felt. He came in today asking about his "$900" custom-made shoes although after talking to them it does not appear that he ever really wore these and they are more than a year old 4/8; mirror-image wounds on the medial part of the plantar feet in the setting of previous transmetatarsal amputations. We have been using silver collagen. I aggressively debrided these 2 weeks ago removing callus subcutaneous tissue from around the wound margins. He did indeed bring echo he has custom-made shoes with transmetatarsal inserts. He has never worn these. He claims to be not spending a lot of time on his feet 4/15; mirror-image wounds on the plantar feet in the setting of previous transmetatarsal amputation. We have been using silver alginate. Culture that I did last time grew MRSA and Proteus. I have him on doxycycline. This was a deep tissue culture. The Proteus was not actively plated against doxycycline but was otherwise pansensitive I think the doxycycline would cover this. His wounds actually looks somewhat better. Rolled senescent edges still around the wound edges Objective Constitutional Sitting or standing Blood  Pressure is within target range for patient.. Pulse regular and within target range for patient.Marland Kitchen Respirations regular, non-labored and within target range.. Temperature is normal and within the target range for the patient.Marland Kitchen  Appears in no distress. Vitals Time Taken: 7:59 AM, Height: 73 in, Weight: 180 lbs, BMI: 23.7, Temperature: 98.2 F, Pulse: 58 bpm, Respiratory Rate: 19 breaths/min, Blood Pressure: 129/62 mmHg. Cardiovascular He has palpable pedal pulses. General Notes: Wound exam; again both wounds have some senescent edges but much less buildup callus. He is wearing his own custom-made shoes. This may be helpful. On the left he has a small linear area in the mid aspect a small linear area going laterally ooOn the right circular wound with a similar rolled edge. ooNo evidence of infection in either area Integumentary (Hair, Skin) Wound #21 status is Open. Original cause of wound was Gradually Appeared. The wound is located on the [place]. The wound measures 0.7cm length x 0.6cm width x 0.3cm depth; 0.33cm^2 area and 0.099cm^3 volume. There is Fat Layer (Subcutaneous Tissue) Exposed exposed. There is no tunneling or undermining noted. There is a medium amount of serosanguineous drainage noted. The wound margin is thickened. There is large (67- 100%) red, pink granulation within the wound bed. There is no necrotic tissue within the wound bed. Wound #22 status is Open. Original cause of wound was Gradually Appeared. The wound is located on the Right Metatarsal head first. The wound measures 0.7cm length x 0.7cm width x 0.4cm depth; 0.385cm^2 area and 0.154cm^3 volume. There is Fat Layer (Subcutaneous Tissue) Exposed exposed. There is no tunneling noted, however, there is undermining starting at 1:00 and ending at 4:00 with a maximum distance of 0.5cm. There is a medium amount of serosanguineous drainage noted. The wound margin is thickened. There is large (67-100%) red, pink  granulation within the wound bed. There is no necrotic tissue within the wound bed. Assessment Active Problems ICD-10 Type 2 diabetes mellitus with foot ulcer Non-pressure chronic ulcer of other part of left foot with fat layer exposed Non-pressure chronic ulcer of other part of right foot with fat layer exposed Other specified peripheral vascular diseases Nicotine dependence, cigarettes, with other nicotine-induced disorders Essential (primary) hypertension Corns and callosities Plan Follow-up Appointments: Return Appointment in 2 weeks. - Thursday Dressing Change Frequency: Change dressing three times week. Wound Cleansing: Clean wound with Wound Cleanser May shower with protection. Primary Wound Dressing: Wound #21 Left,Plantar Foot: Calcium Alginate with Silver Wound #22 Right Metatarsal head first: Calcium Alginate with Silver Secondary Dressing: Wound #21 Left,Plantar Foot: Foam - foam donut to offload Kerlix/Rolled Gauze Dry Gauze Wound #22 Right Metatarsal head first: Foam - foam donut to offload Kerlix/Rolled Gauze Dry Gauze Edema Control: Avoid standing for long periods of time Elevate legs to the level of the heart or above for 30 minutes daily and/or when sitting, a frequency of: - throughout the day. Off-Loading: Other: - felt patient's personal shoes. patient to wear diabetic shoes. patient to limit about of time spent walking and standing. Home Health: [place] [facility] for wound care. - [facility] 1. Silver alginate to both wound areas 2. Gauze 3. He is wearing his own custom-made shoes with custom inserts. In general things look better this week after wearing the shoes and that there was certainly less circumferential callus that I had to remove to prep 2 weeks ago. 4. He has rolled senescent edges I am not sure that these are going to support further epithelialization. These may need to be debrided although I elected not to do this  today Electronic Signature(s) Signed: 10/02/2019 6:55:41 AM By: Linton Ham MD Entered By: Linton Ham on 09/30/2019 08:49:20 -------------------------------------------------------------------------------- SuperBill Details Patient Name:  Date of Service: [name], [name] 09/30/2019 Medical Record Number:[id number] Patient Account Number: [id number] Date of Birth/Sex: Treating RN: [birth year] (79 y.o. [name] Primary Care Provider: [name] Other Clinician: Referring Provider: Treating Provider/Extender:Robson, Birdena Crandall, [name] in Treatment: 10 Diagnosis Coding ICD-10 Codes Code Description E11.621 Type 2 diabetes mellitus with foot ulcer L97.522 Non-pressure chronic ulcer of other part of left foot with fat layer exposed L97.512 Non-pressure chronic ulcer of other part of right foot with fat layer exposed I73.89 Other specified peripheral vascular diseases F17.218 Nicotine dependence, cigarettes, with other nicotine-induced disorders I10 Essential (primary) hypertension L84 Corns and callosities Facility Procedures The patient participates with Medicare or their insurance follows the Medicare Facility Guidelines: CPT4 Code Description Modifier Quantity [id number] [phone] - WOUND CARE VISIT-LEV 5 EST PT 1 Physician Procedures CPT4 Code Description: [id number] - WC PHYS LEVEL 3 - EST PT ICD-10 Diagnosis Description E11.621 Type 2 diabetes mellitus with foot ulcer L97.522 Non-pressure chronic ulcer of other part of left foot w L97.512 Non-pressure chronic ulcer of other part  of right foot Modifier: ith fat layer ex with fat layer e Quantity: 1 posed xposed Electronic Signature(s) Signed: 10/02/2019 6:55:41 AM By: Linton Ham MD Entered By: Linton Ham on 09/30/2019 08:51:22

## 2019-10-14 ENCOUNTER — Encounter (HOSPITAL_BASED_OUTPATIENT_CLINIC_OR_DEPARTMENT_OTHER): Payer: Medicare Other | Admitting: Internal Medicine

## 2019-10-14 ENCOUNTER — Other Ambulatory Visit: Payer: Self-pay

## 2019-10-14 DIAGNOSIS — E11621 Type 2 diabetes mellitus with foot ulcer: Secondary | ICD-10-CM | POA: Diagnosis not present

## 2019-10-21 ENCOUNTER — Encounter (HOSPITAL_BASED_OUTPATIENT_CLINIC_OR_DEPARTMENT_OTHER): Payer: Medicare Other | Attending: Internal Medicine | Admitting: Internal Medicine

## 2019-10-21 ENCOUNTER — Other Ambulatory Visit: Payer: Self-pay

## 2019-10-21 DIAGNOSIS — F17218 Nicotine dependence, cigarettes, with other nicotine-induced disorders: Secondary | ICD-10-CM | POA: Diagnosis not present

## 2019-10-21 DIAGNOSIS — E1151 Type 2 diabetes mellitus with diabetic peripheral angiopathy without gangrene: Secondary | ICD-10-CM | POA: Insufficient documentation

## 2019-10-21 DIAGNOSIS — E11621 Type 2 diabetes mellitus with foot ulcer: Secondary | ICD-10-CM | POA: Insufficient documentation

## 2019-10-21 DIAGNOSIS — L84 Corns and callosities: Secondary | ICD-10-CM | POA: Diagnosis not present

## 2019-10-21 DIAGNOSIS — I10 Essential (primary) hypertension: Secondary | ICD-10-CM | POA: Diagnosis not present

## 2019-10-21 DIAGNOSIS — L97512 Non-pressure chronic ulcer of other part of right foot with fat layer exposed: Secondary | ICD-10-CM | POA: Diagnosis not present

## 2019-10-21 DIAGNOSIS — L97522 Non-pressure chronic ulcer of other part of left foot with fat layer exposed: Secondary | ICD-10-CM | POA: Diagnosis not present

## 2019-11-03 ENCOUNTER — Telehealth: Payer: Self-pay | Admitting: Orthopedic Surgery

## 2019-11-03 ENCOUNTER — Other Ambulatory Visit: Payer: Self-pay

## 2019-11-03 ENCOUNTER — Other Ambulatory Visit: Payer: Self-pay | Admitting: Family

## 2019-11-03 MED ORDER — PREGABALIN 50 MG PO CAPS: 50.0000 mg | ORAL_CAPSULE | Freq: Three times a day (TID) | ORAL | 0 refills | Status: DC

## 2019-11-03 NOTE — Telephone Encounter (Signed)
Please see below and advise.

## 2019-11-03 NOTE — Telephone Encounter (Signed)
In January he was placed on lyrica and should not be taking gabapentin.   Can we clarify, could increase dose

## 2019-11-03 NOTE — Telephone Encounter (Signed)
Pt called in stating the gabapentin isn't helping and it's to the point where he isn't sleeping so he would like to see if he can be prescribed something else.   [phone]

## 2019-11-03 NOTE — Telephone Encounter (Signed)
I called and pt states that he has not taken the lyrica and gabapentin together. Advised per Erinto stop taking the gabapentin and we will send in an updated rx for lyrica to the pharm. Pt voiced understanding and will call with any questions.

## 2019-11-04 ENCOUNTER — Encounter (HOSPITAL_BASED_OUTPATIENT_CLINIC_OR_DEPARTMENT_OTHER): Payer: Medicare Other | Admitting: Internal Medicine

## 2019-11-08 NOTE — Progress Notes (Signed)
JAH, MCQUEARY ([id number]) Visit Report for 10/21/2019 Debridement Details Patient Name: Date of Service: [name], [place] 10/21/2019 8:15 A M Medical Record Number: [id number] Patient Account Number: [id number] Date of Birth/Sex: Treating RN: January 27, 1941 (79 y.o. [name] Primary Care Provider: [name] Other Clinician: Referring Provider: Treating Provider/Extender: Gabriel Rainwater in Treatment: 13 Debridement Performed for Assessment: Wound #21 [place] Performed By: Physician Ricard Dillon., MD Debridement Type: Debridement Severity of Tissue Pre Debridement: Fat layer exposed Level of Consciousness (Pre-procedure): Awake and Alert Pre-procedure Verification/Time Out Yes - 08:45 Taken: Start Time: 08:46 Pain Control: Lidocaine 4% T opical Solution T Area Debrided (L x W): otal 1 (cm) x 2 (cm) = 2 (cm) Tissue and other material debrided: Viable, Non-Viable, Callus, Subcutaneous, Skin: Dermis , Fibrin/Exudate Level: Skin/Subcutaneous Tissue Debridement Description: Excisional Instrument: Curette Bleeding: Moderate Hemostasis Achieved: Silver Nitrate End Time: 08:49 Procedural Pain: 0 Post Procedural Pain: 0 Response to Treatment: Procedure was tolerated well Level of Consciousness (Post- Awake and Alert procedure): Post Debridement Measurements of Total Wound Length: (cm) 0.5 Width: (cm) 2 Depth: (cm) 0.8 Volume: (cm) 0.628 Character of Wound/Ulcer Post Debridement: Improved Severity of Tissue Post Debridement: Fat layer exposed Post Procedure Diagnosis Same as Pre-procedure Electronic Signature(s) Signed: 10/21/2019 5:30:42 PM By: Linton Ham MD Signed: 11/08/2019 1:34:25 PM By: Deon Pilling Entered By: Linton Ham on 10/21/2019 09:01:03 -------------------------------------------------------------------------------- Debridement Details Patient Name: Date of Service: [name], [name] 10/21/2019  8:15 A M Medical Record Number: [id number] Patient Account Number: [id number] Date of Birth/Sex: Treating RN: [birth year] (79 y.o. [name] Primary Care Provider: [name] Other Clinician: Referring Provider: Treating Provider/Extender: Gabriel Rainwater in Treatment: 13 Debridement Performed for Assessment: Wound #22 Right Metatarsal head first Performed By: Physician Ricard Dillon., MD Debridement Type: Debridement Severity of Tissue Pre Debridement: Fat layer exposed Level of Consciousness (Pre-procedure): Awake and Alert Pre-procedure Verification/Time Out Yes - 08:45 Taken: Start Time: 08:46 Pain Control: Lidocaine 4% T opical Solution T Area Debrided (L x W): otal 1.5 (cm) x 1.5 (cm) = 2.25 (cm) Tissue and other material debrided: Viable, Non-Viable, Callus, Subcutaneous, Skin: Dermis , Fibrin/Exudate Level: Skin/Subcutaneous Tissue Debridement Description: Excisional Instrument: Curette Bleeding: Moderate Hemostasis Achieved: Silver Nitrate End Time: 08:49 Procedural Pain: 0 Post Procedural Pain: 0 Response to Treatment: Procedure was tolerated well Level of Consciousness (Post- Awake and Alert procedure): Post Debridement Measurements of Total Wound Length: (cm) 1 Width: (cm) 1 Depth: (cm) 0.5 Volume: (cm) 0.393 Character of Wound/Ulcer Post Debridement: Improved Severity of Tissue Post Debridement: Fat layer exposed Post Procedure Diagnosis Same as Pre-procedure Electronic Signature(s) Signed: 10/21/2019 5:30:42 PM By: Linton Ham MD Signed: 11/08/2019 1:34:25 PM By: Deon Pilling Entered By: Linton Ham on 10/21/2019 09:01:11 -------------------------------------------------------------------------------- HPI Details Patient Name: Date of Service: [name], [name] 10/21/2019 8:15 A M Medical Record Number: [id number] Patient Account Number: [id number] Date of Birth/Sex: Treating RN: 01-13-1941 (79 y.o.  [name] Primary Care Provider: [name] Other Clinician: Referring Provider: Treating Provider/Extender: Gabriel Rainwater in Treatment: 13 History of Present Illness HPI Description: very pleasant 79 year old gentleman has type 2 diabetes and has been having an ulcer on his foot for several years. Been seeing as this time around since the end of October. Classified as a Wagner grade 2 because his previous x-rays did not show any problems. He has an MRI pending this Friday. discussing with him he says  he has not been able to get his diabetic shoes yet. He continues to smoke his pipe and drinks moonshine and says he will not give this up. I have tried to counsel him regarding this but he firmly says that he is happy to listen to me but he is not going to give up his habits. 08/17/14 -- patient has no fresh complaints but has recently come with his MRI which was done on 08/12/2014. 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 Readmission: 07/21/2019 upon evaluation today patient presents for initial inspection here in our clinic concerning issues that he has been having with his bilateral feet. He has open wounds that been present at least since the beginning of the year although he does not know an exact time. He is previously had amputations of all toes and the distal portion of his foot. 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 With that being said he does have a history of diabetes, peripheral vascular disease, nicotine dependence, and hypertension. He has not had any recent arterial or vascular studies he was noncompressible today I think he is going to require referral for arterial studies as well. 2/11; patient was readmitted to our clinic last week. He has bilateral plantar foot wounds in the setting of previous  transmetatarsal amputations. He had his arterial studies done on 2/8; these were actually quite good. He was noncompressible on both sides however his waveforms were triphasic. The thought was noncompressible vessels consistent with medial calcification but without significant stenosis in the lower extremities. X-rays were done on the left this showed extensive chronic and postoperative changes without evidence of osteomyelitis. There was no evidence of osteomyelitis on the right foot there was mild vascular calcification MRI was suggested on the left based on clinical correlation. The patient arrives in clinic with some odor and drainage from the left foot. Right foot measures smaller. He is really not offloading these areas although he says he does not walk much 2/25; the patient arrives with some odor and drainage from the left foot again. Post debridement I cultured this area. He has the area on the midfoot roughly the third metatarsal head and the first metatarsal head on the right. He is in surgical shoes bilateral transmet 3/4; culture from last week showed abundant group B strep and a few Proteus. We will put him on cephalexin starting today. Still silver alginate to the wounds 3/11; due to issues with pharmacy he still does not have the cephalexin that I prescribed a week ago. Apparently this was called into a different pharmacy on Friday but they have not delivered it to him. We have been using silver alginate on the wounds but because of lack of moisture change to silver collagen today. We are offloading as best we can in surgical shoes 3/18; he is taking the cephalexin that I prescribed 2 weeks ago. Using silver collagen. He has surgical shoes. Setting of bilateral previous transmetatarsal amputations. Wounds are mirror-image wounds medially. 3/25; mirror-image wounds in the setting of previous transmetatarsal amputation. The wounds are mirror image medial foot wounds. We have been using  silver collagen. We are using surgical shoes offloaded with felt. He came in today asking about his "$900" custom-made shoes although after talking to them it does not appear that he ever really wore these and they are more than a year old 4/8; mirror-image wounds on the medial part  of the plantar feet in the setting of previous transmetatarsal amputations. We have been using silver collagen. I aggressively debrided these 2 weeks ago removing callus subcutaneous tissue from around the wound margins. He did indeed bring echo he has custom-made shoes with transmetatarsal inserts. He has never worn these. He claims to be not spending a lot of time on his feet 4/15; mirror-image wounds on the plantar feet in the setting of previous transmetatarsal amputation. We have been using silver alginate. Culture that I did last time grew MRSA and Proteus. I have him on doxycycline. This was a deep tissue culture. The Proteus was not actively plated against doxycycline but was otherwise pansensitive I think the doxycycline would cover this. His wounds actually looks somewhat better. Rolled senescent edges still around the wound edges 4/29; mirror-image wounds on the plantar feet in the setting of previous bilateral transmetatarsal amputations we have been using silver alginate. Last time I saw him 2 weeks ago I gave him a course of doxycycline. Things seem to have cleaned up. This is in response to a deep tissue culture that showed MRSA and Proteus 5/6; wounds on the bilateral plantar feet in the setting of previous transmetatarsal amputations. We have been using silver alginate. Arrives in the clinic today with the same raised thick callus and subcutaneous tissue. He has home health changing the dressing. He brought up the fact that he has transportation through [facility] apparently he only has "12 visits". Otherwise he has the pay $40 for a cab ride there and back from his home which I guess [place]  [place] [address]. Electronic Signature(s) Signed: 10/21/2019 5:30:42 PM By: Linton Ham MD Entered By: Linton Ham on 10/21/2019 09:02:30 -------------------------------------------------------------------------------- Physical Exam Details Patient Name: Date of Service: [name], [name] 10/21/2019 8:15 A M Medical Record Number: [id number] Patient Account Number: [id number] Date of Birth/Sex: Treating RN: June 02, 1941 (79 y.o. [name] Primary Care Provider: [name] Other Clinician: Referring Provider: Treating Provider/Extender: Gabriel Rainwater in Treatment: 13 Constitutional Patient is hypertensive.. Pulse regular and within target range for patient.Marland Kitchen Respirations regular, non-labored and within target range.. Temperature is normal and within the target range for the patient.Marland Kitchen Appears in no distress. Notes Wound exam; this still the same callused rolled up skin and subcutaneous tissue from the wound margins. When he comes in it is even hard to identify a wound on the left but after debridement there clearly is the same linear wound. On the right more punched out both required debridement with a #5 curette. Hand hemostasis with silver nitrate the wounds clean up quite nicely but they are in the same condition or similar condition each time he comes in. He claims to not be walking much on these feet it is difficult to believe that looking at this condition of the wound on arrival to clinic Electronic Signature(s) Signed: 10/21/2019 5:30:42 PM By: Linton Ham MD Entered By: Linton Ham on 10/21/2019 09:04:08 -------------------------------------------------------------------------------- Physician Orders Details Patient Name: Date of Service: [name], [name] 10/21/2019 8:15 A M Medical Record Number: [id number] Patient Account Number: [id number] Date of Birth/Sex: Treating RN: Oct 23, 1940 (79 y.o. [name] Primary Care  Provider: Other Clinician: [name] Referring Provider: Treating Provider/Extender: Gabriel Rainwater in Treatment: 13 Verbal / Phone Orders: No Diagnosis Coding ICD-10 Coding Code Description E11.621 Type 2 diabetes mellitus with foot ulcer L97.522 Non-pressure chronic ulcer of other part of left foot with fat  layer exposed L97.512 Non-pressure chronic ulcer of other part of right foot with fat layer exposed I73.89 Other specified peripheral vascular diseases F17.218 Nicotine dependence, cigarettes, with other nicotine-induced disorders I10 Essential (primary) hypertension L84 Corns and callosities Follow-up Appointments ppointment in 2 weeks. - Thursday Return A Dressing Change Frequency Change dressing three times week. Wound Cleansing Clean wound with Wound Cleanser May shower with protection. Primary Wound Dressing Wound #21 Left,Plantar Foot Hydrofera Blue Wound #22 Right Metatarsal head first Hydrofera Blue Secondary Dressing Wound #21 Left,Plantar Foot Foam - foam donut to offload Kerlix/Rolled Gauze Dry Gauze Wound #22 Right Metatarsal head first Foam - foam donut to offload Kerlix/Rolled Gauze Dry Gauze Edema Control Avoid standing for long periods of time Elevate legs to the level of the heart or above for 30 minutes daily and/or when sitting, a frequency of: - throughout the day. Off-Loading Other: - felt patient's personal shoes. patient to wear diabetic shoes. patient to limit about of time spent walking and standing. [facility] for wound care. - [employer], maintenance) Signed: 10/21/2019 5:30:42 PM By: Linton Ham MD Signed: 10/21/2019 5:34:26 PM By: Deon Pilling Entered By: Deon Pilling on 10/21/2019 08:57:13 -------------------------------------------------------------------------------- Problem List Details Patient Name: Date of Service: [name], [name]  10/21/2019 8:15 A M Medical Record Number: [id number] Patient Account Number: [id number] Date of Birth/Sex: Treating RN: 10-17-1940 (79 y.o. [name], Meta.Reding Primary Care Provider: [name] Other Clinician: Referring Provider: Treating Provider/Extender: Gabriel Rainwater in Treatment: 13 Active Problems ICD-10 Encounter Code Description Active Date MDM Diagnosis E11.621 Type 2 diabetes mellitus with foot ulcer 07/21/2019 No Yes L97.522 Non-pressure chronic ulcer of other part of left foot with fat layer exposed 07/21/2019 No Yes L97.512 Non-pressure chronic ulcer of other part of right foot with fat layer exposed 07/21/2019 No Yes I73.89 Other specified peripheral vascular diseases 07/21/2019 No Yes F17.218 Nicotine dependence, cigarettes, with other nicotine-induced disorders 07/21/2019 No Yes I10 Essential (primary) hypertension 07/21/2019 No Yes L84 Corns and callosities 07/21/2019 No Yes Inactive Problems Resolved Problems Electronic Signature(s) Signed: 10/21/2019 5:30:42 PM By: Linton Ham MD Entered By: Linton Ham on 10/21/2019 08:58:38 -------------------------------------------------------------------------------- Progress Note Details Patient Name: Date of Service: [name], [name] 10/21/2019 8:15 A M Medical Record Number: [id number] Patient Account Number: [id number] Date of Birth/Sex: Treating RN: Oct 26, 1940 (79 y.o. [name] Primary Care Provider: [name] Other Clinician: Referring Provider: Treating Provider/Extender: Gabriel Rainwater in Treatment: 13 Subjective History of Present Illness (HPI) very pleasant 79 year old gentleman has type 2 diabetes and has been having an ulcer on his foot for several years. Been seeing as this time around since the end of October. Classified as a Wagner grade 2 because his previous x-rays did not show any problems. He has an MRI pending this  Friday. discussing with him he says he has not been able to get his diabetic shoes yet. He continues to smoke his pipe and drinks moonshine and says he will not give this up. I have tried to counsel him regarding this but he firmly says that he is happy to listen to me but he is not going to give up his habits. 08/17/14 -- patient has no fresh complaints but has recently come with his MRI which was done on 08/12/2014. 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 Readmission:  07/21/2019 upon evaluation today patient presents for initial inspection here in our clinic concerning issues that he has been having with his bilateral feet. He has open wounds that been present at least since the beginning of the year although he does not know an exact time. He is previously had amputations of all toes and the distal portion of his foot. 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 With that being said he does have a history of diabetes, peripheral vascular disease, nicotine dependence, and hypertension. He has not had any recent arterial or vascular studies he was noncompressible today I think he is going to require referral for arterial studies as well. 2/11; patient was readmitted to our clinic last week. He has bilateral plantar foot wounds in the setting of previous transmetatarsal amputations. He had his arterial studies done on 2/8; these were actually quite good. He was noncompressible on both sides however his waveforms were triphasic. The thought was noncompressible vessels consistent with medial calcification but without significant stenosis in the lower extremities. X-rays were done on the left this showed extensive chronic and postoperative changes without evidence of osteomyelitis. There was no evidence of osteomyelitis on the right foot there was mild vascular calcification MRI was  suggested on the left based on clinical correlation. The patient arrives in clinic with some odor and drainage from the left foot. Right foot measures smaller. He is really not offloading these areas although he says he does not walk much 2/25; the patient arrives with some odor and drainage from the left foot again. Post debridement I cultured this area. He has the area on the midfoot roughly the third metatarsal head and the first metatarsal head on the right. He is in surgical shoes bilateral transmet 3/4; culture from last week showed abundant group B strep and a few Proteus. We will put him on cephalexin starting today. Still silver alginate to the wounds 3/11; due to issues with pharmacy he still does not have the cephalexin that I prescribed a week ago. Apparently this was called into a different pharmacy on Friday but they have not delivered it to him. We have been using silver alginate on the wounds but because of lack of moisture change to silver collagen today. We are offloading as best we can in surgical shoes 3/18; he is taking the cephalexin that I prescribed 2 weeks ago. Using silver collagen. He has surgical shoes. Setting of bilateral previous transmetatarsal amputations. Wounds are mirror-image wounds medially. 3/25; mirror-image wounds in the setting of previous transmetatarsal amputation. The wounds are mirror image medial foot wounds. We have been using silver collagen. We are using surgical shoes offloaded with felt. He came in today asking about his "$900" custom-made shoes although after talking to them it does not appear that he ever really wore these and they are more than a year old 4/8; mirror-image wounds on the medial part of the plantar feet in the setting of previous transmetatarsal amputations. We have been using silver collagen. I aggressively debrided these 2 weeks ago removing callus subcutaneous tissue from around the wound margins. He did indeed bring echo he has  custom-made shoes with transmetatarsal inserts. He has never worn these. He claims to be not spending a lot of time on his feet 4/15; mirror-image wounds on the plantar feet in the setting of previous transmetatarsal amputation. We have been using silver alginate. Culture that I did last time grew MRSA and Proteus. I have him on doxycycline. This was a deep tissue  culture. The Proteus was not actively plated against doxycycline but was otherwise pansensitive I think the doxycycline would cover this. His wounds actually looks somewhat better. Rolled senescent edges still around the wound edges 4/29; mirror-image wounds on the plantar feet in the setting of previous bilateral transmetatarsal amputations we have been using silver alginate. Last time I saw him 2 weeks ago I gave him a course of doxycycline. Things seem to have cleaned up. This is in response to a deep tissue culture that showed MRSA and Proteus 5/6; wounds on the bilateral plantar feet in the setting of previous transmetatarsal amputations. We have been using silver alginate. Arrives in the clinic today with the same raised thick callus and subcutaneous tissue. He has home health changing the dressing. He brought up the fact that he has transportation through [facility] apparently he only has "12 visits". Otherwise he has the pay $40 for a cab ride there and back from his home which I guess is [place] [address]. Objective Constitutional Patient is hypertensive.. Pulse regular and within target range for patient.Marland Kitchen Respirations regular, non-labored and within target range.. Temperature is normal and within the target range for the patient.Marland Kitchen Appears in no distress. Vitals Time Taken: 8:36 AM, Height: 73 in, Weight: 180 lbs, BMI: 23.7, Temperature: 98.4 F, Pulse: 66 bpm, Respiratory Rate: 18 breaths/min, Blood Pressure: 154/69 mmHg. General Notes: Wound exam; this still the same callused rolled up skin and subcutaneous tissue  from the wound margins. When he comes in it is even hard to identify a wound on the left but after debridement there clearly is the same linear wound. On the right more punched out both required debridement with a #5 curette. Hand hemostasis with silver nitrate the wounds clean up quite nicely but they are in the same condition or similar condition each time he comes in. He claims to not be walking much on these feet it is difficult to believe that looking at this condition of the wound on arrival to clinic Integumentary (Hair, Skin) Wound #21 status is Open. Original cause of wound was Gradually Appeared. The wound is located on the [place]. The wound measures 0.5cm length x 2cm width x 0.8cm depth; 0.785cm^2 area and 0.628cm^3 volume. There is Fat Layer (Subcutaneous Tissue) Exposed exposed. There is no tunneling or undermining noted. There is a medium amount of serosanguineous drainage noted. The wound margin is thickened. There is large (67-100%) red, pink granulation within the wound bed. There is no necrotic tissue within the wound bed. Wound #22 status is Open. Original cause of wound was Gradually Appeared. The wound is located on the Right Metatarsal head first. The wound measures 1cm length x 1cm width x 0.6cm depth; 0.785cm^2 area and 0.471cm^3 volume. There is Fat Layer (Subcutaneous Tissue) Exposed exposed. There is no tunneling or undermining noted. There is a medium amount of serosanguineous drainage noted. The wound margin is thickened. There is large (67-100%) red, pink granulation within the wound bed. There is no necrotic tissue within the wound bed. Assessment Active Problems ICD-10 Type 2 diabetes mellitus with foot ulcer Non-pressure chronic ulcer of other part of left foot with fat layer exposed Non-pressure chronic ulcer of other part of right foot with fat layer exposed Other specified peripheral vascular diseases Nicotine dependence, cigarettes, with other  nicotine-induced disorders Essential (primary) hypertension Corns and callosities Procedures Wound #21 Pre-procedure diagnosis of Wound #21 is a Diabetic Wound/Ulcer of the Lower Extremity located on the [place] .Severity of  Tissue Pre Debridement is: Fat layer exposed. There was a Excisional Skin/Subcutaneous Tissue Debridement with a total area of 2 sq cm performed by Ricard Dillon., MD. With the following instrument(s): Curette to remove Viable and Non-Viable tissue/material. Material removed includes Callus, Subcutaneous Tissue, Skin: Dermis, and Fibrin/Exudate after achieving pain control using Lidocaine 4% Topical Solution. A time out was conducted at 08:45, prior to the start of the procedure. A Moderate amount of bleeding was controlled with Silver Nitrate. The procedure was tolerated well with a pain level of 0 throughout and a pain level of 0 following the procedure. Post Debridement Measurements: 0.5cm length x 2cm width x 0.8cm depth; 0.628cm^3 volume. Character of Wound/Ulcer Post Debridement is improved. Severity of Tissue Post Debridement is: Fat layer exposed. 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 There was a Excisional Skin/Subcutaneous Tissue Debridement with a total area of 2.25 sq cm performed by Ricard Dillon., MD. With the following instrument(s): Curette to remove Viable and Non-Viable tissue/material. Material removed includes Callus, Subcutaneous Tissue, Skin: Dermis, and Fibrin/Exudate after achieving pain control using Lidocaine 4% Topical Solution. A time out was conducted at 08:45, prior to the start of the procedure. A Moderate amount of bleeding was controlled with Silver Nitrate. The procedure was tolerated well with a pain level of 0  throughout and a pain level of 0 following the procedure. Post Debridement Measurements: 1cm length x 1cm width x 0.5cm depth; 0.393cm^3 volume. Character of Wound/Ulcer Post Debridement is improved. Severity of Tissue Post Debridement is: Fat layer exposed. Post procedure Diagnosis Wound #22: Same as Pre-Procedure Plan Follow-up Appointments: Return Appointment in 2 weeks. - Thursday Dressing Change Frequency: Change dressing three times week. Wound Cleansing: Clean wound with Wound Cleanser May shower with protection. Primary Wound Dressing: Wound #21 Left,Plantar Foot: Hydrofera Blue Wound #22 Right Metatarsal head first: Hydrofera Blue Secondary Dressing: Wound #21 Left,Plantar Foot: Foam - foam donut to offload Kerlix/Rolled Gauze Dry Gauze Wound #22 Right Metatarsal head first: Foam - foam donut to offload Kerlix/Rolled Gauze Dry Gauze Edema Control: Avoid standing for long periods of time Elevate legs to the level of the heart or above for 30 minutes daily and/or when sitting, a frequency of: - throughout the day. Off-Loading: Other: - felt patient's personal shoes. patient to wear diabetic shoes. patient to limit about of time spent walking and standing. Home Health: [facility] for [facility]. - [facility] 1. I change the primary dressing to [facility] although I am not of the opinion that that is likely the cause of the nonprogressing state. I think this is an offloading issue 2. We had issue with infection about 3 weeks ago but I see none of that currently. 3. I do not think there is a way to more aggressively offload this i.e. total contact cast Electronic Signature(s) Signed: 10/21/2019 5:30:42 PM By: Linton Ham MD Entered By: Linton Ham on 10/21/2019 09:05:07 -------------------------------------------------------------------------------- SuperBill Details Patient Name: Date of Service: [name], [name] 10/21/2019 Medical  Record Number: [id number] Patient Account Number: [id number] Date of Birth/Sex: Treating RN: 1940-09-01 (79 y.o. 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 with fat layer exposed I73.89 Other specified peripheral vascular diseases F17.218 Nicotine dependence, cigarettes, with other nicotine-induced disorders I10 Essential (primary) hypertension L84 Corns and callosities Facility Procedures The patient participates with Medicare or their insurance follows the Medicare Facility Guidelines: CPT4 Code Description Modifier Quantity [id number] - DEB SUBQ TISSUE 20 SQ CM/< 1 ICD-10 Diagnosis Description L97.512 Non-pressure chronic ulcer of  other part of right foot with fat layer exposed L97.522 Non-pressure chronic ulcer of other part of left foot with fat layer exposed Physician Procedures : CPT4 Code Description Modifier [id number] - WC PHYS SUBQ TISS 20 SQ CM ICD-10 Diagnosis Description L97.512 Non-pressure chronic ulcer of other part of right foot with fat layer exposed L97.522 Non-pressure chronic ulcer of other part of left foot  with fat layer exposed Quantity: 1 Electronic Signature(s) Signed: 10/21/2019 5:30:42 PM By: Linton Ham MD Entered By: Linton Ham on 10/21/2019 09:05:19

## 2019-11-08 NOTE — Progress Notes (Signed)
OLLY, BURKHARD ([id number]) Visit Report for 10/14/2019 Debridement Details Patient Name: Date of Service: [name], [place] 10/14/2019 8:45 A M Medical Record Number: [id number] Patient Account Number: [id number] Date of Birth/Sex: Treating RN: 03-Jul-1940 (79 y.o. [name] Primary Care Provider: [name] Other Clinician: Referring Provider: Treating Provider/Extender: Gabriel Rainwater in Treatment: 12 Debridement Performed for Assessment: Wound #21 [place] Performed By: Physician Ricard Dillon., MD Debridement Type: Debridement Severity of Tissue Pre Debridement: Fat layer exposed Level of Consciousness (Pre-procedure): Awake and Alert Pre-procedure Verification/Time Out Yes - 09:40 Taken: Start Time: 09:41 Pain Control: Lidocaine 4% T opical Solution T Area Debrided (L x W): otal 1 (cm) x 1 (cm) = 1 (cm) Tissue and other material debrided: Viable, Non-Viable, Callus, Skin: Dermis Level: Skin/Dermis Debridement Description: Selective/Open Wound Instrument: Curette Bleeding: Minimum End Time: 09:43 Procedural Pain: 0 Post Procedural Pain: 0 Response to Treatment: Procedure was tolerated well Level of Consciousness (Post- Awake and Alert procedure): Post Debridement Measurements of Total Wound Length: (cm) 0.5 Width: (cm) 0.2 Depth: (cm) 0.3 Volume: (cm) 0.024 Character of Wound/Ulcer Post Debridement: Improved Severity of Tissue Post Debridement: Fat layer exposed Post Procedure Diagnosis Same as Pre-procedure Electronic Signature(s) Signed: 10/14/2019 5:41:26 PM By: Linton Ham MD Signed: 11/08/2019 1:30:32 PM By: Deon Pilling Entered By: Linton Ham on 10/14/2019 09:59:49 -------------------------------------------------------------------------------- Debridement Details Patient Name: Date of Service: [name] RE, [name] 10/14/2019 8:45 A M Medical Record Number: [id number] Patient Account Number:  [id number] Date of Birth/Sex: Treating RN: [birth year] (79 y.o. [name], Meta.Reding Primary Care Provider: [name] Other Clinician: Referring Provider: Treating Provider/Extender: Gabriel Rainwater in Treatment: 12 Debridement Performed for Assessment: Wound #22 Right Metatarsal head first Performed By: Physician Ricard Dillon., MD Debridement Type: Debridement Severity of Tissue Pre Debridement: Fat layer exposed Level of Consciousness (Pre-procedure): Awake and Alert Pre-procedure Verification/Time Out Yes - 09:40 Taken: Start Time: 09:41 Pain Control: Lidocaine 4% T opical Solution T Area Debrided (L x W): otal 1 (cm) x 1.5 (cm) = 1.5 (cm) Tissue and other material debrided: Viable, Non-Viable, Callus, Skin: Dermis Level: Skin/Dermis Debridement Description: Selective/Open Wound Instrument: Curette Bleeding: Minimum End Time: 09:43 Procedural Pain: 0 Post Procedural Pain: 0 Response to Treatment: Procedure was tolerated well Level of Consciousness (Post- Awake and Alert procedure): Post Debridement Measurements of Total Wound Length: (cm) 0.8 Width: (cm) 1 Depth: (cm) 0.7 Volume: (cm) 0.44 Character of Wound/Ulcer Post Debridement: Improved Severity of Tissue Post Debridement: Fat layer exposed Post Procedure Diagnosis Same as Pre-procedure Electronic Signature(s) Signed: 10/14/2019 5:41:26 PM By: Linton Ham MD Signed: 11/08/2019 1:30:32 PM By: Deon Pilling Entered By: Linton Ham on 10/14/2019 10:00:02 -------------------------------------------------------------------------------- HPI Details Patient Name: Date of Service: [name] RE, [name] 10/14/2019 8:45 A M Medical Record Number: [id number] Patient Account Number: [id number] Date of Birth/Sex: Treating RN: 10-22-1940 (79 y.o. [name] Primary Care Provider: [name] Other Clinician: Referring Provider: Treating Provider/Extender: Gabriel Rainwater in Treatment: 12 History of Present Illness HPI Description: very pleasant 79 year old gentleman has type 2 diabetes and has been having an ulcer on his foot for several years. Been seeing as this time around since the end of October. Classified as a Wagner grade 2 because his previous x-rays did not show any problems. He has an MRI pending this Friday. discussing with him he says he has not been able to get his diabetic shoes yet. He continues to  smoke his pipe and drinks moonshine and says he will not give this up. I have tried to counsel him regarding this but he firmly says that he is happy to listen to me but he is not going to give up his habits. 08/17/14 -- patient has no fresh complaints but has recently come with his MRI which was done on 08/12/2014. 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 Readmission: 07/21/2019 upon evaluation today patient presents for initial inspection here in our clinic concerning issues that he has been having with his bilateral feet. He has open wounds that been present at least since the beginning of the year although he does not know an exact time. He is previously had amputations of all toes and the distal portion of his foot. 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 With that being said he does have a history of diabetes, peripheral vascular disease, nicotine dependence, and hypertension. He has not had any recent arterial or vascular studies he was noncompressible today I think he is going to require referral for arterial studies as well. 2/11; patient was readmitted to our clinic last week. He has bilateral plantar foot wounds in the setting of previous transmetatarsal amputations. He had his arterial studies done on 2/8; these were actually quite good. He was noncompressible on both sides however  his waveforms were triphasic. The thought was noncompressible vessels consistent with medial calcification but without significant stenosis in the lower extremities. X-rays were done on the left this showed extensive chronic and postoperative changes without evidence of osteomyelitis. There was no evidence of osteomyelitis on the right foot there was mild vascular calcification MRI was suggested on the left based on clinical correlation. The patient arrives in clinic with some odor and drainage from the left foot. Right foot measures smaller. He is really not offloading these areas although he says he does not walk much 2/25; the patient arrives with some odor and drainage from the left foot again. Post debridement I cultured this area. He has the area on the midfoot roughly the third metatarsal head and the first metatarsal head on the right. He is in surgical shoes bilateral transmet 3/4; culture from last week showed abundant group B strep and a few Proteus. We will put him on cephalexin starting today. Still silver alginate to the wounds 3/11; due to issues with pharmacy he still does not have the cephalexin that I prescribed a week ago. Apparently this was called into a different pharmacy on Friday but they have not delivered it to him. We have been using silver alginate on the wounds but because of lack of moisture change to silver collagen today. We are offloading as best we can in surgical shoes 3/18; he is taking the cephalexin that I prescribed 2 weeks ago. Using silver collagen. He has surgical shoes. Setting of bilateral previous transmetatarsal amputations. Wounds are mirror-image wounds medially. 3/25; mirror-image wounds in the setting of previous transmetatarsal amputation. The wounds are mirror image medial foot wounds. We have been using silver collagen. We are using surgical shoes offloaded with felt. He came in today asking about his "$900" custom-made shoes although after talking  to them it does not appear that he ever really wore these and they are more than a year old 4/8; mirror-image wounds on the medial part of the plantar feet in the setting of previous transmetatarsal amputations. We have been  using silver collagen. I aggressively debrided these 2 weeks ago removing callus subcutaneous tissue from around the wound margins. He did indeed bring echo he has custom-made shoes with transmetatarsal inserts. He has never worn these. He claims to be not spending a lot of time on his feet 4/15; mirror-image wounds on the plantar feet in the setting of previous transmetatarsal amputation. We have been using silver alginate. Culture that I did last time grew MRSA and Proteus. I have him on doxycycline. This was a deep tissue culture. The Proteus was not actively plated against doxycycline but was otherwise pansensitive I think the doxycycline would cover this. His wounds actually looks somewhat better. Rolled senescent edges still around the wound edges 4/29; mirror-image wounds on the plantar feet in the setting of previous bilateral transmetatarsal amputations we have been using silver alginate. Last time I saw him 2 weeks ago I gave him a course of doxycycline. Things seem to have cleaned up. This is in response to a deep tissue culture that showed MRSA and Proteus Electronic Signature(s) Signed: 10/14/2019 5:41:26 PM By: Linton Ham MD Entered By: Linton Ham on 10/14/2019 10:00:54 -------------------------------------------------------------------------------- Physical Exam Details Patient Name: Date of Service: [name] RE, [name] 10/14/2019 8:45 A M Medical Record Number: [id number] Patient Account Number: [id number] Date of Birth/Sex: Treating RN: [birth year] (79 y.o. [name] Primary Care Provider: [name] Other Clinician: Referring Provider: Treating Provider/Extender: Gabriel Rainwater in Treatment:  12 Constitutional Sitting or standing Blood Pressure is within target range for patient.. Pulse regular and within target range for patient.Marland Kitchen Respirations regular, non-labored and within target range.. Temperature is normal and within the target range for the patient.Marland Kitchen Appears in no distress. Notes Wound exam Both wounds have the same senescent edges. Used a #3 curette to debride skin and subcutaneous tissue. On the left he is left with a small vertical slit but there is depth but no undermining on the other side there is still significant surface area to this wound. No evidence of infection in either area Electronic Signature(s) Signed: 10/14/2019 5:41:26 PM By: Linton Ham MD Entered By: Linton Ham on 10/14/2019 10:01:46 -------------------------------------------------------------------------------- Physician Orders Details Patient Name: Date of Service: [name] RE, [name] 10/14/2019 8:45 A M Medical Record Number: [id number] Patient Account Number: [id number] Date of Birth/Sex: Treating RN: [birth year] (79 y.o. [name] Primary Care Provider: [name] Other Clinician: Referring Provider: Treating Provider/Extender: Gabriel Rainwater in Treatment: 12 Verbal / Phone Orders: No Diagnosis Coding ICD-10 Coding Code Description E11.621 Type 2 diabetes mellitus with foot ulcer L97.522 Non-pressure chronic ulcer of other part of left foot with fat layer exposed L97.512 Non-pressure chronic ulcer of other part of right foot with fat layer exposed I73.89 Other specified peripheral vascular diseases F17.218 Nicotine dependence, cigarettes, with other nicotine-induced disorders I10 Essential (primary) hypertension L84 Corns and callosities Follow-up Appointments ppointment in 1 week. - Thursday Return A Dressing Change Frequency Change dressing three times week. Wound Cleansing Clean wound with Wound Cleanser May shower with  protection. Primary Wound Dressing Wound #21 Left,Plantar Foot Calcium Alginate with Silver Wound #22 Right Metatarsal head first Calcium Alginate with Silver Secondary Dressing Wound #21 Left,Plantar Foot Foam - foam donut to offload Kerlix/Rolled Gauze Dry Gauze Wound #22 Right Metatarsal head first Foam - foam donut to offload Kerlix/Rolled Gauze Dry Gauze Edema Control Avoid standing for long periods of time Elevate legs to the level of the heart or above for  30 minutes daily and/or when sitting, a frequency of: - throughout the day. Off-Loading Other: - felt patient's personal shoes. patient to wear diabetic shoes. patient to limit about of time spent walking and standing. [facility] for wound care. - [facility], maintenance) Signed: 10/14/2019 5:33:37 PM By: Deon Pilling Signed: 10/14/2019 5:41:26 PM By: Linton Ham MD Entered By: Deon Pilling on 10/14/2019 09:46:46 -------------------------------------------------------------------------------- Problem List Details Patient Name: Date of Service: [name] RE, [name] 10/14/2019 8:45 A M Medical Record Number: [id number] Patient Account Number: [id number] Date of Birth/Sex: Treating RN: 1941-05-24 (79 y.o. M) Primary Care Provider: [name] Other Clinician: Referring Provider: Treating Provider/Extender: Ouida Sills in Treatment: 12 Active Problems ICD-10 Encounter Code Description Active Date MDM Diagnosis E11.621 Type 2 diabetes mellitus with foot ulcer 07/21/2019 No Yes L97.522 Non-pressure chronic ulcer of other part of left foot with fat layer exposed 07/21/2019 No Yes L97.512 Non-pressure chronic ulcer of other part of right foot with fat layer exposed 07/21/2019 No Yes I73.89 Other specified peripheral vascular diseases 07/21/2019 No Yes F17.218 Nicotine dependence, cigarettes, with other nicotine-induced disorders 07/21/2019 No Yes I10  Essential (primary) hypertension 07/21/2019 No Yes L84 Corns and callosities 07/21/2019 No Yes Inactive Problems Resolved Problems Electronic Signature(s) Signed: 10/14/2019 5:41:26 PM By: Linton Ham MD Entered By: Linton Ham on 10/14/2019 09:59:27 -------------------------------------------------------------------------------- Progress Note Details Patient Name: Date of Service: [name] RE, [name] 10/14/2019 8:45 A M Medical Record Number: [id number] Patient Account Number: [id number] Date of Birth/Sex: Treating RN: September 20, 1940 (79 y.o. [name] Primary Care Provider: [name] Other Clinician: Referring Provider: Treating Provider/Extender: Gabriel Rainwater in Treatment: 12 Subjective History of Present Illness (HPI) very pleasant 79 year old gentleman has type 2 diabetes and has been having an ulcer on his foot for several years. Been seeing as this time around since the end of October. Classified as a Wagner grade 2 because his previous x-rays did not show any problems. He has an MRI pending this Friday. discussing with him he says he has not been able to get his diabetic shoes yet. He continues to smoke his pipe and drinks moonshine and says he will not give this up. I have tried to counsel him regarding this but he firmly says that he is happy to listen to me but he is not going to give up his habits. 08/17/14 -- patient has no fresh complaints but has recently come with his MRI which was done on 08/12/2014. 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 Readmission: 07/21/2019 upon evaluation today patient presents for initial inspection here in our clinic concerning issues that he has been having with his bilateral feet. He has open wounds that been present at least since the beginning of the year although he does not know an exact time.  He is previously had amputations of all toes and the distal portion of his foot. 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 With that being said he does have a history of diabetes, peripheral vascular disease, nicotine dependence, and hypertension. He has not had any recent arterial or vascular studies he was noncompressible today I think he is going to require referral for arterial studies as well. 2/11; patient was readmitted to our clinic last week. He has bilateral plantar foot wounds in the setting of previous transmetatarsal amputations. He had his arterial studies  done on 2/8; these were actually quite good. He was noncompressible on both sides however his waveforms were triphasic. The thought was noncompressible vessels consistent with medial calcification but without significant stenosis in the lower extremities. X-rays were done on the left this showed extensive chronic and postoperative changes without evidence of osteomyelitis. There was no evidence of osteomyelitis on the right foot there was mild vascular calcification MRI was suggested on the left based on clinical correlation. The patient arrives in clinic with some odor and drainage from the left foot. Right foot measures smaller. He is really not offloading these areas although he says he does not walk much 2/25; the patient arrives with some odor and drainage from the left foot again. Post debridement I cultured this area. He has the area on the midfoot roughly the third metatarsal head and the first metatarsal head on the right. He is in surgical shoes bilateral transmet 3/4; culture from last week showed abundant group B strep and a few Proteus. We will put him on cephalexin starting today. Still silver alginate to the wounds 3/11; due to issues with pharmacy he still does not have the cephalexin that I prescribed a week ago. Apparently this was called into a different pharmacy on Friday but they have not  delivered it to him. We have been using silver alginate on the wounds but because of lack of moisture change to silver collagen today. We are offloading as best we can in surgical shoes 3/18; he is taking the cephalexin that I prescribed 2 weeks ago. Using silver collagen. He has surgical shoes. Setting of bilateral previous transmetatarsal amputations. Wounds are mirror-image wounds medially. 3/25; mirror-image wounds in the setting of previous transmetatarsal amputation. The wounds are mirror image medial foot wounds. We have been using silver collagen. We are using surgical shoes offloaded with felt. He came in today asking about his "$900" custom-made shoes although after talking to them it does not appear that he ever really wore these and they are more than a year old 4/8; mirror-image wounds on the medial part of the plantar feet in the setting of previous transmetatarsal amputations. We have been using silver collagen. I aggressively debrided these 2 weeks ago removing callus subcutaneous tissue from around the wound margins. He did indeed bring echo he has custom-made shoes with transmetatarsal inserts. He has never worn these. He claims to be not spending a lot of time on his feet 4/15; mirror-image wounds on the plantar feet in the setting of previous transmetatarsal amputation. We have been using silver alginate. Culture that I did last time grew MRSA and Proteus. I have him on doxycycline. This was a deep tissue culture. The Proteus was not actively plated against doxycycline but was otherwise pansensitive I think the doxycycline would cover this. His wounds actually looks somewhat better. Rolled senescent edges still around the wound edges 4/29; mirror-image wounds on the plantar feet in the setting of previous bilateral transmetatarsal amputations we have been using silver alginate. Last time I saw him 2 weeks ago I gave him a course of doxycycline. Things seem to have cleaned up. This  is in response to a deep tissue culture that showed MRSA and Proteus Objective Constitutional Sitting or standing Blood Pressure is within target range for patient.. Pulse regular and within target range for patient.Marland Kitchen Respirations regular, non-labored and within target range.. Temperature is normal and within the target range for the patient.Marland Kitchen Appears in no distress. Vitals Time Taken: 9:15 AM, Height: 73  in, Weight: 180 lbs, BMI: 23.7, Temperature: 98.8 F, Pulse: 65 bpm, Respiratory Rate: 19 breaths/min, Blood Pressure: 136/72 mmHg. General Notes: Wound exam ooBoth wounds have the same senescent edges. Used a #3 curette to debride skin and subcutaneous tissue. On the left he is left with a small vertical slit but there is depth but no undermining on the other side there is still significant surface area to this wound. No evidence of infection in either area Integumentary (Hair, Skin) Wound #21 status is Open. Original cause of wound was Gradually Appeared. The wound is located on the [place]. The wound measures 0.5cm length x 0.2cm width x 0.3cm depth; 0.079cm^2 area and 0.024cm^3 volume. There is Fat Layer (Subcutaneous Tissue) Exposed exposed. There is no tunneling or undermining noted. There is a medium amount of serosanguineous drainage noted. The wound margin is thickened. There is large (67-100%) red, pink granulation within the wound bed. There is no necrotic tissue within the wound bed. General Notes: callus noted. Wound #22 status is Open. Original cause of wound was Gradually Appeared. The wound is located on the Right Metatarsal head first. The wound measures 0.8cm length x 1cm width x 0.7cm depth; 0.628cm^2 area and 0.44cm^3 volume. There is Fat Layer (Subcutaneous Tissue) Exposed exposed. There is no tunneling or undermining noted. There is a medium amount of serosanguineous drainage noted. The wound margin is thickened. There is large (67-100%) red, pink granulation  within the wound bed. There is no necrotic tissue within the wound bed. Assessment Active Problems ICD-10 Type 2 diabetes mellitus with foot ulcer Non-pressure chronic ulcer of other part of left foot with fat layer exposed Non-pressure chronic ulcer of other part of right foot with fat layer exposed Other specified peripheral vascular diseases Nicotine dependence, cigarettes, with other nicotine-induced disorders Essential (primary) hypertension Corns and callosities Procedures Wound #21 Pre-procedure diagnosis of Wound #21 is a Diabetic Wound/Ulcer of the Lower Extremity located on the [place] .Severity of Tissue Pre Debridement is: Fat layer exposed. There was a Selective/Open Wound Skin/Dermis Debridement with a total area of 1 sq cm performed by Ricard Dillon., MD. With the following instrument(s): Curette to remove Viable and Non-Viable tissue/material. Material removed includes Callus and Skin: Dermis and after achieving pain control using Lidocaine 4% T opical Solution. A time out was conducted at 09:40, prior to the start of the procedure. A Minimum amount of bleeding was controlled with N/A. The procedure was tolerated well with a pain level of 0 throughout and a pain level of 0 following the procedure. Post Debridement Measurements: 0.5cm length x 0.2cm width x 0.3cm depth; 0.024cm^3 volume. Character of Wound/Ulcer Post Debridement is improved. Severity of Tissue Post Debridement is: Fat layer exposed. 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 There was a Selective/Open Wound Skin/Dermis Debridement with a total area of 1.5 sq cm performed by Ricard Dillon., MD. With the following instrument(s): Curette to remove Viable and Non-Viable tissue/material.  Material removed includes Callus and Skin: Dermis and after achieving pain control using Lidocaine 4% Topical Solution. A time out was conducted at 09:40, prior to the start of the procedure. A Minimum amount of bleeding was controlled with N/A. The procedure was tolerated well with a pain level of 0 throughout and a pain level of 0 following the procedure.  Post Debridement Measurements: 0.8cm length x 1cm width x 0.7cm depth; 0.44cm^3 volume. Character of Wound/Ulcer Post Debridement is improved. Severity of Tissue Post Debridement is: Fat layer exposed. Post procedure Diagnosis Wound #22: Same as Pre-Procedure Plan Follow-up Appointments: Return Appointment in 1 week. - Thursday Dressing Change Frequency: Change dressing three times week. Wound Cleansing: Clean wound with Wound Cleanser May shower with protection. Primary Wound Dressing: Wound #21 Left,Plantar Foot: Calcium Alginate with Silver Wound #22 Right Metatarsal head first: Calcium Alginate with Silver Secondary Dressing: Wound #21 Left,Plantar Foot: Foam - foam donut to offload Kerlix/Rolled Gauze Dry Gauze Wound #22 Right Metatarsal head first: Foam - foam donut to offload Kerlix/Rolled Gauze Dry Gauze Edema Control: Avoid standing for long periods of time Elevate legs to the level of the heart or above for 30 minutes daily and/or when sitting, a frequency of: - throughout the day. Off-Loading: Other: - felt patient's personal shoes. patient to wear diabetic shoes. patient to limit about of time spent walking and standing. Home Health: [facility] for wound care. - [facility] 1. Continue with silver alginate to both wound areas 2. We are offloading this with felt in his shoes which are fitted for his amputated foot. 3. He has home health/[facility] Electronic Signature(s) Signed: 10/14/2019 5:41:26 PM By: Linton Ham MD Entered By: Linton Ham on 10/14/2019  10:02:45 -------------------------------------------------------------------------------- SuperBill Details Patient Name: Date of Service: [name] RE, [name] 10/14/2019 Medical Record Number: [id number] Patient Account Number: [id number] Date of Birth/Sex: Treating RN: [birth year] (79 y.o. 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, cigarettes, with other nicotine-induced disorders I10 Essential (primary) hypertension L84 Corns and callosities Facility Procedures The patient participates with Medicare or their insurance follows the Medicare Facility Guidelines: CPT4 Code Description Modifier Quantity [id number] - DEBRIDE WOUND 1ST 20 SQ CM OR < 1 ICD-10 Diagnosis Description L97.522 Non-pressure chronic ulcer  of other part of left foot with fat layer exposed L97.512 Non-pressure chronic ulcer of other part of right foot with fat layer exposed Physician Procedures : CPT4 Code Description Modifier [id number] - WC PHYS DEBR WO ANESTH 20 SQ CM ICD-10 Diagnosis Description L97.522 Non-pressure chronic ulcer of other part of left foot with fat layer exposed L97.512 Non-pressure chronic ulcer of other part of right  foot with fat layer exposed Quantity: 1 Electronic Signature(s) Signed: 10/14/2019 5:41:26 PM By: Linton Ham MD Entered By: Linton Ham on 10/14/2019 10:02:59

## 2019-11-08 NOTE — Progress Notes (Signed)
STEFANOS, HAYNESWORTH ([id number]) Visit Report for 10/14/2019 Arrival Information Details Patient Name: Date of Service: [name], [place] ND 10/14/2019 8:45 A M Medical Record Number: [id number] Patient Account Number: [id number] Date of Birth/Sex: Treating RN: 01/01/1941 (79 y.o. M) Primary Care [name]: [name] Other Clinician: Referring [name]: Treating [name]/Extender: [name] in Treatment: 12 Visit Information History Since Last Visit Added or deleted any medications: No Patient Arrived: Cane Any new allergies or adverse reactions: No Arrival Time: 09:15 Had a fall or experienced change in No Accompanied By: self activities of daily living that may affect Transfer Assistance: None risk of falls: Patient Identification Verified: Yes Signs or symptoms of abuse/neglect since last visito No Secondary Verification Process Completed: Yes Hospitalized since last visit: No Patient Requires Transmission-Based Precautions: No Implantable device outside of the clinic excluding No Patient Has Alerts: Yes cellular tissue based products placed in the center Patient Alerts: R ABI non compressible since last visit: L ABI non compressible Has Dressing in Place as Prescribed: Yes Pain Present Now: Yes Electronic Signature(s) Signed: 10/27/2019 9:09:16 AM By: Sandre Kitty Entered By: Sandre Kitty on 10/14/2019 09:15:23 -------------------------------------------------------------------------------- Encounter Discharge Information Details Patient Name: Date of Service: [name] RE, [name] 10/14/2019 8:45 A M Medical Record Number: [id number] Patient Account Number: [id number] Date of Birth/Sex: Treating RN: [birth year] (79 y.o. [name] Primary Care Aylah Yeary: [name] Other Clinician: Referring [name]: Treating [name]/Extender: [name] in Treatment: 12 Encounter Discharge Information Items  Post Procedure Vitals Discharge Condition: Stable Temperature (F): 98.8 Ambulatory Status: Cane Pulse (bpm): 65 Discharge Destination: Home Respiratory Rate (breaths/min): 18 Transportation: Private Auto Blood Pressure (mmHg): 136/72 Accompanied By: self Schedule Follow-up Appointment: Yes Clinical Summary of Care: Patient Declined Electronic Signature(s) Signed: 10/14/2019 5:04:41 PM By: Baruch Gouty RN, BSN Entered By: Baruch Gouty on 10/14/2019 10:08:04 -------------------------------------------------------------------------------- Lower Extremity Assessment Details Patient Name: Date of Service: [name] RE, [name] 10/14/2019 8:45 A M Medical Record Number: [id number] Patient Account Number: [id number] Date of Birth/Sex: Treating RN: [birth year] (79 y.o. M) Primary Care Niveah Boerner: [name] Other Clinician: Referring [name]: Treating [name]/Extender: [name] in Treatment: 12 Edema Assessment Assessed: [Left: Yes] [Right: Yes] Edema: [Left: Yes] [Right: Yes] Calf Left: Right: Point of Measurement: 32 cm From Medial Instep 30 cm 34 cm Ankle Left: Right: Point of Measurement: 10 cm From Medial Instep 23 cm 25.5 cm Vascular Assessment Pulses: Dorsalis Pedis Palpable: [Left:Yes] [Right:Yes] Electronic Signature(s) Signed: 10/14/2019 5:33:37 PM By: Deon Pilling Entered By: Deon Pilling on 10/14/2019 09:34:47 -------------------------------------------------------------------------------- Multi Wound Chart Details Patient Name: Date of Service: [name] RE, [name] 10/14/2019 8:45 A M Medical Record Number: [id number] Patient Account Number: [id number] Date of Birth/Sex: Treating RN: November 17, 1940 (79 y.o. [name], [name] Primary Care [name]: [name] Other Clinician: Referring [name]: Treating [name]/Extender: [name] in Treatment: 12 Vital Signs Height(in):  73 Pulse(bpm): 65 Weight(lbs): 180 Blood Pressure(mmHg): 136/72 Body Mass Index(BMI): 24 Temperature(F): 98.8 Respiratory Rate(breaths/min): 19 Photos: [21:No Photos Left, Plantar Foot] [22:No Photos Right Metatarsal head first] [N/A:N/A N/A] Wound Location: [21:Gradually Appeared] [22:Gradually Appeared] [N/A:N/A] Wounding Event: [21:Diabetic Wound/Ulcer of the Lower] [22:Diabetic Wound/Ulcer of the Lower] [N/A:N/A] Primary Etiology: [21:Extremity Cataracts, Glaucoma, Hypertension,] [22:Extremity Cataracts, Glaucoma, Hypertension,] [N/A:N/A] Comorbid History: [21:Peripheral Arterial Disease, Type II Diabetes, Osteomyelitis, Neuropathy 06/17/2018] [22:Peripheral Arterial Disease, Type II Diabetes, Osteomyelitis, Neuropathy 06/17/2018] [N/A:N/A] Date Acquired: [21:12] [22:12] [N/A:N/A] Weeks of Treatment: [21:Open] [22:Open] [N/A:N/A] Wound Status: [21:0.5x0.2x0.3] [22:0.8x1x0.7] [N/A:N/A]  Measurements L x W x D (cm) [82:9.937] [22:0.628] [N/A:N/A] A (cm) : rea [16:9.678] [22:0.44] [N/A:N/A] Volume (cm) : [21:96.60%] [22:0.00%] [N/A:N/A] % Reduction in A rea: [21:97.90%] [22:-134.00%] [N/A:N/A] % Reduction in Volume: [21:Grade 2] [22:Grade 2] [N/A:N/A] Classification: [21:Medium] [22:Medium] [N/A:N/A] Exudate A mount: [21:Serosanguineous] [22:Serosanguineous] [N/A:N/A] Exudate Type: [21:red, brown] [22:red, brown] [N/A:N/A] Exudate Color: [21:Thickened] [22:Thickened] [N/A:N/A] Wound Margin: [21:Large (67-100%)] [22:Large (67-100%)] [N/A:N/A] Granulation A mount: [21:Red, Pink] [22:Red, Pink] [N/A:N/A] Granulation Quality: [21:None Present (0%)] [22:None Present (0%)] [N/A:N/A] Necrotic A mount: [21:Fat Layer (Subcutaneous Tissue)] [22:Fat Layer (Subcutaneous Tissue)] [N/A:N/A] Exposed Structures: [21:Exposed: Yes Fascia: No Tendon: No Muscle: No Joint: No Bone: No None] [22:Exposed: Yes Fascia: No Tendon: No Muscle: No Joint: No Bone: No None] [N/A:N/A] Epithelialization:  [21:Debridement - Selective/Open Wound] [22:Debridement - Selective/Open Wound] [N/A:N/A] Debridement: Pre-procedure Verification/Time Out 09:40 [22:09:40] [N/A:N/A] Taken: [21:Lidocaine 4% Topical Solution] [22:Lidocaine 4% Topical Solution] [N/A:N/A] Pain Control: [21:Callus] [22:Callus] [N/A:N/A] Tissue Debrided: [21:Skin/Dermis] [22:Skin/Dermis] [N/A:N/A] Level: [21:1] [22:1.5] [N/A:N/A] Debridement A (sq cm): [21:rea Curette] [22:Curette] [N/A:N/A] Instrument: [21:Minimum] [22:Minimum] [N/A:N/A] Bleeding: [21:0] [22:0] [N/A:N/A] Procedural Pain: [21:0] [22:0] [N/A:N/A] Post Procedural Pain: [21:Procedure was tolerated well] [22:Procedure was tolerated well] [N/A:N/A] Debridement Treatment Response: [21:0.5x0.2x0.3] [22:0.8x1x0.7] [N/A:N/A] Post Debridement Measurements L x W x D (cm) [21:0.024] [22:0.44] [N/A:N/A] Post Debridement Volume: (cm) [21:callus noted.] [22:N/A] [N/A:N/A] Assessment Notes: [21:Debridement] [22:Debridement] [N/A:N/A] Treatment Notes Electronic Signature(s) Signed: 10/14/2019 5:41:26 PM By: Linton Ham MD Signed: 11/08/2019 1:30:32 PM By: Deon Pilling Entered By: Linton Ham on 10/14/2019 09:59:36 -------------------------------------------------------------------------------- Multi-Disciplinary Care Plan Details Patient Name: Date of Service: [name] RE, [name] 10/14/2019 8:45 A M Medical Record Number: [id number] Patient Account Number: [id number] Date of Birth/Sex: Treating RN: May 28, 1941 (79 y.o. M) Primary Care Zelma Mazariego: [name] Other Clinician: Referring Hermenia Fritcher: Treating Martika Egler/Extender: [name] in Treatment: 12 Active Inactive Abuse / Safety / Falls / Self Care Management Nursing Diagnoses: Potential for falls Goals: Patient/caregiver will verbalize/demonstrate measures taken to prevent injury and/or falls Date Initiated: 07/21/2019 Target Resolution Date: 11/12/2019 Goal Status:  Active Interventions: Assess fall risk on admission and as needed Assess impairment of mobility on admission and as needed per policy Notes: Nutrition Nursing Diagnoses: Impaired glucose control: actual or potential Potential for alteratiion in Nutrition/Potential for imbalanced nutrition Goals: Patient/caregiver will maintain therapeutic glucose control Date Initiated: 07/21/2019 Target Resolution Date: 11/12/2019 Goal Status: Active Interventions: Assess HgA1c results as ordered upon admission and as needed Assess patient nutrition upon admission and as needed per policy Provide education on elevated blood sugars and impact on wound healing Treatment Activities: Patient referred to Primary Care Physician for further nutritional evaluation : 07/21/2019 Notes: Wound/Skin Impairment Nursing Diagnoses: Impaired tissue integrity Knowledge deficit related to smoking impact on wound healing Knowledge deficit related to ulceration/compromised skin integrity Goals: Patient will demonstrate a reduced rate of smoking or cessation of smoking Date Initiated: 07/21/2019 Target Resolution Date: 11/12/2019 Goal Status: Active Patient/caregiver will verbalize understanding of skin care regimen Date Initiated: 07/21/2019 Date Inactivated: 07/29/2019 Target Resolution Date: 08/18/2019 Goal Status: Met Ulcer/skin breakdown will have a volume reduction of 30% by week 4 Date Initiated: 07/21/2019 Date Inactivated: 09/23/2019 Target Resolution Date: 09/17/2019 Unmet Reason: see wound Goal Status: Unmet measurements. Interventions: Assess patient/caregiver ability to obtain necessary supplies Assess patient/caregiver ability to perform ulcer/skin care regimen upon admission and as needed Assess ulceration(s) every visit Provide education on smoking Provide education on ulcer and skin care Treatment Activities: Skin care regimen initiated : 07/21/2019 Topical wound management initiated :  07/21/2019 Notes: Electronic Signature(s) Signed: 10/14/2019 5:33:37 PM By: Deon Pilling Entered By: Deon Pilling on 10/14/2019 09:28:03 -------------------------------------------------------------------------------- Pain Assessment Details Patient Name: Date of Service: [name] RE, [name] 10/14/2019 8:45 A M Medical Record Number: [id number] Patient Account Number: [id number] Date of Birth/Sex: Treating RN: [birth year] (79 y.o. M) Primary Care Densel Kronick: [name] Other Clinician: Referring Rex Oesterle: Treating Kaeleb Emond/Extender: [name] in Treatment: 12 Active Problems Location of Pain Severity and Description of Pain Patient Has Paino Yes Site Locations Rate the pain. Current Pain Level: 4 Pain Management and Medication Current Pain Management: Electronic Signature(s) Signed: 10/27/2019 9:09:16 AM By: Sandre Kitty Entered By: Sandre Kitty on 10/14/2019 09:18:33 -------------------------------------------------------------------------------- Patient/Caregiver Education Details Patient Name: Date of Service: [name] RE, [name] 4/29/2021andnbsp8:45 A M Medical Record Number: [id number] Patient Account Number: [id number] Date of Birth/Gender: Treating RN: Jun 08, 1941 (79 y.o. M) Primary Care Physician: [name] Other Clinician: Referring Physician: Treating Physician/Extender: [name] in Treatment: 12 Education Assessment Education Provided To: Patient Education Topics Provided Elevated Blood Sugar/ Impact on Healing: Handouts: Elevated Blood Sugars: How Do They Affect Wound Healing Methods: Explain/Verbal Responses: Reinforcements needed Electronic Signature(s) Signed: 10/14/2019 5:33:37 PM By: Deon Pilling Entered By: Deon Pilling on 10/14/2019 09:28:16 -------------------------------------------------------------------------------- Wound Assessment Details Patient Name: Date of Service: [name] RE, [name] 10/14/2019 8:45 A M Medical Record Number: [id number] Patient Account Number: [id number] Date of Birth/Sex: Treating RN: February 02, 1941 (79 y.o. M) Primary Care Riely Oetken: [name] Other Clinician: Referring Zariya Minner: Treating Alajia Schmelzer/Extender: [name] in Treatment: 12 Wound Status Wound Number: [id number] Primary Diabetic Wound/Ulcer of the Lower Extremity Etiology: Wound Location: Left, Plantar Foot Wound Open Wounding Event: Gradually Appeared Status: Date Acquired: 06/17/2018 Comorbid Cataracts, Glaucoma, Hypertension, Peripheral Arterial Disease, Weeks Of Treatment: 12 History: Type II Diabetes, Osteomyelitis, Neuropathy Clustered Wound: No Photos Photo Uploaded By: Mikeal Hawthorne on 10/15/2019 11:29:36 Wound Measurements Length: (cm) 0.5 Width: (cm) 0.2 Depth: (cm) 0.3 Area: (cm) 0.079 Volume: (cm) 0.024 % Reduction in Area: 96.6% % Reduction in Volume: 97.9% Epithelialization: None Tunneling: No Undermining: No Wound Description Classification: Grade 2 Wound Margin: Thickened Exudate Amount: Medium Exudate Type: Serosanguineous Exudate Color: red, brown Foul Odor After Cleansing: No Slough/Fibrino No Wound Bed Granulation Amount: Large (67-100%) Exposed Structure Granulation Quality: Red, Pink Fascia Exposed: No Necrotic Amount: None Present (0%) Fat Layer (Subcutaneous Tissue) Exposed: Yes Tendon Exposed: No Muscle Exposed: No Joint Exposed: No Bone Exposed: No Assessment Notes callus noted. Electronic Signature(s) Signed: 10/14/2019 5:33:37 PM By: Deon Pilling Entered By: Deon Pilling on 10/14/2019 09:35:45 -------------------------------------------------------------------------------- Wound Assessment Details Patient Name: Date of Service: [name], [name] 10/14/2019 8:45 A M Medical Record Number: [id number] Patient Account Number: [id number] Date of Birth/Sex: Treating RN: [birth year] (79 y.o. M) Primary  Care Severina Sykora: [name] Other Clinician: Referring Uva Runkel: Treating Daya Dutt/Extender: [name] in Treatment: 12 Wound Status Wound Number: 22 Primary Diabetic Wound/Ulcer of the Lower Extremity Etiology: Wound Location: Right Metatarsal head first Wound Open Wounding Event: Gradually Appeared Status: Date Acquired: 06/17/2018 Comorbid Cataracts, Glaucoma, Hypertension, Peripheral Arterial Disease, Weeks Of Treatment: 12 History: Type II Diabetes, Osteomyelitis, Neuropathy Clustered Wound: No Photos Photo Uploaded By: Mikeal Hawthorne on 10/15/2019 11:29:36 Wound Measurements Length: (cm) 0.8 Width: (cm) 1 Depth: (cm) 0.7 Area: (cm) 0.628 Volume: (cm) 0.44 % Reduction in Area: 0% % Reduction in Volume: -134% Epithelialization: None Tunneling: No Undermining: No Wound Description Classification: Grade 2 Wound Margin: Thickened Exudate Amount: Medium Exudate Type:  Serosanguineous Exudate Color: red, brown Foul Odor After Cleansing: No Slough/Fibrino No Wound Bed Granulation Amount: Large (67-100%) Exposed Structure Granulation Quality: Red, Pink Fascia Exposed: No Necrotic Amount: None Present (0%) Fat Layer (Subcutaneous Tissue) Exposed: Yes Tendon Exposed: No Muscle Exposed: No Joint Exposed: No Bone Exposed: No Electronic Signature(s) Signed: 10/14/2019 5:33:37 PM By: Deon Pilling Entered By: Deon Pilling on 10/14/2019 09:36:03 -------------------------------------------------------------------------------- Vitals Details Patient Name: Date of Service: [name] RE, [name] 10/14/2019 8:45 A M Medical Record Number: [id number] Patient Account Number: [id number] Date of Birth/Sex: Treating RN: 1940-08-30 (79 y.o. M) Primary Care Maebry Obrien: [name] Other Clinician: Referring Valin Massie: Treating Mcguire Gasparyan/Extender: [name] in Treatment: 12 Vital Signs Time Taken: 09:15 Temperature (F):  98.8 Height (in): 73 Pulse (bpm): 65 Weight (lbs): 180 Respiratory Rate (breaths/min): 19 Body Mass Index (BMI): 23.7 Blood Pressure (mmHg): 136/72 Reference Range: 80 - 120 mg / dl Electronic Signature(s) Signed: 10/27/2019 9:09:16 AM By: Sandre Kitty Entered By: Sandre Kitty on 10/14/2019 09:18:19

## 2019-11-08 NOTE — Progress Notes (Signed)
[name] ([id number]) Visit Report for 09/30/2019 Arrival Information Details Patient Name: Date of Service: [name], [place] [name] 09/30/2019 8:15 A M Medical Record Number: [id number] Patient Account Number: [id number] Date of Birth/Sex: Treating RN: 1940/09/27 (79 y.o. [name], Meta.Reding Primary Care Kejon Feild: [name] Other Clinician: Referring Harrison Paulson: Treating Bennette Hasty/Extender: Gabriel Rainwater in Treatment: 10 Visit Information History Since Last Visit Added or deleted any medications: No Patient Arrived: Cane Any new allergies or adverse reactions: No Arrival Time: 07:58 Had a fall or experienced change in No Accompanied By: self activities of daily living that may affect Transfer Assistance: None risk of falls: Patient Identification Verified: Yes Signs or symptoms of abuse/neglect since last visito No Secondary Verification Process Completed: Yes Hospitalized since last visit: No Patient Requires Transmission-Based Precautions: No Implantable device outside of the clinic excluding No Patient Has Alerts: Yes cellular tissue based products placed in the center Patient Alerts: R ABI non compressible since last visit: L ABI non compressible Has Dressing in Place as Prescribed: Yes Pain Present Now: No Electronic Signature(s) Signed: 10/05/2019 1:29:28 PM By: Sandre Kitty Entered By: Sandre Kitty on 09/30/2019 07:59:14 -------------------------------------------------------------------------------- Clinic Level of Care Assessment Details Patient Name: Date of Service: [name] [name], [name] 09/30/2019 8:15 A M Medical Record Number: [id number] Patient Account Number: [id number] Date of Birth/Sex: Treating RN: January 01, 1941 (79 y.o. [name] Primary Care Odalis Jordan: [name] Other Clinician: Referring Merek Niu: Treating Harjit Douds/Extender: Gabriel Rainwater in Treatment: 10 Clinic  Level of Care Assessment Items TOOL 4 Quantity Score X- 1 0 Use when only an EandM is performed on FOLLOW-UP visit ASSESSMENTS - Nursing Assessment / Reassessment X- 1 10 Reassessment of Co-morbidities (includes updates in patient status) X- 1 5 Reassessment of Adherence to Treatment Plan ASSESSMENTS - Wound and Skin A ssessment / Reassessment _0  - 0 Simple Wound Assessment / Reassessment - one wound X- 2 5 Complex Wound Assessment / Reassessment - multiple wounds X- 1 10 Dermatologic / Skin Assessment (not related to wound area) ASSESSMENTS - Focused Assessment X- 2 5 Circumferential Edema Measurements - multi extremities X- 1 10 Nutritional Assessment / Counseling / Intervention _1  - 0 Lower Extremity Assessment (monofilament, tuning fork, pulses) _2  - 0 Peripheral Arterial Disease Assessment (using hand held doppler) ASSESSMENTS - Ostomy and/or Continence Assessment and Care _3  - 0 Incontinence Assessment and Management _4  - 0 Ostomy Care Assessment and Management (repouching, etc.) PROCESS - Coordination of Care _5  - 0 Simple Patient / Family Education for ongoing care X- 1 20 Complex (extensive) Patient / Family Education for ongoing care X- 1 10 Staff obtains Programmer, systems, Records, T Results / Process Orders est X- 1 10 Staff telephones HHA, Nursing Homes / Clarify orders / etc _6  - 0 Routine Transfer to another Facility (non-emergent condition) _7  - 0 Routine Hospital Admission (non-emergent condition) _8  - 0 New Admissions / Biomedical engineer / Ordering NPWT Apligraf, etc. , _9  - 0 Emergency Hospital Admission (emergent condition) _10  - 0 Simple Discharge Coordination X- 1 15 Complex (extensive) Discharge Coordination PROCESS - Special Needs _11  - 0 Pediatric / Minor Patient Management _12  - 0 Isolation Patient Management _13  - 0 Hearing / Language / Visual special needs _14  - 0 Assessment of Community assistance (transportation, D/C planning,  etc.) _15  - 0 Additional assistance / Altered mentation _16  - 0 Support Surface(s) Assessment (bed, cushion, seat, etc.) INTERVENTIONS - Wound Cleansing / Measurement _17  - 0 Simple Wound Cleansing - one  wound X- 2 5 Complex Wound Cleansing - multiple wounds X- 1 5 Wound Imaging (photographs - any number of wounds) _0  - 0 Wound Tracing (instead of photographs) _1  - 0 Simple Wound Measurement - one wound X- 2 5 Complex Wound Measurement - multiple wounds INTERVENTIONS - Wound Dressings _2  - 0 Small Wound Dressing one or multiple wounds X- 2 15 Medium Wound Dressing one or multiple wounds _3  - 0 Large Wound Dressing one or multiple wounds <[id number]>_6<\/BWGYKZLDJTTSVXBL>_3  - 0 Application of Medications - topical <JQZESPQZRAQTMAUQ>_3<\/FHLKTGYBWLSLHTDS>_2  - 0 Application of Medications - injection INTERVENTIONS - Miscellaneous _6  - 0 External ear exam _7  - 0 Specimen Collection (cultures, biopsies, blood, body fluids, etc.) _8  - 0 Specimen(s) / Culture(s) sent or taken to Lab for analysis _9  - 0 Patient Transfer (multiple staff / Civil Service fast streamer / Similar devices) _10  - 0 Simple Staple / Suture removal (25 or less) _11  - 0 Complex Staple / Suture removal (26 or more) _12  - 0 Hypo / Hyperglycemic Management (close monitor of Blood Glucose) _13  - 0 Ankle / Brachial Index (ABI) - do not check if billed separately X- 1 5 Vital Signs Has the patient been seen at the hospital within the last three years: Yes Total Score: 170 Level Of Care: New/Established - Level 5 Electronic Signature(s) Signed: 10/01/2019 4:22:27 PM By: Levan Hurst RN, BSN Entered By: Levan Hurst on 09/30/2019 08:24:44 -------------------------------------------------------------------------------- Encounter Discharge Information Details Patient Name: Date of Service: [name], [name] 09/30/2019 8:15 A M Medical Record Number: [id number] Patient Account Number: [id number] Date of Birth/Sex: Treating RN: [birth year] (79 y.o. [name] Primary Care Cali Hope:  [name] Other Clinician: Referring Elanna Bert: Treating Keerstin Bjelland/Extender: Gabriel Rainwater in Treatment: 10 Encounter Discharge Information Items Discharge Condition: Stable Ambulatory Status: Cane Discharge Destination: Home Transportation: Private Auto Accompanied By: self Schedule Follow-up Appointment: Yes Clinical Summary of Care: Patient Declined Electronic Signature(s) Signed: 09/30/2019 4:28:12 PM By: Baruch Gouty RN, BSN Entered By: Baruch Gouty on 09/30/2019 08:43:04 -------------------------------------------------------------------------------- Lower Extremity Assessment Details Patient Name: Date of Service: [name], [name] 09/30/2019 8:15 A M Medical Record Number: [id number] Patient Account Number: [id number] Date of Birth/Sex: Treating RN: [birth year] (79 y.o. [name] Primary Care Braydyn Schultes: [name] Other Clinician: Referring Camy Leder: Treating Ryzen Deady/Extender: Gabriel Rainwater in Treatment: 10 Edema Assessment Assessed: [name]: No] Patrice Paradise: No] Edema: [Left: No] [Right: No] Calf Left: Right: Point of Measurement: 32 cm From Medial Instep 32 cm 36 cm Ankle Left: Right: Point of Measurement: 10 cm From Medial Instep 23 cm 24.5 cm Vascular Assessment Pulses: Dorsalis Pedis Palpable: [Left:Yes] [Right:Yes] Electronic Signature(s) Signed: 10/01/2019 4:21:34 PM By: Kela Millin Entered By: Kela Millin on 09/30/2019 08:06:44 -------------------------------------------------------------------------------- Multi Wound Chart Details Patient Name: Date of Service: [name], [name] 09/30/2019 8:15 A M Medical Record Number: [id number] Patient Account Number: [id number] Date of Birth/Sex: Treating RN: [birth year] (79 y.o. [name] Primary Care Burlene Montecalvo: [name] Other Clinician: Referring Iqra Rotundo: Treating Tewana Bohlen/Extender:  Gabriel Rainwater in Treatment: 10 Vital Signs Height(in): 73 Pulse(bpm): 39 Weight(lbs): 180 Blood Pressure(mmHg): 129/62 Body Mass Index(BMI): 24 Temperature(F): 98.2 Respiratory Rate(breaths/min): 19 Photos: [21:No Photos Left, Plantar Foot] [22:No Photos Right Metatarsal head first] [N/A:N/A N/A] Wound Location: [21:Gradually Appeared] [22:Gradually Appeared] [N/A:N/A] Wounding Event: [21:Diabetic Wound/Ulcer of the Lower] [22:Diabetic Wound/Ulcer of the Lower] [N/A:N/A] Primary Etiology: [21:Extremity Cataracts, Glaucoma, Hypertension,] [22:Extremity Cataracts, Glaucoma, Hypertension,] [N/A:N/A] Comorbid History: [21:Peripheral Arterial Disease, Type II  Diabetes, Osteomyelitis, Neuropathy 06/17/2018] [22:Peripheral Arterial Disease, Type II Diabetes, Osteomyelitis, Neuropathy 06/17/2018] [N/A:N/A] Date Acquired: [21:10] [22:10] [N/A:N/A] Weeks of Treatment: [21:Open] [22:Open] [N/A:N/A] Wound Status: [21:0.7x0.6x0.3] [22:0.7x0.7x0.4] [N/A:N/A] Measurements L x W x D (cm) [21:0.33] [22:0.385] [N/A:N/A] A (cm) : rea [21:0.099] [22:0.154] [N/A:N/A] Volume (cm) : [21:85.70%] [22:38.70%] [N/A:N/A] % Reduction in A rea: [21:91.40%] [22:18.10%] [N/A:N/A] % Reduction in Volume: [22:1] Starting Position 1 (o'clock): [22:4] Ending Position 1 (o'clock): [22:0.5] Maximum Distance 1 (cm): [21:No] [22:Yes] [N/A:N/A] Undermining: [21:Grade 2] [22:Grade 2] [N/A:N/A] Classification: [21:Medium] [22:Medium] [N/A:N/A] Exudate A mount: [21:Serosanguineous] [22:Serosanguineous] [N/A:N/A] Exudate Type: [21:red, brown] [22:red, brown] [N/A:N/A] Exudate Color: [21:Thickened] [22:Thickened] [N/A:N/A] Wound Margin: [21:Large (67-100%)] [22:Large (67-100%)] [N/A:N/A] Granulation A mount: [21:Red, Pink] [22:Red, Pink] [N/A:N/A] Granulation Quality: [21:None Present (0%)] [22:None Present (0%)] [N/A:N/A] Necrotic A mount: [21:Fat Layer (Subcutaneous Tissue)] [22:Fat Layer  (Subcutaneous Tissue)] [N/A:N/A] Exposed Structures: [21:Exposed: Yes Fascia: No Tendon: No Muscle: No Joint: No Bone: No None] [22:Exposed: Yes Fascia: No Tendon: No Muscle: No Joint: No Bone: No None] [N/A:N/A] Treatment Notes Wound #21 (Left, Plantar Foot) 2. Periwound Care Moisturizing lotion 3. Primary Dressing Applied Calcium Alginate Ag 4. Secondary Dressing Dry Gauze Roll Gauze Foam 7. Footwear/Offloading device applied Diabetic shoe Wound #22 (Right Metatarsal head first) 2. Periwound Care Moisturizing lotion 3. Primary Dressing Applied Calcium Alginate Ag 4. Secondary Dressing Dry Gauze Roll Gauze Foam 7. Footwear/Offloading device applied Diabetic shoe Electronic Signature(s) Signed: 10/02/2019 6:55:41 AM By: Linton Ham MD Signed: 11/08/2019 1:31:05 PM By: Deon Pilling Entered By: Linton Ham on 09/30/2019 08:45:05 -------------------------------------------------------------------------------- Multi-Disciplinary Care Plan Details Patient Name: Date of Service: [name], [name] 09/30/2019 8:15 A M Medical Record Number: [id number] Patient Account Number: [id number] Date of Birth/Sex: Treating RN: 1940-09-21 (79 y.o. [name] Primary Care Jazen Spraggins: [name] Other Clinician: Referring Cire Clute: Treating Mauricia Mertens/Extender: Gabriel Rainwater in Treatment: 10 Active Inactive Abuse / Safety / Falls / Self Care Management Nursing Diagnoses: Potential for falls Goals: Patient/caregiver will verbalize/demonstrate measures taken to prevent injury and/or falls Date Initiated: 07/21/2019 Target Resolution Date: 10/15/2019 Goal Status: Active Interventions: Assess fall risk on admission and as needed Assess impairment of mobility on admission and as needed per policy Notes: Nutrition Nursing Diagnoses: Impaired glucose control: actual or potential Potential for alteratiion in Nutrition/Potential for  imbalanced nutrition Goals: Patient/caregiver will maintain therapeutic glucose control Date Initiated: 07/21/2019 Target Resolution Date: 10/15/2019 Goal Status: Active Interventions: Assess HgA1c results as ordered upon admission and as needed Assess patient nutrition upon admission and as needed per policy Provide education on elevated blood sugars and impact on wound healing Treatment Activities: Patient referred to Primary Care Physician for further nutritional evaluation : 07/21/2019 Notes: Wound/Skin Impairment Nursing Diagnoses: Impaired tissue integrity Knowledge deficit related to smoking impact on wound healing Knowledge deficit related to ulceration/compromised skin integrity Goals: Patient will demonstrate a reduced rate of smoking or cessation of smoking Date Initiated: 07/21/2019 Target Resolution Date: 10/15/2019 Goal Status: Active Patient/caregiver will verbalize understanding of skin care regimen Date Initiated: 07/21/2019 Date Inactivated: 07/29/2019 Target Resolution Date: 08/18/2019 Goal Status: Met Ulcer/skin breakdown will have a volume reduction of 30% by week 4 Date Initiated: 07/21/2019 Date Inactivated: 09/23/2019 Target Resolution Date: 09/17/2019 Unmet Reason: see wound Goal Status: Unmet measurements. Interventions: Assess patient/caregiver ability to obtain necessary supplies Assess patient/caregiver ability to perform ulcer/skin care regimen upon admission and as needed Assess ulceration(s) every visit Provide education on smoking Provide education on ulcer and skin care Treatment Activities: Skin  care regimen initiated : 07/21/2019 Topical wound management initiated : 07/21/2019 Notes: Electronic Signature(s) Signed: 10/01/2019 4:22:27 PM By: Levan Hurst RN, BSN Entered By: Levan Hurst on 09/30/2019 08:12:00 -------------------------------------------------------------------------------- Pain Assessment Details Patient Name: Date of Service: [name],  [name] 09/30/2019 8:15 A M Medical Record Number: [id number] Patient Account Number: [id number] Date of Birth/Sex: Treating RN: 06-11-1941 (79 y.o. [name] Primary Care Lorey Pallett: [name] Other Clinician: Referring Haylo Fake: Treating Jahlen Bollman/Extender: Gabriel Rainwater in Treatment: 10 Active Problems Location of Pain Severity and Description of Pain Patient Has Paino No Site Locations Pain Management and Medication Current Pain Management: Electronic Signature(s) Signed: 10/05/2019 1:29:28 PM By: Sandre Kitty Signed: 11/08/2019 1:31:05 PM By: Deon Pilling Entered By: Sandre Kitty on 09/30/2019 08:01:36 -------------------------------------------------------------------------------- Patient/Caregiver Education Details Patient Name: Date of Service: [name], [name] 4/15/2021andnbsp8:15 A M Medical Record Number: [id number] Patient Account Number: [id number] Date of Birth/Gender: Treating RN: [birth year] (79 y.o. [name] Primary Care Physician: [name] Other Clinician: Referring Physician: Treating Physician/Extender: Gabriel Rainwater in Treatment: 10 Education Assessment Education Provided To: Patient Education Topics Provided Wound/Skin Impairment: Handouts: Skin Care Do's and Dont's Methods: Explain/Verbal Responses: Reinforcements needed Electronic Signature(s) Signed: 10/01/2019 4:22:27 PM By: Levan Hurst RN, BSN Entered By: Levan Hurst on 09/30/2019 08:12:15 -------------------------------------------------------------------------------- Wound Assessment Details Patient Name: Date of Service: [name], [name] 09/30/2019 8:15 A M Medical Record Number: [id number] Patient Account Number: [id number] Date of Birth/Sex: Treating RN: [birth year] (79 y.o. [name] Primary Care Sergey Ishler: [name] Other Clinician: Referring  Peri Kreft: Treating Bernardino Dowell/Extender: Gabriel Rainwater in Treatment: 10 Wound Status Wound Number: 21 Primary Diabetic Wound/Ulcer of the Lower Extremity Etiology: Wound Location: Left, Plantar Foot Wound Open Wounding Event: Gradually Appeared Status: Date Acquired: 06/17/2018 Comorbid Cataracts, Glaucoma, Hypertension, Peripheral Arterial Disease, Weeks Of Treatment: 10 History: Type II Diabetes, Osteomyelitis, Neuropathy Clustered Wound: No Photos Wound Measurements Length: (cm) 0.7 Width: (cm) 0.6 Depth: (cm) 0.3 Area: (cm) 0.33 Volume: (cm) 0.099 % Reduction in Area: 85.7% % Reduction in Volume: 91.4% Epithelialization: None Tunneling: No Undermining: No Wound Description Classification: Grade 2 Wound Margin: Thickened Exudate Amount: Medium Exudate Type: Serosanguineous Exudate Color: red, brown Foul Odor After Cleansing: No Slough/Fibrino No Wound Bed Granulation Amount: Large (67-100%) Exposed Structure Granulation Quality: Red, Pink Fascia Exposed: No Necrotic Amount: None Present (0%) Fat Layer (Subcutaneous Tissue) Exposed: Yes Tendon Exposed: No Muscle Exposed: No Joint Exposed: No Bone Exposed: No Electronic Signature(s) Signed: 10/01/2019 4:21:34 PM By: Kela Millin Signed: 10/05/2019 1:29:28 PM By: Sandre Kitty Entered By: Sandre Kitty on 09/30/2019 14:50:24 -------------------------------------------------------------------------------- Wound Assessment Details Patient Name: Date of Service: [name], [name] 09/30/2019 8:15 A M Medical Record Number: [id number] Patient Account Number: [id number] Date of Birth/Sex: Treating RN: 1940-11-22 (79 y.o. [name] Primary Care Sharon Stapel: [name] Other Clinician: Referring Liron Eissler: Treating Debborah Alonge/Extender: Gabriel Rainwater in Treatment: 10 Wound Status Wound Number: 22 Primary Diabetic Wound/Ulcer of  the Lower Extremity Etiology: Etiology: Wound Location: Right Metatarsal head first Wound Open Wounding Event: Gradually Appeared Status: Date Acquired: 06/17/2018 Comorbid Cataracts, Glaucoma, Hypertension, Peripheral Arterial Disease, Weeks Of Treatment: 10 History: Type II Diabetes, Osteomyelitis, Neuropathy Clustered Wound: No Photos Wound Measurements Length: (cm) 0.7 Width: (cm) 0.7 Depth: (cm) 0.4 Area: (cm) 0.385 Volume: (cm) 0.154 % Reduction in Area: 38.7% % Reduction in Volume: 18.1% Epithelialization: None Tunneling: No Undermining: Yes Starting Position (  o'clock): 1 Ending Position (o'clock): 4 Maximum Distance: (cm) 0.5 Wound Description Classification: Grade 2 Wound Margin: Thickened Exudate Amount: Medium Exudate Type: Serosanguineous Exudate Color: red, brown Foul Odor After Cleansing: No Slough/Fibrino No Wound Bed Granulation Amount: Large (67-100%) Exposed Structure Granulation Quality: Red, Pink Fascia Exposed: No Necrotic Amount: None Present (0%) Fat Layer (Subcutaneous Tissue) Exposed: Yes Tendon Exposed: No Muscle Exposed: No Joint Exposed: No Bone Exposed: No Electronic Signature(s) Signed: 10/01/2019 4:21:34 PM By: Kela Millin Signed: 10/05/2019 1:29:28 PM By: Sandre Kitty Entered By: Sandre Kitty on 09/30/2019 14:49:57 -------------------------------------------------------------------------------- Vitals Details Patient Name: Date of Service: [name], [name] 09/30/2019 8:15 A M Medical Record Number: [id number] Patient Account Number: [id number] Date of Birth/Sex: Treating RN: [birth year] (79 y.o. [name] Primary Care Merrily Tegeler: [name] Other Clinician: Referring Nargis Abrams: Treating Olon Russ/Extender: Gabriel Rainwater in Treatment: 10 Vital Signs Time Taken: 07:59 Temperature (F): 98.2 Height (in): 73 Pulse (bpm): 58 Weight (lbs): 180 Respiratory Rate  (breaths/min): 19 Body Mass Index (BMI): 23.7 Blood Pressure (mmHg): 129/62 Reference Range: 80 - 120 mg / dl Electronic Signature(s) Signed: 10/05/2019 1:29:28 PM By: Sandre Kitty Entered By: Sandre Kitty on 09/30/2019 08:01:31

## 2019-11-08 NOTE — Progress Notes (Signed)
Casey, PANGILINAN ([id number]) Visit Report for 10/21/2019 Arrival Information Details Patient Name: Date of Service: [name], [place] 10/21/2019 8:15 A M Medical Record Number: [id number] Patient Account Number: [id number] Date of Birth/Sex: Treating RN: [birth year] (79 y.o. [name]) [name] Primary Care Westley Blass: [name] Other Clinician: Referring Edmond Ginsberg: Treating Americo Vallery/Extender: Gabriel Rainwater in Treatment: 13 Visit Information History Since Last Visit All ordered tests and consults were completed: No Patient Arrived: [name] Added or deleted any medications: No Arrival Time: 08:33 Any new allergies or adverse reactions: No Accompanied By: self Had a fall or experienced change in No Transfer Assistance: None activities of daily living that may affect Patient Identification Verified: Yes risk of falls: Secondary Verification Process Completed: Yes Signs or symptoms of abuse/neglect since last visito No Patient Requires Transmission-Based Precautions: No Hospitalized since last visit: No Patient Has Alerts: Yes Implantable device outside of the clinic excluding No Patient Alerts: R ABI non compressible cellular tissue based products placed in the center L ABI non compressible since last visit: Has Dressing in Place as Prescribed: Yes Pain Present Now: No Electronic Signature(s) Signed: 10/21/2019 5:04:33 PM By: [name] RN Entered By: [name] on 10/21/2019 08:36:25 -------------------------------------------------------------------------------- Encounter Discharge Information Details Patient Name: Date of Service: [name], [name] 10/21/2019 8:15 A M Medical Record Number: [id number] Patient Account Number: [id number] Date of Birth/Sex: Treating RN: 03/06/1941 (79 y.o. [name] Primary Care Ardelia Wrede: [name] Other Clinician: Referring Jia Mohamed: Treating Zali Kamaka/Extender: Gabriel Rainwater in Treatment: 13 Encounter Discharge Information Items Post Procedure Vitals Discharge Condition: Stable Temperature (F): 98.4 Ambulatory Status: Cane Pulse (bpm): 66 Discharge Destination: Home Respiratory Rate (breaths/min): 18 Transportation: Other Blood Pressure (mmHg): 154/69 Accompanied By: self Schedule Follow-up Appointment: Yes Clinical Summary of Care: Patient Declined Notes taxi Electronic Signature(s) Signed: 10/21/2019 5:01:19 PM By: Baruch Gouty RN, BSN Entered By: Baruch Gouty on 10/21/2019 09:20:08 -------------------------------------------------------------------------------- Lower Extremity Assessment Details Patient Name: Date of Service: [name], [name] 10/21/2019 8:15 A M Medical Record Number: [id number] Patient Account Number: [id number] Date of Birth/Sex: Treating RN: 1941/01/31 (79 y.o. [name]) [name] Primary Care Ashkan Chamberland: [name] Other Clinician: Referring Evolett Somarriba: Treating Antwonette Feliz/Extender: Marlow Baars Weeks in Treatment: 13 Edema Assessment Assessed: [name]: No] [Right: No] Edema: [Left: Yes] [Right: Yes] Calf Left: Right: Point of Measurement: 32 cm From Medial Instep 30 cm 34 cm Ankle Left: Right: Point of Measurement: 10 cm From Medial Instep 23 cm 25.5 cm Electronic Signature(s) Signed: 10/21/2019 5:04:33 PM By: [name] RN Entered By: [name] on 10/21/2019 08:42:51 -------------------------------------------------------------------------------- Multi Wound Chart Details Patient Name: Date of Service: [name], [name] 10/21/2019 8:15 A M Medical Record Number: [id number] Patient Account Number: [id number] Date of Birth/Sex: Treating RN: [birth year] (79 y.o. [name], Meta.Reding Primary Care Ireene Ballowe: [name] Other Clinician: Referring Dereck Agerton: Treating Rickeya Manus/Extender: Gabriel Rainwater in Treatment: 13 Vital  Signs Height(in): 73 Pulse(bpm): 65 Weight(lbs): 180 Blood Pressure(mmHg): 154/69 Body Mass Index(BMI): 24 Temperature(F): 98.4 Respiratory Rate(breaths/min): 18 Photos: [21:No Photos Left, Plantar Foot] [22:No Photos Right Metatarsal head first] [N/A:N/A N/A] Wound Location: [21:Gradually Appeared] [22:Gradually Appeared] [N/A:N/A] Wounding Event: [21:Diabetic Wound/Ulcer of the Lower] [22:Diabetic Wound/Ulcer of the Lower] [N/A:N/A] Primary Etiology: [21:Extremity Cataracts, Glaucoma, Hypertension,] [22:Extremity Cataracts, Glaucoma, Hypertension,] [N/A:N/A] Comorbid History: [21:Peripheral Arterial Disease, Type II Diabetes, Osteomyelitis, Neuropathy 06/17/2018] [22:Peripheral Arterial Disease, Type II Diabetes, Osteomyelitis, Neuropathy 06/17/2018] [N/A:N/A] Date Acquired: [21:13] [22:13] [N/A:N/A]  Weeks of Treatment: [21:Open] [22:Open] [N/A:N/A] Wound Status: [21:0.5x2x0.8] [22:1x1x0.6] [N/A:N/A] Measurements L x W x D (cm) [95:2.841] [22:0.785] [N/A:N/A] A (cm) : rea [32:4.401] [22:0.471] [N/A:N/A] Volume (cm) : [21:66.00%] [22:-25.00%] [N/A:N/A] % Reduction in Area: [21:45.60%] [22:-150.50%] [N/A:N/A] % Reduction in Volume: [21:Grade 2] [22:Grade 2] [N/A:N/A] Classification: [21:Medium] [22:Medium] [N/A:N/A] Exudate A mount: [21:Serosanguineous] [22:Serosanguineous] [N/A:N/A] Exudate Type: [21:red, brown] [22:red, brown] [N/A:N/A] Exudate Color: [21:Thickened] [22:Thickened] [N/A:N/A] Wound Margin: [21:Large (67-100%)] [22:Large (67-100%)] [N/A:N/A] Granulation A mount: [21:Red, Pink] [22:Red, Pink] [N/A:N/A] Granulation Quality: [21:None Present (0%)] [22:None Present (0%)] [N/A:N/A] Necrotic A mount: [21:Fat Layer (Subcutaneous Tissue)] [22:Fat Layer (Subcutaneous Tissue)] [N/A:N/A] Exposed Structures: [21:Exposed: Yes Fascia: No Tendon: No Muscle: No Joint: No Bone: No None] [22:Exposed: Yes Fascia: No Tendon: No Muscle: No Joint: No Bone: No None]  [N/A:N/A] Epithelialization: [21:Debridement - Excisional] [22:Debridement - Excisional] [N/A:N/A] Debridement: Pre-procedure Verification/Time Out 08:45 [22:08:45] [N/A:N/A] Taken: [21:Lidocaine 4% T opical Solution] [22:Lidocaine 4% T opical Solution] [N/A:N/A] Pain Control: [21:Callus, Subcutaneous] [22:Callus, Subcutaneous] [N/A:N/A] Tissue Debrided: [21:Skin/Subcutaneous Tissue] [22:Skin/Subcutaneous Tissue] [N/A:N/A] Level: [21:2] [22:2.25] [N/A:N/A] Debridement A (sq cm): [21:rea Curette] [22:Curette] [N/A:N/A] Instrument: [21:Moderate] [22:Moderate] [N/A:N/A] Bleeding: [21:Silver Nitrate] [22:Silver Nitrate] [N/A:N/A] Hemostasis A chieved: [21:0] [22:0] [N/A:N/A] Procedural Pain: [21:0] [22:0] [N/A:N/A] Post Procedural Pain: [21:Procedure was tolerated well] [22:Procedure was tolerated well] [N/A:N/A] Debridement Treatment Response: [21:0.5x2x0.8] [22:1x1x0.5] [N/A:N/A] Post Debridement Measurements L x W x D (cm) [21:0.628] [02:7.253] [N/A:N/A] Post Debridement Volume: (cm) [21:Debridement] [22:Debridement] [N/A:N/A] Treatment Notes Electronic Signature(s) Signed: 10/21/2019 5:30:42 PM By: Linton Ham MD Signed: 11/08/2019 1:34:25 PM By: Deon Pilling Entered By: Linton Ham on 10/21/2019 08:58:54 -------------------------------------------------------------------------------- Multi-Disciplinary Care Plan Details Patient Name: Date of Service: [name], [name] 10/21/2019 8:15 A M Medical Record Number: [id number] Patient Account Number: [id number] Date of Birth/Sex: Treating RN: 1940-07-21 (79 y.o. [name] Primary Care Leanda Padmore: [name] Other Clinician: Referring Anes Rigel: Treating Terria Deschepper/Extender: Gabriel Rainwater in Treatment: 13 Active Inactive Abuse / Safety / Falls / Self Care Management Nursing Diagnoses: Potential for falls Goals: Patient/caregiver will verbalize/demonstrate measures taken to prevent  injury and/or falls Date Initiated: 07/21/2019 Target Resolution Date: 11/12/2019 Goal Status: Active Interventions: Assess fall risk on admission and as needed Assess impairment of mobility on admission and as needed per policy Notes: Nutrition Nursing Diagnoses: Impaired glucose control: actual or potential Potential for alteratiion in Nutrition/Potential for imbalanced nutrition Goals: Patient/caregiver will maintain therapeutic glucose control Date Initiated: 07/21/2019 Target Resolution Date: 11/12/2019 Goal Status: Active Interventions: Assess HgA1c results as ordered upon admission and as needed Assess patient nutrition upon admission and as needed per policy Provide education on elevated blood sugars and impact on wound healing Treatment Activities: Patient referred to Primary Care Physician for further nutritional evaluation : 07/21/2019 Notes: Wound/Skin Impairment Nursing Diagnoses: Impaired tissue integrity Knowledge deficit related to smoking impact on wound healing Knowledge deficit related to ulceration/compromised skin integrity Goals: Patient will demonstrate a reduced rate of smoking or cessation of smoking Date Initiated: 07/21/2019 Target Resolution Date: 11/12/2019 Goal Status: Active Patient/caregiver will verbalize understanding of skin care regimen Date Initiated: 07/21/2019 Date Inactivated: 07/29/2019 Target Resolution Date: 08/18/2019 Goal Status: Met Ulcer/skin breakdown will have a volume reduction of 30% by week 4 Date Initiated: 07/21/2019 Date Inactivated: 09/23/2019 Target Resolution Date: 09/17/2019 Unmet Reason: see wound Goal Status: Unmet measurements. Interventions: Assess patient/caregiver ability to obtain necessary supplies Assess patient/caregiver ability to perform ulcer/skin care regimen upon admission and as needed Assess ulceration(s) every visit Provide education on smoking Provide  education on ulcer and skin care Treatment  Activities: Skin care regimen initiated : 07/21/2019 Topical wound management initiated : 07/21/2019 Notes: Electronic Signature(s) Signed: 10/21/2019 5:34:26 PM By: Deon Pilling Signed: 11/08/2019 1:34:25 PM By: Deon Pilling Entered By: Deon Pilling on 10/21/2019 08:33:32 -------------------------------------------------------------------------------- Pain Assessment Details Patient Name: Date of Service: [name], [name] 10/21/2019 8:15 A M Medical Record Number: [id number] Patient Account Number: [id number] Date of Birth/Sex: Treating RN: [birth year] (78 y.o. [name]) [name] Primary Care Stepheny Canal: [name] Other Clinician: Referring Wallie Lagrand: Treating Afnan Cadiente/Extender: Marlow Baars Weeks in Treatment: 13 Active Problems Location of Pain Severity and Description of Pain Patient Has Paino No Site Locations Pain Management and Medication Current Pain Management: Electronic Signature(s) Signed: 10/21/2019 5:04:33 PM By: [name] RN Entered By: [name] on 10/21/2019 08:36:53 -------------------------------------------------------------------------------- Patient/Caregiver Education Details Patient Name: Date of Service: [name], [name] 5/6/2021andnbsp8:15 A M Medical Record Number: [id number] Patient Account Number: [id number] Date of Birth/Gender: Treating RN: [birth year] (79 y.o. [name] Primary Care Physician: [name] Other Clinician: Referring Physician: Treating Physician/Extender: Gabriel Rainwater in Treatment: 13 Education Assessment Education Provided To: Patient Education Topics Provided Elevated Blood Sugar/ Impact on Healing: Handouts: Elevated Blood Sugars: How Do They Affect Wound Healing Methods: Explain/Verbal Responses: Reinforcements needed Electronic Signature(s) Signed: 10/21/2019 5:34:26 PM By: Deon Pilling Entered By: Deon Pilling on 10/21/2019  08:34:30 -------------------------------------------------------------------------------- Wound Assessment Details Patient Name: Date of Service: [name], [name] 10/21/2019 8:15 A M Medical Record Number: [id number] Patient Account Number: [id number] Date of Birth/Sex: Treating RN: 1940-09-04 (78 y.o. [name]) [name] Primary Care Brice Kossman: [name] Other Clinician: Referring Meaghen Vecchiarelli: Treating Shalayah Beagley/Extender: Marlow Baars Weeks in Treatment: 13 Wound Status Wound Number: 21 Primary Diabetic Wound/Ulcer of the Lower Extremity Etiology: Wound Location: Left, Plantar Foot Wound Open Wounding Event: Gradually Appeared Status: Date Acquired: 06/17/2018 Comorbid Cataracts, Glaucoma, Hypertension, Peripheral Arterial Disease, Weeks Of Treatment: 13 History: Type II Diabetes, Osteomyelitis, Neuropathy Clustered Wound: No Photos Photo Uploaded By: Mikeal Hawthorne on 10/22/2019 08:27:31 Wound Measurements Length: (cm) 0.5 Width: (cm) 2 Depth: (cm) 0.8 Area: (cm) 0.785 Volume: (cm) 0.628 % Reduction in Area: 66% % Reduction in Volume: 45.6% Epithelialization: None Tunneling: No Undermining: No Wound Description Classification: Grade 2 Wound Margin: Thickened Exudate Amount: Medium Exudate Type: Serosanguineous Exudate Color: red, brown Foul Odor After Cleansing: No Slough/Fibrino No Wound Bed Granulation Amount: Large (67-100%) Exposed Structure Granulation Quality: Red, Pink Fascia Exposed: No Necrotic Amount: None Present (0%) Fat Layer (Subcutaneous Tissue) Exposed: Yes Tendon Exposed: No Muscle Exposed: No Joint Exposed: No Bone Exposed: No Treatment Notes Wound #21 (Left, Plantar Foot) 2. Periwound Care Moisturizing lotion 3. Primary Dressing Applied Hydrofera Blue 4. Secondary Dressing Dry Gauze Roll Gauze Foam 5. Secured With Other (specify in notes) 7. Footwear/Offloading device applied Diabetic  shoe Notes tubular netting Electronic Signature(s) Signed: 10/21/2019 5:04:33 PM By: [name] RN Entered By: [name] on 10/21/2019 08:43:31 -------------------------------------------------------------------------------- Wound Assessment Details Patient Name: Date of Service: [name], [name] 10/21/2019 8:15 A M Medical Record Number: [id number] Patient Account Number: [id number] Date of Birth/Sex: Treating RN: 1940/08/24 (78 y.o. [name]) [name] Primary Care [name]: [name] Other Clinician: Referring Jniya Madara: Treating Rajanae Mantia/Extender: Marlow Baars Weeks in Treatment: 13 Wound Status Wound Number: 22 Primary Diabetic Wound/Ulcer of the Lower Extremity Etiology: Wound Location: Right Metatarsal head first Wound Open Wounding Event: Gradually Appeared Status: Date Acquired:  06/17/2018 Comorbid Cataracts, Glaucoma, Hypertension, Peripheral Arterial Disease, Weeks Of Treatment: 13 History: Type II Diabetes, Osteomyelitis, Neuropathy Clustered Wound: No Photos Photo Uploaded By: Mikeal Hawthorne on 10/22/2019 08:26:37 Wound Measurements Length: (cm) 1 Width: (cm) 1 Depth: (cm) 0.6 Area: (cm) 0.785 Volume: (cm) 0.471 % Reduction in Area: -25% % Reduction in Volume: -150.5% Epithelialization: None Tunneling: No Undermining: No Wound Description Classification: Grade 2 Wound Margin: Thickened Exudate Amount: Medium Exudate Type: Serosanguineous Exudate Color: red, brown Foul Odor After Cleansing: No Slough/Fibrino No Wound Bed Granulation Amount: Large (67-100%) Exposed Structure Granulation Quality: Red, Pink Fascia Exposed: No Necrotic Amount: None Present (0%) Fat Layer (Subcutaneous Tissue) Exposed: Yes Tendon Exposed: No Muscle Exposed: No Joint Exposed: No Bone Exposed: No Treatment Notes Wound #22 (Right Metatarsal head first) 2. Periwound Care Moisturizing lotion 3. Primary Dressing Applied Hydrofera  Blue 4. Secondary Dressing Dry Gauze Roll Gauze Foam 5. Secured With Other (specify in notes) 7. Footwear/Offloading device applied Diabetic shoe Notes tubular netting Electronic Signature(s) Signed: 10/21/2019 5:04:33 PM By: [name] RN Entered By: [name] on 10/21/2019 08:43:51 -------------------------------------------------------------------------------- Vitals Details Patient Name: Date of Service: [name], [name] 10/21/2019 8:15 A M Medical Record Number: [id number] Patient Account Number: [id number] Date of Birth/Sex: Treating RN: Jul 10, 1940 (78 y.o. [name]) [name] Primary Care Brendolyn Stockley: [name] Other Clinician: Referring Alamin Mccuiston: Treating Tyjah Hai/Extender: Gabriel Rainwater in Treatment: 13 Vital Signs Time Taken: 08:36 Temperature (F): 98.4 Height (in): 73 Pulse (bpm): 66 Weight (lbs): 180 Respiratory Rate (breaths/min): 18 Body Mass Index (BMI): 23.7 Blood Pressure (mmHg): 154/69 Reference Range: 80 - 120 mg / dl Electronic Signature(s) Signed: 10/21/2019 5:04:33 PM By: [name] RN Entered By: [name] on 10/21/2019 08:36:47

## 2019-11-11 ENCOUNTER — Other Ambulatory Visit: Payer: Self-pay

## 2019-11-11 ENCOUNTER — Encounter (HOSPITAL_BASED_OUTPATIENT_CLINIC_OR_DEPARTMENT_OTHER): Payer: Medicare Other | Admitting: Internal Medicine

## 2019-11-11 ENCOUNTER — Other Ambulatory Visit (HOSPITAL_COMMUNITY): Payer: Self-pay | Admitting: Internal Medicine

## 2019-11-11 ENCOUNTER — Ambulatory Visit (HOSPITAL_COMMUNITY)
Admission: RE | Admit: 2019-11-11 | Discharge: 2019-11-11 | Disposition: A | Discharge status: Home / Self Care | Payer: Medicare Other | Source: Ambulatory Visit | Attending: Internal Medicine | Admitting: Internal Medicine

## 2019-11-11 ENCOUNTER — Other Ambulatory Visit (HOSPITAL_COMMUNITY): Admission: RE | Admit: 2019-11-11 | Payer: Medicare Other | Admitting: *Deleted

## 2019-11-11 DIAGNOSIS — E11621 Type 2 diabetes mellitus with foot ulcer: Secondary | ICD-10-CM | POA: Insufficient documentation

## 2019-11-11 DIAGNOSIS — M86172 Other acute osteomyelitis, left ankle and foot: Secondary | ICD-10-CM | POA: Insufficient documentation

## 2019-11-11 NOTE — Progress Notes (Addendum)
[name], [name] ([id number]) Visit Report for 11/11/2019 Debridement Details Patient Name: Date of Service: [name], [place] [name] 11/11/2019 8:15 A M Medical Record Number: [id number] Patient Account Number: [id number] Date of Birth/Sex: Treating RN: Oct 24, 1940 (78 y.o. [name] Primary Care Provider: [name] Other Clinician: Referring Provider: Treating Provider/Extender: Astrid Drafts Weeks in Treatment: 16 Debridement Performed for Assessment: Wound #21 Left,Plantar Foot Performed By: Physician Tobi Bastos, MD Debridement Type: Debridement Severity of Tissue Pre Debridement: Fat layer exposed Level of Consciousness (Pre-procedure): Awake and Alert Pre-procedure Verification/Time Out Yes - 09:00 Taken: Start Time: 09:01 Pain Control: Lidocaine 4% T opical Solution T Area Debrided (L x W): otal 0.5 (cm) x 0.5 (cm) = 0.25 (cm) Tissue and other material debrided: Non-Viable, Callus Level: Non-Viable Tissue Debridement Description: Selective/Open Wound Instrument: Blade, Forceps Bleeding: Minimum Hemostasis Achieved: Pressure End Time: 09:06 Procedural Pain: 0 Post Procedural Pain: 0 Response to Treatment: Procedure was tolerated well Level of Consciousness (Post- Awake and Alert procedure): Post Debridement Measurements of Total Wound Length: (cm) 0.3 Width: (cm) 0.3 Depth: (cm) 0.4 Volume: (cm) 0.028 Character of Wound/Ulcer Post Debridement: Requires Further Debridement Severity of Tissue Post Debridement: Fat layer exposed Post Procedure Diagnosis Same as Pre-procedure Electronic Signature(s) Signed: 11/11/2019 4:57:23 PM By: Tobi Bastos MD, MBA Signed: 11/11/2019 5:42:52 PM By: Deon Pilling Entered By: Deon Pilling on 11/11/2019 09:07:10 -------------------------------------------------------------------------------- Debridement Details Patient Name: Date of Service: [name], [name] 11/11/2019 8:15 A M Medical  Record Number: [id number] Patient Account Number: [id number] Date of Birth/Sex: Treating RN: 07-Feb-1941 (79 y.o. [name] Primary Care Provider: [name] Other Clinician: Referring Provider: Treating Provider/Extender: Astrid Drafts Weeks in Treatment: 16 Debridement Performed for Assessment: Wound #23 Left,Proximal,Plantar Foot Performed By: Physician Tobi Bastos, MD Debridement Type: Debridement Severity of Tissue Pre Debridement: Fat layer exposed Level of Consciousness (Pre-procedure): Awake and Alert Pre-procedure Verification/Time Out Yes - 09:00 Taken: Start Time: 09:01 Pain Control: Lidocaine 4% T opical Solution T Area Debrided (L x W): otal 3 (cm) x 2 (cm) = 6 (cm) Tissue and other material debrided: Non-Viable, Callus, Skin: Dermis , Skin: Epidermis Level: Skin/Epidermis Debridement Description: Selective/Open Wound Instrument: Blade, Curette, Forceps Specimen: Swab, Number of Specimens T aken: 1 Bleeding: Minimum Hemostasis Achieved: Pressure End Time: 09:06 Procedural Pain: 0 Post Procedural Pain: 0 Response to Treatment: Procedure was tolerated well Level of Consciousness (Post- Awake and Alert procedure): Post Debridement Measurements of Total Wound Length: (cm) 2 Width: (cm) 1 Depth: (cm) 0.4 Volume: (cm) 0.628 Character of Wound/Ulcer Post Debridement: Requires Further Debridement Severity of Tissue Post Debridement: Fat layer exposed Post Procedure Diagnosis Same as Pre-procedure Electronic Signature(s) Signed: 11/11/2019 4:57:23 PM By: Tobi Bastos MD, MBA Signed: 11/11/2019 5:42:52 PM By: Deon Pilling Entered By: Deon Pilling on 11/11/2019 09:15:37 -------------------------------------------------------------------------------- HPI Details Patient Name: Date of Service: [name], [name] 11/11/2019 8:15 A M Medical Record Number: [id number] Patient Account Number: [id number] Date of Birth/Sex:  Treating RN: [birth year] (79 y.o. [name] Primary Care Provider: [name] Other Clinician: Referring Provider: Treating Provider/Extender: Astrid Drafts Weeks in Treatment: 16 History of Present Illness HPI Description: very pleasant 79 year old gentleman has type 2 diabetes and has been having an ulcer on his foot for several years. Been seeing as this time around since the end of October. Classified as a Wagner grade 2 because his previous x-rays did not show any problems. He has an MRI pending this Friday.  discussing with him he says he has not been able to get his diabetic shoes yet. He continues to smoke his pipe and drinks moonshine and says he will not give this up. I have tried to counsel him regarding this but he firmly says that he is happy to listen to me but he is not going to give up his habits. 08/17/14 -- patient has no fresh complaints but has recently come with his MRI which was done on 08/12/2014. 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 Readmission: 07/21/2019 upon evaluation today patient presents for initial inspection here in our clinic concerning issues that he has been having with his bilateral feet. He has open wounds that been present at least since the beginning of the year although he does not know an exact time. He is previously had amputations of all toes and the distal portion of his foot. 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 With that being said he does have a history of diabetes, peripheral vascular disease, nicotine dependence, and hypertension. He has not had any recent arterial or vascular studies he was noncompressible today I think he is going to require referral for arterial studies as well. 2/11; patient was readmitted to our clinic last week. He has bilateral plantar foot wounds in  the setting of previous transmetatarsal amputations. He had his arterial studies done on 2/8; these were actually quite good. He was noncompressible on both sides however his waveforms were triphasic. The thought was noncompressible vessels consistent with medial calcification but without significant stenosis in the lower extremities. X-rays were done on the left this showed extensive chronic and postoperative changes without evidence of osteomyelitis. There was no evidence of osteomyelitis on the right foot there was mild vascular calcification MRI was suggested on the left based on clinical correlation. The patient arrives in clinic with some odor and drainage from the left foot. Right foot measures smaller. He is really not offloading these areas although he says he does not walk much 2/25; the patient arrives with some odor and drainage from the left foot again. Post debridement I cultured this area. He has the area on the midfoot roughly the third metatarsal head and the first metatarsal head on the right. He is in surgical shoes bilateral transmet 3/4; culture from last week showed abundant group B strep and a few Proteus. We will put him on cephalexin starting today. Still silver alginate to the wounds 3/11; due to issues with pharmacy he still does not have the cephalexin that I prescribed a week ago. Apparently this was called into a different pharmacy on Friday but they have not delivered it to him. We have been using silver alginate on the wounds but because of lack of moisture change to silver collagen today. We are offloading as best we can in surgical shoes 3/18; he is taking the cephalexin that I prescribed 2 weeks ago. Using silver collagen. He has surgical shoes. Setting of bilateral previous transmetatarsal amputations. Wounds are mirror-image wounds medially. 3/25; mirror-image wounds in the setting of previous transmetatarsal amputation. The wounds are mirror image medial foot  wounds. We have been using silver collagen. We are using surgical shoes offloaded with felt. He came in today asking about his "$900" custom-made shoes although after talking to them it does not appear that he ever really wore these and they are more than a year old 4/8; mirror-image  wounds on the medial part of the plantar feet in the setting of previous transmetatarsal amputations. We have been using silver collagen. I aggressively debrided these 2 weeks ago removing callus subcutaneous tissue from around the wound margins. He did indeed bring echo he has custom-made shoes with transmetatarsal inserts. He has never worn these. He claims to be not spending a lot of time on his feet 4/15; mirror-image wounds on the plantar feet in the setting of previous transmetatarsal amputation. We have been using silver alginate. Culture that I did last time grew MRSA and Proteus. I have him on doxycycline. This was a deep tissue culture. The Proteus was not actively plated against doxycycline but was otherwise pansensitive I think the doxycycline would cover this. His wounds actually looks somewhat better. Rolled senescent edges still around the wound edges 4/29; mirror-image wounds on the plantar feet in the setting of previous bilateral transmetatarsal amputations we have been using silver alginate. Last time I saw him 2 weeks ago I gave him a course of doxycycline. Things seem to have cleaned up. This is in response to a deep tissue culture that showed MRSA and Proteus 5/6; wounds on the bilateral plantar feet in the setting of previous transmetatarsal amputations. We have been using silver alginate. Arrives in the clinic today with the same raised thick callus and subcutaneous tissue. He has home health changing the dressing. He brought up the fact that he has transportation through [facility] apparently he only has "12 visits". Otherwise he has the pay $40 for a cab ride there and back from his home  which I guess is [place] [address]. 11/11/19-Patient back with left transmet plantar surface much worse with extension of devitalized tissue proximally from the original wound with dense callus rim, the right is about the same, apparently patient was wearing tennis shoes at home per home health, although patient swears that he has been using his offloading devices that he has with inserts. Electronic Signature(s) Signed: 11/11/2019 9:20:21 AM By: Tobi Bastos MD, MBA Previous Signature: 11/11/2019 8:58:51 AM Version By: Tobi Bastos MD, MBA Entered By: Tobi Bastos on 11/11/2019 09:20:21 -------------------------------------------------------------------------------- Physical Exam Details Patient Name: Date of Service: [name], [name] 11/11/2019 8:15 A M Medical Record Number: [id number] Patient Account Number: [id number] Date of Birth/Sex: Treating RN: [birth year] (79 y.o. [name] Primary Care Provider: [name] Other Clinician: Referring Provider: Treating Provider/Extender: Astrid Drafts Weeks in Treatment: 16 Constitutional alert and oriented x 3. sitting or standing blood pressure is within target range for patient.. supine blood pressure is within target range for patient.. pulse regular and within target range for patient.Marland Kitchen respirations regular, non-labored and within target range for patient.Marland Kitchen temperature within target range for patient.. . . Well- nourished and well-hydrated in no acute distress. Notes Left transmet plantar surface with thick callus rim on the original wound with extension upward of tunneling macerated tissue and this area was debrided with removal of dead skin and macerated tissue using scalpel and forceps and then a curette, the wound extends up to the distal surface of the transmet site from the plantar surface about 2.5 cm, this area also was noted to have purulent drainage when patient was first seen by  the RN, therefore this wound was cultured today Electronic Signature(s) Signed: 11/11/2019 9:21:45 AM By: Tobi Bastos MD, MBA Entered By: Tobi Bastos on 11/11/2019 09:21:45 -------------------------------------------------------------------------------- Physician Orders Details Patient Name: Date of Service: Casey O [name], [name]  11/11/2019 8:15 A M Medical Record Number: [id number] Patient Account Number: [id number] Date of Birth/Sex: Treating RN: 01/02/1941 (79 y.o. [name] Primary Care Provider: [name] Other Clinician: Referring Provider: Treating Provider/Extender: Astrid Drafts Weeks in Treatment: 8 Verbal / Phone Orders: No Diagnosis Coding ICD-10 Coding Code Description E11.621 Type 2 diabetes mellitus with foot ulcer L97.522 Non-pressure chronic ulcer of other part of left foot with fat layer exposed L97.512 Non-pressure chronic ulcer of other part of right foot with fat layer exposed I73.89 Other specified peripheral vascular diseases F17.218 Nicotine dependence, cigarettes, with other nicotine-induced disorders I10 Essential (primary) hypertension L84 Corns and callosities Follow-up Appointments ppointment in 1 week. - Thursday Return A Dressing Change Frequency Change dressing three times week. Wound Cleansing Clean wound with Wound Cleanser May shower with protection. Primary Wound Dressing Wound #21 Left,Plantar Foot lginate with Silver - ensure to pack into wound bed and any undermining. Calcium A Wound #22 Right Metatarsal head first lginate with Silver - ensure to pack into wound bed and any undermining. Calcium A Wound #23 Left,Proximal,Plantar Foot lginate with Silver - ensure to pack into wound bed and any undermining. Calcium A Secondary Dressing Wound #21 Left,Plantar Foot Foam - foam donut to offload Kerlix/Rolled Gauze Dry Gauze Wound #22 Right Metatarsal head first Foam - foam donut to  offload Kerlix/Rolled Gauze Dry Gauze Edema Control Avoid standing for long periods of time Elevate legs to the level of the heart or above for 30 minutes daily and/or when sitting, a frequency of: - throughout the day. Off-Loading Other: - felt patient's personal shoes. patient to wear diabetic shoes. patient to limit about of time spent walking and standing. [facility] for wound care. - [facility] naerobe culture (MICRO) - left foot proximal plantar foot wound. - (ICD10 E11.621 - Type 2 Bacteria identified in Unspecified specimen by A diabetes mellitus with foot ulcer) LOINC Code: [id number] Convenience Name: Anerobic culture Radiology X-ray, foot left - x-ray of left diabetic foot ulcers looking for infection. CPT code [phone] - (ICD10 E11.621 - Type 2 diabetes mellitus with foot ulcer) Electronic Signature(s) Signed: 11/11/2019 4:57:23 PM By: Tobi Bastos MD, MBA Signed: 11/11/2019 5:42:52 PM By: Deon Pilling Entered By: Deon Pilling on 11/11/2019 09:14:22 Prescription 11/11/2019 -------------------------------------------------------------------------------- Leonides Grills MD Patient Name: Provider: 1941/02/28 [id number] Date of Birth: NPI#: [name] [id number] Sex: DEA #: [id number] Phone #: License #: [place] Patient Address: [address] [place], [place] [address] [place], [place] [address] [phone] Allergies Name Reaction Severity No Known Drug Allergies Provider's Orders X-ray, foot left - ICD10: E11.621 - x-ray of left diabetic foot ulcers looking for infection. CPT code [phone] Hand Signature: Date(s): Electronic Signature(s) Signed: 11/11/2019 4:57:23 PM By: Tobi Bastos MD, MBA Signed: 11/11/2019 5:42:52 PM By: Deon Pilling Entered By: Deon Pilling on 11/11/2019  09:14:24 -------------------------------------------------------------------------------- Problem List Details Patient Name: Date of Service: [name], [name] 11/11/2019 8:15 A M Medical Record Number: [id number] Patient Account Number: [id number] Date of Birth/Sex: Treating RN: Nov 17, 1940 (79 y.o. [name] Primary Care Provider: [name] Other Clinician: Referring Provider: Treating Provider/Extender: Astrid Drafts Weeks in Treatment: 16 Active Problems ICD-10 Encounter Code Description Active Date MDM Diagnosis E11.621 Type 2 diabetes mellitus with foot ulcer 07/21/2019 No Yes L97.522 Non-pressure chronic ulcer of other part of left foot with fat layer exposed 07/21/2019 No Yes L97.512 Non-pressure  chronic ulcer of other part of right foot with fat layer exposed 07/21/2019 No Yes I73.89 Other specified peripheral vascular diseases 07/21/2019 No Yes F17.218 Nicotine dependence, cigarettes, with other nicotine-induced disorders 07/21/2019 No Yes I10 Essential (primary) hypertension 07/21/2019 No Yes L84 Corns and callosities 07/21/2019 No Yes Inactive Problems Resolved Problems Electronic Signature(s) Signed: 11/11/2019 4:57:23 PM By: Tobi Bastos MD, MBA Signed: 11/11/2019 5:42:52 PM By: Deon Pilling Entered By: Deon Pilling on 11/11/2019 09:05:48 -------------------------------------------------------------------------------- Progress Note Details Patient Name: Date of Service: Casey O [name], [name] 11/11/2019 8:15 A M Medical Record Number: [id number] Patient Account Number: [id number] Date of Birth/Sex: Treating RN: [birth year] (79 y.o. [name] Primary Care Provider: [name] Other Clinician: Referring Provider: Treating Provider/Extender: Astrid Drafts Weeks in Treatment: 16 Subjective History of Present Illness (HPI) very pleasant 79 year old gentleman has type 2 diabetes and has been  having an ulcer on his foot for several years. Been seeing as this time around since the end of October. Classified as a Wagner grade 2 because his previous x-rays did not show any problems. He has an MRI pending this Friday. discussing with him he says he has not been able to get his diabetic shoes yet. He continues to smoke his pipe and drinks moonshine and says he will not give this up. I have tried to counsel him regarding this but he firmly says that he is happy to listen to me but he is not going to give up his habits. 08/17/14 -- patient has no fresh complaints but has recently come with his MRI which was done on 08/12/2014. 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 Readmission: 07/21/2019 upon evaluation today patient presents for initial inspection here in our clinic concerning issues that he has been having with his bilateral feet. He has open wounds that been present at least since the beginning of the year although he does not know an exact time. He is previously had amputations of all toes and the distal portion of his foot. 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 With that being said he does have a history of diabetes, peripheral vascular disease, nicotine dependence, and hypertension. He has not had any recent arterial or vascular studies he was noncompressible today I think he is going to require referral for arterial studies as well. 2/11; patient was readmitted to our clinic last week. He has bilateral plantar foot wounds in the setting of previous transmetatarsal amputations. He had his arterial studies done on 2/8; these were actually quite good. He was noncompressible on both sides however his waveforms were triphasic. The thought was noncompressible vessels consistent with medial calcification but without significant stenosis in the lower  extremities. X-rays were done on the left this showed extensive chronic and postoperative changes without evidence of osteomyelitis. There was no evidence of osteomyelitis on the right foot there was mild vascular calcification MRI was suggested on the left based on clinical correlation. The patient arrives in clinic with some odor and drainage from the left foot. Right foot measures smaller. He is really not offloading these areas although he says he does not walk much 2/25; the patient arrives with some odor and drainage from the left foot again. Post debridement I cultured this area. He has the area on the midfoot roughly the third metatarsal head and the first metatarsal head on the right. He is in surgical shoes  bilateral transmet 3/4; culture from last week showed abundant group B strep and a few Proteus. We will put him on cephalexin starting today. Still silver alginate to the wounds 3/11; due to issues with pharmacy he still does not have the cephalexin that I prescribed a week ago. Apparently this was called into a different pharmacy on Friday but they have not delivered it to him. We have been using silver alginate on the wounds but because of lack of moisture change to silver collagen today. We are offloading as best we can in surgical shoes 3/18; he is taking the cephalexin that I prescribed 2 weeks ago. Using silver collagen. He has surgical shoes. Setting of bilateral previous transmetatarsal amputations. Wounds are mirror-image wounds medially. 3/25; mirror-image wounds in the setting of previous transmetatarsal amputation. The wounds are mirror image medial foot wounds. We have been using silver collagen. We are using surgical shoes offloaded with felt. He came in today asking about his "$900" custom-made shoes although after talking to them it does not appear that he ever really wore these and they are more than a year old 4/8; mirror-image wounds on the medial part of the plantar  feet in the setting of previous transmetatarsal amputations. We have been using silver collagen. I aggressively debrided these 2 weeks ago removing callus subcutaneous tissue from around the wound margins. He did indeed bring echo he has custom-made shoes with transmetatarsal inserts. He has never worn these. He claims to be not spending a lot of time on his feet 4/15; mirror-image wounds on the plantar feet in the setting of previous transmetatarsal amputation. We have been using silver alginate. Culture that I did last time grew MRSA and Proteus. I have him on doxycycline. This was a deep tissue culture. The Proteus was not actively plated against doxycycline but was otherwise pansensitive I think the doxycycline would cover this. His wounds actually looks somewhat better. Rolled senescent edges still around the wound edges 4/29; mirror-image wounds on the plantar feet in the setting of previous bilateral transmetatarsal amputations we have been using silver alginate. Last time I saw him 2 weeks ago I gave him a course of doxycycline. Things seem to have cleaned up. This is in response to a deep tissue culture that showed MRSA and Proteus 5/6; wounds on the bilateral plantar feet in the setting of previous transmetatarsal amputations. We have been using silver alginate. Arrives in the clinic today with the same raised thick callus and subcutaneous tissue. He has home health changing the dressing. He brought up the fact that he has transportation through [facility] apparently he only has "12 visits". Otherwise he has the pay $40 for a cab ride there and back from his home which I guess is [place] [address]. 11/11/19-Patient back with left transmet plantar surface much worse with extension of devitalized tissue proximally from the original wound with dense callus rim, the right is about the same, apparently patient was wearing tennis shoes at home per home health, although patient swears that he  has been using his offloading devices that he has with inserts. Objective Constitutional alert and oriented x 3. sitting or standing blood pressure is within target range for patient.. supine blood pressure is within target range for patient.. pulse regular and within target range for patient.Marland Kitchen respirations regular, non-labored and within target range for patient.Marland Kitchen temperature within target range for patient.. Well- nourished and well-hydrated in no acute distress. Vitals Time Taken: 8:13 AM, Height: 73 in, Weight:  180 lbs, BMI: 23.7, Temperature: 98.7 F, Pulse: 62 bpm, Respiratory Rate: 19 breaths/min, Blood Pressure: 118/51 mmHg. General Notes: Left transmet plantar surface with thick callus rim on the original wound with extension upward of tunneling macerated tissue and this area was debrided with removal of dead skin and macerated tissue using scalpel and forceps and then a curette, the wound extends up to the distal surface of the transmet site from the plantar surface about 2.5 cm, this area also was noted to have purulent drainage when patient was first seen by the RN, therefore this wound was cultured today Integumentary (Hair, Skin) Wound #21 status is Open. Original cause of wound was Gradually Appeared. The wound is located on the [place]. The wound measures 0.3cm length x 0.3cm width x 0.4cm depth; 0.071cm^2 area and 0.028cm^3 volume. There is Fat Layer (Subcutaneous Tissue) Exposed exposed. There is no tunneling noted, however, there is undermining starting at 3:00 and ending at 9:00 with a maximum distance of 0.4cm. There is a medium amount of serosanguineous drainage noted. The wound margin is thickened. There is large (67-100%) red, pink granulation within the wound bed. There is no necrotic tissue within the wound bed. Wound #22 status is Open. Original cause of wound was Gradually Appeared. The wound is located on the Right Metatarsal head first. The wound  measures 0.9cm length x 1cm width x 0.5cm depth; 0.707cm^2 area and 0.353cm^3 volume. There is Fat Layer (Subcutaneous Tissue) Exposed exposed. There is no tunneling noted, however, there is undermining starting at 4:00 and ending at 9:00 with a maximum distance of 0.3cm. There is a medium amount of serosanguineous drainage noted. The wound margin is thickened. There is large (67-100%) red, pink granulation within the wound bed. There is no necrotic tissue within the wound bed. Wound #23 status is Open. Original cause of wound was Gradually Appeared. The wound is located on the Left,Proximal,Plantar Foot. The wound measures 0.5cm length x 0.4cm width x 0.9cm depth; 0.157cm^2 area and 0.141cm^3 volume. There is Fat Layer (Subcutaneous Tissue) Exposed exposed. There is no tunneling noted, however, there is undermining starting at 12:00 and ending at 12:00 with a maximum distance of 2.2cm. There is a small amount of purulent drainage noted. Foul odor after cleansing was noted. The wound margin is well defined and not attached to the wound base. There is large (67-100%) pink, pale granulation within the wound bed. There is no necrotic tissue within the wound bed. 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 There was a Selective/Open Wound Non-Viable Tissue Debridement with a total area of 0.25 sq cm performed by Tobi Bastos, MD. With the following  instrument(s): Blade, and Forceps to remove Non-Viable tissue/material. Material removed includes Callus after achieving pain control using Lidocaine 4% T opical Solution. A time out was conducted at 09:00, prior to the start of the procedure. A Minimum amount of bleeding was controlled with Pressure. The procedure was tolerated well with a pain level of 0 throughout and a pain level of 0 following the procedure. Post Debridement  Measurements: 0.3cm length x 0.3cm width x 0.4cm depth; 0.028cm^3 volume. Character of Wound/Ulcer Post Debridement requires further debridement. Severity of Tissue Post Debridement is: Fat layer exposed. Post procedure Diagnosis Wound #21: Same as Pre-Procedure Wound #23 Pre-procedure diagnosis of Wound #23 is a Diabetic Wound/Ulcer of the Lower Extremity located on the Left,Proximal,Plantar Foot .Severity of Tissue Pre Debridement is: Fat layer exposed. There was a Selective/Open Wound Skin/Epidermis Debridement with a total area of 6 sq cm performed by Tobi Bastos, MD. With the following instrument(s): Blade, Curette, and Forceps to remove Non-Viable tissue/material. Material removed includes Callus, Skin: Dermis, and Skin: Epidermis after achieving pain control using Lidocaine 4% T opical Solution. 1 specimen was taken by a Swab and sent to the lab per facility protocol. A time out was conducted at 09:00, prior to the start of the procedure. A Minimum amount of bleeding was controlled with Pressure. The procedure was tolerated well with a pain level of 0 throughout and a pain level of 0 following the procedure. Post Debridement Measurements: 2cm length x 1cm width x 0.4cm depth; 0.628cm^3 volume. Character of Wound/Ulcer Post Debridement requires further debridement. Severity of Tissue Post Debridement is: Fat layer exposed. Post procedure Diagnosis Wound #23: Same as Pre-Procedure Plan Follow-up Appointments: Return Appointment in 1 week. - Thursday Dressing  Change Frequency: Change dressing three times week. Wound Cleansing: Clean wound with Wound Cleanser May shower with protection. Primary Wound Dressing: Wound #21 Left,Plantar Foot: Calcium Alginate with Silver - ensure to pack into wound bed and any undermining. Wound #22 Right Metatarsal head first: Calcium Alginate with Silver - ensure to pack into wound bed and any undermining. Wound #23 Left,Proximal,Plantar Foot: Calcium Alginate with Silver - ensure to pack into wound bed and any undermining. Secondary Dressing: Wound #21 Left,Plantar Foot: Foam - foam donut to offload Kerlix/Rolled Gauze Dry Gauze Wound #22 Right Metatarsal head first: Foam - foam donut to offload Kerlix/Rolled Gauze Dry Gauze Edema Control: Avoid standing for long periods of time Elevate legs to the level of the heart or above for 30 minutes daily and/or when sitting, a frequency of: - throughout the day. Off-Loading: Other: - felt patient's personal shoes. patient to wear diabetic shoes. patient to limit about of time spent walking and standing. Home Health: [facility] for [facility]. - [facility] Laboratory ordered were: Anerobic culture - left foot proximal plantar foot wound. Radiology ordered were: X-ray, foot left - x-ray of left diabetic foot ulcers looking for infection. CPT code [phone] -Cultured the left transmet new wound which is distal to the original wound, debrided the surface and removed as much dead skin and devitalized tissue as possible revealing a shallow wound -The plantar wound itself has very thick rim of callus -This whole forefoot area is looking unstable, x-ray will be obtained today of the forefoot, cultures will be followed -Switch to silver alginate dressing and packing -Emphasized role of offloading to this patient repeatedly -Antibiotics can be started once culture results are available and x-ray is also reviewed. Electronic Signature(s) Signed:  11/11/2019 9:23:32 AM By: Tobi Bastos MD, MBA Entered By: Tobi Bastos on 11/11/2019 09:23:32 -------------------------------------------------------------------------------- SuperBill Details Patient Name: Date of Service: [name], [name] 11/11/2019 Medical Record Number: [id number] Patient Account Number: [id number] Date of Birth/Sex: Treating RN: 01/10/1941 (79 y.o. [name] Primary Care Provider: [name] Other Clinician: Referring Provider: Treating Provider/Extender: Astrid Drafts Weeks in Treatment: 16 Diagnosis Coding ICD-10 Codes Code Description [phone]  Type 2 diabetes mellitus with foot ulcer L97.522 Non-pressure chronic ulcer of other part of left foot with fat layer exposed L97.512 Non-pressure chronic ulcer of other part of right foot with fat layer exposed I73.89 Other specified peripheral vascular diseases F17.218 Nicotine dependence, cigarettes, with other nicotine-induced disorders I10 Essential (primary) hypertension L84 Corns and callosities Facility Procedures The patient participates with Medicare or their insurance follows the Medicare Facility Guidelines: CPT4 Code Description Modifier Quantity [id number] - DEBRIDE WOUND 1ST 20 SQ CM OR < 1 ICD-10 Diagnosis Description L97.522 Non-pressure chronic ulcer  of other part of left foot with fat layer exposed E11.621 Type 2 diabetes mellitus with foot ulcer Physician Procedures : CPT4 Code Description Modifier [id number] - WC PHYS DEBR WO ANESTH 20 SQ CM ICD-10 Diagnosis Description L97.522 Non-pressure chronic ulcer of other part of left foot with fat layer exposed E11.621 Type 2 diabetes mellitus with foot ulcer Quantity: 1 Electronic Signature(s) Signed: 11/11/2019 9:23:43 AM By: Tobi Bastos MD, MBA Entered By: Tobi Bastos on 11/11/2019 09:23:43

## 2019-11-12 NOTE — Progress Notes (Signed)
[name], [name] ([id number]) Visit Report for 11/11/2019 Arrival Information Details Patient Name: Date of Service: [name], [place] [name] 11/11/2019 8:15 A M Medical Record Number: [id number] Patient Account Number: [id number] Date of Birth/Sex: Treating RN: Aug 29, 1940 (79 y.o. [name] Primary Care [name]: [name] Other Clinician: Referring Jaythan Hinely: Treating Violet Seabury/Extender: Astrid Drafts Weeks in Treatment: 16 Visit Information History Since Last Visit Added or deleted any medications: No Patient Arrived: Cane Any new allergies or adverse reactions: No Arrival Time: 08:09 Had a fall or experienced change in No Accompanied By: self activities of daily living that may affect Transfer Assistance: None risk of falls: Patient Identification Verified: Yes Signs or symptoms of abuse/neglect since last visito No Secondary Verification Process Completed: Yes Hospitalized since last visit: No Patient Requires Transmission-Based Precautions: No Implantable device outside of the clinic excluding No Patient Has Alerts: Yes cellular tissue based products placed in the center Patient Alerts: R ABI non compressible since last visit: L ABI non compressible Has Dressing in Place as Prescribed: Yes Pain Present Now: No Electronic Signature(s) Signed: 11/12/2019 4:59:31 PM By: Kela Millin Entered By: Kela Millin on 11/11/2019 08:09:38 -------------------------------------------------------------------------------- Encounter Discharge Information Details Patient Name: Date of Service: [name] 11/11/2019 8:15 A M Medical Record Number: [id number] Patient Account Number: [id number] Date of Birth/Sex: Treating RN: 03/09/1941 (79 y.o. [name] Primary Care Noelia Lenart: [name] Other Clinician: Referring Aland Chestnutt: Treating Hanley Woerner/Extender: Astrid Drafts Weeks in Treatment:  5 Encounter Discharge Information Items Post Procedure Vitals Discharge Condition: Stable Temperature (F): 98.7 Ambulatory Status: Cane Pulse (bpm): 62 Discharge Destination: Home Respiratory Rate (breaths/min): 18 Transportation: Private Auto Blood Pressure (mmHg): 118/51 Accompanied By: self Schedule Follow-up Appointment: Yes Clinical Summary of Care: Patient Declined Electronic Signature(s) Signed: 11/11/2019 5:31:16 PM By: Baruch Gouty RN, BSN Entered By: Baruch Gouty on 11/11/2019 09:41:38 -------------------------------------------------------------------------------- Lower Extremity Assessment Details Patient Name: Date of Service: [name] 11/11/2019 8:15 A M Medical Record Number: [id number] Patient Account Number: [id number] Date of Birth/Sex: Treating RN: Casey 17, 1942 (79 y.o. [name] Primary Care Cait Locust: [name] Other Clinician: Referring Virl Coble: Treating Justina Bertini/Extender: Astrid Drafts Weeks in Treatment: 16 Edema Assessment Assessed: [Left: No] [Right: No] Edema: [Left: Yes] [Right: Yes] Calf Left: Right: Point of Measurement: 32 cm From Medial Instep 31 cm 32 cm Ankle Left: Right: Point of Measurement: 10 cm From Medial Instep 23.5 cm 26.5 cm Vascular Assessment Pulses: Dorsalis Pedis Palpable: [Left:Yes] [Right:Yes] Electronic Signature(s) Signed: 11/12/2019 4:59:31 PM By: Kela Millin Entered By: Kela Millin on 11/11/2019 08:15:06 -------------------------------------------------------------------------------- Pain Assessment Details Patient Name: Date of Service: [name], [name] [name] 11/11/2019 8:15 A M Medical Record Number: [id number] Patient Account Number: [id number] Date of Birth/Sex: Treating RN: March 25, 1941 (79 y.o. [name] Primary Care Jemma Rasp: [name] Other Clinician: Referring Ruthie Berch: Treating Emmalia Heyboer/Extender: Astrid Drafts Weeks in Treatment: 16 Active Problems Location of Pain Severity and Description of Pain Patient Has Paino No Site Locations Pain Management and Medication Current Pain Management: Electronic Signature(s) Signed: 11/12/2019 4:59:31 PM By: Kela Millin Entered By: Kela Millin on 11/11/2019 08:13:40 -------------------------------------------------------------------------------- Wound Assessment Details Patient Name: Date of Service: [name] 11/11/2019 8:15 A M Medical Record Number: [id number] Patient Account Number: [id number] Date of Birth/Sex: Treating RN: [birth year] (79 y.o. [name] Primary Care Maleak Brazzel: [name] Other Clinician: Referring Laurin Paulo: Treating Hayato Guaman/Extender: Astrid Drafts Weeks in Treatment:  16 Wound Status Wound Number: 21 Primary Diabetic Wound/Ulcer of the Lower Extremity Etiology: Wound Location: Left, Plantar Foot Wound Open Wounding Event: Gradually Appeared Status: Date Acquired: 06/17/2018 Comorbid Cataracts, Glaucoma, Hypertension, Peripheral Arterial Disease, Weeks Of Treatment: 16 History: Type II Diabetes, Osteomyelitis, Neuropathy Clustered Wound: No Photos Photo Uploaded By: [name] on 11/12/2019 08:53:01 Wound Measurements Length: (cm) 0.3 Width: (cm) 0.3 Depth: (cm) 0.4 Area: (cm) 0.071 Volume: (cm) 0.028 Wound Description Classification: Grade 2 Wound Margin: Thickened Exudate Amount: Medium Exudate Type: Serosanguineous Exudate Color: red, brown Foul Odor After Cleansing: Slough/Fibrino % Reduction in Area: 96.9% % Reduction in Volume: 97.6% Epithelialization: None Tunneling: No Undermining: Yes Starting Position (o'clock): 3 Ending Position (o'clock): 9 Maximum Distance: (cm) 0.4 No No Wound Bed Granulation Amount: Large (67-100%) Exposed Structure Granulation Quality: Red, Pink Fascia Exposed:  No Necrotic Amount: None Present (0%) Fat Layer (Subcutaneous Tissue) Exposed: Yes Tendon Exposed: No Muscle Exposed: No Joint Exposed: No Bone Exposed: No Treatment Notes Wound #21 (Left, Plantar Foot) 2. Periwound Care Moisturizing lotion 3. Primary Dressing Applied Calcium Alginate Ag 4. Secondary Dressing Dry Gauze Roll Gauze Foam 7. Footwear/Offloading device applied Diabetic shoe Electronic Signature(s) Signed: 11/12/2019 4:59:31 PM By: Kela Millin Entered By: Kela Millin on 11/11/2019 08:25:52 -------------------------------------------------------------------------------- Wound Assessment Details Patient Name: Date of Service: [name] 11/11/2019 8:15 A M Medical Record Number: [id number] Patient Account Number: [id number] Date of Birth/Sex: Treating RN: 03-Jan-1941 (79 y.o. [name] Primary Care Trenton Passow: [name] Other Clinician: Referring Naba Sneed: Treating Telitha Plath/Extender: Astrid Drafts Weeks in Treatment: 16 Wound Status Wound Number: 22 Primary Diabetic Wound/Ulcer of the Lower Extremity Etiology: Wound Location: Right Metatarsal head first Wound Open Wounding Event: Gradually Appeared Status: Date Acquired: 06/17/2018 Comorbid Cataracts, Glaucoma, Hypertension, Peripheral Arterial Disease, Weeks Of Treatment: 16 History: Type II Diabetes, Osteomyelitis, Neuropathy Clustered Wound: No Photos Photo Uploaded By: [name] on 11/12/2019 08:50:34 Wound Measurements Length: (cm) 0.9 Width: (cm) 1 Depth: (cm) 0.5 Area: (cm) 0.707 Volume: (cm) 0.353 % Reduction in Area: -12.6% % Reduction in Volume: -87.8% Epithelialization: None Tunneling: No Undermining: Yes Starting Position (o'clock): 4 Ending Position (o'clock): 9 Maximum Distance: (cm) 0.3 Wound Description Classification: Grade 2 Wound Margin: Thickened Exudate Amount: Medium Exudate Type:  Serosanguineous Exudate Color: red, brown Foul Odor After Cleansing: No Slough/Fibrino No Wound Bed Granulation Amount: Large (67-100%) Exposed Structure Granulation Quality: Red, Pink Fascia Exposed: No Necrotic Amount: None Present (0%) Fat Layer (Subcutaneous Tissue) Exposed: Yes Tendon Exposed: No Muscle Exposed: No Joint Exposed: No Bone Exposed: No Treatment Notes Wound #22 (Right Metatarsal head first) 2. Periwound Care Moisturizing lotion 3. Primary Dressing Applied Calcium Alginate Ag 4. Secondary Dressing Dry Gauze Roll Gauze Foam 7. Footwear/Offloading device applied Diabetic shoe Electronic Signature(s) Signed: 11/12/2019 4:59:31 PM By: Kela Millin Entered By: Kela Millin on 11/11/2019 08:25:07 -------------------------------------------------------------------------------- Wound Assessment Details Patient Name: Date of Service: [name] 11/11/2019 8:15 A M Medical Record Number: [id number] Patient Account Number: [id number] Date of Birth/Sex: Treating RN: [birth year] (79 y.o. [name] Primary Care Braelynn Lupton: [name] Other Clinician: Referring Jennafer Gladue: Treating Hancel Ion/Extender: Astrid Drafts Weeks in Treatment: 16 Wound Status Wound Number: 23 Primary Diabetic Wound/Ulcer of the Lower Extremity Etiology: Wound Location: Left, Plantar Foot Wound Open Wounding Event: Gradually Appeared Status: Date Acquired: 11/11/2019 Comorbid Cataracts, Glaucoma, Hypertension, Peripheral Arterial Disease, Weeks Of Treatment: 0 History: Type II Diabetes, Osteomyelitis, Neuropathy Clustered Wound: No Photos Photo Uploaded By: Ronnald Ramp,  Dedrick on 11/12/2019 08:50:35 Wound Measurements Length: (cm) 0.5 Width: (cm) 0.4 Depth: (cm) 0.9 Area: (cm) 0.157 Volume: (cm) 0.141 % Reduction in Area: % Reduction in Volume: Epithelialization: None Tunneling: No Undermining: Yes Starting Position  (o'clock): 12 Ending Position (o'clock): 12 Maximum Distance: (cm) 2.2 Wound Description Classification: Grade 2 Wound Margin: Well defined, not attached Exudate Amount: Small Exudate Type: Purulent Exudate Color: yellow, brown, green Foul Odor After Cleansing: Yes Due to Product Use: No Slough/Fibrino No Wound Bed Granulation Amount: Large (67-100%) Exposed Structure Granulation Quality: Pink, Pale Fascia Exposed: No Necrotic Amount: None Present (0%) Fat Layer (Subcutaneous Tissue) Exposed: Yes Tendon Exposed: No Muscle Exposed: No Joint Exposed: No Bone Exposed: No Electronic Signature(s) Signed: 11/12/2019 4:59:31 PM By: Kela Millin Entered By: Kela Millin on 11/11/2019 08:24:16 -------------------------------------------------------------------------------- Vitals Details Patient Name: Date of Service: [name] 11/11/2019 8:15 A M Medical Record Number: [id number] Patient Account Number: [id number] Date of Birth/Sex: Treating RN: [birth year] (79 y.o. [name] Primary Care Lynnsey Barbara: [name] Other Clinician: Referring Krystyne Tewksbury: Treating Oleg Oleson/Extender: Astrid Drafts Weeks in Treatment: 16 Vital Signs Time Taken: 08:13 Temperature (F): 98.7 Height (in): 73 Pulse (bpm): 62 Weight (lbs): 180 Respiratory Rate (breaths/min): 19 Body Mass Index (BMI): 23.7 Blood Pressure (mmHg): 118/51 Reference Range: 80 - 120 mg / dl Electronic Signature(s) Signed: 11/12/2019 4:59:31 PM By: Kela Millin Entered By: Kela Millin on 11/11/2019 08:13:34

## 2019-11-15 LAB — AEROBIC CULTURE W GRAM STAIN (SUPERFICIAL SPECIMEN): Gram Stain: NONE SEEN

## 2019-11-18 ENCOUNTER — Encounter (HOSPITAL_BASED_OUTPATIENT_CLINIC_OR_DEPARTMENT_OTHER): Payer: Medicare Other | Admitting: Internal Medicine

## 2019-11-23 ENCOUNTER — Encounter (HOSPITAL_BASED_OUTPATIENT_CLINIC_OR_DEPARTMENT_OTHER): Payer: Medicare Other | Attending: Internal Medicine | Admitting: Internal Medicine

## 2019-11-23 DIAGNOSIS — E1140 Type 2 diabetes mellitus with diabetic neuropathy, unspecified: Secondary | ICD-10-CM | POA: Diagnosis not present

## 2019-11-23 DIAGNOSIS — L97512 Non-pressure chronic ulcer of other part of right foot with fat layer exposed: Secondary | ICD-10-CM | POA: Diagnosis not present

## 2019-11-23 DIAGNOSIS — E1139 Type 2 diabetes mellitus with other diabetic ophthalmic complication: Secondary | ICD-10-CM | POA: Insufficient documentation

## 2019-11-23 DIAGNOSIS — I10 Essential (primary) hypertension: Secondary | ICD-10-CM | POA: Diagnosis not present

## 2019-11-23 DIAGNOSIS — F17218 Nicotine dependence, cigarettes, with other nicotine-induced disorders: Secondary | ICD-10-CM | POA: Diagnosis not present

## 2019-11-23 DIAGNOSIS — L97522 Non-pressure chronic ulcer of other part of left foot with fat layer exposed: Secondary | ICD-10-CM | POA: Diagnosis not present

## 2019-11-23 DIAGNOSIS — L84 Corns and callosities: Secondary | ICD-10-CM | POA: Insufficient documentation

## 2019-11-23 DIAGNOSIS — E1151 Type 2 diabetes mellitus with diabetic peripheral angiopathy without gangrene: Secondary | ICD-10-CM | POA: Insufficient documentation

## 2019-11-23 DIAGNOSIS — H42 Glaucoma in diseases classified elsewhere: Secondary | ICD-10-CM | POA: Insufficient documentation

## 2019-11-23 DIAGNOSIS — E11621 Type 2 diabetes mellitus with foot ulcer: Secondary | ICD-10-CM | POA: Insufficient documentation

## 2019-11-24 NOTE — Progress Notes (Signed)
KLYE, BESECKER ([id number]) Visit Report for 11/23/2019 Debridement Details Patient Name: Date of Service: [place], [place] 11/23/2019 8:00 A M Medical Record Number: [id number] Patient Account Number: [id number] Date of Birth/Sex: Treating RN: 09-17-1940 (78 y.o. [name]) [name] Primary Care Provider: [name] Other Clinician: Referring Provider: Treating Provider/Extender: Marlow Baars Weeks in Treatment: 17 Debridement Performed for Assessment: Wound #21 [place] Performed By: Physician Ricard Dillon., MD Debridement Type: Debridement Severity of Tissue Pre Debridement: Fat layer exposed Level of Consciousness (Pre-procedure): Awake and Alert Pre-procedure Verification/Time Out Yes - 08:58 Taken: Start Time: 08:58 Pain Control: Lidocaine 5% topical ointment T Area Debrided (L x W): otal 0.8 (cm) x 1 (cm) = 0.8 (cm) Tissue and other material debrided: Viable, Non-Viable, Callus, Subcutaneous, Skin: Dermis , Skin: Epidermis Level: Skin/Subcutaneous Tissue Debridement Description: Excisional Instrument: Blade, Curette, Forceps Bleeding: Moderate Hemostasis Achieved: Silver Nitrate End Time: 09:02 Procedural Pain: 0 Post Procedural Pain: 0 Response to Treatment: Procedure was tolerated well Level of Consciousness (Post- Awake and Alert procedure): Post Debridement Measurements of Total Wound Length: (cm) 0.8 Width: (cm) 1 Depth: (cm) 0.7 Volume: (cm) 0.44 Character of Wound/Ulcer Post Debridement: Improved Severity of Tissue Post Debridement: Fat layer exposed Post Procedure Diagnosis Same as Pre-procedure Electronic Signature(s) Signed: 11/23/2019 4:34:33 PM By: [name] RN Signed: 11/24/2019 4:17:30 PM By: Linton Ham MD Entered By: Linton Ham on 11/23/2019 09:07:17 -------------------------------------------------------------------------------- Debridement Details Patient Name: Date of Service: [place], [place] 11/23/2019 8:00 A M Medical Record Number: [id number] Patient Account Number: [id number] Date of Birth/Sex: Treating RN: [birth year] (78 y.o. [name]) [name] Primary Care Provider: [name] Other Clinician: Referring Provider: Treating Provider/Extender: Marlow Baars Weeks in Treatment: 17 Debridement Performed for Assessment: Wound #22 Right Metatarsal head first Performed By: Physician Ricard Dillon., MD Debridement Type: Debridement Severity of Tissue Pre Debridement: Fat layer exposed Level of Consciousness (Pre-procedure): Awake and Alert Pre-procedure Verification/Time Out Yes - 08:58 Taken: Start Time: 08:58 Pain Control: Lidocaine 5% topical ointment T Area Debrided (L x W): otal 1 (cm) x 1.4 (cm) = 1.4 (cm) Tissue and other material debrided: Viable, Non-Viable, Callus, Subcutaneous, Skin: Dermis , Skin: Epidermis Level: Skin/Subcutaneous Tissue Debridement Description: Excisional Instrument: Blade, Curette, Forceps Bleeding: Moderate Hemostasis Achieved: Silver Nitrate End Time: 09:02 Procedural Pain: 0 Post Procedural Pain: 0 Response to Treatment: Procedure was tolerated well Level of Consciousness (Post- Awake and Alert procedure): Post Debridement Measurements of Total Wound Length: (cm) 1 Width: (cm) 1.4 Depth: (cm) 0.7 Volume: (cm) 0.77 Character of Wound/Ulcer Post Debridement: Improved Severity of Tissue Post Debridement: Fat layer exposed Post Procedure Diagnosis Same as Pre-procedure Electronic Signature(s) Signed: 11/23/2019 4:34:33 PM By: [name] RN Signed: 11/24/2019 4:17:30 PM By: Linton Ham MD Entered By: Linton Ham on 11/23/2019 09:07:24 -------------------------------------------------------------------------------- Debridement Details Patient Name: Date of Service: [place], [name] 11/23/2019 8:00 A M Medical Record Number: [id number] Patient Account Number: [id number] Date of  Birth/Sex: Treating RN: July 14, 1940 (78 y.o. [name] Primary Care Provider: [name] Other Clinician: Referring Provider: Treating Provider/Extender: Marlow Baars Weeks in Treatment: 17 Debridement Performed for Assessment: Wound #23 Left,Proximal,Plantar Foot Performed By: Physician Ricard Dillon., MD Debridement Type: Debridement Severity of Tissue Pre Debridement: Fat layer exposed Level of Consciousness (Pre-procedure): Awake and Alert Pre-procedure Verification/Time Out Yes - 08:58 Taken: Start Time: 08:58 Pain Control: Lidocaine 5% topical ointment T Area Debrided (L x W): otal 0.7 (cm)  x 0.4 (cm) = 0.28 (cm) Tissue and other material debrided: Viable, Non-Viable, Callus, Subcutaneous, Skin: Dermis , Skin: Epidermis Level: Skin/Subcutaneous Tissue Debridement Description: Excisional Instrument: Blade, Curette, Forceps Bleeding: Moderate Hemostasis Achieved: Silver Nitrate End Time: 09:02 Procedural Pain: 0 Post Procedural Pain: 0 Response to Treatment: Procedure was tolerated well Level of Consciousness (Post- Awake and Alert procedure): Post Debridement Measurements of Total Wound Length: (cm) 0.7 Width: (cm) 0.4 Depth: (cm) 0.6 Volume: (cm) 0.132 Character of Wound/Ulcer Post Debridement: Improved Severity of Tissue Post Debridement: Fat layer exposed Post Procedure Diagnosis Same as Pre-procedure Electronic Signature(s) Signed: 11/23/2019 4:34:33 PM By: [name] RN Signed: 11/24/2019 4:17:30 PM By: Linton Ham MD Entered By: Linton Ham on 11/23/2019 09:07:31 -------------------------------------------------------------------------------- HPI Details Patient Name: Date of Service: [place], [name] 11/23/2019 8:00 A M Medical Record Number: [id number] Patient Account Number: [id number] Date of Birth/Sex: Treating RN: [birth year] (78 y.o. [name] Primary Care Provider: [name]  Other Clinician: Referring Provider: Treating Provider/Extender: Gabriel Rainwater in Treatment: 17 History of Present Illness HPI Description: very pleasant 79 year old gentleman has type 2 diabetes and has been having an ulcer on his foot for several years. Been seeing as this time around since the end of October. Classified as a Wagner grade 2 because his previous x-rays did not show any problems. He has an MRI pending this Friday. discussing with him he says he has not been able to get his diabetic shoes yet. He continues to smoke his pipe and drinks moonshine and says he will not give this up. I have tried to counsel him regarding this but he firmly says that he is happy to listen to me but he is not going to give up his habits. 08/17/14 -- patient has no fresh complaints but has recently come with his MRI which was done on 08/12/2014. 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 Readmission: 07/21/2019 upon evaluation today patient presents for initial inspection here in our clinic concerning issues that he has been having with his bilateral feet. He has open wounds that been present at least since the beginning of the year although he does not know an exact time. He is previously had amputations of all toes and the distal portion of his foot. 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 With that being said he does have a history of diabetes, peripheral vascular disease, nicotine dependence, and hypertension. He has not had any recent arterial or vascular studies he was noncompressible today I think he is going to require referral for arterial studies as well. 2/11; patient was readmitted to our clinic last week. He has bilateral plantar foot wounds in the setting of previous transmetatarsal amputations. He had his arterial studies done on 2/8;  these were actually quite good. He was noncompressible on both sides however his waveforms were triphasic. The thought was noncompressible vessels consistent with medial calcification but without significant stenosis in the lower extremities. X-rays were done on the left this showed extensive chronic and postoperative changes without evidence of osteomyelitis. There was no evidence of osteomyelitis on the right foot there was mild vascular calcification MRI was suggested on the left based on clinical correlation. The patient arrives in clinic with some odor and drainage from the left foot. Right foot measures smaller. He is really not offloading these areas although he says he does not walk much  2/25; the patient arrives with some odor and drainage from the left foot again. Post debridement I cultured this area. He has the area on the midfoot roughly the third metatarsal head and the first metatarsal head on the right. He is in surgical shoes bilateral transmet 3/4; culture from last week showed abundant group B strep and a few Proteus. We will put him on cephalexin starting today. Still silver alginate to the wounds 3/11; due to issues with pharmacy he still does not have the cephalexin that I prescribed a week ago. Apparently this was called into a different pharmacy on Friday but they have not delivered it to him. We have been using silver alginate on the wounds but because of lack of moisture change to silver collagen today. We are offloading as best we can in surgical shoes 3/18; he is taking the cephalexin that I prescribed 2 weeks ago. Using silver collagen. He has surgical shoes. Setting of bilateral previous transmetatarsal amputations. Wounds are mirror-image wounds medially. 3/25; mirror-image wounds in the setting of previous transmetatarsal amputation. The wounds are mirror image medial foot wounds. We have been using silver collagen. We are using surgical shoes offloaded with felt. He  came in today asking about his "$900" custom-made shoes although after talking to them it does not appear that he ever really wore these and they are more than a year old 4/8; mirror-image wounds on the medial part of the plantar feet in the setting of previous transmetatarsal amputations. We have been using silver collagen. I aggressively debrided these 2 weeks ago removing callus subcutaneous tissue from around the wound margins. He did indeed bring echo he has custom-made shoes with transmetatarsal inserts. He has never worn these. He claims to be not spending a lot of time on his feet 4/15; mirror-image wounds on the plantar feet in the setting of previous transmetatarsal amputation. We have been using silver alginate. Culture that I did last time grew MRSA and Proteus. I have him on doxycycline. This was a deep tissue culture. The Proteus was not actively plated against doxycycline but was otherwise pansensitive I think the doxycycline would cover this. His wounds actually looks somewhat better. Rolled senescent edges still around the wound edges 4/29; mirror-image wounds on the plantar feet in the setting of previous bilateral transmetatarsal amputations we have been using silver alginate. Last time I saw him 2 weeks ago I gave him a course of doxycycline. Things seem to have cleaned up. This is in response to a deep tissue culture that showed MRSA and Proteus 5/6; wounds on the bilateral plantar feet in the setting of previous transmetatarsal amputations. We have been using silver alginate. Arrives in the clinic today with the same raised thick callus and subcutaneous tissue. He has home health changing the dressing. He brought up the fact that he has transportation through [facility] [place] apparently he only has "12 visits". Otherwise he has the pay $40 for a cab ride there and back from his home which I guess is [place] [address]. 11/11/19-Patient back with left transmet plantar surface much  worse with extension of devitalized tissue proximally from the original wound with dense callus rim, the right is about the same, apparently patient was wearing tennis shoes at home per home health, although patient swears that he has been using his offloading devices that he has with inserts. 6/8; I have not seen this patient in over a month however he was seen last week in my absence. He has  mirror-image wounds over his bilateral transmit plantar surfaces. He has been using silver alginate. He comes in today with literally a mountain of callus around the 2 wounds on the left. The area on the right somewhat better but still callus and nonviable tissue around the circular wound with some depth. He had an x-ray done last week of the left foot that was negative a culture showed multiple organisms. He did not get any antibiotics. He has custom-made shoes and apparently is getting another pair from [employer] that is in the process. I told him that we are not offloading the sufficiently for what ever reason. I do not think he is wearing his custom shoes at home although he says he is not walking that much Electronic Signature(s) Signed: 11/24/2019 4:17:30 PM By: Linton Ham MD Entered By: Linton Ham on 11/23/2019 09:09:02 -------------------------------------------------------------------------------- Physical Exam Details Patient Name: Date of Service: [place], [name] 11/23/2019 8:00 A M Medical Record Number: [id number] Patient Account Number: [id number] Date of Birth/Sex: Treating RN: [birth year] (78 y.o. [name] Primary Care Provider: [name] Other Clinician: Referring Provider: Treating Provider/Extender: Gabriel Rainwater in Treatment: 43 Constitutional Patient is hypertensive.. Pulse regular and within target range for patient.Marland Kitchen Respirations regular, non-labored and within target range.. Temperature is normal and within the target range  for the patient.Marland Kitchen Appears in no distress. Notes Wound exam; Left foot copious amounts of callus around 2 very wounds. Using a pickup and a #10 scalpel I removed callus subcutaneous tissue. There was an odor but no overt infection. I then used a #5 curette to improve the wound surface hemostasis with silver nitrate On the right I used a #5 curette to remove surrounding callus thick subcutaneous tissue from the wound margins this is still punched out with depth. No overt infection in either wound area Electronic Signature(s) Signed: 11/24/2019 4:17:30 PM By: Linton Ham MD Entered By: Linton Ham on 11/23/2019 09:10:17 -------------------------------------------------------------------------------- Physician Orders Details Patient Name: Date of Service: [name] 11/23/2019 8:00 A M Medical Record Number: [id number] Patient Account Number: [id number] Date of Birth/Sex: Treating RN: 1940-07-17 (78 y.o. [name]) [name] Primary Care Provider: [name] Other Clinician: Referring Provider: Treating Provider/Extender: Gabriel Rainwater in Treatment: 17 Verbal / Phone Orders: No Diagnosis Coding ICD-10 Coding Code Description E11.621 Type 2 diabetes mellitus with foot ulcer L97.522 Non-pressure chronic ulcer of other part of left foot with fat layer exposed L97.512 Non-pressure chronic ulcer of other part of right foot with fat layer exposed I73.89 Other specified peripheral vascular diseases F17.218 Nicotine dependence, cigarettes, with other nicotine-induced disorders I10 Essential (primary) hypertension L84 Corns and callosities Follow-up Appointments ppointment in 1 week. - Thursday Return A Dressing Change Frequency Change dressing three times week. Wound Cleansing Clean wound with Wound Cleanser May shower with protection. Primary Wound Dressing Wound #21 Left,Plantar Foot lginate with Silver - ensure to pack into wound bed  and any undermining. Calcium A Wound #22 Right Metatarsal head first lginate with Silver - ensure to pack into wound bed and any undermining. Calcium A Wound #23 Left,Proximal,Plantar Foot lginate with Silver - ensure to pack into wound bed and any undermining. Calcium A Secondary Dressing Wound #21 Left,Plantar Foot Foam - foam donut to offload Kerlix/Rolled Gauze Dry Gauze Wound #22 Right Metatarsal head first Foam - foam donut to offload Kerlix/Rolled Gauze Dry Gauze Edema Control Avoid standing for long periods of time Elevate legs to the level of  the heart or above for 30 minutes daily and/or when sitting, a frequency of: - throughout the day. Off-Loading Other: - felt patient's personal shoes. patient to wear diabetic shoes. patient to limit about of time spent walking and standing. [facility] for wound care. - [employer] [facility], maintenance) Signed: 11/23/2019 4:34:33 PM By: [name] RN Signed: 11/24/2019 4:17:30 PM By: Linton Ham MD Entered By: [name] on 11/23/2019 08:25:59 -------------------------------------------------------------------------------- Problem List Details Patient Name: Date of Service: [name] 11/23/2019 8:00 A M Medical Record Number: [id number] Patient Account Number: [id number] Date of Birth/Sex: Treating RN: Dec 01, 1940 (78 y.o. [name]) [name] Primary Care Provider: [name] Other Clinician: Referring Provider: Treating Provider/Extender: Gabriel Rainwater in Treatment: 17 Active Problems ICD-10 Encounter Code Description Active Date MDM Diagnosis E11.621 Type 2 diabetes mellitus with foot ulcer 07/21/2019 No Yes L97.522 Non-pressure chronic ulcer of other part of left foot with fat layer exposed 07/21/2019 No Yes L97.512 Non-pressure chronic ulcer of other part of right foot with fat layer exposed 07/21/2019 No Yes I73.89 Other specified  peripheral vascular diseases 07/21/2019 No Yes F17.218 Nicotine dependence, cigarettes, with other nicotine-induced disorders 07/21/2019 No Yes I10 Essential (primary) hypertension 07/21/2019 No Yes L84 Corns and callosities 07/21/2019 No Yes Inactive Problems Resolved Problems Electronic Signature(s) Signed: 11/24/2019 4:17:30 PM By: Linton Ham MD Entered By: Linton Ham on 11/23/2019 09:07:01 -------------------------------------------------------------------------------- Progress Note Details Patient Name: Date of Service: [name] 11/23/2019 8:00 A M Medical Record Number: [id number] Patient Account Number: [id number] Date of Birth/Sex: Treating RN: 05/16/1941 (78 y.o. [name]) [name] Primary Care Provider: [name] Other Clinician: Referring Provider: Treating Provider/Extender: Marlow Baars Weeks in Treatment: 17 Subjective History of Present Illness (HPI) very pleasant 79 year old gentleman has type 2 diabetes and has been having an ulcer on his foot for several years. Been seeing as this time around since the end of October. Classified as a Wagner grade 2 because his previous x-rays did not show any problems. He has an MRI pending this Friday. discussing with him he says he has not been able to get his diabetic shoes yet. He continues to smoke his pipe and drinks moonshine and says he will not give this up. I have tried to counsel him regarding this but he firmly says that he is happy to listen to me but he is not going to give up his habits. 08/17/14 -- patient has no fresh complaints but has recently come with his MRI which was done on 08/12/2014. 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 Readmission: 07/21/2019 upon evaluation today patient presents for initial inspection here in our clinic concerning issues that he has been having  with his bilateral feet. He has open wounds that been present at least since the beginning of the year although he does not know an exact time. He is previously had amputations of all toes and the distal portion of his foot. 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 With that being said he does have a history of diabetes, peripheral vascular disease, nicotine dependence, and hypertension. He has not had any recent arterial or vascular studies he was noncompressible today I think he is going to require referral for arterial studies as well. 2/11; patient was readmitted to our clinic last week. He has bilateral plantar foot wounds in the  setting of previous transmetatarsal amputations. He had his arterial studies done on 2/8; these were actually quite good. He was noncompressible on both sides however his waveforms were triphasic. The thought was noncompressible vessels consistent with medial calcification but without significant stenosis in the lower extremities. X-rays were done on the left this showed extensive chronic and postoperative changes without evidence of osteomyelitis. There was no evidence of osteomyelitis on the right foot there was mild vascular calcification MRI was suggested on the left based on clinical correlation. The patient arrives in clinic with some odor and drainage from the left foot. Right foot measures smaller. He is really not offloading these areas although he says he does not walk much 2/25; the patient arrives with some odor and drainage from the left foot again. Post debridement I cultured this area. He has the area on the midfoot roughly the third metatarsal head and the first metatarsal head on the right. He is in surgical shoes bilateral transmet 3/4; culture from last week showed abundant group B strep and a few Proteus. We will put him on cephalexin starting today. Still silver alginate to the wounds 3/11; due to issues with pharmacy he  still does not have the cephalexin that I prescribed a week ago. Apparently this was called into a different pharmacy on Friday but they have not delivered it to him. We have been using silver alginate on the wounds but because of lack of moisture change to silver collagen today. We are offloading as best we can in surgical shoes 3/18; he is taking the cephalexin that I prescribed 2 weeks ago. Using silver collagen. He has surgical shoes. Setting of bilateral previous transmetatarsal amputations. Wounds are mirror-image wounds medially. 3/25; mirror-image wounds in the setting of previous transmetatarsal amputation. The wounds are mirror image medial foot wounds. We have been using silver collagen. We are using surgical shoes offloaded with felt. He came in today asking about his "$900" custom-made shoes although after talking to them it does not appear that he ever really wore these and they are more than a year old 4/8; mirror-image wounds on the medial part of the plantar feet in the setting of previous transmetatarsal amputations. We have been using silver collagen. I aggressively debrided these 2 weeks ago removing callus subcutaneous tissue from around the wound margins. He did indeed bring echo he has custom-made shoes with transmetatarsal inserts. He has never worn these. He claims to be not spending a lot of time on his feet 4/15; mirror-image wounds on the plantar feet in the setting of previous transmetatarsal amputation. We have been using silver alginate. Culture that I did last time grew MRSA and Proteus. I have him on doxycycline. This was a deep tissue culture. The Proteus was not actively plated against doxycycline but was otherwise pansensitive I think the doxycycline would cover this. His wounds actually looks somewhat better. Rolled senescent edges still around the wound edges 4/29; mirror-image wounds on the plantar feet in the setting of previous bilateral transmetatarsal  amputations we have been using silver alginate. Last time I saw him 2 weeks ago I gave him a course of doxycycline. Things seem to have cleaned up. This is in response to a deep tissue culture that showed MRSA and Proteus 5/6; wounds on the bilateral plantar feet in the setting of previous transmetatarsal amputations. We have been using silver alginate. Arrives in the clinic today with the same raised thick callus and subcutaneous tissue. He has home health changing the  dressing. He brought up the fact that he has transportation [facility] apparently he only has "12 visits". Otherwise he has the pay $40 for a cab ride there and back from his home which I guess is [place] [address]. 11/11/19-Patient back with left transmet plantar surface much worse with extension of devitalized tissue proximally from the original wound with dense callus rim, the right is about the same, apparently patient was wearing tennis shoes at home per home health, although patient swears that he has been using his offloading devices that he has with inserts. 6/8; I have not seen this patient in over a month however he was seen last week in my absence. He has mirror-image wounds over his bilateral transmit plantar surfaces. He has been using silver alginate. He comes in today with literally a mountain of callus around the 2 wounds on the left. The area on the right somewhat better but still callus and nonviable tissue around the circular wound with some depth. He had an x-ray done last week of the left foot that was negative a culture showed multiple organisms. He did not get any antibiotics. He has custom-made shoes and apparently is getting another pair from [employer] that is in the process. I told him that we are not offloading the sufficiently for what ever reason. I do not think he is wearing his custom shoes at home although he says he is not walking that much Objective Constitutional Patient is  hypertensive.. Pulse regular and within target range for patient.Marland Kitchen Respirations regular, non-labored and within target range.. Temperature is normal and within the target range for the patient.Marland Kitchen Appears in no distress. Vitals Time Taken: 8:29 AM, Height: 73 in, Weight: 180 lbs, BMI: 23.7, Temperature: 98.5 F, Pulse: 69 bpm, Respiratory Rate: 19 breaths/min, Blood Pressure: 161/72 mmHg. General Notes: Wound exam; ooLeft foot copious amounts of callus around 2 very wounds. Using a pickup and a #10 scalpel I removed callus subcutaneous tissue. There was an odor but no overt infection. I then used a #5 curette to improve the wound surface hemostasis with silver nitrate ooOn the right I used a #5 curette to remove surrounding callus thick subcutaneous tissue from the wound margins this is still punched out with depth. ooNo overt infection in either wound area Integumentary (Hair, Skin) Wound #21 status is Open. Original cause of wound was Gradually Appeared. The wound is located on the [place]. The wound measures 0.8cm length x 1cm width x 0.7cm depth; 0.628cm^2 area and 0.44cm^3 volume. There is Fat Layer (Subcutaneous Tissue) Exposed exposed. There is no tunneling or undermining noted. There is a medium amount of serosanguineous drainage noted. The wound margin is thickened. There is medium (34-66%) red, pink granulation within the wound bed. There is a medium (34-66%) amount of necrotic tissue within the wound bed including Adherent Slough. Wound #22 status is Open. Original cause of wound was Gradually Appeared. The wound is located on the Right Metatarsal head first. The wound measures 1cm length x 1.4cm width x 0.7cm depth; 1.1cm^2 area and 0.77cm^3 volume. There is Fat Layer (Subcutaneous Tissue) Exposed exposed. There is no tunneling or undermining noted. There is a medium amount of serosanguineous drainage noted. The wound margin is thickened. There is medium (34-66%) red, pink  granulation within the wound bed. There is a medium (34-66%) amount of necrotic tissue within the wound bed including Adherent Slough. Wound #23 status is Open. Original cause of wound was Gradually Appeared. The wound is located  on the Left,Proximal,Plantar Foot. The wound measures 0.7cm length x 0.4cm width x 0.6cm depth; 0.22cm^2 area and 0.132cm^3 volume. There is Fat Layer (Subcutaneous Tissue) Exposed exposed. There is no tunneling or undermining noted. There is a medium amount of serosanguineous drainage noted. The wound margin is well defined and not attached to the wound base. There is medium (34-66%) pink, pale granulation within the wound bed. There is a medium (34-66%) amount of necrotic tissue within the wound bed including Adherent Slough. 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 There was a Excisional Skin/Subcutaneous Tissue Debridement with a total area of 0.8 sq cm performed by Ricard Dillon., MD. With the following instrument(s): Blade, Curette, and Forceps to remove Viable and Non-Viable tissue/material. Material removed includes Callus, Subcutaneous Tissue, Skin: Dermis, and Skin: Epidermis after achieving pain control using Lidocaine 5% topical ointment. No specimens were taken. A time out was conducted at 08:58, prior to the start of the procedure. A Moderate amount of bleeding was controlled with Silver Nitrate. The  procedure was tolerated well with a pain level of 0 throughout and a pain level of 0 following the procedure. Post Debridement Measurements: 0.8cm length x 1cm width x 0.7cm depth; 0.44cm^3 volume. Character of Wound/Ulcer Post Debridement is improved. Severity of Tissue Post Debridement is: Fat layer exposed. 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 There was a Excisional Skin/Subcutaneous Tissue Debridement with a total area of 1.4 sq cm performed by Ricard Dillon., MD. With the following instrument(s): Blade, Curette, and Forceps to remove Viable and Non-Viable tissue/material. Material removed includes Callus, Subcutaneous Tissue, Skin: Dermis, and Skin: Epidermis after achieving pain control using Lidocaine 5% topical ointment. No specimens were taken. A time out was conducted at 08:58, prior to the start of the procedure. A Moderate amount of bleeding was controlled with Silver Nitrate. The procedure was tolerated well with a pain level of 0 throughout and a pain level of 0 following the procedure. Post Debridement Measurements: 1cm length x 1.4cm width x 0.7cm depth; 0.77cm^3 volume. Character of Wound/Ulcer Post Debridement is improved. Severity of Tissue Post Debridement is: Fat layer exposed. Post procedure Diagnosis Wound #22: Same as Pre-Procedure Wound #23 Pre-procedure diagnosis of Wound #23 is a Diabetic Wound/Ulcer of the Lower Extremity located on the Left,Proximal,Plantar Foot .Severity of Tissue Pre Debridement is: Fat layer exposed. There was a Excisional Skin/Subcutaneous Tissue Debridement with a total area of 0.28 sq cm performed by Ricard Dillon., MD. With the following instrument(s): Blade, Curette, and Forceps to remove Viable and Non-Viable  tissue/material. Material removed includes Callus, Subcutaneous Tissue, Skin: Dermis, and Skin: Epidermis after achieving pain control using Lidocaine 5% topical ointment. No specimens were taken. A time out was conducted at 08:58, prior to the start of the procedure. A Moderate amount of bleeding was controlled with Silver Nitrate. The procedure was tolerated  well with a pain level of 0 throughout and a pain level of 0 following the procedure. Post Debridement Measurements: 0.7cm length x 0.4cm width x 0.6cm depth; 0.132cm^3 volume. Character of Wound/Ulcer Post Debridement is improved. Severity of Tissue Post Debridement is: Fat layer exposed. Post procedure Diagnosis Wound #23: Same as Pre-Procedure Plan Follow-up Appointments: Return Appointment in 1 week. - Thursday Dressing Change Frequency: Change dressing three times week. Wound Cleansing: Clean wound with Wound Cleanser May shower with protection. Primary Wound Dressing: Wound #21 Left,Plantar Foot: Calcium Alginate with Silver - ensure to pack into wound bed and any undermining. Wound #22 Right Metatarsal head first: Calcium Alginate with Silver - ensure to pack into wound bed and any undermining. Wound #23 Left,Proximal,Plantar Foot: Calcium Alginate with Silver - ensure to pack into wound bed and any undermining. Secondary Dressing: Wound #21 Left,Plantar Foot: Foam - foam donut to offload Kerlix/Rolled Gauze Dry Gauze Wound #22 Right Metatarsal head first: Foam - foam donut to offload Kerlix/Rolled Gauze Dry Gauze Edema Control: Avoid standing for long periods of time Elevate legs to the level of the heart or above for 30 minutes daily and/or when sitting, a frequency of: - throughout the day. Off-Loading: Other: - felt patient's personal shoes. patient to wear diabetic shoes. patient to limit about of time spent walking and standing. Home Health: [facility] for wound care. - [facility].  Continue with silver alginate to the bilateral plantar foot wounds 2. I saw no evidence of infection although there was an odor on the left. Culture and x-rays from last time on the left were negative 3. I do not think he is offloading these adequately and I talked to him about this. We may have to consider a total contact cast. 4. He tells me that [employer] is making him another shoe. The shoe he has seems larger than the foot he has although he does have a orthotic in the tip of the left shoe Electronic Signature(s) Signed: 11/24/2019 4:17:30 PM By: Linton Ham MD Entered By: Linton Ham on 11/23/2019 09:11:20 -------------------------------------------------------------------------------- SuperBill Details Patient Name: Date of Service: [name], [name] 11/23/2019 Medical Record Number: [id number] Patient Account Number: [id number] Date of Birth/Sex: Treating RN: 1941/01/13 (78 y.o. [name]) [name] Primary Care Provider: [name] Other Clinician: Referring Provider: Treating Provider/Extender: Marlow Baars Weeks in Treatment: 17 Diagnosis Coding ICD-10 Codes Code Description E11.621 Type 2 diabetes mellitus with foot ulcer L97.522 Non-pressure chronic ulcer of other part of left foot with fat layer exposed L97.512 Non-pressure chronic ulcer of other part of right foot with fat layer exposed I73.89 Other specified peripheral vascular diseases F17.218 Nicotine dependence, cigarettes, with other nicotine-induced disorders I10 Essential (primary) hypertension L84 Corns and callosities Facility Procedures The patient participates with Medicare or their insurance follows the Medicare Facility Guidelines: CPT4 Code Description Modifier Quantity [id number] 11042 - DEB SUBQ TISSUE 20 SQ CM/< 1 ICD-10 Diagnosis Description L97.522 Non-pressure chronic ulcer of  other part of left foot with fat layer exposed L97.512 Non-pressure chronic ulcer of other  part of right foot with fat layer exposed Physician Procedures : CPT4 Code Description Modifier [id number] - WC PHYS SUBQ TISS 20 SQ CM ICD-10 Diagnosis Description L97.522 Non-pressure chronic ulcer of other part of left foot with fat layer exposed L97.512 Non-pressure chronic ulcer of other part of right foot  with fat layer exposed Quantity: 1 Electronic Signature(s) Signed: 11/24/2019 4:17:30 PM By: Linton Ham MD Entered By:  Linton Ham on 11/23/2019 09:11:38

## 2019-11-25 NOTE — Progress Notes (Signed)
JAVIEN, TESCH ([id number]) Visit Report for 11/23/2019 Arrival Information Details Patient Name: Date of Service: [name], [name] [place] ND 11/23/2019 8:00 A M Medical Record Number: [id number] Patient Account Number: [id number] Date of Birth/Sex: Treating RN: 03-27-1941 (78 y.o. [name]) [name] Primary Care Sheriann Newmann: [name] Other Clinician: Referring Maurilio Puryear: Treating Lanorris Kalisz/Extender: Gabriel Rainwater in Treatment: 81 Visit Information History Since Last Visit Added or deleted any medications: No Patient Arrived: [name] Any new allergies or adverse reactions: No Arrival Time: 08:29 Had a fall or experienced change in No Accompanied By: self activities of daily living that may affect Transfer Assistance: None risk of falls: Patient Identification Verified: Yes Signs or symptoms of abuse/neglect since last visito No Secondary Verification Process Completed: Yes Hospitalized since last visit: No Patient Requires Transmission-Based Precautions: No Implantable device outside of the clinic excluding No Patient Has Alerts: Yes cellular tissue based products placed in the center Patient Alerts: R ABI non compressible since last visit: L ABI non compressible Has Dressing in Place as Prescribed: Yes Pain Present Now: Yes Electronic Signature(s) Signed: 11/25/2019 4:20:07 PM By: Sandre Kitty Entered By: Sandre Kitty on 11/23/2019 08:29:38 -------------------------------------------------------------------------------- Encounter Discharge Information Details Patient Name: Date of Service: [name], [name] 11/23/2019 8:00 A M Medical Record Number: [id number] Patient Account Number: [id number] Date of Birth/Sex: Treating RN: [birth year] (79 y.o. [name] Primary Care Justice Aguirre: [name] Other Clinician: Referring Ahlayah Tarkowski: Treating Djuna Frechette/Extender: Gabriel Rainwater in Treatment: 17 Encounter  Discharge Information Items Post Procedure Vitals Discharge Condition: Stable Temperature (F): 98.5 Ambulatory Status: Cane Pulse (bpm): 69 Discharge Destination: Home Respiratory Rate (breaths/min): 19 Transportation: Other Blood Pressure (mmHg): 161/72 Accompanied By: self Schedule Follow-up Appointment: Yes Clinical Summary of Care: Patient Declined Electronic Signature(s) Signed: 11/23/2019 4:53:58 PM By: Kela Millin Entered By: Kela Millin on 11/23/2019 09:18:41 -------------------------------------------------------------------------------- Lower Extremity Assessment Details Patient Name: Date of Service: [name], [name] 11/23/2019 8:00 A M Medical Record Number: [id number] Patient Account Number: [id number] Date of Birth/Sex: Treating RN: 1941-04-10 (78 y.o. [name]) [name] Primary Care Juanantonio Stolar: [name] Other Clinician: Referring Aika Brzoska: Treating Torryn Hudspeth/Extender: Wynelle Bourgeois, Ronie Spies Weeks in Treatment: 17 Edema Assessment Assessed: [name]: No] [Right: No] Edema: [Left: Yes] [Right: Yes] Calf Left: Right: Point of Measurement: 32 cm From Medial Instep 31 cm 32 cm Ankle Left: Right: Point of Measurement: 10 cm From Medial Instep 23.5 cm 26.5 cm Electronic Signature(s) Signed: 11/23/2019 4:34:33 PM By: [name] RN Entered By: [name] on 11/23/2019 08:49:51 -------------------------------------------------------------------------------- Multi Wound Chart Details Patient Name: Date of Service: [name], [name] 11/23/2019 8:00 A M Medical Record Number: [id number] Patient Account Number: [id number] Date of Birth/Sex: Treating RN: [birth year] (78 y.o. [name]) [name] Primary Care Coretta Leisey: [name] Other Clinician: Referring Briyah Wheelwright: Treating Dinnis Rog/Extender: Marlow Baars Weeks in Treatment: 17 Vital Signs Height(in): 29 Pulse(bpm): 92 Weight(lbs): 180 Blood  Pressure(mmHg): 161/72 Body Mass Index(BMI): 24 Temperature(F): 98.5 Respiratory Rate(breaths/min): 19 Photos: [21:No Photos Left, Plantar Foot] [22:No Photos Right Metatarsal head first] [23:No Photos Left, Proximal, Plantar Foot] Wound Location: [21:Gradually Appeared] [22:Gradually Appeared] [23:Gradually Appeared] Wounding Event: [21:Diabetic Wound/Ulcer of the Lower] [22:Diabetic Wound/Ulcer of the Lower] [23:Diabetic Wound/Ulcer of the Lower] Primary Etiology: [21:Extremity Cataracts, Glaucoma, Hypertension,] [22:Extremity Cataracts, Glaucoma, Hypertension,] [23:Extremity Cataracts, Glaucoma, Hypertension,] Comorbid History: [21:Peripheral Arterial Disease, Type II Diabetes, Osteomyelitis, Neuropathy 06/17/2018] [22:Peripheral Arterial Disease, Type II Diabetes, Osteomyelitis, Neuropathy 06/17/2018] [23:Peripheral Arterial Disease, Type II Diabetes,  Osteomyelitis, Neuropathy 11/11/2019] Date Acquired: [21:17] [22:17] [23:1] Weeks of Treatment: [21:Open] [22:Open] [23:Open] Wound Status: [21:0.8x1x0.7] [22:1x1.4x0.7] [23:0.7x0.4x0.6] Measurements L x W x D (cm) [21:0.628] [22:1.1] [23:0.22] A (cm) : rea [21:0.44] [22:0.77] [23:0.132] Volume (cm) : [21:72.80%] [22:-75.20%] [23:-40.10%] % Reduction in Area: [21:61.90%] [22:-309.60%] [23:6.40%] % Reduction in Volume: [21:Grade 2] [22:Grade 2] [23:Grade 2] Classification: [21:Medium] [22:Medium] [23:Medium] Exudate A mount: [21:Serosanguineous] [22:Serosanguineous] [23:Serosanguineous] Exudate Type: [21:red, brown] [22:red, brown] [23:red, brown] Exudate Color: [21:Thickened] [22:Thickened] [23:Well defined, not attached] Wound Margin: [21:Medium (34-66%)] [22:Medium (34-66%)] [23:Medium (34-66%)] Granulation A mount: [21:Red, Pink] [22:Red, Pink] [23:Pink, Pale] Granulation Quality: [21:Medium (34-66%)] [22:Medium (34-66%)] [23:Medium (34-66%)] Necrotic A mount: [21:Fat Layer (Subcutaneous Tissue)] [22:Fat Layer (Subcutaneous Tissue)]  [23:Fat Layer (Subcutaneous Tissue)] Exposed Structures: [21:Exposed: Yes Fascia: No Tendon: No Muscle: No Joint: No Bone: No None] [22:Exposed: Yes Fascia: No Tendon: No Muscle: No Joint: No Bone: No None] [23:Exposed: Yes Fascia: No Tendon: No Muscle: No Joint: No Bone: No None] Epithelialization: [21:Debridement - Excisional] [22:Debridement - Excisional] [23:Debridement - Excisional] Debridement: Pre-procedure Verification/Time Out 08:58 [22:08:58] [23:08:58] Taken: [21:Lidocaine 5% topical ointment] [22:Lidocaine 5% topical ointment] [23:Lidocaine 5% topical ointment] Pain Control: [21:Callus, Subcutaneous] [22:Callus, Subcutaneous] [23:Callus, Subcutaneous] Tissue Debrided: [21:Skin/Subcutaneous Tissue] [22:Skin/Subcutaneous Tissue] [23:Skin/Subcutaneous Tissue] Level: [21:0.8] [22:1.4] [23:0.28] Debridement A (sq cm): [21:rea Blade, Curette, Forceps] [22:Blade, Curette, Forceps] [23:Blade, Curette, Forceps] Instrument: [21:Moderate] [22:Moderate] [23:Moderate] Bleeding: [21:Silver Nitrate] [22:Silver Nitrate] [23:Silver Nitrate] Hemostasis A chieved: [21:0] [22:0] [23:0] Procedural Pain: [21:0] [22:0] [23:0] Post Procedural Pain: [21:Procedure was tolerated well] [22:Procedure was tolerated well] [23:Procedure was tolerated well] Debridement Treatment Response: [21:0.8x1x0.7] [22:1x1.4x0.7] [23:0.7x0.4x0.6] Post Debridement Measurements L x W x D (cm) [21:0.44] [22:0.77] [23:0.132] Post Debridement Volume: (cm) [21:Debridement] [22:Debridement] [23:Debridement] Treatment Notes Electronic Signature(s) Signed: 11/23/2019 4:34:33 PM By: [name] RN Signed: 11/24/2019 4:17:30 PM By: Linton Ham MD Entered By: Linton Ham on 11/23/2019 09:07:08 -------------------------------------------------------------------------------- Multi-Disciplinary Care Plan Details Patient Name: Date of Service: [name], [name] 11/23/2019 8:00 A M Medical Record Number: [id number] Patient Account  Number: [id number] Date of Birth/Sex: Treating RN: Aug 14, 1940 (78 y.o. Oval Linsey Primary Care Kenta Laster: [name] Other Clinician: Referring Silvia Markuson: Treating Garrison Michie/Extender: Gabriel Rainwater in Treatment: 17 Active Inactive Abuse / Safety / Falls / Self Care Management Nursing Diagnoses: Potential for falls Goals: Patient/caregiver will verbalize/demonstrate measures taken to prevent injury and/or falls Date Initiated: 07/21/2019 Target Resolution Date: 12/13/2019 Goal Status: Active Interventions: Assess fall risk on admission and as needed Assess impairment of mobility on admission and as needed per policy Notes: Electronic Signature(s) Signed: 11/23/2019 4:34:33 PM By: [name] RN Entered By: [name] on 11/23/2019 08:26:34 -------------------------------------------------------------------------------- Pain Assessment Details Patient Name: Date of Service: [name], [name] 11/23/2019 8:00 A M Medical Record Number: [id number] Patient Account Number: [id number] Date of Birth/Sex: Treating RN: [birth year] (78 y.o. [name]) [name] Primary Care Sheridan Hew: [name] Other Clinician: Referring Emmaly Leech: Treating Yuvia Plant/Extender: Marlow Baars Weeks in Treatment: 17 Active Problems Location of Pain Severity and Description of Pain Patient Has Paino No Site Locations Pain Management and Medication Current Pain Management: Electronic Signature(s) Signed: 11/23/2019 4:34:33 PM By: [name] RN Signed: 11/25/2019 4:20:07 PM By: Sandre Kitty Entered By: Sandre Kitty on 11/23/2019 08:30:00 -------------------------------------------------------------------------------- Patient/Caregiver Education Details Patient Name: Date of Service: [name], [name] 6/8/2021andnbsp8:00 A M Medical Record Number: [id number] Patient Account Number: [id number] Date of Birth/Gender: Treating  RN: Jun 30, 1940 (78 y.o. [name]) [name] Primary Care Physician: Fara Olden,  [name] Other Clinician: Referring Physician: Treating Physician/Extender: Gabriel Rainwater in Treatment: 61 Education Assessment Education Provided To: Patient Education Topics Provided Wound/Skin Impairment: Methods: Explain/Verbal Responses: State content correctly Electronic Signature(s) Signed: 11/23/2019 4:34:33 PM By: [name] RN Entered By: [name] on 11/23/2019 08:26:49 -------------------------------------------------------------------------------- Wound Assessment Details Patient Name: Date of Service: [name], [name] 11/23/2019 8:00 A M Medical Record Number: [id number] Patient Account Number: [id number] Date of Birth/Sex: Treating RN: 1940-10-10 (78 y.o. [name]) [name] Primary Care Estefano Victory: [name] Other Clinician: Referring Janina Trafton: Treating Prisila Dlouhy/Extender: Marlow Baars Weeks in Treatment: 17 Wound Status Wound Number: 21 Primary Diabetic Wound/Ulcer of the Lower Extremity Etiology: Wound Location: Left, Plantar Foot Wound Open Wounding Event: Gradually Appeared Status: Date Acquired: 06/17/2018 Comorbid Cataracts, Glaucoma, Hypertension, Peripheral Arterial Disease, Weeks Of Treatment: 17 History: Type II Diabetes, Osteomyelitis, Neuropathy Clustered Wound: No Photos Photo Uploaded By: Mikeal Hawthorne on 11/24/2019 11:30:45 Wound Measurements Length: (cm) 0.8 Width: (cm) 1 Depth: (cm) 0.7 Area: (cm) 0.628 Volume: (cm) 0.44 % Reduction in Area: 72.8% % Reduction in Volume: 61.9% Epithelialization: None Tunneling: No Undermining: No Wound Description Classification: Grade 2 Wound Margin: Thickened Exudate Amount: Medium Exudate Type: Serosanguineous Exudate Color: red, brown Foul Odor After Cleansing: No Slough/Fibrino Yes Wound Bed Granulation Amount: Medium (34-66%) Exposed  Structure Granulation Quality: Red, Pink Fascia Exposed: No Necrotic Amount: Medium (34-66%) Fat Layer (Subcutaneous Tissue) Exposed: Yes Necrotic Quality: Adherent Slough Tendon Exposed: No Muscle Exposed: No Joint Exposed: No Bone Exposed: No Treatment Notes Wound #21 (Left, Plantar Foot) 1. Cleanse With Wound Cleanser 3. Primary Dressing Applied Calcium Alginate Ag 4. Secondary Dressing Dry Gauze Roll Gauze Foam 5. Secured With Tape Notes foam donut Electronic Signature(s) Signed: 11/23/2019 4:34:33 PM By: [name] RN Entered By: [name] on 11/23/2019 08:50:18 -------------------------------------------------------------------------------- Wound Assessment Details Patient Name: Date of Service: [name], [name] 11/23/2019 8:00 A M Medical Record Number: [id number] Patient Account Number: [id number] Date of Birth/Sex: Treating RN: 16-Aug-1940 (78 y.o. [name]) [name] Primary Care Jayma Volpi: [name] Other Clinician: Referring Franz Svec: Treating Faustino Luecke/Extender: Marlow Baars Weeks in Treatment: 17 Wound Status Wound Number: 22 Primary Diabetic Wound/Ulcer of the Lower Extremity Etiology: Wound Location: Right Metatarsal head first Wound Open Wounding Event: Gradually Appeared Status: Date Acquired: 06/17/2018 Comorbid Cataracts, Glaucoma, Hypertension, Peripheral Arterial Disease, Weeks Of Treatment: 17 History: Type II Diabetes, Osteomyelitis, Neuropathy Clustered Wound: No Photos Photo Uploaded By: Mikeal Hawthorne on 11/24/2019 11:30:46 Wound Measurements Length: (cm) 1 Width: (cm) 1.4 Depth: (cm) 0.7 Area: (cm) 1.1 Volume: (cm) 0.77 % Reduction in Area: -75.2% % Reduction in Volume: -309.6% Epithelialization: None Tunneling: No Undermining: No Wound Description Classification: Grade 2 Wound Margin: Thickened Exudate Amount: Medium Exudate Type: Serosanguineous Exudate Color: red, brown Foul Odor  After Cleansing: No Slough/Fibrino Yes Wound Bed Granulation Amount: Medium (34-66%) Exposed Structure Granulation Quality: Red, Pink Fascia Exposed: No Necrotic Amount: Medium (34-66%) Fat Layer (Subcutaneous Tissue) Exposed: Yes Necrotic Quality: Adherent Slough Tendon Exposed: No Muscle Exposed: No Joint Exposed: No Bone Exposed: No Treatment Notes Wound #22 (Right Metatarsal head first) 1. Cleanse With Wound Cleanser 3. Primary Dressing Applied Calcium Alginate Ag 4. Secondary Dressing Dry Gauze Roll Gauze Foam 5. Secured With Tape Notes foam donut Electronic Signature(s) Signed: 11/23/2019 4:34:33 PM By: [name] RN Entered By: [name] on 11/23/2019 08:50:40 -------------------------------------------------------------------------------- Wound Assessment Details Patient Name: Date of Service: [name], [name] 11/23/2019 8:00 A M  Medical Record Number: [id number] Patient Account Number: [id number] Date of Birth/Sex: Treating RN: 10/08/1940 (78 y.o. [name]) [name] Primary Care Tahlor Berenguer: [name] Other Clinician: Referring Jasamine Pottinger: Treating Anessia Oakland/Extender: Gabriel Rainwater in Treatment: 17 Wound Status Wound Number: 23 Primary Diabetic Wound/Ulcer of the Lower Extremity Etiology: Wound Location: Left, Proximal, Plantar Foot Wound Open Wounding Event: Gradually Appeared Status: Date Acquired: 11/11/2019 Comorbid Cataracts, Glaucoma, Hypertension, Peripheral Arterial Disease, Weeks Of Treatment: 1 History: Type II Diabetes, Osteomyelitis, Neuropathy Clustered Wound: No Photos Photo Uploaded By: Mikeal Hawthorne on 11/24/2019 11:31:15 Wound Measurements Length: (cm) 0.7 Width: (cm) 0.4 Depth: (cm) 0.6 Area: (cm) 0.22 Volume: (cm) 0.132 % Reduction in Area: -40.1% % Reduction in Volume: 6.4% Epithelialization: None Tunneling: No Undermining: No Wound Description Classification: Grade 2 Wound  Margin: Well defined, not attached Exudate Amount: Medium Exudate Type: Serosanguineous Exudate Color: red, brown Foul Odor After Cleansing: No Slough/Fibrino Yes Wound Bed Granulation Amount: Medium (34-66%) Exposed Structure Granulation Quality: Pink, Pale Fascia Exposed: No Necrotic Amount: Medium (34-66%) Fat Layer (Subcutaneous Tissue) Exposed: Yes Necrotic Quality: Adherent Slough Tendon Exposed: No Muscle Exposed: No Joint Exposed: No Bone Exposed: No Treatment Notes Wound #23 (Left, Proximal, Plantar Foot) 1. Cleanse With Wound Cleanser 3. Primary Dressing Applied Calcium Alginate Ag 4. Secondary Dressing Dry Gauze Roll Gauze Foam 5. Secured With Tape Notes foam donut Electronic Signature(s) Signed: 11/23/2019 4:34:33 PM By: [name] RN Entered By: [name] on 11/23/2019 08:51:40 -------------------------------------------------------------------------------- Vitals Details Patient Name: Date of Service: [name], [name] 11/23/2019 8:00 A M Medical Record Number: [id number] Patient Account Number: [id number] Date of Birth/Sex: Treating RN: August 15, 1940 (78 y.o. [name]) [name] Primary Care Nelida Mandarino: [name] Other Clinician: Referring Sona Nations: Treating Hebert Dooling/Extender: Gabriel Rainwater in Treatment: 17 Vital Signs Time Taken: 08:29 Temperature (F): 98.5 Height (in): 73 Pulse (bpm): 69 Weight (lbs): 180 Respiratory Rate (breaths/min): 19 Body Mass Index (BMI): 23.7 Blood Pressure (mmHg): 161/72 Reference Range: 80 - 120 mg / dl Electronic Signature(s) Signed: 11/25/2019 4:20:07 PM By: Sandre Kitty Entered By: Sandre Kitty on 11/23/2019 08:29:54

## 2019-12-02 ENCOUNTER — Encounter (HOSPITAL_BASED_OUTPATIENT_CLINIC_OR_DEPARTMENT_OTHER): Payer: Medicare Other | Admitting: Internal Medicine

## 2019-12-02 ENCOUNTER — Other Ambulatory Visit: Payer: Self-pay

## 2019-12-02 DIAGNOSIS — E11621 Type 2 diabetes mellitus with foot ulcer: Secondary | ICD-10-CM | POA: Diagnosis not present

## 2019-12-03 NOTE — Progress Notes (Signed)
LARREN, COPES ([id number]) Visit Report for 12/02/2019 Arrival Information Details Patient Name: Date of Service: [name], [place] 12/02/2019 9:00 A M Medical Record Number: [id number] Patient Account Number: [id number] Date of Birth/Sex: Treating RN: Jun 17, 1941 (79 y.o. [name] Primary Care Clayton Jarmon: [name] Other Clinician: Referring Stoy Fenn: Treating Quinlyn Tep/Extender: Gabriel Rainwater in Treatment: 46 Visit Information History Since Last Visit Added or deleted any medications: No Patient Arrived: Cane Any new allergies or adverse reactions: No Arrival Time: 08:58 Had a fall or experienced change in No Accompanied By: self activities of daily living that may affect Transfer Assistance: None risk of falls: Patient Identification Verified: Yes Signs or symptoms of abuse/neglect since last visito No Secondary Verification Process Completed: Yes Hospitalized since last visit: No Patient Requires Transmission-Based Precautions: No Implantable device outside of the clinic excluding No Patient Has Alerts: Yes cellular tissue based products placed in the center Patient Alerts: R ABI non compressible since last visit: L ABI non compressible Has Dressing in Place as Prescribed: Yes Pain Present Now: No Electronic Signature(s) Signed: 12/02/2019 4:29:25 PM By: Kela Millin Entered By: Kela Millin on 12/02/2019 08:58:32 -------------------------------------------------------------------------------- Encounter Discharge Information Details Patient Name: Date of Service: [name], [name] 12/02/2019 9:00 A M Medical Record Number: [id number] Patient Account Number: [id number] Date of Birth/Sex: Treating RN: February 19, 1941 (79 y.o. [name] Primary Care Dierdre Mccalip: [name] Other Clinician: Referring Hanni Milford: Treating Chanler Mendonca/Extender: Gabriel Rainwater in Treatment:  19 Encounter Discharge Information Items Post Procedure Vitals Discharge Condition: Stable Temperature (F): 98 Ambulatory Status: Cane Pulse (bpm): 75 Discharge Destination: Home Respiratory Rate (breaths/min): 19 Transportation: Private Auto Blood Pressure (mmHg): 115/73 Accompanied By: self Schedule Follow-up Appointment: Yes Clinical Summary of Care: Electronic Signature(s) Signed: 12/02/2019 5:45:21 PM By: Deon Pilling Entered By: Deon Pilling on 12/02/2019 10:12:55 -------------------------------------------------------------------------------- Lower Extremity Assessment Details Patient Name: Date of Service: [name], [name] 12/02/2019 9:00 A M Medical Record Number: [id number] Patient Account Number: [id number] Date of Birth/Sex: Treating RN: 1940-08-06 (79 y.o. [name] Primary Care Martin Smeal: [name] Other Clinician: Referring Sylvie Mifsud: Treating Shaelee Forni/Extender: Gabriel Rainwater in Treatment: 19 Edema Assessment Assessed: [name]: No] Patrice Paradise: No] Edema: [Left: Yes] [Right: Yes] Calf Left: Right: Point of Measurement: 32 cm From Medial Instep 31 cm 36 cm Ankle Left: Right: Point of Measurement: 10 cm From Medial Instep 23.5 cm 26.5 cm Vascular Assessment Pulses: Dorsalis Pedis Palpable: [Left:Yes] [Right:Yes] Electronic Signature(s) Signed: 12/02/2019 4:29:25 PM By: Kela Millin Entered By: Kela Millin on 12/02/2019 09:00:05 -------------------------------------------------------------------------------- Multi Wound Chart Details Patient Name: Date of Service: [name], [name] 12/02/2019 9:00 A M Medical Record Number: [id number] Patient Account Number: [id number] Date of Birth/Sex: Treating RN: [birth year] (79 y.o. [name], Meta.Reding Primary Care Chloeanne Poteet: [name] Other Clinician: Referring Trachelle Low: Treating Weda Baumgarner/Extender: Gabriel Rainwater in  Treatment: 19 Vital Signs Height(in): 73 Pulse(bpm): 75 Weight(lbs): 180 Blood Pressure(mmHg): 115/73 Body Mass Index(BMI): 24 Temperature(F): 98 Respiratory Rate(breaths/min): 26 Photos: [21:No Photos Left, Plantar Foot] [22:No Photos Right Metatarsal head first] [23:No Photos Left, Proximal, Plantar Foot] Wound Location: [21:Gradually Appeared] [22:Gradually Appeared] [23:Gradually Appeared] Wounding Event: [21:Diabetic Wound/Ulcer of the Lower] [22:Diabetic Wound/Ulcer of the Lower] [23:Diabetic Wound/Ulcer of the Lower] Primary Etiology: [21:Extremity Cataracts, Glaucoma, Hypertension,] [22:Extremity Cataracts, Glaucoma, Hypertension,] [23:Extremity Cataracts, Glaucoma, Hypertension,] Comorbid History: [21:Peripheral Arterial Disease, Type II Diabetes, Osteomyelitis, Neuropathy 06/17/2018] [22:Peripheral Arterial Disease, Type II Diabetes, Osteomyelitis, Neuropathy 06/17/2018] [23:Peripheral  Arterial Disease, Type II Diabetes,  Osteomyelitis, Neuropathy 11/11/2019] Date Acquired: [21:19] [22:19] [23:3] Weeks of Treatment: [21:Open] [22:Open] [23:Open] Wound Status: [21:1.5x0.4x0.7] [22:0.8x1.2x0.3] [23:0x0x0] Measurements L x W x D (cm) [21:0.471] [22:0.754] [23:0] A (cm) : rea [21:0.33] [22:0.226] [23:0] Volume (cm) : [21:79.60%] [22:-20.10%] [23:100.00%] % Reduction in A rea: [21:71.40%] [22:-20.20%] [23:100.00%] % Reduction in Volume: [21:12] Starting Position 1 (o'clock): [21:12] Ending Position 1 (o'clock): [21:0.4] Maximum Distance 1 (cm): [21:Yes] [22:No] [23:No] Undermining: [21:Grade 2] [22:Grade 2] [23:Grade 2] Classification: [21:Medium] [22:Medium] [23:None Present] Exudate A mount: [21:Serosanguineous] [22:Serosanguineous] [23:N/A] Exudate Type: [21:red, brown] [22:red, brown] [23:N/A] Exudate Color: [21:Thickened] [22:Thickened] [23:Distinct, outline attached] Wound Margin: [21:Medium (34-66%)] [22:Large (67-100%)] [23:None Present (0%)] Granulation A mount: [21:Red,  Pink] [22:Red, Pink] [23:N/A] Granulation Quality: [21:None Present (0%)] [22:None Present (0%)] [23:None Present (0%)] Necrotic A mount: [21:Fat Layer (Subcutaneous Tissue)] [22:Fat Layer (Subcutaneous Tissue)] [23:Fascia: No] Exposed Structures: [21:Exposed: Yes Fascia: No Tendon: No Muscle: No Joint: No Bone: No None] [22:Exposed: Yes Fascia: No Tendon: No Muscle: No Joint: No Bone: No None] [23:Fat Layer (Subcutaneous Tissue) Exposed: No Tendon: No Muscle: No Joint: No Bone: No Large  (67-100%)] Epithelialization: [21:Debridement - Selective/Open Wound] [22:N/A] [23:N/A] Debridement: Pre-procedure Verification/Time Out 09:16 [22:N/A] [23:N/A] Taken: [21:Lidocaine 4% Topical Solution] [22:N/A] [23:N/A] Pain Control: [21:Callus] [22:N/A] [23:N/A] Tissue Debrided: [21:Non-Viable Tissue] [22:N/A] [23:N/A] Level: [21:0.25] [22:N/A] [23:N/A] Debridement A (sq cm): [21:rea Curette] [22:N/A] [23:N/A] Instrument: [21:Minimum] [22:N/A] [23:N/A] Bleeding: [21:Pressure] [22:N/A] [23:N/A] Hemostasis A chieved: [21:0] [22:N/A] [23:N/A] Procedural Pain: [21:0] [22:N/A] [23:N/A] Post Procedural Pain: [21:Procedure was tolerated well] [22:N/A] [23:N/A] Debridement Treatment Response: [21:1.5x0.4x0.7] [22:N/A] [23:N/A] Post Debridement Measurements L x W x D (cm) [21:0.33] [22:N/A] [23:N/A] Post Debridement Volume: (cm) [21:Debridement] [22:N/A] [23:N/A] Treatment Notes Electronic Signature(s) Signed: 12/02/2019 5:45:21 PM By: Deon Pilling Signed: 12/03/2019 7:45:08 AM By: Linton Ham MD Entered By: Linton Ham on 12/02/2019 09:21:59 -------------------------------------------------------------------------------- Multi-Disciplinary Care Plan Details Patient Name: Date of Service: [name], [name] 12/02/2019 9:00 A M Medical Record Number: [id number] Patient Account Number: [id number] Date of Birth/Sex: Treating RN: Mar 16, 1941 (79 y.o. [name] Primary Care Tedd Cottrill: [name] Other Clinician: Referring Burk Hoctor: Treating Athene Schuhmacher/Extender: Gabriel Rainwater in Treatment: 6 Active Inactive Abuse / Safety / Falls / Self Care Management Nursing Diagnoses: Potential for falls Goals: Patient/caregiver will verbalize/demonstrate measures taken to prevent injury and/or falls Date Initiated: 07/21/2019 Target Resolution Date: 12/17/2019 Goal Status: Active Interventions: Assess fall risk on admission and as needed Assess impairment of mobility on admission and as needed per policy Notes: Electronic Signature(s) Signed: 12/02/2019 5:45:21 PM By: Deon Pilling Entered By: Deon Pilling on 12/02/2019 08:59:15 -------------------------------------------------------------------------------- Pain Assessment Details Patient Name: Date of Service: [name], [name] 12/02/2019 9:00 A M Medical Record Number: [id number] Patient Account Number: [id number] Date of Birth/Sex: Treating RN: Jul 13, 1940 (79 y.o. [name] Primary Care Chaysen Tillman: [name] Other Clinician: Referring Deretha Ertle: Treating Miguel Christiana/Extender: Gabriel Rainwater in Treatment: 19 Active Problems Location of Pain Severity and Description of Pain Patient Has Paino No Site Locations Pain Management and Medication Current Pain Management: Electronic Signature(s) Signed: 12/02/2019 4:29:25 PM By: Kela Millin Entered By: Kela Millin on 12/02/2019 08:59:10 -------------------------------------------------------------------------------- Patient/Caregiver Education Details Patient Name: Date of Service: [name], [name] 6/17/2021andnbsp9:00 A M Medical Record Number: [id number] Patient Account Number: [id number] Date of Birth/Gender: Treating RN: 05-08-1941 (79 y.o. [name] Primary Care Physician: [name] Other Clinician: Referring Physician: Treating Physician/Extender: Linton Ham  [name] Weeks in Treatment: 19 Education Assessment Education Provided To: Patient Education Topics Provided Wound/Skin Impairment: Handouts: Skin Care Do's and Dont's Methods: Explain/Verbal Responses: Reinforcements needed Electronic Signature(s) Signed: 12/02/2019 5:45:21 PM By: Deon Pilling Entered By: Deon Pilling on 12/02/2019 08:59:27 -------------------------------------------------------------------------------- Wound Assessment Details Patient Name: Date of Service: [name], [name] 12/02/2019 9:00 A M Medical Record Number: [id number] Patient Account Number: [id number] Date of Birth/Sex: Treating RN: 23-Nov-1940 (79 y.o. [name] Primary Care Desiree Fleming: [name] Other Clinician: Referring Lareina Espino: Treating Cassie Shedlock/Extender: Gabriel Rainwater in Treatment: 19 Wound Status Wound Number: 21 Primary Diabetic Wound/Ulcer of the Lower Extremity Etiology: Wound Location: Left, Plantar Foot Wound Open Wounding Event: Gradually Appeared Status: Date Acquired: 06/17/2018 Comorbid Cataracts, Glaucoma, Hypertension, Peripheral Arterial Disease, Weeks Of Treatment: 19 History: Type II Diabetes, Osteomyelitis, Neuropathy Clustered Wound: No Wound Measurements Length: (cm) 1.5 Width: (cm) 0.4 Depth: (cm) 0.7 Area: (cm) 0.471 Volume: (cm) 0.33 % Reduction in Area: 79.6% % Reduction in Volume: 71.4% Epithelialization: None Tunneling: No Undermining: Yes Starting Position (o'clock): 12 Ending Position (o'clock): 12 Maximum Distance: (cm) 0.4 Wound Description Classification: Grade 2 Wound Margin: Thickened Exudate Amount: Medium Exudate Type: Serosanguineous Exudate Color: red, brown Foul Odor After Cleansing: No Slough/Fibrino No Wound Bed Granulation Amount: Medium (34-66%) Exposed Structure Granulation Quality: Red, Pink Fascia Exposed: No Necrotic Amount: None Present (0%) Fat  Layer (Subcutaneous Tissue) Exposed: Yes Tendon Exposed: No Muscle Exposed: No Joint Exposed: No Bone Exposed: No Treatment Notes Wound #21 (Left, Plantar Foot) 1. Cleanse With Wound Cleanser 3. Primary Dressing Applied Calcium Alginate Ag 4. Secondary Dressing Dry Gauze Roll Gauze Foam 5. Secured With Medipore tape 7. Footwear/Offloading device applied Diabetic shoe Notes foam donut as secondary. netting. Electronic Signature(s) Signed: 12/02/2019 4:29:25 PM By: Kela Millin Entered By: Kela Millin on 12/02/2019 09:05:05 -------------------------------------------------------------------------------- Wound Assessment Details Patient Name: Date of Service: [name], [name] 12/02/2019 9:00 A M Medical Record Number: [id number] Patient Account Number: [id number] Date of Birth/Sex: Treating RN: [birth year] (79 y.o. [name] Primary Care Rosie Torrez: [name] Other Clinician: Referring Yosef Krogh: Treating Josselyne Onofrio/Extender: Gabriel Rainwater in Treatment: 19 Wound Status Wound Number: 22 Primary Diabetic Wound/Ulcer of the Lower Extremity Etiology: Wound Location: Right Metatarsal head first Wound Open Wounding Event: Gradually Appeared Status: Date Acquired: 06/17/2018 Comorbid Cataracts, Glaucoma, Hypertension, Peripheral Arterial Disease, Weeks Of Treatment: 19 History: Type II Diabetes, Osteomyelitis, Neuropathy Clustered Wound: No Wound Measurements Length: (cm) 0.8 Width: (cm) 1.2 Depth: (cm) 0.3 Area: (cm) 0.754 Volume: (cm) 0.226 % Reduction in Area: -20.1% % Reduction in Volume: -20.2% Epithelialization: None Tunneling: No Undermining: No Wound Description Classification: Grade 2 Wound Margin: Thickened Exudate Amount: Medium Exudate Type: Serosanguineous Exudate Color: red, brown Foul Odor After Cleansing: No Slough/Fibrino No Wound Bed Granulation Amount: Large (67-100%) Exposed  Structure Granulation Quality: Red, Pink Fascia Exposed: No Necrotic Amount: None Present (0%) Fat Layer (Subcutaneous Tissue) Exposed: Yes Tendon Exposed: No Muscle Exposed: No Joint Exposed: No Bone Exposed: No Treatment Notes Wound #22 (Right Metatarsal head first) 1. Cleanse With Wound Cleanser 3. Primary Dressing Applied Calcium Alginate Ag 4. Secondary Dressing Dry Gauze Roll Gauze Foam 5. Secured With Medipore tape 7. Footwear/Offloading device applied Diabetic shoe Notes foam donut as secondary. netting. Electronic Signature(s) Signed: 12/02/2019 4:29:25 PM By: Kela Millin Entered By: Kela Millin on 12/02/2019 09:06:18 -------------------------------------------------------------------------------- Wound Assessment Details Patient Name: Date of Service: [name], [name] 12/02/2019 9:00 A  M Medical Record Number: [id number] Patient Account Number: [id number] Date of Birth/Sex: Treating RN: September 09, 1940 (79 y.o. [name] Primary Care Garnie Borchardt: [name] Other Clinician: Referring Cybil Senegal: Treating Clifford Benninger/Extender: Gabriel Rainwater in Treatment: 19 Wound Status Wound Number: 23 Primary Diabetic Wound/Ulcer of the Lower Extremity Etiology: Wound Location: Left, Proximal, Plantar Foot Wound Open Wounding Event: Gradually Appeared Status: Date Acquired: 11/11/2019 Comorbid Cataracts, Glaucoma, Hypertension, Peripheral Arterial Disease, Weeks Of Treatment: 3 History: Type II Diabetes, Osteomyelitis, Neuropathy Clustered Wound: No Wound Measurements Length: (cm) Width: (cm) Depth: (cm) Area: (cm) Volume: (cm) 0 % Reduction in Area: 100% 0 % Reduction in Volume: 100% 0 Epithelialization: Large (67-100%) 0 Tunneling: No 0 Undermining: No Wound Description Classification: Grade 2 Wound Margin: Distinct, outline attached Exudate Amount: None Present Foul Odor After Cleansing:  No Slough/Fibrino No Wound Bed Granulation Amount: None Present (0%) Exposed Structure Necrotic Amount: None Present (0%) Fascia Exposed: No Fat Layer (Subcutaneous Tissue) Exposed: No Tendon Exposed: No Muscle Exposed: No Joint Exposed: No Bone Exposed: No Electronic Signature(s) Signed: 12/02/2019 4:29:25 PM By: Kela Millin Entered By: Kela Millin on 12/02/2019 09:06:45 -------------------------------------------------------------------------------- Vitals Details Patient Name: Date of Service: [name], [name] 12/02/2019 9:00 A M Medical Record Number: [id number] Patient Account Number: [id number] Date of Birth/Sex: Treating RN: Apr 07, 1941 (79 y.o. [name] Primary Care Kaniya Trueheart: [name] Other Clinician: Referring Eugenio Dollins: Treating Janean Eischen/Extender: Gabriel Rainwater in Treatment: 19 Vital Signs Time Taken: 08:55 Temperature (F): 98 Height (in): 73 Pulse (bpm): 75 Weight (lbs): 180 Respiratory Rate (breaths/min): 19 Body Mass Index (BMI): 23.7 Blood Pressure (mmHg): 115/73 Reference Range: 80 - 120 mg / dl Electronic Signature(s) Signed: 12/02/2019 4:29:25 PM By: Kela Millin Entered By: Kela Millin on 12/02/2019 08:59:03

## 2019-12-03 NOTE — Progress Notes (Signed)
Casey, [name] ([id number]) Visit Report for 12/02/2019 Debridement Details Patient Name: Date of Service: [name], [place] [name] 12/02/2019 9:00 A M Medical Record Number: [id number] Patient Account Number: [id number] Date of Birth/Sex: Treating RN: 04-Nov-1940 (79 y.o. [name] Primary Care Provider: [name] Other Clinician: Referring Provider: Treating Provider/Extender: Gabriel Rainwater in Treatment: 19 Debridement Performed for Assessment: Wound #21 [place] Performed By: Physician Ricard Dillon., MD Debridement Type: Debridement Severity of Tissue Pre Debridement: Fat layer exposed Level of Consciousness (Pre-procedure): Awake and Alert Pre-procedure Verification/Time Out Yes - 09:16 Taken: Start Time: 09:17 Pain Control: Lidocaine 4% T opical Solution T Area Debrided (L x W): otal 0.5 (cm) x 0.5 (cm) = 0.25 (cm) Tissue and other material debrided: Non-Viable, Callus, Eschar, Skin: Dermis Level: Skin/Dermis Debridement Description: Selective/Open Wound Instrument: Curette Bleeding: Minimum Hemostasis Achieved: Pressure End Time: 09:19 Procedural Pain: 0 Post Procedural Pain: 0 Response to Treatment: Procedure was tolerated well Level of Consciousness (Post- Awake and Alert procedure): Post Debridement Measurements of Total Wound Length: (cm) 1.5 Width: (cm) 0.4 Depth: (cm) 0.7 Volume: (cm) 0.33 Character of Wound/Ulcer Post Debridement: Improved Severity of Tissue Post Debridement: Fat layer exposed Post Procedure Diagnosis Same as Pre-procedure Electronic Signature(s) Signed: 12/02/2019 5:45:21 PM By: Deon Pilling Signed: 12/03/2019 7:45:08 AM By: Linton Ham MD Entered By: Linton Ham on 12/02/2019 09:22:22 -------------------------------------------------------------------------------- HPI Details Patient Name: Date of Service: [name], [name] 12/02/2019 9:00 A M Medical Record Number:  [id number] Patient Account Number: [id number] Date of Birth/Sex: Treating RN: Dec 09, 1940 (79 y.o. [name] Primary Care Provider: [name] Other Clinician: Referring Provider: Treating Provider/Extender: Gabriel Rainwater in Treatment: 19 History of Present Illness HPI Description: very pleasant 79 year old gentleman has type 2 diabetes and has been having an ulcer on his foot for several years. Been seeing as this time around since the end of October. Classified as a Wagner grade 2 because his previous x-rays did not show any problems. He has an MRI pending this Friday. discussing with him he says he has not been able to get his diabetic shoes yet. He continues to smoke his pipe and drinks moonshine and says he will not give this up. I have tried to counsel him regarding this but he firmly says that he is happy to listen to me but he is not going to give up his habits. 08/17/14 -- patient has no fresh complaints but has recently come with his MRI which was done on 08/12/2014. 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 Readmission: 07/21/2019 upon evaluation today patient presents for initial inspection here in our clinic concerning issues that he has been having with his bilateral feet. He has open wounds that been present at least since the beginning of the year although he does not know an exact time. He is previously had amputations of all toes and the distal portion of his foot. 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 With that being said he does have a history of diabetes, peripheral vascular disease, nicotine dependence, and hypertension. He has not had any recent arterial or vascular studies he was noncompressible today I think he is going to require referral for arterial studies as well. 2/11; patient was readmitted  to our clinic last week. He has bilateral plantar foot wounds in the setting of previous transmetatarsal amputations. He had his  arterial studies done on 2/8; these were actually quite good. He was noncompressible on both sides however his waveforms were triphasic. The thought was noncompressible vessels consistent with medial calcification but without significant stenosis in the lower extremities. X-rays were done on the left this showed extensive chronic and postoperative changes without evidence of osteomyelitis. There was no evidence of osteomyelitis on the right foot there was mild vascular calcification MRI was suggested on the left based on clinical correlation. The patient arrives in clinic with some odor and drainage from the left foot. Right foot measures smaller. He is really not offloading these areas although he says he does not walk much 2/25; the patient arrives with some odor and drainage from the left foot again. Post debridement I cultured this area. He has the area on the midfoot roughly the third metatarsal head and the first metatarsal head on the right. He is in surgical shoes bilateral transmet 3/4; culture from last week showed abundant group B strep and a few Proteus. We will put him on cephalexin starting today. Still silver alginate to the wounds 3/11; due to issues with pharmacy he still does not have the cephalexin that I prescribed a week ago. Apparently this was called into a different pharmacy on Friday but they have not delivered it to him. We have been using silver alginate on the wounds but because of lack of moisture change to silver collagen today. We are offloading as best we can in surgical shoes 3/18; he is taking the cephalexin that I prescribed 2 weeks ago. Using silver collagen. He has surgical shoes. Setting of bilateral previous transmetatarsal amputations. Wounds are mirror-image wounds medially. 3/25; mirror-image wounds in the setting of previous  transmetatarsal amputation. The wounds are mirror image medial foot wounds. We have been using silver collagen. We are using surgical shoes offloaded with felt. He came in today asking about his "$900" custom-made shoes although after talking to them it does not appear that he ever really wore these and they are more than a year old 4/8; mirror-image wounds on the medial part of the plantar feet in the setting of previous transmetatarsal amputations. We have been using silver collagen. I aggressively debrided these 2 weeks ago removing callus subcutaneous tissue from around the wound margins. He did indeed bring echo he has custom-made shoes with transmetatarsal inserts. He has never worn these. He claims to be not spending a lot of time on his feet 4/15; mirror-image wounds on the plantar feet in the setting of previous transmetatarsal amputation. We have been using silver alginate. Culture that I did last time grew MRSA and Proteus. I have him on doxycycline. This was a deep tissue culture. The Proteus was not actively plated against doxycycline but was otherwise pansensitive I think the doxycycline would cover this. His wounds actually looks somewhat better. Rolled senescent edges still around the wound edges 4/29; mirror-image wounds on the plantar feet in the setting of previous bilateral transmetatarsal amputations we have been using silver alginate. Last time I saw him 2 weeks ago I gave him a course of doxycycline. Things seem to have cleaned up. This is in response to a deep tissue culture that showed MRSA and Proteus 5/6; wounds on the bilateral plantar feet in the setting of previous transmetatarsal amputations. We have been using silver alginate. Arrives in the clinic today with the same raised thick callus and subcutaneous tissue. He has home health changing the dressing. He brought up the fact that he has  transportation through [facility] apparently he only has "12 visits".  Otherwise he has the pay $40 for a cab ride there and back from his home which I guess is [place] [address]. 11/11/19-Patient back with left transmet plantar surface much worse with extension of devitalized tissue proximally from the original wound with dense callus rim, the right is about the same, apparently patient was wearing tennis shoes at home per home health, although patient swears that he has been using his offloading devices that he has with inserts. 6/8; I have not seen this patient in over a month however he was seen last week in my absence. He has mirror-image wounds over his bilateral transmit plantar surfaces. He has been using silver alginate. He comes in today with literally a mountain of callus around the 2 wounds on the left. The area on the right somewhat better but still callus and nonviable tissue around the circular wound with some depth. He had an x-ray done last week of the left foot that was negative a culture showed multiple organisms. He did not get any antibiotics. He has custom-made shoes and apparently is getting another pair from [employer] that is in the process. I told him that we are not offloading the sufficiently for what ever reason. I do not think he is wearing his custom shoes at home although he says he is not walking that much 6/17 bilateral plantar foot in the setting of previous transmetatarsal amputations. He has been using silver alginate. He has his own custom made shoes which he is supposed to have it replaced in the next week or 2. We have been extensively debriding the callus around both wound sites and things look slightly better today Electronic Signature(s) Signed: 12/03/2019 7:45:08 AM By: Linton Ham MD Entered By: Linton Ham on 12/02/2019 09:23:36 -------------------------------------------------------------------------------- Physical Exam Details Patient Name: Date of Service: Casey [name] [name], [name] 12/02/2019 9:00 A M Medical Record  Number: [id number] Patient Account Number: [id number] Date of Birth/Sex: Treating RN: 22-Jan-1941 (79 y.o. [name] Primary Care Provider: [name] Other Clinician: Referring Provider: Treating Provider/Extender: Gabriel Rainwater in Treatment: 19 Constitutional Sitting or standing Blood Pressure is within target range for patient.. Pulse regular and within target range for patient.Marland Kitchen Respirations regular, non-labored and within target range.. Temperature is normal and within the target range for the patient.Marland Kitchen Appears in no distress. Notes Wound exam Left foot better than last time I saw this a week ago. Still requiring debridement of callus skin and eschar from around the wound margins. He has a narrow wound but still with some depth. On the right circular wound with some depth. Again much less callus than I am used to seeing in this area No evidence of infection Electronic Signature(s) Signed: 12/03/2019 7:45:08 AM By: Linton Ham MD Entered By: Linton Ham on 12/02/2019 09:24:59 -------------------------------------------------------------------------------- Physician Orders Details Patient Name: Date of Service: [name], [name] 12/02/2019 9:00 A M Medical Record Number: [id number] Patient Account Number: [id number] Date of Birth/Sex: Treating RN: [birth year] (79 y.o. [name], Meta.Reding Primary Care Provider: [name] Other Clinician: Referring Provider: Treating Provider/Extender: Gabriel Rainwater in Treatment: 61 Verbal / Phone Orders: No Diagnosis Coding ICD-10 Coding Code Description E11.621 Type 2 diabetes mellitus with foot ulcer L97.522 Non-pressure chronic ulcer of other part of left foot with fat layer exposed L97.512 Non-pressure chronic ulcer of other part of right foot with fat layer exposed I73.89 Other specified  peripheral vascular diseases F17.218 Nicotine dependence,  cigarettes, with other nicotine-induced disorders I10 Essential (primary) hypertension L84 Corns and callosities Follow-up Appointments ppointment in 1 week. - Thursday afternoon for a total contact cast right foot Return A Dressing Change Frequency Change dressing three times week. Wound Cleansing Clean wound with Wound Cleanser May shower with protection. Primary Wound Dressing Wound #21 Left,Plantar Foot lginate with Silver - ensure to pack into wound bed and any undermining. Calcium A Wound #22 Right Metatarsal head first lginate with Silver - ensure to pack into wound bed and any undermining. Calcium A Secondary Dressing Wound #21 Left,Plantar Foot Foam - foam donut to offload Kerlix/Rolled Gauze Dry Gauze Wound #22 Right Metatarsal head first Foam - foam donut to offload Kerlix/Rolled Gauze Dry Gauze Edema Control Avoid standing for long periods of time Elevate legs to the level of the heart or above for 30 minutes daily and/or when sitting, a frequency of: - throughout the day. Off-Loading Other: - felt patient's personal shoes. patient to wear diabetic shoes. patient to limit about of time spent walking and standing. [place] [facility] for wound care. - [employer], maintenance) Signed: 12/02/2019 5:45:21 PM By: Deon Pilling Signed: 12/03/2019 7:45:08 AM By: Linton Ham MD Entered By: Deon Pilling on 12/02/2019 09:19:31 -------------------------------------------------------------------------------- Problem List Details Patient Name: Date of Service: Casey [name] [name], [name] 12/02/2019 9:00 A M Medical Record Number: [id number] Patient Account Number: [id number] Date of Birth/Sex: Treating RN: [birth year] (79 y.o. [name], Meta.Reding Primary Care Provider: [name] Other Clinician: Referring Provider: Treating Provider/Extender: Gabriel Rainwater in Treatment: 19 Active  Problems ICD-10 Encounter Code Description Active Date MDM Diagnosis E11.621 Type 2 diabetes mellitus with foot ulcer 07/21/2019 No Yes L97.522 Non-pressure chronic ulcer of other part of left foot with fat layer exposed 07/21/2019 No Yes L97.512 Non-pressure chronic ulcer of other part of right foot with fat layer exposed 07/21/2019 No Yes I73.89 Other specified peripheral vascular diseases 07/21/2019 No Yes F17.218 Nicotine dependence, cigarettes, with other nicotine-induced disorders 07/21/2019 No Yes I10 Essential (primary) hypertension 07/21/2019 No Yes L84 Corns and callosities 07/21/2019 No Yes Inactive Problems Resolved Problems Electronic Signature(s) Signed: 12/03/2019 7:45:08 AM By: Linton Ham MD Entered By: Linton Ham on 12/02/2019 09:21:49 -------------------------------------------------------------------------------- Progress Note Details Patient Name: Date of Service: Casey [name] [name], [name] 12/02/2019 9:00 A M Medical Record Number: [id number] Patient Account Number: [id number] Date of Birth/Sex: Treating RN: [birth year] (79 y.o. [name] Primary Care Provider: [name] Other Clinician: Referring Provider: Treating Provider/Extender: Gabriel Rainwater in Treatment: 19 Subjective History of Present Illness (HPI) very pleasant 79 year old gentleman has type 2 diabetes and has been having an ulcer on his foot for several years. Been seeing as this time around since the end of October. Classified as a Wagner grade 2 because his previous x-rays did not show any problems. He has an MRI pending this Friday. discussing with him he says he has not been able to get his diabetic shoes yet. He continues to smoke his pipe and drinks moonshine and says he will not give this up. I have tried to counsel him regarding this but he firmly says that he is happy to listen to me but he is not going to give up his habits. 08/17/14 -- patient has no fresh  complaints but has recently come with his MRI which was done on 08/12/2014. The MRI shows septic arthritis of the third MTP joint  with osteomyelitis of both the distal metatarsal and the proximal phalanx of the third toe. Readmission: 07/21/2019 upon evaluation today patient presents for initial inspection here in our clinic concerning issues that he has been having with his bilateral feet. He has open wounds that been present at least since the beginning of the year although he does not know an exact time. He is previously had amputations of all toes and the distal portion of his foot. 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 With that being said he does have a history of diabetes, peripheral vascular disease, nicotine dependence, and hypertension. He has not had any recent arterial or vascular studies he was noncompressible today I think he is going to require referral for arterial studies as well. 2/11; patient was readmitted to our clinic last week. He has bilateral plantar foot wounds in the setting of previous transmetatarsal amputations. He had his arterial studies done on 2/8; these were actually quite good. He was noncompressible on both sides however his waveforms were triphasic. The thought was noncompressible vessels consistent with medial calcification but without significant stenosis in the lower extremities. X-rays were done on the left this showed extensive chronic and postoperative changes without evidence of osteomyelitis. There was no evidence of osteomyelitis on the right foot there was mild vascular calcification MRI was suggested on the left based on clinical correlation. The patient arrives in clinic with some odor and drainage from the left foot. Right foot measures smaller. He is really not offloading these areas although he says he does not walk much 2/25; the patient arrives with some odor and drainage from the left foot again. Post debridement I  cultured this area. He has the area on the midfoot roughly the third metatarsal head and the first metatarsal head on the right. He is in surgical shoes bilateral transmet 3/4; culture from last week showed abundant group B strep and a few Proteus. We will put him on cephalexin starting today. Still silver alginate to the wounds 3/11; due to issues with pharmacy he still does not have the cephalexin that I prescribed a week ago. Apparently this was called into a different pharmacy on Friday but they have not delivered it to him. We have been using silver alginate on the wounds but because of lack of moisture change to silver collagen today. We are offloading as best we can in surgical shoes 3/18; he is taking the cephalexin that I prescribed 2 weeks ago. Using silver collagen. He has surgical shoes. Setting of bilateral previous transmetatarsal amputations. Wounds are mirror-image wounds medially. 3/25; mirror-image wounds in the setting of previous transmetatarsal amputation. The wounds are mirror image medial foot wounds. We have been using silver collagen. We are using surgical shoes offloaded with felt. He came in today asking about his "$900" custom-made shoes although after talking to them it does not appear that he ever really wore these and they are more than a year old 4/8; mirror-image wounds on the medial part of the plantar feet in the setting of previous transmetatarsal amputations. We have been using silver collagen. I aggressively debrided these 2 weeks ago removing callus subcutaneous tissue from around the wound margins. He did indeed bring echo he has custom-made shoes with transmetatarsal inserts. He has never worn these. He claims to be not spending a lot of time on his feet 4/15; mirror-image wounds on the plantar feet in the setting of previous transmetatarsal amputation. We have been using silver alginate. Culture that I did last  time grew MRSA and Proteus. I have him on  doxycycline. This was a deep tissue culture. The Proteus was not actively plated against doxycycline but was otherwise pansensitive I think the doxycycline would cover this. His wounds actually looks somewhat better. Rolled senescent edges still around the wound edges 4/29; mirror-image wounds on the plantar feet in the setting of previous bilateral transmetatarsal amputations we have been using silver alginate. Last time I saw him 2 weeks ago I gave him a course of doxycycline. Things seem to have cleaned up. This is in response to a deep tissue culture that showed MRSA and Proteus 5/6; wounds on the bilateral plantar feet in the setting of previous transmetatarsal amputations. We have been using silver alginate. Arrives in the clinic today with the same raised thick callus and subcutaneous tissue. He has home health changing the dressing. He brought up the fact that he has transportation through [facility] apparently he only has "12 visits". Otherwise he has the pay $40 for a cab ride there and back from his home which I guess is somewhere [place] [address]. 11/11/19-Patient back with left transmet plantar surface much worse with extension of devitalized tissue proximally from the original wound with dense callus rim, the right is about the same, apparently patient was wearing tennis shoes at home per home health, although patient swears that he has been using his offloading devices that he has with inserts. 6/8; I have not seen this patient in over a month however he was seen last week in my absence. He has mirror-image wounds over his bilateral transmit plantar surfaces. He has been using silver alginate. He comes in today with literally a mountain of callus around the 2 wounds on the left. The area on the right somewhat better but still callus and nonviable tissue around the circular wound with some depth. He had an x-ray done last week of the left foot that was negative a culture showed multiple  organisms. He did not get any antibiotics. He has custom-made shoes and apparently is getting another pair from [employer] that is in the process. I told him that we are not offloading the sufficiently for what ever reason. I do not think he is wearing his custom shoes at home although he says he is not walking that much 6/17 bilateral plantar foot in the setting of previous transmetatarsal amputations. He has been using silver alginate. He has his own custom made shoes which he is supposed to have it replaced in the next week or 2. We have been extensively debriding the callus around both wound sites and things look slightly better today Objective Constitutional Sitting or standing Blood Pressure is within target range for patient.. Pulse regular and within target range for patient.Marland Kitchen Respirations regular, non-labored and within target range.. Temperature is normal and within the target range for the patient.Marland Kitchen Appears in no distress. Vitals Time Taken: 8:55 AM, Height: 73 in, Weight: 180 lbs, BMI: 23.7, Temperature: 98 F, Pulse: 75 bpm, Respiratory Rate: 19 breaths/min, Blood Pressure: 115/73 mmHg. General Notes: Wound exam ooLeft foot better than last time I saw this a week ago. Still requiring debridement of callus skin and eschar from around the wound margins. He has a narrow wound but still with some depth. ooOn the right circular wound with some depth. Again much less callus than I am used to seeing in this area ooNo evidence of infection Integumentary (Hair, Skin) Wound #21 status is Open. Original cause of wound was Gradually Appeared.  The wound is located on the [place]. The wound measures 1.5cm length x 0.4cm width x 0.7cm depth; 0.471cm^2 area and 0.33cm^3 volume. There is Fat Layer (Subcutaneous Tissue) Exposed exposed. There is no tunneling noted, however, there is undermining starting at 12:00 and ending at 12:00 with a maximum distance of 0.4cm. There is a medium amount of  serosanguineous drainage noted. The wound margin is thickened. There is medium (34-66%) red, pink granulation within the wound bed. There is no necrotic tissue within the wound bed. Wound #22 status is Open. Original cause of wound was Gradually Appeared. The wound is located on the Right Metatarsal head first. The wound measures 0.8cm length x 1.2cm width x 0.3cm depth; 0.754cm^2 area and 0.226cm^3 volume. There is Fat Layer (Subcutaneous Tissue) Exposed exposed. There is no tunneling or undermining noted. There is a medium amount of serosanguineous drainage noted. The wound margin is thickened. There is large (67-100%) red, pink granulation within the wound bed. There is no necrotic tissue within the wound bed. Wound #23 status is Open. Original cause of wound was Gradually Appeared. The wound is located on the Left,Proximal,Plantar Foot. The wound measures 0cm length x 0cm width x 0cm depth; 0cm^2 area and 0cm^3 volume. There is no tunneling or undermining noted. There is a none present amount of drainage noted. The wound margin is distinct with the outline attached to the wound base. There is no granulation within the wound bed. There is no necrotic tissue within the wound bed. 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 There was a Selective/Open Wound Skin/Dermis Debridement with a total area of 0.25 sq cm performed by Ricard Dillon.,  MD. With the following instrument(s): Curette to remove Non-Viable tissue/material. Material removed includes Eschar, Callus, and Skin: Dermis after achieving pain control using Lidocaine 4% T opical Solution. A time out was conducted at 09:16, prior to the start of the procedure. A Minimum amount of bleeding was controlled with Pressure. The procedure was tolerated well with a pain level of 0 throughout and a pain level of 0 following the procedure. Post Debridement Measurements: 1.5cm length x 0.4cm width x 0.7cm depth; 0.33cm^3 volume. Character of Wound/Ulcer Post Debridement is improved. Severity of Tissue Post Debridement is: Fat layer exposed. Post procedure Diagnosis Wound #21: Same as Pre-Procedure Plan Follow-up Appointments: Return Appointment in 1 week. - Thursday afternoon for a total contact cast right foot Dressing Change Frequency: Change dressing three times week. Wound Cleansing: Clean wound with Wound Cleanser May shower with protection. Primary Wound Dressing: Wound #21 Left,Plantar Foot: Calcium Alginate with Silver - ensure to pack into wound bed and any undermining. Wound #22 Right Metatarsal head first: Calcium Alginate with Silver - ensure to pack into wound bed and any undermining. Secondary Dressing: Wound #21 Left,Plantar Foot: Foam - foam donut to offload Kerlix/Rolled Gauze Dry Gauze Wound #22 Right Metatarsal head first: Foam - foam donut to offload Kerlix/Rolled Gauze Dry Gauze Edema Control: Avoid standing for long periods of time Elevate legs to the level of the heart  or above for 30 minutes daily and/or when sitting, a frequency of: - throughout the day. Off-Loading: Other: - felt patient's personal shoes. patient to wear diabetic shoes. patient to limit about of time spent walking and standing. Home Health: [facility] for [facility]. - [facility]. I continued with silver alginate-based dressings. He has home health 2. I  talked to him about a total contact cast starting on the right foot and will look forward to applying this next week Electronic Signature(s) Signed: 12/03/2019 7:45:08 AM By: Linton Ham MD Entered By: Linton Ham on 12/02/2019 09:25:49 -------------------------------------------------------------------------------- SuperBill Details Patient Name: Date of Service: Casey [name] [name], [name] 12/02/2019 Medical Record Number: [id number] Patient Account Number: [id number] Date of Birth/Sex: Treating RN: Aug 09, 1940 (79 y.o. [name], Meta.Reding Primary Care Provider: [name] Other Clinician: Referring Provider: Treating Provider/Extender: Gabriel Rainwater in Treatment: 19 Diagnosis Coding ICD-10 Codes Code Description E11.621 Type 2 diabetes mellitus with foot ulcer L97.522 Non-pressure chronic ulcer of other part of left foot with fat layer exposed L97.512 Non-pressure chronic ulcer of other part of right foot with fat layer exposed I73.89 Other specified peripheral vascular diseases F17.218 Nicotine dependence, cigarettes, with other nicotine-induced disorders I10 Essential (primary) hypertension L84 Corns and callosities Facility Procedures The patient participates with Medicare or their insurance follows the Medicare Facility Guidelines: CPT4 Code Description Modifier Quantity [id number] - DEBRIDE WOUND 1ST 20 SQ CM OR < 1 ICD-10 Diagnosis Description L97.522 Non-pressure chronic ulcer  of other part of left foot with fat layer exposed Physician Procedures : CPT4 Code Description Modifier [id number] - WC PHYS DEBR WO ANESTH 20 SQ CM ICD-10 Diagnosis Description L97.522 Non-pressure chronic ulcer of other part of left foot with fat layer exposed Quantity: 1 Electronic Signature(s) Signed: 12/03/2019 7:45:08 AM By: Linton Ham MD Entered By: Linton Ham on 12/02/2019 09:26:03

## 2019-12-07 ENCOUNTER — Telehealth: Payer: Self-pay | Admitting: Family

## 2019-12-07 MED ORDER — PREGABALIN 50 MG PO CAPS: 50.0000 mg | ORAL_CAPSULE | Freq: Three times a day (TID) | ORAL | 2 refills | Status: DC

## 2019-12-07 NOTE — Telephone Encounter (Signed)
Multiple conversations with this pt about pain control. Should he come in for appt or should he be referred for pain management

## 2019-12-07 NOTE — Telephone Encounter (Signed)
Patient called requesting pain medication for feet and leg pains. Please send to pharmacy on file. Patient phone number is [phone]

## 2019-12-09 ENCOUNTER — Other Ambulatory Visit (HOSPITAL_COMMUNITY)
Admission: RE | Admit: 2019-12-09 | Discharge: 2019-12-09 | Disposition: A | Discharge status: Home / Self Care | Payer: Medicare Other | Source: Other Acute Inpatient Hospital | Attending: Internal Medicine | Admitting: Internal Medicine

## 2019-12-09 ENCOUNTER — Encounter (HOSPITAL_BASED_OUTPATIENT_CLINIC_OR_DEPARTMENT_OTHER): Payer: Medicare Other | Admitting: Internal Medicine

## 2019-12-09 ENCOUNTER — Other Ambulatory Visit: Payer: Self-pay

## 2019-12-09 DIAGNOSIS — E11621 Type 2 diabetes mellitus with foot ulcer: Secondary | ICD-10-CM | POA: Insufficient documentation

## 2019-12-10 NOTE — Progress Notes (Signed)
[name], [name] ([id number]) Visit Report for 12/09/2019 Arrival Information Details Patient Name: Date of Service: [name], [place] [name] 12/09/2019 1:00 PM Medical Record Number: [id number] Patient Account Number: [id number] Date of Birth/Sex: Treating RN: 10-Aug-1940 (79 y.o. [name] Primary Care Angeliah Wisdom: [name] Other Clinician: Referring Deivi Huckins: Treating Walaa Carel/Extender: Gabriel Rainwater in Treatment: 20 Visit Information History Since Last Visit Added or deleted any medications: No Patient Arrived: [name] Any new allergies or adverse reactions: No Arrival Time: 13:33 Had a fall or experienced change in No Accompanied By: alone activities of daily living that may affect Transfer Assistance: None risk of falls: Patient Identification Verified: Yes Signs or symptoms of abuse/neglect since last visito No Secondary Verification Process Completed: Yes Hospitalized since last visit: No Patient Requires Transmission-Based Precautions: No Implantable device outside of the clinic excluding No Patient Has Alerts: Yes cellular tissue based products placed in the center Patient Alerts: R ABI non compressible since last visit: L ABI non compressible Has Dressing in Place as Prescribed: Yes Pain Present Now: No Electronic Signature(s) Signed: 12/10/2019 5:45:26 PM By: Levan Hurst RN, BSN Entered By: Levan Hurst on 12/09/2019 13:33:53 -------------------------------------------------------------------------------- Encounter Discharge Information Details Patient Name: Date of Service: [name], [name] 12/09/2019 1:00 PM Medical Record Number: [id number] Patient Account Number: [id number] Date of Birth/Sex: Treating RN: [birth year] (79 y.o. [name] Primary Care Render Marley: [name] Other Clinician: Referring Vint Pola: Treating Quenna Doepke/Extender: Gabriel Rainwater in Treatment:  20 Encounter Discharge Information Items Post Procedure Vitals Discharge Condition: Stable Temperature (F): 98.5 Ambulatory Status: Cane Pulse (bpm): 61 Discharge Destination: Home Respiratory Rate (breaths/min): 18 Transportation: Other Blood Pressure (mmHg): 137/85 Accompanied By: self Schedule Follow-up Appointment: Yes Clinical Summary of Care: Patient Declined Notes transportation Electronic Signature(s) Signed: 12/09/2019 5:20:52 PM By: Baruch Gouty RN, BSN Entered By: Baruch Gouty on 12/09/2019 14:31:42 -------------------------------------------------------------------------------- Lower Extremity Assessment Details Patient Name: Date of Service: [name], [name] 12/09/2019 1:00 PM Medical Record Number: [id number] Patient Account Number: [id number] Date of Birth/Sex: Treating RN: 04-13-1941 (79 y.o. [name] Primary Care Kenlynn Houde: [name] Other Clinician: Referring Adalina Dopson: Treating Bernedette Auston/Extender: Gabriel Rainwater in Treatment: 20 Edema Assessment Assessed: [name]: No] Patrice Paradise: No] Edema: [Left: Yes] [Right: Yes] Calf Left: Right: Point of Measurement: 32 cm From Medial Instep 31 cm 36 cm Ankle Left: Right: Point of Measurement: 10 cm From Medial Instep 23.5 cm 26.5 cm Vascular Assessment Pulses: Dorsalis Pedis Palpable: [Left:Yes] [Right:Yes] Electronic Signature(s) Signed: 12/10/2019 5:45:26 PM By: Levan Hurst RN, BSN Entered By: Levan Hurst on 12/09/2019 13:44:32 -------------------------------------------------------------------------------- Multi Wound Chart Details Patient Name: Date of Service: [name], [name] 12/09/2019 1:00 PM Medical Record Number: [id number] Patient Account Number: [id number] Date of Birth/Sex: Treating RN: 1940-08-05 (79 y.o. [name], Meta.Reding Primary Care Baylee Mccorkel: [name] Other Clinician: Referring Breia Ocampo: Treating Kayda Allers/Extender:  Gabriel Rainwater in Treatment: 20 Vital Signs Height(in): 73 Capillary Blood Glucose(mg/dl): 125 Weight(lbs): 180 Pulse(bpm): 52 Body Mass Index(BMI): 24 Blood Pressure(mmHg): 137/85 Temperature(F): 98.5 Respiratory Rate(breaths/min): 18 Photos: [21:No Photos Left, Plantar Foot] [22:No Photos Right Metatarsal head first] [N/A:N/A N/A] Wound Location: [21:Gradually Appeared] [22:Gradually Appeared] [N/A:N/A] Wounding Event: [21:Diabetic Wound/Ulcer of the Lower] [22:Diabetic Wound/Ulcer of the Lower] [N/A:N/A] Primary Etiology: [21:Extremity Cataracts, Glaucoma, Hypertension,] [22:Extremity Cataracts, Glaucoma, Hypertension,] [N/A:N/A] Comorbid History: [21:Peripheral Arterial Disease, Type II Diabetes, Osteomyelitis, Neuropathy 06/17/2018] [22:Peripheral Arterial Disease, Type II Diabetes, Osteomyelitis, Neuropathy 06/17/2018] [N/A:N/A] Date Acquired: [21:20] [  22:20] [N/A:N/A] Weeks of Treatment: [21:Open] [22:Open] [N/A:N/A] Wound Status: [21:0.2x0.3x0.3] [22:0.9x1.4x0.6] [N/A:N/A] Measurements L x W x D (cm) [21:0.047] [22:0.99] [N/A:N/A] A (cm) : rea [21:0.014] [22:0.594] [N/A:N/A] Volume (cm) : [21:98.00%] [22:-57.60%] [N/A:N/A] % Reduction in A rea: [21:98.80%] [22:-216.00%] [N/A:N/A] % Reduction in Volume: [21:12] [22:12] Starting Position 1 (o'clock): [21:12] [22:12] Ending Position 1 (o'clock): [21:0.2] [22:0.5] Maximum Distance 1 (cm): [21:Yes] [22:Yes] [N/A:N/A] Undermining: [21:Grade 2] [22:Grade 2] [N/A:N/A] Classification: [21:Medium] [22:Medium] [N/A:N/A] Exudate A mount: [21:Serosanguineous] [22:Serosanguineous] [N/A:N/A] Exudate Type: [21:red, brown] [22:red, brown] [N/A:N/A] Exudate Color: [21:Thickened] [22:Thickened] [N/A:N/A] Wound Margin: [21:Large (67-100%)] [22:Large (67-100%)] [N/A:N/A] Granulation A mount: [21:Pink] [22:Pink] [N/A:N/A] Granulation Quality: [21:None Present (0%)] [22:None Present (0%)] [N/A:N/A] Necrotic A  mount: [21:Fat Layer (Subcutaneous Tissue)] [22:Fat Layer (Subcutaneous Tissue)] [N/A:N/A] Exposed Structures: [21:Exposed: Yes Fascia: No Tendon: No Muscle: No Joint: No Bone: No Small (1-33%)] [22:Exposed: Yes Fascia: No Tendon: No Muscle: No Joint: No Bone: No Small (1-33%)] [N/A:N/A] Epithelialization: [21:N/A] [22:Debridement - Excisional] [N/A:N/A] Debridement: Pre-procedure Verification/Time Out N/A [22:13:45] [N/A:N/A] Taken: [21:N/A] [22:Lidocaine 4% T opical Solution] [N/A:N/A] Pain Control: [21:N/A] [22:Callus, Subcutaneous, Slough] [N/A:N/A] Tissue Debrided: [21:N/A] [22:Skin/Subcutaneous Tissue] [N/A:N/A] Level: [21:N/A] [22:1.5] [N/A:N/A] Debridement A (sq cm): [21:rea N/A] [22:Curette, Forceps] [N/A:N/A] Instrument: [21:N/A] [22:Moderate] [N/A:N/A] Bleeding: [21:N/A] [22:Silver Nitrate] [N/A:N/A] Hemostasis A chieved: [21:N/A] [22:0] [N/A:N/A] Procedural Pain: [21:N/A] [22:0] [N/A:N/A] Post Procedural Pain: [21:N/A] [22:Procedure was tolerated well] [N/A:N/A] Debridement Treatment Response: [21:N/A] [22:0.9x1.4x0.6] [N/A:N/A] Post Debridement Measurements L x W x D (cm) [21:N/A] [22:0.594] [N/A:N/A] Post Debridement Volume: (cm) [21:N/A] [22:Debridement] [N/A:N/A] Treatment Notes Electronic Signature(s) Signed: 12/09/2019 5:34:37 PM By: Deon Pilling Signed: 12/10/2019 5:27:17 PM By: Linton Ham MD Entered By: Linton Ham on 12/09/2019 14:02:23 -------------------------------------------------------------------------------- Multi-Disciplinary Care Plan Details Patient Name: Date of Service: [name], [name] 12/09/2019 1:00 PM Medical Record Number: [id number] Patient Account Number: [id number] Date of Birth/Sex: Treating RN: 1940/10/18 (79 y.o. [name] Primary Care Ivory Bail: [name] Other Clinician: Referring Kasey Hansell: Treating Norville Dani/Extender: Gabriel Rainwater in Treatment: 20 Active Inactive Abuse /  Safety / Falls / Self Care Management Nursing Diagnoses: Potential for falls Goals: Patient/caregiver will verbalize/demonstrate measures taken to prevent injury and/or falls Date Initiated: 07/21/2019 Target Resolution Date: 12/17/2019 Goal Status: Active Interventions: Assess fall risk on admission and as needed Assess impairment of mobility on admission and as needed per policy Notes: Electronic Signature(s) Signed: 12/09/2019 5:34:37 PM By: Deon Pilling Entered By: Deon Pilling on 12/09/2019 13:37:33 -------------------------------------------------------------------------------- Pain Assessment Details Patient Name: Date of Service: Alveta Heimlich [name], [name] 12/09/2019 1:00 PM Medical Record Number: [id number] Patient Account Number: [id number] Date of Birth/Sex: Treating RN: 1940/12/06 (79 y.o. [name] Primary Care Braeden Kennan: [name] Other Clinician: Referring Somya Jauregui: Treating Jacinda Kanady/Extender: Gabriel Rainwater in Treatment: 20 Active Problems Location of Pain Severity and Description of Pain Patient Has Paino No Site Locations Pain Management and Medication Current Pain Management: Electronic Signature(s) Signed: 12/10/2019 5:45:26 PM By: Levan Hurst RN, BSN Entered By: Levan Hurst on 12/09/2019 13:34:55 -------------------------------------------------------------------------------- Patient/Caregiver Education Details Patient Name: Date of Service: [name], [name] 6/24/2021andnbsp1:00 PM Medical Record Number: [id number] Patient Account Number: [id number] Date of Birth/Gender: Treating RN: 1941-01-12 (79 y.o. [name] Primary Care Physician: [name] Other Clinician: Referring Physician: Treating Physician/Extender: Gabriel Rainwater in Treatment: 20 Education Assessment Education Provided To: Patient Education Topics Provided Wound/Skin  Impairment: Handouts: Skin Care Do's and Dont's Methods: Explain/Verbal Responses: Reinforcements needed Electronic Signature(s) Signed:  12/09/2019 5:34:37 PM By: Deon Pilling Entered By: Deon Pilling on 12/09/2019 13:37:43 -------------------------------------------------------------------------------- Wound Assessment Details Patient Name: Date of Service: Alveta Heimlich [name], [name] 12/09/2019 1:00 PM Medical Record Number: [id number] Patient Account Number: [id number] Date of Birth/Sex: Treating RN: 1941-03-24 (79 y.o. [name] Primary Care Zaylen Susman: [name] Other Clinician: Referring Erubiel Manasco: Treating Tyjai Charbonnet/Extender: Gabriel Rainwater in Treatment: 20 Wound Status Wound Number: 21 Primary Diabetic Wound/Ulcer of the Lower Extremity Etiology: Wound Location: Left, Plantar Foot Wound Open Wounding Event: Gradually Appeared Status: Date Acquired: 06/17/2018 Comorbid Cataracts, Glaucoma, Hypertension, Peripheral Arterial Disease, Weeks Of Treatment: 20 History: Type II Diabetes, Osteomyelitis, Neuropathy Clustered Wound: No Wound Measurements Length: (cm) 0.2 Width: (cm) 0.3 Depth: (cm) 0.3 Area: (cm) 0.047 Volume: (cm) 0.014 % Reduction in Area: 98% % Reduction in Volume: 98.8% Epithelialization: Small (1-33%) Tunneling: No Undermining: Yes Starting Position (o'clock): 12 Ending Position (o'clock): 12 Maximum Distance: (cm) 0.2 Wound Description Classification: Grade 2 Wound Margin: Thickened Exudate Amount: Medium Exudate Type: Serosanguineous Exudate Color: red, brown Foul Odor After Cleansing: No Slough/Fibrino No Wound Bed Granulation Amount: Large (67-100%) Exposed Structure Granulation Quality: Pink Fascia Exposed: No Necrotic Amount: None Present (0%) Fat Layer (Subcutaneous Tissue) Exposed: Yes Tendon Exposed: No Muscle Exposed: No Joint Exposed: No Bone Exposed: No Treatment Notes Wound #21 (Left,  Plantar Foot) 2. Periwound Care Moisturizing lotion 3. Primary Dressing Applied Calcium Alginate Ag 4. Secondary Dressing Dry Gauze Roll Gauze Foam 7. Footwear/Offloading device applied Diabetic shoe Electronic Signature(s) Signed: 12/10/2019 5:45:26 PM By: Levan Hurst RN, BSN Entered By: Levan Hurst on 12/09/2019 13:45:11 -------------------------------------------------------------------------------- Wound Assessment Details Patient Name: Date of Service: [name], [name] 12/09/2019 1:00 PM Medical Record Number: [id number] Patient Account Number: [id number] Date of Birth/Sex: Treating RN: [birth year] (79 y.o. [name] Primary Care Koleen Celia: [name] Other Clinician: Referring Ebenezer Mccaskey: Treating Claritza July/Extender: Gabriel Rainwater in Treatment: 20 Wound Status Wound Number: 22 Primary Diabetic Wound/Ulcer of the Lower Extremity Etiology: Wound Location: Right Metatarsal head first Wound Open Wounding Event: Gradually Appeared Status: Date Acquired: 06/17/2018 Comorbid Cataracts, Glaucoma, Hypertension, Peripheral Arterial Disease, Weeks Of Treatment: 20 History: Type II Diabetes, Osteomyelitis, Neuropathy Clustered Wound: No Wound Measurements Length: (cm) 0.9 Width: (cm) 1.4 Depth: (cm) 0.6 Area: (cm) 0.99 Volume: (cm) 0.594 % Reduction in Area: -57.6% % Reduction in Volume: -216% Epithelialization: Small (1-33%) Tunneling: No Undermining: Yes Starting Position (o'clock): 12 Ending Position (o'clock): 12 Maximum Distance: (cm) 0.5 Wound Description Classification: Grade 2 Wound Margin: Thickened Exudate Amount: Medium Exudate Type: Serosanguineous Exudate Color: red, brown Foul Odor After Cleansing: No Slough/Fibrino No Wound Bed Granulation Amount: Large (67-100%) Exposed Structure Granulation Quality: Pink Fascia Exposed: No Necrotic Amount: None Present (0%) Fat Layer (Subcutaneous Tissue)  Exposed: Yes Tendon Exposed: No Muscle Exposed: No Joint Exposed: No Bone Exposed: No Treatment Notes Wound #22 (Right Metatarsal head first) 2. Periwound Care Moisturizing lotion 3. Primary Dressing Applied Calcium Alginate Ag 4. Secondary Dressing Dry Gauze Roll Gauze Foam 7. Footwear/Offloading device applied Diabetic shoe Electronic Signature(s) Signed: 12/10/2019 5:45:26 PM By: Levan Hurst RN, BSN Entered By: Levan Hurst on 12/09/2019 13:45:47 -------------------------------------------------------------------------------- Vitals Details Patient Name: Date of Service: [name], [name] 12/09/2019 1:00 PM Medical Record Number: [id number] Patient Account Number: [id number] Date of Birth/Sex: Treating RN: 1941-02-06 (79 y.o. [name] Primary Care Dagmawi Venable: [name] Other Clinician: Referring Kaidin Boehle: Treating Demeshia Sherburne/Extender: Gabriel Rainwater in Treatment: 20 Vital Signs  Time Taken: 13:34 Temperature (F): 98.5 Height (in): 73 Pulse (bpm): 61 Weight (lbs): 180 Respiratory Rate (breaths/min): 18 Body Mass Index (BMI): 23.7 Blood Pressure (mmHg): 137/85 Capillary Blood Glucose (mg/dl): 125 Reference Range: 80 - 120 mg / dl Notes glucose per pt report Electronic Signature(s) Signed: 12/10/2019 5:45:26 PM By: Levan Hurst RN, BSN Entered By: Levan Hurst on 12/09/2019 13:34:26

## 2019-12-10 NOTE — Progress Notes (Signed)
[name] ([id number]) Visit Report for 12/09/2019 Debridement Details Patient Name: Date of Service: [name], [place] 12/09/2019 1:00 PM Medical Record Number: [id number] Patient Account Number: [id number] Date of Birth/Sex: Treating RN: [birth year] (79 y.o. [name] Primary Care Provider: Leeroy [name] Other Clinician: Referring Provider: Treating Provider/Extender: [name] in Treatment: 20 Debridement Performed for Assessment: Wound #22 Right Metatarsal head first Performed By: Physician [name]., MD Debridement Type: Debridement Severity of Tissue Pre Debridement: Fat layer exposed Level of Consciousness (Pre-procedure): Awake and Alert Pre-procedure Verification/Time Out Yes - 13:45 Taken: Start Time: 13:46 Pain Control: Lidocaine 4% T opical Solution T Area Debrided (L x W): otal 1 (cm) x 1.5 (cm) = 1.5 (cm) Tissue and other material debrided: Viable, Non-Viable, Callus, Slough, Subcutaneous, Skin: Dermis , Fibrin/Exudate, Slough Level: Skin/Subcutaneous Tissue Debridement Description: Excisional Instrument: Curette, Forceps Specimen: Tissue Culture Number of Specimens T aken: 1 Bleeding: Moderate Hemostasis Achieved: Silver Nitrate End Time: 13:55 Procedural Pain: 0 Post Procedural Pain: 0 Response to Treatment: Procedure was tolerated well Level of Consciousness (Post- Awake and Alert procedure): Post Debridement Measurements of Total Wound Length: (cm) 0.9 Width: (cm) 1.4 Depth: (cm) 0.6 Volume: (cm) 0.594 Character of Wound/Ulcer Post Debridement: Requires Further Debridement Severity of Tissue Post Debridement: Fat layer exposed Post Procedure Diagnosis Same as Pre-procedure Electronic Signature(s) Signed: 12/09/2019 5:34:37 PM By: Deon Pilling Signed: 12/10/2019 5:27:17 PM By: Linton Ham MD Entered By: Linton Ham on 12/09/2019  14:02:35 -------------------------------------------------------------------------------- HPI Details Patient Name: Date of Service: [name], [name] 12/09/2019 1:00 PM Medical Record Number: [id number] Patient Account Number: [id number] Date of Birth/Sex: Treating RN: 09-21-1940 (79 y.o. [name] Primary Care Provider: Leeroy [name] Other Clinician: Referring Provider: Treating Provider/Extender: [name] in Treatment: 20 History of Present Illness HPI Description: very pleasant 79 year old gentleman has type 2 diabetes and has been having an ulcer on his foot for several years. Been seeing as this time around since the end of October. Classified as a Wagner grade 2 because his previous x-rays did not show any problems. He has an MRI pending this Friday. discussing with him he says he has not been able to get his diabetic shoes yet. He continues to smoke his pipe and drinks moonshine and says he will not give this up. I have tried to counsel him regarding this but he firmly says that he is happy to listen to me but he is not going to give up his habits. 08/17/14 -- patient has no fresh complaints but has recently come with his MRI which was done on 08/12/2014. 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 Readmission: 07/21/2019 upon evaluation today patient presents for initial inspection here in our clinic concerning issues that he has been having with his bilateral feet. He has open wounds that been present at least since the beginning of the year although he does not know an exact time. He is previously had amputations of all toes and the distal portion of his foot. 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 With that being said he does have a history of diabetes, peripheral vascular disease, nicotine dependence, and  hypertension. He has not had any recent arterial or vascular studies he was noncompressible today I think he is going to require referral for arterial studies as well. 2/11; patient was readmitted to our clinic  last week. He has bilateral plantar foot wounds in the setting of previous transmetatarsal amputations. He had his arterial studies done on 2/8; these were actually quite good. He was noncompressible on both sides however his waveforms were triphasic. The thought was noncompressible vessels consistent with medial calcification but without significant stenosis in the lower extremities. X-rays were done on the left this showed extensive chronic and postoperative changes without evidence of osteomyelitis. There was no evidence of osteomyelitis on the right foot there was mild vascular calcification MRI was suggested on the left based on clinical correlation. The patient arrives in clinic with some odor and drainage from the left foot. Right foot measures smaller. He is really not offloading these areas although he says he does not walk much 2/25; the patient arrives with some odor and drainage from the left foot again. Post debridement I cultured this area. He has the area on the midfoot roughly the third metatarsal head and the first metatarsal head on the right. He is in surgical shoes bilateral transmet 3/4; culture from last week showed abundant group B strep and a few Proteus. We will put him on cephalexin starting today. Still silver alginate to the wounds 3/11; due to issues with pharmacy he still does not have the cephalexin that I prescribed a week ago. Apparently this was called into a different pharmacy on Friday but they have not delivered it to him. We have been using silver alginate on the wounds but because of lack of moisture change to silver collagen today. We are offloading as best we can in surgical shoes 3/18; he is taking the cephalexin that I prescribed 2 weeks ago. Using  silver collagen. He has surgical shoes. Setting of bilateral previous transmetatarsal amputations. Wounds are mirror-image wounds medially. 3/25; mirror-image wounds in the setting of previous transmetatarsal amputation. The wounds are mirror image medial foot wounds. We have been using silver collagen. We are using surgical shoes offloaded with felt. He came in today asking about his "$900" custom-made shoes although after talking to them it does not appear that he ever really wore these and they are more than a year old 4/8; mirror-image wounds on the medial part of the plantar feet in the setting of previous transmetatarsal amputations. We have been using silver collagen. I aggressively debrided these 2 weeks ago removing callus subcutaneous tissue from around the wound margins. He did indeed bring echo he has custom-made shoes with transmetatarsal inserts. He has never worn these. He claims to be not spending a lot of time on his feet 4/15; mirror-image wounds on the plantar feet in the setting of previous transmetatarsal amputation. We have been using silver alginate. Culture that I did last time grew MRSA and Proteus. I have him on doxycycline. This was a deep tissue culture. The Proteus was not actively plated against doxycycline but was otherwise pansensitive I think the doxycycline would cover this. His wounds actually looks somewhat better. Rolled senescent edges still around the wound edges 4/29; mirror-image wounds on the plantar feet in the setting of previous bilateral transmetatarsal amputations we have been using silver alginate. Last time I saw him 2 weeks ago I gave him a course of doxycycline. Things seem to have cleaned up. This is in response to a deep tissue culture that showed MRSA and Proteus 5/6; wounds on the bilateral plantar feet in the setting of previous transmetatarsal amputations. We have been using silver alginate. Arrives in the clinic today with the same raised  thick  callus and subcutaneous tissue. He has home health changing the dressing. He brought up the fact that he has transportation through [facility] apparently he only has "12 visits". Otherwise he has the pay $40 for a cab ride there and back from his home which I guess is [place] [address]. 11/11/19-Patient back with left transmet plantar surface much worse with extension of devitalized tissue proximally from the original wound with dense callus rim, the right is about the same, apparently patient was wearing tennis shoes at home per home health, although patient swears that he has been using his offloading devices that he has with inserts. 6/8; I have not seen this patient in over a month however he was seen last week in my absence. He has mirror-image wounds over his bilateral transmit plantar surfaces. He has been using silver alginate. He comes in today with literally a mountain of callus around the 2 wounds on the left. The area on the right somewhat better but still callus and nonviable tissue around the circular wound with some depth. He had an x-ray done last week of the left foot that was negative a culture showed multiple organisms. He did not get any antibiotics. He has custom-made shoes and apparently is getting another pair from [employer] that is in the process. I told him that we are not offloading the sufficiently for what ever reason. I do not think he is wearing his custom shoes at home although he says he is not walking that much 6/17 bilateral plantar foot in the setting of previous transmetatarsal amputations. He has been using silver alginate. He has his own custom made shoes which he is supposed to have it replaced in the next week or 2. We have been extensively debriding the callus around both wound sites and things look slightly better today 6/24; patient arrives in clinic today with her intake nurse pointing out odor and drainage. We have been using silver alginate. I  was going to put a total contact cast on this leg today but in view of the odor and drainage will postpone that Electronic Signature(s) Signed: 12/10/2019 5:27:17 PM By: Linton Ham MD Entered By: Linton Ham on 12/09/2019 14:03:14 -------------------------------------------------------------------------------- Physical Exam Details Patient Name: Date of Service: [name], [name] 12/09/2019 1:00 PM Medical Record Number: [id number] Patient Account Number: [id number] Date of Birth/Sex: Treating RN: 02-08-1941 (79 y.o. [name] Primary Care Provider: Leeroy [name] Other Clinician: Referring Provider: Treating Provider/Extender: [name] in Treatment: 20 Constitutional Sitting or standing Blood Pressure is within target range for patient.. Pulse regular and within target range for patient.Marland Kitchen Respirations regular, non-labored and within target range.. Temperature is normal and within the target range for the patient.Marland Kitchen Appears in no distress. Notes Wound exam Right foot the wound did not look ominous. I removed skin and subcutaneous tissue from around the wound margins. The undermining areas were also removed. I used a #5 curette. After that I did a deep tissue culture for CandS. There was no overt evidence of surrounding infection Left foot wound small open area but a lot of callus around the wound. No debridement in this area today Electronic Signature(s) Signed: 12/10/2019 5:27:17 PM By: Linton Ham MD Entered By: Linton Ham on 12/09/2019 14:04:13 -------------------------------------------------------------------------------- Physician Orders Details Patient Name: Date of Service: [name], [name] 12/09/2019 1:00 PM Medical Record Number: [id number] Patient Account Number: [id number] Date of Birth/Sex: Treating RN: 1940/08/13 (79 y.o. M) Rolin Barry, Tammi Klippel  Primary Care Provider: Leeroy [name] Other  Clinician: Referring Provider: Treating Provider/Extender: [name] in Treatment: 20 Verbal / Phone Orders: No Diagnosis Coding ICD-10 Coding Code Description E11.621 Type 2 diabetes mellitus with foot ulcer L97.522 Non-pressure chronic ulcer of other part of left foot with fat layer exposed L97.512 Non-pressure chronic ulcer of other part of right foot with fat layer exposed I73.89 Other specified peripheral vascular diseases F17.218 Nicotine dependence, cigarettes, with other nicotine-induced disorders I10 Essential (primary) hypertension L84 Corns and callosities Follow-up Appointments Return Appointment in 1 week. Dressing Change Frequency Change dressing three times week. Wound Cleansing Clean wound with Wound Cleanser May shower with protection. Primary Wound Dressing Wound #21 Left,Plantar Foot Calcium Alginate with Silver Wound #22 Right Metatarsal head first lginate with Silver - ensure to pack into wound bed and any undermining. Calcium A Secondary Dressing Wound #21 Left,Plantar Foot Foam - foam donut to offload Kerlix/Rolled Gauze Dry Gauze Wound #22 Right Metatarsal head first Foam - foam donut to offload Kerlix/Rolled Gauze Dry Gauze Edema Control Avoid standing for long periods of time Elevate legs to the level of the heart or above for 30 minutes daily and/or when sitting, a frequency of: - throughout the day. Off-Loading Other: - felt patient's personal shoes. patient to wear diabetic shoes. patient to limit about of time spent walking and standing. [place] [facility] for wound care. - [facility] erobe culture (MICRO) - right foot deep tissue wound culture. - (ICD10 E11.621 - Type 2 diabetes Bacteria identified in Unspecified specimen by A mellitus with foot ulcer) LOINC Code: 130-8 Convenience Name: Areobic culture-specimen not specified Electronic Signature(s) Signed:  12/09/2019 5:34:37 PM By: Deon Pilling Signed: 12/10/2019 5:27:17 PM By: Linton Ham MD Entered By: Deon Pilling on 12/09/2019 13:59:31 -------------------------------------------------------------------------------- Problem List Details Patient Name: Date of Service: [name], [name] 12/09/2019 1:00 PM Medical Record Number: [id number] Patient Account Number: [id number] Date of Birth/Sex: Treating RN: 09-Aug-1940 (79 y.o. [name], Meta.Reding Primary Care Provider: Leeroy [name] Other Clinician: Referring Provider: Treating Provider/Extender: [name] in Treatment: 20 Active Problems ICD-10 Encounter Code Description Active Date MDM Diagnosis E11.621 Type 2 diabetes mellitus with foot ulcer 07/21/2019 No Yes L97.522 Non-pressure chronic ulcer of other part of left foot with fat layer exposed 07/21/2019 No Yes L97.512 Non-pressure chronic ulcer of other part of right foot with fat layer exposed 07/21/2019 No Yes I73.89 Other specified peripheral vascular diseases 07/21/2019 No Yes F17.218 Nicotine dependence, cigarettes, with other nicotine-induced disorders 07/21/2019 No Yes I10 Essential (primary) hypertension 07/21/2019 No Yes L84 Corns and callosities 07/21/2019 No Yes Inactive Problems Resolved Problems Electronic Signature(s) Signed: 12/10/2019 5:27:17 PM By: Linton Ham MD Entered By: Linton Ham on 12/09/2019 14:02:13 -------------------------------------------------------------------------------- Progress Note Details Patient Name: Date of Service: [name], [name] 12/09/2019 1:00 PM Medical Record Number: [id number] Patient Account Number: [id number] Date of Birth/Sex: Treating RN: 17-Jan-1941 (79 y.o. [name] Primary Care Provider: Leeroy [name] Other Clinician: Referring Provider: Treating Provider/Extender: [name] in Treatment: 20 Subjective History of Present Illness  (HPI) very pleasant 79 year old gentleman has type 2 diabetes and has been having an ulcer on his foot for several years. Been seeing as this time around since the end of October. Classified as a Wagner grade 2 because his previous x-rays did not show any problems. He has an MRI pending this Friday. discussing with him he says he has not been  able to get his diabetic shoes yet. He continues to smoke his pipe and drinks moonshine and says he will not give this up. I have tried to counsel him regarding this but he firmly says that he is happy to listen to me but he is not going to give up his habits. 08/17/14 -- patient has no fresh complaints but has recently come with his MRI which was done on 08/12/2014. 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 Readmission: 07/21/2019 upon evaluation today patient presents for initial inspection here in our clinic concerning issues that he has been having with his bilateral feet. He has open wounds that been present at least since the beginning of the year although he does not know an exact time. He is previously had amputations of all toes and the distal portion of his foot. 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 With that being said he does have a history of diabetes, peripheral vascular disease, nicotine dependence, and hypertension. He has not had any recent arterial or vascular studies he was noncompressible today I think he is going to require referral for arterial studies as well. 2/11; patient was readmitted to our clinic last week. He has bilateral plantar foot wounds in the setting of previous transmetatarsal amputations. He had his arterial studies done on 2/8; these were actually quite good. He was noncompressible on both sides however his waveforms were triphasic. The thought was noncompressible vessels consistent with medial  calcification but without significant stenosis in the lower extremities. X-rays were done on the left this showed extensive chronic and postoperative changes without evidence of osteomyelitis. There was no evidence of osteomyelitis on the right foot there was mild vascular calcification MRI was suggested on the left based on clinical correlation. The patient arrives in clinic with some odor and drainage from the left foot. Right foot measures smaller. He is really not offloading these areas although he says he does not walk much 2/25; the patient arrives with some odor and drainage from the left foot again. Post debridement I cultured this area. He has the area on the midfoot roughly the third metatarsal head and the first metatarsal head on the right. He is in surgical shoes bilateral transmet 3/4; culture from last week showed abundant group B strep and a few Proteus. We will put him on cephalexin starting today. Still silver alginate to the wounds 3/11; due to issues with pharmacy he still does not have the cephalexin that I prescribed a week ago. Apparently this was called into a different pharmacy on Friday but they have not delivered it to him. We have been using silver alginate on the wounds but because of lack of moisture change to silver collagen today. We are offloading as best we can in surgical shoes 3/18; he is taking the cephalexin that I prescribed 2 weeks ago. Using silver collagen. He has surgical shoes. Setting of bilateral previous transmetatarsal amputations. Wounds are mirror-image wounds medially. 3/25; mirror-image wounds in the setting of previous transmetatarsal amputation. The wounds are mirror image medial foot wounds. We have been using silver collagen. We are using surgical shoes offloaded with felt. He came in today asking about his "$900" custom-made shoes although after talking to them it does not appear that he ever really wore these and they are more than a year  old 4/8; mirror-image wounds on the medial part of the plantar feet  in the setting of previous transmetatarsal amputations. We have been using silver collagen. I aggressively debrided these 2 weeks ago removing callus subcutaneous tissue from around the wound margins. He did indeed bring echo he has custom-made shoes with transmetatarsal inserts. He has never worn these. He claims to be not spending a lot of time on his feet 4/15; mirror-image wounds on the plantar feet in the setting of previous transmetatarsal amputation. We have been using silver alginate. Culture that I did last time grew MRSA and Proteus. I have him on doxycycline. This was a deep tissue culture. The Proteus was not actively plated against doxycycline but was otherwise pansensitive I think the doxycycline would cover this. His wounds actually looks somewhat better. Rolled senescent edges still around the wound edges 4/29; mirror-image wounds on the plantar feet in the setting of previous bilateral transmetatarsal amputations we have been using silver alginate. Last time I saw him 2 weeks ago I gave him a course of doxycycline. Things seem to have cleaned up. This is in response to a deep tissue culture that showed MRSA and Proteus 5/6; wounds on the bilateral plantar feet in the setting of previous transmetatarsal amputations. We have been using silver alginate. Arrives in the clinic today with the same raised thick callus and subcutaneous tissue. He has home health changing the dressing. He brought up the fact that he has transportation through [facility] apparently he only has "12 visits". Otherwise he has the pay $40 for a cab ride there and back from his home which I guess is [place] [address]. 11/11/19-Patient back with left transmet plantar surface much worse with extension of devitalized tissue proximally from the original wound with dense callus rim, the right is about the same, apparently patient was wearing  tennis shoes at home per home health, although patient swears that he has been using his offloading devices that he has with inserts. 6/8; I have not seen this patient in over a month however he was seen last week in my absence. He has mirror-image wounds over his bilateral transmit plantar surfaces. He has been using silver alginate. He comes in today with literally a mountain of callus around the 2 wounds on the left. The area on the right somewhat better but still callus and nonviable tissue around the circular wound with some depth. He had an x-ray done last week of the left foot that was negative a culture showed multiple organisms. He did not get any antibiotics. He has custom-made shoes and apparently is getting another pair from [employer] that is in the process. I told him that we are not offloading the sufficiently for what ever reason. I do not think he is wearing his custom shoes at home although he says he is not walking that much 6/17 bilateral plantar foot in the setting of previous transmetatarsal amputations. He has been using silver alginate. He has his own custom made shoes which he is supposed to have it replaced in the next week or 2. We have been extensively debriding the callus around both wound sites and things look slightly better today 6/24; patient arrives in clinic today with her intake nurse pointing out odor and drainage. We have been using silver alginate. I was going to put a total contact cast on this leg today but in view of the odor and drainage will postpone that Objective Constitutional Sitting or standing Blood Pressure is within target range for patient.. Pulse regular and within target range for patient.Marland Kitchen  Respirations regular, non-labored and within target range.. Temperature is normal and within the target range for the patient.Marland Kitchen Appears in no distress. Vitals Time Taken: 1:34 PM, Height: 73 in, Weight: 180 lbs, BMI: 23.7, Temperature: 98.5 F, Pulse: 61 bpm,  Respiratory Rate: 18 breaths/min, Blood Pressure: 137/85 mmHg, Capillary Blood Glucose: 125 mg/dl. General Notes: glucose per pt report General Notes: Wound exam ooRight foot the wound did not look ominous. I removed skin and subcutaneous tissue from around the wound margins. The undermining areas were also removed. I used a #5 curette. After that I did a deep tissue culture for CandS. There was no overt evidence of surrounding infection ooLeft foot wound small open area but a lot of callus around the wound. No debridement in this area today Integumentary (Hair, Skin) Wound #21 status is Open. Original cause of wound was Gradually Appeared. The wound is located on the [place]. The wound measures 0.2cm length x 0.3cm width x 0.3cm depth; 0.047cm^2 area and 0.014cm^3 volume. There is Fat Layer (Subcutaneous Tissue) Exposed exposed. There is no tunneling noted, however, there is undermining starting at 12:00 and ending at 12:00 with a maximum distance of 0.2cm. There is a medium amount of serosanguineous drainage noted. The wound margin is thickened. There is large (67-100%) pink granulation within the wound bed. There is no necrotic tissue within the wound bed. Wound #22 status is Open. Original cause of wound was Gradually Appeared. The wound is located on the Right Metatarsal head first. The wound measures 0.9cm length x 1.4cm width x 0.6cm depth; 0.99cm^2 area and 0.594cm^3 volume. There is Fat Layer (Subcutaneous Tissue) Exposed exposed. There is no tunneling noted, however, there is undermining starting at 12:00 and ending at 12:00 with a maximum distance of 0.5cm. There is a medium amount of serosanguineous drainage noted. The wound margin is thickened. There is large (67-100%) pink granulation within the wound bed. There is no necrotic tissue within the wound bed. 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 There was a Excisional Skin/Subcutaneous Tissue Debridement with a total area of 1.5 sq cm performed by [name]., MD. With the following instrument(s): Curette, and Forceps to remove Viable and Non-Viable tissue/material. Material removed includes Callus, Subcutaneous Tissue, Slough, Skin: Dermis, and Fibrin/Exudate after achieving pain control using Lidocaine 4% T opical Solution. 1 specimen was taken by a Tissue Culture and sent to the lab per facility protocol. A time out was conducted at 13:45, prior to the start of the procedure. A Moderate amount of bleeding was controlled with Silver Nitrate. The procedure was tolerated well with a pain level of 0 throughout and a pain level of 0 following the procedure. Post Debridement Measurements: 0.9cm length x 1.4cm width x 0.6cm depth; 0.594cm^3 volume. Character of Wound/Ulcer Post Debridement requires further debridement. Severity of Tissue Post Debridement is: Fat layer exposed. Post procedure Diagnosis Wound #22: Same as Pre-Procedure Plan Follow-up Appointments: Return Appointment in 1 week. Dressing Change Frequency: Change dressing three times week. Wound Cleansing: Clean wound with Wound  Cleanser May shower with protection. Primary Wound Dressing: Wound #21 Left,Plantar Foot: Calcium Alginate with Silver Wound #22 Right Metatarsal head first: Calcium Alginate with Silver - ensure to pack into wound bed and any undermining. Secondary  Dressing: Wound #21 Left,Plantar Foot: Foam - foam donut to offload Kerlix/Rolled Gauze Dry Gauze Wound #22 Right Metatarsal head first: Foam - foam donut to offload Kerlix/Rolled Gauze Dry Gauze Edema Control: Avoid standing for long periods of time Elevate legs to the level of the heart or above for 30 minutes daily and/or when sitting, a frequency of: - throughout the day. Off-Loading: Other: - felt patient's personal shoes. patient to wear diabetic shoes. patient to limit about of time spent walking and standing. Home Health: [facility] for wound care. - [facility] ordered were: Areobic culture-specimen not specified - right foot deep tissue wound culture. 1. I am continuing silver alginate to both wound areas. 2. I do not think we are offloading either 1 of these areas successfully 3. In view of the odor and drainage complaint aggressive debridement with a deep tissue culture but no empiric antibiotics given. Electronic Signature(s) Signed: 12/10/2019 5:27:17 PM By: Linton Ham MD Entered By: Linton Ham on 12/09/2019 14:05:33 -------------------------------------------------------------------------------- SuperBill Details Patient Name: Date of Service: [name], [name] 12/09/2019 Medical Record Number: [id number] Patient Account Number: [id number] Date of Birth/Sex: Treating RN: [birth year] (79 y.o. [name], Meta.Reding Primary Care Provider: Leeroy [name] Other Clinician: Referring Provider: Treating Provider/Extender: [name] in Treatment: 20 Diagnosis Coding ICD-10 Codes Code Description E11.621 Type 2 diabetes mellitus with foot ulcer L97.522 Non-pressure chronic ulcer of other part of left foot with fat layer exposed L97.512 Non-pressure chronic ulcer of other part of right foot with fat layer exposed I73.89 Other specified peripheral vascular diseases F17.218 Nicotine dependence,  cigarettes, with other nicotine-induced disorders I10 Essential (primary) hypertension L84 Corns and callosities Facility Procedures The patient participates with Medicare or their insurance follows the Medicare Facility Guidelines: CPT4 Code Description Modifier Quantity [id number] 11042 - DEB SUBQ TISSUE 20 SQ CM/< 1 ICD-10 Diagnosis Description L97.512 Non-pressure chronic ulcer of  other part of right foot with fat layer exposed E11.621 Type 2 diabetes mellitus with foot ulcer Physician Procedures : CPT4 Code Description Modifier [id number] - WC PHYS SUBQ TISS 20 SQ CM ICD-10 Diagnosis Description L97.512 Non-pressure chronic ulcer of other part of right foot with fat layer exposed E11.621 Type 2 diabetes mellitus with foot ulcer Quantity: 1 Electronic Signature(s) Signed: 12/10/2019 5:27:17 PM By: Linton Ham MD Entered By: Linton Ham on 12/09/2019 14:05:46

## 2019-12-14 LAB — AEROBIC/ANAEROBIC CULTURE W GRAM STAIN (SURGICAL/DEEP WOUND): Gram Stain: NONE SEEN

## 2019-12-16 ENCOUNTER — Other Ambulatory Visit: Payer: Self-pay

## 2019-12-16 ENCOUNTER — Ambulatory Visit (HOSPITAL_COMMUNITY)
Admission: RE | Admit: 2019-12-16 | Discharge: 2019-12-16 | Disposition: A | Discharge status: Home / Self Care | Payer: Medicare Other | Source: Ambulatory Visit | Attending: Internal Medicine | Admitting: Internal Medicine

## 2019-12-16 ENCOUNTER — Encounter (HOSPITAL_BASED_OUTPATIENT_CLINIC_OR_DEPARTMENT_OTHER): Payer: Medicare Other | Attending: Internal Medicine | Admitting: Internal Medicine

## 2019-12-16 ENCOUNTER — Other Ambulatory Visit (HOSPITAL_COMMUNITY): Payer: Self-pay | Admitting: Internal Medicine

## 2019-12-16 DIAGNOSIS — L03115 Cellulitis of right lower limb: Secondary | ICD-10-CM | POA: Insufficient documentation

## 2019-12-16 DIAGNOSIS — L97522 Non-pressure chronic ulcer of other part of left foot with fat layer exposed: Secondary | ICD-10-CM | POA: Diagnosis present

## 2019-12-16 DIAGNOSIS — L97512 Non-pressure chronic ulcer of other part of right foot with fat layer exposed: Secondary | ICD-10-CM | POA: Insufficient documentation

## 2019-12-16 DIAGNOSIS — I7389 Other specified peripheral vascular diseases: Secondary | ICD-10-CM | POA: Insufficient documentation

## 2019-12-16 DIAGNOSIS — E1140 Type 2 diabetes mellitus with diabetic neuropathy, unspecified: Secondary | ICD-10-CM | POA: Diagnosis not present

## 2019-12-16 DIAGNOSIS — E11621 Type 2 diabetes mellitus with foot ulcer: Secondary | ICD-10-CM | POA: Diagnosis not present

## 2019-12-17 NOTE — Progress Notes (Signed)
DIOR, STEPTER ([id number]) Visit Report for 12/16/2019 Arrival Information Details Patient Name: Date of Service: [name], [place] ND 12/16/2019 8:15 A M Medical Record Number: [id number] Patient Account Number: [id number] Date of Birth/Sex: Treating RN: [birth year] (79 y.o. [name] Primary Care Lanique Gonzalo: [name] Other Clinician: Referring Ieisha Gao: Treating Yuta Cipollone/Extender: Gabriel Rainwater in Treatment: 21 Visit Information History Since Last Visit Added or deleted any medications: No Patient Arrived: Cane Any new allergies or adverse reactions: No Arrival Time: 08:34 Had a fall or experienced change in No Accompanied By: self activities of daily living that may affect Transfer Assistance: None risk of falls: Patient Identification Verified: Yes Signs or symptoms of abuse/neglect since last visito No Secondary Verification Process Completed: Yes Hospitalized since last visit: No Patient Requires Transmission-Based Precautions: No Implantable device outside of the clinic excluding No Patient Has Alerts: Yes cellular tissue based products placed in the center Patient Alerts: R ABI non compressible since last visit: L ABI non compressible Has Dressing in Place as Prescribed: Yes Pain Present Now: No Electronic Signature(s) Signed: 12/16/2019 5:46:52 PM By: Kela Millin Entered By: Kela Millin on 12/16/2019 08:34:59 -------------------------------------------------------------------------------- Encounter Discharge Information Details Patient Name: Date of Service: [name], [name] 12/16/2019 8:15 A M Medical Record Number: [id number] Patient Account Number: [id number] Date of Birth/Sex: Treating RN: 03/06/1941 (79 y.o. [name]) [name] Primary Care Karine Garn: [name] Other Clinician: Referring Tanielle Emigh: Treating Corie Allis/Extender: Gabriel Rainwater in Treatment: 21 Encounter  Discharge Information Items Post Procedure Vitals Discharge Condition: Stable Temperature (F): 98.3 Ambulatory Status: Cane Pulse (bpm): 65 Discharge Destination: Home Respiratory Rate (breaths/min): 18 Transportation: Private Auto Blood Pressure (mmHg): 114/73 Accompanied By: self Schedule Follow-up Appointment: Yes Clinical Summary of Care: Patient Declined Electronic Signature(s) Signed: 12/17/2019 3:42:07 PM By: [name] RN Entered By: [name] on 12/16/2019 09:08:10 -------------------------------------------------------------------------------- Lower Extremity Assessment Details Patient Name: Date of Service: [name], [name] 12/16/2019 8:15 A M Medical Record Number: [id number] Patient Account Number: [id number] Date of Birth/Sex: Treating RN: 08-Dec-1940 (79 y.o. [name] Primary Care Milda Lindvall: [name] Other Clinician: Referring Abdirahman Chittum: Treating Husein Guedes/Extender: Gabriel Rainwater in Treatment: 21 Edema Assessment Assessed: [name]: No] Patrice Paradise: No] Edema: [Left: Yes] [Right: Yes] Calf Left: Right: Point of Measurement: 32 cm From Medial Instep 31 cm 34 cm Ankle Left: Right: Point of Measurement: 10 cm From Medial Instep 22.5 cm 25 cm Vascular Assessment Pulses: Dorsalis Pedis Palpable: [Left:No] [Right:No] Electronic Signature(s) Signed: 12/16/2019 5:46:52 PM By: Kela Millin Entered By: Kela Millin on 12/16/2019 08:35:56 -------------------------------------------------------------------------------- Multi Wound Chart Details Patient Name: Date of Service: [name], [name] 12/16/2019 8:15 A M Medical Record Number: [id number] Patient Account Number: [id number] Date of Birth/Sex: Treating RN: [birth year] (79 y.o. [name] Primary Care Correna Meacham: [name] Other Clinician: Referring Dejanee Thibeaux: Treating Jove Beyl/Extender: Gabriel Rainwater in  Treatment: 21 Vital Signs Height(in): 73 Pulse(bpm): 44 Weight(lbs): 180 Blood Pressure(mmHg): 114/73 Body Mass Index(BMI): 24 Temperature(F): 98.3 Respiratory Rate(breaths/min): 19 Photos: [21:No Photos Left, Plantar Foot] [22:No Photos Right Metatarsal head first] [N/A:N/A N/A] Wound Location: [21:Gradually Appeared] [22:Gradually Appeared] [N/A:N/A] Wounding Event: [21:Diabetic Wound/Ulcer of the Lower] [22:Diabetic Wound/Ulcer of the Lower] [N/A:N/A] Primary Etiology: [21:Extremity Cataracts, Glaucoma, Hypertension,] [22:Extremity Cataracts, Glaucoma, Hypertension,] [N/A:N/A] Comorbid History: [21:Peripheral Arterial Disease, Type II Diabetes, Osteomyelitis, Neuropathy 06/17/2018] [22:Peripheral Arterial Disease, Type II Diabetes, Osteomyelitis, Neuropathy 06/17/2018] [N/A:N/A] Date Acquired: [21:21] [22:21] [N/A:N/A] Weeks of Treatment: [21:Open] [  22:Open] [N/A:N/A] Wound Status: [21:0.4x0.4x0.7] [22:1.1x1.2x0.8] [N/A:N/A] Measurements L x W x D (cm) [21:0.126] [22:1.037] [N/A:N/A] A (cm) : rea [21:0.088] [22:0.829] [N/A:N/A] Volume (cm) : [21:94.50%] [22:-65.10%] [N/A:N/A] % Reduction in A rea: [21:92.40%] [22:-341.00%] [N/A:N/A] % Reduction in Volume: [21:12] [22:12] Starting Position 1 (o'clock): [21:12] [22:12] Ending Position 1 (o'clock): [21:0.9] [22:1.1] Maximum Distance 1 (cm): [21:Yes] [22:Yes] [N/A:N/A] Undermining: [21:Grade 2] [22:Grade 2] [N/A:N/A] Classification: [21:Medium] [22:Medium] [N/A:N/A] Exudate A mount: [21:Serosanguineous] [22:Serosanguineous] [N/A:N/A] Exudate Type: [21:red, brown] [22:red, brown] [N/A:N/A] Exudate Color: [21:Yes] [22:Yes] [N/A:N/A] Foul Odor A Cleansing: [21:fter No] [22:No] [N/A:N/A] Odor A nticipated Due to Product Use: [21:Thickened] [22:Thickened] [N/A:N/A] Wound Margin: [21:Large (67-100%)] [22:Large (67-100%)] [N/A:N/A] Granulation A mount: [21:Pink] [22:Pink] [N/A:N/A] Granulation Quality: [21:None Present (0%)] [22:None  Present (0%)] [N/A:N/A] Necrotic A mount: [21:Fat Layer (Subcutaneous Tissue)] [22:Fat Layer (Subcutaneous Tissue)] [N/A:N/A] Exposed Structures: [21:Exposed: Yes Fascia: No Tendon: No Muscle: No Joint: No Bone: No Small (1-33%)] [22:Exposed: Yes Fascia: No Tendon: No Muscle: No Joint: No Bone: No Small (1-33%)] [N/A:N/A] Epithelialization: [21:Debridement - Excisional] [22:Debridement - Excisional] [N/A:N/A] Debridement: Pre-procedure Verification/Time Out 08:41 [22:08:41] [N/A:N/A] Taken: [21:Callus, Subcutaneous] [22:Callus, Subcutaneous] [N/A:N/A] Tissue Debrided: [21:Skin/Subcutaneous Tissue] [22:Skin/Subcutaneous Tissue] [N/A:N/A] Level: [21:0.16] [22:1.32] [N/A:N/A] Debridement A (sq cm): [21:rea Curette] [22:Curette] [N/A:N/A] Instrument: [21:Minimum] [22:Minimum] [N/A:N/A] Bleeding: [21:Pressure] [22:Pressure] [N/A:N/A] Hemostasis A chieved: [21:0] [22:0] [N/A:N/A] Procedural Pain: [21:0] [22:0] [N/A:N/A] Post Procedural Pain: [21:Procedure was tolerated well] [22:Procedure was tolerated well] [N/A:N/A] Debridement Treatment Response: [21:0.4x0.4x0.7] [22:1.1x1.2x0.8] [N/A:N/A] Post Debridement Measurements L x W x D (cm) [21:0.088] [71:1.657] [N/A:N/A] Post Debridement Volume: (cm) [21:Debridement] [22:Debridement] [N/A:N/A] Treatment Notes Electronic Signature(s) Signed: 12/16/2019 5:47:00 PM By: Linton Ham MD Signed: 12/17/2019 4:42:40 PM By: Levan Hurst RN, BSN Entered By: Linton Ham on 12/16/2019 08:50:45 -------------------------------------------------------------------------------- Multi-Disciplinary Care Plan Details Patient Name: Date of Service: [name], [name] 12/16/2019 8:15 A M Medical Record Number: [id number] Patient Account Number: [id number] Date of Birth/Sex: Treating RN: [birth year] (79 y.o. [name] Primary Care Tao Satz: [name] Other Clinician: Referring Brunella Wileman: Treating Eryk Beavers/Extender: Gabriel Rainwater in Treatment: 21 Active Inactive Wound/Skin Impairment Nursing Diagnoses: Impaired tissue integrity Knowledge deficit related to smoking impact on wound healing Knowledge deficit related to ulceration/compromised skin integrity Goals: Patient will demonstrate a reduced rate of smoking or cessation of smoking Date Initiated: 07/21/2019 Date Inactivated: 11/23/2019 Target Resolution Date: 11/12/2019 Goal Status: Met Patient/caregiver will verbalize understanding of skin care regimen Date Initiated: 07/21/2019 Target Resolution Date: 01/14/2020 Goal Status: Active Ulcer/skin breakdown will have a volume reduction of 30% by week 4 Date Initiated: 07/21/2019 Date Inactivated: 09/23/2019 Target Resolution Date: 09/17/2019 Unmet Reason: see wound Goal Status: Unmet measurements. Interventions: Assess patient/caregiver ability to obtain necessary supplies Assess patient/caregiver ability to perform ulcer/skin care regimen upon admission and as needed Assess ulceration(s) every visit Provide education on smoking Provide education on ulcer and skin care Treatment Activities: Skin care regimen initiated : 07/21/2019 Topical wound management initiated : 07/21/2019 Notes: Electronic Signature(s) Signed: 12/17/2019 4:42:40 PM By: Levan Hurst RN, BSN Entered By: Levan Hurst on 12/16/2019 08:45:46 -------------------------------------------------------------------------------- Pain Assessment Details Patient Name: Date of Service: [name], [name] 12/16/2019 8:15 A M Medical Record Number: [id number] Patient Account Number: [id number] Date of Birth/Sex: Treating RN: 21-Feb-1941 (79 y.o. [name] Primary Care Brailyn Delman: [name] Other Clinician: Referring Payne Garske: Treating Devinn Hurwitz/Extender: Gabriel Rainwater in Treatment: 21 Active Problems Location of Pain Severity and Description of Pain Patient Has  Paino No Site Locations  Pain Management and Medication Current Pain Management: Electronic Signature(s) Signed: 12/16/2019 5:46:52 PM By: Kela Millin Entered By: Kela Millin on 12/16/2019 08:35:35 -------------------------------------------------------------------------------- Patient/Caregiver Education Details Patient Name: Date of Service: [name], [name] 7/1/2021andnbsp8:15 A M Medical Record Number: [id number] Patient Account Number: [id number] Date of Birth/Gender: Treating RN: 01-05-1941 (79 y.o. [name] Primary Care Physician: [name] Other Clinician: Referring Physician: Treating Physician/Extender: Gabriel Rainwater in Treatment: 21 Education Assessment Education Provided To: Patient Education Topics Provided Wound/Skin Impairment: Methods: Explain/Verbal Responses: State content correctly [employer]) Signed: 12/17/2019 4:42:40 PM By: Levan Hurst RN, BSN Entered By: Levan Hurst on 12/16/2019 08:55:02 -------------------------------------------------------------------------------- Wound Assessment Details Patient Name: Date of Service: [name], [name] 12/16/2019 8:15 A M Medical Record Number: [id number] Patient Account Number: [id number] Date of Birth/Sex: Treating RN: [birth year] (79 y.o. [name] Primary Care Shayli Altemose: [name] Other Clinician: Referring Chloe Miyoshi: Treating Satrina Magallanes/Extender: Gabriel Rainwater in Treatment: 21 Wound Status Wound Number: 21 Primary Diabetic Wound/Ulcer of the Lower Extremity Etiology: Wound Location: Left, Plantar Foot Wound Open Wounding Event: Gradually Appeared Status: Date Acquired: 06/17/2018 Comorbid Cataracts, Glaucoma, Hypertension, Peripheral Arterial Disease, Weeks Of Treatment: 21 History: Type II Diabetes, Osteomyelitis, Neuropathy Clustered Wound: No Photos Photo Uploaded By:  Mikeal Hawthorne on 12/17/2019 14:36:32 Wound Measurements Length: (cm) 0.4 Width: (cm) 0.4 Depth: (cm) 0.7 Area: (cm) 0.126 Volume: (cm) 0.088 % Reduction in Area: 94.5% % Reduction in Volume: 92.4% Epithelialization: Small (1-33%) Tunneling: No Undermining: Yes Starting Position (o'clock): 12 Ending Position (o'clock): 12 Maximum Distance: (cm) 0.9 Wound Description Classification: Grade 2 Wound Margin: Thickened Exudate Amount: Medium Exudate Type: Serosanguineous Exudate Color: red, brown Foul Odor After Cleansing: Yes Due to Product Use: No Slough/Fibrino No Wound Bed Granulation Amount: Large (67-100%) Exposed Structure Granulation Quality: Pink Fascia Exposed: No Necrotic Amount: None Present (0%) Fat Layer (Subcutaneous Tissue) Exposed: Yes Tendon Exposed: No Muscle Exposed: No Joint Exposed: No Bone Exposed: No Treatment Notes Wound #21 (Left, Plantar Foot) 1. Cleanse With Wound Cleanser 3. Primary Dressing Applied Calcium Alginate Ag 4. Secondary Dressing Dry Gauze Roll Gauze Foam 5. Secured With Recruitment consultant) Signed: 12/16/2019 5:46:52 PM By: Kela Millin Entered By: Kela Millin on 12/16/2019 08:37:00 -------------------------------------------------------------------------------- Wound Assessment Details Patient Name: Date of Service: [name], [name] 12/16/2019 8:15 A M Medical Record Number: [id number] Patient Account Number: [id number] Date of Birth/Sex: Treating RN: Nov 29, 1940 (79 y.o. [name] Primary Care Jiana Lemaire: [name] Other Clinician: Referring Kamaree Berkel: Treating Stephane Niemann/Extender: Gabriel Rainwater in Treatment: 21 Wound Status Wound Number: 22 Primary Diabetic Wound/Ulcer of the Lower Extremity Etiology: Wound Location: Right Metatarsal head first Wound Open Wounding Event: Gradually Appeared Status: Date Acquired: 06/17/2018 Comorbid Cataracts,  Glaucoma, Hypertension, Peripheral Arterial Disease, Weeks Of Treatment: 21 History: Type II Diabetes, Osteomyelitis, Neuropathy Clustered Wound: No Photos Photo Uploaded By: Mikeal Hawthorne on 12/17/2019 14:36:33 Wound Measurements Length: (cm) 1.1 Width: (cm) 1.2 Depth: (cm) 0.8 Area: (cm) 1.037 Volume: (cm) 0.829 % Reduction in Area: -65.1% % Reduction in Volume: -341% Epithelialization: Small (1-33%) Tunneling: No Undermining: Yes Starting Position (o'clock): 12 Ending Position (o'clock): 12 Maximum Distance: (cm) 1.1 Wound Description Classification: Grade 2 Wound Margin: Thickened Exudate Amount: Medium Exudate Type: Serosanguineous Exudate Color: red, brown Foul Odor After Cleansing: Yes Due to Product Use: No Slough/Fibrino No Wound Bed Granulation Amount: Large (67-100%) Exposed Structure Granulation Quality: Pink Fascia Exposed: No Necrotic Amount: None Present (0%)  Fat Layer (Subcutaneous Tissue) Exposed: Yes Tendon Exposed: No Muscle Exposed: No Joint Exposed: No Bone Exposed: No Treatment Notes Wound #22 (Right Metatarsal head first) 1. Cleanse With Wound Cleanser 3. Primary Dressing Applied Calcium Alginate Ag 4. Secondary Dressing Dry Gauze Roll Gauze Foam 5. Secured With Recruitment consultant) Signed: 12/16/2019 5:46:52 PM By: Kela Millin Entered By: Kela Millin on 12/16/2019 08:37:16 -------------------------------------------------------------------------------- Vitals Details Patient Name: Date of Service: [name], [name] 12/16/2019 8:15 A M Medical Record Number: [id number] Patient Account Number: [id number] Date of Birth/Sex: Treating RN: 1940/08/29 (79 y.o. [name] Primary Care Rosmarie Esquibel: [name] Other Clinician: Referring Zealand Boyett: Treating Emali Heyward/Extender: Gabriel Rainwater in Treatment: 21 Vital Signs Time Taken: 08:30 Temperature (F): 98.3 Height  (in): 73 Pulse (bpm): 65 Weight (lbs): 180 Respiratory Rate (breaths/min): 19 Body Mass Index (BMI): 23.7 Blood Pressure (mmHg): 114/73 Reference Range: 80 - 120 mg / dl Notes patient stated he did not check CBG this morning Electronic Signature(s) Signed: 12/16/2019 5:46:52 PM By: Kela Millin Entered By: Kela Millin on 12/16/2019 08:35:29

## 2019-12-17 NOTE — Progress Notes (Signed)
SHERIF, MILLSPAUGH ([id number]) Visit Report for 12/16/2019 Debridement Details Patient Name: Date of Service: [name], [place] [name] 12/16/2019 8:15 A M Medical Record Number: [id number] Patient Account Number: [id number] Date of Birth/Sex: Treating RN: [birth year] (79 y.o. [name] Primary Care Provider: [name] Other Clinician: Referring Provider: Treating Provider/Extender: Gabriel Rainwater in Treatment: 21 Debridement Performed for Assessment: Wound #21 [place] Performed By: Physician Ricard Dillon., MD Debridement Type: Debridement Severity of Tissue Pre Debridement: Fat layer exposed Level of Consciousness (Pre-procedure): Awake and Alert Pre-procedure Verification/Time Out Yes - 08:41 Taken: Start Time: 08:41 T Area Debrided (L x W): otal 0.4 (cm) x 0.4 (cm) = 0.16 (cm) Tissue and other material debrided: Viable, Non-Viable, Callus, Subcutaneous, Skin: Epidermis Level: Skin/Subcutaneous Tissue Debridement Description: Excisional Instrument: Curette Bleeding: Minimum Hemostasis Achieved: Pressure End Time: 08:43 Procedural Pain: 0 Post Procedural Pain: 0 Response to Treatment: Procedure was tolerated well Level of Consciousness (Post- Awake and Alert procedure): Post Debridement Measurements of Total Wound Length: (cm) 0.4 Width: (cm) 0.4 Depth: (cm) 0.7 Volume: (cm) 0.088 Character of Wound/Ulcer Post Debridement: Improved Severity of Tissue Post Debridement: Fat layer exposed Post Procedure Diagnosis Same as Pre-procedure Electronic Signature(s) Signed: 12/16/2019 5:47:00 PM By: Linton Ham MD Signed: 12/17/2019 4:42:40 PM By: Levan Hurst RN, BSN Entered By: Linton Ham on 12/16/2019 08:50:54 -------------------------------------------------------------------------------- Debridement Details Patient Name: Date of Service: [name] 12/16/2019 8:15 A M Medical Record Number: [id number] Patient  Account Number: [id number] Date of Birth/Sex: Treating RN: November 18, 1940 (79 y.o. [name] Primary Care Provider: [name] Other Clinician: Referring Provider: Treating Provider/Extender: Gabriel Rainwater in Treatment: 21 Debridement Performed for Assessment: Wound #22 Right Metatarsal head first Performed By: Physician Ricard Dillon., MD Debridement Type: Debridement Severity of Tissue Pre Debridement: Fat layer exposed Level of Consciousness (Pre-procedure): Awake and Alert Pre-procedure Verification/Time Out Yes - 08:41 Taken: Start Time: 08:41 T Area Debrided (L x W): otal 1.1 (cm) x 1.2 (cm) = 1.32 (cm) Tissue and other material debrided: Viable, Non-Viable, Callus, Subcutaneous, Skin: Epidermis Level: Skin/Subcutaneous Tissue Debridement Description: Excisional Instrument: Curette Bleeding: Minimum Hemostasis Achieved: Pressure End Time: 08:43 Procedural Pain: 0 Post Procedural Pain: 0 Response to Treatment: Procedure was tolerated well Level of Consciousness (Post- Awake and Alert procedure): Post Debridement Measurements of Total Wound Length: (cm) 1.1 Width: (cm) 1.2 Depth: (cm) 0.8 Volume: (cm) 0.829 Character of Wound/Ulcer Post Debridement: Improved Severity of Tissue Post Debridement: Fat layer exposed Post Procedure Diagnosis Same as Pre-procedure Electronic Signature(s) Signed: 12/16/2019 5:47:00 PM By: Linton Ham MD Signed: 12/17/2019 4:42:40 PM By: Levan Hurst RN, BSN Entered By: Linton Ham on 12/16/2019 08:51:01 -------------------------------------------------------------------------------- HPI Details Patient Name: Date of Service: [name] 12/16/2019 8:15 A M Medical Record Number: [id number] Patient Account Number: [id number] Date of Birth/Sex: Treating RN: 11/25/1940 (79 y.o. [name] Primary Care Provider: [name] Other Clinician: Referring  Provider: Treating Provider/Extender: Gabriel Rainwater in Treatment: 21 History of Present Illness HPI Description: very pleasant 79 year old gentleman has type 2 diabetes and has been having an ulcer on his foot for several years. Been seeing as this time around since the end of October. Classified as a Wagner grade 2 because his previous x-rays did not show any problems. He has an MRI pending this Friday. discussing with him he says he has not been able to get his diabetic shoes yet. He continues to smoke his  pipe and drinks moonshine and says he will not give this up. I have tried to counsel him regarding this but he firmly says that he is happy to listen to me but he is not going to give up his habits. 08/17/14 -- patient has no fresh complaints but has recently come with his MRI which was done on 08/12/2014. 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 Readmission: 07/21/2019 upon evaluation today patient presents for initial inspection here in our clinic concerning issues that he has been having with his bilateral feet. He has open wounds that been present at least since the beginning of the year although he does not know an exact time. He is previously had amputations of all toes and the distal portion of his foot. 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 With that being said he does have a history of diabetes, peripheral vascular disease, nicotine dependence, and hypertension. He has not had any recent arterial or vascular studies he was noncompressible today I think he is going to require referral for arterial studies as well. 2/11; patient was readmitted to our clinic last week. He has bilateral plantar foot wounds in the setting of previous transmetatarsal amputations. He had his arterial studies done on 2/8; these were actually quite  good. He was noncompressible on both sides however his waveforms were triphasic. The thought was noncompressible vessels consistent with medial calcification but without significant stenosis in the lower extremities. X-rays were done on the left this showed extensive chronic and postoperative changes without evidence of osteomyelitis. There was no evidence of osteomyelitis on the right foot there was mild vascular calcification MRI was suggested on the left based on clinical correlation. The patient arrives in clinic with some odor and drainage from the left foot. Right foot measures smaller. He is really not offloading these areas although he says he does not walk much 2/25; the patient arrives with some odor and drainage from the left foot again. Post debridement I cultured this area. He has the area on the midfoot roughly the third metatarsal head and the first metatarsal head on the right. He is in surgical shoes bilateral transmet 3/4; culture from last week showed abundant group B strep and a few Proteus. We will put him on cephalexin starting today. Still silver alginate to the wounds 3/11; due to issues with pharmacy he still does not have the cephalexin that I prescribed a week ago. Apparently this was called into a different pharmacy on Friday but they have not delivered it to him. We have been using silver alginate on the wounds but because of lack of moisture change to silver collagen today. We are offloading as best we can in surgical shoes 3/18; he is taking the cephalexin that I prescribed 2 weeks ago. Using silver collagen. He has surgical shoes. Setting of bilateral previous transmetatarsal amputations. Wounds are mirror-image wounds medially. 3/25; mirror-image wounds in the setting of previous transmetatarsal amputation. The wounds are mirror image medial foot wounds. We have been using silver collagen. We are using surgical shoes offloaded with felt. He came in today asking about  his "$900" custom-made shoes although after talking to them it does not appear that he ever really wore these and they are more than a year old 4/8; mirror-image wounds on the medial part of the plantar feet in the setting of previous transmetatarsal amputations. We have been using silver  collagen. I aggressively debrided these 2 weeks ago removing callus subcutaneous tissue from around the wound margins. He did indeed bring echo he has custom-made shoes with transmetatarsal inserts. He has never worn these. He claims to be not spending a lot of time on his feet 4/15; mirror-image wounds on the plantar feet in the setting of previous transmetatarsal amputation. We have been using silver alginate. Culture that I did last time grew MRSA and Proteus. I have him on doxycycline. This was a deep tissue culture. The Proteus was not actively plated against doxycycline but was otherwise pansensitive I think the doxycycline would cover this. His wounds actually looks somewhat better. Rolled senescent edges still around the wound edges 4/29; mirror-image wounds on the plantar feet in the setting of previous bilateral transmetatarsal amputations we have been using silver alginate. Last time I saw him 2 weeks ago I gave him a course of doxycycline. Things seem to have cleaned up. This is in response to a deep tissue culture that showed MRSA and Proteus 5/6; wounds on the bilateral plantar feet in the setting of previous transmetatarsal amputations. We have been using silver alginate. Arrives in the clinic today with the same raised thick callus and subcutaneous tissue. He has home health changing the dressing. He brought up the fact that he has transportation through [facility] apparently he only has "12 visits". Otherwise he has the pay $40 for a cab ride there and back from his home which I guess [place] [address]. 11/11/19-Patient back with left transmet plantar surface much worse with extension of  devitalized tissue proximally from the original wound with dense callus rim, the right is about the same, apparently patient was wearing tennis shoes at home per home health, although patient swears that he has been using his offloading devices that he has with inserts. 6/8; I have not seen this patient in over a month however he was seen last week in my absence. He has mirror-image wounds over his bilateral transmit plantar surfaces. He has been using silver alginate. He comes in today with literally a mountain of callus around the 2 wounds on the left. The area on the right somewhat better but still callus and nonviable tissue around the circular wound with some depth. He had an x-ray done last week of the left foot that was negative a culture showed multiple organisms. He did not get any antibiotics. He has custom-made shoes and apparently is getting another pair from [employer] that is in the process. I told him that we are not offloading the sufficiently for what ever reason. I do not think he is wearing his custom shoes at home although he says he is not walking that much 6/17 bilateral plantar foot in the setting of previous transmetatarsal amputations. He has been using silver alginate. He has his own custom made shoes which he is supposed to have it replaced in the next week or 2. We have been extensively debriding the callus around both wound sites and things look slightly better today 6/24; patient arrives in clinic today with her intake nurse pointing out odor and drainage. We have been using silver alginate. I was going to put a total contact cast on this leg today but in view of the odor and drainage will postpone that 7/1; intake nurse still points out odor and drainage. Last week I did a deep tissue culture of the right foot this grew a few methicillin resistant staph aureus, few group B strep  and a few Pseudomonas. No anaerobes were isolated. He will require a course of 2 different  antibiotics probably ciprofloxacin and doxycycline. As usual he has thick callus around the wound Electronic Signature(s) Signed: 12/16/2019 5:47:00 PM By: Linton Ham MD Entered By: Linton Ham on 12/16/2019 08:52:27 -------------------------------------------------------------------------------- Physical Exam Details Patient Name: Date of Service: [name] 12/16/2019 8:15 A M Medical Record Number: [id number] Patient Account Number: [id number] Date of Birth/Sex: Treating RN: 1941/05/01 (79 y.o. [name] Primary Care Provider: [name] Other Clinician: Referring Provider: Treating Provider/Extender: Gabriel Rainwater in Treatment: 21 Constitutional Sitting or standing Blood Pressure is within target range for patient.. Pulse regular and within target range for patient.Marland Kitchen Respirations regular, non-labored and within target range.. Temperature is normal and within the target range for the patient.Marland Kitchen Appears in no distress. Notes Wound exam Right foot this is certainly no better undermining areas laterally. Also a sliver in the middle that seems to have more depth this may have been where I took deep tissue last time. As usual copious amounts of callus thick skin around the margins. I used a #5 curette to remove this. Hemostasis with a pressure dressing Small wound on the left midfoot just below the met heads. Again copious amounts of thick callus skin and subcutaneous tissue around the wound margins I remove this. Hemostasis with direct pressure. Electronic Signature(s) Signed: 12/16/2019 5:47:00 PM By: Linton Ham MD Signed: 12/16/2019 5:47:00 PM By: Linton Ham MD Entered By: Linton Ham on 12/16/2019 08:54:11 -------------------------------------------------------------------------------- Physician Orders Details Patient Name: Date of Service: [name] 12/16/2019 8:15 A M Medical Record Number:  [id number] Patient Account Number: [id number] Date of Birth/Sex: Treating RN: March 01, 1941 (79 y.o. [name] Primary Care Provider: [name] Other Clinician: Referring Provider: Treating Provider/Extender: Gabriel Rainwater in Treatment: 21 Verbal / Phone Orders: No Diagnosis Coding ICD-10 Coding Code Description E11.621 Type 2 diabetes mellitus with foot ulcer L97.522 Non-pressure chronic ulcer of other part of left foot with fat layer exposed L97.512 Non-pressure chronic ulcer of other part of right foot with fat layer exposed I73.89 Other specified peripheral vascular diseases F17.218 Nicotine dependence, cigarettes, with other nicotine-induced disorders I10 Essential (primary) hypertension L84 Corns and callosities Follow-up Appointments Return Appointment in 1 week. Dressing Change Frequency Wound #21 Left,Plantar Foot Change dressing three times week. Wound #22 Right Metatarsal head first Change dressing three times week. Wound Cleansing Clean wound with Wound Cleanser - or normal saline Primary Wound Dressing Wound #21 Left,Plantar Foot Calcium Alginate with Silver Wound #22 Right Metatarsal head first lginate with Silver - ensure to pack into wound bed and any undermining Calcium A Secondary Dressing Wound #21 Left,Plantar Foot Foam - foam donut to offload Kerlix/Rolled Gauze Dry Gauze Wound #22 Right Metatarsal head first Foam - foam donut to offload Kerlix/Rolled Gauze Dry Gauze Edema Control Avoid standing for long periods of time Elevate legs to the level of the heart or above for 30 minutes daily and/or when sitting, a frequency of: - throughout the day. Off-Loading Other: - felt patient's personal shoes. patient to wear diabetic shoes. patient to limit about of time spent walking and standing. [facility] for wound care. - [facility] X-ray, right foot, complete  view - Non healing ulcer on plantar foot - (ICD10 L97.512 - Non-pressure chronic ulcer of other part of right foot with fat layer exposed) X-ray, left foot, complete view -  Non healing ulcer on plantar foot - (ICD10 L97.522 - Non-pressure chronic ulcer of other part of left foot with fat layer exposed) Patient Medications llergies: No Known Drug Allergies A Notifications Medication Indication Start End wound infection right 12/16/2019 doxycycline monohydrate foot DOSE oral 100 mg capsule - 1 capsule oral bid for 7 days right foot infection 12/16/2019 Cipro DOSE oral 500 mg tablet - 1 tablet oral bid for 7days Electronic Signature(s) Signed: 12/16/2019 8:57:44 AM By: Linton Ham MD Entered By: Linton Ham on 12/16/2019 08:57:43 Prescription 12/16/2019 -------------------------------------------------------------------------------- Blair Promise MD Patient Name: Provider: 12-23-1940 [id number] Date of Birth: NPI#[name] [id number] Sex: DEA #: [phone] Phone #: License #: [place] Patient Address: [address] [place], [place] [address] [place], [place] [address] [phone] Allergies Name Reaction Severity No Known Drug Allergies Provider's Orders X-ray, right foot, complete view - ICD10: L97.512 - Non healing ulcer on plantar foot Hand Signature: Date(s): Prescription 12/16/2019 Blair Promise MD Patient Name: Provider: Mar 08, 1941 [id number] Date of Birth: NPI#: [name] [id number] Sex: DEA #: [phone] Phone #: License #: [place] Patient Address: [address] [place], [place] [address] [place], [place] [address] [phone] Allergies Name Reaction Severity No Known Drug Allergies Provider's Orders X-ray, left foot, complete view - ICD10: G31.517 - Non healing ulcer  on plantar foot Hand Signature: Date(s): Electronic Signature(s) Signed: 12/16/2019 5:47:00 PM By: Linton Ham MD Entered By: Linton Ham on 12/16/2019 08:57:45 -------------------------------------------------------------------------------- Problem List Details Patient Name: Date of Service: [name] 12/16/2019 8:15 A M Medical Record Number: [id number] Patient Account Number: [id number] Date of Birth/Sex: Treating RN: August 02, 1940 (79 y.o. [name] Primary Care Provider: [name] Other Clinician: Referring Provider: Treating Provider/Extender: Gabriel Rainwater in Treatment: 21 Active Problems ICD-10 Encounter Code Description Active Date MDM Diagnosis E11.621 Type 2 diabetes mellitus with foot ulcer 07/21/2019 No Yes L97.522 Non-pressure chronic ulcer of other part of left foot with fat layer exposed 07/21/2019 No Yes L97.512 Non-pressure chronic ulcer of other part of right foot with fat layer exposed 07/21/2019 No Yes I73.89 Other specified peripheral vascular diseases 07/21/2019 No Yes L03.115 Cellulitis of right lower limb 12/16/2019 No Yes Inactive Problems ICD-10 Code Description Active Date Inactive Date L84 Corns and callosities 07/21/2019 07/21/2019 I10 Essential (primary) hypertension 07/21/2019 07/21/2019 F17.218 Nicotine dependence, cigarettes, with other nicotine-induced disorders 07/21/2019 07/21/2019 Resolved Problems Electronic Signature(s) Signed: 12/16/2019 5:47:00 PM By: Linton Ham MD Entered By: Linton Ham on 12/16/2019 08:50:03 -------------------------------------------------------------------------------- Progress Note Details Patient Name: Date of Service: [name] 12/16/2019 8:15 A M Medical Record Number: [id number] Patient Account Number: [id number] Date of Birth/Sex: Treating RN: 1941-01-29 (79 y.o. [name] Primary Care Provider: [name] Other Clinician: Referring  Provider: Treating Provider/Extender: Gabriel Rainwater in Treatment: 21 Subjective History of Present Illness (HPI) very pleasant 79 year old gentleman has type 2 diabetes and has been having an ulcer on his foot for several years. Been seeing as this time around since the end of October. Classified as a Wagner grade 2 because his previous x-rays did not show any problems. He has an MRI pending this Friday. discussing with him he says he has not been able to get his diabetic shoes yet. He continues to smoke his pipe and drinks moonshine and says he will not  give this up. I have tried to counsel him regarding this but he firmly says that he is happy to listen to me but he is not going to give up his habits. 08/17/14 -- patient has no fresh complaints but has recently come with his MRI which was done on 08/12/2014. 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 Readmission: 07/21/2019 upon evaluation today patient presents for initial inspection here in our clinic concerning issues that he has been having with his bilateral feet. He has open wounds that been present at least since the beginning of the year although he does not know an exact time. He is previously had amputations of all toes and the distal portion of his foot. 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 With that being said he does have a history of diabetes, peripheral vascular disease, nicotine dependence, and hypertension. He has not had any recent arterial or vascular studies he was noncompressible today I think he is going to require referral for arterial studies as well. 2/11; patient was readmitted to our clinic last week. He has bilateral plantar foot wounds in the setting of previous transmetatarsal amputations. He had his arterial studies done on 2/8; these were actually quite  good. He was noncompressible on both sides however his waveforms were triphasic. The thought was noncompressible vessels consistent with medial calcification but without significant stenosis in the lower extremities. X-rays were done on the left this showed extensive chronic and postoperative changes without evidence of osteomyelitis. There was no evidence of osteomyelitis on the right foot there was mild vascular calcification MRI was suggested on the left based on clinical correlation. The patient arrives in clinic with some odor and drainage from the left foot. Right foot measures smaller. He is really not offloading these areas although he says he does not walk much 2/25; the patient arrives with some odor and drainage from the left foot again. Post debridement I cultured this area. He has the area on the midfoot roughly the third metatarsal head and the first metatarsal head on the right. He is in surgical shoes bilateral transmet 3/4; culture from last week showed abundant group B strep and a few Proteus. We will put him on cephalexin starting today. Still silver alginate to the wounds 3/11; due to issues with pharmacy he still does not have the cephalexin that I prescribed a week ago. Apparently this was called into a different pharmacy on Friday but they have not delivered it to him. We have been using silver alginate on the wounds but because of lack of moisture change to silver collagen today. We are offloading as best we can in surgical shoes 3/18; he is taking the cephalexin that I prescribed 2 weeks ago. Using silver collagen. He has surgical shoes. Setting of bilateral previous transmetatarsal amputations. Wounds are mirror-image wounds medially. 3/25; mirror-image wounds in the setting of previous transmetatarsal amputation. The wounds are mirror image medial foot wounds. We have been using silver collagen. We are using surgical shoes offloaded with felt. He came in today asking about  his "$900" custom-made shoes although after talking to them it does not appear that he ever really wore these and they are more than a year old 4/8; mirror-image wounds on the medial part of the plantar feet in the setting of previous transmetatarsal amputations. We have been using silver collagen. I aggressively debrided these 2 weeks ago removing  callus subcutaneous tissue from around the wound margins. He did indeed bring echo he has custom-made shoes with transmetatarsal inserts. He has never worn these. He claims to be not spending a lot of time on his feet 4/15; mirror-image wounds on the plantar feet in the setting of previous transmetatarsal amputation. We have been using silver alginate. Culture that I did last time grew MRSA and Proteus. I have him on doxycycline. This was a deep tissue culture. The Proteus was not actively plated against doxycycline but was otherwise pansensitive I think the doxycycline would cover this. His wounds actually looks somewhat better. Rolled senescent edges still around the wound edges 4/29; mirror-image wounds on the plantar feet in the setting of previous bilateral transmetatarsal amputations we have been using silver alginate. Last time I saw him 2 weeks ago I gave him a course of doxycycline. Things seem to have cleaned up. This is in response to a deep tissue culture that showed MRSA and Proteus 5/6; wounds on the bilateral plantar feet in the setting of previous transmetatarsal amputations. We have been using silver alginate. Arrives in the clinic today with the same raised thick callus and subcutaneous tissue. He has home health changing the dressing. He brought up the fact that he has transportation through [facility] apparently he only has "12 visits". Otherwise he has the pay $40 for a cab ride there and back from his home which I guess [place] [address]. 11/11/19-Patient back with left transmet plantar surface much worse with extension of  devitalized tissue proximally from the original wound with dense callus rim, the right is about the same, apparently patient was wearing tennis shoes at home per home health, although patient swears that he has been using his offloading devices that he has with inserts. 6/8; I have not seen this patient in over a month however he was seen last week in my absence. He has mirror-image wounds over his bilateral transmit plantar surfaces. He has been using silver alginate. He comes in today with literally a mountain of callus around the 2 wounds on the left. The area on the right somewhat better but still callus and nonviable tissue around the circular wound with some depth. He had an x-ray done last week of the left foot that was negative a culture showed multiple organisms. He did not get any antibiotics. He has custom-made shoes and apparently is getting another pair from [employer] that is in the process. I told him that we are not offloading the sufficiently for what ever reason. I do not think he is wearing his custom shoes at home although he says he is not walking that much 6/17 bilateral plantar foot in the setting of previous transmetatarsal amputations. He has been using silver alginate. He has his own custom made shoes which he is supposed to have it replaced in the next week or 2. We have been extensively debriding the callus around both wound sites and things look slightly better today 6/24; patient arrives in clinic today with her intake nurse pointing out odor and drainage. We have been using silver alginate. I was going to put a total contact cast on this leg today but in view of the odor and drainage will postpone that 7/1; intake nurse still points out odor and drainage. Last week I did a deep tissue culture of the right foot this grew a few methicillin resistant staph aureus, few group B strep and a few Pseudomonas. No anaerobes were isolated. He  will require a course of 2 different  antibiotics probably ciprofloxacin and doxycycline. As usual he has thick callus around the wound Objective Constitutional Sitting or standing Blood Pressure is within target range for patient.. Pulse regular and within target range for patient.Marland Kitchen Respirations regular, non-labored and within target range.. Temperature is normal and within the target range for the patient.Marland Kitchen Appears in no distress. Vitals Time Taken: 8:30 AM, Height: 73 in, Weight: 180 lbs, BMI: 23.7, Temperature: 98.3 F, Pulse: 65 bpm, Respiratory Rate: 19 breaths/min, Blood Pressure: 114/73 mmHg. General Notes: patient stated he did not check CBG this morning General Notes: Wound exam ooRight foot this is certainly no better undermining areas laterally. Also a sliver in the middle that seems to have more depth this may have been where I took deep tissue last time. As usual copious amounts of callus thick skin around the margins. I used a #5 curette to remove this. Hemostasis with a pressure dressing ooSmall wound on the left midfoot just below the met heads. Again copious amounts of thick callus skin and subcutaneous tissue around the wound margins I remove this. Hemostasis with direct pressure. Integumentary (Hair, Skin) Wound #21 status is Open. Original cause of wound was Gradually Appeared. The wound is located on the [place]. The wound measures 0.4cm length x 0.4cm width x 0.7cm depth; 0.126cm^2 area and 0.088cm^3 volume. There is Fat Layer (Subcutaneous Tissue) Exposed exposed. There is no tunneling noted, however, there is undermining starting at 12:00 and ending at 12:00 with a maximum distance of 0.9cm. There is a medium amount of serosanguineous drainage noted. Foul odor after cleansing was noted. The wound margin is thickened. There is large (67-100%) pink granulation within the wound bed. There is no necrotic tissue within the wound bed. Wound #22 status is Open. Original cause of wound was Gradually  Appeared. The wound is located on the Right Metatarsal head first. The wound measures 1.1cm length x 1.2cm width x 0.8cm depth; 1.037cm^2 area and 0.829cm^3 volume. There is Fat Layer (Subcutaneous Tissue) Exposed exposed. There is no tunneling noted, however, there is undermining starting at 12:00 and ending at 12:00 with a maximum distance of 1.1cm. There is a medium amount of serosanguineous drainage noted. Foul odor after cleansing was noted. The wound margin is thickened. There is large (67-100%) pink granulation within the wound bed. There is no necrotic tissue within the wound bed. 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 There was a Excisional Skin/Subcutaneous Tissue Debridement with a total area of 0.16 sq cm performed by Ricard Dillon., MD. With the following instrument(s): Curette to remove Viable and Non-Viable tissue/material. Material removed includes Callus, Subcutaneous Tissue, and Skin: Epidermis. No specimens were taken. A time out was conducted at 08:41, prior to the start of the procedure. A Minimum amount of bleeding was controlled with Pressure. The procedure was tolerated well with a pain level of 0 throughout and a pain level of 0 following the procedure. Post Debridement Measurements: 0.4cm length x 0.4cm width x 0.7cm depth; 0.088cm^3 volume. Character of Wound/Ulcer Post Debridement is improved. Severity of  Tissue Post Debridement is: Fat layer exposed. Post procedure Diagnosis Wound #21: Same as  Pre-Procedure Wound #22 Pre-procedure diagnosis of Wound #22 is a Diabetic Wound/Ulcer of the Lower Extremity located on the Right Metatarsal head first .Severity of Tissue Pre Debridement is: Fat layer exposed. There was a Excisional Skin/Subcutaneous Tissue Debridement with a total area of 1.32 sq cm performed by Ricard Dillon., MD. With the following instrument(s): Curette to remove Viable and Non-Viable tissue/material. Material removed includes Callus, Subcutaneous Tissue, and Skin: Epidermis. No specimens were taken. A time out was conducted at 08:41, prior to the start of the procedure. A Minimum amount of bleeding was controlled with Pressure. The procedure was tolerated well with a pain level of 0 throughout and a pain level of 0 following the procedure. Post Debridement Measurements: 1.1cm length x 1.2cm width x 0.8cm depth; 0.829cm^3 volume. Character of Wound/Ulcer Post Debridement is improved. Severity of Tissue Post Debridement is: Fat layer exposed. Post procedure Diagnosis Wound #22: Same as Pre-Procedure Plan Follow-up Appointments: Return Appointment in 1 week. Dressing Change Frequency: Wound #21 Left,Plantar Foot: Change dressing three times week. Wound #22 Right Metatarsal head first: Change dressing three times week. Wound Cleansing: Clean wound with Wound Cleanser - or normal saline Primary Wound Dressing: Wound #21 Left,Plantar Foot: Calcium Alginate with Silver Wound #22 Right Metatarsal head first: Calcium Alginate with Silver - ensure to pack into wound bed and any undermining Secondary Dressing: Wound #21 Left,Plantar Foot: Foam - foam donut to offload Kerlix/Rolled Gauze Dry Gauze Wound #22 Right Metatarsal head first: Foam - foam donut to offload Kerlix/Rolled Gauze Dry Gauze Edema Control: Avoid standing for long periods of time Elevate legs to the level of the heart or above for 30 minutes daily and/or when sitting, a frequency of: - throughout  the day. Off-Loading: Other: - felt patient's personal shoes. patient to wear diabetic shoes. patient to limit about of time spent walking and standing. Home Health: [facility] for wound care. - [facility] ordered were: X-ray, right foot, complete view - Non healing ulcer on plantar foot, X-ray, left foot, complete view - Non healing ulcer on plantar foot The following medication(s) was prescribed: doxycycline monohydrate oral 100 mg capsule 1 capsule oral bid for 7 days for wound infection right foot starting 12/16/2019 Cipro oral 500 mg tablet 1 tablet oral bid for 7days for right foot infection starting 12/16/2019 1. Doxycycline and Cipro both for 7 days 2. Continue silver alginate 3. Plain x-rays of both feet most concerning area is on the right where the wound is over bone 4. I I do not believe he offloads this adequately. Copious amounts of callus and thick skin around both wounds I told him I am going to consider putting a total contact cast starting on the right. Electronic Signature(s) Signed: 12/16/2019 5:47:00 PM By: Linton Ham MD Entered By: Linton Ham on 12/16/2019 08:58:34 -------------------------------------------------------------------------------- SuperBill Details Patient Name: Date of Service: [name] 12/16/2019 Medical Record Number: [id number] Patient Account Number: [id number] Date of Birth/Sex: Treating RN: 12/17/1940 (79 y.o. [name] Primary Care Provider: [name] Other Clinician: Referring Provider: Treating Provider/Extender: Gabriel Rainwater in Treatment: 21 Diagnosis Coding ICD-10 Codes Code Description E11.621 Type 2 diabetes mellitus with foot ulcer L97.522 Non-pressure chronic ulcer of other part of left foot with fat layer exposed L97.512 Non-pressure chronic ulcer of other part of right foot with fat layer exposed I73.89 Other specified peripheral  vascular diseases L03.115 Cellulitis of right lower limb Facility Procedures The patient participates with Medicare or their insurance follows the Medicare Facility Guidelines: CPT4 Code Description Modifier Quantity [id number] [place] TISSUE 20 SQ CM/< 1 ICD-10 Diagnosis Description E11.621 Type 2 diabetes mellitus with  foot ulcer L97.522 Non-pressure chronic ulcer of other part of left foot with fat layer exposed L97.512 Non-pressure chronic ulcer of other part of right foot with fat layer exposed Physician Procedures : CPT4 Code Description Modifier [id number] - WC PHYS SUBQ TISS 20 SQ CM ICD-10 Diagnosis Description E11.621 Type 2 diabetes mellitus with foot ulcer L97.522 Non-pressure chronic ulcer of other part of left foot with fat layer exposed L97.512  Non-pressure chronic ulcer of other part of right foot with fat layer exposed Quantity: 1 Electronic Signature(s) Signed: 12/16/2019 5:47:00 PM By: Linton Ham MD Entered By: Linton Ham on 12/16/2019 08:58:51

## 2019-12-23 ENCOUNTER — Encounter (HOSPITAL_BASED_OUTPATIENT_CLINIC_OR_DEPARTMENT_OTHER): Payer: Medicare Other | Admitting: Internal Medicine

## 2019-12-30 ENCOUNTER — Encounter (HOSPITAL_BASED_OUTPATIENT_CLINIC_OR_DEPARTMENT_OTHER): Payer: Medicare Other | Admitting: Internal Medicine

## 2019-12-30 DIAGNOSIS — E11621 Type 2 diabetes mellitus with foot ulcer: Secondary | ICD-10-CM | POA: Diagnosis not present

## 2019-12-31 ENCOUNTER — Other Ambulatory Visit: Payer: Self-pay | Admitting: Internal Medicine

## 2019-12-31 ENCOUNTER — Other Ambulatory Visit (HOSPITAL_COMMUNITY): Payer: Self-pay | Admitting: Internal Medicine

## 2019-12-31 DIAGNOSIS — L97512 Non-pressure chronic ulcer of other part of right foot with fat layer exposed: Secondary | ICD-10-CM

## 2020-01-03 NOTE — Progress Notes (Signed)
LESLY, JOSLYN ([id number]) Visit Report for 12/30/2019 Arrival Information Details Patient Name: Date of Service: [name], [place] 12/30/2019 2:30 PM Medical Record Number: [id number] Patient Account Number: [id number] Date of Birth/Sex: Treating RN: 12/11/1940 (79 y.o. [name] Primary Care Jailen Lung: [name] Other Clinician: Referring Ziza Hastings: Treating Tamira Ryland/Extender: Gabriel Rainwater in Treatment: 23 Visit Information History Since Last Visit Added or deleted any medications: No Patient Arrived: Cane Any new allergies or adverse reactions: No Arrival Time: 14:57 Had a fall or experienced change in No Accompanied By: self activities of daily living that may affect Transfer Assistance: None risk of falls: Patient Identification Verified: Yes Signs or symptoms of abuse/neglect since last visito No Secondary Verification Process Completed: Yes Hospitalized since last visit: No Patient Requires Transmission-Based Precautions: No Implantable device outside of the clinic excluding No Patient Has Alerts: Yes cellular tissue based products placed in the center Patient Alerts: R ABI non compressible since last visit: L ABI non compressible Has Dressing in Place as Prescribed: Yes Pain Present Now: No Electronic Signature(s) Signed: 12/30/2019 5:52:12 PM By: Kela Millin Entered By: Kela Millin on 12/30/2019 14:57:59 -------------------------------------------------------------------------------- Encounter Discharge Information Details Patient Name: Date of Service: [name], [name] 12/30/2019 2:30 PM Medical Record Number: [id number] Patient Account Number: [id number] Date of Birth/Sex: Treating RN: 08-30-1940 (79 y.o. [name] Primary Care Manveer Gomes: [name] Other Clinician: Referring Twanda Stakes: Treating Jasmeen Fritsch/Extender: Gabriel Rainwater in Treatment:  23 Encounter Discharge Information Items Post Procedure Vitals Discharge Condition: Stable Temperature (F): 98 Ambulatory Status: Cane Pulse (bpm): 69 Discharge Destination: Home Respiratory Rate (breaths/min): 18 Transportation: Private Auto Blood Pressure (mmHg): 155/91 Accompanied By: self Schedule Follow-up Appointment: Yes Clinical Summary of Care: Patient Declined Electronic Signature(s) Signed: 12/30/2019 5:47:12 PM By: Baruch Gouty RN, BSN Entered By: Baruch Gouty on 12/30/2019 17:42:29 -------------------------------------------------------------------------------- Lower Extremity Assessment Details Patient Name: Date of Service: [name], [name] 12/30/2019 2:30 PM Medical Record Number: [id number] Patient Account Number: [id number] Date of Birth/Sex: Treating RN: February 19, 1941 (79 y.o. [name] Primary Care Hanford Lust: [name] Other Clinician: Referring Nawal Burling: Treating Trinady Milewski/Extender: Marlow Baars Weeks in Treatment: 23 Edema Assessment Assessed: [name]: No] [Right: No] Edema: [Left: Yes] [Right: Yes] Calf Left: Right: Point of Measurement: 32 cm From Medial Instep 31 cm 34 cm Ankle Left: Right: Point of Measurement: 10 cm From Medial Instep 22.5 cm 25 cm Vascular Assessment Pulses: Dorsalis Pedis Palpable: [Left:Yes] [Right:Yes] Electronic Signature(s) Signed: 12/30/2019 5:52:12 PM By: Kela Millin Entered By: Kela Millin on 12/30/2019 14:58:46 -------------------------------------------------------------------------------- Multi Wound Chart Details Patient Name: Date of Service: [name], [name] 12/30/2019 2:30 PM Medical Record Number: [id number] Patient Account Number: [id number] Date of Birth/Sex: Treating RN: [birth year] (79 y.o. [name] Primary Care Federick Levene: [name] Other Clinician: Referring Garrick Midgley: Treating Tyrese Ficek/Extender: Gabriel Rainwater in Treatment: 23 Vital Signs Height(in): 73 Pulse(bpm): 71 Weight(lbs): 180 Blood Pressure(mmHg): 155/91 Body Mass Index(BMI): 24 Temperature(F): 98 Respiratory Rate(breaths/min): 19 Photos: [21:No Photos Left, Plantar Foot] [22:No Photos Right Metatarsal head first] [N/A:N/A N/A] Wound Location: [21:Gradually Appeared] [22:Gradually Appeared] [N/A:N/A] Wounding Event: [21:Diabetic Wound/Ulcer of the Lower] [22:Diabetic Wound/Ulcer of the Lower] [N/A:N/A] Primary Etiology: [21:Extremity Cataracts, Glaucoma, Hypertension,] [22:Extremity Cataracts, Glaucoma, Hypertension,] [N/A:N/A] Comorbid History: [21:Peripheral Arterial Disease, Type II Diabetes, Osteomyelitis, Neuropathy 06/17/2018] [22:Peripheral Arterial Disease, Type II Diabetes, Osteomyelitis, Neuropathy 06/17/2018] [N/A:N/A] Date Acquired: [21:23] [22:23] [N/A:N/A] Weeks of Treatment: [21:Open] [22:Open] [N/A:N/A] Wound  Status: [21:0.2x0.2x0.6] [22:1.4x1.4x0.7] [N/A:N/A] Measurements L x W x D (cm) [21:0.031] [22:1.539] [N/A:N/A] A (cm) : rea [71:6.967] [22:1.078] [N/A:N/A] Volume (cm) : [21:98.70%] [22:-145.10%] [N/A:N/A] % Reduction in A rea: [21:98.40%] [22:-473.40%] [N/A:N/A] % Reduction in Volume: [21:12] [22:1] Starting Position 1 (o'clock): [21:12] [22:9] Ending Position 1 (o'clock): [21:0.5] [22:1.1] Maximum Distance 1 (cm): [21:Yes] [22:Yes] [N/A:N/A] Undermining: [21:Grade 2] [22:Grade 2] [N/A:N/A] Classification: [21:Medium] [22:Medium] [N/A:N/A] Exudate A mount: [21:Serosanguineous] [22:Serous] [N/A:N/A] Exudate Type: [21:red, brown] [22:amber] [N/A:N/A] Exudate Color: [21:No] [22:Yes] [N/A:N/A] Foul Odor A Cleansing: [21:fter N/A] [22:No] [N/A:N/A] Odor A nticipated Due to Product Use: [21:Thickened] [22:Thickened] [N/A:N/A] Wound Margin: [21:Large (67-100%)] [22:Large (67-100%)] [N/A:N/A] Granulation A mount: [21:Pink] [22:Pink] [N/A:N/A] Granulation Quality: [21:None  Present (0%)] [22:None Present (0%)] [N/A:N/A] Necrotic A mount: [21:Fat Layer (Subcutaneous Tissue)] [22:Fat Layer (Subcutaneous Tissue)] [N/A:N/A] Exposed Structures: [21:Exposed: Yes Fascia: No Tendon: No Muscle: No Joint: No Bone: No Small (1-33%)] [22:Exposed: Yes Fascia: No Tendon: No Muscle: No Joint: No Bone: No Small (1-33%)] [N/A:N/A] Epithelialization: [21:Debridement - Selective/Open Wound] [22:Debridement - Excisional] [N/A:N/A] Debridement: Pre-procedure Verification/Time Out 15:22 [22:15:22] [N/A:N/A] Taken: [21:Callus] [22:Callus, Subcutaneous] [N/A:N/A] Tissue Debrided: [21:Non-Viable Tissue] [22:Skin/Subcutaneous Tissue] [N/A:N/A] Level: [21:1] [22:1.96] [N/A:N/A] Debridement A (sq cm): [21:rea Blade, Forceps] [22:Blade, Forceps] [N/A:N/A] Instrument: [21:Minimum] [22:Moderate] [N/A:N/A] Bleeding: [21:Pressure] [22:Silver Nitrate] [N/A:N/A] Hemostasis A chieved: [21:0] [22:0] [N/A:N/A] Procedural Pain: [21:0] [22:0] [N/A:N/A] Post Procedural Pain: [21:Procedure was tolerated well] [22:Procedure was tolerated well] [N/A:N/A] Debridement Treatment Response: [21:0.2x0.2x0.6] [22:1.4x1.4x0.7] [N/A:N/A] Post Debridement Measurements L x W x D (cm) [89:3.810] [22:1.078] [N/A:N/A] Post Debridement Volume: (cm) [21:Debridement] [22:Debridement] [N/A:N/A] Treatment Notes Electronic Signature(s) Signed: 12/30/2019 5:51:19 PM By: Linton Ham MD Signed: 01/03/2020 4:12:59 PM By: Levan Hurst RN, BSN Entered By: Linton Ham on 12/30/2019 15:28:04 -------------------------------------------------------------------------------- Multi-Disciplinary Care Plan Details Patient Name: Date of Service: [name], [name] 12/30/2019 2:30 PM Medical Record Number: [id number] Patient Account Number: [id number] Date of Birth/Sex: Treating RN: [birth year] (79 y.o. [name] Primary Care Arrayah Connors: [name] Other Clinician: Referring Aydden Cumpian: Treating  Braeden Dolinski/Extender: Gabriel Rainwater in Treatment: 23 Active Inactive Wound/Skin Impairment Nursing Diagnoses: Impaired tissue integrity Knowledge deficit related to smoking impact on wound healing Knowledge deficit related to ulceration/compromised skin integrity Goals: Patient will demonstrate a reduced rate of smoking or cessation of smoking Date Initiated: 07/21/2019 Date Inactivated: 11/23/2019 Target Resolution Date: 11/12/2019 Goal Status: Met Patient/caregiver will verbalize understanding of skin care regimen Date Initiated: 07/21/2019 Target Resolution Date: 01/14/2020 Goal Status: Active Ulcer/skin breakdown will have a volume reduction of 30% by week 4 Date Initiated: 07/21/2019 Date Inactivated: 09/23/2019 Target Resolution Date: 09/17/2019 Unmet Reason: see wound Goal Status: Unmet measurements. Interventions: Assess patient/caregiver ability to obtain necessary supplies Assess patient/caregiver ability to perform ulcer/skin care regimen upon admission and as needed Assess ulceration(s) every visit Provide education on smoking Provide education on ulcer and skin care Treatment Activities: Skin care regimen initiated : 07/21/2019 Topical wound management initiated : 07/21/2019 Notes: Electronic Signature(s) Signed: 01/03/2020 4:12:59 PM By: Levan Hurst RN, BSN Entered By: Levan Hurst on 12/30/2019 17:46:13 -------------------------------------------------------------------------------- Pain Assessment Details Patient Name: Date of Service: [name], [name] 12/30/2019 2:30 PM Medical Record Number: [id number] Patient Account Number: [id number] Date of Birth/Sex: Treating RN: Jul 29, 1940 (79 y.o. [name] Primary Care Makaelyn Aponte: [name] Other Clinician: Referring Moraima Burd: Treating Normal Recinos/Extender: Gabriel Rainwater in Treatment: 23 Active Problems Location of Pain Severity and  Description of Pain Patient Has Paino No Site Locations Pain Management and  Medication Current Pain Management: Electronic Signature(s) Signed: 12/30/2019 5:52:12 PM By: Kela Millin Entered By: Kela Millin on 12/30/2019 14:58:27 -------------------------------------------------------------------------------- Patient/Caregiver Education Details Patient Name: Date of Service: [name], [name] 7/15/2021andnbsp2:30 PM Medical Record Number: [id number] Patient Account Number: [id number] Date of Birth/Gender: Treating RN: 11/14/1940 (79 y.o. [name] Primary Care Physician: [name] Other Clinician: Referring Physician: Treating Physician/Extender: Gabriel Rainwater in Treatment: 23 Education Assessment Education Provided To: Patient Education Topics Provided Wound/Skin Impairment: Methods: Explain/Verbal Responses: State content correctly [employer]) Signed: 01/03/2020 4:12:59 PM By: Levan Hurst RN, BSN Entered By: Levan Hurst on 12/30/2019 17:46:25 -------------------------------------------------------------------------------- Wound Assessment Details Patient Name: Date of Service: [name], [name] 12/30/2019 2:30 PM Medical Record Number: [id number] Patient Account Number: [id number] Date of Birth/Sex: Treating RN: [birth year] (79 y.o. [name] Primary Care Teodoro Jeffreys: [name] Other Clinician: Referring Hetty Linhart: Treating Emily Forse/Extender: Gabriel Rainwater in Treatment: 23 Wound Status Wound Number: 21 Primary Diabetic Wound/Ulcer of the Lower Extremity Etiology: Wound Location: Left, Plantar Foot Wound Open Wounding Event: Gradually Appeared Status: Date Acquired: 06/17/2018 Comorbid Cataracts, Glaucoma, Hypertension, Peripheral Arterial Disease, Weeks Of Treatment: 23 History: Type II Diabetes, Osteomyelitis, Neuropathy Clustered  Wound: No Photos Photo Uploaded By: Mikeal Hawthorne on 12/30/2019 16:37:17 Wound Measurements Length: (cm) 0.2 Width: (cm) 0.2 Depth: (cm) 0.6 Area: (cm) 0.031 Volume: (cm) 0.019 % Reduction in Area: 98.7% % Reduction in Volume: 98.4% Epithelialization: Small (1-33%) Tunneling: No Undermining: Yes Starting Position (o'clock): 12 Ending Position (o'clock): 12 Maximum Distance: (cm) 0.5 Wound Description Classification: Grade 2 Wound Margin: Thickened Exudate Amount: Medium Exudate Type: Serosanguineous Exudate Color: red, brown Foul Odor After Cleansing: No Slough/Fibrino No Wound Bed Granulation Amount: Large (67-100%) Exposed Structure Granulation Quality: Pink Fascia Exposed: No Necrotic Amount: None Present (0%) Fat Layer (Subcutaneous Tissue) Exposed: Yes Tendon Exposed: No Muscle Exposed: No Joint Exposed: No Bone Exposed: No Treatment Notes Wound #21 (Left, Plantar Foot) 2. Periwound Care Moisturizing lotion 3. Primary Dressing Applied Calcium Alginate Ag 4. Secondary Dressing Dry Gauze Roll Gauze Foam 5. Secured With Other (specify in notes) Notes netting Electronic Signature(s) Signed: 12/30/2019 5:52:12 PM By: Kela Millin Entered By: Kela Millin on 12/30/2019 14:59:47 -------------------------------------------------------------------------------- Wound Assessment Details Patient Name: Date of Service: [name], [name] 12/30/2019 2:30 PM Medical Record Number: [id number] Patient Account Number: [id number] Date of Birth/Sex: Treating RN: [birth year] (79 y.o. [name] Primary Care Treniece Holsclaw: [name] Other Clinician: Referring Nylan Nakatani: Treating Liboria Putnam/Extender: Gabriel Rainwater in Treatment: 23 Wound Status Wound Number: 22 Primary Diabetic Wound/Ulcer of the Lower Extremity Etiology: Wound Location: Right Metatarsal head first Wound Open Wounding Event: Gradually  Appeared Status: Date Acquired: 06/17/2018 Comorbid Cataracts, Glaucoma, Hypertension, Peripheral Arterial Disease, Weeks Of Treatment: 23 History: Type II Diabetes, Osteomyelitis, Neuropathy Clustered Wound: No Photos Photo Uploaded By: Mikeal Hawthorne on 12/30/2019 16:37:18 Wound Measurements Length: (cm) 1.4 Width: (cm) 1.4 Depth: (cm) 0.7 Area: (cm) 1.539 Volume: (cm) 1.078 % Reduction in Area: -145.1% % Reduction in Volume: -473.4% Epithelialization: Small (1-33%) Tunneling: No Undermining: Yes Starting Position (o'clock): 1 Ending Position (o'clock): 9 Maximum Distance: (cm) 1.1 Wound Description Classification: Grade 2 Wound Margin: Thickened Exudate Amount: Medium Exudate Type: Serous Exudate Color: amber Foul Odor After Cleansing: Yes Due to Product Use: No Slough/Fibrino No Wound Bed Granulation Amount: Large (67-100%) Exposed Structure Granulation Quality: Pink Fascia Exposed: No Necrotic Amount: None Present (0%) Fat Layer (Subcutaneous Tissue) Exposed: Yes Tendon  Exposed: No Muscle Exposed: No Joint Exposed: No Bone Exposed: No Treatment Notes Wound #22 (Right Metatarsal head first) 2. Periwound Care Moisturizing lotion 3. Primary Dressing Applied Calcium Alginate Ag 4. Secondary Dressing Dry Gauze Roll Gauze Foam 5. Secured With Other (specify in notes) Notes netting Electronic Signature(s) Signed: 12/30/2019 5:52:12 PM By: Kela Millin Entered By: Kela Millin on 12/30/2019 15:00:28 -------------------------------------------------------------------------------- Vitals Details Patient Name: Date of Service: [name], [name] 12/30/2019 2:30 PM Medical Record Number: [id number] Patient Account Number: [id number] Date of Birth/Sex: Treating RN: 1940/11/18 (79 y.o. [name] Primary Care Shonte Soderlund: [name] Other Clinician: Referring Nalla Purdy: Treating Leona Pressly/Extender: Gabriel Rainwater in Treatment: 23 Vital Signs Time Taken: 14:45 Temperature (F): 98 Height (in): 73 Pulse (bpm): 69 Weight (lbs): 180 Respiratory Rate (breaths/min): 19 Body Mass Index (BMI): 23.7 Blood Pressure (mmHg): 155/91 Reference Range: 80 - 120 mg / dl Electronic Signature(s) Signed: 12/30/2019 5:52:12 PM By: Kela Millin Entered By: Kela Millin on 12/30/2019 14:58:18

## 2020-01-03 NOTE — Progress Notes (Signed)
Casey, [name] ([id number]) Visit Report for 12/30/2019 Debridement Details Patient Name: Date of Service: [name], [place] 12/30/2019 2:30 PM Medical Record Number: [id number] Patient Account Number: [id number] Date of Birth/Sex: Treating RN: 07-10-1940 (79 y.o. [name] Primary Care Provider: [name] Other Clinician: Referring Provider: Treating Provider/Extender: Gabriel Rainwater in Treatment: 23 Debridement Performed for Assessment: Wound #21 [place] Performed By: Physician Ricard Dillon., MD Debridement Type: Debridement Severity of Tissue Pre Debridement: Fat layer exposed Level of Consciousness (Pre-procedure): Awake and Alert Pre-procedure Verification/Time Out Yes - 15:22 Taken: Start Time: 15:22 T Area Debrided (L x W): otal 1 (cm) x 1 (cm) = 1 (cm) Tissue and other material debrided: Non-Viable, Callus Level: Non-Viable Tissue Debridement Description: Selective/Open Wound Instrument: Blade, Forceps Bleeding: Minimum Hemostasis Achieved: Pressure End Time: 15:24 Procedural Pain: 0 Post Procedural Pain: 0 Response to Treatment: Procedure was tolerated well Level of Consciousness (Post- Awake and Alert procedure): Post Debridement Measurements of Total Wound Length: (cm) 0.2 Width: (cm) 0.2 Depth: (cm) 0.6 Volume: (cm) 0.019 Character of Wound/Ulcer Post Debridement: Improved Severity of Tissue Post Debridement: Fat layer exposed Post Procedure Diagnosis Same as Pre-procedure Electronic Signature(s) Signed: 12/30/2019 5:51:19 PM By: Linton Ham MD Signed: 01/03/2020 4:12:59 PM By: Levan Hurst RN, BSN Entered By: Linton Ham on 12/30/2019 15:28:20 -------------------------------------------------------------------------------- Debridement Details Patient Name: Date of Service: [name], [name] 12/30/2019 2:30 PM Medical Record Number: [id number] Patient Account Number: [id number] Date of  Birth/Sex: Treating RN: February 20, 1941 (79 y.o. [name] Primary Care Provider: [name] Other Clinician: Referring Provider: Treating Provider/Extender: Gabriel Rainwater in Treatment: 23 Debridement Performed for Assessment: Wound #22 Right Metatarsal head first Performed By: Physician Ricard Dillon., MD Debridement Type: Debridement Severity of Tissue Pre Debridement: Fat layer exposed Level of Consciousness (Pre-procedure): Awake and Alert Pre-procedure Verification/Time Out Yes - 15:22 Taken: Start Time: 15:22 T Area Debrided (L x W): otal 1.4 (cm) x 1.4 (cm) = 1.96 (cm) Tissue and other material debrided: Non-Viable, Callus, Subcutaneous Level: Skin/Subcutaneous Tissue Debridement Description: Excisional Instrument: Blade, Forceps Bleeding: Moderate Hemostasis Achieved: Silver Nitrate End Time: 15:24 Procedural Pain: 0 Post Procedural Pain: 0 Response to Treatment: Procedure was tolerated well Level of Consciousness (Post- Awake and Alert procedure): Post Debridement Measurements of Total Wound Length: (cm) 1.4 Width: (cm) 1.4 Depth: (cm) 0.7 Volume: (cm) 1.078 Character of Wound/Ulcer Post Debridement: Improved Severity of Tissue Post Debridement: Fat layer exposed Post Procedure Diagnosis Same as Pre-procedure Electronic Signature(s) Signed: 12/30/2019 5:51:19 PM By: Linton Ham MD Signed: 01/03/2020 4:12:59 PM By: Levan Hurst RN, BSN Entered By: Linton Ham on 12/30/2019 15:28:56 -------------------------------------------------------------------------------- HPI Details Patient Name: Date of Service: [name], [name] 12/30/2019 2:30 PM Medical Record Number: [id number] Patient Account Number: [id number] Date of Birth/Sex: Treating RN: [birth year] (79 y.o. [name] Primary Care Provider: [name] Other Clinician: Referring Provider: Treating Provider/Extender: Gabriel Rainwater in Treatment: 23 History of Present Illness HPI Description: very pleasant 79 year old gentleman has type 2 diabetes and has been having an ulcer on his foot for several years. Been seeing as this time around since the end of October. Classified as a Wagner grade 2 because his previous x-rays did not show any problems. He has an MRI pending this Friday. discussing with him he says he has not been able to get his diabetic shoes yet. He continues to smoke his pipe and drinks moonshine and says  he will not give this up. I have tried to counsel him regarding this but he firmly says that he is happy to listen to me but he is not going to give up his habits. 08/17/14 -- patient has no fresh complaints but has recently come with his MRI which was done on 08/12/2014. 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 Readmission: 07/21/2019 upon evaluation today patient presents for initial inspection here in our clinic concerning issues that he has been having with his bilateral feet. He has open wounds that been present at least since the beginning of the year although he does not know an exact time. He is previously had amputations of all toes and the distal portion of his foot. 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 With that being said he does have a history of diabetes, peripheral vascular disease, nicotine dependence, and hypertension. He has not had any recent arterial or vascular studies he was noncompressible today I think he is going to require referral for arterial studies as well. 2/11; patient was readmitted to our clinic last week. He has bilateral plantar foot wounds in the setting of previous transmetatarsal amputations. He had his arterial studies done on 2/8; these were actually quite good. He was noncompressible on both sides however  his waveforms were triphasic. The thought was noncompressible vessels consistent with medial calcification but without significant stenosis in the lower extremities. X-rays were done on the left this showed extensive chronic and postoperative changes without evidence of osteomyelitis. There was no evidence of osteomyelitis on the right foot there was mild vascular calcification MRI was suggested on the left based on clinical correlation. The patient arrives in clinic with some odor and drainage from the left foot. Right foot measures smaller. He is really not offloading these areas although he says he does not walk much 2/25; the patient arrives with some odor and drainage from the left foot again. Post debridement I cultured this area. He has the area on the midfoot roughly the third metatarsal head and the first metatarsal head on the right. He is in surgical shoes bilateral transmet 3/4; culture from last week showed abundant group B strep and a few Proteus. We will put him on cephalexin starting today. Still silver alginate to the wounds 3/11; due to issues with pharmacy he still does not have the cephalexin that I prescribed a week ago. Apparently this was called into a different pharmacy on Friday but they have not delivered it to him. We have been using silver alginate on the wounds but because of lack of moisture change to silver collagen today. We are offloading as best we can in surgical shoes 3/18; he is taking the cephalexin that I prescribed 2 weeks ago. Using silver collagen. He has surgical shoes. Setting of bilateral previous transmetatarsal amputations. Wounds are mirror-image wounds medially. 3/25; mirror-image wounds in the setting of previous transmetatarsal amputation. The wounds are mirror image medial foot wounds. We have been using silver collagen. We are using surgical shoes offloaded with felt. He came in today asking about his "$900" custom-made shoes although after talking  to them it does not appear that he ever really wore these and they are more than a year old 4/8; mirror-image wounds on the medial part of the plantar feet in the setting of previous transmetatarsal amputations. We have been using silver collagen. I aggressively debrided these 2  weeks ago removing callus subcutaneous tissue from around the wound margins. He did indeed bring echo he has custom-made shoes with transmetatarsal inserts. He has never worn these. He claims to be not spending a lot of time on his feet 4/15; mirror-image wounds on the plantar feet in the setting of previous transmetatarsal amputation. We have been using silver alginate. Culture that I did last time grew MRSA and Proteus. I have him on doxycycline. This was a deep tissue culture. The Proteus was not actively plated against doxycycline but was otherwise pansensitive I think the doxycycline would cover this. His wounds actually looks somewhat better. Rolled senescent edges still around the wound edges 4/29; mirror-image wounds on the plantar feet in the setting of previous bilateral transmetatarsal amputations we have been using silver alginate. Last time I saw him 2 weeks ago I gave him a course of doxycycline. Things seem to have cleaned up. This is in response to a deep tissue culture that showed MRSA and Proteus 5/6; wounds on the bilateral plantar feet in the setting of previous transmetatarsal amputations. We have been using silver alginate. Arrives in the clinic today with the same raised thick callus and subcutaneous tissue. He has home health changing the dressing. He brought up the fact that he has transportation through [facility] apparently he only has "12 visits". Otherwise he has the pay $40 for a cab ride there and back from his home which I guess [place] [address]. 11/11/19-Patient back with left transmet plantar surface much worse with extension of devitalized tissue proximally from the original wound  with dense callus rim, the right is about the same, apparently patient was wearing tennis shoes at home per home health, although patient swears that he has been using his offloading devices that he has with inserts. 6/8; I have not seen this patient in over a month however he was seen last week in my absence. He has mirror-image wounds over his bilateral transmit plantar surfaces. He has been using silver alginate. He comes in today with literally a mountain of callus around the 2 wounds on the left. The area on the right somewhat better but still callus and nonviable tissue around the circular wound with some depth. He had an x-ray done last week of the left foot that was negative a culture showed multiple organisms. He did not get any antibiotics. He has custom-made shoes and apparently is getting another pair from [employer] that is in the process. I told him that we are not offloading the sufficiently for what ever reason. I do not think he is wearing his custom shoes at home although he says he is not walking that much 6/17 bilateral plantar foot in the setting of previous transmetatarsal amputations. He has been using silver alginate. He has his own custom made shoes which he is supposed to have it replaced in the next week or 2. We have been extensively debriding the callus around both wound sites and things look slightly better today 6/24; patient arrives in clinic today with her intake nurse pointing out odor and drainage. We have been using silver alginate. I was going to put a total contact cast on this leg today but in view of the odor and drainage will postpone that 7/1; intake nurse still points out odor and drainage. Last week I did a deep tissue culture of the right foot this grew a few methicillin resistant staph aureus, few group B strep and a few Pseudomonas. No anaerobes  were isolated. He will require a course of 2 different antibiotics probably ciprofloxacin and doxycycline. As  usual he has thick callus around the wound 7/15; copious amounts of callus. X-rays I did last time showed chronic changes versus possible osteomyelitis at the plantar aspect of the proximal metatarsal. We will therefore go ahead with the MRI. 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 We have been using silver alginate. Electronic Signature(s) Signed: 12/30/2019 5:51:19 PM By: Linton Ham MD Entered By: Linton Ham on 12/30/2019 15:30:21 -------------------------------------------------------------------------------- Physical Exam Details Patient Name: Date of Service: [name], [name] 12/30/2019 2:30 PM Medical Record Number: [id number] Patient Account Number: [id number] Date of Birth/Sex: Treating RN: Oct 25, 1940 (79 y.o. [name] Primary Care Provider: [name] Other Clinician: Referring Provider: Treating Provider/Extender: Gabriel Rainwater in Treatment: 23 Constitutional Patient is hypertensive.. Pulse regular and within target range for patient.Marland Kitchen Respirations regular, non-labored and within target range.. Temperature is normal and within the target range for the patient.Marland Kitchen Appears in no distress. Notes Wound exam Absolutely neither 1 of these is any better. Thick callus around the circumference macerated tissue on the right. Her intake nurse reported an odor here. There is no purulent drainage the area on the right does not go to bone Electronic Signature(s) Signed: 12/30/2019 5:51:19 PM By: Linton Ham MD Signed: 12/30/2019 5:51:19 PM By: Linton Ham MD Entered By: Linton Ham on 12/30/2019 15:31:19 -------------------------------------------------------------------------------- Physician Orders Details Patient Name: Date of Service: [name], [name] 12/30/2019 2:30 PM Medical Record Number: [id number] Patient Account Number:  [id number] Date of Birth/Sex: Treating RN: [birth year] (79 y.o. [name] Primary Care Provider: [name] Other Clinician: Referring Provider: Treating Provider/Extender: Gabriel Rainwater in Treatment: 17 Verbal / Phone Orders: No Diagnosis Coding ICD-10 Coding Code Description E11.621 Type 2 diabetes mellitus with foot ulcer L97.522 Non-pressure chronic ulcer of other part of left foot with fat layer exposed L97.512 Non-pressure chronic ulcer of other part of right foot with fat layer exposed I73.89 Other specified peripheral vascular diseases L03.115 Cellulitis of right lower limb Follow-up Appointments Return Appointment in 1 week. Dressing Change Frequency Wound #21 Left,Plantar Foot Change dressing three times week. Wound #22 Right Metatarsal head first Change dressing three times week. Wound Cleansing Clean wound with Wound Cleanser - or normal saline Primary Wound Dressing Wound #21 Left,Plantar Foot Calcium Alginate with Silver Wound #22 Right Metatarsal head first lginate with Silver - ensure to pack into wound bed and any undermining Calcium A Secondary Dressing Wound #21 Left,Plantar Foot Foam - foam donut to offload Kerlix/Rolled Gauze Dry Gauze Wound #22 Right Metatarsal head first Foam - foam donut to offload Kerlix/Rolled Gauze Dry Gauze Edema Control Avoid standing for long periods of time Elevate legs to the level of the heart or above for 30 minutes daily and/or when sitting, a frequency of: - throughout the day. Off-Loading Other: - felt patient's personal shoes. patient to wear diabetic shoes. patient to limit about of time spent walking and standing. [place] [facility] for wound care. - [facility] MRI, right foot with and without contrast - Non healing ulcer right plantar foot - (ICD10 L97.512 - Non-pressure chronic ulcer of other part of right foot with fat  layer exposed) Electronic Signature(s) Signed: 12/30/2019 5:51:19 PM By: Linton Ham MD Signed: 01/03/2020 4:12:59 PM By: Levan Hurst RN, BSN Entered By: Levan Hurst  on 12/30/2019 15:27:40 Prescription 12/30/2019 -------------------------------------------------------------------------------- Blair Promise MD Patient Name: Provider: 1941-03-19 [id number] Date of Birth: NPI#: [name] [id number] Sex: DEA #: [phone] [id number] Phone #: License #: [place] Patient Address: [address] [place], [place] [address] [place], [place] [address] [phone] Allergies Name Reaction Severity No Known Drug Allergies Provider's Orders MRI, right foot with and without contrast - ICD10: L97.512 - Non healing ulcer right plantar foot Hand Signature: Date(s): Electronic Signature(s) Signed: 12/30/2019 5:51:19 PM By: Linton Ham MD Signed: 01/03/2020 4:12:59 PM By: Levan Hurst RN, BSN Entered By: Levan Hurst on 12/30/2019 15:27:40 -------------------------------------------------------------------------------- Problem List Details Patient Name: Date of Service: [name], [name] 12/30/2019 2:30 PM Medical Record Number: [id number] Patient Account Number: [id number] Date of Birth/Sex: Treating RN: Nov 14, 1940 (79 y.o. [name] Primary Care Provider: [name] Other Clinician: Referring Provider: Treating Provider/Extender: Gabriel Rainwater in Treatment: 23 Active Problems ICD-10 Encounter Code Description Active Date MDM Diagnosis E11.621 Type 2 diabetes mellitus with foot ulcer 07/21/2019 No Yes L97.522 Non-pressure chronic ulcer of other part of left foot with fat layer exposed 07/21/2019 No Yes L97.512 Non-pressure chronic ulcer of other part of right foot with fat layer exposed 07/21/2019 No Yes I73.89 Other specified peripheral vascular  diseases 07/21/2019 No Yes L03.115 Cellulitis of right lower limb 12/16/2019 No Yes Inactive Problems ICD-10 Code Description Active Date Inactive Date F17.218 Nicotine dependence, cigarettes, with other nicotine-induced disorders 07/21/2019 07/21/2019 I10 Essential (primary) hypertension 07/21/2019 07/21/2019 L84 Corns and callosities 07/21/2019 07/21/2019 Resolved Problems Electronic Signature(s) Signed: 12/30/2019 5:51:19 PM By: Linton Ham MD Entered By: Linton Ham on 12/30/2019 15:27:56 -------------------------------------------------------------------------------- Progress Note Details Patient Name: Date of Service: [name], [name] 12/30/2019 2:30 PM Medical Record Number: [id number] Patient Account Number: [id number] Date of Birth/Sex: Treating RN: [birth year] (79 y.o. [name] Primary Care Provider: [name] Other Clinician: Referring Provider: Treating Provider/Extender: Gabriel Rainwater in Treatment: 23 Subjective History of Present Illness (HPI) very pleasant 79 year old gentleman has type 2 diabetes and has been having an ulcer on his foot for several years. Been seeing as this time around since the end of October. Classified as a Wagner grade 2 because his previous x-rays did not show any problems. He has an MRI pending this Friday. discussing with him he says he has not been able to get his diabetic shoes yet. He continues to smoke his pipe and drinks moonshine and says he will not give this up. I have tried to counsel him regarding this but he firmly says that he is happy to listen to me but he is not going to give up his habits. 08/17/14 -- patient has no fresh complaints but has recently come with his MRI which was done on 08/12/2014. 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 Readmission: 07/21/2019 upon evaluation today  patient presents for initial inspection here in our clinic concerning issues that he has been having with his bilateral feet. He has open wounds that been present at least since the beginning of the year although he does not know an exact time. He is previously had amputations of all toes and the distal portion of his foot. 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 With that being said he does have a history of diabetes, peripheral vascular disease,  nicotine dependence, and hypertension. He has not had any recent arterial or vascular studies he was noncompressible today I think he is going to require referral for arterial studies as well. 2/11; patient was readmitted to our clinic last week. He has bilateral plantar foot wounds in the setting of previous transmetatarsal amputations. He had his arterial studies done on 2/8; these were actually quite good. He was noncompressible on both sides however his waveforms were triphasic. The thought was noncompressible vessels consistent with medial calcification but without significant stenosis in the lower extremities. X-rays were done on the left this showed extensive chronic and postoperative changes without evidence of osteomyelitis. There was no evidence of osteomyelitis on the right foot there was mild vascular calcification MRI was suggested on the left based on clinical correlation. The patient arrives in clinic with some odor and drainage from the left foot. Right foot measures smaller. He is really not offloading these areas although he says he does not walk much 2/25; the patient arrives with some odor and drainage from the left foot again. Post debridement I cultured this area. He has the area on the midfoot roughly the third metatarsal head and the first metatarsal head on the right. He is in surgical shoes bilateral transmet 3/4; culture from last week showed abundant group B strep and a few Proteus. We will put him on  cephalexin starting today. Still silver alginate to the wounds 3/11; due to issues with pharmacy he still does not have the cephalexin that I prescribed a week ago. Apparently this was called into a different pharmacy on Friday but they have not delivered it to him. We have been using silver alginate on the wounds but because of lack of moisture change to silver collagen today. We are offloading as best we can in surgical shoes 3/18; he is taking the cephalexin that I prescribed 2 weeks ago. Using silver collagen. He has surgical shoes. Setting of bilateral previous transmetatarsal amputations. Wounds are mirror-image wounds medially. 3/25; mirror-image wounds in the setting of previous transmetatarsal amputation. The wounds are mirror image medial foot wounds. We have been using silver collagen. We are using surgical shoes offloaded with felt. He came in today asking about his "$900" custom-made shoes although after talking to them it does not appear that he ever really wore these and they are more than a year old 4/8; mirror-image wounds on the medial part of the plantar feet in the setting of previous transmetatarsal amputations. We have been using silver collagen. I aggressively debrided these 2 weeks ago removing callus subcutaneous tissue from around the wound margins. He did indeed bring echo he has custom-made shoes with transmetatarsal inserts. He has never worn these. He claims to be not spending a lot of time on his feet 4/15; mirror-image wounds on the plantar feet in the setting of previous transmetatarsal amputation. We have been using silver alginate. Culture that I did last time grew MRSA and Proteus. I have him on doxycycline. This was a deep tissue culture. The Proteus was not actively plated against doxycycline but was otherwise pansensitive I think the doxycycline would cover this. His wounds actually looks somewhat better. Rolled senescent edges still around the wound edges 4/29;  mirror-image wounds on the plantar feet in the setting of previous bilateral transmetatarsal amputations we have been using silver alginate. Last time I saw him 2 weeks ago I gave him a course of doxycycline. Things seem to have cleaned up. This is in response to a  deep tissue culture that showed MRSA and Proteus 5/6; wounds on the bilateral plantar feet in the setting of previous transmetatarsal amputations. We have been using silver alginate. Arrives in the clinic today with the same raised thick callus and subcutaneous tissue. He has home health changing the dressing. He brought up the fact that he has transportation through [facility] apparently he only has "12 visits". Otherwise he has the pay $40 for a cab ride there and back from his [place] which I guess [place] [address]. 11/11/19-Patient back with left transmet plantar surface much worse with extension of devitalized tissue proximally from the original wound with dense callus rim, the right is about the same, apparently patient was wearing tennis shoes at home per home health, although patient swears that he has been using his offloading devices that he has with inserts. 6/8; I have not seen this patient in over a month however he was seen last week in my absence. He has mirror-image wounds over his bilateral transmit plantar surfaces. He has been using silver alginate. He comes in today with literally a mountain of callus around the 2 wounds on the left. The area on the right somewhat better but still callus and nonviable tissue around the circular wound with some depth. He had an x-ray done last week of the left foot that was negative a culture showed multiple organisms. He did not get any antibiotics. He has custom-made shoes and apparently is getting another pair from [employer] that is in the process. I told him that we are not offloading the sufficiently for what ever reason. I do not think he is wearing his custom shoes at home  although he says he is not walking that much 6/17 bilateral plantar foot in the setting of previous transmetatarsal amputations. He has been using silver alginate. He has his own custom made shoes which he is supposed to have it replaced in the next week or 2. We have been extensively debriding the callus around both wound sites and things look slightly better today 6/24; patient arrives in clinic today with her intake nurse pointing out odor and drainage. We have been using silver alginate. I was going to put a total contact cast on this leg today but in view of the odor and drainage will postpone that 7/1; intake nurse still points out odor and drainage. Last week I did a deep tissue culture of the right foot this grew a few methicillin resistant staph aureus, few group B strep and a few Pseudomonas. No anaerobes were isolated. He will require a course of 2 different antibiotics probably ciprofloxacin and doxycycline. As usual he has thick callus around the wound 7/15; copious amounts of callus. X-rays I did last time showed chronic changes versus possible osteomyelitis at the plantar aspect of the proximal metatarsal. We will therefore go ahead with the MRI. 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 We have been using silver alginate. Objective Constitutional Patient is hypertensive.. Pulse regular and within target range for patient.Marland Kitchen Respirations regular, non-labored and within target range.. Temperature is normal and within the target range for the patient.Marland Kitchen Appears in no distress. Vitals Time Taken: 2:45 PM, Height: 73 in, Weight: 180 lbs, BMI: 23.7, Temperature: 98 F, Pulse: 69 bpm, Respiratory Rate: 19 breaths/min, Blood Pressure: 155/91 mmHg. General Notes: Wound exam ooAbsolutely neither 1 of these is any better. Thick callus around the circumference macerated tissue on the  right. Her intake nurse reported an  odor here. There is no purulent drainage the area on the right does not go to bone Integumentary (Hair, Skin) Wound #21 status is Open. Original cause of wound was Gradually Appeared. The wound is located on the [place]. The wound measures 0.2cm length x 0.2cm width x 0.6cm depth; 0.031cm^2 area and 0.019cm^3 volume. There is Fat Layer (Subcutaneous Tissue) Exposed exposed. There is no tunneling noted, however, there is undermining starting at 12:00 and ending at 12:00 with a maximum distance of 0.5cm. There is a medium amount of serosanguineous drainage noted. The wound margin is thickened. There is large (67-100%) pink granulation within the wound bed. There is no necrotic tissue within the wound bed. Wound #22 status is Open. Original cause of wound was Gradually Appeared. The wound is located on the Right Metatarsal head first. The wound measures 1.4cm length x 1.4cm width x 0.7cm depth; 1.539cm^2 area and 1.078cm^3 volume. There is Fat Layer (Subcutaneous Tissue) Exposed exposed. There is no tunneling noted, however, there is undermining starting at 1:00 and ending at 9:00 with a maximum distance of 1.1cm. There is a medium amount of serous drainage noted. Foul odor after cleansing was noted. The wound margin is thickened. There is large (67-100%) pink granulation within the wound bed. There is no necrotic tissue within the wound bed. 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 There was a Selective/Open Wound Non-Viable Tissue  Debridement with a total area of 1 sq cm performed by Ricard Dillon., MD. With the following instrument(s): Blade, and Forceps to remove Non-Viable tissue/material. Material removed includes Callus. No specimens were taken. A time out was conducted at 15:22, prior to the start of the procedure. A Minimum amount of bleeding was controlled with Pressure. The procedure was tolerated well with a pain level of 0 throughout and a pain level of 0 following the procedure. Post Debridement Measurements: 0.2cm length x 0.2cm width x 0.6cm depth; 0.019cm^3 volume. Character of Wound/Ulcer Post Debridement is improved. Severity of Tissue Post Debridement is: Fat layer exposed. 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 There was a Excisional Skin/Subcutaneous Tissue Debridement with a total area of 1.96 sq cm performed by Ricard Dillon., MD. With the following instrument(s): Blade, and Forceps to remove Non-Viable tissue/material. Material removed includes Callus and Subcutaneous Tissue and. No specimens were taken. A time out was conducted at 15:22, prior to the start of the procedure. A Moderate amount of bleeding was controlled with Silver Nitrate. The procedure was tolerated well with a pain level of 0 throughout and a pain level of 0 following the procedure. Post Debridement Measurements: 1.4cm length x 1.4cm width x 0.7cm depth; 1.078cm^3 volume. Character of Wound/Ulcer Post Debridement is improved. Severity of Tissue Post Debridement is: Fat layer exposed. Post procedure Diagnosis Wound #22: Same as Pre-Procedure Plan Follow-up Appointments: Return Appointment in 1 week. Dressing Change  Frequency: Wound #21 Left,Plantar Foot: Change dressing three times week. Wound #22 Right  Metatarsal head first: Change dressing three times week. Wound Cleansing: Clean wound with Wound Cleanser - or normal saline Primary Wound Dressing: Wound #21 Left,Plantar Foot: Calcium Alginate with Silver Wound #22 Right Metatarsal head first: Calcium Alginate with Silver - ensure to pack into wound bed and any undermining Secondary Dressing: Wound #21 Left,Plantar Foot: Foam - foam donut to offload Kerlix/Rolled Gauze Dry Gauze Wound #22 Right Metatarsal head first: Foam - foam donut to offload Kerlix/Rolled Gauze Dry Gauze Edema Control: Avoid standing for long periods of time Elevate legs to the level of the heart or above for 30 minutes daily and/or when sitting, a frequency of: - throughout the day. Off-Loading: Other: - felt patient's personal shoes. patient to wear diabetic shoes. patient to limit about of time spent walking and standing. Home Health: [place] [facility] for wound care. - [facility] ordered were: MRI, right foot with and without contrast - Non healing ulcer right plantar foot 1. Absolutely no progress with either of these wounds 2. He will need an MRI of the right foot to follow-up on the x-rayo Osteomyelitis 3. No further antibiotics for now 4. I do not know how we can offload these things. Consider a total contact cast but await the MRI first. Electronic Signature(s) Signed: 12/30/2019 5:51:19 PM By: Linton Ham MD Entered By: Linton Ham on 12/30/2019 15:33:48 -------------------------------------------------------------------------------- SuperBill Details Patient Name: Date of Service: [name], [name] 12/30/2019 Medical Record Number: [id number] Patient Account Number: [id number] Date of Birth/Sex: Treating RN: 06-Jul-1940 (79 y.o. [name] Primary Care Provider: [name] Other Clinician: Referring Provider: Treating Provider/Extender: Gabriel Rainwater in  Treatment: 23 Diagnosis Coding ICD-10 Codes Code Description E11.621 Type 2 diabetes mellitus with foot ulcer L97.522 Non-pressure chronic ulcer of other part of left foot with fat layer exposed L97.512 Non-pressure chronic ulcer of other part of right foot with fat layer exposed I73.89 Other specified peripheral vascular diseases L03.115 Cellulitis of right lower limb Facility Procedures The patient participates with Medicare or their insurance follows the Medicare Facility Guidelines: CPT4 Code Description Modifier Quantity [id number] - DEB SUBQ TISSUE 20 SQ CM/< 1 ICD-10 Diagnosis Description L97.512 Non-pressure chronic ulcer of  other part of right foot with fat layer exposed The patient participates with Medicare or their insurance follows the Medicare Facility Guidelines: [id number] - DEBRIDE WOUND 1ST 20 SQ CM OR < 59 1 ICD-10 Diagnosis Description L97.522 Non-pressure chronic ulcer of other part of left foot with fat  layer exposed Physician Procedures : CPT4 Code Description Modifier [id number] - WC PHYS SUBQ TISS 20 SQ CM ICD-10 Diagnosis Description L97.512 Non-pressure chronic ulcer of other part of right foot with fat layer exposed Quantity: 1 : [id number] - WC PHYS DEBR WO ANESTH 20 SQ CM 59 ICD-10 Diagnosis Description L97.522 Non-pressure chronic ulcer of other part of left foot with fat layer exposed Quantity: 1 Electronic Signature(s) Signed: 12/30/2019 5:51:19 PM By: Linton Ham MD Entered By: Linton Ham on 12/30/2019 15:34:38

## 2020-01-06 ENCOUNTER — Encounter (HOSPITAL_BASED_OUTPATIENT_CLINIC_OR_DEPARTMENT_OTHER): Payer: Medicare Other | Admitting: Internal Medicine

## 2020-01-06 DIAGNOSIS — E11621 Type 2 diabetes mellitus with foot ulcer: Secondary | ICD-10-CM | POA: Diagnosis not present

## 2020-01-06 NOTE — Progress Notes (Signed)
VENUS, RUHE ([id number]) Visit Report for 01/06/2020 Arrival Information Details Patient Name: Date of Service: [name], [place] [name] 01/06/2020 8:00 A M Medical Record Number: [id number] Patient Account Number: [id number] Date of Birth/Sex: Treating RN: [birth year] (79 y.o. [name] Primary Care Giovonnie Trettel: [name] Other Clinician: Referring Tuyet Bader: Treating Landy Mace/Extender: Gabriel Rainwater in Treatment: 24 Visit Information History Since Last Visit Added or deleted any medications: No Patient Arrived: [name] Any new allergies or adverse reactions: No Arrival Time: 08:10 Had a fall or experienced change in No Accompanied By: self activities of daily living that may affect Transfer Assistance: None risk of falls: Patient Identification Verified: Yes Signs or symptoms of abuse/neglect since last No Secondary Verification Process Completed: Yes visito Patient Requires Transmission-Based Precautions: No Hospitalized since last visit: No Patient Has Alerts: Yes Implantable device outside of the clinic excluding No Patient Alerts: R ABI non compressible cellular tissue based products placed in the center L ABI non compressible since last visit: Has Dressing in Place as Prescribed: Yes Has Footwear/Offloading in Place as Prescribed: Yes Left: Other:custom diabetic shoes Right: Other:custom diabetic shoes Pain Present Now: Yes Electronic Signature(s) Signed: 01/06/2020 4:56:58 PM By: Baruch Gouty RN, BSN Entered By: Baruch Gouty on 01/06/2020 08:13:56 -------------------------------------------------------------------------------- Encounter Discharge Information Details Patient Name: Date of Service: [name] 01/06/2020 8:00 A M Medical Record Number: [id number] Patient Account Number: [id number] Date of Birth/Sex: Treating RN: [birth year] (79 y.o. [name] Primary Care Samyukta Cura: [name] Other  Clinician: Referring Vardaan Depascale: Treating Kavya Haag/Extender: Gabriel Rainwater in Treatment: 24 Encounter Discharge Information Items Post Procedure Vitals Discharge Condition: Stable Temperature (F): 99 Ambulatory Status: Cane Pulse (bpm): 70 Discharge Destination: Home Respiratory Rate (breaths/min): 18 Transportation: Other Blood Pressure (mmHg): 108/57 Accompanied By: self Schedule Follow-up Appointment: Yes Clinical Summary of Care: Patient Declined Electronic Signature(s) Signed: 01/06/2020 4:44:31 PM By: Kela Millin Entered By: Kela Millin on 01/06/2020 09:01:03 -------------------------------------------------------------------------------- Lower Extremity Assessment Details Patient Name: Date of Service: [name] 01/06/2020 8:00 A M Medical Record Number: [id number] Patient Account Number: [id number] Date of Birth/Sex: Treating RN: [birth year] (79 y.o. [name] Primary Care Naylene Foell: [name] Other Clinician: Referring Kaiser Belluomini: Treating Delron Comer/Extender: Gabriel Rainwater in Treatment: 24 Edema Assessment Assessed: [Left: No] [Right: No] Edema: [Left: Yes] [Right: Yes] Calf Left: Right: Point of Measurement: 32 cm From Medial Instep 29.7 cm 33.5 cm Ankle Left: Right: Point of Measurement: 10 cm From Medial Instep 22.1 cm 26.6 cm Vascular Assessment Pulses: Dorsalis Pedis Palpable: [Left:Yes] [Right:Yes] Electronic Signature(s) Signed: 01/06/2020 4:56:58 PM By: Baruch Gouty RN, BSN Entered By: Baruch Gouty on 01/06/2020 08:23:02 -------------------------------------------------------------------------------- Multi Wound Chart Details Patient Name: Date of Service: Casey [name] 01/06/2020 8:00 A M Medical Record Number: [id number] Patient Account Number: [id number] Date of Birth/Sex: Treating RN: [birth year] (79 y.o. [name] Primary Care  Gradie Butrick: [name] Other Clinician: Referring Peta Peachey: Treating Therin Vetsch/Extender: Gabriel Rainwater in Treatment: 24 Vital Signs Height(in): 73 Capillary Blood Glucose(mg/dl): 100 Weight(lbs): 180 Pulse(bpm): 61 Body Mass Index(BMI): 24 Blood Pressure(mmHg): 108/57 Temperature(F): 99 Respiratory Rate(breaths/min): 18 Photos: [21:No Photos Left, Plantar Foot] [22:No Photos Right Metatarsal head first] [N/A:N/A N/A] Wound Location: [21:Gradually Appeared] [22:Gradually Appeared] [N/A:N/A] Wounding Event: [21:Diabetic Wound/Ulcer of the Lower] [22:Diabetic Wound/Ulcer of the Lower] [N/A:N/A] Primary Etiology: [21:Extremity Cataracts, Glaucoma, Hypertension,] [22:Extremity Cataracts, Glaucoma, Hypertension,] [N/A:N/A] Comorbid History: [21:Peripheral Arterial Disease, Type II Diabetes, Osteomyelitis,  Neuropathy 06/17/2018] [22:Peripheral Arterial Disease, Type II Diabetes, Osteomyelitis, Neuropathy 06/17/2018] [N/A:N/A] Date Acquired: [21:24] [22:24] [N/A:N/A] Weeks of Treatment: [21:Open] [22:Open] [N/A:N/A] Wound Status: [21:0.4x0.4x0.5] [22:1.1x1.6x0.5] [N/A:N/A] Measurements L x W x D (cm) [21:0.126] [22:1.382] [N/A:N/A] A (cm) : rea [21:0.063] [22:0.691] [N/A:N/A] Volume (cm) : [21:94.50%] [22:-120.10%] [N/A:N/A] % Reduction in A rea: [21:94.50%] [22:-267.60%] [N/A:N/A] % Reduction in Volume: [21:1] Starting Position 1 (o'clock): [21:9] Ending Position 1 (o'clock): [21:0.5] Maximum Distance 1 (cm): [21:Yes] [22:No] [N/A:N/A] Undermining: [21:Grade 2] [22:Grade 2] [N/A:N/A] Classification: [21:Medium] [22:Medium] [N/A:N/A] Exudate A mount: [21:Serosanguineous] [22:Serosanguineous] [N/A:N/A] Exudate Type: [21:red, brown] [22:red, brown] [N/A:N/A] Exudate Color: [21:No] [22:Yes] [N/A:N/A] Foul Odor A Cleansing: [21:fter N/A] [22:No] [N/A:N/A] Odor A nticipated Due to Product Use: [21:Thickened] [22:Thickened] [N/A:N/A] Wound Margin:  [21:Large (67-100%)] [22:Large (67-100%)] [N/A:N/A] Granulation A mount: [21:Pink] [22:Red] [N/A:N/A] Granulation Quality: [21:None Present (0%)] [22:None Present (0%)] [N/A:N/A] Necrotic A mount: [21:Fat Layer (Subcutaneous Tissue)] [22:Fat Layer (Subcutaneous Tissue)] [N/A:N/A] Exposed Structures: [21:Exposed: Yes Fascia: No Tendon: No Muscle: No Joint: No Bone: No None] [22:Exposed: Yes Fascia: No Tendon: No Muscle: No Joint: No Bone: No Small (1-33%)] [N/A:N/A] Epithelialization: [21:Debridement - Excisional] [22:Debridement - Excisional] [N/A:N/A] Debridement: Pre-procedure Verification/Time Out 08:36 [22:08:36] [N/A:N/A] Taken: [21:Lidocaine 5% topical ointment] [22:Lidocaine 5% topical ointment] [N/A:N/A] Pain Control: [21:Callus, Subcutaneous] [22:Callus, Subcutaneous] [N/A:N/A] Tissue Debrided: [21:Skin/Subcutaneous Tissue] [22:Skin/Subcutaneous Tissue] [N/A:N/A] Level: [21:0.16] [22:1.76] [N/A:N/A] Debridement A (sq cm): [21:rea Curette] [22:Curette] [N/A:N/A] Instrument: [21:Minimum] [22:Minimum] [N/A:N/A] Bleeding: [21:Pressure] [22:Pressure] [N/A:N/A] Hemostasis A chieved: [21:0] [22:0] [N/A:N/A] Procedural Pain: [21:0] [22:0] [N/A:N/A] Post Procedural Pain: [21:Procedure was tolerated well] [22:Procedure was tolerated well] [N/A:N/A] Debridement Treatment Response: [21:0.4x0.4x0.5] [22:1.1x1.6x0.5] [N/A:N/A] Post Debridement Measurements L x W x D (cm) [21:0.063] [22:0.691] [N/A:N/A] Post Debridement Volume: (cm) [21:N/A] [22:macerated wound edges] [N/A:N/A] Assessment Notes: [21:Debridement] [22:Debridement] [N/A:N/A] Treatment Notes Electronic Signature(s) Signed: 01/06/2020 5:02:14 PM By: Zandra Abts RN, BSN Signed: 01/06/2020 5:29:29 PM By: Baltazar Najjar MD Entered By: Baltazar Najjar on 01/06/2020 08:51:54 -------------------------------------------------------------------------------- Multi-Disciplinary Care Plan Details Patient Name: Date of Service: Casey Val Eagle RE,  [name] [name] 01/06/2020 8:00 A M Medical Record Number: [id number] Patient Account Number: [id number] Date of Birth/Sex: Treating RN: 06/07/1941 (79 y.o. Elizebeth Koller Primary Care Jakson Delpilar: Lorenda Ishihara Other Clinician: Referring Jaquavious Mercer: Treating Kenley Rettinger/Extender: Maryann Alar in Treatment: 24 Active Inactive Wound/Skin Impairment Nursing Diagnoses: Impaired tissue integrity Knowledge deficit related to smoking impact on wound healing Knowledge deficit related to ulceration/compromised skin integrity Goals: Patient will demonstrate a reduced rate of smoking or cessation of smoking Date Initiated: 07/21/2019 Date Inactivated: 11/23/2019 Target Resolution Date: 11/12/2019 Goal Status: Met Patient/caregiver will verbalize understanding of skin care regimen Date Initiated: 07/21/2019 Target Resolution Date: 01/14/2020 Goal Status: Active Ulcer/skin breakdown will have a volume reduction of 30% by week 4 Date Initiated: 07/21/2019 Date Inactivated: 09/23/2019 Target Resolution Date: 09/17/2019 Unmet Reason: see wound Goal Status: Unmet measurements. Interventions: Assess patient/caregiver ability to obtain necessary supplies Assess patient/caregiver ability to perform ulcer/skin care regimen upon admission and as needed Assess ulceration(s) every visit Provide education on smoking Provide education on ulcer and skin care Treatment Activities: Skin care regimen initiated : 07/21/2019 Topical wound management initiated : 07/21/2019 Notes: Electronic Signature(s) Signed: 01/06/2020 5:02:14 PM By: Zandra Abts RN, BSN Entered By: Zandra Abts on 01/06/2020 09:19:46 -------------------------------------------------------------------------------- Pain Assessment Details Patient Name: Date of Service: Casey Val Eagle RE, [name] [name] 01/06/2020 8:00 A M Medical Record Number: [id number] Patient Account Number: [id number] Date of Birth/Sex: Treating RN: 04-30-1941  (79 y.o. Damaris Schooner  Primary Care Jaterrius Ricketson: Lorenda Ishihara Other Clinician: Referring Holten Spano: Treating Masashi Snowdon/Extender: Maryann Alar in Treatment: 24 Active Problems Location of Pain Severity and Description of Pain Patient Has Paino Yes Site Locations Pain Location: Generalized Pain With Dressing Change: No Rate the pain. Current Pain Level: 4 Character of Pain Describe the Pain: Other: neuropathy pain, stinging Pain Management and Medication Current Pain Management: Medication: Yes Other: tolerable Is the Current Pain Management Adequate: Adequate How does your wound impact your activities of daily livingo Sleep: Yes Bathing: No Appetite: No Relationship With Others: No Bladder Continence: No Emotions: No Bowel Continence: No Hobbies: No Toileting: No Dressing: No Electronic Signature(s) Signed: 01/06/2020 4:56:58 PM By: Zenaida Deed RN, BSN Entered By: Zenaida Deed on 01/06/2020 08:17:11 -------------------------------------------------------------------------------- Patient/Caregiver Education Details Patient Name: Date of Service: Casey Val Eagle RE, [name] [name] 7/22/2021andnbsp8:00 A M Medical Record Number: [id number] Patient Account Number: [id number] Date of Birth/Gender: Treating RN: 11/27/1940 (79 y.o. Elizebeth Koller Primary Care Physician: Lorenda Ishihara Other Clinician: Referring Physician: Treating Physician/Extender: Maryann Alar in Treatment: 24 Education Assessment Education Provided To: Patient Education Topics Provided Wound/Skin Impairment: Methods: Explain/Verbal Responses: State content correctly [employer]) Signed: 01/06/2020 5:02:14 PM By: Zandra Abts RN, BSN Entered By: Zandra Abts on 01/06/2020 09:19:59 -------------------------------------------------------------------------------- Wound Assessment Details Patient Name: Date of  Service: Casey Val Eagle RE, [name] [name] 01/06/2020 8:00 A M Medical Record Number: [id number] Patient Account Number: [id number] Date of Birth/Sex: Treating RN: 1940-12-12 (79 y.o. Damaris Schooner Primary Care Ericha Whittingham: Lorenda Ishihara Other Clinician: Referring Zilpha Mcandrew: Treating Hasset Chaviano/Extender: Maryann Alar in Treatment: 24 Wound Status Wound Number: 21 Primary Diabetic Wound/Ulcer of the Lower Extremity Etiology: Wound Location: Left, Plantar Foot Wound Open Wounding Event: Gradually Appeared Status: Date Acquired: 06/17/2018 Date Acquired: 06/17/2018 Comorbid Cataracts, Glaucoma, Hypertension, Peripheral Arterial Disease, Weeks Of Treatment: 24 History: Type II Diabetes, Osteomyelitis, Neuropathy Clustered Wound: No Wound Measurements Length: (cm) 0.4 Width: (cm) 0.4 Depth: (cm) 0.5 Area: (cm) 0.126 Volume: (cm) 0.063 % Reduction in Area: 94.5% % Reduction in Volume: 94.5% Epithelialization: None Undermining: Yes Starting Position (o'clock): 1 Ending Position (o'clock): 9 Maximum Distance: (cm) 0.5 Wound Description Classification: Grade 2 Wound Margin: Thickened Exudate Amount: Medium Exudate Type: Serosanguineous Exudate Color: red, brown Foul Odor After Cleansing: No Slough/Fibrino No Wound Bed Granulation Amount: Large (67-100%) Exposed Structure Granulation Quality: Pink Fascia Exposed: No Necrotic Amount: None Present (0%) Fat Layer (Subcutaneous Tissue) Exposed: Yes Tendon Exposed: No Muscle Exposed: No Joint Exposed: No Bone Exposed: No Treatment Notes Wound #21 (Left, Plantar Foot) 1. Cleanse With Wound Cleanser 2. Periwound Care Skin Prep 3. Primary Dressing Applied Calcium Alginate Ag 4. Secondary Dressing Dry Gauze Roll Gauze Foam 5. Secured With Tape Notes foam donut. Electronic Signature(s) Signed: 01/06/2020 4:56:58 PM By: Zenaida Deed RN, BSN Entered By: Zenaida Deed on 01/06/2020  08:27:46 -------------------------------------------------------------------------------- Wound Assessment Details Patient Name: Date of Service: [name] 01/06/2020 8:00 A M Medical Record Number: [id number] Patient Account Number: [id number] Date of Birth/Sex: Treating RN: 1941-02-05 (79 y.o. Damaris Schooner Primary Care Quentin Shorey: Lorenda Ishihara Other Clinician: Referring Syncere Kaminski: Treating Lira Stephen/Extender: Maryann Alar in Treatment: 24 Wound Status Wound Number: 22 Primary Diabetic Wound/Ulcer of the Lower Extremity Etiology: Etiology: Wound Location: Right Metatarsal head first Wound Open Wounding Event: Gradually Appeared Status: Date Acquired: 06/17/2018 Comorbid Cataracts, Glaucoma, Hypertension, Peripheral Arterial Disease, Weeks Of Treatment: 24 History: Type II Diabetes, Osteomyelitis, Neuropathy Clustered Wound: No  Wound Measurements Length: (cm) 1.1 Width: (cm) 1.6 Depth: (cm) 0.5 Area: (cm) 1.382 Volume: (cm) 0.691 % Reduction in Area: -120.1% % Reduction in Volume: -267.6% Epithelialization: Small (1-33%) Tunneling: No Undermining: No Wound Description Classification: Grade 2 Wound Margin: Thickened Exudate Amount: Medium Exudate Type: Serosanguineous Exudate Color: red, brown Foul Odor After Cleansing: Yes Due to Product Use: No Slough/Fibrino No Wound Bed Granulation Amount: Large (67-100%) Exposed Structure Granulation Quality: Red Fascia Exposed: No Necrotic Amount: None Present (0%) Fat Layer (Subcutaneous Tissue) Exposed: Yes Tendon Exposed: No Muscle Exposed: No Joint Exposed: No Bone Exposed: No Assessment Notes macerated wound edges Treatment Notes Wound #22 (Right Metatarsal head first) 1. Cleanse With Wound Cleanser 2. Periwound Care Skin Prep 3. Primary Dressing Applied Calcium Alginate Ag 4. Secondary Dressing Dry Gauze Roll Gauze Foam 5. Secured  With Tape Notes foam donut. Electronic Signature(s) Signed: 01/06/2020 4:56:58 PM By: Baruch Gouty RN, BSN Entered By: Baruch Gouty on 01/06/2020 08:28:30 -------------------------------------------------------------------------------- Vitals Details Patient Name: Date of Service: [name] 01/06/2020 8:00 A M Medical Record Number: [id number] Patient Account Number: [id number] Date of Birth/Sex: Treating RN: May 22, 1941 (79 y.o. [name] Primary Care Kendrah Lovern: [name] Other Clinician: Referring Jayona Mccaig: Treating Dhruva Orndoff/Extender: Gabriel Rainwater in Treatment: 24 Vital Signs Time Taken: 08:13 Temperature (F): 99 Height (in): 73 Pulse (bpm): 70 Source: Stated Respiratory Rate (breaths/min): 18 Weight (lbs): 180 Blood Pressure (mmHg): 108/57 Source: Stated Capillary Blood Glucose (mg/dl): 100 Body Mass Index (BMI): 23.7 Reference Range: 80 - 120 mg / dl Notes glucose per pt report yesterday Electronic Signature(s) Signed: 01/06/2020 4:56:58 PM By: Baruch Gouty RN, BSN Entered By: Baruch Gouty on 01/06/2020 08:14:54

## 2020-01-06 NOTE — Progress Notes (Signed)
KYAN, YURKOVICH ([id number]) Visit Report for 01/06/2020 Debridement Details Patient Name: Date of Service: [name], [place] 01/06/2020 8:00 A M Medical Record Number: [id number] Patient Account Number: [id number] Date of Birth/Sex: Treating RN: Nov 02, 1940 (79 y.o. [name] Primary Care Provider: [name] Other Clinician: Referring Provider: Treating Provider/Extender: Gabriel Rainwater in Treatment: 24 Debridement Performed for Assessment: Wound #21 [place] Performed By: Physician Ricard Dillon., MD Debridement Type: Debridement Severity of Tissue Pre Debridement: Fat layer exposed Level of Consciousness (Pre-procedure): Awake and Alert Pre-procedure Verification/Time Out Yes - 08:36 Taken: Start Time: 08:36 Pain Control: Lidocaine 5% topical ointment T Area Debrided (L x W): otal 0.4 (cm) x 0.4 (cm) = 0.16 (cm) Tissue and other material debrided: Viable, Non-Viable, Callus, Subcutaneous Level: Skin/Subcutaneous Tissue Debridement Description: Excisional Instrument: Curette Bleeding: Minimum Hemostasis Achieved: Pressure End Time: 08:37 Procedural Pain: 0 Post Procedural Pain: 0 Response to Treatment: Procedure was tolerated well Level of Consciousness (Post- Awake and Alert procedure): Post Debridement Measurements of Total Wound Length: (cm) 0.4 Width: (cm) 0.4 Depth: (cm) 0.5 Volume: (cm) 0.063 Character of Wound/Ulcer Post Debridement: Improved Severity of Tissue Post Debridement: Fat layer exposed Post Procedure Diagnosis Same as Pre-procedure Electronic Signature(s) Signed: 01/06/2020 5:02:14 PM By: Levan Hurst RN, BSN Signed: 01/06/2020 5:29:29 PM By: Linton Ham MD Entered By: Linton Ham on 01/06/2020 08:52:08 -------------------------------------------------------------------------------- Debridement Details Patient Name: Date of Service: [name], [name] 01/06/2020 8:00 A M Medical  Record Number: [id number] Patient Account Number: [id number] Date of Birth/Sex: Treating RN: Apr 24, 1941 (79 y.o. [name] Primary Care Provider: [name] Other Clinician: Referring Provider: Treating Provider/Extender: Gabriel Rainwater in Treatment: 24 Debridement Performed for Assessment: Wound #22 Right Metatarsal head first Performed By: Physician Ricard Dillon., MD Debridement Type: Debridement Severity of Tissue Pre Debridement: Fat layer exposed Level of Consciousness (Pre-procedure): Awake and Alert Pre-procedure Verification/Time Out Yes - 08:36 Taken: Start Time: 08:36 Pain Control: Lidocaine 5% topical ointment T Area Debrided (L x W): otal 1.1 (cm) x 1.6 (cm) = 1.76 (cm) Tissue and other material debrided: Viable, Non-Viable, Callus, Subcutaneous Level: Skin/Subcutaneous Tissue Debridement Description: Excisional Instrument: Curette Bleeding: Minimum Hemostasis Achieved: Pressure End Time: 08:37 Procedural Pain: 0 Post Procedural Pain: 0 Response to Treatment: Procedure was tolerated well Level of Consciousness (Post- Awake and Alert procedure): Post Debridement Measurements of Total Wound Length: (cm) 1.1 Width: (cm) 1.6 Depth: (cm) 0.5 Volume: (cm) 0.691 Character of Wound/Ulcer Post Debridement: Improved Severity of Tissue Post Debridement: Fat layer exposed Post Procedure Diagnosis Same as Pre-procedure Electronic Signature(s) Signed: 01/06/2020 5:02:14 PM By: Levan Hurst RN, BSN Signed: 01/06/2020 5:29:29 PM By: Linton Ham MD Entered By: Linton Ham on 01/06/2020 08:52:17 -------------------------------------------------------------------------------- HPI Details Patient Name: Date of Service: [name], [name] 01/06/2020 8:00 A M Medical Record Number: [id number] Patient Account Number: [id number] Date of Birth/Sex: Treating RN: [birth year] (79 y.o. [name] Primary Care Provider:  [name] Other Clinician: Referring Provider: Treating Provider/Extender: Gabriel Rainwater in Treatment: 24 History of Present Illness HPI Description: very pleasant 79 year old gentleman has type 2 diabetes and has been having an ulcer on his foot for several years. Been seeing as this time around since the end of October. Classified as a Wagner grade 2 because his previous x-rays did not show any problems. He has an MRI pending this Friday. discussing with him he says he has not been able to get his  diabetic shoes yet. He continues to smoke his pipe and drinks moonshine and says he will not give this up. I have tried to counsel him regarding this but he firmly says that he is happy to listen to me but he is not going to give up his habits. 08/17/14 -- patient has no fresh complaints but has recently come with his MRI which was done on 08/12/2014. 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 Readmission: 07/21/2019 upon evaluation today patient presents for initial inspection here in our clinic concerning issues that he has been having with his bilateral feet. He has open wounds that been present at least since the beginning of the year although he does not know an exact time. He is previously had amputations of all toes and the distal portion of his foot. 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 With that being said he does have a history of diabetes, peripheral vascular disease, nicotine dependence, and hypertension. He has not had any recent arterial or vascular studies he was noncompressible today I think he is going to require referral for arterial studies as well. 2/11; patient was readmitted to our clinic last week. He has bilateral plantar foot wounds in the setting of previous transmetatarsal amputations. He had his arterial  studies done on 2/8; these were actually quite good. He was noncompressible on both sides however his waveforms were triphasic. The thought was noncompressible vessels consistent with medial calcification but without significant stenosis in the lower extremities. X-rays were done on the left this showed extensive chronic and postoperative changes without evidence of osteomyelitis. There was no evidence of osteomyelitis on the right foot there was mild vascular calcification MRI was suggested on the left based on clinical correlation. The patient arrives in clinic with some odor and drainage from the left foot. Right foot measures smaller. He is really not offloading these areas although he says he does not walk much 2/25; the patient arrives with some odor and drainage from the left foot again. Post debridement I cultured this area. He has the area on the midfoot roughly the third metatarsal head and the first metatarsal head on the right. He is in surgical shoes bilateral transmet 3/4; culture from last week showed abundant group B strep and a few Proteus. We will put him on cephalexin starting today. Still silver alginate to the wounds 3/11; due to issues with pharmacy he still does not have the cephalexin that I prescribed a week ago. Apparently this was called into a different pharmacy on Friday but they have not delivered it to him. We have been using silver alginate on the wounds but because of lack of moisture change to silver collagen today. We are offloading as best we can in surgical shoes 3/18; he is taking the cephalexin that I prescribed 2 weeks ago. Using silver collagen. He has surgical shoes. Setting of bilateral previous transmetatarsal amputations. Wounds are mirror-image wounds medially. 3/25; mirror-image wounds in the setting of previous transmetatarsal amputation. The wounds are mirror image medial foot wounds. We have been using silver collagen. We are using surgical shoes  offloaded with felt. He came in today asking about his "$900" custom-made shoes although after talking to them it does not appear that he ever really wore these and they are more than a year old 4/8; mirror-image wounds on the medial part of the plantar feet in the setting of  previous transmetatarsal amputations. We have been using silver collagen. I aggressively debrided these 2 weeks ago removing callus subcutaneous tissue from around the wound margins. He did indeed bring echo he has custom-made shoes with transmetatarsal inserts. He has never worn these. He claims to be not spending a lot of time on his feet 4/15; mirror-image wounds on the plantar feet in the setting of previous transmetatarsal amputation. We have been using silver alginate. Culture that I did last time grew MRSA and Proteus. I have him on doxycycline. This was a deep tissue culture. The Proteus was not actively plated against doxycycline but was otherwise pansensitive I think the doxycycline would cover this. His wounds actually looks somewhat better. Rolled senescent edges still around the wound edges 4/29; mirror-image wounds on the plantar feet in the setting of previous bilateral transmetatarsal amputations we have been using silver alginate. Last time I saw him 2 weeks ago I gave him a course of doxycycline. Things seem to have cleaned up. This is in response to a deep tissue culture that showed MRSA and Proteus 5/6; wounds on the bilateral plantar feet in the setting of previous transmetatarsal amputations. We have been using silver alginate. Arrives in the clinic today with the same raised thick callus and subcutaneous tissue. He has home health changing the dressing. He brought up the fact that he has transportation through [facility] apparently he only has "12 visits". Otherwise he has the pay $40 for a cab ride there and back from his home which I guess is [place] [address]. 11/11/19-Patient back with left  transmet plantar surface much worse with extension of devitalized tissue proximally from the original wound with dense callus rim, the right is about the same, apparently patient was wearing tennis shoes at home per home health, although patient swears that he has been using his offloading devices that he has with inserts. 6/8; I have not seen this patient in over a month however he was seen last week in my absence. He has mirror-image wounds over his bilateral transmit plantar surfaces. He has been using silver alginate. He comes in today with literally a mountain of callus around the 2 wounds on the left. The area on the right somewhat better but still callus and nonviable tissue around the circular wound with some depth. He had an x-ray done last week of the left foot that was negative a culture showed multiple organisms. He did not get any antibiotics. He has custom-made shoes and apparently is getting another pair from [employer] that is in the process. I told him that we are not offloading the sufficiently for what ever reason. I do not think he is wearing his custom shoes at home although he says he is not walking that much 6/17 bilateral plantar foot in the setting of previous transmetatarsal amputations. He has been using silver alginate. He has his own custom made shoes which he is supposed to have it replaced in the next week or 2. We have been extensively debriding the callus around both wound sites and things look slightly better today 6/24; patient arrives in clinic today with her intake nurse pointing out odor and drainage. We have been using silver alginate. I was going to put a total contact cast on this leg today but in view of the odor and drainage will postpone that 7/1; intake nurse still points out odor and drainage. Last week I did a deep tissue culture of the right foot this grew a few  methicillin resistant staph aureus, few group B strep and a few Pseudomonas. No anaerobes were  isolated. He will require a course of 2 different antibiotics probably ciprofloxacin and doxycycline. As usual he has thick callus around the wound 7/15; copious amounts of callus. X-rays I did last time showed chronic changes versus possible osteomyelitis at the plantar aspect of the proximal metatarsal. We will therefore go ahead with the MRI. 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 We have been using silver alginate. 7/22; patient's MRI is not until 01/19/2020. Still copious amounts of thick callus subcutaneous tissue from around the wound margins. We have been using silver alginate but apparently home health is not actually putting these in the wounds especially the right with the undermining area laterally Electronic Signature(s) Signed: 01/06/2020 5:29:29 PM By: Linton Ham MD Entered By: Linton Ham on 01/06/2020 08:53:25 -------------------------------------------------------------------------------- Physical Exam Details Patient Name: Date of Service: [name], [name] 01/06/2020 8:00 A M Medical Record Number: [id number] Patient Account Number: [id number] Date of Birth/Sex: Treating RN: [birth year] (79 y.o. [name] Primary Care Provider: [name] Other Clinician: Referring Provider: Treating Provider/Extender: Gabriel Rainwater in Treatment: 24 Constitutional Sitting or standing Blood Pressure is within target range for patient.. Pulse regular and within target range for patient.Marland Kitchen Respirations regular, non-labored and within target range.. Temperature is normal and within the target range for the patient.Marland Kitchen Appears in no distress. Notes Wound exam No major change. Both of these have thick callus in the case on the right subcutaneous tissue around the wound circumference. The right one undermines laterally. I used a #5 curette to remove callus  subcutaneous tissue from around both wound margins in the case of the right I remove the skin overhanging the wound so it will be more obvious where home health actually needs to put the dressing. Electronic Signature(s) Signed: 01/06/2020 5:29:29 PM By: Linton Ham MD Entered By: Linton Ham on 01/06/2020 08:55:09 -------------------------------------------------------------------------------- Physician Orders Details Patient Name: Date of Service: [name], [name] 01/06/2020 8:00 A M Medical Record Number: [id number] Patient Account Number: [id number] Date of Birth/Sex: Treating RN: 1940/09/05 (79 y.o. [name] Primary Care Provider: [name] Other Clinician: Referring Provider: Treating Provider/Extender: Gabriel Rainwater in Treatment: 24 Verbal / Phone Orders: No Diagnosis Coding ICD-10 Coding Code Description E11.621 Type 2 diabetes mellitus with foot ulcer L97.522 Non-pressure chronic ulcer of other part of left foot with fat layer exposed L97.512 Non-pressure chronic ulcer of other part of right foot with fat layer exposed I73.89 Other specified peripheral vascular diseases L03.115 Cellulitis of right lower limb Follow-up Appointments Return Appointment in 2 weeks. Dressing Change Frequency Wound #21 Left,Plantar Foot Change dressing three times week. Wound #22 Right Metatarsal head first Change dressing three times week. Wound Cleansing Clean wound with Wound Cleanser - or normal saline Primary Wound Dressing Wound #21 Left,Plantar Foot Calcium Alginate with Silver Wound #22 Right Metatarsal head first lginate with Silver - ensure to pack into wound bed and any undermining Calcium A Secondary Dressing Wound #21 Left,Plantar Foot Foam - foam donut to offload Kerlix/Rolled Gauze Dry Gauze Wound #22 Right Metatarsal head first Foam - foam donut to offload Kerlix/Rolled Gauze Dry Gauze Edema  Control Avoid standing for long periods of time Elevate legs to the level of the heart or above for 30 minutes daily and/or when sitting, a frequency of: - throughout  the day. Off-Loading Other: - felt patient's personal shoes. patient to wear diabetic shoes. patient to limit about of time spent walking and standing. [facility] for wound care. - [facility] [facility], maintenance) Signed: 01/06/2020 5:02:14 PM By: Levan Hurst RN, BSN Signed: 01/06/2020 5:29:29 PM By: Linton Ham MD Entered By: Levan Hurst on 01/06/2020 08:43:12 -------------------------------------------------------------------------------- Problem List Details Patient Name: Date of Service: [name], [name] 01/06/2020 8:00 A M Medical Record Number: [id number] Patient Account Number: [id number] Date of Birth/Sex: Treating RN: 1941-05-09 (79 y.o. [name] Primary Care Provider: [name] Other Clinician: Referring Provider: Treating Provider/Extender: Gabriel Rainwater in Treatment: 24 Active Problems ICD-10 Encounter Code Description Active Date MDM Diagnosis E11.621 Type 2 diabetes mellitus with foot ulcer 07/21/2019 No Yes L97.522 Non-pressure chronic ulcer of other part of left foot with fat layer exposed 07/21/2019 No Yes L97.512 Non-pressure chronic ulcer of other part of right foot with fat layer exposed 07/21/2019 No Yes I73.89 Other specified peripheral vascular diseases 07/21/2019 No Yes L03.115 Cellulitis of right lower limb 12/16/2019 No Yes Inactive Problems ICD-10 Code Description Active Date Inactive Date F17.218 Nicotine dependence, cigarettes, with other nicotine-induced disorders 07/21/2019 07/21/2019 I10 Essential (primary) hypertension 07/21/2019 07/21/2019 L84 Corns and callosities 07/21/2019 07/21/2019 Resolved Problems Electronic Signature(s) Signed: 01/06/2020 5:29:29 PM By: Linton Ham MD Entered By: Linton Ham on 01/06/2020 08:51:46 -------------------------------------------------------------------------------- Progress Note Details Patient Name: Date of Service: [name], [name] 01/06/2020 8:00 A M Medical Record Number: [id number] Patient Account Number: [id number] Date of Birth/Sex: Treating RN: [birth year] (79 y.o. [name] Primary Care Provider: [name] Other Clinician: Referring Provider: Treating Provider/Extender: Gabriel Rainwater in Treatment: 24 Subjective History of Present Illness (HPI) very pleasant 79 year old gentleman has type 2 diabetes and has been having an ulcer on his foot for several years. Been seeing as this time around since the end of October. Classified as a Wagner grade 2 because his previous x-rays did not show any problems. He has an MRI pending this Friday. discussing with him he says he has not been able to get his diabetic shoes yet. He continues to smoke his pipe and drinks moonshine and says he will not give this up. I have tried to counsel him regarding this but he firmly says that he is happy to listen to me but he is not going to give up his habits. 08/17/14 -- patient has no fresh complaints but has recently come with his MRI which was done on 08/12/2014. 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 Readmission: 07/21/2019 upon evaluation today patient presents for initial inspection here in our clinic concerning issues that he has been having with his bilateral feet. He has open wounds that been present at least since the beginning of the year although he does not know an exact time. He is previously had amputations of all toes and the distal portion of his foot. 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 With that being said he does have a history of diabetes, peripheral vascular  disease, nicotine dependence, and hypertension. He has not had any recent arterial or vascular studies he was noncompressible today I think he is going to require referral for arterial studies as well. 2/11; patient was readmitted to our clinic last week. He has bilateral plantar foot wounds in the setting of  previous transmetatarsal amputations. He had his arterial studies done on 2/8; these were actually quite good. He was noncompressible on both sides however his waveforms were triphasic. The thought was noncompressible vessels consistent with medial calcification but without significant stenosis in the lower extremities. X-rays were done on the left this showed extensive chronic and postoperative changes without evidence of osteomyelitis. There was no evidence of osteomyelitis on the right foot there was mild vascular calcification MRI was suggested on the left based on clinical correlation. The patient arrives in clinic with some odor and drainage from the left foot. Right foot measures smaller. He is really not offloading these areas although he says he does not walk much 2/25; the patient arrives with some odor and drainage from the left foot again. Post debridement I cultured this area. He has the area on the midfoot roughly the third metatarsal head and the first metatarsal head on the right. He is in surgical shoes bilateral transmet 3/4; culture from last week showed abundant group B strep and a few Proteus. We will put him on cephalexin starting today. Still silver alginate to the wounds 3/11; due to issues with pharmacy he still does not have the cephalexin that I prescribed a week ago. Apparently this was called into a different pharmacy on Friday but they have not delivered it to him. We have been using silver alginate on the wounds but because of lack of moisture change to silver collagen today. We are offloading as best we can in surgical shoes 3/18; he is taking the cephalexin that I  prescribed 2 weeks ago. Using silver collagen. He has surgical shoes. Setting of bilateral previous transmetatarsal amputations. Wounds are mirror-image wounds medially. 3/25; mirror-image wounds in the setting of previous transmetatarsal amputation. The wounds are mirror image medial foot wounds. We have been using silver collagen. We are using surgical shoes offloaded with felt. He came in today asking about his "$900" custom-made shoes although after talking to them it does not appear that he ever really wore these and they are more than a year old 4/8; mirror-image wounds on the medial part of the plantar feet in the setting of previous transmetatarsal amputations. We have been using silver collagen. I aggressively debrided these 2 weeks ago removing callus subcutaneous tissue from around the wound margins. He did indeed bring echo he has custom-made shoes with transmetatarsal inserts. He has never worn these. He claims to be not spending a lot of time on his feet 4/15; mirror-image wounds on the plantar feet in the setting of previous transmetatarsal amputation. We have been using silver alginate. Culture that I did last time grew MRSA and Proteus. I have him on doxycycline. This was a deep tissue culture. The Proteus was not actively plated against doxycycline but was otherwise pansensitive I think the doxycycline would cover this. His wounds actually looks somewhat better. Rolled senescent edges still around the wound edges 4/29; mirror-image wounds on the plantar feet in the setting of previous bilateral transmetatarsal amputations we have been using silver alginate. Last time I saw him 2 weeks ago I gave him a course of doxycycline. Things seem to have cleaned up. This is in response to a deep tissue culture that showed MRSA and Proteus 5/6; wounds on the bilateral plantar feet in the setting of previous transmetatarsal amputations. We have been using silver alginate. Arrives in the clinic  today with the same raised thick callus and subcutaneous tissue. He has home health changing the dressing. He  brought up the fact that he has transportation through [facility] apparently he only has "12 visits". Otherwise he has the pay $40 for a cab ride there and back from his home which I guess [place] [address]. 11/11/19-Patient back with left transmet plantar surface much worse with extension of devitalized tissue proximally from the original wound with dense callus rim, the right is about the same, apparently patient was wearing tennis shoes at home per home health, although patient swears that he has been using his offloading devices that he has with inserts. 6/8; I have not seen this patient in over a month however he was seen last week in my absence. He has mirror-image wounds over his bilateral transmit plantar surfaces. He has been using silver alginate. He comes in today with literally a mountain of callus around the 2 wounds on the left. The area on the right somewhat better but still callus and nonviable tissue around the circular wound with some depth. He had an x-ray done last week of the left foot that was negative a culture showed multiple organisms. He did not get any antibiotics. He has custom-made shoes and apparently is getting another pair from [employer] that is in the process. I told him that we are not offloading the sufficiently for what ever reason. I do not think he is wearing his custom shoes at home although he says he is not walking that much 6/17 bilateral plantar foot in the setting of previous transmetatarsal amputations. He has been using silver alginate. He has his own custom made shoes which he is supposed to have it replaced in the next week or 2. We have been extensively debriding the callus around both wound sites and things look slightly better today 6/24; patient arrives in clinic today with her intake nurse pointing out odor and drainage. We have been  using silver alginate. I was going to put a total contact cast on this leg today but in view of the odor and drainage will postpone that 7/1; intake nurse still points out odor and drainage. Last week I did a deep tissue culture of the right foot this grew a few methicillin resistant staph aureus, few group B strep and a few Pseudomonas. No anaerobes were isolated. He will require a course of 2 different antibiotics probably ciprofloxacin and doxycycline. As usual he has thick callus around the wound 7/15; copious amounts of callus. X-rays I did last time showed chronic changes versus possible osteomyelitis at the plantar aspect of the proximal metatarsal. We will therefore go ahead with the MRI. 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 We have been using silver alginate. 7/22; patient's MRI is not until 01/19/2020. Still copious amounts of thick callus subcutaneous tissue from around the wound margins. We have been using silver alginate but apparently home health is not actually putting these in the wounds especially the right with the undermining area laterally Objective Constitutional Sitting or standing Blood Pressure is within target range for patient.. Pulse regular and within target range for patient.Marland Kitchen Respirations regular, non-labored and within target range.. Temperature is normal and within the target range for the patient.Marland Kitchen Appears in no distress. Vitals Time Taken: 8:13 AM, Height: 73 in, Source: Stated, Weight: 180 lbs, Source: Stated, BMI: 23.7, Temperature: 99 F, Pulse: 70 bpm, Respiratory Rate: 18 breaths/min, Blood Pressure: 108/57 mmHg, Capillary Blood Glucose: 100 mg/dl. General Notes: glucose per pt report yesterday General  Notes: Wound exam ooNo major change. Both of these have thick callus in the case on the right subcutaneous tissue around the wound circumference. The right one undermines laterally. I  used a #5 curette to remove callus subcutaneous tissue from around both wound margins in the case of the right I remove the skin overhanging the wound so it will be more obvious where home health actually needs to put the dressing. Integumentary (Hair, Skin) Wound #21 status is Open. Original cause of wound was Gradually Appeared. The wound is located on the [place]. The wound measures 0.4cm length x 0.4cm width x 0.5cm depth; 0.126cm^2 area and 0.063cm^3 volume. There is Fat Layer (Subcutaneous Tissue) Exposed exposed. There is undermining starting at 1:00 and ending at 9:00 with a maximum distance of 0.5cm. There is a medium amount of serosanguineous drainage noted. The wound margin is thickened. There is large (67-100%) pink granulation within the wound bed. There is no necrotic tissue within the wound bed. Wound #22 status is Open. Original cause of wound was Gradually Appeared. The wound is located on the Right Metatarsal head first. The wound measures 1.1cm length x 1.6cm width x 0.5cm depth; 1.382cm^2 area and 0.691cm^3 volume. There is Fat Layer (Subcutaneous Tissue) Exposed exposed. There is no tunneling or undermining noted. There is a medium amount of serosanguineous drainage noted. Foul odor after cleansing was noted. The wound margin is thickened. There is large (67-100%) red granulation within the wound bed. There is no necrotic tissue within the wound bed. 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 There was a Excisional Skin/Subcutaneous Tissue Debridement with a total area of 0.16 sq cm performed by Ricard Dillon., MD. With the following instrument(s): Curette to remove Viable and Non-Viable tissue/material. Material removed includes Callus and Subcutaneous Tissue and after achieving pain control using Lidocaine 5% topical ointment. No specimens were taken. A time out was conducted at 08:36, prior to the start of the procedure. A Minimum amount of bleeding was controlled with Pressure. The procedure was tolerated well with a pain level of 0 throughout and a pain level of 0 following the procedure. Post Debridement Measurements: 0.4cm length x 0.4cm width x 0.5cm depth; 0.063cm^3 volume. Character of Wound/Ulcer Post Debridement is improved. Severity of Tissue Post Debridement is: Fat layer exposed. 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 There was a Excisional Skin/Subcutaneous Tissue Debridement with a total area of 1.76 sq cm performed by Ricard Dillon., MD. With the following instrument(s): Curette to remove Viable and Non-Viable tissue/material. Material removed includes Callus and Subcutaneous Tissue and after achieving pain control using Lidocaine 5% topical ointment. No specimens were taken. A time out was conducted at 08:36, prior to the start of the procedure. A Minimum amount of bleeding was controlled with Pressure. The procedure was tolerated well with a pain level of 0 throughout and a pain  level of 0 following the procedure. Post Debridement Measurements: 1.1cm length x 1.6cm width x 0.5cm depth; 0.691cm^3 volume. Character of Wound/Ulcer Post Debridement is improved. Severity of Tissue Post Debridement is: Fat layer exposed. Post procedure  Diagnosis Wound #22: Same as Pre-Procedure Plan Follow-up Appointments: Return Appointment in 2 weeks. Dressing Change Frequency: Wound #21 Left,Plantar Foot: Change dressing three times week. Wound #22 Right Metatarsal head first: Change dressing three times week. Wound Cleansing: Clean wound with Wound Cleanser - or normal saline Primary Wound Dressing: Wound #21 Left,Plantar Foot: Calcium Alginate with Silver Wound #22 Right Metatarsal head first: Calcium Alginate with Silver - ensure to pack into wound bed and any undermining Secondary Dressing: Wound #21 Left,Plantar Foot: Foam - foam donut to offload Kerlix/Rolled Gauze Dry Gauze Wound #22 Right Metatarsal head first: Foam - foam donut to offload Kerlix/Rolled Gauze Dry Gauze Edema Control: Avoid standing for long periods of time Elevate legs to the level of the heart or above for 30 minutes daily and/or when sitting, a frequency of: - throughout the day. Off-Loading: Other: - felt patient's personal shoes. patient to wear diabetic shoes. patient to limit about of time spent walking and standing. Home Health: [place] [facility] for wound care. - [facility] Bilateral foot wounds which I think are largely nonhealing secondary to inadequate pressure relief. #2 I have aggressively debrided the 1 on the right including opening the undermining areas to allow for more obvious dressings to be applied. 3. His MRI is booked for 8/4 this is a follow-up to make sure that there is no underlying osteomyelitis on the right 4. Previous arterial studies have not suggested a significant stenosis. He has noncompressible vessels but no stenosis and triphasic waveforms. 5. If there is no osteomyelitis I might consider trying to cast him starting on the right side Electronic Signature(s) Signed: 01/06/2020 5:29:29 PM By: Linton Ham MD Entered By: Linton Ham on 01/06/2020  08:58:01 -------------------------------------------------------------------------------- SuperBill Details Patient Name: Date of Service: [name], [name] 01/06/2020 Medical Record Number: [id number] Patient Account Number: [id number] Date of Birth/Sex: Treating RN: 06-10-1941 (79 y.o. [name] Primary Care Provider: [name] Other Clinician: Referring Provider: Treating Provider/Extender: Gabriel Rainwater in Treatment: 24 Diagnosis Coding ICD-10 Codes Code Description E11.621 Type 2 diabetes mellitus with foot ulcer L97.522 Non-pressure chronic ulcer of other part of left foot with fat layer exposed L97.512 Non-pressure chronic ulcer of other part of right foot with fat layer exposed I73.89 Other specified peripheral vascular diseases L03.115 Cellulitis of right lower limb Facility Procedures The patient participates with Medicare or their insurance follows the Medicare Facility Guidelines: CPT4 Code Description Modifier Quantity [id number] - DEB SUBQ TISSUE 20 SQ CM/< 1 ICD-10 Diagnosis Description E11.621 Type 2 diabetes mellitus with  foot ulcer L97.512 Non-pressure chronic ulcer of other part of right foot with fat layer exposed L97.522 Non-pressure chronic ulcer of other part of left foot with fat layer exposed Physician Procedures : CPT4 Code Description Modifier [id number] - WC PHYS SUBQ TISS 20 SQ CM ICD-10 Diagnosis Description E11.621 Type 2 diabetes mellitus with foot ulcer L97.512 Non-pressure chronic ulcer of other part of right foot with fat layer exposed L97.522  Non-pressure chronic ulcer of other part of left foot with fat layer exposed Quantity: 1 Electronic Signature(s) Signed: 01/06/2020 5:29:29 PM By: Linton Ham MD Entered By: Linton Ham on 01/06/2020 08:58:25

## 2020-01-19 ENCOUNTER — Ambulatory Visit (HOSPITAL_COMMUNITY): Payer: Medicare Other

## 2020-01-19 ENCOUNTER — Encounter (HOSPITAL_COMMUNITY): Payer: Self-pay

## 2020-01-20 ENCOUNTER — Encounter (HOSPITAL_BASED_OUTPATIENT_CLINIC_OR_DEPARTMENT_OTHER): Payer: Medicare Other | Admitting: Physician Assistant

## 2020-01-28 ENCOUNTER — Encounter (HOSPITAL_BASED_OUTPATIENT_CLINIC_OR_DEPARTMENT_OTHER): Payer: Medicare Other | Attending: Internal Medicine | Admitting: Internal Medicine

## 2020-02-05 ENCOUNTER — Encounter (HOSPITAL_COMMUNITY): Payer: Self-pay

## 2020-02-05 ENCOUNTER — Emergency Department (HOSPITAL_COMMUNITY)
Admission: EM | Admit: 2020-02-05 | Discharge: 2020-02-05 | Disposition: A | Discharge status: Home / Self Care | Payer: Medicare Other | Attending: Emergency Medicine | Admitting: Emergency Medicine

## 2020-02-05 ENCOUNTER — Emergency Department (HOSPITAL_COMMUNITY): Payer: Medicare Other

## 2020-02-05 DIAGNOSIS — I10 Essential (primary) hypertension: Secondary | ICD-10-CM | POA: Diagnosis not present

## 2020-02-05 DIAGNOSIS — E13319 Other specified diabetes mellitus with unspecified diabetic retinopathy without macular edema: Secondary | ICD-10-CM

## 2020-02-05 DIAGNOSIS — Z8673 Personal history of transient ischemic attack (TIA), and cerebral infarction without residual deficits: Secondary | ICD-10-CM | POA: Insufficient documentation

## 2020-02-05 DIAGNOSIS — E1140 Type 2 diabetes mellitus with diabetic neuropathy, unspecified: Secondary | ICD-10-CM | POA: Insufficient documentation

## 2020-02-05 DIAGNOSIS — R030 Elevated blood-pressure reading, without diagnosis of hypertension: Secondary | ICD-10-CM

## 2020-02-05 DIAGNOSIS — F17290 Nicotine dependence, other tobacco product, uncomplicated: Secondary | ICD-10-CM | POA: Insufficient documentation

## 2020-02-05 DIAGNOSIS — Z7984 Long term (current) use of oral hypoglycemic drugs: Secondary | ICD-10-CM | POA: Diagnosis not present

## 2020-02-05 DIAGNOSIS — Z79899 Other long term (current) drug therapy: Secondary | ICD-10-CM | POA: Insufficient documentation

## 2020-02-05 DIAGNOSIS — H538 Other visual disturbances: Secondary | ICD-10-CM | POA: Diagnosis present

## 2020-02-05 DIAGNOSIS — E11311 Type 2 diabetes mellitus with unspecified diabetic retinopathy with macular edema: Secondary | ICD-10-CM | POA: Insufficient documentation

## 2020-02-05 DIAGNOSIS — H4310 Vitreous hemorrhage, unspecified eye: Secondary | ICD-10-CM | POA: Diagnosis not present

## 2020-02-05 DIAGNOSIS — I252 Old myocardial infarction: Secondary | ICD-10-CM | POA: Insufficient documentation

## 2020-02-05 LAB — CBC
HCT: 40 % (ref 39.0–52.0)
Hemoglobin: 13 g/dL (ref 13.0–17.0)
MCH: 28.8 pg (ref 26.0–34.0)
MCHC: 32.5 g/dL (ref 30.0–36.0)
MCV: 88.7 fL (ref 80.0–100.0)
Platelets: 245 10*3/uL (ref 150–400)
RBC: 4.51 MIL/uL (ref 4.22–5.81)
RDW: 16.1 % — ABNORMAL HIGH (ref 11.5–15.5)
WBC: 6.9 10*3/uL (ref 4.0–10.5)
nRBC: 0 % (ref 0.0–0.2)

## 2020-02-05 LAB — COMPREHENSIVE METABOLIC PANEL
ALT: 11 U/L (ref 0–44)
AST: 13 U/L — ABNORMAL LOW (ref 15–41)
Albumin: 3.7 g/dL (ref 3.5–5.0)
Alkaline Phosphatase: 44 U/L (ref 38–126)
Anion gap: 10 (ref 5–15)
BUN: 9 mg/dL (ref 8–23)
CO2: 23 mmol/L (ref 22–32)
Calcium: 9 mg/dL (ref 8.9–10.3)
Chloride: 107 mmol/L (ref 98–111)
Creatinine, Ser: 1.19 mg/dL (ref 0.61–1.24)
GFR calc Af Amer: >60 mL/min (ref >60)
GFR calc non Af Amer: 59 mL/min — ABNORMAL LOW (ref >60)
Glucose, Bld: 91 mg/dL (ref 70–99)
Potassium: 4.5 mmol/L (ref 3.5–5.1)
Sodium: 140 mmol/L (ref 135–145)
Total Bilirubin: 0.8 mg/dL (ref 0.3–1.2)
Total Protein: 7.9 g/dL (ref 6.5–8.1)

## 2020-02-05 MED ORDER — TETRACAINE HCL 0.5 % OP SOLN
2.0000 [drp] | Freq: Once | OPHTHALMIC | Status: AC
  Administered 2020-02-05: 2 [drp] via OPHTHALMIC
  Filled 2020-02-05: qty 4

## 2020-02-05 NOTE — Discharge Instructions (Addendum)
It was our pleasure to provide your ER care today - we hope that you feel better.  Keep head elevated, elevate head of bed.   Continue medication as prescribed by your eye doctor.   Follow up with the eye specialist/retina specialist Monday - call office Monday morning at 8 AM to arrange appointment.   Also, your blood pressure is high today - follow up with primary care doctor in the next 1-2 weeks.   Return to ER if worse, new symptoms, new or severe eye pain, acute change in vision, numbness/weakness, or other concern.

## 2020-02-05 NOTE — ED Triage Notes (Signed)
He tells [place] that his vision is "blurry" today. He also states that, earlier today, his vision went "dark for a while", but the darkness has improved, however, he tells me that his vision continues to be "blurrier than usual" [sic]. He states he last saw his ophthalmologist (he has known glaucoma) about a month ago, and was given a "new prescription for eye drops" at that time.

## 2020-02-05 NOTE — ED Provider Notes (Addendum)
[facility] DEPT Provider Note   CSN: [id number] Arrival date & time: 02/05/20  1412     History Chief Complaint  Patient presents with  . Eye Problem    Alven Alverio is a 79 y.o. male.  Patient with hx diabetes, glaucoma, c/o diffusely blurry vision with both eyes since yesterday afternoon. Symptoms acute onset, moderate, persistent, painless. States seems equally blurry with each eye and both eyes. Denies any eye pain. No headaches. No eye redness, drainage or tearing. States compliant w home meds. No visual field cut or amaurosis. No change in speech. No aphasia. Denies numbness or weakness. No new problems with balance, coordination, gait or functional ability.   The history is provided by the patient.  Eye Problem Associated symptoms: no headaches, no nausea, no numbness, no redness, no vomiting and no weakness        Past Medical History:  Diagnosis Date  . Arthritis   . Diabetic neuropathy ([place])   . High cholesterol   . Hypertension    denies  . Osteomyelitis (H[facility])    Right great toe  . Peripheral vascular disease ([place])    diabetic  with osteomylitis  . 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 Type II diabetes mellitus [facility])     Patient Active Problem List   Diagnosis Date Noted  . Midfoot ulcer, left, limited to breakdown of skin ([place]) 12/29/2018  . Occipital stroke ([place]) 10/31/2018  . DM2 (diabetes mellitus, type 2) ([place]) 10/31/2018  . NSTEMI (non-ST elevated myocardial infarction) ([place]) 05/28/2018  . Syncope 05/28/2018  . HTN (hypertension) 05/28/2018  . Hyperlipemia, mixed 05/28/2018  . Rash and nonspecific skin eruption 12/16/2017  . Pain in right hand 09/11/2017  . Carpal tunnel syndrome, right upper limb 07/01/2017  . Arthritis of carpometacarpal [facility]) joint of left thumb 12/30/2016  . Midfoot skin ulcer, right, limited to breakdown of skin ([place]) 07/22/2016  . 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  Osteomyelitis of ankle or foot, left, acute ([place]) 08/23/2014  . Cellulitis 10/12/2012  . Chest pain 10/12/2012  . Tobacco abuse 10/12/2012  . ABSCESS, AXILLA, LEFT 02/23/2008  . 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 CHEST WALL PAIN, ACUTE 12/03/2007  . CANDIDIASIS, GLANS PENIS 09/16/2007  . HEADACHE 08/04/2007  . 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 Other and unspecified hyperlipidemia 03/05/2007  . GOUT 03/05/2007  . ERECTILE DYSFUNCTION 03/05/2007  . EXTERNAL HEMORRHOIDS 03/05/2007  . VENTRAL HERNIA 03/05/2007  . FATTY LIVER DISEASE 03/05/2007  . Osteoarthritis 03/05/2007  . HEMORRHOIDS, INTERNAL 03/17/2005  . COLONIC POLYPS, HX OF 03/17/2005    Past Surgical History:  Procedure Laterality Date  . AMPUTATION  07/23/2011   Procedure: AMPUTATION DIGIT;  Surgeon: Newt Minion, MD;  Location: [place];  Service: Orthopedics;  Laterality: Right;  Right Great Toe Amputation  . AMPUTATION Right 09/18/2012   Procedure: Right Foot 2nd Ray Amputation;  Surgeon: Newt Minion, MD;  Location: [place];  Service: Orthopedics;  Laterality: Right;  Right Foot 2nd Ray Amputation  . AMPUTATION Right 10/14/2012   Procedure: AMPUTATION FOOT;  Surgeon: Newt Minion, MD;  Location: [place];  Service: Orthopedics;  Laterality: Right;  Right Foot Transmetatarsal Amputation  . AMPUTATION Left 09/23/2014   Procedure: Left Foot Transmetatarsal Amputation;  Surgeon: Newt Minion, MD;  Location: [facility];  Service: Orthopedics;  Laterality: Left;  . BACK SURGERY  lower  . CARPAL TUNNEL RELEASE Right 01/30/2018   Procedure: RIGHT CARPAL TUNNEL RELEASE;  Surgeon: Newt Minion, MD;  Location: [facility] [facility];  Service: Orthopedics;  Laterality: Right;  . CIRCUMCISION  2010  . COLONOSCOPY    . FOOT FRACTURE SURGERY Left 1970's   "broke it playing football" (10/12/2012)  . HUMERUS FRACTURE SURGERY W/ IMPLANT Right 1960's   "put a plate in it"  (0/17/4944)  . INCISION AND DRAINAGE OF WOUND Left 2006   "foot" (10/12/2012)  . [place] SURGERY  2008       Family History  Problem Relation Age of Onset  . Diabetes Mellitus II Other   . Anesthesia problems Neg Hx     Social History   Tobacco Use  . Smoking status: Current Every Day Smoker    Years: 12.00    Types: Pipe  . Smokeless tobacco: Never Used  Vaping Use  . Vaping Use: Never used  Substance Use Topics  . Alcohol use: No    Alcohol/week: 0.0 standard drinks  . Drug use: No    Home Medications Prior to Admission medications   Medication Sig Start Date End Date Taking? Authorizing Provider  acetaminophen (TYLENOL) 500 MG tablet Take 1,000 mg by mouth every 6 (six) hours as needed for mild pain.    [provider]  ALPHAGAN P 0.1 % SOLN Place 1 drop into both eyes 2 (two) times daily.  12/31/16   [provider]  atorvastatin (LIPITOR) 20 MG tablet Take 20 mg by mouth daily. 08/31/18   [provider]  clopidogrel (PLAVIX) 75 MG tablet Take 1 tablet (75 mg total) by mouth daily. 11/01/18   Mariel Aloe, MD  latanoprost (XALATAN) 0.005 % ophthalmic solution Place 1 drop into both eyes at bedtime.  09/12/14   [provider]  metFORMIN (GLUCOPHAGE) 1000 MG tablet Take 1,000 mg by mouth 2 (two) times daily.     [provider]  pregabalin (LYRICA) 50 MG capsule Take 1 capsule (50 mg total) by mouth 3 (three) times daily. 12/07/19   Suzan Slick, NP  pyridOXINE (VITAMIN B-6) 50 MG tablet Take 50 mg by mouth daily.     [provider]  thiamine (VITAMIN B-1) 100 MG tablet Take 100 mg by mouth daily.    [provider]  timolol (TIMOPTIC) 0.5 % ophthalmic solution Place 1 drop into both eyes 2 (two) times daily. 05/19/17   [provider]  vitamin B-12 (CYANOCOBALAMIN) 500 MCG tablet Take 500 mcg by mouth daily.    [provider]    Allergies    Patient has no known allergies.  Review  of Systems   Review of Systems  Constitutional: Negative for fever.  HENT: Negative for sore throat.   Eyes: Positive for visual disturbance. Negative for pain and redness.  Respiratory: Negative for cough and shortness of breath.   Cardiovascular: Negative for chest pain.  Gastrointestinal: Negative for abdominal pain, nausea and vomiting.  Genitourinary: Negative for flank pain.  Musculoskeletal: Negative for back pain and neck pain.  Skin: Negative for rash.  Neurological: Negative for speech difficulty, weakness, numbness and headaches.  Hematological: Does not bruise/bleed easily.  Psychiatric/Behavioral: Negative for confusion.    Physical Exam Updated Vital Signs BP 130/78 (BP Location: Left Arm)   Pulse (!) 54   Temp 98.6 F (37 C) (Oral)   Resp 16   SpO2 100%   Physical Exam Vitals and nursing note reviewed.  Constitutional:  Appearance: Normal appearance. He is well-developed.  HENT:     Head: Atraumatic.     Nose: Nose normal.     Mouth/Throat:     Mouth: Mucous membranes are moist.     Pharynx: Oropharynx is clear.  Eyes:     General: No scleral icterus.       Right eye: No discharge.        Left eye: No discharge.     Extraocular Movements: Extraocular movements intact.     Conjunctiva/sclera: Conjunctivae normal.     Pupils: Pupils are equal, round, and reactive to light.     Comments: ?vitreous hem. Conj not injected. No corneal clouding. Pupils briskly reactive. Eomi, no pain w eom. No orbital or periorbital cellulitis.   Neck:     Vascular: No carotid bruit.     Trachea: No tracheal deviation.  Cardiovascular:     Rate and Rhythm: Normal rate and regular rhythm.     Pulses: Normal pulses.     Heart sounds: Normal heart sounds. No murmur heard.  No friction rub. No gallop.   Pulmonary:     Effort: Pulmonary effort is normal. No accessory muscle usage or respiratory distress.     Breath sounds: Normal breath sounds.  Abdominal:     General:  Bowel sounds are normal. There is no distension.     Palpations: Abdomen is soft.     Tenderness: There is no abdominal tenderness. There is no guarding.  Genitourinary:    Comments: No cva tenderness. Musculoskeletal:        General: No swelling.     Cervical back: Normal range of motion and neck supple. No rigidity.  Skin:    General: Skin is warm and dry.     Findings: No rash.  Neurological:     Mental Status: He is alert.     Comments: Alert, speech clear. No aphasia. Motor intact bil, stre 5/5. No pronator drift. Sens grossly intact bil. Steady gait w cane. Pt notes diffusely decreased/blurry vision with both eyes and each eye. Unable to count fingers, but can follow finger/hand with EOM testing.   Psychiatric:        Mood and Affect: Mood normal.     ED Results / Procedures / Treatments   Labs (all labs ordered are listed, but only abnormal results are displayed) Results for orders placed or performed during the hospital encounter of 02/05/20  CBC  Result Value Ref Range   WBC 6.9 4.0 - 10.5 K/uL   RBC 4.51 4.22 - 5.81 MIL/uL   Hemoglobin 13.0 13.0 - 17.0 g/dL   HCT 40.0 39 - 52 %   MCV 88.7 80.0 - 100.0 fL   MCH 28.8 26.0 - 34.0 pg   MCHC 32.5 30.0 - 36.0 g/dL   RDW 16.1 (H) 11.5 - 15.5 %   Platelets 245 150 - 400 K/uL   nRBC 0.0 0.0 - 0.2 %  Comprehensive metabolic panel  Result Value Ref Range   Sodium 140 135 - 145 mmol/L   Potassium 4.5 3.5 - 5.1 mmol/L   Chloride 107 98 - 111 mmol/L   CO2 23 22 - 32 mmol/L   Glucose, Bld 91 70 - 99 mg/dL   BUN 9 8 - 23 mg/dL   Creatinine, Ser 1.19 0.61 - 1.24 mg/dL   Calcium 9.0 8.9 - 10.3 mg/dL   Total Protein 7.9 6.5 - 8.1 g/dL   Albumin 3.7 3.5 - 5.0 g/dL   AST 13 (  L) 15 - 41 U/L   ALT 11 0 - 44 U/L   Alkaline Phosphatase 44 38 - 126 U/L   Total Bilirubin 0.8 0.3 - 1.2 mg/dL   GFR calc non Af Amer 59 (L) >60 mL/min   GFR calc Af Amer >60 >60 mL/min   Anion gap 10 5 - 15   MR BRAIN WO CONTRAST  Result Date:  02/05/2020 CLINICAL DATA:  Acute visual disturbance with blurred vision over the last day. EXAM: MRI HEAD WITHOUT CONTRAST TECHNIQUE: Multiplanar, multiecho pulse sequences of the brain and surrounding structures were obtained without intravenous contrast. COMPARISON:  11/21/2018 FINDINGS: Brain: Diffusion imaging does not show any acute or subacute infarction. No focal abnormality affects the brainstem or cerebellum. Cerebral hemispheres show generalized atrophy with mild chronic small-vessel ischemic change of the white matter. Old small right posterior frontal cortical infarction. No mass lesion, hemorrhage, hydrocephalus or extra-axial collection. Vascular: Major vessels at the base of the brain show flow. Skull and upper cervical spine: Negative Sinuses/Orbits: Clear/normal Other: None IMPRESSION: No acute finding. Age related atrophy. Mild chronic small-vessel ischemic change of the cerebral hemispheric white matter. Old small right posterior frontal cortical infarction. Electronically Signed   By: Nelson Chimes M.D.   On: 02/05/2020 17:50    EKG None  Radiology MR BRAIN WO CONTRAST  Result Date: 02/05/2020 CLINICAL DATA:  Acute visual disturbance with blurred vision over the last day. EXAM: MRI HEAD WITHOUT CONTRAST TECHNIQUE: Multiplanar, multiecho pulse sequences of the brain and surrounding structures were obtained without intravenous contrast. COMPARISON:  11/21/2018 FINDINGS: Brain: Diffusion imaging does not show any acute or subacute infarction. No focal abnormality affects the brainstem or cerebellum. Cerebral hemispheres show generalized atrophy with mild chronic small-vessel ischemic change of the white matter. Old small right posterior frontal cortical infarction. No mass lesion, hemorrhage, hydrocephalus or extra-axial collection. Vascular: Major vessels at the base of the brain show flow. Skull and upper cervical spine: Negative Sinuses/Orbits: Clear/normal Other: None IMPRESSION: No  acute finding. Age related atrophy. Mild chronic small-vessel ischemic change of the cerebral hemispheric white matter. Old small right posterior frontal cortical infarction. Electronically Signed   By: Nelson Chimes M.D.   On: 02/05/2020 17:50    Procedures Procedures (including critical care time)  Medications Ordered in ED Medications  tetracaine (PONTOCAINE) 0.5 % ophthalmic solution 2 drop (has no administration in time range)    ED Course  I have reviewed the triage vital signs and the nursing notes.  Pertinent labs & imaging results that were available during my care of the patient were reviewed by me and considered in my medical decision making (see chart for details).    MDM Rules/Calculators/A&P                         Labs and imaging ordered.  Reviewed nursing notes and prior charts for additional history.  Pt with recent visit to [facility] with plan to see retinal specialist in [place], Dr Baird Cancer (re: vitreous hemorrhage)  - pt indicates has not yet seem.   Visual acuity per nursing (not pt says at baseline vision is not clear, and does not have corrective lenses with him).  IOP right 20. IOP left 14 ([place]).   Symptoms (bilateral blurring of vision) has been present/constant for ~ past 24 hours, without acute or abrupt change or worsening today.   MRI reviewed/interpreted by me - no acute cva.   Labs reviewed/interpreted by me -  chem normal.   Recheck, pt comfortable, no pain or discomfort. Currently appears stable for d/c. Rec f/u ophthy Monday.  Return precautions provided.    Final Clinical Impression(s) / ED Diagnoses Final diagnoses:  None    Rx / DC Orders ED Discharge Orders    None           Lajean Saver, MD 02/05/20 1815

## 2020-02-05 NOTE — ED Notes (Signed)
Pt is in MRI  

## 2020-02-08 ENCOUNTER — Other Ambulatory Visit: Payer: Self-pay | Admitting: Family

## 2020-02-08 ENCOUNTER — Encounter (HOSPITAL_BASED_OUTPATIENT_CLINIC_OR_DEPARTMENT_OTHER): Payer: Medicare Other | Admitting: Internal Medicine

## 2020-02-10 ENCOUNTER — Other Ambulatory Visit: Payer: Self-pay

## 2020-02-10 ENCOUNTER — Encounter (HOSPITAL_BASED_OUTPATIENT_CLINIC_OR_DEPARTMENT_OTHER): Payer: Medicare Other | Attending: Internal Medicine | Admitting: Internal Medicine

## 2020-02-10 DIAGNOSIS — L03115 Cellulitis of right lower limb: Secondary | ICD-10-CM | POA: Insufficient documentation

## 2020-02-10 DIAGNOSIS — E11621 Type 2 diabetes mellitus with foot ulcer: Secondary | ICD-10-CM | POA: Insufficient documentation

## 2020-02-10 DIAGNOSIS — L97512 Non-pressure chronic ulcer of other part of right foot with fat layer exposed: Secondary | ICD-10-CM | POA: Insufficient documentation

## 2020-02-10 DIAGNOSIS — I10 Essential (primary) hypertension: Secondary | ICD-10-CM | POA: Diagnosis not present

## 2020-02-10 DIAGNOSIS — F17200 Nicotine dependence, unspecified, uncomplicated: Secondary | ICD-10-CM | POA: Diagnosis not present

## 2020-02-10 DIAGNOSIS — L97522 Non-pressure chronic ulcer of other part of left foot with fat layer exposed: Secondary | ICD-10-CM | POA: Insufficient documentation

## 2020-02-10 DIAGNOSIS — E1151 Type 2 diabetes mellitus with diabetic peripheral angiopathy without gangrene: Secondary | ICD-10-CM | POA: Diagnosis not present

## 2020-02-10 NOTE — Progress Notes (Signed)
[name] ([id number]) Visit Report for 02/10/2020 Debridement Details Patient Name: Date of Service: [name], [place] [name] 02/10/2020 9:15 A M Medical Record Number: [id number] Patient Account Number: [id number] Date of Birth/Sex: Treating RN: 09-10-1940 (79 y.o. [name] Primary Care Provider: Leeroy [name] Other Clinician: Referring Provider: Treating Provider/Extender: [name] in Treatment: 29 Debridement Performed for Assessment: Wound #21 [place] Performed By: Physician [name]., MD Debridement Type: Debridement Severity of Tissue Pre Debridement: Fat layer exposed Level of Consciousness (Pre-procedure): Awake and Alert Pre-procedure Verification/Time Out Yes - 09:40 Taken: Start Time: 09:40 T Area Debrided (L x W): otal 0.3 (cm) x 0.4 (cm) = 0.12 (cm) Tissue and other material debrided: Non-Viable, Callus, Subcutaneous, Skin: Epidermis Level: Skin/Subcutaneous Tissue Debridement Description: Excisional Instrument: Blade Bleeding: Moderate Hemostasis Achieved: Silver Nitrate End Time: 09:42 Procedural Pain: 0 Post Procedural Pain: 0 Response to Treatment: Procedure was tolerated well Level of Consciousness (Post- Awake and Alert procedure): Post Debridement Measurements of Total Wound Length: (cm) 0.3 Width: (cm) 0.4 Depth: (cm) 0.6 Volume: (cm) 0.057 Character of Wound/Ulcer Post Debridement: Improved Severity of Tissue Post Debridement: Fat layer exposed Post Procedure Diagnosis Same as Pre-procedure Electronic Signature(s) Signed: 02/10/2020 4:47:33 PM By: Casey Hurst RN, BSN Signed: 02/10/2020 6:08:30 PM By: Casey Ham MD Entered By: [name] on 02/10/2020 10:56:59 -------------------------------------------------------------------------------- Debridement Details Patient Name: Date of Service: [name] 02/10/2020 9:15 A M Medical Record Number:  [id number] Patient Account Number: [id number] Date of Birth/Sex: Treating RN: [birth year] (79 y.o. [name] Primary Care Provider: Leeroy [name] Other Clinician: Referring Provider: Treating Provider/Extender: [name] in Treatment: 29 Debridement Performed for Assessment: Wound #22 Right Metatarsal head first Performed By: Physician [name]., MD Debridement Type: Debridement Severity of Tissue Pre Debridement: Fat layer exposed Level of Consciousness (Pre-procedure): Awake and Alert Pre-procedure Verification/Time Out Yes - 09:40 Taken: Start Time: 09:40 T Area Debrided (L x W): otal 0.9 (cm) x 1.1 (cm) = 0.99 (cm) Tissue and other material debrided: Non-Viable, Callus, Subcutaneous, Skin: Epidermis Level: Skin/Subcutaneous Tissue Debridement Description: Excisional Instrument: Blade Bleeding: Minimum Hemostasis Achieved: Pressure End Time: 09:42 Procedural Pain: 0 Post Procedural Pain: 0 Response to Treatment: Procedure was tolerated well Level of Consciousness (Post- Awake and Alert procedure): Post Debridement Measurements of Total Wound Length: (cm) 0.9 Width: (cm) 1.1 Depth: (cm) 0.5 Volume: (cm) 0.389 Character of Wound/Ulcer Post Debridement: Improved Severity of Tissue Post Debridement: Fat layer exposed Post Procedure Diagnosis Same as Pre-procedure Electronic Signature(s) Signed: 02/10/2020 4:47:33 PM By: Casey Hurst RN, BSN Signed: 02/10/2020 6:08:30 PM By: Casey Ham MD Entered By: [name] on 02/10/2020 10:57:11 -------------------------------------------------------------------------------- HPI Details Patient Name: Date of Service: [name] 02/10/2020 9:15 A M Medical Record Number: [id number] Patient Account Number: [id number] Date of Birth/Sex: Treating RN: December 11, 1940 (79 y.o. [name] Primary Care Provider: Leeroy [name] Other Clinician: Referring  Provider: Treating Provider/Extender: [name] in Treatment: 29 History of Present Illness HPI Description: very pleasant 79 year old gentleman has type 2 diabetes and has been having an ulcer on his foot for several years. Been seeing as this time around since the end of October. Classified as a Wagner grade 2 because his previous x-rays did not show any problems. He has an MRI pending this Friday. discussing with him he says he has not been able to get his diabetic shoes yet. He continues to smoke his pipe  and drinks moonshine and says he will not give this up. I have tried to counsel him regarding this but he firmly says that he is happy to listen to me but he is not going to give up his habits. 08/17/14 -- patient has no fresh complaints but has recently come with his MRI which was done on 08/12/2014. 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 Readmission: 07/21/2019 upon evaluation today patient presents for initial inspection here in our clinic concerning issues that he has been having with his bilateral feet. He has open wounds that been present at least since the beginning of the year although he does not know an exact time. He is previously had amputations of all toes and the distal portion of his foot. 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 With that being said he does have a history of diabetes, peripheral vascular disease, nicotine dependence, and hypertension. He has not had any recent arterial or vascular studies he was noncompressible today I think he is going to require referral for arterial studies as well. 2/11; patient was readmitted to our clinic last week. He has bilateral plantar foot wounds in the setting of previous transmetatarsal amputations. He had his arterial studies done on 2/8; these were actually quite  good. He was noncompressible on both sides however his waveforms were triphasic. The thought was noncompressible vessels consistent with medial calcification but without significant stenosis in the lower extremities. X-rays were done on the left this showed extensive chronic and postoperative changes without evidence of osteomyelitis. There was no evidence of osteomyelitis on the right foot there was mild vascular calcification MRI was suggested on the left based on clinical correlation. The patient arrives in clinic with some odor and drainage from the left foot. Right foot measures smaller. He is really not offloading these areas although he says he does not walk much 2/25; the patient arrives with some odor and drainage from the left foot again. Post debridement I cultured this area. He has the area on the midfoot roughly the third metatarsal head and the first metatarsal head on the right. He is in surgical shoes bilateral transmet 3/4; culture from last week showed abundant group B strep and a few Proteus. We will put him on cephalexin starting today. Still silver alginate to the wounds 3/11; due to issues with pharmacy he still does not have the cephalexin that I prescribed a week ago. Apparently this was called into a different pharmacy on Friday but they have not delivered it to him. We have been using silver alginate on the wounds but because of lack of moisture change to silver collagen today. We are offloading as best we can in surgical shoes 3/18; he is taking the cephalexin that I prescribed 2 weeks ago. Using silver collagen. He has surgical shoes. Setting of bilateral previous transmetatarsal amputations. Wounds are mirror-image wounds medially. 3/25; mirror-image wounds in the setting of previous transmetatarsal amputation. The wounds are mirror image medial foot wounds. We have been using silver collagen. We are using surgical shoes offloaded with felt. He came in today asking about  his "$900" custom-made shoes although after talking to them it does not appear that he ever really wore these and they are more than a year old 4/8; mirror-image wounds on the medial part of the plantar feet in the setting of previous transmetatarsal amputations. We have been using silver collagen.  I aggressively debrided these 2 weeks ago removing callus subcutaneous tissue from around the wound margins. He did indeed bring echo he has custom-made shoes with transmetatarsal inserts. He has never worn these. He claims to be not spending a lot of time on his feet 4/15; mirror-image wounds on the plantar feet in the setting of previous transmetatarsal amputation. We have been using silver alginate. Culture that I did last time grew MRSA and Proteus. I have him on doxycycline. This was a deep tissue culture. The Proteus was not actively plated against doxycycline but was otherwise pansensitive I think the doxycycline would cover this. His wounds actually looks somewhat better. Rolled senescent edges still around the wound edges 4/29; mirror-image wounds on the plantar feet in the setting of previous bilateral transmetatarsal amputations we have been using silver alginate. Last time I saw him 2 weeks ago I gave him a course of doxycycline. Things seem to have cleaned up. This is in response to a deep tissue culture that showed MRSA and Proteus 5/6; wounds on the bilateral plantar feet in the setting of previous transmetatarsal amputations. We have been using silver alginate. Arrives in the clinic today with the same raised thick callus and subcutaneous tissue. He has home health changing the dressing. He brought up the fact that he has transportation through [facility] apparently he only has "12 visits". Otherwise he has the pay $40 for a cab ride there and back from his home which I guess [place]. 11/11/19-Patient back with left transmet plantar surface much worse with extension of  devitalized tissue proximally from the original wound with dense callus rim, the right is about the same, apparently patient was wearing tennis shoes at home per home health, although patient swears that he has been using his offloading devices that he has with inserts. 6/8; I have not seen this patient in over a month however he was seen last week in my absence. He has mirror-image wounds over his bilateral transmit plantar surfaces. He has been using silver alginate. He comes in today with literally a mountain of callus around the 2 wounds on the left. The area on the right somewhat better but still callus and nonviable tissue around the circular wound with some depth. He had an x-ray done last week of the left foot that was negative a culture showed multiple organisms. He did not get any antibiotics. He has custom-made shoes and apparently is getting another pair from [employer] that is in the process. I told him that we are not offloading the sufficiently for what ever reason. I do not think he is wearing his custom shoes at home although he says he is not walking that much 6/17 bilateral plantar foot in the setting of previous transmetatarsal amputations. He has been using silver alginate. He has his own custom made shoes which he is supposed to have it replaced in the next week or 2. We have been extensively debriding the callus around both wound sites and things look slightly better today 6/24; patient arrives in clinic today with her intake nurse pointing out odor and drainage. We have been using silver alginate. I was going to put a total contact cast on this leg today but in view of the odor and drainage will postpone that 7/1; intake nurse still points out odor and drainage. Last week I did a deep tissue culture of the right foot this grew a few methicillin resistant staph aureus, few group B strep and  a few Pseudomonas. No anaerobes were isolated. He will require a course of 2 different  antibiotics probably ciprofloxacin and doxycycline. As usual he has thick callus around the wound 7/15; copious amounts of callus. X-rays I did last time showed chronic changes versus possible osteomyelitis at the plantar aspect of the proximal metatarsal. We will therefore go ahead with the MRI. 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 We have been using silver alginate. 7/22; patient's MRI is not until 01/19/2020. Still copious amounts of thick callus subcutaneous tissue from around the wound margins. We have been using silver alginate but apparently home health is not actually putting these in the wounds especially the right with the undermining area laterally 8/26; patient has not been here in over a month. He did not get his MRI done and he is not rebooked it. I am not exactly sure what he has been doing to these wounds on his bilateral feet. He comes in today with odor, thick callus around both wound areas. He is not really offloading this to any major degree. We have been using silver alginate Electronic Signature(s) Signed: 02/10/2020 6:08:30 PM By: Casey Ham MD Entered By: [name] on 02/10/2020 10:53:34 -------------------------------------------------------------------------------- Physical Exam Details Patient Name: Date of Service: [name] 02/10/2020 9:15 A M Medical Record Number: [id number] Patient Account Number: [id number] Date of Birth/Sex: Treating RN: 1940/12/15 (79 y.o. [name] Primary Care Provider: Leeroy [name] Other Clinician: Referring Provider: Treating Provider/Extender: [name] in Treatment: 29 Constitutional Sitting or standing Blood Pressure is within target range for patient.. . Pulse regular and within target range for patient.Marland Kitchen Respirations regular, non-labored and within target range.. Cardiovascular Pedal  pulses are palpable. Notes Wound exam The wounds are about the same as they were last time but considerable amounts of callus thick skin and subcutaneous tissue around the wound margins. I used pickups and a #10 scalpel to remove copious amounts of tissue from around the surface of these wounds I also noticed a large callus in his left midfoot that looked as though it could be preulcerative. I therefore attempted debridement here. During the process of doing this I inadvertently created a wound in the plantar aspect of his heel that we had the Steri-Stripped. Electronic Signature(s) Signed: 02/10/2020 6:08:30 PM By: Casey Ham MD Entered By: [name] on 02/10/2020 10:55:13 -------------------------------------------------------------------------------- Physician Orders Details Patient Name: Date of Service: [name] 02/10/2020 9:15 A M Medical Record Number: [id number] Patient Account Number: [id number] Date of Birth/Sex: Treating RN: 12-Mar-1941 (79 y.o. [name] Primary Care Provider: Leeroy [name] Other Clinician: Referring Provider: Treating Provider/Extender: [name] in Treatment: 42 Verbal / Phone Orders: No Diagnosis Coding ICD-10 Coding Code Description E11.621 Type 2 diabetes mellitus with foot ulcer L97.522 Non-pressure chronic ulcer of other part of left foot with fat layer exposed L97.512 Non-pressure chronic ulcer of other part of right foot with fat layer exposed I73.89 Other specified peripheral vascular diseases L03.115 Cellulitis of right lower limb Follow-up Appointments Return Appointment in 1 week. Dressing Change Frequency Wound #21 Left,Plantar Foot Change dressing three times week. Wound #22 Right Metatarsal head first Change dressing three times week. Wound Cleansing Clean wound with Wound Cleanser - or normal saline Primary Wound Dressing Wound #21 Left,Plantar Foot Calcium A  lginate with Silver Other: - steri strips to cut on plantar foot Wound #22  Right Metatarsal head first lginate with Silver - ensure to pack into wound bed and any undermining Calcium A Secondary Dressing Wound #21 Left,Plantar Foot Foam - foam donut to offload Kerlix/Rolled Gauze Dry Gauze Wound #22 Right Metatarsal head first Foam - foam donut to offload Kerlix/Rolled Gauze Dry Gauze Edema Control Avoid standing for long periods of time Elevate legs to the level of the heart or above for 30 minutes daily and/or when sitting, a frequency of: - throughout the day. Off-Loading Other: - felt patient's personal shoes. patient to wear diabetic shoes. patient to limit about of time spent walking and standing. [facility] for wound care. - [employer], maintenance) Signed: 02/10/2020 4:47:33 PM By: Casey Hurst RN, BSN Signed: 02/10/2020 6:08:30 PM By: Casey Ham MD Entered By: [name] on 02/10/2020 09:47:43 -------------------------------------------------------------------------------- Problem List Details Patient Name: Date of Service: [name] 02/10/2020 9:15 A M Medical Record Number: [id number] Patient Account Number: [id number] Date of Birth/Sex: Treating RN: [birth year] (79 y.o. [name] Primary Care Provider: Leeroy [name] Other Clinician: Referring Provider: Treating Provider/Extender: [name] in Treatment: 29 Active Problems ICD-10 Encounter Code Description Active Date MDM Diagnosis E11.621 Type 2 diabetes mellitus with foot ulcer 07/21/2019 No Yes L97.522 Non-pressure chronic ulcer of other part of left foot with fat layer exposed 07/21/2019 No Yes L97.512 Non-pressure chronic ulcer of other part of right foot with fat layer exposed 07/21/2019 No Yes I73.89 Other specified peripheral vascular diseases 07/21/2019 No Yes L03.115 Cellulitis of right lower limb  12/16/2019 No Yes Inactive Problems ICD-10 Code Description Active Date Inactive Date F17.218 Nicotine dependence, cigarettes, with other nicotine-induced disorders 07/21/2019 07/21/2019 I10 Essential (primary) hypertension 07/21/2019 07/21/2019 L84 Corns and callosities 07/21/2019 07/21/2019 Resolved Problems Electronic Signature(s) Signed: 02/10/2020 6:08:30 PM By: Casey Ham MD Entered By: [name] on 02/10/2020 10:52:18 -------------------------------------------------------------------------------- Progress Note Details Patient Name: Date of Service: [name] 02/10/2020 9:15 A M Medical Record Number: [id number] Patient Account Number: [id number] Date of Birth/Sex: Treating RN: Nov 07, 1940 (79 y.o. [name] Primary Care Provider: Leeroy [name] Other Clinician: Referring Provider: Treating Provider/Extender: [name] in Treatment: 29 Subjective History of Present Illness (HPI) very pleasant 79 year old gentleman has type 2 diabetes and has been having an ulcer on his foot for several years. Been seeing as this time around since the end of October. Classified as a Wagner grade 2 because his previous x-rays did not show any problems. He has an MRI pending this Friday. discussing with him he says he has not been able to get his diabetic shoes yet. He continues to smoke his pipe and drinks moonshine and says he will not give this up. I have tried to counsel him regarding this but he firmly says that he is happy to listen to me but he is not going to give up his habits. 08/17/14 -- patient has no fresh complaints but has recently come with his MRI which was done on 08/12/2014. 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 Readmission: 07/21/2019 upon evaluation today patient presents for initial inspection here in our clinic concerning  issues that he has been having with his bilateral feet. He has open wounds that been present at least since the beginning of the year although he does not know an exact time. He is previously  had amputations of all toes and the distal portion of his foot. 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 With that being said he does have a history of diabetes, peripheral vascular disease, nicotine dependence, and hypertension. He has not had any recent arterial or vascular studies he was noncompressible today I think he is going to require referral for arterial studies as well. 2/11; patient was readmitted to our clinic last week. He has bilateral plantar foot wounds in the setting of previous transmetatarsal amputations. He had his arterial studies done on 2/8; these were actually quite good. He was noncompressible on both sides however his waveforms were triphasic. The thought was noncompressible vessels consistent with medial calcification but without significant stenosis in the lower extremities. X-rays were done on the left this showed extensive chronic and postoperative changes without evidence of osteomyelitis. There was no evidence of osteomyelitis on the right foot there was mild vascular calcification MRI was suggested on the left based on clinical correlation. The patient arrives in clinic with some odor and drainage from the left foot. Right foot measures smaller. He is really not offloading these areas although he says he does not walk much 2/25; the patient arrives with some odor and drainage from the left foot again. Post debridement I cultured this area. He has the area on the midfoot roughly the third metatarsal head and the first metatarsal head on the right. He is in surgical shoes bilateral transmet 3/4; culture from last week showed abundant group B strep and a few Proteus. We will put him on cephalexin starting today. Still silver alginate to the wounds 3/11; due  to issues with pharmacy he still does not have the cephalexin that I prescribed a week ago. Apparently this was called into a different pharmacy on Friday but they have not delivered it to him. We have been using silver alginate on the wounds but because of lack of moisture change to silver collagen today. We are offloading as best we can in surgical shoes 3/18; he is taking the cephalexin that I prescribed 2 weeks ago. Using silver collagen. He has surgical shoes. Setting of bilateral previous transmetatarsal amputations. Wounds are mirror-image wounds medially. 3/25; mirror-image wounds in the setting of previous transmetatarsal amputation. The wounds are mirror image medial foot wounds. We have been using silver collagen. We are using surgical shoes offloaded with felt. He came in today asking about his "$900" custom-made shoes although after talking to them it does not appear that he ever really wore these and they are more than a year old 4/8; mirror-image wounds on the medial part of the plantar feet in the setting of previous transmetatarsal amputations. We have been using silver collagen. I aggressively debrided these 2 weeks ago removing callus subcutaneous tissue from around the wound margins. He did indeed bring echo he has custom-made shoes with transmetatarsal inserts. He has never worn these. He claims to be not spending a lot of time on his feet 4/15; mirror-image wounds on the plantar feet in the setting of previous transmetatarsal amputation. We have been using silver alginate. Culture that I did last time grew MRSA and Proteus. I have him on doxycycline. This was a deep tissue culture. The Proteus was not actively plated against doxycycline but was otherwise pansensitive I think the doxycycline would cover this. His wounds actually looks somewhat better. Rolled senescent edges still around the wound edges 4/29; mirror-image wounds on the plantar feet in the setting of previous  bilateral transmetatarsal amputations we  have been using silver alginate. Last time I saw him 2 weeks ago I gave him a course of doxycycline. Things seem to have cleaned up. This is in response to a deep tissue culture that showed MRSA and Proteus 5/6; wounds on the bilateral plantar feet in the setting of previous transmetatarsal amputations. We have been using silver alginate. Arrives in the clinic today with the same raised thick callus and subcutaneous tissue. He has home health changing the dressing. He brought up the fact that he has transportation through [facility] apparently he only has "12 visits". Otherwise he has the pay $40 for a cab ride there and back from his [place] which I guess [place]. 11/11/19-Patient back with left transmet plantar surface much worse with extension of devitalized tissue proximally from the original wound with dense callus rim, the right is about the same, apparently patient was wearing tennis shoes at home per home health, although patient swears that he has been using his offloading devices that he has with inserts. 6/8; I have not seen this patient in over a month however he was seen last week in my absence. He has mirror-image wounds over his bilateral transmit plantar surfaces. He has been using silver alginate. He comes in today with literally a mountain of callus around the 2 wounds on the left. The area on the right somewhat better but still callus and nonviable tissue around the circular wound with some depth. He had an x-ray done last week of the left foot that was negative a culture showed multiple organisms. He did not get any antibiotics. He has custom-made shoes and apparently is getting another pair from [employer] that is in the process. I told him that we are not offloading the sufficiently for what ever reason. I do not think he is wearing his custom shoes at home although he says he is not walking that much 6/17 bilateral plantar  foot in the setting of previous transmetatarsal amputations. He has been using silver alginate. He has his own custom made shoes which he is supposed to have it replaced in the next week or 2. We have been extensively debriding the callus around both wound sites and things look slightly better today 6/24; patient arrives in clinic today with her intake nurse pointing out odor and drainage. We have been using silver alginate. I was going to put a total contact cast on this leg today but in view of the odor and drainage will postpone that 7/1; intake nurse still points out odor and drainage. Last week I did a deep tissue culture of the right foot this grew a few methicillin resistant staph aureus, few group B strep and a few Pseudomonas. No anaerobes were isolated. He will require a course of 2 different antibiotics probably ciprofloxacin and doxycycline. As usual he has thick callus around the wound 7/15; copious amounts of callus. X-rays I did last time showed chronic changes versus possible osteomyelitis at the plantar aspect of the proximal metatarsal. We will therefore go ahead with the MRI. 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 We have been using silver alginate. 7/22; patient's MRI is not until 01/19/2020. Still copious amounts of thick callus subcutaneous tissue from around the wound margins. We have been using silver alginate but apparently home health is not actually putting these in the wounds especially the right with the undermining area laterally 8/26; patient has not been  here in over a month. He did not get his MRI done and he is not rebooked it. I am not exactly sure what he has been doing to these wounds on his bilateral feet. He comes in today with odor, thick callus around both wound areas. He is not really offloading this to any major degree. We have been using silver  alginate Objective Constitutional Sitting or standing Blood Pressure is within target range for patient.. Pulse regular and within target range for patient.Marland Kitchen Respirations regular, non-labored and within target range.. Vitals Time Taken: 9:00 AM, Height: 73 in, Weight: 180 lbs, BMI: 23.7, Temperature: 98.2 F, Pulse: 70 bpm, Respiratory Rate: 18 breaths/min, Blood Pressure: 145/79 mmHg, Capillary Blood Glucose: 90 mg/dl. General Notes: patient stated last CBG was 90 Cardiovascular Pedal pulses are palpable. General Notes: Wound exam ooThe wounds are about the same as they were last time but considerable amounts of callus thick skin and subcutaneous tissue around the wound margins. I used pickups and a #10 scalpel to remove copious amounts of tissue from around the surface of these wounds ooI also noticed a large callus in his left midfoot that looked as though it could be preulcerative. I therefore attempted debridement here. During the process of doing this I inadvertently created a wound in the plantar aspect of his heel that we had the Steri-Stripped. Integumentary (Hair, Skin) Wound #21 status is Open. Original cause of wound was Gradually Appeared. The wound is located on the [place]. The wound measures 0.3cm length x 0.4cm width x 0.6cm depth; 0.094cm^2 area and 0.057cm^3 volume. There is Fat Layer (Subcutaneous Tissue) exposed. There is no tunneling noted, however, there is undermining starting at 12:00 and ending at 12:00 with a maximum distance of 0.5cm. There is a medium amount of serosanguineous drainage noted. Foul odor after cleansing was noted. The wound margin is thickened. There is large (67-100%) pink granulation within the wound bed. There is no necrotic tissue within the wound bed. Wound #22 status is Open. Original cause of wound was Gradually Appeared. The wound is located on the Right Metatarsal head first. The wound measures 0.9cm length x 1.1cm width x 0.5cm  depth; 0.778cm^2 area and 0.389cm^3 volume. There is Fat Layer (Subcutaneous Tissue) exposed. There is no tunneling noted, however, there is undermining starting at 4:00 and ending at 7:00 with a maximum distance of 0.4cm. There is a medium amount of serosanguineous drainage noted. Foul odor after cleansing was noted. The wound margin is thickened. There is large (67-100%) red granulation within the wound bed. There is no necrotic tissue within the wound bed. 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 There was a Selective/Open Wound Skin/Epidermis Debridement with a total area of 0.12 sq cm performed by [name]., MD. With the following instrument(s): Blade to remove Non-Viable tissue/material. Material removed includes Callus and Skin: Epidermis and. No specimens were taken. A time out was conducted at 09:40, prior to the start of the procedure. A Moderate amount of bleeding was controlled with Silver Nitrate. The procedure was tolerated well with a pain level of 0  throughout and a pain level of 0 following the procedure. Post Debridement Measurements: 0.3cm length x 0.4cm width x 0.6cm depth; 0.057cm^3 volume. Character of Wound/Ulcer Post Debridement is improved. Severity of Tissue Post Debridement is: Fat layer exposed. Post procedure Diagnosis Wound #21: Same as Pre-Procedure Wound #22 Pre-procedure diagnosis of Wound #22 is a Diabetic Wound/Ulcer of the Lower Extremity located on the Right Metatarsal head first .Severity of  Tissue Pre Debridement is: Fat layer exposed. There was a Selective/Open Wound Skin/Epidermis Debridement with a total area of 0.99 sq cm performed by [name]., MD. With the following instrument(s): Blade to remove Non-Viable tissue/material. Material removed includes Callus and Skin: Epidermis and. No specimens were taken. A time out was conducted at 09:40, prior to the start of the procedure. A Minimum amount of bleeding was controlled with Pressure. The procedure was tolerated well with a pain level of 0 throughout and a pain level of 0 following the procedure. Post Debridement Measurements: 0.9cm length x 1.1cm width x 0.5cm depth; 0.389cm^3 volume. Character of Wound/Ulcer Post Debridement is improved. Severity of Tissue Post Debridement is: Fat layer exposed. Post procedure Diagnosis Wound #22: Same as Pre-Procedure Plan Follow-up Appointments: Return Appointment in 1 week. Dressing Change Frequency: Wound #21 Left,Plantar Foot: Change dressing three times week. Wound #22 Right Metatarsal head first: Change dressing three times week. Wound Cleansing: Clean wound with Wound Cleanser - or normal saline Primary Wound Dressing: Wound #21 Left,Plantar Foot: Calcium Alginate with Silver Other: - steri strips to cut on plantar foot Wound #22 Right Metatarsal head first: Calcium Alginate with Silver - ensure to pack into wound bed and any undermining Secondary Dressing: Wound #21 Left,Plantar Foot: Foam - foam donut to offload Kerlix/Rolled Gauze Dry Gauze Wound #22 Right Metatarsal head first: Foam - foam donut to offload Kerlix/Rolled Gauze Dry Gauze Edema Control: Avoid standing for long periods of time Elevate legs to the level of the heart or above for 30 minutes daily and/or when sitting, a frequency of: - throughout the day. Off-Loading: Other: - felt patient's personal shoes. patient to wear diabetic shoes. patient to limit about of time spent walking and  standing. Home Health: [facility] for wound care. - [facility] continue with silver alginate bilaterally. 2. I Steri-Stripped the area created on his heel 3. I would like to MRI of the right foot he is going to try to rearrange this. 4. I do not think we are offloading this adequately. If there is no infection in the right foot I would like to put it in a total contact cast. That was the plan last time Electronic Signature(s) Signed: 02/10/2020 6:08:30 PM By: Casey Ham MD Entered By: [name] on 02/10/2020 10:56:22 -------------------------------------------------------------------------------- SuperBill Details Patient Name: Date of Service: [name] 02/10/2020 Medical Record Number: [id number] Patient Account Number: [id number] Date of Birth/Sex: Treating RN: February 19, 1941 (79 y.o. [name] Primary Care Provider: Leeroy [name] Other Clinician: Referring Provider: Treating Provider/Extender: [name] in Treatment: 29 Diagnosis Coding ICD-10 Codes Code Description E11.621 Type 2 diabetes mellitus with foot ulcer L97.522 Non-pressure chronic ulcer of other part of left foot with fat layer exposed L97.512 Non-pressure chronic ulcer of other part of right foot with fat layer exposed I73.89 Other specified peripheral vascular diseases L03.115 Cellulitis of right lower limb Facility Procedures The patient participates with Medicare or their insurance follows the Medicare Facility Guidelines: CPT4 Code Description Modifier Quantity [id number] -  DEB SUBQ TISSUE 20 SQ CM/< 1 ICD-10 Diagnosis Description L97.522 Non-pressure chronic ulcer of  other part of left foot with fat layer exposed L97.512 Non-pressure chronic ulcer of other part of right foot with fat layer exposed Physician Procedures : CPT4 Code Description Modifier [id number] - WC PHYS SUBQ TISS 20 SQ CM ICD-10 Diagnosis  Description L97.522 Non-pressure chronic ulcer of other part of left foot with fat layer exposed L97.512 Non-pressure chronic ulcer of other part of right foot  with fat layer exposed Quantity: 1 Electronic Signature(s) Signed: 02/10/2020 6:08:30 PM By: Casey Ham MD Entered By: [name] on 02/10/2020 10:57:33

## 2020-02-12 NOTE — Progress Notes (Signed)
[name], [name] ([id number]) Visit Report for 02/10/2020 Arrival Information Details Patient Name: Date of Service: [name], [name] 02/10/2020 9:15 A M Medical Record Number: [id number] Patient Account Number: [id number] Date of Birth/Sex: Treating RN: [birth year] (79 y.o. [name] Primary Care [name]: [name] Other Clinician: Referring [name]: Treating [name]/Extender: [name] in Treatment: 29 Visit Information History Since Last Visit Added or deleted any medications: No Patient Arrived: Cane Any new allergies or adverse reactions: No Arrival Time: 09:02 Had a fall or experienced change in No Accompanied By: self activities of daily living that may affect Transfer Assistance: None risk of falls: Patient Identification Verified: Yes Signs or symptoms of abuse/neglect since last visito No Secondary Verification Process Completed: Yes Hospitalized since last visit: No Patient Requires Transmission-Based Precautions: No Implantable device outside of the clinic excluding No Patient Has Alerts: Yes cellular tissue based products placed in the center Patient Alerts: R ABI non compressible since last visit: L ABI non compressible Has Dressing in Place as Prescribed: Yes Pain Present Now: No Electronic Signature(s) Signed: 02/11/2020 5:56:33 PM By: Kela Millin Entered By: Kela Millin on 02/10/2020 09:02:39 -------------------------------------------------------------------------------- Encounter Discharge Information Details Patient Name: Date of Service: [name], [name] 02/10/2020 9:15 A M Medical Record Number: [id number] Patient Account Number: [id number] Date of Birth/Sex: Treating RN: 04/03/1941 (79 y.o. [name]) [name] Primary Care Milani Lowenstein: [name] Other Clinician: Referring [name]: Treating [name]/Extender: [name] Weeks in Treatment:  29 Encounter Discharge Information Items Post Procedure Vitals Discharge Condition: Stable Temperature (F): 98.2 Ambulatory Status: Cane Pulse (bpm): 70 Discharge Destination: Home Respiratory Rate (breaths/min): 18 Transportation: Private Auto Blood Pressure (mmHg): 145/79 Accompanied By: self Schedule Follow-up Appointment: Yes Clinical Summary of Care: Patient Declined Electronic Signature(s) Signed: 02/11/2020 5:49:17 PM By: Casey Coria RN Entered By: [name] on 02/10/2020 10:13:11 -------------------------------------------------------------------------------- Lower Extremity Assessment Details Patient Name: Date of Service: [name] 02/10/2020 9:15 A M Medical Record Number: [id number] Patient Account Number: [id number] Date of Birth/Sex: Treating RN: Nov 03, 1940 (79 y.o. [name] Primary Care [name]: [name] Other Clinician: Referring [name]: Treating Jiles Goya/Extender: [name] Weeks in Treatment: 29 Edema Assessment Assessed: [name]: No] [[name]: No] Edema: [Left: Yes] [[name]: Yes] Calf Left: [name]: Point of Measurement: 32 cm From Medial Instep 30.5 cm 33 cm Ankle Left: [name]: Point of Measurement: 10 cm From Medial Instep 22.5 cm 23.5 cm Vascular Assessment Pulses: Dorsalis Pedis Palpable: [Left:No] [[name]:No] Electronic Signature(s) Signed: 02/11/2020 5:56:33 PM By: Kela Millin Entered By: Kela Millin on 02/10/2020 09:05:09 -------------------------------------------------------------------------------- Multi Wound Chart Details Patient Name: Date of Service: [name], [name] 02/10/2020 9:15 A M Medical Record Number: [id number] Patient Account Number: [id number] Date of Birth/Sex: Treating RN: 19-Aug-1940 (79 y.o. [name] Primary Care [name]: [name] Other Clinician: Referring Melvena Vink: Treating [name]/Extender: [name] in Treatment: 29 Vital Signs Height(in): 73 Capillary Blood Glucose(mg/dl): 90 Weight(lbs): 180 Pulse(bpm): 58 Body Mass Index(BMI): 24 Blood Pressure(mmHg): 145/79 Temperature(F): 98.2 Respiratory Rate(breaths/min): 18 Photos: [21:No Photos Left, Plantar Foot] [22:No Photos [name] Metatarsal head first] [N/A:N/A N/A] Wound Location: [21:Gradually Appeared] [22:Gradually Appeared] [N/A:N/A] Wounding Event: [21:Diabetic Wound/Ulcer of the Lower] [22:Diabetic Wound/Ulcer of the Lower] [N/A:N/A] Primary Etiology: [21:Extremity Cataracts, Glaucoma, Hypertension,] [22:Extremity Cataracts, Glaucoma, Hypertension,] [N/A:N/A] Comorbid History: [21:Peripheral Arterial Disease, Type II Diabetes, Osteomyelitis, Neuropathy 06/17/2018] [22:Peripheral Arterial Disease, Type II Diabetes, Osteomyelitis, Neuropathy 06/17/2018] [N/A:N/A] Date Acquired: [21:29] [22:29] [N/A:N/A]  Weeks of Treatment: [21:Open] [22:Open] [N/A:N/A] Wound Status: [21:0.3x0.4x0.6] [22:0.9x1.1x0.5] [N/A:N/A] Measurements L x W x D (cm) [21:0.094] [22:0.778] [N/A:N/A] A (cm) : rea [21:0.057] [22:0.389] [N/A:N/A] Volume (cm) : [21:95.90%] [22:-23.90%] [N/A:N/A] % Reduction in A rea: [21:95.10%] [22:-106.90%] [N/A:N/A] % Reduction in Volume: [21:12] [22:4] Starting Position 1 (o'clock): [21:12] [22:7] Ending Position 1 (o'clock): [21:0.5] [22:0.4] Maximum Distance 1 (cm): [21:Yes] [22:Yes] [N/A:N/A] Undermining: [21:Grade 2] [22:Grade 2] [N/A:N/A] Classification: [21:Medium] [22:Medium] [N/A:N/A] Exudate A mount: [21:Serosanguineous] [22:Serosanguineous] [N/A:N/A] Exudate Type: [21:red, brown] [22:red, brown] [N/A:N/A] Exudate Color: [21:Yes] [22:Yes] [N/A:N/A] Foul Odor A Cleansing: [21:fter No] [22:No] [N/A:N/A] Odor A nticipated Due to Product Use: [21:Thickened] [22:Thickened] [N/A:N/A] Wound Margin: [21:Large (67-100%)] [22:Large (67-100%)] [N/A:N/A] Granulation A mount: [21:Pink] [22:Red]  [N/A:N/A] Granulation Quality: [21:None Present (0%)] [22:None Present (0%)] [N/A:N/A] Necrotic A mount: [21:Fat Layer (Subcutaneous Tissue): Yes Fat Layer (Subcutaneous Tissue): Yes N/A] Exposed Structures: [21:Fascia: No Tendon: No Muscle: No Joint: No Bone: No None] [22:Fascia: No Tendon: No Muscle: No Joint: No Bone: No Small (1-33%)] [N/A:N/A] Epithelialization: [21:Debridement - Selective/Open Wound Debridement - Selective/Open Wound N/A] Debridement: Pre-procedure Verification/Time Out 09:40 [22:09:40] [N/A:N/A] Taken: [21:Callus] [22:Callus] [N/A:N/A] Tissue Debrided: [21:Skin/Epidermis] [22:Skin/Epidermis] [N/A:N/A] Level: [21:0.12] [22:0.99] [N/A:N/A] Debridement A (sq cm): [21:rea Blade] [22:Blade] [N/A:N/A] Instrument: [21:Moderate] [22:Minimum] [N/A:N/A] Bleeding: [21:Silver Nitrate] [22:Pressure] [N/A:N/A] Hemostasis A chieved: [21:0] [22:0] [N/A:N/A] Procedural Pain: [21:0] [22:0] [N/A:N/A] Post Procedural Pain: [21:Procedure was tolerated well] [22:Procedure was tolerated well] [N/A:N/A] Debridement Treatment Response: [21:0.3x0.4x0.6] [22:0.9x1.1x0.5] [N/A:N/A] Post Debridement Measurements L x W x D (cm) [21:0.057] [22:0.389] [N/A:N/A] Post Debridement Volume: (cm) [21:Debridement] [22:Debridement] [N/A:N/A] Treatment Notes Wound #21 (Left, Plantar Foot) 1. Cleanse With Wound Cleanser 3. Primary Dressing Applied Calcium Alginate Ag Other primary dressing (specifiy in notes) 4. Secondary Dressing Dry Gauze Roll Gauze 5. Secured With Tape Notes steri strips Wound #22 ([name] Metatarsal head first) 1. Cleanse With Wound Cleanser 3. Primary Dressing Applied Calcium Alginate Ag Other primary dressing (specifiy in notes) 4. Secondary Dressing Dry Gauze Roll Gauze 5. Secured With Tape Notes Dispensing optician) Signed: 02/10/2020 4:47:33 PM By: Zandra Abts RN, BSN Signed: 02/10/2020 6:08:30 PM By: Baltazar Najjar MD Entered By: Baltazar Najjar on 02/10/2020 10:52:29 -------------------------------------------------------------------------------- Multi-Disciplinary Care Plan Details Patient Name: Date of Service: Casey Val Eagle [name], [name] 02/10/2020 9:15 A M Medical Record Number: [id number] Patient Account Number: [id number] Date of Birth/Sex: Treating RN: 11-Mar-1941 (79 y.o. [name] Primary Care Nalleli Largent: Lorenda Ishihara Other Clinician: Referring Mairen Wallenstein: Treating Santita Hunsberger/Extender: Maryann Alar in Treatment: 29 Active Inactive Wound/Skin Impairment Nursing Diagnoses: Impaired tissue integrity Knowledge deficit related to smoking impact on wound healing Knowledge deficit related to ulceration/compromised skin integrity Goals: Patient will demonstrate a reduced rate of smoking or cessation of smoking Date Initiated: 07/21/2019 Date Inactivated: 11/23/2019 Target Resolution Date: 11/12/2019 Goal Status: Met Patient/caregiver will verbalize understanding of skin care regimen Date Initiated: 07/21/2019 Target Resolution Date: 03/10/2020 Goal Status: Active Ulcer/skin breakdown will have a volume reduction of 30% by week 4 Date Initiated: 07/21/2019 Date Inactivated: 09/23/2019 Target Resolution Date: 09/17/2019 Unmet Reason: see wound Goal Status: Unmet measurements. Interventions: Assess patient/caregiver ability to obtain necessary supplies Assess patient/caregiver ability to perform ulcer/skin care regimen upon admission and as needed Assess ulceration(s) every visit Provide education on smoking Provide education on ulcer and skin care Treatment Activities: Skin care regimen initiated : 07/21/2019 Topical wound management initiated : 07/21/2019 Notes: Electronic Signature(s) Signed: 02/10/2020 4:47:33 PM By: Zandra Abts RN, BSN Entered By: Zandra Abts on 02/10/2020  08:16:32 -------------------------------------------------------------------------------- Pain Assessment  Details Patient Name: Date of Service: [name] 02/10/2020 9:15 A M Medical Record Number: [id number] Patient Account Number: [id number] Date of Birth/Sex: Treating RN: 05-16-1941 (79 y.o. [name] Primary Care Chanin Frumkin: Lorenda Ishihara Other Clinician: Referring Terease Marcotte: Treating Rena Sweeden/Extender: Maryann Alar in Treatment: 29 Active Problems Location of Pain Severity and Description of Pain Patient Has Paino No Site Locations Pain Management and Medication Current Pain Management: Electronic Signature(s) Signed: 02/11/2020 5:56:33 PM By: Cherylin Mylar Entered By: Cherylin Mylar on 02/10/2020 09:04:38 -------------------------------------------------------------------------------- Patient/Caregiver Education Details Patient Name: Date of Service: Casey Val Eagle [name], [name] 8/26/2021andnbsp9:15 A M Medical Record Number: [id number] Patient Account Number: [id number] Date of Birth/Gender: Treating RN: 11-Jan-1941 (79 y.o. [name] Primary Care Physician: Lorenda Ishihara Other Clinician: Referring Physician: Treating Physician/Extender: Maryann Alar in Treatment: 29 Education Assessment Education Provided To: Patient Education Topics Provided Wound/Skin Impairment: Methods: Explain/Verbal Responses: State content correctly [employer]) Signed: 02/10/2020 4:47:33 PM By: Zandra Abts RN, BSN Entered By: Zandra Abts on 02/10/2020 10:12:18 -------------------------------------------------------------------------------- Wound Assessment Details Patient Name: Date of Service: Casey Val Eagle [name], [name] 02/10/2020 9:15 A M Medical Record Number: [id number] Patient Account Number: [id number] Date of Birth/Sex: Treating RN: 1941-05-27 (79 y.o. [name] Primary Care Lawyer Washabaugh: Lorenda Ishihara Other Clinician: Referring Jomar Denz: Treating  Amaal Dimartino/Extender: Maryann Alar in Treatment: 29 Wound Status Wound Number: 21 Primary Diabetic Wound/Ulcer of the Lower Extremity Etiology: Wound Location: Left, Plantar Foot Wound Open Wounding Event: Gradually Appeared Status: Date Acquired: 06/17/2018 Comorbid Cataracts, Glaucoma, Hypertension, Peripheral Arterial Disease, Weeks Of Treatment: 29 History: Type II Diabetes, Osteomyelitis, Neuropathy Clustered Wound: No Photos Photo Uploaded By: Benjaman Kindler on 02/11/2020 14:43:15 Wound Measurements Length: (cm) 0.3 Width: (cm) 0.4 Depth: (cm) 0.6 Area: (cm) 0.094 Volume: (cm) 0.057 % Reduction in Area: 95.9% % Reduction in Volume: 95.1% Epithelialization: None Tunneling: No Undermining: Yes Starting Position (o'clock): 12 Ending Position (o'clock): 12 Maximum Distance: (cm) 0.5 Wound Description Classification: Grade 2 Wound Margin: Thickened Exudate Amount: Medium Exudate Type: Serosanguineous Exudate Color: red, brown Foul Odor After Cleansing: Yes Due to Product Use: No Slough/Fibrino No Wound Bed Granulation Amount: Large (67-100%) Exposed Structure Granulation Quality: Pink Fascia Exposed: No Necrotic Amount: None Present (0%) Fat Layer (Subcutaneous Tissue) Exposed: Yes Tendon Exposed: No Muscle Exposed: No Joint Exposed: No Bone Exposed: No Treatment Notes Wound #21 (Left, Plantar Foot) 1. Cleanse With Wound Cleanser 3. Primary Dressing Applied Calcium Alginate Ag Other primary dressing (specifiy in notes) 4. Secondary Dressing Dry Gauze Roll Gauze 5. Secured With Tape Notes Dispensing optician) Signed: 02/11/2020 5:56:33 PM By: Cherylin Mylar Entered By: Cherylin Mylar on 02/10/2020 09:12:13 -------------------------------------------------------------------------------- Wound Assessment Details Patient Name: Date of Service: [name], [name] 02/10/2020 9:15 A M Medical Record  Number: [id number] Patient Account Number: [id number] Date of Birth/Sex: Treating RN: 1940-10-24 (79 y.o. [name] Primary Care Pahoua Schreiner: Lorenda Ishihara Other Clinician: Referring Joevon Holliman: Treating Trueman Worlds/Extender: Maryann Alar in Treatment: 29 Wound Status Wound Number: 22 Primary Diabetic Wound/Ulcer of the Lower Extremity Etiology: Wound Location: [name] Metatarsal head first Wound Open Wounding Event: Gradually Appeared Status: Date Acquired: 06/17/2018 Comorbid Cataracts, Glaucoma, Hypertension, Peripheral Arterial Disease, Weeks Of Treatment: 29 History: Type II Diabetes, Osteomyelitis, Neuropathy Clustered Wound: No Photos Photo Uploaded By: Benjaman Kindler on 02/11/2020 14:43:16 Wound Measurements Length: (cm) 0.9 Width: (cm) 1.1 Depth: (cm) 0.5 Area: (cm)  0.778 Volume: (cm) 0.389 % Reduction in Area: -23.9% % Reduction in Volume: -106.9% Epithelialization: Small (1-33%) Tunneling: No Undermining: Yes Starting Position (o'clock): 4 Ending Position (o'clock): 7 Maximum Distance: (cm) 0.4 Wound Description Classification: Grade 2 Wound Margin: Thickened Exudate Amount: Medium Exudate Type: Serosanguineous Exudate Color: red, brown Foul Odor After Cleansing: Yes Due to Product Use: No Slough/Fibrino No Wound Bed Granulation Amount: Large (67-100%) Exposed Structure Granulation Quality: Red Fascia Exposed: No Necrotic Amount: None Present (0%) Fat Layer (Subcutaneous Tissue) Exposed: Yes Tendon Exposed: No Muscle Exposed: No Joint Exposed: No Bone Exposed: No Treatment Notes Wound #22 ([name] Metatarsal head first) 1. Cleanse With Wound Cleanser 3. Primary Dressing Applied Calcium Alginate Ag Other primary dressing (specifiy in notes) 4. Secondary Dressing Dry Gauze Roll Gauze 5. Secured With Tape Notes Facilities manager) Signed: 02/11/2020 5:56:33 PM By: Kela Millin Entered By: Kela Millin on 02/10/2020 09:11:30 -------------------------------------------------------------------------------- Vitals Details Patient Name: Date of Service: [name], [name] 02/10/2020 9:15 A M Medical Record Number: [id number] Patient Account Number: [id number] Date of Birth/Sex: Treating RN: 06-21-1940 (79 y.o. [name] Primary Care [place]: [name] Other Clinician: Referring Tishie Altmann: Treating Analisia Kingsford/Extender: [name] in Treatment: 29 Vital Signs Time Taken: 09:00 Temperature (F): 98.2 Height (in): 73 Pulse (bpm): 70 Weight (lbs): 180 Respiratory Rate (breaths/min): 18 Body Mass Index (BMI): 23.7 Blood Pressure (mmHg): 145/79 Capillary Blood Glucose (mg/dl): 90 Reference Range: 80 - 120 mg / dl Notes patient stated last CBG was 90 Electronic Signature(s) Signed: 02/11/2020 5:56:33 PM By: Kela Millin Entered By: Kela Millin on 02/10/2020 09:03:18

## 2020-02-17 ENCOUNTER — Encounter (HOSPITAL_BASED_OUTPATIENT_CLINIC_OR_DEPARTMENT_OTHER): Payer: Medicare Other | Attending: Internal Medicine | Admitting: Internal Medicine

## 2020-02-29 ENCOUNTER — Encounter (HOSPITAL_BASED_OUTPATIENT_CLINIC_OR_DEPARTMENT_OTHER): Payer: Medicare Other | Admitting: Internal Medicine

## 2020-03-03 ENCOUNTER — Encounter (HOSPITAL_BASED_OUTPATIENT_CLINIC_OR_DEPARTMENT_OTHER): Payer: Medicare Other | Attending: Internal Medicine | Admitting: Internal Medicine

## 2020-03-03 DIAGNOSIS — E1169 Type 2 diabetes mellitus with other specified complication: Secondary | ICD-10-CM | POA: Diagnosis not present

## 2020-03-03 DIAGNOSIS — M869 Osteomyelitis, unspecified: Secondary | ICD-10-CM | POA: Insufficient documentation

## 2020-03-03 DIAGNOSIS — E11621 Type 2 diabetes mellitus with foot ulcer: Secondary | ICD-10-CM | POA: Diagnosis present

## 2020-03-03 DIAGNOSIS — I10 Essential (primary) hypertension: Secondary | ICD-10-CM | POA: Diagnosis not present

## 2020-03-03 DIAGNOSIS — L97422 Non-pressure chronic ulcer of left heel and midfoot with fat layer exposed: Secondary | ICD-10-CM | POA: Insufficient documentation

## 2020-03-03 DIAGNOSIS — E1151 Type 2 diabetes mellitus with diabetic peripheral angiopathy without gangrene: Secondary | ICD-10-CM | POA: Insufficient documentation

## 2020-03-03 DIAGNOSIS — Z9119 Patient's noncompliance with other medical treatment and regimen: Secondary | ICD-10-CM | POA: Insufficient documentation

## 2020-03-03 DIAGNOSIS — E1139 Type 2 diabetes mellitus with other diabetic ophthalmic complication: Secondary | ICD-10-CM | POA: Insufficient documentation

## 2020-03-03 DIAGNOSIS — E1140 Type 2 diabetes mellitus with diabetic neuropathy, unspecified: Secondary | ICD-10-CM | POA: Diagnosis not present

## 2020-03-03 DIAGNOSIS — H42 Glaucoma in diseases classified elsewhere: Secondary | ICD-10-CM | POA: Insufficient documentation

## 2020-03-03 DIAGNOSIS — L97512 Non-pressure chronic ulcer of other part of right foot with fat layer exposed: Secondary | ICD-10-CM | POA: Diagnosis not present

## 2020-03-03 DIAGNOSIS — L97522 Non-pressure chronic ulcer of other part of left foot with fat layer exposed: Secondary | ICD-10-CM | POA: Insufficient documentation

## 2020-03-06 NOTE — Progress Notes (Signed)
Casey, [name] ([id number]) Visit Report for 03/03/2020 Debridement Details Patient Name: Date of Service: [name], [place] 03/03/2020 8:30 A M Medical Record Number: [id number] Patient Account Number: [id number] Date of Birth/Sex: Treating RN: [birth year] (79 y.o. [name] Primary Care Provider: [name] Other Clinician: Referring Provider: Treating Provider/Extender: Gabriel Rainwater in Treatment: 32 Debridement Performed for Assessment: Wound #21 [place] Performed By: Physician Ricard Dillon., MD Debridement Type: Debridement Severity of Tissue Pre Debridement: Fat layer exposed Level of Consciousness (Pre-procedure): Awake and Alert Pre-procedure Verification/Time Out Yes - 08:34 Taken: Start Time: 08:34 Pain Control: Other : benzocaine, 20% T Area Debrided (L x W): otal 4 (cm) x 4 (cm) = 16 (cm) Tissue and other material debrided: Non-Viable, Callus Level: Non-Viable Tissue Debridement Description: Selective/Open Wound Instrument: Blade Bleeding: Minimum Hemostasis Achieved: Pressure End Time: 08:34 Procedural Pain: 0 Post Procedural Pain: 0 Response to Treatment: Procedure was tolerated well Level of Consciousness (Post- Awake and Alert procedure): Post Debridement Measurements of Total Wound Length: (cm) 1 Width: (cm) 0.3 Depth: (cm) 0.4 Volume: (cm) 0.094 Character of Wound/Ulcer Post Debridement: Improved Severity of Tissue Post Debridement: Fat layer exposed Post Procedure Diagnosis Same as Pre-procedure Electronic Signature(s) Signed: 03/06/2020 8:08:43 AM By: Linton Ham MD Signed: 03/06/2020 1:33:30 PM By: Kela Millin Entered By: Kela Millin on 03/03/2020 08:36:28 -------------------------------------------------------------------------------- Debridement Details Patient Name: Date of Service: [name], [name] 03/03/2020 8:30 A M Medical Record Number: [id number] Patient  Account Number: [id number] Date of Birth/Sex: Treating RN: 03/01/1941 (79 y.o. [name] Primary Care Provider: [name] Other Clinician: Referring Provider: Treating Provider/Extender: Gabriel Rainwater in Treatment: 32 Debridement Performed for Assessment: Wound #22 Right Metatarsal head first Performed By: Physician Ricard Dillon., MD Debridement Type: Debridement Severity of Tissue Pre Debridement: Fat layer exposed Level of Consciousness (Pre-procedure): Awake and Alert Pre-procedure Verification/Time Out Yes - 08:35 Taken: Start Time: 08:35 Pain Control: Other : benzocaine, 20% T Area Debrided (L x W): otal 2 (cm) x 2 (cm) = 4 (cm) Tissue and other material debrided: Non-Viable, Callus Level: Non-Viable Tissue Debridement Description: Selective/Open Wound Instrument: Blade Bleeding: Minimum Hemostasis Achieved: Pressure End Time: 08:35 Procedural Pain: 0 Post Procedural Pain: 0 Response to Treatment: Procedure was tolerated well Level of Consciousness (Post- Awake and Alert procedure): Post Debridement Measurements of Total Wound Length: (cm) 0.6 Width: (cm) 0.6 Depth: (cm) 0.3 Volume: (cm) 0.085 Character of Wound/Ulcer Post Debridement: Improved Severity of Tissue Post Debridement: Fat layer exposed Post Procedure Diagnosis Same as Pre-procedure Electronic Signature(s) Signed: 03/06/2020 8:08:43 AM By: Linton Ham MD Signed: 03/06/2020 1:33:30 PM By: Kela Millin Entered By: Kela Millin on 03/03/2020 08:37:33 -------------------------------------------------------------------------------- Debridement Details Patient Name: Date of Service: [name], [name] 03/03/2020 8:30 A M Medical Record Number: [id number] Patient Account Number: [id number] Date of Birth/Sex: Treating RN: 1941-03-17 (79 y.o. [name] Primary Care Provider: [name] Other Clinician: Referring  Provider: Treating Provider/Extender: Gabriel Rainwater in Treatment: 32 Debridement Performed for Assessment: Wound #24 Left Calcaneus Performed By: Physician Ricard Dillon., MD Debridement Type: Debridement Severity of Tissue Pre Debridement: Fat layer exposed Level of Consciousness (Pre-procedure): Awake and Alert Pre-procedure Verification/Time Out Yes - 08:34 Taken: Start Time: 08:34 Pain Control: Other : benzocaine, 20% T Area Debrided (L x W): otal 2.9 (cm) x 0.4 (cm) = 1.16 (cm) Tissue and other material debrided: Viable, Non-Viable, Callus, Skin: Epidermis Level: Skin/Epidermis Debridement Description:  Selective/Open Wound Instrument: Blade Bleeding: Minimum Hemostasis Achieved: Pressure End Time: 08:34 Procedural Pain: 0 Post Procedural Pain: 0 Response to Treatment: Procedure was tolerated well Level of Consciousness (Post- Awake and Alert procedure): Post Debridement Measurements of Total Wound Length: (cm) 2.9 Width: (cm) 0.4 Depth: (cm) 0.3 Volume: (cm) 0.273 Character of Wound/Ulcer Post Debridement: Improved Severity of Tissue Post Debridement: Fat layer exposed Post Procedure Diagnosis Same as Pre-procedure Electronic Signature(s) Signed: 03/06/2020 8:08:43 AM By: Linton Ham MD Signed: 03/06/2020 1:33:30 PM By: Kela Millin Entered By: Kela Millin on 03/03/2020 08:40:14 -------------------------------------------------------------------------------- HPI Details Patient Name: Date of Service: [name], [name] 03/03/2020 8:30 A M Medical Record Number: [id number] Patient Account Number: [id number] Date of Birth/Sex: Treating RN: 11/05/1940 (79 y.o. [name] Primary Care Provider: [name] Other Clinician: Referring Provider: Treating Provider/Extender: Gabriel Rainwater in Treatment: 13 History of Present Illness HPI Description: very pleasant  79 year old gentleman has type 2 diabetes and has been having an ulcer on his foot for several years. Been seeing as this time around since the end of October. Classified as a Wagner grade 2 because his previous x-rays did not show any problems. He has an MRI pending this Friday. discussing with him he says he has not been able to get his diabetic shoes yet. He continues to smoke his pipe and drinks moonshine and says he will not give this up. I have tried to counsel him regarding this but he firmly says that he is happy to listen to me but he is not going to give up his habits. 08/17/14 -- patient has no fresh complaints but has recently come with his MRI which was done on 08/12/2014. 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 Readmission: 07/21/2019 upon evaluation today patient presents for initial inspection here in our clinic concerning issues that he has been having with his bilateral feet. He has open wounds that been present at least since the beginning of the year although he does not know an exact time. He is previously had amputations of all toes and the distal portion of his foot. 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 With that being said he does have a history of diabetes, peripheral vascular disease, nicotine dependence, and hypertension. He has not had any recent arterial or vascular studies he was noncompressible today I think he is going to require referral for arterial studies as well. 2/11; patient was readmitted to our clinic last week. He has bilateral plantar foot wounds in the setting of previous transmetatarsal amputations. He had his arterial studies done on 2/8; these were actually quite good. He was noncompressible on both sides however his waveforms were triphasic. The thought was noncompressible vessels consistent with medial calcification but  without significant stenosis in the lower extremities. X-rays were done on the left this showed extensive chronic and postoperative changes without evidence of osteomyelitis. There was no evidence of osteomyelitis on the right foot there was mild vascular calcification MRI was suggested on the left based on clinical correlation. The patient arrives in clinic with some odor and drainage from the left foot. Right foot measures smaller. He is really not offloading these areas although he says he does not walk much 2/25; the patient arrives with some odor and drainage from the left foot again. Post debridement I cultured this area. He has the area on the midfoot roughly  the third metatarsal head and the first metatarsal head on the right. He is in surgical shoes bilateral transmet 3/4; culture from last week showed abundant group B strep and a few Proteus. We will put him on cephalexin starting today. Still silver alginate to the wounds 3/11; due to issues with pharmacy he still does not have the cephalexin that I prescribed a week ago. Apparently this was called into a different pharmacy on Friday but they have not delivered it to him. We have been using silver alginate on the wounds but because of lack of moisture change to silver collagen today. We are offloading as best we can in surgical shoes 3/18; he is taking the cephalexin that I prescribed 2 weeks ago. Using silver collagen. He has surgical shoes. Setting of bilateral previous transmetatarsal amputations. Wounds are mirror-image wounds medially. 3/25; mirror-image wounds in the setting of previous transmetatarsal amputation. The wounds are mirror image medial foot wounds. We have been using silver collagen. We are using surgical shoes offloaded with felt. He came in today asking about his "$900" custom-made shoes although after talking to them it does not appear that he ever really wore these and they are more than a year old 4/8; mirror-image  wounds on the medial part of the plantar feet in the setting of previous transmetatarsal amputations. We have been using silver collagen. I aggressively debrided these 2 weeks ago removing callus subcutaneous tissue from around the wound margins. He did indeed bring echo he has custom-made shoes with transmetatarsal inserts. He has never worn these. He claims to be not spending a lot of time on his feet 4/15; mirror-image wounds on the plantar feet in the setting of previous transmetatarsal amputation. We have been using silver alginate. Culture that I did last time grew MRSA and Proteus. I have him on doxycycline. This was a deep tissue culture. The Proteus was not actively plated against doxycycline but was otherwise pansensitive I think the doxycycline would cover this. His wounds actually looks somewhat better. Rolled senescent edges still around the wound edges 4/29; mirror-image wounds on the plantar feet in the setting of previous bilateral transmetatarsal amputations we have been using silver alginate. Last time I saw him 2 weeks ago I gave him a course of doxycycline. Things seem to have cleaned up. This is in response to a deep tissue culture that showed MRSA and Proteus 5/6; wounds on the bilateral plantar feet in the setting of previous transmetatarsal amputations. We have been using silver alginate. Arrives in the clinic today with the same raised thick callus and subcutaneous tissue. He has home health changing the dressing. He brought up the fact that he has transportation through [facility] apparently he only has "12 visits". Otherwise he has the pay $40 for a cab ride there and back from his home which I guess [place] [address]. 11/11/19-Patient back with left transmet plantar surface much worse with extension of devitalized tissue proximally from the original wound with dense callus rim, the right is about the same, apparently patient was wearing tennis shoes at home per  home health, although patient swears that he has been using his offloading devices that he has with inserts. 6/8; I have not seen this patient in over a month however he was seen last week in my absence. He has mirror-image wounds over his bilateral transmit plantar surfaces. He has been using silver alginate. He comes in today with literally a mountain of callus around the 2 wounds  on the left. The area on the right somewhat better but still callus and nonviable tissue around the circular wound with some depth. He had an x-ray done last week of the left foot that was negative a culture showed multiple organisms. He did not get any antibiotics. He has custom-made shoes and apparently is getting another pair from [employer] that is in the process. I told him that we are not offloading the sufficiently for what ever reason. I do not think he is wearing his custom shoes at home although he says he is not walking that much 6/17 bilateral plantar foot in the setting of previous transmetatarsal amputations. He has been using silver alginate. He has his own custom made shoes which he is supposed to have it replaced in the next week or 2. We have been extensively debriding the callus around both wound sites and things look slightly better today 6/24; patient arrives in clinic today with her intake nurse pointing out odor and drainage. We have been using silver alginate. I was going to put a total contact cast on this leg today but in view of the odor and drainage will postpone that 7/1; intake nurse still points out odor and drainage. Last week I did a deep tissue culture of the right foot this grew a few methicillin resistant staph aureus, few group B strep and a few Pseudomonas. No anaerobes were isolated. He will require a course of 2 different antibiotics probably ciprofloxacin and doxycycline. As usual he has thick callus around the wound 7/15; copious amounts of callus. X-rays I did last time showed  chronic changes versus possible osteomyelitis at the plantar aspect of the proximal metatarsal. We will therefore go ahead with the MRI. 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 We have been using silver alginate. 7/22; patient's MRI is not until 01/19/2020. Still copious amounts of thick callus subcutaneous tissue from around the wound margins. We have been using silver alginate but apparently home health is not actually putting these in the wounds especially the right with the undermining area laterally 8/26; patient has not been here in over a month. He did not get his MRI done and he is not rebooked it. I am not exactly sure what he has been doing to these wounds on his bilateral feet. He comes in today with odor, thick callus around both wound areas. He is not really offloading this to any major degree. We have been using silver alginate 9/17; 3-week follow-up. The reason for his noncompliance with clinic visits is not really clear. He has never gotten his MRI done and to be honest I basically given up that he will participate in this. He is using silver alginate, he has home health changing this 3 times a week. He has diabetic boots but obviously he is not able to offload this properly. He arrives in with copious amounts of callus around the wounds. In addition he has a new wound on the left heel Electronic Signature(s) Signed: 03/06/2020 8:08:43 AM By: Linton Ham MD Entered By: Linton Ham on 03/03/2020 08:47:01 -------------------------------------------------------------------------------- Physical Exam Details Patient Name: Date of Service: [name], [name] 03/03/2020 8:30 A M Medical Record Number: [id number] Patient Account Number: [id number] Date of Birth/Sex: Treating RN: 1941/02/03 (79 y.o. [name] Primary Care Provider: [name] Other Clinician: Referring Provider: Treating  Provider/Extender: Gabriel Rainwater in Treatment: 32 Constitutional Sitting or standing  Blood Pressure is within target range for patient.. Pulse regular and within target range for patient.Marland Kitchen Respirations regular, non-labored and within target range.. Temperature is normal and within the target range for the patient.Marland Kitchen Appears in no distress. Notes Wound exam Copious amounts of callus around both wounds over the third metatarsal head. I used a #10 scalpel to remove the callus flaking skin. The wounds surprisingly as usual do not look too bad. Small open areas no overt infection no purulent drainage She has an area on the left heel which is more superficial. I used pickups and scissors to remove nonadherent skin around one edge of this Hemostasis in all areas with direct pressure Electronic Signature(s) Signed: 03/06/2020 8:08:43 AM By: Linton Ham MD Entered By: Linton Ham on 03/03/2020 08:48:17 -------------------------------------------------------------------------------- Physician Orders Details Patient Name: Date of Service: [name], [name] 03/03/2020 8:30 A M Medical Record Number: [id number] Patient Account Number: [id number] Date of Birth/Sex: Treating RN: 1941/06/13 (79 y.o. [name] Primary Care Provider: [name] Other Clinician: Referring Provider: Treating Provider/Extender: Gabriel Rainwater in Treatment: 40 Verbal / Phone Orders: No Diagnosis Coding ICD-10 Coding Code Description E11.621 Type 2 diabetes mellitus with foot ulcer L97.522 Non-pressure chronic ulcer of other part of left foot with fat layer exposed L97.512 Non-pressure chronic ulcer of other part of right foot with fat layer exposed I73.89 Other specified peripheral vascular diseases L03.115 Cellulitis of right lower limb Follow-up Appointments Return Appointment in 1 week. Dressing Change Frequency Wound #21  Left,Plantar Foot Change dressing three times week. Wound #22 Right Metatarsal head first Change dressing three times week. Wound #24 Left Calcaneus Change dressing three times week. Wound Cleansing Clean wound with Wound Cleanser - or normal saline Primary Wound Dressing Wound #21 Left,Plantar Foot Calcium Alginate with Silver Wound #22 Right Metatarsal head first lginate with Silver - ensure to pack into wound bed and any undermining Calcium A Wound #24 Left Calcaneus Calcium Alginate with Silver Secondary Dressing Wound #21 Left,Plantar Foot Foam - foam donut to offload Kerlix/Rolled Gauze Dry Gauze Wound #22 Right Metatarsal head first Foam - foam donut to offload Kerlix/Rolled Gauze Dry Gauze Wound #24 Left Calcaneus Kerlix/Rolled Gauze Dry Gauze Heel Cup Edema Control Avoid standing for long periods of time Elevate legs to the level of the heart or above for 30 minutes daily and/or when sitting, a frequency of: - throughout the day. Off-Loading Other: - felt patient's personal shoes. patient to wear diabetic shoes. patient to limit about of time spent walking and standing. [facility] for wound care. - [employer] [facility], maintenance) Signed: 03/06/2020 8:08:43 AM By: Linton Ham MD Signed: 03/06/2020 1:33:30 PM By: Kela Millin Entered By: Kela Millin on 03/03/2020 08:41:30 -------------------------------------------------------------------------------- Problem List Details Patient Name: Date of Service: [name], [name] 03/03/2020 8:30 A M Medical Record Number: [id number] Patient Account Number: [id number] Date of Birth/Sex: Treating RN: 05-12-1941 (79 y.o. [name] Primary Care Provider: [name] Other Clinician: Referring Provider: Treating Provider/Extender: Gabriel Rainwater in Treatment: 19 Active Problems ICD-10 Encounter Code Description Active  Date MDM Diagnosis E11.621 Type 2 diabetes mellitus with foot ulcer 07/21/2019 No Yes L97.522 Non-pressure chronic ulcer of other part of left foot with fat layer exposed 07/21/2019 No Yes L97.512 Non-pressure chronic ulcer of other part of right foot with fat layer exposed 07/21/2019 No Yes I73.89 Other specified peripheral vascular diseases 07/21/2019 No Yes L97.422 Non-pressure chronic ulcer of  left heel and midfoot with fat layer exposed 03/03/2020 No Yes Inactive Problems ICD-10 Code Description Active Date Inactive Date F17.218 Nicotine dependence, cigarettes, with other nicotine-induced disorders 07/21/2019 07/21/2019 I10 Essential (primary) hypertension 07/21/2019 07/21/2019 L84 Corns and callosities 07/21/2019 07/21/2019 L03.115 Cellulitis of right lower limb 12/16/2019 12/16/2019 Resolved Problems Electronic Signature(s) Signed: 03/06/2020 8:08:43 AM By: Linton Ham MD Entered By: Linton Ham on 03/03/2020 08:45:34 -------------------------------------------------------------------------------- Progress Note Details Patient Name: Date of Service: [name], [name] 03/03/2020 8:30 A M Medical Record Number: [id number] Patient Account Number: [id number] Date of Birth/Sex: Treating RN: 23-Jun-1940 (79 y.o. [name] Primary Care Provider: [name] Other Clinician: Referring Provider: Treating Provider/Extender: Gabriel Rainwater in Treatment: 32 Subjective History of Present Illness (HPI) very pleasant 79 year old gentleman has type 2 diabetes and has been having an ulcer on his foot for several years. Been seeing as this time around since the end of October. Classified as a Wagner grade 2 because his previous x-rays did not show any problems. He has an MRI pending this Friday. discussing with him he says he has not been able to get his diabetic shoes yet. He continues to smoke his pipe and drinks moonshine and says he will not give this up. I  have tried to counsel him regarding this but he firmly says that he is happy to listen to me but he is not going to give up his habits. 08/17/14 -- patient has no fresh complaints but has recently come with his MRI which was done on 08/12/2014. 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 Readmission: 07/21/2019 upon evaluation today patient presents for initial inspection here in our clinic concerning issues that he has been having with his bilateral feet. He has open wounds that been present at least since the beginning of the year although he does not know an exact time. He is previously had amputations of all toes and the distal portion of his foot. 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 With that being said he does have a history of diabetes, peripheral vascular disease, nicotine dependence, and hypertension. He has not had any recent arterial or vascular studies he was noncompressible today I think he is going to require referral for arterial studies as well. 2/11; patient was readmitted to our clinic last week. He has bilateral plantar foot wounds in the setting of previous transmetatarsal amputations. He had his arterial studies done on 2/8; these were actually quite good. He was noncompressible on both sides however his waveforms were triphasic. The thought was noncompressible vessels consistent with medial calcification but without significant stenosis in the lower extremities. X-rays were done on the left this showed extensive chronic and postoperative changes without evidence of osteomyelitis. There was no evidence of osteomyelitis on the right foot there was mild vascular calcification MRI was suggested on the left based on clinical correlation. The patient arrives in clinic with some odor and drainage from the left foot. Right foot measures smaller. He is really not  offloading these areas although he says he does not walk much 2/25; the patient arrives with some odor and drainage from the left foot again. Post debridement I cultured this area. He has the area on the midfoot roughly the third metatarsal head and the first metatarsal head on the right. He is in surgical shoes bilateral transmet 3/4; culture from last week showed abundant  group B strep and a few Proteus. We will put him on cephalexin starting today. Still silver alginate to the wounds 3/11; due to issues with pharmacy he still does not have the cephalexin that I prescribed a week ago. Apparently this was called into a different pharmacy on Friday but they have not delivered it to him. We have been using silver alginate on the wounds but because of lack of moisture change to silver collagen today. We are offloading as best we can in surgical shoes 3/18; he is taking the cephalexin that I prescribed 2 weeks ago. Using silver collagen. He has surgical shoes. Setting of bilateral previous transmetatarsal amputations. Wounds are mirror-image wounds medially. 3/25; mirror-image wounds in the setting of previous transmetatarsal amputation. The wounds are mirror image medial foot wounds. We have been using silver collagen. We are using surgical shoes offloaded with felt. He came in today asking about his "$900" custom-made shoes although after talking to them it does not appear that he ever really wore these and they are more than a year old 4/8; mirror-image wounds on the medial part of the plantar feet in the setting of previous transmetatarsal amputations. We have been using silver collagen. I aggressively debrided these 2 weeks ago removing callus subcutaneous tissue from around the wound margins. He did indeed bring echo he has custom-made shoes with transmetatarsal inserts. He has never worn these. He claims to be not spending a lot of time on his feet 4/15; mirror-image wounds on the plantar feet in  the setting of previous transmetatarsal amputation. We have been using silver alginate. Culture that I did last time grew MRSA and Proteus. I have him on doxycycline. This was a deep tissue culture. The Proteus was not actively plated against doxycycline but was otherwise pansensitive I think the doxycycline would cover this. His wounds actually looks somewhat better. Rolled senescent edges still around the wound edges 4/29; mirror-image wounds on the plantar feet in the setting of previous bilateral transmetatarsal amputations we have been using silver alginate. Last time I saw him 2 weeks ago I gave him a course of doxycycline. Things seem to have cleaned up. This is in response to a deep tissue culture that showed MRSA and Proteus 5/6; wounds on the bilateral plantar feet in the setting of previous transmetatarsal amputations. We have been using silver alginate. Arrives in the clinic today with the same raised thick callus and subcutaneous tissue. He has home health changing the dressing. He brought up the fact that he has transportation through [facility] apparently he only has "12 visits". Otherwise he has the pay $40 for a cab ride there and back from his home which I guess [place] [address]. 11/11/19-Patient back with left transmet plantar surface much worse with extension of devitalized tissue proximally from the original wound with dense callus rim, the right is about the same, apparently patient was wearing tennis shoes at home per home health, although patient swears that he has been using his offloading devices that he has with inserts. 6/8; I have not seen this patient in over a month however he was seen last week in my absence. He has mirror-image wounds over his bilateral transmit plantar surfaces. He has been using silver alginate. He comes in today with literally a mountain of callus around the 2 wounds on the left. The area on the right somewhat better but still callus and  nonviable tissue around the circular wound with some depth. He had an  x-ray done last week of the left foot that was negative a culture showed multiple organisms. He did not get any antibiotics. He has custom-made shoes and apparently is getting another pair from [employer] that is in the process. I told him that we are not offloading the sufficiently for what ever reason. I do not think he is wearing his custom shoes at home although he says he is not walking that much 6/17 bilateral plantar foot in the setting of previous transmetatarsal amputations. He has been using silver alginate. He has his own custom made shoes which he is supposed to have it replaced in the next week or 2. We have been extensively debriding the callus around both wound sites and things look slightly better today 6/24; patient arrives in clinic today with her intake nurse pointing out odor and drainage. We have been using silver alginate. I was going to put a total contact cast on this leg today but in view of the odor and drainage will postpone that 7/1; intake nurse still points out odor and drainage. Last week I did a deep tissue culture of the right foot this grew a few methicillin resistant staph aureus, few group B strep and a few Pseudomonas. No anaerobes were isolated. He will require a course of 2 different antibiotics probably ciprofloxacin and doxycycline. As usual he has thick callus around the wound 7/15; copious amounts of callus. X-rays I did last time showed chronic changes versus possible osteomyelitis at the plantar aspect of the proximal metatarsal. We will therefore go ahead with the MRI. 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 We have been using silver alginate. 7/22; patient's MRI is not until 01/19/2020. Still copious amounts of thick callus subcutaneous tissue from around the wound margins. We have been using silver alginate but  apparently home health is not actually putting these in the wounds especially the right with the undermining area laterally 8/26; patient has not been here in over a month. He did not get his MRI done and he is not rebooked it. I am not exactly sure what he has been doing to these wounds on his bilateral feet. He comes in today with odor, thick callus around both wound areas. He is not really offloading this to any major degree. We have been using silver alginate 9/17; 3-week follow-up. The reason for his noncompliance with clinic visits is not really clear. He has never gotten his MRI done and to be honest I basically given up that he will participate in this. He is using silver alginate, he has home health changing this 3 times a week. He has diabetic boots but obviously he is not able to offload this properly. He arrives in with copious amounts of callus around the wounds. In addition he has a new wound on the left heel Objective Constitutional Sitting or standing Blood Pressure is within target range for patient.. Pulse regular and within target range for patient.Marland Kitchen Respirations regular, non-labored and within target range.. Temperature is normal and within the target range for the patient.Marland Kitchen Appears in no distress. Vitals Time Taken: 8:27 AM, Height: 73 in, Weight: 180 lbs, BMI: 23.7, Temperature: 98.5 F, Pulse: 71 bpm, Respiratory Rate: 18 breaths/min, Blood Pressure: 128/69 mmHg. General Notes: Wound exam ooCopious amounts of callus around both wounds over the third metatarsal head. I used a #10 scalpel to remove the callus flaking skin. The wounds surprisingly as usual do not look  too bad. Small open areas no overt infection no purulent drainage ooShe has an area on the left heel which is more superficial. I used pickups and scissors to remove nonadherent skin around one edge of this ooHemostasis in all areas with direct pressure Integumentary (Hair, Skin) Wound #21 status is Open.  Original cause of wound was Gradually Appeared. The wound is located on the [place]. The wound measures 1cm length x 0.3cm width x 0.4cm depth; 0.236cm^2 area and 0.094cm^3 volume. There is Fat Layer (Subcutaneous Tissue) exposed. There is no tunneling or undermining noted. There is a medium amount of serosanguineous drainage noted. Foul odor after cleansing was noted. The wound margin is thickened. There is large (67- 100%) pink granulation within the wound bed. There is no necrotic tissue within the wound bed. Wound #22 status is Open. Original cause of wound was Gradually Appeared. The wound is located on the Right Metatarsal head first. The wound measures 0.6cm length x 0.6cm width x 0.3cm depth; 0.283cm^2 area and 0.085cm^3 volume. There is Fat Layer (Subcutaneous Tissue) exposed. There is no tunneling or undermining noted. There is a medium amount of serosanguineous drainage noted. Foul odor after cleansing was noted. The wound margin is thickened. There is large (67-100%) red granulation within the wound bed. There is no necrotic tissue within the wound bed. Wound #24 status is Open. Original cause of wound was Gradually Appeared. The wound is located on the Left Calcaneus. The wound measures 2.9cm length x 0.4cm width x 0.3cm depth; 0.911cm^2 area and 0.273cm^3 volume. There is Fat Layer (Subcutaneous Tissue) exposed. There is no tunneling or undermining noted. There is a medium amount of serosanguineous drainage noted. There is small (1-33%) pink granulation within the wound bed. There is a large (67-100%) amount of necrotic tissue within the wound bed including Adherent Slough. 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 There was a Selective/Open Wound Non-Viable Tissue Debridement with a total area of 16 sq cm performed by Ricard Dillon., MD. With the following instrument(s): Blade to remove Non-Viable tissue/material. Material removed includes Callus after achieving pain control using Other (benzocaine, 20%). No specimens were taken. A time out was conducted at 08:34, prior to the start of the procedure. A Minimum amount of bleeding was controlled with Pressure. The procedure was tolerated well with a pain level of 0 throughout and a pain level of 0 following the procedure. Post Debridement Measurements: 1cm length x 0.3cm width x 0.4cm depth; 0.094cm^3 volume. Character of Wound/Ulcer Post Debridement is improved. Severity of Tissue Post Debridement is: Fat layer exposed. 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 There was a Selective/Open Wound Non-Viable Tissue Debridement with a total area of 4 sq cm performed by Ricard Dillon., MD. With the following instrument(s): Blade to remove Non-Viable tissue/material. Material removed includes Callus after achieving pain control using Other (benzocaine,  20%). No specimens were taken. A time out was conducted at 08:35, prior to the start of the procedure. A Minimum amount of bleeding was controlled with Pressure. The procedure was tolerated well with a pain level of 0 throughout and a pain level of 0 following the procedure. Post Debridement Measurements: 0.6cm length x 0.6cm width x 0.3cm depth; 0.085cm^3  volume. Character of Wound/Ulcer Post Debridement is improved. Severity of Tissue Post Debridement is: Fat layer exposed. Post procedure Diagnosis Wound #22: Same as Pre-Procedure Wound #24 Pre-procedure diagnosis of Wound #24 is a Diabetic Wound/Ulcer of the Lower Extremity located on the Left Calcaneus .Severity of Tissue Pre Debridement is: Fat layer exposed. There was a Selective/Open Wound Skin/Epidermis Debridement with a total area of 1.16 sq cm performed by Ricard Dillon., MD. With the following instrument(s): Blade to remove Viable and Non-Viable tissue/material. Material removed includes Callus and Skin: Epidermis and after achieving pain control using Other (benzocaine, 20%). No specimens were taken. A time out was conducted at 08:34, prior to the start of the procedure. A Minimum amount of bleeding was controlled with Pressure. The procedure was tolerated well with a pain level of 0 throughout and a pain level of 0 following the procedure. Post Debridement Measurements: 2.9cm length x 0.4cm width x 0.3cm depth; 0.273cm^3 volume. Character of Wound/Ulcer Post Debridement is improved. Severity of Tissue Post Debridement is: Fat layer exposed. Post procedure Diagnosis Wound #24: Same as Pre-Procedure Plan Follow-up Appointments: Return Appointment in 1 week. Dressing Change Frequency: Wound #21 Left,Plantar Foot: Change dressing three times week. Wound #22 Right Metatarsal head first: Change dressing three times week. Wound #24 Left Calcaneus: Change dressing three times week. Wound Cleansing: Clean wound with Wound Cleanser - or normal saline Primary Wound Dressing: Wound #21 Left,Plantar Foot: Calcium Alginate with Silver Wound #22 Right Metatarsal head first: Calcium Alginate with Silver - ensure to pack into wound bed and any undermining Wound #24 Left Calcaneus: Calcium Alginate with Silver Secondary Dressing: Wound #21 Left,Plantar Foot: Foam - foam donut to  offload Kerlix/Rolled Gauze Dry Gauze Wound #22 Right Metatarsal head first: Foam - foam donut to offload Kerlix/Rolled Gauze Dry Gauze Wound #24 Left Calcaneus: Kerlix/Rolled Gauze Dry Gauze Heel Cup Edema Control: Avoid standing for long periods of time Elevate legs to the level of the heart or above for 30 minutes daily and/or when sitting, a frequency of: - throughout the day. Off-Loading: Other: - felt patient's personal shoes. patient to wear diabetic shoes. patient to limit about of time spent walking and standing. Home Health: [facility] for [facility] care. - [facility]. We applied silver alginate to all wound areas 2. I explained to this patient the risks we are dealing with vis--vis these wounds getting infected. 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 Far too much callus to consider final healing. 3. In spite of his erratic attendance in the clinic I still would like to put a total contact cast on the left foot. I have explained this to him. But I want to make sure that he at least attends the next couple of weeks before we do this. Electronic Signature(s) Signed: 03/06/2020 8:08:43 AM By: Linton Ham MD Entered By: Linton Ham on 03/03/2020 08:49:38 -------------------------------------------------------------------------------- SuperBill Details Patient Name: Date of Service: [name], [name] 03/03/2020 Medical Record Number: [id number] Patient Account Number: [id number] Date of Birth/Sex: Treating  RN: 25-Apr-1941 (79 y.o. [name] Primary Care Provider: [name] Other Clinician: Referring Provider: Treating Provider/Extender: Gabriel Rainwater in Treatment: 32 Diagnosis Coding ICD-10 Codes Code Description E11.621 Type 2 diabetes mellitus with foot ulcer L97.522 Non-pressure  chronic ulcer of other part of left foot with fat layer exposed L97.512 Non-pressure chronic ulcer of other part of right foot with fat layer exposed I73.89 Other specified peripheral vascular diseases L97.422 Non-pressure chronic ulcer of left heel and midfoot with fat layer exposed Facility Procedures The patient participates with Medicare or their insurance follows the Medicare Facility Guidelines: CPT4 Code Description Modifier Quantity [id number] - DEBRIDE WOUND 1ST 20 SQ CM OR < 1 ICD-10 Diagnosis Description L97.522 Non-pressure chronic ulcer  of other part of left foot with fat layer exposed L97.512 Non-pressure chronic ulcer of other part of right foot with fat layer exposed L97.422 Non-pressure chronic ulcer of left heel and midfoot with fat layer exposed The patient participates with Medicare or their insurance follows the Medicare Facility Guidelines: [id number] - DEBRIDE WOUND EA ADDL 20 SQ CM 1 ICD-10 Diagnosis Description L97.522 Non-pressure chronic ulcer of other part of left foot with fat  layer exposed L97.512 Non-pressure chronic ulcer of other part of right foot with fat layer exposed L97.422 Non-pressure chronic ulcer of left heel and midfoot with fat layer exposed Physician Procedures : CPT4 Code Description Modifier [id number] - WC PHYS DEBR WO ANESTH 20 SQ CM ICD-10 Diagnosis Description L97.522 Non-pressure chronic ulcer of other part of left foot with fat layer exposed L97.512 Non-pressure chronic ulcer of other part of right  foot with fat layer exposed L97.422 Non-pressure chronic ulcer of left heel and midfoot with fat layer exposed Quantity: 1 : [id number] - WC PHYS DEBR WO ANESTH EA ADD 20 CM ICD-10 Diagnosis Description L97.522 Non-pressure chronic ulcer of other part of left foot with fat layer exposed L97.512 Non-pressure chronic ulcer of other part of right foot with fat layer exposed  L97.422 Non-pressure chronic ulcer of left heel and midfoot with fat  layer exposed Quantity: 1 Electronic Signature(s) Signed: 03/06/2020 8:08:43 AM By: Linton Ham MD Entered By: Linton Ham on 03/03/2020 08:50:04

## 2020-03-06 NOTE — Progress Notes (Signed)
[name], [name] ([id number]) Visit Report for 03/03/2020 Arrival Information Details Patient Name: Date of Service: [name] RE, [place] 03/03/2020 8:30 A M Medical Record Number: [id number] Patient Account Number: [id number] Date of Birth/Sex: Treating RN: 1940/11/07 (79 y.o. [name]) [name] Primary Care Tykwon Fera: [name] Other Clinician: Referring Dayron Odland: Treating Marthena Whitmyer/Extender: Gabriel Rainwater in Treatment: 59 Visit Information History Since Last Visit All ordered tests and consults were completed: No Patient Arrived: [name] Added or deleted any medications: No Arrival Time: 08:27 Any new allergies or adverse reactions: No Accompanied By: self Had a fall or experienced change in No Transfer Assistance: None activities of daily living that may affect Patient Identification Verified: Yes risk of falls: Secondary Verification Process Completed: Yes Signs or symptoms of abuse/neglect since last visito No Patient Requires Transmission-Based Precautions: No Hospitalized since last visit: No Patient Has Alerts: Yes Implantable device outside of the clinic excluding No Patient Alerts: R ABI non compressible cellular tissue based products placed in the center L ABI non compressible since last visit: Has Dressing in Place as Prescribed: Yes Pain Present Now: No Electronic Signature(s) Signed: 03/06/2020 1:15:48 PM By: [name] RN Entered By: [name] on 03/03/2020 08:27:31 -------------------------------------------------------------------------------- Encounter Discharge Information Details Patient Name: Date of Service: [name] RE, [name] 03/03/2020 8:30 A M Medical Record Number: [id number] Patient Account Number: [id number] Date of Birth/Sex: Treating RN: 05-07-1941 (79 y.o. [name] Primary Care Amin Fornwalt: [name] Other Clinician: Referring Mell Guia: Treating Chaze Hruska/Extender: Gabriel Rainwater in Treatment: 4 Encounter Discharge Information Items Post Procedure Vitals Discharge Condition: Stable Temperature (F): 98.5 Ambulatory Status: Cane Pulse (bpm): 71 Discharge Destination: Home Respiratory Rate (breaths/min): 18 Transportation: Private Auto Blood Pressure (mmHg): 128/69 Accompanied By: self Schedule Follow-up Appointment: Yes Clinical Summary of Care: Electronic Signature(s) Signed: 03/03/2020 5:49:45 PM By: Deon Pilling Entered By: Deon Pilling on 03/03/2020 09:39:12 -------------------------------------------------------------------------------- Lower Extremity Assessment Details Patient Name: Date of Service: [name] RE, [name] 03/03/2020 8:30 A M Medical Record Number: [id number] Patient Account Number: [id number] Date of Birth/Sex: Treating RN: 1940-07-06 (79 y.o. [name]) [name] Primary Care Malcolm Hetz: [name] Other Clinician: Referring Phylliss Strege: Treating Lexxie Winberg/Extender: Wynelle Bourgeois, Ronie Spies Weeks in Treatment: 32 Edema Assessment Assessed: [name]: No] [Right: No] Edema: [Left: Yes] [Right: Yes] Calf Left: Right: Point of Measurement: 32 cm From Medial Instep 30.5 cm 33 cm Ankle Left: Right: Point of Measurement: 10 cm From Medial Instep 22.5 cm 23.5 cm Electronic Signature(s) Signed: 03/06/2020 1:15:48 PM By: [name] RN Entered By: [name] on 03/03/2020 08:28:15 -------------------------------------------------------------------------------- Multi Wound Chart Details Patient Name: Date of Service: [name] RE, [name] 03/03/2020 8:30 A M Medical Record Number: [id number] Patient Account Number: [id number] Date of Birth/Sex: Treating RN: July 26, 1940 (79 y.o. [name] Primary Care Leira Regino: [name] Other Clinician: Referring Marji Kuehnel: Treating Aphrodite Harpenau/Extender: Gabriel Rainwater in Treatment: 32 Vital Signs Height(in):  73 Pulse(bpm): 49 Weight(lbs): 180 Blood Pressure(mmHg): 128/69 Body Mass Index(BMI): 24 Temperature(F): 98.5 Respiratory Rate(breaths/min): 18 Photos: [21:No Photos Left, Plantar Foot] [22:No Photos Right Metatarsal head first] [24:No Photos Left Calcaneus] Wound Location: [21:Gradually Appeared] [22:Gradually Appeared] [24:Gradually Appeared] Wounding Event: [21:Diabetic Wound/Ulcer of the Lower] [22:Diabetic Wound/Ulcer of the Lower] [24:Diabetic Wound/Ulcer of the Lower] Primary Etiology: [21:Extremity Cataracts, Glaucoma, Hypertension,] [22:Extremity Cataracts, Glaucoma, Hypertension,] [24:Extremity Cataracts, Glaucoma, Hypertension,] Comorbid History: [21:Peripheral Arterial Disease, Type II Diabetes, Osteomyelitis, Neuropathy 06/17/2018] [22:Peripheral Arterial Disease, Type II Diabetes, Osteomyelitis, Neuropathy 06/17/2018] [24:Peripheral  Arterial Disease, Type II Diabetes,  Osteomyelitis, Neuropathy 02/28/2020] Date Acquired: [21:32] [22:32] [24:0] Weeks of Treatment: [21:Open] [22:Open] [24:Open] Wound Status: [21:1x0.3x0.4] [22:0.6x0.6x0.3] [24:2.9x0.4x0.3] Measurements L x W x D (cm) [21:0.236] [22:0.283] [24:0.911] A (cm) : rea [21:0.094] [22:0.085] [24:0.273] Volume (cm) : [21:89.80%] [22:54.90%] [24:N/A] % Reduction in A rea: [21:91.90%] [22:54.80%] [24:N/A] % Reduction in Volume: [21:Grade 2] [22:Grade 2] [24:Grade 2] Classification: [21:Medium] [22:Medium] [24:Medium] Exudate A mount: [21:Serosanguineous] [22:Serosanguineous] [24:Serosanguineous] Exudate Type: [21:red, brown] [22:red, brown] [24:red, brown] Exudate Color: [21:Yes] [22:Yes] [24:No] Foul Odor A Cleansing: [21:fter No] [22:No] [24:N/A] Odor Anticipated Due to Product Use: [21:Thickened] [22:Thickened] [24:N/A] Wound Margin: [21:Large (67-100%)] [22:Large (67-100%)] [24:Small (1-33%)] Granulation A mount: [21:Pink] [22:Red] [24:Pink] Granulation Quality: [21:None Present (0%)] [22:None Present (0%)]  [24:Large (67-100%)] Necrotic Amount: [21:Fat Layer (Subcutaneous Tissue): Yes Fat Layer (Subcutaneous Tissue): Yes Fat Layer (Subcutaneous Tissue): Yes] Exposed Structures: [21:Fascia: No Tendon: No Muscle: No Joint: No Bone: No None] [22:Fascia: No Tendon: No Muscle: No Joint: No Bone: No Small (1-33%)] [24:Fascia: No Tendon: No Muscle: No Joint: No Bone: No None] Epithelialization: [21:Debridement - Selective/Open Wound Debridement - Selective/Open Wound Debridement - Selective/Open Wound] Debridement: Pre-procedure Verification/Time Out 08:34 [22:08:35] [33:29:51] Taken: [21:Other] [22:Other] [24:Other] Pain Control: [21:Callus] [22:Callus] [24:Callus] Tissue Debrided: [21:Non-Viable Tissue] [22:Non-Viable Tissue] [24:Skin/Epidermis] Level: [21:16] [22:4] [24:1.16] Debridement A (sq cm): [21:rea Blade] [22:Blade] [24:Blade] Instrument: [21:Minimum] [22:Minimum] [24:Minimum] Bleeding: [21:Pressure] [22:Pressure] [24:Pressure] Hemostasis A chieved: [21:0] [22:0] [24:0] Procedural Pain: [21:0] [22:0] [24:0] Post Procedural Pain: [21:Procedure was tolerated well] [22:Procedure was tolerated well] [24:Procedure was tolerated well] Debridement Treatment Response: [21:1x0.3x0.4] [22:0.6x0.6x0.3] [24:2.9x0.4x0.3] Post Debridement Measurements L x W x D (cm) [21:0.094] [22:0.085] [24:0.273] Post Debridement Volume: (cm) [21:Debridement] [22:Debridement] [24:Debridement] Treatment Notes Electronic Signature(s) Signed: 03/06/2020 8:08:43 AM By: Linton Ham MD Signed: 03/06/2020 1:33:30 PM By: Kela Millin Entered By: Linton Ham on 03/03/2020 08:45:42 -------------------------------------------------------------------------------- Multi-Disciplinary Care Plan Details Patient Name: Date of Service: [name] RE, [name] 03/03/2020 8:30 A M Medical Record Number: [id number] Patient Account Number: [id number] Date of Birth/Sex: Treating RN: 06-Oct-1940 (79 y.o. [name] Primary Care Deshon Koslowski: [name] Other Clinician: Referring Chriselda Leppert: Treating Delecia Vastine/Extender: Gabriel Rainwater in Treatment: 32 Active Inactive Wound/Skin Impairment Nursing Diagnoses: Impaired tissue integrity Knowledge deficit related to smoking impact on wound healing Knowledge deficit related to ulceration/compromised skin integrity Goals: Patient will demonstrate a reduced rate of smoking or cessation of smoking Date Initiated: 07/21/2019 Date Inactivated: 11/23/2019 Target Resolution Date: 11/12/2019 Goal Status: Met Patient/caregiver will verbalize understanding of skin care regimen Date Initiated: 07/21/2019 Target Resolution Date: 03/10/2020 Goal Status: Active Ulcer/skin breakdown will have a volume reduction of 30% by week 4 Date Initiated: 07/21/2019 Date Inactivated: 09/23/2019 Target Resolution Date: 09/17/2019 Unmet Reason: see wound Goal Status: Unmet Goal Status: Unmet measurements. Interventions: Assess patient/caregiver ability to obtain necessary supplies Assess patient/caregiver ability to perform ulcer/skin care regimen upon admission and as needed Assess ulceration(s) every visit Provide education on smoking Provide education on ulcer and skin care Treatment Activities: Skin care regimen initiated : 07/21/2019 Topical wound management initiated : 07/21/2019 Notes: Electronic Signature(s) Signed: 03/06/2020 1:33:30 PM By: Kela Millin Entered By: Kela Millin on 03/03/2020 08:17:57 -------------------------------------------------------------------------------- Pain Assessment Details Patient Name: Date of Service: [name] RE, [name] 03/03/2020 8:30 A M Medical Record Number: [id number] Patient Account Number: [id number] Date of Birth/Sex: Treating RN: July 04, 1940 (79 y.o. [name]) [name] Primary Care Waynetta Metheny: [name] Other Clinician: Referring Emmely Bittinger: Treating Makenze Ellett/Extender:  Wynelle Bourgeois, [name]  in Treatment: 32 Active Problems Location of Pain Severity and Description of Pain Patient Has Paino No Site Locations Pain Management and Medication Current Pain Management: Electronic Signature(s) Signed: 03/06/2020 1:15:48 PM By: [name] RN Entered By: [name] on 03/03/2020 08:28:05 -------------------------------------------------------------------------------- Patient/Caregiver Education Details Patient Name: Date of Service: [name] RE, [name] 9/17/2021andnbsp8:30 A M Medical Record Number: [id number] Patient Account Number: [id number] Date of Birth/Gender: Treating RN: 04-26-1941 (79 y.o. [name] Primary Care Physician: [name] Other Clinician: Referring Physician: Treating Physician/Extender: Gabriel Rainwater in Treatment: 44 Education Assessment Education Provided To: Patient Education Topics Provided Wound/Skin Impairment: Handouts: Caring for Your Ulcer Methods: Explain/Verbal Responses: State content correctly Electronic Signature(s) Signed: 03/06/2020 1:33:30 PM By: Kela Millin Entered By: Kela Millin on 03/03/2020 08:18:10 -------------------------------------------------------------------------------- Wound Assessment Details Patient Name: Date of Service: [name] RE, [name] 03/03/2020 8:30 A M Medical Record Number: [id number] Patient Account Number: [id number] Date of Birth/Sex: Treating RN: Apr 22, 1941 (79 y.o. [name]) [name] Primary Care Shakeira Rhee: [name] Other Clinician: Referring Devin Ganaway: Treating Deyton Ellenbecker/Extender: Marlow Baars Weeks in Treatment: 32 Wound Status Wound Number: 21 Primary Diabetic Wound/Ulcer of the Lower Extremity Etiology: Wound Location: Left, Plantar Foot Wound Open Wounding Event: Gradually Appeared Status: Date Acquired: 06/17/2018 Comorbid Cataracts, Glaucoma,  Hypertension, Peripheral Arterial Disease, Weeks Of Treatment: 32 History: Type II Diabetes, Osteomyelitis, Neuropathy Clustered Wound: No Wound Measurements Length: (cm) 1 Width: (cm) 0.3 Depth: (cm) 0.4 Area: (cm) 0.236 Volume: (cm) 0.094 % Reduction in Area: 89.8% % Reduction in Volume: 91.9% Epithelialization: None Tunneling: No Undermining: No Wound Description Classification: Grade 2 Wound Margin: Thickened Exudate Amount: Medium Exudate Type: Serosanguineous Exudate Color: red, brown Foul Odor After Cleansing: Yes Due to Product Use: No Slough/Fibrino No Wound Bed Granulation Amount: Large (67-100%) Exposed Structure Granulation Quality: Pink Fascia Exposed: No Necrotic Amount: None Present (0%) Fat Layer (Subcutaneous Tissue) Exposed: Yes Tendon Exposed: No Muscle Exposed: No Joint Exposed: No Bone Exposed: No Treatment Notes Wound #21 (Left, Plantar Foot) 1. Cleanse With Wound Cleanser 3. Primary Dressing Applied Calcium Alginate Ag 4. Secondary Dressing Dry Gauze Roll Gauze Foam 5. Secured With Medipore tape Notes foam donut as secondary. Electronic Signature(s) Signed: 03/06/2020 1:15:48 PM By: [name] RN Entered By: [name] on 03/03/2020 08:26:44 -------------------------------------------------------------------------------- Wound Assessment Details Patient Name: Date of Service: [name] RE, [name] 03/03/2020 8:30 A M Medical Record Number: [id number] Patient Account Number: [id number] Date of Birth/Sex: Treating RN: Dec 08, 1940 (79 y.o. [name]) [name] Primary Care Sereniti Wan: [name] Other Clinician: Referring Moriah Loughry: Treating Shean Gerding/Extender: Marlow Baars Weeks in Treatment: 32 Wound Status Wound Number: 22 Primary Diabetic Wound/Ulcer of the Lower Extremity Etiology: Wound Location: Right Metatarsal head first Wound Open Wounding Event: Gradually Appeared Status: Date Acquired:  06/17/2018 Comorbid Cataracts, Glaucoma, Hypertension, Peripheral Arterial Disease, Weeks Of Treatment: 32 History: Type II Diabetes, Osteomyelitis, Neuropathy Clustered Wound: No Wound Measurements Length: (cm) 0.6 Width: (cm) 0.6 Depth: (cm) 0.3 Area: (cm) 0.283 Volume: (cm) 0.085 % Reduction in Area: 54.9% % Reduction in Volume: 54.8% Epithelialization: Small (1-33%) Tunneling: No Undermining: No Wound Description Classification: Grade 2 Wound Margin: Thickened Exudate Amount: Medium Exudate Type: Serosanguineous Exudate Color: red, brown Foul Odor After Cleansing: Yes Due to Product Use: No Slough/Fibrino No Wound Bed Granulation Amount: Large (67-100%) Exposed Structure Granulation Quality: Red Fascia Exposed: No Necrotic Amount: None Present (0%) Fat Layer (Subcutaneous Tissue) Exposed: Yes Tendon Exposed: No Muscle Exposed: No Joint  Exposed: No Bone Exposed: No Treatment Notes Wound #22 (Right Metatarsal head first) 1. Cleanse With Wound Cleanser 3. Primary Dressing Applied Calcium Alginate Ag 4. Secondary Dressing Dry Gauze Roll Gauze Foam 5. Secured With Medipore tape Notes foam donut as secondary. Electronic Signature(s) Signed: 03/06/2020 1:15:48 PM By: [name] RN Entered By: [name] on 03/03/2020 08:26:54 -------------------------------------------------------------------------------- Wound Assessment Details Patient Name: Date of Service: [name] RE, [name] 03/03/2020 8:30 A M Medical Record Number: [id number] Patient Account Number: [id number] Date of Birth/Sex: Treating RN: [birth year] (79 y.o. [name]) [name] Primary Care Hymie Gorr: [name] Other Clinician: Referring Cabot Cromartie: Treating Tredarius Cobern/Extender: Marlow Baars Weeks in Treatment: 32 Wound Status Wound Number: 24 Primary Diabetic Wound/Ulcer of the Lower Extremity Etiology: Wound Location: Left Calcaneus Wound Open Wounding Event:  Gradually Appeared Status: Date Acquired: 02/28/2020 Comorbid Cataracts, Glaucoma, Hypertension, Peripheral Arterial Disease, Weeks Of Treatment: 0 History: Type II Diabetes, Osteomyelitis, Neuropathy Clustered Wound: No Wound Measurements Length: (cm) 2.9 Width: (cm) 0.4 Depth: (cm) 0.3 Area: (cm) 0.911 Volume: (cm) 0.273 % Reduction in Area: % Reduction in Volume: Epithelialization: None Tunneling: No Undermining: No Wound Description Classification: Grade 2 Exudate Amount: Medium Exudate Type: Serosanguineous Exudate Color: red, brown Foul Odor After Cleansing: No Slough/Fibrino Yes Wound Bed Granulation Amount: Small (1-33%) Exposed Structure Granulation Quality: Pink Fascia Exposed: No Necrotic Amount: Large (67-100%) Fat Layer (Subcutaneous Tissue) Exposed: Yes Necrotic Quality: Adherent Slough Tendon Exposed: No Muscle Exposed: No Joint Exposed: No Bone Exposed: No Treatment Notes Wound #24 (Left Calcaneus) 1. Cleanse With Wound Cleanser 3. Primary Dressing Applied Calcium Alginate Ag 4. Secondary Dressing Dry Gauze Roll Gauze Heel Cup 5. Secured With [employer]) Signed: 03/06/2020 1:15:48 PM By: [name] RN Entered By: [name] on 03/03/2020 08:26:08 -------------------------------------------------------------------------------- Vitals Details Patient Name: Date of Service: [name] RE, [name] 03/03/2020 8:30 A M Medical Record Number: [id number] Patient Account Number: [id number] Date of Birth/Sex: Treating RN: August 29, 1940 (79 y.o. [name]) [name] Primary Care Dietrick Barris: [name] Other Clinician: Referring Kelia Gibbon: Treating Woodley Petzold/Extender: Gabriel Rainwater in Treatment: 32 Vital Signs Time Taken: 08:27 Temperature (F): 98.5 Height (in): 73 Pulse (bpm): 71 Weight (lbs): 180 Respiratory Rate (breaths/min): 18 Body Mass Index (BMI): 23.7 Blood Pressure (mmHg):  128/69 Reference Range: 80 - 120 mg / dl Electronic Signature(s) Signed: 03/06/2020 1:15:48 PM By: [name] RN Entered By: [name] on 03/03/2020 08:27:58

## 2020-03-10 ENCOUNTER — Encounter (HOSPITAL_BASED_OUTPATIENT_CLINIC_OR_DEPARTMENT_OTHER): Payer: Medicare Other | Admitting: Internal Medicine

## 2020-03-10 DIAGNOSIS — E11621 Type 2 diabetes mellitus with foot ulcer: Secondary | ICD-10-CM | POA: Diagnosis not present

## 2020-03-12 NOTE — Progress Notes (Signed)
[name], [name] ([id number]) Visit Report for 03/10/2020 Arrival Information Details Patient Name: Date of Service: [name], [place] [name] 03/10/2020 8:30 A M Medical Record Number: [id number] Patient Account Number: [id number] Date of Birth/Sex: Treating RN: 01-02-1941 (79 y.o. [name]) [name] Primary Care Artavia Jeanlouis: [name] Other Clinician: Referring Taeshaun Rames: Treating Constantine Ruddick/Extender: Gabriel Rainwater in Treatment: 71 Visit Information History Since Last Visit All ordered tests and consults were completed: No Patient Arrived: [name] Added or deleted any medications: No Arrival Time: 08:15 Any new allergies or adverse reactions: No Accompanied By: self Had a fall or experienced change in No Transfer Assistance: None activities of daily living that may affect Patient Identification Verified: Yes risk of falls: Secondary Verification Process Completed: Yes Signs or symptoms of abuse/neglect since last visito No Patient Requires Transmission-Based Precautions: No Hospitalized since last visit: No Patient Has Alerts: Yes Implantable device outside of the clinic excluding No Patient Alerts: R ABI non compressible cellular tissue based products placed in the center L ABI non compressible since last visit: Has Dressing in Place as Prescribed: Yes Pain Present Now: No Electronic Signature(s) Signed: 03/10/2020 4:51:09 PM By: [name] RN Entered By: [name] on 03/10/2020 08:15:57 -------------------------------------------------------------------------------- Clinic Level of Care Assessment Details Patient Name: Date of Service: [name] [name], [name] 03/10/2020 8:30 A M Medical Record Number: [id number] Patient Account Number: [id number] Date of Birth/Sex: Treating RN: [birth year] (79 y.o. [name] Primary Care Kalysta Kneisley: [name] Other Clinician: Referring Dajour Pierpoint: Treating Whitman Meinhardt/Extender: Gabriel Rainwater in Treatment: 33 Clinic Level of Care Assessment Items TOOL 4 Quantity Score X- 1 0 Use when only an EandM is performed on FOLLOW-UP visit ASSESSMENTS - Nursing Assessment / Reassessment X- 1 10 Reassessment of Co-morbidities (includes updates in patient status) X- 1 5 Reassessment of Adherence to Treatment Plan ASSESSMENTS - Wound and Skin A ssessment / Reassessment _0  - 0 Simple Wound Assessment / Reassessment - one wound X- 3 5 Complex Wound Assessment / Reassessment - multiple wounds _1  - 0 Dermatologic / Skin Assessment (not related to wound area) ASSESSMENTS - Focused Assessment X- 2 5 Circumferential Edema Measurements - multi extremities _2  - 0 Nutritional Assessment / Counseling / Intervention _3  - 0 Lower Extremity Assessment (monofilament, tuning fork, pulses) _4  - 0 Peripheral Arterial Disease Assessment (using hand held doppler) ASSESSMENTS - Ostomy and/or Continence Assessment and Care _5  - 0 Incontinence Assessment and Management _6  - 0 Ostomy Care Assessment and Management (repouching, etc.) PROCESS - Coordination of Care X - Simple Patient / Family Education for ongoing care 1 15 _7  - 0 Complex (extensive) Patient / Family Education for ongoing care X- 1 10 Staff obtains Programmer, systems, Records, T Results / Process Orders est X- 1 10 Staff telephones HHA, Nursing Homes / Clarify orders / etc _8  - 0 Routine Transfer to another Facility (non-emergent condition) _9  - 0 Routine Hospital Admission (non-emergent condition) _10  - 0 New Admissions / Biomedical engineer / Ordering NPWT Apligraf, etc. , _11  - 0 Emergency Hospital Admission (emergent condition) X- 1 10 Simple Discharge Coordination _12  - 0 Complex (extensive) Discharge Coordination PROCESS - Special Needs _13  - 0 Pediatric / Minor Patient Management _14  - 0 Isolation Patient Management _15  - 0 Hearing / Language / Visual special needs _16  - 0 Assessment  of Community assistance (transportation, D/C planning, etc.) _17  - 0 Additional assistance / Altered mentation _18  - 0 Support Surface(s) Assessment (bed, cushion, seat, etc.) INTERVENTIONS - Wound Cleansing /  Measurement _0  - 0 Simple Wound Cleansing - one wound X- 3 5 Complex Wound Cleansing - multiple wounds X- 1 5 Wound Imaging (photographs - any number of wounds) _1  - 0 Wound Tracing (instead of photographs) _2  - 0 Simple Wound Measurement - one wound X- 3 5 Complex Wound Measurement - multiple wounds INTERVENTIONS - Wound Dressings X - Small Wound Dressing one or multiple wounds 1 10 _3  - 0 Medium Wound Dressing one or multiple wounds _4  - 0 Large Wound Dressing one or multiple wounds X- 1 5 Application of Medications - topical <[id number]>_5<\/[id number]>_5  - 0 Application of Medications - injection INTERVENTIONS - Miscellaneous _6  - 0 External ear exam _7  - 0 Specimen Collection (cultures, biopsies, blood, body fluids, etc.) _8  - 0 Specimen(s) / Culture(s) sent or taken to Lab for analysis _9  - 0 Patient Transfer (multiple staff / Civil Service fast streamer / Similar devices) _10  - 0 Simple Staple / Suture removal (25 or less) _11  - 0 Complex Staple / Suture removal (26 or more) _12  - 0 Hypo / Hyperglycemic Management (close monitor of Blood Glucose) _13  - 0 Ankle / Brachial Index (ABI) - do not check if billed separately X- 1 5 Vital Signs Has the patient been seen at the hospital within the last three years: Yes Total Score: 140 Level Of Care: New/Established - Level 4 Electronic Signature(s) Signed: 03/10/2020 5:26:06 PM By: Kela Millin Entered By: Kela Millin on 03/10/2020 08:25:25 -------------------------------------------------------------------------------- Encounter Discharge Information Details Patient Name: Date of Service: Casey O [name], [name] 03/10/2020 8:30 A M Medical Record Number: [id number] Patient Account Number: [id number] Date of Birth/Sex: Treating RN: 14-Dec-1940 (79 y.o.  [name] Primary Care Jess Sulak: [name] Other Clinician: Referring Hanae Waiters: Treating Sheyann Sulton/Extender: Gabriel Rainwater in Treatment: 71 Encounter Discharge Information Items Discharge Condition: Stable Ambulatory Status: Cane Discharge Destination: Home Transportation: Private Auto Accompanied By: self Schedule Follow-up Appointment: Yes Clinical Summary of Care: Patient Declined Electronic Signature(s) Signed: 03/10/2020 4:39:15 PM By: Baruch Gouty RN, BSN Entered By: Baruch Gouty on 03/10/2020 08:41:16 -------------------------------------------------------------------------------- Lower Extremity Assessment Details Patient Name: Date of Service: Casey O [name], [name] 03/10/2020 8:30 A M Medical Record Number: [id number] Patient Account Number: [id number] Date of Birth/Sex: Treating RN: [birth year] (79 y.o. [name]) [name] Primary Care Eh Sauseda: [name] Other Clinician: Referring Drey Shaff: Treating Exie Chrismer/Extender: Marlow Baars Weeks in Treatment: 33 Edema Assessment Assessed: [Left: No] [Right: No] Edema: [Left: Yes] [Right: Yes] Calf Left: Right: Point of Measurement: 32 cm From Medial Instep 30.5 cm 33 cm Ankle Left: Right: Point of Measurement: 10 cm From Medial Instep 22.5 cm 23.5 cm Electronic Signature(s) Signed: 03/10/2020 4:51:09 PM By: [name] RN Entered By: [name] on 03/10/2020 08:16:33 -------------------------------------------------------------------------------- Multi Wound Chart Details Patient Name: Date of Service: [name], [name] 03/10/2020 8:30 A M Medical Record Number: [id number] Patient Account Number: [id number] Date of Birth/Sex: Treating RN: 01-Aug-1940 (79 y.o. [name] Primary Care Pansie Guggisberg: [name] Other Clinician: Referring Yatzary Merriweather: Treating Kalvyn Desa/Extender: Gabriel Rainwater in Treatment: 33 Vital Signs Height(in): 73 Pulse(bpm): 70 Weight(lbs): 180 Blood Pressure(mmHg): 135/76 Body Mass Index(BMI): 24 Temperature(F): 98.4 Respiratory Rate(breaths/min): 18 Photos: [21:No Photos Left, Plantar Foot] [22:No Photos Right Metatarsal head first] [24:No Photos Left Calcaneus] Wound Location: [21:Gradually Appeared] [22:Gradually Appeared] [24:Gradually Appeared] Wounding Event: [21:Diabetic Wound/Ulcer of the Lower] [22:Diabetic Wound/Ulcer of the Lower] [24:Diabetic Wound/Ulcer of the Lower] Primary Etiology: [21:Extremity Cataracts, Glaucoma, Hypertension,] [22:Extremity Cataracts, Glaucoma, Hypertension,] [  24:Extremity Cataracts, Glaucoma, Hypertension,] Comorbid History: [21:Peripheral Arterial Disease, Type II Diabetes, Osteomyelitis, Neuropathy 06/17/2018] [22:Peripheral Arterial Disease, Type II Diabetes, Osteomyelitis, Neuropathy 06/17/2018] [24:Peripheral Arterial Disease, Type II Diabetes,  Osteomyelitis, Neuropathy 02/28/2020] Date Acquired: [21:33] [22:33] [24:1] Weeks of Treatment: [21:Open] [22:Open] [24:Open] Wound Status: [21:0.5x0.6x0.1] [22:1.5x0.8x0.1] [24:1x3x0.2] Measurements L x W x D (cm) [21:0.236] [22:0.942] [24:2.356] A (cm) : rea [21:0.024] [22:0.094] [24:0.471] Volume (cm) : [21:89.80%] [22:-50.00%] [24:-158.60%] % Reduction in A rea: [21:97.90%] [22:50.00%] [24:-72.50%] % Reduction in Volume: [21:Grade 2] [22:Grade 2] [24:Grade 2] Classification: [21:Medium] [22:Medium] [24:Medium] Exudate A mount: [21:Serosanguineous] [22:Serosanguineous] [24:Serosanguineous] Exudate Type: [21:red, brown] [22:red, brown] [24:red, brown] Exudate Color: [21:Yes] [22:Yes] [24:No] Foul Odor A Cleansing: [21:fter No] [22:No] [24:N/A] Odor A nticipated Due to Product Use: [21:Thickened] [22:Thickened] [24:N/A] Wound Margin: [21:Large (67-100%)] [22:Large (67-100%)] [24:Small (1-33%)] Granulation A mount: [21:Pink] [22:Red]  [24:Pink] Granulation Quality: [21:None Present (0%)] [22:None Present (0%)] [24:Large (67-100%)] Necrotic A mount: [21:Fat Layer (Subcutaneous Tissue): Yes Fat Layer (Subcutaneous Tissue): Yes Fat Layer (Subcutaneous Tissue): Yes] Exposed Structures: [21:Fascia: No Tendon: No Muscle: No Joint: No Bone: No None] [22:Fascia: No Tendon: No Muscle: No Joint: No Bone: No Small (1-33%)] [24:Fascia: No Tendon: No Muscle: No Joint: No Bone: No None] Treatment Notes Electronic Signature(s) Signed: 03/10/2020 5:26:06 PM By: Kela Millin Signed: 03/12/2020 6:30:39 AM By: Linton Ham MD Entered By: Linton Ham on 03/10/2020 08:29:09 -------------------------------------------------------------------------------- Multi-Disciplinary Care Plan Details Patient Name: Date of Service: [name], [name] 03/10/2020 8:30 A M Medical Record Number: [id number] Patient Account Number: [id number] Date of Birth/Sex: Treating RN: 11/26/1940 (79 y.o. [name] Primary Care Ceriah Kohler: [name] Other Clinician: Referring Kynlei Piontek: Treating Lorre Opdahl/Extender: Gabriel Rainwater in Treatment: 25 Active Inactive Wound/Skin Impairment Nursing Diagnoses: Impaired tissue integrity Knowledge deficit related to smoking impact on wound healing Knowledge deficit related to ulceration/compromised skin integrity Goals: Patient will demonstrate a reduced rate of smoking or cessation of smoking Date Initiated: 07/21/2019 Date Inactivated: 11/23/2019 Target Resolution Date: 11/12/2019 Goal Status: Met Patient/caregiver will verbalize understanding of skin care regimen Date Initiated: 07/21/2019 Target Resolution Date: 03/31/2020 Goal Status: Active Ulcer/skin breakdown will have a volume reduction of 30% by week 4 Date Initiated: 07/21/2019 Date Inactivated: 09/23/2019 Target Resolution Date: 09/17/2019 Unmet Reason: see wound Goal Status:  Unmet measurements. Interventions: Assess patient/caregiver ability to obtain necessary supplies Assess patient/caregiver ability to perform ulcer/skin care regimen upon admission and as needed Assess ulceration(s) every visit Provide education on smoking Provide education on ulcer and skin care Treatment Activities: Skin care regimen initiated : 07/21/2019 Topical wound management initiated : 07/21/2019 Notes: Electronic Signature(s) Signed: 03/10/2020 5:26:06 PM By: Kela Millin Entered By: Kela Millin on 03/10/2020 08:14:44 -------------------------------------------------------------------------------- Pain Assessment Details Patient Name: Date of Service: [name], [name] 03/10/2020 8:30 A M Medical Record Number: [id number] Patient Account Number: [id number] Date of Birth/Sex: Treating RN: 09/09/1940 (79 y.o. [name]) [name] Primary Care Cherrill Scrima: [name] Other Clinician: Referring Ademide Schaberg: Treating Jakson Delpilar/Extender: Marlow Baars Weeks in Treatment: 33 Active Problems Location of Pain Severity and Description of Pain Patient Has Paino No Site Locations Pain Management and Medication Current Pain Management: Electronic Signature(s) Signed: 03/10/2020 4:51:09 PM By: [name] RN Entered By: [name] on 03/10/2020 08:16:29 -------------------------------------------------------------------------------- Patient/Caregiver Education Details Patient Name: Date of Service: [name], [name] 9/24/2021andnbsp8:30 A M Medical Record Number: [id number] Patient Account Number: [id number] Date of Birth/Gender: Treating RN: 06-20-1940 (79 y.o. [name] Primary Care Physician: Fara Olden,  [name] Other Clinician: Referring Physician: Treating Physician/Extender: Gabriel Rainwater in Treatment: 42 Education Assessment Education Provided To: Patient Education Topics  Provided Smoking and Wound Healing: Methods: Explain/Verbal Responses: State content correctly Wound/Skin Impairment: Handouts: Caring for Your Ulcer Methods: Explain/Verbal Responses: State content correctly Electronic Signature(s) Signed: 03/10/2020 5:26:06 PM By: Kela Millin Entered By: Kela Millin on 03/10/2020 08:15:01 -------------------------------------------------------------------------------- Wound Assessment Details Patient Name: Date of Service: [name], [name] 03/10/2020 8:30 A M Medical Record Number: [id number] Patient Account Number: [id number] Date of Birth/Sex: Treating RN: 08-Nov-1940 (79 y.o. [name]) [name] Primary Care Wendelyn Kiesling: [name] Other Clinician: Referring Jawad Wiacek: Treating Vinicio Lynk/Extender: Marlow Baars Weeks in Treatment: 33 Wound Status Wound Number: 21 Primary Diabetic Wound/Ulcer of the Lower Extremity Etiology: Wound Location: Left, Plantar Foot Wound Open Wounding Event: Gradually Appeared Status: Date Acquired: 06/17/2018 Comorbid Cataracts, Glaucoma, Hypertension, Peripheral Arterial Disease, Weeks Of Treatment: 33 History: Type II Diabetes, Osteomyelitis, Neuropathy Clustered Wound: No Photos Photo Uploaded By: Mikeal Hawthorne on 03/10/2020 10:31:40 Wound Measurements Length: (cm) 0.5 % Red Width: (cm) 0.6 % Red Depth: (cm) 0.1 Epith Area: (cm) 0.236 Tunn Volume: (cm) 0.024 Unde uction in Area: 89.8% uction in Volume: 97.9% elialization: None eling: No rmining: No Wound Description Classification: Grade 2 Foul Wound Margin: Thickened Due t Exudate Amount: Medium Sloug Exudate Type: Serosanguineous Exudate Color: red, brown Odor After Cleansing: Yes o Product Use: No h/Fibrino No Wound Bed Granulation Amount: Large (67-100%) Exposed Structure Granulation Quality: Pink Fascia Exposed: No Necrotic Amount: None Present (0%) Fat Layer (Subcutaneous Tissue) Exposed:  Yes Tendon Exposed: No Muscle Exposed: No Joint Exposed: No Bone Exposed: No Treatment Notes Wound #21 (Left, Plantar Foot) 2. Periwound Care Moisturizing lotion 3. Primary Dressing Applied Calcium Alginate 4. Secondary Dressing Dry Gauze Roll Gauze Foam Heel Cup 7. Footwear/Offloading device applied Diabetic shoe Electronic Signature(s) Signed: 03/10/2020 4:51:09 PM By: [name] RN Signed: 03/10/2020 4:51:09 PM By: [name] RN Entered By: [name] on 03/10/2020 08:17:25 -------------------------------------------------------------------------------- Wound Assessment Details Patient Name: Date of Service: [name], [name] 03/10/2020 8:30 A M Medical Record Number: [id number] Patient Account Number: [id number] Date of Birth/Sex: Treating RN: Sep 28, 1940 (79 y.o. [name]) [name] Primary Care Willie Loy: [name] Other Clinician: Referring Francisco Ostrovsky: Treating Farrah Skoda/Extender: Gabriel Rainwater in Treatment: 33 Wound Status Wound Number: 22 Primary Diabetic Wound/Ulcer of the Lower Extremity Etiology: Wound Location: Right Metatarsal head first Wound Open Wounding Event: Gradually Appeared Status: Date Acquired: 06/17/2018 Comorbid Cataracts, Glaucoma, Hypertension, Peripheral Arterial Disease, Weeks Of Treatment: 33 History: Type II Diabetes, Osteomyelitis, Neuropathy Clustered Wound: No Photos Photo Uploaded By: Mikeal Hawthorne on 03/10/2020 10:31:40 Wound Measurements Length: (cm) 1.5 Width: (cm) 0.8 Depth: (cm) 0.1 Area: (cm) 0.942 Volume: (cm) 0.094 % Reduction in Area: -50% % Reduction in Volume: 50% Epithelialization: Small (1-33%) Tunneling: No Undermining: No Wound Description Classification: Grade 2 Wound Margin: Thickened Exudate Amount: Medium Exudate Type: Serosanguineous Exudate Color: red, brown Foul Odor After Cleansing: Yes Due to Product Use: No Slough/Fibrino No Wound Bed Granulation  Amount: Large (67-100%) Exposed Structure Granulation Quality: Red Fascia Exposed: No Necrotic Amount: None Present (0%) Fat Layer (Subcutaneous Tissue) Exposed: Yes Tendon Exposed: No Muscle Exposed: No Joint Exposed: No Bone Exposed: No Treatment Notes Wound #22 (Right Metatarsal head first) 2. Periwound Care Moisturizing lotion 3. Primary Dressing Applied Calcium Alginate 4. Secondary Dressing Dry Gauze Roll Gauze Foam Heel Cup 7. Footwear/Offloading device applied Diabetic shoe Electronic Signature(s) Signed:  03/10/2020 4:51:09 PM By: [name] RN Entered By: [name] on 03/10/2020 08:17:35 -------------------------------------------------------------------------------- Wound Assessment Details Patient Name: Date of Service: [name] [name], [name] 03/10/2020 8:30 A M Medical Record Number: [id number] Patient Account Number: [id number] Date of Birth/Sex: Treating RN: [birth year] (79 y.o. [name]) [name] Primary Care Ilina Xu: [name] Other Clinician: Referring Willson Lipa: Treating Shaune Malacara/Extender: Marlow Baars Weeks in Treatment: 33 Wound Status Wound Number: 24 Primary Diabetic Wound/Ulcer of the Lower Extremity Etiology: Wound Location: Left Calcaneus Wound Open Wounding Event: Gradually Appeared Status: Date Acquired: 02/28/2020 Comorbid Cataracts, Glaucoma, Hypertension, Peripheral Arterial Disease, Weeks Of Treatment: 1 History: Type II Diabetes, Osteomyelitis, Neuropathy Clustered Wound: No Photos Photo Uploaded By: Mikeal Hawthorne on 03/10/2020 10:34:03 Wound Measurements Length: (cm) 1 Width: (cm) 3 Depth: (cm) 0.2 Area: (cm) 2.356 Volume: (cm) 0.471 % Reduction in Area: -158.6% % Reduction in Volume: -72.5% Epithelialization: None Tunneling: No Undermining: No Wound Description Classification: Grade 2 Exudate Amount: Medium Exudate Type: Serosanguineous Exudate Color: red, brown Foul Odor After  Cleansing: No Slough/Fibrino Yes Wound Bed Granulation Amount: Small (1-33%) Exposed Structure Granulation Quality: Pink Fascia Exposed: No Necrotic Amount: Large (67-100%) Fat Layer (Subcutaneous Tissue) Exposed: Yes Necrotic Quality: Adherent Slough Tendon Exposed: No Muscle Exposed: No Joint Exposed: No Bone Exposed: No Treatment Notes Wound #24 (Left Calcaneus) 2. Periwound Care Moisturizing lotion 3. Primary Dressing Applied Calcium Alginate 4. Secondary Dressing Dry Gauze Roll Gauze Foam Heel Cup 7. Footwear/Offloading device applied Diabetic shoe Electronic Signature(s) Signed: 03/10/2020 4:51:09 PM By: [name] RN Entered By: [name] on 03/10/2020 08:17:43 -------------------------------------------------------------------------------- Vitals Details Patient Name: Date of Service: [name], [name] 03/10/2020 8:30 A M Medical Record Number: [id number] Patient Account Number: [id number] Date of Birth/Sex: Treating RN: [birth year] (79 y.o. [name]) [name] Primary Care Haydn Cush: [name] Other Clinician: Referring Evonna Stoltz: Treating Craven Crean/Extender: Gabriel Rainwater in Treatment: 33 Vital Signs Time Taken: 08:16 Temperature (F): 98.4 Height (in): 73 Pulse (bpm): 69 Weight (lbs): 180 Respiratory Rate (breaths/min): 18 Body Mass Index (BMI): 23.7 Blood Pressure (mmHg): 135/76 Reference Range: 80 - 120 mg / dl Electronic Signature(s) Signed: 03/10/2020 4:51:09 PM By: [name] RN Entered By: [name] on 03/10/2020 52:58:94

## 2020-03-12 NOTE — Progress Notes (Signed)
[name] ([id number]) Visit Report for 03/10/2020 HPI Details Patient Name: Date of Service: [name], [place] 03/10/2020 8:30 A M Medical Record Number: [id number] Patient Account Number: [id number] Date of Birth/Sex: Treating RN: 08/02/1940 (79 y.o. [name] Primary Care Provider: Leeroy [name] Other Clinician: Referring Provider: Treating Provider/Extender: [name] in Treatment: 30 History of Present Illness HPI Description: very pleasant 79 year old gentleman has type 2 diabetes and has been having an ulcer on his foot for several years. Been seeing as this time around since the end of October. Classified as a Wagner grade 2 because his previous x-rays did not show any problems. He has an MRI pending this Friday. discussing with him he says he has not been able to get his diabetic shoes yet. He continues to smoke his pipe and drinks moonshine and says he will not give this up. I have tried to counsel him regarding this but he firmly says that he is happy to listen to me but he is not going to give up his habits. 08/17/14 -- patient has no fresh complaints but has recently come with his MRI which was done on 08/12/2014. 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 Readmission: 07/21/2019 upon evaluation today patient presents for initial inspection here in our clinic concerning issues that he has been having with his bilateral feet. He has open wounds that been present at least since the beginning of the year although he does not know an exact time. He is previously had amputations of all toes and the distal portion of his foot. 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 With that being said he does have a history of diabetes, peripheral vascular disease, nicotine dependence, and hypertension. He has not had any  recent arterial or vascular studies he was noncompressible today I think he is going to require referral for arterial studies as well. 2/11; patient was readmitted to our clinic last week. He has bilateral plantar foot wounds in the setting of previous transmetatarsal amputations. He had his arterial studies done on 2/8; these were actually quite good. He was noncompressible on both sides however his waveforms were triphasic. The thought was noncompressible vessels consistent with medial calcification but without significant stenosis in the lower extremities. X-rays were done on the left this showed extensive chronic and postoperative changes without evidence of osteomyelitis. There was no evidence of osteomyelitis on the right foot there was mild vascular calcification MRI was suggested on the left based on clinical correlation. The patient arrives in clinic with some odor and drainage from the left foot. Right foot measures smaller. He is really not offloading these areas although he says he does not walk much 2/25; the patient arrives with some odor and drainage from the left foot again. Post debridement I cultured this area. He has the area on the midfoot roughly the third metatarsal head and the first metatarsal head on the right. He is in surgical shoes bilateral transmet 3/4; culture from last week showed abundant group B strep and a few Proteus. We will put him on cephalexin starting today. Still silver alginate to the wounds 3/11; due to issues with pharmacy he still does not have the cephalexin that I prescribed a week ago. Apparently this was called into a different pharmacy on Friday but they have not delivered it to him. We have been using silver alginate on  the wounds but because of lack of moisture change to silver collagen today. We are offloading as best we can in surgical shoes 3/18; he is taking the cephalexin that I prescribed 2 weeks ago. Using silver collagen. He has surgical  shoes. Setting of bilateral previous transmetatarsal amputations. Wounds are mirror-image wounds medially. 3/25; mirror-image wounds in the setting of previous transmetatarsal amputation. The wounds are mirror image medial foot wounds. We have been using silver collagen. We are using surgical shoes offloaded with felt. He came in today asking about his "$900" custom-made shoes although after talking to them it does not appear that he ever really wore these and they are more than a year old 4/8; mirror-image wounds on the medial part of the plantar feet in the setting of previous transmetatarsal amputations. We have been using silver collagen. I aggressively debrided these 2 weeks ago removing callus subcutaneous tissue from around the wound margins. He did indeed bring echo he has custom-made shoes with transmetatarsal inserts. He has never worn these. He claims to be not spending a lot of time on his feet 4/15; mirror-image wounds on the plantar feet in the setting of previous transmetatarsal amputation. We have been using silver alginate. Culture that I did last time grew MRSA and Proteus. I have him on doxycycline. This was a deep tissue culture. The Proteus was not actively plated against doxycycline but was otherwise pansensitive I think the doxycycline would cover this. His wounds actually looks somewhat better. Rolled senescent edges still around the wound edges 4/29; mirror-image wounds on the plantar feet in the setting of previous bilateral transmetatarsal amputations we have been using silver alginate. Last time I saw him 2 weeks ago I gave him a course of doxycycline. Things seem to have cleaned up. This is in response to a deep tissue culture that showed MRSA and Proteus 5/6; wounds on the bilateral plantar feet in the setting of previous transmetatarsal amputations. We have been using silver alginate. Arrives in the clinic today with the same raised thick callus and subcutaneous tissue.  He has home health changing the dressing. He brought up the fact that he has transportation through [facility] apparently he only has "12 visits". Otherwise he has the pay $40 for a cab ride there and back from his home which I guess is [place] [address]. 11/11/19-Patient back with left transmet plantar surface much worse with extension of devitalized tissue proximally from the original wound with dense callus rim, the right is about the same, apparently patient was wearing tennis shoes at home per home health, although patient swears that he has been using his offloading devices that he has with inserts. 6/8; I have not seen this patient in over a month however he was seen last week in my absence. He has mirror-image wounds over his bilateral transmit plantar surfaces. He has been using silver alginate. He comes in today with literally a mountain of callus around the 2 wounds on the left. The area on the right somewhat better but still callus and nonviable tissue around the circular wound with some depth. He had an x-ray done last week of the left foot that was negative a culture showed multiple organisms. He did not get any antibiotics. He has custom-made shoes and apparently is getting another pair from [employer] that is in the process. I told him that we are not offloading the sufficiently for what ever reason. I do not think he is wearing his custom shoes at home  although he says he is not walking that much 6/17 bilateral plantar foot in the setting of previous transmetatarsal amputations. He has been using silver alginate. He has his own custom made shoes which he is supposed to have it replaced in the next week or 2. We have been extensively debriding the callus around both wound sites and things look slightly better today 6/24; patient arrives in clinic today with her intake nurse pointing out odor and drainage. We have been using silver alginate. I was going to put a total contact cast  on this leg today but in view of the odor and drainage will postpone that 7/1; intake nurse still points out odor and drainage. Last week I did a deep tissue culture of the right foot this grew a few methicillin resistant staph aureus, few group B strep and a few Pseudomonas. No anaerobes were isolated. He will require a course of 2 different antibiotics probably ciprofloxacin and doxycycline. As usual he has thick callus around the wound 7/15; copious amounts of callus. X-rays I did last time showed chronic changes versus possible osteomyelitis at the plantar aspect of the proximal metatarsal. We will therefore go ahead with the MRI. 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 We have been using silver alginate. 7/22; patient's MRI is not until 01/19/2020. Still copious amounts of thick callus subcutaneous tissue from around the wound margins. We have been using silver alginate but apparently home health is not actually putting these in the wounds especially the right with the undermining area laterally 8/26; patient has not been here in over a month. He did not get his MRI done and he is not rebooked it. I am not exactly sure what he has been doing to these wounds on his bilateral feet. He comes in today with odor, thick callus around both wound areas. He is not really offloading this to any major degree. We have been using silver alginate 9/17; 3-week follow-up. The reason for his noncompliance with clinic visits is not really clear. He has never gotten his MRI done and to be honest I basically given up that he will participate in this. He is using silver alginate, he has home health changing this 3 times a week. He has diabetic boots but obviously he is not able to offload this properly. He arrives in with copious amounts of callus around the wounds. In addition he has a new wound on the left heel 9/24; his MRI is booked for  next Tuesday on the right foot. He has an area over the right first metatarsal head left plantar foot and left plantar heel. He is using silver alginate Electronic Signature(s) Signed: 03/12/2020 6:30:39 AM By: Linton Ham MD Entered By: Linton Ham on 03/10/2020 08:33:50 -------------------------------------------------------------------------------- Physical Exam Details Patient Name: Date of Service: [name], [name] 03/10/2020 8:30 A M Medical Record Number: [id number] Patient Account Number: [id number] Date of Birth/Sex: Treating RN: 1940-10-08 (79 y.o. [name] Primary Care Provider: Leeroy [name] Other Clinician: Referring Provider: Treating Provider/Extender: [name] in Treatment: 33 Cardiovascular Dorsalis pedis pulses palpable. Notes Wound exam; I did copious debridements last week on all the wound areas especially the callus and the nonviable subcutaneous tissue so he arrives with things looking a little better. The open area over the right first metatarsal head has a viable surface. Better looking tissue at the circumference Similarly the left heel looks like  it is come down quite a bit Left plantar foot also looks Electronic Signature(s) Signed: 03/12/2020 6:30:39 AM By: Linton Ham MD Entered By: Linton Ham on 03/10/2020 08:35:08 -------------------------------------------------------------------------------- Physician Orders Details Patient Name: Date of Service: [name], [name] 03/10/2020 8:30 A M Medical Record Number: [id number] Patient Account Number: [id number] Date of Birth/Sex: Treating RN: [birth year] (79 y.o. [name] Primary Care Provider: Leeroy [name] Other Clinician: Referring Provider: Treating Provider/Extender: [name] in Treatment: 59 Verbal / Phone Orders: No Diagnosis Coding ICD-10 Coding Code Description E11.621  Type 2 diabetes mellitus with foot ulcer L97.522 Non-pressure chronic ulcer of other part of left foot with fat layer exposed L97.512 Non-pressure chronic ulcer of other part of right foot with fat layer exposed I73.89 Other specified peripheral vascular diseases L97.422 Non-pressure chronic ulcer of left heel and midfoot with fat layer exposed Follow-up Appointments Return Appointment in 1 week. Dressing Change Frequency Wound #21 Left,Plantar Foot Change dressing three times week. Wound #22 Right Metatarsal head first Change dressing three times week. Wound #24 Left Calcaneus Change dressing three times week. Wound Cleansing Clean wound with Wound Cleanser - or normal saline Primary Wound Dressing Wound #21 Left,Plantar Foot lginate - NO SILVER DUE TO MRI TUESDAY Calcium A Wound #22 Right Metatarsal head first lginate - NO SILVER DUE TO MRI TUESDAY Calcium A Wound #24 Left Calcaneus lginate - NO SILVER DUE TO MRI TUESDAY Calcium A Secondary Dressing Wound #21 Left,Plantar Foot Foam - foam donut to offload Kerlix/Rolled Gauze Dry Gauze Wound #22 Right Metatarsal head first Foam - foam donut to offload Kerlix/Rolled Gauze Dry Gauze Wound #24 Left Calcaneus Kerlix/Rolled Gauze Dry Gauze Heel Cup Edema Control Avoid standing for long periods of time Elevate legs to the level of the heart or above for 30 minutes daily and/or when sitting, a frequency of: - throughout the day. Off-Loading Other: - felt patient's personal shoes. patient to wear diabetic shoes. patient to limit about of time spent walking and standing. [facility] skilled nursing for wound care. - [employer], maintenance) Signed: 03/10/2020 5:26:06 PM By: Kela Millin Signed: 03/12/2020 6:30:39 AM By: Linton Ham MD Entered By: Kela Millin on 03/10/2020 08:24:25 -------------------------------------------------------------------------------- Problem List  Details Patient Name: Date of Service: [name], [name] 03/10/2020 8:30 A M Medical Record Number: [id number] Patient Account Number: [id number] Date of Birth/Sex: Treating RN: Sep 26, 1940 (79 y.o. [name] Primary Care Provider: Leeroy [name] Other Clinician: Referring Provider: Treating Provider/Extender: [name] in Treatment: 23 Active Problems ICD-10 Encounter Code Description Active Date MDM Diagnosis E11.621 Type 2 diabetes mellitus with foot ulcer 07/21/2019 No Yes L97.522 Non-pressure chronic ulcer of other part of left foot with fat layer exposed 07/21/2019 No Yes L97.512 Non-pressure chronic ulcer of other part of right foot with fat layer exposed 07/21/2019 No Yes I73.89 Other specified peripheral vascular diseases 07/21/2019 No Yes L97.422 Non-pressure chronic ulcer of left heel and midfoot with fat layer exposed 03/03/2020 No Yes Inactive Problems ICD-10 Code Description Active Date Inactive Date F17.218 Nicotine dependence, cigarettes, with other nicotine-induced disorders 07/21/2019 07/21/2019 L03.115 Cellulitis of right lower limb 12/16/2019 12/16/2019 I10 Essential (primary) hypertension 07/21/2019 07/21/2019 L84 Corns and callosities 07/21/2019 07/21/2019 Resolved Problems Electronic Signature(s) Signed: 03/12/2020 6:30:39 AM By: Linton Ham MD Entered By: Linton Ham on 03/10/2020 08:28:58 -------------------------------------------------------------------------------- Progress Note Details Patient Name: Date of Service: [name], [name] 03/10/2020 8:30 A M Medical Record  Number: [id number] Patient Account Number: [id number] Date of Birth/Sex: Treating RN: December 04, 1940 (79 y.o. [name] Primary Care Provider: Leeroy [name] Other Clinician: Referring Provider: Treating Provider/Extender: [name] in Treatment: 33 Subjective History of Present Illness  (HPI) very pleasant 79 year old gentleman has type 2 diabetes and has been having an ulcer on his foot for several years. Been seeing as this time around since the end of October. Classified as a Wagner grade 2 because his previous x-rays did not show any problems. He has an MRI pending this Friday. discussing with him he says he has not been able to get his diabetic shoes yet. He continues to smoke his pipe and drinks moonshine and says he will not give this up. I have tried to counsel him regarding this but he firmly says that he is happy to listen to me but he is not going to give up his habits. 08/17/14 -- patient has no fresh complaints but has recently come with his MRI which was done on 08/12/2014. 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 Readmission: 07/21/2019 upon evaluation today patient presents for initial inspection here in our clinic concerning issues that he has been having with his bilateral feet. He has open wounds that been present at least since the beginning of the year although he does not know an exact time. He is previously had amputations of all toes and the distal portion of his foot. 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 With that being said he does have a history of diabetes, peripheral vascular disease, nicotine dependence, and hypertension. He has not had any recent arterial or vascular studies he was noncompressible today I think he is going to require referral for arterial studies as well. 2/11; patient was readmitted to our clinic last week. He has bilateral plantar foot wounds in the setting of previous transmetatarsal amputations. He had his arterial studies done on 2/8; these were actually quite good. He was noncompressible on both sides however his waveforms were triphasic. The thought was noncompressible vessels consistent with medial  calcification but without significant stenosis in the lower extremities. X-rays were done on the left this showed extensive chronic and postoperative changes without evidence of osteomyelitis. There was no evidence of osteomyelitis on the right foot there was mild vascular calcification MRI was suggested on the left based on clinical correlation. The patient arrives in clinic with some odor and drainage from the left foot. Right foot measures smaller. He is really not offloading these areas although he says he does not walk much 2/25; the patient arrives with some odor and drainage from the left foot again. Post debridement I cultured this area. He has the area on the midfoot roughly the third metatarsal head and the first metatarsal head on the right. He is in surgical shoes bilateral transmet 3/4; culture from last week showed abundant group B strep and a few Proteus. We will put him on cephalexin starting today. Still silver alginate to the wounds 3/11; due to issues with pharmacy he still does not have the cephalexin that I prescribed a week ago. Apparently this was called into a different pharmacy on Friday but they have not delivered it to him. We have been using silver alginate on the wounds but because of lack of moisture change to silver collagen today. We are offloading as best we can in surgical shoes 3/18; he  is taking the cephalexin that I prescribed 2 weeks ago. Using silver collagen. He has surgical shoes. Setting of bilateral previous transmetatarsal amputations. Wounds are mirror-image wounds medially. 3/25; mirror-image wounds in the setting of previous transmetatarsal amputation. The wounds are mirror image medial foot wounds. We have been using silver collagen. We are using surgical shoes offloaded with felt. He came in today asking about his "$900" custom-made shoes although after talking to them it does not appear that he ever really wore these and they are more than a year  old 4/8; mirror-image wounds on the medial part of the plantar feet in the setting of previous transmetatarsal amputations. We have been using silver collagen. I aggressively debrided these 2 weeks ago removing callus subcutaneous tissue from around the wound margins. He did indeed bring echo he has custom-made shoes with transmetatarsal inserts. He has never worn these. He claims to be not spending a lot of time on his feet 4/15; mirror-image wounds on the plantar feet in the setting of previous transmetatarsal amputation. We have been using silver alginate. Culture that I did last time grew MRSA and Proteus. I have him on doxycycline. This was a deep tissue culture. The Proteus was not actively plated against doxycycline but was otherwise pansensitive I think the doxycycline would cover this. His wounds actually looks somewhat better. Rolled senescent edges still around the wound edges 4/29; mirror-image wounds on the plantar feet in the setting of previous bilateral transmetatarsal amputations we have been using silver alginate. Last time I saw him 2 weeks ago I gave him a course of doxycycline. Things seem to have cleaned up. This is in response to a deep tissue culture that showed MRSA and Proteus 5/6; wounds on the bilateral plantar feet in the setting of previous transmetatarsal amputations. We have been using silver alginate. Arrives in the clinic today with the same raised thick callus and subcutaneous tissue. He has home health changing the dressing. He brought up the fact that he has transportation through [facility] apparently he only has "12 visits". Otherwise he has the pay $40 for a cab ride there and back from his home which I guess is [place] [address]. 11/11/19-Patient back with left transmet plantar surface much worse with extension of devitalized tissue proximally from the original wound with dense callus rim, the right is about the same, apparently patient was wearing  tennis shoes at home per home health, although patient swears that he has been using his offloading devices that he has with inserts. 6/8; I have not seen this patient in over a month however he was seen last week in my absence. He has mirror-image wounds over his bilateral transmit plantar surfaces. He has been using silver alginate. He comes in today with literally a mountain of callus around the 2 wounds on the left. The area on the right somewhat better but still callus and nonviable tissue around the circular wound with some depth. He had an x-ray done last week of the left foot that was negative a culture showed multiple organisms. He did not get any antibiotics. He has custom-made shoes and apparently is getting another pair from [employer] that is in the process. I told him that we are not offloading the sufficiently for what ever reason. I do not think he is wearing his custom shoes at home although he says he is not walking that much 6/17 bilateral plantar foot in the setting of previous transmetatarsal amputations. He has been using silver  alginate. He has his own custom made shoes which he is supposed to have it replaced in the next week or 2. We have been extensively debriding the callus around both wound sites and things look slightly better today 6/24; patient arrives in clinic today with her intake nurse pointing out odor and drainage. We have been using silver alginate. I was going to put a total contact cast on this leg today but in view of the odor and drainage will postpone that 7/1; intake nurse still points out odor and drainage. Last week I did a deep tissue culture of the right foot this grew a few methicillin resistant staph aureus, few group B strep and a few Pseudomonas. No anaerobes were isolated. He will require a course of 2 different antibiotics probably ciprofloxacin and doxycycline. As usual he has thick callus around the wound 7/15; copious amounts of callus. X-rays I  did last time showed chronic changes versus possible osteomyelitis at the plantar aspect of the proximal metatarsal. We will therefore go ahead with the MRI. 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 We have been using silver alginate. 7/22; patient's MRI is not until 01/19/2020. Still copious amounts of thick callus subcutaneous tissue from around the wound margins. We have been using silver alginate but apparently home health is not actually putting these in the wounds especially the right with the undermining area laterally 8/26; patient has not been here in over a month. He did not get his MRI done and he is not rebooked it. I am not exactly sure what he has been doing to these wounds on his bilateral feet. He comes in today with odor, thick callus around both wound areas. He is not really offloading this to any major degree. We have been using silver alginate 9/17; 3-week follow-up. The reason for his noncompliance with clinic visits is not really clear. He has never gotten his MRI done and to be honest I basically given up that he will participate in this. He is using silver alginate, he has home health changing this 3 times a week. He has diabetic boots but obviously he is not able to offload this properly. He arrives in with copious amounts of callus around the wounds. In addition he has a new wound on the left heel 9/24; his MRI is booked for next Tuesday on the right foot. He has an area over the right first metatarsal head left plantar foot and left plantar heel. He is using silver alginate Objective Constitutional Vitals Time Taken: 8:16 AM, Height: 73 in, Weight: 180 lbs, BMI: 23.7, Temperature: 98.4 F, Pulse: 69 bpm, Respiratory Rate: 18 breaths/min, Blood Pressure: 135/76 mmHg. Cardiovascular Dorsalis pedis pulses palpable. General Notes: Wound exam; ooI did copious debridements last week on all the wound  areas especially the callus and the nonviable subcutaneous tissue so he arrives with things looking a little better. ooThe open area over the right first metatarsal head has a viable surface. Better looking tissue at the circumference ooSimilarly the left heel looks like it is come down quite a bit ooLeft plantar foot also looks Integumentary (Hair, Skin) Wound #21 status is Open. Original cause of wound was Gradually Appeared. The wound is located on the [place]. The wound measures 0.5cm length x 0.6cm width x 0.1cm depth; 0.236cm^2 area and 0.024cm^3 volume. There is Fat Layer (Subcutaneous Tissue) exposed. There is no tunneling or undermining noted. There is a medium  amount of serosanguineous drainage noted. Foul odor after cleansing was noted. The wound margin is thickened. There is large (67- 100%) pink granulation within the wound bed. There is no necrotic tissue within the wound bed. Wound #22 status is Open. Original cause of wound was Gradually Appeared. The wound is located on the Right Metatarsal head first. The wound measures 1.5cm length x 0.8cm width x 0.1cm depth; 0.942cm^2 area and 0.094cm^3 volume. There is Fat Layer (Subcutaneous Tissue) exposed. There is no tunneling or undermining noted. There is a medium amount of serosanguineous drainage noted. Foul odor after cleansing was noted. The wound margin is thickened. There is large (67-100%) red granulation within the wound bed. There is no necrotic tissue within the wound bed. Wound #24 status is Open. Original cause of wound was Gradually Appeared. The wound is located on the Left Calcaneus. The wound measures 1cm length x 3cm width x 0.2cm depth; 2.356cm^2 area and 0.471cm^3 volume. There is Fat Layer (Subcutaneous Tissue) exposed. There is no tunneling or undermining noted. There is a medium amount of serosanguineous drainage noted. There is small (1-33%) pink granulation within the wound bed. There is a large  (67-100%) amount of necrotic tissue within the wound bed including Adherent Slough. 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 Dressing Change Frequency: Wound #21 Left,Plantar Foot: Change dressing three times week. Wound #22 Right Metatarsal head first: Change dressing three times week. Wound #24 Left Calcaneus: Change dressing three times week. Wound Cleansing: Clean wound with Wound Cleanser - or normal saline Primary Wound Dressing: Wound #21 Left,Plantar Foot: Calcium Alginate - NO SILVER DUE TO MRI TUESDAY Wound #22 Right Metatarsal head first: Calcium Alginate - NO SILVER DUE TO MRI TUESDAY Wound #24 Left Calcaneus: Calcium Alginate - NO SILVER DUE TO MRI TUESDAY Secondary Dressing: Wound #21 Left,Plantar Foot: Foam - foam donut to offload Kerlix/Rolled Gauze Dry Gauze Wound #22 Right Metatarsal head first: Foam - foam donut to offload Kerlix/Rolled Gauze Dry Gauze Wound #24 Left Calcaneus: Kerlix/Rolled Gauze Dry Gauze Heel Cup Edema Control: Avoid standing for long periods of time Elevate legs to the level of the heart or above for 30 minutes daily and/or when sitting, a frequency of: - throughout the day. Off-Loading: Other: - felt patient's personal shoes. patient to wear diabetic shoes. patient to limit about of time spent walking and standing. Home Health: [facility] for wound care. - [facility]. I am going to use calcium alginate instead of silver alginate because of the MRI on Tuesday assuming he keeps the appointment 2. Foam/kerlix and rolled gauze 3. His wounds  look better this week. I have been considering a total contact cast however I wonder if this is going to be necessary 4. 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 Gay Filler, Larene [place]  Primary Care Provider: Leeroy [name] Other Clinician: Referring Provider: Treating Provider/Extender: [name] in Treatment: 33 Diagnosis Coding ICD-10 Codes Code Description E11.621 Type 2 diabetes mellitus with foot ulcer L97.522 Non-pressure chronic ulcer of other part of left foot with fat layer exposed L97.512 Non-pressure chronic ulcer of other part of right foot with fat layer exposed I73.89 Other specified peripheral vascular diseases L97.422 Non-pressure chronic ulcer of left heel and midfoot with fat layer exposed Facility Procedures The patient participates with Medicare or their insurance follows the Medicare Facility Guidelines: CPT4 Code Description Modifier Quantity [id number] [facility] VISIT-LEV 4 EST PT 1 Physician Procedures : CPT4 Code Description Modifier [id number] - WC PHYS LEVEL 3 - EST PT ICD-10 Diagnosis Description L97.522 Non-pressure chronic ulcer of other part of left foot with fat layer exposed L97.512 Non-pressure chronic ulcer of other part of right foot  with fat layer exposed Quantity: 1 Electronic Signature(s) Signed: 03/12/2020 6:30:39 AM By: Linton Ham MD Entered By: Linton Ham on 03/10/2020 08:36:58

## 2020-03-14 ENCOUNTER — Ambulatory Visit (HOSPITAL_COMMUNITY)
Admission: RE | Admit: 2020-03-14 | Discharge: 2020-03-14 | Disposition: A | Discharge status: Home / Self Care | Payer: Medicare Other | Source: Ambulatory Visit | Attending: Internal Medicine | Admitting: Internal Medicine

## 2020-03-14 ENCOUNTER — Other Ambulatory Visit: Payer: Self-pay

## 2020-03-14 DIAGNOSIS — L97512 Non-pressure chronic ulcer of other part of right foot with fat layer exposed: Secondary | ICD-10-CM

## 2020-03-14 MED ORDER — GADOBUTROL 1 MMOL/ML IV SOLN
8.0000 mL | Freq: Once | INTRAVENOUS | Status: AC | PRN
  Administered 2020-03-14: 8 mL via INTRAVENOUS

## 2020-03-15 ENCOUNTER — Telehealth: Payer: Self-pay

## 2020-03-15 NOTE — Telephone Encounter (Signed)
Patient called in for refill on tramadol

## 2020-03-16 ENCOUNTER — Telehealth: Payer: Self-pay | Admitting: Orthopedic Surgery

## 2020-03-16 NOTE — Telephone Encounter (Signed)
Called patient left message to return call to schedule an appointment for Rx refill. Patient have not been seen in the office since January.

## 2020-03-16 NOTE — Telephone Encounter (Signed)
Disregard patients last message there has been a mix up in doctors.

## 2020-03-16 NOTE — Telephone Encounter (Signed)
Can you please call pt for an appt? Not seen since january.

## 2020-03-17 ENCOUNTER — Encounter (HOSPITAL_BASED_OUTPATIENT_CLINIC_OR_DEPARTMENT_OTHER): Payer: Medicare Other | Attending: Internal Medicine | Admitting: Internal Medicine

## 2020-03-17 ENCOUNTER — Other Ambulatory Visit (HOSPITAL_COMMUNITY)
Admission: RE | Admit: 2020-03-17 | Discharge: 2020-03-17 | Disposition: A | Discharge status: Home / Self Care | Payer: Medicare Other | Source: Ambulatory Visit | Attending: Otolaryngology | Admitting: Otolaryngology

## 2020-03-17 ENCOUNTER — Other Ambulatory Visit: Payer: Self-pay

## 2020-03-17 ENCOUNTER — Other Ambulatory Visit (HOSPITAL_COMMUNITY)
Admission: RE | Admit: 2020-03-17 | Discharge: 2020-03-17 | Disposition: A | Discharge status: Home / Self Care | Payer: Medicare Other | Attending: Internal Medicine | Admitting: Internal Medicine

## 2020-03-17 DIAGNOSIS — E11621 Type 2 diabetes mellitus with foot ulcer: Secondary | ICD-10-CM | POA: Insufficient documentation

## 2020-03-17 DIAGNOSIS — L97422 Non-pressure chronic ulcer of left heel and midfoot with fat layer exposed: Secondary | ICD-10-CM | POA: Insufficient documentation

## 2020-03-17 DIAGNOSIS — A419 Sepsis, unspecified organism: Secondary | ICD-10-CM | POA: Diagnosis not present

## 2020-03-17 LAB — CBC WITH DIFFERENTIAL/PLATELET
Abs Immature Granulocytes: 0.02 10*3/uL (ref 0.00–0.07)
Basophils Absolute: 0.1 10*3/uL (ref 0.0–0.1)
Basophils Relative: 1 %
Eosinophils Absolute: 1 10*3/uL — ABNORMAL HIGH (ref 0.0–0.5)
Eosinophils Relative: 16 %
HCT: 39.1 % (ref 39.0–52.0)
Hemoglobin: 12.4 g/dL — ABNORMAL LOW (ref 13.0–17.0)
Immature Granulocytes: 0 %
Lymphocytes Relative: 25 %
Lymphs Abs: 1.5 10*3/uL (ref 0.7–4.0)
MCH: 28.8 pg (ref 26.0–34.0)
MCHC: 31.7 g/dL (ref 30.0–36.0)
MCV: 90.9 fL (ref 80.0–100.0)
Monocytes Absolute: 0.5 10*3/uL (ref 0.1–1.0)
Monocytes Relative: 9 %
Neutro Abs: 3 10*3/uL (ref 1.7–7.7)
Neutrophils Relative %: 49 %
Platelets: 315 10*3/uL (ref 150–400)
RBC: 4.3 MIL/uL (ref 4.22–5.81)
RDW: 16.6 % — ABNORMAL HIGH (ref 11.5–15.5)
WBC: 6 10*3/uL (ref 4.0–10.5)
nRBC: 0 % (ref 0.0–0.2)

## 2020-03-17 LAB — SEDIMENTATION RATE: Sed Rate: 50 mm/hr — ABNORMAL HIGH (ref 0–16)

## 2020-03-17 LAB — C-REACTIVE PROTEIN: CRP: 0.8 mg/dL (ref <1.0)

## 2020-03-17 NOTE — Progress Notes (Signed)
Casey, [name] ([id number]) Visit Report for 03/17/2020 Arrival Information Details Patient Name: Date of Service: [name], [place] [name] 03/17/2020 8:30 A Casey Medical Record Number: [id number] Patient Account Number: [id number] Date of Birth/Sex: Treating RN: 1940/07/07 (79 y.o. [name]) [name] Primary Care : [name] Other Clinician: Referring : Treating /Extender: [name] in [name]: 55 Visit Information History Since Last Visit All ordered tests and consults were completed: No Patient Arrived: [name] Added or deleted any medications: No Arrival Time: 08:40 Any new allergies or adverse reactions: No Accompanied By: self Had a fall or experienced change in No Transfer Assistance: None activities of daily living that may affect Patient Identification Verified: Yes risk of falls: Secondary Verification Process Completed: Yes Signs or symptoms of abuse/neglect since last visito No Patient Requires Transmission-Based Precautions: No Hospitalized since last visit: No Patient Has Alerts: Yes Implantable device outside of the clinic excluding No Patient Alerts: R ABI non compressible cellular tissue based products placed in the center L ABI non compressible since last visit: Has Dressing in Place as Prescribed: Yes Pain Present Now: No Electronic Signature(s) Signed: 03/17/2020 4:20:16 PM By: Casey Coria RN Entered By: [name] on 03/17/2020 08:41:33 -------------------------------------------------------------------------------- Lower Extremity Assessment Details Patient Name: Date of Service: [name] 03/17/2020 8:30 A Casey Medical Record Number: [id number] Patient Account Number: [id number] Date of Birth/Sex: Treating RN: Jan 05, 1941 (79 y.o. [name]) [name] Primary Care : [name] Other Clinician: Referring : Treating /Extender: [name]: 34 Edema Assessment Assessed: [name]: No] [Right: No] Edema: [Left: Yes] [Right: Yes] Calf Left: Right: Point of Measurement: 32 cm From Medial Instep 30 cm 33 cm Ankle Left: Right: Point of Measurement: 10 cm From Medial Instep 22 cm 23 cm Electronic Signature(s) Signed: 03/17/2020 4:20:16 PM By: Casey Coria RN Entered By: [name] on 03/17/2020 08:42:31 -------------------------------------------------------------------------------- Multi Wound Chart Details Patient Name: Date of Service: [name] 03/17/2020 8:30 A Casey Medical Record Number: [id number] Patient Account Number: [id number] Date of Birth/Sex: Treating RN: [birth year] (79 y.o. Marvis [name] Primary Care : [name] Other Clinician: Referring : Treating /Extender: [name] in [name]: 34 Vital Signs Height(in): 73 Pulse(bpm): 55 Weight(lbs): 180 Blood Pressure(mmHg): 108/60 Body Mass Index(BMI): 24 Temperature(F): 98.4 Respiratory Rate(breaths/min): 18 Photos: [21:No Photos Left, Plantar Foot] [22:No Photos Right Metatarsal head first] [24:No Photos Left Calcaneus] Wound Location: [21:Gradually Appeared] [22:Gradually Appeared] [24:Gradually Appeared] Wounding Event: [21:Diabetic Wound/Ulcer of the Lower] [22:Diabetic Wound/Ulcer of the Lower] [24:Diabetic Wound/Ulcer of the Lower] Primary Etiology: [21:Extremity Cataracts, Glaucoma, Hypertension,] [22:Extremity Cataracts, Glaucoma, Hypertension,] [24:Extremity Cataracts, Glaucoma, Hypertension,] Comorbid History: [21:Peripheral Arterial Disease, Type II Diabetes, Osteomyelitis, Neuropathy 06/17/2018] [22:Peripheral Arterial Disease, Type II Diabetes, Osteomyelitis, Neuropathy 06/17/2018] [24:Peripheral Arterial Disease, Type II Diabetes,  Osteomyelitis, Neuropathy 02/28/2020] Date Acquired: [21:34] [22:34] [24:2] Weeks of [name]: [21:Open]  [22:Open] [24:Open] Wound Status: [21:0.2x0.3x0.1] [22:0.5x0.4x0.1] [24:2x2x0.1] Measurements L x W x D (cm) [21:0.047] [22:0.157] [24:3.142] A (cm) : rea [21:0.005] [22:0.016] [24:0.314] Volume (cm) : [21:98.00%] [22:75.00%] [24:-244.90%] % Reduction in A rea: [21:99.60%] [22:91.50%] [24:-15.00%] % Reduction in Volume: [21:Grade 2] [22:Grade 2] [24:Grade 2] Classification: [21:Medium] [22:Medium] [24:Medium] Exudate A mount: [21:Serosanguineous] [22:Serosanguineous] [24:Serosanguineous] Exudate Type: [21:red, brown] [22:red, brown] [24:red, brown] Exudate Color: [21:Yes] [22:Yes] [24:No] Foul Odor A Cleansing: [21:fter No] [22:No] [24:N/A] Odor A nticipated Due to Product Use: [21:Thickened] [22:Thickened] [24:Distinct, outline attached] Wound Margin: [21:Large (67-100%)] [22:Large (67-100%)] [24:Large (67-100%)] Granulation  A mount: [21:Pink] [22:Red] [24:Red, Pink] Granulation Quality: [21:None Present (0%)] [22:None Present (0%)] [24:None Present (0%)] Necrotic A mount: [21:Fat Layer (Subcutaneous Tissue): Yes Fat Layer (Subcutaneous Tissue): Yes Fat Layer (Subcutaneous Tissue): Yes] Exposed Structures: [21:Fascia: No Tendon: No Muscle: No Joint: No Bone: No None] [22:Fascia: No Tendon: No Muscle: No Joint: No Bone: No Small (1-33%)] [24:Fascia: No Tendon: No Muscle: No Joint: No Bone: No Large (67-100%)] Epithelialization: [21:N/A] [22:N/A] [24:Debridement - Excisional] Debridement: Pre-procedure Verification/Time Out N/A [22:N/A] [24:08:50] Taken: [21:N/A] [22:N/A] [24:Other] Pain Control: [21:N/A] [22:N/A] [24:Callus, Subcutaneous] Tissue Debrided: [21:N/A] [22:N/A] [24:Skin/Subcutaneous Tissue] Level: [21:N/A] [22:N/A] [24:4] Debridement A (sq cm): [21:rea N/A] [22:N/A] [24:Curette] Instrument: [21:N/A] [22:N/A] [24:Swab] Specimen: [21:N/A] [22:N/A] [24:1] Number of Specimens Taken: [21:N/A] [22:N/A] [24:Minimum] Bleeding: [21:N/A] [22:N/A] [24:Pressure] Hemostasis A  chieved: [21:N/A] [22:N/A] [24:0] Procedural Pain: [21:N/A] [22:N/A] [24:0] Post Procedural Pain: [21:N/A] [22:N/A] [24:Procedure was tolerated well] Debridement [name] Response: [21:N/A] [22:N/A] [24:2x2x0.1] Post Debridement Measurements L x W x D (cm) [21:N/A] [22:N/A] [24:0.314] Post Debridement Volume: (cm) [21:N/A] [22:N/A] [24:Debridement] Procedures Performed: [name] Notes Electronic Signature(s) Signed: 03/17/2020 4:10:39 PM By: Kela Millin Signed: 03/17/2020 4:24:19 PM By: Linton Ham MD Entered By: Linton Ham on 03/17/2020 08:58:28 -------------------------------------------------------------------------------- Multi-Disciplinary Care Plan Details Patient Name: Date of Service: [name] 03/17/2020 8:30 A Casey Medical Record Number: [id number] Patient Account Number: [id number] Date of Birth/Sex: Treating RN: 01/31/1941 (79 y.o. Marvis [name] Primary Care Gillian Kluever: [name] Other Clinician: Referring August Longest: Treating Charity Tessier/Extender: [name] in [name]: 90 Active Inactive Wound/Skin Impairment Nursing Diagnoses: Impaired tissue integrity Knowledge deficit related to smoking impact on wound healing Knowledge deficit related to ulceration/compromised skin integrity Goals: Patient will demonstrate a reduced rate of smoking or cessation of smoking Date Initiated: 07/21/2019 Date Inactivated: 11/23/2019 Target Resolution Date: 11/12/2019 Goal Status: Met Patient/caregiver will verbalize understanding of skin care regimen Date Initiated: 07/21/2019 Target Resolution Date: 03/31/2020 Goal Status: Active Ulcer/skin breakdown will have a volume reduction of 30% by week 4 Date Initiated: 07/21/2019 Date Inactivated: 09/23/2019 Target Resolution Date: 09/17/2019 Unmet Reason: see wound Goal Status: Unmet measurements. Interventions: Assess patient/caregiver ability to obtain necessary  supplies Assess patient/caregiver ability to perform ulcer/skin care regimen upon admission and as needed Assess ulceration(s) every visit Provide education on smoking Provide education on ulcer and skin care [name] Activities: Skin care regimen initiated : 07/21/2019 Topical wound management initiated : 07/21/2019 Notes: Electronic Signature(s) Signed: 03/17/2020 4:10:39 PM By: Kela Millin Entered By: Kela Millin on 03/17/2020 08:31:49 -------------------------------------------------------------------------------- Pain Assessment Details Patient Name: Date of Service: [name] 03/17/2020 8:30 A Casey Medical Record Number: [id number] Patient Account Number: [id number] Date of Birth/Sex: Treating RN: 06-Jun-1941 (79 y.o. [name]) [name] Primary Care Jessicca Stitzer: [name] Other Clinician: Referring Kelsi Benham: Treating Lummie Montijo/Extender: [name]: 34 Active Problems Location of Pain Severity and Description of Pain Patient Has Paino No Site Locations Pain Management and Medication Current Pain Management: Electronic Signature(s) Signed: 03/17/2020 4:20:16 PM By: Casey Coria RN Entered By: [name] on 03/17/2020 08:42:19 -------------------------------------------------------------------------------- Patient/Caregiver Education Details Patient Name: Date of Service: Casey Kathe Mariner, [name] [name] 10/1/2021andnbsp8:30 A Casey Medical Record Number: [id number] Patient Account Number: [id number] Date of Birth/Gender: Treating RN: July 14, 1940 (79 y.o. Marvis [name] Primary Care Physician: [name] Other Clinician: Referring Physician: Treating Physician/Extender: [name] in [name]: 87 Education Assessment Education Provided To: Patient Education Topics Provided Smoking and Wound Healing: Methods: Explain/Verbal Responses:  State content  correctly Wound/Skin Impairment: Handouts: Caring for Your Ulcer Methods: Explain/Verbal Responses: State content correctly Electronic Signature(s) Signed: 03/17/2020 4:10:39 PM By: Kela Millin Entered By: Kela Millin on 03/17/2020 08:32:09 -------------------------------------------------------------------------------- Wound Assessment Details Patient Name: Date of Service: [name] 03/17/2020 8:30 A Casey Medical Record Number: [id number] Patient Account Number: [id number] Date of Birth/Sex: Treating RN: 12/17/1940 (79 y.o. [name]) [name] Primary Care : [name] Other Clinician: Referring : Treating /Extender: [name]: 34 Wound Status Wound Number: 21 Primary Diabetic Wound/Ulcer of the Lower Extremity Etiology: Wound Location: Left, Plantar Foot Wound Open Wounding Event: Gradually Appeared Status: Date Acquired: 06/17/2018 Comorbid Cataracts, Glaucoma, Hypertension, Peripheral Arterial Disease, Weeks Of [name]: 34 History: Type II Diabetes, Osteomyelitis, Neuropathy Clustered Wound: No Wound Measurements Length: (cm) 0.2 Width: (cm) 0.3 Depth: (cm) 0.1 Area: (cm) 0.047 Volume: (cm) 0.005 % Reduction in Area: 98% % Reduction in Volume: 99.6% Epithelialization: None Tunneling: No Undermining: No Wound Description Classification: Grade 2 Wound Margin: Thickened Exudate Amount: Medium Exudate Type: Serosanguineous Exudate Color: red, brown Foul Odor After Cleansing: Yes Due to Product Use: No Slough/Fibrino No Wound Bed Granulation Amount: Large (67-100%) Exposed Structure Granulation Quality: Pink Fascia Exposed: No Necrotic Amount: None Present (0%) Fat Layer (Subcutaneous Tissue) Exposed: Yes Tendon Exposed: No Muscle Exposed: No Joint Exposed: No Bone Exposed: No Electronic Signature(s) Signed: 03/17/2020 4:20:16 PM By: Casey Coria RN Entered  By: [name] on 03/17/2020 08:43:11 -------------------------------------------------------------------------------- Wound Assessment Details Patient Name: Date of Service: [name] 03/17/2020 8:30 A Casey Medical Record Number: [id number] Patient Account Number: [id number] Date of Birth/Sex: Treating RN: 1941-05-25 (79 y.o. [name]) [name] Primary Care : [name] Other Clinician: Referring : Treating /Extender: [name]: 34 Wound Status Wound Number: 22 Primary Diabetic Wound/Ulcer of the Lower Extremity Etiology: Wound Location: Right Metatarsal head first Wound Open Wounding Event: Gradually Appeared Status: Date Acquired: 06/17/2018 Comorbid Cataracts, Glaucoma, Hypertension, Peripheral Arterial Disease, Weeks Of [name]: 34 History: Type II Diabetes, Osteomyelitis, Neuropathy Clustered Wound: No Wound Measurements Length: (cm) 0.5 Width: (cm) 0.4 Depth: (cm) 0.1 Area: (cm) 0.157 Volume: (cm) 0.016 % Reduction in Area: 75% % Reduction in Volume: 91.5% Epithelialization: Small (1-33%) Tunneling: No Undermining: No Wound Description Classification: Grade 2 Wound Margin: Thickened Exudate Amount: Medium Exudate Type: Serosanguineous Exudate Color: red, brown Foul Odor After Cleansing: Yes Due to Product Use: No Slough/Fibrino No Wound Bed Granulation Amount: Large (67-100%) Exposed Structure Granulation Quality: Red Fascia Exposed: No Necrotic Amount: None Present (0%) Fat Layer (Subcutaneous Tissue) Exposed: Yes Tendon Exposed: No Muscle Exposed: No Joint Exposed: No Bone Exposed: No Electronic Signature(s) Signed: 03/17/2020 4:20:16 PM By: Casey Coria RN Entered By: [name] on 03/17/2020 08:43:20 -------------------------------------------------------------------------------- Wound Assessment Details Patient Name: Date of Service: [name]  03/17/2020 8:30 A Casey Medical Record Number: [id number] Patient Account Number: [id number] Date of Birth/Sex: Treating RN: 03-14-1941 (79 y.o. Marvis [name] Primary Care : [name] Other Clinician: Referring : Treating /Extender: [name] in [name]: 34 Wound Status Wound Number: 24 Primary Diabetic Wound/Ulcer of the Lower Extremity Etiology: Wound Location: Left Calcaneus Wound Open Wounding Event: Gradually Appeared Status: Date Acquired: 02/28/2020 Comorbid Cataracts, Glaucoma, Hypertension, Peripheral Arterial Disease, Weeks Of [name]: 2 History: Type II Diabetes, Osteomyelitis, Neuropathy Clustered Wound: No Wound Measurements Length: (cm) 2 Width: (cm) 2 Depth: (cm) 0.1 Area: (cm) 3.142 Volume: (  cm) 0.314 % Reduction in Area: -244.9% % Reduction in Volume: -15% Epithelialization: Large (67-100%) Tunneling: No Undermining: No Wound Description Classification: Grade 2 Wound Margin: Distinct, outline attached Exudate Amount: Medium Exudate Type: Serosanguineous Exudate Color: red, brown Wound Bed Granulation Amount: Large (67-100%) Granulation Quality: Red, Pink Necrotic Amount: None Present (0%) Foul Odor After Cleansing: No Slough/Fibrino No Exposed Structure Fascia Exposed: No Fat Layer (Subcutaneous Tissue) Exposed: Yes Tendon Exposed: No Muscle Exposed: No Joint Exposed: No Bone Exposed: No Electronic Signature(s) Signed: 03/17/2020 4:10:39 PM By: Kela Millin Entered By: Kela Millin on 03/17/2020 08:52:40 -------------------------------------------------------------------------------- Vitals Details Patient Name: Date of Service: [name] 03/17/2020 8:30 A Casey Medical Record Number: [id number] Patient Account Number: [id number] Date of Birth/Sex: Treating RN: August 13, 1940 (79 y.o. [name]) [name] Primary Care : [name] Other  Clinician: Referring : Treating /Extender: [name]: 34 Vital Signs Time Taken: 08:41 Temperature (F): 98.4 Height (in): 73 Pulse (bpm): 62 Weight (lbs): 180 Respiratory Rate (breaths/min): 18 Body Mass Index (BMI): 23.7 Blood Pressure (mmHg): 108/60 Reference Range: 80 - 120 mg / dl Electronic Signature(s) Signed: 03/17/2020 4:20:16 PM By: Casey Coria RN Entered By: [name] on 03/17/2020 08:42:12

## 2020-03-17 NOTE — Progress Notes (Signed)
Casey, [name] ([id number]) Visit Report for 03/17/2020 Debridement Details Patient Name: Date of Service: [name], [place] [name] 03/17/2020 8:30 A M Medical Record Number: [id number] Patient Account Number: [id number] Date of Birth/Sex: Treating RN: 28-Jun-1940 (79 y.o. [name] Primary Care Provider: Leeroy [name] Other Clinician: Referring Provider: Treating Provider/Extender: [name] in Treatment: 34 Debridement Performed for Assessment: Wound #24 Left Calcaneus Performed By: Physician [name]., MD Debridement Type: Debridement Severity of Tissue Pre Debridement: Limited to breakdown of skin Level of Consciousness (Pre-procedure): Awake and Alert Pre-procedure Verification/Time Out Yes - 08:50 Taken: Start Time: 08:50 Pain Control: Other : benzocaine, 20% T Area Debrided (L x W): otal 2 (cm) x 2 (cm) = 4 (cm) Tissue and other material debrided: Viable, Non-Viable, Callus, Skin: Dermis Level: Skin/Dermis Debridement Description: Selective/Open Wound Instrument: Curette Specimen: Swab, Number of Specimens T aken: 1 Bleeding: Minimum Hemostasis Achieved: Pressure End Time: 08:51 Procedural Pain: 0 Post Procedural Pain: 0 Response to Treatment: Procedure was tolerated well Level of Consciousness (Post- Awake and Alert procedure): Post Debridement Measurements of Total Wound Length: (cm) 2 Width: (cm) 2 Depth: (cm) 0.1 Volume: (cm) 0.314 Character of Wound/Ulcer Post Debridement: Improved Severity of Tissue Post Debridement: Fat layer exposed Post Procedure Diagnosis Same as Pre-procedure Electronic Signature(s) Signed: 03/17/2020 4:10:39 PM By: Kela Millin Signed: 03/17/2020 4:24:19 PM By: Linton Ham MD Entered By: Linton Ham on 03/17/2020 08:58:49 -------------------------------------------------------------------------------- HPI Details Patient Name: Date of Service: [name]  03/17/2020 8:30 A M Medical Record Number: [id number] Patient Account Number: [id number] Date of Birth/Sex: Treating RN: [birth year] (79 y.o. [name] Primary Care Provider: Leeroy [name] Other Clinician: Referring Provider: Treating Provider/Extender: [name] in Treatment: 57 History of Present Illness HPI Description: very pleasant 79 year old gentleman has type 2 diabetes and has been having an ulcer on his foot for several years. Been seeing as this time around since the end of October. Classified as a Wagner grade 2 because his previous x-rays did not show any problems. He has an MRI pending this Friday. discussing with him he says he has not been able to get his diabetic shoes yet. He continues to smoke his pipe and drinks moonshine and says he will not give this up. I have tried to counsel him regarding this but he firmly says that he is happy to listen to me but he is not going to give up his habits. 08/17/14 -- patient has no fresh complaints but has recently come with his MRI which was done on 08/12/2014. 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 Readmission: 07/21/2019 upon evaluation today patient presents for initial inspection here in our clinic concerning issues that he has been having with his bilateral feet. He has open wounds that been present at least since the beginning of the year although he does not know an exact time. He is previously had amputations of all toes and the distal portion of his foot. 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 With that being said he does have a history of diabetes, peripheral vascular disease, nicotine dependence, and hypertension. He has not had any recent arterial or vascular studies he was noncompressible today I think he is going to require referral for arterial  studies as well. 2/11; patient was readmitted to our clinic last week. He has bilateral plantar foot wounds in  the setting of previous transmetatarsal amputations. He had his arterial studies done on 2/8; these were actually quite good. He was noncompressible on both sides however his waveforms were triphasic. The thought was noncompressible vessels consistent with medial calcification but without significant stenosis in the lower extremities. X-rays were done on the left this showed extensive chronic and postoperative changes without evidence of osteomyelitis. There was no evidence of osteomyelitis on the right foot there was mild vascular calcification MRI was suggested on the left based on clinical correlation. The patient arrives in clinic with some odor and drainage from the left foot. Right foot measures smaller. He is really not offloading these areas although he says he does not walk much 2/25; the patient arrives with some odor and drainage from the left foot again. Post debridement I cultured this area. He has the area on the midfoot roughly the third metatarsal head and the first metatarsal head on the right. He is in surgical shoes bilateral transmet 3/4; culture from last week showed abundant group B strep and a few Proteus. We will put him on cephalexin starting today. Still silver alginate to the wounds 3/11; due to issues with pharmacy he still does not have the cephalexin that I prescribed a week ago. Apparently this was called into a different pharmacy on Friday but they have not delivered it to him. We have been using silver alginate on the wounds but because of lack of moisture change to silver collagen today. We are offloading as best we can in surgical shoes 3/18; he is taking the cephalexin that I prescribed 2 weeks ago. Using silver collagen. He has surgical shoes. Setting of bilateral previous transmetatarsal amputations. Wounds are mirror-image wounds medially. 3/25;  mirror-image wounds in the setting of previous transmetatarsal amputation. The wounds are mirror image medial foot wounds. We have been using silver collagen. We are using surgical shoes offloaded with felt. He came in today asking about his "$900" custom-made shoes although after talking to them it does not appear that he ever really wore these and they are more than a year old 4/8; mirror-image wounds on the medial part of the plantar feet in the setting of previous transmetatarsal amputations. We have been using silver collagen. I aggressively debrided these 2 weeks ago removing callus subcutaneous tissue from around the wound margins. He did indeed bring echo he has custom-made shoes with transmetatarsal inserts. He has never worn these. He claims to be not spending a lot of time on his feet 4/15; mirror-image wounds on the plantar feet in the setting of previous transmetatarsal amputation. We have been using silver alginate. Culture that I did last time grew MRSA and Proteus. I have him on doxycycline. This was a deep tissue culture. The Proteus was not actively plated against doxycycline but was otherwise pansensitive I think the doxycycline would cover this. His wounds actually looks somewhat better. Rolled senescent edges still around the wound edges 4/29; mirror-image wounds on the plantar feet in the setting of previous bilateral transmetatarsal amputations we have been using silver alginate. Last time I saw him 2 weeks ago I gave him a course of doxycycline. Things seem to have cleaned up. This is in response to a deep tissue culture that showed MRSA and Proteus 5/6; wounds on the bilateral plantar feet in the setting of previous transmetatarsal amputations. We have been using silver alginate. Arrives in the clinic today with the same raised thick callus and subcutaneous tissue. He has home health changing the  dressing. He brought up the fact that he has transportation through [facility] apparently he only has "12 visits". Otherwise he has the pay $40 for a cab ride there and back from his home which I guess [place] [address]. 11/11/19-Patient back with left transmet plantar surface much worse with extension of devitalized tissue proximally from the original wound with dense callus rim, the right is about the same, apparently patient was wearing tennis shoes at home per home health, although patient swears that he has been using his offloading devices that he has with inserts. 6/8; I have not seen this patient in over a month however he was seen last week in my absence. He has mirror-image wounds over his bilateral transmit plantar surfaces. He has been using silver alginate. He comes in today with literally a mountain of callus around the 2 wounds on the left. The area on the right somewhat better but still callus and nonviable tissue around the circular wound with some depth. He had an x-ray done last week of the left foot that was negative a culture showed multiple organisms. He did not get any antibiotics. He has custom-made shoes and apparently is getting another pair from [employer] that is in the process. I told him that we are not offloading the sufficiently for what ever reason. I do not think he is wearing his custom shoes at home although he says he is not walking that much 6/17 bilateral plantar foot in the setting of previous transmetatarsal amputations. He has been using silver alginate. He has his own custom made shoes which he is supposed to have it replaced in the next week or 2. We have been extensively debriding the callus around both wound sites and things look slightly better today 6/24; patient arrives in clinic today with her intake nurse pointing out odor and drainage. We have been using silver alginate. I was going to put a total contact cast on this leg today but in view of the odor and drainage will postpone that 7/1; intake nurse still points out  odor and drainage. Last week I did a deep tissue culture of the right foot this grew a few methicillin resistant staph aureus, few group B strep and a few Pseudomonas. No anaerobes were isolated. He will require a course of 2 different antibiotics probably ciprofloxacin and doxycycline. As usual he has thick callus around the wound 7/15; copious amounts of callus. X-rays I did last time showed chronic changes versus possible osteomyelitis at the plantar aspect of the proximal metatarsal. We will therefore go ahead with the MRI. 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 We have been using silver alginate. 7/22; patient's MRI is not until 01/19/2020. Still copious amounts of thick callus subcutaneous tissue from around the wound margins. We have been using silver alginate but apparently home health is not actually putting these in the wounds especially the right with the undermining area laterally 8/26; patient has not been here in over a month. He did not get his MRI done and he is not rebooked it. I am not exactly sure what he has been doing to these wounds on his bilateral feet. He comes in today with odor, thick callus around both wound areas. He is not really offloading this to any major degree. We have been using silver alginate 9/17; 3-week follow-up. The reason for his noncompliance with clinic visits is not really clear. He  has never gotten his MRI done and to be honest I basically given up that he will participate in this. He is using silver alginate, he has home health changing this 3 times a week. He has diabetic boots but obviously he is not able to offload this properly. He arrives in with copious amounts of callus around the wounds. In addition he has a new wound on the left heel 9/24; his MRI is booked for next Tuesday on the right foot. He has an area over the right first metatarsal head left plantar foot and left  plantar heel. 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 There is no evidence of septic arthritis. He was felt to have mild diffuse forefoot cellulitis. No evidence of mild fasciitis or polyp pyomyositis. The ankle and the subtalar joints were maintained He arrives in clinic today with the wounds on the met heads smaller. The area on the heel had a blister underneath the area which was callused. I have looked back through his past medical history. He is followed for wounds on his bilateral feet dating back into 2019 largely by Dr. Sharol Given. I think these were the same forefoot wounds he is had currently. 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 He has never really offloaded this properly. He saw Dr. De Burrs of infectious disease in July 2019 out of concern for osteomyelitis of the left foot. He both had inflammatory markers at that time that showed an ESR of 34 and a CRP of 17.1 neither 1 of these was grossly elevated from his previous values ultimately he was not felt to have active osteomyelitis on the left foot. It was noted that his CRP was a bit elevated but with a near normal ESR no particular suggestion of osteomyelitis he did not follow-up with infectious disease Electronic Signature(s) Signed: 03/17/2020 4:24:19 PM By: Linton Ham MD Entered By: Linton Ham on 03/17/2020 09:02:26 -------------------------------------------------------------------------------- Physical Exam Details Patient Name: Date of Service: [name] 03/17/2020 8:30 A M Medical Record Number: [id number] Patient Account Number: [id number] Date of Birth/Sex: Treating RN: 1941/05/05 (79 y.o. [name] Primary Care Provider: Leeroy [name] Other Clinician: Referring Provider: Treating Provider/Extender: [name] in Treatment: 34 Constitutional Sitting or standing Blood Pressure is within target range for patient.. Pulse regular and within target range for patient.Marland Kitchen Respirations regular, non-labored and within target range.. Temperature is normal and within the target range for the patient.Marland Kitchen Appears in no distress. Notes Wound exam The patient has wounds over his right and left metatarsal heads these are smaller. A lot less callus is present. The right first metatarsal wound looks healthy. Similarly the area on the left also looks quite a bit better. Our intake nurse reported a blister under the ulcer on the left heel which was otherwise closed I debrided this area with a #5 curette cultured what looked like semipurulent drainage. Electronic Signature(s) Signed: 03/17/2020 4:24:19 PM By: Linton Ham MD Entered By: Linton Ham on 03/17/2020 09:03:30 -------------------------------------------------------------------------------- Physician Orders Details Patient Name: Date of Service: [name] 03/17/2020 8:30 A M Medical Record Number: [id number] Patient Account Number: [id number] Date of Birth/Sex: Treating RN: Nov 24, 1940 (79 y.o. [name] Primary Care Provider: Leeroy [name] Other Clinician: Referring Provider: Treating Provider/Extender: Marlow Baars  Weeks in Treatment: 74 Verbal / Phone Orders: No Diagnosis Coding ICD-10 Coding Code Description E11.621 Type 2 diabetes mellitus with foot ulcer L97.522 Non-pressure chronic ulcer of other part of left foot with fat layer exposed L97.512 Non-pressure chronic ulcer of other part of right foot with fat layer exposed I73.89 Other specified peripheral vascular diseases L97.422 Non-pressure chronic ulcer of left heel and midfoot with fat layer exposed Follow-up Appointments Return Appointment in 1 week. Dressing Change Frequency Wound #21  Left,Plantar Foot Change dressing three times week. Wound #22 Right Metatarsal head first Change dressing three times week. Wound #24 Left Calcaneus Change dressing three times week. Wound Cleansing Clean wound with Wound Cleanser - or normal saline Primary Wound Dressing Wound #21 Left,Plantar Foot Calcium Alginate with Silver Wound #22 Right Metatarsal head first Calcium Alginate with Silver Wound #24 Left Calcaneus Calcium Alginate with Silver Secondary Dressing Wound #21 Left,Plantar Foot Foam - foam donut to offload Kerlix/Rolled Gauze Dry Gauze Wound #22 Right Metatarsal head first Foam - foam donut to offload Kerlix/Rolled Gauze Dry Gauze Wound #24 Left Calcaneus Kerlix/Rolled Gauze Dry Gauze Heel Cup Edema Control Avoid standing for long periods of time Elevate legs to the level of the heart or above for 30 minutes daily and/or when sitting, a frequency of: - throughout the day. Off-Loading Other: - felt patient's personal shoes. patient to wear diabetic shoes. patient to limit about of time spent walking and standing. [facility] skilled [facility] for wound care. - [facility] naerobe culture (MICRO) - Wound Culture of Left Calcaneous - (ICD10 [id number] - Non-pressure Bacteria identified in Unspecified specimen by A chronic ulcer of left heel and midfoot with fat layer exposed) LOINC Code: 694-8 Convenience Name: Anerobic culture CBC W A Differential panel in Blood (HEM-CBC) - (ICD10 L97.522 - Non-pressure chronic ulcer of other part of left foot with fat layer exposed) uto LOINC Code: R4260623 Convenience Name: CBC W Auto Differential panel C reactive protein [Mass/volume] in Serum or Plasma (CHEM) - (ICD10 L97.422 - Non-pressure chronic ulcer of left heel and midfoot with fat layer exposed) LOINC Code: 1988-5 Convenience Name: C Reactive Protein in serum or plasma Erythrocyte sedimentation rate (HEM) - (ICD10 L97.422 - Non-pressure  chronic ulcer of left heel and midfoot with fat layer exposed) LOINC Code: 54627-0 Convenience Name: Sed rate-method unspecified Electronic Signature(s) Signed: 03/17/2020 4:10:39 PM By: Kela Millin Signed: 03/17/2020 4:24:19 PM By: Linton Ham MD Signed: 03/17/2020 4:24:19 PM By: Linton Ham MD Entered By: Kela Millin on 03/17/2020 08:54:12 Prescription 03/17/2020 -------------------------------------------------------------------------------- Blair Promise MD Patient Name: Provider: 1940-09-06 [id number] Date of Birth: NPI#: [name] [id number] Sex: DEA #: [phone] [id number] Phone #: License #: [place] Patient Address: [address] [place], [place] [address] [place], [place] [address] [phone] Allergies No Known Drug Allergies Provider's Orders CBC W A Differential panel in Blood - ICD10: L97.522 uto LOINC Code: 50932-6 Convenience Name: CBC W Auto Differential panel Hand Signature: Date(s): Prescription 03/17/2020 Blair Promise MD Patient Name: Provider: 11/12/1940 [id number] Date of Birth: NPI#: [name] [id number] Sex: DEA #: [phone] [id number] Phone #: License #: [place] Patient Address: [address] [place], [place] [address] [place], [place] [address] [phone] Allergies No Known Drug Allergies Provider's Orders C reactive protein [Mass/volume] in Serum or Plasma - ICD10: L97.422 LOINC Code: 1988-5 Convenience Name:  C Reactive Protein in serum or plasma Hand Signature: Date(s): Prescription 03/17/2020 Blair Promise MD Patient Name: Provider: [birth year] [id number] Date of Birth: NPI#: [name] [id number] Sex: DEA #: [phone] [id number] Phone #: License #: [place] Patient Address: [address] [place], [place] [address] [place], [place] [address] [phone] Allergies No Known Drug Allergies Provider's Orders Erythrocyte sedimentation rate - ICD10: L97.422 LOINC Code: 88280-0 Convenience Name: Sed rate-method unspecified Hand Signature: Date(s): Electronic Signature(s) Signed: 03/17/2020 4:10:39 PM By: Kela Millin Signed: 03/17/2020 4:24:19 PM By: Linton Ham MD Entered By: Kela Millin on 03/17/2020 08:54:14 -------------------------------------------------------------------------------- Problem List Details Patient Name: Date of Service: [name] 03/17/2020 8:30 A M Medical Record Number: [id number] Patient Account Number: [id number] Date of Birth/Sex: Treating RN: 05/30/1941 (79 y.o. [name] Primary Care Provider: Leeroy [name] Other Clinician: Referring Provider: Treating Provider/Extender: [name] in Treatment: 16 Active Problems ICD-10 Encounter Code Description Active Date MDM Diagnosis E11.621 Type 2 diabetes mellitus with foot ulcer 07/21/2019 No Yes L97.522 Non-pressure chronic ulcer of other part of left foot with fat layer exposed 07/21/2019 No Yes L97.512 Non-pressure chronic ulcer of other part of right foot with fat layer exposed 07/21/2019 No Yes I73.89 Other specified peripheral vascular diseases 07/21/2019 No Yes L97.422 Non-pressure chronic ulcer of left heel and midfoot with fat layer exposed 03/03/2020 No Yes Inactive Problems ICD-10 Code Description Active Date Inactive Date F17.218 Nicotine dependence, cigarettes, with other nicotine-induced disorders 07/21/2019 07/21/2019 L03.115 Cellulitis of right lower limb 12/16/2019 12/16/2019 I10 Essential (primary) hypertension 07/21/2019 07/21/2019 L84 Corns and callosities 07/21/2019 07/21/2019 Resolved Problems Electronic Signature(s) Signed: 03/17/2020 4:24:19 PM By: Linton Ham MD Entered By: Linton Ham on 03/17/2020 08:55:51 -------------------------------------------------------------------------------- Progress Note Details Patient Name: Date of Service: [name] 03/17/2020 8:30 A M Medical Record Number: [id number] Patient Account Number: [id number] Date of Birth/Sex: Treating RN: 05/29/1941 (79 y.o. [name] Primary Care Provider: Leeroy [name] Other Clinician: Referring Provider: Treating Provider/Extender: [name] in Treatment: 34 Subjective History of Present Illness (HPI) very pleasant 79 year old gentleman has type 2 diabetes and has been having an ulcer on his foot for several years. Been seeing as this time around since the end of October. Classified as a Wagner grade 2 because his previous x-rays did not show any problems. He has an MRI pending this Friday. discussing with him he says he has not been able to get his diabetic shoes yet. He continues to smoke his pipe and drinks moonshine and says he will not give this up. I have tried to counsel him regarding this but he firmly says that he is happy to listen to me but he is not going to give up his habits. 08/17/14 -- patient has no fresh complaints but has recently come with his MRI which was done on 08/12/2014. 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 Readmission: 07/21/2019 upon evaluation today patient presents for initial inspection here in our clinic concerning issues that he has been having with his bilateral feet. He has open wounds that been present at least since the beginning of the year although he does not know an exact time. He is previously had amputations of all toes and the distal portion of his foot. Essentially a transmetatarsal  amputation. Amputation bilaterally. With that being said he does have a history of diabetes, peripheral  vascular disease, nicotine dependence, and hypertension. He has not had any recent arterial or vascular studies he was noncompressible today I think he is going to require referral for arterial studies as well. 2/11; patient was readmitted to our clinic last week. He has bilateral plantar foot wounds in the setting of previous transmetatarsal amputations. He had his arterial studies done on 2/8; these were actually quite good. He was noncompressible on both sides however his waveforms were triphasic. The thought was noncompressible vessels consistent with medial calcification but without significant stenosis in the lower extremities. X-rays were done on the left this showed extensive chronic and postoperative changes without evidence of osteomyelitis. There was no evidence of osteomyelitis on the right foot there was mild vascular calcification MRI was suggested on the left based on clinical correlation. The patient arrives in clinic with some odor and drainage from the left foot. Right foot measures smaller. He is really not offloading these areas although he says he does not walk much 2/25; the patient arrives with some odor and drainage from the left foot again. Post debridement I cultured this area. He has the area on the midfoot roughly the third metatarsal head and the first metatarsal head on the right. He is in surgical shoes bilateral transmet 3/4; culture from last week showed abundant group B strep and a few Proteus. We will put him on cephalexin starting today. Still silver alginate to the wounds 3/11; due to issues with pharmacy he still does not have the cephalexin that I prescribed a week ago. Apparently this was called into a different pharmacy on Friday but they have not delivered it to him. We have been using silver alginate on the wounds but because of lack of moisture change to silver collagen today. We are offloading as best we can in surgical shoes 3/18; he is taking the  cephalexin that I prescribed 2 weeks ago. Using silver collagen. He has surgical shoes. Setting of bilateral previous transmetatarsal amputations. Wounds are mirror-image wounds medially. 3/25; mirror-image wounds in the setting of previous transmetatarsal amputation. The wounds are mirror image medial foot wounds. We have been using silver collagen. We are using surgical shoes offloaded with felt. He came in today asking about his "$900" custom-made shoes although after talking to them it does not appear that he ever really wore these and they are more than a year old 4/8; mirror-image wounds on the medial part of the plantar feet in the setting of previous transmetatarsal amputations. We have been using silver collagen. I aggressively debrided these 2 weeks ago removing callus subcutaneous tissue from around the wound margins. He did indeed bring echo he has custom-made shoes with transmetatarsal inserts. He has never worn these. He claims to be not spending a lot of time on his feet 4/15; mirror-image wounds on the plantar feet in the setting of previous transmetatarsal amputation. We have been using silver alginate. Culture that I did last time grew MRSA and Proteus. I have him on doxycycline. This was a deep tissue culture. The Proteus was not actively plated against doxycycline but was otherwise pansensitive I think the doxycycline would cover this. His wounds actually looks somewhat better. Rolled senescent edges still around the wound edges 4/29; mirror-image wounds on the plantar feet in the setting of previous bilateral transmetatarsal amputations we have been using silver alginate. Last time I saw him 2 weeks ago I gave  him a course of doxycycline. Things seem to have cleaned up. This is in response to a deep tissue culture that showed MRSA and Proteus 5/6; wounds on the bilateral plantar feet in the setting of previous transmetatarsal amputations. We have been using silver alginate.  Arrives in the clinic today with the same raised thick callus and subcutaneous tissue. He has home health changing the dressing. He brought up the fact that he has transportation through [facility] apparently he only has "12 visits". Otherwise he has the pay $40 for a cab ride there and back from his home which I guess [place] [address]. 11/11/19-Patient back with left transmet plantar surface much worse with extension of devitalized tissue proximally from the original wound with dense callus rim, the right is about the same, apparently patient was wearing tennis shoes at home per home health, although patient swears that he has been using his offloading devices that he has with inserts. 6/8; I have not seen this patient in over a month however he was seen last week in my absence. He has mirror-image wounds over his bilateral transmit plantar surfaces. He has been using silver alginate. He comes in today with literally a mountain of callus around the 2 wounds on the left. The area on the right somewhat better but still callus and nonviable tissue around the circular wound with some depth. He had an x-ray done last week of the left foot that was negative a culture showed multiple organisms. He did not get any antibiotics. He has custom-made shoes and apparently is getting another pair from [employer] that is in the process. I told him that we are not offloading the sufficiently for what ever reason. I do not think he is wearing his custom shoes at home although he says he is not walking that much 6/17 bilateral plantar foot in the setting of previous transmetatarsal amputations. He has been using silver alginate. He has his own custom made shoes which he is supposed to have it replaced in the next week or 2. We have been extensively debriding the callus around both wound sites and things look slightly better today 6/24; patient arrives in clinic today with her intake nurse pointing out odor and  drainage. We have been using silver alginate. I was going to put a total contact cast on this leg today but in view of the odor and drainage will postpone that 7/1; intake nurse still points out odor and drainage. Last week I did a deep tissue culture of the right foot this grew a few methicillin resistant staph aureus, few group B strep and a few Pseudomonas. No anaerobes were isolated. He will require a course of 2 different antibiotics probably ciprofloxacin and doxycycline. As usual he has thick callus around the wound 7/15; copious amounts of callus. X-rays I did last time showed chronic changes versus possible osteomyelitis at the plantar aspect of the proximal metatarsal. We will therefore go ahead with the MRI. 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 We have been using silver alginate. 7/22; patient's MRI is not until 01/19/2020. Still copious amounts of thick callus subcutaneous tissue from around the wound margins. We have been using silver alginate but apparently home health is not actually putting these in the wounds especially the right with the undermining area laterally 8/26; patient has not been here in over a month. He did not get his MRI done and he  is not rebooked it. I am not exactly sure what he has been doing to these wounds on his bilateral feet. He comes in today with odor, thick callus around both wound areas. He is not really offloading this to any major degree. We have been using silver alginate 9/17; 3-week follow-up. The reason for his noncompliance with clinic visits is not really clear. He has never gotten his MRI done and to be honest I basically given up that he will participate in this. He is using silver alginate, he has home health changing this 3 times a week. He has diabetic boots but obviously he is not able to offload this properly. He arrives in with copious amounts of callus around the  wounds. In addition he has a new wound on the left heel 9/24; his MRI is booked for next Tuesday on the right foot. He has an area over the right first metatarsal head left plantar foot and left plantar heel. 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 There is no evidence of septic arthritis. He was felt to have mild diffuse forefoot cellulitis. No evidence of mild fasciitis or polyp pyomyositis. The ankle and the subtalar joints were maintained He arrives in clinic today with the wounds on the met heads smaller. The area on the heel had a blister underneath the area which was callused. I have looked back through his past medical history. He is followed for wounds on his bilateral feet dating back into 2019 largely by Dr. Sharol Given. I think these were the same forefoot wounds he is had currently. 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 He has never really offloaded this properly. He saw Dr. De Burrs of infectious disease in July 2019 out of concern for osteomyelitis of the left foot. He both had inflammatory markers at that time that showed an ESR of 34 and a CRP of 17.1 neither 1 of these was grossly elevated from his previous values ultimately he was not felt to have active osteomyelitis on the left foot. It was noted that his CRP was a bit elevated but with a near normal ESR no particular suggestion of osteomyelitis he did not follow-up with infectious disease Objective Constitutional Sitting or standing Blood Pressure is within target range for patient.. Pulse regular and within target range for patient.Marland Kitchen Respirations regular, non-labored and within target range.. Temperature is normal and within the target range for the patient.Marland Kitchen Appears in no distress. Vitals Time Taken: 8:41 AM, Height: 73 in, Weight: 180 lbs, BMI: 23.7, Temperature:  98.4 F, Pulse: 62 bpm, Respiratory Rate: 18 breaths/min, Blood Pressure: 108/60 mmHg. General Notes: Wound exam ooThe patient has wounds over his right and left metatarsal heads these are smaller. A lot less callus is present. The right first metatarsal wound looks healthy. Similarly the area on the left also looks quite a bit better. ooOur intake nurse reported a blister under the ulcer on the left heel which was otherwise closed I debrided this area with a #5 curette cultured what looked like semipurulent drainage. Integumentary (Hair, Skin) Wound #21 status is Open. Original cause of wound was Gradually Appeared. The wound is located on the [place]. The wound measures 0.2cm length x 0.3cm width x 0.1cm depth; 0.047cm^2 area and 0.005cm^3 volume. There is Fat Layer (Subcutaneous Tissue) exposed. There is no tunneling  or undermining noted. There is a medium amount of serosanguineous drainage noted. Foul odor after cleansing was noted. The wound margin is thickened. There is large (67- 100%) pink granulation within the wound bed. There is no necrotic tissue within the wound bed. Wound #22 status is Open. Original cause of wound was Gradually Appeared. The wound is located on the Right Metatarsal head first. The wound measures 0.5cm length x 0.4cm width x 0.1cm depth; 0.157cm^2 area and 0.016cm^3 volume. There is Fat Layer (Subcutaneous Tissue) exposed. There is no tunneling or undermining noted. There is a medium amount of serosanguineous drainage noted. Foul odor after cleansing was noted. The wound margin is thickened. There is large (67-100%) red granulation within the wound bed. There is no necrotic tissue within the wound bed. Wound #24 status is Open. Original cause of wound was Gradually Appeared. The wound is located on the Left Calcaneus. The wound measures 2cm length x 2cm width x 0.1cm depth; 3.142cm^2 area and 0.314cm^3 volume. There is Fat Layer (Subcutaneous Tissue)  exposed. There is no tunneling or undermining noted. There is a medium amount of serosanguineous drainage noted. The wound margin is distinct with the outline attached to the wound base. There is large (67-100%) red, pink granulation within the wound bed. There is no necrotic tissue within the wound bed. 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 There was a Selective/Open Wound Skin/Dermis Debridement with a total area of 4 sq cm performed by [name]., MD. With the following instrument(s): Curette to remove Viable and Non-Viable tissue/material. Material removed includes Callus and Skin: Dermis and after achieving pain control using Other (benzocaine, 20%). 1 specimen was taken by a Swab and sent to the lab per facility protocol. A time out was conducted at 08:50, prior to the start of the procedure. A Minimum amount of bleeding was controlled with Pressure. The procedure was tolerated well with a pain level of 0 throughout and a pain level of 0 following the procedure. Post Debridement Measurements: 2cm length x 2cm width x 0.1cm depth; 0.314cm^3 volume. Character of Wound/Ulcer Post Debridement is improved. Severity of Tissue Post Debridement is: Fat layer exposed. Post procedure Diagnosis Wound #24: Same as Pre-Procedure Plan Follow-up Appointments: Return Appointment in 1 week. Dressing Change Frequency: Wound #21 Left,Plantar Foot: Change dressing three times  week. Wound #22 Right Metatarsal head first: Change dressing three times week. Wound #24 Left Calcaneus: Change dressing three times week. Wound Cleansing: Clean wound with Wound Cleanser - or normal saline Primary Wound Dressing: Wound #21 Left,Plantar Foot: Calcium Alginate with Silver Wound #22 Right Metatarsal head first: Calcium Alginate with Silver Wound #24 Left Calcaneus: Calcium Alginate with Silver Secondary Dressing: Wound #21 Left,Plantar Foot: Foam - foam donut to offload Kerlix/Rolled Gauze Dry Gauze Wound #22 Right Metatarsal head first: Foam - foam donut to offload Kerlix/Rolled Gauze Dry Gauze Wound #24 Left Calcaneus: Kerlix/Rolled Gauze Dry Gauze Heel Cup Edema Control: Avoid standing for long periods of time Elevate legs to the level of the heart or above for  30 minutes daily and/or when sitting, a frequency of: - throughout the day. Off-Loading: Other: - felt patient's personal shoes. patient to wear diabetic shoes. patient to limit about of time spent walking and standing. Home Health: [facility] for wound care. - [facility] ordered were: Anerobic culture - Wound Culture of Left Calcaneous, CBC W Auto Differential panel, C Reactive Protein in serum or plasma, Sed rate -method unspecified 1. I have repeated the patient's lab work including a CBC with differential sedimentation rate and C-reactive protein we can compare those from his values of July 2019 and previously 2. The area on the right first metatarsal head and the left metatarsal heads are look a lot better. 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 There is no wound in this area. I would not be able to biopsy this in the clinic can I am not recommending a surgical biopsy. 3. When he saw Dr. Linus Salmons in July 2019 it was noted that he was previously treated for septic arthritis of his foot he  refused labs and did not complete them. I believe this is at the time of a left transmetatarsal amputation. 4. I will await a culture of the left heel which had a scant amount of purulent looking material. I will await the culture to come back before giving him antibiotics. Furthermore I will see him again next week look at the sedimentation rate and C-reactive protein and decide whether to treat him here for osteomyelitis with oral agents or refer him to infectious disease. 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 Silver alginate to continue as the primary dressing. Offloading these areas has been a problem. I thought about putting the left foot in a total contact cast on both of both his feet look so much better today I had put that in the back burner for now I spent 30 minutes in review of this patient's past medical history; review of his MRI, face-to-face evaluation and preparation of this record. Electronic Signature(s) Signed: 03/17/2020 4:24:19 PM By: Linton Ham MD Entered By: Linton Ham on 03/17/2020 09:08:43 -------------------------------------------------------------------------------- SuperBill Details Patient Name: Date of Service: [place], [name] [name] 03/17/2020 Medical Record Number: [id number] Patient Account Number: [id number] Date of Birth/Sex: Treating RN: [id number] (79 y.o. [name] Primary Care Provider: Leeroy [name] Other Clinician: Referring Provider: Treating Provider/Extender: [name] in Treatment: 34 Diagnosis Coding ICD-10 Codes Code Description E11.621 Type 2 diabetes mellitus with foot ulcer L97.522 Non-pressure chronic ulcer of other part of left foot with fat layer exposed L97.512 Non-pressure chronic ulcer of other part of right foot with fat layer exposed I73.89 Other specified peripheral vascular diseases L97.422 Non-pressure chronic ulcer of left heel and midfoot with  fat layer exposed Facility Procedures The patient participates with Medicare or their insurance follows the Medicare Facility Guidelines: CPT4 Code Description Modifier Quantity [id number] - DEBRIDE WOUND 1ST 20 SQ CM OR < 1 ICD-10 Diagnosis Description L97.422 Non-pressure chronic ulcer  of left heel and midfoot with fat layer exposed Physician Procedures : CPT4 Code Description Modifier [id number] - WC PHYS DEBR WO ANESTH 20 SQ CM ICD-10 Diagnosis Description L97.422 Non-pressure chronic ulcer of left heel and midfoot with fat layer exposed Quantity: 1 Electronic Signature(s) Signed: 03/17/2020 4:24:19 PM By: Linton Ham MD Entered By: Linton Ham on 03/17/2020 09:09:39

## 2020-03-19 ENCOUNTER — Emergency Department (HOSPITAL_COMMUNITY): Payer: Medicare Other

## 2020-03-19 ENCOUNTER — Inpatient Hospital Stay (HOSPITAL_COMMUNITY)
Admission: EM | Admit: 2020-03-19 | Discharge: 2020-03-28 | DRG: 854 | Disposition: A | Discharge status: Skilled Nursing Facility | Payer: Medicare Other | Attending: Internal Medicine | Admitting: Internal Medicine

## 2020-03-19 ENCOUNTER — Encounter (HOSPITAL_COMMUNITY): Payer: Self-pay | Admitting: *Deleted

## 2020-03-19 ENCOUNTER — Other Ambulatory Visit: Payer: Self-pay

## 2020-03-19 DIAGNOSIS — Z79899 Other long term (current) drug therapy: Secondary | ICD-10-CM

## 2020-03-19 DIAGNOSIS — Z20822 Contact with and (suspected) exposure to covid-19: Secondary | ICD-10-CM | POA: Diagnosis present

## 2020-03-19 DIAGNOSIS — R531 Weakness: Secondary | ICD-10-CM

## 2020-03-19 DIAGNOSIS — N179 Acute kidney failure, unspecified: Secondary | ICD-10-CM | POA: Diagnosis present

## 2020-03-19 DIAGNOSIS — E1151 Type 2 diabetes mellitus with diabetic peripheral angiopathy without gangrene: Secondary | ICD-10-CM | POA: Diagnosis present

## 2020-03-19 DIAGNOSIS — D72829 Elevated white blood cell count, unspecified: Secondary | ICD-10-CM

## 2020-03-19 DIAGNOSIS — E1159 Type 2 diabetes mellitus with other circulatory complications: Secondary | ICD-10-CM

## 2020-03-19 DIAGNOSIS — E11621 Type 2 diabetes mellitus with foot ulcer: Secondary | ICD-10-CM | POA: Diagnosis present

## 2020-03-19 DIAGNOSIS — Z8673 Personal history of transient ischemic attack (TIA), and cerebral infarction without residual deficits: Secondary | ICD-10-CM

## 2020-03-19 DIAGNOSIS — I10 Essential (primary) hypertension: Secondary | ICD-10-CM | POA: Diagnosis present

## 2020-03-19 DIAGNOSIS — Z7902 Long term (current) use of antithrombotics/antiplatelets: Secondary | ICD-10-CM

## 2020-03-19 DIAGNOSIS — Z89431 Acquired absence of right foot: Secondary | ICD-10-CM

## 2020-03-19 DIAGNOSIS — E1140 Type 2 diabetes mellitus with diabetic neuropathy, unspecified: Secondary | ICD-10-CM | POA: Diagnosis present

## 2020-03-19 DIAGNOSIS — Z66 Do not resuscitate: Secondary | ICD-10-CM | POA: Diagnosis present

## 2020-03-19 DIAGNOSIS — Z89432 Acquired absence of left foot: Secondary | ICD-10-CM

## 2020-03-19 DIAGNOSIS — A419 Sepsis, unspecified organism: Principal | ICD-10-CM | POA: Diagnosis present

## 2020-03-19 DIAGNOSIS — Z7984 Long term (current) use of oral hypoglycemic drugs: Secondary | ICD-10-CM

## 2020-03-19 DIAGNOSIS — M86171 Other acute osteomyelitis, right ankle and foot: Secondary | ICD-10-CM | POA: Diagnosis present

## 2020-03-19 DIAGNOSIS — R6510 Systemic inflammatory response syndrome (SIRS) of non-infectious origin without acute organ dysfunction: Secondary | ICD-10-CM | POA: Diagnosis present

## 2020-03-19 DIAGNOSIS — L97529 Non-pressure chronic ulcer of other part of left foot with unspecified severity: Secondary | ICD-10-CM | POA: Diagnosis present

## 2020-03-19 DIAGNOSIS — L97519 Non-pressure chronic ulcer of other part of right foot with unspecified severity: Secondary | ICD-10-CM | POA: Diagnosis present

## 2020-03-19 DIAGNOSIS — I70209 Unspecified atherosclerosis of native arteries of extremities, unspecified extremity: Secondary | ICD-10-CM | POA: Diagnosis present

## 2020-03-19 DIAGNOSIS — E876 Hypokalemia: Secondary | ICD-10-CM

## 2020-03-19 DIAGNOSIS — G9340 Encephalopathy, unspecified: Secondary | ICD-10-CM | POA: Diagnosis present

## 2020-03-19 DIAGNOSIS — M869 Osteomyelitis, unspecified: Secondary | ICD-10-CM | POA: Diagnosis present

## 2020-03-19 DIAGNOSIS — F17290 Nicotine dependence, other tobacco product, uncomplicated: Secondary | ICD-10-CM | POA: Diagnosis present

## 2020-03-19 LAB — CBC WITH DIFFERENTIAL/PLATELET
Abs Immature Granulocytes: 0.23 10*3/uL — ABNORMAL HIGH (ref 0.00–0.07)
Basophils Absolute: 0.1 10*3/uL (ref 0.0–0.1)
Basophils Relative: 0 %
Eosinophils Absolute: 0 10*3/uL (ref 0.0–0.5)
Eosinophils Relative: 0 %
HCT: 34.4 % — ABNORMAL LOW (ref 39.0–52.0)
Hemoglobin: 11.3 g/dL — ABNORMAL LOW (ref 13.0–17.0)
Immature Granulocytes: 1 %
Lymphocytes Relative: 4 %
Lymphs Abs: 0.9 10*3/uL (ref 0.7–4.0)
MCH: 29 pg (ref 26.0–34.0)
MCHC: 32.8 g/dL (ref 30.0–36.0)
MCV: 88.2 fL (ref 80.0–100.0)
Monocytes Absolute: 0.9 10*3/uL (ref 0.1–1.0)
Monocytes Relative: 4 %
Neutro Abs: 19.1 10*3/uL — ABNORMAL HIGH (ref 1.7–7.7)
Neutrophils Relative %: 91 %
Platelets: 241 10*3/uL (ref 150–400)
RBC: 3.9 MIL/uL — ABNORMAL LOW (ref 4.22–5.81)
RDW: 16.6 % — ABNORMAL HIGH (ref 11.5–15.5)
WBC: 21.3 10*3/uL — ABNORMAL HIGH (ref 4.0–10.5)
nRBC: 0 % (ref 0.0–0.2)

## 2020-03-19 LAB — COMPREHENSIVE METABOLIC PANEL
ALT: 12 U/L (ref 0–44)
AST: 18 U/L (ref 15–41)
Albumin: 3.5 g/dL (ref 3.5–5.0)
Alkaline Phosphatase: 40 U/L (ref 38–126)
Anion gap: 11 (ref 5–15)
BUN: 30 mg/dL — ABNORMAL HIGH (ref 8–23)
CO2: 22 mmol/L (ref 22–32)
Calcium: 8.7 mg/dL — ABNORMAL LOW (ref 8.9–10.3)
Chloride: 103 mmol/L (ref 98–111)
Creatinine, Ser: 1.39 mg/dL — ABNORMAL HIGH (ref 0.61–1.24)
GFR calc Af Amer: 56 mL/min — ABNORMAL LOW (ref >60)
GFR calc non Af Amer: 49 mL/min — ABNORMAL LOW (ref >60)
Glucose, Bld: 111 mg/dL — ABNORMAL HIGH (ref 70–99)
Potassium: 3.9 mmol/L (ref 3.5–5.1)
Sodium: 136 mmol/L (ref 135–145)
Total Bilirubin: 1 mg/dL (ref 0.3–1.2)
Total Protein: 7.6 g/dL (ref 6.5–8.1)

## 2020-03-19 MED ORDER — SODIUM CHLORIDE 0.9 % IV BOLUS
500.0000 mL | Freq: Once | INTRAVENOUS | Status: AC
  Administered 2020-03-19: 500 mL via INTRAVENOUS

## 2020-03-19 MED ORDER — SODIUM CHLORIDE 0.9 % IV BOLUS
500.0000 mL | Freq: Once | INTRAVENOUS | Status: AC
  Administered 2020-03-20: 500 mL via INTRAVENOUS

## 2020-03-19 MED ORDER — SODIUM CHLORIDE 0.9 % IV SOLN
1.0000 g | Freq: Once | INTRAVENOUS | Status: AC
  Administered 2020-03-20: 1 g via INTRAVENOUS
  Filled 2020-03-19: qty 10

## 2020-03-19 NOTE — ED Triage Notes (Signed)
Pt BIB EMS and coming from home.  Initially pt was "not acting right" by family. [name] CBG on EMS arrival to home was 149 and pt is able to answer all question appropriately.  Pt reports generalized weakness x 2 days but no pain, vomiting, or diarrhea.  EMS noted some small remains of blood in toilet.  Pt doesn't see well so is unsure where the source of blood is from. Pt denies pain upon urination. EMS reports a temp of 100.2.  EMS gave pt 510ml of NaCl through an 18g L forearm. EMS reports that pt was able to take about 20 steps. Pt lives at him by himself.    EMS VS after fluids BP: 97/50 HR: 80 RR: 16 O2: 99% RA

## 2020-03-19 NOTE — ED Provider Notes (Addendum)
[facility] DEPT Provider Note   CSN: [id number] Arrival date & time: 03/19/20  2023     History Chief Complaint  Patient presents with  . Weakness    [name] is a 79 y.o. male.  Chief complaint weakness since earlier today..  Family said he was "not acting right".  Glucose 149 per EMS.  Patient reports blood in his urine.  No dysuria, cough, chest pain, fever, sweats, chills, flank pain.  Specifically, no pain at all.  Severity of symptoms is mild to moderate.  Nothing makes symptoms better or worse.        Past Medical History:  Diagnosis Date  . Arthritis   . Diabetic neuropathy ([place])   . High cholesterol   . Hypertension    denies  . Osteomyelitis (HCC)    Right great toe  . Peripheral vascular disease ([place])    diabetic  with osteomylitis  . 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 Type II diabetes mellitus [facility])     Patient Active Problem List   Diagnosis Date Noted  . Midfoot ulcer, left, limited to breakdown of skin ([place]) 12/29/2018  . Occipital stroke ([place]) 10/31/2018  . DM2 (diabetes mellitus, type 2) ([place]) 10/31/2018  . NSTEMI (non-ST elevated myocardial infarction) ([place]) 05/28/2018  . Syncope 05/28/2018  . HTN (hypertension) 05/28/2018  . Hyperlipemia, mixed 05/28/2018  . Rash and nonspecific skin eruption 12/16/2017  . Pain in right hand 09/11/2017  . Carpal tunnel syndrome, right upper limb 07/01/2017  . Arthritis of carpometacarpal [facility]) joint of left thumb 12/30/2016  . Midfoot skin ulcer, right, limited to breakdown of skin ([place]) 07/22/2016  . 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 Osteomyelitis of ankle or foot, left, acute ([place]) 08/23/2014  . Cellulitis 10/12/2012  . Chest pain 10/12/2012  . Tobacco abuse 10/12/2012  . ABSCESS, AXILLA, LEFT 02/23/2008  . 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 CHEST WALL PAIN, ACUTE 12/03/2007  . CANDIDIASIS, GLANS PENIS 09/16/2007  . HEADACHE 08/04/2007  .  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 Other and unspecified hyperlipidemia 03/05/2007  . GOUT 03/05/2007  . ERECTILE DYSFUNCTION 03/05/2007  . EXTERNAL HEMORRHOIDS 03/05/2007  . VENTRAL HERNIA 03/05/2007  . FATTY LIVER DISEASE 03/05/2007  . Osteoarthritis 03/05/2007  . HEMORRHOIDS, INTERNAL 03/17/2005  . COLONIC POLYPS, HX OF 03/17/2005    Past Surgical History:  Procedure Laterality Date  . AMPUTATION  07/23/2011   Procedure: AMPUTATION DIGIT;  Surgeon: Newt Minion, MD;  Location: [facility];  Service: Orthopedics;  Laterality: Right;  Right Great Toe Amputation  . AMPUTATION Right 09/18/2012   Procedure: Right Foot 2nd Ray Amputation;  Surgeon: Newt Minion, MD;  Location: [place];  Service: Orthopedics;  Laterality: Right;  Right Foot 2nd Ray Amputation  . AMPUTATION Right 10/14/2012   Procedure: AMPUTATION FOOT;  Surgeon: Newt Minion, MD;  Location: [facility];  Service: Orthopedics;  Laterality: Right;  Right Foot Transmetatarsal Amputation  . AMPUTATION Left 09/23/2014   Procedure: Left Foot Transmetatarsal Amputation;  Surgeon: Newt Minion, MD;  Location: [facility];  Service: Orthopedics;  Laterality: Left;  . BACK SURGERY     lower  . CARPAL TUNNEL RELEASE Right 01/30/2018   Procedure: RIGHT CARPAL TUNNEL RELEASE;  Surgeon: Newt Minion, MD;  Location: [facility];  Service: Orthopedics;  Laterality: Right;  . CIRCUMCISION  2010  . COLONOSCOPY    . FOOT  FRACTURE SURGERY Left 1970's   "broke it playing football" (10/12/2012)  . HUMERUS FRACTURE SURGERY W/ IMPLANT Right 1960's   "put a plate in it" (4/43/1540)  . INCISION AND DRAINAGE OF WOUND Left 2006   "foot" (10/12/2012)  . [place] SURGERY  2008       Family History  Problem Relation Age of Onset  . Diabetes Mellitus II Other   . Anesthesia problems Neg Hx     Social History   Tobacco Use  . Smoking status: Current Every Day Smoker    Years:  12.00    Types: Pipe  . Smokeless tobacco: Never Used  Vaping Use  . Vaping Use: Never used  Substance Use Topics  . Alcohol use: No    Alcohol/week: 0.0 standard drinks  . Drug use: No    Home Medications Prior to Admission medications   Medication Sig Start Date End Date Taking? Authorizing Provider  acetaminophen (TYLENOL) 650 MG CR tablet Take 650 mg by mouth every 8 (eight) hours as needed for pain.   Yes [provider]  atorvastatin (LIPITOR) 40 MG tablet Take 40 mg by mouth daily.   Yes [provider]  clopidogrel (PLAVIX) 75 MG tablet Take 1 tablet (75 mg total) by mouth daily. 11/01/18  Yes Mariel Aloe, MD  cycloSPORINE (RESTASIS) 0.05 % ophthalmic emulsion Place 1 drop into both eyes 2 (two) times daily.   Yes [provider]  Dorzolamide HCl-Timolol Mal PF 2-0.5 % SOLN Apply to eye.   Yes [provider]  gabapentin (NEURONTIN) 300 MG capsule Take 300 mg by mouth 2 (two) times daily.    Yes [provider]  latanoprost (XALATAN) 0.005 % ophthalmic solution Place 1 drop into both eyes at bedtime.  09/12/14  Yes [provider]  lisinopril (ZESTRIL) 2.5 MG tablet Take 2.5 mg by mouth daily.   Yes [provider]  metFORMIN (GLUCOPHAGE) 500 MG tablet Take 500 mg by mouth daily.   Yes [provider]  Multiple Vitamin (MULTIVITAMIN WITH MINERALS) TABS tablet Take 1 tablet by mouth daily. centrum   Yes [provider]  pregabalin (LYRICA) 50 MG capsule Take 1 capsule (50 mg total) by mouth 3 (three) times daily. Patient taking differently: Take 50 mg by mouth 2 (two) times daily.  02/09/20  Yes Suzan Slick, NP  traMADol (ULTRAM) 50 MG tablet Take 50 mg by mouth every 6 (six) hours as needed for moderate pain.   Yes [provider]  ALPHAGAN P 0.1 % SOLN Place 1 drop into both eyes 2 (two) times daily.  Patient not taking: Reported on 03/19/2020 12/31/16   [provider]  timolol  (TIMOPTIC) 0.5 % ophthalmic solution Place 1 drop into both eyes 2 (two) times daily. 05/19/17   [provider]    Allergies    Patient has no known allergies.  Review of Systems   Review of Systems  All other systems reviewed and are negative.   Physical Exam Updated Vital Signs BP (!) 102/55   Pulse 68   Temp 99.2 F (37.3 C) (Oral)   Resp 18   Ht 6\' 1"  (1.854 m)   Wt 81.6 kg   SpO2 100%   BMI 23.75 kg/m   Physical Exam Vitals and nursing note reviewed.  Constitutional:      Appearance: He is well-developed.     Comments: nad  HENT:     Head: Normocephalic and atraumatic.  Eyes:  Conjunctiva/sclera: Conjunctivae normal.  Cardiovascular:     Rate and Rhythm: Normal rate and regular rhythm.  Pulmonary:     Effort: Pulmonary effort is normal.     Breath sounds: Normal breath sounds.  Abdominal:     General: Bowel sounds are normal.     Palpations: Abdomen is soft.     Comments: No abdominal tenderness  Genitourinary:    Comments: No flank pain Musculoskeletal:        General: Normal range of motion.     Cervical back: Neck supple.  Skin:    General: Skin is warm and dry.  Neurological:     General: No focal deficit present.     Mental Status: He is alert and oriented to person, place, and time.  Psychiatric:     Comments: Flat affect     ED Results / Procedures / Treatments   Labs (all labs ordered are listed, but only abnormal results are displayed) Labs Reviewed  CBC WITH DIFFERENTIAL/PLATELET - Abnormal; Notable for the following components:      Result Value   WBC 21.3 (*)    RBC 3.90 (*)    Hemoglobin 11.3 (*)    HCT 34.4 (*)    RDW 16.6 (*)    Neutro Abs 19.1 (*)    Abs Immature Granulocytes 0.23 (*)    All other components within normal limits  COMPREHENSIVE METABOLIC PANEL - Abnormal; Notable for the following components:   Glucose, Bld 111 (*)    BUN 30 (*)    Creatinine, Ser 1.39 (*)    Calcium 8.7 (*)    GFR calc non Af  Amer 49 (*)    GFR calc Af Amer 56 (*)    All other components within normal limits  CULTURE, BLOOD (ROUTINE X 2)  CULTURE, BLOOD (ROUTINE X 2)  URINE CULTURE  URINALYSIS, ROUTINE W REFLEX MICROSCOPIC  LACTIC ACID, PLASMA  LACTIC ACID, PLASMA    EKG None  Radiology DG Chest Port 1 View  Result Date: 03/19/2020 CLINICAL DATA:  Generalized weakness x2 days. EXAM: PORTABLE CHEST 1 VIEW COMPARISON:  Nov 01, 2018 FINDINGS: The cardiac silhouette is mildly enlarged and unchanged in size. Both lungs are clear. Chronic and degenerative changes seen involving the right shoulder and thoracic spine. IMPRESSION: No active disease. Electronically Signed   By: Virgina Norfolk M.D.   On: 03/19/2020 22:33    Procedures Procedures (including critical care time)  Medications Ordered in ED Medications  cefTRIAXone (ROCEPHIN) 1 g in sodium chloride 0.9 % 100 mL IVPB (has no administration in time range)  sodium chloride 0.9 % bolus 500 mL (500 mLs Intravenous New Bag/Given 03/19/20 2155)    ED Course  I have reviewed the triage vital signs and the nursing notes.  Pertinent labs & imaging results that were available during my care of the patient were reviewed by me and considered in my medical decision making (see chart for details).    MDM Rules/Calculators/A&P                          Chief complaint weakness.  He appears in no acute distress.  Will obtain basic labs, urinalysis, chest x-ray.  White count elevated.  Suspect urinary infection.  Urine culture, blood culture, Rocephin 1 g IV, discussed with Dr. Stark Jock Final Clinical Impression(s) / ED Diagnoses Final diagnoses:  Weakness  Leukocytosis, unspecified type  Hypokalemia    Rx / DC Orders ED Discharge  Orders    None       Nat Christen, MD 03/19/20 2156    Nat Christen, MD 03/19/20 2312    Nat Christen, MD 03/19/20 [phone]

## 2020-03-20 ENCOUNTER — Inpatient Hospital Stay (HOSPITAL_COMMUNITY): Payer: Medicare Other

## 2020-03-20 ENCOUNTER — Observation Stay (HOSPITAL_COMMUNITY): Payer: Medicare Other

## 2020-03-20 ENCOUNTER — Other Ambulatory Visit: Payer: Self-pay

## 2020-03-20 ENCOUNTER — Encounter (HOSPITAL_COMMUNITY): Payer: Self-pay | Admitting: Internal Medicine

## 2020-03-20 DIAGNOSIS — I10 Essential (primary) hypertension: Secondary | ICD-10-CM | POA: Diagnosis present

## 2020-03-20 DIAGNOSIS — R6510 Systemic inflammatory response syndrome (SIRS) of non-infectious origin without acute organ dysfunction: Secondary | ICD-10-CM | POA: Diagnosis not present

## 2020-03-20 DIAGNOSIS — Z7902 Long term (current) use of antithrombotics/antiplatelets: Secondary | ICD-10-CM | POA: Diagnosis not present

## 2020-03-20 DIAGNOSIS — M86271 Subacute osteomyelitis, right ankle and foot: Secondary | ICD-10-CM | POA: Diagnosis not present

## 2020-03-20 DIAGNOSIS — E1140 Type 2 diabetes mellitus with diabetic neuropathy, unspecified: Secondary | ICD-10-CM | POA: Diagnosis present

## 2020-03-20 DIAGNOSIS — F17290 Nicotine dependence, other tobacco product, uncomplicated: Secondary | ICD-10-CM | POA: Diagnosis present

## 2020-03-20 DIAGNOSIS — N179 Acute kidney failure, unspecified: Secondary | ICD-10-CM | POA: Diagnosis present

## 2020-03-20 DIAGNOSIS — Z66 Do not resuscitate: Secondary | ICD-10-CM | POA: Diagnosis present

## 2020-03-20 DIAGNOSIS — G9340 Encephalopathy, unspecified: Secondary | ICD-10-CM | POA: Diagnosis present

## 2020-03-20 DIAGNOSIS — Z8673 Personal history of transient ischemic attack (TIA), and cerebral infarction without residual deficits: Secondary | ICD-10-CM | POA: Diagnosis not present

## 2020-03-20 DIAGNOSIS — Z20822 Contact with and (suspected) exposure to covid-19: Secondary | ICD-10-CM | POA: Diagnosis present

## 2020-03-20 DIAGNOSIS — A419 Sepsis, unspecified organism: Secondary | ICD-10-CM | POA: Diagnosis present

## 2020-03-20 DIAGNOSIS — L97529 Non-pressure chronic ulcer of other part of left foot with unspecified severity: Secondary | ICD-10-CM | POA: Diagnosis present

## 2020-03-20 DIAGNOSIS — E11621 Type 2 diabetes mellitus with foot ulcer: Secondary | ICD-10-CM | POA: Diagnosis present

## 2020-03-20 DIAGNOSIS — Z7984 Long term (current) use of oral hypoglycemic drugs: Secondary | ICD-10-CM | POA: Diagnosis not present

## 2020-03-20 DIAGNOSIS — Z89431 Acquired absence of right foot: Secondary | ICD-10-CM | POA: Diagnosis not present

## 2020-03-20 DIAGNOSIS — M86171 Other acute osteomyelitis, right ankle and foot: Secondary | ICD-10-CM | POA: Diagnosis present

## 2020-03-20 DIAGNOSIS — E1151 Type 2 diabetes mellitus with diabetic peripheral angiopathy without gangrene: Secondary | ICD-10-CM | POA: Diagnosis present

## 2020-03-20 DIAGNOSIS — M869 Osteomyelitis, unspecified: Secondary | ICD-10-CM | POA: Diagnosis not present

## 2020-03-20 DIAGNOSIS — Z89432 Acquired absence of left foot: Secondary | ICD-10-CM | POA: Diagnosis not present

## 2020-03-20 DIAGNOSIS — L97519 Non-pressure chronic ulcer of other part of right foot with unspecified severity: Secondary | ICD-10-CM | POA: Diagnosis present

## 2020-03-20 DIAGNOSIS — I70209 Unspecified atherosclerosis of native arteries of extremities, unspecified extremity: Secondary | ICD-10-CM | POA: Diagnosis not present

## 2020-03-20 DIAGNOSIS — Z79899 Other long term (current) drug therapy: Secondary | ICD-10-CM | POA: Diagnosis not present

## 2020-03-20 LAB — URINALYSIS, ROUTINE W REFLEX MICROSCOPIC
Bacteria, UA: NONE SEEN
Bilirubin Urine: NEGATIVE
Glucose, UA: NEGATIVE mg/dL
Ketones, ur: NEGATIVE mg/dL
Leukocytes,Ua: NEGATIVE
Nitrite: NEGATIVE
Protein, ur: NEGATIVE mg/dL
Specific Gravity, Urine: 1.02 (ref 1.005–1.030)
pH: 5 (ref 5.0–8.0)

## 2020-03-20 LAB — LACTIC ACID, PLASMA: Lactic Acid, Venous: 1.5 mmol/L (ref 0.5–1.9)

## 2020-03-20 LAB — CREATININE, SERUM
Creatinine, Ser: 1.36 mg/dL — ABNORMAL HIGH (ref 0.61–1.24)
GFR calc Af Amer: 58 mL/min — ABNORMAL LOW (ref >60)
GFR calc non Af Amer: 50 mL/min — ABNORMAL LOW (ref >60)

## 2020-03-20 LAB — AEROBIC CULTURE W GRAM STAIN (SUPERFICIAL SPECIMEN)

## 2020-03-20 LAB — CBC
HCT: 32.7 % — ABNORMAL LOW (ref 39.0–52.0)
Hemoglobin: 10.4 g/dL — ABNORMAL LOW (ref 13.0–17.0)
MCH: 28.4 pg (ref 26.0–34.0)
MCHC: 31.8 g/dL (ref 30.0–36.0)
MCV: 89.3 fL (ref 80.0–100.0)
Platelets: 215 10*3/uL (ref 150–400)
RBC: 3.66 MIL/uL — ABNORMAL LOW (ref 4.22–5.81)
RDW: 16.8 % — ABNORMAL HIGH (ref 11.5–15.5)
WBC: 15 10*3/uL — ABNORMAL HIGH (ref 4.0–10.5)
nRBC: 0 % (ref 0.0–0.2)

## 2020-03-20 LAB — GLUCOSE, CAPILLARY
Glucose-Capillary: 96 mg/dL (ref 70–99)
Glucose-Capillary: 96 mg/dL (ref 70–99)
Glucose-Capillary: 116 mg/dL — ABNORMAL HIGH (ref 70–99)

## 2020-03-20 LAB — RESPIRATORY PANEL BY RT PCR (FLU A&B, COVID)
Influenza A by PCR: NEGATIVE
Influenza B by PCR: NEGATIVE
SARS Coronavirus 2 by RT PCR: NEGATIVE

## 2020-03-20 LAB — TSH: TSH: 1.791 u[IU]/mL (ref 0.350–4.500)

## 2020-03-20 LAB — CK: Total CK: 288 U/L (ref 49–397)

## 2020-03-20 LAB — TROPONIN I (HIGH SENSITIVITY)
Troponin I (High Sensitivity): 194 ng/L (ref <18)
Troponin I (High Sensitivity): 194 ng/L (ref <18)

## 2020-03-20 LAB — SEDIMENTATION RATE: Sed Rate: 50 mm/hr — ABNORMAL HIGH (ref 0–16)

## 2020-03-20 MED ORDER — ACETAMINOPHEN 650 MG RE SUPP: 650.0000 mg | Freq: Four times a day (QID) | RECTAL | Status: DC | PRN

## 2020-03-20 MED ORDER — ACETAMINOPHEN 325 MG PO TABS
650.0000 mg | ORAL_TABLET | Freq: Four times a day (QID) | ORAL | Status: DC | PRN
  Administered 2020-03-20 – 2020-03-26 (×3): 650 mg via ORAL
  Filled 2020-03-20 (×3): qty 2

## 2020-03-20 MED ORDER — SODIUM CHLORIDE 0.9 % IV BOLUS
500.0000 mL | Freq: Once | INTRAVENOUS | Status: AC
  Administered 2020-03-20: 500 mL via INTRAVENOUS

## 2020-03-20 MED ORDER — ATORVASTATIN CALCIUM 40 MG PO TABS
40.0000 mg | ORAL_TABLET | Freq: Every day | ORAL | Status: DC
  Administered 2020-03-20 – 2020-03-28 (×8): 40 mg via ORAL
  Filled 2020-03-20 (×8): qty 1

## 2020-03-20 MED ORDER — ONDANSETRON HCL 4 MG/2ML IJ SOLN: 4.0000 mg | Freq: Four times a day (QID) | INTRAMUSCULAR | Status: DC | PRN

## 2020-03-20 MED ORDER — LATANOPROST 0.005 % OP SOLN
1.0000 [drp] | Freq: Every day | OPHTHALMIC | Status: DC
  Administered 2020-03-20 – 2020-03-27 (×8): 1 [drp] via OPHTHALMIC
  Filled 2020-03-20: qty 2.5

## 2020-03-20 MED ORDER — VANCOMYCIN HCL 1250 MG/250ML IV SOLN
1250.0000 mg | Freq: Once | INTRAVENOUS | Status: AC
  Administered 2020-03-20: 1250 mg via INTRAVENOUS
  Filled 2020-03-20: qty 250

## 2020-03-20 MED ORDER — TIMOLOL MALEATE 0.5 % OP SOLN
1.0000 [drp] | Freq: Two times a day (BID) | OPHTHALMIC | Status: DC
  Administered 2020-03-20 – 2020-03-28 (×16): 1 [drp] via OPHTHALMIC
  Filled 2020-03-20: qty 5

## 2020-03-20 MED ORDER — ONDANSETRON HCL 4 MG PO TABS: 4.0000 mg | ORAL_TABLET | Freq: Four times a day (QID) | ORAL | Status: DC | PRN

## 2020-03-20 MED ORDER — CYCLOSPORINE 0.05 % OP EMUL
1.0000 [drp] | Freq: Two times a day (BID) | OPHTHALMIC | Status: DC
  Administered 2020-03-20 – 2020-03-28 (×17): 1 [drp] via OPHTHALMIC
  Filled 2020-03-20 (×19): qty 1

## 2020-03-20 MED ORDER — INSULIN ASPART 100 UNIT/ML ~~LOC~~ SOLN
0.0000 [IU] | Freq: Three times a day (TID) | SUBCUTANEOUS | Status: DC
  Filled 2020-03-20: qty 0.09

## 2020-03-20 MED ORDER — SODIUM CHLORIDE 0.9 % IV SOLN: INTRAVENOUS | Status: AC

## 2020-03-20 MED ORDER — HEPARIN SODIUM (PORCINE) 5000 UNIT/ML IJ SOLN
5000.0000 [IU] | Freq: Three times a day (TID) | INTRAMUSCULAR | Status: DC
  Administered 2020-03-20 – 2020-03-28 (×22): 5000 [IU] via SUBCUTANEOUS
  Filled 2020-03-20 (×21): qty 1

## 2020-03-20 MED ORDER — TRAMADOL HCL 50 MG PO TABS
50.0000 mg | ORAL_TABLET | Freq: Four times a day (QID) | ORAL | Status: DC | PRN
  Administered 2020-03-23 – 2020-03-27 (×3): 50 mg via ORAL
  Filled 2020-03-20 (×4): qty 1

## 2020-03-20 MED ORDER — VANCOMYCIN HCL 1500 MG/300ML IV SOLN
1500.0000 mg | INTRAVENOUS | Status: AC
  Administered 2020-03-20 – 2020-03-25 (×6): 1500 mg via INTRAVENOUS
  Filled 2020-03-20 (×6): qty 300

## 2020-03-20 MED ORDER — PREGABALIN 25 MG PO CAPS
50.0000 mg | ORAL_CAPSULE | Freq: Two times a day (BID) | ORAL | Status: DC
  Administered 2020-03-20 – 2020-03-28 (×16): 50 mg via ORAL
  Filled 2020-03-20 (×2): qty 1
  Filled 2020-03-20 (×3): qty 2
  Filled 2020-03-20 (×3): qty 1
  Filled 2020-03-20: qty 2
  Filled 2020-03-20: qty 1
  Filled 2020-03-20 (×4): qty 2
  Filled 2020-03-20: qty 1
  Filled 2020-03-20: qty 2

## 2020-03-20 MED ORDER — GABAPENTIN 300 MG PO CAPS
300.0000 mg | ORAL_CAPSULE | Freq: Two times a day (BID) | ORAL | Status: DC
  Administered 2020-03-20 – 2020-03-28 (×17): 300 mg via ORAL
  Filled 2020-03-20 (×17): qty 1

## 2020-03-20 MED ORDER — CLOPIDOGREL BISULFATE 75 MG PO TABS
75.0000 mg | ORAL_TABLET | Freq: Every day | ORAL | Status: DC
  Administered 2020-03-20 – 2020-03-28 (×8): 75 mg via ORAL
  Filled 2020-03-20 (×9): qty 1

## 2020-03-20 NOTE — H&P (View-Only) (Signed)
ORTHOPAEDIC CONSULTATION  REQUESTING PHYSICIAN: Terrilee Croak, MD  Chief Complaint: Ulceration right foot medial column.  HPI: [name] is a 79 y.o. male who presents with diabetic insensate neuropathy who is status post a right transmetatarsal amputation in April 2014 and status post a left transmetatarsal amputation April 2016.  Past Medical History:  Diagnosis Date  . Arthritis   . Diabetic neuropathy ([place])   . High cholesterol   . Hypertension    denies  . Osteomyelitis (HCC)    Right great toe  . Peripheral vascular disease ([place])    diabetic  with osteomylitis  . 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 Type II diabetes mellitus ([place])    Past Surgical History:  Procedure Laterality Date  . AMPUTATION  07/23/2011   Procedure: AMPUTATION DIGIT;  Surgeon: Newt Minion, MD;  Location: [facility];  Service: Orthopedics;  Laterality: Right;  Right Great Toe Amputation  . AMPUTATION Right 09/18/2012   Procedure: Right Foot 2nd Ray Amputation;  Surgeon: Newt Minion, MD;  Location: [facility];  Service: Orthopedics;  Laterality: Right;  Right Foot 2nd Ray Amputation  . AMPUTATION Right 10/14/2012   Procedure: AMPUTATION FOOT;  Surgeon: Newt Minion, MD;  Location: [facility];  Service: Orthopedics;  Laterality: Right;  Right Foot Transmetatarsal Amputation  . AMPUTATION Left 09/23/2014   Procedure: Left Foot Transmetatarsal Amputation;  Surgeon: Newt Minion, MD;  Location: [facility];  Service: Orthopedics;  Laterality: Left;  . BACK SURGERY     lower  . CARPAL TUNNEL RELEASE Right 01/30/2018   Procedure: RIGHT CARPAL TUNNEL RELEASE;  Surgeon: Newt Minion, MD;  Location: [facility];  Service: Orthopedics;  Laterality: Right;  . CIRCUMCISION  2010  . COLONOSCOPY    . FOOT FRACTURE SURGERY Left 1970's   "broke it playing football" (10/12/2012)  . HUMERUS FRACTURE SURGERY W/ IMPLANT Right 1960's   "put a plate in it" (01/17/2121)  . INCISION AND DRAINAGE OF WOUND Left 2006   "foot" (10/12/2012)  .  LUMBAR [place] SURGERY  2008   Social History   Socioeconomic History  . Marital status: Single    Spouse name: Not on file  . Number of children: 1  . Years of education: 69  . Highest education level: Not on file  Occupational History  . Not on file  Tobacco Use  . Smoking status: Current Every Day Smoker    Years: 12.00    Types: Pipe  . Smokeless tobacco: Never Used  Vaping Use  . Vaping Use: Never used  Substance and Sexual Activity  . Alcohol use: No    Alcohol/week: 0.0 standard drinks  . Drug use: No  . Sexual activity: Not Currently  Other Topics Concern  . Not on file  Social History Narrative   Lives alone in an apartment on the first floor.  Has one daughter.  Retired Games developer.  Education: 12th grade.    Social Determinants of Health   Financial Resource Strain:   . Difficulty of Paying Living Expenses: Not on file  Food Insecurity:   . Worried About Charity fundraiser in the Last Year: Not on file  . Ran Out of Food in the Last Year: Not on file  Transportation Needs:   . Lack of Transportation (Medical): Not on file  . Lack of Transportation (Non-Medical): Not on file  Physical Activity:   . Days of Exercise per Week: Not on file  . Minutes of Exercise per  Session: Not on file  Stress:   . Feeling of Stress : Not on file  Social Connections:   . Frequency of Communication with Friends and Family: Not on file  . Frequency of Social Gatherings with Friends and Family: Not on file  . Attends Religious Services: Not on file  . Active Member of Clubs or Organizations: Not on file  . Attends Archivist Meetings: Not on file  . Marital Status: Not on file   Family History  Problem Relation Age of Onset  . Diabetes Mellitus II Other   . Anesthesia problems Neg Hx    - negative except otherwise stated in the family history section No Known Allergies Prior to Admission medications   Medication Sig Start Date End Date Taking? Authorizing  Provider  acetaminophen (TYLENOL) 650 MG CR tablet Take 650 mg by mouth every 8 (eight) hours as needed for pain.   Yes [provider]  atorvastatin (LIPITOR) 40 MG tablet Take 40 mg by mouth daily.   Yes [provider]  clopidogrel (PLAVIX) 75 MG tablet Take 1 tablet (75 mg total) by mouth daily. 11/01/18  Yes Mariel Aloe, MD  cycloSPORINE (RESTASIS) 0.05 % ophthalmic emulsion Place 1 drop into both eyes 2 (two) times daily.   Yes [provider]  Dorzolamide HCl-Timolol Mal PF 2-0.5 % SOLN Apply to eye.   Yes [provider]  gabapentin (NEURONTIN) 300 MG capsule Take 300 mg by mouth 2 (two) times daily.    Yes [provider]  latanoprost (XALATAN) 0.005 % ophthalmic solution Place 1 drop into both eyes at bedtime.  09/12/14  Yes [provider]  lisinopril (ZESTRIL) 2.5 MG tablet Take 2.5 mg by mouth daily.   Yes [provider]  metFORMIN (GLUCOPHAGE) 500 MG tablet Take 500 mg by mouth daily.   Yes [provider]  Multiple Vitamin (MULTIVITAMIN WITH MINERALS) TABS tablet Take 1 tablet by mouth daily. centrum   Yes [provider]  pregabalin (LYRICA) 50 MG capsule Take 1 capsule (50 mg total) by mouth 3 (three) times daily. Patient taking differently: Take 50 mg by mouth 2 (two) times daily.  02/09/20  Yes Dondra Prader R, NP  timolol (TIMOPTIC) 0.5 % ophthalmic solution Place 1 drop into both eyes 2 (two) times daily. 05/19/17  Yes [provider]  traMADol (ULTRAM) 50 MG tablet Take 50 mg by mouth every 6 (six) hours as needed for moderate pain.   Yes [provider]  ALPHAGAN P 0.1 % SOLN Place 1 drop into both eyes 2 (two) times daily.  Patient not taking: Reported on 03/19/2020 12/31/16   [provider]   MR BRAIN WO CONTRAST  Result Date: 03/20/2020 CLINICAL DATA:  Altered mental status EXAM: MRI HEAD WITHOUT CONTRAST TECHNIQUE: Multiplanar, multiecho pulse sequences of the  brain and surrounding structures were obtained without intravenous contrast. COMPARISON:  02/05/2020 FINDINGS: Brain: There is no acute infarction or intracranial hemorrhage. There are small cortical/subcortical infarcts of the right frontal and occipital lobes. Additional patchy T2 hyperintensity in the supratentorial white matter is nonspecific but probably reflects stable mild chronic microvascular ischemic changes. There is no intracranial mass, mass effect, or edema. There is no hydrocephalus or extra-axial fluid collection. Vascular: Major vessel flow voids at the skull base are preserved. Skull and upper cervical spine: Normal marrow signal is preserved. Sinuses/Orbits: Paranasal sinuses are aerated. Bilateral lens replacements. Other: Sella is unremarkable.  Mastoid air cells are clear. IMPRESSION: No  evidence of recent infarction, hemorrhage, or mass. Stable chronic findings detailed above. Electronically Signed   By: Macy Mis M.D.   On: 03/20/2020 07:58   MR FOOT RIGHT WO CONTRAST  Result Date: 03/20/2020 CLINICAL DATA:  Nonhealing ulceration EXAM: MRI OF THE RIGHT FOREFOOT WITHOUT CONTRAST TECHNIQUE: Multiplanar, multisequence MR imaging of the right forefoot was performed. No intravenous contrast was administered. COMPARISON:  X-ray 03/20/2020.  MRI 03/14/2020 FINDINGS: Status post first ray resection and proximal transmetatarsal amputation of the second-fifth rays. Ulceration is again noted along the plantar aspect of the residual forefoot. Diffuse bone marrow edema throughout the medial cuneiform with associated patchy low T1 marrow signal compatible with acute osteomyelitis. Small heterogeneously T2 hyperintense fluid collection adjacent to the plantar margin of the medial cuneiform measuring 1.5 x 0.5 x 0.9 cm (series 4, image 41). There is subtle patchy marrow edema within the intermediate cuneiform and second metatarsal base (series 6, image 13). Bone marrow signal intensity within the  third, fourth, and fifth metatarsal bases as well as the lateral cuneiform, navicular, and cuboid are within normal limits. The osseous structures of the ankle and hindfoot are intact. Chronic atrophy and fatty infiltration of the intrinsic foot musculature. Post amputation changes to the flexor and extensor tendons. No tenosynovitis. IMPRESSION: 1. Acute osteomyelitis of the medial cuneiform. 2. Subtle patchy marrow edema within the intermediate cuneiform and second metatarsal base may be reactive or represent early acute osteomyelitis. 3. Small fluid collection adjacent to the plantar margin of the medial cuneiform measuring 1.5 x 0.5 x 0.9 cm, which may reflect a small abscess. Electronically Signed   By: Davina Poke D.O.   On: 03/20/2020 08:28   DG Chest Port 1 View  Result Date: 03/19/2020 CLINICAL DATA:  Generalized weakness x2 days. EXAM: PORTABLE CHEST 1 VIEW COMPARISON:  Nov 01, 2018 FINDINGS: The cardiac silhouette is mildly enlarged and unchanged in size. Both lungs are clear. Chronic and degenerative changes seen involving the right shoulder and thoracic spine. IMPRESSION: No active disease. Electronically Signed   By: Virgina Norfolk M.D.   On: 03/19/2020 22:33   DG Foot Complete Left  Result Date: 03/20/2020 CLINICAL DATA:  Bilateral foot ulcer EXAM: LEFT FOOT - COMPLETE 3+ VIEW COMPARISON:  December 16, 2019 FINDINGS: The patient is status post transmetatarsal amputation. Again noted is advanced arthropathy within the midfoot with ankylosis. No focal areas of cortical destruction or periosteal reaction. Calcaneal enthesophyte at the Achilles insertion site. Mild skin thickening and subcutaneous edema seen on the plantar surface. IMPRESSION: No definite acute osseous abnormality. Skin thickening and subcutaneous edema on the plantar surface. Electronically Signed   By: Prudencio Pair M.D.   On: 03/20/2020 03:01   DG Foot Complete Right  Result Date: 03/20/2020 CLINICAL DATA:  Ulcers. EXAM:  RIGHT FOOT COMPLETE - 3+ VIEW COMPARISON:  December 16, 2018 FINDINGS: Again noted are changes related to a prior transmetatarsal amputation. There is increasing lucency within the remaining medial cuneiform. There is persistent lucency in the plantar calcaneus, unchanged from prior. Plantar ulcers are again noted. Vascular calcifications are again visualized. IMPRESSION: 1. Findings are again concerning for osteomyelitis involving the medial cuneiform. 2. Soft tissue ulcer is again noted. Electronically Signed   By: Constance Holster M.D.   On: 03/20/2020 03:02   - pertinent xrays, CT, MRI studies were reviewed and independently interpreted  Positive ROS: All other systems have been reviewed and were otherwise negative with the exception of those mentioned in the HPI and  as above.  Physical Exam: General: Alert, no acute distress Psychiatric: Patient is competent for consent with normal mood and affect Lymphatic: No axillary or cervical lymphadenopathy Cardiovascular: No pedal edema Respiratory: No cyanosis, no use of accessory musculature GI: No organomegaly, abdomen is soft and non-tender    Images:  @ENCIMAGES @  Labs:  Lab Results  Component Value Date   HGBA1C 5.4 11/01/2018   HGBA1C 5.2 05/29/2018   HGBA1C 5.4 01/30/2018   ESRSEDRATE 50 (H) 03/20/2020   ESRSEDRATE 50 (H) 03/17/2020   ESRSEDRATE 34 (H) 12/17/2017   CRP 0.8 03/17/2020   CRP 17.1 (H) 12/17/2017   CRP 1.8 07/28/2017   REPTSTATUS PENDING 03/19/2020   REPTSTATUS PENDING 03/19/2020   GRAMSTAIN  03/17/2020    FEW WBC PRESENT, PREDOMINANTLY PMN FEW GRAM POSITIVE COCCI    CULT  03/19/2020    NO GROWTH < 12 HOURS Performed at [facility], [place] [address] [place], [place] [address]    CULT  03/19/2020    NO GROWTH < 12 HOURS Performed at [facility] [address], [place], [place] [address]    LABORGA METHICILLIN RESISTANT STAPHYLOCOCCUS AUREUS 03/17/2020    Lab Results  Component Value Date    ALBUMIN 3.5 03/19/2020   ALBUMIN 3.7 02/05/2020   ALBUMIN 3.2 (L) 10/31/2018    Neurologic: Patient does not have protective sensation bilateral lower extremities.   MUSCULOSKELETAL:   Skin: Examination patient has a chronic draining ulcer beneath the medial cuneiform right foot.  He has a very short transmetatarsal amputation.  Review the radiographs show destructive bony changes of the medial cuneiform.  Review of the MRI scan shows osteomyelitis involving the entire medial cuneiform.  Patient does not have palpable pulses but ankle-brachial indices show triphasic circulation with calcified vessels.  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  Plan: Plan: I have recommended to the patient to proceed with a excision of the medial cuneiform.  Risk and benefits were discussed including risk of the wound not healing need for additional surgery.  Patient states he understands wishes to proceed at this time.    Will need to get patient transferred to [facility] for surgery this week.  Thank you for the consult and the opportunity to see [name], [place] [phone] 5:35 PM

## 2020-03-20 NOTE — Consult Note (Signed)
ORTHOPAEDIC CONSULTATION  REQUESTING PHYSICIAN: Terrilee Croak, MD  Chief Complaint: Ulceration right foot medial column.  HPI: [name] is a 79 y.o. male who presents with diabetic insensate neuropathy who is status post a right transmetatarsal amputation in April 2014 and status post a left transmetatarsal amputation April 2016.  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   Type II diabetes mellitus ([place])    Past Surgical History:  Procedure Laterality Date   AMPUTATION  07/23/2011   Procedure: AMPUTATION DIGIT;  Surgeon: Newt Minion, MD;  Location: [facility];  Service: Orthopedics;  Laterality: Right;  Right Great Toe Amputation   AMPUTATION Right 09/18/2012   Procedure: Right Foot 2nd Ray Amputation;  Surgeon: Newt Minion, MD;  Location: [facility];  Service: Orthopedics;  Laterality: Right;  Right Foot 2nd Ray Amputation   AMPUTATION Right 10/14/2012   Procedure: AMPUTATION FOOT;  Surgeon: Newt Minion, MD;  Location: [facility];  Service: Orthopedics;  Laterality: Right;  Right Foot Transmetatarsal Amputation   AMPUTATION Left 09/23/2014   Procedure: Left Foot Transmetatarsal Amputation;  Surgeon: Newt Minion, MD;  Location: [facility];  Service: Orthopedics;  Laterality: Left;   BACK SURGERY     lower   CARPAL TUNNEL RELEASE Right 01/30/2018   Procedure: RIGHT CARPAL TUNNEL RELEASE;  Surgeon: Newt Minion, MD;  Location: [facility];  Service: Orthopedics;  Laterality: Right;   CIRCUMCISION  2010   COLONOSCOPY     FOOT FRACTURE SURGERY Left 1970's   "broke it playing football" (10/12/2012)   [place] Right 1960's   "put a plate in it" (2/95/2841)   INCISION AND DRAINAGE OF WOUND Left 2006   "foot" (10/12/2012)    [place] SURGERY  2008   Social History   Socioeconomic History   Marital status: Single    Spouse name: Not on file   Number of children: 1   Years of education: 12   Highest education level: Not on file  Occupational History   Not on file  Tobacco Use   Smoking status: Current Every Day Smoker    Years: 12.00    Types: Pipe   Smokeless tobacco: Never Used  Scientific laboratory technician Use: Never used  Substance and Sexual Activity   Alcohol use: No    Alcohol/week: 0.0 standard drinks   Drug use: No   Sexual activity: Not Currently  Other Topics Concern   Not on file  Social History Narrative   Lives alone in an apartment on the first floor.  Has one daughter.  Retired Games developer.  Education: 12th grade.    Social Determinants of Health   Financial Resource Strain:    Difficulty of Paying Living Expenses: Not on file  Food Insecurity:    Worried About Charity fundraiser in the Last Year: Not on file   [employer] of Food in the Last Year: Not on file  Transportation Needs:    Lack of Transportation (Medical): Not on file   Lack of Transportation (Non-Medical): Not on file  Physical Activity:    Days of Exercise per Week: Not on file   Minutes of Exercise per  Session: Not on file  Stress:    Feeling of Stress : Not on file  Social Connections:    Frequency of Communication with Friends and Family: Not on file   Frequency of Social Gatherings with Friends and Family: Not on file   Attends Religious Services: Not on file   Active Member of Clubs or Organizations: Not on file   Attends Archivist Meetings: Not on file   Marital Status: Not on file   Family History  Problem Relation Age of Onset   Diabetes Mellitus II Other    Anesthesia problems Neg Hx    - negative except otherwise stated in the family history section No Known Allergies Prior to Admission medications   Medication Sig Start Date End Date Taking? Authorizing  Provider  acetaminophen (TYLENOL) 650 MG CR tablet Take 650 mg by mouth every 8 (eight) hours as needed for pain.   Yes [provider]  atorvastatin (LIPITOR) 40 MG tablet Take 40 mg by mouth daily.   Yes [provider]  clopidogrel (PLAVIX) 75 MG tablet Take 1 tablet (75 mg total) by mouth daily. 11/01/18  Yes Mariel Aloe, MD  cycloSPORINE (RESTASIS) 0.05 % ophthalmic emulsion Place 1 drop into both eyes 2 (two) times daily.   Yes [provider]  Dorzolamide HCl-Timolol Mal PF 2-0.5 % SOLN Apply to eye.   Yes [provider]  gabapentin (NEURONTIN) 300 MG capsule Take 300 mg by mouth 2 (two) times daily.    Yes [provider]  latanoprost (XALATAN) 0.005 % ophthalmic solution Place 1 drop into both eyes at bedtime.  09/12/14  Yes [provider]  lisinopril (ZESTRIL) 2.5 MG tablet Take 2.5 mg by mouth daily.   Yes [provider]  metFORMIN (GLUCOPHAGE) 500 MG tablet Take 500 mg by mouth daily.   Yes [provider]  Multiple Vitamin (MULTIVITAMIN WITH MINERALS) TABS tablet Take 1 tablet by mouth daily. centrum   Yes [provider]  pregabalin (LYRICA) 50 MG capsule Take 1 capsule (50 mg total) by mouth 3 (three) times daily. Patient taking differently: Take 50 mg by mouth 2 (two) times daily.  02/09/20  Yes Dondra Prader R, NP  timolol (TIMOPTIC) 0.5 % ophthalmic solution Place 1 drop into both eyes 2 (two) times daily. 05/19/17  Yes [provider]  traMADol (ULTRAM) 50 MG tablet Take 50 mg by mouth every 6 (six) hours as needed for moderate pain.   Yes [provider]  ALPHAGAN P 0.1 % SOLN Place 1 drop into both eyes 2 (two) times daily.  Patient not taking: Reported on 03/19/2020 12/31/16   [provider]   MR BRAIN WO CONTRAST  Result Date: 03/20/2020 CLINICAL DATA:  Altered mental status EXAM: MRI HEAD WITHOUT CONTRAST TECHNIQUE: Multiplanar, multiecho pulse sequences of the  brain and surrounding structures were obtained without intravenous contrast. COMPARISON:  02/05/2020 FINDINGS: Brain: There is no acute infarction or intracranial hemorrhage. There are small cortical/subcortical infarcts of the right frontal and occipital lobes. Additional patchy T2 hyperintensity in the supratentorial white matter is nonspecific but probably reflects stable mild chronic microvascular ischemic changes. There is no intracranial mass, mass effect, or edema. There is no hydrocephalus or extra-axial fluid collection. Vascular: Major vessel flow voids at the skull base are preserved. Skull and upper cervical spine: Normal marrow signal is preserved. Sinuses/Orbits: Paranasal sinuses are aerated. Bilateral lens replacements. Other: Sella is unremarkable.  Mastoid air cells are clear. IMPRESSION: No  evidence of recent infarction, hemorrhage, or mass. Stable chronic findings detailed above. Electronically Signed   By: Macy Mis M.D.   On: 03/20/2020 07:58   MR FOOT RIGHT WO CONTRAST  Result Date: 03/20/2020 CLINICAL DATA:  Nonhealing ulceration EXAM: MRI OF THE RIGHT FOREFOOT WITHOUT CONTRAST TECHNIQUE: Multiplanar, multisequence MR imaging of the right forefoot was performed. No intravenous contrast was administered. COMPARISON:  X-ray 03/20/2020.  MRI 03/14/2020 FINDINGS: Status post first ray resection and proximal transmetatarsal amputation of the second-fifth rays. Ulceration is again noted along the plantar aspect of the residual forefoot. Diffuse bone marrow edema throughout the medial cuneiform with associated patchy low T1 marrow signal compatible with acute osteomyelitis. Small heterogeneously T2 hyperintense fluid collection adjacent to the plantar margin of the medial cuneiform measuring 1.5 x 0.5 x 0.9 cm (series 4, image 41). There is subtle patchy marrow edema within the intermediate cuneiform and second metatarsal base (series 6, image 13). Bone marrow signal intensity within the  third, fourth, and fifth metatarsal bases as well as the lateral cuneiform, navicular, and cuboid are within normal limits. The osseous structures of the ankle and hindfoot are intact. Chronic atrophy and fatty infiltration of the intrinsic foot musculature. Post amputation changes to the flexor and extensor tendons. No tenosynovitis. IMPRESSION: 1. Acute osteomyelitis of the medial cuneiform. 2. Subtle patchy marrow edema within the intermediate cuneiform and second metatarsal base may be reactive or represent early acute osteomyelitis. 3. Small fluid collection adjacent to the plantar margin of the medial cuneiform measuring 1.5 x 0.5 x 0.9 cm, which may reflect a small abscess. Electronically Signed   By: Davina Poke D.O.   On: 03/20/2020 08:28   DG Chest Port 1 View  Result Date: 03/19/2020 CLINICAL DATA:  Generalized weakness x2 days. EXAM: PORTABLE CHEST 1 VIEW COMPARISON:  Nov 01, 2018 FINDINGS: The cardiac silhouette is mildly enlarged and unchanged in size. Both lungs are clear. Chronic and degenerative changes seen involving the right shoulder and thoracic spine. IMPRESSION: No active disease. Electronically Signed   By: Virgina Norfolk M.D.   On: 03/19/2020 22:33   DG Foot Complete Left  Result Date: 03/20/2020 CLINICAL DATA:  Bilateral foot ulcer EXAM: LEFT FOOT - COMPLETE 3+ VIEW COMPARISON:  December 16, 2019 FINDINGS: The patient is status post transmetatarsal amputation. Again noted is advanced arthropathy within the midfoot with ankylosis. No focal areas of cortical destruction or periosteal reaction. Calcaneal enthesophyte at the Achilles insertion site. Mild skin thickening and subcutaneous edema seen on the plantar surface. IMPRESSION: No definite acute osseous abnormality. Skin thickening and subcutaneous edema on the plantar surface. Electronically Signed   By: Prudencio Pair M.D.   On: 03/20/2020 03:01   DG Foot Complete Right  Result Date: 03/20/2020 CLINICAL DATA:  Ulcers. EXAM:  RIGHT FOOT COMPLETE - 3+ VIEW COMPARISON:  December 16, 2018 FINDINGS: Again noted are changes related to a prior transmetatarsal amputation. There is increasing lucency within the remaining medial cuneiform. There is persistent lucency in the plantar calcaneus, unchanged from prior. Plantar ulcers are again noted. Vascular calcifications are again visualized. IMPRESSION: 1. Findings are again concerning for osteomyelitis involving the medial cuneiform. 2. Soft tissue ulcer is again noted. Electronically Signed   By: Constance Holster M.D.   On: 03/20/2020 03:02   - pertinent xrays, CT, MRI studies were reviewed and independently interpreted  Positive ROS: All other systems have been reviewed and were otherwise negative with the exception of those mentioned in the HPI and  as above.  Physical Exam: General: Alert, no acute distress Psychiatric: Patient is competent for consent with normal mood and affect Lymphatic: No axillary or cervical lymphadenopathy Cardiovascular: No pedal edema Respiratory: No cyanosis, no use of accessory musculature GI: No organomegaly, abdomen is soft and non-tender    Images:  @ENCIMAGES @  Labs:  Lab Results  Component Value Date   HGBA1C 5.4 11/01/2018   HGBA1C 5.2 05/29/2018   HGBA1C 5.4 01/30/2018   ESRSEDRATE 50 (H) 03/20/2020   ESRSEDRATE 50 (H) 03/17/2020   ESRSEDRATE 34 (H) 12/17/2017   CRP 0.8 03/17/2020   CRP 17.1 (H) 12/17/2017   CRP 1.8 07/28/2017   REPTSTATUS PENDING 03/19/2020   REPTSTATUS PENDING 03/19/2020   GRAMSTAIN  03/17/2020    FEW WBC PRESENT, PREDOMINANTLY PMN FEW GRAM POSITIVE COCCI    CULT  03/19/2020    NO GROWTH < 12 HOURS Performed at [facility], [place] [address], [place], [place] [address]    CULT  03/19/2020    NO GROWTH < 12 HOURS Performed at [facility] [address] [place], [place] [address]    LABORGA METHICILLIN RESISTANT STAPHYLOCOCCUS AUREUS 03/17/2020    Lab Results  Component Value Date    ALBUMIN 3.5 03/19/2020   ALBUMIN 3.7 02/05/2020   ALBUMIN 3.2 (L) 10/31/2018    Neurologic: Patient does not have protective sensation bilateral lower extremities.   MUSCULOSKELETAL:   Skin: Examination patient has a chronic draining ulcer beneath the medial cuneiform right foot.  He has a very short transmetatarsal amputation.  Review the radiographs show destructive bony changes of the medial cuneiform.  Review of the MRI scan shows osteomyelitis involving the entire medial cuneiform.  Patient does not have palpable pulses but ankle-brachial indices show triphasic circulation with calcified vessels.  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  Plan: Plan: I have recommended to the patient to proceed with a excision of the medial cuneiform.  Risk and benefits were discussed including risk of the wound not healing need for additional surgery.  Patient states he understands wishes to proceed at this time.    Will need to get patient transferred to [facility] for surgery this week.  Thank you for the consult and the opportunity to see Mr. [name], [place] [phone] 5:35 PM

## 2020-03-20 NOTE — Progress Notes (Addendum)
Received patient from ED, alert and oriented, VS obtained, telemetry monitor applied, has open areas to soles of bilateral feet with healing ulcers in multiple stages, oriented to unit, call light placed with in reach

## 2020-03-20 NOTE — Progress Notes (Signed)
PROGRESS NOTE  [name]  DOB: 18-Apr-1941  PCP: [name], MD OMV:[id number]  DOA: 03/19/2020  LOS: 0 days   Chief Complaint  Patient presents with  . Weakness   Brief narrative: [name] is a 79 y.o. male with history of previous stroke, hypertension, diabetes mellitus, bilateral transmetatarsal amputation of both feet. Patient was brought to ED on 10/3 from home by her niece after he was found to be increasingly weak for 24 hours  In the ED, patient's blood pressure was in the low normal range. WC count elevated 21.3, lactic acid level normal. Urinalysis and chest x-ray unremarkable.  Covid PCR negative. He was noted to have open wounds on the sole of both partially amputated feet.   X-ray of the right foot showed findings concerning for osteomyelitis of the medial cuneiform bone.   MRI right foot showed acute osteomyelitis of the medial cuneiform bone.  Patient was admitted to hospitalist service.  Subjective: Patient was seen and examined this morning. Pleasant elderly African-American male.  Lying in bed.  Not in distress. Consultation called with Dr. Sharol Given  Assessment/Plan: Acute osteomyelitis right medial cuneiform bone -Presented with worsening weakness, leukocytosis. -Did not meet criteria for SIRS on admission. -MRI of right foot showed acute osteomyelitis. -IV Rocephin was given in the ED. -Start IV vancomycin. -Dr. Sharol Given to see the patient later.  Generalized weakness  -could be from possible developing infection. -MRI brain unremarkable -PT/OT eval.  Acute renal failure  -Creatinine remains elevated.  Continue to monitor on IV fluid. -Lisinopril and Metformin on hold. Recent Labs    02/05/20 1437 03/19/20 2157 03/20/20 1002  BUN 9 30*  --   CREATININE 1.19 1.39* 1.36*   Diabetes mellitus type 2 -A1c 5.4 on May 2020.  Repeat A1c. -Continue to trend glucose and sliding scale with Accu-Cheks. -Metformin on hold. -Continue  Neurontin twice daily for neuropathy.  History of stroke  -Continue Plavix, statin.  Mobility: Needs PT eval Code Status:   Code Status: DNR  Nutritional status: Body mass index is 23.75 kg/m.     Diet Order            Diet heart healthy/carb modified Room service appropriate? Yes; Fluid consistency: Thin  Diet effective now                 DVT prophylaxis: heparin injection 5,000 Units Start: 03/20/20 0700   Antimicrobials:  IV vancomycin Fluid: 165ml/hr normal saline Consultants: Dr. Sharol Given Family Communication:  None  Status is: Inpatient  Remains inpatient appropriate because:Ongoing active pain requiring inpatient pain management and Ongoing diagnostic testing needed not appropriate for outpatient work up   Dispo: The patient is from: Home              Anticipated d/c is to: Home              Anticipated d/c date is: 3 days              Patient currently is not medically stable to d/c.       Infusions:  . sodium chloride 100 mL/hr at 03/20/20 1134    Scheduled Meds: . atorvastatin  40 mg Oral Daily  . clopidogrel  75 mg Oral Q breakfast  . cycloSPORINE  1 drop Both Eyes BID  . gabapentin  300 mg Oral BID  . heparin  5,000 Units Subcutaneous Q8H  . insulin aspart  0-9 Units Subcutaneous TID WC  . latanoprost  1 drop Both Eyes QHS  .  pregabalin  50 mg Oral BID  . timolol  1 drop Both Eyes BID    Antimicrobials: Anti-infectives (From admission, onward)   Start     Dose/Rate Route Frequency Ordered Stop   03/19/20 2315  cefTRIAXone (ROCEPHIN) 1 g in sodium chloride 0.9 % 100 mL IVPB        1 g 200 mL/hr over 30 Minutes Intravenous  Once 03/19/20 2308 03/20/20 0049      PRN meds: acetaminophen **OR** acetaminophen, ondansetron **OR** ondansetron (ZOFRAN) IV, traMADol   Objective: Vitals:   03/20/20 0918 03/20/20 1242  BP: (!) 107/59 106/65  Pulse: 71 68  Resp: (!) 21 17  Temp: 98.6 F (37 C)   SpO2: 99% 97%    Intake/Output Summary (Last  24 hours) at 03/20/2020 1459 Last data filed at 03/20/2020 1437 Gross per 24 hour  Intake 1400 ml  Output --  Net 1400 ml   Filed Weights   03/19/20 2041  Weight: 81.6 kg   Weight change:  Body mass index is 23.75 kg/m.   Physical Exam: General exam: Appears calm and comfortable.  Not in physical distress Skin: No rashes, lesions or ulcers. HEENT: Atraumatic, normocephalic, supple neck, no obvious bleeding Lungs: Clear to auscultation bilaterally CVS: Regular rate and rhythm, no murmur GI/Abd soft, nontender, nondistended, bowel sound present CNS: Alert, awake, oriented x3 Psychiatry: Mood appropriate Extremities: No pedal edema, no calf redness, bilateral foot carcinoma continue status.  There is a wound on the bottom of the right foot.  Data Review: I have personally reviewed the laboratory data and studies available.  Recent Labs  Lab 03/17/20 0953 03/19/20 2157 03/20/20 1002  WBC 6.0 21.3* 15.0*  NEUTROABS 3.0 19.1*  --   HGB 12.4* 11.3* 10.4*  HCT 39.1 34.4* 32.7*  MCV 90.9 88.2 89.3  PLT 315 241 215   Recent Labs  Lab 03/19/20 2157 03/20/20 1002  NA 136  --   K 3.9  --   CL 103  --   CO2 22  --   GLUCOSE 111*  --   BUN 30*  --   CREATININE 1.39* 1.36*  CALCIUM 8.7*  --     F/u labs ordered.  Signed, Terrilee Croak, MD [facility] 03/20/2020

## 2020-03-20 NOTE — Progress Notes (Signed)
Pharmacy Antibiotic Note  [name] is a 79 y.o. male with PMH bilat TMA admitted on 03/19/2020 with weakness. Open wounds on soles of both feet; Xray concerning for osteomyelitis, confirmed by MRI.  Pharmacy has been consulted for vancomycin dosing. Baseline SCr appears to be 1.0-1.5.  Plan:  Vancomycin 1250 mg IV now, then 1500 mg IV q24 hr (est AUC 542 based on SCr 1.36; Vd 0.72)  Measure vancomycin AUC at steady state as indicated  SCr q48 while on vanc   Height: 6\' 1"  (185.4 cm) Weight: 81.6 kg (180 lb) IBW/kg (Calculated) : 79.9  Temp (24hrs), Avg:98.9 F (37.2 C), Min:98.6 F (37 C), Max:99.2 F (37.3 C)  Recent Labs  Lab 03/17/20 0953 03/19/20 2157 03/19/20 2359 03/20/20 1002  WBC 6.0 21.3*  --  15.0*  CREATININE  --  1.39*  --  1.36*  LATICACIDVEN  --   --  1.5  --     Estimated Creatinine Clearance: 51.4 mL/min (A) (by C-G formula based on SCr of 1.36 mg/dL (H)).    No Known Allergies   Thank you for allowing pharmacy to be a part of this patient's care.  Takeysha Bonk A 03/20/2020 3:11 PM

## 2020-03-20 NOTE — ED Notes (Signed)
Report given to RN Joelene Millin

## 2020-03-20 NOTE — H&P (Signed)
History and Physical    [name] LKG:[id number] DOB: [birth year] DOA: 03/19/2020  PCP: [name], MD  Patient coming from: Home.  Chief Complaint: Weakness.  HPI: [name] is a 79 y.o. male with history of previous stroke, hypertension, diabetes mellitus, bilateral transmetatarsal amputation of both feet was found to be increasingly weak by patient's niece since yesterday morning.  Patient had an episode of nausea vomiting the previous night.  Since then patient has been weak but denies any chest pain shortness of breath.  Had some diaphoresis.  Since patient remained weak patient was brought to the ER.  ED Course: In the ER patient blood pressures in the low normal patient is alert and awake oriented time place and person moves all extremities.  Labs are significant for leukocytosis of 21.3 hemoglobin is 11.3 lactic acid was normal.  Creatinine is increased from 1.12 months ago and is around 1.3.  UA chest x-ray unremarkable Covid test was negative.  Given the wounds on the both extremities and leukocytosis x-rays were done there was some concerning features for osteomyelitis of the right foot.  Patient admitted for SIRS with possible infection or developing sepsis source could be possible osteomyelitis.  Review of Systems: As per HPI, rest all negative.   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[place])    Right great toe   Peripheral vascular disease ([place])    diabetic  with osteomylitis   Stroke (cerebrum) ([place]) 10/2018   Type II diabetes mellitus ([place])     Past Surgical History:  Procedure Laterality Date   AMPUTATION  07/23/2011   Procedure: AMPUTATION DIGIT;  Surgeon: Newt Minion, MD;  Location: [facility];  Service: Orthopedics;  Laterality: Right;  Right Great Toe Amputation   AMPUTATION Right 09/18/2012   Procedure: Right Foot 2nd Ray Amputation;   Surgeon: Newt Minion, MD;  Location: [facility];  Service: Orthopedics;  Laterality: Right;  Right Foot 2nd Ray Amputation   AMPUTATION Right 10/14/2012   Procedure: AMPUTATION FOOT;  Surgeon: Newt Minion, MD;  Location: [facility];  Service: Orthopedics;  Laterality: Right;  Right Foot Transmetatarsal Amputation   AMPUTATION Left 09/23/2014   Procedure: Left Foot Transmetatarsal Amputation;  Surgeon: Newt Minion, MD;  Location: [facility];  Service: Orthopedics;  Laterality: Left;   BACK SURGERY     lower   CARPAL TUNNEL RELEASE Right 01/30/2018   Procedure: RIGHT CARPAL TUNNEL RELEASE;  Surgeon: Newt Minion, MD;  Location: [facility];  Service: Orthopedics;  Laterality: Right;   CIRCUMCISION  2010   COLONOSCOPY     FOOT FRACTURE SURGERY Left 1970's   "broke it playing football" (10/12/2012)   [place] Right 1960's   "put a plate in it" (6/64/[birth year]34)   INCISION AND DRAINAGE OF WOUND Left 2006   "foot" (10/12/2012)   [place] SURGERY  2008     reports that he has been smoking pipe. He has smoked for the past 12.00 years. He has never used smokeless tobacco. He reports that he does not drink alcohol and does not use drugs.  No Known Allergies  Family History  Problem Relation Age of Onset   Diabetes Mellitus II Other    Anesthesia problems Neg Hx     Prior to Admission medications   Medication Sig Start Date End Date Taking?  Authorizing Provider  acetaminophen (TYLENOL) 650 MG CR tablet Take 650 mg by mouth every 8 (eight) hours as needed for pain.   Yes [provider]  atorvastatin (LIPITOR) 40 MG tablet Take 40 mg by mouth daily.   Yes [provider]  clopidogrel (PLAVIX) 75 MG tablet Take 1 tablet (75 mg total) by mouth daily. 11/01/18  Yes Mariel Aloe, MD  cycloSPORINE (RESTASIS) 0.05 % ophthalmic emulsion Place 1 drop into both eyes 2 (two) times daily.   Yes [provider]  Dorzolamide HCl-Timolol Mal PF 2-0.5 % SOLN  Apply to eye.   Yes [provider]  gabapentin (NEURONTIN) 300 MG capsule Take 300 mg by mouth 2 (two) times daily.    Yes [provider]  latanoprost (XALATAN) 0.005 % ophthalmic solution Place 1 drop into both eyes at bedtime.  09/12/14  Yes [provider]  lisinopril (ZESTRIL) 2.5 MG tablet Take 2.5 mg by mouth daily.   Yes [provider]  metFORMIN (GLUCOPHAGE) 500 MG tablet Take 500 mg by mouth daily.   Yes [provider]  Multiple Vitamin (MULTIVITAMIN WITH MINERALS) TABS tablet Take 1 tablet by mouth daily. centrum   Yes [provider]  pregabalin (LYRICA) 50 MG capsule Take 1 capsule (50 mg total) by mouth 3 (three) times daily. Patient taking differently: Take 50 mg by mouth 2 (two) times daily.  02/09/20  Yes Suzan Slick, NP  traMADol (ULTRAM) 50 MG tablet Take 50 mg by mouth every 6 (six) hours as needed for moderate pain.   Yes [provider]  ALPHAGAN P 0.1 % SOLN Place 1 drop into both eyes 2 (two) times daily.  Patient not taking: Reported on 03/19/2020 12/31/16   [provider]  timolol (TIMOPTIC) 0.5 % ophthalmic solution Place 1 drop into both eyes 2 (two) times daily. 05/19/17   [provider]    Physical Exam: Constitutional: Moderately built and nourished. Vitals:   03/20/20 0515 03/20/20 0530 03/20/20 0545 03/20/20 0600  BP: (!) 108/53 (!) 102/51 (!) 105/57 (!) 98/58  Pulse: 76 73 72 73  Resp:      Temp:      TempSrc:      SpO2: 100% 100% 99% 100%  Weight:      Height:       Eyes: Anicteric no pallor. ENMT: No discharge from the ears eyes nose or mouth. Neck: No mass felt.  No neck rigidity. Respiratory: No rhonchi or crepitations. Cardiovascular: S1-S2 heard. Abdomen: Soft nontender bowel sounds present. Musculoskeletal: Bilateral transmetatarsal amputation with some ulcers found on the soles.  No active discharge. Skin: Ulceration of both stumps of the feet. Neurologic:  Alert awake oriented to name and place moves all extremities. Psychiatric: Oriented to name and place.   Labs on Admission: I have personally reviewed following labs and imaging studies  CBC: Recent Labs  Lab 03/17/20 0953 03/19/20 2157  WBC 6.0 21.3*  NEUTROABS 3.0 19.1*  HGB 12.4* 11.3*  HCT 39.1 34.4*  MCV 90.9 88.2  PLT 315 229   Basic Metabolic Panel: Recent Labs  Lab 03/19/20 2157  NA 136  K 3.9  CL 103  CO2 22  GLUCOSE 111*  BUN 30*  CREATININE 1.39*  CALCIUM 8.7*   GFR: Estimated Creatinine Clearance: 50.3 mL/min (A) (by C-G formula based on SCr of 1.39 mg/dL (H)). Liver Function Tests: Recent Labs  Lab 03/19/20 2157  AST 18  ALT 12  ALKPHOS 40  BILITOT  1.0  PROT 7.6  ALBUMIN 3.5   No results for input(s): LIPASE, AMYLASE in the last 168 hours. No results for input(s): AMMONIA in the last 168 hours. Coagulation Profile: No results for input(s): INR, PROTIME in the last 168 hours. Cardiac Enzymes: No results for input(s): CKTOTAL, CKMB, CKMBINDEX, TROPONINI in the last 168 hours. BNP (last 3 results) No results for input(s): PROBNP in the last 8760 hours. HbA1C: No results for input(s): HGBA1C in the last 72 hours. CBG: No results for input(s): GLUCAP in the last 168 hours. Lipid Profile: No results for input(s): CHOL, HDL, LDLCALC, TRIG, CHOLHDL, LDLDIRECT in the last 72 hours. Thyroid Function Tests: No results for input(s): TSH, T4TOTAL, FREET4, T3FREE, THYROIDAB in the last 72 hours. Anemia Panel: No results for input(s): VITAMINB12, FOLATE, FERRITIN, TIBC, IRON, RETICCTPCT in the last 72 hours. Urine analysis:    Component Value Date/Time   COLORURINE YELLOW 03/19/2020 [place] 03/19/2020 2359   LABSPEC 1.020 03/19/2020 2359   PHURINE 5.0 03/19/2020 2359   GLUCOSEU NEGATIVE 03/19/2020 2359   HGBUR SMALL (A) 03/19/2020 2359   HGBUR trace-intact 09/16/2007 1115   BILIRUBINUR NEGATIVE 03/19/2020 2359   KETONESUR  NEGATIVE 03/19/2020 2359   PROTEINUR NEGATIVE 03/19/2020 2359   UROBILINOGEN 1.0 10/24/2013 1220   NITRITE NEGATIVE 03/19/2020 2359   LEUKOCYTESUR NEGATIVE 03/19/2020 2359   Sepsis Labs: @LABRCNTIP (procalcitonin:4,lacticidven:4) ) Recent Results (from the past 240 hour(s))  Aerobic Culture (superficial specimen)     Status: None (Preliminary result)   Collection Time: 03/17/20  8:00 AM   Specimen: Heel  Result Value Ref Range Status   Specimen Description   Final    HEEL LEFT Performed at [facility], [place] [address], [place], [place] [address]    Special Requests   Final    NONE Performed at [facility], [place] [address] [place], [place] [address]    Gram Stain   Final    FEW WBC PRESENT, PREDOMINANTLY PMN FEW GRAM POSITIVE COCCI Performed at [facility], [place] [address], [place], [place] [address]    Culture MODERATE STAPHYLOCOCCUS AUREUS  Final   Report Status PENDING  Incomplete  Respiratory Panel by RT PCR (Flu A&B, Covid) - Nasopharyngeal Swab     Status: None   Collection Time: 03/20/20  4:24 AM   Specimen: Nasopharyngeal Swab  Result Value Ref Range Status   SARS Coronavirus 2 by RT PCR NEGATIVE NEGATIVE Final    Comment: (NOTE) SARS-CoV-2 target nucleic acids are NOT DETECTED.  The SARS-CoV-2 RNA is generally detectable in upper respiratoy specimens during the acute phase of infection. The lowest concentration of SARS-CoV-2 viral copies this assay can detect is 131 copies/mL. A negative result does not preclude SARS-Cov-2 infection and should not be used as the sole basis for treatment or other patient management decisions. A negative result may occur with  improper specimen collection/handling, submission of specimen other than nasopharyngeal swab, presence of viral mutation(s) within the areas targeted by this assay, and inadequate number of viral copies (<131 copies/mL). A negative result must be combined with  clinical observations, patient history, and epidemiological information. The expected result is Negative.  Fact Sheet for Patients:  PinkCheek.be  Fact Sheet for Healthcare Providers:  GravelBags.it  This test is no t yet approved or cleared by the [place] FDA and  has been authorized for detection and/or diagnosis of SARS-CoV-2 by FDA under an Emergency Use Authorization (EUA). This EUA will remain  in effect (meaning this test can be used) for the duration of the COVID-19 declaration under Section 564(b)(1) of the Act, 21 U.S.C. section 360bbb-3(b)(1), unless the authorization is terminated or revoked sooner.     Influenza A by PCR NEGATIVE NEGATIVE Final   Influenza B by PCR NEGATIVE NEGATIVE Final    Comment: (NOTE) The Xpert Xpress SARS-CoV-2/FLU/RSV assay is intended as an aid in  the diagnosis of influenza from Nasopharyngeal swab specimens and  should not be used as a sole basis for treatment. Nasal washings and  aspirates are unacceptable for Xpert Xpress SARS-CoV-2/FLU/RSV  testing.  Fact Sheet for Patients: PinkCheek.be  Fact Sheet for Healthcare Providers: GravelBags.it  This test is not yet approved or cleared by the [place] FDA and  has been authorized for detection and/or diagnosis of SARS-CoV-2 by  FDA under an Emergency Use Authorization (EUA). This EUA will remain  in effect (meaning this test can be used) for the duration of the  Covid-19 declaration under Section 564(b)(1) of the Act, 21  U.S.C. section 360bbb-3(b)(1), unless the authorization is  terminated or revoked. Performed at [facility], [place] [address] [place], [place] [address]      Radiological Exams on Admission: DG Chest Port 1 View  Result Date: 03/19/2020 CLINICAL DATA:  Generalized weakness x2 days. EXAM: PORTABLE CHEST 1 VIEW COMPARISON:   Nov 01, 2018 FINDINGS: The cardiac silhouette is mildly enlarged and unchanged in size. Both lungs are clear. Chronic and degenerative changes seen involving the right shoulder and thoracic spine. IMPRESSION: No active disease. Electronically Signed   By: Virgina Norfolk M.D.   On: 03/19/2020 22:33   DG Foot Complete Left  Result Date: 03/20/2020 CLINICAL DATA:  Bilateral foot ulcer EXAM: LEFT FOOT - COMPLETE 3+ VIEW COMPARISON:  December 16, 2019 FINDINGS: The patient is status post transmetatarsal amputation. Again noted is advanced arthropathy within the midfoot with ankylosis. No focal areas of cortical destruction or periosteal reaction. Calcaneal enthesophyte at the Achilles insertion site. Mild skin thickening and subcutaneous edema seen on the plantar surface. IMPRESSION: No definite acute osseous abnormality. Skin thickening and subcutaneous edema on the plantar surface. Electronically Signed   By: Prudencio Pair M.D.   On: 03/20/2020 03:01   DG Foot Complete Right  Result Date: 03/20/2020 CLINICAL DATA:  Ulcers. EXAM: RIGHT FOOT COMPLETE - 3+ VIEW COMPARISON:  December 16, 2018 FINDINGS: Again noted are changes related to a prior transmetatarsal amputation. There is increasing lucency within the remaining medial cuneiform. There is persistent lucency in the plantar calcaneus, unchanged from prior. Plantar ulcers are again noted. Vascular calcifications are again visualized. IMPRESSION: 1. Findings are again concerning for osteomyelitis involving the medial cuneiform. 2. Soft tissue ulcer is again noted. Electronically Signed   By: Constance Holster M.D.   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 SIRS with possible having sepsis  source could be osteomyelitis for which I have started empiric antibiotics will get MRI of the right feet.  Follow cultures continue hydration hold antihypertensives. 2. Generalized weakness could be from possible developing infection.  However since patient has finding very difficult to ambulate with a previous history of stroke MRI brain has been ordered. 3.  Acute renal failure likely from dehydration.  Hold lisinopril hydrate and follow metabolic panel. 4. Anemia appears to be new follow CBC. 5. History of diabetes mellitus type 2 we will keep patient on sliding scale coverage. 6. Previous history of stroke on antiplatelet agents and statins.  Given SIRS picture will need inpatient status for close monitoring.   DVT prophylaxis: Heparin. Code Status: DNR. Family Communication: Patient's niece. Disposition Plan: To be determined. Consults called: None. Admission status: Inpatient.   Rise Patience MD [facility] Pager [phone].  If 7PM-7AM, please contact night-coverage [email or web address] Password [facility]  03/20/2020, 6:48 AM

## 2020-03-20 NOTE — Progress Notes (Signed)
CRITICAL VALUE ALERT  Critical Value:  Troponin (high sensitivity) 194  Date & Time Notied:  03/20/20 1105  Provider Notified: [place]  Orders Received/Actions taken: MD to review

## 2020-03-21 ENCOUNTER — Encounter (HOSPITAL_COMMUNITY): Payer: Self-pay | Admitting: Anesthesiology

## 2020-03-21 ENCOUNTER — Other Ambulatory Visit: Payer: Self-pay | Admitting: Physician Assistant

## 2020-03-21 LAB — GLUCOSE, CAPILLARY
Glucose-Capillary: 81 mg/dL (ref 70–99)
Glucose-Capillary: 119 mg/dL — ABNORMAL HIGH (ref 70–99)
Glucose-Capillary: 95 mg/dL (ref 70–99)
Glucose-Capillary: 131 mg/dL — ABNORMAL HIGH (ref 70–99)

## 2020-03-21 LAB — CREATININE, SERUM
Creatinine, Ser: 1.44 mg/dL — ABNORMAL HIGH (ref 0.61–1.24)
GFR calc Af Amer: 54 mL/min — ABNORMAL LOW (ref >60)
GFR calc non Af Amer: 47 mL/min — ABNORMAL LOW (ref >60)

## 2020-03-21 LAB — URINE CULTURE: Culture: <10000 — AB

## 2020-03-21 LAB — HEMOGLOBIN A1C
Hgb A1c MFr Bld: 5.7 % — ABNORMAL HIGH (ref 4.8–5.6)
Mean Plasma Glucose: 116.89 mg/dL

## 2020-03-21 MED ORDER — DEXTROSE 5 % IV SOLN
3.0000 g | INTRAVENOUS | Status: AC
  Filled 2020-03-21: qty 3000

## 2020-03-21 MED ORDER — SODIUM CHLORIDE 0.9 % IV SOLN: INTRAVENOUS | Status: DC

## 2020-03-21 NOTE — Consult Note (Signed)
[facility] Nurse Consult Note: Reason for Consult: Patient followed by the outpatient [facility] (Dr. Laurene Footman). Last seen by that provider on 03/17/20 for paring of periwound callus and debridement of calcaneous- left.   [place] Nurse is simultaneously consulted with Orthopedics (Dr. Eather Colas), who saw patient in consultation last evening. He is recommending a surgical procedure and patient is in agreement with the POC. A transfer is expected to [facility] for that procedure. We will defer topical care to Dr. Sharol Given post procedure and ultimately to Dr. Dellia Nims in the post acute arena. [place] nurse will provide conservative guidance for topical therapy in the interim. Wound type: Neuropathic Pressure Injury POA: N/A Measurements:  Per Dr. Janalyn Rouse office on 03/17/20 Wound [id number] red Drainage (amount, consistency, odor) None Periwound: dry Dressing procedure/placement/frequency: I will provide the Nursing staff with conservative guidance for topical care of the plantar neuropathic ulcerations using soap and water to cleanse, rinse and pat dry followed by twice daily application of NS dampened dressings topped with dry gauze and secured with Kerlix roll gauze/paper tape.   [place] nursing team will not follow, but will remain available to this patient, the nursing and medical teams.  Please re-consult if needed. Thanks, Maudie Flakes, MSN, RN, [place], [facility] Abbott  Pager# [phone]

## 2020-03-21 NOTE — Progress Notes (Signed)
PROGRESS NOTE  [name]  DOB: April 18, 1941  PCP: [name], MD UVO:[id number]  DOA: 03/19/2020  LOS: 1 day   Chief Complaint  Patient presents with  . Weakness   Brief narrative: [name] is a 79 y.o. male with history of previous stroke, hypertension, diabetes mellitus, bilateral transmetatarsal amputation of both feet. Patient was brought to ED on 10/3 from home by her niece after he was found to be increasingly weak for 24 hours  In the ED, patient's blood pressure was in the low normal range. WC count elevated 21.3, lactic acid level normal. Urinalysis and chest x-ray unremarkable.  Covid PCR negative. He was noted to have open wounds on the sole of both partially amputated feet.   X-ray of the right foot showed findings concerning for osteomyelitis of the medial cuneiform bone.   MRI right foot showed acute osteomyelitis of the medial cuneiform bone.  Patient was admitted to hospitalist service.  Subjective: Patient was seen and examined this morning. Lying down in bed. Not in distress. Noted a plan from orthopedic surgery to do surgery tomorrow 10/6.  Assessment/Plan: Acute osteomyelitis right medial cuneiform bone -Presented with worsening weakness, leukocytosis. -MRI of right foot showed acute osteomyelitis. -Currently on IV vancomycin. -Consultation obtained from Dr. Sharol Given. Noted plan to do excision of medial cuneiform bone on 10/6 at [facility]. -Patient to transfer to [facility] today.  Sepsis -At presentation, patient had leukocytosis with a source but was not febrile. In next few hours, patient started to spike fever up to 102. I think sepsis was evolving at the time of presentation. -Lactic acid level was normal. Hemodynamically stable. Continue maintenance IV fluid. -Continues to have low-grade fever. Continue to monitor on IV antibiotics  Generalized weakness  -could be from possible developing infection. -MRI brain unremarkable -PT/OT  eval.  Acute renal failure  -Creatinine remains elevated.  Continue to monitor on IV fluid. -Lisinopril and Metformin on hold. Recent Labs    02/05/20 1437 03/19/20 2157 03/20/20 1002 03/21/20 0448  BUN 9 30*  --   --   CREATININE 1.19 1.39* 1.36* 1.44*   Diabetes mellitus type 2 -A1c 5.4 on May 2020.  Repeat A1c. -Continue to trend glucose and sliding scale with Accu-Cheks. -Metformin on hold. -Continue Neurontin twice daily for neuropathy. Recent Labs  Lab 03/20/20 1143 03/20/20 1601 03/20/20 2148 03/21/20 0743 03/21/20 1209  GLUCAP 96 96 116* 81 119*    History of stroke  -Continue Plavix, statin.  Mobility: Needs PT eval postprocedure Code Status:   Code Status: DNR  Nutritional status: Body mass index is 23.41 kg/m.     Diet Order            Diet heart healthy/carb modified Room service appropriate? Yes; Fluid consistency: Thin  Diet effective now                 DVT prophylaxis: heparin injection 5,000 Units Start: 03/20/20 0700   Antimicrobials:  IV vancomycin Fluid: 75 mL/h normal saline Consultants: Dr. Sharol Given Family Communication:  None at bedside  Status is: Inpatient  Remains inpatient appropriate because:Ongoing active pain requiring inpatient pain management and Ongoing diagnostic testing needed not appropriate for outpatient work up   Dispo: The patient is from: Home              Anticipated d/c is to: Home              Anticipated d/c date is: 3 days  Patient currently is not medically stable to d/c.  Infusions:  . sodium chloride 50 mL/hr at 03/21/20 0932  . [START ON 03/22/2020]  ceFAZolin (ANCEF) IV    . vancomycin 1,500 mg (03/20/20 2159)    Scheduled Meds: . atorvastatin  40 mg Oral Daily  . clopidogrel  75 mg Oral Q breakfast  . cycloSPORINE  1 drop Both Eyes BID  . gabapentin  300 mg Oral BID  . heparin  5,000 Units Subcutaneous Q8H  . insulin aspart  0-9 Units Subcutaneous TID WC  . latanoprost  1 drop Both  Eyes QHS  . pregabalin  50 mg Oral BID  . timolol  1 drop Both Eyes BID    Antimicrobials: Anti-infectives (From admission, onward)   Start     Dose/Rate Route Frequency Ordered Stop   03/22/20 0600  ceFAZolin (ANCEF) 3 g in dextrose 5 % 50 mL IVPB        3 g 100 mL/hr over 30 Minutes Intravenous On call to O.R. 03/21/20 1214 03/23/20 0559   03/20/20 2200  vancomycin (VANCOREADY) IVPB 1500 mg/300 mL        1,500 mg 150 mL/hr over 120 Minutes Intravenous Every 24 hours 03/20/20 1510     03/20/20 1600  vancomycin (VANCOREADY) IVPB 1250 mg/250 mL        1,250 mg 166.7 mL/hr over 90 Minutes Intravenous  Once 03/20/20 1510 03/20/20 1711   03/19/20 2315  cefTRIAXone (ROCEPHIN) 1 g in sodium chloride 0.9 % 100 mL IVPB        1 g 200 mL/hr over 30 Minutes Intravenous  Once 03/19/20 2308 03/20/20 0049      PRN meds: acetaminophen **OR** acetaminophen, ondansetron **OR** ondansetron (ZOFRAN) IV, traMADol   Objective: Vitals:   03/21/20 0502 03/21/20 0900  BP: (!) 104/50 114/68  Pulse: (!) 57 70  Resp: 20 18  Temp: 97.8 F (36.6 C) 100.1 F (37.8 C)  SpO2: 100% 100%    Intake/Output Summary (Last 24 hours) at 03/21/2020 1307 Last data filed at 03/21/2020 0900 Gross per 24 hour  Intake 2730.62 ml  Output 2275 ml  Net 455.62 ml   Filed Weights   03/19/20 2041 03/21/20 0500  Weight: 81.6 kg 80.5 kg   Weight change: -1.148 kg Body mass index is 23.41 kg/m.   Physical Exam: General exam: Appears calm and comfortable. Not in physical distress Skin: No rashes, lesions or ulcers. HEENT: Atraumatic, normocephalic, supple neck, no obvious bleeding Lungs: Clear to auscultation bilaterally CVS: Regular rate and rhythm, no murmur GI/Abd soft, nontender, nondistended, bowel sound present CNS: Alert, awake, oriented x3 Psychiatry: Mood appropriate Extremities: No pedal edema, no calf redness, bilateral foot amputation status. There is a wound on the bottom of the right foot.  Data  Review: I have personally reviewed the laboratory data and studies available.  Recent Labs  Lab 03/17/20 0953 03/19/20 2157 03/20/20 1002  WBC 6.0 21.3* 15.0*  NEUTROABS 3.0 19.1*  --   HGB 12.4* 11.3* 10.4*  HCT 39.1 34.4* 32.7*  MCV 90.9 88.2 89.3  PLT 315 241 215   Recent Labs  Lab 03/19/20 2157 03/20/20 1002 03/21/20 0448  NA 136  --   --   K 3.9  --   --   CL 103  --   --   CO2 22  --   --   GLUCOSE 111*  --   --   BUN 30*  --   --   CREATININE 1.39*  1.36* 1.44*  CALCIUM 8.7*  --   --     F/u labs ordered.  Signed, Terrilee Croak, MD [facility] 03/21/2020

## 2020-03-21 NOTE — Progress Notes (Signed)
Taking over care of patient agree with previous RN assessment. Denies any needs at this time. Will continue to monitor.  

## 2020-03-21 NOTE — TOC Progression Note (Signed)
Transition of Care [facility]) - Progression Note    Patient Details  Name: [name] MRN: [id number] Date of Birth: 09/24/1940  [facility] CM/SW Contact  Cierah Crader, Juliann Pulse, RN Phone Number: 03/21/2020, 10:23 AM  Clinical Narrative:Active w/[facility] HHRN-rep [place] following.for transfer to [facility] for procedure.       Expected Discharge Plan: [place] Barriers to Discharge: Continued Medical Work up  Expected Discharge Plan and Services Expected Discharge Plan: [place]   Discharge Planning Services: CM Consult Post Acute Care Choice: [place] arrangements for the past 2 months: Single Family Home                                       Social Determinants of Health (SDOH) Interventions    Readmission Risk Interventions Readmission Risk Prevention Plan 11/01/2018  Transportation Screening Complete  PCP or Specialist Appt within 5-7 Days Complete  Home Care Screening Complete  Medication Review (RN CM) Complete  Some recent data might be hidden

## 2020-03-21 NOTE — Plan of Care (Signed)

## 2020-03-22 ENCOUNTER — Ambulatory Visit (HOSPITAL_BASED_OUTPATIENT_CLINIC_OR_DEPARTMENT_OTHER): Payer: Medicare Other | Admitting: Physician Assistant

## 2020-03-22 ENCOUNTER — Encounter (HOSPITAL_COMMUNITY)
Admission: EM | Disposition: A | Discharge status: Home / Self Care | Payer: Self-pay | Source: Home / Self Care | Attending: Internal Medicine

## 2020-03-22 LAB — BASIC METABOLIC PANEL
Anion gap: 6 (ref 5–15)
BUN: 19 mg/dL (ref 8–23)
CO2: 22 mmol/L (ref 22–32)
Calcium: 8 mg/dL — ABNORMAL LOW (ref 8.9–10.3)
Chloride: 106 mmol/L (ref 98–111)
Creatinine, Ser: 1.37 mg/dL — ABNORMAL HIGH (ref 0.61–1.24)
GFR calc non Af Amer: 49 mL/min — ABNORMAL LOW (ref >60)
Glucose, Bld: 110 mg/dL — ABNORMAL HIGH (ref 70–99)
Potassium: 3.6 mmol/L (ref 3.5–5.1)
Sodium: 134 mmol/L — ABNORMAL LOW (ref 135–145)

## 2020-03-22 LAB — CBC WITH DIFFERENTIAL/PLATELET
Abs Immature Granulocytes: 0.03 10*3/uL (ref 0.00–0.07)
Basophils Absolute: 0 10*3/uL (ref 0.0–0.1)
Basophils Relative: 0 %
Eosinophils Absolute: 0.7 10*3/uL — ABNORMAL HIGH (ref 0.0–0.5)
Eosinophils Relative: 10 %
HCT: 30.6 % — ABNORMAL LOW (ref 39.0–52.0)
Hemoglobin: 9.9 g/dL — ABNORMAL LOW (ref 13.0–17.0)
Immature Granulocytes: 0 %
Lymphocytes Relative: 16 %
Lymphs Abs: 1.1 10*3/uL (ref 0.7–4.0)
MCH: 29 pg (ref 26.0–34.0)
MCHC: 32.4 g/dL (ref 30.0–36.0)
MCV: 89.7 fL (ref 80.0–100.0)
Monocytes Absolute: 0.9 10*3/uL (ref 0.1–1.0)
Monocytes Relative: 13 %
Neutro Abs: 4.3 10*3/uL (ref 1.7–7.7)
Neutrophils Relative %: 61 %
Platelets: 196 10*3/uL (ref 150–400)
RBC: 3.41 MIL/uL — ABNORMAL LOW (ref 4.22–5.81)
RDW: 16.9 % — ABNORMAL HIGH (ref 11.5–15.5)
WBC: 7.1 10*3/uL (ref 4.0–10.5)
nRBC: 0 % (ref 0.0–0.2)

## 2020-03-22 LAB — GLUCOSE, CAPILLARY
Glucose-Capillary: 84 mg/dL (ref 70–99)
Glucose-Capillary: 104 mg/dL — ABNORMAL HIGH (ref 70–99)
Glucose-Capillary: 107 mg/dL — ABNORMAL HIGH (ref 70–99)

## 2020-03-22 LAB — CREATININE, SERUM
Creatinine, Ser: 1.37 mg/dL — ABNORMAL HIGH (ref 0.61–1.24)
GFR calc non Af Amer: 49 mL/min — ABNORMAL LOW (ref >60)

## 2020-03-22 LAB — LACTIC ACID, PLASMA: Lactic Acid, Venous: 0.9 mmol/L (ref 0.5–1.9)

## 2020-03-22 SURGERY — IRRIGATION AND DEBRIDEMENT EXTREMITY
Anesthesia: Choice | Laterality: Right

## 2020-03-22 MED ORDER — MUPIROCIN 2 % EX OINT
1.0000 "application " | TOPICAL_OINTMENT | Freq: Two times a day (BID) | CUTANEOUS | Status: AC
  Administered 2020-03-23 – 2020-03-26 (×7): 1 via NASAL
  Filled 2020-03-22 (×3): qty 22

## 2020-03-22 NOTE — Progress Notes (Signed)
PROGRESS NOTE  [name]  DOB: [birth year]  PCP: [name], MD KGM:[id number]  DOA: 03/19/2020  LOS: 2 days   Chief Complaint  Patient presents with   Weakness   Brief narrative: [name] is a 79 y.o. male with history of previous stroke, hypertension, diabetes mellitus, bilateral transmetatarsal amputation of both feet. Patient was brought to ED on 10/3 from home by her niece after he was found to be increasingly weak for 24 hours  In the ED, patient's blood pressure was in the low normal range. WC count elevated 21.3, lactic acid level normal. Urinalysis and chest x-ray unremarkable.  Covid PCR negative. He was noted to have open wounds on the sole of both partially amputated feet.   X-ray of the right foot showed findings concerning for osteomyelitis of the medial cuneiform bone.   MRI right foot showed acute osteomyelitis of the medial cuneiform bone.  Patient was admitted to hospitalist service.  Subjective: Patient was seen and examined this morning. Lying down in bed. Not in distress. Noted a plan from orthopedic surgery to do surgery this afternoon at [facility]  Pending transfer  Assessment/Plan: Acute osteomyelitis right medial cuneiform bone -Presented with worsening weakness, leukocytosis. -MRI of right foot showed acute osteomyelitis. -Currently on IV vancomycin. -Consultation obtained from Dr. Sharol Given. Noted plan to do excision of medial cuneiform bone on 10/6 at [facility]. -Patient is pending transfer to [facility] today.   Sepsis -At presentation, patient had leukocytosis with a source but was not febrile. In next few hours, patient started to spike fever up to 102. I think sepsis was evolving at the time of presentation. -Lactic acid level was normal. Hemodynamically stable. Continue maintenance IV fluid. -No fever in last 24 hours.  Continue to monitor on IV antibiotics  Generalized weakness  -could be from possible developing  infection. -MRI brain unremarkable -PT/OT eval.  Acute renal failure  -Creatinine remains elevated.  Continue to monitor on IV fluid. -Lisinopril and Metformin on hold. Recent Labs    02/05/20 1437 03/19/20 2157 03/20/20 1002 03/21/20 0448 03/22/20 0029  BUN 9 30*  --   --  19  CREATININE 1.19 1.39* 1.36* 1.44* 1.37*   1.37*   Diabetes mellitus type 2 -A1c 5.4 on May 2020.  Repeat A1c. -Continue to trend glucose and sliding scale with Accu-Cheks. -Metformin on hold. -Continue Neurontin twice daily for neuropathy. Recent Labs  Lab 03/21/20 1209 03/21/20 1628 03/21/20 2043 03/22/20 0724 03/22/20 1151  GLUCAP 119* 95 131* 84 104*    History of stroke  -Continue Plavix, statin.  Mobility: Needs PT eval postprocedure Code Status:   Code Status: DNR  Nutritional status: Body mass index is 24.2 kg/m.     Diet Order            Diet heart healthy/carb modified Room service appropriate? Yes; Fluid consistency: Thin  Diet effective now                 DVT prophylaxis: heparin injection 5,000 Units Start: 03/20/20 0700   Antimicrobials:  IV vancomycin Fluid: 75 mL/h normal saline Consultants: Dr. Sharol Given Family Communication:  None at bedside  Status is: Inpatient  Remains inpatient appropriate because:Ongoing active pain requiring inpatient pain management and Ongoing diagnostic testing needed not appropriate for outpatient work up   Dispo: The patient is from: Home              Anticipated d/c is to: Home  Anticipated d/c date is: 3 days              Patient currently is not medically stable to d/c.  Infusions:   sodium chloride 75 mL/hr at 03/22/20 1016    ceFAZolin (ANCEF) IV     vancomycin Stopped (03/22/20 0009)    Scheduled Meds:  atorvastatin  40 mg Oral Daily   clopidogrel  75 mg Oral Q breakfast   cycloSPORINE  1 drop Both Eyes BID   gabapentin  300 mg Oral BID   heparin  5,000 Units Subcutaneous Q8H   insulin aspart  0-9  Units Subcutaneous TID WC   latanoprost  1 drop Both Eyes QHS   mupirocin ointment  1 application Nasal BID   pregabalin  50 mg Oral BID   timolol  1 drop Both Eyes BID    Antimicrobials: Anti-infectives (From admission, onward)   Start     Dose/Rate Route Frequency Ordered Stop   03/22/20 0600  ceFAZolin (ANCEF) 3 g in dextrose 5 % 50 mL IVPB        3 g 100 mL/hr over 30 Minutes Intravenous On call to O.R. 03/21/20 1214 03/23/20 0559   03/20/20 2200  vancomycin (VANCOREADY) IVPB 1500 mg/300 mL        1,500 mg 150 mL/hr over 120 Minutes Intravenous Every 24 hours 03/20/20 1510     03/20/20 1600  vancomycin (VANCOREADY) IVPB 1250 mg/250 mL        1,250 mg 166.7 mL/hr over 90 Minutes Intravenous  Once 03/20/20 1510 03/20/20 1711   03/19/20 2315  cefTRIAXone (ROCEPHIN) 1 g in sodium chloride 0.9 % 100 mL IVPB        1 g 200 mL/hr over 30 Minutes Intravenous  Once 03/19/20 2308 03/20/20 0049      PRN meds: acetaminophen **OR** acetaminophen, ondansetron **OR** ondansetron (ZOFRAN) IV, traMADol   Objective: Vitals:   03/22/20 0449 03/22/20 1405  BP: 117/71 116/75  Pulse: 69 (!) 59  Resp: 18 18  Temp: 98.4 F (36.9 C) 98.1 F (36.7 C)  SpO2: 100% 100%    Intake/Output Summary (Last 24 hours) at 03/22/2020 1446 Last data filed at 03/22/2020 1215 Gross per 24 hour  Intake 2325.5 ml  Output 2675 ml  Net -349.5 ml   Filed Weights   03/19/20 2041 03/21/20 0500 03/22/20 0449  Weight: 81.6 kg 80.5 kg 83.2 kg   Weight change: 2.7 kg Body mass index is 24.2 kg/m.   Physical Exam: General exam: Appears calm and comfortable. Not in physical distress Skin: No rashes, lesions or ulcers. HEENT: Atraumatic, normocephalic, supple neck, no obvious bleeding Lungs: Clear to auscultation bilaterally CVS: Regular rate and rhythm, no murmur GI/Abd soft, nontender, nondistended, bowel sound present CNS: Alert, awake, oriented x3 Psychiatry: Mood appropriate Extremities: No pedal  edema, no calf redness, bilateral foot amputation status. There is a wound on the bottom of the right foot.  Data Review: I have personally reviewed the laboratory data and studies available.  Recent Labs  Lab 03/17/20 0953 03/19/20 2157 03/20/20 1002 03/22/20 0029  WBC 6.0 21.3* 15.0* 7.1  NEUTROABS 3.0 19.1*  --  4.3  HGB 12.4* 11.3* 10.4* 9.9*  HCT 39.1 34.4* 32.7* 30.6*  MCV 90.9 88.2 89.3 89.7  PLT 315 241 215 196   Recent Labs  Lab 03/19/20 2157 03/20/20 1002 03/21/20 0448 03/22/20 0029  NA 136  --   --  134*  K 3.9  --   --  3.6  CL 103  --   --  106  CO2 22  --   --  22  GLUCOSE 111*  --   --  110*  BUN 30*  --   --  19  CREATININE 1.39* 1.36* 1.44* 1.37*   1.37*  CALCIUM 8.7*  --   --  8.0*    F/u labs ordered.  Signed, Terrilee Croak, MD [facility] 03/22/2020

## 2020-03-22 NOTE — Plan of Care (Signed)

## 2020-03-22 NOTE — Interval H&P Note (Signed)
History and Physical Interval Note:  03/22/2020 6:53 AM  [name]  has presented today for surgery, with the diagnosis of Osteomyelitis Right Medial Cuneoform.  The various methods of treatment have been discussed with the patient and family. After consideration of risks, benefits and other options for treatment, the patient has consented to  Procedure(s): EXCISION RIGHT MEDIAL CUNEFORM (Right) as a surgical intervention.  The patient's history has been reviewed, patient examined, no change in status, stable for surgery.  I have reviewed the patient's chart and labs.  Questions were answered to the patient's satisfaction.     Newt Minion

## 2020-03-23 ENCOUNTER — Other Ambulatory Visit: Payer: Self-pay | Admitting: Physician Assistant

## 2020-03-23 ENCOUNTER — Encounter (HOSPITAL_COMMUNITY): Payer: Self-pay | Admitting: Internal Medicine

## 2020-03-23 ENCOUNTER — Other Ambulatory Visit: Payer: Self-pay

## 2020-03-23 LAB — GLUCOSE, CAPILLARY
Glucose-Capillary: 100 mg/dL — ABNORMAL HIGH (ref 70–99)
Glucose-Capillary: 99 mg/dL (ref 70–99)
Glucose-Capillary: 123 mg/dL — ABNORMAL HIGH (ref 70–99)
Glucose-Capillary: 92 mg/dL (ref 70–99)

## 2020-03-23 LAB — CBC WITH DIFFERENTIAL/PLATELET
Abs Immature Granulocytes: 0.02 10*3/uL (ref 0.00–0.07)
Basophils Absolute: 0 10*3/uL (ref 0.0–0.1)
Basophils Relative: 1 %
Eosinophils Absolute: 1 10*3/uL — ABNORMAL HIGH (ref 0.0–0.5)
Eosinophils Relative: 15 %
HCT: 30.8 % — ABNORMAL LOW (ref 39.0–52.0)
Hemoglobin: 9.9 g/dL — ABNORMAL LOW (ref 13.0–17.0)
Immature Granulocytes: 0 %
Lymphocytes Relative: 17 %
Lymphs Abs: 1.2 10*3/uL (ref 0.7–4.0)
MCH: 28.9 pg (ref 26.0–34.0)
MCHC: 32.1 g/dL (ref 30.0–36.0)
MCV: 89.8 fL (ref 80.0–100.0)
Monocytes Absolute: 1 10*3/uL (ref 0.1–1.0)
Monocytes Relative: 14 %
Neutro Abs: 3.6 10*3/uL (ref 1.7–7.7)
Neutrophils Relative %: 53 %
Platelets: 192 10*3/uL (ref 150–400)
RBC: 3.43 MIL/uL — ABNORMAL LOW (ref 4.22–5.81)
RDW: 16.7 % — ABNORMAL HIGH (ref 11.5–15.5)
WBC: 6.9 10*3/uL (ref 4.0–10.5)
nRBC: 0 % (ref 0.0–0.2)

## 2020-03-23 LAB — BASIC METABOLIC PANEL
Anion gap: 10 (ref 5–15)
BUN: 12 mg/dL (ref 8–23)
CO2: 20 mmol/L — ABNORMAL LOW (ref 22–32)
Calcium: 8.2 mg/dL — ABNORMAL LOW (ref 8.9–10.3)
Chloride: 110 mmol/L (ref 98–111)
Creatinine, Ser: 1.04 mg/dL (ref 0.61–1.24)
GFR calc non Af Amer: >60 mL/min (ref >60)
Glucose, Bld: 89 mg/dL (ref 70–99)
Potassium: 3.4 mmol/L — ABNORMAL LOW (ref 3.5–5.1)
Sodium: 140 mmol/L (ref 135–145)

## 2020-03-23 LAB — SURGICAL PCR SCREEN
MRSA, PCR: POSITIVE — AB
Staphylococcus aureus: POSITIVE — AB

## 2020-03-23 MED ORDER — CEFAZOLIN SODIUM-DEXTROSE 2-4 GM/100ML-% IV SOLN
2.0000 g | INTRAVENOUS | Status: DC
  Filled 2020-03-23: qty 100

## 2020-03-23 NOTE — Progress Notes (Signed)
Pharmacy Antibiotic Note  [name] is a 79 y.o. male with PMH bilat TMA admitted on 03/19/2020 with weakness. Open wounds on soles of both feet; Xray concerning for osteomyelitis, confirmed by MRI.  Pharmacy has been consulted for vancomycin dosing. Baseline SCr appears to be 1.0-1.5.  Planned excision of medial cuneiform bone on 10/8 at [facility].  Plan:  Continue vancomycin  1500 mg IV q24 hr  SCr q48 while on vanc  Monitor clinical course, renal function, cultures as available    Height: 6\' 1"  (185.4 cm) Weight: 80.7 kg (177 lb 14.6 oz) IBW/kg (Calculated) : 79.9  Temp (24hrs), Avg:98.4 F (36.9 C), Min:97.7 F (36.5 C), Max:99.5 F (37.5 C)  Recent Labs  Lab 03/17/20 0953 03/19/20 2157 03/19/20 2359 03/20/20 1002 03/21/20 0448 03/22/20 0029 03/23/20 0440  WBC 6.0 21.3*  --  15.0*  --  7.1 6.9  CREATININE  --  1.39*  --  1.36* 1.44* 1.37*  1.37* 1.04  LATICACIDVEN  --   --  1.5  --   --  0.9  --     Estimated Creatinine Clearance: 67.2 mL/min (by C-G formula based on SCr of 1.04 mg/dL).    No Known Allergies   Thank you for allowing pharmacy to be a part of this patient's care.   Royetta Asal, PharmD, BCPS 03/23/2020 12:48 PM

## 2020-03-23 NOTE — Progress Notes (Signed)
Bed approved for transfer to [facility] 5N17C. Report given to nurse on 5N and [facility] called for transport. Patient had no personal belongings to be transported.

## 2020-03-23 NOTE — Care Management Important Message (Signed)
Important Message  Patient Details IM Letter given to the Patient Name: [name] MRN: [id number] Date of Birth: [birth year]   Medicare Important Message Given:  Yes     Kerin Salen 03/23/2020, 12:39 PM

## 2020-03-23 NOTE — Progress Notes (Signed)
PROGRESS NOTE  [name]  DOB: 01-17-1941  PCP: [name], MD HQP:[id number]  DOA: 03/19/2020  LOS: 3 days   Chief Complaint  Patient presents with  . Weakness   Brief narrative: [name] is a 79 y.o. male with history of previous stroke, hypertension, diabetes mellitus, bilateral transmetatarsal amputation of both feet. Patient was brought to ED on 10/3 from home by her niece after he was found to be increasingly weak for 24 hours  In the ED, patient's blood pressure was in the low normal range. WC count elevated 21.3, lactic acid level normal. Urinalysis and chest x-ray unremarkable.  Covid PCR negative. He was noted to have open wounds on the sole of both partially amputated feet.   X-ray of the right foot showed findings concerning for osteomyelitis of the medial cuneiform bone.   MRI right foot showed acute osteomyelitis of the medial cuneiform bone.  Patient was admitted to hospitalist service.  Subjective: Patient was seen and examined this morning. Lying down in bed. Not in distress. Patient could not be transferred to [facility] yesterday because of inability bed.  Pending transfer today. Tentatively plan for surgery tomorrow. No fever last 24 hours.  Hemodynamically stable.  Assessment/Plan: Acute osteomyelitis right medial cuneiform bone -Presented with worsening weakness, leukocytosis. -MRI of right foot showed acute osteomyelitis. -Currently on IV vancomycin. -Consultation obtained from Dr. Sharol Given. Noted plan to do excision of medial cuneiform bone on 10/8 at [facility]. -Patient is pending transfer to [facility] today.   Sepsis -At presentation, patient had leukocytosis with a source but was not febrile. In next few hours, patient started to spike fever up to 102. I think sepsis was evolving at the time of presentation. -Lactic acid level was normal. Hemodynamically stable.  -No fever in last 24 hours.  Continue IV antibiotics.  Adequately  hydrated.  Okay to stop IV fluid today.  Generalized weakness  -could be from possible developing infection. -MRI brain unremarkable -PT/OT eval.  Acute renal failure  -Creatinine improved with IV hydration.   -Lisinopril and Metformin on hold. Recent Labs    02/05/20 1437 03/19/20 2157 03/20/20 1002 03/21/20 0448 03/22/20 0029 03/23/20 0440  BUN 9 30*  --   --  19 12  CREATININE 1.19 1.39* 1.36* 1.44* 1.37*  1.37* 1.04   Diabetes mellitus type 2 -A1c 5.7 on 10/5. -Continue to trend glucose and sliding scale with Accu-Cheks. -Metformin on hold. -Continue Neurontin twice daily for neuropathy. Recent Labs  Lab 03/21/20 2043 03/22/20 0724 03/22/20 1151 03/22/20 1635 03/23/20 0733  GLUCAP 131* 84 104* 107* 100*   History of stroke  -Continue Plavix, statin.  Mobility: Needs PT eval postprocedure Code Status:   Code Status: DNR  Nutritional status: Body mass index is 23.47 kg/m.     Diet Order            Diet heart healthy/carb modified Room service appropriate? Yes; Fluid consistency: Thin  Diet effective now                 DVT prophylaxis: heparin injection 5,000 Units Start: 03/20/20 0700   Antimicrobials:  IV vancomycin Fluid: IV fluid is stopped today Consultants: Dr. Sharol Given Family Communication:  None at bedside.  Updated over the phone.  Status is: Inpatient  Remains inpatient appropriate because -pending transfer to [facility] for surgery tomorrow.   Dispo: The patient is from: Home              Anticipated d/c is to: Home  Anticipated d/c date is: 3 days              Patient currently is not medically stable to d/c.  Infusions:  . sodium chloride Stopped (03/23/20 0959)  . vancomycin Stopped (03/22/20 2341)    Scheduled Meds: . atorvastatin  40 mg Oral Daily  . clopidogrel  75 mg Oral Q breakfast  . cycloSPORINE  1 drop Both Eyes BID  . gabapentin  300 mg Oral BID  . heparin  5,000 Units Subcutaneous Q8H  . insulin aspart   0-9 Units Subcutaneous TID WC  . latanoprost  1 drop Both Eyes QHS  . mupirocin ointment  1 application Nasal BID  . pregabalin  50 mg Oral BID  . timolol  1 drop Both Eyes BID    Antimicrobials: Anti-infectives (From admission, onward)   Start     Dose/Rate Route Frequency Ordered Stop   03/22/20 0600  ceFAZolin (ANCEF) 3 g in dextrose 5 % 50 mL IVPB        3 g 100 mL/hr over 30 Minutes Intravenous On call to O.R. 03/21/20 1214 03/23/20 0559   03/20/20 2200  vancomycin (VANCOREADY) IVPB 1500 mg/300 mL        1,500 mg 150 mL/hr over 120 Minutes Intravenous Every 24 hours 03/20/20 1510     03/20/20 1600  vancomycin (VANCOREADY) IVPB 1250 mg/250 mL        1,250 mg 166.7 mL/hr over 90 Minutes Intravenous  Once 03/20/20 1510 03/20/20 1711   03/19/20 2315  cefTRIAXone (ROCEPHIN) 1 g in sodium chloride 0.9 % 100 mL IVPB        1 g 200 mL/hr over 30 Minutes Intravenous  Once 03/19/20 2308 03/20/20 0049      PRN meds: acetaminophen **OR** acetaminophen, ondansetron **OR** ondansetron (ZOFRAN) IV, traMADol   Objective: Vitals:   03/22/20 1405 03/23/20 0503  BP: 116/75 120/67  Pulse: (!) 59 67  Resp: 18 18  Temp: 98.1 F (36.7 C) 99.5 F (37.5 C)  SpO2: 100% 95%    Intake/Output Summary (Last 24 hours) at 03/23/2020 1100 Last data filed at 03/23/2020 0845 Gross per 24 hour  Intake 2339.32 ml  Output 1075 ml  Net 1264.32 ml   Filed Weights   03/21/20 0500 03/22/20 0449 03/23/20 0503  Weight: 80.5 kg 83.2 kg 80.7 kg   Weight change: -2.5 kg Body mass index is 23.47 kg/m.   Physical Exam: General exam: Appears calm and comfortable. Not in physical distress Skin: No rashes, lesions or ulcers. HEENT: Atraumatic, normocephalic, supple neck, no obvious bleeding Lungs: Clear to auscultation bilaterally CVS: Regular rate and rhythm, no murmur GI/Abd soft, nontender, nondistended, bowel sound present CNS: Alert, awake, oriented x3 Psychiatry: Mood appropriate Extremities: No  pedal edema, no calf redness, bilateral foot amputation status. There is a wound on the bottom of the right foot, dressing on by nurse  Data Review: I have personally reviewed the laboratory data and studies available.  Recent Labs  Lab 03/17/20 0953 03/19/20 2157 03/20/20 1002 03/22/20 0029 03/23/20 0440  WBC 6.0 21.3* 15.0* 7.1 6.9  NEUTROABS 3.0 19.1*  --  4.3 3.6  HGB 12.4* 11.3* 10.4* 9.9* 9.9*  HCT 39.1 34.4* 32.7* 30.6* 30.8*  MCV 90.9 88.2 89.3 89.7 89.8  PLT 315 241 215 196 192   Recent Labs  Lab 03/19/20 2157 03/20/20 1002 03/21/20 0448 03/22/20 0029 03/23/20 0440  NA 136  --   --  134* 140  K 3.9  --   --  3.6 3.4*  CL 103  --   --  106 110  CO2 22  --   --  22 20*  GLUCOSE 111*  --   --  110* 89  BUN 30*  --   --  19 12  CREATININE 1.39* 1.36* 1.44* 1.37*  1.37* 1.04  CALCIUM 8.7*  --   --  8.0* 8.2*    F/u labs ordered.  Signed, Terrilee Croak, MD [facility] 03/23/2020

## 2020-03-24 ENCOUNTER — Encounter (HOSPITAL_COMMUNITY): Payer: Self-pay | Admitting: Internal Medicine

## 2020-03-24 ENCOUNTER — Inpatient Hospital Stay (HOSPITAL_COMMUNITY): Payer: Medicare Other | Admitting: Anesthesiology

## 2020-03-24 ENCOUNTER — Encounter (HOSPITAL_COMMUNITY)
Admission: EM | Disposition: A | Discharge status: Home / Self Care | Payer: Self-pay | Source: Home / Self Care | Attending: Internal Medicine

## 2020-03-24 DIAGNOSIS — M86271 Subacute osteomyelitis, right ankle and foot: Secondary | ICD-10-CM | POA: Diagnosis not present

## 2020-03-24 DIAGNOSIS — R6510 Systemic inflammatory response syndrome (SIRS) of non-infectious origin without acute organ dysfunction: Secondary | ICD-10-CM | POA: Diagnosis not present

## 2020-03-24 HISTORY — PX: I & D EXTREMITY: SHX5045

## 2020-03-24 LAB — CBC WITH DIFFERENTIAL/PLATELET
Abs Immature Granulocytes: 0.07 10*3/uL (ref 0.00–0.07)
Basophils Absolute: 0 10*3/uL (ref 0.0–0.1)
Basophils Relative: 0 %
Eosinophils Absolute: 1.4 10*3/uL — ABNORMAL HIGH (ref 0.0–0.5)
Eosinophils Relative: 18 %
HCT: 30.4 % — ABNORMAL LOW (ref 39.0–52.0)
Hemoglobin: 9.8 g/dL — ABNORMAL LOW (ref 13.0–17.0)
Immature Granulocytes: 1 %
Lymphocytes Relative: 16 %
Lymphs Abs: 1.2 10*3/uL (ref 0.7–4.0)
MCH: 27.9 pg (ref 26.0–34.0)
MCHC: 32.2 g/dL (ref 30.0–36.0)
MCV: 86.6 fL (ref 80.0–100.0)
Monocytes Absolute: 0.8 10*3/uL (ref 0.1–1.0)
Monocytes Relative: 11 %
Neutro Abs: 4.1 10*3/uL (ref 1.7–7.7)
Neutrophils Relative %: 54 %
Platelets: 200 10*3/uL (ref 150–400)
RBC: 3.51 MIL/uL — ABNORMAL LOW (ref 4.22–5.81)
RDW: 16.4 % — ABNORMAL HIGH (ref 11.5–15.5)
WBC: 7.6 10*3/uL (ref 4.0–10.5)
nRBC: 0 % (ref 0.0–0.2)

## 2020-03-24 LAB — BASIC METABOLIC PANEL
Anion gap: 9 (ref 5–15)
BUN: 10 mg/dL (ref 8–23)
CO2: 21 mmol/L — ABNORMAL LOW (ref 22–32)
Calcium: 8 mg/dL — ABNORMAL LOW (ref 8.9–10.3)
Chloride: 108 mmol/L (ref 98–111)
Creatinine, Ser: 1.03 mg/dL (ref 0.61–1.24)
GFR calc non Af Amer: >60 mL/min (ref >60)
Glucose, Bld: 95 mg/dL (ref 70–99)
Potassium: 3.3 mmol/L — ABNORMAL LOW (ref 3.5–5.1)
Sodium: 138 mmol/L (ref 135–145)

## 2020-03-24 LAB — GLUCOSE, CAPILLARY
Glucose-Capillary: 80 mg/dL (ref 70–99)
Glucose-Capillary: 99 mg/dL (ref 70–99)
Glucose-Capillary: 89 mg/dL (ref 70–99)
Glucose-Capillary: 59 mg/dL — ABNORMAL LOW (ref 70–99)
Glucose-Capillary: 62 mg/dL — ABNORMAL LOW (ref 70–99)
Glucose-Capillary: 127 mg/dL — ABNORMAL HIGH (ref 70–99)

## 2020-03-24 SURGERY — IRRIGATION AND DEBRIDEMENT EXTREMITY
Anesthesia: General | Site: Foot | Laterality: Right

## 2020-03-24 MED ORDER — FENTANYL CITRATE (PF) 250 MCG/5ML IJ SOLN
INTRAMUSCULAR | Status: AC
  Filled 2020-03-24: qty 5

## 2020-03-24 MED ORDER — LIDOCAINE 2% (20 MG/ML) 5 ML SYRINGE
INTRAMUSCULAR | Status: AC
  Filled 2020-03-24: qty 5

## 2020-03-24 MED ORDER — ACETAMINOPHEN 325 MG PO TABS: 325.0000 mg | ORAL_TABLET | ORAL | Status: DC | PRN

## 2020-03-24 MED ORDER — PROPOFOL 10 MG/ML IV BOLUS
INTRAVENOUS | Status: AC
  Filled 2020-03-24: qty 60

## 2020-03-24 MED ORDER — LACTATED RINGERS IV SOLN: INTRAVENOUS | Status: DC

## 2020-03-24 MED ORDER — OXYCODONE HCL 5 MG PO TABS: 5.0000 mg | ORAL_TABLET | Freq: Once | ORAL | Status: DC | PRN

## 2020-03-24 MED ORDER — DEXTROSE 50 % IV SOLN: 25.0000 mL | INTRAVENOUS | Status: DC | PRN

## 2020-03-24 MED ORDER — FENTANYL CITRATE (PF) 250 MCG/5ML IJ SOLN
INTRAMUSCULAR | Status: DC | PRN
Start: 2020-03-24 — End: 2020-03-24
  Administered 2020-03-24: 50 ug via INTRAVENOUS

## 2020-03-24 MED ORDER — EPHEDRINE 5 MG/ML INJ
INTRAVENOUS | Status: AC
  Filled 2020-03-24: qty 10

## 2020-03-24 MED ORDER — DEXTROSE 50 % IV SOLN
INTRAVENOUS | Status: AC
  Filled 2020-03-24: qty 50

## 2020-03-24 MED ORDER — SODIUM CHLORIDE 0.9 % IV SOLN
2.0000 g | Freq: Three times a day (TID) | INTRAVENOUS | Status: AC
  Administered 2020-03-24 – 2020-03-25 (×5): 2 g via INTRAVENOUS
  Filled 2020-03-24 (×5): qty 2

## 2020-03-24 MED ORDER — CHLORHEXIDINE GLUCONATE 0.12 % MT SOLN
15.0000 mL | OROMUCOSAL | Status: AC
  Administered 2020-03-24: 15 mL via OROMUCOSAL
  Filled 2020-03-24: qty 15

## 2020-03-24 MED ORDER — EPHEDRINE SULFATE-NACL 50-0.9 MG/10ML-% IV SOSY
PREFILLED_SYRINGE | INTRAVENOUS | Status: DC | PRN
  Administered 2020-03-24 (×2): 10 mg via INTRAVENOUS

## 2020-03-24 MED ORDER — DEXTROSE 50 % IV SOLN
25.0000 mL | Freq: Once | INTRAVENOUS | Status: AC
  Administered 2020-03-24: 25 mL via INTRAVENOUS

## 2020-03-24 MED ORDER — PHENYLEPHRINE HCL-NACL 10-0.9 MG/250ML-% IV SOLN
INTRAVENOUS | Status: DC | PRN
  Administered 2020-03-24: 35 ug/min via INTRAVENOUS

## 2020-03-24 MED ORDER — 0.9 % SODIUM CHLORIDE (POUR BTL) OPTIME
TOPICAL | Status: DC | PRN
  Administered 2020-03-24: 2000 mL

## 2020-03-24 MED ORDER — ONDANSETRON HCL 4 MG/2ML IJ SOLN
INTRAMUSCULAR | Status: AC
  Filled 2020-03-24: qty 2

## 2020-03-24 MED ORDER — ACETAMINOPHEN 160 MG/5ML PO SOLN: 325.0000 mg | ORAL | Status: DC | PRN

## 2020-03-24 MED ORDER — PROPOFOL 10 MG/ML IV BOLUS
INTRAVENOUS | Status: DC | PRN
  Administered 2020-03-24: 100 mg via INTRAVENOUS

## 2020-03-24 MED ORDER — SODIUM CHLORIDE 0.9 % IV SOLN: INTRAVENOUS | Status: DC

## 2020-03-24 MED ORDER — MEPERIDINE HCL 25 MG/ML IJ SOLN: 6.2500 mg | INTRAMUSCULAR | Status: DC | PRN

## 2020-03-24 MED ORDER — LIDOCAINE 2% (20 MG/ML) 5 ML SYRINGE
INTRAMUSCULAR | Status: DC | PRN
  Administered 2020-03-24: 80 mg via INTRAVENOUS

## 2020-03-24 MED ORDER — OXYCODONE HCL 5 MG/5ML PO SOLN: 5.0000 mg | Freq: Once | ORAL | Status: DC | PRN

## 2020-03-24 MED ORDER — METRONIDAZOLE IN NACL 5-0.79 MG/ML-% IV SOLN
500.0000 mg | Freq: Three times a day (TID) | INTRAVENOUS | Status: AC
  Administered 2020-03-24 – 2020-03-25 (×4): 500 mg via INTRAVENOUS
  Filled 2020-03-24 (×4): qty 100

## 2020-03-24 MED ORDER — CEFAZOLIN SODIUM-DEXTROSE 2-4 GM/100ML-% IV SOLN: 2.0000 g | Freq: Four times a day (QID) | INTRAVENOUS | Status: DC

## 2020-03-24 MED ORDER — ONDANSETRON HCL 4 MG/2ML IJ SOLN: 4.0000 mg | Freq: Once | INTRAMUSCULAR | Status: DC | PRN

## 2020-03-24 MED ORDER — CHLORHEXIDINE GLUCONATE 0.12 % MT SOLN
OROMUCOSAL | Status: AC
  Filled 2020-03-24: qty 15

## 2020-03-24 MED ORDER — FENTANYL CITRATE (PF) 100 MCG/2ML IJ SOLN: 25.0000 ug | INTRAMUSCULAR | Status: DC | PRN

## 2020-03-24 SURGICAL SUPPLY — 23 items
BLADE SURG 21 STRL SS (BLADE) ×2 IMPLANT
BNDG COHESIVE 6X5 TAN STRL LF (GAUZE/BANDAGES/DRESSINGS) ×2 IMPLANT
BNDG GAUZE ELAST 4 BULKY (GAUZE/BANDAGES/DRESSINGS) ×3 IMPLANT
COVER SURGICAL LIGHT HANDLE (MISCELLANEOUS) ×3 IMPLANT
DRAPE U-SHAPE 47X51 STRL (DRAPES) ×2 IMPLANT
DRSG ADAPTIC 3X8 NADH LF (GAUZE/BANDAGES/DRESSINGS) ×2 IMPLANT
DRSG EMULSION OIL 3X3 NADH (GAUZE/BANDAGES/DRESSINGS) ×1 IMPLANT
DURAPREP 26ML APPLICATOR (WOUND CARE) ×2 IMPLANT
ELECT REM PT RETURN 9FT ADLT (ELECTROSURGICAL) ×2
ELECTRODE REM PT RTRN 9FT ADLT (ELECTROSURGICAL) IMPLANT
GAUZE SPONGE 4X4 12PLY STRL (GAUZE/BANDAGES/DRESSINGS) ×2 IMPLANT
GLOVE BIOGEL PI IND STRL 9 (GLOVE) ×1 IMPLANT
GLOVE BIOGEL PI INDICATOR 9 (GLOVE) ×1
GLOVE SURG ORTHO 9.0 STRL STRW (GLOVE) ×2 IMPLANT
GOWN STRL REUS W/ TWL XL LVL3 (GOWN DISPOSABLE) ×2 IMPLANT
GOWN STRL REUS W/TWL XL LVL3 (GOWN DISPOSABLE) ×4
KIT BASIN OR (CUSTOM PROCEDURE TRAY) ×2 IMPLANT
KIT TURNOVER KIT B (KITS) ×2 IMPLANT
NS IRRIG 1000ML POUR BTL (IV SOLUTION) ×2 IMPLANT
PACK ORTHO EXTREMITY (CUSTOM PROCEDURE TRAY) ×2 IMPLANT
PAD ABD 8X10 STRL (GAUZE/BANDAGES/DRESSINGS) ×1 IMPLANT
SUT ETHILON 2 0 PSLX (SUTURE) ×2 IMPLANT
TOWEL GREEN STERILE (TOWEL DISPOSABLE) ×2 IMPLANT

## 2020-03-24 NOTE — Progress Notes (Signed)
Orthopedic Tech Progress Note Patient Details:  [name] [birth year] [id number]  Ortho Devices Type of Ortho Device: Postop shoe/boot Ortho Device/Splint Location: RLE Ortho Device/Splint Interventions: Ordered   Post Interventions Patient Tolerated: Well Instructions Provided: Care of device, Adjustment of device, Poper ambulation with device   Shane Badeaux 03/24/2020, 5:01 PM

## 2020-03-24 NOTE — Anesthesia Procedure Notes (Signed)
Procedure Name: LMA Insertion Date/Time: 03/24/2020 11:08 AM Performed by: Rande Brunt, CRNA Pre-anesthesia Checklist: Patient identified, Emergency Drugs available, Suction available and Patient being monitored Patient Re-evaluated:Patient Re-evaluated prior to induction Oxygen Delivery Method: Circle System Utilized Preoxygenation: Pre-oxygenation with 100% oxygen Induction Type: IV induction Ventilation: Mask ventilation without difficulty LMA: LMA inserted LMA Size: 4.0 Number of attempts: 1 Placement Confirmation: positive ETCO2 Tube secured with: Tape Dental Injury: Teeth and Oropharynx as per pre-operative assessment

## 2020-03-24 NOTE — Progress Notes (Addendum)
0955 Pt is A&O x4. NPO post midnight maint. CHG bath completed. Pt to Short stay. 1205 Received pt from PACU, A&O x4. Right foot with coban dressing dry and intact. Pt denies pain at this time.

## 2020-03-24 NOTE — Progress Notes (Signed)
Patient ID: [name], male   DOB: [birth year], 79 y.o.   MRN: [id number]  PROGRESS NOTE    [name]  FGH:[id number] DOB: 05/31/1941 DOA: 03/19/2020 PCP: [name], MD   Brief Narrative:  79 y.o.malewithhistory of previous unspecified stroke, hypertension, diabetes mellitus type II, bilateral transmetatarsal amputation of both feet presented with increasing weakness.  On presentation, WBCs was 21.3.  Urinalysis and chest x-ray were unremarkable.  COVID-19 PCR was negative.  He was found to have open wounds on the sole of both partially amputated feet.  X-ray of the right foot showed findings concerning for osteomyelitis of the medial cuneiform bone.   MRI right foot showed acute osteomyelitis of the medial cuneiform bone.  He was started on broad-spectrum antibiotics.  Orthopedics was consulted.  He was transferred to [facility] on 03/23/2020 for surgical intervention today.  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 -MRI of right foot showed acute osteomyelitis. -Currently on IV vancomycin.   will add cefepime and Flagyl -Dr. Sharol Given is planning for surgical intervention today.  Sepsis: Present on admission -Probably from above.  Improving.  Antibiotic plan as above.  Cultures have been negative so far.  Currently hemodynamically stable.  Generalized weakness -Probably from above.  MRI brain unremarkable. -PT/OT eval after surgical intervention  Acute renal failure -Creatinine improved with IV hydration.  Lisinopril and Metformin on hold.  Diabetes mellitus type 2 -Continue CBGs with SSI.  Metformin on hold  History of unspecified stroke -Continue Plavix and statin.   DVT prophylaxis: Heparin Code Status: DNR Family Communication: None at bedside Disposition Plan: Status is: Inpatient  Remains inpatient appropriate because:Inpatient level of care appropriate due to  severity of illness   Dispo: The patient is from: Home              Anticipated d/c is to: Home              Anticipated d/c date is: > 3 days              Patient currently is not medically stable to d/c.   Consultants: Orthopedic/Dr. Sharol Given  Procedures: None  Antimicrobials:  Anti-infectives (From admission, onward)   Start     Dose/Rate Route Frequency Ordered Stop   03/24/20 1030  ceFAZolin (ANCEF) IVPB 2g/100 mL premix        2 g 200 mL/hr over 30 Minutes Intravenous On call to O.R. 03/23/20 1452 03/25/20 0559   03/22/20 0600  ceFAZolin (ANCEF) 3 g in dextrose 5 % 50 mL IVPB        3 g 100 mL/hr over 30 Minutes Intravenous On call to O.R. 03/21/20 1214 03/23/20 0559   03/20/20 2200  vancomycin (VANCOREADY) IVPB 1500 mg/300 mL        1,500 mg 150 mL/hr over 120 Minutes Intravenous Every 24 hours 03/20/20 1510     03/20/20 1600  vancomycin (VANCOREADY) IVPB 1250 mg/250 mL        1,250 mg 166.7 mL/hr over 90 Minutes Intravenous  Once 03/20/20 1510 03/20/20 1711   03/19/20 2315  cefTRIAXone (ROCEPHIN) 1 g in sodium chloride 0.9 % 100 mL IVPB        1 g 200 mL/hr over 30 Minutes Intravenous  Once 03/19/20 2308 03/20/20 0049       Subjective: Patient seen and examined at bedside.  Denies any overnight fever, vomiting, shortness of breath.  Objective: Vitals:   03/23/20  2341 03/24/20 0449 03/24/20 0500 03/24/20 0810  BP: (!) 104/59 114/68  116/65  Pulse: (!) 56 65  63  Resp: 17     Temp: 98.9 F (37.2 C) 98.5 F (36.9 C)  98.3 F (36.8 C)  TempSrc: Oral Oral  Oral  SpO2: 99% 99%  97%  Weight:   86.3 kg   Height:        Intake/Output Summary (Last 24 hours) at 03/24/2020 0958 Last data filed at 03/24/2020 0807 Gross per 24 hour  Intake 240 ml  Output 1550 ml  Net -1310 ml   Filed Weights   03/22/20 0449 03/23/20 0503 03/24/20 0500  Weight: 83.2 kg 80.7 kg 86.3 kg    Examination:  General exam: No acute distress.  79 male lying on bed. Respiratory  system: Bilateral decreased breath sounds at bases Cardiovascular system: S1 & S2 heard, Rate controlled Gastrointestinal system: Abdomen is nondistended, soft and nontender. Normal bowel sounds heard. Extremities: No cyanosis, clubbing, edema.   Data Reviewed: I have personally reviewed following labs and imaging studies  CBC: Recent Labs  Lab 03/19/20 2157 03/20/20 1002 03/22/20 0029 03/23/20 0440 03/24/20 0609  WBC 21.3* 15.0* 7.1 6.9 7.6  NEUTROABS 19.1*  --  4.3 3.6 4.1  HGB 11.3* 10.4* 9.9* 9.9* 9.8*  HCT 34.4* 32.7* 30.6* 30.8* 30.4*  MCV 88.2 89.3 89.7 89.8 86.6  PLT 241 215 196 192 062   Basic Metabolic Panel: Recent Labs  Lab 03/19/20 2157 03/19/20 2157 03/20/20 1002 03/21/20 0448 03/22/20 0029 03/23/20 0440 03/24/20 0609  NA 136  --   --   --  134* 140 138  K 3.9  --   --   --  3.6 3.4* 3.3*  CL 103  --   --   --  106 110 108  CO2 22  --   --   --  22 20* 21*  GLUCOSE 111*  --   --   --  110* 89 95  BUN 30*  --   --   --  19 12 10   CREATININE 1.39*   < > 1.36* 1.44* 1.37*   1.37* 1.04 1.03  CALCIUM 8.7*  --   --   --  8.0* 8.2* 8.0*   < > = values in this interval not displayed.   GFR: Estimated Creatinine Clearance: 67.9 mL/min (by C-G formula based on SCr of 1.03 mg/dL). Liver Function Tests: Recent Labs  Lab 03/19/20 2157  AST 18  ALT 12  ALKPHOS 40  BILITOT 1.0  PROT 7.6  ALBUMIN 3.5   No results for input(s): LIPASE, AMYLASE in the last 168 hours. No results for input(s): AMMONIA in the last 168 hours. Coagulation Profile: No results for input(s): INR, PROTIME in the last 168 hours. Cardiac Enzymes: Recent Labs  Lab 03/20/20 1002  CKTOTAL 288   BNP (last 3 results) No results for input(s): PROBNP in the last 8760 hours. HbA1C: No results for input(s): HGBA1C in the last 72 hours. CBG: Recent Labs  Lab 03/23/20 0733 03/23/20 1153 03/23/20 1559 03/23/20 2054 03/24/20 0636  GLUCAP 100* 99 123* 92 80   Lipid Profile: No  results for input(s): CHOL, HDL, LDLCALC, TRIG, CHOLHDL, LDLDIRECT in the last 72 hours. Thyroid Function Tests: No results for input(s): TSH, T4TOTAL, FREET4, T3FREE, THYROIDAB in the last 72 hours. Anemia Panel: No results for input(s): VITAMINB12, FOLATE, FERRITIN, TIBC, IRON, RETICCTPCT in the last 72 hours. Sepsis Labs: Recent Labs  Lab 03/19/20 2359 03/22/20  0029  LATICACIDVEN 1.5 0.9    Recent Results (from the past 240 hour(s))  Aerobic Culture (superficial specimen)     Status: None   Collection Time: 03/17/20  8:00 AM   Specimen: Heel  Result Value Ref Range Status   Specimen Description   Final    HEEL LEFT Performed at [facility] [address] [place], [place] [address]    Special Requests   Final    NONE Performed at [facility], [place] [address] [place], [place] [address]    Gram Stain   Final    FEW WBC PRESENT, PREDOMINANTLY PMN FEW GRAM POSITIVE COCCI    Culture   Final    MODERATE METHICILLIN RESISTANT STAPHYLOCOCCUS AUREUS FEW GROUP B STREP(S.AGALACTIAE)ISOLATED TESTING AGAINST S. AGALACTIAE NOT ROUTINELY PERFORMED DUE TO PREDICTABILITY OF AMP/PEN/VAN SUSCEPTIBILITY. Performed at [facility], [place] [address], [place], [place] [address]    Report Status 03/20/2020 FINAL  Final   Organism ID, Bacteria METHICILLIN RESISTANT STAPHYLOCOCCUS AUREUS  Final      Susceptibility   Methicillin resistant staphylococcus aureus - MIC*    CIPROFLOXACIN >=8 RESISTANT Resistant     ERYTHROMYCIN >=8 RESISTANT Resistant     GENTAMICIN <=0.5 SENSITIVE Sensitive     OXACILLIN >=4 RESISTANT Resistant     TETRACYCLINE <=1 SENSITIVE Sensitive     VANCOMYCIN 1 SENSITIVE Sensitive     TRIMETH/SULFA >=320 RESISTANT Resistant     CLINDAMYCIN <=0.25 SENSITIVE Sensitive     RIFAMPIN <=0.5 SENSITIVE Sensitive     Inducible Clindamycin NEGATIVE Sensitive     * MODERATE METHICILLIN RESISTANT STAPHYLOCOCCUS AUREUS  Blood culture  (routine x 2)     Status: None (Preliminary result)   Collection Time: 03/19/20 11:59 PM   Specimen: BLOOD  Result Value Ref Range Status   Specimen Description   Final    BLOOD LEFT ANTECUBITAL Performed at [facility] [address], [place], [place] [address]    Special Requests   Final    BOTTLES DRAWN AEROBIC AND ANAEROBIC Blood Culture adequate volume Performed at [facility] [place] [address] [place], [place] [address]    Culture   Final    NO GROWTH 4 DAYS Performed at [facility], [place] [address], [place], [place] [address]    Report Status PENDING  Incomplete  Blood culture (routine x 2)     Status: None (Preliminary result)   Collection Time: 03/19/20 11:59 PM   Specimen: BLOOD  Result Value Ref Range Status   Specimen Description   Final    BLOOD RIGHT ANTECUBITAL Performed at [facility] [address], [place], [place] [address]    Special Requests   Final    BOTTLES DRAWN AEROBIC ONLY Blood Culture results may not be optimal due to an inadequate volume of blood received in culture bottles Performed at [facility] [address], [place], [place] [address]    Culture   Final    NO GROWTH 4 DAYS Performed at [facility], [place] [address] [place], [place] [address]    Report Status PENDING  Incomplete  Urine culture     Status: Abnormal   Collection Time: 03/19/20 11:59 PM   Specimen: Urine, Random  Result Value Ref Range Status   Specimen Description   Final    URINE, RANDOM Performed at [facility] [address] [place], [place] [address]  Special Requests   Final    NONE Performed at [facility], [place] [address], [place], [place] [address]    Culture (A)  Final    <10,000 COLONIES/mL INSIGNIFICANT GROWTH Performed at [facility] [address], [place], [place] [address]    Report Status 03/21/2020 FINAL   Final  Respiratory Panel by RT PCR (Flu A&B, Covid) - Nasopharyngeal Swab     Status: None   Collection Time: 03/20/20  4:24 AM   Specimen: Nasopharyngeal Swab  Result Value Ref Range Status   SARS Coronavirus 2 by RT PCR NEGATIVE NEGATIVE Final    Comment: (NOTE) SARS-CoV-2 target nucleic acids are NOT DETECTED.  The SARS-CoV-2 RNA is generally detectable in upper respiratoy specimens during the acute phase of infection. The lowest concentration of SARS-CoV-2 viral copies this assay can detect is 131 copies/mL. A negative result does not preclude SARS-Cov-2 infection and should not be used as the sole basis for treatment or other patient management decisions. A negative result may occur with  improper specimen collection/handling, submission of specimen other than nasopharyngeal swab, presence of viral mutation(s) within the areas targeted by this assay, and inadequate number of viral copies (<131 copies/mL). A negative result must be combined with clinical observations, patient history, and epidemiological information. The expected result is Negative.  Fact Sheet for Patients:  PinkCheek.be  Fact Sheet for Healthcare Providers:  GravelBags.it  This test is no t yet approved or cleared by the [place] FDA and  has been authorized for detection and/or diagnosis of SARS-CoV-2 by FDA under an Emergency Use Authorization (EUA). This EUA will remain  in effect (meaning this test can be used) for the duration of the COVID-19 declaration under Section 564(b)(1) of the Act, 21 U.S.C. section 360bbb-3(b)(1), unless the authorization is terminated or revoked sooner.     Influenza A by PCR NEGATIVE NEGATIVE Final   Influenza B by PCR NEGATIVE NEGATIVE Final    Comment: (NOTE) The Xpert Xpress SARS-CoV-2/FLU/RSV assay is intended as an aid in  the diagnosis of influenza from Nasopharyngeal swab specimens and  should not be  used as a sole basis for treatment. Nasal washings and  aspirates are unacceptable for Xpert Xpress SARS-CoV-2/FLU/RSV  testing.  Fact Sheet for Patients: PinkCheek.be  Fact Sheet for Healthcare Providers: GravelBags.it  This test is not yet approved or cleared by the [place] FDA and  has been authorized for detection and/or diagnosis of SARS-CoV-2 by  FDA under an Emergency Use Authorization (EUA). This EUA will remain  in effect (meaning this test can be used) for the duration of the  Covid-19 declaration under Section 564(b)(1) of the Act, 21  U.S.C. section 360bbb-3(b)(1), unless the authorization is  terminated or revoked. Performed at [facility], [place] [address] [place], [place] [address]   Surgical pcr screen     Status: Abnormal   Collection Time: 03/23/20  8:09 PM   Specimen: Nasal Mucosa; Nasal Swab  Result Value Ref Range Status   MRSA, PCR POSITIVE (A) NEGATIVE Final    Comment: RESULT CALLED TO, READ BACK BY AND VERIFIED WITH: Andres Labrum RN 03/23/20 AT 2238 SK    Staphylococcus aureus POSITIVE (A) NEGATIVE Final    Comment: RESULT CALLED TO, READ BACK BY AND VERIFIED WITH: Andres Labrum RN 03/23/20 AT 2238 SK (NOTE) The Xpert SA Assay (FDA approved for NASAL specimens in patients 86 years of age and older), is one component of a comprehensive  surveillance program. It is not intended to diagnose infection nor to guide or monitor treatment. Performed at [facility], [place] [address] [place], [place] [address]          Radiology Studies: No results found.      Scheduled Meds:  atorvastatin  40 mg Oral Daily   clopidogrel  75 mg Oral Q breakfast   cycloSPORINE  1 drop Both Eyes BID   gabapentin  300 mg Oral BID   heparin  5,000 Units Subcutaneous Q8H   insulin aspart  0-9 Units Subcutaneous TID WC   latanoprost  1 drop Both Eyes QHS   mupirocin  ointment  1 application Nasal BID   pregabalin  50 mg Oral BID   timolol  1 drop Both Eyes BID   Continuous Infusions:   ceFAZolin (ANCEF) IV     vancomycin 1,500 mg (03/23/20 2037)          Aline August, MD [facility] 03/24/2020, 9:58 AM

## 2020-03-24 NOTE — Transfer of Care (Signed)
Immediate Anesthesia Transfer of Care Note  Patient: [name]  Procedure(s) Performed: EXCISION RIGHT MEDIAL CUNEFORM (Right Foot)  Patient Location: PACU  Anesthesia Type:General  Level of Consciousness: drowsy and responds to stimulation  Airway & Oxygen Therapy: Patient Spontanous Breathing and Patient connected to face mask oxygen  Post-op Assessment: Report given to RN, Post -op Vital signs reviewed and stable and Patient moving all extremities  Post vital signs: Reviewed and stable  Last Vitals:  Vitals Value Taken Time  BP 125/73 03/24/20 1138  Temp    Pulse 64 03/24/20 1139  Resp 18 03/24/20 1139  SpO2 100 % 03/24/20 1139  Vitals shown include unvalidated device data.  Last Pain:  Vitals:   03/24/20 0810  TempSrc: Oral  PainSc:          Complications: No complications documented.

## 2020-03-24 NOTE — Interval H&P Note (Signed)
History and Physical Interval Note:  03/24/2020 6:47 AM  [name]  has presented today for surgery, with the diagnosis of Osteomyelitis Right Medial Cuneoform.  The various methods of treatment have been discussed with the patient and family. After consideration of risks, benefits and other options for treatment, the patient has consented to  Procedure(s): EXCISION RIGHT MEDIAL CUNEFORM (Right) as a surgical intervention.  The patient's history has been reviewed, patient examined, no change in status, stable for surgery.  I have reviewed the patient's chart and labs.  Questions were answered to the patient's satisfaction.     Newt Minion

## 2020-03-24 NOTE — Anesthesia Postprocedure Evaluation (Signed)
Anesthesia Post Note  Patient: Wash Nienhaus  Procedure(s) Performed: EXCISION RIGHT MEDIAL CUNEFORM (Right Foot)     Patient location during evaluation: PACU Anesthesia Type: General Level of consciousness: awake and alert Pain management: pain level controlled Vital Signs Assessment: post-procedure vital signs reviewed and stable Respiratory status: spontaneous breathing, nonlabored ventilation, respiratory function stable and patient connected to nasal cannula oxygen Cardiovascular status: blood pressure returned to baseline and stable Postop Assessment: no apparent nausea or vomiting Anesthetic complications: no   No complications documented.  Last Vitals:  Vitals:   03/24/20 1209 03/24/20 1300  BP: 122/68 124/70  Pulse: 64 61  Resp: 14 15  Temp: 36.8 C 36.7 C  SpO2: 99% 99%    Last Pain:  Vitals:   03/24/20 1300  TempSrc: Oral  PainSc:                  Daja Shuping

## 2020-03-24 NOTE — Progress Notes (Signed)
Hypoglycemic Event  CBG: 59  Treatment: 4 oz juice/soda  Symptoms: None  Follow-up CBG: Time:1707 CBG Result:62  Possible Reasons for Event: Inadequate meal intake  Comments/MD notified:Dr Alekh notified, pt is A&O x4, uncooperative, refused to eat and drink. D50 25 ml given.  Pt refused for another blood sugar check.    [name] [name]

## 2020-03-24 NOTE — Progress Notes (Signed)
Pharmacy Antibiotic Note  [name] is a 79 y.o. male admitted on 03/19/2020 with osteo.  Pharmacy has been consulted for adding Cefepime dosing.  ID: R foot osteo (note wound Cx from opposite foot) - Hx bilat transmet amputation 2014 - 10/8: Chopart amputation right foot, with excision of the medial cuneiform - Tmax 99.1, WBC 7.6. Scr 1.  Antimicrobials this admission: 10/4 vanc >>  10/8 Cefepime>> 10/8 Flagyl >>  Microbiology results: 10/3 BCx: ngtd 10/3 UCx: <10k insignificant 10/1 L heel wound (superficial I&D done at [facility]): MRSA, GBS, [place] note also mentions Proteus on a previous Cx  Plan: Vancomycin 1500mg  IV q24h (since 10/4). Trough tonight Cefepime 2g IV q8hr.    Height: 6\' 1"  (185.4 cm) Weight: 86.3 kg (190 lb 4.1 oz) IBW/kg (Calculated) : 79.9  Temp (24hrs), Avg:98.4 F (36.9 C), Min:97.4 F (36.3 C), Max:99.1 F (37.3 C)  Recent Labs  Lab 03/19/20 2157 03/19/20 2157 03/19/20 2359 03/20/20 1002 03/21/20 0448 03/22/20 0029 03/23/20 0440 03/24/20 0609  WBC 21.3*  --   --  15.0*  --  7.1 6.9 7.6  CREATININE 1.39*   < >  --  1.36* 1.44* 1.37*  1.37* 1.04 1.03  LATICACIDVEN  --   --  1.5  --   --  0.9  --   --    < > = values in this interval not displayed.    Estimated Creatinine Clearance: 67.9 mL/min (by C-G formula based on SCr of 1.03 mg/dL).    No Known Allergies  [name], PharmD, BCPS Clinical Staff Pharmacist [email or web address]  [name] 03/24/2020 12:43 PM

## 2020-03-24 NOTE — Anesthesia Preprocedure Evaluation (Addendum)
Anesthesia Evaluation  Patient identified by MRN, date of birth, ID band Patient awake    Reviewed: Allergy & Precautions, NPO status , Patient's Chart, lab work & pertinent test results  History of Anesthesia Complications Negative for: history of anesthetic complications  Airway Mallampati: I  TM Distance: >3 FB Neck ROM: Full    Dental  (+) Dental Advisory Given, Missing, Edentulous Upper, Poor Dentition, Chipped,    Pulmonary Current Smoker and Patient abstained from smoking.,    breath sounds clear to auscultation       Cardiovascular hypertension, (-) angina+ Past MI and + Peripheral Vascular Disease   Rhythm:Regular Rate:Normal  ECHO 5/20 Left Ventricle: The left ventricle has low normal systolic function, with  an ejection fraction of 50-55%. The cavity size was normal. There is  severe concentric left ventricular hypertrophy. Left ventricular diastolic  Doppler parameters are consistent  with restrictive filling. Elevated left ventricular end-diastolic pressure    Neuro/Psych  Headaches, negative psych ROS   GI/Hepatic negative GI ROS, Neg liver ROS,   Endo/Other  diabetes, Type 2, Oral Hypoglycemic Agents  Renal/GU Renal disease  negative genitourinary   Musculoskeletal  (+) Arthritis , Osteoarthritis,    Abdominal   Peds  Hematology negative hematology ROS (+)   Anesthesia Other Findings   Reproductive/Obstetrics                           Anesthesia Physical  Anesthesia Plan  ASA: III  Anesthesia Plan: General   Post-op Pain Management:    Induction: Intravenous  PONV Risk Score and Plan: 2 and Treatment may vary due to age or medical condition and Ondansetron  Airway Management Planned: LMA and Oral ETT  Additional Equipment: None  Intra-op Plan:   Post-operative Plan: Extubation in OR  Informed Consent: I have reviewed the patients History and Physical, chart,  labs and discussed the procedure including the risks, benefits and alternatives for the proposed anesthesia with the patient or authorized representative who has indicated his/her understanding and acceptance.     Dental advisory given  Plan Discussed with: CRNA and Anesthesiologist  Anesthesia Plan Comments: ( )      Anesthesia Quick Evaluation

## 2020-03-24 NOTE — Op Note (Addendum)
03/24/2020  11:35 AM  PATIENT:  [name]    PRE-OPERATIVE DIAGNOSIS:  Osteomyelitis Right Medial Cuneoform  POST-OPERATIVE DIAGNOSIS:  Same  PROCEDURE: Chopart amputation right foot, with excision of the medial cuneiform  SURGEON:  Newt Minion, MD  PHYSICIAN ASSISTANT:None ANESTHESIA:   General  PREOPERATIVE INDICATIONS:  [name] is a  79 y.o. male with a diagnosis of Osteomyelitis Right Medial Cuneoform who failed conservative measures and elected for surgical management.    The risks benefits and alternatives were discussed with the patient preoperatively including but not limited to the risks of infection, bleeding, nerve injury, cardiopulmonary complications, the need for revision surgery, among others, and the patient was willing to proceed.  OPERATIVE IMPLANTS: None  @ENCIMAGES @  OPERATIVE FINDINGS: No deep abscess good petechial bleeding  OPERATIVE PROCEDURE: Patient was brought the operating room after undergoing a regional anesthetic the right lower extremity was prepped using DuraPrep draped into a sterile field a timeout was called.  An incision was made longitudinally along the previous incision line.  A Chopart amputation was performed with excision of the medial cuneiform.  Electrocautery was used hemostasis the wound was irrigated with normal saline there was no deep abscess.  The ulcer on the plantar side of the foot was also sharply debrided with a rondure.  The wound was closed using 2-0 nylon sterile dressing was applied patient was taken the PACU in stable condition.   DISCHARGE PLANNING:  Antibiotic duration: Continue antibiotics for 24 hours  Weightbearing: Touchdown weightbearing on the right  Pain medication: Opioid pathway  Dressing care/ Wound VAC: Reinforce dressing as needed dry dressing  Ambulatory devices: Walker  Discharge to: Anticipate discharge based on recommendations by therapy  Follow-up: In the office 1 week post  operative.

## 2020-03-25 ENCOUNTER — Encounter (HOSPITAL_COMMUNITY): Payer: Self-pay | Admitting: Orthopedic Surgery

## 2020-03-25 DIAGNOSIS — G9340 Encephalopathy, unspecified: Secondary | ICD-10-CM

## 2020-03-25 LAB — CBC WITH DIFFERENTIAL/PLATELET
Abs Immature Granulocytes: 0.08 10*3/uL — ABNORMAL HIGH (ref 0.00–0.07)
Basophils Absolute: 0 10*3/uL (ref 0.0–0.1)
Basophils Relative: 0 %
Eosinophils Absolute: 0.8 10*3/uL — ABNORMAL HIGH (ref 0.0–0.5)
Eosinophils Relative: 10 %
HCT: 26 % — ABNORMAL LOW (ref 39.0–52.0)
Hemoglobin: 8.3 g/dL — ABNORMAL LOW (ref 13.0–17.0)
Immature Granulocytes: 1 %
Lymphocytes Relative: 14 %
Lymphs Abs: 1.2 10*3/uL (ref 0.7–4.0)
MCH: 27.8 pg (ref 26.0–34.0)
MCHC: 31.9 g/dL (ref 30.0–36.0)
MCV: 87 fL (ref 80.0–100.0)
Monocytes Absolute: 0.7 10*3/uL (ref 0.1–1.0)
Monocytes Relative: 9 %
Neutro Abs: 5.3 10*3/uL (ref 1.7–7.7)
Neutrophils Relative %: 66 %
Platelets: 203 10*3/uL (ref 150–400)
RBC: 2.99 MIL/uL — ABNORMAL LOW (ref 4.22–5.81)
RDW: 16.4 % — ABNORMAL HIGH (ref 11.5–15.5)
WBC: 8.2 10*3/uL (ref 4.0–10.5)
nRBC: 0 % (ref 0.0–0.2)

## 2020-03-25 LAB — CULTURE, BLOOD (ROUTINE X 2)
Culture: NO GROWTH
Culture: NO GROWTH
Special Requests: ADEQUATE

## 2020-03-25 LAB — BASIC METABOLIC PANEL
Anion gap: 8 (ref 5–15)
BUN: 10 mg/dL (ref 8–23)
CO2: 20 mmol/L — ABNORMAL LOW (ref 22–32)
Calcium: 7.2 mg/dL — ABNORMAL LOW (ref 8.9–10.3)
Chloride: 110 mmol/L (ref 98–111)
Creatinine, Ser: 1.05 mg/dL (ref 0.61–1.24)
GFR, Estimated: >60 mL/min (ref >60)
Glucose, Bld: 102 mg/dL — ABNORMAL HIGH (ref 70–99)
Potassium: 3.1 mmol/L — ABNORMAL LOW (ref 3.5–5.1)
Sodium: 138 mmol/L (ref 135–145)

## 2020-03-25 LAB — GLUCOSE, CAPILLARY
Glucose-Capillary: 92 mg/dL (ref 70–99)
Glucose-Capillary: 87 mg/dL (ref 70–99)
Glucose-Capillary: 84 mg/dL (ref 70–99)
Glucose-Capillary: 99 mg/dL (ref 70–99)

## 2020-03-25 LAB — C-REACTIVE PROTEIN: CRP: 13.5 mg/dL — ABNORMAL HIGH (ref <1.0)

## 2020-03-25 LAB — MAGNESIUM: Magnesium: 1.4 mg/dL — ABNORMAL LOW (ref 1.7–2.4)

## 2020-03-25 NOTE — Evaluation (Signed)
Occupational Therapy Evaluation Patient Details Name: [name] MRN: [id number] DOB: 11-11-1940 Today's Date: 03/25/2020    History of Present Illness Pt is a 79 y.o. 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   Clinical Impression   PTA, pt lives alone and reports Modified Independence with ADLs and mobility using cane or walker. Pt receives assistance with IADLs from family. Pt presents now s/p surgery with significant balance deficits, as well as decreased strength, endurance, and safety awareness. Pt with poor adherence to TDWB R LE precautions, becoming agitated with verbal reminders during session. Pt overall Mod A for sit to stand with RW and side steps at EOB requiring physical assist to maintain balance. Pt requires Supervision for UB ADLs and Mod A for LB ADLs. Based on current functional abilities, recommend SNF for short term rehab and 24/7 supervision. If pt refuses SNF, recommend HHOT/PT, HH aide, 24/7 supervision, and discharge at wheelchair level.     Follow Up Recommendations  SNF;Supervision/Assistance - 24 hour (HHOT and max HH services if pt refuses SNF)    Equipment Recommendations  Wheelchair (measurements OT);Wheelchair cushion (measurements OT);3 in 1 bedside commode    Recommendations for Other Services       Precautions / Restrictions Precautions Precautions: Fall Required Braces or Orthoses: Other Brace Other Brace: right post op shoe Restrictions Weight Bearing Restrictions: Yes RLE Weight Bearing: Touchdown weight bearing      Mobility Bed Mobility Overal bed mobility: Needs Assistance Bed Mobility: Supine to Sit;Sit to Supine     Supine to sit: Min assist Sit to supine: Min assist   General bed mobility comments: MinA for pulling trunk  to upright position, minA for LE negotiation back into bed  Transfers Overall transfer level: Needs assistance Equipment used: Rolling walker (2 wheeled) Transfers: Sit to/from Stand Sit to Stand: Mod assist         General transfer comment: Mod A for sit to stand at bedside, posterior lean noted. Requires external support to maintain balance, placing full weight on affected LE despite cues. Deferred further mobility due to poor following of precautions    Balance Overall balance assessment: Needs assistance Sitting-balance support: Feet unsupported Sitting balance-Leahy Scale: Fair Sitting balance - Comments: fair static sitting, some unsteadiness   Standing balance support: Bilateral upper extremity supported Standing balance-Leahy Scale: Poor Standing balance comment: reliant on UE support on RW and external support from therapist                           ADL either performed or assessed with clinical judgement   ADL Overall ADL's : Needs assistance/impaired Eating/Feeding: Set up;Sitting Eating/Feeding Details (indicate cue type and reason): setup to cut up food, place straw in cup (difficulty seeing without glasses) Grooming: Set up;Sitting   Upper Body Bathing: Supervision/ safety;Sitting   Lower Body Bathing: Moderate assistance;Sit to/from stand   Upper Body Dressing : Supervision/safety;Sitting   Lower Body Dressing: Maximal assistance;Sit to/from stand;Sitting/lateral leans Lower Body Dressing Details (indicate cue type and reason): Unable to don post op shoe without Max A. Impaired balance in standing      Toileting- Clothing Manipulation and Hygiene: Sit to/from stand;Sitting/lateral lean;Moderate assistance         General ADL Comments: Pt with major deficits in standing balance, safety awareness and adherence  to WB precautions     Vision Baseline Vision/History: Wears glasses Wears Glasses: At all times Patient Visual Report: No change  from baseline Vision Assessment?: No apparent visual deficits     Perception     Praxis      Pertinent Vitals/Pain Pain Assessment: Faces Faces Pain Scale: Hurts little more Pain Location: surgical site Pain Descriptors / Indicators: Burning;Aching;Sore Pain Intervention(s): Monitored during session;Limited activity within patient's tolerance;Patient requesting pain meds-RN notified;Repositioned     Hand Dominance Right   Extremity/Trunk Assessment Upper Extremity Assessment Upper Extremity Assessment: Generalized weakness   Lower Extremity Assessment Lower Extremity Assessment: Defer to PT evaluation RLE Deficits / Details: prior right TMA, now s/p chopart amputation.  LLE Deficits / Details: prior TMA   Cervical / Trunk Assessment Cervical / Trunk Assessment: Normal   Communication Communication Communication: No difficulties   Cognition Arousal/Alertness: Awake/alert Behavior During Therapy: Flat affect Overall Cognitive Status: Impaired/Different from baseline Area of Impairment: Safety/judgement;Problem solving;Awareness                         Safety/Judgement: Decreased awareness of safety;Decreased awareness of deficits Awareness: Emergent Problem Solving: Difficulty sequencing;Requires verbal cues General Comments: Pt with poor awareness of deficits and recall/carryover of precautions. Pt agitated with frequent cues to avoid full WB through affected LE. Exclaims, "well how am i gonna walk?"   General Comments       Exercises     Shoulder Instructions      Home Living Family/patient expects to be discharged to:: Private residence Living Arrangements: Alone Available Help at Discharge: Family;Available PRN/intermittently (2 nieces) Type of Home: Apartment Home Access: Level entry     Home Layout: One level     Bathroom Shower/Tub: Walk-in shower         Home Equipment: Environmental consultant - 2 wheels;Shower seat;Cane - single point   Additional  Comments: ```      Prior Functioning/Environment Level of Independence: Needs assistance  Gait / Transfers Assistance Needed: using walker, cane for mobility  ADL's / Homemaking Assistance Needed: independent ADL's, assist for IADL's            OT Problem List: Decreased strength;Decreased activity tolerance;Impaired balance (sitting and/or standing);Decreased coordination;Decreased cognition;Decreased safety awareness;Decreased knowledge of use of DME or AE;Decreased knowledge of precautions;Pain      OT Treatment/Interventions: Self-care/ADL training;Therapeutic exercise;DME and/or AE instruction;Therapeutic activities;Patient/family education;Balance training    OT Goals(Current goals can be found in the care plan section) Acute Rehab OT Goals Patient Stated Goal: go home OT Goal Formulation: With patient Time For Goal Achievement: 04/08/20 Potential to Achieve Goals: Good ADL Goals Pt Will Perform Grooming: with min assist;standing Pt Will Perform Lower Body Bathing: with min assist;sit to/from stand;sitting/lateral leans Pt Will Perform Lower Body Dressing: with min assist;sit to/from stand;sitting/lateral leans Pt Will Transfer to Toilet: with min assist;stand pivot transfer;bedside commode Pt Will Perform Toileting - Clothing Manipulation and hygiene: with min assist;sit to/from stand;sitting/lateral leans Pt/caregiver will Perform Home Exercise Program: Increased strength;Both right and left upper extremity;With theraband;Independently;With written HEP provided  OT Frequency: Min 2X/week   Barriers to D/C:            Co-evaluation              AM-PAC OT "6 Clicks" Daily Activity     Outcome Measure Help from another person eating meals?: A Little Help from another person taking care of personal grooming?: A Little Help from another person toileting,  which includes using toliet, bedpan, or urinal?: A Lot Help from another person bathing (including washing,  rinsing, drying)?: A Lot Help from another person to put on and taking off regular upper body clothing?: A Little Help from another person to put on and taking off regular lower body clothing?: A Lot 6 Click Score: 15   End of Session Equipment Utilized During Treatment: Gait belt;Rolling walker;Other (comment) (post op shoe) Nurse Communication: Mobility status  Activity Tolerance: Other (comment) (limited by poor adherence to WB precautions) Patient left: in bed;with call bell/phone within reach;with bed alarm set  OT Visit Diagnosis: Unsteadiness on feet (R26.81);Other abnormalities of gait and mobility (R26.89);Muscle weakness (generalized) (M62.81);Other symptoms and signs involving cognitive function;Pain Pain - Right/Left: Right Pain - part of body: Ankle and joints of foot                Time: 1008-1030 OT Time Calculation (min): 22 min Charges:  OT General Charges $OT Visit: 1 Visit OT Evaluation $OT Eval Moderate Complexity: 1 Mod  Layla Maw, OTR/L  Layla Maw 03/25/2020, 10:45 AM

## 2020-03-25 NOTE — Evaluation (Signed)
Physical Therapy Evaluation Patient Details Name: [name] MRN: [id number] DOB: [birth year] Today's Date: 03/25/2020   History of Present Illness  Pt is a 79 y.o. 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  Clinical Impression  Prior to admission, pt lives alone in an apartment, uses walker for mobility, and has intermittent assist from niece for IADL's. Pt presents with decreased functional mobility secondary to difficulty maintaining weightbearing precautions, pain, poor standing balance, weakness. Pt requiring moderate assist to stand up to a walker and ambulate limited room distance. Pt unable to maintain weightbearing precautions. Presents as a high fall risk based on deficits listed above and decreased safety awareness. See below for recommendations.     Follow Up Recommendations SNF;Supervision/Assistance - 24 hour (if pt refuses, needs HHPT/OT)    Equipment Recommendations  3in1 (PT);Wheelchair (measurements PT);Wheelchair cushion (measurements PT)    Recommendations for Other Services       Precautions / Restrictions Precautions Precautions: Fall Required Braces or Orthoses: Other Brace Other Brace: right post op shoe Restrictions Weight Bearing Restrictions: Yes RLE Weight Bearing: Touchdown weight bearing      Mobility  Bed Mobility Overal bed mobility: Needs Assistance Bed Mobility: Supine to Sit;Sit to Supine     Supine to sit: Min assist Sit to supine: Min assist   General bed mobility comments: MinA for pulling trunk to upright position, minA for LE negotiation back into bed  Transfers Overall transfer level: Needs assistance Equipment used: Rolling walker (2 wheeled) Transfers: Sit to/from Stand Sit to Stand: Mod assist         General  transfer comment: ModA to rise from edge of bed and initially steady. Cues for placing RLE anteriorly to prevent weightbearing  Ambulation/Gait Ambulation/Gait assistance: Mod assist Gait Distance (Feet): 5 Feet Assistive device: Rolling walker (2 wheeled) Gait Pattern/deviations: Step-through pattern;Decreased weight shift to right;Antalgic Gait velocity: decreased Gait velocity interpretation: <1.31 ft/sec, indicative of household ambulator General Gait Details: Max cues for weightbearing status and walker proximity; pt unable to maintain. ModA for stability  Stairs            Wheelchair Mobility    Modified Rankin (Stroke Patients Only)       Balance Overall balance assessment: Needs assistance Sitting-balance support: Feet unsupported Sitting balance-Leahy Scale: Good     Standing balance support: Bilateral upper extremity supported Standing balance-Leahy Scale: Poor                               Pertinent Vitals/Pain Pain Assessment: Faces Faces Pain Scale: Hurts little more Pain Location: surgical site Pain Descriptors / Indicators: Sore Pain Intervention(s): Limited activity within patient's tolerance;Monitored during session    Home Living Family/patient expects to be discharged to:: Private residence Living Arrangements: Alone Available Help at Discharge: Family;Available PRN/intermittently (2 nieces) Type of Home: Apartment Home Access: Level entry     Home Layout: One level Home Equipment: Walker - 2 wheels;Shower seat;Cane - single point      Prior Function Level of Independence: Needs assistance   Gait / Transfers Assistance Needed: using walker  ADL's / Homemaking Assistance Needed: independent ADL's, assist for IADL's        Hand Dominance        Extremity/Trunk Assessment   Upper Extremity Assessment Upper Extremity  Assessment: Defer to OT evaluation    Lower Extremity Assessment Lower Extremity Assessment:  Generalized weakness;RLE deficits/detail;LLE deficits/detail RLE Deficits / Details: prior right TMA, now s/p chopart amputation.  LLE Deficits / Details: prior TMA       Communication   Communication: No difficulties  Cognition Arousal/Alertness: Awake/alert Behavior During Therapy: WFL for tasks assessed/performed Overall Cognitive Status: Impaired/Different from baseline Area of Impairment: Safety/judgement;Problem solving                         Safety/Judgement: Decreased awareness of safety;Decreased awareness of deficits   Problem Solving: Difficulty sequencing;Requires verbal cues General Comments: Pt with poor awareness of deficits and recall/carryover of precautions      General Comments      Exercises     Assessment/Plan    PT Assessment Patient needs continued PT services  PT Problem List Decreased strength;Decreased activity tolerance;Decreased balance;Decreased mobility;Decreased safety awareness;Decreased cognition;Pain       PT Treatment Interventions DME instruction;Gait training;Functional mobility training;Therapeutic activities;Therapeutic exercise;Balance training;Patient/family education;Wheelchair mobility training    PT Goals (Current goals can be found in the Care Plan section)  Acute Rehab PT Goals Patient Stated Goal: go home PT Goal Formulation: With patient Time For Goal Achievement: 04/08/20 Potential to Achieve Goals: Fair    Frequency Min 3X/week   Barriers to discharge        Co-evaluation               AM-PAC PT "6 Clicks" Mobility  Outcome Measure Help needed turning from your back to your side while in a flat bed without using bedrails?: None Help needed moving from lying on your back to sitting on the side of a flat bed without using bedrails?: A Little Help needed moving to and from a bed to a chair (including a wheelchair)?: A Lot Help needed standing up from a chair using your arms (e.g., wheelchair or  bedside chair)?: A Lot Help needed to walk in hospital room?: A Lot Help needed climbing 3-5 steps with a railing? : Total 6 Click Score: 14    End of Session Equipment Utilized During Treatment: Gait belt;Other (comment) (post op shoe) Activity Tolerance: Patient tolerated treatment well Patient left: in bed;with call bell/phone within reach;with bed alarm set Nurse Communication: Other (comment) (notified MD of recommendation) PT Visit Diagnosis: Unsteadiness on feet (R26.81);Other abnormalities of gait and mobility (R26.89);Difficulty in walking, not elsewhere classified (R26.2);Pain Pain - Right/Left: Left Pain - part of body: Ankle and joints of foot    Time: 1275-1700 PT Time Calculation (min) (ACUTE ONLY): 30 min   Charges:   PT Evaluation $PT Eval Moderate Complexity: 1 Mod PT Treatments $Gait Training: 8-22 mins        Wyona Almas, PT, DPT Acute Rehabilitation Services Pager [phone] Office [phone]   Deno Etienne 03/25/2020, 9:54 AM

## 2020-03-25 NOTE — Plan of Care (Addendum)
Dressing to L foot changed per order. Pt tolerated well, denies pain 0/10.  Problem: Clinical Measurements: Goal: Ability to maintain clinical measurements within normal limits will improve Outcome: Progressing   Problem: Activity: Goal: Risk for activity intolerance will decrease Outcome: Progressing   Problem: Nutrition: Goal: Adequate nutrition will be maintained Outcome: Progressing   Problem: Elimination: Goal: Will not experience complications related to bowel motility Outcome: Progressing   Problem: Pain Managment: Goal: General experience of comfort will improve Outcome: Progressing   Problem: Safety: Goal: Ability to remain free from injury will improve Outcome: Progressing   Problem: Skin Integrity: Goal: Risk for impaired skin integrity will decrease Outcome: Progressing

## 2020-03-25 NOTE — Progress Notes (Signed)
Patient ID: [name], male   DOB: [birth year], 79 y.o.   MRN: [id number] Patient is postoperative day one excision medial cuneiform show part amputation.  Patient states he was able to walk this morning.  I feel the patient may be safe for discharge tomorrow Sunday if he does well with therapy.  The dressing is clean and dry.

## 2020-03-25 NOTE — Progress Notes (Signed)
Patient ID: [name], male   DOB: 11-27-1940, 79 y.o.   MRN: [id number]  PROGRESS NOTE    [name]  WUJ:[id number] DOB: [birth year] DOA: 03/19/2020 PCP: [name], MD   Brief Narrative:  79 y.o.malewithhistory of previous unspecified stroke, hypertension, diabetes mellitus type II, bilateral transmetatarsal amputation of both feet presented with increasing weakness.  On presentation, WBCs was 21.3.  Urinalysis and chest x-ray were unremarkable.  COVID-19 PCR was negative.  He was found to have open wounds on the sole of both partially amputated feet.  X-ray of the right foot showed findings concerning for osteomyelitis of the medial cuneiform bone.   MRI right foot showed acute osteomyelitis of the medial cuneiform bone.  He was started on broad-spectrum antibiotics.  Orthopedics was consulted.  He was transferred to [facility] on 03/23/2020 for surgical intervention today.  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 -MRI of right foot showed acute osteomyelitis. -Currently on IV vancomycin, cefepime and Flagyl. -Status post Chopart amputation of right foot, with excision of the medial cuneiform on 03/24/2020 by Dr. Sharol Given.  DC antibiotics 24 hours after procedure, which will be today.  Sepsis: Present on admission -Probably from above.  Improving.  Antibiotic plan as above.  Cultures have been negative so far.  Currently hemodynamically stable.  Generalized weakness -Probably from above.  MRI brain unremarkable. -PT/OT eval after surgical intervention  Acute renal failure -Creatinine improved with IV hydration.  Lisinopril and Metformin on hold.  Diabetes mellitus type 2 -Continue CBGs with SSI.  Metformin on hold  History of unspecified stroke -Continue Plavix and statin.   DVT prophylaxis: Heparin Code Status: DNR Family Communication: None at bedside Disposition  Plan: Status is: Inpatient  Remains inpatient appropriate because:Inpatient level of care appropriate due to severity of illness.  Home versus SNF, pending PT eval   Dispo: The patient is from: Home              Anticipated d/c is to: Home versus SNF              Anticipated d/c date is: 2 days              Patient currently is not medically stable to d/c.   Consultants: Orthopedic/Dr. Sharol Given  Procedures: Chopart amputation of right foot, with excision of the medial cuneiform on 03/24/2020 by Dr. Sharol Given Antimicrobials:  Anti-infectives (From admission, onward)   Start     Dose/Rate Route Frequency Ordered Stop   03/24/20 1400  ceFEPIme (MAXIPIME) 2 g in sodium chloride 0.9 % 100 mL IVPB        2 g 200 mL/hr over 30 Minutes Intravenous Every 8 hours 03/24/20 1241     03/24/20 1215  ceFAZolin (ANCEF) IVPB 2g/100 mL premix  Status:  Discontinued        2 g 200 mL/hr over 30 Minutes Intravenous Every 6 hours 03/24/20 1207 03/24/20 1241   03/24/20 1100  metroNIDAZOLE (FLAGYL) IVPB 500 mg        500 mg 100 mL/hr over 60 Minutes Intravenous Every 8 hours 03/24/20 1007     03/24/20 1030  ceFAZolin (ANCEF) IVPB 2g/100 mL premix  Status:  Discontinued        2 g 200 mL/hr over 30 Minutes Intravenous On call to O.R. 03/23/20 1452 03/24/20 1206   03/22/20 0600  ceFAZolin (ANCEF) 3 g in dextrose 5 % 50 mL IVPB  3 g 100 mL/hr over 30 Minutes Intravenous On call to O.R. 03/21/20 1214 03/23/20 0559   03/20/20 2200  vancomycin (VANCOREADY) IVPB 1500 mg/300 mL        1,500 mg 150 mL/hr over 120 Minutes Intravenous Every 24 hours 03/20/20 1510     03/20/20 1600  vancomycin (VANCOREADY) IVPB 1250 mg/250 mL        1,250 mg 166.7 mL/hr over 90 Minutes Intravenous  Once 03/20/20 1510 03/20/20 1711   03/19/20 2315  cefTRIAXone (ROCEPHIN) 1 g in sodium chloride 0.9 % 100 mL IVPB        1 g 200 mL/hr over 30 Minutes Intravenous  Once 03/19/20 2308 03/20/20 0049       Subjective: Patient seen  and examined at bedside.  Denies worsening foot pain.  No overnight fever, nausea or vomiting. Objective: Vitals:   03/24/20 1300 03/24/20 1949 03/25/20 0512 03/25/20 0743  BP: 124/70 (!) 109/51 (!) 108/58 (!) 105/55  Pulse: 61 87 64 61  Resp: 15 17 18 14   Temp: 98 F (36.7 C) 98 F (36.7 C) 98.6 F (37 C) 99.2 F (37.3 C)  TempSrc: Oral Oral Oral Oral  SpO2: 99% 99% 99% 97%  Weight:      Height:        Intake/Output Summary (Last 24 hours) at 03/25/2020 1016 Last data filed at 03/25/2020 0935 Gross per 24 hour  Intake 2150 ml  Output 1155 ml  Net 995 ml   Filed Weights   03/23/20 0503 03/24/20 0500 03/24/20 1014  Weight: 80.7 kg 86.3 kg 86.3 kg    Examination:  General exam: No distress.  Poor historian.  Elderly male lying on bed. Respiratory system: Bilateral decreased breath sounds at bases with some scattered crackles Cardiovascular system: Rate controlled, S1-S2 heard Gastrointestinal system: Abdomen is nondistended, soft and nontender.  Bowel sounds are heard  extremities: No cyanosis, clubbing, edema.  Right foot dressing present   Data Reviewed: I have personally reviewed following labs and imaging studies  CBC: Recent Labs  Lab 03/19/20 2157 03/19/20 2157 03/20/20 1002 03/22/20 0029 03/23/20 0440 03/24/20 0609 03/25/20 0248  WBC 21.3*   < > 15.0* 7.1 6.9 7.6 8.2  NEUTROABS 19.1*  --   --  4.3 3.6 4.1 5.3  HGB 11.3*   < > 10.4* 9.9* 9.9* 9.8* 8.3*  HCT 34.4*   < > 32.7* 30.6* 30.8* 30.4* 26.0*  MCV 88.2   < > 89.3 89.7 89.8 86.6 87.0  PLT 241   < > 215 196 192 200 203   < > = values in this interval not displayed.   Basic Metabolic Panel: Recent Labs  Lab 03/19/20 2157 03/20/20 1002 03/21/20 0448 03/22/20 0029 03/23/20 0440 03/24/20 0609 03/25/20 0248  NA 136  --   --  134* 140 138 138  K 3.9  --   --  3.6 3.4* 3.3* 3.1*  CL 103  --   --  106 110 108 110  CO2 22  --   --  22 20* 21* 20*  GLUCOSE 111*  --   --  110* 89 95 102*  BUN 30*   --   --  19 12 10 10   CREATININE 1.39*   < > 1.44* 1.37*  1.37* 1.04 1.03 1.05  CALCIUM 8.7*  --   --  8.0* 8.2* 8.0* 7.2*  MG  --   --   --   --   --   --  1.4*   < > =  values in this interval not displayed.   GFR: Estimated Creatinine Clearance: 66.6 mL/min (by C-G formula based on SCr of 1.05 mg/dL). Liver Function Tests: Recent Labs  Lab 03/19/20 2157  AST 18  ALT 12  ALKPHOS 40  BILITOT 1.0  PROT 7.6  ALBUMIN 3.5   No results for input(s): LIPASE, AMYLASE in the last 168 hours. No results for input(s): AMMONIA in the last 168 hours. Coagulation Profile: No results for input(s): INR, PROTIME in the last 168 hours. Cardiac Enzymes: Recent Labs  Lab 03/20/20 1002  CKTOTAL 288   BNP (last 3 results) No results for input(s): PROBNP in the last 8760 hours. HbA1C: No results for input(s): HGBA1C in the last 72 hours. CBG: Recent Labs  Lab 03/24/20 1249 03/24/20 1627 03/24/20 1707 03/24/20 2015 03/25/20 0633  GLUCAP 89 59* 62* 127* 92   Lipid Profile: No results for input(s): CHOL, HDL, LDLCALC, TRIG, CHOLHDL, LDLDIRECT in the last 72 hours. Thyroid Function Tests: No results for input(s): TSH, T4TOTAL, FREET4, T3FREE, THYROIDAB in the last 72 hours. Anemia Panel: No results for input(s): VITAMINB12, FOLATE, FERRITIN, TIBC, IRON, RETICCTPCT in the last 72 hours. Sepsis Labs: Recent Labs  Lab 03/19/20 2359 03/22/20 0029  LATICACIDVEN 1.5 0.9    Recent Results (from the past 240 hour(s))  Aerobic Culture (superficial specimen)     Status: None   Collection Time: 03/17/20  8:00 AM   Specimen: Heel  Result Value Ref Range Status   Specimen Description   Final    HEEL LEFT Performed at [facility] [address], [place], [place] [address]    Special Requests   Final    NONE Performed at [facility], [place] [address] [place], [place] [address]    Gram Stain   Final    FEW WBC PRESENT, PREDOMINANTLY PMN FEW GRAM  POSITIVE COCCI    Culture   Final    MODERATE METHICILLIN RESISTANT STAPHYLOCOCCUS AUREUS FEW GROUP B STREP(S.AGALACTIAE)ISOLATED TESTING AGAINST S. AGALACTIAE NOT ROUTINELY PERFORMED DUE TO PREDICTABILITY OF AMP/PEN/VAN SUSCEPTIBILITY. Performed at [facility], [place] [address] [place], [place] [address]    Report Status 03/20/2020 FINAL  Final   Organism ID, Bacteria METHICILLIN RESISTANT STAPHYLOCOCCUS AUREUS  Final      Susceptibility   Methicillin resistant staphylococcus aureus - MIC*    CIPROFLOXACIN >=8 RESISTANT Resistant     ERYTHROMYCIN >=8 RESISTANT Resistant     GENTAMICIN <=0.5 SENSITIVE Sensitive     OXACILLIN >=4 RESISTANT Resistant     TETRACYCLINE <=1 SENSITIVE Sensitive     VANCOMYCIN 1 SENSITIVE Sensitive     TRIMETH/SULFA >=320 RESISTANT Resistant     CLINDAMYCIN <=0.25 SENSITIVE Sensitive     RIFAMPIN <=0.5 SENSITIVE Sensitive     Inducible Clindamycin NEGATIVE Sensitive     * MODERATE METHICILLIN RESISTANT STAPHYLOCOCCUS AUREUS  Blood culture (routine x 2)     Status: None   Collection Time: 03/19/20 11:59 PM   Specimen: BLOOD  Result Value Ref Range Status   Specimen Description   Final    BLOOD LEFT ANTECUBITAL Performed at [place] [facility] [address] [place], [place] [address]    Special Requests   Final    BOTTLES DRAWN AEROBIC AND ANAEROBIC Blood Culture adequate volume Performed at [facility] [address] [place], [place] [address]    Culture   Final    NO GROWTH 5 DAYS Performed at [facility], [address] [place], [place] [address]    Report Status 03/25/2020 FINAL  Final  Blood culture (routine x 2)     Status: None   Collection Time: 03/19/20 11:59 PM   Specimen: BLOOD  Result Value Ref Range Status   Specimen Description   Final    BLOOD RIGHT ANTECUBITAL Performed at [facility] [place] [address] [place], [place] [address]    Special Requests   Final     BOTTLES DRAWN AEROBIC ONLY Blood Culture results may not be optimal due to an inadequate volume of blood received in culture bottles Performed at [facility] [address] [place], [place] [address]    Culture   Final    NO GROWTH 5 DAYS Performed at [facility], [place] [address], [place], [place] [address]    Report Status 03/25/2020 FINAL  Final  Urine culture     Status: Abnormal   Collection Time: 03/19/20 11:59 PM   Specimen: Urine, Random  Result Value Ref Range Status   Specimen Description   Final    URINE, RANDOM Performed at [facility] [address], [place], [place] [address]    Special Requests   Final    NONE Performed at [facility], [place] [address] [place], [place] [address]    Culture (A)  Final    <10,000 COLONIES/mL INSIGNIFICANT GROWTH Performed at [facility] [address] [place], [place] [address]    Report Status 03/21/2020 FINAL  Final  Respiratory Panel by RT PCR (Flu A&B, Covid) - Nasopharyngeal Swab     Status: None   Collection Time: 03/20/20  4:24 AM   Specimen: Nasopharyngeal Swab  Result Value Ref Range Status   SARS Coronavirus 2 by RT PCR NEGATIVE NEGATIVE Final    Comment: (NOTE) SARS-CoV-2 target nucleic acids are NOT DETECTED.  The SARS-CoV-2 RNA is generally detectable in upper respiratoy specimens during the acute phase of infection. The lowest concentration of SARS-CoV-2 viral copies this assay can detect is 131 copies/mL. A negative result does not preclude SARS-Cov-2 infection and should not be used as the sole basis for treatment or other patient management decisions. A negative result may occur with  improper specimen collection/handling, submission of specimen other than nasopharyngeal swab, presence of viral mutation(s) within the areas targeted by this assay, and inadequate number of viral copies (<131 copies/mL). A negative result must be combined  with clinical observations, patient history, and epidemiological information. The expected result is Negative.  Fact Sheet for Patients:  PinkCheek.be  Fact Sheet for Healthcare Providers:  GravelBags.it  This test is no t yet approved or cleared by the [place] FDA and  has been authorized for detection and/or diagnosis of SARS-CoV-2 by FDA under an Emergency Use Authorization (EUA). This EUA will remain  in effect (meaning this test can be used) for the duration of the COVID-19 declaration under Section 564(b)(1) of the Act, 21 U.S.C. section 360bbb-3(b)(1), unless the authorization is terminated or revoked sooner.     Influenza A by PCR NEGATIVE NEGATIVE Final   Influenza B by PCR NEGATIVE NEGATIVE Final    Comment: (NOTE) The Xpert Xpress SARS-CoV-2/FLU/RSV assay is intended as an aid in  the diagnosis of influenza from Nasopharyngeal swab specimens and  should not be used as a sole basis for treatment. Nasal washings and  aspirates are unacceptable for Xpert Xpress SARS-CoV-2/FLU/RSV  testing.  Fact Sheet for Patients: PinkCheek.be  Fact Sheet for Healthcare Providers: GravelBags.it  This test is not yet approved or cleared by the [place] FDA and  has been authorized for detection and/or diagnosis of SARS-CoV-2 by  FDA under an Emergency Use Authorization (EUA). This EUA will remain  in effect (meaning this test can be used) for the duration of the  Covid-19 declaration under Section 564(b)(1) of the Act, 21  U.S.C. section 360bbb-3(b)(1), unless the authorization is  terminated or revoked. Performed at [facility], [place] [address] [place], [place] [address]   Surgical pcr screen     Status: Abnormal   Collection Time: 03/23/20  8:09 PM   Specimen: Nasal Mucosa; Nasal Swab  Result Value Ref Range Status   MRSA, PCR  POSITIVE (A) NEGATIVE Final    Comment: RESULT CALLED TO, READ BACK BY AND VERIFIED WITH: V SNEIDERMAN RN 03/23/20 AT 2238 SK    Staphylococcus aureus POSITIVE (A) NEGATIVE Final    Comment: RESULT CALLED TO, READ BACK BY AND VERIFIED WITH: Andres Labrum RN 03/23/20 AT 2238 SK (NOTE) The Xpert SA Assay (FDA approved for NASAL specimens in patients 69 years of age and older), is one component of a comprehensive surveillance program. It is not intended to diagnose infection nor to guide or monitor treatment. Performed at [facility], [place] [address] [place], [place] [address]          Radiology Studies: No results found.      Scheduled Meds: . atorvastatin  40 mg Oral Daily  . clopidogrel  75 mg Oral Q breakfast  . cycloSPORINE  1 drop Both Eyes BID  . gabapentin  300 mg Oral BID  . heparin  5,000 Units Subcutaneous Q8H  . insulin aspart  0-9 Units Subcutaneous TID WC  . latanoprost  1 drop Both Eyes QHS  . mupirocin ointment  1 application Nasal BID  . pregabalin  50 mg Oral BID  . timolol  1 drop Both Eyes BID   Continuous Infusions: . sodium chloride 75 mL/hr at 03/24/20 1525  . ceFEPime (MAXIPIME) IV 2 g (03/25/20 0544)  . lactated ringers 10 mL/hr at 03/24/20 1102  . metronidazole 500 mg (03/25/20 0252)  . vancomycin 1,500 mg (03/24/20 2316)          Aline August, MD [facility] 03/25/2020, 10:16 AM

## 2020-03-26 DIAGNOSIS — E1151 Type 2 diabetes mellitus with diabetic peripheral angiopathy without gangrene: Secondary | ICD-10-CM

## 2020-03-26 DIAGNOSIS — I70209 Unspecified atherosclerosis of native arteries of extremities, unspecified extremity: Secondary | ICD-10-CM

## 2020-03-26 LAB — CBC WITH DIFFERENTIAL/PLATELET
Abs Immature Granulocytes: 0.14 10*3/uL — ABNORMAL HIGH (ref 0.00–0.07)
Basophils Absolute: 0.1 10*3/uL (ref 0.0–0.1)
Basophils Relative: 1 %
Eosinophils Absolute: 1.1 10*3/uL — ABNORMAL HIGH (ref 0.0–0.5)
Eosinophils Relative: 13 %
HCT: 31.2 % — ABNORMAL LOW (ref 39.0–52.0)
Hemoglobin: 10.1 g/dL — ABNORMAL LOW (ref 13.0–17.0)
Immature Granulocytes: 2 %
Lymphocytes Relative: 14 %
Lymphs Abs: 1.2 10*3/uL (ref 0.7–4.0)
MCH: 28.3 pg (ref 26.0–34.0)
MCHC: 32.4 g/dL (ref 30.0–36.0)
MCV: 87.4 fL (ref 80.0–100.0)
Monocytes Absolute: 0.7 10*3/uL (ref 0.1–1.0)
Monocytes Relative: 9 %
Neutro Abs: 5.3 10*3/uL (ref 1.7–7.7)
Neutrophils Relative %: 61 %
Platelets: 218 10*3/uL (ref 150–400)
RBC: 3.57 MIL/uL — ABNORMAL LOW (ref 4.22–5.81)
RDW: 16.3 % — ABNORMAL HIGH (ref 11.5–15.5)
WBC: 8.5 10*3/uL (ref 4.0–10.5)
nRBC: 0 % (ref 0.0–0.2)

## 2020-03-26 LAB — BASIC METABOLIC PANEL
Anion gap: 9 (ref 5–15)
BUN: 12 mg/dL (ref 8–23)
CO2: 18 mmol/L — ABNORMAL LOW (ref 22–32)
Calcium: 7.8 mg/dL — ABNORMAL LOW (ref 8.9–10.3)
Chloride: 111 mmol/L (ref 98–111)
Creatinine, Ser: 1.06 mg/dL (ref 0.61–1.24)
GFR, Estimated: >60 mL/min (ref >60)
Glucose, Bld: 100 mg/dL — ABNORMAL HIGH (ref 70–99)
Potassium: 3.4 mmol/L — ABNORMAL LOW (ref 3.5–5.1)
Sodium: 138 mmol/L (ref 135–145)

## 2020-03-26 LAB — MAGNESIUM: Magnesium: 1.6 mg/dL — ABNORMAL LOW (ref 1.7–2.4)

## 2020-03-26 LAB — GLUCOSE, CAPILLARY
Glucose-Capillary: 80 mg/dL (ref 70–99)
Glucose-Capillary: 88 mg/dL (ref 70–99)
Glucose-Capillary: 123 mg/dL — ABNORMAL HIGH (ref 70–99)
Glucose-Capillary: 118 mg/dL — ABNORMAL HIGH (ref 70–99)

## 2020-03-26 MED ORDER — POTASSIUM CHLORIDE CRYS ER 20 MEQ PO TBCR
40.0000 meq | EXTENDED_RELEASE_TABLET | Freq: Once | ORAL | Status: AC
  Administered 2020-03-26: 40 meq via ORAL
  Filled 2020-03-26: qty 2

## 2020-03-26 MED ORDER — MAGNESIUM SULFATE 2 GM/50ML IV SOLN
2.0000 g | Freq: Once | INTRAVENOUS | Status: AC
  Administered 2020-03-26: 2 g via INTRAVENOUS
  Filled 2020-03-26: qty 50

## 2020-03-26 NOTE — Progress Notes (Signed)
Patient ID: [name], male   DOB: Feb 11, 1941, 79 y.o.   MRN: [id number]  PROGRESS NOTE    Ivan Lacher  CWC:[id number] DOB: September 27, 1940 DOA: 03/19/2020 PCP: [name], MD   Brief Narrative:  79 y.o.malewithhistory of previous unspecified stroke, hypertension, diabetes mellitus type II, bilateral transmetatarsal amputation of both feet presented with increasing weakness.  On presentation, WBCs was 21.3.  Urinalysis and chest x-ray were unremarkable.  COVID-19 PCR was negative.  He was found to have open wounds on the sole of both partially amputated feet.  X-ray of the right foot showed findings concerning for osteomyelitis of the medial cuneiform bone.   MRI right foot showed acute osteomyelitis of the medial cuneiform bone.  He was started on broad-spectrum antibiotics.  Orthopedics was consulted.  He was transferred to [facility] on 03/23/2020 for surgical intervention today.  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 -MRI of right foot showed acute osteomyelitis. -Currently on IV vancomycin, cefepime and Flagyl. -Status post Chopart amputation of right foot, with excision of the medial cuneiform on 03/24/2020 by Dr. Sharol Given.  DC'd antibiotics 24 hours after procedure.  Sepsis: Present on admission -Probably from above.  Cultures have been negative so far.  Currently hemodynamically stable. -Sepsis has resolved. Antibiotics have been discontinued.  Generalized weakness -Probably from above.  MRI brain unremarkable. -PT/OT recommend SNF placement. Patient is adamantly refusing SNF placement at this time.  Acute renal failure -Creatinine improved with IV hydration.  Lisinopril and Metformin on hold.  Diabetes mellitus type 2 -Continue CBGs with SSI.  Metformin on hold  History of unspecified stroke -Continue Plavix and statin.   DVT prophylaxis: Heparin Code Status:  DNR Family Communication: None at bedside Disposition Plan: Status is: Inpatient  Remains inpatient appropriate because:Inpatient level of care appropriate due to severity of illness. Home probably tomorrow if remains stable. Adamantly refusing SNF placement.   Dispo: The patient is from: Home              Anticipated d/c is to: Home with home health PT/OT              Anticipated d/c date is: 1 day              Patient currently is not medically stable to d/c.   Consultants: Orthopedic/Dr. Sharol Given  Procedures: Chopart amputation of right foot, with excision of the medial cuneiform on 03/24/2020 by Dr. Sharol Given Antimicrobials:  Anti-infectives (From admission, onward)   Start     Dose/Rate Route Frequency Ordered Stop   03/24/20 1400  ceFEPIme (MAXIPIME) 2 g in sodium chloride 0.9 % 100 mL IVPB        2 g 200 mL/hr over 30 Minutes Intravenous Every 8 hours 03/24/20 1241 03/25/20 2245   03/24/20 1215  ceFAZolin (ANCEF) IVPB 2g/100 mL premix  Status:  Discontinued        2 g 200 mL/hr over 30 Minutes Intravenous Every 6 hours 03/24/20 1207 03/24/20 1241   03/24/20 1100  metroNIDAZOLE (FLAGYL) IVPB 500 mg        500 mg 100 mL/hr over 60 Minutes Intravenous Every 8 hours 03/24/20 1007 03/25/20 2359   03/24/20 1030  ceFAZolin (ANCEF) IVPB 2g/100 mL premix  Status:  Discontinued        2 g 200 mL/hr over 30 Minutes Intravenous On call to O.R. 03/23/20 1452 03/24/20 1206   03/22/20 0600  ceFAZolin (ANCEF) 3 g in  dextrose 5 % 50 mL IVPB        3 g 100 mL/hr over 30 Minutes Intravenous On call to O.R. 03/21/20 1214 03/23/20 0559   03/20/20 2200  vancomycin (VANCOREADY) IVPB 1500 mg/300 mL        1,500 mg 150 mL/hr over 120 Minutes Intravenous Every 24 hours 03/20/20 1510 03/25/20 2359   03/20/20 1600  vancomycin (VANCOREADY) IVPB 1250 mg/250 mL        1,250 mg 166.7 mL/hr over 90 Minutes Intravenous  Once 03/20/20 1510 03/20/20 1711   03/19/20 2315  cefTRIAXone (ROCEPHIN) 1 g in sodium  chloride 0.9 % 100 mL IVPB        1 g 200 mL/hr over 30 Minutes Intravenous  Once 03/19/20 2308 03/20/20 0049       Subjective: Patient seen and examined at bedside. Adamantly refuses to go to SNF. States that he will go home, does not feel ready today. Has some lower extremity pain. No overnight fever or vomiting reported.  Objective: Vitals:   03/25/20 1951 03/26/20 0300 03/26/20 0500 03/26/20 0808  BP: (!) 104/58 114/60  122/68  Pulse: 60 72  70  Resp: 18 17  16   Temp: 98.3 F (36.8 C) 98.5 F (36.9 C)  98.3 F (36.8 C)  TempSrc: Oral Oral  Oral  SpO2: 100% 98%  100%  Weight:   85 kg   Height:        Intake/Output Summary (Last 24 hours) at 03/26/2020 0922 Last data filed at 03/26/2020 0700 Gross per 24 hour  Intake 1202.32 ml  Output 2600 ml  Net -1397.68 ml   Filed Weights   03/24/20 0500 03/24/20 1014 03/26/20 0500  Weight: 86.3 kg 86.3 kg 85 kg    Examination:  General exam: No acute distress. Poor historian.  Elderly male lying on bed. Respiratory system: Bilateral decreased breath sounds at bases with no wheezing cardiovascular system: S1-S2 heard, rate controlled Gastrointestinal system: Abdomen is nondistended, soft and nontender. Normal bowel sounds are heard  extremities: Right foot dressing present. No clubbing  Data Reviewed: I have personally reviewed following labs and imaging studies  CBC: Recent Labs  Lab 03/22/20 0029 03/23/20 0440 03/24/20 0609 03/25/20 0248 03/26/20 0045  WBC 7.1 6.9 7.6 8.2 8.5  NEUTROABS 4.3 3.6 4.1 5.3 5.3  HGB 9.9* 9.9* 9.8* 8.3* 10.1*  HCT 30.6* 30.8* 30.4* 26.0* 31.2*  MCV 89.7 89.8 86.6 87.0 87.4  PLT 196 192 200 203 759   Basic Metabolic Panel: Recent Labs  Lab 03/22/20 0029 03/23/20 0440 03/24/20 0609 03/25/20 0248 03/26/20 0045  NA 134* 140 138 138 138  K 3.6 3.4* 3.3* 3.1* 3.4*  CL 106 110 108 110 111  CO2 22 20* 21* 20* 18*  GLUCOSE 110* 89 95 102* 100*  BUN 19 12 10 10 12   CREATININE 1.37*    1.37* 1.04 1.03 1.05 1.06  CALCIUM 8.0* 8.2* 8.0* 7.2* 7.8*  MG  --   --   --  1.4* 1.6*   GFR: Estimated Creatinine Clearance: 66 mL/min (by C-G formula based on SCr of 1.06 mg/dL). Liver Function Tests: Recent Labs  Lab 03/19/20 2157  AST 18  ALT 12  ALKPHOS 40  BILITOT 1.0  PROT 7.6  ALBUMIN 3.5   No results for input(s): LIPASE, AMYLASE in the last 168 hours. No results for input(s): AMMONIA in the last 168 hours. Coagulation Profile: No results for input(s): INR, PROTIME in the last 168 hours. Cardiac Enzymes: Recent  Labs  Lab 03/20/20 1002  CKTOTAL 288   BNP (last 3 results) No results for input(s): PROBNP in the last 8760 hours. HbA1C: No results for input(s): HGBA1C in the last 72 hours. CBG: Recent Labs  Lab 03/25/20 0633 03/25/20 1149 03/25/20 1554 03/25/20 2324 03/26/20 0657  GLUCAP 92 87 84 99 80   Lipid Profile: No results for input(s): CHOL, HDL, LDLCALC, TRIG, CHOLHDL, LDLDIRECT in the last 72 hours. Thyroid Function Tests: No results for input(s): TSH, T4TOTAL, FREET4, T3FREE, THYROIDAB in the last 72 hours. Anemia Panel: No results for input(s): VITAMINB12, FOLATE, FERRITIN, TIBC, IRON, RETICCTPCT in the last 72 hours. Sepsis Labs: Recent Labs  Lab 03/19/20 2359 03/22/20 0029  LATICACIDVEN 1.5 0.9    Recent Results (from the past 240 hour(s))  Aerobic Culture (superficial specimen)     Status: None   Collection Time: 03/17/20  8:00 AM   Specimen: Heel  Result Value Ref Range Status   Specimen Description   Final    HEEL LEFT Performed at [facility] [address], [place], [place] [address]    Special Requests   Final    NONE Performed at [facility], [place] [address] [place], [place] [address]    Gram Stain   Final    FEW WBC PRESENT, PREDOMINANTLY PMN FEW GRAM POSITIVE COCCI    Culture   Final    MODERATE METHICILLIN RESISTANT STAPHYLOCOCCUS AUREUS FEW GROUP B  STREP(S.AGALACTIAE)ISOLATED TESTING AGAINST S. AGALACTIAE NOT ROUTINELY PERFORMED DUE TO PREDICTABILITY OF AMP/PEN/VAN SUSCEPTIBILITY. Performed at [facility], [place] [address] [place], [place] [address]    Report Status 03/20/2020 FINAL  Final   Organism ID, Bacteria METHICILLIN RESISTANT STAPHYLOCOCCUS AUREUS  Final      Susceptibility   Methicillin resistant staphylococcus aureus - MIC*    CIPROFLOXACIN >=8 RESISTANT Resistant     ERYTHROMYCIN >=8 RESISTANT Resistant     GENTAMICIN <=0.5 SENSITIVE Sensitive     OXACILLIN >=4 RESISTANT Resistant     TETRACYCLINE <=1 SENSITIVE Sensitive     VANCOMYCIN 1 SENSITIVE Sensitive     TRIMETH/SULFA >=320 RESISTANT Resistant     CLINDAMYCIN <=0.25 SENSITIVE Sensitive     RIFAMPIN <=0.5 SENSITIVE Sensitive     Inducible Clindamycin NEGATIVE Sensitive     * MODERATE METHICILLIN RESISTANT STAPHYLOCOCCUS AUREUS  Blood culture (routine x 2)     Status: None   Collection Time: 03/19/20 11:59 PM   Specimen: BLOOD  Result Value Ref Range Status   Specimen Description   Final    BLOOD LEFT ANTECUBITAL Performed at [facility] [address] [place], [place] [address]    Special Requests   Final    BOTTLES DRAWN AEROBIC AND ANAEROBIC Blood Culture adequate volume Performed at [facility] [address], [place], [place] [address]    Culture   Final    NO GROWTH 5 DAYS Performed at [facility], [place] [address] [place], [place] [address]    Report Status 03/25/2020 FINAL  Final  Blood culture (routine x 2)     Status: None   Collection Time: 03/19/20 11:59 PM   Specimen: BLOOD  Result Value Ref Range Status   Specimen Description   Final    BLOOD RIGHT ANTECUBITAL Performed at [facility] [address] [place], [place] [address]    Special Requests   Final    BOTTLES DRAWN AEROBIC ONLY Blood Culture results may  not be optimal due to an inadequate volume of blood  received in culture bottles Performed at [facility] [address] [place], [place] [address]    Culture   Final    NO GROWTH 5 DAYS Performed at [facility], [place] [address] [place], [place] [address]    Report Status 03/25/2020 FINAL  Final  Urine culture     Status: Abnormal   Collection Time: 03/19/20 11:59 PM   Specimen: Urine, Random  Result Value Ref Range Status   Specimen Description   Final    URINE, RANDOM Performed at [facility] [address] [place], [place] [address]    Special Requests   Final    NONE Performed at [facility], [place] [address] [place], [place] [address]    Culture (A)  Final    <10,000 COLONIES/mL INSIGNIFICANT GROWTH Performed at [facility] [address] [place], [place] [address]    Report Status 03/21/2020 FINAL  Final  Respiratory Panel by RT PCR (Flu A&B, Covid) - Nasopharyngeal Swab     Status: None   Collection Time: 03/20/20  4:24 AM   Specimen: Nasopharyngeal Swab  Result Value Ref Range Status   SARS Coronavirus 2 by RT PCR NEGATIVE NEGATIVE Final    Comment: (NOTE) SARS-CoV-2 target nucleic acids are NOT DETECTED.  The SARS-CoV-2 RNA is generally detectable in upper respiratoy specimens during the acute phase of infection. The lowest concentration of SARS-CoV-2 viral copies this assay can detect is 131 copies/mL. A negative result does not preclude SARS-Cov-2 infection and should not be used as the sole basis for treatment or other patient management decisions. A negative result may occur with  improper specimen collection/handling, submission of specimen other than nasopharyngeal swab, presence of viral mutation(s) within the areas targeted by this assay, and inadequate number of viral copies (<131 copies/mL). A negative result must be combined with clinical observations, patient history, and epidemiological information. The expected result is  Negative.  Fact Sheet for Patients:  PinkCheek.be  Fact Sheet for Healthcare Providers:  GravelBags.it  This test is no t yet approved or cleared by the [place] FDA and  has been authorized for detection and/or diagnosis of SARS-CoV-2 by FDA under an Emergency Use Authorization (EUA). This EUA will remain  in effect (meaning this test can be used) for the duration of the COVID-19 declaration under Section 564(b)(1) of the Act, 21 U.S.C. section 360bbb-3(b)(1), unless the authorization is terminated or revoked sooner.     Influenza A by PCR NEGATIVE NEGATIVE Final   Influenza B by PCR NEGATIVE NEGATIVE Final    Comment: (NOTE) The Xpert Xpress SARS-CoV-2/FLU/RSV assay is intended as an aid in  the diagnosis of influenza from Nasopharyngeal swab specimens and  should not be used as a sole basis for treatment. Nasal washings and  aspirates are unacceptable for Xpert Xpress SARS-CoV-2/FLU/RSV  testing.  Fact Sheet for Patients: PinkCheek.be  Fact Sheet for Healthcare Providers: GravelBags.it  This test is not yet approved or cleared by the [place] FDA and  has been authorized for detection and/or diagnosis of SARS-CoV-2 by  FDA under an Emergency Use Authorization (EUA). This EUA will remain  in effect (meaning this test can be used) for the duration of the  Covid-19 declaration under Section 564(b)(1) of the Act, 21  U.S.C. section 360bbb-3(b)(1), unless the authorization is  terminated or revoked. Performed at [facility], [place]  [address] [place], [place] [address]   Surgical pcr screen     Status: Abnormal   Collection Time: 03/23/20  8:09 PM   Specimen: Nasal Mucosa; Nasal Swab  Result Value Ref Range Status   MRSA, PCR POSITIVE (A) NEGATIVE Final    Comment: RESULT CALLED TO, READ BACK BY AND VERIFIED WITH: V SNEIDERMAN RN  03/23/20 AT 2238 SK    Staphylococcus aureus POSITIVE (A) NEGATIVE Final    Comment: RESULT CALLED TO, READ BACK BY AND VERIFIED WITH: Andres Labrum RN 03/23/20 AT 2238 SK (NOTE) The Xpert SA Assay (FDA approved for NASAL specimens in patients 18 years of age and older), is one component of a comprehensive surveillance program. It is not intended to diagnose infection nor to guide or monitor treatment. Performed at [facility], [place] [address], [place], [place] [address]          Radiology Studies: No results found.      Scheduled Meds:  atorvastatin  40 mg Oral Daily   clopidogrel  75 mg Oral Q breakfast   cycloSPORINE  1 drop Both Eyes BID   gabapentin  300 mg Oral BID   heparin  5,000 Units Subcutaneous Q8H   insulin aspart  0-9 Units Subcutaneous TID WC   latanoprost  1 drop Both Eyes QHS   mupirocin ointment  1 application Nasal BID   pregabalin  50 mg Oral BID   timolol  1 drop Both Eyes BID   Continuous Infusions:  lactated ringers 10 mL/hr at 03/24/20 1102   magnesium sulfate bolus IVPB            Aline August, MD [facility] 03/26/2020, 9:22 AM

## 2020-03-26 NOTE — TOC Initial Note (Addendum)
Transition of Care [facility]) - Initial/Assessment Note    Patient Details  Name: [name] MRN: [id number] Date of Birth: [birth year]  Transition of Care [facility]) CM/SW Contact:    Joanne Chars, LCSW Phone Number: 03/26/2020, 11:04 AM  Clinical Narrative: CSW met with pt to discuss DC recommendation for SNF.  Pt states he does not want SNF and can recover at home.  Pt has [facility] services currently active.  Pt reports he lives alone but has two nieces, [name] and Butch Penny, who come by the home daily.  CSW discussed PT/OT recommendation for 24 hours supervision but pt still declining SNF.  Permission given to speak to both nieces and to [facility].  Pt is vaccinated for covid.  Pt currently has walker, 2 canes, shower bench in the home.    1100: CSW LM with niece  [name] to discuss support in the home.    1215: CSW spoke both with [name] and with Butch Penny (another niece) and both are in agreement that pt needs SNF placement.  They will both be coming to the hospital to discuss with pt.  1415: CSW met with pt and [name].  Pt now agreeable to plan for SNF.  Choice document provided and pt sent out in hub.              Expected Discharge Plan: [place] Barriers to Discharge: Continued Medical Work up   Patient Goals and CMS Choice Patient states their goals for this hospitalization and ongoing recovery are:: "get back to 100%" CMS Medicare.gov Compare Post Acute Care list provided to:: Patient Choice offered to / list presented to : Patient  Expected Discharge Plan and Services Expected Discharge Plan: [place]   Discharge Planning Services: CM Consult Post Acute Care Choice: [place] arrangements for the past 2 months: Apartment                                      Prior Living Arrangements/Services Living arrangements for the past 2 months: Apartment Lives with:: Self Patient language and need for interpreter  reviewed:: Yes Do you feel safe going back to the place where you live?: Yes      Need for Family Participation in Patient Care: Yes (Comment) Care giver support system in place?: Yes (comment) Current home services: Home RN Criminal Activity/Legal Involvement Pertinent to Current Situation/Hospitalization: No - Comment as needed  Activities of Daily Living Home Assistive Devices/Equipment: Walker (specify type), Raised toilet seat with rails, Cane (specify quad or straight), Eyeglasses ADL Screening (condition at time of admission) Patient's cognitive ability adequate to safely complete daily activities?: Yes Is the patient deaf or have difficulty hearing?: No Does the patient have difficulty seeing, even when wearing glasses/contacts?: Yes Does the patient have difficulty concentrating, remembering, or making decisions?: No Patient able to express need for assistance with ADLs?: Yes Does the patient have difficulty dressing or bathing?: No Independently performs ADLs?: No Communication: Independent Dressing (OT): Needs assistance Is this a change from baseline?: Change from baseline, expected to last >3 days Grooming: Needs assistance Is this a change from baseline?: Change from baseline, expected to last >3 days Feeding: Needs assistance Is this a change from baseline?: Change from baseline, expected to last >3 days Bathing: Needs assistance Is this a change from baseline?: Change from baseline, expected to last >3 days  Toileting: Needs assistance Is this a change from baseline?: Change from baseline, expected to last >3days In/Out Bed: Needs assistance Is this a change from baseline?: Change from baseline, expected to last >3 days Walks in Home: Needs assistance Is this a change from baseline?: Change from baseline, expected to last >3 days Does the patient have difficulty walking or climbing stairs?: No (does not do stairs) Weakness of Legs: Both Weakness of Arms/Hands:  Both  Permission Sought/Granted Permission sought to share information with : Facility Sport and exercise psychologist, Family Supports Permission granted to share information with : Yes, Verbal Permission Granted  Share Information with NAME: [name] and Butch Penny, both neices  Permission granted to share info w AGENCY: HH        Emotional Assessment Appearance:: Appears stated age Attitude/Demeanor/Rapport: Engaged Affect (typically observed): Appropriate, Pleasant Orientation: : Oriented to Self, Oriented to Place, Oriented to  Time, Oriented to Situation Alcohol / Substance Use: Not Applicable Psych Involvement: No (comment)  Admission diagnosis:  Hypokalemia [E87.6] Weakness [R53.1] SIRS (systemic inflammatory response syndrome) (HCC) [R65.10] Acute encephalopathy [G93.40] Leukocytosis, unspecified type [D72.829] Patient Active Problem List   Diagnosis Date Noted  . Acute encephalopathy 03/20/2020  . SIRS (systemic inflammatory response syndrome) ([place]) 03/20/2020  . 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[place]) 03/20/2020  . ARF (acute renal failure) ([place]) 03/20/2020  . Midfoot ulcer, left, limited to breakdown of skin ([place]) 12/29/2018  . Occipital stroke ([place]) 10/31/2018  . DM2 (diabetes mellitus, type 2) ([place]) 10/31/2018  . NSTEMI (non-ST elevated myocardial infarction) ([place]) 05/28/2018  . Syncope 05/28/2018  . HTN (hypertension) 05/28/2018  . Hyperlipemia, mixed 05/28/2018  . Rash and nonspecific skin eruption 12/16/2017  . Pain in right hand 09/11/2017  . Carpal tunnel syndrome, right upper limb 07/01/2017  . Arthritis of carpometacarpal [facility]) joint of left thumb 12/30/2016  . Midfoot skin ulcer, right, limited to breakdown of skin ([place]) 07/22/2016  . 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[place]) 09/23/2014  . Osteomyelitis of ankle or foot, left, acute ([place]) 08/23/2014  . Cellulitis 10/12/2012  . Chest pain 10/12/2012  . Tobacco abuse 10/12/2012  . ABSCESS, AXILLA, LEFT 02/23/2008  .  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 CHEST WALL PAIN, ACUTE 12/03/2007  . CANDIDIASIS, GLANS PENIS 09/16/2007  . HEADACHE 08/04/2007  . 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 Other and unspecified hyperlipidemia 03/05/2007  . GOUT 03/05/2007  . ERECTILE DYSFUNCTION 03/05/2007  . EXTERNAL HEMORRHOIDS 03/05/2007  . VENTRAL HERNIA 03/05/2007  . FATTY LIVER DISEASE 03/05/2007  . Osteoarthritis 03/05/2007  . HEMORRHOIDS, INTERNAL 03/17/2005  . COLONIC POLYPS, HX OF 03/17/2005   PCP:  [name], MD Pharmacy:   [place], [place] - [address] [place] [address] [place] [address] Phone: [phone] Fax: [place], [place] [address] Phone: [phone] Fax: [phone]  [facility] - [place], [place] - [address] [place] [address] Phone: [phone] Fax: [phone]     Social Determinants of Health ([place]) Interventions    Readmission Risk Interventions Readmission Risk Prevention Plan 11/01/2018  Transportation Screening Complete  PCP or Specialist Appt within 5-7 Days Complete  Home Care Screening Complete  Medication Review (RN CM) Complete  Some recent data might be hidden

## 2020-03-26 NOTE — NC FL2 (Signed)
[place] MEDICAID FL2 LEVEL OF CARE SCREENING TOOL     IDENTIFICATION  Patient Name: [name] Birthdate: [birth year] Sex: male Admission Date (Current Location): 03/19/2020  [facility] and [place] Number:  Herbalist and Address:  [facility] [place], [place] [address] [place], [place] [address]      Provider Number: [id number]  Attending Physician Name and Address:  Aline August, MD  Relative Name and Phone Number:  [name], Niece   [phone]    Current Level of Care: Hospital Recommended Level of Care: [place] Prior Approval Number:    Date Approved/Denied:   PASRR Number: [id number]  Discharge Plan: SNF    Current Diagnoses: Patient Active Problem List   Diagnosis Date Noted  . Acute encephalopathy 03/20/2020  . SIRS (systemic inflammatory response syndrome) ([place]) 03/20/2020  . 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[place]) 03/20/2020  . ARF (acute renal failure) ([place]) 03/20/2020  . Midfoot ulcer, left, limited to breakdown of skin ([place]) 12/29/2018  . Occipital stroke ([place]) 10/31/2018  . DM2 (diabetes mellitus, type 2) ([place]) 10/31/2018  . NSTEMI (non-ST elevated myocardial infarction) ([place]) 05/28/2018  . Syncope 05/28/2018  . HTN (hypertension) 05/28/2018  . Hyperlipemia, mixed 05/28/2018  . Rash and nonspecific skin eruption 12/16/2017  . Pain in right hand 09/11/2017  . Carpal tunnel syndrome, right upper limb 07/01/2017  . 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 Midfoot skin ulcer, right, limited to breakdown of skin ([place]) 07/22/2016  . 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[place]) 09/23/2014  . Osteomyelitis of ankle or foot, left, acute ([place]) 08/23/2014  . Cellulitis 10/12/2012  . Chest pain 10/12/2012  . Tobacco abuse 10/12/2012  . ABSCESS, AXILLA, LEFT 02/23/2008  . 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 CHEST WALL PAIN, ACUTE 12/03/2007  . CANDIDIASIS, GLANS PENIS 09/16/2007  .  HEADACHE 08/04/2007  . 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 Other and unspecified hyperlipidemia 03/05/2007  . GOUT 03/05/2007  . ERECTILE DYSFUNCTION 03/05/2007  . EXTERNAL HEMORRHOIDS 03/05/2007  . VENTRAL HERNIA 03/05/2007  . FATTY LIVER DISEASE 03/05/2007  . Osteoarthritis 03/05/2007  . HEMORRHOIDS, INTERNAL 03/17/2005  . COLONIC POLYPS, HX OF 03/17/2005    Orientation RESPIRATION BLADDER Height & Weight     Self, Time, Situation, Place  Normal Incontinent Weight: 187 lb 6.3 oz (85 kg) Height:  6\' 1"  (185.4 cm)  BEHAVIORAL SYMPTOMS/MOOD NEUROLOGICAL BOWEL NUTRITION STATUS      Continent Diet (Carb modified.  See discharge summary)  AMBULATORY STATUS COMMUNICATION OF NEEDS Skin   Limited Assist Verbally Surgical wounds                       Personal Care Assistance Level of Assistance  Bathing, Feeding, Dressing Bathing Assistance: Limited assistance Feeding assistance: Limited assistance Dressing Assistance: Maximum assistance     Functional Limitations Info  Sight, Hearing, Speech Sight Info: Adequate Hearing Info: Adequate Speech Info: Adequate    SPECIAL CARE FACTORS FREQUENCY  PT (By licensed PT), OT (By licensed OT)     PT Frequency: 5x week OT Frequency: 5x week            Contractures Contractures Info: Not present    Additional Factors Info  Code Status, Allergies, Insulin Sliding Scale Code Status Info: DNR Allergies Info: NKA   Insulin Sliding Scale Info: Novolog 0-9 units, 3x day with meals  Current Medications (03/26/2020):  This is the current hospital active medication list Current Facility-Administered Medications  Medication Dose Route Frequency Provider Last Rate Last Admin  . acetaminophen (TYLENOL) tablet 650 mg  650 mg Oral Q6H PRN Persons, Bevely Palmer, [place]   650 mg at 03/26/20 9741   Or  . acetaminophen (TYLENOL) suppository 650 mg  650  mg Rectal Q6H PRN Persons, Bevely Palmer, PA      . atorvastatin (LIPITOR) tablet 40 mg  40 mg Oral Daily Persons, Bevely Palmer, [place]   40 mg at 03/26/20 6384  . clopidogrel (PLAVIX) tablet 75 mg  75 mg Oral Q breakfast Persons, Bevely Palmer, PA   75 mg at 03/26/20 5364  . cycloSPORINE (RESTASIS) 0.05 % ophthalmic emulsion 1 drop  1 drop Both Eyes BID Persons, Bevely Palmer, PA   1 drop at 03/26/20 1019  . dextrose 50 % solution 25 mL  25 mL Intravenous PRN Alekh, Kshitiz, MD      . gabapentin (NEURONTIN) capsule 300 mg  300 mg Oral BID Persons, Bevely Palmer, PA   300 mg at 03/26/20 6803  . heparin injection 5,000 Units  5,000 Units Subcutaneous Q8H Persons, Bevely Palmer, PA   5,000 Units at 03/26/20 2122  . insulin aspart (novoLOG) injection 0-9 Units  0-9 Units Subcutaneous TID WC Persons, Bevely Palmer, PA      . lactated ringers infusion   Intravenous Continuous Persons, Bevely Palmer, [place] 10 mL/hr at 03/24/20 1102 Restarted at 03/24/20 1103  . latanoprost (XALATAN) 0.005 % ophthalmic solution 1 drop  1 drop Both Eyes QHS Persons, Bevely Palmer, [place]   1 drop at 03/25/20 2227  . mupirocin ointment (BACTROBAN) 2 % 1 application  1 application Nasal BID Persons, Bevely Palmer, [place]   1 application at 48/25/00 [phone]  . ondansetron (ZOFRAN) tablet 4 mg  4 mg Oral Q6H PRN Persons, Bevely Palmer, PA       Or  . ondansetron [facility]) injection 4 mg  4 mg Intravenous Q6H PRN Persons, Bevely Palmer, PA      . pregabalin (LYRICA) capsule 50 mg  50 mg Oral BID Persons, Bevely Palmer, PA   50 mg at 03/26/20 8889  . timolol (TIMOPTIC) 0.5 % ophthalmic solution 1 drop  1 drop Both Eyes BID Persons, Bevely Palmer, [place]   1 drop at 03/26/20 1694  . traMADol (ULTRAM) tablet 50 mg  50 mg Oral Q6H PRN Persons, Bevely Palmer, [place]   50 mg at 03/23/20 2037     Discharge Medications: Please see discharge summary for a list of discharge medications.  Relevant Imaging Results:  Relevant Lab Results:   Additional Information SSN [id number]  Joanne Chars,  LCSW

## 2020-03-26 NOTE — Plan of Care (Signed)

## 2020-03-27 LAB — BASIC METABOLIC PANEL
Anion gap: 10 (ref 5–15)
BUN: 11 mg/dL (ref 8–23)
CO2: 20 mmol/L — ABNORMAL LOW (ref 22–32)
Calcium: 8 mg/dL — ABNORMAL LOW (ref 8.9–10.3)
Chloride: 108 mmol/L (ref 98–111)
Creatinine, Ser: 1.01 mg/dL (ref 0.61–1.24)
GFR, Estimated: >60 mL/min (ref >60)
Glucose, Bld: 97 mg/dL (ref 70–99)
Potassium: 3.7 mmol/L (ref 3.5–5.1)
Sodium: 138 mmol/L (ref 135–145)

## 2020-03-27 LAB — CBC WITH DIFFERENTIAL/PLATELET
Abs Immature Granulocytes: 0.1 10*3/uL — ABNORMAL HIGH (ref 0.00–0.07)
Basophils Absolute: 0 10*3/uL (ref 0.0–0.1)
Basophils Relative: 0 %
Eosinophils Absolute: 1.1 10*3/uL — ABNORMAL HIGH (ref 0.0–0.5)
Eosinophils Relative: 13 %
HCT: 29.5 % — ABNORMAL LOW (ref 39.0–52.0)
Hemoglobin: 9.6 g/dL — ABNORMAL LOW (ref 13.0–17.0)
Immature Granulocytes: 1 %
Lymphocytes Relative: 16 %
Lymphs Abs: 1.4 10*3/uL (ref 0.7–4.0)
MCH: 28.6 pg (ref 26.0–34.0)
MCHC: 32.5 g/dL (ref 30.0–36.0)
MCV: 87.8 fL (ref 80.0–100.0)
Monocytes Absolute: 0.8 10*3/uL (ref 0.1–1.0)
Monocytes Relative: 9 %
Neutro Abs: 5.4 10*3/uL (ref 1.7–7.7)
Neutrophils Relative %: 61 %
Platelets: 280 10*3/uL (ref 150–400)
RBC: 3.36 MIL/uL — ABNORMAL LOW (ref 4.22–5.81)
RDW: 16.4 % — ABNORMAL HIGH (ref 11.5–15.5)
WBC: 8.9 10*3/uL (ref 4.0–10.5)
nRBC: 0 % (ref 0.0–0.2)

## 2020-03-27 LAB — GLUCOSE, CAPILLARY
Glucose-Capillary: 97 mg/dL (ref 70–99)
Glucose-Capillary: 78 mg/dL (ref 70–99)
Glucose-Capillary: 86 mg/dL (ref 70–99)
Glucose-Capillary: 121 mg/dL — ABNORMAL HIGH (ref 70–99)

## 2020-03-27 LAB — SARS CORONAVIRUS 2 BY RT PCR (HOSPITAL ORDER, PERFORMED IN ~~LOC~~ HOSPITAL LAB): SARS Coronavirus 2: NEGATIVE

## 2020-03-27 LAB — MAGNESIUM: Magnesium: 1.9 mg/dL (ref 1.7–2.4)

## 2020-03-27 NOTE — Progress Notes (Signed)
Patient ID: [name], male   DOB: [birth year], 79 y.o.   MRN: [id number] Patient is status post stroke "amputation with excision of the medial cuneiform.  Anticipate discharge to skilled nursing patient has no complaints at this time.

## 2020-03-27 NOTE — Progress Notes (Signed)
Patient ID: [name], male   DOB: August 16, 1940, 79 y.o.   MRN: [id number]  PROGRESS NOTE    [name]  NUU:[id number] DOB: September 25, 1940 DOA: 03/19/2020 PCP: [name], MD   Brief Narrative:  79 y.o.malewithhistory of previous unspecified stroke, hypertension, diabetes mellitus type II, bilateral transmetatarsal amputation of both feet presented with increasing weakness.  On presentation, WBCs was 21.3.  Urinalysis and chest x-ray were unremarkable.  COVID-19 PCR was negative.  He was found to have open wounds on the sole of both partially amputated feet.  X-ray of the right foot showed findings concerning for osteomyelitis of the medial cuneiform bone.   MRI right foot showed acute osteomyelitis of the medial cuneiform bone.  He was started on broad-spectrum antibiotics.  Orthopedics was consulted.  He was transferred to [facility] on 03/23/2020 for surgical intervention today.  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 -MRI of right foot showed acute osteomyelitis. -Currently on IV vancomycin, cefepime and Flagyl. -Status post Chopart amputation of right foot, with excision of the medial cuneiform on 03/24/2020 by Dr. Sharol Given.  DC'd antibiotics 24 hours after procedure.  Sepsis: Present on admission -Probably from above.  Cultures have been negative so far.  Currently hemodynamically stable. -Sepsis has resolved. Antibiotics have been discontinued.  Generalized weakness -Probably from above.  MRI brain unremarkable. -PT/OT recommend SNF placement. Patient initially adamantly refusing SNF placement but now has agreed for the same.  Social worker following.  Acute renal failure -Creatinine improved with IV hydration.  Lisinopril and Metformin on hold.  Diabetes mellitus type 2 -Continue CBGs with SSI.  Metformin on hold  History of unspecified stroke -Continue Plavix and  statin.   DVT prophylaxis: Heparin Code Status: DNR Family Communication: None at bedside Disposition Plan: Status is: Inpatient  Remains inpatient appropriate because:Inpatient level of care appropriate due to severity of illness.  SNF once bed is available   Dispo: The patient is from: Home              Anticipated d/c is to: SNF              Anticipated d/c date is: 1 day              Patient currently is medically stable to d/c.   Consultants: Orthopedic/Dr. Sharol Given  Procedures: Chopart amputation of right foot, with excision of the medial cuneiform on 03/24/2020 by Dr. Sharol Given Antimicrobials:  Anti-infectives (From admission, onward)   Start     Dose/Rate Route Frequency Ordered Stop   03/24/20 1400  ceFEPIme (MAXIPIME) 2 g in sodium chloride 0.9 % 100 mL IVPB        2 g 200 mL/hr over 30 Minutes Intravenous Every 8 hours 03/24/20 1241 03/25/20 2245   03/24/20 1215  ceFAZolin (ANCEF) IVPB 2g/100 mL premix  Status:  Discontinued        2 g 200 mL/hr over 30 Minutes Intravenous Every 6 hours 03/24/20 1207 03/24/20 1241   03/24/20 1100  metroNIDAZOLE (FLAGYL) IVPB 500 mg        500 mg 100 mL/hr over 60 Minutes Intravenous Every 8 hours 03/24/20 1007 03/25/20 2359   03/24/20 1030  ceFAZolin (ANCEF) IVPB 2g/100 mL premix  Status:  Discontinued        2 g 200 mL/hr over 30 Minutes Intravenous On call to O.R. 03/23/20 1452 03/24/20 1206   03/22/20 0600  ceFAZolin (ANCEF) 3 g in dextrose  5 % 50 mL IVPB        3 g 100 mL/hr over 30 Minutes Intravenous On call to O.R. 03/21/20 1214 03/23/20 0559   03/20/20 2200  vancomycin (VANCOREADY) IVPB 1500 mg/300 mL        1,500 mg 150 mL/hr over 120 Minutes Intravenous Every 24 hours 03/20/20 1510 03/25/20 2359   03/20/20 1600  vancomycin (VANCOREADY) IVPB 1250 mg/250 mL        1,250 mg 166.7 mL/hr over 90 Minutes Intravenous  Once 03/20/20 1510 03/20/20 1711   03/19/20 2315  cefTRIAXone (ROCEPHIN) 1 g in sodium chloride 0.9 % 100 mL IVPB         1 g 200 mL/hr over 30 Minutes Intravenous  Once 03/19/20 2308 03/20/20 0049       Subjective: Patient seen and examined at bedside.  No worsening lower extremity pain.  No fever, vomiting, worsening shortness of breath reported.   Objective: Vitals:   03/26/20 0500 03/26/20 0808 03/26/20 1609 03/26/20 2011  BP:  122/68 119/64 107/67  Pulse:  70 (!) 59 65  Resp:  16 17 16   Temp:  98.3 F (36.8 C) 97.8 F (36.6 C) 98.6 F (37 C)  TempSrc:  Oral Oral Oral  SpO2:  100% 100% 100%  Weight: 85 kg     Height:        Intake/Output Summary (Last 24 hours) at 03/27/2020 0740 Last data filed at 03/27/2020 0500 Gross per 24 hour  Intake 480 ml  Output 1675 ml  Net -1195 ml   Filed Weights   03/24/20 0500 03/24/20 1014 03/26/20 0500  Weight: 86.3 kg 86.3 kg 85 kg    Examination:  General exam: No distress.  Poor historian.  Elderly male lying on bed.  Intermittently gets upset Respiratory system: Bilateral decreased breath sounds at bases with scattered crackles  cardiovascular system: Intermittently bradycardic, S1-S2 heard Gastrointestinal system: Abdomen is nondistended, soft and nontender.  Bowel sounds heard.  No organomegaly  extremities: No cyanosis.  Right foot has a dressing  Data Reviewed: I have personally reviewed following labs and imaging studies  CBC: Recent Labs  Lab 03/23/20 0440 03/24/20 0609 03/25/20 0248 03/26/20 0045 03/27/20 0627  WBC 6.9 7.6 8.2 8.5 8.9  NEUTROABS 3.6 4.1 5.3 5.3 5.4  HGB 9.9* 9.8* 8.3* 10.1* 9.6*  HCT 30.8* 30.4* 26.0* 31.2* 29.5*  MCV 89.8 86.6 87.0 87.4 87.8  PLT 192 200 203 218 734   Basic Metabolic Panel: Recent Labs  Lab 03/23/20 0440 03/24/20 0609 03/25/20 0248 03/26/20 0045 03/27/20 0627  NA 140 138 138 138 138  K 3.4* 3.3* 3.1* 3.4* 3.7  CL 110 108 110 111 108  CO2 20* 21* 20* 18* 20*  GLUCOSE 89 95 102* 100* 97  BUN 12 10 10 12 11   CREATININE 1.04 1.03 1.05 1.06 1.01  CALCIUM 8.2* 8.0* 7.2* 7.8* 8.0*   MG  --   --  1.4* 1.6* 1.9   GFR: Estimated Creatinine Clearance: 69.2 mL/min (by C-G formula based on SCr of 1.01 mg/dL). Liver Function Tests: No results for input(s): AST, ALT, ALKPHOS, BILITOT, PROT, ALBUMIN in the last 168 hours. No results for input(s): LIPASE, AMYLASE in the last 168 hours. No results for input(s): AMMONIA in the last 168 hours. Coagulation Profile: No results for input(s): INR, PROTIME in the last 168 hours. Cardiac Enzymes: Recent Labs  Lab 03/20/20 1002  CKTOTAL 288   BNP (last 3 results) No results for input(s): PROBNP in  the last 8760 hours. HbA1C: No results for input(s): HGBA1C in the last 72 hours. CBG: Recent Labs  Lab 03/26/20 0657 03/26/20 1156 03/26/20 1554 03/26/20 2011 03/27/20 0635  GLUCAP 80 88 123* 118* 97   Lipid Profile: No results for input(s): CHOL, HDL, LDLCALC, TRIG, CHOLHDL, LDLDIRECT in the last 72 hours. Thyroid Function Tests: No results for input(s): TSH, T4TOTAL, FREET4, T3FREE, THYROIDAB in the last 72 hours. Anemia Panel: No results for input(s): VITAMINB12, FOLATE, FERRITIN, TIBC, IRON, RETICCTPCT in the last 72 hours. Sepsis Labs: Recent Labs  Lab 03/22/20 0029  LATICACIDVEN 0.9    Recent Results (from the past 240 hour(s))  Aerobic Culture (superficial specimen)     Status: None   Collection Time: 03/17/20  8:00 AM   Specimen: Heel  Result Value Ref Range Status   Specimen Description   Final    HEEL LEFT Performed at [place] [address], [place], [place] [address]    Special Requests   Final    NONE Performed at [facility], [place] [address] [place], [place] [address]    Gram Stain   Final    FEW WBC PRESENT, PREDOMINANTLY PMN FEW GRAM POSITIVE COCCI    Culture   Final    MODERATE METHICILLIN RESISTANT STAPHYLOCOCCUS AUREUS FEW GROUP B STREP(S.AGALACTIAE)ISOLATED TESTING AGAINST S. AGALACTIAE NOT ROUTINELY PERFORMED DUE TO PREDICTABILITY OF  AMP/PEN/VAN SUSCEPTIBILITY. Performed at [facility], [place] [address], [place], [place] [address]    Report Status 03/20/2020 FINAL  Final   Organism ID, Bacteria METHICILLIN RESISTANT STAPHYLOCOCCUS AUREUS  Final      Susceptibility   Methicillin resistant staphylococcus aureus - MIC*    CIPROFLOXACIN >=8 RESISTANT Resistant     ERYTHROMYCIN >=8 RESISTANT Resistant     GENTAMICIN <=0.5 SENSITIVE Sensitive     OXACILLIN >=4 RESISTANT Resistant     TETRACYCLINE <=1 SENSITIVE Sensitive     VANCOMYCIN 1 SENSITIVE Sensitive     TRIMETH/SULFA >=320 RESISTANT Resistant     CLINDAMYCIN <=0.25 SENSITIVE Sensitive     RIFAMPIN <=0.5 SENSITIVE Sensitive     Inducible Clindamycin NEGATIVE Sensitive     * MODERATE METHICILLIN RESISTANT STAPHYLOCOCCUS AUREUS  Blood culture (routine x 2)     Status: None   Collection Time: 03/19/20 11:59 PM   Specimen: BLOOD  Result Value Ref Range Status   Specimen Description   Final    BLOOD LEFT ANTECUBITAL Performed at [place] [address] [place], [place] [address]    Special Requests   Final    BOTTLES DRAWN AEROBIC AND ANAEROBIC Blood Culture adequate volume Performed at [place] [address] [place], [place] [address]    Culture   Final    NO GROWTH 5 DAYS Performed at [facility], [place] [address] [place], [place] [address]    Report Status 03/25/2020 FINAL  Final  Blood culture (routine x 2)     Status: None   Collection Time: 03/19/20 11:59 PM   Specimen: BLOOD  Result Value Ref Range Status   Specimen Description   Final    BLOOD RIGHT ANTECUBITAL Performed at [place] [address] [place], [place] [address]    Special Requests   Final    BOTTLES DRAWN AEROBIC ONLY Blood Culture results may not be optimal due to an inadequate volume of blood received in culture bottles Performed at [place]  [address] [place],  [place] [address]    Culture   Final    NO GROWTH 5 DAYS Performed at [facility], [place] [address], [place], [place] [address]    Report Status 03/25/2020 FINAL  Final  Urine culture     Status: Abnormal   Collection Time: 03/19/20 11:59 PM   Specimen: Urine, Random  Result Value Ref Range Status   Specimen Description   Final    URINE, RANDOM Performed at [place] [address], [place], [place] [address]    Special Requests   Final    NONE Performed at [facility], [place] [address], [place], [place] [address]    Culture (A)  Final    <10,000 COLONIES/mL INSIGNIFICANT GROWTH Performed at [place] [address], [place], [place] [address]    Report Status 03/21/2020 FINAL  Final  Respiratory Panel by RT PCR (Flu A&B, Covid) - Nasopharyngeal Swab     Status: None   Collection Time: 03/20/20  4:24 AM   Specimen: Nasopharyngeal Swab  Result Value Ref Range Status   SARS Coronavirus 2 by RT PCR NEGATIVE NEGATIVE Final    Comment: (NOTE) SARS-CoV-2 target nucleic acids are NOT DETECTED.  The SARS-CoV-2 RNA is generally detectable in upper respiratoy specimens during the acute phase of infection. The lowest concentration of SARS-CoV-2 viral copies this assay can detect is 131 copies/mL. A negative result does not preclude SARS-Cov-2 infection and should not be used as the sole basis for treatment or other patient management decisions. A negative result may occur with  improper specimen collection/handling, submission of specimen other than nasopharyngeal swab, presence of viral mutation(s) within the areas targeted by this assay, and inadequate number of viral copies (<131 copies/mL). A negative result must be combined with clinical observations, patient history, and epidemiological information. The expected result is Negative.  Fact Sheet for Patients:  PinkCheek.be  Fact Sheet for Healthcare  Providers:  GravelBags.it  This test is no t yet approved or cleared by the [place] FDA and  has been authorized for detection and/or diagnosis of SARS-CoV-2 by FDA under an Emergency Use Authorization (EUA). This EUA will remain  in effect (meaning this test can be used) for the duration of the COVID-19 declaration under Section 564(b)(1) of the Act, 21 U.S.C. section 360bbb-3(b)(1), unless the authorization is terminated or revoked sooner.     Influenza A by PCR NEGATIVE NEGATIVE Final   Influenza B by PCR NEGATIVE NEGATIVE Final    Comment: (NOTE) The Xpert Xpress SARS-CoV-2/FLU/RSV assay is intended as an aid in  the diagnosis of influenza from Nasopharyngeal swab specimens and  should not be used as a sole basis for treatment. Nasal washings and  aspirates are unacceptable for Xpert Xpress SARS-CoV-2/FLU/RSV  testing.  Fact Sheet for Patients: PinkCheek.be  Fact Sheet for Healthcare Providers: GravelBags.it  This test is not yet approved or cleared by the [place] FDA and  has been authorized for detection and/or diagnosis of SARS-CoV-2 by  FDA under an Emergency Use Authorization (EUA). This EUA will remain  in effect (meaning this test can be used) for the duration of the  Covid-19 declaration under Section 564(b)(1) of the Act, 21  U.S.C. section 360bbb-3(b)(1), unless the authorization is  terminated or revoked. Performed at [facility], [place] [address] [place],  [address]   Surgical pcr screen     Status: Abnormal   Collection Time: 03/23/20  8:09  PM   Specimen: Nasal Mucosa; Nasal Swab  Result Value Ref Range Status   MRSA, PCR POSITIVE (A) NEGATIVE Final    Comment: RESULT CALLED TO, READ BACK BY AND VERIFIED WITH: V SNEIDERMAN RN 03/23/20 AT 2238 SK    Staphylococcus aureus POSITIVE (A) NEGATIVE Final    Comment: RESULT CALLED TO, READ  BACK BY AND VERIFIED WITH: Andres Labrum RN 03/23/20 AT 2238 SK (NOTE) The Xpert SA Assay (FDA approved for NASAL specimens in patients 46 years of age and older), is one component of a comprehensive surveillance program. It is not intended to diagnose infection nor to guide or monitor treatment. Performed at [facility], [place] [address], [place], [place] [address]   SARS Coronavirus 2 by RT PCR (hospital order, performed in [facility] hospital lab) Nasopharyngeal Nasopharyngeal Swab     Status: None   Collection Time: 03/27/20  4:54 AM   Specimen: Nasopharyngeal Swab  Result Value Ref Range Status   SARS Coronavirus 2 NEGATIVE NEGATIVE Final    Comment: (NOTE) SARS-CoV-2 target nucleic acids are NOT DETECTED.  The SARS-CoV-2 RNA is generally detectable in upper and lower respiratory specimens during the acute phase of infection. The lowest concentration of SARS-CoV-2 viral copies this assay can detect is 250 copies / mL. A negative result does not preclude SARS-CoV-2 infection and should not be used as the sole basis for treatment or other patient management decisions.  A negative result may occur with improper specimen collection / handling, submission of specimen other than nasopharyngeal swab, presence of viral mutation(s) within the areas targeted by this assay, and inadequate number of viral copies (<250 copies / mL). A negative result must be combined with clinical observations, patient history, and epidemiological information.  Fact Sheet for Patients:   StrictlyIdeas.no  Fact Sheet for Healthcare Providers: BankingDealers.co.za  This test is not yet approved or  cleared by the [place] FDA and has been authorized for detection and/or diagnosis of SARS-CoV-2 by FDA under an Emergency Use Authorization (EUA).  This EUA will remain in effect (meaning this test can be used) for the duration of the COVID-19  declaration under Section 564(b)(1) of the Act, 21 U.S.C. section 360bbb-3(b)(1), unless the authorization is terminated or revoked sooner.  Performed at [facility], [place] [address] [place], [place] [address]          Radiology Studies: No results found.      Scheduled Meds: . atorvastatin  40 mg Oral Daily  . clopidogrel  75 mg Oral Q breakfast  . cycloSPORINE  1 drop Both Eyes BID  . gabapentin  300 mg Oral BID  . heparin  5,000 Units Subcutaneous Q8H  . insulin aspart  0-9 Units Subcutaneous TID WC  . latanoprost  1 drop Both Eyes QHS  . mupirocin ointment  1 application Nasal BID  . pregabalin  50 mg Oral BID  . timolol  1 drop Both Eyes BID   Continuous Infusions: . lactated ringers 10 mL/hr at 03/24/20 1102          Lauria Depoy, MD [facility] 03/27/2020, 7:40 AM

## 2020-03-27 NOTE — TOC Progression Note (Addendum)
Transition of Care [facility]) - Progression Note    Patient Details  Name: Bryden Darden MRN: [id number] Date of Birth: Jul 24, 1940  Transition of Care [facility]) CM/SW [place], RN Phone Number: 03/27/2020, 1:40 PM  Clinical Narrative:    Case management spoke with the patient's niece on the phone and offered Medicare choice regarding SNF placement.  The niece was given a list of bed offers and chose [facility].  I called [facility] and left a message with Janie, CM to check on bed availability.  Insurance authorization will be started and case management will follow for authorization which may take a couple of days for approval.  The patient is fully immunized for COVID. - 03/27/20 - [place] called and insurance authorization started for [facility] - ref # [id number] and clinicals were faxed to [facility] at [phone].   Expected Discharge Plan: [place] Barriers to Discharge: Continued Medical Work up  Expected Discharge Plan and Services Expected Discharge Plan: [place]   Discharge Planning Services: CM Consult Post Acute Care Choice: [place] arrangements for the past 2 months: Apartment                                       Social Determinants of Health (SDOH) Interventions    Readmission Risk Interventions Readmission Risk Prevention Plan 11/01/2018  Transportation Screening Complete  PCP or Specialist Appt within 5-7 Days Complete  Home Care Screening Complete  Medication Review (RN CM) Complete  Some recent data might be hidden

## 2020-03-27 NOTE — Care Management Important Message (Signed)
Important Message  Patient Details  Name: [name] MRN: [id number] Date of Birth: Jan 04, 1941   Medicare Important Message Given:  Yes - Important Message mailed due to current National Emergency  Verbal consent obtained due to current National Emergency  Relationship to patient: Self Contact Name: Akai Dollard Call Date: 03/27/20  Time: 1226 Phone: [phone] Outcome: No Answer/Busy Important Message mailed to: Patient address on file    Delorse Lek 03/27/2020, 12:26 PM

## 2020-03-27 NOTE — Progress Notes (Signed)
Physical Therapy Treatment Patient Details Name: [name] MRN: [id number] DOB: July 28, 1940 Today's Date: 03/27/2020    History of Present Illness Pt is a 79 y.o. 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    PT Comments    Pt supine on arrival, agreeable to therapy session with fair participation and tolerance for session. Pt continues to demonstrate difficulty with maintaining RLE TDWB during stand pivot transfers from bed>chair with RW, needing modA and cues for technique. Pt given visual demo but will need reinforcement and may benefit from drop arm recliner in room to attempt lateral seated scoot transfer rather than stand pivot due to difficulty maintaining precautions. Pt encouraged to complete supine BLE HEP (AP, GS, QS xTID) as able to maintain BLE strength. Pt continues to benefit from PT services to progress toward functional mobility goals. Continue to recommend SNF level of rehab.  Follow Up Recommendations  SNF;Supervision/Assistance - 24 hour     Equipment Recommendations  3in1 (PT);Wheelchair (measurements PT);Wheelchair cushion (measurements PT)    Recommendations for Other Services       Precautions / Restrictions Precautions Precautions: Fall Restrictions Weight Bearing Restrictions: Yes RLE Weight Bearing: Touchdown weight bearing    Mobility  Bed Mobility Overal bed mobility: Needs Assistance Bed Mobility: Supine to Sit;Sit to Supine     Supine to sit: Min assist        Transfers Overall transfer level: Needs assistance Equipment used: Rolling walker (2 wheeled) Transfers: Sit to/from Stand Sit to Stand: Mod assist         General transfer comment: Mod A for sit to stand at bedside, posterior lean noted. Requires external support to  maintain balance, placing full weight on affected LE despite cues. Deferred further mobility due to poor following of precautions  Ambulation/Gait Ambulation/Gait assistance: Mod assist Gait Distance (Feet): 3 Feet Assistive device: Rolling walker (2 wheeled) Gait Pattern/deviations: Antalgic;Decreased weight shift to right;Decreased step length - right;Decreased step length - left;Step-to pattern;Leaning posteriorly Gait velocity: decreased Gait velocity interpretation: <1.8 ft/sec, indicate of risk for recurrent falls General Gait Details: Max cues for weightbearing status and walker proximity; pt unable to maintain. ModA for stability   Stairs             Wheelchair Mobility    Modified Rankin (Stroke Patients Only)       Balance Overall balance assessment: Needs assistance Sitting-balance support: Feet supported;Bilateral upper extremity supported Sitting balance-Leahy Scale: Fair     Standing balance support: Bilateral upper extremity supported Standing balance-Leahy Scale: Poor Standing balance comment: reliant on UE support on RW and external support from therapist                            Cognition Arousal/Alertness: Awake/alert Behavior During Therapy: Flat affect Overall Cognitive Status: Impaired/Different from baseline Area of Impairment: Safety/judgement;Problem solving;Awareness                         Safety/Judgement: Decreased awareness of safety;Decreased awareness of deficits   Problem Solving: Difficulty sequencing;Requires verbal cues;Requires tactile cues General Comments: Pt with poor awareness of deficits and recall/carryover of precautions. Pt noncompliant with RLE TDWB precautions despite visual/verbal cues      Exercises      General Comments  Pertinent Vitals/Pain Pain Assessment: Faces Faces Pain Scale: Hurts little more Pain Location: surgical site Pain Descriptors / Indicators:  Burning;Aching;Sore Pain Intervention(s): Limited activity within patient's tolerance;Monitored during session;Repositioned    Home Living                      Prior Function            PT Goals (current goals can now be found in the care plan section) Acute Rehab PT Goals Patient Stated Goal: go home PT Goal Formulation: With patient Time For Goal Achievement: 04/08/20 Potential to Achieve Goals: Fair Progress towards PT goals: Progressing toward goals    Frequency    Min 3X/week      PT Plan Current plan remains appropriate    Co-evaluation              AM-PAC PT "6 Clicks" Mobility   Outcome Measure  Help needed turning from your back to your side while in a flat bed without using bedrails?: None Help needed moving from lying on your back to sitting on the side of a flat bed without using bedrails?: A Little Help needed moving to and from a bed to a chair (including a wheelchair)?: A Lot Help needed standing up from a chair using your arms (e.g., wheelchair or bedside chair)?: A Lot Help needed to walk in hospital room?: Total Help needed climbing 3-5 steps with a railing? : Total 6 Click Score: 13    End of Session Equipment Utilized During Treatment: Gait belt (post-op shoe) Activity Tolerance: Patient tolerated treatment well Patient left: in chair;with call bell/phone within reach;with family/visitor present;with chair alarm set Nurse Communication: Mobility status;Weight bearing status;Other (comment) (RN notified to get +2 assist for return to bed) PT Visit Diagnosis: Unsteadiness on feet (R26.81);Other abnormalities of gait and mobility (R26.89);Difficulty in walking, not elsewhere classified (R26.2);Pain Pain - Right/Left: Right Pain - part of body: Ankle and joints of foot     Time: 2119-4174 PT Time Calculation (min) (ACUTE ONLY): 19 min  Charges:  $Therapeutic Activity: 8-22 mins                     Cystal Shannahan P., PTA Acute  Rehabilitation Services Pager: [phone] Office: [place] 03/27/2020, 2:22 PM

## 2020-03-28 LAB — GLUCOSE, CAPILLARY
Glucose-Capillary: 111 mg/dL — ABNORMAL HIGH (ref 70–99)
Glucose-Capillary: 99 mg/dL (ref 70–99)

## 2020-03-28 LAB — CREATININE, SERUM
Creatinine, Ser: 1.03 mg/dL (ref 0.61–1.24)
GFR, Estimated: >60 mL/min (ref >60)

## 2020-03-28 MED ORDER — PREGABALIN 50 MG PO CAPS: 50.0000 mg | ORAL_CAPSULE | Freq: Two times a day (BID) | ORAL | Status: DC

## 2020-03-28 MED ORDER — TRAMADOL HCL 50 MG PO TABS
50.0000 mg | ORAL_TABLET | Freq: Four times a day (QID) | ORAL | 0 refills | Status: DC | PRN
Start: 2020-03-28 — End: 2021-03-13

## 2020-03-28 MED ORDER — DORZOLAMIDE HCL-TIMOLOL MAL PF 2-0.5 % OP SOLN: 1.0000 [drp] | Freq: Two times a day (BID) | OPHTHALMIC | Status: DC

## 2020-03-28 NOTE — Progress Notes (Signed)
Patient is status post excision of medial cuneiform.  He is doing well this morning sitting up in bed appropriate to questions.  He is agreeable to going to a nursing facility.   Vital signs stable dressing clean dry intact.  Patient will have a 1 week follow-up in our office at which time we will change the dressing.  We will leave pain medication prescription on his chart

## 2020-03-28 NOTE — Progress Notes (Signed)
Attempted to call report to [facility], someone answered and responded that a team member will call me back for report as soon as they can.

## 2020-03-28 NOTE — Discharge Summary (Addendum)
Physician Discharge Summary  [name] TML:[id number] DOB: 23-Feb-1941 DOA: 03/19/2020  PCP: [name], MD  Admit date: 03/19/2020 Discharge date: 03/28/2020  Admitted From: Home Disposition: SNF  Recommendations for Outpatient Follow-up:  1. Follow up with SNF provider at earliest convenience with repeat CBC/BMP within a week 2. Outpatient follow-up with orthopedics.  Wound care/activity/discharge pain management as per orthopedic recommendations 3. Follow up in ED if symptoms worsen or new appear   Home Health: No Equipment/Devices: None  Discharge Condition: Stable CODE STATUS: DNR Diet recommendation: Heart healthy/carb modified  Brief/Interim Summary: 79 y.o.malewithhistory of previous unspecified stroke, hypertension, diabetes mellitus type II, bilateral transmetatarsal amputation of both feet presented with increasing weakness.  On presentation, WBCs was 21.3.  Urinalysis and chest x-ray were unremarkable.  COVID-19 PCR was negative.  He was found to have open wounds on the sole of both partially amputated feet.  X-ray of the right foot showed findings concerning for osteomyelitis of the medial cuneiform bone.  MRI right foot showed acute osteomyelitis of the medial cuneiform bone.  He was started on broad-spectrum antibiotics.  Orthopedics was consulted.  He was transferred to [facility] on 03/23/2020 and subsequently underwent surgical intervention on 03/23/2020.  Antibiotics were discontinued on 03/24/2020 as per Dr. Jess Barters recommendations.  PT recommended SNF placement.  Orthopedics has cleared the patient for discharge to SNF.  Discharge patient to SNF once bed is available.  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 -MRI of right foot showed acute osteomyelitis. -Treated with broad-spectrum antibiotics. -Status post Chopart amputation of right foot, with excision  of the medial cuneiform on 03/24/2020 by Dr. Sharol Given.  DC'd antibiotics 24 hours after procedure. -PT recommended SNF placement. -Orthopedics has cleared the patient for discharge to SNF.  Wound care/activity/discharge pain management as per orthopedic recommendations. -Discharge patient to SNF once bed is available. -Outpatient follow-up with orthopedics.  Sepsis: Present on admission -Probably from above.  Cultures have been negative so far.  Currently hemodynamically stable. -Sepsis has resolved. Antibiotics have been discontinued.  Generalized weakness -Probably from above.  MRI brain unremarkable. -PT/OT recommend SNF placement.  Acute renal failure -Creatinine improved with IV hydration.    Resume Metformin on discharge.  Diabetes mellitus type 2 -Carb modified diet.  Resume Metformin.  Blood sugars on the lower side.  Hypertension -Blood pressure on the lower side.  We will keep lisinopril on hold for now.  History of unspecified stroke -Continue Plavix and statin.   Discharge Instructions  Discharge Instructions    Diet - low sodium heart healthy   Complete by: As directed    Diet Carb Modified   Complete by: As directed    Discharge wound care:   Complete by: As directed    Discharge wound care as per orthopedics recommendations.   Increase activity slowly   Complete by: As directed      Allergies as of 03/28/2020   No Known Allergies     Medication List    STOP taking these medications   Alphagan P 0.1 % Soln Generic drug: brimonidine   lisinopril 2.5 MG tablet Commonly known as: ZESTRIL   timolol 0.5 % ophthalmic solution Commonly known as: TIMOPTIC     TAKE these medications   acetaminophen 650 MG CR tablet Commonly known as: TYLENOL Take 650 mg by mouth every 8 (eight) hours as needed for pain.   atorvastatin 40 MG tablet Commonly known as: LIPITOR Take 40  mg by mouth daily.   clopidogrel 75 MG tablet Commonly known as: PLAVIX Take 1  tablet (75 mg total) by mouth daily.   cycloSPORINE 0.05 % ophthalmic emulsion Commonly known as: RESTASIS Place 1 drop into both eyes 2 (two) times daily.   Dorzolamide HCl-Timolol Mal PF 2-0.5 % Soln Place 1 drop into both eyes 2 (two) times daily. What changed:   how much to take  how to take this  when to take this   gabapentin 300 MG capsule Commonly known as: NEURONTIN Take 300 mg by mouth 2 (two) times daily.   latanoprost 0.005 % ophthalmic solution Commonly known as: XALATAN Place 1 drop into both eyes at bedtime.   metFORMIN 500 MG tablet Commonly known as: GLUCOPHAGE Take 500 mg by mouth daily.   multivitamin with minerals Tabs tablet Take 1 tablet by mouth daily. centrum   pregabalin 50 MG capsule Commonly known as: LYRICA Take 1 capsule (50 mg total) by mouth 2 (two) times daily.   traMADol 50 MG tablet Commonly known as: ULTRAM Take 1 tablet (50 mg total) by mouth every 6 (six) hours as needed for moderate pain.            Discharge Care Instructions  (From admission, onward)         Start     Ordered   03/28/20 0000  Discharge wound care:       Comments: Discharge wound care as per orthopedics recommendations.   03/28/20 0850           Follow-up Information    Suzan Slick, NP Follow up.   Specialty: Orthopedic Surgery Contact information: [address] [place] [address] [phone]              No Known Allergies  Consultations:  Orthopedic surgery   Procedures/Studies: MR BRAIN WO CONTRAST  Result Date: 03/20/2020 CLINICAL DATA:  Altered mental status EXAM: MRI HEAD WITHOUT CONTRAST TECHNIQUE: Multiplanar, multiecho pulse sequences of the brain and surrounding structures were obtained without intravenous contrast. COMPARISON:  02/05/2020 FINDINGS: Brain: There is no acute infarction or intracranial hemorrhage. There are small cortical/subcortical infarcts of the right frontal and occipital lobes. Additional  patchy T2 hyperintensity in the supratentorial white matter is nonspecific but probably reflects stable mild chronic microvascular ischemic changes. There is no intracranial mass, mass effect, or edema. There is no hydrocephalus or extra-axial fluid collection. Vascular: Major vessel flow voids at the skull base are preserved. Skull and upper cervical spine: Normal marrow signal is preserved. Sinuses/Orbits: Paranasal sinuses are aerated. Bilateral lens replacements. Other: Sella is unremarkable.  Mastoid air cells are clear. IMPRESSION: No evidence of recent infarction, hemorrhage, or mass. Stable chronic findings detailed above. Electronically Signed   By: Macy Mis M.D.   On: 03/20/2020 07:58   MR FOOT RIGHT WO CONTRAST  Result Date: 03/20/2020 CLINICAL DATA:  Nonhealing ulceration EXAM: MRI OF THE RIGHT FOREFOOT WITHOUT CONTRAST TECHNIQUE: Multiplanar, multisequence MR imaging of the right forefoot was performed. No intravenous contrast was administered. COMPARISON:  X-ray 03/20/2020.  MRI 03/14/2020 FINDINGS: Status post first ray resection and proximal transmetatarsal amputation of the second-fifth rays. Ulceration is again noted along the plantar aspect of the residual forefoot. Diffuse bone marrow edema throughout the medial cuneiform with associated patchy low T1 marrow signal compatible with acute osteomyelitis. Small heterogeneously T2 hyperintense fluid collection adjacent to the plantar margin of the medial cuneiform measuring 1.5 x 0.5 x 0.9 cm (series 4, image 41). There  is subtle patchy marrow edema within the intermediate cuneiform and second metatarsal base (series 6, image 13). Bone marrow signal intensity within the third, fourth, and fifth metatarsal bases as well as the lateral cuneiform, navicular, and cuboid are within normal limits. The osseous structures of the ankle and hindfoot are intact. Chronic atrophy and fatty infiltration of the intrinsic foot musculature. Post amputation  changes to the flexor and extensor tendons. No tenosynovitis. IMPRESSION: 1. Acute osteomyelitis of the medial cuneiform. 2. Subtle patchy marrow edema within the intermediate cuneiform and second metatarsal base may be reactive or represent early acute osteomyelitis. 3. Small fluid collection adjacent to the plantar margin of the medial cuneiform measuring 1.5 x 0.5 x 0.9 cm, which may reflect a small abscess. Electronically Signed   By: Davina Poke D.O.   On: 03/20/2020 08:28   MR FOOT RIGHT W WO CONTRAST  Result Date: 03/15/2020 CLINICAL DATA:  Nonhealing ulcer involving the plantar aspect of the right foot. History of prior proximal metatarsal amputation. EXAM: MRI OF THE RIGHT FOREFOOT WITHOUT AND WITH CONTRAST TECHNIQUE: Multiplanar, multisequence MR imaging of the right foot was performed before and after the administration of intravenous contrast. CONTRAST:  48mL GADAVIST GADOBUTROL 1 MMOL/ML IV SOLN COMPARISON:  Radiographs 12/16/2019 FINDINGS: There is a soft tissue ulceration noted along the medial plantar aspect of the foot near the amputation site. Localized subcutaneous soft tissue swelling/edema and probable granulation tissue. There also appears to be some nonenhancing soft tissue which could be necrotic tissue. Underlying marked abnormal T1 and T2 signal intensity in the medial cuneiform and also diffuse abnormal contrast enhancement consistent with osteomyelitis. The second through fifth metatarsal bases are intact and the other cuneiform is appear normal. The cuboid is unremarkable. No other sites of osteomyelitis are identified. I do not see any definite findings for septic arthritis. Incidental fairly significant hindfoot valgus is noted. Mild diffuse forefoot cellulitis. No significant findings for myofasciitis or pyomyositis. The ankle and subtalar joints are maintained. Sinus tarsi is grossly normal. IMPRESSION: 1. Soft tissue ulceration along the medial plantar aspect of the foot  near the amputation site. Localized subcutaneous soft tissue swelling/edema and probable granulation tissue. There also appears to be some nonenhancing soft tissue which could be necrotic tissue. 2. Osteomyelitis involving the medial cuneiform. 3. No other sites of osteomyelitis or septic arthritis. 4. Mild diffuse cellulitis. Electronically Signed   By: Marijo Sanes M.D.   On: 03/15/2020 10:26   DG Chest Port 1 View  Result Date: 03/19/2020 CLINICAL DATA:  Generalized weakness x2 days. EXAM: PORTABLE CHEST 1 VIEW COMPARISON:  Nov 01, 2018 FINDINGS: The cardiac silhouette is mildly enlarged and unchanged in size. Both lungs are clear. Chronic and degenerative changes seen involving the right shoulder and thoracic spine. IMPRESSION: No active disease. Electronically Signed   By: Virgina Norfolk M.D.   On: 03/19/2020 22:33   DG Foot Complete Left  Result Date: 03/20/2020 CLINICAL DATA:  Bilateral foot ulcer EXAM: LEFT FOOT - COMPLETE 3+ VIEW COMPARISON:  December 16, 2019 FINDINGS: The patient is status post transmetatarsal amputation. Again noted is advanced arthropathy within the midfoot with ankylosis. No focal areas of cortical destruction or periosteal reaction. Calcaneal enthesophyte at the Achilles insertion site. Mild skin thickening and subcutaneous edema seen on the plantar surface. IMPRESSION: No definite acute osseous abnormality. Skin thickening and subcutaneous edema on the plantar surface. Electronically Signed   By: Prudencio Pair M.D.   On: 03/20/2020 03:01   DG Foot Complete  Right  Result Date: 03/20/2020 CLINICAL DATA:  Ulcers. EXAM: RIGHT FOOT COMPLETE - 3+ VIEW COMPARISON:  December 16, 2018 FINDINGS: Again noted are changes related to a prior transmetatarsal amputation. There is increasing lucency within the remaining medial cuneiform. There is persistent lucency in the plantar calcaneus, unchanged from prior. Plantar ulcers are again noted. Vascular calcifications are again visualized.  IMPRESSION: 1. Findings are again concerning for osteomyelitis involving the medial cuneiform. 2. Soft tissue ulcer is again noted. Electronically Signed   By: Constance Holster M.D.   On: 03/20/2020 03:02       Subjective: Patient seen and examined at bedside.  Poor historian.  Denies any worsening leg pain.  No overnight fever, vomiting or shortness of breath reported.  Discharge Exam: Vitals:   03/27/20 2025 03/28/20 0605  BP: (!) 90/46 117/61  Pulse: 66 61  Resp: 17 16  Temp: 99.1 F (37.3 C) 98.8 F (37.1 C)  SpO2: 99% 100%    General: Pt is alert, awake, not in acute distress.  Poor historian Cardiovascular: rate controlled, S1/S2 + Respiratory: bilateral decreased breath sounds at bases Abdominal: Soft, NT, ND, bowel sounds + Extremities: Right foot dressing present; no cyanosis    The results of significant diagnostics from this hospitalization (including imaging, microbiology, ancillary and laboratory) are listed below for reference.     Microbiology: Recent Results (from the past 240 hour(s))  Blood culture (routine x 2)     Status: None   Collection Time: 03/19/20 11:59 PM   Specimen: BLOOD  Result Value Ref Range Status   Specimen Description   Final    BLOOD LEFT ANTECUBITAL Performed at [facility] [address] [place], [place] [address]    Special Requests   Final    BOTTLES DRAWN AEROBIC AND ANAEROBIC Blood Culture adequate volume Performed at [facility] [address] [place], [place] [address]    Culture   Final    NO GROWTH 5 DAYS Performed at [facility], [place] [address], [place], [place] [address]    Report Status 03/25/2020 FINAL  Final  Blood culture (routine x 2)     Status: None   Collection Time: 03/19/20 11:59 PM   Specimen: BLOOD  Result Value Ref Range Status   Specimen Description   Final    BLOOD RIGHT ANTECUBITAL Performed at [facility] [address], [place], [place] [address]    Special Requests   Final    BOTTLES DRAWN AEROBIC ONLY Blood Culture results may not be optimal due to an inadequate volume of blood received in culture bottles Performed at [facility] [address] [place], [place] [address]    Culture   Final    NO GROWTH 5 DAYS Performed at [facility], [place] [address] [place], [place] [address]    Report Status 03/25/2020 FINAL  Final  Urine culture     Status: Abnormal   Collection Time: 03/19/20 11:59 PM   Specimen: Urine, Random  Result Value Ref Range Status   Specimen Description   Final    URINE, RANDOM Performed at [facility] [address], [place], [place] [address]    Special Requests   Final    NONE Performed at [facility], [place] [address], [place],  [address]    Culture (A)  Final    <10,000 COLONIES/mL INSIGNIFICANT GROWTH Performed at [facility], [address] [place], [place] [address]    Report Status 03/21/2020 FINAL  Final  Respiratory Panel by RT PCR (Flu A&B, Covid) - Nasopharyngeal Swab     Status: None   Collection Time: 03/20/20  4:24 AM   Specimen: Nasopharyngeal Swab  Result Value Ref Range Status   SARS Coronavirus 2 by RT PCR NEGATIVE NEGATIVE Final    Comment: (NOTE) SARS-CoV-2 target nucleic acids are NOT DETECTED.  The SARS-CoV-2 RNA is generally detectable in upper respiratoy specimens during the acute phase of infection. The lowest concentration of SARS-CoV-2 viral copies this assay can detect is 131 copies/mL. A negative result does not preclude SARS-Cov-2 infection and should not be used as the sole basis for treatment or other patient management decisions. A negative result may occur with  improper specimen collection/handling, submission of specimen other than nasopharyngeal swab, presence of viral mutation(s) within the areas targeted by this assay, and inadequate number of viral  copies (<131 copies/mL). A negative result must be combined with clinical observations, patient history, and epidemiological information. The expected result is Negative.  Fact Sheet for Patients:  PinkCheek.be  Fact Sheet for Healthcare Providers:  GravelBags.it  This test is no t yet approved or cleared by the [place] FDA and  has been authorized for detection and/or diagnosis of SARS-CoV-2 by FDA under an Emergency Use Authorization (EUA). This EUA will remain  in effect (meaning this test can be used) for the duration of the COVID-19 declaration under Section 564(b)(1) of the Act, 21 U.S.C. section 360bbb-3(b)(1), unless the authorization is terminated or revoked sooner.     Influenza A by PCR NEGATIVE NEGATIVE Final   Influenza B by PCR NEGATIVE NEGATIVE Final    Comment: (NOTE) The Xpert Xpress SARS-CoV-2/FLU/RSV assay is intended as an aid in  the diagnosis of influenza from Nasopharyngeal swab specimens and  should not be used as a sole basis for treatment. Nasal washings and  aspirates are unacceptable for Xpert Xpress SARS-CoV-2/FLU/RSV  testing.  Fact Sheet for Patients: PinkCheek.be  Fact Sheet for Healthcare Providers: GravelBags.it  This test is not yet approved or cleared by the [place] FDA and  has been authorized for detection and/or diagnosis of SARS-CoV-2 by  FDA under an Emergency Use Authorization (EUA). This EUA will remain  in effect (meaning this test can be used) for the duration of the  Covid-19 declaration under Section 564(b)(1) of the Act, 21  U.S.C. section 360bbb-3(b)(1), unless the authorization is  terminated or revoked. Performed at [facility], [place] [address], [place], [place] [address]   Surgical pcr screen     Status: Abnormal   Collection Time: 03/23/20  8:09 PM   Specimen: Nasal Mucosa;  Nasal Swab  Result Value Ref Range Status   MRSA, PCR POSITIVE (A) NEGATIVE Final    Comment: RESULT CALLED TO, READ BACK BY AND VERIFIED WITH: V SNEIDERMAN RN 03/23/20 AT 2238 SK    Staphylococcus aureus POSITIVE (A) NEGATIVE Final    Comment: RESULT CALLED TO, READ BACK BY AND VERIFIED WITH: Andres Labrum RN 03/23/20 AT 2238 SK (NOTE) The Xpert SA Assay (FDA approved for NASAL specimens in patients 33 years of age and older), is one component of a comprehensive surveillance program. It is not intended to diagnose infection nor to guide or monitor treatment. Performed at [facility], [place] [address], [place], [place] [address]   SARS Coronavirus 2 by RT PCR (hospital order, performed in [facility] hospital  lab) Nasopharyngeal Nasopharyngeal Swab     Status: None   Collection Time: 03/27/20  4:54 AM   Specimen: Nasopharyngeal Swab  Result Value Ref Range Status   SARS Coronavirus 2 NEGATIVE NEGATIVE Final    Comment: (NOTE) SARS-CoV-2 target nucleic acids are NOT DETECTED.  The SARS-CoV-2 RNA is generally detectable in upper and lower respiratory specimens during the acute phase of infection. The lowest concentration of SARS-CoV-2 viral copies this assay can detect is 250 copies / mL. A negative result does not preclude SARS-CoV-2 infection and should not be used as the sole basis for treatment or other patient management decisions.  A negative result may occur with improper specimen collection / handling, submission of specimen other than nasopharyngeal swab, presence of viral mutation(s) within the areas targeted by this assay, and inadequate number of viral copies (<250 copies / mL). A negative result must be combined with clinical observations, patient history, and epidemiological information.  Fact Sheet for Patients:   StrictlyIdeas.no  Fact Sheet for Healthcare Providers: BankingDealers.co.za  This test is not yet  approved or  cleared by the [place] FDA and has been authorized for detection and/or diagnosis of SARS-CoV-2 by FDA under an Emergency Use Authorization (EUA).  This EUA will remain in effect (meaning this test can be used) for the duration of the COVID-19 declaration under Section 564(b)(1) of the Act, 21 U.S.C. section 360bbb-3(b)(1), unless the authorization is terminated or revoked sooner.  Performed at [facility], [place] [address] [place], [place] [address]      Labs: BNP (last 3 results) No results for input(s): BNP in the last 8760 hours. Basic Metabolic Panel: Recent Labs  Lab 03/23/20 0440 03/23/20 0440 03/24/20 0609 03/25/20 0248 03/26/20 0045 03/27/20 0627 03/28/20 0406  NA 140  --  138 138 138 138  --   K 3.4*  --  3.3* 3.1* 3.4* 3.7  --   CL 110  --  108 110 111 108  --   CO2 20*  --  21* 20* 18* 20*  --   GLUCOSE 89  --  95 102* 100* 97  --   BUN 12  --  10 10 12 11   --   CREATININE 1.04   < > 1.03 1.05 1.06 1.01 1.03  CALCIUM 8.2*  --  8.0* 7.2* 7.8* 8.0*  --   MG  --   --   --  1.4* 1.6* 1.9  --    < > = values in this interval not displayed.   Liver Function Tests: No results for input(s): AST, ALT, ALKPHOS, BILITOT, PROT, ALBUMIN in the last 168 hours. No results for input(s): LIPASE, AMYLASE in the last 168 hours. No results for input(s): AMMONIA in the last 168 hours. CBC: Recent Labs  Lab 03/23/20 0440 03/24/20 0609 03/25/20 0248 03/26/20 0045 03/27/20 0627  WBC 6.9 7.6 8.2 8.5 8.9  NEUTROABS 3.6 4.1 5.3 5.3 5.4  HGB 9.9* 9.8* 8.3* 10.1* 9.6*  HCT 30.8* 30.4* 26.0* 31.2* 29.5*  MCV 89.8 86.6 87.0 87.4 87.8  PLT 192 200 203 218 280   Cardiac Enzymes: No results for input(s): CKTOTAL, CKMB, CKMBINDEX, TROPONINI in the last 168 hours. BNP: Invalid input(s): POCBNP CBG: Recent Labs  Lab 03/27/20 0635 03/27/20 1157 03/27/20 1736 03/27/20 2121 03/28/20 0813  GLUCAP 97 78 86 121* 111*   D-Dimer No results for input(s):  DDIMER in the last 72 hours. Hgb A1c No results for input(s): HGBA1C in the last  72 hours. Lipid Profile No results for input(s): CHOL, HDL, LDLCALC, TRIG, CHOLHDL, LDLDIRECT in the last 72 hours. Thyroid function studies No results for input(s): TSH, T4TOTAL, T3FREE, THYROIDAB in the last 72 hours.  Invalid input(s): FREET3 Anemia work up No results for input(s): VITAMINB12, FOLATE, FERRITIN, TIBC, IRON, RETICCTPCT in the last 72 hours. Urinalysis    Component Value Date/Time   COLORURINE YELLOW 03/19/2020 [place] 03/19/2020 2359   LABSPEC 1.020 03/19/2020 2359   PHURINE 5.0 03/19/2020 2359   GLUCOSEU NEGATIVE 03/19/2020 2359   HGBUR SMALL (A) 03/19/2020 2359   HGBUR trace-intact 09/16/2007 1115   BILIRUBINUR NEGATIVE 03/19/2020 2359   KETONESUR NEGATIVE 03/19/2020 2359   PROTEINUR NEGATIVE 03/19/2020 2359   UROBILINOGEN 1.0 10/24/2013 1220   NITRITE NEGATIVE 03/19/2020 2359   LEUKOCYTESUR NEGATIVE 03/19/2020 2359   Sepsis Labs Invalid input(s): PROCALCITONIN,  WBC,  LACTICIDVEN Microbiology Recent Results (from the past 240 hour(s))  Blood culture (routine x 2)     Status: None   Collection Time: 03/19/20 11:59 PM   Specimen: BLOOD  Result Value Ref Range Status   Specimen Description   Final    BLOOD LEFT ANTECUBITAL Performed at [facility], [place] [address] [place], [place] [address]    Special Requests   Final    BOTTLES DRAWN AEROBIC AND ANAEROBIC Blood Culture adequate volume Performed at [facility] [address] [place], [place] [address]    Culture   Final    NO GROWTH 5 DAYS Performed at [facility], [place] [address] [place], [place] [address]    Report Status 03/25/2020 FINAL  Final  Blood culture (routine x 2)     Status: None   Collection Time: 03/19/20 11:59 PM   Specimen: BLOOD  Result Value Ref Range Status   Specimen Description   Final    BLOOD RIGHT ANTECUBITAL Performed at  [facility] [address] [place] [address], [place], [place] [address]    Special Requests   Final    BOTTLES DRAWN AEROBIC ONLY Blood Culture results may not be optimal due to an inadequate volume of blood received in culture bottles Performed at [facility] [address], [place], [place] [address]    Culture   Final    NO GROWTH 5 DAYS Performed at [facility], [place] [address], [place], [place] [address]    Report Status 03/25/2020 FINAL  Final  Urine culture     Status: Abnormal   Collection Time: 03/19/20 11:59 PM   Specimen: Urine, Random  Result Value Ref Range Status   Specimen Description   Final    URINE, RANDOM Performed at [facility] [address] [place], [place] [address]    Special Requests   Final    NONE Performed at [facility], [place] [address], [place], [place] [address]    Culture (A)  Final    <10,000 COLONIES/mL INSIGNIFICANT GROWTH Performed at [facility] [address] [place],  [address]    Report Status 03/21/2020 FINAL  Final  Respiratory Panel by RT PCR (Flu A&B, Covid) - Nasopharyngeal Swab     Status: None   Collection Time: 03/20/20  4:24 AM   Specimen: Nasopharyngeal Swab  Result Value Ref Range Status   SARS Coronavirus 2 by RT PCR NEGATIVE NEGATIVE Final    Comment: (NOTE) SARS-CoV-2 target nucleic acids are NOT DETECTED.  The SARS-CoV-2  RNA is generally detectable in upper respiratoy specimens during the acute phase of infection. The lowest concentration of SARS-CoV-2 viral copies this assay can detect is 131 copies/mL. A negative result does not preclude SARS-Cov-2 infection and should not be used as the sole basis for treatment or other patient management decisions. A negative result may occur with  improper specimen collection/handling, submission of specimen other than nasopharyngeal swab, presence of viral mutation(s) within the areas  targeted by this assay, and inadequate number of viral copies (<131 copies/mL). A negative result must be combined with clinical observations, patient history, and epidemiological information. The expected result is Negative.  Fact Sheet for Patients:  PinkCheek.be  Fact Sheet for Healthcare Providers:  GravelBags.it  This test is no t yet approved or cleared by the [place] FDA and  has been authorized for detection and/or diagnosis of SARS-CoV-2 by FDA under an Emergency Use Authorization (EUA). This EUA will remain  in effect (meaning this test can be used) for the duration of the COVID-19 declaration under Section 564(b)(1) of the Act, 21 U.S.C. section 360bbb-3(b)(1), unless the authorization is terminated or revoked sooner.     Influenza A by PCR NEGATIVE NEGATIVE Final   Influenza B by PCR NEGATIVE NEGATIVE Final    Comment: (NOTE) The Xpert Xpress SARS-CoV-2/FLU/RSV assay is intended as an aid in  the diagnosis of influenza from Nasopharyngeal swab specimens and  should not be used as a sole basis for treatment. Nasal washings and  aspirates are unacceptable for Xpert Xpress SARS-CoV-2/FLU/RSV  testing.  Fact Sheet for Patients: PinkCheek.be  Fact Sheet for Healthcare Providers: GravelBags.it  This test is not yet approved or cleared by the [place] FDA and  has been authorized for detection and/or diagnosis of SARS-CoV-2 by  FDA under an Emergency Use Authorization (EUA). This EUA will remain  in effect (meaning this test can be used) for the duration of the  Covid-19 declaration under Section 564(b)(1) of the Act, 21  U.S.C. section 360bbb-3(b)(1), unless the authorization is  terminated or revoked. Performed at [facility], [place] [address] [place], [place] [address]   Surgical pcr screen     Status: Abnormal    Collection Time: 03/23/20  8:09 PM   Specimen: Nasal Mucosa; Nasal Swab  Result Value Ref Range Status   MRSA, PCR POSITIVE (A) NEGATIVE Final    Comment: RESULT CALLED TO, READ BACK BY AND VERIFIED WITH: V SNEIDERMAN RN 03/23/20 AT 2238 SK    Staphylococcus aureus POSITIVE (A) NEGATIVE Final    Comment: RESULT CALLED TO, READ BACK BY AND VERIFIED WITH: Andres Labrum RN 03/23/20 AT 2238 SK (NOTE) The Xpert SA Assay (FDA approved for NASAL specimens in patients 5 years of age and older), is one component of a comprehensive surveillance program. It is not intended to diagnose infection nor to guide or monitor treatment. Performed at [facility], [place] [address], [place], [place] [address]   SARS Coronavirus 2 by RT PCR (hospital order, performed in [facility] hospital lab) Nasopharyngeal Nasopharyngeal Swab     Status: None   Collection Time: 03/27/20  4:54 AM   Specimen: Nasopharyngeal Swab  Result Value Ref Range Status   SARS Coronavirus 2 NEGATIVE NEGATIVE Final    Comment: (NOTE) SARS-CoV-2 target nucleic acids are NOT DETECTED.  The SARS-CoV-2 RNA is generally detectable in upper and lower respiratory specimens during the acute phase of infection. The lowest concentration of SARS-CoV-2 viral copies this assay can  detect is 250 copies / mL. A negative result does not preclude SARS-CoV-2 infection and should not be used as the sole basis for treatment or other patient management decisions.  A negative result may occur with improper specimen collection / handling, submission of specimen other than nasopharyngeal swab, presence of viral mutation(s) within the areas targeted by this assay, and inadequate number of viral copies (<250 copies / mL). A negative result must be combined with clinical observations, patient history, and epidemiological information.  Fact Sheet for Patients:   StrictlyIdeas.no  Fact Sheet for Healthcare  Providers: BankingDealers.co.za  This test is not yet approved or  cleared by the [place] FDA and has been authorized for detection and/or diagnosis of SARS-CoV-2 by FDA under an Emergency Use Authorization (EUA).  This EUA will remain in effect (meaning this test can be used) for the duration of the COVID-19 declaration under Section 564(b)(1) of the Act, 21 U.S.C. section 360bbb-3(b)(1), unless the authorization is terminated or revoked sooner.  Performed at [facility], [place] [address] [place], [place] [address]      Time coordinating discharge: 35 minutes  SIGNED:   Aline [place], MD  [facility] 03/28/2020, 8:50 AM

## 2020-03-28 NOTE — TOC Transition Note (Addendum)
Transition of Care [facility]) - CM/SW Discharge Note   Patient Details  Name: [name] MRN: [id number] Date of Birth: [birth year]  [facility] CM/SW Contact:  Curlene Labrum, RN Phone Number: 03/28/2020, 10:45 AM   Clinical Narrative:    Case management spoke with [name], CM at [facility] and the patient will be admitted to the facility today for room 3213.  The primary nurse on [place] will call report to [facility] at [phone].  The patient's clinicals were placed in the hub for the facility. Patient was approved by insurance for SNP placement - ref #[id number] - approval number [id number].  I called and spoke with the niece, [name] and notified her that the patient will transfer to the facility today.  She will meet the patient at [facility] to sign him in.  Will continue to follow the patient for transfer and PTAR to be arranged.  03/28/2020 1130 - PTAR called and transportation was arranged for 1 pm today to transfer to the facility.  I spoke with the patient at the bedside and he is aware of his transfer and discharge today to [facility].    Final next level of care: [place] Barriers to Discharge: Continued Medical Work up   Patient Goals and CMS Choice Patient states their goals for this hospitalization and ongoing recovery are:: "get back to 100%" CMS Medicare.gov Compare Post Acute Care list provided to:: Patient Choice offered to / list presented to : Patient  Discharge Placement                       Discharge Plan and Services   Discharge Planning Services: CM Consult Post Acute Care Choice: Home Health                               Social Determinants of Health (SDOH) Interventions     Readmission Risk Interventions Readmission Risk Prevention Plan 11/01/2018  Transportation Screening Complete  PCP or Specialist Appt within 5-7 Days Complete  Home Care Screening Complete  Medication Review (RN  CM) Complete  Some recent data might be hidden

## 2020-03-31 ENCOUNTER — Telehealth: Payer: Self-pay

## 2020-03-31 NOTE — Telephone Encounter (Signed)
Butch Penny called in wanting about patient . Per last note  [phone]

## 2020-03-31 NOTE — Telephone Encounter (Signed)
Called and lm on vm for Butch Penny to advise that the pt has an appt sch on Wednesday at 10am this was sch with his niece. If he has on the surgical dressing that as long as this has not become soiled that he will leave this intact until the first post op appt. If it does become soiled then this can be removed and a dry dressing applied everyday until his post op and will give update wound care orders after that. To call with any questions. Advised that the pt's niece had called and was very concerned that the she was advised that the pt did not have any d/c orders and had been in the facility since Tuesday with no contact from our office.I advised that if there was anything else that the pt needed to call and let us know

## 2020-03-31 NOTE — Telephone Encounter (Signed)
I called and advised that I had left a message with the nurse at [facility] about dressing and post op appt.and that if there are any other questions or concerns to call our office

## 2020-03-31 NOTE — Telephone Encounter (Signed)
[name] called in saying discharge notes doesn't say anything wants to know about what to do as far as treating patient . Says patient also needs a follow up appt

## 2020-04-05 ENCOUNTER — Telehealth: Payer: Self-pay

## 2020-04-05 ENCOUNTER — Ambulatory Visit (INDEPENDENT_AMBULATORY_CARE_PROVIDER_SITE_OTHER): Payer: Medicare Other | Admitting: Physician Assistant

## 2020-04-05 DIAGNOSIS — M86171 Other acute osteomyelitis, right ankle and foot: Secondary | ICD-10-CM

## 2020-04-05 NOTE — Progress Notes (Signed)
Office Visit Note   Patient: [name]           Date of Birth: May 08, 1941           MRN: [id number] Visit Date: 04/05/2020              Requested by: [name], MD [address] [place],  [place] [address] PCP: [name], MD  No chief complaint on file.     HPI: Patient presents 2 weeks status post excision of medial cuneiform.  He is at a nursing facility and doing very well.  He is accompanied by his niece  Assessment & Plan: Visit Diagnoses: No diagnosis found.  Plan: Sutures are removed in 1 week.  He will need a prescription for an orthotic filler he does not have an orthotic at this time.  May begin daily cleansing with antibacterial soap daily dry dressing change and this was related to his nursing facility  Follow-Up Instructions: No follow-ups on file.   Ortho Exam  Patient is alert, oriented, no adenopathy, well-dressed, normal affect, normal respiratory effort. Foot well apposed wound edges sutures in place no dehiscence no erythema no cellulitis no drainage.  Wound is in excellent condition  Imaging: No results found. No images are attached to the encounter.  Labs: Lab Results  Component Value Date   HGBA1C 5.7 (H) 03/21/2020   HGBA1C 5.4 11/01/2018   HGBA1C 5.2 05/29/2018   ESRSEDRATE 50 (H) 03/20/2020   ESRSEDRATE 50 (H) 03/17/2020   ESRSEDRATE 34 (H) 12/17/2017   CRP 13.5 (H) 03/25/2020   CRP 0.8 03/17/2020   CRP 17.1 (H) 12/17/2017   REPTSTATUS 03/25/2020 FINAL 03/19/2020   REPTSTATUS 03/25/2020 FINAL 03/19/2020   REPTSTATUS 03/21/2020 FINAL 03/19/2020   GRAMSTAIN  03/17/2020    FEW WBC PRESENT, PREDOMINANTLY PMN FEW GRAM POSITIVE COCCI    CULT  03/19/2020    NO GROWTH 5 DAYS Performed at [facility], [place] [address], [place], [place] [address]    CULT  03/19/2020    NO GROWTH 5 DAYS Performed at [facility] [address], [place], [place] [address]    CULT (A) 03/19/2020    <10,000  COLONIES/mL INSIGNIFICANT GROWTH Performed at [facility] [address], [place], [place] [address]    LABORGA METHICILLIN RESISTANT STAPHYLOCOCCUS AUREUS 03/17/2020     Lab Results  Component Value Date   ALBUMIN 3.5 03/19/2020   ALBUMIN 3.7 02/05/2020   ALBUMIN 3.2 (L) 10/31/2018    Lab Results  Component Value Date   MG 1.9 03/27/2020   MG 1.6 (L) 03/26/2020   MG 1.4 (L) 03/25/2020   No results found for: VD25OH  No results found for: PREALBUMIN CBC EXTENDED Latest Ref Rng & Units 03/27/2020 03/26/2020 03/25/2020  WBC 4.0 - 10.5 K/uL 8.9 8.5 8.2  RBC 4.22 - 5.81 MIL/uL 3.36(L) 3.57(L) 2.99(L)  HGB 13.0 - 17.0 g/dL 9.6(L) 10.1(L) 8.3(L)  HCT 39 - 52 % 29.5(L) 31.2(L) 26.0(L)  PLT 150 - 400 K/uL 280 218 203  NEUTROABS 1.7 - 7.7 K/uL 5.4 5.3 5.3  LYMPHSABS 0.7 - 4.0 K/uL 1.4 1.2 1.2     There is no height or weight on file to calculate BMI.  Orders:  No orders of the defined types were placed in this encounter.  No orders of the defined types were placed in this encounter.    Procedures: No procedures performed  Clinical Data: No additional findings.  ROS:  All other systems negative, except as noted in the HPI. Review of Systems  Objective: Vital Signs: There were no vitals taken for this visit.  Specialty Comments:  No specialty comments available.  PMFS History: Patient Active Problem List   Diagnosis Date Noted  . Acute encephalopathy 03/20/2020  . SIRS (systemic inflammatory response syndrome) ([place]) 03/20/2020  . 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[place]) 03/20/2020  . ARF (acute renal failure) ([place]) 03/20/2020  . Midfoot ulcer, left, limited to breakdown of skin ([place]) 12/29/2018  . Occipital stroke ([place]) 10/31/2018  . DM2 (diabetes mellitus, type 2) ([place]) 10/31/2018  . NSTEMI (non-ST elevated myocardial infarction) ([place]) 05/28/2018  . Syncope 05/28/2018  . HTN (hypertension) 05/28/2018  . Hyperlipemia, mixed 05/28/2018  . Rash and  nonspecific skin eruption 12/16/2017  . Pain in right hand 09/11/2017  . Carpal tunnel syndrome, right upper limb 07/01/2017  . Arthritis of carpometacarpal [facility]) joint of left thumb 12/30/2016  . Midfoot skin ulcer, right, limited to breakdown of skin ([place]) 07/22/2016  . 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 Osteomyelitis of ankle or foot, left, acute ([place]) 08/23/2014  . Cellulitis 10/12/2012  . Chest pain 10/12/2012  . Tobacco abuse 10/12/2012  . ABSCESS, AXILLA, LEFT 02/23/2008  . 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 CHEST WALL PAIN, ACUTE 12/03/2007  . CANDIDIASIS, GLANS PENIS 09/16/2007  . HEADACHE 08/04/2007  . 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 Other and unspecified hyperlipidemia 03/05/2007  . GOUT 03/05/2007  . ERECTILE DYSFUNCTION 03/05/2007  . EXTERNAL HEMORRHOIDS 03/05/2007  . VENTRAL HERNIA 03/05/2007  . FATTY LIVER DISEASE 03/05/2007  . Osteoarthritis 03/05/2007  . HEMORRHOIDS, INTERNAL 03/17/2005  . COLONIC POLYPS, HX OF 03/17/2005   Past Medical History:  Diagnosis Date  . Arthritis   . Diabetic neuropathy ([place])   . High cholesterol   . Hypertension    denies  . Osteomyelitis (HCC)    Right great toe  . Peripheral vascular disease ([place])    diabetic  with osteomylitis  . Stroke (cerebrum) ([place]) 10/2018  . Type II diabetes mellitus (HCC)     Family History  Problem Relation Age of Onset  . Diabetes Mellitus II Other   . Anesthesia problems Neg Hx     Past Surgical History:  Procedure Laterality Date  . AMPUTATION  07/23/2011   Procedure: AMPUTATION DIGIT;  Surgeon: Newt Minion, MD;  Location: [place];  Service: Orthopedics;  Laterality: Right;  Right Great Toe Amputation  . AMPUTATION Right 09/18/2012   Procedure: Right Foot 2nd Ray Amputation;  Surgeon: Newt Minion, MD;  Location: [place];  Service: Orthopedics;  Laterality: Right;  Right Foot 2nd  Ray Amputation  . AMPUTATION Right 10/14/2012   Procedure: AMPUTATION FOOT;  Surgeon: Newt Minion, MD;  Location: [facility];  Service: Orthopedics;  Laterality: Right;  Right Foot Transmetatarsal Amputation  . AMPUTATION Left 09/23/2014   Procedure: Left Foot Transmetatarsal Amputation;  Surgeon: Newt Minion, MD;  Location: [facility];  Service: Orthopedics;  Laterality: Left;  . BACK SURGERY     lower  . CARPAL TUNNEL RELEASE Right 01/30/2018   Procedure: RIGHT CARPAL TUNNEL RELEASE;  Surgeon: Newt Minion, MD;  Location: [place];  Service: Orthopedics;  Laterality: Right;  . CIRCUMCISION  2010  . COLONOSCOPY    . FOOT FRACTURE SURGERY Left 1970's   "broke it playing football" (10/12/2012)  . HUMERUS FRACTURE SURGERY W/  IMPLANT Right 1960's   "put a plate in it" (2/99/8069)  . I & D EXTREMITY Right 03/24/2020   Procedure: EXCISION RIGHT MEDIAL CUNEFORM;  Surgeon: Newt Minion, MD;  Location: [facility];  Service: Orthopedics;  Laterality: Right;  . INCISION AND DRAINAGE OF WOUND Left 2006   "foot" (10/12/2012)  . LUMBAR [place] SURGERY  2008   Social History   Occupational History  . Not on file  Tobacco Use  . Smoking status: Current Every Day Smoker    Years: 12.00    Types: Pipe  . Smokeless tobacco: Never Used  Vaping Use  . Vaping Use: Never used  Substance and Sexual Activity  . Alcohol use: No    Alcohol/week: 0.0 standard drinks  . Drug use: No  . Sexual activity: Not Currently

## 2020-04-05 NOTE — Telephone Encounter (Signed)
Please see below.

## 2020-04-05 NOTE — Telephone Encounter (Signed)
[name] patients niece called she is requesting motive Information about patients right and left foot she wants to know what's the next step as far as care. Call [phone]

## 2020-04-05 NOTE — Telephone Encounter (Signed)
I spoke with the patient's niece [name].  It was his other niece Butch Penny that was with him at his appointment today.  I went over how he was doing on the right foot.  Apparently he has been seeing the wound center for his transmetatarsal amputation site on the left.  When he was here today they alluded to this and I asked them if they wanted me to take a look and they said no.  After speaking with [name] we both agreed since Dr. Sharol Given did a transmetatarsal amputation on the left in 2016 if there were any wound issues it is probably better if they are managed by [place].  We will look at both feet at his next appointment

## 2020-04-12 ENCOUNTER — Ambulatory Visit: Payer: Medicare Other | Admitting: Family

## 2020-04-12 ENCOUNTER — Ambulatory Visit (INDEPENDENT_AMBULATORY_CARE_PROVIDER_SITE_OTHER): Payer: Medicare Other | Admitting: Family

## 2020-04-12 ENCOUNTER — Encounter: Payer: Self-pay | Admitting: Family

## 2020-04-12 DIAGNOSIS — Z89421 Acquired absence of other right toe(s): Secondary | ICD-10-CM

## 2020-04-12 NOTE — Progress Notes (Signed)
Post-Op Visit Note   Patient: [name]           Date of Birth: 11-18-1940           MRN: [id number] Visit Date: 04/12/2020 PCP: [name], MD  Chief Complaint: No chief complaint on file.   HPI:  HPI The patient is a 79 year old gentleman seen today status post right foot excision medial cuneiform.  He is status post remote bilateral transmetatarsal amputations.  He is currently residing at [facility] for his rehabilitation plans to discharge on Saturday.  Family concerned for generalized weakness feel that they will require a lift chair.  Currently ambulating in postop shoes bilaterally Ortho Exam On examination of the right foot the incision is well-healed sutures are in place these were harvested today without incident there is no bleeding drainage edema no sign of infection.  He does have a Wagner grade 1 ulcer to the plantar aspect of his foot this was debrided with a 10 blade knife back to viable tissue.  There is no underlying open area or drainage.  On examination of the left foot he does have 2 areas of significant callus buildup on the plantar aspect the distal calluses 2 cm in diameter this was debrided with a 10 blade knife there is no underlying ulcer open area no drainage the proximal callus which is the size of a quarter was also debrided with a 10 blade knife there is no underlying open area No drainage no erythema no sign of infection  Visit Diagnoses:  1. 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  He may advance his weightbearing as tolerated to have recommended extra-depth shoes with custom orthotics may weight-bear as tolerated with therapy.  Follow-Up Instructions: No follow-ups on file.   Imaging: No results found.  Orders:  No orders of the defined types were placed in this encounter.  No orders of the defined types were placed in this encounter.    PMFS  History: Patient Active Problem List   Diagnosis Date Noted  . 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 Acute encephalopathy 03/20/2020  . SIRS (systemic inflammatory response syndrome) ([place]) 03/20/2020  . 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[place]) 03/20/2020  . ARF (acute renal failure) ([place]) 03/20/2020  . Midfoot ulcer, left, limited to breakdown of skin ([place]) 12/29/2018  . Occipital stroke ([place]) 10/31/2018  . DM2 (diabetes mellitus, type 2) ([place]) 10/31/2018  . NSTEMI (non-ST elevated myocardial infarction) ([place]) 05/28/2018  . Syncope 05/28/2018  . HTN (hypertension) 05/28/2018  . Hyperlipemia, mixed 05/28/2018  . Rash and nonspecific skin eruption 12/16/2017  . Pain in right hand 09/11/2017  . Carpal tunnel syndrome, right upper limb 07/01/2017  . Arthritis of carpometacarpal [facility]) joint of left thumb 12/30/2016  . Midfoot skin ulcer, right, limited to breakdown of skin ([place]) 07/22/2016  . 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 Osteomyelitis of ankle or foot, left, acute ([place]) 08/23/2014  . Cellulitis 10/12/2012  . Chest pain 10/12/2012  . Tobacco abuse 10/12/2012  . ABSCESS, AXILLA, LEFT 02/23/2008  . 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 CHEST WALL PAIN, ACUTE 12/03/2007  . CANDIDIASIS, GLANS PENIS 09/16/2007  . HEADACHE 08/04/2007  . 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 Other and unspecified hyperlipidemia 03/05/2007  . GOUT 03/05/2007  .  ERECTILE DYSFUNCTION 03/05/2007  . EXTERNAL HEMORRHOIDS 03/05/2007  . VENTRAL HERNIA 03/05/2007  . FATTY LIVER DISEASE 03/05/2007  . Osteoarthritis 03/05/2007  . HEMORRHOIDS, INTERNAL 03/17/2005  . COLONIC POLYPS, HX OF 03/17/2005   Past Medical History:  Diagnosis Date  . Arthritis   . Diabetic neuropathy ([place])   . High cholesterol   . Hypertension    denies  . Osteomyelitis (HCC)    Right great toe  .  Peripheral vascular disease ([place])    diabetic  with osteomylitis  . Stroke (cerebrum) ([place]) 10/2018  . Type II diabetes mellitus (HCC)     Family History  Problem Relation Age of Onset  . Diabetes Mellitus II Other   . Anesthesia problems Neg Hx     Past Surgical History:  Procedure Laterality Date  . AMPUTATION  07/23/2011   Procedure: AMPUTATION DIGIT;  Surgeon: Newt Minion, MD;  Location: [place];  Service: Orthopedics;  Laterality: Right;  Right Great Toe Amputation  . AMPUTATION Right 09/18/2012   Procedure: Right Foot 2nd Ray Amputation;  Surgeon: Newt Minion, MD;  Location: [facility];  Service: Orthopedics;  Laterality: Right;  Right Foot 2nd Ray Amputation  . AMPUTATION Right 10/14/2012   Procedure: AMPUTATION FOOT;  Surgeon: Newt Minion, MD;  Location: [place];  Service: Orthopedics;  Laterality: Right;  Right Foot Transmetatarsal Amputation  . AMPUTATION Left 09/23/2014   Procedure: Left Foot Transmetatarsal Amputation;  Surgeon: Newt Minion, MD;  Location: [place];  Service: Orthopedics;  Laterality: Left;  . BACK SURGERY     lower  . CARPAL TUNNEL RELEASE Right 01/30/2018   Procedure: RIGHT CARPAL TUNNEL RELEASE;  Surgeon: Newt Minion, MD;  Location: [facility];  Service: Orthopedics;  Laterality: Right;  . CIRCUMCISION  2010  . COLONOSCOPY    . FOOT FRACTURE SURGERY Left 1970's   "broke it playing football" (10/12/2012)  . HUMERUS FRACTURE SURGERY W/ IMPLANT Right 1960's   "put a plate in it" (9/37/1696)  . I & D EXTREMITY Right 03/24/2020   Procedure: EXCISION RIGHT MEDIAL CUNEFORM;  Surgeon: Newt Minion, MD;  Location: [facility];  Service: Orthopedics;  Laterality: Right;  . INCISION AND DRAINAGE OF WOUND Left 2006   "foot" (10/12/2012)  . LUMBAR [place] SURGERY  2008   Social History   Occupational History  . Not on file  Tobacco Use  . Smoking status: Current Every Day Smoker    Years: 12.00    Types: Pipe  . Smokeless tobacco: Never Used  Vaping Use  . Vaping Use: Never  used  Substance and Sexual Activity  . Alcohol use: No    Alcohol/week: 0.0 standard drinks  . Drug use: No  . Sexual activity: Not Currently

## 2020-04-18 ENCOUNTER — Telehealth: Payer: Self-pay | Admitting: Orthopedic Surgery

## 2020-04-18 ENCOUNTER — Ambulatory Visit: Payer: Medicare Other | Admitting: Family

## 2020-04-18 NOTE — Telephone Encounter (Signed)
Patient called asking for a return call from [facility] 1 pm about special shoe patient is currently wearing. Patient wants to know is it ok for him to wear his regular shoe. Please call patient at [phone]. No voicemail set up so call after 1 pm please.

## 2020-04-19 NOTE — Telephone Encounter (Signed)
Called pt and advised to use his post op shoe until his extra depth shoes and orthotics are ready.

## 2020-05-10 ENCOUNTER — Encounter: Payer: Self-pay | Admitting: Family

## 2020-05-10 ENCOUNTER — Telehealth: Payer: Self-pay | Admitting: Orthopedic Surgery

## 2020-05-10 ENCOUNTER — Ambulatory Visit (INDEPENDENT_AMBULATORY_CARE_PROVIDER_SITE_OTHER): Payer: Medicare Other | Admitting: Family

## 2020-05-10 DIAGNOSIS — L97421 Non-pressure chronic ulcer of left heel and midfoot limited to breakdown of skin: Secondary | ICD-10-CM

## 2020-05-10 DIAGNOSIS — Z89432 Acquired absence of left foot: Secondary | ICD-10-CM | POA: Diagnosis not present

## 2020-05-10 DIAGNOSIS — L97411 Non-pressure chronic ulcer of right heel and midfoot limited to breakdown of skin: Secondary | ICD-10-CM | POA: Diagnosis not present

## 2020-05-10 DIAGNOSIS — Z89431 Acquired absence of right foot: Secondary | ICD-10-CM

## 2020-05-10 MED ORDER — PREGABALIN 50 MG PO CAPS
50.0000 mg | ORAL_CAPSULE | Freq: Two times a day (BID) | ORAL | Status: DC
Start: 2020-05-10 — End: 2020-05-26

## 2020-05-10 NOTE — Telephone Encounter (Signed)
Samantha with [facility] would like to get an updated rx for the pts gabapentin and lyrica faxed over please  Fax# [phone]

## 2020-05-10 NOTE — Telephone Encounter (Signed)
Will have Junie Panning address while pt in office today.

## 2020-05-10 NOTE — Progress Notes (Signed)
Post-Op Visit Note   Patient: [name]           Date of Birth: June 04, 1941           MRN: [id number] Visit Date: 05/10/2020 PCP: [name], MD  Chief Complaint:  Chief Complaint  Patient presents with  . Right Foot - Routine Post Op    03/24/20 excision right medial cuneiform     HPI:  HPI The patient is a 79 year old gentleman seen today status post excision right medial cuneiform and show part amputation of the right foot.  He is status post remote transmetatarsal amputation on the left he is residing at home his niece is doing his wound care she has been dressing his wounds daily with gauze dressings and Vaseline.  She states that they are in the process of getting new inserts for his shoes  Ortho Exam On examination of the right foot the incision is well-healed there is no open ulceration he has a very superficial callus to the plantar aspect of his foot Wagner grade 1 ulcer this was debrided easily with gauze.  The left foot transmetatarsal amputation is well-healed he has 2 Wagner grade 1 ulcers to the foot these were debrided with a 10 blade knife back to viable tissue there is no bleeding no open ulceration no surrounding erythema no sign of infection  Visit Diagnoses:  1. Status post transmetatarsal amputation of left foot ([place])   2. Midfoot skin ulcer, right, limited to breakdown of skin ([place])   3. History of [place] amputation of right foot ([place])     Plan: She will continue closely monitoring his feet.  He will follow-up in the office in 6 weeks for bilateral foot check proceed with custom orthotics  Follow-Up Instructions: Return in about 6 weeks (around 06/21/2020).   Imaging: No results found.  Orders:  No orders of the defined types were placed in this encounter.  Meds ordered this encounter  Medications  . pregabalin (LYRICA) 50 MG capsule    Sig: Take 1 capsule (50 mg total) by mouth 2 (two) times daily.    This prescription was filled on  02/08/2020. Any refills authorized will be placed on file.     PMFS History: Patient Active Problem List   Diagnosis Date Noted  . 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 Acute encephalopathy 03/20/2020  . SIRS (systemic inflammatory response syndrome) ([place]) 03/20/2020  . 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[place]) 03/20/2020  . ARF (acute renal failure) ([place]) 03/20/2020  . Midfoot ulcer, left, limited to breakdown of skin ([place]) 12/29/2018  . Occipital stroke ([place]) 10/31/2018  . DM2 (diabetes mellitus, type 2) ([place]) 10/31/2018  . NSTEMI (non-ST elevated myocardial infarction) ([place]) 05/28/2018  . Syncope 05/28/2018  . HTN (hypertension) 05/28/2018  . Hyperlipemia, mixed 05/28/2018  . Rash and nonspecific skin eruption 12/16/2017  . Pain in right hand 09/11/2017  . Carpal tunnel syndrome, right upper limb 07/01/2017  . Arthritis of carpometacarpal [facility]) joint of left thumb 12/30/2016  . Midfoot skin ulcer, right, limited to breakdown of skin ([place]) 07/22/2016  . 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[place]) 09/23/2014  . Osteomyelitis of ankle or foot, left, acute ([place]) 08/23/2014  . Cellulitis 10/12/2012  . Chest pain 10/12/2012  . Tobacco abuse 10/12/2012  . ABSCESS, AXILLA, LEFT 02/23/2008  . 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 CHEST WALL PAIN, ACUTE 12/03/2007  . CANDIDIASIS, GLANS PENIS 09/16/2007  . HEADACHE 08/04/2007  . CHERRY ANGIOMA  03/05/2007  . 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 Other and unspecified hyperlipidemia 03/05/2007  . GOUT 03/05/2007  . ERECTILE DYSFUNCTION 03/05/2007  . EXTERNAL HEMORRHOIDS 03/05/2007  . VENTRAL HERNIA 03/05/2007  . FATTY LIVER DISEASE 03/05/2007  . Osteoarthritis 03/05/2007  . HEMORRHOIDS, INTERNAL 03/17/2005  . COLONIC POLYPS, HX OF 03/17/2005   Past Medical History:  Diagnosis Date  . Arthritis   . Diabetic neuropathy ([place])   . High cholesterol   .  Hypertension    denies  . Osteomyelitis (HCC)    Right great toe  . Peripheral vascular disease ([place])    diabetic  with osteomylitis  . Stroke (cerebrum) ([place]) 10/2018  . Type II diabetes mellitus (HCC)     Family History  Problem Relation Age of Onset  . Diabetes Mellitus II Other   . Anesthesia problems Neg Hx     Past Surgical History:  Procedure Laterality Date  . AMPUTATION  07/23/2011   Procedure: AMPUTATION DIGIT;  Surgeon: Newt Minion, MD;  Location: [place];  Service: Orthopedics;  Laterality: Right;  Right Great Toe Amputation  . AMPUTATION Right 09/18/2012   Procedure: Right Foot 2nd Ray Amputation;  Surgeon: Newt Minion, MD;  Location: [facility] [place];  Service: Orthopedics;  Laterality: Right;  Right Foot 2nd Ray Amputation  . AMPUTATION Right 10/14/2012   Procedure: AMPUTATION FOOT;  Surgeon: Newt Minion, MD;  Location: [place];  Service: Orthopedics;  Laterality: Right;  Right Foot Transmetatarsal Amputation  . AMPUTATION Left 09/23/2014   Procedure: Left Foot Transmetatarsal Amputation;  Surgeon: Newt Minion, MD;  Location: [facility];  Service: Orthopedics;  Laterality: Left;  . BACK SURGERY     lower  . CARPAL TUNNEL RELEASE Right 01/30/2018   Procedure: RIGHT CARPAL TUNNEL RELEASE;  Surgeon: Newt Minion, MD;  Location: [place];  Service: Orthopedics;  Laterality: Right;  . CIRCUMCISION  2010  . COLONOSCOPY    . FOOT FRACTURE SURGERY Left 1970's   "broke it playing football" (10/12/2012)  . HUMERUS FRACTURE SURGERY W/ IMPLANT Right 1960's   "put a plate in it" (3/35/4562)  . I & D EXTREMITY Right 03/24/2020   Procedure: EXCISION RIGHT MEDIAL CUNEFORM;  Surgeon: Newt Minion, MD;  Location: [place];  Service: Orthopedics;  Laterality: Right;  . INCISION AND DRAINAGE OF WOUND Left 2006   "foot" (10/12/2012)  . LUMBAR [place] SURGERY  2008   Social History   Occupational History  . Not on file  Tobacco Use  . Smoking status: Current Every Day Smoker    Years: 12.00    Types:  Pipe  . Smokeless tobacco: Never Used  Vaping Use  . Vaping Use: Never used  Substance and Sexual Activity  . Alcohol use: No    Alcohol/week: 0.0 standard drinks  . Drug use: No  . Sexual activity: Not Currently

## 2020-05-15 ENCOUNTER — Telehealth: Payer: Self-pay

## 2020-05-15 NOTE — Telephone Encounter (Signed)
Lawson Radar at [facility] called stating that patient would like a Rx refill on Lyrica and needs a new Rx for Gabapentin sent to [facility].  CB# [phone].  Please advise.  Thank you.

## 2020-05-16 NOTE — Telephone Encounter (Signed)
I called and lm on vm to advise of the message below. To call with any questions.

## 2020-05-16 NOTE — Telephone Encounter (Signed)
Refilled the lyrica last week. Cannot refill the gabapentin, should not be using both at same time, there is drug interaction between

## 2020-05-23 ENCOUNTER — Telehealth: Payer: Self-pay

## 2020-05-23 NOTE — Telephone Encounter (Signed)
patient called he is requesting a rx to be sent so he can get a lift chair. [phone]

## 2020-05-24 ENCOUNTER — Telehealth: Payer: Self-pay | Admitting: Physician Assistant

## 2020-05-24 NOTE — Telephone Encounter (Signed)
Patient called advised the Gabapentin 300mg  is causing him to have burning in his  knees and feet. Patient said he can not sleep at night. Patient said he will not take any more of the medication until he hears from someone. The number to contact patient is [phone]

## 2020-05-24 NOTE — Telephone Encounter (Signed)
I called pt and he said that he was taking gabapentin and advised that he should stop this and take lyrica only. He should not take them both together he needs to take the lyrica that was given at his visit on 04/2020 only. Pt voiced understanding and will call with any other questions.

## 2020-05-25 ENCOUNTER — Telehealth: Payer: Self-pay | Admitting: Orthopedic Surgery

## 2020-05-25 NOTE — Telephone Encounter (Signed)
Patient called needing to Rx filled Lyrica. The number to contact patient is [phone]

## 2020-05-25 NOTE — Telephone Encounter (Signed)
Pt is requesting refill on lyrica please advise.

## 2020-05-26 MED ORDER — PREGABALIN 50 MG PO CAPS
50.0000 mg | ORAL_CAPSULE | Freq: Two times a day (BID) | ORAL | Status: DC
Start: 2020-05-26 — End: 2020-06-14

## 2020-06-07 NOTE — Telephone Encounter (Signed)
Will discuss at office visit. Not able to order this through [facility] not able to sent rx unless there is a company that pt is going to work with for mechanism or to but the chair out right.

## 2020-06-13 ENCOUNTER — Telehealth: Payer: Self-pay | Admitting: Physician Assistant

## 2020-06-13 NOTE — Telephone Encounter (Signed)
Pt called and would like a rx for gabapentin sent in please

## 2020-06-14 ENCOUNTER — Other Ambulatory Visit: Payer: Self-pay | Admitting: Physician Assistant

## 2020-06-14 MED ORDER — PREGABALIN 50 MG PO CAPS
50.0000 mg | ORAL_CAPSULE | Freq: Two times a day (BID) | ORAL | Status: DC
Start: 2020-06-14 — End: 2020-06-14

## 2020-06-14 MED ORDER — PREGABALIN 50 MG PO CAPS
50.0000 mg | ORAL_CAPSULE | Freq: Two times a day (BID) | ORAL | 2 refills | Status: DC
Start: 2020-06-14 — End: 2020-08-08

## 2020-06-14 NOTE — Telephone Encounter (Signed)
done

## 2020-06-14 NOTE — Telephone Encounter (Signed)
Can you res[facility] for Ly[facility]. The rx that was sent on 05/26/20 went ot [facility] and pt does not use this anymore. Had advised per our last conversation that he is to take Lyrica only no longer give rx for gabapentin. It did not work for the pt and NP changed this and advised he can not take them both together. Pt voiced understanding and will call if rx has not been received by [facility].

## 2020-06-28 ENCOUNTER — Ambulatory Visit: Payer: Medicare Other | Admitting: Family

## 2020-06-28 ENCOUNTER — Ambulatory Visit: Payer: Medicare Other | Admitting: Physician Assistant

## 2020-06-28 DIAGNOSIS — S98922D Partial traumatic amputation of left foot, level unspecified, subsequent encounter: Secondary | ICD-10-CM | POA: Diagnosis not present

## 2020-06-28 DIAGNOSIS — I639 Cerebral infarction, unspecified: Secondary | ICD-10-CM | POA: Diagnosis not present

## 2020-06-28 DIAGNOSIS — I2101 ST elevation (STEMI) myocardial infarction involving left main coronary artery: Secondary | ICD-10-CM | POA: Diagnosis not present

## 2020-06-28 DIAGNOSIS — E1151 Type 2 diabetes mellitus with diabetic peripheral angiopathy without gangrene: Secondary | ICD-10-CM | POA: Diagnosis not present

## 2020-07-10 ENCOUNTER — Encounter: Payer: Self-pay | Admitting: Orthopedic Surgery

## 2020-07-10 ENCOUNTER — Ambulatory Visit (INDEPENDENT_AMBULATORY_CARE_PROVIDER_SITE_OTHER): Payer: Medicare Other | Admitting: Orthopedic Surgery

## 2020-07-10 DIAGNOSIS — Z961 Presence of intraocular lens: Secondary | ICD-10-CM | POA: Diagnosis not present

## 2020-07-10 DIAGNOSIS — H04123 Dry eye syndrome of bilateral lacrimal glands: Secondary | ICD-10-CM | POA: Diagnosis not present

## 2020-07-10 DIAGNOSIS — L97421 Non-pressure chronic ulcer of left heel and midfoot limited to breakdown of skin: Secondary | ICD-10-CM | POA: Diagnosis not present

## 2020-07-10 DIAGNOSIS — H401133 Primary open-angle glaucoma, bilateral, severe stage: Secondary | ICD-10-CM | POA: Diagnosis not present

## 2020-07-10 DIAGNOSIS — E113211 Type 2 diabetes mellitus with mild nonproliferative diabetic retinopathy with macular edema, right eye: Secondary | ICD-10-CM | POA: Diagnosis not present

## 2020-07-10 DIAGNOSIS — L97411 Non-pressure chronic ulcer of right heel and midfoot limited to breakdown of skin: Secondary | ICD-10-CM

## 2020-07-10 NOTE — Progress Notes (Signed)
Office Visit Note   Patient: [name]           Date of Birth: 09-12-1940           MRN: [id number] Visit Date: 07/10/2020              Requested by: [name], MD [address] [place],  [place] [address] PCP: [name], MD  Chief Complaint  Patient presents with  . Right Foot - Pain  . Left Foot - Pain      HPI: Patient is a 80 year old gentleman status post bilateral transmetatarsal amputation who presents with ulcerations on both feet.  Patient states he recently has obtained new orthotics.  Assessment & Plan: Visit Diagnoses:  1. Midfoot skin ulcer, right, limited to breakdown of skin ([place])   2. Midfoot ulceration, left, limited to breakdown of skin ([place])     Plan: Ulcers were debrided on both feet the largest on the left foot.  Recommended follow-up in 4 weeks for close observation of the left foot ulcer.  Follow-Up Instructions: Return in about 4 weeks (around 08/07/2020).   Ortho Exam  Patient is alert, oriented, no adenopathy, well-dressed, normal affect, normal respiratory effort. Examination patient has a small ulcer on the left and right foot with a large ulcer on the left midfoot.  The calluses were pared from the 2 small ulcers.  After informed consent a 10 blade knife was used to debride the skin and soft tissue back to healthy viable granulation tissue of the left midfoot ulcer.  Silver nitrate was used for hemostasis the ulcer is 5 cm in diameter and 3 mm deep there is no exposed bone or tendon no cellulitis no drainage no odor no signs of infection.  Imaging: No results found. No images are attached to the encounter.  Labs: Lab Results  Component Value Date   HGBA1C 5.7 (H) 03/21/2020   HGBA1C 5.4 11/01/2018   HGBA1C 5.2 05/29/2018   ESRSEDRATE 50 (H) 03/20/2020   ESRSEDRATE 50 (H) 03/17/2020   ESRSEDRATE 34 (H) 12/17/2017   CRP 13.5 (H) 03/25/2020   CRP 0.8 03/17/2020   CRP 17.1 (H) 12/17/2017    REPTSTATUS 03/25/2020 FINAL 03/19/2020   REPTSTATUS 03/25/2020 FINAL 03/19/2020   REPTSTATUS 03/21/2020 FINAL 03/19/2020   GRAMSTAIN  03/17/2020    FEW WBC PRESENT, PREDOMINANTLY PMN FEW GRAM POSITIVE COCCI    CULT  03/19/2020    NO GROWTH 5 DAYS Performed at [facility], [place] [address] [place], [place] [address]    CULT  03/19/2020    NO GROWTH 5 DAYS Performed at [facility] [address] [place], [place] [address]    CULT (A) 03/19/2020    <10,000 COLONIES/mL INSIGNIFICANT GROWTH Performed at [facility] [address], [place], [place] [address]    LABORGA METHICILLIN RESISTANT STAPHYLOCOCCUS AUREUS 03/17/2020     Lab Results  Component Value Date   ALBUMIN 3.5 03/19/2020   ALBUMIN 3.7 02/05/2020   ALBUMIN 3.2 (L) 10/31/2018    Lab Results  Component Value Date   MG 1.9 03/27/2020   MG 1.6 (L) 03/26/2020   MG 1.4 (L) 03/25/2020   No results found for: VD25OH  No results found for: PREALBUMIN CBC EXTENDED Latest Ref Rng & Units 03/27/2020 03/26/2020 03/25/2020  WBC 4.0 - 10.5 K/uL 8.9 8.5 8.2  RBC 4.22 - 5.81 MIL/uL 3.36(L) 3.57(L) 2.99(L)  HGB 13.0 - 17.0 g/dL 9.6(L) 10.1(L) 8.3(L)  HCT  39.0 - 52.0 % 29.5(L) 31.2(L) 26.0(L)  PLT 150 - 400 K/uL 280 218 203  NEUTROABS 1.7 - 7.7 K/uL 5.4 5.3 5.3  LYMPHSABS 0.7 - 4.0 K/uL 1.4 1.2 1.2     There is no height or weight on file to calculate BMI.  Orders:  No orders of the defined types were placed in this encounter.  No orders of the defined types were placed in this encounter.    Procedures: No procedures performed  Clinical Data: No additional findings.  ROS:  All other systems negative, except as noted in the HPI. Review of Systems  Objective: Vital Signs: There were no vitals taken for this visit.  Specialty Comments:  No specialty comments available.  PMFS History: Patient Active Problem List   Diagnosis Date Noted  . History of partial amputation of toe of right  foot ([place]) 04/12/2020  . Acute encephalopathy 03/20/2020  . SIRS (systemic inflammatory response syndrome) ([place]) 03/20/2020  . 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[place]) 03/20/2020  . ARF (acute renal failure) ([place]) 03/20/2020  . Midfoot ulcer, left, limited to breakdown of skin ([place]) 12/29/2018  . Occipital stroke ([place]) 10/31/2018  . DM2 (diabetes mellitus, type 2) ([place]) 10/31/2018  . NSTEMI (non-ST elevated myocardial infarction) ([place]) 05/28/2018  . Syncope 05/28/2018  . HTN (hypertension) 05/28/2018  . Hyperlipemia, mixed 05/28/2018  . Rash and nonspecific skin eruption 12/16/2017  . Pain in right hand 09/11/2017  . Carpal tunnel syndrome, right upper limb 07/01/2017  . Arthritis of carpometacarpal [facility]) joint of left thumb 12/30/2016  . Midfoot skin ulcer, right, limited to breakdown of skin ([place]) 07/22/2016  . 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[place]) 09/23/2014  . Osteomyelitis of ankle or foot, left, acute ([place]) 08/23/2014  . Cellulitis 10/12/2012  . Chest pain 10/12/2012  . Tobacco abuse 10/12/2012  . ABSCESS, AXILLA, LEFT 02/23/2008  . 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 CHEST WALL PAIN, ACUTE 12/03/2007  . CANDIDIASIS, GLANS PENIS 09/16/2007  . HEADACHE 08/04/2007  . 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 Other and unspecified hyperlipidemia 03/05/2007  . GOUT 03/05/2007  . ERECTILE DYSFUNCTION 03/05/2007  . EXTERNAL HEMORRHOIDS 03/05/2007  . VENTRAL HERNIA 03/05/2007  . FATTY LIVER DISEASE 03/05/2007  . Osteoarthritis 03/05/2007  . HEMORRHOIDS, INTERNAL 03/17/2005  . COLONIC POLYPS, HX OF 03/17/2005   Past Medical History:  Diagnosis Date  . Arthritis   . Diabetic neuropathy ([place])   . High cholesterol   . Hypertension    denies  . Osteomyelitis (HCC)    Right great toe  . Peripheral vascular disease ([place])    diabetic  with osteomylitis  . Stroke (cerebrum) ([place]) 10/2018  . Type  II diabetes mellitus (HCC)     Family History  Problem Relation Age of Onset  . Diabetes Mellitus II Other   . Anesthesia problems Neg Hx     Past Surgical History:  Procedure Laterality Date  . AMPUTATION  07/23/2011   Procedure: AMPUTATION DIGIT;  Surgeon: Newt Minion, MD;  Location: [facility];  Service: Orthopedics;  Laterality: Right;  Right Great Toe Amputation  . AMPUTATION Right 09/18/2012   Procedure: Right Foot 2nd Ray Amputation;  Surgeon: Newt Minion, MD;  Location: [facility];  Service: Orthopedics;  Laterality: Right;  Right Foot 2nd Ray Amputation  . AMPUTATION Right 10/14/2012   Procedure: AMPUTATION FOOT;  Surgeon: Newt Minion, MD;  Location: [facility];  Service: Orthopedics;  Laterality: Right;  Right Foot Transmetatarsal Amputation  . AMPUTATION Left 09/23/2014   Procedure: Left Foot Transmetatarsal Amputation;  Surgeon: Newt Minion, MD;  Location: [facility];  Service: Orthopedics;  Laterality: Left;  . BACK SURGERY     lower  . CARPAL TUNNEL RELEASE Right 01/30/2018   Procedure: RIGHT CARPAL TUNNEL RELEASE;  Surgeon: Newt Minion, MD;  Location: [facility];  Service: Orthopedics;  Laterality: Right;  . CIRCUMCISION  2010  . COLONOSCOPY    . FOOT FRACTURE SURGERY Left 1970's   "broke it playing football" (10/12/2012)  . HUMERUS FRACTURE SURGERY W/ IMPLANT Right 1960's   "put a plate in it" (QA348G)  . I & D EXTREMITY Right 03/24/2020   Procedure: EXCISION RIGHT MEDIAL CUNEFORM;  Surgeon: Newt Minion, MD;  Location: [facility];  Service: Orthopedics;  Laterality: Right;  . INCISION AND DRAINAGE OF WOUND Left 2006   "foot" (10/12/2012)  . LUMBAR [place] SURGERY  2008   Social History   Occupational History  . Not on file  Tobacco Use  . Smoking status: Current Every Day Smoker    Years: 12.00    Types: Pipe  . Smokeless tobacco: Never Used  Vaping Use  . Vaping Use: Never used  Substance and Sexual Activity  . Alcohol use: No    Alcohol/week: 0.0 standard drinks  . Drug use: No   . Sexual activity: Not Currently

## 2020-07-17 DIAGNOSIS — E1169 Type 2 diabetes mellitus with other specified complication: Secondary | ICD-10-CM | POA: Diagnosis not present

## 2020-07-17 DIAGNOSIS — E11621 Type 2 diabetes mellitus with foot ulcer: Secondary | ICD-10-CM | POA: Diagnosis not present

## 2020-07-17 DIAGNOSIS — E1142 Type 2 diabetes mellitus with diabetic polyneuropathy: Secondary | ICD-10-CM | POA: Diagnosis not present

## 2020-07-17 DIAGNOSIS — N179 Acute kidney failure, unspecified: Secondary | ICD-10-CM | POA: Diagnosis not present

## 2020-07-17 DIAGNOSIS — M19041 Primary osteoarthritis, right hand: Secondary | ICD-10-CM | POA: Diagnosis not present

## 2020-07-17 DIAGNOSIS — E785 Hyperlipidemia, unspecified: Secondary | ICD-10-CM | POA: Diagnosis not present

## 2020-07-17 DIAGNOSIS — M19049 Primary osteoarthritis, unspecified hand: Secondary | ICD-10-CM | POA: Diagnosis not present

## 2020-07-24 ENCOUNTER — Telehealth: Payer: Self-pay | Admitting: Orthopedic Surgery

## 2020-07-24 NOTE — Telephone Encounter (Signed)
Pt called wanting to know who put him on blood thinners and why? He also states he believes he's been taking a medicine he doesn't think he should be on; he would like a CB to discuss further.   [phone]

## 2020-07-24 NOTE — Telephone Encounter (Signed)
I called and sw pt to advise that we did not give rx for blood thinner that he should call his PCp about this. We would not have given this medication. Pt states that he is still taking gabapentin and that he thinks this is causing him to have increased pain in his feet. " they burn and sting all night and I can;t get any rest" I advised the pt that we had tried changing him to lyrica to see if this would help and asked if he ever tried medication. Advised we had talked about him stopping his gabapentin and starting lyrica that he could not take the two of them together. Pt says that he never picked this up from the pharm. Advised that he has an appt on 08/07/20 and he can discuss medication and possible change with Dr. Sharol Given at that time. He states that he is not going to take gabapentin anymore regardless and will talk with Dr. Sharol Given at his appt. Will call with any other questions.

## 2020-07-29 DIAGNOSIS — E1151 Type 2 diabetes mellitus with diabetic peripheral angiopathy without gangrene: Secondary | ICD-10-CM | POA: Diagnosis not present

## 2020-07-29 DIAGNOSIS — I2101 ST elevation (STEMI) myocardial infarction involving left main coronary artery: Secondary | ICD-10-CM | POA: Diagnosis not present

## 2020-07-29 DIAGNOSIS — I639 Cerebral infarction, unspecified: Secondary | ICD-10-CM | POA: Diagnosis not present

## 2020-07-29 DIAGNOSIS — S98922D Partial traumatic amputation of left foot, level unspecified, subsequent encounter: Secondary | ICD-10-CM | POA: Diagnosis not present

## 2020-08-07 ENCOUNTER — Ambulatory Visit (INDEPENDENT_AMBULATORY_CARE_PROVIDER_SITE_OTHER): Payer: Medicare Other | Admitting: Orthopedic Surgery

## 2020-08-07 ENCOUNTER — Encounter: Payer: Self-pay | Admitting: Orthopedic Surgery

## 2020-08-07 VITALS — Ht 73.0 in | Wt 187.0 lb

## 2020-08-07 DIAGNOSIS — L97411 Non-pressure chronic ulcer of right heel and midfoot limited to breakdown of skin: Secondary | ICD-10-CM

## 2020-08-07 DIAGNOSIS — L97421 Non-pressure chronic ulcer of left heel and midfoot limited to breakdown of skin: Secondary | ICD-10-CM

## 2020-08-08 ENCOUNTER — Encounter: Payer: Self-pay | Admitting: Orthopedic Surgery

## 2020-08-08 NOTE — Progress Notes (Signed)
Office Visit Note   Patient: [name]           Date of Birth: 24-May-1941           MRN: [id number] Visit Date: 08/07/2020              Requested by: [name], MD [address] [place],  [place] [address] PCP: [name], MD  Chief Complaint  Patient presents with  . Right Foot - Follow-up    Bilateral foot ulcers  . Left Foot - Follow-up      HPI: Patient is a 80 year old gentleman who presents in follow-up for both lower extremities.  Patient states that he is no longer taking the Lyrica is taking Neurontin 300 mg twice a day.  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  Assessment & Plan: Visit Diagnoses:  1. Midfoot ulceration, left, limited to breakdown of skin ([place])   2. Midfoot skin ulcer, right, limited to breakdown of skin [facility])     Plan: Recommend patient consider getting a lift chair.  This will help him get from a sitting to a standing position he currently cannot get from a sitting to a standing position on his own.  Follow-Up Instructions: Return in about 4 weeks (around 09/04/2020).   Ortho Exam  Patient is alert, oriented, no adenopathy, well-dressed, normal affect, normal respiratory effort. Examination patient has 2 ulcers on the plantar aspect left foot 1 ulcer on the right foot all ulcers are 1 cm in diameter he has a transmetatarsal amputation bilaterally.  After informed consent a 10 blade knife was used to debride the skin and soft tissue back to healthy viable tissue the left foot ulcers after debridement are 3 cm in diameter and 2 cm in diameter in both her 2 mm deep silver nitrate was used for hemostasis there is no exposed bone or tendon no abscess there is healthy tissue at the base of the wounds.  Right foot ulcer was pared x1.  Patient cannot get up from a sitting to a standing position without assistance I have recommended he consider a lift chair.  Imaging: No  results found. No images are attached to the encounter.  Labs: Lab Results  Component Value Date   HGBA1C 5.7 (H) 03/21/2020   HGBA1C 5.4 11/01/2018   HGBA1C 5.2 05/29/2018   ESRSEDRATE 50 (H) 03/20/2020   ESRSEDRATE 50 (H) 03/17/2020   ESRSEDRATE 34 (H) 12/17/2017   CRP 13.5 (H) 03/25/2020   CRP 0.8 03/17/2020   CRP 17.1 (H) 12/17/2017   REPTSTATUS 03/25/2020 FINAL 03/19/2020   REPTSTATUS 03/25/2020 FINAL 03/19/2020   REPTSTATUS 03/21/2020 FINAL 03/19/2020   GRAMSTAIN  03/17/2020    FEW WBC PRESENT, PREDOMINANTLY PMN FEW GRAM POSITIVE COCCI    CULT  03/19/2020    NO GROWTH 5 DAYS Performed at [facility], [place] [address] [place], [place] [address]    CULT  03/19/2020    NO GROWTH 5 DAYS Performed at [facility] [address] [place], [place] [address]    CULT (A) 03/19/2020    <10,000 COLONIES/mL INSIGNIFICANT GROWTH Performed at [facility] [address] [place], [place] [address]    LABORGA METHICILLIN RESISTANT STAPHYLOCOCCUS AUREUS 03/17/2020     Lab Results  Component Value Date   ALBUMIN 3.5 03/19/2020   ALBUMIN 3.7 02/05/2020   ALBUMIN 3.2 (L) 10/31/2018    Lab Results  Component Value Date   MG 1.9 03/27/2020   MG 1.6 (L) 03/26/2020   MG 1.4 (L) 03/25/2020   No results found for: VD25OH  No results found for: PREALBUMIN CBC EXTENDED Latest Ref Rng & Units 03/27/2020 03/26/2020 03/25/2020  WBC 4.0 - 10.5 K/uL 8.9 8.5 8.2  RBC 4.22 - 5.81 MIL/uL 3.36(L) 3.57(L) 2.99(L)  HGB 13.0 - 17.0 g/dL 9.6(L) 10.1(L) 8.3(L)  HCT 39.0 - 52.0 % 29.5(L) 31.2(L) 26.0(L)  PLT 150 - 400 K/uL 280 218 203  NEUTROABS 1.7 - 7.7 K/uL 5.4 5.3 5.3  LYMPHSABS 0.7 - 4.0 K/uL 1.4 1.2 1.2     Body mass index is 24.67 kg/m.  Orders:  No orders of the defined types were placed in this encounter.  No orders of the defined types were placed in this encounter.    Procedures: No procedures performed  Clinical Data: No additional  findings.  ROS:  All other systems negative, except as noted in the HPI. Review of Systems  Objective: Vital Signs: Ht 6\' 1"  (1.854 m)   Wt 187 lb (84.8 kg)   BMI 24.67 kg/m   Specialty Comments:  No specialty comments available.  PMFS History: Patient Active Problem List   Diagnosis Date Noted  . 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 Acute encephalopathy 03/20/2020  . SIRS (systemic inflammatory response syndrome) ([place]) 03/20/2020  . 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[place]) 03/20/2020  . ARF (acute renal failure) ([place]) 03/20/2020  . Midfoot ulcer, left, limited to breakdown of skin ([place]) 12/29/2018  . Occipital stroke ([place]) 10/31/2018  . DM2 (diabetes mellitus, type 2) ([place]) 10/31/2018  . NSTEMI (non-ST elevated myocardial infarction) ([place]) 05/28/2018  . Syncope 05/28/2018  . HTN (hypertension) 05/28/2018  . Hyperlipemia, mixed 05/28/2018  . Rash and nonspecific skin eruption 12/16/2017  . Pain in right hand 09/11/2017  . Carpal tunnel syndrome, right upper limb 07/01/2017  . Arthritis of carpometacarpal [facility]) joint of left thumb 12/30/2016  . Midfoot skin ulcer, right, limited to breakdown of skin ([place]) 07/22/2016  . 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[place]) 09/23/2014  . Osteomyelitis of ankle or foot, left, acute ([place]) 08/23/2014  . Cellulitis 10/12/2012  . Chest pain 10/12/2012  . Tobacco abuse 10/12/2012  . ABSCESS, AXILLA, LEFT 02/23/2008  . 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 CHEST WALL PAIN, ACUTE 12/03/2007  . CANDIDIASIS, GLANS PENIS 09/16/2007  . HEADACHE 08/04/2007  . 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 Other and unspecified hyperlipidemia 03/05/2007  . GOUT 03/05/2007  . ERECTILE DYSFUNCTION 03/05/2007  . EXTERNAL HEMORRHOIDS 03/05/2007  . VENTRAL HERNIA 03/05/2007  . FATTY LIVER DISEASE 03/05/2007  . Osteoarthritis 03/05/2007  .  HEMORRHOIDS, INTERNAL 03/17/2005  . COLONIC POLYPS, HX OF 03/17/2005   Past Medical History:  Diagnosis Date  . Arthritis   . Diabetic neuropathy ([place])   . High cholesterol   . Hypertension    denies  . Osteomyelitis (HCC)    Right great toe  . Peripheral vascular disease ([place])    diabetic  with osteomylitis  . Stroke (cerebrum) ([place]) 10/2018  . Type II diabetes mellitus (HCC)     Family History  Problem Relation Age of Onset  . Diabetes Mellitus II Other   . Anesthesia problems Neg Hx     Past Surgical History:  Procedure Laterality Date  . AMPUTATION  07/23/2011   Procedure: AMPUTATION DIGIT;  Surgeon: Beverely Low  Fernanda Drum, MD;  Location: [facility];  Service: Orthopedics;  Laterality: Right;  Right Great Toe Amputation  . AMPUTATION Right 09/18/2012   Procedure: Right Foot 2nd Ray Amputation;  Surgeon: Newt Minion, MD;  Location: [facility];  Service: Orthopedics;  Laterality: Right;  Right Foot 2nd Ray Amputation  . AMPUTATION Right 10/14/2012   Procedure: AMPUTATION FOOT;  Surgeon: Newt Minion, MD;  Location: [facility];  Service: Orthopedics;  Laterality: Right;  Right Foot Transmetatarsal Amputation  . AMPUTATION Left 09/23/2014   Procedure: Left Foot Transmetatarsal Amputation;  Surgeon: Newt Minion, MD;  Location: [facility];  Service: Orthopedics;  Laterality: Left;  . BACK SURGERY     lower  . CARPAL TUNNEL RELEASE Right 01/30/2018   Procedure: RIGHT CARPAL TUNNEL RELEASE;  Surgeon: Newt Minion, MD;  Location: [facility];  Service: Orthopedics;  Laterality: Right;  . CIRCUMCISION  2010  . COLONOSCOPY    . FOOT FRACTURE SURGERY Left 1970's   "broke it playing football" (10/12/2012)  . HUMERUS FRACTURE SURGERY W/ IMPLANT Right 1960's   "put a plate in it" (1/50/5697)  . I & D EXTREMITY Right 03/24/2020   Procedure: EXCISION RIGHT MEDIAL CUNEFORM;  Surgeon: Newt Minion, MD;  Location: [facility];  Service: Orthopedics;  Laterality: Right;  . INCISION AND DRAINAGE OF WOUND Left 2006   "foot"  (10/12/2012)  . LUMBAR [place] SURGERY  2008   Social History   Occupational History  . Not on file  Tobacco Use  . Smoking status: Current Every Day Smoker    Years: 12.00    Types: Pipe  . Smokeless tobacco: Never Used  Vaping Use  . Vaping Use: Never used  Substance and Sexual Activity  . Alcohol use: No    Alcohol/week: 0.0 standard drinks  . Drug use: No  . Sexual activity: Not Currently

## 2020-08-09 ENCOUNTER — Telehealth: Payer: Self-pay

## 2020-08-09 NOTE — Telephone Encounter (Signed)
[facility] called stating that patient was seen today and we are having him take Gabapentin 300mg  BID; his daughter states patient says that is too much, maybe take 300mg  once a day? Either way , they are calling stating patient needs new script for this; his last script was written 07/2019 [facility]

## 2020-08-11 ENCOUNTER — Other Ambulatory Visit: Payer: Self-pay | Admitting: Orthopedic Surgery

## 2020-08-11 MED ORDER — GABAPENTIN 300 MG PO CAPS: 300.0000 mg | ORAL_CAPSULE | Freq: Every day | ORAL | 3 refills | Status: DC

## 2020-08-11 NOTE — Telephone Encounter (Signed)
Pt is no longer taking Lyrica and asks for Gabapentin ( which he has been on before) to be sent to pharm. Only wants to take 300 mg once a day. Can you please write for this?

## 2020-08-11 NOTE — Telephone Encounter (Signed)
Rx went to [facility]

## 2020-08-14 ENCOUNTER — Telehealth: Payer: Self-pay | Admitting: Physician Assistant

## 2020-08-14 ENCOUNTER — Other Ambulatory Visit: Payer: Self-pay | Admitting: Family

## 2020-08-14 DIAGNOSIS — M19049 Primary osteoarthritis, unspecified hand: Secondary | ICD-10-CM | POA: Diagnosis not present

## 2020-08-14 DIAGNOSIS — E1142 Type 2 diabetes mellitus with diabetic polyneuropathy: Secondary | ICD-10-CM | POA: Diagnosis not present

## 2020-08-14 DIAGNOSIS — N179 Acute kidney failure, unspecified: Secondary | ICD-10-CM | POA: Diagnosis not present

## 2020-08-14 DIAGNOSIS — E11621 Type 2 diabetes mellitus with foot ulcer: Secondary | ICD-10-CM | POA: Diagnosis not present

## 2020-08-14 DIAGNOSIS — E785 Hyperlipidemia, unspecified: Secondary | ICD-10-CM | POA: Diagnosis not present

## 2020-08-14 DIAGNOSIS — E1169 Type 2 diabetes mellitus with other specified complication: Secondary | ICD-10-CM | POA: Diagnosis not present

## 2020-08-14 DIAGNOSIS — M19041 Primary osteoarthritis, right hand: Secondary | ICD-10-CM | POA: Diagnosis not present

## 2020-08-14 NOTE — Telephone Encounter (Signed)
Received call from [facility] with [facility] advised patient need Rx refilled Gabapentin. The number to contact patient is [phone]

## 2020-08-15 NOTE — Telephone Encounter (Signed)
I called and sw pt to advise that this was done last week. Will call with any other questions.

## 2020-08-17 DIAGNOSIS — H401133 Primary open-angle glaucoma, bilateral, severe stage: Secondary | ICD-10-CM | POA: Diagnosis not present

## 2020-08-17 DIAGNOSIS — Z961 Presence of intraocular lens: Secondary | ICD-10-CM | POA: Diagnosis not present

## 2020-08-17 DIAGNOSIS — H04123 Dry eye syndrome of bilateral lacrimal glands: Secondary | ICD-10-CM | POA: Diagnosis not present

## 2020-08-21 DIAGNOSIS — M19049 Primary osteoarthritis, unspecified hand: Secondary | ICD-10-CM | POA: Diagnosis not present

## 2020-08-21 DIAGNOSIS — Z8673 Personal history of transient ischemic attack (TIA), and cerebral infarction without residual deficits: Secondary | ICD-10-CM | POA: Diagnosis not present

## 2020-08-21 DIAGNOSIS — E1142 Type 2 diabetes mellitus with diabetic polyneuropathy: Secondary | ICD-10-CM | POA: Diagnosis not present

## 2020-08-21 DIAGNOSIS — E785 Hyperlipidemia, unspecified: Secondary | ICD-10-CM | POA: Diagnosis not present

## 2020-08-21 DIAGNOSIS — E1165 Type 2 diabetes mellitus with hyperglycemia: Secondary | ICD-10-CM | POA: Diagnosis not present

## 2020-08-21 DIAGNOSIS — E1169 Type 2 diabetes mellitus with other specified complication: Secondary | ICD-10-CM | POA: Diagnosis not present

## 2020-08-24 DIAGNOSIS — H401133 Primary open-angle glaucoma, bilateral, severe stage: Secondary | ICD-10-CM | POA: Diagnosis not present

## 2020-08-26 DIAGNOSIS — I639 Cerebral infarction, unspecified: Secondary | ICD-10-CM | POA: Diagnosis not present

## 2020-08-26 DIAGNOSIS — E1151 Type 2 diabetes mellitus with diabetic peripheral angiopathy without gangrene: Secondary | ICD-10-CM | POA: Diagnosis not present

## 2020-08-26 DIAGNOSIS — S98922D Partial traumatic amputation of left foot, level unspecified, subsequent encounter: Secondary | ICD-10-CM | POA: Diagnosis not present

## 2020-08-26 DIAGNOSIS — I2101 ST elevation (STEMI) myocardial infarction involving left main coronary artery: Secondary | ICD-10-CM | POA: Diagnosis not present

## 2020-09-01 ENCOUNTER — Encounter (HOSPITAL_COMMUNITY): Payer: Self-pay | Admitting: Emergency Medicine

## 2020-09-01 ENCOUNTER — Emergency Department (HOSPITAL_COMMUNITY)
Admission: EM | Admit: 2020-09-01 | Discharge: 2020-09-01 | Disposition: A | Discharge status: Home / Self Care | Payer: Medicare Other | Attending: Emergency Medicine | Admitting: Emergency Medicine

## 2020-09-01 ENCOUNTER — Other Ambulatory Visit: Payer: Self-pay

## 2020-09-01 DIAGNOSIS — R6889 Other general symptoms and signs: Secondary | ICD-10-CM | POA: Diagnosis not present

## 2020-09-01 DIAGNOSIS — Z7984 Long term (current) use of oral hypoglycemic drugs: Secondary | ICD-10-CM | POA: Diagnosis not present

## 2020-09-01 DIAGNOSIS — R29810 Facial weakness: Secondary | ICD-10-CM | POA: Diagnosis not present

## 2020-09-01 DIAGNOSIS — I10 Essential (primary) hypertension: Secondary | ICD-10-CM | POA: Insufficient documentation

## 2020-09-01 DIAGNOSIS — Z743 Need for continuous supervision: Secondary | ICD-10-CM | POA: Diagnosis not present

## 2020-09-01 DIAGNOSIS — R531 Weakness: Secondary | ICD-10-CM | POA: Diagnosis not present

## 2020-09-01 DIAGNOSIS — G629 Polyneuropathy, unspecified: Secondary | ICD-10-CM

## 2020-09-01 DIAGNOSIS — E1140 Type 2 diabetes mellitus with diabetic neuropathy, unspecified: Secondary | ICD-10-CM | POA: Diagnosis not present

## 2020-09-01 DIAGNOSIS — R202 Paresthesia of skin: Secondary | ICD-10-CM | POA: Diagnosis present

## 2020-09-01 DIAGNOSIS — F17290 Nicotine dependence, other tobacco product, uncomplicated: Secondary | ICD-10-CM | POA: Insufficient documentation

## 2020-09-01 DIAGNOSIS — Z7902 Long term (current) use of antithrombotics/antiplatelets: Secondary | ICD-10-CM | POA: Diagnosis not present

## 2020-09-01 MED ORDER — HYPROMELLOSE (GONIOSCOPIC) 2.5 % OP SOLN: 1.0000 [drp] | Freq: Three times a day (TID) | OPHTHALMIC | 12 refills | Status: AC | PRN

## 2020-09-01 NOTE — ED Notes (Signed)
Pt vague with symptoms, states they are burning and hurting as well as lower legs. Pt states he was on the way to the doctor and come here. Poor historian in offering information.

## 2020-09-01 NOTE — ED Provider Notes (Signed)
[facility] DEPT Provider Note   CSN: [id number] Arrival date & time: 09/01/20  0700     History No chief complaint on file.   [name] is a 80 y.o. male.  80 year old male with prior medical history as detailed below presents for evaluation.  Patient complains of tingling discomfort to the anterior medial aspect of both shins.  This is a longstanding issue.  Patient denies fever.  He denies injury.  He denies difficulty ambulating.  Patient is also requesting a prescription for artificial tears.  The history is provided by the patient and medical records.  Illness Location:  "tingling shin pain" Severity:  Mild Onset quality:  Unable to specify Timing:  Unable to specify Progression:  Unable to specify Chronicity:  Chronic      Past Medical History:  Diagnosis Date  . Arthritis   . Diabetic neuropathy ([place])   . High cholesterol   . Hypertension    denies  . Osteomyelitis (HCC)    Right great toe  . Peripheral vascular disease ([place])    diabetic  with osteomylitis  . Stroke (cerebrum) ([place]) 10/2018  . Type II diabetes mellitus [facility])     Patient Active Problem List   Diagnosis Date Noted  . 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 Acute encephalopathy 03/20/2020  . SIRS (systemic inflammatory response syndrome) ([place]) 03/20/2020  . 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[place]) 03/20/2020  . ARF (acute renal failure) ([place]) 03/20/2020  . Midfoot ulcer, left, limited to breakdown of skin ([place]) 12/29/2018  . Occipital stroke ([place]) 10/31/2018  . DM2 (diabetes mellitus, type 2) ([place]) 10/31/2018  . NSTEMI (non-ST elevated myocardial infarction) ([place]) 05/28/2018  . Syncope 05/28/2018  . HTN (hypertension) 05/28/2018  . Hyperlipemia, mixed 05/28/2018  . Rash and nonspecific skin eruption 12/16/2017  . Pain in right hand 09/11/2017  . Carpal tunnel syndrome, right upper limb 07/01/2017  . Arthritis of carpometacarpal  [facility]) joint of left thumb 12/30/2016  . Midfoot skin ulcer, right, limited to breakdown of skin ([place]) 07/22/2016  . 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[place]) 09/23/2014  . Osteomyelitis of ankle or foot, left, acute ([place]) 08/23/2014  . Cellulitis 10/12/2012  . Chest pain 10/12/2012  . Tobacco abuse 10/12/2012  . ABSCESS, AXILLA, LEFT 02/23/2008  . 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 CHEST WALL PAIN, ACUTE 12/03/2007  . CANDIDIASIS, GLANS PENIS 09/16/2007  . HEADACHE 08/04/2007  . 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 Other and unspecified hyperlipidemia 03/05/2007  . GOUT 03/05/2007  . ERECTILE DYSFUNCTION 03/05/2007  . EXTERNAL HEMORRHOIDS 03/05/2007  . VENTRAL HERNIA 03/05/2007  . FATTY LIVER DISEASE 03/05/2007  . Osteoarthritis 03/05/2007  . HEMORRHOIDS, INTERNAL 03/17/2005  . COLONIC POLYPS, HX OF 03/17/2005    Past Surgical History:  Procedure Laterality Date  . AMPUTATION  07/23/2011   Procedure: AMPUTATION DIGIT;  Surgeon: Newt Minion, MD;  Location: [facility];  Service: Orthopedics;  Laterality: Right;  Right Great Toe Amputation  . AMPUTATION Right 09/18/2012   Procedure: Right Foot 2nd Ray Amputation;  Surgeon: Newt Minion, MD;  Location: [facility];  Service: Orthopedics;  Laterality: Right;  Right Foot 2nd Ray Amputation  . AMPUTATION Right 10/14/2012   Procedure: AMPUTATION FOOT;  Surgeon: Newt Minion, MD;  Location: [facility];  Service: Orthopedics;  Laterality: Right;  Right Foot Transmetatarsal Amputation  . AMPUTATION Left 09/23/2014  Procedure: Left Foot Transmetatarsal Amputation;  Surgeon: Newt Minion, MD;  Location: [facility];  Service: Orthopedics;  Laterality: Left;  . BACK SURGERY     lower  . CARPAL TUNNEL RELEASE Right 01/30/2018   Procedure: RIGHT CARPAL TUNNEL RELEASE;  Surgeon: Newt Minion, MD;  Location: [place] [facility];  Service: Orthopedics;  Laterality: Right;  . CIRCUMCISION  2010   . COLONOSCOPY    . FOOT FRACTURE SURGERY Left 1970's   "broke it playing football" (10/12/2012)  . HUMERUS FRACTURE SURGERY W/ IMPLANT Right 1960's   "put a plate in it" (10/03/6220)  . I & D EXTREMITY Right 03/24/2020   Procedure: EXCISION RIGHT MEDIAL CUNEFORM;  Surgeon: Newt Minion, MD;  Location: [facility];  Service: Orthopedics;  Laterality: Right;  . INCISION AND DRAINAGE OF WOUND Left 2006   "foot" (10/12/2012)  . [place] SURGERY  2008       Family History  Problem Relation Age of Onset  . Diabetes Mellitus II Other   . Anesthesia problems Neg Hx     Social History   Tobacco Use  . Smoking status: Current Every Day Smoker    Years: 12.00    Types: Pipe  . Smokeless tobacco: Never Used  Vaping Use  . Vaping Use: Never used  Substance Use Topics  . Alcohol use: No    Alcohol/week: 0.0 standard drinks  . Drug use: No    Home Medications Prior to Admission medications   Medication Sig Start Date End Date Taking? Authorizing Provider  hydroxypropyl methylcellulose / hypromellose (ISOPTO TEARS / GONIOVISC) 2.5 % ophthalmic solution Place 1 drop into both eyes 3 (three) times daily as needed for dry eyes. 09/01/20  Yes Valarie Merino, MD  acetaminophen (TYLENOL) 650 MG CR tablet Take 650 mg by mouth every 8 (eight) hours as needed for pain.    [provider]  atorvastatin (LIPITOR) 40 MG tablet Take 40 mg by mouth daily.    [provider]  clopidogrel (PLAVIX) 75 MG tablet Take 1 tablet (75 mg total) by mouth daily. 11/01/18   Mariel Aloe, MD  cycloSPORINE (RESTASIS) 0.05 % ophthalmic emulsion Place 1 drop into both eyes 2 (two) times daily.    [provider]  Dorzolamide HCl-Timolol Mal PF 2-0.5 % SOLN Place 1 drop into both eyes 2 (two) times daily. 03/28/20   Aline August, MD  gabapentin (NEURONTIN) 300 MG capsule Take 1 capsule (300 mg total) by mouth at bedtime. 08/11/20   Newt Minion, MD  latanoprost (XALATAN) 0.005 %  ophthalmic solution Place 1 drop into both eyes at bedtime.  09/12/14   [provider]  metFORMIN (GLUCOPHAGE) 500 MG tablet Take 500 mg by mouth daily.    [provider]  Multiple Vitamin (MULTIVITAMIN WITH MINERALS) TABS tablet Take 1 tablet by mouth daily. centrum    [provider]  traMADol (ULTRAM) 50 MG tablet Take 1 tablet (50 mg total) by mouth every 6 (six) hours as needed for moderate pain. 03/28/20   Persons, Bevely Palmer, PA    Allergies    Patient has no known allergies.  Review of Systems   Review of Systems  All other systems reviewed and are negative.   Physical Exam Updated Vital Signs BP 137/86 (BP Location: Left Arm)   Pulse 62   Temp 98 F (36.7 C) (Oral)   Resp 16   Ht 6\' 1"  (1.854 m)   Wt 86.2 kg   SpO2  100%   BMI 25.07 kg/m   Physical Exam Vitals and nursing note reviewed.  Constitutional:      General: He is not in acute distress.    Appearance: Normal appearance. He is well-developed.  HENT:     Head: Normocephalic and atraumatic.  Eyes:     Conjunctiva/sclera: Conjunctivae normal.     Pupils: Pupils are equal, round, and reactive to light.  Cardiovascular:     Rate and Rhythm: Normal rate and regular rhythm.     Heart sounds: Normal heart sounds.  Pulmonary:     Effort: Pulmonary effort is normal. No respiratory distress.     Breath sounds: Normal breath sounds.  Abdominal:     General: There is no distension.     Palpations: Abdomen is soft.     Tenderness: There is no abdominal tenderness.  Musculoskeletal:        General: No deformity. Normal range of motion.     Cervical back: Normal range of motion and neck supple.  Skin:    General: Skin is warm and dry.  Neurological:     Mental Status: He is alert and oriented to person, place, and time. Mental status is at baseline.     ED Results / Procedures / Treatments   Labs (all labs ordered are listed, but only abnormal results are displayed) Labs Reviewed  - No data to display  EKG None  Radiology No results found.  Procedures Procedures   Medications Ordered in ED Medications - No data to display  ED Course  I have reviewed the triage vital signs and the nursing notes.  Pertinent labs & imaging results that were available during my care of the patient were reviewed by me and considered in my medical decision making (see chart for details).    MDM Rules/Calculators/A&P                          MDM  Screen complete  [name] was evaluated in Emergency Department on 09/01/2020 for the symptoms described in the history of present illness. He was evaluated in the context of the global COVID-19 pandemic, which necessitated consideration that the patient might be at risk for infection with the SARS-CoV-2 virus that causes COVID-19. Institutional protocols and algorithms that pertain to the evaluation of patients at risk for COVID-19 are in a state of rapid change based on information released by regulatory bodies including the CDC and federal and state organizations. These policies and algorithms were followed during the patient's care in the ED.   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   No emergent condition present.  Advised close follow-up with his regular care provider.  Close follow-up is stressed.  Strict return precautions given understood.   Final Clinical Impression(s) / ED Diagnoses Final diagnoses:  Neuropathy    Rx / DC Orders ED Discharge Orders         Ordered    hydroxypropyl methylcellulose / hypromellose (ISOPTO TEARS / GONIOVISC) 2.5 % ophthalmic solution  3 times daily PRN        09/01/20 0859           Valarie Merino, MD 09/01/20 [phone]

## 2020-09-01 NOTE — ED Triage Notes (Signed)
BIB EMS from home, C/C bilateral foot pain and stinging, told EMS that it has been going on for a while (pt stated 1-2 days). Denies any recent injuries or trauma. Sight impaired, vision at baseline. CBG 110 w/ EMS.

## 2020-09-01 NOTE — Discharge Instructions (Addendum)
Return for any problem.  ?

## 2020-09-08 ENCOUNTER — Encounter: Payer: Self-pay | Admitting: Family

## 2020-09-08 ENCOUNTER — Ambulatory Visit (INDEPENDENT_AMBULATORY_CARE_PROVIDER_SITE_OTHER): Payer: Medicare Other | Admitting: Family

## 2020-09-08 DIAGNOSIS — L97421 Non-pressure chronic ulcer of left heel and midfoot limited to breakdown of skin: Secondary | ICD-10-CM | POA: Diagnosis not present

## 2020-09-08 DIAGNOSIS — Z89432 Acquired absence of left foot: Secondary | ICD-10-CM

## 2020-09-08 DIAGNOSIS — L97411 Non-pressure chronic ulcer of right heel and midfoot limited to breakdown of skin: Secondary | ICD-10-CM | POA: Diagnosis not present

## 2020-09-08 NOTE — Progress Notes (Signed)
Office Visit Note   Patient: [name]           Date of Birth: 1940-12-01           MRN: [id number] Visit Date: 09/08/2020              Requested by: [name], MD [address] ,  [place] [address] PCP: [name], MD  No chief complaint on file.     HPI: The patient is a 80 year old gentleman seen for follow-up of bilateral lower extremities.  He has stopped taking the Lyrica he is now just taking the Neurontin once a day.  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  He is in custom orthotics and extra-depth shoes.   Assessment & Plan: Visit Diagnoses:  1. Midfoot ulceration, left, limited to breakdown of skin ([place])   2. Midfoot skin ulcer, right, limited to breakdown of skin ([place])   3. Status post transmetatarsal amputation of left foot ([place])     Plan: Continue close monitoring of his feet.  Continue protective shoe wear.  He will follow-up in the office in 6 weeks  Follow-Up Instructions: Return in about 4 weeks (around 10/06/2020).   Ortho Exam  Patient is alert, oriented, no adenopathy, well-dressed, normal affect, normal respiratory effort. Examination patient has 2 ulcers on the plantar aspect left foot 1 ulcer on the right foot.  Under the distal medial column on the left foot there is a 2 cm in diameter callused ulceration this was debrided with a 10 blade knife back to viable tissue.  There is also ulceration beneath the midfoot this is 1 cm in diameter. after informed consent a 10 blade knife was used to debride the skin and soft tissue back to healthy viable tissue. there is no exposed bone or tendon no abscess there is healthy tissue at the base of the wounds.  Right foot ulcer was pared x1.  Right foot ulcer is 1 cm in diameter 1 mm thick there is no surrounding erythema no sign of infection  Imaging: No results  found. No images are attached to the encounter.  Labs: Lab Results  Component Value Date   HGBA1C 5.7 (H) 03/21/2020   HGBA1C 5.4 11/01/2018   HGBA1C 5.2 05/29/2018   ESRSEDRATE 50 (H) 03/20/2020   ESRSEDRATE 50 (H) 03/17/2020   ESRSEDRATE 34 (H) 12/17/2017   CRP 13.5 (H) 03/25/2020   CRP 0.8 03/17/2020   CRP 17.1 (H) 12/17/2017   REPTSTATUS 03/25/2020 FINAL 03/19/2020   REPTSTATUS 03/25/2020 FINAL 03/19/2020   REPTSTATUS 03/21/2020 FINAL 03/19/2020   GRAMSTAIN  03/17/2020    FEW WBC PRESENT, PREDOMINANTLY PMN FEW GRAM POSITIVE COCCI    CULT  03/19/2020    NO GROWTH 5 DAYS Performed at [facility], [place] [address] [place], [place] [address]    CULT  03/19/2020    NO GROWTH 5 DAYS Performed at [facility] [address] [place], [place] [address]    CULT (A) 03/19/2020    <10,000 COLONIES/mL INSIGNIFICANT GROWTH Performed at [facility] [address], [place], [place] [address]    LABORGA METHICILLIN RESISTANT STAPHYLOCOCCUS AUREUS 03/17/2020     Lab Results  Component Value Date   ALBUMIN 3.5 03/19/2020   ALBUMIN 3.7 02/05/2020   ALBUMIN 3.2 (L) 10/31/2018    Lab Results  Component Value Date   MG 1.9  03/27/2020   MG 1.6 (L) 03/26/2020   MG 1.4 (L) 03/25/2020   No results found for: VD25OH  No results found for: PREALBUMIN CBC EXTENDED Latest Ref Rng & Units 03/27/2020 03/26/2020 03/25/2020  WBC 4.0 - 10.5 K/uL 8.9 8.5 8.2  RBC 4.22 - 5.81 MIL/uL 3.36(L) 3.57(L) 2.99(L)  HGB 13.0 - 17.0 g/dL 9.6(L) 10.1(L) 8.3(L)  HCT 39.0 - 52.0 % 29.5(L) 31.2(L) 26.0(L)  PLT 150 - 400 K/uL 280 218 203  NEUTROABS 1.7 - 7.7 K/uL 5.4 5.3 5.3  LYMPHSABS 0.7 - 4.0 K/uL 1.4 1.2 1.2     There is no height or weight on file to calculate BMI.  Orders:  No orders of the defined types were placed in this encounter.  No orders of the defined types were placed in this encounter.    Procedures: No procedures performed  Clinical Data: No  additional findings.  ROS:  All other systems negative, except as noted in the HPI. Review of Systems  Constitutional: Negative for chills and fever.  Skin: Positive for wound.    Objective: Vital Signs: There were no vitals taken for this visit.  Specialty Comments:  No specialty comments available.  PMFS History: Patient Active Problem List   Diagnosis Date Noted  . 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 Acute encephalopathy 03/20/2020  . SIRS (systemic inflammatory response syndrome) ([place]) 03/20/2020  . 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[place]) 03/20/2020  . ARF (acute renal failure) ([place]) 03/20/2020  . Midfoot ulcer, left, limited to breakdown of skin ([place]) 12/29/2018  . Occipital stroke ([place]) 10/31/2018  . DM2 (diabetes mellitus, type 2) ([place]) 10/31/2018  . NSTEMI (non-ST elevated myocardial infarction) ([place]) 05/28/2018  . Syncope 05/28/2018  . HTN (hypertension) 05/28/2018  . Hyperlipemia, mixed 05/28/2018  . Rash and nonspecific skin eruption 12/16/2017  . Pain in right hand 09/11/2017  . Carpal tunnel syndrome, right upper limb 07/01/2017  . Arthritis of carpometacarpal [facility]) joint of left thumb 12/30/2016  . Midfoot skin ulcer, right, limited to breakdown of skin ([place]) 07/22/2016  . 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[place]) 09/23/2014  . Osteomyelitis of ankle or foot, left, acute ([place]) 08/23/2014  . Cellulitis 10/12/2012  . Chest pain 10/12/2012  . Tobacco abuse 10/12/2012  . ABSCESS, AXILLA, LEFT 02/23/2008  . 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 CHEST WALL PAIN, ACUTE 12/03/2007  . CANDIDIASIS, GLANS PENIS 09/16/2007  . HEADACHE 08/04/2007  . 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 Other and unspecified hyperlipidemia 03/05/2007  . GOUT 03/05/2007  . ERECTILE DYSFUNCTION 03/05/2007  . EXTERNAL HEMORRHOIDS 03/05/2007  . VENTRAL HERNIA 03/05/2007   . FATTY LIVER DISEASE 03/05/2007  . Osteoarthritis 03/05/2007  . HEMORRHOIDS, INTERNAL 03/17/2005  . COLONIC POLYPS, HX OF 03/17/2005   Past Medical History:  Diagnosis Date  . Arthritis   . Diabetic neuropathy ([place])   . High cholesterol   . Hypertension    denies  . Osteomyelitis (HCC)    Right great toe  . Peripheral vascular disease ([place])    diabetic  with osteomylitis  . Stroke (cerebrum) ([place]) 10/2018  . Type II diabetes mellitus (HCC)     Family History  Problem Relation Age of Onset  . Diabetes Mellitus II Other   . Anesthesia problems Neg Hx     Past Surgical History:  Procedure Laterality Date  . AMPUTATION  07/23/2011   Procedure: AMPUTATION DIGIT;  Surgeon: Newt Minion, MD;  Location: [facility];  Service: Orthopedics;  Laterality: Right;  Right Great Toe Amputation  . AMPUTATION Right 09/18/2012   Procedure: Right Foot 2nd Ray Amputation;  Surgeon: Newt Minion, MD;  Location: [facility];  Service: Orthopedics;  Laterality: Right;  Right Foot 2nd Ray Amputation  . AMPUTATION Right 10/14/2012   Procedure: AMPUTATION FOOT;  Surgeon: Newt Minion, MD;  Location: [facility];  Service: Orthopedics;  Laterality: Right;  Right Foot Transmetatarsal Amputation  . AMPUTATION Left 09/23/2014   Procedure: Left Foot Transmetatarsal Amputation;  Surgeon: Newt Minion, MD;  Location: [facility];  Service: Orthopedics;  Laterality: Left;  . BACK SURGERY     lower  . CARPAL TUNNEL RELEASE Right 01/30/2018   Procedure: RIGHT CARPAL TUNNEL RELEASE;  Surgeon: Newt Minion, MD;  Location: [facility];  Service: Orthopedics;  Laterality: Right;  . CIRCUMCISION  2010  . COLONOSCOPY    . FOOT FRACTURE SURGERY Left 1970's   "broke it playing football" (10/12/2012)  . HUMERUS FRACTURE SURGERY W/ IMPLANT Right 1960's   "put a plate in it" (0/81/4481)  . I & D EXTREMITY Right 03/24/2020   Procedure: EXCISION RIGHT MEDIAL CUNEFORM;  Surgeon: Newt Minion, MD;  Location: [facility];  Service: Orthopedics;  Laterality:  Right;  . INCISION AND DRAINAGE OF WOUND Left 2006   "foot" (10/12/2012)  . LUMBAR [place] SURGERY  2008   Social History   Occupational History  . Not on file  Tobacco Use  . Smoking status: Current Every Day Smoker    Years: 12.00    Types: Pipe  . Smokeless tobacco: Never Used  Vaping Use  . Vaping Use: Never used  Substance and Sexual Activity  . Alcohol use: No    Alcohol/week: 0.0 standard drinks  . Drug use: No  . Sexual activity: Not Currently

## 2020-09-14 DIAGNOSIS — E785 Hyperlipidemia, unspecified: Secondary | ICD-10-CM | POA: Diagnosis not present

## 2020-09-14 DIAGNOSIS — E1142 Type 2 diabetes mellitus with diabetic polyneuropathy: Secondary | ICD-10-CM | POA: Diagnosis not present

## 2020-09-14 DIAGNOSIS — M19049 Primary osteoarthritis, unspecified hand: Secondary | ICD-10-CM | POA: Diagnosis not present

## 2020-09-14 DIAGNOSIS — E1169 Type 2 diabetes mellitus with other specified complication: Secondary | ICD-10-CM | POA: Diagnosis not present

## 2020-09-14 DIAGNOSIS — N179 Acute kidney failure, unspecified: Secondary | ICD-10-CM | POA: Diagnosis not present

## 2020-09-14 DIAGNOSIS — E11621 Type 2 diabetes mellitus with foot ulcer: Secondary | ICD-10-CM | POA: Diagnosis not present

## 2020-09-14 DIAGNOSIS — M19041 Primary osteoarthritis, right hand: Secondary | ICD-10-CM | POA: Diagnosis not present

## 2020-09-26 DIAGNOSIS — I639 Cerebral infarction, unspecified: Secondary | ICD-10-CM | POA: Diagnosis not present

## 2020-09-26 DIAGNOSIS — I2101 ST elevation (STEMI) myocardial infarction involving left main coronary artery: Secondary | ICD-10-CM | POA: Diagnosis not present

## 2020-09-26 DIAGNOSIS — E1151 Type 2 diabetes mellitus with diabetic peripheral angiopathy without gangrene: Secondary | ICD-10-CM | POA: Diagnosis not present

## 2020-09-26 DIAGNOSIS — S98922D Partial traumatic amputation of left foot, level unspecified, subsequent encounter: Secondary | ICD-10-CM | POA: Diagnosis not present

## 2020-10-02 DIAGNOSIS — E1142 Type 2 diabetes mellitus with diabetic polyneuropathy: Secondary | ICD-10-CM | POA: Diagnosis not present

## 2020-10-05 DIAGNOSIS — E1169 Type 2 diabetes mellitus with other specified complication: Secondary | ICD-10-CM | POA: Diagnosis not present

## 2020-10-05 DIAGNOSIS — E1142 Type 2 diabetes mellitus with diabetic polyneuropathy: Secondary | ICD-10-CM | POA: Diagnosis not present

## 2020-10-05 DIAGNOSIS — N179 Acute kidney failure, unspecified: Secondary | ICD-10-CM | POA: Diagnosis not present

## 2020-10-05 DIAGNOSIS — E785 Hyperlipidemia, unspecified: Secondary | ICD-10-CM | POA: Diagnosis not present

## 2020-10-05 DIAGNOSIS — E11621 Type 2 diabetes mellitus with foot ulcer: Secondary | ICD-10-CM | POA: Diagnosis not present

## 2020-10-09 ENCOUNTER — Ambulatory Visit (INDEPENDENT_AMBULATORY_CARE_PROVIDER_SITE_OTHER): Payer: Medicare Other | Admitting: Physician Assistant

## 2020-10-09 ENCOUNTER — Encounter: Payer: Self-pay | Admitting: Orthopedic Surgery

## 2020-10-09 VITALS — Ht 73.0 in | Wt 190.0 lb

## 2020-10-09 DIAGNOSIS — Z89431 Acquired absence of right foot: Secondary | ICD-10-CM

## 2020-10-09 DIAGNOSIS — H401133 Primary open-angle glaucoma, bilateral, severe stage: Secondary | ICD-10-CM | POA: Diagnosis not present

## 2020-10-09 DIAGNOSIS — Z961 Presence of intraocular lens: Secondary | ICD-10-CM | POA: Diagnosis not present

## 2020-10-09 NOTE — Progress Notes (Signed)
Office Visit Note   Patient: [name]           Date of Birth: June 08, 1941           MRN: [id number] Visit Date: 10/09/2020              Requested by: [name], MD [address] [place],  [place] [address] PCP: [name], MD  Chief Complaint  Patient presents with  . Left Foot - Follow-up    Left midfoot ulcer      HPI: Patient is status post bilateral transmetatarsal amputations.  He comes in periodically for examination of his feet and debridement of thickened calluses especially on the plantar surface of his left amputation stump  Assessment & Plan: Visit Diagnoses: No diagnosis found.  Plan: Patient will follow up in 6 weeks or sooner if any concerns  Follow-Up Instructions: No follow-ups on file.   Ortho Exam  Patient is alert, oriented, no adenopathy, well-dressed, normal affect, normal respiratory effort. Bilateral transmetatarsal amputation stumps.  On the right side there is no ulcers or calluses or areas of breakdown.  No signs of infection.  On the left side there are no open ulcers no cellulitis or signs of infection.  He does have 2 calluses on the plantar surface which were debrided to soft healthy skin after obtaining verbal consent  Imaging: No results found. No images are attached to the encounter.  Labs: Lab Results  Component Value Date   HGBA1C 5.7 (H) 03/21/2020   HGBA1C 5.4 11/01/2018   HGBA1C 5.2 05/29/2018   ESRSEDRATE 50 (H) 03/20/2020   ESRSEDRATE 50 (H) 03/17/2020   ESRSEDRATE 34 (H) 12/17/2017   CRP 13.5 (H) 03/25/2020   CRP 0.8 03/17/2020   CRP 17.1 (H) 12/17/2017   REPTSTATUS 03/25/2020 FINAL 03/19/2020   REPTSTATUS 03/25/2020 FINAL 03/19/2020   REPTSTATUS 03/21/2020 FINAL 03/19/2020   GRAMSTAIN  03/17/2020    FEW WBC PRESENT, PREDOMINANTLY PMN FEW GRAM POSITIVE COCCI    CULT  03/19/2020    NO GROWTH 5 DAYS Performed at [facility], [place] [address] [place], [place] [address]     CULT  03/19/2020    NO GROWTH 5 DAYS Performed at [facility] [address] [place], [place] [address]    CULT (A) 03/19/2020    <10,000 COLONIES/mL INSIGNIFICANT GROWTH Performed at [facility] [address], [place], [place] [address]    LABORGA METHICILLIN RESISTANT STAPHYLOCOCCUS AUREUS 03/17/2020     Lab Results  Component Value Date   ALBUMIN 3.5 03/19/2020   ALBUMIN 3.7 02/05/2020   ALBUMIN 3.2 (L) 10/31/2018    Lab Results  Component Value Date   MG 1.9 03/27/2020   MG 1.6 (L) 03/26/2020   MG 1.4 (L) 03/25/2020   No results found for: VD25OH  No results found for: PREALBUMIN CBC EXTENDED Latest Ref Rng & Units 03/27/2020 03/26/2020 03/25/2020  WBC 4.0 - 10.5 K/uL 8.9 8.5 8.2  RBC 4.22 - 5.81 MIL/uL 3.36(L) 3.57(L) 2.99(L)  HGB 13.0 - 17.0 g/dL 9.6(L) 10.1(L) 8.3(L)  HCT 39.0 - 52.0 % 29.5(L) 31.2(L) 26.0(L)  PLT 150 - 400 K/uL 280 218 203  NEUTROABS 1.7 - 7.7 K/uL 5.4 5.3 5.3  LYMPHSABS 0.7 - 4.0 K/uL 1.4 1.2 1.2     Body mass index is 25.07 kg/m.  Orders:  No orders of the defined types were placed in this encounter.  No orders of the defined types were placed  in this encounter.    Procedures: No procedures performed  Clinical Data: No additional findings.  ROS:  All other systems negative, except as noted in the HPI. Review of Systems  Objective: Vital Signs: Ht 6\' 1"  (1.854 m)   Wt 190 lb (86.2 kg)   BMI 25.07 kg/m   Specialty Comments:  No specialty comments available.  PMFS History: Patient Active Problem List   Diagnosis Date Noted  . 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 ([place]) 04/12/2020  . Acute encephalopathy 03/20/2020  . SIRS (systemic inflammatory response syndrome) ([place]) 03/20/2020  . 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[place]) 03/20/2020  . ARF (acute renal failure) ([place]) 03/20/2020  . Midfoot ulcer, left, limited to breakdown of skin ([place]) 12/29/2018  . Occipital stroke ([place]) 10/31/2018  . DM2  (diabetes mellitus, type 2) ([place]) 10/31/2018  . NSTEMI (non-ST elevated myocardial infarction) ([place]) 05/28/2018  . Syncope 05/28/2018  . HTN (hypertension) 05/28/2018  . Hyperlipemia, mixed 05/28/2018  . Rash and nonspecific skin eruption 12/16/2017  . Pain in right hand 09/11/2017  . Carpal tunnel syndrome, right upper limb 07/01/2017  . Arthritis of carpometacarpal [facility]) joint of left thumb 12/30/2016  . Midfoot skin ulcer, right, limited to breakdown of skin ([place]) 07/22/2016  . 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[place]) 09/23/2014  . Osteomyelitis of ankle or foot, left, acute ([place]) 08/23/2014  . Cellulitis 10/12/2012  . Chest pain 10/12/2012  . Tobacco abuse 10/12/2012  . ABSCESS, AXILLA, LEFT 02/23/2008  . 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 CHEST WALL PAIN, ACUTE 12/03/2007  . CANDIDIASIS, GLANS PENIS 09/16/2007  . HEADACHE 08/04/2007  . 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 Other and unspecified hyperlipidemia 03/05/2007  . GOUT 03/05/2007  . ERECTILE DYSFUNCTION 03/05/2007  . EXTERNAL HEMORRHOIDS 03/05/2007  . VENTRAL HERNIA 03/05/2007  . FATTY LIVER DISEASE 03/05/2007  . Osteoarthritis 03/05/2007  . HEMORRHOIDS, INTERNAL 03/17/2005  . COLONIC POLYPS, HX OF 03/17/2005   Past Medical History:  Diagnosis Date  . Arthritis   . Diabetic neuropathy ([place])   . High cholesterol   . Hypertension    denies  . Osteomyelitis (HCC)    Right great toe  . Peripheral vascular disease ([place])    diabetic  with osteomylitis  . Stroke (cerebrum) ([place]) 10/2018  . Type II diabetes mellitus (HCC)     Family History  Problem Relation Age of Onset  . Diabetes Mellitus II Other   . Anesthesia problems Neg Hx     Past Surgical History:  Procedure Laterality Date  . AMPUTATION  07/23/2011   Procedure: AMPUTATION DIGIT;  Surgeon: Newt Minion, MD;  Location: [facility];  Service: Orthopedics;  Laterality:  Right;  Right Great Toe Amputation  . AMPUTATION Right 09/18/2012   Procedure: Right Foot 2nd Ray Amputation;  Surgeon: Newt Minion, MD;  Location: [facility];  Service: Orthopedics;  Laterality: Right;  Right Foot 2nd Ray Amputation  . AMPUTATION Right 10/14/2012   Procedure: AMPUTATION FOOT;  Surgeon: Newt Minion, MD;  Location: [facility];  Service: Orthopedics;  Laterality: Right;  Right Foot Transmetatarsal Amputation  . AMPUTATION Left 09/23/2014   Procedure: Left Foot Transmetatarsal Amputation;  Surgeon: Newt Minion, MD;  Location: [facility];  Service: Orthopedics;  Laterality: Left;  . BACK SURGERY     lower  . CARPAL TUNNEL RELEASE Right 01/30/2018   Procedure: RIGHT CARPAL TUNNEL RELEASE;  Surgeon: Meridee Score  V, MD;  Location: [facility];  Service: Orthopedics;  Laterality: Right;  . CIRCUMCISION  2010  . COLONOSCOPY    . FOOT FRACTURE SURGERY Left 1970's   "broke it playing football" (10/12/2012)  . HUMERUS FRACTURE SURGERY W/ IMPLANT Right 1960's   "put a plate in it" (4/40/3474)  . I & D EXTREMITY Right 03/24/2020   Procedure: EXCISION RIGHT MEDIAL CUNEFORM;  Surgeon: Newt Minion, MD;  Location: [facility];  Service: Orthopedics;  Laterality: Right;  . INCISION AND DRAINAGE OF WOUND Left 2006   "foot" (10/12/2012)  . LUMBAR [place] SURGERY  2008   Social History   Occupational History  . Not on file  Tobacco Use  . Smoking status: Current Every Day Smoker    Years: 12.00    Types: Pipe  . Smokeless tobacco: Never Used  Vaping Use  . Vaping Use: Never used  Substance and Sexual Activity  . Alcohol use: No    Alcohol/week: 0.0 standard drinks  . Drug use: No  . Sexual activity: Not Currently

## 2020-10-26 DIAGNOSIS — E1151 Type 2 diabetes mellitus with diabetic peripheral angiopathy without gangrene: Secondary | ICD-10-CM | POA: Diagnosis not present

## 2020-10-26 DIAGNOSIS — S98922D Partial traumatic amputation of left foot, level unspecified, subsequent encounter: Secondary | ICD-10-CM | POA: Diagnosis not present

## 2020-10-26 DIAGNOSIS — E11621 Type 2 diabetes mellitus with foot ulcer: Secondary | ICD-10-CM | POA: Diagnosis not present

## 2020-10-26 DIAGNOSIS — M19041 Primary osteoarthritis, right hand: Secondary | ICD-10-CM | POA: Diagnosis not present

## 2020-10-26 DIAGNOSIS — M19049 Primary osteoarthritis, unspecified hand: Secondary | ICD-10-CM | POA: Diagnosis not present

## 2020-10-26 DIAGNOSIS — E785 Hyperlipidemia, unspecified: Secondary | ICD-10-CM | POA: Diagnosis not present

## 2020-10-26 DIAGNOSIS — I639 Cerebral infarction, unspecified: Secondary | ICD-10-CM | POA: Diagnosis not present

## 2020-10-26 DIAGNOSIS — N179 Acute kidney failure, unspecified: Secondary | ICD-10-CM | POA: Diagnosis not present

## 2020-10-26 DIAGNOSIS — E1169 Type 2 diabetes mellitus with other specified complication: Secondary | ICD-10-CM | POA: Diagnosis not present

## 2020-10-26 DIAGNOSIS — I2101 ST elevation (STEMI) myocardial infarction involving left main coronary artery: Secondary | ICD-10-CM | POA: Diagnosis not present

## 2020-10-26 DIAGNOSIS — E1142 Type 2 diabetes mellitus with diabetic polyneuropathy: Secondary | ICD-10-CM | POA: Diagnosis not present

## 2020-11-04 ENCOUNTER — Emergency Department (HOSPITAL_COMMUNITY)
Admission: EM | Admit: 2020-11-04 | Discharge: 2020-11-04 | Disposition: A | Discharge status: Home / Self Care | Payer: Medicare Other | Attending: Emergency Medicine | Admitting: Emergency Medicine

## 2020-11-04 ENCOUNTER — Other Ambulatory Visit: Payer: Self-pay

## 2020-11-04 DIAGNOSIS — E1140 Type 2 diabetes mellitus with diabetic neuropathy, unspecified: Secondary | ICD-10-CM | POA: Diagnosis not present

## 2020-11-04 DIAGNOSIS — I10 Essential (primary) hypertension: Secondary | ICD-10-CM | POA: Diagnosis not present

## 2020-11-04 DIAGNOSIS — Z7902 Long term (current) use of antithrombotics/antiplatelets: Secondary | ICD-10-CM | POA: Insufficient documentation

## 2020-11-04 DIAGNOSIS — H539 Unspecified visual disturbance: Secondary | ICD-10-CM | POA: Diagnosis not present

## 2020-11-04 DIAGNOSIS — F17290 Nicotine dependence, other tobacco product, uncomplicated: Secondary | ICD-10-CM | POA: Insufficient documentation

## 2020-11-04 DIAGNOSIS — H547 Unspecified visual loss: Secondary | ICD-10-CM | POA: Diagnosis present

## 2020-11-04 MED ORDER — TETRACAINE HCL 0.5 % OP SOLN
2.0000 [drp] | Freq: Once | OPHTHALMIC | Status: AC
  Administered 2020-11-04: 2 [drp] via OPHTHALMIC
  Filled 2020-11-04: qty 4

## 2020-11-04 NOTE — Discharge Instructions (Signed)
1.  Follow-up with your ophthalmologist as soon as possible for recheck.  At this time your eye pressures are satisfactorily under control for your glaucoma. 2.  Return to the emergency department immediately if you have a sudden change in your vision, you develop a sudden and new weakness numbness tingling or difficulty using your arm or leg.  If you develop new difficulty speaking or understanding.  These may be signs of stroke and you should return immediately.

## 2020-11-04 NOTE — ED Provider Notes (Signed)
Emergency Medicine Provider Triage Evaluation Note  [name] , a 80 y.o. male  was evaluated in triage.  Pt complains of gradual vision worsening, ongoing problem, seen by ophthalmology at [facility], reports compliance with his eyedrops and has been released from [facility] back to PCP for management.  Patient's niece is at the bedside, states that he reports this visual complaint from time to time and every time she brings him to the emergency room for reassurance.  He denies pain in his eyes or redness.  Lives alone and is able to attend to ADLs without assistance at this time..  Review of Systems  Positive: Vision change Negative: Eye pain, redness  Physical Exam  BP 112/71 (BP Location: Left Arm)   Pulse (!) 58   Temp 98.7 F (37.1 C) (Oral)   Resp 16   Ht 6\' 1"  (1.854 m)   Wt 79.4 kg   SpO2 100%   BMI 23.09 kg/m  Gen:   Awake, no distress   Resp:  Normal effort  MSK:   Moves extremities without difficulty  Other:    Medical Decision Making  Medically screening exam initiated at 2:27 PM.  Appropriate orders placed.  Connell Bognar was informed that the remainder of the evaluation will be completed by another provider, this initial triage assessment does not replace that evaluation, and the importance of remaining in the ED until their evaluation is complete.     Tacy Learn, PA-C 11/04/20 1429    Charlesetta Shanks, MD 11/21/20 1048

## 2020-11-04 NOTE — ED Provider Notes (Signed)
[facility] DEPT Provider Note   CSN: [id number] Arrival date & time: 11/04/20  1355     History Chief Complaint  Patient presents with  . Loss of Vision    [name] is a 80 y.o. male.  HPI Patient presents for evaluation of visual change.  He is here with his niece.  There is no clear time of change from baseline.  This seems to wax and wane with the patient intermittently complaining of gradual worsening of his vision.  He does have known glaucoma and has been under specialty treatment at the [facility] clinic for glaucoma.  He reports that he just recently got cleared from their clinic and is referred back to his ophthalmologic primary physician.  Patient's niece reports they do have a follow-up appointment scheduled with Dr. Carolynn Sayers but is for about a month.  She reports they are trying to work him in.  Patient was examined within the past 3 weeks by his specialist from [facility].  Patient's niece reports that he always has significant difficulty with his vision and there has been a gradual and incremental visual loss.  Patient reports that seems like the vision in the left eye could be worsening.  He vacillated between described in the vision is having gone dark and then other times would describe it as being white or having just general light appearance.  Unfortunately he might apply this description to either eye at times and there was really no way to get any continuity of what degree of change or what that might represent.  He did pass visual testing on the right with 20/40.  Patient denies any other symptoms.  He has not had headaches he has not had any other weakness numbness or mental status change.  Patient's niece reports that he is functioning independently at his house and he was able to dress himself today and do his usual activities of self-feeding and self-care without change from baseline.  She reports that she or others frequently checking on him and make  sure he is doing all right but there was no apparent change in his level of function today in terms of independent care.  Patient's niece reports this has been a longstanding problem and they have been going to do for years and the patient will intermittently complain of changes and then come in for a check just to make sure everything is okay with the pressure in his eyes.    Past Medical History:  Diagnosis Date  . Arthritis   . Diabetic neuropathy ([place])   . High cholesterol   . Hypertension    denies  . Osteomyelitis (HCC)    Right great toe  . Peripheral vascular disease ([address])    diabetic  with osteomylitis  . Stroke (cerebrum) ([place]) 10/2018  . Type II diabetes mellitus [facility])     Patient Active Problem List   Diagnosis Date Noted  . 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 Acute encephalopathy 03/20/2020  . SIRS (systemic inflammatory response syndrome) ([place]) 03/20/2020  . 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[place]) 03/20/2020  . ARF (acute renal failure) ([place]) 03/20/2020  . Midfoot ulcer, left, limited to breakdown of skin ([place]) 12/29/2018  . Occipital stroke ([place]) 10/31/2018  . DM2 (diabetes mellitus, type 2) ([place]) 10/31/2018  . NSTEMI (non-ST elevated myocardial infarction) ([place]) 05/28/2018  . Syncope 05/28/2018  . HTN (hypertension) 05/28/2018  . Hyperlipemia, mixed 05/28/2018  . Rash and nonspecific skin  eruption 12/16/2017  . Pain in right hand 09/11/2017  . Carpal tunnel syndrome, right upper limb 07/01/2017  . Arthritis of carpometacarpal [facility]) joint of left thumb 12/30/2016  . Midfoot skin ulcer, right, limited to breakdown of skin ([place]) 07/22/2016  . 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 Osteomyelitis of ankle or foot, left, acute ([place]) 08/23/2014  . Cellulitis 10/12/2012  . Chest pain 10/12/2012  . Tobacco abuse 10/12/2012  . ABSCESS, AXILLA, LEFT 02/23/2008  . 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 CHEST WALL PAIN,  ACUTE 12/03/2007  . CANDIDIASIS, GLANS PENIS 09/16/2007  . HEADACHE 08/04/2007  . 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 Other and unspecified hyperlipidemia 03/05/2007  . GOUT 03/05/2007  . ERECTILE DYSFUNCTION 03/05/2007  . EXTERNAL HEMORRHOIDS 03/05/2007  . VENTRAL HERNIA 03/05/2007  . FATTY LIVER DISEASE 03/05/2007  . Osteoarthritis 03/05/2007  . HEMORRHOIDS, INTERNAL 03/17/2005  . COLONIC POLYPS, HX OF 03/17/2005    Past Surgical History:  Procedure Laterality Date  . AMPUTATION  07/23/2011   Procedure: AMPUTATION DIGIT;  Surgeon: Newt Minion, MD;  Location: [place];  Service: Orthopedics;  Laterality: Right;  Right Great Toe Amputation  . AMPUTATION Right 09/18/2012   Procedure: Right Foot 2nd Ray Amputation;  Surgeon: Newt Minion, MD;  Location: [place];  Service: Orthopedics;  Laterality: Right;  Right Foot 2nd Ray Amputation  . AMPUTATION Right 10/14/2012   Procedure: AMPUTATION FOOT;  Surgeon: Newt Minion, MD;  Location: [place];  Service: Orthopedics;  Laterality: Right;  Right Foot Transmetatarsal Amputation  . AMPUTATION Left 09/23/2014   Procedure: Left Foot Transmetatarsal Amputation;  Surgeon: Newt Minion, MD;  Location: [place];  Service: Orthopedics;  Laterality: Left;  . BACK SURGERY     lower  . CARPAL TUNNEL RELEASE Right 01/30/2018   Procedure: RIGHT CARPAL TUNNEL RELEASE;  Surgeon: Newt Minion, MD;  Location: [place];  Service: Orthopedics;  Laterality: Right;  . CIRCUMCISION  2010  . COLONOSCOPY    . FOOT FRACTURE SURGERY Left 1970's   "broke it playing football" (10/12/2012)  . HUMERUS FRACTURE SURGERY W/ IMPLANT Right 1960's   "put a plate in it" (07/19/5425)  . I & D EXTREMITY Right 03/24/2020   Procedure: EXCISION RIGHT MEDIAL CUNEFORM;  Surgeon: Newt Minion, MD;  Location: [place];  Service: Orthopedics;  Laterality: Right;  . INCISION AND DRAINAGE OF WOUND Left 2006   "foot"  (10/12/2012)  . [place] SURGERY  2008       Family History  Problem Relation Age of Onset  . Diabetes Mellitus II Other   . Anesthesia problems Neg Hx     Social History   Tobacco Use  . Smoking status: Current Every Day Smoker    Years: 12.00    Types: Pipe  . Smokeless tobacco: Never Used  Vaping Use  . Vaping Use: Never used  Substance Use Topics  . Alcohol use: No    Alcohol/week: 0.0 standard drinks  . Drug use: No    Home Medications Prior to Admission medications   Medication Sig Start Date End Date Taking? Authorizing Provider  atorvastatin (LIPITOR) 80 MG tablet Take 80 mg by mouth daily.   Yes [provider]  brimonidine (ALPHAGAN) 0.2 % ophthalmic solution Place 1 drop into the right eye 2 (two) times daily.   Yes [provider]  clopidogrel (PLAVIX) 75 MG tablet Take 1  tablet (75 mg total) by mouth daily. 11/01/18  Yes Mariel Aloe, MD  cycloSPORINE (RESTASIS) 0.05 % ophthalmic emulsion Place 1 drop into both eyes 2 (two) times daily.   Yes [provider]  dorzolamidel-timolol (COSOPT) 22.3-6.8 MG/ML SOLN ophthalmic solution Place 1 drop into the right eye 2 (two) times daily.   Yes [provider]  gabapentin (NEURONTIN) 300 MG capsule Take 1 capsule (300 mg total) by mouth at bedtime. Patient taking differently: Take 300 mg by mouth 3 (three) times daily. 08/11/20  Yes Newt Minion, MD  ketorolac (ACULAR) 0.5 % ophthalmic solution Place 1 drop into both eyes daily.   Yes [provider]  Dorzolamide HCl-Timolol Mal PF 2-0.5 % SOLN Place 1 drop into both eyes 2 (two) times daily. Patient not taking: No sig reported 03/28/20   Aline August, MD  hydroxypropyl methylcellulose / hypromellose (ISOPTO TEARS / GONIOVISC) 2.5 % ophthalmic solution Place 1 drop into both eyes 3 (three) times daily as needed for dry eyes. Patient not taking: No sig reported 09/01/20   Valarie Merino, MD  traMADol (ULTRAM) 50 MG  tablet Take 1 tablet (50 mg total) by mouth every 6 (six) hours as needed for moderate pain. Patient not taking: No sig reported 03/28/20   Persons, Bevely Palmer, [place]    Allergies    Patient has no known allergies.  Review of Systems   Review of Systems  10 systems reviewed and negative except as per HPI Physical Exam Updated Vital Signs BP (!) 104/58   Pulse (!) 50   Temp 98.7 F (37.1 C) (Oral)   Resp 18   Ht 6\' 1"  (1.854 m)   Wt 79.4 kg   SpO2 100%   BMI 23.09 kg/m   Physical Exam Constitutional:      Comments: Patient is alert.  No acute distress.  No respiratory distress  HENT:     Head: Normocephalic and atraumatic.     Nose: Nose normal.     Mouth/Throat:     Mouth: Mucous membranes are moist.     Pharynx: Oropharynx is clear.  Eyes:     Comments: Patient has arcus senilis bilaterally.  Pupils are symmetric and approximately 3 millimeters. Tono-Pen used to measure pressures  Right eye: 14, 10, 13 Left eye: 12: 10: 13 Red reflex present in both eyes.  Difficult posterior exam no obvious appearance of retinal detachment.  Cardiovascular:     Rate and Rhythm: Normal rate and regular rhythm.  Pulmonary:     Effort: Pulmonary effort is normal.     Breath sounds: Normal breath sounds.  Abdominal:     General: There is no distension.     Palpations: Abdomen is soft.     Tenderness: There is no abdominal tenderness. There is no guarding.  Musculoskeletal:        General: No swelling or tenderness. Normal range of motion.     Cervical back: Neck supple.     Right lower leg: No edema.     Left lower leg: No edema.  Skin:    General: Skin is warm and dry.  Neurological:     Comments: Patient is alert.  No receptive or expressive aphasia.  Patient does seem to have some recall deficits more suggestive of mild dementia.  5\5 grip strength bilateral upper extremities.  Patient and independently hold each lower extremity off of the bed and resist downward pressure.   Psychiatric:  Mood and Affect: Mood normal.     ED Results / Procedures / Treatments   Labs (all labs ordered are listed, but only abnormal results are displayed) Labs Reviewed - No data to display  EKG None  Radiology No results found.  Procedures Procedures   Medications Ordered in ED Medications  tetracaine (PONTOCAINE) 0.5 % ophthalmic solution 2 drop (has no administration in time range)    ED Course  I have reviewed the triage vital signs and the nursing notes.  Pertinent labs & imaging results that were available during my care of the patient were reviewed by me and considered in my medical decision making (see chart for details).    MDM Rules/Calculators/A&P                          At this time, patient's intraocular pressures are controlled without any evidence of an acute glaucoma flare.  Patient is formally discharged from the [facility] glaucoma clinic and will be following up with Dr. Carolynn Sayers.  Is very unclear when and what change took place in the patient's vision.  His niece describes this as a chronic complaint and sometimes he is more concerned than others and at that time the go for an evaluation.  It is difficult to assess patient's baseline vision at this time.  He vacillates back and forth between describing the symptoms in either the right or the left eye.  There are no other stroke symptoms.  At this time, will recommend close follow-up with ophthalmology for recheck.  I also reviewed with the patient and his niece to return immediately should any other stroke symptoms appear.  We discussed the possibility of an MRI however his niece did feel that these symptoms have been very longstanding and nothing has been observed that would suggest the patient had acutely had a stroke. Final Clinical Impression(s) / ED Diagnoses Final diagnoses:  Vision disturbance    Rx / DC Orders ED Discharge Orders    None       Charlesetta Shanks, MD 11/04/20 [phone]

## 2020-11-04 NOTE — ED Triage Notes (Addendum)
Patient reports he is in ED due to vision going white. Says it started a few weeks ago. Denies pain. niece with patient, states patient comes all the time to ED and that patient has glaucoma. niece states patient is just in ED because patient is depressed.

## 2020-11-17 ENCOUNTER — Telehealth: Payer: Self-pay | Admitting: Orthopedic Surgery

## 2020-11-17 NOTE — Telephone Encounter (Signed)
This was the pt we talked about, pts niece just wants to make sure she doesn't need to do any special wrapping for the pt until his appt on 11/20/20.   [phone]

## 2020-11-17 NOTE — Telephone Encounter (Signed)
I called and sw pt's niece and she advised that she though there may be an infection. I offered this morning for the pt to come in and there were transportation issues. I advised we could see him this afternoon and she was not able to bring him. Pt has an appt on Monday advised to keep the area clean with soap and water and covered with a bandage and to do this daily. Will keep appt and monitor for any other changes.

## 2020-11-20 ENCOUNTER — Other Ambulatory Visit: Payer: Self-pay | Admitting: Physician Assistant

## 2020-11-20 ENCOUNTER — Ambulatory Visit: Payer: Self-pay

## 2020-11-20 ENCOUNTER — Encounter: Payer: Self-pay | Admitting: Orthopedic Surgery

## 2020-11-20 ENCOUNTER — Ambulatory Visit: Payer: Medicare Other | Admitting: Orthopaedic Surgery

## 2020-11-20 ENCOUNTER — Ambulatory Visit (INDEPENDENT_AMBULATORY_CARE_PROVIDER_SITE_OTHER): Payer: Medicare Other | Admitting: Orthopedic Surgery

## 2020-11-20 DIAGNOSIS — M79672 Pain in left foot: Secondary | ICD-10-CM | POA: Diagnosis not present

## 2020-11-20 DIAGNOSIS — Z89431 Acquired absence of right foot: Secondary | ICD-10-CM

## 2020-11-20 DIAGNOSIS — M86272 Subacute osteomyelitis, left ankle and foot: Secondary | ICD-10-CM | POA: Diagnosis not present

## 2020-11-20 NOTE — Progress Notes (Signed)
Office Visit Note   Patient: [name]           Date of Birth: 11/30/1940           MRN: [id number] Visit Date: 11/20/2020              Requested by: [name], MD [address] [place],  [place] [address] PCP: [name], MD  Chief Complaint  Patient presents with  . Right Foot - Pain  . Left Foot - Pain      HPI: Patient is a 80 year old gentleman who is seen for follow-up for both feet.  Patient has a stable right transmetatarsal amputation with progressive ulceration drainage on the plantar aspect of the left Chopart's amputation.  Patient states he has some pain and burning.  He complains of purulent drainage from the left foot.  Assessment & Plan: Visit Diagnoses:  1. Pain in left foot   2. History of Chopart amputation of right foot ([place])   3. Subacute osteomyelitis, left ankle and foot ([place])   4. History of transmetatarsal amputation of right foot ([place])     Plan: We will plan for a left transtibial amputation.  With the ulceration involving the hindfoot patient does not have foot reconstruction options.  We will plan for a transtibial amputation Wednesday.  Follow-Up Instructions: Return in about 2 weeks (around 12/04/2020).   Ortho Exam  Patient is alert, oriented, no adenopathy, well-dressed, normal affect, normal respiratory effort. Examination patient has a large necrotic ulcer, There is purulent drainage.  . After informed consent a 10 blade knife was used to debride back to skin and soft tissue.  After debridement the ulcer is 3 cm in diameter 3 cm deep with exposed bone, prior to debridement was 1 cm diameter 1 cm deep.   Silver nitrate was used hemostasis and a sterile dressing was applied.  Imaging: No results found. No images are attached to the encounter.  Labs: Lab Results  Component Value Date   HGBA1C 5.7 (H) 03/21/2020   HGBA1C 5.4 11/01/2018   HGBA1C 5.2 05/29/2018   ESRSEDRATE 50 (H) 03/20/2020    ESRSEDRATE 50 (H) 03/17/2020   ESRSEDRATE 34 (H) 12/17/2017   CRP 13.5 (H) 03/25/2020   CRP 0.8 03/17/2020   CRP 17.1 (H) 12/17/2017   REPTSTATUS 03/25/2020 FINAL 03/19/2020   REPTSTATUS 03/25/2020 FINAL 03/19/2020   REPTSTATUS 03/21/2020 FINAL 03/19/2020   GRAMSTAIN  03/17/2020    FEW WBC PRESENT, PREDOMINANTLY PMN FEW GRAM POSITIVE COCCI    CULT  03/19/2020    NO GROWTH 5 DAYS Performed at [facility], [place] [address], [place], [place] [address]    CULT  03/19/2020    NO GROWTH 5 DAYS Performed at [facility] [address], [place], [place] [address]    CULT (A) 03/19/2020    <10,000 COLONIES/mL INSIGNIFICANT GROWTH Performed at [facility] [address] [place], [place] [address]    LABORGA METHICILLIN RESISTANT STAPHYLOCOCCUS AUREUS 03/17/2020     Lab Results  Component Value Date   ALBUMIN 3.5 03/19/2020   ALBUMIN 3.7 02/05/2020   ALBUMIN 3.2 (L) 10/31/2018    Lab Results  Component Value Date   MG 1.9 03/27/2020   MG 1.6 (L) 03/26/2020   MG 1.4 (L) 03/25/2020   No results found for: VD25OH  No results found for: PREALBUMIN CBC EXTENDED Latest Ref Rng & Units 03/27/2020 03/26/2020 03/25/2020  WBC 4.0 - 10.5 K/uL 8.9  8.5 8.2  RBC 4.22 - 5.81 MIL/uL 3.36(L) 3.57(L) 2.99(L)  HGB 13.0 - 17.0 g/dL 9.6(L) 10.1(L) 8.3(L)  HCT 39.0 - 52.0 % 29.5(L) 31.2(L) 26.0(L)  PLT 150 - 400 K/uL 280 218 203  NEUTROABS 1.7 - 7.7 K/uL 5.4 5.3 5.3  LYMPHSABS 0.7 - 4.0 K/uL 1.4 1.2 1.2     There is no height or weight on file to calculate BMI.  Orders:  Orders Placed This Encounter  Procedures  . XR Foot Complete Left  . Type and screen   No orders of the defined types were placed in this encounter.    Procedures: No procedures performed  Clinical Data: No additional findings.  ROS:  All other systems negative, except as noted in the HPI. Review of Systems  Objective: Vital Signs: There were no vitals taken for this  visit.  Specialty Comments:  No specialty comments available.  PMFS History: Patient Active Problem List   Diagnosis Date Noted  . 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 Acute encephalopathy 03/20/2020  . SIRS (systemic inflammatory response syndrome) ([place]) 03/20/2020  . 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[place]) 03/20/2020  . ARF (acute renal failure) ([place]) 03/20/2020  . Midfoot ulcer, left, limited to breakdown of skin ([place]) 12/29/2018  . Occipital stroke ([place]) 10/31/2018  . DM2 (diabetes mellitus, type 2) ([place]) 10/31/2018  . NSTEMI (non-ST elevated myocardial infarction) ([place]) 05/28/2018  . Syncope 05/28/2018  . HTN (hypertension) 05/28/2018  . Hyperlipemia, mixed 05/28/2018  . Rash and nonspecific skin eruption 12/16/2017  . Pain in right hand 09/11/2017  . Carpal tunnel syndrome, right upper limb 07/01/2017  . Arthritis of carpometacarpal [place] [facility]) joint of left thumb 12/30/2016  . Midfoot skin ulcer, right, limited to breakdown of skin ([place]) 07/22/2016  . 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 Osteomyelitis of ankle or foot, left, acute ([place]) 08/23/2014  . Cellulitis 10/12/2012  . Chest pain 10/12/2012  . Tobacco abuse 10/12/2012  . ABSCESS, AXILLA, LEFT 02/23/2008  . 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 CHEST WALL PAIN, ACUTE 12/03/2007  . CANDIDIASIS, GLANS PENIS 09/16/2007  . HEADACHE 08/04/2007  . 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 Other and unspecified hyperlipidemia 03/05/2007  . GOUT 03/05/2007  . ERECTILE DYSFUNCTION 03/05/2007  . EXTERNAL HEMORRHOIDS 03/05/2007  . VENTRAL HERNIA 03/05/2007  . FATTY LIVER DISEASE 03/05/2007  . Osteoarthritis 03/05/2007  . HEMORRHOIDS, INTERNAL 03/17/2005  . COLONIC POLYPS, HX OF 03/17/2005   Past Medical History:  Diagnosis Date  . Arthritis   . Diabetic neuropathy ([place])   . High cholesterol    . Hypertension    denies  . Osteomyelitis (HCC)    Right great toe  . Peripheral vascular disease ([place])    diabetic  with osteomylitis  . Stroke (cerebrum) ([place]) 10/2018  . Type II diabetes mellitus (HCC)     Family History  Problem Relation Age of Onset  . Diabetes Mellitus II Other   . Anesthesia problems Neg Hx     Past Surgical History:  Procedure Laterality Date  . AMPUTATION  07/23/2011   Procedure: AMPUTATION DIGIT;  Surgeon: Newt Minion, MD;  Location: [place];  Service: Orthopedics;  Laterality: Right;  Right Great Toe Amputation  . AMPUTATION Right 09/18/2012   Procedure: Right Foot 2nd Ray Amputation;  Surgeon: Newt Minion, MD;  Location: [place];  Service: Orthopedics;  Laterality: Right;  Right Foot 2nd Ray Amputation  . AMPUTATION Right 10/14/2012   Procedure: AMPUTATION FOOT;  Surgeon: Newt Minion, MD;  Location: [facility] [place];  Service: Orthopedics;  Laterality: Right;  Right Foot Transmetatarsal Amputation  . AMPUTATION Left 09/23/2014   Procedure: Left Foot Transmetatarsal Amputation;  Surgeon: Newt Minion, MD;  Location: [place];  Service: Orthopedics;  Laterality: Left;  . BACK SURGERY     lower  . CARPAL TUNNEL RELEASE Right 01/30/2018   Procedure: RIGHT CARPAL TUNNEL RELEASE;  Surgeon: Newt Minion, MD;  Location: [place];  Service: Orthopedics;  Laterality: Right;  . CIRCUMCISION  2010  . COLONOSCOPY    . FOOT FRACTURE SURGERY Left 1970's   "broke it playing football" (10/12/2012)  . HUMERUS FRACTURE SURGERY W/ IMPLANT Right 1960's   "put a plate in it" (5/97/4718)  . I & D EXTREMITY Right 03/24/2020   Procedure: EXCISION RIGHT MEDIAL CUNEFORM;  Surgeon: Newt Minion, MD;  Location: [place];  Service: Orthopedics;  Laterality: Right;  . INCISION AND DRAINAGE OF WOUND Left 2006   "foot" (10/12/2012)  . LUMBAR [place] SURGERY  2008   Social History   Occupational History  . Not on file  Tobacco Use  . Smoking status: Current Every Day Smoker    Years: 12.00    Types:  Pipe  . Smokeless tobacco: Never Used  Vaping Use  . Vaping Use: Never used  Substance and Sexual Activity  . Alcohol use: No    Alcohol/week: 0.0 standard drinks  . Drug use: No  . Sexual activity: Not Currently

## 2020-11-21 ENCOUNTER — Encounter (HOSPITAL_COMMUNITY): Payer: Self-pay | Admitting: Orthopedic Surgery

## 2020-11-21 ENCOUNTER — Other Ambulatory Visit: Payer: Self-pay

## 2020-11-21 NOTE — Progress Notes (Addendum)
Mr. [name] denies chest pain or shortness of breath. Patient denies having any s/s of Covid in her household.  Patient denies any known exposure to Covid. Mr. [name] will have a Covid test on arrival in am.  Mr. [name] has type II diabetes, patient states that he takes Metformin .  Metformin is not listed on Medication list. I called [facility] to find out the dose is, I was told that the medication have been d/c'ed.  Mr. [name] much of the call, patient said he does not have HTN and is up set that is is his record.    Mr. [name] that his niece manages medicine and she checks CBG at times.  Niece is  [name] and she is a Education officer, museum, Mr. [name] stated that I may speak to [name] later in the day.  I spoke to [place], Mr. [name]'s niece.  [name] reports that Mr. [name] may not be taking any of his medications, "he throws them out the door".   [name] and her sister are Mr. [name] POA.  I asked  [name] to bring Medical Power of [place] and Living Will in am.  [name] reports that she hopes Mr. [name] can be sent to [facility] after surgery, for rehab.

## 2020-11-21 NOTE — Addendum Note (Signed)
Addended by: Georgette Dover on: 11/21/2020 05:45 AM   Modules accepted: Orders

## 2020-11-22 ENCOUNTER — Inpatient Hospital Stay (HOSPITAL_COMMUNITY): Payer: Medicare Other | Admitting: Certified Registered"

## 2020-11-22 ENCOUNTER — Encounter (HOSPITAL_COMMUNITY): Payer: Self-pay | Admitting: Orthopedic Surgery

## 2020-11-22 ENCOUNTER — Inpatient Hospital Stay (HOSPITAL_COMMUNITY)
Admission: RE | Admit: 2020-11-22 | Discharge: 2020-11-23 | DRG: 617 | Disposition: A | Discharge status: Home Health | Payer: Medicare Other | Attending: Orthopedic Surgery | Admitting: Orthopedic Surgery

## 2020-11-22 ENCOUNTER — Inpatient Hospital Stay (HOSPITAL_COMMUNITY)
Admission: RE | Disposition: A | Discharge status: Home Health | Payer: Self-pay | Source: Home / Self Care | Attending: Orthopedic Surgery

## 2020-11-22 DIAGNOSIS — I10 Essential (primary) hypertension: Secondary | ICD-10-CM | POA: Diagnosis present

## 2020-11-22 DIAGNOSIS — E1169 Type 2 diabetes mellitus with other specified complication: Secondary | ICD-10-CM | POA: Diagnosis not present

## 2020-11-22 DIAGNOSIS — E1151 Type 2 diabetes mellitus with diabetic peripheral angiopathy without gangrene: Secondary | ICD-10-CM

## 2020-11-22 DIAGNOSIS — E7800 Pure hypercholesterolemia, unspecified: Secondary | ICD-10-CM | POA: Diagnosis present

## 2020-11-22 DIAGNOSIS — F17290 Nicotine dependence, other tobacco product, uncomplicated: Secondary | ICD-10-CM | POA: Diagnosis not present

## 2020-11-22 DIAGNOSIS — M86272 Subacute osteomyelitis, left ankle and foot: Secondary | ICD-10-CM | POA: Diagnosis present

## 2020-11-22 DIAGNOSIS — E782 Mixed hyperlipidemia: Secondary | ICD-10-CM | POA: Diagnosis not present

## 2020-11-22 DIAGNOSIS — Z8673 Personal history of transient ischemic attack (TIA), and cerebral infarction without residual deficits: Secondary | ICD-10-CM

## 2020-11-22 DIAGNOSIS — R6889 Other general symptoms and signs: Secondary | ICD-10-CM | POA: Diagnosis not present

## 2020-11-22 DIAGNOSIS — Z89421 Acquired absence of other right toe(s): Secondary | ICD-10-CM

## 2020-11-22 DIAGNOSIS — I214 Non-ST elevation (NSTEMI) myocardial infarction: Secondary | ICD-10-CM | POA: Diagnosis not present

## 2020-11-22 DIAGNOSIS — L97929 Non-pressure chronic ulcer of unspecified part of left lower leg with unspecified severity: Secondary | ICD-10-CM | POA: Diagnosis not present

## 2020-11-22 DIAGNOSIS — M869 Osteomyelitis, unspecified: Secondary | ICD-10-CM | POA: Diagnosis not present

## 2020-11-22 DIAGNOSIS — Z20822 Contact with and (suspected) exposure to covid-19: Secondary | ICD-10-CM | POA: Diagnosis present

## 2020-11-22 DIAGNOSIS — Z89411 Acquired absence of right great toe: Secondary | ICD-10-CM

## 2020-11-22 DIAGNOSIS — Z89422 Acquired absence of other left toe(s): Secondary | ICD-10-CM

## 2020-11-22 DIAGNOSIS — M71072 Abscess of bursa, left ankle and foot: Secondary | ICD-10-CM | POA: Diagnosis present

## 2020-11-22 DIAGNOSIS — I70248 Atherosclerosis of native arteries of left leg with ulceration of other part of lower left leg: Secondary | ICD-10-CM | POA: Diagnosis not present

## 2020-11-22 DIAGNOSIS — I70209 Unspecified atherosclerosis of native arteries of extremities, unspecified extremity: Secondary | ICD-10-CM

## 2020-11-22 DIAGNOSIS — H409 Unspecified glaucoma: Secondary | ICD-10-CM | POA: Diagnosis not present

## 2020-11-22 DIAGNOSIS — Z833 Family history of diabetes mellitus: Secondary | ICD-10-CM

## 2020-11-22 DIAGNOSIS — Z743 Need for continuous supervision: Secondary | ICD-10-CM | POA: Diagnosis not present

## 2020-11-22 HISTORY — PX: AMPUTATION: SHX166

## 2020-11-22 HISTORY — DX: Prediabetes: R73.03

## 2020-11-22 HISTORY — DX: Unspecified glaucoma: H40.9

## 2020-11-22 LAB — BASIC METABOLIC PANEL
Anion gap: 9 (ref 5–15)
BUN: 11 mg/dL (ref 8–23)
CO2: 24 mmol/L (ref 22–32)
Calcium: 8.9 mg/dL (ref 8.9–10.3)
Chloride: 102 mmol/L (ref 98–111)
Creatinine, Ser: 1.09 mg/dL (ref 0.61–1.24)
GFR, Estimated: >60 mL/min (ref >60)
Glucose, Bld: 95 mg/dL (ref 70–99)
Potassium: 3.4 mmol/L — ABNORMAL LOW (ref 3.5–5.1)
Sodium: 135 mmol/L (ref 135–145)

## 2020-11-22 LAB — GLUCOSE, CAPILLARY
Glucose-Capillary: 91 mg/dL (ref 70–99)
Glucose-Capillary: 83 mg/dL (ref 70–99)
Glucose-Capillary: 90 mg/dL (ref 70–99)
Glucose-Capillary: 138 mg/dL — ABNORMAL HIGH (ref 70–99)
Glucose-Capillary: 164 mg/dL — ABNORMAL HIGH (ref 70–99)

## 2020-11-22 LAB — CBC
HCT: 33 % — ABNORMAL LOW (ref 39.0–52.0)
Hemoglobin: 10.7 g/dL — ABNORMAL LOW (ref 13.0–17.0)
MCH: 28.4 pg (ref 26.0–34.0)
MCHC: 32.4 g/dL (ref 30.0–36.0)
MCV: 87.5 fL (ref 80.0–100.0)
Platelets: 292 10*3/uL (ref 150–400)
RBC: 3.77 MIL/uL — ABNORMAL LOW (ref 4.22–5.81)
RDW: 14.6 % (ref 11.5–15.5)
WBC: 8.3 10*3/uL (ref 4.0–10.5)
nRBC: 0 % (ref 0.0–0.2)

## 2020-11-22 LAB — TYPE AND SCREEN
ABO/RH(D): A POS
Antibody Screen: NEGATIVE

## 2020-11-22 LAB — SARS CORONAVIRUS 2 BY RT PCR (HOSPITAL ORDER, PERFORMED IN ~~LOC~~ HOSPITAL LAB): SARS Coronavirus 2: NEGATIVE

## 2020-11-22 LAB — ABO/RH: ABO/RH(D): A POS

## 2020-11-22 SURGERY — AMPUTATION BELOW KNEE
Anesthesia: Regional | Site: Knee | Laterality: Left

## 2020-11-22 MED ORDER — PHENOL 1.4 % MT LIQD: 1.0000 | OROMUCOSAL | Status: DC | PRN

## 2020-11-22 MED ORDER — ACETAMINOPHEN 160 MG/5ML PO SOLN: 325.0000 mg | ORAL | Status: DC | PRN

## 2020-11-22 MED ORDER — OXYCODONE HCL 5 MG/5ML PO SOLN: 5.0000 mg | Freq: Once | ORAL | Status: DC | PRN

## 2020-11-22 MED ORDER — ONDANSETRON HCL 4 MG/2ML IJ SOLN: 4.0000 mg | Freq: Four times a day (QID) | INTRAMUSCULAR | Status: DC | PRN

## 2020-11-22 MED ORDER — ALUM & MAG HYDROXIDE-SIMETH 200-200-20 MG/5ML PO SUSP: 15.0000 mL | ORAL | Status: DC | PRN

## 2020-11-22 MED ORDER — HYDROMORPHONE HCL 1 MG/ML IJ SOLN: 0.5000 mg | INTRAMUSCULAR | Status: DC | PRN

## 2020-11-22 MED ORDER — LACTATED RINGERS IV SOLN: INTRAVENOUS | Status: DC

## 2020-11-22 MED ORDER — FENTANYL CITRATE (PF) 100 MCG/2ML IJ SOLN
25.0000 ug | Freq: Once | INTRAMUSCULAR | Status: AC
Start: 2020-11-22 — End: 2020-11-22
  Filled 2020-11-22: qty 0.5

## 2020-11-22 MED ORDER — POLYETHYLENE GLYCOL 3350 17 G PO PACK: 17.0000 g | PACK | Freq: Every day | ORAL | Status: DC | PRN

## 2020-11-22 MED ORDER — AMISULPRIDE (ANTIEMETIC) 5 MG/2ML IV SOLN: 10.0000 mg | Freq: Once | INTRAVENOUS | Status: DC | PRN

## 2020-11-22 MED ORDER — PROMETHAZINE HCL 25 MG/ML IJ SOLN: 6.2500 mg | INTRAMUSCULAR | Status: DC | PRN

## 2020-11-22 MED ORDER — PROPOFOL 500 MG/50ML IV EMUL
INTRAVENOUS | Status: DC | PRN
  Administered 2020-11-22: 75 ug/kg/min via INTRAVENOUS

## 2020-11-22 MED ORDER — DEXAMETHASONE SODIUM PHOSPHATE 10 MG/ML IJ SOLN
INTRAMUSCULAR | Status: AC
  Filled 2020-11-22: qty 1

## 2020-11-22 MED ORDER — ONDANSETRON HCL 4 MG/2ML IJ SOLN
INTRAMUSCULAR | Status: AC
  Filled 2020-11-22: qty 2

## 2020-11-22 MED ORDER — POTASSIUM CHLORIDE CRYS ER 20 MEQ PO TBCR: 20.0000 meq | EXTENDED_RELEASE_TABLET | Freq: Every day | ORAL | Status: DC | PRN

## 2020-11-22 MED ORDER — DOCUSATE SODIUM 100 MG PO CAPS
100.0000 mg | ORAL_CAPSULE | Freq: Every day | ORAL | Status: DC
  Administered 2020-11-23: 100 mg via ORAL
  Filled 2020-11-22: qty 1

## 2020-11-22 MED ORDER — MIDAZOLAM HCL 2 MG/2ML IJ SOLN
INTRAMUSCULAR | Status: AC
  Administered 2020-11-22: 1 mg via INTRAVENOUS
  Filled 2020-11-22: qty 2

## 2020-11-22 MED ORDER — OXYCODONE HCL 5 MG PO TABS: 5.0000 mg | ORAL_TABLET | Freq: Once | ORAL | Status: DC | PRN

## 2020-11-22 MED ORDER — ZINC SULFATE 220 (50 ZN) MG PO CAPS
220.0000 mg | ORAL_CAPSULE | Freq: Every day | ORAL | Status: DC
  Administered 2020-11-23: 220 mg via ORAL
  Filled 2020-11-22: qty 1

## 2020-11-22 MED ORDER — SODIUM CHLORIDE 0.9 % IV SOLN: INTRAVENOUS | Status: DC

## 2020-11-22 MED ORDER — FENTANYL CITRATE (PF) 100 MCG/2ML IJ SOLN
INTRAMUSCULAR | Status: AC
  Administered 2020-11-22: 25 ug via INTRAVENOUS
  Filled 2020-11-22: qty 2

## 2020-11-22 MED ORDER — BUPIVACAINE HCL (PF) 0.5 % IJ SOLN
INTRAMUSCULAR | Status: DC | PRN
  Administered 2020-11-22: 20 mL

## 2020-11-22 MED ORDER — JUVEN PO PACK
1.0000 | PACK | Freq: Two times a day (BID) | ORAL | Status: DC
  Administered 2020-11-22 – 2020-11-23 (×3): 1 via ORAL
  Filled 2020-11-22 (×3): qty 1

## 2020-11-22 MED ORDER — CYCLOSPORINE 0.05 % OP EMUL
1.0000 [drp] | Freq: Two times a day (BID) | OPHTHALMIC | Status: DC
  Administered 2020-11-22 – 2020-11-23 (×2): 1 [drp] via OPHTHALMIC
  Filled 2020-11-22 (×3): qty 1

## 2020-11-22 MED ORDER — BISACODYL 5 MG PO TBEC: 5.0000 mg | DELAYED_RELEASE_TABLET | Freq: Every day | ORAL | Status: DC | PRN

## 2020-11-22 MED ORDER — ACETAMINOPHEN 325 MG PO TABS
325.0000 mg | ORAL_TABLET | Freq: Four times a day (QID) | ORAL | Status: DC | PRN
  Administered 2020-11-23: 650 mg via ORAL
  Filled 2020-11-22: qty 2

## 2020-11-22 MED ORDER — FENTANYL CITRATE (PF) 100 MCG/2ML IJ SOLN: 25.0000 ug | INTRAMUSCULAR | Status: DC | PRN

## 2020-11-22 MED ORDER — GUAIFENESIN-DM 100-10 MG/5ML PO SYRP: 15.0000 mL | ORAL_SOLUTION | ORAL | Status: DC | PRN

## 2020-11-22 MED ORDER — DORZOLAMIDE HCL-TIMOLOL MAL PF 22.3-6.8 MG/ML OP SOLN: 1.0000 [drp] | Freq: Two times a day (BID) | OPHTHALMIC | Status: DC

## 2020-11-22 MED ORDER — VANCOMYCIN HCL 1000 MG/200ML IV SOLN: 1000.0000 mg | Freq: Once | INTRAVENOUS | Status: DC

## 2020-11-22 MED ORDER — PANTOPRAZOLE SODIUM 40 MG PO TBEC
40.0000 mg | DELAYED_RELEASE_TABLET | Freq: Every day | ORAL | Status: DC
  Administered 2020-11-23: 40 mg via ORAL
  Filled 2020-11-22: qty 1

## 2020-11-22 MED ORDER — CEFAZOLIN SODIUM-DEXTROSE 2-4 GM/100ML-% IV SOLN
2.0000 g | Freq: Three times a day (TID) | INTRAVENOUS | Status: AC
  Administered 2020-11-22 – 2020-11-23 (×2): 2 g via INTRAVENOUS
  Filled 2020-11-22 (×2): qty 100

## 2020-11-22 MED ORDER — ACETAMINOPHEN 10 MG/ML IV SOLN: 1000.0000 mg | Freq: Once | INTRAVENOUS | Status: DC | PRN

## 2020-11-22 MED ORDER — ACETAMINOPHEN 325 MG PO TABS
325.0000 mg | ORAL_TABLET | ORAL | Status: DC | PRN
Start: 2020-11-22 — End: 2020-11-22

## 2020-11-22 MED ORDER — OXYCODONE HCL 5 MG PO TABS
5.0000 mg | ORAL_TABLET | ORAL | Status: DC | PRN
  Administered 2020-11-23: 10 mg via ORAL
  Filled 2020-11-22: qty 2

## 2020-11-22 MED ORDER — BUPIVACAINE LIPOSOME 1.3 % IJ SUSP
INTRAMUSCULAR | Status: DC | PRN
  Administered 2020-11-22: 10 mL via PERINEURAL

## 2020-11-22 MED ORDER — CEFAZOLIN SODIUM-DEXTROSE 2-4 GM/100ML-% IV SOLN
INTRAVENOUS | Status: AC
  Filled 2020-11-22: qty 100

## 2020-11-22 MED ORDER — ASCORBIC ACID 500 MG PO TABS
1000.0000 mg | ORAL_TABLET | Freq: Every day | ORAL | Status: DC
  Administered 2020-11-23: 1000 mg via ORAL
  Filled 2020-11-22: qty 2

## 2020-11-22 MED ORDER — GABAPENTIN 300 MG PO CAPS
300.0000 mg | ORAL_CAPSULE | Freq: Three times a day (TID) | ORAL | Status: DC
  Administered 2020-11-22 – 2020-11-23 (×4): 300 mg via ORAL
  Filled 2020-11-22 (×4): qty 1

## 2020-11-22 MED ORDER — MAGNESIUM CITRATE PO SOLN: 1.0000 | Freq: Once | ORAL | Status: DC | PRN

## 2020-11-22 MED ORDER — DEXAMETHASONE SODIUM PHOSPHATE 10 MG/ML IJ SOLN
INTRAMUSCULAR | Status: DC | PRN
  Administered 2020-11-22: 5 mg via INTRAVENOUS

## 2020-11-22 MED ORDER — DORZOLAMIDE HCL 2 % OP SOLN
1.0000 [drp] | Freq: Two times a day (BID) | OPHTHALMIC | Status: DC
  Administered 2020-11-22: 1 [drp] via OPHTHALMIC
  Filled 2020-11-22: qty 10

## 2020-11-22 MED ORDER — CEFAZOLIN SODIUM-DEXTROSE 2-3 GM-%(50ML) IV SOLR
INTRAVENOUS | Status: DC | PRN
  Administered 2020-11-22: 2 g via INTRAVENOUS

## 2020-11-22 MED ORDER — MAGNESIUM SULFATE 2 GM/50ML IV SOLN
2.0000 g | Freq: Every day | INTRAVENOUS | Status: DC | PRN
  Filled 2020-11-22: qty 50

## 2020-11-22 MED ORDER — ORAL CARE MOUTH RINSE: 15.0000 mL | Freq: Once | OROMUCOSAL | Status: AC

## 2020-11-22 MED ORDER — ONDANSETRON HCL 4 MG/2ML IJ SOLN
INTRAMUSCULAR | Status: DC | PRN
  Administered 2020-11-22: 4 mg via INTRAVENOUS

## 2020-11-22 MED ORDER — ATORVASTATIN CALCIUM 80 MG PO TABS
80.0000 mg | ORAL_TABLET | Freq: Every day | ORAL | Status: DC
  Administered 2020-11-23: 80 mg via ORAL
  Filled 2020-11-22: qty 1

## 2020-11-22 MED ORDER — TRANEXAMIC ACID-NACL 1000-0.7 MG/100ML-% IV SOLN
1000.0000 mg | Freq: Once | INTRAVENOUS | Status: AC
  Administered 2020-11-22: 1000 mg via INTRAVENOUS
  Filled 2020-11-22: qty 100

## 2020-11-22 MED ORDER — CHLORHEXIDINE GLUCONATE 0.12 % MT SOLN: 15.0000 mL | Freq: Once | OROMUCOSAL | Status: AC

## 2020-11-22 MED ORDER — ROPIVACAINE HCL 7.5 MG/ML IJ SOLN
INTRAMUSCULAR | Status: DC | PRN
  Administered 2020-11-22: 10 mL via PERINEURAL

## 2020-11-22 MED ORDER — KETOROLAC TROMETHAMINE 0.5 % OP SOLN
1.0000 [drp] | Freq: Every day | OPHTHALMIC | Status: DC
  Administered 2020-11-23: 1 [drp] via OPHTHALMIC
  Filled 2020-11-22: qty 5

## 2020-11-22 MED ORDER — BRIMONIDINE TARTRATE 0.2 % OP SOLN
1.0000 [drp] | Freq: Two times a day (BID) | OPHTHALMIC | Status: DC
  Administered 2020-11-22 – 2020-11-23 (×2): 1 [drp] via OPHTHALMIC
  Filled 2020-11-22: qty 5

## 2020-11-22 MED ORDER — TIMOLOL MALEATE 0.5 % OP SOLN
1.0000 [drp] | Freq: Two times a day (BID) | OPHTHALMIC | Status: DC
  Administered 2020-11-22: 1 [drp] via OPHTHALMIC
  Filled 2020-11-22: qty 5

## 2020-11-22 MED ORDER — 0.9 % SODIUM CHLORIDE (POUR BTL) OPTIME
TOPICAL | Status: DC | PRN
  Administered 2020-11-22: 1000 mL

## 2020-11-22 MED ORDER — CHLORHEXIDINE GLUCONATE 0.12 % MT SOLN
OROMUCOSAL | Status: AC
  Administered 2020-11-22: 15 mL via OROMUCOSAL
  Filled 2020-11-22: qty 15

## 2020-11-22 MED ORDER — MIDAZOLAM HCL 2 MG/2ML IJ SOLN
1.0000 mg | Freq: Once | INTRAMUSCULAR | Status: AC
  Filled 2020-11-22: qty 1

## 2020-11-22 SURGICAL SUPPLY — 41 items
BLADE SAW RECIP 87.9 MT (BLADE) ×2 IMPLANT
BLADE SURG 21 STRL SS (BLADE) ×2 IMPLANT
BNDG COHESIVE 6X5 TAN STRL LF (GAUZE/BANDAGES/DRESSINGS) IMPLANT
CANISTER WOUND CARE 500ML ATS (WOUND CARE) ×2 IMPLANT
CANISTER WOUNDNEG PRESSURE 500 (CANNISTER) ×1 IMPLANT
COVER SURGICAL LIGHT HANDLE (MISCELLANEOUS) ×2 IMPLANT
COVER WAND RF STERILE (DRAPES) IMPLANT
CUFF TOURN SGL QUICK 34 (TOURNIQUET CUFF) ×2
CUFF TRNQT CYL 34X4.125X (TOURNIQUET CUFF) ×1 IMPLANT
DRAPE DERMATAC (DRAPES) ×2 IMPLANT
DRAPE INCISE IOBAN 66X45 STRL (DRAPES) ×2 IMPLANT
DRAPE U-SHAPE 47X51 STRL (DRAPES) ×2 IMPLANT
DRESSING PREVENA PLUS CUSTOM (GAUZE/BANDAGES/DRESSINGS) ×1 IMPLANT
DRSG PREVENA PLUS CUSTOM (GAUZE/BANDAGES/DRESSINGS) ×2
DURAPREP 26ML APPLICATOR (WOUND CARE) ×2 IMPLANT
ELECT REM PT RETURN 9FT ADLT (ELECTROSURGICAL) ×2
ELECTRODE REM PT RTRN 9FT ADLT (ELECTROSURGICAL) ×1 IMPLANT
GLOVE BIOGEL PI IND STRL 9 (GLOVE) ×1 IMPLANT
GLOVE BIOGEL PI INDICATOR 9 (GLOVE) ×1
GLOVE SURG ORTHO 9.0 STRL STRW (GLOVE) ×2 IMPLANT
GOWN STRL REUS W/ TWL XL LVL3 (GOWN DISPOSABLE) ×2 IMPLANT
GOWN STRL REUS W/TWL XL LVL3 (GOWN DISPOSABLE) ×4
KIT BASIN OR (CUSTOM PROCEDURE TRAY) ×2 IMPLANT
KIT TURNOVER KIT B (KITS) ×2 IMPLANT
MANIFOLD NEPTUNE II (INSTRUMENTS) ×2 IMPLANT
NS IRRIG 1000ML POUR BTL (IV SOLUTION) ×2 IMPLANT
PACK ORTHO EXTREMITY (CUSTOM PROCEDURE TRAY) ×2 IMPLANT
PAD ARMBOARD 7.5X6 YLW CONV (MISCELLANEOUS) ×2 IMPLANT
PREVENA RESTOR ARTHOFORM 46X30 (CANNISTER) ×3 IMPLANT
SPONGE LAP 18X18 RF (DISPOSABLE) IMPLANT
STAPLER VISISTAT 35W (STAPLE) IMPLANT
STOCKINETTE IMPERVIOUS LG (DRAPES) ×2 IMPLANT
SUT ETHILON 2 0 PSLX (SUTURE) IMPLANT
SUT SILK 2 0 (SUTURE) ×2
SUT SILK 2-0 18XBRD TIE 12 (SUTURE) ×1 IMPLANT
SUT VIC AB 1 CTX 27 (SUTURE) IMPLANT
SUT VIC AB 1 CTX 36 (SUTURE) ×6
SUT VIC AB 1 CTX36XBRD ANBCTR (SUTURE) IMPLANT
TOWEL GREEN STERILE (TOWEL DISPOSABLE) ×2 IMPLANT
TUBE CONNECTING 12X1/4 (SUCTIONS) ×2 IMPLANT
YANKAUER SUCT BULB TIP NO VENT (SUCTIONS) ×2 IMPLANT

## 2020-11-22 NOTE — Progress Notes (Signed)
Orthopedic Tech Progress Note Patient Details:  Brenan Modesto [birth year] [id number] Called in order to [facility] for a VIVE PROTOCOL  Patient ID: Jessup Ogas, male   DOB: 12/23/1940, 80 y.o.   MRN: [id number]   Janit Pagan 11/22/2020, 2:39 PM

## 2020-11-22 NOTE — Interval H&P Note (Signed)
History and Physical Interval Note:  11/22/2020 9:45 AM  [name]  has presented today for surgery, with the diagnosis of OSTOMYELITIS.  The various methods of treatment have been discussed with the patient and family. After consideration of risks, benefits and other options for treatment, the patient has consented to  Procedure(s): LEFT BELOW KNEE AMPUTATION (Left) as a surgical intervention.  The patient's history has been reviewed, patient examined, no change in status, stable for surgery.  I have reviewed the patient's chart and labs.  Questions were answered to the patient's satisfaction.     Newt Minion

## 2020-11-22 NOTE — H&P (Signed)
[name] is an 80 y.o. male.   Chief Complaint:Left Foot osteomyelitis HPI:  Patient is a 80 year old gentleman who is seen for follow-up for both feet.  Patient has a stable right transmetatarsal amputation with progressive ulceration drainage on the plantar aspect of the left Chopart's amputation.  Patient states he has some pain and burning.  He complains of purulent drainage from the left foot  Past Medical History:  Diagnosis Date  . Arthritis    Denies  . Glaucoma   . High cholesterol   . Hypertension    denies  . Osteomyelitis (HCC)    Right great toe  . Peripheral vascular disease ([place])    diabetic  with osteomylitis  . Pre-diabetes   . 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 AMPUTATION  07/23/2011   Procedure: AMPUTATION DIGIT;  Surgeon: Newt Minion, MD;  Location: [place];  Service: Orthopedics;  Laterality: Right;  Right Great Toe Amputation  . AMPUTATION Right 09/18/2012   Procedure: Right Foot 2nd Ray Amputation;  Surgeon: Newt Minion, MD;  Location: [facility];  Service: Orthopedics;  Laterality: Right;  Right Foot 2nd Ray Amputation  . AMPUTATION Right 10/14/2012   Procedure: AMPUTATION FOOT;  Surgeon: Newt Minion, MD;  Location: [facility];  Service: Orthopedics;  Laterality: Right;  Right Foot Transmetatarsal Amputation  . AMPUTATION Left 09/23/2014   Procedure: Left Foot Transmetatarsal Amputation;  Surgeon: Newt Minion, MD;  Location: [facility];  Service: Orthopedics;  Laterality: Left;  . BACK SURGERY     lower  . CARPAL TUNNEL RELEASE Right 01/30/2018   Procedure: RIGHT CARPAL TUNNEL RELEASE;  Surgeon: Newt Minion, MD;  Location: [facility];  Service: Orthopedics;  Laterality: Right;  . CIRCUMCISION  2010  . COLONOSCOPY    . FOOT FRACTURE SURGERY Left 1970's   "broke it playing football" (10/12/2012)  . HUMERUS FRACTURE SURGERY W/ IMPLANT Right 1960's   "put a plate in it" (5/64/3329)  . I & D EXTREMITY Right 03/24/2020    Procedure: EXCISION RIGHT MEDIAL CUNEFORM;  Surgeon: Newt Minion, MD;  Location: [facility];  Service: Orthopedics;  Laterality: Right;  . INCISION AND DRAINAGE OF WOUND Left 2006   "foot" (10/12/2012)  . [place] SURGERY  2008    Family History  Problem Relation Age of Onset  . Diabetes Mellitus II Other   . Anesthesia problems Neg Hx    Social History:  reports that he has been smoking pipe. He has smoked for the past 27.00 years. He has never used smokeless tobacco. He reports that he does not drink alcohol and does not use drugs.  Allergies: No Known Allergies  No medications prior to admission.    No results found for this or any previous visit (from the past 48 hour(s)). No results found.  Review of Systems  All other systems reviewed and are negative.   There were no vitals taken for this visit. Physical Exam  Patient is alert, oriented, no adenopathy, well-dressed, normal affect, normal respiratory effort. Examination patient has a large necrotic ulcer, There is purulent drainage.  . After informed consent a 10 blade knife was used to debride back to skin and soft tissue.  After debridement the ulcer is 3 cm in diameter 3 cm deep with exposed bone, prior to debridement was 1 cm diameter 1 cm deep.   Silver nitrate was used hemostasis and a sterile dressing was applied.Heart RRR  Lungs clear  Assessment/Plan 1. Pain in left foot   2. 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 Subacute osteomyelitis, left ankle and foot ([place])   4. History of transmetatarsal amputation of right foot ([place])     Plan: We will plan for a left transtibial amputation.  With the ulceration involving the hindfoot patient does not have foot reconstruction options.  We will plan for a transtibial amputation Wednesday.    Casey Palmer Brenae Lasecki, PA 11/22/2020, 6:32 AM

## 2020-11-22 NOTE — Anesthesia Procedure Notes (Signed)
Anesthesia Regional Block: Adductor canal block   Pre-Anesthetic Checklist: ,, timeout performed, Correct Patient, Correct Site, Correct Laterality, Correct Procedure, Correct Position, site marked, Risks and benefits discussed,  Surgical consent,  Pre-op evaluation,  At surgeon's request and post-op pain management  Laterality: Left  Prep: chloraprep       Needles:  Injection technique: Single-shot  Needle Type: Echogenic Stimulator Needle     Needle Length: 9cm  Needle Gauge: 21     Additional Needles:   Procedures:,,,, ultrasound used (permanent image in chart),,,,  Narrative:  Start time: 11/22/2020 9:05 AM End time: 11/22/2020 9:10 AM Injection made incrementally with aspirations every 5 mL.  Performed by: Personally  Anesthesiologist: Effie Berkshire, MD  Additional Notes: Patient tolerated the procedure well. Local anesthetic introduced in an incremental fashion under minimal resistance after negative aspirations. No paresthesias were elicited. After completion of the procedure, no acute issues were identified and patient continued to be monitored by RN.

## 2020-11-22 NOTE — Anesthesia Procedure Notes (Signed)
Anesthesia Regional Block: Popliteal block   Pre-Anesthetic Checklist: ,, timeout performed, Correct Patient, Correct Site, Correct Laterality, Correct Procedure, Correct Position, site marked, Risks and benefits discussed,  Surgical consent,  Pre-op evaluation,  At surgeon's request and post-op pain management  Laterality: Left  Prep: chloraprep       Needles:  Injection technique: Single-shot  Needle Type: Echogenic Stimulator Needle     Needle Length: 9cm  Needle Gauge: 21     Additional Needles:   Procedures:,,,, ultrasound used (permanent image in chart),,,,  Narrative:  Start time: 11/22/2020 9:00 AM End time: 11/22/2020 9:05 AM Injection made incrementally with aspirations every 5 mL.  Performed by: Personally  Anesthesiologist: Effie Berkshire, MD  Additional Notes: Patient tolerated the procedure well. Local anesthetic introduced in an incremental fashion under minimal resistance after negative aspirations. No paresthesias were elicited. After completion of the procedure, no acute issues were identified and patient continued to be monitored by RN.

## 2020-11-22 NOTE — Plan of Care (Signed)
  Problem: Clinical Measurements: Goal: Ability to maintain clinical measurements within normal limits will improve Outcome: Progressing   Problem: Clinical Measurements: Goal: Will remain free from infection Outcome: Progressing   Problem: Coping: Goal: Level of anxiety will decrease Outcome: Progressing   Problem: Pain Managment: Goal: General experience of comfort will improve Outcome: Progressing   Problem: Safety: Goal: Ability to remain free from injury will improve Outcome: Progressing

## 2020-11-22 NOTE — Op Note (Signed)
   Date of Surgery: 11/22/2020  INDICATIONS: Mr. Gonzales is a 80 y.o.-year-old male who has dehiscence of left transmetatarsal amputation.  PREOPERATIVE DIAGNOSIS: osteomyelitis left foot  POSTOPERATIVE DIAGNOSIS: Same.  PROCEDURE: Transtibial amputation Application of Prevena wound VAC  SURGEON: Sharol Given, M.D.  ANESTHESIA:  regional  IV FLUIDS AND URINE: See anesthesia records.  ESTIMATED BLOOD LOSS: See anesthesia records.  COMPLICATIONS: None.  DESCRIPTION OF PROCEDURE: The patient was brought to the operating room after undergoing regional anesthetic. After adequate levels of anesthesia were obtained patient's lower extremity was prepped using DuraPrep draped into a sterile field. A timeout was called. The foot was draped out of the sterile field with impervious stockinette. A transverse incision was made 11 cm distal to the tibial tubercle. This curved proximally and a large posterior flap was created. The tibia was transected 1 cm proximal to the skin incision. The fibula was transected just proximal to the tibial incision. The tibia was beveled anteriorly. A large posterior flap was created. The sciatic nerve was pulled cut and allowed to retract. The vascular bundles were suture ligated with 2-0 silk. The deep and superficial fascial layers were closed using #1 Vicryl. The skin was closed using staples and 2-0 nylon. The wound was covered with a Prevena customizable and arthroform wound VAC.  The dressing was sealed with dermatac there was a good suction fit. A prosthetic shrinker will be applied in patient's room. Patient was taken to the PACU in stable condition.   DISCHARGE PLANNING:  Antibiotic duration: 24 hours  Weightbearing: Nonweightbearing on the operative extremity  Pain medication: Opioid pathway  Dressing care/ Wound VAC: Continue wound VAC for 1 week after discharge  Discharge to: Discharge planning based on therapy's recommendations for possible inpatient  rehabilitation, outpatient rehabilitation, or discharge to home with therapy  Follow-up: In the office 1 week post operative.  Meridee Score, MD [place] 10:43 AM

## 2020-11-22 NOTE — Transfer of Care (Signed)
Immediate Anesthesia Transfer of Care Note  Patient: [name]  Procedure(s) Performed: LEFT BELOW KNEE AMPUTATION (Left Knee)  Patient Location: PACU  Anesthesia Type:MAC combined with regional for post-op pain  Level of Consciousness: awake, alert  and oriented  Airway & Oxygen Therapy: Patient Spontanous Breathing and Patient connected to face mask oxygen  Post-op Assessment: Report given to RN and Post -op Vital signs reviewed and stable  Post vital signs: Reviewed and stable  Last Vitals:  Vitals Value Taken Time  BP 117/66 11/22/20 1041  Temp    Pulse 58 11/22/20 1045  Resp 16 11/22/20 1045  SpO2 100 % 11/22/20 1045  Vitals shown include unvalidated device data.  Last Pain:  Vitals:   11/22/20 0759  TempSrc:   PainSc: 0-No pain         Complications: No complications documented.

## 2020-11-22 NOTE — Plan of Care (Signed)
  Problem: Clinical Measurements: Goal: Ability to maintain clinical measurements within normal limits will improve Outcome: Progressing Goal: Will remain free from infection Outcome: Progressing Goal: Diagnostic test results will improve Outcome: Progressing Goal: Respiratory complications will improve Outcome: Progressing Goal: Cardiovascular complication will be avoided Outcome: Progressing   Problem: Nutrition: Goal: Adequate nutrition will be maintained Outcome: Progressing   Problem: Coping: Goal: Level of anxiety will decrease Outcome: Progressing   Problem: Pain Managment: Goal: General experience of comfort will improve Outcome: Progressing   Problem: Safety: Goal: Ability to remain free from injury will improve Outcome: Progressing   

## 2020-11-22 NOTE — Progress Notes (Signed)
Inpatient Rehabilitation Admissions Coordinator  Inpatient rehab consult received. [employer] medicare will not approve CIR admit for amputations. Other rehab venues need to be considered. We will sign off.  Danne Baxter, RN, MSN Rehab Admissions Coordinator [phone] 11/22/2020 8:47 PM

## 2020-11-22 NOTE — Anesthesia Preprocedure Evaluation (Addendum)
Anesthesia Evaluation  Patient identified by MRN, date of birth, ID band Patient awake    Reviewed: Allergy & Precautions, NPO status , Patient's Chart, lab work & pertinent test results  Airway Mallampati: I  TM Distance: >3 FB Neck ROM: Full    Dental  (+) Edentulous Upper, Edentulous Lower   Pulmonary Current Smoker and Patient abstained from smoking.,    breath sounds clear to auscultation       Cardiovascular hypertension, + Past MI and + Peripheral Vascular Disease   Rhythm:Regular Rate:Normal     Neuro/Psych  Headaches,  Neuromuscular disease CVA negative psych ROS   GI/Hepatic negative GI ROS, Neg liver ROS,   Endo/Other  diabetes  Renal/GU Renal disease     Musculoskeletal  (+) Arthritis ,   Abdominal Normal abdominal exam  (+)   Peds  Hematology negative hematology ROS (+)   Anesthesia Other Findings   Reproductive/Obstetrics                            Anesthesia Physical Anesthesia Plan  ASA: III  Anesthesia Plan: Regional   Post-op Pain Management:    Induction: Intravenous  PONV Risk Score and Plan: 1 and Propofol infusion and Ondansetron  Airway Management Planned: Natural Airway and Simple Face Mask  Additional Equipment: None  Intra-op Plan:   Post-operative Plan:   Informed Consent: I have reviewed the patients History and Physical, chart, labs and discussed the procedure including the risks, benefits and alternatives for the proposed anesthesia with the patient or authorized representative who has indicated his/her understanding and acceptance.       Plan Discussed with: CRNA  Anesthesia Plan Comments: (Possible LMA)       Anesthesia Quick Evaluation

## 2020-11-22 NOTE — Anesthesia Postprocedure Evaluation (Signed)
Anesthesia Post Note  Patient: [name]  Procedure(s) Performed: LEFT BELOW KNEE AMPUTATION (Left Knee)     Patient location during evaluation: PACU Anesthesia Type: Regional Level of consciousness: awake and alert Pain management: pain level controlled Vital Signs Assessment: post-procedure vital signs reviewed and stable Respiratory status: spontaneous breathing, nonlabored ventilation, respiratory function stable and patient connected to nasal cannula oxygen Cardiovascular status: stable and blood pressure returned to baseline Postop Assessment: no apparent nausea or vomiting Anesthetic complications: no   No complications documented.  Last Vitals:  Vitals:   11/22/20 1255 11/22/20 1325  BP: 117/64 112/63  Pulse: 60 (!) 58  Resp: 12 11  Temp:    SpO2: 100% 100%    Last Pain:  Vitals:   11/22/20 1255  TempSrc:   PainSc: 0-No pain                 Effie Berkshire

## 2020-11-23 ENCOUNTER — Telehealth: Payer: Self-pay

## 2020-11-23 ENCOUNTER — Encounter (HOSPITAL_COMMUNITY): Payer: Self-pay | Admitting: Orthopedic Surgery

## 2020-11-23 LAB — CBC
HCT: 30 % — ABNORMAL LOW (ref 39.0–52.0)
Hemoglobin: 9.9 g/dL — ABNORMAL LOW (ref 13.0–17.0)
MCH: 28.1 pg (ref 26.0–34.0)
MCHC: 33 g/dL (ref 30.0–36.0)
MCV: 85.2 fL (ref 80.0–100.0)
Platelets: 293 10*3/uL (ref 150–400)
RBC: 3.52 MIL/uL — ABNORMAL LOW (ref 4.22–5.81)
RDW: 14.6 % (ref 11.5–15.5)
WBC: 12.6 10*3/uL — ABNORMAL HIGH (ref 4.0–10.5)
nRBC: 0 % (ref 0.0–0.2)

## 2020-11-23 LAB — GLUCOSE, CAPILLARY: Glucose-Capillary: 103 mg/dL — ABNORMAL HIGH (ref 70–99)

## 2020-11-23 LAB — BASIC METABOLIC PANEL
Anion gap: 8 (ref 5–15)
BUN: 12 mg/dL (ref 8–23)
CO2: 25 mmol/L (ref 22–32)
Calcium: 8.7 mg/dL — ABNORMAL LOW (ref 8.9–10.3)
Chloride: 103 mmol/L (ref 98–111)
Creatinine, Ser: 1.14 mg/dL (ref 0.61–1.24)
GFR, Estimated: >60 mL/min (ref >60)
Glucose, Bld: 148 mg/dL — ABNORMAL HIGH (ref 70–99)
Potassium: 4.1 mmol/L (ref 3.5–5.1)
Sodium: 136 mmol/L (ref 135–145)

## 2020-11-23 MED ORDER — OXYCODONE-ACETAMINOPHEN 5-325 MG PO TABS: 1.0000 | ORAL_TABLET | ORAL | 0 refills | Status: DC | PRN

## 2020-11-23 NOTE — Consult Note (Signed)
[facility] CM Inpatient Consult   11/23/2020  [name] 05-29-1941 [id number]  Patient reviewed for post hospital transition of care needs. Patient eligible for services under [place].  Spoke with patient at bedside. HIPAA verified. Explained [facility] CM services as outpatient care coordination services provided to assist with management of chronic disease and assistance with community needs. Patient states that he lives alone but has help from his niece. States that she provides transportation. Denies needs for medication assistance.  Patient has AKA this admission and has wound vac on extremity. Referral will be placed for [facility] CM services for assistance with meals and RN care coordination. Patient verbally agrees to post hospital follow up.  Of note, [facility] Care Management services does not replace or interfere with any services that are arranged by inpatient case management or social work.  Netta Cedars, MSN, [facility] Liaison Nurse Mobile Phone [phone]  Toll free office [phone]

## 2020-11-23 NOTE — Progress Notes (Signed)
Provided discharge education/instructions, educated on attaching, charging and possible error beeps with prevena wound vac, Pt states understanding. Pt not in distress, states he got his home keys and ready to go home. CM notified to arrange transport. Pt to discharge home with belongings via [place].

## 2020-11-23 NOTE — Evaluation (Signed)
Physical Therapy Evaluation Patient Details Name: [name] MRN: [id number] DOB: 1940-11-24 Today's Date: 11/23/2020   History of Present Illness  Pt is a 80 y.o. male who presented 6/7 with osteomyelitis of his L foot. S/p L BKA with wound vac application 6/8. PMH: arthritis, glaucoma, HTN, osteomyelitis, PVD, pre-diabetes, CVA.   Clinical Impression  Pt presents with condition above and deficits mentioned below, see PT Problem List. PTA, he was living alone in a ground-floor apartment without stairs and he was mobilizing with mod I with use of his RW, SPC, or w/c. Pt reports he has 2 nieces along with other help available to provide 24/7 assistance. Pt will need 24/7 care at this time to ensure safety with transfers and mobility as pt has poor vision, generalized weakness, and impaired balance that place him at risk for falls and injuries. If he can receive the level of care necessary at d/c then he could go home with HHPT, but if not he may benefit from a short-term rehab stay at a SNF to maximize his safety and independence with all functional mobility prior to return home. At this time, pt is needing min guard-minA for squat pivot transfers over an armrest and min guard-supervision for anterior scooting transfers into/out of a recliner. Pt was unsuccessful in coming to a full stand with a RW even with maxA this date. Educated and provided pt with handout on HEP for BKA and importance of correctly wrapping his residual limb and elevating it to manage the edema and shaping it. Will continue to follow acutely.     Follow Up Recommendations SNF;Home health PT;Supervision/Assistance - 24 hour (depending on level of care available at home)    Equipment Recommendations  None recommended by PT    Recommendations for Other Services       Precautions / Restrictions Precautions Precautions: Fall Precaution Comments: L BKA, R transmet amputation hx Restrictions Weight Bearing Restrictions:  Yes LLE Weight Bearing: Non weight bearing      Mobility  Bed Mobility Overal bed mobility: Independent             General bed mobility comments: Pt able to perform all bed mobility with extra time with bed flat and no use of rails.    Transfers Overall transfer level: Needs assistance Equipment used: Rolling walker (2 wheeled);None Transfers: Sit to/from [employer];Lateral/Scoot Transfers Sit to Stand: Max assist   Squat pivot transfers: Min guard;Min assist    Lateral/Scoot Transfers: Min guard;Supervision General transfer comment: Squat pivot to L EOB > recliner with pt sitting uon armrest of recliner prior to completing full transfer, min guard-minA to ensure safety. Pt attempted to return to bed through transferring back onto armrest of chair to then transfer to bed, minA for safety and cuing pt to return to seat of chair to try stand pivot instead. Unable to extend R knee and hip adequately to achieve full upright standing posture with maxA. Thus, returned pt to chair and placed anterior aspect of chair against EOB for pt to anteriorly and laterally scoot safely back into bed with min guard-supervision. Pt likely could perform lateral scoots into w/c with min guard-minA as he has drop arm armrests at home. Educated pt on necessity to have someone assist him with all transfers, ensure w/c is flush with target or prior surface, and w/c brakes are applied to ensure safety.  Ambulation/Gait             General Gait Details: Unable at this  time.  Financial trader Rankin (Stroke Patients Only)       Balance Overall balance assessment: Needs assistance Sitting-balance support: No upper extremity supported;Feet supported Sitting balance-Leahy Scale: Fair Sitting balance - Comments: Static sitting EOB with supervision.   Standing balance support: Bilateral upper extremity supported Standing balance-Leahy Scale:  Zero Standing balance comment: Unable to come to full stand with maxA and UE support.                             Pertinent Vitals/Pain Pain Assessment: Faces Faces Pain Scale: Hurts little more Pain Location: L residual limb Pain Descriptors / Indicators: Grimacing;Guarding Pain Intervention(s): Limited activity within patient's tolerance;Monitored during session;Repositioned    Home Living Family/patient expects to be discharged to:: Private residence Living Arrangements: Alone Available Help at Discharge: Family;Available 24 hours/day (x2 nieces who work school hours but states could get another person to help while they are at work) Type of Home: [place] Access: Level entry     [place]: One [place]: Environmental consultant - 2 wheels;Shower seat;Cane - single point;Grab bars - tub/shower;Grab bars - toilet;Hand held shower head;Wheelchair - manual      Prior Function Level of Independence: Needs assistance   Gait / Transfers Assistance Needed: using walker, cane for mobility. uses w/c intermittently outside of home.  ADL's / Homemaking Assistance Needed: Niece manages meds, finances, drives, and cleans apartment.        Hand Dominance   Dominant Hand: Right    Extremity/Trunk Assessment   Upper Extremity Assessment Upper Extremity Assessment: Defer to OT evaluation    Lower Extremity Assessment Lower Extremity Assessment: Generalized weakness;LLE deficits/detail;RLE deficits/detail RLE Deficits / Details: hx of transmet amputation; generalized weakness noted through functional mobility RLE Coordination: decreased gross motor LLE Deficits / Details: s/p L BKA; generalized weakness and limited ROM LLE Coordination: decreased gross motor       Communication   Communication: No difficulties  Cognition Arousal/Alertness: Awake/alert Behavior During Therapy: WFL for tasks assessed/performed Overall Cognitive Status: No family/caregiver present  to determine baseline cognitive functioning                                 General Comments: Pt repeating comments and needing extra time to find final answer to questions about home set-up. Unclear whether this is his baseline, no family present to confirm.      General Comments General comments (skin integrity, edema, etc.): Educated pt and provided handout on BKA exercises to perform at home; educated pt on necessity to elevate residual limb and properly wrap it for shaping and edema management    Exercises Amputee Exercises Quad Sets: Left;Other reps (comment);Supine;5 reps Gluteal Sets: Left;Other reps (comment);Supine (x2 reps) Hip Extension: Left;Other reps (comment);Supine (x2 reps) Hip ABduction/ADduction: Left;Other reps (comment);Supine (x3 reps) Hip Flexion/Marching: Left;Other reps (comment);Supine (x2 reps) Knee Flexion:  (educated on heel slides)   Assessment/Plan    PT Assessment Patient needs continued PT services  PT Problem List Decreased strength;Decreased range of motion;Decreased balance;Decreased activity tolerance;Decreased mobility;Decreased coordination;Decreased knowledge of use of DME;Decreased safety awareness;Decreased knowledge of precautions;Pain;Decreased skin integrity       PT Treatment Interventions DME instruction;Gait training;Functional mobility training;Therapeutic activities;Therapeutic exercise;Balance training;Neuromuscular re-education;Patient/family education;Wheelchair mobility training    PT Goals (Current goals can be found  in the Care Plan section)  Acute Rehab PT Goals Patient Stated Goal: to go home PT Goal Formulation: With patient Time For Goal Achievement: 12/07/20 Potential to Achieve Goals: Good    Frequency Min 3X/week   Barriers to discharge        Co-evaluation               AM-PAC PT "6 Clicks" Mobility  Outcome Measure Help needed turning from your back to your side while in a flat bed  without using bedrails?: None Help needed moving from lying on your back to sitting on the side of a flat bed without using bedrails?: None Help needed moving to and from a bed to a chair (including a wheelchair)?: A Little Help needed standing up from a chair using your arms (e.g., wheelchair or bedside chair)?: A Lot Help needed to walk in hospital room?: Total Help needed climbing 3-5 steps with a railing? : Total 6 Click Score: 15    End of Session Equipment Utilized During Treatment: Gait belt Activity Tolerance: Patient tolerated treatment well Patient left: in bed;with call bell/phone within reach;with bed alarm set Nurse Communication: Mobility status PT Visit Diagnosis: Unsteadiness on feet (R26.81);Muscle weakness (generalized) (M62.81);Difficulty in walking, not elsewhere classified (R26.2);Pain Pain - Right/Left: Left Pain - part of body: Leg    Time: 6122-4497 PT Time Calculation (min) (ACUTE ONLY): 38 min   Charges:   PT Evaluation $PT Eval Moderate Complexity: 1 Mod PT Treatments $Therapeutic Activity: 23-37 mins        Moishe Spice, PT, DPT Acute Rehabilitation Services  Pager: [phone] Office: [phone]   [name] 11/23/2020, 9:10 AM

## 2020-11-23 NOTE — Telephone Encounter (Signed)
Pts niece called concerned about pt he is refusing to go to a rehab and he is wanting to home instead with no care and she is concerned he will fall and injure himself.   [place] # [phone]

## 2020-11-23 NOTE — TOC Transition Note (Addendum)
Transition of Care [facility]) - CM/SW Discharge Note   Patient Details  Name: [name] MRN: [id number] Date of Birth: 1941/03/08  Transition of Care [facility]) CM/SW Contact:  Sharin Mons, RN Phone Number: 11/23/2020, 5:25 PM   Clinical Narrative:    Patient will DC to: home Anticipated DC date: 11/23/2020 Family notified: yes  Transport by: Corey Harold   Per MD patient ready for DC today. States pt competent in making decision for self. Pt adamantly refused SNF PLACEMENT. Lives alone. Limited family support.Agreeable to home health services. States will care for self.  Pt states has w/c, rolling walker , BSC and shower chair @ home. RN, patient, patient's niece [name] and [facility]  notified of DC. Ambulance/ [facility] transport  requested by NCM. Bedside nurse  states pt with key to enter into home.Transportation papers on front of chart.  Pt without Rx med concerns. Post hospital f/u noted on AVS.  RNCM will sign off for now as intervention is no longer needed. Please consult [place] again if new needs arise.    Final next level of care: Home w Home Health Services Barriers to Discharge: No Barriers Identified   Patient Goals and CMS Choice Patient states their goals for this hospitalization and ongoing recovery are:: to get better and go home   Choice offered to / list presented to : Patient  Discharge Placement                       Discharge Plan and Services                          HH Arranged: PT, OT, Social Work [facility] Agency: [place] Date [facility] Agency Contacted: 11/23/20 Time [place]: [place] Representative spoke with at [place]: [place] ([place]) Interventions     Readmission Risk Interventions Readmission Risk Prevention Plan 11/01/2018  Transportation Screening Complete  PCP or Specialist Appt within 5-7 Days Complete  Home Care Screening Complete  Medication Review (RN CM) Complete  Some recent data  might be hidden

## 2020-11-23 NOTE — Discharge Summary (Signed)
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 male who was admitted 11/22/2020 with a chief complaint of abscess and osteomyelitis left foot, with a final diagnosis of OSTOMYELITIS.  Patient was brought to the operating room on 11/22/2020 and underwent Procedure(s): LEFT BELOW KNEE AMPUTATION.    Patient was given perioperative antibiotics:  Anti-infectives (From admission, onward)    Start     Dose/Rate Route Frequency Ordered Stop   11/22/20 1600  ceFAZolin (ANCEF) IVPB 2g/100 mL premix        2 g 200 mL/hr over 30 Minutes Intravenous Every 8 hours 11/22/20 1435 11/23/20 0123   11/22/20 1530  vancomycin (VANCOREADY) IVPB 1000 mg/200 mL  Status:  Discontinued        1,000 mg 200 mL/hr over 60 Minutes Intravenous  Once 11/22/20 1435 11/22/20 1440   11/22/20 0718  ceFAZolin (ANCEF) 2-4 GM/100ML-% IVPB       Note to Pharmacy: Gleason, Ginger   : cabinet override      11/22/20 0718 11/22/20 1929     .  Patient was given sequential compression devices, early ambulation, and aspirin for DVT prophylaxis.  Recent vital signs: Patient Vitals for the past 24 hrs:  BP Temp Temp src Pulse Resp SpO2  11/23/20 0618 (!) 104/58 97.8 F (36.6 C) Oral (!) 59 18 100 %  11/23/20 0030 (!) 95/50 97.8 F (36.6 C) Oral 60 18 100 %  11/22/20 2021 102/90 98.3 F (36.8 C) Oral 69 18 100 %  11/22/20 1405 123/67 (!) 97.3 F (36.3 C) Oral 60 17 90 %  11/22/20 1325 112/63 -- -- (!) 58 11 100 %  11/22/20 1255 117/64 -- -- 60 12 100 %  11/22/20 1225 109/61 -- -- (!) 59 14 100 %  11/22/20 1155 (!) 108/58 -- -- 61 12 100 %  11/22/20 1125 121/75 -- -- 64 13 100 %  11/22/20 1110 117/61 (!) 97.3 F (36.3 C) -- (!) 57 12 100 %  11/22/20 1055 116/61 -- -- (!) 57 12 100 %   11/22/20 1040 117/66 (!) 97.4 F (36.3 C) -- (!) 58 16 100 %  11/22/20 0935 (!) 107/54 -- -- -- -- --  11/22/20 0930 (!) 97/52 -- -- -- -- --  11/22/20 0925 (!) 92/51 -- -- -- -- --  11/22/20 0920 (!) 107/54 -- -- -- -- --  11/22/20 0915 (!) 117/53 -- -- -- -- --  11/22/20 0910 129/68 -- -- -- -- --  11/22/20 0905 (!) 124/57 -- -- -- -- --  11/22/20 0900 (!) 152/71 -- -- -- -- --  .  Recent laboratory studies: No results found.  Discharge Medications:   Allergies as of 11/23/2020   No Known Allergies      Medication List     TAKE these medications    atorvastatin 80 MG tablet Commonly known as: LIPITOR Take 80 mg by mouth daily.   brimonidine 0.2 % ophthalmic solution Commonly known as: ALPHAGAN Place 1 drop into the right eye 2 (two) times daily.   clopidogrel 75 MG tablet Commonly known as: PLAVIX Take 1 tablet (75 mg total) by mouth daily.   cycloSPORINE 0.05 % ophthalmic emulsion Commonly known as: RESTASIS Place 1 drop into both eyes 2 (two)  times daily.   dorzolamidel-timolol 22.3-6.8 MG/ML Soln ophthalmic solution Commonly known as: COSOPT Place 1 drop into the right eye 2 (two) times daily.   Dorzolamide HCl-Timolol Mal PF 2-0.5 % Soln Place 1 drop into both eyes 2 (two) times daily.   gabapentin 300 MG capsule Commonly known as: NEURONTIN Take 1 capsule (300 mg total) by mouth at bedtime. What changed: when to take this   hydroxypropyl methylcellulose / hypromellose 2.5 % ophthalmic solution Commonly known as: ISOPTO TEARS / GONIOVISC Place 1 drop into both eyes 3 (three) times daily as needed for dry eyes.   ketorolac 0.5 % ophthalmic solution Commonly known as: ACULAR Place 1 drop into both eyes daily.   oxyCODONE-acetaminophen 5-325 MG tablet Commonly known as: PERCOCET/ROXICET Take 1 tablet by mouth every 4 (four) hours as needed.   traMADol 50 MG tablet Commonly known as: ULTRAM Take 1 tablet (50 mg total) by mouth every 6 (six) hours  as needed for moderate pain.        Diagnostic Studies: No results found.  Patient benefited maximally from their hospital stay and there were no complications.     Disposition: Discharge disposition: 01-Home or Self Care      Discharge Instructions     Call MD / Call 911   Complete by: As directed    If you experience chest pain or shortness of breath, CALL 911 and be transported to the hospital emergency room.  If you develope a fever above 101 F, pus (white drainage) or increased drainage or redness at the wound, or calf pain, call your surgeon's office.   Constipation Prevention   Complete by: As directed    Drink plenty of fluids.  Prune juice may be helpful.  You may use a stool softener, such as Colace (over the counter) 100 mg twice a day.  Use MiraLax (over the counter) for constipation as needed.   Diet - low sodium heart healthy   Complete by: As directed    Increase activity slowly as tolerated   Complete by: As directed    Negative Pressure Wound Therapy - Incisional   Complete by: As directed    Show patient how to attach preveena vac. [place] office if alarms   Post-operative opioid taper instructions:   Complete by: As directed    POST-OPERATIVE OPIOID TAPER INSTRUCTIONS: It is important to wean off of your opioid medication as soon as possible. If you do not need pain medication after your surgery it is ok to stop day one. Opioids include: Codeine, Hydrocodone(Norco, Vicodin), Oxycodone(Percocet, oxycontin) and hydromorphone amongst others.  Long term and even short term use of opiods can cause: Increased pain response Dependence Constipation Depression Respiratory depression And more.  Withdrawal symptoms can include Flu like symptoms Nausea, vomiting And more Techniques to manage these symptoms Hydrate well Eat regular healthy meals Stay active Use relaxation techniques(deep breathing, meditating, yoga) Do Not substitute Alcohol to help with  tapering If you have been on opioids for less than two weeks and do not have pain than it is ok to stop all together.  Plan to wean off of opioids This plan should start within one week post op of your joint replacement. Maintain the same interval or time between taking each dose and first decrease the dose.  Cut the total daily intake of opioids by one tablet each day Next start to increase the time between doses. The last dose that should be eliminated is the evening dose.  Follow-up Information     Suzan Slick, NP In 1 week.   Specialty: Orthopedic Surgery Contact information: [address] [place] [address] [phone]                  Signed: Newt Minion 11/23/2020, 7:51 AM

## 2020-11-23 NOTE — Plan of Care (Signed)
  Problem: Clinical Measurements: Goal: Ability to maintain clinical measurements within normal limits will improve Outcome: Progressing   Problem: Clinical Measurements: Goal: Will remain free from infection Outcome: Progressing   Problem: Coping: Goal: Level of anxiety will decrease 11/23/2020 0310 by Claire Shown, RN Outcome: Progressing   Problem: Pain Managment: Goal: General experience of comfort will improve 11/23/2020 0310 by Claire Shown, RN Outcome: Progressing 11/22/2020 2347 by Claire Shown, RN Outcome: Progressing   Problem: Safety: Goal: Ability to remain free from injury will improve 11/23/2020 0310 by Claire Shown, RN Outcome: Progressing 11/22/2020 2347 by Claire Shown, RN Outcome: Progressing

## 2020-11-23 NOTE — Progress Notes (Signed)
Subjective: 1 Day Post-Op Procedure(s) (LRB): LEFT BELOW KNEE AMPUTATION (Left) Patient reports pain as mild.    Objective: Vital signs in last 24 hours: Temp:  [97.3 F (36.3 C)-98.3 F (36.8 C)] 97.8 F (36.6 C) (06/09 0618) Pulse Rate:  [57-69] 59 (06/09 0618) Resp:  [11-18] 18 (06/09 0618) BP: (92-152)/(50-90) 104/58 (06/09 0618) SpO2:  [90 %-100 %] 100 % (06/09 0618)  Intake/Output from previous day: 06/08 0701 - 06/09 0700 In: 2823.3 [P.O.:720; I.V.:2000; IV Piggyback:103.3] Out: 850 [Urine:825; Blood:25] Intake/Output this shift: No intake/output data recorded.  Recent Labs    11/22/20 0712 11/23/20 0136  HGB 10.7* 9.9*   Recent Labs    11/22/20 0712 11/23/20 0136  WBC 8.3 12.6*  RBC 3.77* 3.52*  HCT 33.0* 30.0*  PLT 292 293   Recent Labs    11/22/20 0712 11/23/20 0136  NA 135 136  K 3.4* 4.1  CL 102 103  CO2 24 25  BUN 11 12  CREATININE 1.09 1.14  GLUCOSE 95 148*  CALCIUM 8.9 8.7*   No results for input(s): LABPT, INR in the last 72 hours.  No cellulitis present Compartment soft   Assessment/Plan: 1 Day Post-Op Procedure(s) (LRB): LEFT BELOW KNEE AMPUTATION (Left) Discharge home with home health      Newt Minion 11/23/2020, 7:47 AM

## 2020-11-23 NOTE — Progress Notes (Addendum)
Pt  s/p L BELOW KNEE AMPUTATION on 11/22/2020.  NCM spoke with pt @ bedside with [name] ( niece on speaker phone) regarding d/c plan. NCM explained we are trying to ensure a safe d/c is in place.  Shared [name] recommendation: SNF;Home health PT;Supervision/Assistance - 24 hour (depending on level of care available at home). Pt adamant about d/c to home. Pt states he been to a SNF and not going back." It's a piece of shit". Per niece pt lives alone, vision impaired. States she has assisted  with caring for pt's infected  L foot PTA twice a day over the past 3 weeks. States she will not be available to care for pt if d/c to home. States pt without family / friend support.   NCM made provider aware via secured chat and per provider pt is deemed competent  to make decision concerning self.  NCM  discussed case with TOC supervisor.  Pt will need arrangements made with [facility] for transportation to home. Pt without key to enter home. Niece [name] to bring home key to pt once her work shift completes @ 4pm.  Pt agreeable to home health services. Pt without preference. [facility] initially accepted , then rescinded 2/2 prevena vac, no nurse to send. States a nurse would be needed per their agency's protocol and their staffing is limited. Referral made with [facility] , acceptance pending  TOC team will continue to monitor and assist with care.  11/23/2020 @1557  No response from [facility]. Referral made with [facility] and accepted , SOC within 48 hours post d/c.

## 2020-11-23 NOTE — Progress Notes (Signed)
Switched to prevena wound vac, dressing clean,dry and intact, no error noted. All questions answered.

## 2020-11-23 NOTE — Plan of Care (Addendum)
RN talked to [name] niece, [name] regarding discharge plans. She states she prefers for him to go to [place] first before going home. She is 3 mins away from the facility and could get to Pt when he needs her. Pt is aware of this but Pt is adamant on going home stating "I would go home one way or the other. I will not go anywhere except home."  Problem: Clinical Measurements: Goal: Ability to maintain clinical measurements within normal limits will improve Outcome: Progressing   Problem: Nutrition: Goal: Adequate nutrition will be maintained Outcome: Progressing   Problem: Coping: Goal: Level of anxiety will decrease Outcome: Progressing   Problem: Pain Managment: Goal: General experience of comfort will improve Outcome: Progressing   Problem: Safety: Goal: Ability to remain free from injury will improve Outcome: Progressing

## 2020-11-23 NOTE — Evaluation (Signed)
Occupational Therapy Evaluation Patient Details Name: [name] MRN: [id number] DOB: 1941/05/13 Today's Date: 11/23/2020    History of Present Illness Pt is a 80 y.o. male who presented 6/7 with osteomyelitis of his L foot. S/p L BKA with wound vac application 6/8. PMH: arthritis, glaucoma, HTN, osteomyelitis, PVD, pre-diabetes, CVA.   Clinical Impression   Pt presents with decline in function and safety with ADLs and ADL mobility with impaired strength, balance and endurance; poor insight into deficits/poor safety awareness. PTA pt lived at home alone in a ground level apartment, was Ind with ADLs/selfcare, used RW or cane in his apartment and w/c outside. [name] niece provides transportation, cooking, laundry and cleaning. Pt currently requires sup to sit EOB, min guard A for UB ADLS, mod A for LB ADLs, max A sit - stand, min A for SQPTs. Pt would benefit from acute OT services to address impairments to maximize level of function and safety  Pt adamant about going home vs ST rehab and expressed his anger at his family and therapy staff for planning /recommending SNF. Pt educated on importance of a safe d/c and the benefits of ST SNF rehab, however pt became more agitated and states  "I am a grown man and I can do what I want to do!" "The doctor said I can go home and my niece doesn't tell me what to do, I don't need her!"    Follow Up Recommendations  SNF;Supervision/Assistance - 24 hour    Equipment Recommendations  None recommended by OT    Recommendations for Other Services       Precautions / Restrictions Precautions Precautions: Fall Precaution Comments: L BKA, R transmet amputation hx Restrictions Weight Bearing Restrictions: Yes LLE Weight Bearing: Non weight bearing      Mobility Bed Mobility Overal bed mobility: Needs Assistance Bed Mobility: Supine to Sit     Supine to sit: Supervision          Transfers Overall transfer level: Needs assistance Equipment  used: Rolling walker (2 wheeled);None Transfers: Sit to/from [employer];Lateral/Scoot Transfers Sit to Stand: Max assist   Squat pivot transfers: Min assist    Lateral/Scoot Transfers: Min guard General transfer comment: attempte x 3 sit - stand from EOB to RW unitl pt able to stand although unable to come to upright stance    Balance Overall balance assessment: Needs assistance Sitting-balance support: No upper extremity supported;Feet supported Sitting balance-Leahy Scale: Fair     Standing balance support: Bilateral upper extremity supported;During functional activity Standing balance-Leahy Scale: Zero                             ADL either performed or assessed with clinical judgement   ADL Overall ADL's : Needs assistance/impaired Eating/Feeding: Set up;Sitting   Grooming: Wash/dry hands;Wash/dry face;Min guard;Sitting   Upper Body Bathing: Min guard;Sitting   Lower Body Bathing: Moderate assistance;Sitting/lateral leans   Upper Body Dressing : Min guard;Sitting   Lower Body Dressing: Moderate assistance;Sitting/lateral leans   Toilet Transfer: Minimal assistance;Squat-pivot;BSC;Cueing for safety;Cueing for sequencing Toilet Transfer Details (indicate cue type and reason): max A sit - stand transition from EOB x 3 attempts Toileting- Clothing Manipulation and Hygiene: Moderate assistance;Sitting/lateral lean       Functional mobility during ADLs: Maximal assistance;Minimal assistance;Cueing for safety;Cueing for sequencing       Vision Baseline Vision/History: Legally blind Patient Visual Report: No change from baseline       Perception  Praxis      Pertinent Vitals/Pain Pain Assessment: Faces Faces Pain Scale: Hurts little more Pain Location: L residual limb Pain Descriptors / Indicators: Grimacing;Guarding Pain Intervention(s): Monitored during session;Limited activity within patient's tolerance;Premedicated before  session;Repositioned     Hand Dominance Right   Extremity/Trunk Assessment Upper Extremity Assessment Upper Extremity Assessment: Generalized weakness   Lower Extremity Assessment Lower Extremity Assessment: Defer to PT evaluation       Communication Communication Communication: No difficulties   Cognition Arousal/Alertness: Awake/alert Behavior During Therapy: WFL for tasks assessed/performed;Agitated Overall Cognitive Status: No family/caregiver present to determine baseline cognitive functioning                                 General Comments: Pt states that he is irrtiated/mad that his neice wants him to stay at hospital another day and go to rehab as he just wants to go home. Pt requried redirection multiple times to stay on tasks   General Comments       Exercises     Shoulder Instructions      Home Living Family/patient expects to be discharged to:: Private residence Living Arrangements: Alone Available Help at Discharge: Family;Available PRN/intermittently Type of Home: Apartment Home Access: Level entry     Home Layout: One level     Bathroom Shower/Tub: Occupational psychologist: Standard Bathroom Accessibility: Yes   Home Equipment: Environmental consultant - 2 wheels;Shower seat;Cane - single point;Grab bars - tub/shower;Grab bars - toilet;Hand held shower head;Wheelchair - manual          Prior Functioning/Environment Level of Independence: Needs assistance  Gait / Transfers Assistance Needed: using walker, cane for mobility. uses w/c intermittently outside of home. ADL's / Homemaking Assistance Needed: Pt Ind with ADLs/selfcare. Niece manages meds, finances, drives, and cleans apartment.            OT Problem List: Decreased strength;Impaired balance (sitting and/or standing);Decreased safety awareness;Decreased activity tolerance;Decreased coordination      OT Treatment/Interventions: Self-care/ADL training;DME and/or AE  instruction;Therapeutic activities;Balance training;Therapeutic exercise;Patient/family education    OT Goals(Current goals can be found in the care plan section) Acute Rehab OT Goals Patient Stated Goal: to go home OT Goal Formulation: With patient Time For Goal Achievement: 12/07/20 Potential to Achieve Goals: Good ADL Goals Pt Will Perform Grooming: with supervision;with set-up;sitting Pt Will Perform Upper Body Bathing: with supervision;with set-up;sitting Pt Will Perform Lower Body Bathing: with min assist;with min guard assist;sitting/lateral leans Pt Will Perform Upper Body Dressing: with supervision;with set-up;sitting Pt Will Transfer to Toilet: with min assist;with min guard assist;stand pivot transfer;bedside commode Pt Will Perform Toileting - Clothing Manipulation and hygiene: with min assist;with min guard assist;sitting/lateral leans  OT Frequency: Min 2X/week   Barriers to D/C:            Co-evaluation              AM-PAC OT "6 Clicks" Daily Activity     Outcome Measure Help from another person eating meals?: None Help from another person taking care of personal grooming?: A Little Help from another person toileting, which includes using toliet, bedpan, or urinal?: A Lot Help from another person bathing (including washing, rinsing, drying)?: A Lot Help from another person to put on and taking off regular upper body clothing?: A Little Help from another person to put on and taking off regular lower body clothing?: A Lot 6 Click Score: 16  End of Session Equipment Utilized During Treatment: Gait belt;Rolling walker;Other (comment) [facility]) Nurse Communication: Mobility status  Activity Tolerance: Patient limited by fatigue Patient left: in bed;with call bell/phone within reach;with bed alarm set  OT Visit Diagnosis: Other abnormalities of gait and mobility (R26.89);Muscle weakness (generalized) (M62.81);Low vision, both eyes (H54.2)                Time:  1287-8676 OT Time Calculation (min): 27 min Charges:  OT General Charges $OT Visit: 1 Visit OT Evaluation $OT Eval Moderate Complexity: 1 Mod OT Treatments $Self Care/Home Management : 8-22 mins    Britt Bottom 11/23/2020, 12:33 PM

## 2020-11-24 ENCOUNTER — Other Ambulatory Visit: Payer: Self-pay | Admitting: *Deleted

## 2020-11-24 DIAGNOSIS — Z89519 Acquired absence of unspecified leg below knee: Secondary | ICD-10-CM

## 2020-11-24 NOTE — Telephone Encounter (Signed)
I called and sw pt's niece. She is very upset that the pt was d/c from the hospital last night at 10pm. She states that she is leaving out of the country for the next month and advised she will not be there to take care of him and even before this BKA she would check in on him but was not there providing care. She voiced concerns at the hospital and social worker advised that she would get home health involved and get social work services as well to see what community based programs the pt could qualify for. [name] is the agency the referral was given to. She is going to check on the pt this afternoon and will call with update. Will hold message pending return call.

## 2020-11-24 NOTE — Patient Outreach (Signed)
[place] [facility]) Care Management  [place]  11/24/2020   [name] 06-Feb-1941 [id number]   Referral Date: 6/10 Referral Source: Hospital liaison Referral Reason: Post discharge Insurance: [facility]   Outreach attempt #1, successful.  Identity verified.  This care manager introduced self and stated purpose of call.  [facility] care management services explained.    Social: Member report living alone, aware of the need and recommendations to go to rehab but refused.  State he is able to care for himself with the help of his family.  During conversation, difficult to understand member as he is not a good historian.  Does grant permission to speak with niece.  Able to speak to niece, [name], state member has been very adamant about staying in his home despite not having the appropriate care.  She report she is leaving to go out of the country for the next several weeks and her sister is going with her.  Her brother is caring for his 35 year old mother, unable to offer assistance with member.  [name] had been going over to his home twice a day prior to hospitalization, unable to do so now since discharge.  Aware that [name] has been ordered, also aware they aren't able to provide the level of care member may need.  Verbalizes understanding that member's risk of readmission is very high.    Conditions: Status post left BKA recently, has wound vac in place.  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  Medications: Reviewed with niece, state he is not adherent to medication regime nor plan of care in general.  Appointments: Follow up with Dr. Sharol Given on 6/13, niece will be out of town, unsure how member will get to office.  He report he will either take ambulance or cab.  Niece report member does have a friend that drives cabs, she will discuss appointment with him directly.  Advance Directives:Niece report she and her sister has POA of  member.   Consent: Niece and member both agree to [facility] involvement.  Encounter Medications:  Outpatient Encounter Medications as of 11/24/2020  Medication Sig Note   gabapentin (NEURONTIN) 300 MG capsule Take 1 capsule (300 mg total) by mouth at bedtime.    atorvastatin (LIPITOR) 80 MG tablet Take 80 mg by mouth daily.    brimonidine (ALPHAGAN) 0.2 % ophthalmic solution Place 1 drop into the right eye 2 (two) times daily.    clopidogrel (PLAVIX) 75 MG tablet Take 1 tablet (75 mg total) by mouth daily. 11/22/2020: Not clear when he took last.  Per Jan, RN note may not be taking his medications as ordered.   cycloSPORINE (RESTASIS) 0.05 % ophthalmic emulsion Place 1 drop into both eyes 2 (two) times daily.    Dorzolamide HCl-Timolol Mal PF 2-0.5 % SOLN Place 1 drop into both eyes 2 (two) times daily.    dorzolamidel-timolol (COSOPT) 22.3-6.8 MG/ML SOLN ophthalmic solution Place 1 drop into the right eye 2 (two) times daily.    hydroxypropyl methylcellulose / hypromellose (ISOPTO TEARS / GONIOVISC) 2.5 % ophthalmic solution Place 1 drop into both eyes 3 (three) times daily as needed for dry eyes.    ketorolac (ACULAR) 0.5 % ophthalmic solution Place 1 drop into both eyes daily.    oxyCODONE-acetaminophen (PERCOCET/ROXICET) 5-325 MG tablet Take 1 tablet by mouth every 4 (four) hours as needed.    traMADol (ULTRAM) 50 MG tablet Take 1 tablet (50 mg total) by mouth  every 6 (six) hours as needed for moderate pain.    No facility-administered encounter medications on file as of 11/24/2020.    Functional Status:  In your present state of health, do you have any difficulty performing the following activities: 11/22/2020 11/22/2020  Hearing? - N  Vision? - Y  Difficulty concentrating or making decisions? - N  Walking or climbing stairs? - N  Comment - -  Dressing or bathing? - N  Comment - -  Doing errands, shopping? N -  Some recent data might be hidden    Fall/Depression Screening: Fall Risk   11/24/2020 11/11/2018 07/28/2017  Falls in the past year? 0 0 No  Number falls in past yr: 0 0 -  Injury with Fall? 0 0 -  Risk for fall due to : Impaired balance/gait;Orthopedic patient - -  Follow up - Falls evaluation completed -   PHQ 2/9 Scores 11/24/2020 09/06/2014 08/23/2014 08/23/2014 08/23/2014  PHQ - 2 Score 0 0 0 0 0    Assessment:   Care Plan Care Plan : General Plan of Care (Adult)  Updates made by Casey David, RN since 11/24/2020 12:00 AM     Problem: Health Promotion or Disease Self-Management (General Plan of Care)      Goal: Self-Management Plan Developed   Start Date: 11/24/2020  Expected End Date: 12/24/2020  Priority: High  Note:   Evidence-based guidance:  Review biopsychosocial determinants of health screens.  Determine level of modifiable health risk.  Assess level of patient activation, level of readiness, importance and confidence to make changes.  Evoke change talk using open-ended questions, pros and cons, as well as looking forward.  Identify areas where behavior change may lead to improved health.  Partner with patient to develop a robust self-management plan that includes lifestyle factors, such as weight loss, exercise and healthy nutrition, as well as goals specific to disease risks.  Support patient and family/caregiver active participation in decision-making and self-management plan.  Implement additional goals and interventions based on identified risk factors to reduce health risk.  Facilitate advance care planning.  Review need for preventive screening based on age, sex, family history and health history.   Notes:     Task: Mutually Develop and Royce Macadamia Achievement of Patient Goals   Due Date: 12/24/2020  Note:   Care Management Activities:    - barriers to meeting goals identified - choices provided - decision-making supported - health risks reviewed    Notes:     Problem: Quality of Life (General Plan of Care)      Long-Range Goal:  Quality of Life Maintained   Start Date: 11/24/2020  Expected End Date: 02/22/2021  Priority: Medium  Note:   Evidence-based guidance:  Assess patient's thoughts about quality of life, goals and expectations, and dissatisfaction or desire to improve.  Identify issues of primary importance such as mental health, illness, exercise tolerance, pain, sexual function and intimacy, cognitive change, social isolation, finances and relationships.  Assess and monitor for signs/symptoms of psychosocial concerns, especially depression or ideations regarding harm to others or self; provide or refer for mental health services as needed.  Identify sensory issues that impact quality of life such as hearing loss, vision deficit; strategize ways to maintain or improve hearing, vision.  Promote access to services in the community to support independence such as support groups, home visiting programs, financial assistance, handicapped parking tags, durable medical equipment and emergency responder.  Promote activities to decrease social isolation such as group support or  social, leisure and recreational activities, employment, use of social media; consider safety concerns about being out of home for activities.  Provide patient an opportunity to share by storytelling or a "life review" to give positive meaning to life and to assist with coping and negative experiences.  Encourage patient to tap into hope to improve sense of self.  Counsel based on prognosis and as early as possible about end-of-life and palliative care; consider referral to palliative care provider.  Advocate for the development of palliative care plan that may include avoidance of unnecessary testing and intervention, symptom control, discontinuation of medications, hospice and organ donation.  Counsel as early as possible those with life-limiting chronic disease about palliative care; consider referral to palliative care provider.  Advocate for the  development of palliative care plan.   Notes:     Task: Support and Maintain Acceptable Degree of Health, Comfort and Happiness   Due Date: 02/22/2021  Note:   Care Management Activities:    - self-expression encouraged - social relationships promoted - wellness behaviors promoted    Notes:       Goals Addressed             This Visit's Progress    [facility] - Find Help in My Community       Timeframe:  Short-Term Goal Priority:  Medium Start Date:          6/10                   Expected End Date:  7/10                     Barriers: Health Behaviors Knowledge Support System  Follow Up Date 12/01/2020   - follow-up on any referrals for help I am given    Why is this important?   Knowing how and where to find help for yourself or family in your neighborhood and community is an important skill.  You will want to take some steps to learn how.    Notes:   6/10 - Referral placed to Care Guide for transportation and meal resources      [facility] - Make and Keep All Appointments       Timeframe:  Long-Range Goal Priority:  High Start Date:         6/10                    Expected End Date:    9/10                    Follow Up Date 6/17  Barriers: Health Behaviors Support System Transportation    - call to cancel if needed - keep a calendar with appointment dates    Why is this important?   Part of staying healthy is seeing the doctor for follow-up care.  If you forget your appointments, there are some things you can do to stay on track.    Notes:   6/10 - Reviewed upcoming appointment with member and niece, discussed options for transportation resources         Plan:  Follow-up: Patient agrees to Care Plan and Follow-up. Follow-up in 1 week(s).  Will place referral to care guide.  [name], [place], MSN [place] [phone]

## 2020-11-26 DIAGNOSIS — I2101 ST elevation (STEMI) myocardial infarction involving left main coronary artery: Secondary | ICD-10-CM | POA: Diagnosis not present

## 2020-11-26 DIAGNOSIS — E1151 Type 2 diabetes mellitus with diabetic peripheral angiopathy without gangrene: Secondary | ICD-10-CM | POA: Diagnosis not present

## 2020-11-26 DIAGNOSIS — S98922D Partial traumatic amputation of left foot, level unspecified, subsequent encounter: Secondary | ICD-10-CM | POA: Diagnosis not present

## 2020-11-26 DIAGNOSIS — I639 Cerebral infarction, unspecified: Secondary | ICD-10-CM | POA: Diagnosis not present

## 2020-11-27 ENCOUNTER — Encounter: Payer: Self-pay | Admitting: Orthopedic Surgery

## 2020-11-27 ENCOUNTER — Ambulatory Visit (INDEPENDENT_AMBULATORY_CARE_PROVIDER_SITE_OTHER): Payer: Medicare Other | Admitting: Orthopedic Surgery

## 2020-11-27 ENCOUNTER — Telehealth: Payer: Self-pay | Admitting: *Deleted

## 2020-11-27 DIAGNOSIS — E1151 Type 2 diabetes mellitus with diabetic peripheral angiopathy without gangrene: Secondary | ICD-10-CM | POA: Diagnosis not present

## 2020-11-27 DIAGNOSIS — E782 Mixed hyperlipidemia: Secondary | ICD-10-CM | POA: Diagnosis not present

## 2020-11-27 DIAGNOSIS — Z7902 Long term (current) use of antithrombotics/antiplatelets: Secondary | ICD-10-CM | POA: Diagnosis not present

## 2020-11-27 DIAGNOSIS — Z89512 Acquired absence of left leg below knee: Secondary | ICD-10-CM | POA: Diagnosis not present

## 2020-11-27 DIAGNOSIS — H539 Unspecified visual disturbance: Secondary | ICD-10-CM | POA: Diagnosis not present

## 2020-11-27 DIAGNOSIS — Z9181 History of falling: Secondary | ICD-10-CM | POA: Diagnosis not present

## 2020-11-27 DIAGNOSIS — I10 Essential (primary) hypertension: Secondary | ICD-10-CM | POA: Diagnosis not present

## 2020-11-27 DIAGNOSIS — Z87891 Personal history of nicotine dependence: Secondary | ICD-10-CM | POA: Diagnosis not present

## 2020-11-27 DIAGNOSIS — Z89431 Acquired absence of right foot: Secondary | ICD-10-CM | POA: Diagnosis not present

## 2020-11-27 DIAGNOSIS — Z8673 Personal history of transient ischemic attack (TIA), and cerebral infarction without residual deficits: Secondary | ICD-10-CM | POA: Diagnosis not present

## 2020-11-27 DIAGNOSIS — H409 Unspecified glaucoma: Secondary | ICD-10-CM | POA: Diagnosis not present

## 2020-11-27 DIAGNOSIS — B0229 Other postherpetic nervous system involvement: Secondary | ICD-10-CM | POA: Diagnosis not present

## 2020-11-27 DIAGNOSIS — Z4781 Encounter for orthopedic aftercare following surgical amputation: Secondary | ICD-10-CM | POA: Diagnosis not present

## 2020-11-27 DIAGNOSIS — M109 Gout, unspecified: Secondary | ICD-10-CM | POA: Diagnosis not present

## 2020-11-27 DIAGNOSIS — I252 Old myocardial infarction: Secondary | ICD-10-CM | POA: Diagnosis not present

## 2020-11-27 DIAGNOSIS — K449 Diaphragmatic hernia without obstruction or gangrene: Secondary | ICD-10-CM | POA: Diagnosis not present

## 2020-11-27 DIAGNOSIS — K644 Residual hemorrhoidal skin tags: Secondary | ICD-10-CM | POA: Diagnosis not present

## 2020-11-27 DIAGNOSIS — K760 Fatty (change of) liver, not elsewhere classified: Secondary | ICD-10-CM | POA: Diagnosis not present

## 2020-11-27 DIAGNOSIS — M1812 Unilateral primary osteoarthritis of first carpometacarpal joint, left hand: Secondary | ICD-10-CM | POA: Diagnosis not present

## 2020-11-27 LAB — SURGICAL PATHOLOGY

## 2020-11-27 NOTE — Telephone Encounter (Signed)
   Telephone encounter was:  Successful.  11/27/2020 Name: [name] MRN: [id number] DOB: 1941-02-14  [name] is a 80 y.o. year old male who is a primary care patient of [name], MD . The community resource team was consulted for assistance with Transportation Needs  and [place] guide performed the following interventions: Called patient told me to call niece, Niece making plans for when she has to be gone for uncle , he would not go to rehab so he is at home alone and needs meals and transportation to Doctors   Follow Up Plan:  No further follow up planned at this time. The patient has been provided with needed resources. [place], Care Management  [phone] [address] , [place] [address] Email : [email or web address]

## 2020-11-27 NOTE — Progress Notes (Signed)
Office Visit Note   Patient: [name]           Date of Birth: May 23, 1941           MRN: [id number] Visit Date: 11/27/2020              Requested by: [name], MD [address] [place],  [place] [address] PCP: [name], MD  Chief Complaint  Patient presents with   Left Leg - Routine Post Op    11/22/20 left BKA pt wearing limb protector and shrinker. D/c vac today but drainage unknown as the pt has disconnected the canister drom the dressing. Will follow up on [facility] for SOC      HPI: Patient is a 80 year old gentleman who is 5 days status post left below the knee amputation.  Patient disconnected the wound VAC at home and is not attached to his dressing.  Patient is currently followed by [facility] and and patient will need assistance at home including transportation and meals.  Assessment & Plan: Visit Diagnoses:  1. S/P BKA (below knee amputation) unilateral, left (HCC)     Plan: A dry dressing was applied patient will be given washing with soap and water and use the stump shrinker around-the-clock.  Follow-up in 2 weeks to harvest the staples.  We will reach out to [facility] and to ensure patient has the appropriate resources at home.  Follow-Up Instructions: Return in about 2 weeks (around 12/11/2020).   Ortho Exam  Patient is alert, oriented, no adenopathy, well-dressed, normal affect, normal respiratory effort. Examination the wound is well approximated there is good petechial bleeding along the wound edges there is no redness no cellulitis no odor no drainage no signs of infection.  4 x 4 plus Ace plus his stump shrinker was applied.  Imaging: No results found. No images are attached to the encounter.  Labs: Lab Results  Component Value Date   HGBA1C 5.7 (H) 03/21/2020   HGBA1C 5.4 11/01/2018   HGBA1C 5.2 05/29/2018   ESRSEDRATE 50 (H) 03/20/2020   ESRSEDRATE 50 (H) 03/17/2020   ESRSEDRATE 34 (H) 12/17/2017   CRP 13.5 (H) 03/25/2020    CRP 0.8 03/17/2020   CRP 17.1 (H) 12/17/2017   REPTSTATUS 03/25/2020 FINAL 03/19/2020   REPTSTATUS 03/25/2020 FINAL 03/19/2020   REPTSTATUS 03/21/2020 FINAL 03/19/2020   GRAMSTAIN  03/17/2020    FEW WBC PRESENT, PREDOMINANTLY PMN FEW GRAM POSITIVE COCCI    CULT  03/19/2020    NO GROWTH 5 DAYS Performed at [facility], [place] [address] [place], [place] [address]    CULT  03/19/2020    NO GROWTH 5 DAYS Performed at [facility] [address], [place], [place] [address]    CULT (A) 03/19/2020    <10,000 COLONIES/mL INSIGNIFICANT GROWTH Performed at [facility] [address], [place], [place] [address]    LABORGA METHICILLIN RESISTANT STAPHYLOCOCCUS AUREUS 03/17/2020     Lab Results  Component Value Date   ALBUMIN 3.5 03/19/2020   ALBUMIN 3.7 02/05/2020   ALBUMIN 3.2 (L) 10/31/2018    Lab Results  Component Value Date   MG 1.9 03/27/2020   MG 1.6 (L) 03/26/2020   MG 1.4 (L) 03/25/2020   No results found for: VD25OH  No results found for: PREALBUMIN CBC EXTENDED Latest Ref Rng & Units 11/23/2020 11/22/2020 03/27/2020  WBC 4.0 - 10.5 K/uL 12.6(H) 8.3 8.9  RBC 4.22 - 5.81 MIL/uL 3.52(L) 3.77(L) 3.36(L)  HGB  13.0 - 17.0 g/dL 9.9(L) 10.7(L) 9.6(L)  HCT 39.0 - 52.0 % 30.0(L) 33.0(L) 29.5(L)  PLT 150 - 400 K/uL 293 292 280  NEUTROABS 1.7 - 7.7 K/uL - - 5.4  LYMPHSABS 0.7 - 4.0 K/uL - - 1.4     There is no height or weight on file to calculate BMI.  Orders:  No orders of the defined types were placed in this encounter.  No orders of the defined types were placed in this encounter.    Procedures: No procedures performed  Clinical Data: No additional findings.  ROS:  All other systems negative, except as noted in the HPI. Review of Systems  Objective: Vital Signs: There were no vitals taken for this visit.  Specialty Comments:  No specialty comments available.  PMFS History: Patient Active Problem List   Diagnosis Date Noted    Abscess of bursa of left ankle 11/22/2020   Subacute osteomyelitis, left ankle and foot ([place])    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 ([place]) 04/12/2020   Acute encephalopathy 03/20/2020   SIRS (systemic inflammatory response syndrome) ([place]) 03/20/2020   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[place]) 03/20/2020   ARF (acute renal failure) ([place]) 03/20/2020   Midfoot ulcer, left, limited to breakdown of skin ([place]) 12/29/2018   Occipital stroke ([place]) 10/31/2018   DM2 (diabetes mellitus, type 2) ([place]) 10/31/2018   NSTEMI (non-ST elevated myocardial infarction) ([place]) 05/28/2018   Syncope 05/28/2018   HTN (hypertension) 05/28/2018   Hyperlipemia, mixed 05/28/2018   Rash and nonspecific skin eruption 12/16/2017   Pain in right hand 09/11/2017   Carpal tunnel syndrome, right upper limb 07/01/2017   Arthritis of carpometacarpal [facility]) joint of left thumb 12/30/2016   Midfoot skin ulcer, right, limited to breakdown of skin ([place]) 07/22/2016   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[place]) 09/23/2014   Osteomyelitis of ankle or foot, left, acute ([place]) 08/23/2014   Cellulitis 10/12/2012   Chest pain 10/12/2012   Tobacco abuse 10/12/2012   ABSCESS, AXILLA, LEFT 02/23/2008   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   CHEST WALL PAIN, ACUTE 12/03/2007   CANDIDIASIS, GLANS PENIS 09/16/2007   HEADACHE 08/04/2007   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 ([place]) 03/05/2007   Other and unspecified hyperlipidemia 03/05/2007   GOUT 03/05/2007   ERECTILE DYSFUNCTION 03/05/2007   EXTERNAL HEMORRHOIDS 03/05/2007   VENTRAL HERNIA 03/05/2007   FATTY LIVER DISEASE 03/05/2007   Osteoarthritis 03/05/2007   HEMORRHOIDS, INTERNAL 03/17/2005   COLONIC POLYPS, HX OF 03/17/2005   Past Medical History:  Diagnosis Date   Arthritis    Denies   Glaucoma    High cholesterol    Hypertension    denies   Osteomyelitis ([place])    Right great toe   Peripheral  vascular disease ([place])    diabetic  with osteomylitis   Pre-diabetes    Stroke (cerebrum) ([place]) 10/2018    Family History  Problem Relation Age of Onset   Diabetes Mellitus II Other    Anesthesia problems Neg Hx     Past Surgical History:  Procedure Laterality Date   AMPUTATION  07/23/2011   Procedure: AMPUTATION DIGIT;  Surgeon: Newt Minion, MD;  Location: [place];  Service: Orthopedics;  Laterality: Right;  Right Great Toe Amputation   AMPUTATION Right 09/18/2012   Procedure: Right Foot 2nd Ray Amputation;  Surgeon: Newt Minion, MD;  Location: [place];  Service: Orthopedics;  Laterality: Right;  Right Foot 2nd Ray  Amputation   AMPUTATION Right 10/14/2012   Procedure: AMPUTATION FOOT;  Surgeon: Newt Minion, MD;  Location: [place];  Service: Orthopedics;  Laterality: Right;  Right Foot Transmetatarsal Amputation   AMPUTATION Left 09/23/2014   Procedure: Left Foot Transmetatarsal Amputation;  Surgeon: Newt Minion, MD;  Location: [place];  Service: Orthopedics;  Laterality: Left;   AMPUTATION Left 11/22/2020   Procedure: LEFT BELOW KNEE AMPUTATION;  Surgeon: Newt Minion, MD;  Location: [place];  Service: Orthopedics;  Laterality: Left;   BACK SURGERY     lower   CARPAL TUNNEL RELEASE Right 01/30/2018   Procedure: RIGHT CARPAL TUNNEL RELEASE;  Surgeon: Newt Minion, MD;  Location: [facility];  Service: Orthopedics;  Laterality: Right;   CIRCUMCISION  2010   COLONOSCOPY     FOOT FRACTURE SURGERY Left 1970's   "broke it playing football" (10/12/2012)   HUMERUS FRACTURE SURGERY W/ IMPLANT Right 1960's   "put a plate in it" (2/77/8242)   I & D EXTREMITY Right 03/24/2020   Procedure: EXCISION RIGHT MEDIAL CUNEFORM;  Surgeon: Newt Minion, MD;  Location: [place] [place];  Service: Orthopedics;  Laterality: Right;   INCISION AND DRAINAGE OF WOUND Left 2006   "foot" (10/12/2012)   [place] SURGERY  2008   Social History   Occupational History   Not on file  Tobacco Use   Smoking status: Every Day     Pack years: 0.00    Types: Pipe   Smokeless tobacco: Never   Tobacco comments:    Uses 2-3 barrels a day.  Vaping Use   Vaping Use: Never used  Substance and Sexual Activity   Alcohol use: No    Alcohol/week: 0.0 standard drinks   Drug use: No   Sexual activity: Not Currently

## 2020-11-29 ENCOUNTER — Emergency Department (HOSPITAL_COMMUNITY)
Admission: EM | Admit: 2020-11-29 | Discharge: 2020-11-29 | Disposition: A | Discharge status: Home / Self Care | Payer: Medicare Other | Attending: Emergency Medicine | Admitting: Emergency Medicine

## 2020-11-29 ENCOUNTER — Encounter (HOSPITAL_COMMUNITY): Payer: Self-pay

## 2020-11-29 DIAGNOSIS — L539 Erythematous condition, unspecified: Secondary | ICD-10-CM | POA: Diagnosis not present

## 2020-11-29 DIAGNOSIS — I10 Essential (primary) hypertension: Secondary | ICD-10-CM | POA: Diagnosis not present

## 2020-11-29 DIAGNOSIS — H538 Other visual disturbances: Secondary | ICD-10-CM | POA: Diagnosis not present

## 2020-11-29 DIAGNOSIS — E11621 Type 2 diabetes mellitus with foot ulcer: Secondary | ICD-10-CM | POA: Diagnosis not present

## 2020-11-29 DIAGNOSIS — L97421 Non-pressure chronic ulcer of left heel and midfoot limited to breakdown of skin: Secondary | ICD-10-CM | POA: Insufficient documentation

## 2020-11-29 DIAGNOSIS — F17290 Nicotine dependence, other tobacco product, uncomplicated: Secondary | ICD-10-CM | POA: Insufficient documentation

## 2020-11-29 DIAGNOSIS — H539 Unspecified visual disturbance: Secondary | ICD-10-CM | POA: Diagnosis present

## 2020-11-29 DIAGNOSIS — H547 Unspecified visual loss: Secondary | ICD-10-CM | POA: Insufficient documentation

## 2020-11-29 DIAGNOSIS — Z7902 Long term (current) use of antithrombotics/antiplatelets: Secondary | ICD-10-CM | POA: Diagnosis not present

## 2020-11-29 NOTE — Telephone Encounter (Signed)
Valente David, RN sent to [employer], [place] Demaria Deeney,   I placed an urgent referral to our Care Guide on Friday to follow up with Mr. Stucki regarding transportation and meal concerns.  They may not have been able to get in touch with him yet but they should by the end of the week.  I called [facility] directly myself on Friday as well and they said they would reach out to him to start service over the weekend.  According to the niece, Mr. Nesheiwat has been very non-compliant and may not answer the phone when they call.  I asked about other family that would be able to help while she was out of town and she said there wasn't anyone else.  She and the hospital staff tried to get him to agree to SNF but he adamantly refused.  We will continue to work with him but of course the barrier is the need for a higher level of care that he is not agreeable to.  I hope this answers your question.  Feel free to contact me!   Valente David, [place], MSN  [place]  [phone]         Previous Messages    ----- Message -----  From: Pamella Pert, RMA  Sent: 11/27/2020  11:23 AM EDT  To: Valente David, RN  Subject: question...Marland KitchenMarland Kitchen                                   Hello there,   My name [place] Mahayla Haddaway and I am Dr. Jess Barters medical assistant here in the office. We saw Mr. Lenderman today in the office and I see your note that you have reached out to the pt and discussed transportation needs and meal resources. Has anything been confirmed or assigned for the pt as of yet? I know that he is home alone and see that you also have spoken to the pt's niece and that she is out of the country and not able to care for the pt. [name] has not reached out to the pt as of yet and he came in the office today without his wound vac attached and did not know where the canister was. Dr. Sharol Given wanted me to follow up with them for a [place] date and also try to get meals on wheels for the pt but I did not want to disrupt  anything that you may be working on currently for the pt.  If you would not mind letting me know I am happy to help do whatever the patient needs.

## 2020-11-29 NOTE — Discharge Instructions (Addendum)
Please continue to see your primary care doctor for your vision related issues.

## 2020-11-29 NOTE — ED Notes (Signed)
[facility] called to transport pt home 

## 2020-11-29 NOTE — ED Notes (Signed)
Family at bedside. 

## 2020-11-29 NOTE — ED Provider Notes (Signed)
[facility] DEPT Provider Note   CSN: [id number] Arrival date & time: 11/29/20  1312     History No chief complaint on file.   Imad Shostak is a 80 y.o. male.  HPI     80 y.o with male with hx of glaucoma, stroke, recent osteo s/p amputation on 6/13.  He comes in with cc of vision disturbance. Pt reports that his vision has been slowly declining over the past 5 years. More recently his vision has continued to worsen and he wants to get a second opinion. Pt used to see Dr. Katy Fitch and has seen [facility] as well.   Patient had a recent below the knee amputation, he reports that he has no complaints about his surgery.  No eye pain. Pt lives by himself. POA and I talked over the phone.  She currently is in [place].  She informs me that patient frequently comes to the ER when he is upset about his vision, he has seen other doctors in the past and has glaucoma that is slowly progressing.  She had tried to place patient in a facility after the surgery, but patient declined.   Past Medical History:  Diagnosis Date   Arthritis    Denies   Glaucoma    High cholesterol    Hypertension    denies   Osteomyelitis ([place])    Right great toe   Peripheral vascular disease ([place])    diabetic  with osteomylitis   Pre-diabetes    Stroke (cerebrum) (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 ([place]) 04/12/2020   Acute encephalopathy 03/20/2020   SIRS (systemic inflammatory response syndrome) ([place]) 03/20/2020   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[place]) 03/20/2020   ARF (acute renal failure) ([place]) 03/20/2020   Midfoot ulcer, left, limited to breakdown of skin ([place]) 12/29/2018   Occipital stroke ([place]) 10/31/2018   DM2 (diabetes mellitus, type 2) ([place]) 10/31/2018   NSTEMI (non-ST elevated myocardial  infarction) ([place]) 05/28/2018   Syncope 05/28/2018   HTN (hypertension) 05/28/2018   Hyperlipemia, mixed 05/28/2018   Rash and nonspecific skin eruption 12/16/2017   Pain in right hand 09/11/2017   Carpal tunnel syndrome, right upper limb 07/01/2017   Arthritis of carpometacarpal [facility]) joint of left thumb 12/30/2016   Midfoot skin ulcer, right, limited to breakdown of skin ([place]) 07/22/2016   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[place]) 09/23/2014   Osteomyelitis of ankle or foot, left, acute ([place]) 08/23/2014   Cellulitis 10/12/2012   Chest pain 10/12/2012   Tobacco abuse 10/12/2012   ABSCESS, AXILLA, LEFT 02/23/2008   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   CHEST WALL PAIN, ACUTE 12/03/2007   CANDIDIASIS, GLANS PENIS 09/16/2007   HEADACHE 08/04/2007   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 ([place]) 03/05/2007   Other and unspecified hyperlipidemia 03/05/2007   GOUT 03/05/2007   ERECTILE DYSFUNCTION 03/05/2007   EXTERNAL HEMORRHOIDS 03/05/2007   VENTRAL HERNIA 03/05/2007   FATTY LIVER DISEASE 03/05/2007   Osteoarthritis 03/05/2007   HEMORRHOIDS, INTERNAL 03/17/2005   COLONIC POLYPS, HX OF 03/17/2005    Past Surgical History:  Procedure Laterality Date   AMPUTATION  07/23/2011   Procedure: AMPUTATION DIGIT;  Surgeon: Newt Minion, MD;  Location: [facility];  Service: Orthopedics;  Laterality:  Right;  Right Great Toe Amputation   AMPUTATION Right 09/18/2012   Procedure: Right Foot 2nd Ray Amputation;  Surgeon: Newt Minion, MD;  Location: [facility];  Service: Orthopedics;  Laterality: Right;  Right Foot 2nd Ray Amputation   AMPUTATION Right 10/14/2012   Procedure: AMPUTATION FOOT;  Surgeon: Newt Minion, MD;  Location: [facility];  Service: Orthopedics;  Laterality: Right;  Right Foot Transmetatarsal Amputation   AMPUTATION Left 09/23/2014   Procedure: Left Foot Transmetatarsal Amputation;  Surgeon: Newt Minion, MD;  Location: [facility];   Service: Orthopedics;  Laterality: Left;   AMPUTATION Left 11/22/2020   Procedure: LEFT BELOW KNEE AMPUTATION;  Surgeon: Newt Minion, MD;  Location: [facility];  Service: Orthopedics;  Laterality: Left;   BACK SURGERY     lower   CARPAL TUNNEL RELEASE Right 01/30/2018   Procedure: RIGHT CARPAL TUNNEL RELEASE;  Surgeon: Newt Minion, MD;  Location: [facility];  Service: Orthopedics;  Laterality: Right;   CIRCUMCISION  2010   COLONOSCOPY     FOOT FRACTURE SURGERY Left 1970's   "broke it playing football" (10/12/2012)   HUMERUS FRACTURE SURGERY W/ IMPLANT Right 1960's   "put a plate in it" (0/93/2355)   I & D EXTREMITY Right 03/24/2020   Procedure: EXCISION RIGHT MEDIAL CUNEFORM;  Surgeon: Newt Minion, MD;  Location: [facility];  Service: Orthopedics;  Laterality: Right;   INCISION AND DRAINAGE OF WOUND Left 2006   "foot" (10/12/2012)   [place] SURGERY  2008       Family History  Problem Relation Age of Onset   Diabetes Mellitus II Other    Anesthesia problems Neg Hx     Social History   Tobacco Use   Smoking status: Every Day    Pack years: 0.00    Types: Pipe   Smokeless tobacco: Never   Tobacco comments:    Uses 2-3 barrels a day.  Vaping Use   Vaping Use: Never used  Substance Use Topics   Alcohol use: No    Alcohol/week: 0.0 standard drinks   Drug use: No    Home Medications Prior to Admission medications   Medication Sig Start Date End Date Taking? Authorizing Provider  atorvastatin (LIPITOR) 80 MG tablet Take 80 mg by mouth daily.    [provider]  brimonidine (ALPHAGAN) 0.2 % ophthalmic solution Place 1 drop into the right eye 2 (two) times daily.    [provider]  clopidogrel (PLAVIX) 75 MG tablet Take 1 tablet (75 mg total) by mouth daily. 11/01/18   Mariel Aloe, MD  cycloSPORINE (RESTASIS) 0.05 % ophthalmic emulsion Place 1 drop into both eyes 2 (two) times daily.    [provider]  Dorzolamide HCl-Timolol Mal PF 2-0.5 % SOLN Place  1 drop into both eyes 2 (two) times daily. 03/28/20   Aline August, MD  dorzolamidel-timolol (COSOPT) 22.3-6.8 MG/ML SOLN ophthalmic solution Place 1 drop into the right eye 2 (two) times daily.    [provider]  gabapentin (NEURONTIN) 300 MG capsule Take 1 capsule (300 mg total) by mouth at bedtime. 08/11/20   Newt Minion, MD  hydroxypropyl methylcellulose / hypromellose (ISOPTO TEARS / GONIOVISC) 2.5 % ophthalmic solution Place 1 drop into both eyes 3 (three) times daily as needed for dry eyes. 09/01/20   Valarie Merino, MD  ketorolac (ACULAR) 0.5 % ophthalmic solution Place 1 drop into both eyes daily.    [provider]  oxyCODONE-acetaminophen (PERCOCET/ROXICET) 5-325  MG tablet Take 1 tablet by mouth every 4 (four) hours as needed. 11/23/20   Newt Minion, MD  traMADol (ULTRAM) 50 MG tablet Take 1 tablet (50 mg total) by mouth every 6 (six) hours as needed for moderate pain. 03/28/20   Persons, Bevely Palmer, PA    Allergies    Patient has no known allergies.  Review of Systems   Review of Systems  Constitutional:  Negative for activity change.  Eyes:  Positive for visual disturbance.  Musculoskeletal:  Negative for arthralgias and myalgias.  Skin:  Positive for wound.   Physical Exam Updated Vital Signs BP 130/73 (BP Location: Left Arm)   Pulse 73   Temp 98.6 F (37 C) (Oral)   Resp 18   SpO2 100%   Physical Exam Vitals and nursing note reviewed.  Constitutional:      Appearance: He is well-developed.  HENT:     Head: Atraumatic.  Eyes:     Comments: Pt has hard time with bedside finger counting accurately consistently.  He is able to tell in the hands waving at them however.  Cardiovascular:     Rate and Rhythm: Normal rate.  Pulmonary:     Effort: Pulmonary effort is normal.  Musculoskeletal:        General: No tenderness.     Cervical back: Neck supple.  Skin:    General: Skin is warm.     Findings: Erythema present.     Comments: Patient  has right BKA, staples in place, no warmth to touch, no bruising, no drainage  Neurological:     Mental Status: He is alert and oriented to person, place, and time.    ED Results / Procedures / Treatments   Labs (all labs ordered are listed, but only abnormal results are displayed) Labs Reviewed - No data to display  EKG None  Radiology No results found.  Procedures Procedures   Medications Ordered in ED Medications - No data to display  ED Course  I have reviewed the triage vital signs and the nursing notes.  Pertinent labs & imaging results that were available during my care of the patient were reviewed by me and considered in my medical decision making (see chart for details).    MDM Rules/Calculators/A&P                          Patient comes in a chief complaint of vision disturbance.  He recently had right BKA.  The wound site looks clean.  Patient's primary complaint is vision change.  He has no history of glaucoma and reports slowly progressing loss of vision.  No eye pain.  Reviewed patient's previous ER visits, for similar complaints.  It does not appear that there is an acute emergency.  I discussed the case with patient's niece, who is the medical POA and currently in [place].  She informs me that patient has had previous visit for eye complaints and that he has been assessed by ophthalmologist in addition to Dr. Katy Fitch and he has poor prognosis as far as his vision is concerned.  I asked patient if he wants to be placed, as he is living by himself and his vision is getting worse and he had recent surgery.  He declined.  He got upset when the question was asked.  Niece also confirms hesitation and resistance from the patient for the same after his recent surgery.  He is stable for discharge. PTAR called.  Final Clinical Impression(s) / ED Diagnoses Final diagnoses:  Vision loss    Rx / DC Orders ED Discharge Orders     None        Varney Biles,  MD 11/29/20 1936

## 2020-11-29 NOTE — ED Notes (Signed)
ED Provider at bedside. 

## 2020-11-29 NOTE — ED Triage Notes (Signed)
Pt arrived via EMS, from home, c/o vision issues x3-4 months. Has been seen by PCP for same, at [facility] for same and had surgery to try to fix vision issue, no result.

## 2020-11-30 ENCOUNTER — Telehealth: Payer: Self-pay | Admitting: Orthopedic Surgery

## 2020-11-30 DIAGNOSIS — K760 Fatty (change of) liver, not elsewhere classified: Secondary | ICD-10-CM | POA: Diagnosis not present

## 2020-11-30 DIAGNOSIS — K644 Residual hemorrhoidal skin tags: Secondary | ICD-10-CM | POA: Diagnosis not present

## 2020-11-30 DIAGNOSIS — I10 Essential (primary) hypertension: Secondary | ICD-10-CM | POA: Diagnosis not present

## 2020-11-30 DIAGNOSIS — I252 Old myocardial infarction: Secondary | ICD-10-CM | POA: Diagnosis not present

## 2020-11-30 DIAGNOSIS — Z89431 Acquired absence of right foot: Secondary | ICD-10-CM | POA: Diagnosis not present

## 2020-11-30 DIAGNOSIS — Z89512 Acquired absence of left leg below knee: Secondary | ICD-10-CM | POA: Diagnosis not present

## 2020-11-30 DIAGNOSIS — M1812 Unilateral primary osteoarthritis of first carpometacarpal joint, left hand: Secondary | ICD-10-CM | POA: Diagnosis not present

## 2020-11-30 DIAGNOSIS — K449 Diaphragmatic hernia without obstruction or gangrene: Secondary | ICD-10-CM | POA: Diagnosis not present

## 2020-11-30 DIAGNOSIS — Z8673 Personal history of transient ischemic attack (TIA), and cerebral infarction without residual deficits: Secondary | ICD-10-CM | POA: Diagnosis not present

## 2020-11-30 DIAGNOSIS — Z4781 Encounter for orthopedic aftercare following surgical amputation: Secondary | ICD-10-CM | POA: Diagnosis not present

## 2020-11-30 DIAGNOSIS — Z9181 History of falling: Secondary | ICD-10-CM | POA: Diagnosis not present

## 2020-11-30 DIAGNOSIS — M109 Gout, unspecified: Secondary | ICD-10-CM | POA: Diagnosis not present

## 2020-11-30 DIAGNOSIS — B0229 Other postherpetic nervous system involvement: Secondary | ICD-10-CM | POA: Diagnosis not present

## 2020-11-30 DIAGNOSIS — E1151 Type 2 diabetes mellitus with diabetic peripheral angiopathy without gangrene: Secondary | ICD-10-CM | POA: Diagnosis not present

## 2020-11-30 DIAGNOSIS — Z87891 Personal history of nicotine dependence: Secondary | ICD-10-CM | POA: Diagnosis not present

## 2020-11-30 DIAGNOSIS — H539 Unspecified visual disturbance: Secondary | ICD-10-CM | POA: Diagnosis not present

## 2020-11-30 DIAGNOSIS — E782 Mixed hyperlipidemia: Secondary | ICD-10-CM | POA: Diagnosis not present

## 2020-11-30 DIAGNOSIS — H409 Unspecified glaucoma: Secondary | ICD-10-CM | POA: Diagnosis not present

## 2020-11-30 DIAGNOSIS — Z7902 Long term (current) use of antithrombotics/antiplatelets: Secondary | ICD-10-CM | POA: Diagnosis not present

## 2020-11-30 NOTE — Telephone Encounter (Signed)
Phy. Ther. Neomia Dear) called on behalf of the pt stating they will be taking on the pt for services, one day a week for 5 weeks. The best call back number for her if there are any questions is [phone].

## 2020-12-01 ENCOUNTER — Other Ambulatory Visit: Payer: Self-pay | Admitting: *Deleted

## 2020-12-01 NOTE — Patient Outreach (Signed)
[facility]) Care Management  12/01/2020  [name] [birth year] [id number]   Outgoing call placed to member to complete initial assessment.  Although assessment was completed, he is not a very good historian.  He attended MD follow up on 6/13, this care manager inquired about how he got there, he was not able to verbalize that info.  Inquired about how he will get to next appointment, also not able to provide that info.  Per chart, Home health has started, inquired about therapy sessions, state "I guess they've been here, I don't know."  Niece will be back in town next week, will call to discuss plan of care.    Goals Addressed             This Visit's Progress    [facility] - Find Help in My Community   On track    Timeframe:  Short-Term Goal Priority:  Medium Start Date:          6/10                   Expected End Date:  7/10                     Barriers: Health Behaviors Knowledge Support System  Follow Up Date 12/01/2020   - follow-up on any referrals for help I am given    Why is this important?   Knowing how and where to find help for yourself or family in your neighborhood and community is an important skill.  You will want to take some steps to learn how.    Notes:   6/10 - Referral placed to Care Guide for transportation and meal resources  6/17 - Confirmed with care guide that meals has been set up.  Per chart, Home health has also started      [facility] - Make and Keep All Appointments   On track    Timeframe:  Long-Range Goal Priority:  High Start Date:         6/10                    Expected End Date:    9/10                    Follow Up Date 6/17  Barriers: Health Behaviors Support System Transportation    - call to cancel if needed - keep a calendar with appointment dates    Why is this important?   Part of staying healthy is seeing the doctor for follow-up care.  If you forget your appointments, there are some things you can do to  stay on track.    Notes:   6/10 - Reviewed upcoming appointment with member and niece, discussed options for transportation resources  6/17 - attended ortho follow up on 6/13, has another one scheduled for 6/27.          [name], [place], MSN [place] [phone]

## 2020-12-04 DIAGNOSIS — Z8673 Personal history of transient ischemic attack (TIA), and cerebral infarction without residual deficits: Secondary | ICD-10-CM | POA: Diagnosis not present

## 2020-12-04 DIAGNOSIS — E1151 Type 2 diabetes mellitus with diabetic peripheral angiopathy without gangrene: Secondary | ICD-10-CM | POA: Diagnosis not present

## 2020-12-04 DIAGNOSIS — B0229 Other postherpetic nervous system involvement: Secondary | ICD-10-CM | POA: Diagnosis not present

## 2020-12-04 DIAGNOSIS — H409 Unspecified glaucoma: Secondary | ICD-10-CM | POA: Diagnosis not present

## 2020-12-04 DIAGNOSIS — Z89431 Acquired absence of right foot: Secondary | ICD-10-CM | POA: Diagnosis not present

## 2020-12-04 DIAGNOSIS — Z9181 History of falling: Secondary | ICD-10-CM | POA: Diagnosis not present

## 2020-12-04 DIAGNOSIS — I252 Old myocardial infarction: Secondary | ICD-10-CM | POA: Diagnosis not present

## 2020-12-04 DIAGNOSIS — M109 Gout, unspecified: Secondary | ICD-10-CM | POA: Diagnosis not present

## 2020-12-04 DIAGNOSIS — Z87891 Personal history of nicotine dependence: Secondary | ICD-10-CM | POA: Diagnosis not present

## 2020-12-04 DIAGNOSIS — I10 Essential (primary) hypertension: Secondary | ICD-10-CM | POA: Diagnosis not present

## 2020-12-04 DIAGNOSIS — H539 Unspecified visual disturbance: Secondary | ICD-10-CM | POA: Diagnosis not present

## 2020-12-04 DIAGNOSIS — K644 Residual hemorrhoidal skin tags: Secondary | ICD-10-CM | POA: Diagnosis not present

## 2020-12-04 DIAGNOSIS — Z89512 Acquired absence of left leg below knee: Secondary | ICD-10-CM | POA: Diagnosis not present

## 2020-12-04 DIAGNOSIS — Z7902 Long term (current) use of antithrombotics/antiplatelets: Secondary | ICD-10-CM | POA: Diagnosis not present

## 2020-12-04 DIAGNOSIS — K760 Fatty (change of) liver, not elsewhere classified: Secondary | ICD-10-CM | POA: Diagnosis not present

## 2020-12-04 DIAGNOSIS — Z4781 Encounter for orthopedic aftercare following surgical amputation: Secondary | ICD-10-CM | POA: Diagnosis not present

## 2020-12-04 DIAGNOSIS — K449 Diaphragmatic hernia without obstruction or gangrene: Secondary | ICD-10-CM | POA: Diagnosis not present

## 2020-12-04 DIAGNOSIS — M1812 Unilateral primary osteoarthritis of first carpometacarpal joint, left hand: Secondary | ICD-10-CM | POA: Diagnosis not present

## 2020-12-04 DIAGNOSIS — E782 Mixed hyperlipidemia: Secondary | ICD-10-CM | POA: Diagnosis not present

## 2020-12-06 DIAGNOSIS — E1142 Type 2 diabetes mellitus with diabetic polyneuropathy: Secondary | ICD-10-CM | POA: Diagnosis not present

## 2020-12-06 DIAGNOSIS — M19041 Primary osteoarthritis, right hand: Secondary | ICD-10-CM | POA: Diagnosis not present

## 2020-12-06 DIAGNOSIS — E11621 Type 2 diabetes mellitus with foot ulcer: Secondary | ICD-10-CM | POA: Diagnosis not present

## 2020-12-06 DIAGNOSIS — E785 Hyperlipidemia, unspecified: Secondary | ICD-10-CM | POA: Diagnosis not present

## 2020-12-06 DIAGNOSIS — N179 Acute kidney failure, unspecified: Secondary | ICD-10-CM | POA: Diagnosis not present

## 2020-12-06 DIAGNOSIS — M19049 Primary osteoarthritis, unspecified hand: Secondary | ICD-10-CM | POA: Diagnosis not present

## 2020-12-06 DIAGNOSIS — E1169 Type 2 diabetes mellitus with other specified complication: Secondary | ICD-10-CM | POA: Diagnosis not present

## 2020-12-07 ENCOUNTER — Other Ambulatory Visit: Payer: Self-pay | Admitting: *Deleted

## 2020-12-07 NOTE — Patient Outreach (Signed)
[facility]) Care Management  12/07/2020  Fenris Cauble 1940/12/10 [id number]   Outgoing call placed to member's niece to discuss plan of care.  Confirms member is allowing home health in for sessions.  He is doing better with managing independently at home.  She report member had been calling EMS to the home when he had to go to the bathroom because he wasn't able to get there on his own.  He has now been educated by OT to perform this task.    Advised that member has follow up with ortho on 6/27, niece wasn't aware.  State he used the ambulance services to get to his last appointment.  Discussed having [employer] transportation resources, state she will call today to secure ride for next week.    Report she has worked with her family to be able to check on member more often, especially over the next couple weeks while she is out of town.  Someone will be brining him meals and making sure he is taking his meds.  She does acknowledge that he is still in need of additional help, but member does not have Medicaid.  This care manager will follow up with Care Guide regarding application process.  Will follow up with member/niece within the next 2 weeks.  Valente David, [place], MSN [place] [phone]

## 2020-12-11 ENCOUNTER — Encounter: Payer: Self-pay | Admitting: Orthopedic Surgery

## 2020-12-11 ENCOUNTER — Telehealth: Payer: Self-pay | Admitting: *Deleted

## 2020-12-11 ENCOUNTER — Ambulatory Visit (INDEPENDENT_AMBULATORY_CARE_PROVIDER_SITE_OTHER): Payer: Medicare Other | Admitting: Orthopedic Surgery

## 2020-12-11 ENCOUNTER — Other Ambulatory Visit: Payer: Self-pay

## 2020-12-11 DIAGNOSIS — Z8673 Personal history of transient ischemic attack (TIA), and cerebral infarction without residual deficits: Secondary | ICD-10-CM | POA: Diagnosis not present

## 2020-12-11 DIAGNOSIS — K760 Fatty (change of) liver, not elsewhere classified: Secondary | ICD-10-CM | POA: Diagnosis not present

## 2020-12-11 DIAGNOSIS — Z89431 Acquired absence of right foot: Secondary | ICD-10-CM | POA: Diagnosis not present

## 2020-12-11 DIAGNOSIS — Z87891 Personal history of nicotine dependence: Secondary | ICD-10-CM | POA: Diagnosis not present

## 2020-12-11 DIAGNOSIS — M109 Gout, unspecified: Secondary | ICD-10-CM | POA: Diagnosis not present

## 2020-12-11 DIAGNOSIS — B0229 Other postherpetic nervous system involvement: Secondary | ICD-10-CM | POA: Diagnosis not present

## 2020-12-11 DIAGNOSIS — H539 Unspecified visual disturbance: Secondary | ICD-10-CM | POA: Diagnosis not present

## 2020-12-11 DIAGNOSIS — Z4781 Encounter for orthopedic aftercare following surgical amputation: Secondary | ICD-10-CM | POA: Diagnosis not present

## 2020-12-11 DIAGNOSIS — K644 Residual hemorrhoidal skin tags: Secondary | ICD-10-CM | POA: Diagnosis not present

## 2020-12-11 DIAGNOSIS — H409 Unspecified glaucoma: Secondary | ICD-10-CM | POA: Diagnosis not present

## 2020-12-11 DIAGNOSIS — M1812 Unilateral primary osteoarthritis of first carpometacarpal joint, left hand: Secondary | ICD-10-CM | POA: Diagnosis not present

## 2020-12-11 DIAGNOSIS — Z9181 History of falling: Secondary | ICD-10-CM | POA: Diagnosis not present

## 2020-12-11 DIAGNOSIS — K449 Diaphragmatic hernia without obstruction or gangrene: Secondary | ICD-10-CM | POA: Diagnosis not present

## 2020-12-11 DIAGNOSIS — Z7902 Long term (current) use of antithrombotics/antiplatelets: Secondary | ICD-10-CM | POA: Diagnosis not present

## 2020-12-11 DIAGNOSIS — I10 Essential (primary) hypertension: Secondary | ICD-10-CM | POA: Diagnosis not present

## 2020-12-11 DIAGNOSIS — E782 Mixed hyperlipidemia: Secondary | ICD-10-CM | POA: Diagnosis not present

## 2020-12-11 DIAGNOSIS — I252 Old myocardial infarction: Secondary | ICD-10-CM | POA: Diagnosis not present

## 2020-12-11 DIAGNOSIS — E1151 Type 2 diabetes mellitus with diabetic peripheral angiopathy without gangrene: Secondary | ICD-10-CM | POA: Diagnosis not present

## 2020-12-11 DIAGNOSIS — Z89512 Acquired absence of left leg below knee: Secondary | ICD-10-CM

## 2020-12-11 NOTE — Telephone Encounter (Signed)
   Telephone encounter was:  Unsuccessful.  12/11/2020 Name: [name] MRN: [id number] DOB: 1940/10/24  Unsuccessful outbound call made today to assist with:  Case manager reached out to ask me to provide more information to patient niece about medicaid there was no answer,left message   Outreach Attempt:  1st Attempt  A HIPAA compliant voice message was left requesting a return call.  Instructed patient to call back at   Instructed patient to call back at [phone]  at their earliest convenience.   [place], Care Management  [phone] [address] , [place] [address] Email : [email or web address]

## 2020-12-11 NOTE — Progress Notes (Signed)
Office Visit Note   Patient: [name]           Date of Birth: Nov 02, 1940           MRN: [id number] Visit Date: 12/11/2020              Requested by: [name], MD [address] [place],  [place] [address] PCP: [name], MD  No chief complaint on file.     HPI: Patient is a 80 year old gentleman who is 3 weeks status post a left below the knee amputation he is also remotely had a right transmetatarsal amputation.  Patient was set up with [employer] for a stump shrinker and a limb protector patient does not know where they are.  Patient has no complaints.  Assessment & Plan: Visit Diagnoses:  1. S/P BKA (below knee amputation) unilateral, left ([place])     Plan: We will harvest the staples today a prescription was given to the patient for [employer] for a shrinker as well as a K2 level prosthesis on the left.  Follow-Up Instructions: Return in about 3 weeks (around 01/01/2021).   Ortho Exam  Patient is alert, oriented, no adenopathy, well-dressed, normal affect, normal respiratory effort. Examination patient's left transtibial amputation is healed well there is no redness no swelling no drainage the wound edges are well approximated no signs of infection.  Patient's right transmetatarsal amputation is also well-healed and plantar-grade foot.  Patient is a new left transtibial  amputee.  Patient's current comorbidities are not expected to impact the ability to function with the prescribed prosthesis. Patient verbally communicates a strong desire to use a prosthesis. Patient currently requires mobility aids to ambulate without a prosthesis.  Expects not to use mobility aids with a new prosthesis.  Patient is a K2 level ambulator that will use a prosthesis to walk around their home and the community over low level environmental barriers.      Imaging: No results found. No images are attached to the encounter.  Labs: Lab Results  Component  Value Date   HGBA1C 5.7 (H) 03/21/2020   HGBA1C 5.4 11/01/2018   HGBA1C 5.2 05/29/2018   ESRSEDRATE 50 (H) 03/20/2020   ESRSEDRATE 50 (H) 03/17/2020   ESRSEDRATE 34 (H) 12/17/2017   CRP 13.5 (H) 03/25/2020   CRP 0.8 03/17/2020   CRP 17.1 (H) 12/17/2017   REPTSTATUS 03/25/2020 FINAL 03/19/2020   REPTSTATUS 03/25/2020 FINAL 03/19/2020   REPTSTATUS 03/21/2020 FINAL 03/19/2020   GRAMSTAIN  03/17/2020    FEW WBC PRESENT, PREDOMINANTLY PMN FEW GRAM POSITIVE COCCI    CULT  03/19/2020    NO GROWTH 5 DAYS Performed at [facility], [place] [address] [place], [place] [address]    CULT  03/19/2020    NO GROWTH 5 DAYS Performed at [facility] [address] [place], [place] [address]    CULT (A) 03/19/2020    <10,000 COLONIES/mL INSIGNIFICANT GROWTH Performed at [facility] [address] [place],  [address]    LABORGA METHICILLIN RESISTANT STAPHYLOCOCCUS AUREUS 03/17/2020     Lab Results  Component Value Date   ALBUMIN 3.5 03/19/2020   ALBUMIN 3.7 02/05/2020   ALBUMIN 3.2 (L) 10/31/2018    Lab Results  Component Value Date   MG 1.9 03/27/2020   MG 1.6 (L) 03/26/2020   MG 1.4 (L) 03/25/2020   No results found for: VD25OH  No results found for: PREALBUMIN CBC EXTENDED Latest Ref Rng & Units 11/23/2020  11/22/2020 03/27/2020  WBC 4.0 - 10.5 K/uL 12.6(H) 8.3 8.9  RBC 4.22 - 5.81 MIL/uL 3.52(L) 3.77(L) 3.36(L)  HGB 13.0 - 17.0 g/dL 9.9(L) 10.7(L) 9.6(L)  HCT 39.0 - 52.0 % 30.0(L) 33.0(L) 29.5(L)  PLT 150 - 400 K/uL 293 292 280  NEUTROABS 1.7 - 7.7 K/uL - - 5.4  LYMPHSABS 0.7 - 4.0 K/uL - - 1.4     There is no height or weight on file to calculate BMI.  Orders:  No orders of the defined types were placed in this encounter.  No orders of the defined types were placed in this encounter.    Procedures: No procedures performed  Clinical Data: No additional findings.  ROS:  All other systems negative, except as noted in the HPI. Review of  Systems  Objective: Vital Signs: There were no vitals taken for this visit.  Specialty Comments:  No specialty comments available.  PMFS History: Patient Active Problem List   Diagnosis Date Noted   Abscess of bursa of left ankle 11/22/2020   Subacute osteomyelitis, left ankle and foot ([place])    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 ([place]) 04/12/2020   Acute encephalopathy 03/20/2020   SIRS (systemic inflammatory response syndrome) ([place]) 03/20/2020   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[place]) 03/20/2020   ARF (acute renal failure) ([place]) 03/20/2020   Midfoot ulcer, left, limited to breakdown of skin ([place]) 12/29/2018   Occipital stroke ([place]) 10/31/2018   DM2 (diabetes mellitus, type 2) ([place]) 10/31/2018   NSTEMI (non-ST elevated myocardial infarction) ([place]) 05/28/2018   Syncope 05/28/2018   HTN (hypertension) 05/28/2018   Hyperlipemia, mixed 05/28/2018   Rash and nonspecific skin eruption 12/16/2017   Pain in right hand 09/11/2017   Carpal tunnel syndrome, right upper limb 07/01/2017   Arthritis of carpometacarpal [facility]) joint of left thumb 12/30/2016   Midfoot skin ulcer, right, limited to breakdown of skin ([place]) 07/22/2016   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[place]) 09/23/2014   Osteomyelitis of ankle or foot, left, acute ([place]) 08/23/2014   Cellulitis 10/12/2012   Chest pain 10/12/2012   Tobacco abuse 10/12/2012   ABSCESS, AXILLA, LEFT 02/23/2008   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   CHEST WALL PAIN, ACUTE 12/03/2007   CANDIDIASIS, GLANS PENIS 09/16/2007   HEADACHE 08/04/2007   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 ([place]) 03/05/2007   Other and unspecified hyperlipidemia 03/05/2007   GOUT 03/05/2007   ERECTILE DYSFUNCTION 03/05/2007   EXTERNAL HEMORRHOIDS 03/05/2007   VENTRAL HERNIA 03/05/2007   FATTY LIVER DISEASE 03/05/2007   Osteoarthritis 03/05/2007   HEMORRHOIDS, INTERNAL 03/17/2005   COLONIC  POLYPS, HX OF 03/17/2005   Past Medical History:  Diagnosis Date   Arthritis    Denies   Glaucoma    High cholesterol    Hypertension    denies   Osteomyelitis ([place])    Right great toe   Peripheral vascular disease ([place])    diabetic  with osteomylitis   Pre-diabetes    Stroke (cerebrum) ([place]) 10/2018    Family History  Problem Relation Age of Onset   Diabetes Mellitus II Other    Anesthesia problems Neg Hx     Past Surgical History:  Procedure Laterality Date   AMPUTATION  07/23/2011   Procedure: AMPUTATION DIGIT;  Surgeon: Newt Minion, MD;  Location: [facility];  Service: Orthopedics;  Laterality: Right;  Right Great Toe Amputation   AMPUTATION Right 09/18/2012   Procedure: Right Foot 2nd Ray  Amputation;  Surgeon: Newt Minion, MD;  Location: [facility];  Service: Orthopedics;  Laterality: Right;  Right Foot 2nd Ray Amputation   AMPUTATION Right 10/14/2012   Procedure: AMPUTATION FOOT;  Surgeon: Newt Minion, MD;  Location: [facility];  Service: Orthopedics;  Laterality: Right;  Right Foot Transmetatarsal Amputation   AMPUTATION Left 09/23/2014   Procedure: Left Foot Transmetatarsal Amputation;  Surgeon: Newt Minion, MD;  Location: [facility];  Service: Orthopedics;  Laterality: Left;   AMPUTATION Left 11/22/2020   Procedure: LEFT BELOW KNEE AMPUTATION;  Surgeon: Newt Minion, MD;  Location: [facility];  Service: Orthopedics;  Laterality: Left;   BACK SURGERY     lower   CARPAL TUNNEL RELEASE Right 01/30/2018   Procedure: RIGHT CARPAL TUNNEL RELEASE;  Surgeon: Newt Minion, MD;  Location: [facility];  Service: Orthopedics;  Laterality: Right;   CIRCUMCISION  2010   COLONOSCOPY     FOOT FRACTURE SURGERY Left 1970's   "broke it playing football" (10/12/2012)   HUMERUS FRACTURE SURGERY W/ IMPLANT Right 1960's   "put a plate in it" (0/08/4915)   I & D EXTREMITY Right 03/24/2020   Procedure: EXCISION RIGHT MEDIAL CUNEFORM;  Surgeon: Newt Minion, MD;  Location: [facility];  Service: Orthopedics;  Laterality:  Right;   INCISION AND DRAINAGE OF WOUND Left 2006   "foot" (10/12/2012)   [place] SURGERY  2008   Social History   Occupational History   Not on file  Tobacco Use   Smoking status: Every Day    Pack years: 0.00    Types: Pipe   Smokeless tobacco: Never   Tobacco comments:    Uses 2-3 barrels a day.  Vaping Use   Vaping Use: Never used  Substance and Sexual Activity   Alcohol use: No    Alcohol/week: 0.0 standard drinks   Drug use: No   Sexual activity: Not Currently

## 2020-12-13 ENCOUNTER — Other Ambulatory Visit: Payer: Self-pay | Admitting: *Deleted

## 2020-12-13 NOTE — Patient Outreach (Signed)
[facility]) Care Management  12/13/2020  Faraaz Wolin 1940/08/26 [id number]   Outgoing call place to member, state he is doing well.  Report daughter and niece has been helping as well as increasing strength on his own with therapy sessions.  Denies any urgent concerns, encouraged to contact this care manager with questions.  Agrees to follow up within the next month.   Goals Addressed             This Visit's Progress    THN - Find Help in My Community   On track    Timeframe:  Short-Term Goal Priority:  Medium Start Date:          6/10                   Expected End Date:  7/10                     Barriers: Health Behaviors Knowledge Support System  Follow Up Date 12/01/2020   - follow-up on any referrals for help I am given    Why is this important?   Knowing how and where to find help for yourself or family in your neighborhood and community is an important skill.  You will want to take some steps to learn how.    Notes:   6/10 - Referral placed to Care Guide for transportation and meal resources  6/17 - Confirmed with care guide that meals has been set up.  Per chart, Home health has also started  6/29 - Report HH remains involved.  Niece has enlisted the help of friends/family to help care for member with meals, etc.       THN - Make and Keep All Appointments   On track    Timeframe:  Long-Range Goal Priority:  High Start Date:         6/10                    Expected End Date:    9/10                    Follow Up Date 6/17  Barriers: Health Behaviors Support System Transportation    - call to cancel if needed - keep a calendar with appointment dates    Why is this important?   Part of staying healthy is seeing the doctor for follow-up care.  If you forget your appointments, there are some things you can do to stay on track.    Notes:   6/10 - Reviewed upcoming appointment with member and niece, discussed options for  transportation resources  6/17 - attended ortho follow up on 6/13, has another one scheduled for 6/27.    6/29 - Attended follow up on 6/27, has staples removed, denies any open areas or drainage.  Next appointment scheduled for 7/18, member made aware        Valente David, RN, MSN [place] Manager [phone]

## 2020-12-15 DIAGNOSIS — K449 Diaphragmatic hernia without obstruction or gangrene: Secondary | ICD-10-CM | POA: Diagnosis not present

## 2020-12-15 DIAGNOSIS — K760 Fatty (change of) liver, not elsewhere classified: Secondary | ICD-10-CM | POA: Diagnosis not present

## 2020-12-15 DIAGNOSIS — E1151 Type 2 diabetes mellitus with diabetic peripheral angiopathy without gangrene: Secondary | ICD-10-CM | POA: Diagnosis not present

## 2020-12-15 DIAGNOSIS — M1812 Unilateral primary osteoarthritis of first carpometacarpal joint, left hand: Secondary | ICD-10-CM | POA: Diagnosis not present

## 2020-12-15 DIAGNOSIS — B0229 Other postherpetic nervous system involvement: Secondary | ICD-10-CM | POA: Diagnosis not present

## 2020-12-15 DIAGNOSIS — Z7902 Long term (current) use of antithrombotics/antiplatelets: Secondary | ICD-10-CM | POA: Diagnosis not present

## 2020-12-15 DIAGNOSIS — Z89431 Acquired absence of right foot: Secondary | ICD-10-CM | POA: Diagnosis not present

## 2020-12-15 DIAGNOSIS — E782 Mixed hyperlipidemia: Secondary | ICD-10-CM | POA: Diagnosis not present

## 2020-12-15 DIAGNOSIS — M109 Gout, unspecified: Secondary | ICD-10-CM | POA: Diagnosis not present

## 2020-12-15 DIAGNOSIS — Z4781 Encounter for orthopedic aftercare following surgical amputation: Secondary | ICD-10-CM | POA: Diagnosis not present

## 2020-12-15 DIAGNOSIS — H409 Unspecified glaucoma: Secondary | ICD-10-CM | POA: Diagnosis not present

## 2020-12-15 DIAGNOSIS — K644 Residual hemorrhoidal skin tags: Secondary | ICD-10-CM | POA: Diagnosis not present

## 2020-12-15 DIAGNOSIS — Z89512 Acquired absence of left leg below knee: Secondary | ICD-10-CM | POA: Diagnosis not present

## 2020-12-15 DIAGNOSIS — Z9181 History of falling: Secondary | ICD-10-CM | POA: Diagnosis not present

## 2020-12-15 DIAGNOSIS — H539 Unspecified visual disturbance: Secondary | ICD-10-CM | POA: Diagnosis not present

## 2020-12-15 DIAGNOSIS — Z87891 Personal history of nicotine dependence: Secondary | ICD-10-CM | POA: Diagnosis not present

## 2020-12-15 DIAGNOSIS — I252 Old myocardial infarction: Secondary | ICD-10-CM | POA: Diagnosis not present

## 2020-12-15 DIAGNOSIS — I10 Essential (primary) hypertension: Secondary | ICD-10-CM | POA: Diagnosis not present

## 2020-12-15 DIAGNOSIS — Z8673 Personal history of transient ischemic attack (TIA), and cerebral infarction without residual deficits: Secondary | ICD-10-CM | POA: Diagnosis not present

## 2020-12-20 DIAGNOSIS — K760 Fatty (change of) liver, not elsewhere classified: Secondary | ICD-10-CM | POA: Diagnosis not present

## 2020-12-20 DIAGNOSIS — Z9181 History of falling: Secondary | ICD-10-CM | POA: Diagnosis not present

## 2020-12-20 DIAGNOSIS — E1151 Type 2 diabetes mellitus with diabetic peripheral angiopathy without gangrene: Secondary | ICD-10-CM | POA: Diagnosis not present

## 2020-12-20 DIAGNOSIS — K449 Diaphragmatic hernia without obstruction or gangrene: Secondary | ICD-10-CM | POA: Diagnosis not present

## 2020-12-20 DIAGNOSIS — M1812 Unilateral primary osteoarthritis of first carpometacarpal joint, left hand: Secondary | ICD-10-CM | POA: Diagnosis not present

## 2020-12-20 DIAGNOSIS — B0229 Other postherpetic nervous system involvement: Secondary | ICD-10-CM | POA: Diagnosis not present

## 2020-12-20 DIAGNOSIS — M109 Gout, unspecified: Secondary | ICD-10-CM | POA: Diagnosis not present

## 2020-12-20 DIAGNOSIS — Z7902 Long term (current) use of antithrombotics/antiplatelets: Secondary | ICD-10-CM | POA: Diagnosis not present

## 2020-12-20 DIAGNOSIS — Z89431 Acquired absence of right foot: Secondary | ICD-10-CM | POA: Diagnosis not present

## 2020-12-20 DIAGNOSIS — Z4781 Encounter for orthopedic aftercare following surgical amputation: Secondary | ICD-10-CM | POA: Diagnosis not present

## 2020-12-20 DIAGNOSIS — H409 Unspecified glaucoma: Secondary | ICD-10-CM | POA: Diagnosis not present

## 2020-12-20 DIAGNOSIS — H539 Unspecified visual disturbance: Secondary | ICD-10-CM | POA: Diagnosis not present

## 2020-12-20 DIAGNOSIS — Z87891 Personal history of nicotine dependence: Secondary | ICD-10-CM | POA: Diagnosis not present

## 2020-12-20 DIAGNOSIS — Z89512 Acquired absence of left leg below knee: Secondary | ICD-10-CM | POA: Diagnosis not present

## 2020-12-20 DIAGNOSIS — I10 Essential (primary) hypertension: Secondary | ICD-10-CM | POA: Diagnosis not present

## 2020-12-20 DIAGNOSIS — K644 Residual hemorrhoidal skin tags: Secondary | ICD-10-CM | POA: Diagnosis not present

## 2020-12-20 DIAGNOSIS — I252 Old myocardial infarction: Secondary | ICD-10-CM | POA: Diagnosis not present

## 2020-12-20 DIAGNOSIS — E782 Mixed hyperlipidemia: Secondary | ICD-10-CM | POA: Diagnosis not present

## 2020-12-20 DIAGNOSIS — Z8673 Personal history of transient ischemic attack (TIA), and cerebral infarction without residual deficits: Secondary | ICD-10-CM | POA: Diagnosis not present

## 2020-12-21 DIAGNOSIS — Z8673 Personal history of transient ischemic attack (TIA), and cerebral infarction without residual deficits: Secondary | ICD-10-CM | POA: Diagnosis not present

## 2020-12-21 DIAGNOSIS — Z89512 Acquired absence of left leg below knee: Secondary | ICD-10-CM | POA: Diagnosis not present

## 2020-12-21 DIAGNOSIS — M109 Gout, unspecified: Secondary | ICD-10-CM | POA: Diagnosis not present

## 2020-12-21 DIAGNOSIS — Z4781 Encounter for orthopedic aftercare following surgical amputation: Secondary | ICD-10-CM | POA: Diagnosis not present

## 2020-12-21 DIAGNOSIS — I10 Essential (primary) hypertension: Secondary | ICD-10-CM | POA: Diagnosis not present

## 2020-12-21 DIAGNOSIS — Z87891 Personal history of nicotine dependence: Secondary | ICD-10-CM | POA: Diagnosis not present

## 2020-12-21 DIAGNOSIS — E782 Mixed hyperlipidemia: Secondary | ICD-10-CM | POA: Diagnosis not present

## 2020-12-21 DIAGNOSIS — E1151 Type 2 diabetes mellitus with diabetic peripheral angiopathy without gangrene: Secondary | ICD-10-CM | POA: Diagnosis not present

## 2020-12-21 DIAGNOSIS — H539 Unspecified visual disturbance: Secondary | ICD-10-CM | POA: Diagnosis not present

## 2020-12-21 DIAGNOSIS — B0229 Other postherpetic nervous system involvement: Secondary | ICD-10-CM | POA: Diagnosis not present

## 2020-12-21 DIAGNOSIS — K449 Diaphragmatic hernia without obstruction or gangrene: Secondary | ICD-10-CM | POA: Diagnosis not present

## 2020-12-21 DIAGNOSIS — Z7902 Long term (current) use of antithrombotics/antiplatelets: Secondary | ICD-10-CM | POA: Diagnosis not present

## 2020-12-21 DIAGNOSIS — Z9181 History of falling: Secondary | ICD-10-CM | POA: Diagnosis not present

## 2020-12-21 DIAGNOSIS — H409 Unspecified glaucoma: Secondary | ICD-10-CM | POA: Diagnosis not present

## 2020-12-21 DIAGNOSIS — I252 Old myocardial infarction: Secondary | ICD-10-CM | POA: Diagnosis not present

## 2020-12-21 DIAGNOSIS — M1812 Unilateral primary osteoarthritis of first carpometacarpal joint, left hand: Secondary | ICD-10-CM | POA: Diagnosis not present

## 2020-12-21 DIAGNOSIS — K760 Fatty (change of) liver, not elsewhere classified: Secondary | ICD-10-CM | POA: Diagnosis not present

## 2020-12-21 DIAGNOSIS — K644 Residual hemorrhoidal skin tags: Secondary | ICD-10-CM | POA: Diagnosis not present

## 2020-12-21 DIAGNOSIS — Z89431 Acquired absence of right foot: Secondary | ICD-10-CM | POA: Diagnosis not present

## 2020-12-26 DIAGNOSIS — S98922D Partial traumatic amputation of left foot, level unspecified, subsequent encounter: Secondary | ICD-10-CM | POA: Diagnosis not present

## 2020-12-26 DIAGNOSIS — I639 Cerebral infarction, unspecified: Secondary | ICD-10-CM | POA: Diagnosis not present

## 2020-12-26 DIAGNOSIS — I2101 ST elevation (STEMI) myocardial infarction involving left main coronary artery: Secondary | ICD-10-CM | POA: Diagnosis not present

## 2020-12-26 DIAGNOSIS — E1151 Type 2 diabetes mellitus with diabetic peripheral angiopathy without gangrene: Secondary | ICD-10-CM | POA: Diagnosis not present

## 2020-12-27 DIAGNOSIS — Z7902 Long term (current) use of antithrombotics/antiplatelets: Secondary | ICD-10-CM | POA: Diagnosis not present

## 2020-12-27 DIAGNOSIS — Z89431 Acquired absence of right foot: Secondary | ICD-10-CM | POA: Diagnosis not present

## 2020-12-27 DIAGNOSIS — H539 Unspecified visual disturbance: Secondary | ICD-10-CM | POA: Diagnosis not present

## 2020-12-27 DIAGNOSIS — H409 Unspecified glaucoma: Secondary | ICD-10-CM | POA: Diagnosis not present

## 2020-12-27 DIAGNOSIS — Z87891 Personal history of nicotine dependence: Secondary | ICD-10-CM | POA: Diagnosis not present

## 2020-12-27 DIAGNOSIS — I10 Essential (primary) hypertension: Secondary | ICD-10-CM | POA: Diagnosis not present

## 2020-12-27 DIAGNOSIS — K644 Residual hemorrhoidal skin tags: Secondary | ICD-10-CM | POA: Diagnosis not present

## 2020-12-27 DIAGNOSIS — Z8673 Personal history of transient ischemic attack (TIA), and cerebral infarction without residual deficits: Secondary | ICD-10-CM | POA: Diagnosis not present

## 2020-12-27 DIAGNOSIS — E782 Mixed hyperlipidemia: Secondary | ICD-10-CM | POA: Diagnosis not present

## 2020-12-27 DIAGNOSIS — Z4781 Encounter for orthopedic aftercare following surgical amputation: Secondary | ICD-10-CM | POA: Diagnosis not present

## 2020-12-27 DIAGNOSIS — M109 Gout, unspecified: Secondary | ICD-10-CM | POA: Diagnosis not present

## 2020-12-27 DIAGNOSIS — K449 Diaphragmatic hernia without obstruction or gangrene: Secondary | ICD-10-CM | POA: Diagnosis not present

## 2020-12-27 DIAGNOSIS — Z9181 History of falling: Secondary | ICD-10-CM | POA: Diagnosis not present

## 2020-12-27 DIAGNOSIS — E1151 Type 2 diabetes mellitus with diabetic peripheral angiopathy without gangrene: Secondary | ICD-10-CM | POA: Diagnosis not present

## 2020-12-27 DIAGNOSIS — K760 Fatty (change of) liver, not elsewhere classified: Secondary | ICD-10-CM | POA: Diagnosis not present

## 2020-12-27 DIAGNOSIS — Z89512 Acquired absence of left leg below knee: Secondary | ICD-10-CM | POA: Diagnosis not present

## 2020-12-27 DIAGNOSIS — M1812 Unilateral primary osteoarthritis of first carpometacarpal joint, left hand: Secondary | ICD-10-CM | POA: Diagnosis not present

## 2020-12-27 DIAGNOSIS — I252 Old myocardial infarction: Secondary | ICD-10-CM | POA: Diagnosis not present

## 2020-12-27 DIAGNOSIS — B0229 Other postherpetic nervous system involvement: Secondary | ICD-10-CM | POA: Diagnosis not present

## 2020-12-28 DIAGNOSIS — M1812 Unilateral primary osteoarthritis of first carpometacarpal joint, left hand: Secondary | ICD-10-CM | POA: Diagnosis not present

## 2020-12-28 DIAGNOSIS — Z89512 Acquired absence of left leg below knee: Secondary | ICD-10-CM | POA: Diagnosis not present

## 2020-12-28 DIAGNOSIS — E782 Mixed hyperlipidemia: Secondary | ICD-10-CM | POA: Diagnosis not present

## 2020-12-28 DIAGNOSIS — Z87891 Personal history of nicotine dependence: Secondary | ICD-10-CM | POA: Diagnosis not present

## 2020-12-28 DIAGNOSIS — Z4781 Encounter for orthopedic aftercare following surgical amputation: Secondary | ICD-10-CM | POA: Diagnosis not present

## 2020-12-28 DIAGNOSIS — H539 Unspecified visual disturbance: Secondary | ICD-10-CM | POA: Diagnosis not present

## 2020-12-28 DIAGNOSIS — H409 Unspecified glaucoma: Secondary | ICD-10-CM | POA: Diagnosis not present

## 2020-12-28 DIAGNOSIS — Z7902 Long term (current) use of antithrombotics/antiplatelets: Secondary | ICD-10-CM | POA: Diagnosis not present

## 2020-12-28 DIAGNOSIS — Z8673 Personal history of transient ischemic attack (TIA), and cerebral infarction without residual deficits: Secondary | ICD-10-CM | POA: Diagnosis not present

## 2020-12-28 DIAGNOSIS — Z9181 History of falling: Secondary | ICD-10-CM | POA: Diagnosis not present

## 2020-12-28 DIAGNOSIS — M109 Gout, unspecified: Secondary | ICD-10-CM | POA: Diagnosis not present

## 2020-12-28 DIAGNOSIS — I252 Old myocardial infarction: Secondary | ICD-10-CM | POA: Diagnosis not present

## 2020-12-28 DIAGNOSIS — K760 Fatty (change of) liver, not elsewhere classified: Secondary | ICD-10-CM | POA: Diagnosis not present

## 2020-12-28 DIAGNOSIS — K449 Diaphragmatic hernia without obstruction or gangrene: Secondary | ICD-10-CM | POA: Diagnosis not present

## 2020-12-28 DIAGNOSIS — Z89431 Acquired absence of right foot: Secondary | ICD-10-CM | POA: Diagnosis not present

## 2020-12-28 DIAGNOSIS — B0229 Other postherpetic nervous system involvement: Secondary | ICD-10-CM | POA: Diagnosis not present

## 2020-12-28 DIAGNOSIS — E1151 Type 2 diabetes mellitus with diabetic peripheral angiopathy without gangrene: Secondary | ICD-10-CM | POA: Diagnosis not present

## 2020-12-28 DIAGNOSIS — I10 Essential (primary) hypertension: Secondary | ICD-10-CM | POA: Diagnosis not present

## 2020-12-28 DIAGNOSIS — K644 Residual hemorrhoidal skin tags: Secondary | ICD-10-CM | POA: Diagnosis not present

## 2021-01-01 ENCOUNTER — Encounter: Payer: Medicare Other | Admitting: Orthopedic Surgery

## 2021-01-03 DIAGNOSIS — E782 Mixed hyperlipidemia: Secondary | ICD-10-CM | POA: Diagnosis not present

## 2021-01-03 DIAGNOSIS — I10 Essential (primary) hypertension: Secondary | ICD-10-CM | POA: Diagnosis not present

## 2021-01-03 DIAGNOSIS — M1812 Unilateral primary osteoarthritis of first carpometacarpal joint, left hand: Secondary | ICD-10-CM | POA: Diagnosis not present

## 2021-01-03 DIAGNOSIS — Z8673 Personal history of transient ischemic attack (TIA), and cerebral infarction without residual deficits: Secondary | ICD-10-CM | POA: Diagnosis not present

## 2021-01-03 DIAGNOSIS — Z89512 Acquired absence of left leg below knee: Secondary | ICD-10-CM | POA: Diagnosis not present

## 2021-01-03 DIAGNOSIS — H409 Unspecified glaucoma: Secondary | ICD-10-CM | POA: Diagnosis not present

## 2021-01-03 DIAGNOSIS — Z9181 History of falling: Secondary | ICD-10-CM | POA: Diagnosis not present

## 2021-01-03 DIAGNOSIS — K644 Residual hemorrhoidal skin tags: Secondary | ICD-10-CM | POA: Diagnosis not present

## 2021-01-03 DIAGNOSIS — K760 Fatty (change of) liver, not elsewhere classified: Secondary | ICD-10-CM | POA: Diagnosis not present

## 2021-01-03 DIAGNOSIS — E1151 Type 2 diabetes mellitus with diabetic peripheral angiopathy without gangrene: Secondary | ICD-10-CM | POA: Diagnosis not present

## 2021-01-03 DIAGNOSIS — Z7902 Long term (current) use of antithrombotics/antiplatelets: Secondary | ICD-10-CM | POA: Diagnosis not present

## 2021-01-03 DIAGNOSIS — B0229 Other postherpetic nervous system involvement: Secondary | ICD-10-CM | POA: Diagnosis not present

## 2021-01-03 DIAGNOSIS — K449 Diaphragmatic hernia without obstruction or gangrene: Secondary | ICD-10-CM | POA: Diagnosis not present

## 2021-01-03 DIAGNOSIS — I252 Old myocardial infarction: Secondary | ICD-10-CM | POA: Diagnosis not present

## 2021-01-03 DIAGNOSIS — H539 Unspecified visual disturbance: Secondary | ICD-10-CM | POA: Diagnosis not present

## 2021-01-03 DIAGNOSIS — Z4781 Encounter for orthopedic aftercare following surgical amputation: Secondary | ICD-10-CM | POA: Diagnosis not present

## 2021-01-03 DIAGNOSIS — M109 Gout, unspecified: Secondary | ICD-10-CM | POA: Diagnosis not present

## 2021-01-03 DIAGNOSIS — Z87891 Personal history of nicotine dependence: Secondary | ICD-10-CM | POA: Diagnosis not present

## 2021-01-03 DIAGNOSIS — Z89431 Acquired absence of right foot: Secondary | ICD-10-CM | POA: Diagnosis not present

## 2021-01-05 DIAGNOSIS — E782 Mixed hyperlipidemia: Secondary | ICD-10-CM | POA: Diagnosis not present

## 2021-01-05 DIAGNOSIS — K449 Diaphragmatic hernia without obstruction or gangrene: Secondary | ICD-10-CM | POA: Diagnosis not present

## 2021-01-05 DIAGNOSIS — H539 Unspecified visual disturbance: Secondary | ICD-10-CM | POA: Diagnosis not present

## 2021-01-05 DIAGNOSIS — Z89431 Acquired absence of right foot: Secondary | ICD-10-CM | POA: Diagnosis not present

## 2021-01-05 DIAGNOSIS — I10 Essential (primary) hypertension: Secondary | ICD-10-CM | POA: Diagnosis not present

## 2021-01-05 DIAGNOSIS — M109 Gout, unspecified: Secondary | ICD-10-CM | POA: Diagnosis not present

## 2021-01-05 DIAGNOSIS — I252 Old myocardial infarction: Secondary | ICD-10-CM | POA: Diagnosis not present

## 2021-01-05 DIAGNOSIS — Z87891 Personal history of nicotine dependence: Secondary | ICD-10-CM | POA: Diagnosis not present

## 2021-01-05 DIAGNOSIS — M1812 Unilateral primary osteoarthritis of first carpometacarpal joint, left hand: Secondary | ICD-10-CM | POA: Diagnosis not present

## 2021-01-05 DIAGNOSIS — Z9181 History of falling: Secondary | ICD-10-CM | POA: Diagnosis not present

## 2021-01-05 DIAGNOSIS — B0229 Other postherpetic nervous system involvement: Secondary | ICD-10-CM | POA: Diagnosis not present

## 2021-01-05 DIAGNOSIS — H409 Unspecified glaucoma: Secondary | ICD-10-CM | POA: Diagnosis not present

## 2021-01-05 DIAGNOSIS — Z8673 Personal history of transient ischemic attack (TIA), and cerebral infarction without residual deficits: Secondary | ICD-10-CM | POA: Diagnosis not present

## 2021-01-05 DIAGNOSIS — K644 Residual hemorrhoidal skin tags: Secondary | ICD-10-CM | POA: Diagnosis not present

## 2021-01-05 DIAGNOSIS — Z89512 Acquired absence of left leg below knee: Secondary | ICD-10-CM | POA: Diagnosis not present

## 2021-01-05 DIAGNOSIS — E1151 Type 2 diabetes mellitus with diabetic peripheral angiopathy without gangrene: Secondary | ICD-10-CM | POA: Diagnosis not present

## 2021-01-05 DIAGNOSIS — K760 Fatty (change of) liver, not elsewhere classified: Secondary | ICD-10-CM | POA: Diagnosis not present

## 2021-01-05 DIAGNOSIS — Z7902 Long term (current) use of antithrombotics/antiplatelets: Secondary | ICD-10-CM | POA: Diagnosis not present

## 2021-01-05 DIAGNOSIS — Z4781 Encounter for orthopedic aftercare following surgical amputation: Secondary | ICD-10-CM | POA: Diagnosis not present

## 2021-01-09 ENCOUNTER — Ambulatory Visit (INDEPENDENT_AMBULATORY_CARE_PROVIDER_SITE_OTHER): Payer: Medicare Other | Admitting: Orthopedic Surgery

## 2021-01-09 ENCOUNTER — Other Ambulatory Visit: Payer: Self-pay

## 2021-01-09 DIAGNOSIS — H539 Unspecified visual disturbance: Secondary | ICD-10-CM | POA: Diagnosis not present

## 2021-01-09 DIAGNOSIS — I252 Old myocardial infarction: Secondary | ICD-10-CM | POA: Diagnosis not present

## 2021-01-09 DIAGNOSIS — Z8673 Personal history of transient ischemic attack (TIA), and cerebral infarction without residual deficits: Secondary | ICD-10-CM | POA: Diagnosis not present

## 2021-01-09 DIAGNOSIS — E1151 Type 2 diabetes mellitus with diabetic peripheral angiopathy without gangrene: Secondary | ICD-10-CM | POA: Diagnosis not present

## 2021-01-09 DIAGNOSIS — Z89512 Acquired absence of left leg below knee: Secondary | ICD-10-CM | POA: Diagnosis not present

## 2021-01-09 DIAGNOSIS — E782 Mixed hyperlipidemia: Secondary | ICD-10-CM | POA: Diagnosis not present

## 2021-01-09 DIAGNOSIS — H409 Unspecified glaucoma: Secondary | ICD-10-CM | POA: Diagnosis not present

## 2021-01-09 DIAGNOSIS — K760 Fatty (change of) liver, not elsewhere classified: Secondary | ICD-10-CM | POA: Diagnosis not present

## 2021-01-09 DIAGNOSIS — Z9181 History of falling: Secondary | ICD-10-CM | POA: Diagnosis not present

## 2021-01-09 DIAGNOSIS — K644 Residual hemorrhoidal skin tags: Secondary | ICD-10-CM | POA: Diagnosis not present

## 2021-01-09 DIAGNOSIS — M1812 Unilateral primary osteoarthritis of first carpometacarpal joint, left hand: Secondary | ICD-10-CM | POA: Diagnosis not present

## 2021-01-09 DIAGNOSIS — I10 Essential (primary) hypertension: Secondary | ICD-10-CM | POA: Diagnosis not present

## 2021-01-09 DIAGNOSIS — B0229 Other postherpetic nervous system involvement: Secondary | ICD-10-CM | POA: Diagnosis not present

## 2021-01-09 DIAGNOSIS — Z87891 Personal history of nicotine dependence: Secondary | ICD-10-CM | POA: Diagnosis not present

## 2021-01-09 DIAGNOSIS — Z4781 Encounter for orthopedic aftercare following surgical amputation: Secondary | ICD-10-CM | POA: Diagnosis not present

## 2021-01-09 DIAGNOSIS — Z89431 Acquired absence of right foot: Secondary | ICD-10-CM | POA: Diagnosis not present

## 2021-01-09 DIAGNOSIS — K449 Diaphragmatic hernia without obstruction or gangrene: Secondary | ICD-10-CM | POA: Diagnosis not present

## 2021-01-09 DIAGNOSIS — M109 Gout, unspecified: Secondary | ICD-10-CM | POA: Diagnosis not present

## 2021-01-09 DIAGNOSIS — Z7902 Long term (current) use of antithrombotics/antiplatelets: Secondary | ICD-10-CM | POA: Diagnosis not present

## 2021-01-10 ENCOUNTER — Other Ambulatory Visit: Payer: Self-pay | Admitting: *Deleted

## 2021-01-10 ENCOUNTER — Telehealth: Payer: Self-pay | Admitting: Orthopedic Surgery

## 2021-01-10 DIAGNOSIS — E1151 Type 2 diabetes mellitus with diabetic peripheral angiopathy without gangrene: Secondary | ICD-10-CM | POA: Diagnosis not present

## 2021-01-10 DIAGNOSIS — M1812 Unilateral primary osteoarthritis of first carpometacarpal joint, left hand: Secondary | ICD-10-CM | POA: Diagnosis not present

## 2021-01-10 DIAGNOSIS — K449 Diaphragmatic hernia without obstruction or gangrene: Secondary | ICD-10-CM | POA: Diagnosis not present

## 2021-01-10 DIAGNOSIS — Z89431 Acquired absence of right foot: Secondary | ICD-10-CM | POA: Diagnosis not present

## 2021-01-10 DIAGNOSIS — E782 Mixed hyperlipidemia: Secondary | ICD-10-CM | POA: Diagnosis not present

## 2021-01-10 DIAGNOSIS — Z7902 Long term (current) use of antithrombotics/antiplatelets: Secondary | ICD-10-CM | POA: Diagnosis not present

## 2021-01-10 DIAGNOSIS — K760 Fatty (change of) liver, not elsewhere classified: Secondary | ICD-10-CM | POA: Diagnosis not present

## 2021-01-10 DIAGNOSIS — Z89512 Acquired absence of left leg below knee: Secondary | ICD-10-CM | POA: Diagnosis not present

## 2021-01-10 DIAGNOSIS — H539 Unspecified visual disturbance: Secondary | ICD-10-CM | POA: Diagnosis not present

## 2021-01-10 DIAGNOSIS — Z87891 Personal history of nicotine dependence: Secondary | ICD-10-CM | POA: Diagnosis not present

## 2021-01-10 DIAGNOSIS — B0229 Other postherpetic nervous system involvement: Secondary | ICD-10-CM | POA: Diagnosis not present

## 2021-01-10 DIAGNOSIS — M109 Gout, unspecified: Secondary | ICD-10-CM | POA: Diagnosis not present

## 2021-01-10 DIAGNOSIS — Z4781 Encounter for orthopedic aftercare following surgical amputation: Secondary | ICD-10-CM | POA: Diagnosis not present

## 2021-01-10 DIAGNOSIS — I10 Essential (primary) hypertension: Secondary | ICD-10-CM | POA: Diagnosis not present

## 2021-01-10 DIAGNOSIS — K644 Residual hemorrhoidal skin tags: Secondary | ICD-10-CM | POA: Diagnosis not present

## 2021-01-10 DIAGNOSIS — H409 Unspecified glaucoma: Secondary | ICD-10-CM | POA: Diagnosis not present

## 2021-01-10 DIAGNOSIS — Z8673 Personal history of transient ischemic attack (TIA), and cerebral infarction without residual deficits: Secondary | ICD-10-CM | POA: Diagnosis not present

## 2021-01-10 DIAGNOSIS — Z9181 History of falling: Secondary | ICD-10-CM | POA: Diagnosis not present

## 2021-01-10 DIAGNOSIS — I252 Old myocardial infarction: Secondary | ICD-10-CM | POA: Diagnosis not present

## 2021-01-10 NOTE — Patient Outreach (Signed)
[facility]) Care Management  01/10/2021  [name] 11-02-1940 [id number]   Outgoing call placed to member, state he is doing well.  Report wound has healed, denies having pain.  State plan is to be fitted for prosthesis soon.  Call to niece was unsuccessful, voice message left.  Denies any urgent concerns, encouraged to contact this care manager with questions.  Agrees to follow up within the next month.   Goals Addressed             This Visit's Progress    THN - Find Help in My Community   On track    Timeframe:  Short-Term Goal Priority:  Medium Start Date:          6/10                   Expected End Date:  8/10  (goal extended)      Barriers: Health Behaviors Knowledge Support System    - follow-up on any referrals for help I am given    Why is this important?   Knowing how and where to find help for yourself or family in your neighborhood and community is an important skill.  You will want to take some steps to learn how.    Notes:   6/10 - Referral placed to Care Guide for transportation and meal resources  6/17 - Confirmed with care guide that meals has been set up.  Per chart, Home health has also started  6/29 - Report HH remains involved.  Niece has enlisted the help of friends/family to help care for member with meals, etc.  7/27 - Patient report he is still active with PT, will have visit today.  Call placed to niece to follow up on Medicaid, no answer.      THN - Make and Keep All Appointments   On track    Timeframe:  Long-Range Goal Priority:  High Start Date:         6/10                    Expected End Date:    9/10                    Barriers: [place] Transportation    - call to cancel if needed - keep a calendar with appointment dates    Why is this important?   Part of staying healthy is seeing the doctor for follow-up care.  If you forget your appointments, there are some things you can do  to stay on track.    Notes:   6/10 - Reviewed upcoming appointment with member and niece, discussed options for transportation resources  6/17 - attended ortho follow up on 6/13, has another one scheduled for 6/27.    6/29 - Attended follow up on 6/27, has staples removed, denies any open areas or drainage.  Next appointment scheduled for 7/18, member made aware  7/27 - Report completion of appointment with Dr. Sharol Given on yesterday, follow up in October.  Unsure when his PCP appointment is, will discuss with niece       Valente David, RN, MSN [place] Manager [phone]

## 2021-01-10 NOTE — Telephone Encounter (Signed)
Pt called requesting a call back from Autumn F. Pt states he need pain meds but would like to speak with Autumn F. Pt phone number is [phone].

## 2021-01-10 NOTE — Telephone Encounter (Signed)
I called pt and he is wanting something for neuropathy. I advised pt that we have talked about this several times and that he needs to let me know if he is taking his neurontin or lyrica because he can not take them together. Pt does not know what he is taking and said that he will check with his niece and call back.

## 2021-01-11 ENCOUNTER — Ambulatory Visit: Payer: Self-pay | Admitting: *Deleted

## 2021-01-12 ENCOUNTER — Encounter: Payer: Self-pay | Admitting: Orthopedic Surgery

## 2021-01-12 DIAGNOSIS — B0229 Other postherpetic nervous system involvement: Secondary | ICD-10-CM | POA: Diagnosis not present

## 2021-01-12 DIAGNOSIS — Z89512 Acquired absence of left leg below knee: Secondary | ICD-10-CM | POA: Diagnosis not present

## 2021-01-12 DIAGNOSIS — I252 Old myocardial infarction: Secondary | ICD-10-CM | POA: Diagnosis not present

## 2021-01-12 DIAGNOSIS — Z89431 Acquired absence of right foot: Secondary | ICD-10-CM | POA: Diagnosis not present

## 2021-01-12 DIAGNOSIS — K644 Residual hemorrhoidal skin tags: Secondary | ICD-10-CM | POA: Diagnosis not present

## 2021-01-12 DIAGNOSIS — K449 Diaphragmatic hernia without obstruction or gangrene: Secondary | ICD-10-CM | POA: Diagnosis not present

## 2021-01-12 DIAGNOSIS — H409 Unspecified glaucoma: Secondary | ICD-10-CM | POA: Diagnosis not present

## 2021-01-12 DIAGNOSIS — M109 Gout, unspecified: Secondary | ICD-10-CM | POA: Diagnosis not present

## 2021-01-12 DIAGNOSIS — I10 Essential (primary) hypertension: Secondary | ICD-10-CM | POA: Diagnosis not present

## 2021-01-12 DIAGNOSIS — M1812 Unilateral primary osteoarthritis of first carpometacarpal joint, left hand: Secondary | ICD-10-CM | POA: Diagnosis not present

## 2021-01-12 DIAGNOSIS — K760 Fatty (change of) liver, not elsewhere classified: Secondary | ICD-10-CM | POA: Diagnosis not present

## 2021-01-12 DIAGNOSIS — Z8673 Personal history of transient ischemic attack (TIA), and cerebral infarction without residual deficits: Secondary | ICD-10-CM | POA: Diagnosis not present

## 2021-01-12 DIAGNOSIS — E1151 Type 2 diabetes mellitus with diabetic peripheral angiopathy without gangrene: Secondary | ICD-10-CM | POA: Diagnosis not present

## 2021-01-12 DIAGNOSIS — E782 Mixed hyperlipidemia: Secondary | ICD-10-CM | POA: Diagnosis not present

## 2021-01-12 DIAGNOSIS — Z9181 History of falling: Secondary | ICD-10-CM | POA: Diagnosis not present

## 2021-01-12 DIAGNOSIS — Z87891 Personal history of nicotine dependence: Secondary | ICD-10-CM | POA: Diagnosis not present

## 2021-01-12 DIAGNOSIS — Z7902 Long term (current) use of antithrombotics/antiplatelets: Secondary | ICD-10-CM | POA: Diagnosis not present

## 2021-01-12 DIAGNOSIS — Z4781 Encounter for orthopedic aftercare following surgical amputation: Secondary | ICD-10-CM | POA: Diagnosis not present

## 2021-01-12 DIAGNOSIS — H539 Unspecified visual disturbance: Secondary | ICD-10-CM | POA: Diagnosis not present

## 2021-01-12 NOTE — Progress Notes (Signed)
Office Visit Note   Patient: [name]           Date of Birth: 1940/10/17           MRN: [id number] Visit Date: 01/09/2021              Requested by: [name], MD [address] [place],  [place] [address] PCP: [name], MD  Chief Complaint  Patient presents with   Left Leg - Routine Post Op    11/22/20 left BKA       HPI: Patient is a 80 year old gentleman who is 2 months status post left below-knee amputation he is currently wearing a regular sock on his leg he states he does have a prosthetic shrinker at home.  He has a prescription for [employer] to fabricate a prosthesis but he has not made an appointment yet.  Assessment & Plan: Visit Diagnoses:  1. S/P BKA (below knee amputation) unilateral, left ([place])     Plan: Recommended patient follow-up with [employer] recommended using the prosthetic shrinker.  Follow-Up Instructions: Return in about 2 months (around 03/12/2021).   Ortho Exam  Patient is alert, oriented, no adenopathy, well-dressed, normal affect, normal respiratory effort. Examination the patient has full extension the incision is well-healed there is good consolidation of the soft tissue envelope.  Imaging: No results found.   Labs: Lab Results  Component Value Date   HGBA1C 5.7 (H) 03/21/2020   HGBA1C 5.4 11/01/2018   HGBA1C 5.2 05/29/2018   ESRSEDRATE 50 (H) 03/20/2020   ESRSEDRATE 50 (H) 03/17/2020   ESRSEDRATE 34 (H) 12/17/2017   CRP 13.5 (H) 03/25/2020   CRP 0.8 03/17/2020   CRP 17.1 (H) 12/17/2017   REPTSTATUS 03/25/2020 FINAL 03/19/2020   REPTSTATUS 03/25/2020 FINAL 03/19/2020   REPTSTATUS 03/21/2020 FINAL 03/19/2020   GRAMSTAIN  03/17/2020    FEW WBC PRESENT, PREDOMINANTLY PMN FEW GRAM POSITIVE COCCI    CULT  03/19/2020    NO GROWTH 5 DAYS Performed at [facility], [place] [address], [place], [place] [address]    CULT  03/19/2020    NO GROWTH 5 DAYS Performed at [facility]  [address], [place], [place] [address]    CULT (A) 03/19/2020    <10,000 COLONIES/mL INSIGNIFICANT GROWTH Performed at [facility] [address], [place], [place] [address]    LABORGA METHICILLIN RESISTANT STAPHYLOCOCCUS AUREUS 03/17/2020     Lab Results  Component Value Date   ALBUMIN 3.5 03/19/2020   ALBUMIN 3.7 02/05/2020   ALBUMIN 3.2 (L) 10/31/2018    Lab Results  Component Value Date   MG 1.9 03/27/2020   MG 1.6 (L) 03/26/2020   MG 1.4 (L) 03/25/2020   No results found for: VD25OH  No results found for: PREALBUMIN CBC EXTENDED Latest Ref Rng & Units 11/23/2020 11/22/2020 03/27/2020  WBC 4.0 - 10.5 K/uL 12.6(H) 8.3 8.9  RBC 4.22 - 5.81 MIL/uL 3.52(L) 3.77(L) 3.36(L)  HGB 13.0 - 17.0 g/dL 9.9(L) 10.7(L) 9.6(L)  HCT 39.0 - 52.0 % 30.0(L) 33.0(L) 29.5(L)  PLT 150 - 400 K/uL 293 292 280  NEUTROABS 1.7 - 7.7 K/uL - - 5.4  LYMPHSABS 0.7 - 4.0 K/uL - - 1.4     There is no height or weight on file to calculate BMI.  Orders:  No orders of the defined types were placed in this encounter.  No orders of the defined types were placed in this encounter.    Procedures: No  procedures performed  Clinical Data: No additional findings.  ROS:  All other systems negative, except as noted in the HPI. Review of Systems  Objective: Vital Signs: There were no vitals taken for this visit.  Specialty Comments:  No specialty comments available.  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 ([place]) 04/12/2020   Acute encephalopathy 03/20/2020   SIRS (systemic inflammatory response syndrome) ([place]) 03/20/2020   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[place]) 03/20/2020   ARF (acute renal failure) ([place]) 03/20/2020   Midfoot ulcer, left, limited to breakdown of skin ([place]) 12/29/2018   Occipital stroke ([place]) 10/31/2018   DM2  (diabetes mellitus, type 2) ([place]) 10/31/2018   NSTEMI (non-ST elevated myocardial infarction) ([place]) 05/28/2018   Syncope 05/28/2018   HTN (hypertension) 05/28/2018   Hyperlipemia, mixed 05/28/2018   Rash and nonspecific skin eruption 12/16/2017   Pain in right hand 09/11/2017   Carpal tunnel syndrome, right upper limb 07/01/2017   Arthritis of carpometacarpal [facility]) joint of left thumb 12/30/2016   Midfoot skin ulcer, right, limited to breakdown of skin ([place]) 07/22/2016   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[place]) 09/23/2014   Osteomyelitis of ankle or foot, left, acute ([place]) 08/23/2014   Cellulitis 10/12/2012   Chest pain 10/12/2012   Tobacco abuse 10/12/2012   ABSCESS, AXILLA, LEFT 02/23/2008   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   CHEST WALL PAIN, ACUTE 12/03/2007   CANDIDIASIS, GLANS PENIS 09/16/2007   HEADACHE 08/04/2007   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 ([place]) 03/05/2007   Other and unspecified hyperlipidemia 03/05/2007   GOUT 03/05/2007   ERECTILE DYSFUNCTION 03/05/2007   EXTERNAL HEMORRHOIDS 03/05/2007   VENTRAL HERNIA 03/05/2007   FATTY LIVER DISEASE 03/05/2007   Osteoarthritis 03/05/2007   HEMORRHOIDS, INTERNAL 03/17/2005   COLONIC POLYPS, HX OF 03/17/2005   Past Medical History:  Diagnosis Date   Arthritis    Denies   Glaucoma    High cholesterol    Hypertension    denies   Osteomyelitis ([place])    Right great toe   Peripheral vascular disease ([place])    diabetic  with osteomylitis   Pre-diabetes    Stroke (cerebrum) ([place]) 10/2018    Family History  Problem Relation Age of Onset   Diabetes Mellitus II Other    Anesthesia problems Neg Hx     Past Surgical History:  Procedure Laterality Date   AMPUTATION  07/23/2011   Procedure: AMPUTATION DIGIT;  Surgeon: Newt Minion, MD;  Location: [facility];  Service: Orthopedics;  Laterality: Right;  Right Great Toe Amputation   AMPUTATION Right 09/18/2012    Procedure: Right Foot 2nd Ray Amputation;  Surgeon: Newt Minion, MD;  Location: [facility];  Service: Orthopedics;  Laterality: Right;  Right Foot 2nd Ray Amputation   AMPUTATION Right 10/14/2012   Procedure: AMPUTATION FOOT;  Surgeon: Newt Minion, MD;  Location: [facility];  Service: Orthopedics;  Laterality: Right;  Right Foot Transmetatarsal Amputation   AMPUTATION Left 09/23/2014   Procedure: Left Foot Transmetatarsal Amputation;  Surgeon: Newt Minion, MD;  Location: [facility];  Service: Orthopedics;  Laterality: Left;   AMPUTATION Left 11/22/2020   Procedure: LEFT BELOW KNEE AMPUTATION;  Surgeon: Newt Minion, MD;  Location: [facility];  Service: Orthopedics;  Laterality: Left;  BACK SURGERY     lower   CARPAL TUNNEL RELEASE Right 01/30/2018   Procedure: RIGHT CARPAL TUNNEL RELEASE;  Surgeon: Newt Minion, MD;  Location: [facility];  Service: Orthopedics;  Laterality: Right;   CIRCUMCISION  2010   COLONOSCOPY     FOOT FRACTURE SURGERY Left 1970's   "broke it playing football" (10/12/2012)   HUMERUS FRACTURE SURGERY W/ IMPLANT Right 1960's   "put a plate in it" (QA348G)   I & D EXTREMITY Right 03/24/2020   Procedure: EXCISION RIGHT MEDIAL CUNEFORM;  Surgeon: Newt Minion, MD;  Location: [facility];  Service: Orthopedics;  Laterality: Right;   INCISION AND DRAINAGE OF WOUND Left 2006   "foot" (10/12/2012)   [place] SURGERY  2008   Social History   Occupational History   Not on file  Tobacco Use   Smoking status: Every Day    Types: Pipe   Smokeless tobacco: Never   Tobacco comments:    Uses 2-3 barrels a day.  Vaping Use   Vaping Use: Never used  Substance and Sexual Activity   Alcohol use: No    Alcohol/week: 0.0 standard drinks   Drug use: No   Sexual activity: Not Currently

## 2021-01-17 DIAGNOSIS — Z89431 Acquired absence of right foot: Secondary | ICD-10-CM | POA: Diagnosis not present

## 2021-01-17 DIAGNOSIS — I252 Old myocardial infarction: Secondary | ICD-10-CM | POA: Diagnosis not present

## 2021-01-17 DIAGNOSIS — Z7902 Long term (current) use of antithrombotics/antiplatelets: Secondary | ICD-10-CM | POA: Diagnosis not present

## 2021-01-17 DIAGNOSIS — M109 Gout, unspecified: Secondary | ICD-10-CM | POA: Diagnosis not present

## 2021-01-17 DIAGNOSIS — K760 Fatty (change of) liver, not elsewhere classified: Secondary | ICD-10-CM | POA: Diagnosis not present

## 2021-01-17 DIAGNOSIS — M1812 Unilateral primary osteoarthritis of first carpometacarpal joint, left hand: Secondary | ICD-10-CM | POA: Diagnosis not present

## 2021-01-17 DIAGNOSIS — K449 Diaphragmatic hernia without obstruction or gangrene: Secondary | ICD-10-CM | POA: Diagnosis not present

## 2021-01-17 DIAGNOSIS — B0229 Other postherpetic nervous system involvement: Secondary | ICD-10-CM | POA: Diagnosis not present

## 2021-01-17 DIAGNOSIS — H539 Unspecified visual disturbance: Secondary | ICD-10-CM | POA: Diagnosis not present

## 2021-01-17 DIAGNOSIS — Z8673 Personal history of transient ischemic attack (TIA), and cerebral infarction without residual deficits: Secondary | ICD-10-CM | POA: Diagnosis not present

## 2021-01-17 DIAGNOSIS — H409 Unspecified glaucoma: Secondary | ICD-10-CM | POA: Diagnosis not present

## 2021-01-17 DIAGNOSIS — Z89512 Acquired absence of left leg below knee: Secondary | ICD-10-CM | POA: Diagnosis not present

## 2021-01-17 DIAGNOSIS — Z87891 Personal history of nicotine dependence: Secondary | ICD-10-CM | POA: Diagnosis not present

## 2021-01-17 DIAGNOSIS — Z4781 Encounter for orthopedic aftercare following surgical amputation: Secondary | ICD-10-CM | POA: Diagnosis not present

## 2021-01-17 DIAGNOSIS — E1151 Type 2 diabetes mellitus with diabetic peripheral angiopathy without gangrene: Secondary | ICD-10-CM | POA: Diagnosis not present

## 2021-01-17 DIAGNOSIS — K644 Residual hemorrhoidal skin tags: Secondary | ICD-10-CM | POA: Diagnosis not present

## 2021-01-17 DIAGNOSIS — I10 Essential (primary) hypertension: Secondary | ICD-10-CM | POA: Diagnosis not present

## 2021-01-17 DIAGNOSIS — Z9181 History of falling: Secondary | ICD-10-CM | POA: Diagnosis not present

## 2021-01-17 DIAGNOSIS — E782 Mixed hyperlipidemia: Secondary | ICD-10-CM | POA: Diagnosis not present

## 2021-01-19 DIAGNOSIS — I10 Essential (primary) hypertension: Secondary | ICD-10-CM | POA: Diagnosis not present

## 2021-01-19 DIAGNOSIS — E1151 Type 2 diabetes mellitus with diabetic peripheral angiopathy without gangrene: Secondary | ICD-10-CM | POA: Diagnosis not present

## 2021-01-19 DIAGNOSIS — K760 Fatty (change of) liver, not elsewhere classified: Secondary | ICD-10-CM | POA: Diagnosis not present

## 2021-01-19 DIAGNOSIS — K449 Diaphragmatic hernia without obstruction or gangrene: Secondary | ICD-10-CM | POA: Diagnosis not present

## 2021-01-19 DIAGNOSIS — K644 Residual hemorrhoidal skin tags: Secondary | ICD-10-CM | POA: Diagnosis not present

## 2021-01-19 DIAGNOSIS — Z89431 Acquired absence of right foot: Secondary | ICD-10-CM | POA: Diagnosis not present

## 2021-01-19 DIAGNOSIS — Z87891 Personal history of nicotine dependence: Secondary | ICD-10-CM | POA: Diagnosis not present

## 2021-01-19 DIAGNOSIS — I252 Old myocardial infarction: Secondary | ICD-10-CM | POA: Diagnosis not present

## 2021-01-19 DIAGNOSIS — Z8673 Personal history of transient ischemic attack (TIA), and cerebral infarction without residual deficits: Secondary | ICD-10-CM | POA: Diagnosis not present

## 2021-01-19 DIAGNOSIS — H539 Unspecified visual disturbance: Secondary | ICD-10-CM | POA: Diagnosis not present

## 2021-01-19 DIAGNOSIS — B0229 Other postherpetic nervous system involvement: Secondary | ICD-10-CM | POA: Diagnosis not present

## 2021-01-19 DIAGNOSIS — Z4781 Encounter for orthopedic aftercare following surgical amputation: Secondary | ICD-10-CM | POA: Diagnosis not present

## 2021-01-19 DIAGNOSIS — Z9181 History of falling: Secondary | ICD-10-CM | POA: Diagnosis not present

## 2021-01-19 DIAGNOSIS — Z7902 Long term (current) use of antithrombotics/antiplatelets: Secondary | ICD-10-CM | POA: Diagnosis not present

## 2021-01-19 DIAGNOSIS — H409 Unspecified glaucoma: Secondary | ICD-10-CM | POA: Diagnosis not present

## 2021-01-19 DIAGNOSIS — M109 Gout, unspecified: Secondary | ICD-10-CM | POA: Diagnosis not present

## 2021-01-19 DIAGNOSIS — E782 Mixed hyperlipidemia: Secondary | ICD-10-CM | POA: Diagnosis not present

## 2021-01-19 DIAGNOSIS — Z89512 Acquired absence of left leg below knee: Secondary | ICD-10-CM | POA: Diagnosis not present

## 2021-01-19 DIAGNOSIS — M1812 Unilateral primary osteoarthritis of first carpometacarpal joint, left hand: Secondary | ICD-10-CM | POA: Diagnosis not present

## 2021-01-24 DIAGNOSIS — Z8673 Personal history of transient ischemic attack (TIA), and cerebral infarction without residual deficits: Secondary | ICD-10-CM | POA: Diagnosis not present

## 2021-01-24 DIAGNOSIS — M1812 Unilateral primary osteoarthritis of first carpometacarpal joint, left hand: Secondary | ICD-10-CM | POA: Diagnosis not present

## 2021-01-24 DIAGNOSIS — I10 Essential (primary) hypertension: Secondary | ICD-10-CM | POA: Diagnosis not present

## 2021-01-24 DIAGNOSIS — Z9181 History of falling: Secondary | ICD-10-CM | POA: Diagnosis not present

## 2021-01-24 DIAGNOSIS — K644 Residual hemorrhoidal skin tags: Secondary | ICD-10-CM | POA: Diagnosis not present

## 2021-01-24 DIAGNOSIS — Z87891 Personal history of nicotine dependence: Secondary | ICD-10-CM | POA: Diagnosis not present

## 2021-01-24 DIAGNOSIS — Z4781 Encounter for orthopedic aftercare following surgical amputation: Secondary | ICD-10-CM | POA: Diagnosis not present

## 2021-01-24 DIAGNOSIS — Z89512 Acquired absence of left leg below knee: Secondary | ICD-10-CM | POA: Diagnosis not present

## 2021-01-24 DIAGNOSIS — E782 Mixed hyperlipidemia: Secondary | ICD-10-CM | POA: Diagnosis not present

## 2021-01-24 DIAGNOSIS — Z7902 Long term (current) use of antithrombotics/antiplatelets: Secondary | ICD-10-CM | POA: Diagnosis not present

## 2021-01-24 DIAGNOSIS — B0229 Other postherpetic nervous system involvement: Secondary | ICD-10-CM | POA: Diagnosis not present

## 2021-01-24 DIAGNOSIS — Z89431 Acquired absence of right foot: Secondary | ICD-10-CM | POA: Diagnosis not present

## 2021-01-24 DIAGNOSIS — K760 Fatty (change of) liver, not elsewhere classified: Secondary | ICD-10-CM | POA: Diagnosis not present

## 2021-01-24 DIAGNOSIS — K449 Diaphragmatic hernia without obstruction or gangrene: Secondary | ICD-10-CM | POA: Diagnosis not present

## 2021-01-24 DIAGNOSIS — E1151 Type 2 diabetes mellitus with diabetic peripheral angiopathy without gangrene: Secondary | ICD-10-CM | POA: Diagnosis not present

## 2021-01-24 DIAGNOSIS — M109 Gout, unspecified: Secondary | ICD-10-CM | POA: Diagnosis not present

## 2021-01-24 DIAGNOSIS — H539 Unspecified visual disturbance: Secondary | ICD-10-CM | POA: Diagnosis not present

## 2021-01-24 DIAGNOSIS — H409 Unspecified glaucoma: Secondary | ICD-10-CM | POA: Diagnosis not present

## 2021-01-24 DIAGNOSIS — I252 Old myocardial infarction: Secondary | ICD-10-CM | POA: Diagnosis not present

## 2021-01-26 DIAGNOSIS — S98922D Partial traumatic amputation of left foot, level unspecified, subsequent encounter: Secondary | ICD-10-CM | POA: Diagnosis not present

## 2021-01-26 DIAGNOSIS — I639 Cerebral infarction, unspecified: Secondary | ICD-10-CM | POA: Diagnosis not present

## 2021-01-26 DIAGNOSIS — E1151 Type 2 diabetes mellitus with diabetic peripheral angiopathy without gangrene: Secondary | ICD-10-CM | POA: Diagnosis not present

## 2021-01-26 DIAGNOSIS — I2101 ST elevation (STEMI) myocardial infarction involving left main coronary artery: Secondary | ICD-10-CM | POA: Diagnosis not present

## 2021-02-07 ENCOUNTER — Other Ambulatory Visit: Payer: Self-pay | Admitting: *Deleted

## 2021-02-07 NOTE — Patient Outreach (Signed)
[facility]) Care Management  02/07/2021  Elmo Kovanda 1940-07-18 [id number]   Outgoing call placed to member, state he is "alright."  Report he has not seen PT in a couple weeks. Call placed to [facility], notified that PT signed off on 8/10.  Spoke with niece regarding member's management of care.  State his son in law and other family has been helping out but she is still working on getting him additional resources.  Denies any urgent concerns, encouraged to contact this care manager with questions.  Agrees to follow up within the next month.   Goals Addressed             This Visit's Progress    THN - Find Help in My Community   On track    Timeframe:  Short-Term Goal Priority:  Medium Start Date:          6/10                   Expected End Date:  9/10  (goal extended)      Barriers: Health Behaviors Knowledge Support System    - follow-up on any referrals for help I am given    Why is this important?   Knowing how and where to find help for yourself or family in your neighborhood and community is an important skill.  You will want to take some steps to learn how.    Notes:   6/10 - Referral placed to Care Guide for transportation and meal resources  6/17 - Confirmed with care guide that meals has been set up.  Per chart, Home health has also started  6/29 - Report HH remains involved.  Niece has enlisted the help of friends/family to help care for member with meals, etc.  7/27 - Patient report he is still active with PT, will have visit today.  Call placed to niece to follow up on Medicaid, no answer.  8/24 - Niece has not been able to complete Medicaid application process but will work on it.  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  She will discuss PACE program with him.  If he is receptive, will place referral to program.     [facility] - Make and Keep All Appointments   On track    Timeframe:   Long-Range Goal Priority:  High Start Date:         6/10                    Expected End Date:    9/10                    Barriers: [place] Transportation    - call to cancel if needed - keep a calendar with appointment dates    Why is this important?   Part of staying healthy is seeing the doctor for follow-up care.  If you forget your appointments, there are some things you can do to stay on track.    Notes:   6/10 - Reviewed upcoming appointment with member and niece, discussed options for transportation resources  6/17 - attended ortho follow up on 6/13, has another one scheduled for 6/27.    6/29 - Attended follow up on 6/27, has staples removed, denies any open areas or drainage.  Next appointment scheduled for 7/18, member made aware  7/27 - Report completion of appointment with Dr. Sharol Given  on yesterday, follow up in October.  Unsure when his PCP appointment is, will discuss with niece  8/24 - Niece state member was seen in the office to get fitted for prosthetic leg a couple weeks ago, will have follow up next week with [employer].  Also has appointment with PCP on 9/9, she will have her sister provide transportation.  Follow up with Dr. Sharol Given on 10/4       Valente David, RN, MSN [place] Manager [phone]

## 2021-02-12 ENCOUNTER — Telehealth: Payer: Self-pay | Admitting: Orthopedic Surgery

## 2021-02-12 NOTE — Telephone Encounter (Signed)
Need specific medication that the patient wants as he has had lyrica and neurontin but cant take both together

## 2021-02-12 NOTE — Telephone Encounter (Signed)
Pt called requesting pain medication for pain in his feet. Please send to pharmacy on file. Pt phone number is [phone].

## 2021-02-13 ENCOUNTER — Other Ambulatory Visit: Payer: Self-pay | Admitting: Physician Assistant

## 2021-02-13 MED ORDER — GABAPENTIN 300 MG PO CAPS: 300.0000 mg | ORAL_CAPSULE | Freq: Every day | ORAL | 3 refills | Status: DC

## 2021-02-13 NOTE — Telephone Encounter (Signed)
No more narcotics too far out from surgery. Can refer to chronic pain management if he wants

## 2021-02-13 NOTE — Telephone Encounter (Signed)
Gabapentin reniewed

## 2021-02-13 NOTE — Telephone Encounter (Signed)
Patient aware this was called in for him  

## 2021-02-20 DIAGNOSIS — E11621 Type 2 diabetes mellitus with foot ulcer: Secondary | ICD-10-CM | POA: Diagnosis not present

## 2021-02-20 DIAGNOSIS — N179 Acute kidney failure, unspecified: Secondary | ICD-10-CM | POA: Diagnosis not present

## 2021-02-20 DIAGNOSIS — E785 Hyperlipidemia, unspecified: Secondary | ICD-10-CM | POA: Diagnosis not present

## 2021-02-20 DIAGNOSIS — E1169 Type 2 diabetes mellitus with other specified complication: Secondary | ICD-10-CM | POA: Diagnosis not present

## 2021-02-20 DIAGNOSIS — E1142 Type 2 diabetes mellitus with diabetic polyneuropathy: Secondary | ICD-10-CM | POA: Diagnosis not present

## 2021-02-20 DIAGNOSIS — M19041 Primary osteoarthritis, right hand: Secondary | ICD-10-CM | POA: Diagnosis not present

## 2021-02-20 DIAGNOSIS — M19049 Primary osteoarthritis, unspecified hand: Secondary | ICD-10-CM | POA: Diagnosis not present

## 2021-03-05 ENCOUNTER — Other Ambulatory Visit: Payer: Self-pay | Admitting: *Deleted

## 2021-03-05 NOTE — Patient Outreach (Signed)
[facility]) Care Management  [place]  03/05/2021   [name] [birth year] [id number]   Outgoing call placed to member.  Denies any urgent concerns, encouraged to contact this care manager with questions.    Encounter Medications:  Outpatient Encounter Medications as of 03/05/2021  Medication Sig Note   atorvastatin (LIPITOR) 80 MG tablet Take 80 mg by mouth daily.    brimonidine (ALPHAGAN) 0.2 % ophthalmic solution Place 1 drop into the right eye 2 (two) times daily.    clopidogrel (PLAVIX) 75 MG tablet Take 1 tablet (75 mg total) by mouth daily. 11/22/2020: Not clear when he took last.  Per Jan, RN note may not be taking his medications as ordered.   cycloSPORINE (RESTASIS) 0.05 % ophthalmic emulsion Place 1 drop into both eyes 2 (two) times daily.    Dorzolamide HCl-Timolol Mal PF 2-0.5 % SOLN Place 1 drop into both eyes 2 (two) times daily.    dorzolamidel-timolol (COSOPT) 22.3-6.8 MG/ML SOLN ophthalmic solution Place 1 drop into the right eye 2 (two) times daily.    gabapentin (NEURONTIN) 300 MG capsule Take 1 capsule (300 mg total) by mouth at bedtime.    hydroxypropyl methylcellulose / hypromellose (ISOPTO TEARS / GONIOVISC) 2.5 % ophthalmic solution Place 1 drop into both eyes 3 (three) times daily as needed for dry eyes.    ketorolac (ACULAR) 0.5 % ophthalmic solution Place 1 drop into both eyes daily.    oxyCODONE-acetaminophen (PERCOCET/ROXICET) 5-325 MG tablet Take 1 tablet by mouth every 4 (four) hours as needed.    traMADol (ULTRAM) 50 MG tablet Take 1 tablet (50 mg total) by mouth every 6 (six) hours as needed for moderate pain.    No facility-administered encounter medications on file as of 03/05/2021.    Functional Status:  In your present state of health, do you have any difficulty performing the following activities: 11/22/2020 11/22/2020  Hearing? - N  Vision? - Y  Difficulty concentrating or making decisions? - N  Walking or climbing  stairs? - N  Comment - -  Dressing or bathing? - N  Comment - -  Doing errands, shopping? N -  Some recent data might be hidden    Fall/Depression Screening: Fall Risk  11/24/2020 11/11/2018 07/28/2017  Falls in the past year? 0 0 No  Number falls in past yr: 0 0 -  Injury with Fall? 0 0 -  Risk for fall due to : Impaired balance/gait;Orthopedic patient - -  Follow up - Falls evaluation completed -   PHQ 2/9 Scores 11/24/2020 09/06/2014 08/23/2014 08/23/2014 08/23/2014  PHQ - 2 Score 0 0 0 0 0    Assessment:   Care Plan Care Plan : General Plan of Care (Adult)  Updates made by Valente David, RN since 03/05/2021 12:00 AM     Problem: Quality of Life (General Plan of Care)      Long-Range Goal: Quality of Life Maintained Completed 03/05/2021  Start Date: 11/24/2020  Expected End Date: 02/22/2021  Recent Progress: On track  Priority: Medium  Note:   Evidence-based guidance:  Assess patient's thoughts about quality of life, goals and expectations, and dissatisfaction or desire to improve.  Identify issues of primary importance such as mental health, illness, exercise tolerance, pain, sexual function and intimacy, cognitive change, social isolation, finances and relationships.  Assess and monitor for signs/symptoms of psychosocial concerns, especially depression or ideations regarding harm to others or self; provide or refer for mental health services as needed.  Identify  sensory issues that impact quality of life such as hearing loss, vision deficit; strategize ways to maintain or improve hearing, vision.  Promote access to services in the community to support independence such as support groups, home visiting programs, financial assistance, handicapped parking tags, durable medical equipment and emergency responder.  Promote activities to decrease social isolation such as group support or social, leisure and recreational activities, employment, use of social media; consider safety concerns  about being out of home for activities.  Provide patient an opportunity to share by storytelling or a "life review" to give positive meaning to life and to assist with coping and negative experiences.  Encourage patient to tap into hope to improve sense of self.  Counsel based on prognosis and as early as possible about end-of-life and palliative care; consider referral to palliative care provider.  Advocate for the development of palliative care plan that may include avoidance of unnecessary testing and intervention, symptom control, discontinuation of medications, hospice and organ donation.  Counsel as early as possible those with life-limiting chronic disease about palliative care; consider referral to palliative care provider.  Advocate for the development of palliative care plan.   Notes:     Task: Support and Maintain Acceptable Degree of Health, Comfort and Happiness Completed 03/05/2021  Due Date: 02/22/2021  Note:   Care Management Activities:    - self-expression encouraged - social relationships promoted - wellness behaviors promoted    Notes:       Goals Addressed             This Visit's Progress    [facility] - Find Help in My Community   On track    Timeframe:  Short-Term Goal Priority:  Medium Start Date:          6/10                   Expected End Date:  9/10  (goal extended)      Barriers: Health Behaviors Knowledge Support System    - follow-up on any referrals for help I am given    Why is this important?   Knowing how and where to find help for yourself or family in your neighborhood and community is an important skill.  You will want to take some steps to learn how.    Notes:   6/10 - Referral placed to Care Guide for transportation and meal resources  6/17 - Confirmed with care guide that meals has been set up.  Per chart, Home health has also started  6/29 - Report HH remains involved.  Niece has enlisted the help of friends/family to help care  for member with meals, etc.  7/27 - Patient report he is still active with PT, will have visit today.  Call placed to niece to follow up on Medicaid, no answer.  8/24 - Niece has not been able to complete Medicaid application process but will work on it.  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  She will discuss PACE program with him.  If he is receptive, will place referral to program  9/19 - Call placed to niece to follow up on PACE or other community programs as well as [facility], unsuccessful, will follow up     [facility] - Make and Keep All Appointments   On track    Timeframe:  Long-Range Goal Priority:  High Start Date:         6/10  Expected End Date:    12/10                    Barriers: Health Behaviors Support System Transportation    - call to cancel if needed - keep a calendar with appointment dates    Why is this important?   Part of staying healthy is seeing the doctor for follow-up care.  If you forget your appointments, there are some things you can do to stay on track.    Notes:   6/10 - Reviewed upcoming appointment with member and niece, discussed options for transportation resources  6/17 - attended ortho follow up on 6/13, has another one scheduled for 6/27.    6/29 - Attended follow up on 6/27, has staples removed, denies any open areas or drainage.  Next appointment scheduled for 7/18, member made aware  7/27 - Report completion of appointment with Dr. Sharol Given on yesterday, follow up in October.  Unsure when his PCP appointment is, will discuss with niece  8/24 - Niece state member was seen in the office to get fitted for prosthetic leg a couple weeks ago, will have follow up next week with [employer].  Also has appointment with PCP on 9/9, she will have her sister provide transportation.  Follow up with Dr. 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  Will follow up with ortho on 10/4.  State pain is better with gabapentin.  Does not follow up with PCP until January 2023        Plan:  Follow-up: Patient agrees to Care Plan and Follow-up. Follow-up in 1 month(s).  Valente David, [place], MSN [place] [phone]

## 2021-03-08 DIAGNOSIS — Z89512 Acquired absence of left leg below knee: Secondary | ICD-10-CM | POA: Diagnosis not present

## 2021-03-13 ENCOUNTER — Emergency Department (HOSPITAL_COMMUNITY): Payer: Medicare Other

## 2021-03-13 ENCOUNTER — Emergency Department (HOSPITAL_COMMUNITY)
Admission: EM | Admit: 2021-03-13 | Discharge: 2021-03-13 | Disposition: A | Discharge status: Home / Self Care | Payer: Medicare Other | Attending: Emergency Medicine | Admitting: Emergency Medicine

## 2021-03-13 ENCOUNTER — Other Ambulatory Visit: Payer: Self-pay

## 2021-03-13 ENCOUNTER — Encounter (HOSPITAL_COMMUNITY): Payer: Self-pay | Admitting: Emergency Medicine

## 2021-03-13 DIAGNOSIS — M899 Disorder of bone, unspecified: Secondary | ICD-10-CM

## 2021-03-13 DIAGNOSIS — R6889 Other general symptoms and signs: Secondary | ICD-10-CM | POA: Diagnosis not present

## 2021-03-13 DIAGNOSIS — C799 Secondary malignant neoplasm of unspecified site: Secondary | ICD-10-CM | POA: Diagnosis not present

## 2021-03-13 DIAGNOSIS — M47816 Spondylosis without myelopathy or radiculopathy, lumbar region: Secondary | ICD-10-CM | POA: Diagnosis not present

## 2021-03-13 DIAGNOSIS — M89252 Other disorders of bone development and growth, left femur: Secondary | ICD-10-CM | POA: Diagnosis not present

## 2021-03-13 DIAGNOSIS — I2510 Atherosclerotic heart disease of native coronary artery without angina pectoris: Secondary | ICD-10-CM | POA: Diagnosis not present

## 2021-03-13 DIAGNOSIS — M79661 Pain in right lower leg: Secondary | ICD-10-CM | POA: Diagnosis present

## 2021-03-13 DIAGNOSIS — Z743 Need for continuous supervision: Secondary | ICD-10-CM | POA: Diagnosis not present

## 2021-03-13 DIAGNOSIS — M898X8 Other specified disorders of bone, other site: Secondary | ICD-10-CM | POA: Diagnosis not present

## 2021-03-13 DIAGNOSIS — C7951 Secondary malignant neoplasm of bone: Secondary | ICD-10-CM | POA: Insufficient documentation

## 2021-03-13 DIAGNOSIS — F17290 Nicotine dependence, other tobacco product, uncomplicated: Secondary | ICD-10-CM | POA: Diagnosis not present

## 2021-03-13 DIAGNOSIS — Z79899 Other long term (current) drug therapy: Secondary | ICD-10-CM | POA: Diagnosis not present

## 2021-03-13 DIAGNOSIS — E119 Type 2 diabetes mellitus without complications: Secondary | ICD-10-CM | POA: Diagnosis not present

## 2021-03-13 DIAGNOSIS — M79651 Pain in right thigh: Secondary | ICD-10-CM | POA: Diagnosis not present

## 2021-03-13 DIAGNOSIS — I10 Essential (primary) hypertension: Secondary | ICD-10-CM | POA: Diagnosis not present

## 2021-03-13 DIAGNOSIS — I700 Atherosclerosis of aorta: Secondary | ICD-10-CM | POA: Diagnosis not present

## 2021-03-13 LAB — COMPREHENSIVE METABOLIC PANEL
ALT: 10 U/L (ref 0–44)
AST: 15 U/L (ref 15–41)
Albumin: 3.4 g/dL — ABNORMAL LOW (ref 3.5–5.0)
Alkaline Phosphatase: 57 U/L (ref 38–126)
Anion gap: 8 (ref 5–15)
BUN: 13 mg/dL (ref 8–23)
CO2: 26 mmol/L (ref 22–32)
Calcium: 9.4 mg/dL (ref 8.9–10.3)
Chloride: 100 mmol/L (ref 98–111)
Creatinine, Ser: 1.01 mg/dL (ref 0.61–1.24)
GFR, Estimated: >60 mL/min (ref >60)
Glucose, Bld: 96 mg/dL (ref 70–99)
Potassium: 4.2 mmol/L (ref 3.5–5.1)
Sodium: 134 mmol/L — ABNORMAL LOW (ref 135–145)
Total Bilirubin: 0.5 mg/dL (ref 0.3–1.2)
Total Protein: 8 g/dL (ref 6.5–8.1)

## 2021-03-13 LAB — CBC WITH DIFFERENTIAL/PLATELET
Abs Immature Granulocytes: 0.02 10*3/uL (ref 0.00–0.07)
Basophils Absolute: 0 10*3/uL (ref 0.0–0.1)
Basophils Relative: 1 %
Eosinophils Absolute: 0.7 10*3/uL — ABNORMAL HIGH (ref 0.0–0.5)
Eosinophils Relative: 11 %
HCT: 37.1 % — ABNORMAL LOW (ref 39.0–52.0)
Hemoglobin: 11.8 g/dL — ABNORMAL LOW (ref 13.0–17.0)
Immature Granulocytes: 0 %
Lymphocytes Relative: 16 %
Lymphs Abs: 1 10*3/uL (ref 0.7–4.0)
MCH: 27.9 pg (ref 26.0–34.0)
MCHC: 31.8 g/dL (ref 30.0–36.0)
MCV: 87.7 fL (ref 80.0–100.0)
Monocytes Absolute: 0.8 10*3/uL (ref 0.1–1.0)
Monocytes Relative: 13 %
Neutro Abs: 3.5 10*3/uL (ref 1.7–7.7)
Neutrophils Relative %: 59 %
Platelets: 316 10*3/uL (ref 150–400)
RBC: 4.23 MIL/uL (ref 4.22–5.81)
RDW: 16.6 % — ABNORMAL HIGH (ref 11.5–15.5)
WBC: 6.1 10*3/uL (ref 4.0–10.5)
nRBC: 0 % (ref 0.0–0.2)

## 2021-03-13 LAB — PSA: Prostatic Specific Antigen: 321 ng/mL — ABNORMAL HIGH (ref 0.00–4.00)

## 2021-03-13 MED ORDER — ACETAMINOPHEN 325 MG PO TABS
650.0000 mg | ORAL_TABLET | Freq: Once | ORAL | Status: AC
  Administered 2021-03-13: 650 mg via ORAL
  Filled 2021-03-13: qty 2

## 2021-03-13 MED ORDER — OXYCODONE-ACETAMINOPHEN 5-325 MG PO TABS
1.0000 | ORAL_TABLET | Freq: Once | ORAL | Status: DC
Start: 2021-03-13 — End: 2021-03-13

## 2021-03-13 MED ORDER — OXYCODONE-ACETAMINOPHEN 5-325 MG PO TABS
1.0000 | ORAL_TABLET | Freq: Once | ORAL | Status: AC
  Administered 2021-03-13: 1 via ORAL
  Filled 2021-03-13: qty 1

## 2021-03-13 MED ORDER — IOHEXOL 350 MG/ML SOLN
80.0000 mL | Freq: Once | INTRAVENOUS | Status: AC | PRN
  Administered 2021-03-13: 80 mL via INTRAVENOUS

## 2021-03-13 MED ORDER — OXYCODONE-ACETAMINOPHEN 5-325 MG PO TABS: 1.0000 | ORAL_TABLET | Freq: Four times a day (QID) | ORAL | 0 refills | Status: DC | PRN

## 2021-03-13 MED ORDER — MORPHINE SULFATE (PF) 2 MG/ML IV SOLN
2.0000 mg | Freq: Once | INTRAVENOUS | Status: AC
  Administered 2021-03-13: 2 mg via INTRAVENOUS
  Filled 2021-03-13: qty 1

## 2021-03-13 NOTE — ED Provider Notes (Signed)
[facility] EMERGENCY DEPARTMENT Provider Note   CSN: [id number] Arrival date & time: 03/13/21  1056     History Chief Complaint  Patient presents with   Leg Pain    [name] is a 80 y.o. male who presents with 2 days of severe right thigh pain.  States it has been achy for some time, however not bothering him in the last 2 days.  States he also developed today secondary to the pain.  Denies any numbness, tingling or weakness in the leg, or any other symptoms denies infectious symptoms. Patient states he is ambulatory at home with his prosthesis and a walker.   Negative personally reviewed patient's medical records.  History of peripheral vascular disease and right partial foot amputation history of hypertension as well and NSTEMI.  He is anticoagulated on aspirin and Plavix.  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 ([place]) 04/12/2020   Acute encephalopathy 03/20/2020   SIRS (systemic inflammatory response syndrome) ([place]) 03/20/2020   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[place]) 03/20/2020   ARF (acute renal failure) ([place]) 03/20/2020   Midfoot ulcer, left, limited to breakdown of skin ([place]) 12/29/2018   Occipital stroke ([place]) 10/31/2018   DM2 (diabetes mellitus, type 2) ([place]) 10/31/2018   NSTEMI (non-ST elevated myocardial infarction) ([place]) 05/28/2018   Syncope 05/28/2018   HTN (hypertension) 05/28/2018   Hyperlipemia, mixed 05/28/2018   Rash and nonspecific skin eruption 12/16/2017   Pain in right hand  09/11/2017   Carpal tunnel syndrome, right upper limb 07/01/2017   Arthritis of carpometacarpal [facility]) joint of left thumb 12/30/2016   Midfoot skin ulcer, right, limited to breakdown of skin ([place]) 07/22/2016   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/23/2014   Osteomyelitis of ankle or foot, left, acute ([place]) 08/23/2014   Cellulitis 10/12/2012   Chest pain 10/12/2012   Tobacco abuse 10/12/2012   ABSCESS, AXILLA, LEFT 02/23/2008   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   CHEST WALL PAIN, ACUTE 12/03/2007   CANDIDIASIS, GLANS PENIS 09/16/2007   HEADACHE 08/04/2007   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 ([place]) 03/05/2007   Other and unspecified hyperlipidemia 03/05/2007   GOUT 03/05/2007   ERECTILE DYSFUNCTION 03/05/2007   EXTERNAL HEMORRHOIDS 03/05/2007   VENTRAL HERNIA 03/05/2007   FATTY LIVER DISEASE 03/05/2007   Osteoarthritis 03/05/2007   HEMORRHOIDS, INTERNAL 03/17/2005   COLONIC POLYPS, HX OF 03/17/2005    Past Surgical History:  Procedure Laterality Date   AMPUTATION  07/23/2011   Procedure: AMPUTATION DIGIT;  Surgeon: Newt Minion, MD;  Location: [place];  Service: Orthopedics;  Laterality: Right;  Right Great Toe Amputation   AMPUTATION Right 09/18/2012   Procedure: Right Foot 2nd Ray Amputation;  Surgeon: Newt Minion, MD;  Location: [place];  Service: Orthopedics;  Laterality: Right;  Right Foot 2nd Ray Amputation   AMPUTATION Right 10/14/2012   Procedure:  AMPUTATION FOOT;  Surgeon: Newt Minion, MD;  Location: [place];  Service: Orthopedics;  Laterality: Right;  Right Foot Transmetatarsal Amputation   AMPUTATION Left 09/23/2014   Procedure: Left Foot Transmetatarsal Amputation;  Surgeon: Newt Minion, MD;  Location: [place];  Service: Orthopedics;  Laterality: Left;   AMPUTATION Left 11/22/2020   Procedure: LEFT BELOW KNEE AMPUTATION;  Surgeon: Newt Minion, MD;  Location: [place];  Service: Orthopedics;  Laterality:  Left;   BACK SURGERY     lower   CARPAL TUNNEL RELEASE Right 01/30/2018   Procedure: RIGHT CARPAL TUNNEL RELEASE;  Surgeon: Newt Minion, MD;  Location: [place];  Service: Orthopedics;  Laterality: Right;   CIRCUMCISION  2010   COLONOSCOPY     FOOT FRACTURE SURGERY Left 1970's   "broke it playing football" (10/12/2012)   HUMERUS FRACTURE SURGERY W/ IMPLANT Right 1960's   "put a plate in it" (7/67/2094)   I & D EXTREMITY Right 03/24/2020   Procedure: EXCISION RIGHT MEDIAL CUNEFORM;  Surgeon: Newt Minion, MD;  Location: [place];  Service: Orthopedics;  Laterality: Right;   INCISION AND DRAINAGE OF WOUND Left 2006   "foot" (10/12/2012)   [place] SURGERY  2008       Family History  Problem Relation Age of Onset   Diabetes Mellitus II Other    Anesthesia problems Neg Hx     Social History   Tobacco Use   Smoking status: Every Day    Types: Pipe   Smokeless tobacco: Never   Tobacco comments:    Uses 2-3 barrels a day.  Vaping Use   Vaping Use: Never used  Substance Use Topics   Alcohol use: No    Alcohol/week: 0.0 standard drinks   Drug use: No    Home Medications Prior to Admission medications   Medication Sig Start Date End Date Taking? Authorizing Provider  oxyCODONE-acetaminophen (PERCOCET/ROXICET) 5-325 MG tablet Take 1 tablet by mouth every 6 (six) hours as needed for severe pain. 03/13/21  Yes Delora Gravatt R, PA-C  atorvastatin (LIPITOR) 80 MG tablet Take 80 mg by mouth daily.    [provider]  brimonidine (ALPHAGAN) 0.2 % ophthalmic solution Place 1 drop into the right eye 2 (two) times daily.    [provider]  clopidogrel (PLAVIX) 75 MG tablet Take 1 tablet (75 mg total) by mouth daily. 11/01/18   Mariel Aloe, MD  cycloSPORINE (RESTASIS) 0.05 % ophthalmic emulsion Place 1 drop into both eyes 2 (two) times daily.    [provider]  Dorzolamide HCl-Timolol Mal PF 2-0.5 % SOLN Place 1 drop into both eyes 2 (two) times daily.  03/28/20   Aline August, MD  dorzolamidel-timolol (COSOPT) 22.3-6.8 MG/ML SOLN ophthalmic solution Place 1 drop into the right eye 2 (two) times daily.    [provider]  gabapentin (NEURONTIN) 300 MG capsule Take 1 capsule (300 mg total) by mouth at bedtime. 02/13/21   Persons, Bevely Palmer, PA  hydroxypropyl methylcellulose / hypromellose (ISOPTO TEARS / GONIOVISC) 2.5 % ophthalmic solution Place 1 drop into both eyes 3 (three) times daily as needed for dry eyes. 09/01/20   Valarie Merino, MD  ketorolac (ACULAR) 0.5 % ophthalmic solution Place 1 drop into both eyes daily.    [provider]    Allergies    Patient has no known allergies.  Review of Systems   Review of Systems  Constitutional: Negative.   HENT: Negative.    Respiratory:  Negative.    Cardiovascular: Negative.   Gastrointestinal: Negative.   Genitourinary:  Positive for frequency. Negative for decreased urine volume, dysuria, flank pain and urgency.  Musculoskeletal:  Positive for myalgias.  Neurological: Negative.    Physical Exam Updated Vital Signs BP 125/86 (BP Location: Right Arm)   Pulse (!) 58   Temp 98.3 F (36.8 C) (Oral)   Resp 18   SpO2 100%   Physical Exam Vitals and nursing note reviewed.  Constitutional:      Appearance: He is normal weight. He is not ill-appearing or toxic-appearing.  HENT:     Head: Normocephalic and atraumatic.     Nose: Nose normal. No congestion.     Mouth/Throat:     Mouth: Mucous membranes are moist.     Pharynx: Oropharynx is clear. Uvula midline. No oropharyngeal exudate or posterior oropharyngeal erythema.  Eyes:     General: Lids are normal. Vision grossly intact.        Right eye: No discharge.        Left eye: No discharge.     Conjunctiva/sclera: Conjunctivae normal.     Pupils: Pupils are equal, round, and reactive to light.  Neck:     Trachea: Trachea and phonation normal.  Cardiovascular:     Rate and Rhythm: Normal rate and regular  rhythm.     Pulses: Normal pulses.     Heart sounds: Normal heart sounds. No murmur heard. Pulmonary:     Effort: Pulmonary effort is normal. No tachypnea, bradypnea, accessory muscle usage, prolonged expiration or respiratory distress.     Breath sounds: Normal breath sounds. No wheezing or rales.  Chest:     Chest wall: No mass, lacerations, deformity, swelling, tenderness, crepitus or edema.  Abdominal:     General: Bowel sounds are normal. There is no distension.     Palpations: Abdomen is soft.     Tenderness: There is no abdominal tenderness. There is no right CVA tenderness, left CVA tenderness, guarding or rebound.  Musculoskeletal:        General: No deformity.     Cervical back: Normal range of motion and neck supple. No edema, rigidity, bony tenderness or crepitus. No pain with movement, spinous process tenderness or muscular tenderness.     Thoracic back: No bony tenderness.     Lumbar back: Normal range of motion.     Right lower leg: No edema.     Left lower leg: No edema.       Legs:  Lymphadenopathy:     Cervical: No cervical adenopathy.  Skin:    General: Skin is warm and dry.     Capillary Refill: Capillary refill takes less than 2 seconds.  Neurological:     General: No focal deficit present.     Mental Status: He is alert and oriented to person, place, and time. Mental status is at baseline.  Psychiatric:        Mood and Affect: Mood normal.    ED Results / Procedures / Treatments   Labs (all labs ordered are listed, but only abnormal results are displayed) Labs Reviewed  COMPREHENSIVE METABOLIC PANEL - Abnormal; Notable for the following components:      Result Value   Sodium 134 (*)    Albumin 3.4 (*)    All other components within normal limits  CBC WITH DIFFERENTIAL/PLATELET - Abnormal; Notable for the following components:   Hemoglobin 11.8 (*)    HCT 37.1 (*)    RDW 16.6 (*)  Eosinophils Absolute 0.7 (*)    All other components within normal  limits  PSA - Abnormal; Notable for the following components:   Prostatic Specific Antigen 321.00 (*)    All other components within normal limits  URINALYSIS, ROUTINE W REFLEX MICROSCOPIC    EKG None  Radiology CT CHEST ABDOMEN PELVIS W CONTRAST  Result Date: 03/13/2021 CLINICAL DATA:  Concern for malignancy.  Lytic lesion right femur. EXAM: CT CHEST, ABDOMEN, AND PELVIS WITH CONTRAST TECHNIQUE: Multidetector CT imaging of the chest, abdomen and pelvis was performed following the standard protocol during bolus administration of intravenous contrast. CONTRAST:  50mL OMNIPAQUE IOHEXOL 350 MG/ML SOLN COMPARISON:  Right femur series 03/13/2021.  CT 11/01/2005 FINDINGS: CT CHEST FINDINGS Cardiovascular: Heart is borderline in size. Coronary artery and aortic calcifications. No evidence of aortic aneurysm. Mediastinum/Nodes: No mediastinal, hilar, or axillary adenopathy. Trachea and esophagus are unremarkable. Thyroid unremarkable. Lungs/Pleura: Lungs are clear. No focal airspace opacities or suspicious nodules. No effusions. Musculoskeletal: Chest wall soft tissues are unremarkable. No acute bony abnormality or focal bone lesion. CT ABDOMEN PELVIS FINDINGS Hepatobiliary: No focal hepatic abnormality. Gallbladder unremarkable. Pancreas: No focal abnormality or ductal dilatation. Spleen: No focal abnormality.  Normal size. Adrenals/Urinary Tract: No adrenal abnormality. No focal renal abnormality. No stones or hydronephrosis. Urinary bladder is unremarkable. Stomach/Bowel: Normal appendix. Stomach, large and small bowel grossly unremarkable. Vascular/Lymphatic: Bulky retroperitoneal adenopathy. Index right retroperitoneal lymph node on image 74 measures 3.5 x 3.3 cm. Large left external iliac lymph node measures 4.7 x 3.8 cm on image 102. Right iliac adenopathy also noted. Borderline sized inguinal lymph nodes, the largest in the left inguinal region having a short axis diameter of 10 mm. Scattered aortic  atherosclerosis. No aneurysm. Reproductive: Enlarged prostate with lobular contours. Other: No free fluid or free air. Musculoskeletal: Lytic lesion again seen within the right femoral neck as seen on plain films. Lucent areas are also noted in the lumbar spine. While these could reflect focal lytic lesions, osteopenia also possibility. Degenerative changes in the lumbar spine. IMPRESSION: No acute cardiopulmonary disease. Coronary artery disease. Bulky retroperitoneal adenopathy in the periaortic regions as well as pelvic sidewall. Appearance is concerning for metastatic disease or lymphoma. Enlarged, lobular prostate. Recommend correlation with PSA and physical exam. Lytic lesion within the femoral neck as seen on plain films. Lucent areas are also seen in the lumbar spine which could reflect focal lucent lesions or osteopenia. Consider further evaluation with nuclear medicine bone scan. Electronically Signed   By: Rolm Baptise M.D.   On: 03/13/2021 16:22   DG Femur Min 2 Views Right  Result Date: 03/13/2021 CLINICAL DATA:  RIGHT femur pain for 2 days, no known injury, pain from RIGHT hip to RIGHT knee EXAM: RIGHT FEMUR 2 VIEWS COMPARISON:  None FINDINGS: Osseous mineralization normal. Mild narrowing of knee joint spaces. RIGHT hip joint space preserved. No fracture or dislocation. Subtle lucency within the RIGHT femoral neck, 3.3 x 3.0 cm, suspicious for focal lytic lesion such as a lytic metastasis. Scattered atherosclerotic calcifications. IMPRESSION: Suspect subtle lytic lesion within RIGHT femoral neck, concerning for area of bone destruction such as from a lytic metastasis; further assessment by MR imaging recommended to exclude lytic lesion and or pathologic fracture. Findings called to Silverio Decamp PA on 03/13/2021 at 1234 hrs. Electronically Signed   By: Lavonia Dana M.D.   On: 03/13/2021 12:34    Procedures Procedures   Medications Ordered in ED Medications  acetaminophen (TYLENOL) tablet  650 mg (650 mg  Oral Given 03/13/21 1239)  morphine 2 MG/ML injection 2 mg (2 mg Intravenous Given 03/13/21 1349)  iohexol (OMNIPAQUE) 350 MG/ML injection 80 mL (80 mLs Intravenous Contrast Given 03/13/21 1604)  oxyCODONE-acetaminophen (PERCOCET/ROXICET) 5-325 MG per tablet 1 tablet (1 tablet Oral Given 03/13/21 1830)    ED Course  I have reviewed the triage vital signs and the nursing notes.  Pertinent labs & imaging results that were available during my care of the patient were reviewed by me and considered in my medical decision making (see chart for details).  Clinical Course as of 03/13/21 1915  Tue Mar 13, 2021  1234 Call from radiologist regarding femur films.  Concern for ~3 x 3 cm lytic lesion concerning for metastasis.  Patient without known primary malignancy.  Per radiologist recommend MRI of the femur for further evaluation of possible underlying pathologic fracture.  I appreciate his collaboration in the care of this patient [RS]  1657 Consult to urologist, Dr. Louis Meckel, who tried the patient's information and is agreeable to seeing him in the office in the very short interim for further discussion of work-up and management.  I appreciate his collaboration care of this patient. [RS]  7793 Patient's case and disposition plan discussed with his legal guardian, [name], who will present to the emergency department to pick up the patient. [RS]    Clinical Course User Index [RS] Mackensey Bolte, Sharlene Dory   MDM Rules/Calculators/A&P                         80 year old male who presents with concern for right upper leg pain.   Differential diagnosis includes but is not limited to acute fracture or dislocation, cellulitis, muscular injury.   VS are normal on intake, cardiopulmonary exam is normal. Abdominal exam is benign. Patient without prosthesis or walker in the ED. In wheelchair. TTP over femur on the right as above.   Call received from radiologist for concern for lytic  lesion of the femoral neck on plain film.  Patient without known malignancy; will proceed with CT CAP and labs.   CBC with mild anemia with hemoglobin of 11.8, otherwise unremarkable.  CMP unremarkable.  PSA significantly elevated to 321.  CT CAP with bulky retroperitoneal adenopathy as well as lytic lesions previously in the femoral neck and lucent areas of the lumbar spine concerning for possible lytic lesions.  Additionally prostate is enlarged and lobular.  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  Case discussed with urology as above.  Given patient is hemodynamically stable, no indication for admission at this time.  Dr. Louis Meckel from urology will plan to see the patient this week in the office.  He will contact the patient.  [place] and his legal guardian voiced understanding with medical evaluation and treatment plan.  Each of their questions answered to their expressed satisfaction.  Return precautions given.  Patient is well-appearing, stable, and appropriate for discharge at this time.  This chart was dictated using voice recognition software, Dragon. Despite the best efforts of this provider to proofread and correct errors, errors may still occur which can change documentation meaning.   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

## 2021-03-13 NOTE — ED Triage Notes (Signed)
Patient coming from home complaint of right thigh pain that started 2 days ago, worse last night. VSS. NAD.

## 2021-03-13 NOTE — ED Notes (Signed)
Patient transported to CT 

## 2021-03-13 NOTE — ED Notes (Signed)
Patient verbalizes understanding of discharge instructions. Prescriptions and follow-up care reviewed with pt and family member. Opportunity for questioning and answers were provided. Armband removed by staff, pt discharged from ED via wheelchair.

## 2021-03-13 NOTE — Discharge Instructions (Addendum)
You were seen today for your hip pain. Unfortunately your blood work and CT scans were concerning for likely new cancer, possibly from your prostate. You have been referred to the urologist listed below, Dr. Louis Meckel.  He should call you, but please call his office tomorrow afternoon if you have not heard from them.   You will need to see him in the office in the next 48 hours. Please call his office tomorrow afternoon.   You have been prescribed pain medication to take as needed for your hip pain. Return to the ER with any worsening pain, nausea, vomiting,  or any other new severe symptoms.

## 2021-03-13 NOTE — ED Provider Notes (Signed)
  Face-to-face evaluation   History: He presents for evaluation of ongoing right upper leg pain, which is gradually worsening.  He is able to ambulate when he has a prosthesis on his left leg.  Presents by EMS.  No recent fall.  He denies weight loss.  He is not having anorexia.  He denies pain in neck, upper or lower back.  Physical exam: Alert elderly male, sitting in a wheelchair, comfortably.  He is able to elevate his right upper leg off the wheelchair, but moves it gingerly.  Normal range of motion of left leg, which is notable for a BKA.  Medical screening examination/treatment/procedure(s) were conducted as a shared visit with non-physician practitioner(s) and myself.  I personally evaluated the patient during the encounter    Daleen Bo, MD 03/14/21 [phone]

## 2021-03-13 NOTE — ED Provider Notes (Signed)
Emergency Medicine Provider Triage Evaluation Note  [name] , a 80 y.o. male  was evaluated in triage.  Pt complains of 2 days of right upper pain without numbness, ting, fevers, or chills.  History of peripheral artery disease.  Compliant with aspirin and Plavix.  No history of trauma to the leg.  Ambulatory with prosthesis and walker  Review of Systems  Positive: Right leg pain Negative: Fevers, chills, nausea, vomiting, chest pain, shortness of breath  Physical Exam  There were no vitals taken for this visit. Gen:   Awake, no distress   Resp:  Normal effort  MSK:   Moves extremities without difficulty  Other:  Tenderness palpation of the anterior and posterior right thigh, unable to visualize skin due to patient's layers of clothing.  Leg warm to the touch, 1+ PT pulse  Medical Decision Making  Medically screening exam initiated at 11:31 AM.  Appropriate orders placed.  [name] was informed that the remainder of the evaluation will be completed by another provider, this initial triage assessment does not replace that evaluation, and the importance of remaining in the ED until their evaluation is complete.  This chart was dictated using voice recognition software, Dragon. Despite the best efforts of this provider to proofread and correct errors, errors may still occur which can change documentation meaning.    [name] 03/13/21 1133    Daleen Bo, MD 03/13/21 Jeri Lager

## 2021-03-20 ENCOUNTER — Ambulatory Visit: Payer: Medicare Other | Admitting: Orthopedic Surgery

## 2021-03-20 DIAGNOSIS — Z89619 Acquired absence of unspecified leg above knee: Secondary | ICD-10-CM | POA: Diagnosis not present

## 2021-03-20 DIAGNOSIS — Z23 Encounter for immunization: Secondary | ICD-10-CM | POA: Diagnosis not present

## 2021-03-20 DIAGNOSIS — G629 Polyneuropathy, unspecified: Secondary | ICD-10-CM | POA: Diagnosis not present

## 2021-03-20 DIAGNOSIS — R2681 Unsteadiness on feet: Secondary | ICD-10-CM | POA: Diagnosis not present

## 2021-03-20 DIAGNOSIS — M899 Disorder of bone, unspecified: Secondary | ICD-10-CM | POA: Diagnosis not present

## 2021-03-20 DIAGNOSIS — R300 Dysuria: Secondary | ICD-10-CM | POA: Diagnosis not present

## 2021-03-21 ENCOUNTER — Telehealth: Payer: Self-pay | Admitting: Orthopedic Surgery

## 2021-03-21 NOTE — Telephone Encounter (Signed)
[name] niece [name] called requesting Dr. Sharol Given or [place] [facility] send orders for home health physical therapy. Please call [name] at [phone].

## 2021-03-23 ENCOUNTER — Telehealth: Payer: Self-pay | Admitting: Orthopedic Surgery

## 2021-03-23 ENCOUNTER — Other Ambulatory Visit: Payer: Self-pay

## 2021-03-23 DIAGNOSIS — Z89512 Acquired absence of left leg below knee: Secondary | ICD-10-CM

## 2021-03-23 NOTE — Telephone Encounter (Signed)
I called and sw pt's daughter and she advised that they do have the prosthetic from [facility] and will need PT referral for gait training. This order was entered today and advised that someone will call to schedule.

## 2021-03-23 NOTE — Telephone Encounter (Signed)
Patient states that he is unable to get in his wheelchair and go out for physical therapy. He would like someone to come to his home for PT

## 2021-03-27 NOTE — Telephone Encounter (Signed)
Please see message below. Typed it out and forgot to add recipient  Thanks!

## 2021-03-27 NOTE — Telephone Encounter (Signed)
I entered a referral for this pt to have physical therapy. Once he is set up can you let me know so that I can call transportation and get him set up for services. He said that he wants to do the out patient therapy he just does not have transportation.

## 2021-03-27 NOTE — Telephone Encounter (Signed)
I called pt and he lacks the transportation to physical therapy. I advised that we can set this up through [facility] and he said that yes this would be helpful and he would be able to make his appts. Will check with therapy for first appt and will set up once scheduled.

## 2021-03-28 ENCOUNTER — Other Ambulatory Visit: Payer: Self-pay

## 2021-03-28 ENCOUNTER — Encounter: Payer: Medicare Other | Admitting: Physical Therapy

## 2021-03-28 NOTE — Telephone Encounter (Signed)
[facility] can not accept this referral. Info faxed to [facility] [phone] to see if they can staff this request. Pt has used them before in the past. Will hold pending advisement.

## 2021-03-28 NOTE — Telephone Encounter (Signed)
Message sent ot [facility] to see if they can take referral for PT eval and treat for upper body strengthening, safety eval and prosthetic training for ADLs inside home? Will hold this message pending advisement.

## 2021-03-30 NOTE — Telephone Encounter (Signed)
Will accept referral.

## 2021-04-02 ENCOUNTER — Other Ambulatory Visit: Payer: Self-pay | Admitting: *Deleted

## 2021-04-02 NOTE — Patient Outreach (Signed)
[name]) Care Management  04/02/2021  [name] 24-Dec-1940 [id number]   Outgoing call placed to member's niece (request made to contact niece only, not patient), no answer, HIPAA compliant voice message left.  Will follow up within the next 3-4 business days.  Valente David, [place], MSN [place] [phone]

## 2021-04-05 ENCOUNTER — Other Ambulatory Visit: Payer: Self-pay | Admitting: *Deleted

## 2021-04-05 ENCOUNTER — Telehealth: Payer: Self-pay | Admitting: Orthopedic Surgery

## 2021-04-05 NOTE — Patient Outreach (Addendum)
[facility]) Care Management  04/05/2021  Karry Causer April 26, 1941 [id number]   Outreach attempt #2, unsuccessful to niece, HIPAA compliant voice message left.  Will send outreach letter and follow up within the next 3-4 business days.     Update:  Incoming call received back from niece.  State although member is not compliant with medication management he is doing "better."  Was seen by PCP and diagnosed with UTI, now on antibiotics.  She will continue to talk to him regarding PACE or adult day care programs.  Denies any urgent concerns, encouraged to contact this care manager with questions.  Agrees to follow up within the next month.    Goals Addressed             This Visit's Progress    THN - Find Help in My Community evidenced by start of in home bath aide   On track    Timeframe:  Short-Term Goal Priority:  Medium Start Date:          10/20              Expected End Date:  07/06/2021  (goal reset)      Barriers: Health Behaviors Knowledge Support System    - follow-up on any referrals for help I am given    Why is this important?   Knowing how and where to find help for yourself or family in your neighborhood and community is an important skill.  You will want to take some steps to learn how.    Notes:   6/10 - Referral placed to Care Guide for transportation and meal resources  6/17 - Confirmed with care guide that meals has been set up.  Per chart, Home health has also started  6/29 - Report HH remains involved.  Niece has enlisted the help of friends/family to help care for member with meals, etc.  7/27 - Patient report he is still active with PT, will have visit today.  Call placed to niece to follow up on Medicaid, no answer.  8/24 - Niece has not been able to complete Medicaid application process but will work on it.  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  She will discuss PACE  program with him.  If he is receptive, will place referral to program  9/19 - Call placed to niece to follow up on PACE or other community programs as well as [employer], unsuccessful, will follow up  10/20 - Niece report member has refused to consider PACE or any other community resources.  She is looking to hire a home health bath aide a couple days a week.  List or agencies emailed to niece.     THN - Make and Keep All Appointments   On track    Timeframe:  Long-Range Goal Priority:  High Start Date:         6/10                    Expected End Date:    12/10                    Barriers: [place] Transportation    - call to cancel if needed - keep a calendar with appointment dates    Why is this important?   Part of staying healthy is seeing the doctor for follow-up care.  If you forget your appointments, there are  some things you can do to stay on track.    Notes:   6/10 - Reviewed upcoming appointment with member and niece, discussed options for transportation resources  6/17 - attended ortho follow up on 6/13, has another one scheduled for 6/27.    6/29 - Attended follow up on 6/27, has staples removed, denies any open areas or drainage.  Next appointment scheduled for 7/18, member made aware  7/27 - Report completion of appointment with Dr. Sharol Given on yesterday, follow up in October.  Unsure when his PCP appointment is, will discuss with niece  8/24 - Niece state member was seen in the office to get fitted for prosthetic leg a couple weeks ago, will have follow up next week with [employer].  Also has appointment with PCP on 9/9, she will have her sister provide transportation.  Follow up with Dr. Sharol Given on 10/4  9/19 - Report he has to go back on 9/22 to be fitted again for his prosthesis, anticipating PT once he has device.  Will follow up with ortho on 10/4.  State pain is better with gabapentin.  Does not follow up with PCP until January 2023  10/20 -  Member has received new prosthesis, now in need of PT.  Per MD note, [name] has accepted member however family and member has not been in contact with them.  Call placed to [facility], notified they do not have a referral.  Verified fax number (same as in MD's note that was faxed on 10/12), call placed to MD office (Dr. Sharol Given) request made to re-fax orders.  Niece made aware.  Member has appointment at eye doctor on 10/24 and with Dr. Sharol Given on 10/31.         Valente David, [place], MSN [place] [phone]

## 2021-04-05 NOTE — Telephone Encounter (Signed)
Holding for [name]

## 2021-04-05 NOTE — Telephone Encounter (Signed)
Monica from [employer] called. Says family has not heard back from [facility]. [name] says they did not receive the RX for therapy and would like it faxed again.

## 2021-04-06 NOTE — Telephone Encounter (Signed)
This order was faxed will hold to follow up on Monday

## 2021-04-09 DIAGNOSIS — H401133 Primary open-angle glaucoma, bilateral, severe stage: Secondary | ICD-10-CM | POA: Diagnosis not present

## 2021-04-09 DIAGNOSIS — Z961 Presence of intraocular lens: Secondary | ICD-10-CM | POA: Diagnosis not present

## 2021-04-09 DIAGNOSIS — H04123 Dry eye syndrome of bilateral lacrimal glands: Secondary | ICD-10-CM | POA: Diagnosis not present

## 2021-04-09 DIAGNOSIS — E119 Type 2 diabetes mellitus without complications: Secondary | ICD-10-CM | POA: Diagnosis not present

## 2021-04-10 ENCOUNTER — Telehealth: Payer: Self-pay

## 2021-04-10 NOTE — Telephone Encounter (Signed)
Can you please call pt and make an appt? Anytime that he is available. [facility] will not accept the referral for physical therapy without having a recent face to face visit. Thanks!

## 2021-04-10 NOTE — Telephone Encounter (Signed)
Email received.

## 2021-04-10 NOTE — Telephone Encounter (Signed)
Called ot confirm that referral fax was received and spoke with Clarise Cruz. Fax was not received and asked if there was another fax I could send it to (it's been sent twice) advised that I can email to [email or web address] I sent this and asked for confirmation that it has been received.

## 2021-04-12 ENCOUNTER — Encounter (HOSPITAL_COMMUNITY): Payer: Self-pay | Admitting: Radiology

## 2021-04-16 ENCOUNTER — Encounter: Payer: Self-pay | Admitting: Orthopedic Surgery

## 2021-04-16 ENCOUNTER — Other Ambulatory Visit: Payer: Self-pay

## 2021-04-16 ENCOUNTER — Ambulatory Visit (INDEPENDENT_AMBULATORY_CARE_PROVIDER_SITE_OTHER): Payer: Medicare Other | Admitting: Orthopedic Surgery

## 2021-04-16 DIAGNOSIS — Z89512 Acquired absence of left leg below knee: Secondary | ICD-10-CM | POA: Diagnosis not present

## 2021-04-16 NOTE — Progress Notes (Signed)
Office Visit Note   Patient: [name]           Date of Birth: 1941-06-17           MRN: [id number] Visit Date: 04/16/2021              Requested by: [name], MD 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  He is just about 5 months out from surgery he is working with Museum/gallery curator for his prosthesis.  Patient is still homebound.  Assessment & Plan: Visit Diagnoses:  1. S/P BKA (below knee amputation) unilateral, left ([place])     Plan: Patient will need a home health aide as well as home health therapy.  Anticipate patient will need an aide at least 2 times a week for bathing.  Needs home therapy for gait training.  Follow-Up Instructions: Return in about 3 months (around 07/17/2021).   Ortho Exam  Patient is alert, oriented, no adenopathy, well-dressed, normal affect, normal respiratory effort. Examination patient has a well-healed left transtibial amputation he has full extension there is no ulceration there is good consolidation.  Imaging: No results found. No images are attached to the encounter.  Labs: Lab Results  Component Value Date   HGBA1C 5.7 (H) 03/21/2020   HGBA1C 5.4 11/01/2018   HGBA1C 5.2 05/29/2018   ESRSEDRATE 50 (H) 03/20/2020   ESRSEDRATE 50 (H) 03/17/2020   ESRSEDRATE 34 (H) 12/17/2017   CRP 13.5 (H) 03/25/2020   CRP 0.8 03/17/2020   CRP 17.1 (H) 12/17/2017   REPTSTATUS 03/25/2020 FINAL 03/19/2020   REPTSTATUS 03/25/2020 FINAL 03/19/2020   REPTSTATUS 03/21/2020 FINAL 03/19/2020   GRAMSTAIN  03/17/2020    FEW WBC PRESENT, PREDOMINANTLY PMN FEW GRAM POSITIVE COCCI    CULT  03/19/2020    NO GROWTH 5 DAYS Performed at [facility], [place] [address], [place], [place]  [address]    CULT  03/19/2020    NO GROWTH 5 DAYS Performed at [facility] [address] [place], [place] [address]    CULT (A) 03/19/2020    <10,000 COLONIES/mL INSIGNIFICANT GROWTH Performed at [facility] [address], [place], [place] [address]    LABORGA METHICILLIN RESISTANT STAPHYLOCOCCUS AUREUS 03/17/2020     Lab Results  Component Value Date   ALBUMIN 3.4 (L) 03/13/2021   ALBUMIN 3.5 03/19/2020   ALBUMIN 3.7 02/05/2020    Lab Results  Component Value Date   MG 1.9 03/27/2020   MG 1.6 (L) 03/26/2020   MG 1.4 (L) 03/25/2020   No results found for: VD25OH  No results found for: PREALBUMIN CBC EXTENDED Latest Ref Rng & Units 03/13/2021 11/23/2020 11/22/2020  WBC 4.0 - 10.5 K/uL 6.1 12.6(H) 8.3  RBC 4.22 - 5.81 MIL/uL 4.23 3.52(L) 3.77(L)  HGB 13.0 - 17.0 g/dL 11.8(L) 9.9(L) 10.7(L)  HCT 39.0 - 52.0 % 37.1(L) 30.0(L) 33.0(L)  PLT 150 - 400 K/uL 316 293 292  NEUTROABS 1.7 - 7.7 K/uL 3.5 - -  LYMPHSABS 0.7 - 4.0 K/uL 1.0 - -     There is no height or weight on file to calculate BMI.  Orders:  No orders of the defined types were placed in  this encounter.  No orders of the defined types were placed in this encounter.    Procedures: No procedures performed  Clinical Data: No additional findings.  ROS:  All other systems negative, except as noted in the HPI. Review of Systems  Objective: Vital Signs: There were no vitals taken for this visit.  Specialty Comments:  No specialty comments available.  PMFS History: Patient Active Problem List   Diagnosis Date Noted   Abscess of bursa of left ankle 11/22/2020   Subacute osteomyelitis, left ankle and foot ([place])    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 ([place]) 04/12/2020   Acute encephalopathy 03/20/2020   SIRS (systemic inflammatory response syndrome) ([place]) 03/20/2020   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[place]) 03/20/2020   ARF (acute renal failure) ([place]) 03/20/2020   Midfoot ulcer,  left, limited to breakdown of skin ([place]) 12/29/2018   Occipital stroke ([place]) 10/31/2018   DM2 (diabetes mellitus, type 2) ([place]) 10/31/2018   NSTEMI (non-ST elevated myocardial infarction) ([place]) 05/28/2018   Syncope 05/28/2018   HTN (hypertension) 05/28/2018   Hyperlipemia, mixed 05/28/2018   Rash and nonspecific skin eruption 12/16/2017   Pain in right hand 09/11/2017   Carpal tunnel syndrome, right upper limb 07/01/2017   Arthritis of carpometacarpal [facility]) joint of left thumb 12/30/2016   Midfoot skin ulcer, right, limited to breakdown of skin ([place]) 07/22/2016   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[place]) 09/23/2014   Osteomyelitis of ankle or foot, left, acute ([place]) 08/23/2014   Cellulitis 10/12/2012   Chest pain 10/12/2012   Tobacco abuse 10/12/2012   ABSCESS, AXILLA, LEFT 02/23/2008   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   CHEST WALL PAIN, ACUTE 12/03/2007   CANDIDIASIS, GLANS PENIS 09/16/2007   HEADACHE 08/04/2007   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 ([place]) 03/05/2007   Other and unspecified hyperlipidemia 03/05/2007   GOUT 03/05/2007   ERECTILE DYSFUNCTION 03/05/2007   EXTERNAL HEMORRHOIDS 03/05/2007   VENTRAL HERNIA 03/05/2007   FATTY LIVER DISEASE 03/05/2007   Osteoarthritis 03/05/2007   HEMORRHOIDS, INTERNAL 03/17/2005   COLONIC POLYPS, HX OF 03/17/2005   Past Medical History:  Diagnosis Date   Arthritis    Denies   Glaucoma    High cholesterol    Hypertension    denies   Osteomyelitis ([place])    Right great toe   Peripheral vascular disease ([place])    diabetic  with osteomylitis   Pre-diabetes    Stroke (cerebrum) ([place]) 10/2018    Family History  Problem Relation Age of Onset   Diabetes Mellitus II Other    Anesthesia problems Neg Hx     Past Surgical History:  Procedure Laterality Date   AMPUTATION  07/23/2011   Procedure: AMPUTATION DIGIT;  Surgeon: Newt Minion, MD;  Location: [facility];   Service: Orthopedics;  Laterality: Right;  Right Great Toe Amputation   AMPUTATION Right 09/18/2012   Procedure: Right Foot 2nd Ray Amputation;  Surgeon: Newt Minion, MD;  Location: [facility];  Service: Orthopedics;  Laterality: Right;  Right Foot 2nd Ray Amputation   AMPUTATION Right 10/14/2012   Procedure: AMPUTATION FOOT;  Surgeon: Newt Minion, MD;  Location: [facility];  Service: Orthopedics;  Laterality: Right;  Right Foot Transmetatarsal Amputation   AMPUTATION Left 09/23/2014   Procedure: Left Foot Transmetatarsal Amputation;  Surgeon: Newt Minion, MD;  Location: [facility];  Service: Orthopedics;  Laterality: Left;   AMPUTATION Left 11/22/2020   Procedure: LEFT BELOW  KNEE AMPUTATION;  Surgeon: Newt Minion, MD;  Location: [facility];  Service: Orthopedics;  Laterality: Left;   BACK SURGERY     lower   CARPAL TUNNEL RELEASE Right 01/30/2018   Procedure: RIGHT CARPAL TUNNEL RELEASE;  Surgeon: Newt Minion, MD;  Location: [facility];  Service: Orthopedics;  Laterality: Right;   CIRCUMCISION  2010   COLONOSCOPY     FOOT FRACTURE SURGERY Left 1970's   "broke it playing football" (10/12/2012)   HUMERUS FRACTURE SURGERY W/ IMPLANT Right 1960's   "put a plate in it" (0/45/4098)   I & D EXTREMITY Right 03/24/2020   Procedure: EXCISION RIGHT MEDIAL CUNEFORM;  Surgeon: Newt Minion, MD;  Location: [facility];  Service: Orthopedics;  Laterality: Right;   INCISION AND DRAINAGE OF WOUND Left 2006   "foot" (10/12/2012)   [place] SURGERY  2008   Social History   Occupational History   Not on file  Tobacco Use   Smoking status: Every Day    Types: Pipe   Smokeless tobacco: Never   Tobacco comments:    Uses 2-3 barrels a day.  Vaping Use   Vaping Use: Never used  Substance and Sexual Activity   Alcohol use: No    Alcohol/week: 0.0 standard drinks   Drug use: No   Sexual activity: Not Currently

## 2021-04-19 ENCOUNTER — Telehealth: Payer: Self-pay | Admitting: Orthopedic Surgery

## 2021-04-19 NOTE — Telephone Encounter (Signed)
Called number listed below for niece. No answer and no VM set up.

## 2021-04-19 NOTE — Telephone Encounter (Signed)
Patient's niece called and LM on PT VM for someone to call her. Did not give a reason why. Her call back number is [phone]

## 2021-04-23 NOTE — Telephone Encounter (Signed)
Called niece again, and rings a couple of times and then goes to VM, but that is not set up.

## 2021-04-30 DIAGNOSIS — C778 Secondary and unspecified malignant neoplasm of lymph nodes of multiple regions: Secondary | ICD-10-CM | POA: Diagnosis not present

## 2021-05-02 ENCOUNTER — Telehealth: Payer: Self-pay | Admitting: Orthopedic Surgery

## 2021-05-02 NOTE — Telephone Encounter (Signed)
Pt' Rita requesting a call back concerning pt eval for physical therapy. She states she didn't know about cancelled eval on 10/12. Please call [name] about this matter at [phone].

## 2021-05-03 NOTE — Telephone Encounter (Signed)
[name] was informed that pt has PT scheduled in our PT dept on 05/09/21 with Robin.

## 2021-05-03 NOTE — Telephone Encounter (Signed)
LMTCB on Rita's Id'd VM

## 2021-05-03 NOTE — Telephone Encounter (Signed)
Talk with patient [name] about physical therapy. Pt is scheduled.

## 2021-05-04 ENCOUNTER — Other Ambulatory Visit: Payer: Self-pay | Admitting: *Deleted

## 2021-05-04 NOTE — Patient Outreach (Signed)
[facility]) Care Management  05/04/2021  Elad Macphail 03-09-1941 [id number]   Outgoing call placed to member's niece [name], successful.  Denies any urgent concerns, encouraged to contact this care manager with questions.  Agrees to follow up within the next 2 weeks.  Will also place referral to [facility].  Care Plan : General Plan of Care (Adult)  Updates made by Valente David, RN since 05/04/2021 12:00 AM     Problem: Risk for hospital admission related to noncompliance and knowledge defecit regarding management of medical conditions   Priority: High     Long-Range Goal: Patient will show appropriate management of chronic medical conditions evidenced by no hospital admissions in the next 6 months   Start Date: 04/05/2021  Expected End Date: 10/02/2021  Priority: High  Note:   Current Barriers:  Knowledge Deficits related to plan of care for management of DMII and osteomyelitis with amputation Chronic Disease Management support and education needs related to DMII and osteomyelitis with amputation Non-adherence to prescribed medication regimen  RNCM Clinical Goal(s):  Patient will verbalize understanding of plan for management of DMII and BKA take all medications exactly as prescribed and will call provider for medication related questions attend all scheduled medical appointments: Dr. Sharol Given work with Home health PT to increase strength and learn mobility on new prosthetic not experience hospital admission. Hospital Admissions in last 6 months = 1  through collaboration with RN Care manager, provider, and care team.   Interventions: 1:1 collaboration with primary care provider regarding development and update of comprehensive plan of care as evidenced by provider attestation and co-signature Inter-disciplinary care team collaboration (see longitudinal plan of care) Evaluation of current treatment plan related to  self management and patient's adherence to plan as  established by provider  Amputation with new prosthesisGoal on track:  Yes Evaluation of current treatment plan related to DMII and BKA , ADL IADL limitations and Inability to perform ADL's independently self-management and patient's adherence to plan as established by provider. Discussed plans with patient for ongoing care management follow up and provided patient with direct contact information for care management team Reviewed medications with patient and discussed importance of taking in effort to decrease risk of admission; Collaborated with Dr. Sharol Given and [name] regarding HHPT; Reviewed scheduled/upcoming provider appointments including; Screening for signs and symptoms of depression related to chronic disease state;   Patient Goals/Self-Care Activities: Patient will self administer medications as prescribed Patient will attend all scheduled provider appointments Patient will continue to perform ADL's independently Patient will continue to perform IADL's independently  Follow Up Plan:  Telephone follow up appointment with care management team member scheduled for:  11/30 The patient has been provided with contact information for the care management team and has been advised to call with any health related questions or concerns.    Update 11/18 - Per niece, member still has not started PT for new prosthetic leg.  There was a request to have in home PT as member has not been able to get himself up, dressed, and out of the door to a transport vehicle independently, however family was notified that home PT is not suitable for prosthesis.  Member has appointment scheduled for next week to have evaluation for outpatient PT but there is still the barrier of member not having anyone in the home to help get him ready for visits.  He has continued to decline offers for ALF or adult day programs such as PACE, but per niece, he is  willing to have personal care aide come into the home.  She state he now  has Medicaid, agrees to have referral to [facility] for assistance.    She also report that member was seen by urology and told that there is concern for prostate cancer, will need a biopsy.  She has been trying to get appointment scheduled however she was notified that they no longer serve Medicaid patients.  She will contact a different urology office within the next week to schedule office visit and biopsy.       Valente David, [place], MSN [place] [phone]

## 2021-05-07 DIAGNOSIS — M436 Torticollis: Secondary | ICD-10-CM | POA: Diagnosis not present

## 2021-05-07 DIAGNOSIS — R2681 Unsteadiness on feet: Secondary | ICD-10-CM | POA: Diagnosis not present

## 2021-05-07 DIAGNOSIS — Z89619 Acquired absence of unspecified leg above knee: Secondary | ICD-10-CM | POA: Diagnosis not present

## 2021-05-09 ENCOUNTER — Ambulatory Visit (INDEPENDENT_AMBULATORY_CARE_PROVIDER_SITE_OTHER): Payer: Medicare Other | Admitting: Physical Therapy

## 2021-05-09 ENCOUNTER — Other Ambulatory Visit: Payer: Self-pay

## 2021-05-09 ENCOUNTER — Encounter: Payer: Self-pay | Admitting: Physical Therapy

## 2021-05-09 DIAGNOSIS — R2689 Other abnormalities of gait and mobility: Secondary | ICD-10-CM

## 2021-05-09 DIAGNOSIS — R293 Abnormal posture: Secondary | ICD-10-CM | POA: Diagnosis not present

## 2021-05-09 DIAGNOSIS — M25662 Stiffness of left knee, not elsewhere classified: Secondary | ICD-10-CM

## 2021-05-09 DIAGNOSIS — R2681 Unsteadiness on feet: Secondary | ICD-10-CM | POA: Diagnosis not present

## 2021-05-09 DIAGNOSIS — M6281 Muscle weakness (generalized): Secondary | ICD-10-CM

## 2021-05-09 NOTE — Therapy (Addendum)
[facility] Physical Therapy [address] [place], [place], [address] Phone: [phone]   Fax:  [phone]  Physical Therapy Evaluation  Patient Details  Name: [name] MRN: [id number] Date of Birth: 04-Jan-1941 Referring Provider (PT): Meridee Score, MD   Encounter Date: 05/09/2021    05/09/21 0931  PT Visits / Re-Eval  Visit Number 1  Number of Visits 25  Date for PT Re-Evaluation 08/02/21  Authorization  Authorization Type [employer] Medicare  PT Time Calculation  PT Start Time 0800  PT Stop Time 0844  PT Time Calculation (min) 44 min  PT - End of Session  Equipment Utilized During Treatment Gait belt  Activity Tolerance Patient tolerated treatment well;Patient limited by fatigue  Behavior During Therapy [facility] for tasks assessed/performed;Flat affect     Past Medical History:  Diagnosis Date   Arthritis    Denies   Glaucoma    High cholesterol    Hypertension    denies   Osteomyelitis ([place])    Right great toe   Peripheral vascular disease ([place])    diabetic  with osteomylitis   Pre-diabetes    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    Past Surgical History:  Procedure Laterality Date   AMPUTATION  07/23/2011   Procedure: AMPUTATION DIGIT;  Surgeon: Newt Minion, MD;  Location: [place];  Service: Orthopedics;  Laterality: Right;  Right Great Toe Amputation   AMPUTATION Right 09/18/2012   Procedure: Right Foot 2nd Ray Amputation;  Surgeon: Newt Minion, MD;  Location: [place];  Service: Orthopedics;  Laterality: Right;  Right Foot 2nd Ray Amputation   AMPUTATION Right 10/14/2012   Procedure: AMPUTATION FOOT;  Surgeon: Newt Minion, MD;  Location: [place];  Service: Orthopedics;  Laterality: Right;  Right Foot Transmetatarsal Amputation   AMPUTATION Left 09/23/2014   Procedure: Left Foot Transmetatarsal Amputation;  Surgeon: Newt Minion, MD;  Location: [place];  Service: Orthopedics;  Laterality: Left;   AMPUTATION Left 11/22/2020   Procedure: LEFT BELOW KNEE  AMPUTATION;  Surgeon: Newt Minion, MD;  Location: [place];  Service: Orthopedics;  Laterality: Left;   BACK SURGERY     lower   CARPAL TUNNEL RELEASE Right 01/30/2018   Procedure: RIGHT CARPAL TUNNEL RELEASE;  Surgeon: Newt Minion, MD;  Location: [place];  Service: Orthopedics;  Laterality: Right;   CIRCUMCISION  2010   COLONOSCOPY     FOOT FRACTURE SURGERY Left 1970's   "broke it playing football" (10/12/2012)   HUMERUS FRACTURE SURGERY W/ IMPLANT Right 1960's   "put a plate in it" (9/52/8413)   I & D EXTREMITY Right 03/24/2020   Procedure: EXCISION RIGHT MEDIAL CUNEFORM;  Surgeon: Newt Minion, MD;  Location: [place];  Service: Orthopedics;  Laterality: Right;   INCISION AND DRAINAGE OF WOUND Left 2006   "foot" (10/12/2012)   [place] SURGERY  2008    There were no vitals filed for this visit.    Subjective Assessment - 05/09/21 0806     Subjective This 80yo male was referred to PT by Meridee Score, MD with [phone] (ICD-10-CM) - S/P BKA (below knee amputation) unilateral, left.  He underwent a left Transtibial Amputation on 11/22/2020 due to abcess of left ankle bursa with osteomyelitis. He recieved early October.    Patient is accompained by: Family member   niece   Pertinent History arthritis, glaucoma, HTN, osteomyelitis, PVD, DM2, CVA cerebellum, right toe amputations, NSTEMI, gout    Patient Stated Goals use prosthesis to go into  community including church & fishing.    Currently in Pain? No/denies                [facility] PT Assessment - 05/09/21 0801       Assessment   Medical Diagnosis Z89.512 (ICD-10-CM) - S/P BKA (below knee amputation) unilateral, left    Referring Provider (PT) Meridee Score, MD    Onset Date/Surgical Date 03/23/21   PT referral for prsothetic training, prosthesis delivery 03/08/2021   Hand Dominance Right    Prior Therapy none for prosthetic      Precautions   Precautions Fall      Balance Screen   Has the patient fallen in the past 6 months No     Has the patient had a decrease in activity level because of a fear of falling?  No    Is the patient reluctant to leave their home because of a fear of falling?  No      Home Environment   Living Environment Private residence    Living Arrangements Alone    Available Help at Discharge Family    Type of [place] entrance   curb & small ramp and 30-40'   [place] One level    [place] - 2 wheels;Cane - single point;Shower seat;Wheelchair - manual;Hand held shower head      Prior Function   Level of Independence Independent;Independent with household mobility with device;Independent with community mobility with device   used RW   Vocation Retired    Leisure Theatre manager   Posture/Postural Control Postural limitations    Postural Limitations Rounded Shoulders;Forward head;Flexed trunk;Weight shift right      ROM / Strength   AROM / PROM / Strength AROM;PROM;Strength      AROM   AROM Assessment Site Knee    Right Knee Extension -30   standing with RW & TTA prosthesis     PROM   Overall PROM  Deficits    Overall PROM Comments right knee ext -35* seated (may be related to hamstring tightness),  right ankle DF -10*    Right/Left Knee Left    Left Knee Extension -18   seated     Strength   Overall Strength Deficits    Overall Strength Comments gross MMT seated & functional strength with task.  Bil hip muscles grossly 3/5-4/5.    Strength Assessment Site Knee;Ankle    Right Knee Flexion 3-/5    Right Knee Extension 3-/5    Left Knee Flexion 3-/5    Left Knee Extension 3-/5    Right Ankle Dorsiflexion 3-/5      Transfers   Transfers Sit to Stand;Stand to Sit    Sit to Stand 4: Min assist;With upper extremity assist;With armrests;From chair/3-in-1;Other (comment);From elevated surface   to RW, MinA for balance more than strength   Stand to Sit 5: Supervision;With upper extremity assist;With armrests;To elevated  surface;To chair/3-in-1;Other (comment);Uncontrolled descent   from RW     Ambulation/Gait   Ambulation/Gait Yes    Ambulation/Gait Assistance 4: Min assist    Ambulation/Gait Assistance Details excessive UE weight bearing on RW and partial weight on prosthesis    Ambulation Distance (Feet) 40 Feet    Assistive device Rolling walker;Prosthesis   TTA prosthesis   Gait Pattern Step-to pattern;Decreased step length - right;Decreased stance time - left;Decreased hip/knee flexion - left;Decreased dorsiflexion - right;Decreased weight shift to left;Left circumduction;Left  hip hike;Right flexed knee in stance;Left flexed knee in stance;Antalgic;Trunk flexed;Abducted - left;Poor foot clearance - right    Ambulation Surface Level;Indoor      Standardized Balance Assessment   Standardized Balance Assessment Berg Balance Test      Berg Balance Test   Sit to Stand Needs moderate or maximal assist to stand   Berg Task with RW support = 1   Standing Unsupported Unable to stand 30 seconds unassisted   Berg Task with RW support = 1   Sitting with Back Unsupported but Feet Supported on Floor or Stool Able to sit safely and securely 2 minutes    Stand to Sit Needs assistance to sit   Berg Task with RW support = 1   Transfers Able to transfer safely, definite need of hands    Standing Unsupported with Eyes Closed Needs help to keep from falling   Berg Task with RW support = 1   Standing Unsupported with Feet Together Needs help to attain position and unable to hold for 15 seconds   Berg Task with RW support = 0   From Standing, Reach Forward with Outstretched Arm Loses balance while trying/requires external support   Berg Task with RW support = 1   From Standing Position, Pick up Object from Floor Unable to try/needs assist to keep balance   Berg Task with RW support = 1   From Standing Position, Turn to Look Behind Over each Shoulder Needs assist to keep from losing balance and falling   Berg Task with RW  support = 1   Turn 360 Degrees Needs assistance while turning   Berg Task with RW support = 0   Standing Unsupported, Alternately Place Feet on Step/Stool Needs assistance to keep from falling or unable to try   Berg Task with RW support = 0   Standing Unsupported, One Foot in [employer] balance while stepping or standing   Berg Task with RW support = 1   Standing on One Leg Unable to try or needs assist to prevent fall   Berg Task with RW support = 1   Total Score 7    Merrilee Jansky comment: Berg Task with RW support = 16/56             Prosthetics Assessment - 05/09/21 0800       Prosthetics   Prosthetic Care Dependent with Skin check;Residual limb care;Care of non-amputated limb;Prosthetic cleaning;Ply sock cleaning;Correct ply sock adjustment;Proper wear schedule/adjustment;Proper weight-bearing schedule/adjustment    Donning prosthesis  Max assist    Doffing prosthesis  Min assist    Current prosthetic wear tolerance (days/week)  none in last month    Current prosthetic wear tolerance (#hours/day)  none in last month    Current prosthetic weight-bearing tolerance (hours/day)  Patient tolerated standing with partial weight on prosthesis with RW support for 5 minutes without limb pain or discomfort.    Edema non-pitting    Residual limb condition  no open wounds, dry skin, gross sensation intact, normal temperature, no hair growth, cylinderical shape    Prosthesis Description Total contact socket with flexible inner liner, silicon sleeve interface, suction sleeve with poor skin contact proximally, K2 flexible keel foot                       Objective measurements completed on examination: See above findings.       [facility] Adult PT Treatment/Exercise - 05/09/21 0800       Prosthetics  Prosthetic Care Comments  PT recommended wear 2hrs 2x/day daily. Only stand or walk with RW & family assist.  PT instructed with demo & verbal cues positioning prosthesis seated.  PT demo &  verbal cues while neice video'd on phone how to donne.    Education Provided Skin check;Residual limb care;Prosthetic cleaning;Correct ply sock adjustment;Proper Donning;Proper wear schedule/adjustment;Other (comment)   see prosthetic care comments   Person(s) Educated Patient;Other (comment)   neice   Education Method Explanation;Demonstration;Tactile cues;Verbal cues    Education Method Verbalized understanding;Tactile cues required;Verbal cues required;Needs further instruction                       PT Short Term Goals - 05/09/21 1130       PT SHORT TERM GOAL #1   Title Patient donnes prosthesis with cues only / no physical assist.    Time 4    Period Weeks    Status New    Target Date 06/07/21      PT SHORT TERM GOAL #2   Title Patient tolerates prosthesis >8 hrs total /day without skin issues or limb pain    Time 4    Period Weeks    Status New    Target Date 06/07/21      PT SHORT TERM GOAL #3   Title Patient able to transfer sit/stand std chair height with armrests & reach 10" with RW support safely.    Time 4    Period Weeks    Status New    Target Date 06/07/21      PT SHORT TERM GOAL #4   Title Patient ambulates 100' with RW & prosthesis with supervision.    Time 4    Period Weeks    Status New    Target Date 06/07/21      PT SHORT TERM GOAL #5   Title .Patient negotiates ramps & curbs with RW & prosthesis with modA.    Time 4    Period Weeks    Status New    Target Date 06/07/21               PT Long Term Goals - 05/09/21 1127       PT LONG TERM GOAL #1   Title Patient & family are independent with prosthetic care and pt able to donne prosthesis modified independent.    Time 12    Period Weeks    Status New    Target Date 08/02/21      PT LONG TERM GOAL #2   Title Patient tolerates prosthesis wear >90% of awake hours without skin or limb pain issues.    Time 12    Period Weeks    Status New    Target Date 08/02/21      PT  LONG TERM GOAL #3   Title Berg Balance >/= 36/56 with RW support to indicate lower fall risk    Time 12    Period Weeks    Status New    Target Date 08/02/21      PT LONG TERM GOAL #4   Title Patient ambulates 300' with RW & prosthesis modified independent.    Time 12    Period Weeks    Status New    Target Date 08/02/21      PT LONG TERM GOAL #5   Title Patient negotiates ramps, curbs with RW & stairs with single rail & prosthesis modified independent.    Time 12  Period Weeks    Status New    Target Date 08/02/21                    Plan - 05/09/21 1117     Clinical Impression Statement This 80yo male underwent a left Transtibial Amputation on 11/22/2020 and received his first prosthesis on 03/08/2021. He has not had any therapy or training with prosthesis.  He lives alone with neice assisting intermittently during the day.  Cognitively he appears impaired but functional.  He is dependent in proper prosthetic care which increases risk of skin issues and needs physical assist to donne.  Patient is not wearing prosthesis and requires instruction to safely progress wear which limits function during his day & increases fall risk when prosthesis off. Berg Balance test score 7/56 without support and 16/56 with RW support indicates high fall risk and limited safety / ability for standing ADLs.  His prosthetic gait requires assist and RW use with deviations including excessive weight bearing on RW which indicates high fall risk. Patient would benefit from skilled PT to improve function & safety with his prosthesis.    Personal Factors and Comorbidities Comorbidity 3+;Past/Current Experience;Other   lives alone   Comorbidities arthritis, glaucoma, HTN, osteomyelitis, PVD, DM2, CVA cerebellum, right transmetatarsal amputation, NSTEMI, gout    Examination-Activity Limitations Locomotion Level;Reach Overhead;Stairs;Stand;Toileting;Transfers    Examination-Participation Restrictions  Community Activity    Stability/Clinical Decision Making Evolving/Moderate complexity    Clinical Decision Making Moderate    Rehab Potential Good    PT Frequency 2x / week    PT Duration 12 weeks    PT Treatment/Interventions ADLs/Self Care Home Management;DME Instruction;Gait training;Stair training;Functional mobility training;Therapeutic activities;Therapeutic exercise;Balance training;Neuromuscular re-education;Patient/family education;Prosthetic Training;Manual techniques;Passive range of motion;Vestibular    PT Next Visit Plan review prosthetic care, prosthetic gait with RW, standing balance activities    Consulted and Agree with Plan of Care Patient;Family member/caregiver    Family Member Consulted neice, Arlana Lindau             Patient will benefit from skilled therapeutic intervention in order to improve the following deficits and impairments:  Abnormal gait, Decreased activity tolerance, Decreased balance, Decreased cognition, Decreased endurance, Decreased knowledge of use of DME, Decreased mobility, Decreased range of motion, Decreased strength, Impaired flexibility, Postural dysfunction, Prosthetic Dependency  Visit Diagnosis: Unsteadiness on feet  Other abnormalities of gait and mobility  Muscle weakness (generalized)  Stiffness of left knee, not elsewhere classified  Abnormal posture     Problem List Patient Active Problem List   Diagnosis Date Noted   Abscess of bursa of left ankle 11/22/2020   Subacute osteomyelitis, left ankle and foot ([place])    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 ([place]) 04/12/2020   Acute encephalopathy 03/20/2020   SIRS (systemic inflammatory response syndrome) ([place]) 03/20/2020   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[place]) 03/20/2020   ARF (acute renal failure) ([place]) 03/20/2020   Midfoot ulcer, left, limited to breakdown of skin ([place]) 12/29/2018   Occipital stroke ([place]) 10/31/2018   DM2 (diabetes mellitus, type 2) ([place])  10/31/2018   NSTEMI (non-ST elevated myocardial infarction) ([place]) 05/28/2018   Syncope 05/28/2018   HTN (hypertension) 05/28/2018   Hyperlipemia, mixed 05/28/2018   Rash and nonspecific skin eruption 12/16/2017   Pain in right hand 09/11/2017   Carpal tunnel syndrome, right upper limb 07/01/2017   Arthritis of carpometacarpal ([place]) joint of left thumb 12/30/2016   Midfoot skin ulcer, right, limited to breakdown of skin (  [place]) 07/22/2016   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[place]) 09/23/2014   Osteomyelitis of ankle or foot, left, acute ([place]) 08/23/2014   Cellulitis 10/12/2012   Chest pain 10/12/2012   Tobacco abuse 10/12/2012   ABSCESS, AXILLA, LEFT 02/23/2008   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   CHEST WALL PAIN, ACUTE 12/03/2007   CANDIDIASIS, GLANS PENIS 09/16/2007   HEADACHE 08/04/2007   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 [place], [place], [address] Phone: [phone]   Fax:  [phone]  Name: [name] MRN: [id number] Date of Birth: 26-Mar-1941

## 2021-05-15 ENCOUNTER — Encounter: Payer: Self-pay | Admitting: Physical Therapy

## 2021-05-15 ENCOUNTER — Ambulatory Visit (INDEPENDENT_AMBULATORY_CARE_PROVIDER_SITE_OTHER): Payer: Medicare Other | Admitting: Physical Therapy

## 2021-05-15 ENCOUNTER — Other Ambulatory Visit: Payer: Self-pay

## 2021-05-15 DIAGNOSIS — M6281 Muscle weakness (generalized): Secondary | ICD-10-CM | POA: Diagnosis not present

## 2021-05-15 DIAGNOSIS — R293 Abnormal posture: Secondary | ICD-10-CM | POA: Diagnosis not present

## 2021-05-15 DIAGNOSIS — R2681 Unsteadiness on feet: Secondary | ICD-10-CM | POA: Diagnosis not present

## 2021-05-15 DIAGNOSIS — R2689 Other abnormalities of gait and mobility: Secondary | ICD-10-CM | POA: Diagnosis not present

## 2021-05-15 DIAGNOSIS — M25662 Stiffness of left knee, not elsewhere classified: Secondary | ICD-10-CM

## 2021-05-15 NOTE — Therapy (Signed)
[facility] [address] [place], [place], [address] Phone: [phone]   Fax:  [phone]  Physical Therapy Treatment  Patient Details  Name: [name] MRN: [id number] Date of Birth: 03-Dec-1940 Referring Provider (PT): Meridee Score, MD   Encounter Date: 05/15/2021   PT End of Session - 05/15/21 0806     Visit Number 2    Number of Visits 25    Date for PT Re-Evaluation 08/02/21    Authorization Type UHC Medicare    PT Start Time 0800    PT Stop Time 0844    PT Time Calculation (min) 44 min    Equipment Utilized During Treatment Gait belt    Activity Tolerance Patient tolerated treatment well;Patient limited by fatigue    Behavior During Therapy [facility] for tasks assessed/performed             Past Medical History:  Diagnosis Date   Arthritis    Denies   Glaucoma    High cholesterol    Hypertension    denies   Osteomyelitis ([place])    Right great toe   Peripheral vascular disease ([place])    diabetic  with osteomylitis   Pre-diabetes    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    Past Surgical History:  Procedure Laterality Date   AMPUTATION  07/23/2011   Procedure: AMPUTATION DIGIT;  Surgeon: Newt Minion, MD;  Location: [place];  Service: Orthopedics;  Laterality: Right;  Right Great Toe Amputation   AMPUTATION Right 09/18/2012   Procedure: Right Foot 2nd Ray Amputation;  Surgeon: Newt Minion, MD;  Location: [place];  Service: Orthopedics;  Laterality: Right;  Right Foot 2nd Ray Amputation   AMPUTATION Right 10/14/2012   Procedure: AMPUTATION FOOT;  Surgeon: Newt Minion, MD;  Location: [place];  Service: Orthopedics;  Laterality: Right;  Right Foot Transmetatarsal Amputation   AMPUTATION Left 09/23/2014   Procedure: Left Foot Transmetatarsal Amputation;  Surgeon: Newt Minion, MD;  Location: [place];  Service: Orthopedics;  Laterality: Left;   AMPUTATION Left 11/22/2020   Procedure: LEFT BELOW KNEE AMPUTATION;  Surgeon: Newt Minion, MD;   Location: [place];  Service: Orthopedics;  Laterality: Left;   BACK SURGERY     lower   CARPAL TUNNEL RELEASE Right 01/30/2018   Procedure: RIGHT CARPAL TUNNEL RELEASE;  Surgeon: Newt Minion, MD;  Location: [place];  Service: Orthopedics;  Laterality: Right;   CIRCUMCISION  2010   COLONOSCOPY     FOOT FRACTURE SURGERY Left 1970's   "broke it playing football" (10/12/2012)   HUMERUS FRACTURE SURGERY W/ IMPLANT Right 1960's   "put a plate in it" (6/64/4034)   I & D EXTREMITY Right 03/24/2020   Procedure: EXCISION RIGHT MEDIAL CUNEFORM;  Surgeon: Newt Minion, MD;  Location: [place];  Service: Orthopedics;  Laterality: Right;   INCISION AND DRAINAGE OF WOUND Left 2006   "foot" (10/12/2012)   [place] SURGERY  2008    There were no vitals filed for this visit.   Subjective Assessment - 05/15/21 0800     Subjective Patient reports daily wear 2hrs 2x/day. He lives alone & niece reports prosthesis appeared in same location.    Patient is accompained by: Family member   niece   Pertinent History arthritis, glaucoma, HTN, osteomyelitis, PVD, DM2, CVA cerebellum, right toe amputations, NSTEMI, gout    Patient Stated Goals use prosthesis to go into community including church & fishing.    Currently in Pain? No/denies  [place] Adult PT Treatment/Exercise - 05/15/21 0800       Transfers   Transfers Sit to Stand;Stand to Sit    Sit to Stand 3: Mod assist;From elevated surface;With upper extremity assist;With armrests;From chair/3-in-1;Other (comment)   to RW   Sit to Stand Details Verbal cues for technique;Tactile cues for weight shifting;Verbal cues for sequencing;Manual facilitation for weight shifting    Stand to Sit 4: Min assist;With upper extremity assist;With armrests;To chair/3-in-1;Other (comment)   from RW   Stand to Sit Details (indicate cue type and reason) Verbal cues for sequencing;Verbal cues for technique;Manual facilitation for  weight shifting      Ambulation/Gait   Ambulation/Gait Yes    Ambulation/Gait Assistance 3: Mod assist    Ambulation/Gait Assistance Details manual assist for RW position to body, wt shift over prosthesis in stance & verbal cues for upright posture    Ambulation Distance (Feet) 40 Feet   20' & 40'   Assistive device Rolling walker;Prosthesis    Ambulation Surface Level;Indoor      Neuro Re-ed    Neuro Re-ed Details  Standing with posterior pelvis to counter & BUE RW support- tactile & verbal cues for upright trunk - stood for 90sec before noted muscle fatigue.      Exercises   Exercises Knee/Hip;Other Exercises      Knee/Hip Exercises: Aerobic   Nustep seat 13 level 5 with BLEs & BUEs for 8 min      Prosthetics   Prosthetic Care Comments  PT repeatedly 5x tried to donne liner by sliding (not rolling) onto limb with cloth side to skin (gel side should be to skin).  PT corrected with verbal & demo cues ea time.  With PT finally donning liner for him.  PT made niece aware that prosthesis will slide off his limb if attempts to ambulate with prosthesis donned like this.    Current prosthetic wear tolerance (days/week)  daily over last 6 days    Current prosthetic wear tolerance (#hours/day)  reports 2hrs 2x/day    Education Provided Skin check;Residual limb care;Proper Donning;Proper wear schedule/adjustment;Other (comment)    Person(s) Educated Patient;Caregiver(s)    Education Method Explanation;Demonstration;Tactile cues;Verbal cues    Education Method Verbalized understanding;Tactile cues required;Verbal cues required;Needs further instruction    Donning Prosthesis Moderate assist                       PT Short Term Goals - 05/09/21 1130       PT SHORT TERM GOAL #1   Title Patient donnes prosthesis with cues only / no physical assist.    Time 4    Period Weeks    Status New    Target Date 06/07/21      PT SHORT TERM GOAL #2   Title Patient tolerates prosthesis >8  hrs total /day without skin issues or limb pain    Time 4    Period Weeks    Status New    Target Date 06/07/21      PT SHORT TERM GOAL #3   Title Patient able to transfer sit/stand std chair height with armrests & reach 10" with RW support safely.    Time 4    Period Weeks    Status New    Target Date 06/07/21      PT SHORT TERM GOAL #4   Title Patient ambulates 100' with RW & prosthesis with supervision.    Time 4    Period Weeks  Status New    Target Date 06/07/21      PT SHORT TERM GOAL #5   Title .Patient negotiates ramps & curbs with RW & prosthesis with modA.    Time 4    Period Weeks    Status New    Target Date 06/07/21               PT Long Term Goals - 05/09/21 1127       PT LONG TERM GOAL #1   Title Patient & family are independent with prosthetic care and pt able to donne prosthesis modified independent.    Time 12    Period Weeks    Status New    Target Date 08/02/21      PT LONG TERM GOAL #2   Title Patient tolerates prosthesis wear >90% of awake hours without skin or limb pain issues.    Time 12    Period Weeks    Status New    Target Date 08/02/21      PT LONG TERM GOAL #3   Title Berg Balance >/= 36/56 with RW support to indicate lower fall risk    Time 12    Period Weeks    Status New    Target Date 08/02/21      PT LONG TERM GOAL #4   Title Patient ambulates 300' with RW & prosthesis modified independent.    Time 12    Period Weeks    Status New    Target Date 08/02/21      PT LONG TERM GOAL #5   Title Patient negotiates ramps, curbs with RW & stairs with single rail & prosthesis modified independent.    Time 12    Period Weeks    Status New    Target Date 08/02/21                   Plan - 05/15/21 0807     Clinical Impression Statement Patient needs assist and constant cues to properly donne prosthesis.  He improved sit to/from stand with instruction but needs further work to enable function.  PT would benefit  from further skilled PT.    Personal Factors and Comorbidities Comorbidity 3+;Past/Current Experience;Other   lives alone   Comorbidities arthritis, glaucoma, HTN, osteomyelitis, PVD, DM2, CVA cerebellum, right transmetatarsal amputation, NSTEMI, gout    Examination-Activity Limitations Locomotion Level;Reach Overhead;Stairs;Stand;Toileting;Transfers    Examination-Participation Restrictions Community Activity    Stability/Clinical Decision Making Evolving/Moderate complexity    Rehab Potential Good    PT Frequency 2x / week    PT Duration 12 weeks    PT Treatment/Interventions ADLs/Self Care Home Management;DME Instruction;Gait training;Stair training;Functional mobility training;Therapeutic activities;Therapeutic exercise;Balance training;Neuromuscular re-education;Patient/family education;Prosthetic Training;Manual techniques;Passive range of motion;Vestibular    PT Next Visit Plan review prosthetic care, prosthetic gait with RW, standing balance activities    Consulted and Agree with Plan of Care Patient;Family member/caregiver    Family Member Consulted neice, Arlana Lindau             Patient will benefit from skilled therapeutic intervention in order to improve the following deficits and impairments:  Abnormal gait, Decreased activity tolerance, Decreased balance, Decreased cognition, Decreased endurance, Decreased knowledge of use of DME, Decreased mobility, Decreased range of motion, Decreased strength, Impaired flexibility, Postural dysfunction, Prosthetic Dependency  Visit Diagnosis: Unsteadiness on feet  Other abnormalities of gait and mobility  Muscle weakness (generalized)  Stiffness of left knee, not elsewhere classified  Abnormal posture  Problem List Patient Active Problem List   Diagnosis Date Noted   Abscess of bursa of left ankle 11/22/2020   Subacute osteomyelitis, left ankle and foot ([place])    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 ([place])  04/12/2020   Acute encephalopathy 03/20/2020   SIRS (systemic inflammatory response syndrome) ([place]) 03/20/2020   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[place]) 03/20/2020   ARF (acute renal failure) ([place]) 03/20/2020   Midfoot ulcer, left, limited to breakdown of skin ([place]) 12/29/2018   Occipital stroke ([place]) 10/31/2018   DM2 (diabetes mellitus, type 2) ([place]) 10/31/2018   NSTEMI (non-ST elevated myocardial infarction) ([place]) 05/28/2018   Syncope 05/28/2018   HTN (hypertension) 05/28/2018   Hyperlipemia, mixed 05/28/2018   Rash and nonspecific skin eruption 12/16/2017   Pain in right hand 09/11/2017   Carpal tunnel syndrome, right upper limb 07/01/2017   Arthritis of carpometacarpal [facility]) joint of left thumb 12/30/2016   Midfoot skin ulcer, right, limited to breakdown of skin ([place]) 07/22/2016   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[place]) 09/23/2014   Osteomyelitis of ankle or foot, left, acute ([place]) 08/23/2014   Cellulitis 10/12/2012   Chest pain 10/12/2012   Tobacco abuse 10/12/2012   ABSCESS, AXILLA, LEFT 02/23/2008   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   CHEST WALL PAIN, ACUTE 12/03/2007   CANDIDIASIS, GLANS PENIS 09/16/2007   HEADACHE 08/04/2007   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

## 2021-05-16 ENCOUNTER — Other Ambulatory Visit: Payer: Self-pay | Admitting: *Deleted

## 2021-05-16 NOTE — Patient Outreach (Signed)
[name]) Care Management  05/16/2021  [name] 12/19/1940 [id number]   Outgoing call placed to niece [name], successful.  Denies any urgent concerns, encouraged to contact this care manager with questions.  Agrees to follow up within the next week to follow up on biopsy.  Care Plan : General Plan of Care (Adult)  Updates made by Valente David, RN since 05/16/2021 12:00 AM     Problem: Risk for hospital admission related to noncompliance and knowledge defecit regarding management of medical conditions   Priority: High     Long-Range Goal: Patient will show appropriate management of chronic medical conditions evidenced by no hospital admissions in the next 6 months   Start Date: 04/05/2021  Expected End Date: 10/02/2021  This Visit's Progress: On track  Priority: High  Note:   Current Barriers:  Knowledge Deficits related to plan of care for management of DMII and osteomyelitis with amputation Chronic Disease Management support and education needs related to DMII and osteomyelitis with amputation Non-adherence to prescribed medication regimen  RNCM Clinical Goal(s):  Patient will verbalize understanding of plan for management of DMII and BKA take all medications exactly as prescribed and will call provider for medication related questions attend all scheduled medical appointments: Dr. Sharol Given work with Home health PT to increase strength and learn mobility on new prosthetic not experience hospital admission. Hospital Admissions in last 6 months = 1  through collaboration with RN Care manager, provider, and care team.   Interventions: 1:1 collaboration with primary care provider regarding development and update of comprehensive plan of care as evidenced by provider attestation and co-signature Inter-disciplinary care team collaboration (see longitudinal plan of care) Evaluation of current treatment plan related to  self management and patient's adherence to plan  as established by provider  Amputation with new prosthesisGoal on track:  Yes Evaluation of current treatment plan related to DMII and BKA , ADL IADL limitations and Inability to perform ADL's independently self-management and patient's adherence to plan as established by provider. Discussed plans with patient for ongoing care management follow up and provided patient with direct contact information for care management team Reviewed medications with patient and discussed importance of taking in effort to decrease risk of admission; Collaborated with Dr. Sharol Given and [name] regarding HHPT; Reviewed scheduled/upcoming provider appointments including; Screening for signs and symptoms of depression related to chronic disease state;   Patient Goals/Self-Care Activities: Patient will self administer medications as prescribed Patient will attend all scheduled provider appointments Patient will continue to perform ADL's independently Patient will continue to perform IADL's independently  Follow Up Plan:  Telephone follow up appointment with care management team member scheduled for:  11/30 The patient has been provided with contact information for the care management team and has been advised to call with any health related questions or concerns.    Update 11/18 - Per niece, member still has not started PT for new prosthetic leg.  There was a request to have in home PT as member has not been able to get himself up, dressed, and out of the door to a transport vehicle independently, however family was notified that home PT is not suitable for prosthesis.  Member has appointment scheduled for next week to have evaluation for outpatient PT but there is still the barrier of member not having anyone in the home to help get him ready for visits.  He has continued to decline offers for ALF or adult day programs such as [facility], but per niece,  he is willing to have personal care aide come into the home.  She state he  now has Medicaid, agrees to have referral to CSW for assistance.    She also report that member was seen by urology and told that there is concern for prostate cancer, will need a biopsy.  She has been trying to get appointment scheduled however she was notified that they no longer serve Medicaid patients.  She will contact a different urology office within the next week to schedule office visit and biopsy.     Update 11/30 - Niece report member has been able to start outpatient PT sessions, she is still working with him on compliance issues.  Advised that CSW would be reaching out to her to discuss process for PCS.  Notified that member does not have appointment with urology office, will have appointment and biopsy on Monday 12/5.    Valente David, [place], MSN [place] [phone]

## 2021-05-17 ENCOUNTER — Encounter: Payer: Self-pay | Admitting: Physical Therapy

## 2021-05-17 ENCOUNTER — Other Ambulatory Visit: Payer: Self-pay

## 2021-05-17 ENCOUNTER — Ambulatory Visit (INDEPENDENT_AMBULATORY_CARE_PROVIDER_SITE_OTHER): Payer: Medicare Other | Admitting: Physical Therapy

## 2021-05-17 DIAGNOSIS — R2681 Unsteadiness on feet: Secondary | ICD-10-CM

## 2021-05-17 DIAGNOSIS — R2689 Other abnormalities of gait and mobility: Secondary | ICD-10-CM | POA: Diagnosis not present

## 2021-05-17 DIAGNOSIS — M25662 Stiffness of left knee, not elsewhere classified: Secondary | ICD-10-CM

## 2021-05-17 DIAGNOSIS — M6281 Muscle weakness (generalized): Secondary | ICD-10-CM | POA: Diagnosis not present

## 2021-05-17 DIAGNOSIS — R293 Abnormal posture: Secondary | ICD-10-CM

## 2021-05-17 NOTE — Therapy (Signed)
[facility] [address] [place], [place], [address] Phone: [phone]   Fax:  [phone]  Physical Therapy Treatment  Patient Details  Name: [name] MRN: [id number] Date of Birth: 19-Jun-1940 Referring Provider (PT): Meridee Score, MD   Encounter Date: 05/17/2021   PT End of Session - 05/17/21 0805     Visit Number 3    Number of Visits 25    Date for PT Re-Evaluation 08/02/21    Authorization Type [employer] Medicare    PT Start Time 0800    PT Stop Time 0845    PT Time Calculation (min) 45 min    Equipment Utilized During Treatment Gait belt    Activity Tolerance Patient tolerated treatment well;Patient limited by fatigue    Behavior During Therapy [facility] for tasks assessed/performed             Past Medical History:  Diagnosis Date   Arthritis    Denies   Glaucoma    High cholesterol    Hypertension    denies   Osteomyelitis ([place])    Right great toe   Peripheral vascular disease (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;  Service: Orthopedics;  Laterality: Right;  Right Foot 2nd Ray Amputation   AMPUTATION Right 10/14/2012   Procedure: AMPUTATION FOOT;  Surgeon: Newt Minion, MD;  Location: [place];  Service: Orthopedics;  Laterality: Right;  Right Foot Transmetatarsal Amputation   AMPUTATION Left 09/23/2014   Procedure: Left Foot Transmetatarsal Amputation;  Surgeon: Newt Minion, MD;  Location: [place];  Service: Orthopedics;  Laterality: Left;   AMPUTATION Left 11/22/2020   Procedure: LEFT BELOW KNEE AMPUTATION;  Surgeon: Newt Minion, MD;   Location: [place];  Service: Orthopedics;  Laterality: Left;   BACK SURGERY     lower   CARPAL TUNNEL RELEASE Right 01/30/2018   Procedure: RIGHT CARPAL TUNNEL RELEASE;  Surgeon: Newt Minion, MD;  Location: [place];  Service: Orthopedics;  Laterality: Right;   CIRCUMCISION  2010   COLONOSCOPY     FOOT FRACTURE SURGERY Left 1970's   "broke it playing football" (10/12/2012)   HUMERUS FRACTURE SURGERY W/ IMPLANT Right 1960's   "put a plate in it" (8/82/8003)   I & D EXTREMITY Right 03/24/2020   Procedure: EXCISION RIGHT MEDIAL CUNEFORM;  Surgeon: Newt Minion, MD;  Location: [place];  Service: Orthopedics;  Laterality: Right;   INCISION AND DRAINAGE OF WOUND Left 2006   "foot" (10/12/2012)   [place] SURGERY  2008    There were no vitals filed for this visit.   Subjective Assessment - 05/17/21 0800     Subjective He wore prosthesis. Neice reports still needs assist for donning.    Patient is accompained by: Family member   niece   Pertinent History arthritis, glaucoma, HTN, osteomyelitis, PVD, DM2, CVA cerebellum, right toe amputations, NSTEMI, gout    Patient Stated Goals use prosthesis to go into community including church & fishing.    Currently in Pain? No/denies  [place] Adult PT Treatment/Exercise - 05/17/21 0800       Transfers   Transfers Sit to Stand;Stand to Sit    Sit to Stand 3: Mod assist;From elevated surface;With upper extremity assist;With armrests;From chair/3-in-1;Other (comment)   to RW   Sit to Stand Details Verbal cues for technique;Tactile cues for weight shifting;Verbal cues for sequencing;Manual facilitation for weight shifting    Stand to Sit 4: Min assist;With upper extremity assist;With armrests;To chair/3-in-1;Other (comment)   from RW   Stand to Sit Details (indicate cue type and reason) Verbal cues for sequencing;Verbal cues for technique;Manual facilitation for weight shifting      Ambulation/Gait    Ambulation/Gait Yes    Ambulation/Gait Assistance 3: Mod assist    Ambulation/Gait Assistance Details PT educated Niece on assisting gait with recommendation to walk between w/c & chair with armrests short distances in apt.  She return demo while PT supervised. She appears safe short distance assistance. Manual & verbal cues on upright posture, physical assist to maintain RW appropriate distance.    Ambulation Distance (Feet) 120 Feet   120' with PT, 41' X 2 with niece with PT supervising.   Assistive device Rolling walker;Prosthesis    Ambulation Surface Level;Indoor      Neuro Re-ed    Neuro Re-ed Details  --      Exercises   Exercises Knee/Hip;Other Exercises      Knee/Hip Exercises: Aerobic   Nustep seat 13 level 5 with BLEs & BUEs for 8 min LLE needs strap to maintain on pedal PT able to palpate muscle activity      Prosthetics   Prosthetic Care Comments  Pt donned liner with niece assist prior to PT today.  Anti-stretch strip of liner was over tibia where it should not be located.  PT instructed pt & niece in proper donning of liner and he donned liner with ModA with PT cueing him to palpate liner due to visual issues.  He donnes prosthetic sock with verbal cues only and prosthesis with minA & constant total cueing.    Current prosthetic wear tolerance (days/week)  daily per pt report but he lives alone & needs assist / cueing to donne correctly    Current prosthetic wear tolerance (#hours/day)  reports 2hrs 2x/day    Education Provided Skin check;Residual limb care;Proper Donning;Proper wear schedule/adjustment;Other (comment)   see prosthetic care comments   Person(s) Educated Patient;Caregiver(s)    Education Method Explanation;Demonstration;Tactile cues;Verbal cues    Education Method Verbalized understanding;Tactile cues required;Verbal cues required;Needs further instruction    Donning Prosthesis Moderate assist;Minimal assist   see prosthetic care comments for details                       PT Short Term Goals - 05/09/21 1130       PT SHORT TERM GOAL #1   Title Patient donnes prosthesis with cues only / no physical assist.    Time 4    Period Weeks    Status New    Target Date 06/07/21      PT SHORT TERM GOAL #2   Title Patient tolerates prosthesis >8 hrs total /day without skin issues or limb pain    Time 4    Period Weeks    Status New    Target Date 06/07/21      PT SHORT TERM GOAL #3   Title Patient able to transfer sit/stand std chair height with armrests & reach 10" with RW support  safely.    Time 4    Period Weeks    Status New    Target Date 06/07/21      PT SHORT TERM GOAL #4   Title Patient ambulates 100' with RW & prosthesis with supervision.    Time 4    Period Weeks    Status New    Target Date 06/07/21      PT SHORT TERM GOAL #5   Title .Patient negotiates ramps & curbs with RW & prosthesis with modA.    Time 4    Period Weeks    Status New    Target Date 06/07/21               PT Long Term Goals - 05/09/21 1127       PT LONG TERM GOAL #1   Title Patient & family are independent with prosthetic care and pt able to donne prosthesis modified independent.    Time 12    Period Weeks    Status New    Target Date 08/02/21      PT LONG TERM GOAL #2   Title Patient tolerates prosthesis wear >90% of awake hours without skin or limb pain issues.    Time 12    Period Weeks    Status New    Target Date 08/02/21      PT LONG TERM GOAL #3   Title Berg Balance >/= 36/56 with RW support to indicate lower fall risk    Time 12    Period Weeks    Status New    Target Date 08/02/21      PT LONG TERM GOAL #4   Title Patient ambulates 300' with RW & prosthesis modified independent.    Time 12    Period Weeks    Status New    Target Date 08/02/21      PT LONG TERM GOAL #5   Title Patient negotiates ramps, curbs with RW & stairs with single rail & prosthesis modified independent.    Time 12    Period  Weeks    Status New    Target Date 08/02/21                   Plan - 05/17/21 0805     Clinical Impression Statement Patient's niece appears safe to assist him for short distance ambulation between 2 chairs.  He needs significant cues for donning and gait along with physical assist. He improved distance of prosthetic gait today.  Pt continues to need skilled care to improve function & mobility.    Personal Factors and Comorbidities Comorbidity 3+;Past/Current Experience;Other   lives alone   Comorbidities arthritis, glaucoma, HTN, osteomyelitis, PVD, DM2, CVA cerebellum, right transmetatarsal amputation, NSTEMI, gout    Examination-Activity Limitations Locomotion Level;Reach Overhead;Stairs;Stand;Toileting;Transfers    Examination-Participation Restrictions Community Activity    Stability/Clinical Decision Making Evolving/Moderate complexity    Rehab Potential Good    PT Frequency 2x / week    PT Duration 12 weeks    PT Treatment/Interventions ADLs/Self Care Home Management;DME Instruction;Gait training;Stair training;Functional mobility training;Therapeutic activities;Therapeutic exercise;Balance training;Neuromuscular re-education;Patient/family education;Prosthetic Training;Manual techniques;Passive range of motion;Vestibular    PT Next Visit Plan review prosthetic care, prosthetic gait with RW, standing balance activities, work on car transfers weather permitting    Consulted and Agree with Plan of Care Patient;Family member/caregiver    Family Member Consulted neice, Arlana Lindau             Patient will  benefit from skilled therapeutic intervention in order to improve the following deficits and impairments:  Abnormal gait, Decreased activity tolerance, Decreased balance, Decreased cognition, Decreased endurance, Decreased knowledge of use of DME, Decreased mobility, Decreased range of motion, Decreased strength, Impaired flexibility, Postural dysfunction, Prosthetic  Dependency  Visit Diagnosis: Unsteadiness on feet  Other abnormalities of gait and mobility  Muscle weakness (generalized)  Stiffness of left knee, not elsewhere classified  Abnormal posture     Problem List Patient Active Problem List   Diagnosis Date Noted   Abscess of bursa of left ankle 11/22/2020   Subacute osteomyelitis, left ankle and foot ([place])    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 ([place]) 04/12/2020   Acute encephalopathy 03/20/2020   SIRS (systemic inflammatory response syndrome) ([place]) 03/20/2020   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[place]) 03/20/2020   ARF (acute renal failure) ([place]) 03/20/2020   Midfoot ulcer, left, limited to breakdown of skin ([place]) 12/29/2018   Occipital stroke ([place]) 10/31/2018   DM2 (diabetes mellitus, type 2) ([place]) 10/31/2018   NSTEMI (non-ST elevated myocardial infarction) ([place]) 05/28/2018   Syncope 05/28/2018   HTN (hypertension) 05/28/2018   Hyperlipemia, mixed 05/28/2018   Rash and nonspecific skin eruption 12/16/2017   Pain in right hand 09/11/2017   Carpal tunnel syndrome, right upper limb 07/01/2017   Arthritis of carpometacarpal [facility]) joint of left thumb 12/30/2016   Midfoot skin ulcer, right, limited to breakdown of skin ([place]) 07/22/2016   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[place]) 09/23/2014   Osteomyelitis of ankle or foot, left, acute ([place]) 08/23/2014   Cellulitis 10/12/2012   Chest pain 10/12/2012   Tobacco abuse 10/12/2012   ABSCESS, AXILLA, LEFT 02/23/2008   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 [place], [place], [address] Phone: [phone]   Fax:  [phone]  Name: Eaden Hettinger MRN: [id number] Date of Birth: [birth year]

## 2021-05-18 DIAGNOSIS — M19041 Primary osteoarthritis, right hand: Secondary | ICD-10-CM | POA: Diagnosis not present

## 2021-05-18 DIAGNOSIS — E785 Hyperlipidemia, unspecified: Secondary | ICD-10-CM | POA: Diagnosis not present

## 2021-05-18 DIAGNOSIS — N179 Acute kidney failure, unspecified: Secondary | ICD-10-CM | POA: Diagnosis not present

## 2021-05-18 DIAGNOSIS — E11621 Type 2 diabetes mellitus with foot ulcer: Secondary | ICD-10-CM | POA: Diagnosis not present

## 2021-05-18 DIAGNOSIS — E1142 Type 2 diabetes mellitus with diabetic polyneuropathy: Secondary | ICD-10-CM | POA: Diagnosis not present

## 2021-05-18 DIAGNOSIS — E1169 Type 2 diabetes mellitus with other specified complication: Secondary | ICD-10-CM | POA: Diagnosis not present

## 2021-05-21 ENCOUNTER — Encounter: Payer: Self-pay | Admitting: *Deleted

## 2021-05-21 ENCOUNTER — Other Ambulatory Visit: Payer: Self-pay | Admitting: *Deleted

## 2021-05-21 ENCOUNTER — Encounter: Payer: Medicare Other | Admitting: Physical Therapy

## 2021-05-21 NOTE — Patient Outreach (Signed)
Care Management Clinical Social Work Note  05/21/2021 Name: [name] MRN: [id number] DOB: August 17, 1940  [name] is a 80 y.o. year old male who is a primary care patient of Casey Cha, MD.  The Care Management team was consulted for assistance with chronic disease management and coordination needs.  Engaged with patient's niece by telephone for initial visit in response to provider referral for social work chronic care management and care coordination services  Consent to Services:  [name] was given information about Care Management services today including:  Care Management services includes personalized support from designated clinical staff supervised by his physician, including individualized plan of care and coordination with other care providers 24/7 contact phone numbers for assistance for urgent and routine care needs. The patient may stop case management services at any time by phone call to the office staff.  Patient agreed to services and consent obtained.   Assessment: Review of patient past medical history, allergies, medications, and health status, including review of relevant consultants reports was performed today as part of a comprehensive evaluation and provision of chronic care management and care coordination services.  SDOH (Social Determinants of Health) assessments and interventions performed:  SDOH Interventions    Flowsheet Row Most Recent Value  SDOH Interventions   Food Insecurity Interventions Intervention Not Indicated, Other (Comment)  [Verified by Niece - [name]]  Financial Strain Interventions Intervention Not Indicated, Other (Comment)  [Verified by Niece - [name]]  Housing Interventions Intervention Not Indicated, Other (Comment)  [Verified by Niece - [name]]  Intimate Partner Violence Interventions Intervention Not Indicated, Other (Comment)  [Verified by Niece - [name]]  Physical Activity Interventions  Intervention Not Indicated, Other (Comments)  [Verified by Niece - [name]]  Stress Interventions Intervention Not Indicated, Offered [name], Other (Comment)  [Verified by Niece - [name]]  Social Connections Interventions Intervention Not Indicated, Other (Comment)  [Verified by Niece - [name]]  Transportation Interventions Intervention Not Indicated, Other (Comment)  [Verified by Niece - [name]]        Advanced Directives Status: Not ready or willing to discuss.  Care Plan  No Known Allergies  Outpatient Encounter Medications as of 05/21/2021  Medication Sig Note   atorvastatin (LIPITOR) 80 MG tablet Take 80 mg by mouth daily.    brimonidine (ALPHAGAN) 0.2 % ophthalmic solution Place 1 drop into the right eye 2 (two) times daily.    clopidogrel (PLAVIX) 75 MG tablet Take 1 tablet (75 mg total) by mouth daily. 11/22/2020: Not clear when he took last.  Per Jan, RN note may not be taking his medications as ordered.   cycloSPORINE (RESTASIS) 0.05 % ophthalmic emulsion Place 1 drop into both eyes 2 (two) times daily.    Dorzolamide HCl-Timolol Mal PF 2-0.5 % SOLN Place 1 drop into both eyes 2 (two) times daily.    dorzolamidel-timolol (COSOPT) 22.3-6.8 MG/ML SOLN ophthalmic solution Place 1 drop into the right eye 2 (two) times daily.    gabapentin (NEURONTIN) 300 MG capsule Take 1 capsule (300 mg total) by mouth at bedtime.    hydroxypropyl methylcellulose / hypromellose (ISOPTO TEARS / GONIOVISC) 2.5 % ophthalmic solution Place 1 drop into both eyes 3 (three) times daily as needed for dry eyes.    ketorolac (ACULAR) 0.5 % ophthalmic solution Place 1 drop into both eyes daily.    oxyCODONE-acetaminophen (PERCOCET/ROXICET) 5-325 MG tablet Take 1 tablet by mouth every 6 (six) hours as needed for severe pain.  No facility-administered encounter medications on file as of 05/21/2021.    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 ([place]) 04/12/2020   Acute encephalopathy 03/20/2020   SIRS (systemic inflammatory response syndrome) ([place]) 03/20/2020   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[place]) 03/20/2020   ARF (acute renal failure) ([place]) 03/20/2020   Midfoot ulcer, left, limited to breakdown of skin ([place]) 12/29/2018   Occipital stroke ([place]) 10/31/2018   DM2 (diabetes mellitus, type 2) ([place]) 10/31/2018   NSTEMI (non-ST elevated myocardial infarction) ([place]) 05/28/2018   Syncope 05/28/2018   HTN (hypertension) 05/28/2018   Hyperlipemia, mixed 05/28/2018   Rash and nonspecific skin eruption 12/16/2017   Pain in right hand 09/11/2017   Carpal tunnel syndrome, right upper limb 07/01/2017   Arthritis of carpometacarpal [facility]) joint of left thumb 12/30/2016   Midfoot skin ulcer, right, limited to breakdown of skin ([place]) 07/22/2016   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[place]) 09/23/2014   Osteomyelitis of ankle or foot, left, acute ([place]) 08/23/2014   Cellulitis 10/12/2012   Chest pain 10/12/2012   Tobacco abuse 10/12/2012   ABSCESS, AXILLA, LEFT 02/23/2008   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   CHEST WALL PAIN, ACUTE 12/03/2007   CANDIDIASIS, GLANS PENIS 09/16/2007   HEADACHE 08/04/2007   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  Limited Social Support, Level of Care Concerns, ADL/IADL Limitations, Social Isolation, Limited Access to Caregiver, and Lacks Knowledge of  [employer].  Care Plan : LCSW Plan of Care  Updates made by Francis Gaines, LCSW since 05/21/2021 12:00 AM     Problem: Maintain My Quality of Life Through [place].   Priority: High     Goal: Maintain My Quality of Life Through [place].   Start Date: 05/21/2021  Expected End Date: 08/20/2021  This Visit's Progress: On track  Priority: High  Note:   Current Barriers:   Patient with History of Partial Amputation of Right Foot Toe and Status Post Transmetatarsal Amputation of Foot, Osteoarthritis, Osteomyelitis of Ankle and Right Foot and Type II Diabetes Mellitus needs Support, Education, Resources, Referrals, Advocacy and Care Coordination to resolve unmet personal care needs in the home. Patient is unable to self-administer medications as prescribed or consistently perform ADL's/IADL's independently. Lacks knowledge of available community agencies and resources. Clinical Goals:  Patient and niece will work with LCSW in an effort to apply for Adult Medicaid, through the [place], as well as Publishing rights manager, through [employer].   Patient will attend all scheduled medical appointments, as evidenced by patient report and care team review of appointment completion in electronic medical record. Patient will demonstrate improved health management independence, as evidenced by having Bryant in place. Clinical Interventions: Patient's niece interviewed and appropriate assessments performed. Collaboration with Primary Care Physician, Dr. Leeroy [name] regarding development and update of comprehensive plan of care as evidenced by provider attestation and  co-signature. Inter-disciplinary care team collaboration (see longitudinal plan of care). Interventions performed:  Problem Solving/Task-Centered Solutions Identified, Psychoeducation/Health Education Utilized, Quality of  Sleep Assessed and Sleep Hygiene Techniques Promoted, Caregiver Stress Acknowledged and Consideration of Personal Care Services Encouraged. Patient's niece will receive the following list of resource information and applications, e-mailed to her ([email or web address]), per her request, on 05/21/2021:   Medicaid Tips, Application, and Dance movement psychotherapist Instructions.  Personal Care Services Instructions, Application and Agency Provider List.  [place].  Adult Day Care Programs. Discussed plans with niece for ongoing care management follow-up and provided direct contact information for care management team. Assisted niece with obtaining information about health plan benefits through [employer]. Provided education to niece regarding level of care options. Assessed needs, level of care concerns, basic eligibility and provided education on Adult Medicaid Application process and Personal Care Services Application process.   Identified resources and durable medical equipment needed in the home to improve safety and promote independence. Patient Goals/Self-Care Activities:  Review resource information and applications with niece, obtaining assistance with application completion for Adult Medicaid, and submission to the [place], as well as assistance with application completion for [employer], and submission to [employer].   Work with [employer] on a bi-weekly basis, until approved for Adult Medicaid and [employer]. Contact LCSW directly (# [id number]) if you have questions, need assistance, or if additional social work needs are identified between now and our next scheduled telephone outreach call. Follow-Up Plan:  Scheduling Care Guide notified of need for follow-up appointment.      Nat Christen, BSW, MSW, LCSW  Licensed [facility]  Mailing [place] [address], [place], [place] [address] Physical Address-[address] [place], [place] [address] Toll Free Main # [phone] Fax # [phone] Cell # [phone]  [email or web address]

## 2021-05-23 ENCOUNTER — Other Ambulatory Visit: Payer: Self-pay | Admitting: *Deleted

## 2021-05-23 ENCOUNTER — Encounter: Payer: Medicare Other | Admitting: Physical Therapy

## 2021-05-23 NOTE — Patient Outreach (Signed)
[facility]) Care Management  05/23/2021  Concepcion Kirkpatrick 1941/02/10 [id number]   Outgoing call placed to member's niece, [name], successful.  Denies any urgent concerns, encouraged to contact this care manager with questions.  Agrees to follow up within the next 2 weeks.  Care Plan : General Plan of Care (Adult)  Updates made by Valente David, RN since 05/23/2021 12:00 AM     Problem: Risk for hospital admission related to noncompliance and knowledge defecit regarding management of medical conditions   Priority: High     Long-Range Goal: Patient will show appropriate management of chronic medical conditions evidenced by no hospital admissions in the next 6 months   Start Date: 04/05/2021  Expected End Date: 10/02/2021  This Visit's Progress: On track  Recent Progress: On track  Priority: High  Note:   Current Barriers:  Knowledge Deficits related to plan of care for management of DMII and osteomyelitis with amputation Chronic Disease Management support and education needs related to DMII and osteomyelitis with amputation Non-adherence to prescribed medication regimen  RNCM Clinical Goal(s):  Patient will verbalize understanding of plan for management of DMII and BKA take all medications exactly as prescribed and will call provider for medication related questions attend all scheduled medical appointments: Dr. Sharol Given work with Home health PT to increase strength and learn mobility on new prosthetic not experience hospital admission. Hospital Admissions in last 6 months = 1  through collaboration with RN Care manager, provider, and care team.   Interventions: 1:1 collaboration with primary care provider regarding development and update of comprehensive plan of care as evidenced by provider attestation and co-signature Inter-disciplinary care team collaboration (see longitudinal plan of care) Evaluation of current treatment plan related to  self management and patient's  adherence to plan as established by provider  Amputation with new prosthesisGoal on track:  Yes Evaluation of current treatment plan related to DMII and BKA , ADL IADL limitations and Inability to perform ADL's independently self-management and patient's adherence to plan as established by provider. Discussed plans with patient for ongoing care management follow up and provided patient with direct contact information for care management team Reviewed medications with patient and discussed importance of taking in effort to decrease risk of admission; Collaborated with Dr. Sharol Given and [name] regarding HHPT; Reviewed scheduled/upcoming provider appointments including; Screening for signs and symptoms of depression related to chronic disease state;   Patient Goals/Self-Care Activities: Patient will self administer medications as prescribed Patient will attend all scheduled provider appointments Patient will continue to perform ADL's independently Patient will continue to perform IADL's independently  Follow Up Plan:  Telephone follow up appointment with care management team member scheduled for:  12/19 The patient has been provided with contact information for the care management team and has been advised to call with any health related questions or concerns.    Update 11/18 - Per niece, member still has not started PT for new prosthetic leg.  There was a request to have in home PT as member has not been able to get himself up, dressed, and out of the door to a transport vehicle independently, however family was notified that home PT is not suitable for prosthesis.  Member has appointment scheduled for next week to have evaluation for outpatient PT but there is still the barrier of member not having anyone in the home to help get him ready for visits.  He has continued to decline offers for ALF or adult day programs such as [facility], but  per niece, he is willing to have personal care aide come into the  home.  She state he now has Medicaid, agrees to have referral to CSW for assistance.    She also report that member was seen by urology and told that there is concern for prostate cancer, will need a biopsy.  She has been trying to get appointment scheduled however she was notified that they no longer serve Medicaid patients.  She will contact a different urology office within the next week to schedule office visit and biopsy.     Update 11/30 - Niece report member has been able to start outpatient PT sessions, she is still working with him on compliance issues.  Advised that CSW would be reaching out to her to discuss process for PCS.  Notified that member does not have appointment with urology office, will have appointment and biopsy on Monday 12/5.  Update 12/7 - Spoke with niece, confirms member had biopsy on Monday and received results today, positive for cancer.  He has follow upon 12/16 to discuss treatment options, she is not confident that he will agree to treatment as he has said in the past that he does not want to see any more doctors.  She will encourage him to listen to options and make final decision.  Meanwhile, she and her sister will both work to help get member PCS started for additional help in the home.    Valente David, [place], MSN [place] [phone]

## 2021-05-24 ENCOUNTER — Encounter: Payer: Self-pay | Admitting: Physical Therapy

## 2021-05-24 ENCOUNTER — Other Ambulatory Visit: Payer: Self-pay

## 2021-05-24 ENCOUNTER — Ambulatory Visit (INDEPENDENT_AMBULATORY_CARE_PROVIDER_SITE_OTHER): Payer: Medicare Other | Admitting: Physical Therapy

## 2021-05-24 DIAGNOSIS — R2681 Unsteadiness on feet: Secondary | ICD-10-CM

## 2021-05-24 DIAGNOSIS — M6281 Muscle weakness (generalized): Secondary | ICD-10-CM | POA: Diagnosis not present

## 2021-05-24 DIAGNOSIS — M25662 Stiffness of left knee, not elsewhere classified: Secondary | ICD-10-CM | POA: Diagnosis not present

## 2021-05-24 DIAGNOSIS — R293 Abnormal posture: Secondary | ICD-10-CM | POA: Diagnosis not present

## 2021-05-24 DIAGNOSIS — R2689 Other abnormalities of gait and mobility: Secondary | ICD-10-CM

## 2021-05-24 NOTE — Therapy (Signed)
[facility] Physical Therapy [address] [place], [place], [address] Phone: [phone]   Fax:  [phone]  Physical Therapy Treatment  Patient Details  Name: [name] MRN: [id number] Date of Birth: 07-Feb-1941 Referring Provider (PT): Meridee Score, MD   Encounter Date: 05/24/2021   PT End of Session - 05/24/21 0759     Visit Number 4    Number of Visits 25    Date for PT Re-Evaluation 08/02/21    Authorization Type UHC Medicare    PT Start Time 0759    PT Stop Time 0837    PT Time Calculation (min) 38 min    Equipment Utilized During Treatment Gait belt    Activity Tolerance Patient tolerated treatment well;Patient limited by fatigue    Behavior During Therapy [facility] for tasks assessed/performed             Past Medical History:  Diagnosis Date   Arthritis    Denies   Glaucoma    High cholesterol    Hypertension    denies   Osteomyelitis ([place])    Right great toe   Peripheral vascular disease ([place])    diabetic  with osteomylitis   Pre-diabetes    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    Past Surgical History:  Procedure Laterality Date   AMPUTATION  07/23/2011   Procedure: AMPUTATION DIGIT;  Surgeon: Newt Minion, MD;  Location: [place];  Service: Orthopedics;  Laterality: Right;  Right Great Toe Amputation   AMPUTATION Right 09/18/2012   Procedure: Right Foot 2nd Ray Amputation;  Surgeon: Newt Minion, MD;  Location: [place];  Service: Orthopedics;  Laterality: Right;  Right Foot 2nd Ray Amputation   AMPUTATION Right 10/14/2012   Procedure: AMPUTATION FOOT;  Surgeon: Newt Minion, MD;  Location: [place];  Service: Orthopedics;  Laterality: Right;  Right Foot Transmetatarsal Amputation   AMPUTATION Left 09/23/2014   Procedure: Left Foot Transmetatarsal Amputation;  Surgeon: Newt Minion, MD;  Location: [place];  Service: Orthopedics;  Laterality: Left;   AMPUTATION Left 11/22/2020   Procedure: LEFT BELOW KNEE AMPUTATION;  Surgeon: Newt Minion, MD;   Location: [place];  Service: Orthopedics;  Laterality: Left;   BACK SURGERY     lower   CARPAL TUNNEL RELEASE Right 01/30/2018   Procedure: RIGHT CARPAL TUNNEL RELEASE;  Surgeon: Newt Minion, MD;  Location: [place];  Service: Orthopedics;  Laterality: Right;   CIRCUMCISION  2010   COLONOSCOPY     FOOT FRACTURE SURGERY Left 1970's   "broke it playing football" (10/12/2012)   HUMERUS FRACTURE SURGERY W/ IMPLANT Right 1960's   "put a plate in it" (12/11/348)   I & D EXTREMITY Right 03/24/2020   Procedure: EXCISION RIGHT MEDIAL CUNEFORM;  Surgeon: Newt Minion, MD;  Location: [place];  Service: Orthopedics;  Laterality: Right;   INCISION AND DRAINAGE OF WOUND Left 2006   "foot" (10/12/2012)   [place] SURGERY  2008    There were no vitals filed for this visit.   Subjective Assessment - 05/24/21 0800     Subjective He had biopsy of prostate with finding of Cancer.  He has appointment next Friday to discuss treatment including referral to oncologist.    Patient is accompained by: Family member   niece   Pertinent History arthritis, glaucoma, HTN, osteomyelitis, PVD, DM2, CVA cerebellum, right toe amputations, NSTEMI, gout    Patient Stated Goals use prosthesis to go into community including church & fishing.  Currently in Pain? No/denies                               [facility] Adult PT Treatment/Exercise - 05/24/21 0800       Transfers   Transfers Sit to Stand;Stand to Sit;Stand Pivot Transfers    Sit to Stand 4: Min assist;With upper extremity assist;With armrests;From chair/3-in-1;Other (comment)   to RW   Sit to Stand Details Verbal cues for technique;Tactile cues for weight shifting;Other (comment)   minA to stabilize RW   Stand to Sit 4: Min guard;With upper extremity assist;With armrests;To chair/3-in-1;Other (comment)   from RW   Stand to Sit Details (indicate cue type and reason) Verbal cues for sequencing;Verbal cues for technique;Manual facilitation for  weight shifting    Stand Pivot Transfers 3: Mod assist;4: Min assist   w/c to/from car seat, 3 transfers, modA first then minA 2nd & 3rd   Stand Pivot Transfer Details (indicate cue type and reason) PT demo & verbal cues to pt & niece car transfers including seat position, w/c position, pt hand placement, turning feet during transfer & in/out of van and assistance.      Ambulation/Gait   Ambulation/Gait Yes    Ambulation/Gait Assistance 4: Min assist    Ambulation/Gait Assistance Details PT monitoring RW but pt able to control without PT assist today.  His niece assisted 2nd gait with minor cues.    Ambulation Distance (Feet) 120 Feet   120' with PT, 60' with niece with PT supervising.   Assistive device Rolling walker;Prosthesis    Ambulation Surface Level;Indoor      Exercises   Exercises Knee/Hip;Other Exercises      Prosthetics   Prosthetic Care Comments  pt donned liner & prosthesis with verbal & tactile cues but did not require physical assist.    Current prosthetic wear tolerance (days/week)  daily per pt report but he lives alone & needs assist / cueing to donne correctly    Current prosthetic wear tolerance (#hours/day)  reports 2hrs 2x/day    Education Provided Proper Donning;Proper wear schedule/adjustment;Other (comment)   see prosthetic care comments   Person(s) Educated Patient;Caregiver(s)    Education Method Explanation;Verbal cues;Tactile cues;Demonstration    Education Method Verbalized understanding;Tactile cues required;Verbal cues required;Needs further instruction    Donning Prosthesis Supervision                       PT Short Term Goals - 05/09/21 1130       PT SHORT TERM GOAL #1   Title Patient donnes prosthesis with cues only / no physical assist.    Time 4    Period Weeks    Status New    Target Date 06/07/21      PT SHORT TERM GOAL #2   Title Patient tolerates prosthesis >8 hrs total /day without skin issues or limb pain    Time 4     Period Weeks    Status New    Target Date 06/07/21      PT SHORT TERM GOAL #3   Title Patient able to transfer sit/stand std chair height with armrests & reach 10" with RW support safely.    Time 4    Period Weeks    Status New    Target Date 06/07/21      PT SHORT TERM GOAL #4   Title Patient ambulates 100' with RW & prosthesis with supervision.  Time 4    Period Weeks    Status New    Target Date 06/07/21      PT SHORT TERM GOAL #5   Title .Patient negotiates ramps & curbs with RW & prosthesis with modA.    Time 4    Period Weeks    Status New    Target Date 06/07/21               PT Long Term Goals - 05/09/21 1127       PT LONG TERM GOAL #1   Title Patient & family are independent with prosthetic care and pt able to donne prosthesis modified independent.    Time 12    Period Weeks    Status New    Target Date 08/02/21      PT LONG TERM GOAL #2   Title Patient tolerates prosthesis wear >90% of awake hours without skin or limb pain issues.    Time 12    Period Weeks    Status New    Target Date 08/02/21      PT LONG TERM GOAL #3   Title Berg Balance >/= 36/56 with RW support to indicate lower fall risk    Time 12    Period Weeks    Status New    Target Date 08/02/21      PT LONG TERM GOAL #4   Title Patient ambulates 300' with RW & prosthesis modified independent.    Time 12    Period Weeks    Status New    Target Date 08/02/21      PT LONG TERM GOAL #5   Title Patient negotiates ramps, curbs with RW & stairs with single rail & prosthesis modified independent.    Time 12    Period Weeks    Status New    Target Date 08/02/21                   Plan - 05/24/21 0800     Clinical Impression Statement Patient required less assist to donne prosthesis, sit to stand and prosthetic gait with RW.  His niece verbalized understanding of car transfers with prosthesis.  Pt continues to benefit from skilled PT.    Personal Factors and  Comorbidities Comorbidity 3+;Past/Current Experience;Other   lives alone   Comorbidities arthritis, glaucoma, HTN, osteomyelitis, PVD, DM2, CVA cerebellum, right transmetatarsal amputation, NSTEMI, gout    Examination-Activity Limitations Locomotion Level;Reach Overhead;Stairs;Stand;Toileting;Transfers    Examination-Participation Restrictions Community Activity    Stability/Clinical Decision Making Evolving/Moderate complexity    Rehab Potential Good    PT Frequency 2x / week    PT Duration 12 weeks    PT Treatment/Interventions ADLs/Self Care Home Management;DME Instruction;Gait training;Stair training;Functional mobility training;Therapeutic activities;Therapeutic exercise;Balance training;Neuromuscular re-education;Patient/family education;Prosthetic Training;Manual techniques;Passive range of motion;Vestibular    PT Next Visit Plan review prosthetic care, prosthetic gait with RW, standing balance activities    Consulted and Agree with Plan of Care Patient;Family member/caregiver    Family Member Consulted neice, Arlana Lindau             Patient will benefit from skilled therapeutic intervention in order to improve the following deficits and impairments:  Abnormal gait, Decreased activity tolerance, Decreased balance, Decreased cognition, Decreased endurance, Decreased knowledge of use of DME, Decreased mobility, Decreased range of motion, Decreased strength, Impaired flexibility, Postural dysfunction, Prosthetic Dependency  Visit Diagnosis: Unsteadiness on feet  Other abnormalities of gait and mobility  Muscle weakness (generalized)  Stiffness of  left knee, not elsewhere classified  Abnormal posture     Problem List Patient Active Problem List   Diagnosis Date Noted   Abscess of bursa of left ankle 11/22/2020   Subacute osteomyelitis, left ankle and foot ([place])    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 ([place]) 04/12/2020   Acute encephalopathy 03/20/2020    SIRS (systemic inflammatory response syndrome) ([place]) 03/20/2020   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[place]) 03/20/2020   ARF (acute renal failure) ([place]) 03/20/2020   Midfoot ulcer, left, limited to breakdown of skin ([place]) 12/29/2018   Occipital stroke ([place]) 10/31/2018   DM2 (diabetes mellitus, type 2) ([place]) 10/31/2018   NSTEMI (non-ST elevated myocardial infarction) ([place]) 05/28/2018   Syncope 05/28/2018   HTN (hypertension) 05/28/2018   Hyperlipemia, mixed 05/28/2018   Rash and nonspecific skin eruption 12/16/2017   Pain in right hand 09/11/2017   Carpal tunnel syndrome, right upper limb 07/01/2017   Arthritis of carpometacarpal [facility]) joint of left thumb 12/30/2016   Midfoot skin ulcer, right, limited to breakdown of skin ([place]) 07/22/2016   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[place]) 09/23/2014   Osteomyelitis of ankle or foot, left, acute ([place]) 08/23/2014   Cellulitis 10/12/2012   Chest pain 10/12/2012   Tobacco abuse 10/12/2012   ABSCESS, AXILLA, LEFT 02/23/2008   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   CHEST WALL PAIN, ACUTE 12/03/2007   CANDIDIASIS, GLANS PENIS 09/16/2007   HEADACHE 08/04/2007   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

## 2021-05-28 ENCOUNTER — Ambulatory Visit (INDEPENDENT_AMBULATORY_CARE_PROVIDER_SITE_OTHER): Payer: Medicare Other | Admitting: Physical Therapy

## 2021-05-28 ENCOUNTER — Encounter: Payer: Self-pay | Admitting: Physical Therapy

## 2021-05-28 ENCOUNTER — Other Ambulatory Visit: Payer: Self-pay

## 2021-05-28 DIAGNOSIS — R2689 Other abnormalities of gait and mobility: Secondary | ICD-10-CM

## 2021-05-28 DIAGNOSIS — M6281 Muscle weakness (generalized): Secondary | ICD-10-CM

## 2021-05-28 DIAGNOSIS — M25662 Stiffness of left knee, not elsewhere classified: Secondary | ICD-10-CM | POA: Diagnosis not present

## 2021-05-28 DIAGNOSIS — R2681 Unsteadiness on feet: Secondary | ICD-10-CM

## 2021-05-28 DIAGNOSIS — R293 Abnormal posture: Secondary | ICD-10-CM | POA: Diagnosis not present

## 2021-05-28 NOTE — Therapy (Signed)
[facility] Physical Therapy [address] [place], [place], [address] Phone: [phone]   Fax:  [phone]  Physical Therapy Treatment  Patient Details  Name: [name] MRN: [id number] Date of Birth: 28-May-1941 Referring Provider (PT): Meridee Score, MD   Encounter Date: 05/28/2021   PT End of Session - 05/28/21 0804     Visit Number 5    Number of Visits 25    Date for PT Re-Evaluation 08/02/21    Authorization Type UHC Medicare    Progress Note Due on Visit 10    PT Start Time 0800    PT Stop Time 0841    PT Time Calculation (min) 41 min    Equipment Utilized During Treatment Gait belt    Activity Tolerance Patient tolerated treatment well;Patient limited by fatigue    Behavior During Therapy [facility] for tasks assessed/performed             Past Medical History:  Diagnosis Date   Arthritis    Denies   Glaucoma    High cholesterol    Hypertension    denies   Osteomyelitis ([place])    Right great toe   Peripheral vascular disease ([place])    diabetic  with osteomylitis   Pre-diabetes    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    Past Surgical History:  Procedure Laterality Date   AMPUTATION  07/23/2011   Procedure: AMPUTATION DIGIT;  Surgeon: Newt Minion, MD;  Location: [place];  Service: Orthopedics;  Laterality: Right;  Right Great Toe Amputation   AMPUTATION Right 09/18/2012   Procedure: Right Foot 2nd Ray Amputation;  Surgeon: Newt Minion, MD;  Location: [place];  Service: Orthopedics;  Laterality: Right;  Right Foot 2nd Ray Amputation   AMPUTATION Right 10/14/2012   Procedure: AMPUTATION FOOT;  Surgeon: Newt Minion, MD;  Location: [place];  Service: Orthopedics;  Laterality: Right;  Right Foot Transmetatarsal Amputation   AMPUTATION Left 09/23/2014   Procedure: Left Foot Transmetatarsal Amputation;  Surgeon: Newt Minion, MD;  Location: [place];  Service: Orthopedics;  Laterality: Left;   AMPUTATION Left 11/22/2020   Procedure: LEFT BELOW KNEE AMPUTATION;   Surgeon: Newt Minion, MD;  Location: [place];  Service: Orthopedics;  Laterality: Left;   BACK SURGERY     lower   CARPAL TUNNEL RELEASE Right 01/30/2018   Procedure: RIGHT CARPAL TUNNEL RELEASE;  Surgeon: Newt Minion, MD;  Location: [place];  Service: Orthopedics;  Laterality: Right;   CIRCUMCISION  2010   COLONOSCOPY     FOOT FRACTURE SURGERY Left 1970's   "broke it playing football" (10/12/2012)   HUMERUS FRACTURE SURGERY W/ IMPLANT Right 1960's   "put a plate in it" (5/40/0867)   I & D EXTREMITY Right 03/24/2020   Procedure: EXCISION RIGHT MEDIAL CUNEFORM;  Surgeon: Newt Minion, MD;  Location: [place];  Service: Orthopedics;  Laterality: Right;   INCISION AND DRAINAGE OF WOUND Left 2006   "foot" (10/12/2012)   [place] SURGERY  2008    There were no vitals filed for this visit.   Subjective Assessment - 05/28/21 0800     Subjective His niece worked this weekend so did not walk. He reports putting prosthesis on couple times.    Patient is accompained by: Family member   niece   Pertinent History arthritis, glaucoma, HTN, osteomyelitis, PVD, DM2, CVA cerebellum, right toe amputations, NSTEMI, gout    Patient Stated Goals use prosthesis to go into community including church & fishing.  Currently in Pain? No/denies                               [facility] Adult PT Treatment/Exercise - 05/28/21 0800       Transfers   Transfers Sit to Stand;Stand to Sit    Sit to Stand 4: Min assist;With upper extremity assist;With armrests;From chair/3-in-1;Other (comment)   to RW   Sit to Stand Details Verbal cues for technique;Tactile cues for weight shifting;Manual facilitation for weight shifting    Stand to Sit 4: Min guard;With upper extremity assist;With armrests;To chair/3-in-1;Other (comment)   from RW   Stand to Sit Details (indicate cue type and reason) Verbal cues for technique;Manual facilitation for weight shifting      Ambulation/Gait   Ambulation/Gait Yes     Ambulation/Gait Assistance 4: Min assist;3: Mod assist   [place] in turns & MinA straight path   Ambulation/Gait Assistance Details tactile & verbal cues on RW control, upright posture & wt shift over prosthesis.  Pt walked from PT gym to car at end of session.    Ambulation Distance (Feet) 175 Feet   100' & 175'   Assistive device Rolling walker;Prosthesis    Ambulation Surface Level;Indoor;Outdoor;Paved    Curb 3: Mod assist   RW & TTA prosthesis   Curb Details (indicate cue type and reason) PT demo, verbal & manual cues on technique to niece & pt.      Exercises   Exercises Knee/Hip;Other Exercises      Knee/Hip Exercises: Aerobic   Nustep seat 13 level 5 with BLEs & BUEs for 8 min LLE no need for strap to maintain on pedal today. PT able to palpate muscle activity      Prosthetics   Prosthetic Care Comments  prosthesis is loose distal to socket allowing foot rotation.  PT called prosthetist who was seeing pt immediately after PT to align & tighten prosthesis.    Current prosthetic wear tolerance (days/week)  daily per pt report but he lives alone & needs assist / cueing to donne correctly    Current prosthetic wear tolerance (#hours/day)  reports 2hrs 2x/day    Education Provided Proper Donning;Proper wear schedule/adjustment;Other (comment);Prosthetic cleaning   see prosthetic care comments   Person(s) Educated Patient;Caregiver(s)    Education Method Explanation;Verbal cues;Tactile cues    Education Method Verbalized understanding;Tactile cues required;Verbal cues required;Needs further instruction    Donning Prosthesis Minimal assist                       PT Short Term Goals - 05/09/21 1130       PT SHORT TERM GOAL #1   Title Patient donnes prosthesis with cues only / no physical assist.    Time 4    Period Weeks    Status New    Target Date 06/07/21      PT SHORT TERM GOAL #2   Title Patient tolerates prosthesis >8 hrs total /day without skin issues or limb pain     Time 4    Period Weeks    Status New    Target Date 06/07/21      PT SHORT TERM GOAL #3   Title Patient able to transfer sit/stand std chair height with armrests & reach 10" with RW support safely.    Time 4    Period Weeks    Status New    Target Date 06/07/21  PT SHORT TERM GOAL #4   Title Patient ambulates 7' with RW & prosthesis with supervision.    Time 4    Period Weeks    Status New    Target Date 06/07/21      PT SHORT TERM GOAL #5   Title .Patient negotiates ramps & curbs with RW & prosthesis with modA.    Time 4    Period Weeks    Status New    Target Date 06/07/21               PT Long Term Goals - 05/09/21 1127       PT LONG TERM GOAL #1   Title Patient & family are independent with prosthetic care and pt able to donne prosthesis modified independent.    Time 12    Period Weeks    Status New    Target Date 08/02/21      PT LONG TERM GOAL #2   Title Patient tolerates prosthesis wear >90% of awake hours without skin or limb pain issues.    Time 12    Period Weeks    Status New    Target Date 08/02/21      PT LONG TERM GOAL #3   Title Berg Balance >/= 36/56 with RW support to indicate lower fall risk    Time 12    Period Weeks    Status New    Target Date 08/02/21      PT LONG TERM GOAL #4   Title Patient ambulates 300' with RW & prosthesis modified independent.    Time 12    Period Weeks    Status New    Target Date 08/02/21      PT LONG TERM GOAL #5   Title Patient negotiates ramps, curbs with RW & stairs with single rail & prosthesis modified independent.    Time 12    Period Weeks    Status New    Target Date 08/02/21                   Plan - 05/28/21 0805     Clinical Impression Statement PT introduced negotiating curbs and will need further training with PT prior to attempting outside of PT.  Pt was able to ambulate out to car with PT assist for first time today which niece was impressed.    Personal Factors  and Comorbidities Comorbidity 3+;Past/Current Experience;Other   lives alone   Comorbidities arthritis, glaucoma, HTN, osteomyelitis, PVD, DM2, CVA cerebellum, right transmetatarsal amputation, NSTEMI, gout    Examination-Activity Limitations Locomotion Level;Reach Overhead;Stairs;Stand;Toileting;Transfers    Examination-Participation Restrictions Community Activity    Stability/Clinical Decision Making Evolving/Moderate complexity    Rehab Potential Good    PT Frequency 2x / week    PT Duration 12 weeks    PT Treatment/Interventions ADLs/Self Care Home Management;DME Instruction;Gait training;Stair training;Functional mobility training;Therapeutic activities;Therapeutic exercise;Balance training;Neuromuscular re-education;Patient/family education;Prosthetic Training;Manual techniques;Passive range of motion;Vestibular    PT Next Visit Plan continue review prosthetic care, prosthetic gait with RW, standing balance activities    Consulted and Agree with Plan of Care Patient;Family member/caregiver    Family Member Consulted neice, Arlana Lindau             Patient will benefit from skilled therapeutic intervention in order to improve the following deficits and impairments:  Abnormal gait, Decreased activity tolerance, Decreased balance, Decreased cognition, Decreased endurance, Decreased knowledge of use of DME, Decreased mobility, Decreased range of motion, Decreased strength, Impaired  flexibility, Postural dysfunction, Prosthetic Dependency  Visit Diagnosis: Unsteadiness on feet  Other abnormalities of gait and mobility  Muscle weakness (generalized)  Stiffness of left knee, not elsewhere classified  Abnormal posture     Problem List Patient Active Problem List   Diagnosis Date Noted   Abscess of bursa of left ankle 11/22/2020   Subacute osteomyelitis, left ankle and foot ([place])    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 ([place]) 04/12/2020   Acute encephalopathy  03/20/2020   SIRS (systemic inflammatory response syndrome) ([place]) 03/20/2020   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[place]) 03/20/2020   ARF (acute renal failure) ([place]) 03/20/2020   Midfoot ulcer, left, limited to breakdown of skin ([place]) 12/29/2018   Occipital stroke ([place]) 10/31/2018   DM2 (diabetes mellitus, type 2) ([place]) 10/31/2018   NSTEMI (non-ST elevated myocardial infarction) ([place]) 05/28/2018   Syncope 05/28/2018   HTN (hypertension) 05/28/2018   Hyperlipemia, mixed 05/28/2018   Rash and nonspecific skin eruption 12/16/2017   Pain in right hand 09/11/2017   Carpal tunnel syndrome, right upper limb 07/01/2017   Arthritis of carpometacarpal [facility]) joint of left thumb 12/30/2016   Midfoot skin ulcer, right, limited to breakdown of skin ([place]) 07/22/2016   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[place]) 09/23/2014   Osteomyelitis of ankle or foot, left, acute ([place]) 08/23/2014   Cellulitis 10/12/2012   Chest pain 10/12/2012   Tobacco abuse 10/12/2012   ABSCESS, AXILLA, LEFT 02/23/2008   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   CHEST WALL PAIN, ACUTE 12/03/2007   CANDIDIASIS, GLANS PENIS 09/16/2007   HEADACHE 08/04/2007   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 [place], [place], [address] Phone: [phone]   Fax:  [phone]  Name: Jamauri Kruzel MRN: [id number] Date of Birth: [birth year]

## 2021-05-30 ENCOUNTER — Other Ambulatory Visit: Payer: Self-pay

## 2021-05-30 ENCOUNTER — Ambulatory Visit (INDEPENDENT_AMBULATORY_CARE_PROVIDER_SITE_OTHER): Payer: Medicare Other | Admitting: Physical Therapy

## 2021-05-30 ENCOUNTER — Encounter: Payer: Self-pay | Admitting: Physical Therapy

## 2021-05-30 DIAGNOSIS — M6281 Muscle weakness (generalized): Secondary | ICD-10-CM | POA: Diagnosis not present

## 2021-05-30 DIAGNOSIS — R2689 Other abnormalities of gait and mobility: Secondary | ICD-10-CM | POA: Diagnosis not present

## 2021-05-30 DIAGNOSIS — R293 Abnormal posture: Secondary | ICD-10-CM

## 2021-05-30 DIAGNOSIS — M25662 Stiffness of left knee, not elsewhere classified: Secondary | ICD-10-CM | POA: Diagnosis not present

## 2021-05-30 DIAGNOSIS — R2681 Unsteadiness on feet: Secondary | ICD-10-CM | POA: Diagnosis not present

## 2021-05-30 NOTE — Therapy (Signed)
[facility] Physical Therapy [address] [place], [place], [address] Phone: [phone]   Fax:  [phone]  Physical Therapy Treatment  Patient Details  Name: [name] MRN: [id number] Date of Birth: 1941-05-24 Referring Provider (PT): Meridee Score, MD   Encounter Date: 05/30/2021   PT End of Session - 05/30/21 0802     Visit Number 6    Number of Visits 25    Date for PT Re-Evaluation 08/02/21    Authorization Type UHC Medicare    Progress Note Due on Visit 10    PT Start Time 0800    PT Stop Time 0840    PT Time Calculation (min) 40 min    Equipment Utilized During Treatment Gait belt    Activity Tolerance Patient tolerated treatment well;Patient limited by fatigue    Behavior During Therapy WFL for tasks assessed/performed             Past Medical History:  Diagnosis Date   Arthritis    Denies   Glaucoma    High cholesterol    Hypertension    denies   Osteomyelitis ([place])    Right great toe   Peripheral vascular disease ([place])    diabetic  with osteomylitis   Pre-diabetes    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    Past Surgical History:  Procedure Laterality Date   AMPUTATION  07/23/2011   Procedure: AMPUTATION DIGIT;  Surgeon: Newt Minion, MD;  Location: [place];  Service: Orthopedics;  Laterality: Right;  Right Great Toe Amputation   AMPUTATION Right 09/18/2012   Procedure: Right Foot 2nd Ray Amputation;  Surgeon: Newt Minion, MD;  Location: [place];  Service: Orthopedics;  Laterality: Right;  Right Foot 2nd Ray Amputation   AMPUTATION Right 10/14/2012   Procedure: AMPUTATION FOOT;  Surgeon: Newt Minion, MD;  Location: [place];  Service: Orthopedics;  Laterality: Right;  Right Foot Transmetatarsal Amputation   AMPUTATION Left 09/23/2014   Procedure: Left Foot Transmetatarsal Amputation;  Surgeon: Newt Minion, MD;  Location: [place];  Service: Orthopedics;  Laterality: Left;   AMPUTATION Left 11/22/2020   Procedure: LEFT BELOW KNEE AMPUTATION;   Surgeon: Newt Minion, MD;  Location: [place];  Service: Orthopedics;  Laterality: Left;   BACK SURGERY     lower   CARPAL TUNNEL RELEASE Right 01/30/2018   Procedure: RIGHT CARPAL TUNNEL RELEASE;  Surgeon: Newt Minion, MD;  Location: [place];  Service: Orthopedics;  Laterality: Right;   CIRCUMCISION  2010   COLONOSCOPY     FOOT FRACTURE SURGERY Left 1970's   "broke it playing football" (10/12/2012)   HUMERUS FRACTURE SURGERY W/ IMPLANT Right 1960's   "put a plate in it" (2/68/3419)   I & D EXTREMITY Right 03/24/2020   Procedure: EXCISION RIGHT MEDIAL CUNEFORM;  Surgeon: Newt Minion, MD;  Location: [place];  Service: Orthopedics;  Laterality: Right;   INCISION AND DRAINAGE OF WOUND Left 2006   "foot" (10/12/2012)   [place] SURGERY  2008    There were no vitals filed for this visit.   Subjective Assessment - 05/30/21 0800     Subjective He saw prosthetist after PT who tightened foot.  He has not worn since last PT session 2 days ago.    Patient is accompained by: Family member   niece   Pertinent History arthritis, glaucoma, HTN, osteomyelitis, PVD, DM2, CVA cerebellum, right toe amputations, NSTEMI, gout    Patient Stated Goals use prosthesis to go into community  including church & fishing.    Currently in Pain? No/denies                               [facility] Adult PT Treatment/Exercise - 05/30/21 0800       Transfers   Transfers Sit to Stand;Stand to Sit    Sit to Stand 4: Min assist;With upper extremity assist;With armrests;From chair/3-in-1;Other (comment);3: Mod assist   to RW, MinA from w/c and ModA from chair without armrests   Sit to Stand Details Verbal cues for technique;Tactile cues for weight shifting;Manual facilitation for weight shifting    Sit to Stand Details (indicate cue type and reason) PT verbal, demo & tactile/manual cues for technique from chairs without armrests.    Stand to Sit 4: Min guard;With upper extremity assist;With armrests;To  chair/3-in-1;Other (comment);4: Min assist   from RW, min guard w/c and minA chairs without armrests.   Stand to Sit Details (indicate cue type and reason) Verbal cues for technique;Manual facilitation for weight shifting    Stand to Sit Details PT verbal, demo & tactile/manual cues for technique from chairs without armrests      Ambulation/Gait   Ambulation/Gait Yes    Ambulation/Gait Assistance 4: Min assist;3: Mod assist   ModA in turns & MinA straight path   Ambulation/Gait Assistance Details manual cues on RW during turns but may be related to visual issues.    Ambulation Distance (Feet) 80 Feet   80' X 2 (40' curb 40') and 40'   Assistive device Rolling walker;Prosthesis    Curb 3: Mod assist   RW & TTA prosthesis   Curb Details (indicate cue type and reason) verbal & manual /tactile cues on technique.  He is off balance upon stepping up on curb as he does not extend LEs & gets feet too far forward under RW      Self-Care   Self-Care ADL's    ADL's Pt stood in front of w/c with RW support while PT donned his coat one arm at time with other support on RW with minA for balance.  Pt able to stand static with RW with only supervision while niece zipped coat      Exercises   Exercises Knee/Hip;Other Exercises      Knee/Hip Exercises: Aerobic   Nustep seat 13 level 6 with BLEs & BUEs for 8 min LLE no need for strap to maintain on pedal today. PT able to palpate muscle activity      Prosthetics   Prosthetic Care Comments  PT had pt attempt to donne prosthesis    Current prosthetic wear tolerance (days/week)  niece reports does not appear to wear prosthesis outside of PT unless she is there to help him donne it.    Current prosthetic wear tolerance (#hours/day)  reports 2hrs 2x/day    Education Provided Proper Donning;Proper wear schedule/adjustment;Other (comment)   see prosthetic care comments   Person(s) Educated Patient;Caregiver(s)    Education Method Explanation;Verbal cues;Tactile  cues;Demonstration    Education Method Verbalized understanding;Returned demonstration;Tactile cues required;Verbal cues required                       PT Short Term Goals - 05/09/21 1130       PT SHORT TERM GOAL #1   Title Patient donnes prosthesis with cues only / no physical assist.    Time 4    Period Weeks  Status New    Target Date 06/07/21      PT SHORT TERM GOAL #2   Title Patient tolerates prosthesis >8 hrs total /day without skin issues or limb pain    Time 4    Period Weeks    Status New    Target Date 06/07/21      PT SHORT TERM GOAL #3   Title Patient able to transfer sit/stand std chair height with armrests & reach 10" with RW support safely.    Time 4    Period Weeks    Status New    Target Date 06/07/21      PT SHORT TERM GOAL #4   Title Patient ambulates 100' with RW & prosthesis with supervision.    Time 4    Period Weeks    Status New    Target Date 06/07/21      PT SHORT TERM GOAL #5   Title .Patient negotiates ramps & curbs with RW & prosthesis with modA.    Time 4    Period Weeks    Status New    Target Date 06/07/21               PT Long Term Goals - 05/09/21 1127       PT LONG TERM GOAL #1   Title Patient & family are independent with prosthetic care and pt able to donne prosthesis modified independent.    Time 12    Period Weeks    Status New    Target Date 08/02/21      PT LONG TERM GOAL #2   Title Patient tolerates prosthesis wear >90% of awake hours without skin or limb pain issues.    Time 12    Period Weeks    Status New    Target Date 08/02/21      PT LONG TERM GOAL #3   Title Berg Balance >/= 36/56 with RW support to indicate lower fall risk    Time 12    Period Weeks    Status New    Target Date 08/02/21      PT LONG TERM GOAL #4   Title Patient ambulates 300' with RW & prosthesis modified independent.    Time 12    Period Weeks    Status New    Target Date 08/02/21      PT LONG TERM GOAL  #5   Title Patient negotiates ramps, curbs with RW & stairs with single rail & prosthesis modified independent.    Time 12    Period Weeks    Status New    Target Date 08/02/21                   Plan - 05/30/21 0803     Clinical Impression Statement PT progressed to chairs without armrests using UEs which he will need further work to enable activity outside of PT.  He continues to need skilled assist for curbs & is not safe outside of PT.    Personal Factors and Comorbidities Comorbidity 3+;Past/Current Experience;Other   lives alone   Comorbidities arthritis, glaucoma, HTN, osteomyelitis, PVD, DM2, CVA cerebellum, right transmetatarsal amputation, NSTEMI, gout    Examination-Activity Limitations Locomotion Level;Reach Overhead;Stairs;Stand;Toileting;Transfers    Examination-Participation Restrictions Community Activity    Stability/Clinical Decision Making Evolving/Moderate complexity    Rehab Potential Good    PT Frequency 2x / week    PT Duration 12 weeks    PT Treatment/Interventions ADLs/Self Care Home  Management;DME Instruction;Gait training;Stair training;Functional mobility training;Therapeutic activities;Therapeutic exercise;Balance training;Neuromuscular re-education;Patient/family education;Prosthetic Training;Manual techniques;Passive range of motion;Vestibular    PT Next Visit Plan check STGs next week, continue review prosthetic care, prosthetic gait with RW, standing balance activities    Consulted and Agree with Plan of Care Patient;Family member/caregiver    Family Member Consulted neice, Arlana Lindau             Patient will benefit from skilled therapeutic intervention in order to improve the following deficits and impairments:  Abnormal gait, Decreased activity tolerance, Decreased balance, Decreased cognition, Decreased endurance, Decreased knowledge of use of DME, Decreased mobility, Decreased range of motion, Decreased strength, Impaired flexibility,  Postural dysfunction, Prosthetic Dependency  Visit Diagnosis: Unsteadiness on feet  Other abnormalities of gait and mobility  Muscle weakness (generalized)  Stiffness of left knee, not elsewhere classified  Abnormal posture     Problem List Patient Active Problem List   Diagnosis Date Noted   Abscess of bursa of left ankle 11/22/2020   Subacute osteomyelitis, left ankle and foot ([place])    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 ([place]) 04/12/2020   Acute encephalopathy 03/20/2020   SIRS (systemic inflammatory response syndrome) ([place]) 03/20/2020   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[place]) 03/20/2020   ARF (acute renal failure) ([place]) 03/20/2020   Midfoot ulcer, left, limited to breakdown of skin ([place]) 12/29/2018   Occipital stroke ([place]) 10/31/2018   DM2 (diabetes mellitus, type 2) ([place]) 10/31/2018   NSTEMI (non-ST elevated myocardial infarction) ([place]) 05/28/2018   Syncope 05/28/2018   HTN (hypertension) 05/28/2018   Hyperlipemia, mixed 05/28/2018   Rash and nonspecific skin eruption 12/16/2017   Pain in right hand 09/11/2017   Carpal tunnel syndrome, right upper limb 07/01/2017   Arthritis of carpometacarpal ([place]) joint of left thumb 12/30/2016   Midfoot skin ulcer, right, limited to breakdown of skin ([place]) 07/22/2016   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[place]) 09/23/2014   Osteomyelitis of ankle or foot, left, acute ([place]) 08/23/2014   Cellulitis 10/12/2012   Chest pain 10/12/2012   Tobacco abuse 10/12/2012   ABSCESS, AXILLA, LEFT 02/23/2008   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   CHEST WALL PAIN, ACUTE 12/03/2007   CANDIDIASIS, GLANS PENIS 09/16/2007   HEADACHE 08/04/2007   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 [place], [place], [address] Phone: [phone]   Fax:  [phone]  Name: [name] MRN: [id number] Date of Birth: 1940-10-09

## 2021-06-01 DIAGNOSIS — C778 Secondary and unspecified malignant neoplasm of lymph nodes of multiple regions: Secondary | ICD-10-CM | POA: Diagnosis not present

## 2021-06-04 ENCOUNTER — Other Ambulatory Visit: Payer: Self-pay | Admitting: *Deleted

## 2021-06-04 ENCOUNTER — Encounter: Payer: Self-pay | Admitting: Physical Therapy

## 2021-06-04 ENCOUNTER — Ambulatory Visit (INDEPENDENT_AMBULATORY_CARE_PROVIDER_SITE_OTHER): Payer: Medicare Other | Admitting: Physical Therapy

## 2021-06-04 ENCOUNTER — Other Ambulatory Visit: Payer: Self-pay

## 2021-06-04 DIAGNOSIS — M6281 Muscle weakness (generalized): Secondary | ICD-10-CM

## 2021-06-04 DIAGNOSIS — R293 Abnormal posture: Secondary | ICD-10-CM

## 2021-06-04 DIAGNOSIS — R2681 Unsteadiness on feet: Secondary | ICD-10-CM | POA: Diagnosis not present

## 2021-06-04 DIAGNOSIS — R2689 Other abnormalities of gait and mobility: Secondary | ICD-10-CM

## 2021-06-04 DIAGNOSIS — M25662 Stiffness of left knee, not elsewhere classified: Secondary | ICD-10-CM

## 2021-06-04 NOTE — Therapy (Signed)
[facility] [address] [place], [place], [address] Phone: [phone]   Fax:  [phone]  Physical Therapy Treatment  Patient Details  Name: [name] MRN: [id number] Date of Birth: [birth year] Referring Provider (PT): Meridee Score, MD   Encounter Date: 06/04/2021   PT End of Session - 06/04/21 0759     Visit Number 7    Number of Visits 25    Date for PT Re-Evaluation 08/02/21    Authorization Type UHC Medicare    Progress Note Due on Visit 10    PT Start Time 0759    PT Stop Time 0841    PT Time Calculation (min) 42 min    Equipment Utilized During Treatment Gait belt    Activity Tolerance Patient tolerated treatment well;Patient limited by fatigue    Behavior During Therapy [facility] for tasks assessed/performed             Past Medical History:  Diagnosis Date   Arthritis    Denies   Glaucoma    High cholesterol    Hypertension    denies   Osteomyelitis ([place])    Right great toe   Peripheral vascular disease (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    Past Surgical History:  Procedure Laterality Date   AMPUTATION  07/23/2011   Procedure: AMPUTATION DIGIT;  Surgeon: Newt Minion, MD;  Location: [place];  Service: Orthopedics;  Laterality: Right;  Right Great Toe Amputation   AMPUTATION Right 09/18/2012   Procedure: Right Foot 2nd Ray Amputation;  Surgeon: Newt Minion, MD;  Location: [place];  Service: Orthopedics;  Laterality: Right;  Right Foot 2nd Ray Amputation   AMPUTATION Right 10/14/2012   Procedure: AMPUTATION FOOT;  Surgeon: Newt Minion, MD;  Location: [place];  Service: Orthopedics;  Laterality: Right;  Right Foot Transmetatarsal Amputation   AMPUTATION Left 09/23/2014   Procedure: Left Foot Transmetatarsal Amputation;  Surgeon: Newt Minion, MD;  Location: [place];  Service: Orthopedics;  Laterality: Left;   AMPUTATION Left 11/22/2020   Procedure: LEFT BELOW KNEE AMPUTATION;   Surgeon: Newt Minion, MD;  Location: [place];  Service: Orthopedics;  Laterality: Left;   BACK SURGERY     lower   CARPAL TUNNEL RELEASE Right 01/30/2018   Procedure: RIGHT CARPAL TUNNEL RELEASE;  Surgeon: Newt Minion, MD;  Location: [place];  Service: Orthopedics;  Laterality: Right;   CIRCUMCISION  2010   COLONOSCOPY     FOOT FRACTURE SURGERY Left 1970's   "broke it playing football" (10/12/2012)   HUMERUS FRACTURE SURGERY W/ IMPLANT Right 1960's   "put a plate in it" (3/82/5053)   I & D EXTREMITY Right 03/24/2020   Procedure: EXCISION RIGHT MEDIAL CUNEFORM;  Surgeon: Newt Minion, MD;  Location: [place];  Service: Orthopedics;  Laterality: Right;   INCISION AND DRAINAGE OF WOUND Left 2006   "foot" (10/12/2012)   [place] SURGERY  2008    There were no vitals filed for this visit.   Subjective Assessment - 06/04/21 0759     Subjective HIs niece went to his home but he did not feel like doing anything.    Patient is accompained by: Family member   niece   Pertinent History arthritis, glaucoma, HTN, osteomyelitis, PVD, DM2, CVA cerebellum, right toe amputations, NSTEMI, gout    Patient Stated Goals use prosthesis to go into community including church & fishing.  Currently in Pain? No/denies                               [facility] Adult PT Treatment/Exercise - 06/04/21 0800       Transfers   Transfers Sit to Stand;Stand to Sit    Sit to Stand 4: Min assist;With upper extremity assist;With armrests;From chair/3-in-1;Other (comment);3: Mod assist   to RW, MinA from w/c and ModA from chair without armrests   Sit to Stand Details Verbal cues for technique;Tactile cues for weight shifting;Manual facilitation for weight shifting    Stand to Sit 4: Min guard;With upper extremity assist;With armrests;To chair/3-in-1;Other (comment);4: Min assist   from RW, min guard w/c and minA chairs without armrests.   Stand to Sit Details (indicate cue type and reason) Verbal cues  for technique;Manual facilitation for weight shifting      Ambulation/Gait   Ambulation/Gait Yes    Ambulation/Gait Assistance 4: Min assist;3: Mod assist   ModA in turns & MinA straight path   Ambulation Distance (Feet) 80 Feet   80' X 2 (40' curb/ramp 40')   Assistive device Rolling walker;Prosthesis    Ramp 3: Mod assist   RA & TTA prosthesis   Ramp Details (indicate cue type and reason) verbal & manual cues on technique    Curb 3: Mod assist;4: Min assist   RW & TTA prosthesis, modA descending & MinA ascending   Curb Details (indicate cue type and reason) verbal & manual cues on technique      Self-Care   Self-Care ADL's    ADL's Pt stood in front of w/c with RW support while PT donned his coat one arm at time with other support on RW with minA for balance.  Pt able to stand static with RW with only supervision while niece zipped coat      Exercises   Exercises Knee/Hip;Other Exercises      Knee/Hip Exercises: Aerobic   Nustep seat 13 level 6 with BLEs & BUEs for 8 min LLE no need for strap to maintain on pedal today. PT able to palpate muscle activity      Prosthetics   Prosthetic Care Comments  PT recommended donning prosthesis upon arising and wearing 4-6 hours.    Current prosthetic wear tolerance (days/week)  niece reports does not appear to wear prosthesis outside of PT unless she is there to help him donne it.    Current prosthetic wear tolerance (#hours/day)  reports 2hrs 2x/day    Education Provided Proper Donning;Proper wear schedule/adjustment;Other (comment)   see prosthetic care comments   Person(s) Educated Patient;Caregiver(s)    Education Method Explanation;Verbal cues    Education Method Verbalized understanding;Returned demonstration;Verbal cues required;Needs further [place] Prosthesis Supervision                       PT Short Term Goals - 06/04/21 0841       PT SHORT TERM GOAL #1   Title Patient donnes prosthesis with cues  only / no physical assist.    Time 4    Period Weeks    Status Achieved    Target Date 06/07/21      PT SHORT TERM GOAL #2   Title Patient tolerates prosthesis >8 hrs total /day without skin issues or limb pain    Time 4    Period Weeks    Status Not Met  Target Date 06/07/21      PT SHORT TERM GOAL #3   Title Patient able to transfer sit/stand std chair height with armrests & reach 10" with RW support safely.    Time 4    Period Weeks    Status Partially Met    Target Date 06/07/21      PT SHORT TERM GOAL #4   Title Patient ambulates 100' with RW & prosthesis with supervision.    Time 4    Period Weeks    Status On-going    Target Date 06/07/21      PT SHORT TERM GOAL #5   Title .Patient negotiates ramps & curbs with RW & prosthesis with modA.    Time 4    Period Weeks    Status Achieved    Target Date 06/07/21               PT Long Term Goals - 06/04/21 0842       PT LONG TERM GOAL #1   Title Patient & family are independent with prosthetic care and pt able to donne prosthesis modified independent.    Time 12    Period Weeks    Status On-going    Target Date 08/02/21      PT LONG TERM GOAL #2   Title Patient tolerates prosthesis wear >90% of awake hours without skin or limb pain issues.    Time 12    Period Weeks    Status On-going    Target Date 08/02/21      PT LONG TERM GOAL #3   Title Berg Balance >/= 36/56 with RW support to indicate lower fall risk    Time 12    Period Weeks    Status On-going    Target Date 08/02/21      PT LONG TERM GOAL #4   Title Patient ambulates 300' with RW & prosthesis modified independent.    Time 12    Period Weeks    Status On-going    Target Date 08/02/21      PT LONG TERM GOAL #5   Title Patient negotiates ramps, curbs with RW & stairs with single rail & prosthesis modified independent.    Time 12    Period Weeks    Status On-going    Target Date 08/02/21                   Plan - 06/04/21  0759     Clinical Impression Statement Patient was able to donne prosthesis without physical assistance for first time today.  He met or partially met 3 fo STGs for first 30 days.  His niece is getting chair with armrests to enable to walk between w/c & this chair with his niece.    Personal Factors and Comorbidities Comorbidity 3+;Past/Current Experience;Other   lives alone   Comorbidities arthritis, glaucoma, HTN, osteomyelitis, PVD, DM2, CVA cerebellum, right transmetatarsal amputation, NSTEMI, gout    Examination-Activity Limitations Locomotion Level;Reach Overhead;Stairs;Stand;Toileting;Transfers    Examination-Participation Restrictions Community Activity    Stability/Clinical Decision Making Evolving/Moderate complexity    Rehab Potential Good    PT Frequency 2x / week    PT Duration 12 weeks    PT Treatment/Interventions ADLs/Self Care Home Management;DME Instruction;Gait training;Stair training;Functional mobility training;Therapeutic activities;Therapeutic exercise;Balance training;Neuromuscular re-education;Patient/family education;Prosthetic Training;Manual techniques;Passive range of motion;Vestibular    PT Next Visit Plan check remaining STGs, continue review prosthetic care, prosthetic gait with RW, standing balance activities    Consulted  and Agree with Plan of Care Patient;Family member/caregiver    Family Member Consulted neice, Arlana Lindau             Patient will benefit from skilled therapeutic intervention in order to improve the following deficits and impairments:  Abnormal gait, Decreased activity tolerance, Decreased balance, Decreased cognition, Decreased endurance, Decreased knowledge of use of DME, Decreased mobility, Decreased range of motion, Decreased strength, Impaired flexibility, Postural dysfunction, Prosthetic Dependency  Visit Diagnosis: Unsteadiness on feet  Other abnormalities of gait and mobility  Muscle weakness (generalized)  Stiffness of  left knee, not elsewhere classified  Abnormal posture     Problem List Patient Active Problem List   Diagnosis Date Noted   Abscess of bursa of left ankle 11/22/2020   Subacute osteomyelitis, left ankle and foot ([place])    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 ([place]) 04/12/2020   Acute encephalopathy 03/20/2020   SIRS (systemic inflammatory response syndrome) ([place]) 03/20/2020   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[place]) 03/20/2020   ARF (acute renal failure) ([place]) 03/20/2020   Midfoot ulcer, left, limited to breakdown of skin ([place]) 12/29/2018   Occipital stroke ([place]) 10/31/2018   DM2 (diabetes mellitus, type 2) ([place]) 10/31/2018   NSTEMI (non-ST elevated myocardial infarction) ([place]) 05/28/2018   Syncope 05/28/2018   HTN (hypertension) 05/28/2018   Hyperlipemia, mixed 05/28/2018   Rash and nonspecific skin eruption 12/16/2017   Pain in right hand 09/11/2017   Carpal tunnel syndrome, right upper limb 07/01/2017   Arthritis of carpometacarpal ([place]) joint of left thumb 12/30/2016   Midfoot skin ulcer, right, limited to breakdown of skin ([place]) 07/22/2016   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   Osteomyelitis of ankle or foot, left, acute ([place]) 08/23/2014   Cellulitis 10/12/2012   Chest pain 10/12/2012   Tobacco abuse 10/12/2012   ABSCESS, AXILLA, LEFT 02/23/2008   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   CHEST WALL PAIN, ACUTE 12/03/2007   CANDIDIASIS, GLANS PENIS 09/16/2007   HEADACHE 08/04/2007   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 [place], [place], [address] Phone: [phone]   Fax:  [phone]  Name: Thomas Mabry MRN: [id number] Date of Birth: [birth year]

## 2021-06-04 NOTE — Patient Outreach (Signed)
[facility]) Care Management  06/04/2021  [name] 1940-12-06 [id number]   Outgoing call placed to niece to follow up on decision for prostate cancer, no answer.  HIPAA compliant voice message left, will follow up within the next 3-4 business days.  Valente David, [place], MSN [place] [phone]

## 2021-06-06 ENCOUNTER — Other Ambulatory Visit: Payer: Self-pay

## 2021-06-06 ENCOUNTER — Ambulatory Visit (INDEPENDENT_AMBULATORY_CARE_PROVIDER_SITE_OTHER): Payer: Medicare Other | Admitting: Physical Therapy

## 2021-06-06 ENCOUNTER — Encounter: Payer: Self-pay | Admitting: Physical Therapy

## 2021-06-06 DIAGNOSIS — R2689 Other abnormalities of gait and mobility: Secondary | ICD-10-CM

## 2021-06-06 DIAGNOSIS — R2681 Unsteadiness on feet: Secondary | ICD-10-CM

## 2021-06-06 DIAGNOSIS — M25662 Stiffness of left knee, not elsewhere classified: Secondary | ICD-10-CM | POA: Diagnosis not present

## 2021-06-06 DIAGNOSIS — M6281 Muscle weakness (generalized): Secondary | ICD-10-CM

## 2021-06-06 DIAGNOSIS — R293 Abnormal posture: Secondary | ICD-10-CM

## 2021-06-06 NOTE — Therapy (Signed)
[facility] Therapy [address] [place], [place], [address] Phone: [phone]   Fax:  [phone]  Physical Therapy Treatment  Patient Details  Name: [name] MRN: [id number] Date of Birth: [birth year] Referring Provider (PT): Meridee Score, MD   Encounter Date: 06/06/2021   PT End of Session - 06/06/21 0804     Visit Number 8    Number of Visits 25    Date for PT Re-Evaluation 08/02/21    Authorization Type UHC Medicare    Progress Note Due on Visit 10    PT Start Time 0800    PT Stop Time 0844    PT Time Calculation (min) 44 min    Equipment Utilized During Treatment Gait belt    Activity Tolerance Patient tolerated treatment well;Patient limited by fatigue    Behavior During Therapy [facility] for tasks assessed/performed             Past Medical History:  Diagnosis Date   Arthritis    Denies   Glaucoma    High cholesterol    Hypertension    denies   Osteomyelitis ([place])    Right great toe   Peripheral vascular disease ([place])    diabetic  with osteomylitis   Pre-diabetes    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    Past Surgical History:  Procedure Laterality Date   AMPUTATION  07/23/2011   Procedure: AMPUTATION DIGIT;  Surgeon: Newt Minion, MD;  Location: [place];  Service: Orthopedics;  Laterality: Right;  Right Great Toe Amputation   AMPUTATION Right 09/18/2012   Procedure: Right Foot 2nd Ray Amputation;  Surgeon: Newt Minion, MD;  Location: [place];  Service: Orthopedics;  Laterality: Right;  Right Foot 2nd Ray Amputation   AMPUTATION Right 10/14/2012   Procedure: AMPUTATION FOOT;  Surgeon: Newt Minion, MD;  Location: [place];  Service: Orthopedics;  Laterality: Right;  Right Foot Transmetatarsal Amputation   AMPUTATION Left 09/23/2014   Procedure: Left Foot Transmetatarsal Amputation;  Surgeon: Newt Minion, MD;  Location: [place];  Service: Orthopedics;  Laterality: Left;   AMPUTATION Left 11/22/2020   Procedure: LEFT BELOW KNEE AMPUTATION;   Surgeon: Newt Minion, MD;  Location: [place];  Service: Orthopedics;  Laterality: Left;   BACK SURGERY     lower   CARPAL TUNNEL RELEASE Right 01/30/2018   Procedure: RIGHT CARPAL TUNNEL RELEASE;  Surgeon: Newt Minion, MD;  Location: [place];  Service: Orthopedics;  Laterality: Right;   CIRCUMCISION  2010   COLONOSCOPY     FOOT FRACTURE SURGERY Left 1970's   "broke it playing football" (10/12/2012)   HUMERUS FRACTURE SURGERY W/ IMPLANT Right 1960's   "put a plate in it" (2/99/2426)   I & D EXTREMITY Right 03/24/2020   Procedure: EXCISION RIGHT MEDIAL CUNEFORM;  Surgeon: Newt Minion, MD;  Location: [place];  Service: Orthopedics;  Laterality: Right;   INCISION AND DRAINAGE OF WOUND Left 2006   "foot" (10/12/2012)   [place] SURGERY  2008    There were no vitals filed for this visit.   Subjective Assessment - 06/06/21 0800     Subjective He reports wearing prosthesis both days since PT.    Patient is accompained by: Family member   niece   Pertinent History arthritis, glaucoma, HTN, osteomyelitis, PVD, DM2, CVA cerebellum, right toe amputations, NSTEMI, gout    Patient Stated Goals use prosthesis to go into community including church & fishing.    Currently in Pain? No/denies  [place] Adult PT Treatment/Exercise - 06/06/21 0800       Transfers   Transfers Sit to Stand;Stand to Sit;Squat Pivot Transfers    Sit to Stand 4: Min assist;With upper extremity assist;With armrests;From chair/3-in-1;Other (comment);3: Mod assist   to RW, MinA from w/c and ModA from chair without armrests   Sit to Stand Details Verbal cues for technique;Tactile cues for weight shifting;Manual facilitation for weight shifting    Stand to Sit 4: Min guard;With upper extremity assist;With armrests;To chair/3-in-1;Other (comment);4: Min assist   from RW, min guard w/c and minA chairs without armrests.   Stand to Sit Details (indicate cue type and reason) Verbal  cues for technique;Manual facilitation for weight shifting      Ambulation/Gait   Ambulation/Gait Yes    Ambulation/Gait Assistance 4: Min assist    Ambulation/Gait Assistance Details Pt required less assist when following his neice's voice instead of PT just giving verbal cues where he is walking.    Ambulation Distance (Feet) 100 Feet   100' & 25' X 2   Assistive device Rolling walker;Prosthesis    Ramp 3: Mod assist   RW & TTA prosthesis, 2nd person for safety who helped minA for RW due to visual issues.   Ramp Details (indicate cue type and reason) verbal & manual cues on technique    Curb 3: Mod assist;4: Min assist   RW & TTA prosthesis, 2nd person for safety who helped minA for RW due to visual issues. primary PT modA descending & MinA ascending   Curb Details (indicate cue type and reason) verbal & manual cues on technique      Self-Care   Self-Care ADL's    ADL's Pt stood in front of w/c with RW support while PT donned his coat one arm at time with other support on RW with minA for balance.  Pt able to stand static with RW with only supervision while niece zipped coat      Exercises   Exercises Knee/Hip;Other Exercises      Knee/Hip Exercises: Aerobic   Nustep seat 14 level 6 with BLEs & BUEs for 8 min LLE no need for strap to maintain on pedal today. PT able to palpate muscle activity      Knee/Hip Exercises: Machines for Strengthening   Total Gym Leg Press shuttle BLEs position 3 50# 10reps 2 sets with tactile cues for motion      Prosthetics   Current prosthetic wear tolerance (days/week)  niece reports does not appear to wear prosthesis outside of PT unless she is there to help him donne it.    Current prosthetic wear tolerance (#hours/day)  reports 2hrs 2x/day    Education Provided Proper Donning;Proper wear schedule/adjustment;Other (comment)   see prosthetic care comments                      PT Short Term Goals - 06/06/21 0955       PT SHORT TERM GOAL  #1   Title Patient donnes prosthesis with cues only / no physical assist.    Time 4    Period Weeks    Status Achieved    Target Date 06/07/21      PT SHORT TERM GOAL #2   Title Patient tolerates prosthesis >8 hrs total /day without skin issues or limb pain    Time 4    Period Weeks    Status Not Met    Target Date 06/07/21  PT SHORT TERM GOAL #3   Title Patient able to transfer sit/stand std chair height with armrests & reach 10" with RW support safely.    Time 4    Period Weeks    Status Partially Met    Target Date 06/07/21      PT SHORT TERM GOAL #4   Title Patient ambulates 100' with RW & prosthesis with supervision.    Time 4    Period Weeks    Status Partially Met    Target Date 06/07/21      PT SHORT TERM GOAL #5   Title .Patient negotiates ramps & curbs with RW & prosthesis with modA.    Time 4    Period Weeks    Status Achieved    Target Date 06/07/21               PT Long Term Goals - 06/04/21 0842       PT LONG TERM GOAL #1   Title Patient & family are independent with prosthetic care and pt able to donne prosthesis modified independent.    Time 12    Period Weeks    Status On-going    Target Date 08/02/21      PT LONG TERM GOAL #2   Title Patient tolerates prosthesis wear >90% of awake hours without skin or limb pain issues.    Time 12    Period Weeks    Status On-going    Target Date 08/02/21      PT LONG TERM GOAL #3   Title Berg Balance >/= 36/56 with RW support to indicate lower fall risk    Time 12    Period Weeks    Status On-going    Target Date 08/02/21      PT LONG TERM GOAL #4   Title Patient ambulates 300' with RW & prosthesis modified independent.    Time 12    Period Weeks    Status On-going    Target Date 08/02/21      PT LONG TERM GOAL #5   Title Patient negotiates ramps, curbs with RW & stairs with single rail & prosthesis modified independent.    Time 12    Period Weeks    Status On-going    Target Date  08/02/21                   Plan - 06/06/21 0804     Clinical Impression Statement Pt ambulated better following voice instead of verbal cues due to visual issues.  He is progressing with his mobility with prosthesis but slowly due to multiple medical issues & living alone so limited practice outside of PT.    Personal Factors and Comorbidities Comorbidity 3+;Past/Current Experience;Other   lives alone   Comorbidities arthritis, glaucoma, HTN, osteomyelitis, PVD, DM2, CVA cerebellum, right transmetatarsal amputation, NSTEMI, gout    Examination-Activity Limitations Locomotion Level;Reach Overhead;Stairs;Stand;Toileting;Transfers    Examination-Participation Restrictions Community Activity    Stability/Clinical Decision Making Evolving/Moderate complexity    Rehab Potential Good    PT Frequency 2x / week    PT Duration 12 weeks    PT Treatment/Interventions ADLs/Self Care Home Management;DME Instruction;Gait training;Stair training;Functional mobility training;Therapeutic activities;Therapeutic exercise;Balance training;Neuromuscular re-education;Patient/family education;Prosthetic Training;Manual techniques;Passive range of motion;Vestibular    PT Next Visit Plan set updated STGs, continue review prosthetic care, prosthetic gait with RW, standing balance activities    Consulted and Agree with Plan of Care Patient;Family member/caregiver    Family Member Consulted neice,  Arlana Lindau             Patient will benefit from skilled therapeutic intervention in order to improve the following deficits and impairments:  Abnormal gait, Decreased activity tolerance, Decreased balance, Decreased cognition, Decreased endurance, Decreased knowledge of use of DME, Decreased mobility, Decreased range of motion, Decreased strength, Impaired flexibility, Postural dysfunction, Prosthetic Dependency  Visit Diagnosis: Unsteadiness on feet  Other abnormalities of gait and mobility  Muscle  weakness (generalized)  Stiffness of left knee, not elsewhere classified  Abnormal posture     Problem List Patient Active Problem List   Diagnosis Date Noted   Abscess of bursa of left ankle 11/22/2020   Subacute osteomyelitis, left ankle and foot ([place])    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 ([place]) 04/12/2020   Acute encephalopathy 03/20/2020   SIRS (systemic inflammatory response syndrome) ([place]) 03/20/2020   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[place]) 03/20/2020   ARF (acute renal failure) ([place]) 03/20/2020   Midfoot ulcer, left, limited to breakdown of skin ([place]) 12/29/2018   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   DM2 (diabetes mellitus, type 2) ([place]) 10/31/2018   NSTEMI (non-ST elevated myocardial infarction) ([place]) 05/28/2018   Syncope 05/28/2018   HTN (hypertension) 05/28/2018   Hyperlipemia, mixed 05/28/2018   Rash and nonspecific skin eruption 12/16/2017   Pain in right hand 09/11/2017   Carpal tunnel syndrome, right upper limb 07/01/2017   Arthritis of carpometacarpal [facility]) joint of left thumb 12/30/2016   Midfoot skin ulcer, right, limited to breakdown of skin ([place]) 07/22/2016   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[place]) 09/23/2014   Osteomyelitis of ankle or foot, left, acute ([place]) 08/23/2014   Cellulitis 10/12/2012   Chest pain 10/12/2012   Tobacco abuse 10/12/2012   ABSCESS, AXILLA, LEFT 02/23/2008   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   CHEST WALL PAIN, ACUTE 12/03/2007   CANDIDIASIS, GLANS PENIS 09/16/2007   HEADACHE 08/04/2007   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

## 2021-06-07 ENCOUNTER — Other Ambulatory Visit: Payer: Self-pay | Admitting: *Deleted

## 2021-06-07 NOTE — Patient Outreach (Signed)
[facility]) Care Management  06/07/2021  Dandre Sisler 12-May-1941 [id number]   Outreach attempt #2 to niece to follow up on prostate cancer diagnosis, no answer, HIPAA compliant voice message left.  Will send outreach letter and follow up within the next 3-4 business days.  Valente David, RN, MSN, [place] Manager [phone]

## 2021-06-12 ENCOUNTER — Encounter: Payer: Self-pay | Admitting: Physical Therapy

## 2021-06-12 ENCOUNTER — Other Ambulatory Visit: Payer: Self-pay

## 2021-06-12 ENCOUNTER — Ambulatory Visit (INDEPENDENT_AMBULATORY_CARE_PROVIDER_SITE_OTHER): Payer: Medicare Other | Admitting: Physical Therapy

## 2021-06-12 DIAGNOSIS — M6281 Muscle weakness (generalized): Secondary | ICD-10-CM | POA: Diagnosis not present

## 2021-06-12 DIAGNOSIS — M25662 Stiffness of left knee, not elsewhere classified: Secondary | ICD-10-CM | POA: Diagnosis not present

## 2021-06-12 DIAGNOSIS — R293 Abnormal posture: Secondary | ICD-10-CM | POA: Diagnosis not present

## 2021-06-12 DIAGNOSIS — R2689 Other abnormalities of gait and mobility: Secondary | ICD-10-CM

## 2021-06-12 DIAGNOSIS — R2681 Unsteadiness on feet: Secondary | ICD-10-CM

## 2021-06-12 NOTE — Therapy (Signed)
[facility] [address] [place], [place], [address] Phone: [phone]   Fax:  [phone]  Physical Therapy Treatment  Patient Details  Name: [name] MRN: [id number] Date of Birth: [birth year] Referring Provider (PT): Meridee Score, MD   Encounter Date: 06/12/2021   PT End of Session - 06/12/21 0803     Visit Number 9    Number of Visits 25    Date for PT Re-Evaluation 08/02/21    Authorization Type UHC Medicare    Progress Note Due on Visit 10    PT Start Time 0800    PT Stop Time 0844    PT Time Calculation (min) 44 min    Equipment Utilized During Treatment Gait belt    Activity Tolerance Patient tolerated treatment well;Patient limited by fatigue    Behavior During Therapy [facility] for tasks assessed/performed             Past Medical History:  Diagnosis Date   Arthritis    Denies   Glaucoma    High cholesterol    Hypertension    denies   Osteomyelitis ([place])    Right great toe   Peripheral vascular disease ([place])    diabetic  with osteomylitis   Pre-diabetes    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    Past Surgical History:  Procedure Laterality Date   AMPUTATION  07/23/2011   Procedure: AMPUTATION DIGIT;  Surgeon: Newt Minion, MD;  Location: [place];  Service: Orthopedics;  Laterality: Right;  Right Great Toe Amputation   AMPUTATION Right 09/18/2012   Procedure: Right Foot 2nd Ray Amputation;  Surgeon: Newt Minion, MD;  Location: [place];  Service: Orthopedics;  Laterality: Right;  Right Foot 2nd Ray Amputation   AMPUTATION Right 10/14/2012   Procedure: AMPUTATION FOOT;  Surgeon: Newt Minion, MD;  Location: [place];  Service: Orthopedics;  Laterality: Right;  Right Foot Transmetatarsal Amputation   AMPUTATION Left 09/23/2014   Procedure: Left Foot Transmetatarsal Amputation;  Surgeon: Newt Minion, MD;  Location: [place];  Service: Orthopedics;  Laterality: Left;   AMPUTATION Left 11/22/2020   Procedure: LEFT BELOW KNEE AMPUTATION;   Surgeon: Newt Minion, MD;  Location: [facility];  Service: Orthopedics;  Laterality: Left;   BACK SURGERY     lower   CARPAL TUNNEL RELEASE Right 01/30/2018   Procedure: RIGHT CARPAL TUNNEL RELEASE;  Surgeon: Newt Minion, MD;  Location: [place] [facility];  Service: Orthopedics;  Laterality: Right;   CIRCUMCISION  2010   COLONOSCOPY     FOOT FRACTURE SURGERY Left 1970's   "broke it playing football" (10/12/2012)   HUMERUS FRACTURE SURGERY W/ IMPLANT Right 1960's   "put a plate in it" (12/08/7626)   I & D EXTREMITY Right 03/24/2020   Procedure: EXCISION RIGHT MEDIAL CUNEFORM;  Surgeon: Newt Minion, MD;  Location: [facility];  Service: Orthopedics;  Laterality: Right;   INCISION AND DRAINAGE OF WOUND Left 2006   "foot" (10/12/2012)   [place] SURGERY  2008    There were no vitals filed for this visit.   Subjective Assessment - 06/12/21 0804     Subjective He wore prosthesis some over the holiday weekend.  His niece was able to get a chair with armrests for his home.    Patient is accompained by: Family member   niece   Pertinent History arthritis, glaucoma, HTN, osteomyelitis, PVD, DM2, CVA cerebellum, right toe amputations, NSTEMI, gout    Patient Stated Goals use prosthesis to go  into community including church & fishing.    Currently in Pain? No/denies                               [facility] Adult PT Treatment/Exercise - 06/12/21 0800       Transfers   Transfers Sit to Stand;Stand to Sit;Squat Pivot Transfers    Sit to Stand 4: Min assist;With upper extremity assist;With armrests;From chair/3-in-1;Other (comment);3: Mod assist   to RW, MinA from w/c and ModA from chair without armrests   Sit to Stand Details Verbal cues for technique;Tactile cues for weight shifting;Manual facilitation for weight shifting    Stand to Sit 4: Min guard;With upper extremity assist;With armrests;To chair/3-in-1;Other (comment);4: Min assist   from RW, min guard w/c and minA chairs without armrests.    Stand to Sit Details (indicate cue type and reason) Verbal cues for technique;Manual facilitation for weight shifting    Comments PT demo & instructed niece how & when to direct him to turn himself in front of chair to sit.      Ambulation/Gait   Ambulation/Gait Yes    Ambulation/Gait Assistance 4: Min assist    Ambulation/Gait Assistance Details PT tactile & verbal cues on upright posture, RW position and wt shift over prosthesis in stance.  PT demo & instructed niece in proper ht of RW.  She reports that he has rollator style at home which PT feels would be too unstable for him to use currently.    Ambulation Distance (Feet) 100 Feet   100' & 25' X 2   Assistive device Rolling walker;Prosthesis    Ramp 3: Mod assist   RW & TTA prosthesis, 2nd person for safety who helped minA for RW due to visual issues.   Ramp Details (indicate cue type and reason) PT demo & verbal cues with niece how to assist and during activity with patient on ramp. She verbalized understanding.    Curb 3: Mod assist   RW & TTA prosthesis   Curb Details (indicate cue type and reason) PT demo & verbal cues with niece how to assist and during activity with patient on curb. She verbalized understanding.      Self-Care   Self-Care ADL's    ADL's Pt stood in front of w/c with RW support while PT donned his coat one arm at time with other support on RW with supervision for balance.      Exercises   Exercises Knee/Hip;Other Exercises      Knee/Hip Exercises: Aerobic   Nustep seat 14 level 6 with BLEs & BUEs for 8 min LLE no need for strap to maintain on pedal today. PT able to palpate muscle activity      Knee/Hip Exercises: Machines for Strengthening   Total Gym Leg Press shuttle BLEs position 3 with 50# 10reps 3 sets with tactile cues for motion      Prosthetics   Current prosthetic wear tolerance (#hours/day)  reports 2hrs 2x/day    Education Provided Proper Donning;Proper wear schedule/adjustment;Other (comment)    see prosthetic care comments                      PT Short Term Goals - 06/12/21 1236       PT SHORT TERM GOAL #1   Title Patient donnes prosthesis without cues or physical assist.    Time 4    Period Weeks    Status Revised  Target Date 07/05/21      PT SHORT TERM GOAL #2   Title Patient tolerates prosthesis >8 hrs total /day without skin issues or limb pain    Time 4    Period Weeks    Status On-going    Target Date 07/05/21      PT SHORT TERM GOAL #3   Title Patient able to transfer sit/stand std chair height without armrests using UEs to push to RW safely.    Time 4    Period Weeks    Status Revised    Target Date 07/05/21      PT SHORT TERM GOAL #4   Title Patient ambulates 200' with RW & prosthesis with supervision.    Time 4    Period Weeks    Status Revised    Target Date 07/05/21      PT SHORT TERM GOAL #5   Title .Patient negotiates ramps & curbs with RW & prosthesis with minA from niece with PT giving cues    Time 4    Period Weeks    Status Revised    Target Date 07/05/21               PT Long Term Goals - 06/04/21 0842       PT LONG TERM GOAL #1   Title Patient & family are independent with prosthetic care and pt able to donne prosthesis modified independent.    Time 12    Period Weeks    Status On-going    Target Date 08/02/21      PT LONG TERM GOAL #2   Title Patient tolerates prosthesis wear >90% of awake hours without skin or limb pain issues.    Time 12    Period Weeks    Status On-going    Target Date 08/02/21      PT LONG TERM GOAL #3   Title Berg Balance >/= 36/56 with RW support to indicate lower fall risk    Time 12    Period Weeks    Status On-going    Target Date 08/02/21      PT LONG TERM GOAL #4   Title Patient ambulates 300' with RW & prosthesis modified independent.    Time 12    Period Weeks    Status On-going    Target Date 08/02/21      PT LONG TERM GOAL #5   Title Patient negotiates ramps,  curbs with RW & stairs with single rail & prosthesis modified independent.    Time 12    Period Weeks    Status On-going    Target Date 08/02/21                   Plan - 06/12/21 0803     Clinical Impression Statement PT instructed niece in how to assist him on ramps & curbs.  She has basic understanding but is not ready to assist yet.  She reports that his walker at home has seat so is probably a rollator walker which would not be safe with his current balance.    Personal Factors and Comorbidities Comorbidity 3+;Past/Current Experience;Other   lives alone   Comorbidities arthritis, glaucoma, HTN, osteomyelitis, PVD, DM2, CVA cerebellum, right transmetatarsal amputation, NSTEMI, gout    Examination-Activity Limitations Locomotion Level;Reach Overhead;Stairs;Stand;Toileting;Transfers    Examination-Participation Restrictions Community Activity    Stability/Clinical Decision Making Evolving/Moderate complexity    Rehab Potential Good    PT Frequency 2x / week  PT Duration 12 weeks    PT Treatment/Interventions ADLs/Self Care Home Management;DME Instruction;Gait training;Stair training;Functional mobility training;Therapeutic activities;Therapeutic exercise;Balance training;Neuromuscular re-education;Patient/family education;Prosthetic Training;Manual techniques;Passive range of motion;Vestibular    PT Next Visit Plan Do 10th visit progress note,  work towards updated STGs, continue review prosthetic care, prosthetic gait with RW, standing balance activities    Consulted and Agree with Plan of Care Patient;Family member/caregiver    Family Member Consulted neice, Arlana Lindau             Patient will benefit from skilled therapeutic intervention in order to improve the following deficits and impairments:  Abnormal gait, Decreased activity tolerance, Decreased balance, Decreased cognition, Decreased endurance, Decreased knowledge of use of DME, Decreased mobility, Decreased  range of motion, Decreased strength, Impaired flexibility, Postural dysfunction, Prosthetic Dependency  Visit Diagnosis: Unsteadiness on feet  Other abnormalities of gait and mobility  Muscle weakness (generalized)  Stiffness of left knee, not elsewhere classified  Abnormal posture     Problem List Patient Active Problem List   Diagnosis Date Noted   Abscess of bursa of left ankle 11/22/2020   Subacute osteomyelitis, left ankle and foot ([place])    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 ([place]) 04/12/2020   Acute encephalopathy 03/20/2020   SIRS (systemic inflammatory response syndrome) ([place]) 03/20/2020   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[place]) 03/20/2020   ARF (acute renal failure) ([place]) 03/20/2020   Midfoot ulcer, left, limited to breakdown of skin ([place]) 12/29/2018   Occipital stroke ([place]) 10/31/2018   DM2 (diabetes mellitus, type 2) ([place]) 10/31/2018   NSTEMI (non-ST elevated myocardial infarction) ([place]) 05/28/2018   Syncope 05/28/2018   HTN (hypertension) 05/28/2018   Hyperlipemia, mixed 05/28/2018   Rash and nonspecific skin eruption 12/16/2017   Pain in right hand 09/11/2017   Carpal tunnel syndrome, right upper limb 07/01/2017   Arthritis of carpometacarpal ([place]) joint of left thumb 12/30/2016   Midfoot skin ulcer, right, limited to breakdown of skin ([place]) 07/22/2016   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[place]) 09/23/2014   Osteomyelitis of ankle or foot, left, acute ([place]) 08/23/2014   Cellulitis 10/12/2012   Chest pain 10/12/2012   Tobacco abuse 10/12/2012   ABSCESS, AXILLA, LEFT 02/23/2008   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   CHEST WALL PAIN, ACUTE 12/03/2007   CANDIDIASIS, GLANS PENIS 09/16/2007   HEADACHE 08/04/2007   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

## 2021-06-13 ENCOUNTER — Other Ambulatory Visit: Payer: Self-pay | Admitting: *Deleted

## 2021-06-13 NOTE — Patient Outreach (Signed)
[name]) Care Management  [place]  06/13/2021   [name] [birth year] [id number]   Outreach attempt #3 to niece, successful.  Denies any urgent concerns, encouraged to contact this care manager with questions.   Encounter Medications:  Outpatient Encounter Medications as of 06/13/2021  Medication Sig Note   atorvastatin (LIPITOR) 80 MG tablet Take 80 mg by mouth daily.    brimonidine (ALPHAGAN) 0.2 % ophthalmic solution Place 1 drop into the right eye 2 (two) times daily.    clopidogrel (PLAVIX) 75 MG tablet Take 1 tablet (75 mg total) by mouth daily. 11/22/2020: Not clear when he took last.  Per Jan, RN note may not be taking his medications as ordered.   cycloSPORINE (RESTASIS) 0.05 % ophthalmic emulsion Place 1 drop into both eyes 2 (two) times daily.    Dorzolamide HCl-Timolol Mal PF 2-0.5 % SOLN Place 1 drop into both eyes 2 (two) times daily.    dorzolamidel-timolol (COSOPT) 22.3-6.8 MG/ML SOLN ophthalmic solution Place 1 drop into the right eye 2 (two) times daily.    gabapentin (NEURONTIN) 300 MG capsule Take 1 capsule (300 mg total) by mouth at bedtime.    hydroxypropyl methylcellulose / hypromellose (ISOPTO TEARS / GONIOVISC) 2.5 % ophthalmic solution Place 1 drop into both eyes 3 (three) times daily as needed for dry eyes.    ketorolac (ACULAR) 0.5 % ophthalmic solution Place 1 drop into both eyes daily.    oxyCODONE-acetaminophen (PERCOCET/ROXICET) 5-325 MG tablet Take 1 tablet by mouth every 6 (six) hours as needed for severe pain.    No facility-administered encounter medications on file as of 06/13/2021.    Functional Status:  In your present state of health, do you have any difficulty performing the following activities: 05/21/2021 11/22/2020  Hearing? N -  Vision? N -  Difficulty concentrating or making decisions? N -  Walking or climbing stairs? Y -  Comment Non-ambulatory -  Dressing or bathing? N -  Doing errands, shopping? Y N   Comment Non-ambulatory -  Conservation officer, nature and eating ? N -  Using the Toilet? N -  In the past six months, have you accidently leaked urine? Y -  Comment Occasional Urinary Incontinence -  Do you have problems with loss of bowel control? N -  Managing your Medications? Y -  Comment Niece - [name] Assists -  Managing your Finances? Y -  Comment Niece - [name] Assists -  Housekeeping or managing your Housekeeping? Y -  Comment Niece - [name] Assists -  Some recent data might be hidden    Fall/Depression Screening: Fall Risk  05/21/2021 11/24/2020 11/11/2018  Falls in the past year? 1 0 0  Number falls in past yr: 0 0 0  Injury with Fall? 1 0 0  Risk for fall due to : History of fall(s);Impaired balance/gait;Impaired mobility Impaired balance/gait;Orthopedic patient -  Follow up Falls evaluation completed;Education provided;Falls prevention discussed - Falls evaluation completed   PHQ 2/9 Scores 05/21/2021 11/24/2020 09/06/2014 08/23/2014 08/23/2014 08/23/2014  PHQ - 2 Score 0 0 0 0 0 0  Exception Documentation Other- indicate reason in comment box - - - - -  Not completed Verified by Niece - [name] - - - - -    Assessment:   Care Plan Care Plan : General Plan of Care (Adult)  Updates made by Valente David, RN since 06/13/2021 12:00 AM     Problem: Risk for hospital admission related to noncompliance and knowledge  defecit regarding management of medical conditions   Priority: High     Long-Range Goal: Patient will show appropriate management of chronic medical conditions evidenced by no hospital admissions in the next 6 months   Start Date: 04/05/2021  Expected End Date: 10/02/2021  This Visit's Progress: On track  Recent Progress: On track  Priority: High  Note:   Current Barriers:  Knowledge Deficits related to plan of care for management of DMII and osteomyelitis with amputation Chronic Disease Management support and education needs related to DMII and  osteomyelitis with amputation Non-adherence to prescribed medication regimen  RNCM Clinical Goal(s):  Patient will verbalize understanding of plan for management of DMII and BKA take all medications exactly as prescribed and will call provider for medication related questions attend all scheduled medical appointments: Dr. Sharol Given work with Home health PT to increase strength and learn mobility on new prosthetic not experience hospital admission. Hospital Admissions in last 6 months = 1  through collaboration with RN Care manager, provider, and care team.   Interventions: 1:1 collaboration with primary care provider regarding development and update of comprehensive plan of care as evidenced by provider attestation and co-signature Inter-disciplinary care team collaboration (see longitudinal plan of care) Evaluation of current treatment plan related to  self management and patient's adherence to plan as established by provider  Amputation with new prosthesisGoal on track:  Yes Evaluation of current treatment plan related to DMII and BKA , ADL IADL limitations and Inability to perform ADL's independently self-management and patient's adherence to plan as established by provider. Discussed plans with patient for ongoing care management follow up and provided patient with direct contact information for care management team Reviewed medications with patient and discussed importance of taking in effort to decrease risk of admission; Collaborated with Dr. Sharol Given and [name] regarding HHPT; Reviewed scheduled/upcoming provider appointments including; Screening for signs and symptoms of depression related to chronic disease state;   Patient Goals/Self-Care Activities: Patient will self administer medications as prescribed Patient will attend all scheduled provider appointments Patient will continue to perform ADL's independently Patient will continue to perform IADL's independently  Follow Up Plan:   Telephone follow up appointment with care management team member scheduled for:  12/19 The patient has been provided with contact information for the care management team and has been advised to call with any health related questions or concerns.    Update 11/18 - Per niece, member still has not started PT for new prosthetic leg.  There was a request to have in home PT as member has not been able to get himself up, dressed, and out of the door to a transport vehicle independently, however family was notified that home PT is not suitable for prosthesis.  Member has appointment scheduled for next week to have evaluation for outpatient PT but there is still the barrier of member not having anyone in the home to help get him ready for visits.  He has continued to decline offers for ALF or adult day programs such as [facility], but per niece, he is willing to have personal care aide come into the home.  She state he now has Medicaid, agrees to have referral to CSW for assistance.    She also report that member was seen by urology and told that there is concern for prostate cancer, will need a biopsy.  She has been trying to get appointment scheduled however she was notified that they no longer serve Medicaid patients.  She will contact a  different urology office within the next week to schedule office visit and biopsy.     Update 11/30 - Niece report member has been able to start outpatient PT sessions, she is still working with him on compliance issues.  Advised that CSW would be reaching out to her to discuss process for PCS.  Notified that member does not have appointment with urology office, will have appointment and biopsy on Monday 12/5.  Update 12/7 - Spoke with niece, confirms member had biopsy on Monday and received results today, positive for cancer.  He has follow upon 12/16 to discuss treatment options, she is not confident that he will agree to treatment as he has said in the past that he does not want  to see any more doctors.  She will encourage him to listen to options and make final decision.  Meanwhile, she and her sister will both work to help get member PCS started for additional help in the home.   Update 12/28 - Per niece, member has received his first "shot" for prostate cancer, she was unable to verbalize what it actually was.  State they are waiting on a call from an assigned oncologist to develop treatment plan. She remains interested in having member receive PCS, aware that CSW will make contact with her today regarding progress of application.  Overall, state member is doing better, doing to PT for prosthetic therapy twice a week, appetite has increased.            Plan:  Follow-up: Patient agrees to Care Plan and Follow-up. Follow-up in 1 month(s).  Valente David, RN, MSN, [place] Manager [phone]

## 2021-06-13 NOTE — Patient Outreach (Signed)
[place] [facility]) Care Management  06/13/2021  Lavoy Bernards 05-15-1941 [id number]   CSW spoke with pt's niece today by phone- she reports she applied online for Medicaid today- can take up to 45 days for determination. Meanwhile, she thinks she has someone who can help out at home some as pt lives alone and is wheelchair bound and blind- [name] niece takes him to PT 2 twice a week.   CSW will plan follow up call in 6 weeks- advised niece to call with updates or further needs prior to that if needed.   Eduard Clos, MSW, [place] Worker  [place]

## 2021-06-14 ENCOUNTER — Other Ambulatory Visit: Payer: Self-pay

## 2021-06-14 ENCOUNTER — Encounter: Payer: Self-pay | Admitting: Physical Therapy

## 2021-06-14 ENCOUNTER — Ambulatory Visit (INDEPENDENT_AMBULATORY_CARE_PROVIDER_SITE_OTHER): Payer: Medicare Other | Admitting: Physical Therapy

## 2021-06-14 DIAGNOSIS — M6281 Muscle weakness (generalized): Secondary | ICD-10-CM | POA: Diagnosis not present

## 2021-06-14 DIAGNOSIS — M25662 Stiffness of left knee, not elsewhere classified: Secondary | ICD-10-CM | POA: Diagnosis not present

## 2021-06-14 DIAGNOSIS — R293 Abnormal posture: Secondary | ICD-10-CM

## 2021-06-14 DIAGNOSIS — R2689 Other abnormalities of gait and mobility: Secondary | ICD-10-CM

## 2021-06-14 DIAGNOSIS — R2681 Unsteadiness on feet: Secondary | ICD-10-CM | POA: Diagnosis not present

## 2021-06-14 NOTE — Therapy (Signed)
[facility] [address] [place], [place], [address] Phone: [phone]   Fax:  [phone]  Physical Therapy Treatment & 10th Visit Progress Note  Patient Details  Name: [name] MRN: [id number] Date of Birth: [birth year] Referring Provider (PT): Meridee Score, MD   Encounter Date: 06/14/2021  Progress Note Reporting Period 05/09/2021 to 06/14/2021  See note below for Objective Data and Assessment of Progress/Goals.       PT End of Session - 06/14/21 0759     Visit Number 10    Number of Visits 25    Date for PT Re-Evaluation 08/02/21    Authorization Type [employer] Medicare    Progress Note Due on Visit 20    PT Start Time 0759    PT Stop Time 0844    PT Time Calculation (min) 45 min    Equipment Utilized During Treatment Gait belt    Activity Tolerance Patient tolerated treatment well;Patient limited by fatigue    Behavior During Therapy WFL for tasks assessed/performed             Past Medical History:  Diagnosis Date   Arthritis    Denies   Glaucoma    High cholesterol    Hypertension    denies   Osteomyelitis ([place])    Right great toe   Peripheral vascular disease ([place])    diabetic  with osteomylitis   Pre-diabetes    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    Past Surgical History:  Procedure Laterality Date   AMPUTATION  07/23/2011   Procedure: AMPUTATION DIGIT;  Surgeon: Newt Minion, MD;  Location: [place];  Service: Orthopedics;  Laterality: Right;  Right Great Toe Amputation   AMPUTATION Right 09/18/2012   Procedure: Right Foot 2nd Ray Amputation;  Surgeon: Newt Minion, MD;  Location: [place];  Service: Orthopedics;  Laterality: Right;  Right Foot 2nd Ray Amputation   AMPUTATION Right 10/14/2012   Procedure: AMPUTATION FOOT;  Surgeon: Newt Minion, MD;  Location: [place];  Service: Orthopedics;  Laterality: Right;  Right Foot Transmetatarsal Amputation   AMPUTATION Left 09/23/2014   Procedure: Left Foot Transmetatarsal  Amputation;  Surgeon: Newt Minion, MD;  Location: [place];  Service: Orthopedics;  Laterality: Left;   AMPUTATION Left 11/22/2020   Procedure: LEFT BELOW KNEE AMPUTATION;  Surgeon: Newt Minion, MD;  Location: [place];  Service: Orthopedics;  Laterality: Left;   BACK SURGERY     lower   CARPAL TUNNEL RELEASE Right 01/30/2018   Procedure: RIGHT CARPAL TUNNEL RELEASE;  Surgeon: Newt Minion, MD;  Location: [place];  Service: Orthopedics;  Laterality: Right;   CIRCUMCISION  2010   COLONOSCOPY     FOOT FRACTURE SURGERY Left 1970's   "broke it playing football" (10/12/2012)   HUMERUS FRACTURE SURGERY W/ IMPLANT Right 1960's   "put a plate in it" (10/03/6220)   I & D EXTREMITY Right 03/24/2020   Procedure: EXCISION RIGHT MEDIAL CUNEFORM;  Surgeon: Newt Minion, MD;  Location: [place];  Service: Orthopedics;  Laterality: Right;   INCISION AND DRAINAGE OF WOUND Left 2006   "foot" (10/12/2012)   [place] SURGERY  2008    There were no vitals filed for this visit.   Subjective Assessment - 06/14/21 0759     Subjective His neice put new chair with armrests in his home.  She walked from w/c to chair with neice holding.    Patient is accompained by: Family member   niece  Pertinent History arthritis, glaucoma, HTN, osteomyelitis, PVD, DM2, CVA cerebellum, right toe amputations, NSTEMI, gout    Patient Stated Goals use prosthesis to go into community including church & fishing.    Currently in Pain? No/denies                               [facility] Adult PT Treatment/Exercise - 06/14/21 0759       Transfers   Transfers Sit to Stand;Stand to Sit;Stand Pivot Transfers    Sit to Stand 4: Min assist;With upper extremity assist;With armrests;From chair/3-in-1;Other (comment);3: Mod assist   to locked rollator RW, MinA from w/c and ModA from chair without armrests   Sit to Stand Details Verbal cues for technique;Tactile cues for weight shifting;Manual facilitation for weight  shifting;Verbal cues for safe use of DME/AE;Visual cues for safe use of DME/AE    Stand to Sit 4: Min guard;With upper extremity assist;With armrests;To chair/3-in-1;Other (comment);4: Min assist   from locked rollator RW, min guard w/c and minA chairs without armrests.   Stand to Sit Details (indicate cue type and reason) Verbal cues for technique;Manual facilitation for weight shifting;Verbal cues for safe use of DME/AE;Visual cues for safe use of DME/AE    Stand Pivot Transfer Details (indicate cue type and reason) PT demo to neice stand pivot transfers to rollator seat. She verbalized understanding.  PT believes he will only need to sit on rollator when out with family if he fatigues.    Squat Pivot Transfers 5: Supervision   over armrests with prosthesis on limb to assist   Comments --      Ambulation/Gait   Ambulation/Gait Yes    Ambulation/Gait Assistance 4: Min assist    Ambulation/Gait Assistance Details PT instructed with demo, verbal & manual/tactile cues on rollator walker safety, brake use & his position to walker.  PT had to manually hold rollator walker LUE & assist on belt with RUE with this walker as moves more easily.  His niece safely assisted last 25' walk with PT supervising    Ambulation Distance (Feet) 70 Feet   70', 30', 25' & 15'   Assistive device Rolling walker;Prosthesis;Rollator    Ramp 3: Mod assist   locked rollator RW & TTA prosthesis   Ramp Details (indicate cue type and reason) PT demo & verbal cues to neice prior with change in technique with rollator walker.  Pt performed with verbal & manual cues.    Curb 3: Mod assist   locked rollator RW & TTA prosthesis   Curb Details (indicate cue type and reason) PT demo & verbal cues to neice prior with change in technique with rollator walker.  Pt performed with verbal & manual cues.      Self-Care   Self-Care ADL's    ADL's --      Exercises   Exercises Knee/Hip;Other Exercises      Knee/Hip Exercises: Aerobic    Nustep seat 14 level 7 with BLEs & BUEs for 8 min LLE no need for strap to maintain on pedal today. PT able to palpate muscle activity      Knee/Hip Exercises: Machines for Strengthening   Total Gym Leg Press shuttle BLEs position 3 with 56# 15reps 3 sets with tactile cues for motion      Prosthetics   Prosthetic Care Comments  PT recommended increasing wear to most of awake hours so he has two feet to assist when he wants to  move.    Current prosthetic wear tolerance (#hours/day)  reports 4-5 hrs 1x/day    Education Provided Proper wear schedule/adjustment;Other (comment)   see prosthetic care comments   Person(s) Educated Patient;Caregiver(s)    Education Method Explanation;Verbal cues    Education Method Verbalized understanding;Verbal cues required;Needs further [place] Prosthesis Supervision                       PT Short Term Goals - 06/12/21 1236       PT SHORT TERM GOAL #1   Title Patient donnes prosthesis without cues or physical assist.    Time 4    Period Weeks    Status Revised    Target Date 07/05/21      PT SHORT TERM GOAL #2   Title Patient tolerates prosthesis >8 hrs total /day without skin issues or limb pain    Time 4    Period Weeks    Status On-going    Target Date 07/05/21      PT SHORT TERM GOAL #3   Title Patient able to transfer sit/stand std chair height without armrests using UEs to push to RW safely.    Time 4    Period Weeks    Status Revised    Target Date 07/05/21      PT SHORT TERM GOAL #4   Title Patient ambulates 200' with RW & prosthesis with supervision.    Time 4    Period Weeks    Status Revised    Target Date 07/05/21      PT SHORT TERM GOAL #5   Title .Patient negotiates ramps & curbs with RW & prosthesis with minA from niece with PT giving cues    Time 4    Period Weeks    Status Revised    Target Date 07/05/21               PT Long Term Goals - 06/04/21 0842       PT LONG TERM GOAL #1    Title Patient & family are independent with prosthetic care and pt able to donne prosthesis modified independent.    Time 12    Period Weeks    Status On-going    Target Date 08/02/21      PT LONG TERM GOAL #2   Title Patient tolerates prosthesis wear >90% of awake hours without skin or limb pain issues.    Time 12    Period Weeks    Status On-going    Target Date 08/02/21      PT LONG TERM GOAL #3   Title Berg Balance >/= 36/56 with RW support to indicate lower fall risk    Time 12    Period Weeks    Status On-going    Target Date 08/02/21      PT LONG TERM GOAL #4   Title Patient ambulates 300' with RW & prosthesis modified independent.    Time 12    Period Weeks    Status On-going    Target Date 08/02/21      PT LONG TERM GOAL #5   Title Patient negotiates ramps, curbs with RW & stairs with single rail & prosthesis modified independent.    Time 12    Period Weeks    Status On-going    Target Date 08/02/21                   Plan -  06/14/21 0759     Clinical Impression Statement Patient is making excellent progress with his mobility with his Transtibial prosthesis.  His visual deficits and living alone with limited practice options has slowed his progress.  He continues to benefit from skilled PT to improve his function & safety.  His niece brought his rollator walker to PT today.  He has potential to use this style walker for community outings with family but he will need a standard rolling walker tall adult for mobility in his home.    Personal Factors and Comorbidities Comorbidity 3+;Past/Current Experience;Other   lives alone   Comorbidities arthritis, glaucoma, HTN, osteomyelitis, PVD, DM2, CVA cerebellum, right transmetatarsal amputation, NSTEMI, gout    Examination-Activity Limitations Locomotion Level;Reach Overhead;Stairs;Stand;Toileting;Transfers    Examination-Participation Restrictions Community Activity    Stability/Clinical Decision Making  Evolving/Moderate complexity    Rehab Potential Good    PT Frequency 2x / week    PT Duration 12 weeks    PT Treatment/Interventions ADLs/Self Care Home Management;DME Instruction;Gait training;Stair training;Functional mobility training;Therapeutic activities;Therapeutic exercise;Balance training;Neuromuscular re-education;Patient/family education;Prosthetic Training;Manual techniques;Passive range of motion;Vestibular    PT Next Visit Plan work towards updated STGs, continue review prosthetic care, prosthetic gait with RW, standing balance activities, discuss rollator vs std RW with pt & niece    Consulted and Agree with Plan of Care Patient;Family member/caregiver    Family Member Consulted neice, Arlana Lindau             Patient will benefit from skilled therapeutic intervention in order to improve the following deficits and impairments:  Abnormal gait, Decreased activity tolerance, Decreased balance, Decreased cognition, Decreased endurance, Decreased knowledge of use of DME, Decreased mobility, Decreased range of motion, Decreased strength, Impaired flexibility, Postural dysfunction, Prosthetic Dependency  Visit Diagnosis: Unsteadiness on feet  Other abnormalities of gait and mobility  Muscle weakness (generalized)  Stiffness of left knee, not elsewhere classified  Abnormal posture     Problem List Patient Active Problem List   Diagnosis Date Noted   Abscess of bursa of left ankle 11/22/2020   Subacute osteomyelitis, left ankle and foot ([place])    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 ([place]) 04/12/2020   Acute encephalopathy 03/20/2020   SIRS (systemic inflammatory response syndrome) ([place]) 03/20/2020   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[place]) 03/20/2020   ARF (acute renal failure) ([place]) 03/20/2020   Midfoot ulcer, left, limited to breakdown of skin ([place]) 12/29/2018   Occipital stroke ([place]) 10/31/2018   DM2 (diabetes mellitus, type 2) ([place]) 10/31/2018   NSTEMI  (non-ST elevated myocardial infarction) ([place]) 05/28/2018   Syncope 05/28/2018   HTN (hypertension) 05/28/2018   Hyperlipemia, mixed 05/28/2018   Rash and nonspecific skin eruption 12/16/2017   Pain in right hand 09/11/2017   Carpal tunnel syndrome, right upper limb 07/01/2017   Arthritis of carpometacarpal ([place]) joint of left thumb 12/30/2016   Midfoot skin ulcer, right, limited to breakdown of skin ([place]) 07/22/2016   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/23/2014   Osteomyelitis of ankle or foot, left, acute ([place]) 08/23/2014   Cellulitis 10/12/2012   Chest pain 10/12/2012   Tobacco abuse 10/12/2012   ABSCESS, AXILLA, LEFT 02/23/2008   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   CHEST WALL PAIN, ACUTE 12/03/2007   CANDIDIASIS, GLANS PENIS 09/16/2007   HEADACHE 08/04/2007   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 [place], [place], [address] Phone: [phone]   Fax:  [phone]  Name: [name] MRN: [id number] Date of Birth: Jan 22, 1941

## 2021-06-15 ENCOUNTER — Telehealth: Payer: Self-pay | Admitting: Oncology

## 2021-06-15 NOTE — Telephone Encounter (Signed)
Scheduled appt per 12/22 referral. [name] niece is aware of appt date and time. She is also aware pt needs to be here 15 mins prior to appt time.

## 2021-06-19 ENCOUNTER — Ambulatory Visit (INDEPENDENT_AMBULATORY_CARE_PROVIDER_SITE_OTHER): Payer: Medicare Other | Admitting: Physical Therapy

## 2021-06-19 ENCOUNTER — Other Ambulatory Visit: Payer: Self-pay

## 2021-06-19 DIAGNOSIS — M6281 Muscle weakness (generalized): Secondary | ICD-10-CM | POA: Diagnosis not present

## 2021-06-19 DIAGNOSIS — R2681 Unsteadiness on feet: Secondary | ICD-10-CM | POA: Diagnosis not present

## 2021-06-19 DIAGNOSIS — R2689 Other abnormalities of gait and mobility: Secondary | ICD-10-CM

## 2021-06-19 DIAGNOSIS — R293 Abnormal posture: Secondary | ICD-10-CM | POA: Diagnosis not present

## 2021-06-19 DIAGNOSIS — M25662 Stiffness of left knee, not elsewhere classified: Secondary | ICD-10-CM | POA: Diagnosis not present

## 2021-06-19 NOTE — Therapy (Signed)
[facility] [address] [place], [place], [address] Phone: [phone]   Fax:  [phone]  Physical Therapy Treatment  Patient Details  Name: [name] MRN: [id number] Date of Birth: 1940-07-22 Referring Provider (PT): Meridee Score, MD   Encounter Date: 06/19/2021   PT End of Session - 06/19/21 0823     Visit Number 11    Number of Visits 25    Date for PT Re-Evaluation 08/02/21    Authorization Type UHC Medicare    Progress Note Due on Visit 56    PT Start Time 0804    PT Stop Time 0845    PT Time Calculation (min) 41 min    Equipment Utilized During Treatment Gait belt    Activity Tolerance Patient tolerated treatment well;Patient limited by fatigue    Behavior During Therapy [facility] for tasks assessed/performed             Past Medical History:  Diagnosis Date   Arthritis    Denies   Glaucoma    High cholesterol    Hypertension    denies   Osteomyelitis ([place])    Right great toe   Peripheral vascular disease ([place])    diabetic  with osteomylitis   Pre-diabetes    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    Past Surgical History:  Procedure Laterality Date   AMPUTATION  07/23/2011   Procedure: AMPUTATION DIGIT;  Surgeon: Newt Minion, MD;  Location: [place];  Service: Orthopedics;  Laterality: Right;  Right Great Toe Amputation   AMPUTATION Right 09/18/2012   Procedure: Right Foot 2nd Ray Amputation;  Surgeon: Newt Minion, MD;  Location: [place];  Service: Orthopedics;  Laterality: Right;  Right Foot 2nd Ray Amputation   AMPUTATION Right 10/14/2012   Procedure: AMPUTATION FOOT;  Surgeon: Newt Minion, MD;  Location: [place];  Service: Orthopedics;  Laterality: Right;  Right Foot Transmetatarsal Amputation   AMPUTATION Left 09/23/2014   Procedure: Left Foot Transmetatarsal Amputation;  Surgeon: Newt Minion, MD;  Location: [place];  Service: Orthopedics;  Laterality: Left;   AMPUTATION Left 11/22/2020   Procedure: LEFT BELOW KNEE AMPUTATION;   Surgeon: Newt Minion, MD;  Location: [place];  Service: Orthopedics;  Laterality: Left;   BACK SURGERY     lower   CARPAL TUNNEL RELEASE Right 01/30/2018   Procedure: RIGHT CARPAL TUNNEL RELEASE;  Surgeon: Newt Minion, MD;  Location: [place];  Service: Orthopedics;  Laterality: Right;   CIRCUMCISION  2010   COLONOSCOPY     FOOT FRACTURE SURGERY Left 1970's   "broke it playing football" (10/12/2012)   HUMERUS FRACTURE SURGERY W/ IMPLANT Right 1960's   "put a plate in it" (5/46/2703)   I & D EXTREMITY Right 03/24/2020   Procedure: EXCISION RIGHT MEDIAL CUNEFORM;  Surgeon: Newt Minion, MD;  Location: [place];  Service: Orthopedics;  Laterality: Right;   INCISION AND DRAINAGE OF WOUND Left 2006   "foot" (10/12/2012)   [place] SURGERY  2008    There were no vitals filed for this visit.   Subjective Assessment - 06/19/21 0822     Subjective He denies pain or issues with prosthesis. He states he wears it "as much as he can stand which is about 4-5 hours"    Patient is accompained by: Family member   niece   Pertinent History arthritis, glaucoma, HTN, osteomyelitis, PVD, DM2, CVA cerebellum, right toe amputations, NSTEMI, gout    Patient Stated Goals use prosthesis to  go into community including church & fishing.                               [place] Adult PT Treatment/Exercise - 06/19/21 0001       Transfers   Transfers Sit to Stand;Stand to Sit;Stand Pivot Transfers    Sit to Stand 4: Min assist;With upper extremity assist;With armrests;From chair/3-in-1;Other (comment);3: Mod assist   to locked rollator RW, MinA from w/c and ModA from chair without armrests   Sit to Stand Details Verbal cues for technique;Tactile cues for weight shifting;Manual facilitation for weight shifting;Verbal cues for safe use of DME/AE;Visual cues for safe use of DME/AE    Stand to Sit 4: Min guard;With upper extremity assist;With armrests;To chair/3-in-1;Other (comment);4: Min assist    from locked rollator RW, min guard w/c and minA chairs without armrests.   Stand to Sit Details (indicate cue type and reason) Verbal cues for technique;Manual facilitation for weight shifting;Verbal cues for safe use of DME/AE;Visual cues for safe use of DME/AE    Squat Pivot Transfers 5: Supervision   over armrests with prosthesis on limb to assist     Ambulation/Gait   Ambulation/Gait Yes    Ambulation/Gait Assistance 4: Min assist   PT providing verbal cues to [facility] and his niece providing visual cues in front for direction due to poor vision   Ambulation Distance (Feet) 125 Feet   X2   Assistive device Rolling walker;Prosthesis      Knee/Hip Exercises: Machines for Strengthening   Total Gym Leg Press shuttle BLEs position 3 with 62# 15reps 3 sets with tactile cues for motion      Knee/Hip Exercises: Standing   Other Standing Knee Exercises sidestepping at counter top with bilat UE support 3 round trips    Other Standing Knee Exercises mini squats at sink with bilat UE support and chair behind him X10 reps      Prosthetics   Prosthetic Care Comments  PT recommended increasing wear to most of awake hours so he has two feet to assist when he wants to move.    Current prosthetic wear tolerance (#hours/day)  reports 4-5 hrs 1x/day    Education Provided Proper wear schedule/adjustment;Other (comment)   see prosthetic care comments                      PT Short Term Goals - 06/12/21 1236       PT SHORT TERM GOAL #1   Title Patient donnes prosthesis without cues or physical assist.    Time 4    Period Weeks    Status Revised    Target Date 07/05/21      PT SHORT TERM GOAL #2   Title Patient tolerates prosthesis >8 hrs total /day without skin issues or limb pain    Time 4    Period Weeks    Status On-going    Target Date 07/05/21      PT SHORT TERM GOAL #3   Title Patient able to transfer sit/stand std chair height without armrests using UEs to push to RW  safely.    Time 4    Period Weeks    Status Revised    Target Date 07/05/21      PT SHORT TERM GOAL #4   Title Patient ambulates 200' with RW & prosthesis with supervision.    Time 4    Period Weeks  Status Revised    Target Date 07/05/21      PT SHORT TERM GOAL #5   Title .Patient negotiates ramps & curbs with RW & prosthesis with minA from niece with PT giving cues    Time 4    Period Weeks    Status Revised    Target Date 07/05/21               PT Long Term Goals - 06/04/21 0842       PT LONG TERM GOAL #1   Title Patient & family are independent with prosthetic care and pt able to donne prosthesis modified independent.    Time 12    Period Weeks    Status On-going    Target Date 08/02/21      PT LONG TERM GOAL #2   Title Patient tolerates prosthesis wear >90% of awake hours without skin or limb pain issues.    Time 12    Period Weeks    Status On-going    Target Date 08/02/21      PT LONG TERM GOAL #3   Title Berg Balance >/= 36/56 with RW support to indicate lower fall risk    Time 12    Period Weeks    Status On-going    Target Date 08/02/21      PT LONG TERM GOAL #4   Title Patient ambulates 300' with RW & prosthesis modified independent.    Time 12    Period Weeks    Status On-going    Target Date 08/02/21      PT LONG TERM GOAL #5   Title Patient negotiates ramps, curbs with RW & stairs with single rail & prosthesis modified independent.    Time 12    Period Weeks    Status On-going    Target Date 08/02/21                   Plan - 06/19/21 0825     Clinical Impression Statement We worked to improve his functional endurance with prosthethic gait, transfers, and strengthening exercises. He does require rest breaks and requires verbal cues to navigate obstacles due to poor vision. We will continue to work to improve endurance, strength, and funcitonal mobility as tolerated.    Personal Factors and Comorbidities Comorbidity  3+;Past/Current Experience;Other   lives alone   Comorbidities arthritis, glaucoma, HTN, osteomyelitis, PVD, DM2, CVA cerebellum, right transmetatarsal amputation, NSTEMI, gout    Examination-Activity Limitations Locomotion Level;Reach Overhead;Stairs;Stand;Toileting;Transfers    Examination-Participation Restrictions Community Activity    Stability/Clinical Decision Making Evolving/Moderate complexity    Rehab Potential Good    PT Frequency 2x / week    PT Duration 12 weeks    PT Treatment/Interventions ADLs/Self Care Home Management;DME Instruction;Gait training;Stair training;Functional mobility training;Therapeutic activities;Therapeutic exercise;Balance training;Neuromuscular re-education;Patient/family education;Prosthetic Training;Manual techniques;Passive range of motion;Vestibular    PT Next Visit Plan work towards updated STGs, continue review prosthetic care, prosthetic gait with RW, standing balance activities, discuss rollator vs std RW with pt & niece    Consulted and Agree with Plan of Care Patient;Family member/caregiver    Family Member Consulted neice, Arlana Lindau             Patient will benefit from skilled therapeutic intervention in order to improve the following deficits and impairments:  Abnormal gait, Decreased activity tolerance, Decreased balance, Decreased cognition, Decreased endurance, Decreased knowledge of use of DME, Decreased mobility, Decreased range of motion, Decreased strength, Impaired flexibility, Postural dysfunction, Prosthetic  Dependency  Visit Diagnosis: Unsteadiness on feet  Other abnormalities of gait and mobility  Muscle weakness (generalized)  Stiffness of left knee, not elsewhere classified  Abnormal posture     Problem List Patient Active Problem List   Diagnosis Date Noted   Abscess of bursa of left ankle 11/22/2020   Subacute osteomyelitis, left ankle and foot ([place])    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  ([place]) 04/12/2020   Acute encephalopathy 03/20/2020   SIRS (systemic inflammatory response syndrome) ([place]) 03/20/2020   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[place]) 03/20/2020   ARF (acute renal failure) ([place]) 03/20/2020   Midfoot ulcer, left, limited to breakdown of skin ([place]) 12/29/2018   Occipital stroke ([place]) 10/31/2018   DM2 (diabetes mellitus, type 2) ([place]) 10/31/2018   NSTEMI (non-ST elevated myocardial infarction) ([place]) 05/28/2018   Syncope 05/28/2018   HTN (hypertension) 05/28/2018   Hyperlipemia, mixed 05/28/2018   Rash and nonspecific skin eruption 12/16/2017   Pain in right hand 09/11/2017   Carpal tunnel syndrome, right upper limb 07/01/2017   Arthritis of carpometacarpal [facility]) joint of left thumb 12/30/2016   Midfoot skin ulcer, right, limited to breakdown of skin ([place]) 07/22/2016   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[place]) 09/23/2014   Osteomyelitis of ankle or foot, left, acute ([place]) 08/23/2014   Cellulitis 10/12/2012   Chest pain 10/12/2012   Tobacco abuse 10/12/2012   ABSCESS, AXILLA, LEFT 02/23/2008   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   CHEST WALL PAIN, ACUTE 12/03/2007   CANDIDIASIS, GLANS PENIS 09/16/2007   HEADACHE 08/04/2007   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 [place], [place], [address] Phone: [phone]   Fax:  [phone]  Name: [name] MRN: [id number] Date of Birth: [birth year]

## 2021-06-21 ENCOUNTER — Ambulatory Visit (INDEPENDENT_AMBULATORY_CARE_PROVIDER_SITE_OTHER): Payer: Medicare Other | Admitting: Physical Therapy

## 2021-06-21 ENCOUNTER — Other Ambulatory Visit: Payer: Self-pay

## 2021-06-21 DIAGNOSIS — R2689 Other abnormalities of gait and mobility: Secondary | ICD-10-CM

## 2021-06-21 DIAGNOSIS — M25662 Stiffness of left knee, not elsewhere classified: Secondary | ICD-10-CM

## 2021-06-21 DIAGNOSIS — R293 Abnormal posture: Secondary | ICD-10-CM

## 2021-06-21 DIAGNOSIS — R2681 Unsteadiness on feet: Secondary | ICD-10-CM | POA: Diagnosis not present

## 2021-06-21 DIAGNOSIS — M6281 Muscle weakness (generalized): Secondary | ICD-10-CM | POA: Diagnosis not present

## 2021-06-21 NOTE — Therapy (Signed)
[facility] [address] [place], [place], [address] Phone: [phone]   Fax:  [phone]  Physical Therapy Treatment  Patient Details  Name: [name] MRN: [id number] Date of Birth: Feb 06, 1941 Referring Provider (PT): Meridee Score, MD   Encounter Date: 06/21/2021   PT End of Session - 06/21/21 0903     Visit Number 12    Number of Visits 25    Date for PT Re-Evaluation 08/02/21    Authorization Type UHC Medicare    Progress Note Due on Visit 20    PT Start Time 0803    PT Stop Time 0845    PT Time Calculation (min) 42 min    Equipment Utilized During Treatment Gait belt    Activity Tolerance Patient tolerated treatment well;Patient limited by fatigue    Behavior During Therapy [facility] for tasks assessed/performed             Past Medical History:  Diagnosis Date   Arthritis    Denies   Glaucoma    High cholesterol    Hypertension    denies   Osteomyelitis ([place])    Right great toe   Peripheral vascular disease ([place])    diabetic  with osteomylitis   Pre-diabetes    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    Past Surgical History:  Procedure Laterality Date   AMPUTATION  07/23/2011   Procedure: AMPUTATION DIGIT;  Surgeon: Newt Minion, MD;  Location: [place];  Service: Orthopedics;  Laterality: Right;  Right Great Toe Amputation   AMPUTATION Right 09/18/2012   Procedure: Right Foot 2nd Ray Amputation;  Surgeon: Newt Minion, MD;  Location: [place];  Service: Orthopedics;  Laterality: Right;  Right Foot 2nd Ray Amputation   AMPUTATION Right 10/14/2012   Procedure: AMPUTATION FOOT;  Surgeon: Newt Minion, MD;  Location: [place];  Service: Orthopedics;  Laterality: Right;  Right Foot Transmetatarsal Amputation   AMPUTATION Left 09/23/2014   Procedure: Left Foot Transmetatarsal Amputation;  Surgeon: Newt Minion, MD;  Location: [place];  Service: Orthopedics;  Laterality: Left;   AMPUTATION Left 11/22/2020   Procedure: LEFT BELOW KNEE AMPUTATION;   Surgeon: Newt Minion, MD;  Location: [place];  Service: Orthopedics;  Laterality: Left;   BACK SURGERY     lower   CARPAL TUNNEL RELEASE Right 01/30/2018   Procedure: RIGHT CARPAL TUNNEL RELEASE;  Surgeon: Newt Minion, MD;  Location: [place];  Service: Orthopedics;  Laterality: Right;   CIRCUMCISION  2010   COLONOSCOPY     FOOT FRACTURE SURGERY Left 1970's   "broke it playing football" (10/12/2012)   HUMERUS FRACTURE SURGERY W/ IMPLANT Right 1960's   "put a plate in it" (09/16/270)   I & D EXTREMITY Right 03/24/2020   Procedure: EXCISION RIGHT MEDIAL CUNEFORM;  Surgeon: Newt Minion, MD;  Location: [place];  Service: Orthopedics;  Laterality: Right;   INCISION AND DRAINAGE OF WOUND Left 2006   "foot" (10/12/2012)   [place] SURGERY  2008    There were no vitals filed for this visit.   Subjective Assessment - 06/21/21 0842     Subjective He relays he feels like something is "catching or pulling on his lower prosthesis"    Patient is accompained by: Family member   niece   Pertinent History arthritis, glaucoma, HTN, osteomyelitis, PVD, DM2, CVA cerebellum, right toe amputations, NSTEMI, gout    Patient Stated Goals use prosthesis to go into community including church & fishing.  [place] Adult PT Treatment/Exercise - 06/21/21 0001       Transfers   Transfers Sit to Stand;Stand to Sit;Stand Pivot Transfers    Sit to Stand 4: Min assist;With upper extremity assist;With armrests;From chair/3-in-1;Other (comment);3: Mod assist   to locked rollator RW, MinA from w/c and ModA from chair without armrests   Sit to Stand Details Verbal cues for technique;Tactile cues for weight shifting;Manual facilitation for weight shifting;Verbal cues for safe use of DME/AE;Visual cues for safe use of DME/AE    Stand to Sit 4: Min guard;With upper extremity assist;With armrests;To chair/3-in-1;Other (comment);4: Min assist   from locked rollator RW, min guard  w/c and minA chairs without armrests.   Stand to Sit Details (indicate cue type and reason) Verbal cues for technique;Manual facilitation for weight shifting;Verbal cues for safe use of DME/AE;Visual cues for safe use of DME/AE    Squat Pivot Transfers 5: Supervision   over armrests with prosthesis on limb to assist     Ambulation/Gait   Ambulation/Gait Yes    Ambulation/Gait Assistance 4: Min assist   PT providing verbal cues to [facility] and his niece providing visual cues in front for direction due to poor vision   Ambulation Distance (Feet) 125 Feet   X3   Assistive device Rolling walker;Prosthesis      Knee/Hip Exercises: Aerobic   Nustep seat 14 L7 UE/LE X8 min      Knee/Hip Exercises: Machines for Strengthening   Total Gym Leg Press shuttle BLEs position 3 with 62# 15reps 3 sets with tactile cues for motion      Knee/Hip Exercises: Standing   Other Standing Knee Exercises sidestepping at counter top with bilat UE support 3 round trips    Other Standing Knee Exercises mini squats at sink with bilat UE support and chair behind him X10 reps                       PT Short Term Goals - 06/12/21 1236       PT SHORT TERM GOAL #1   Title Patient donnes prosthesis without cues or physical assist.    Time 4    Period Weeks    Status Revised    Target Date 07/05/21      PT SHORT TERM GOAL #2   Title Patient tolerates prosthesis >8 hrs total /day without skin issues or limb pain    Time 4    Period Weeks    Status On-going    Target Date 07/05/21      PT SHORT TERM GOAL #3   Title Patient able to transfer sit/stand std chair height without armrests using UEs to push to RW safely.    Time 4    Period Weeks    Status Revised    Target Date 07/05/21      PT SHORT TERM GOAL #4   Title Patient ambulates 200' with RW & prosthesis with supervision.    Time 4    Period Weeks    Status Revised    Target Date 07/05/21      PT SHORT TERM GOAL #5   Title  .Patient negotiates ramps & curbs with RW & prosthesis with minA from niece with PT giving cues    Time 4    Period Weeks    Status Revised    Target Date 07/05/21               PT Long Term Goals -  06/04/21 0842       PT LONG TERM GOAL #1   Title Patient & family are independent with prosthetic care and pt able to donne prosthesis modified independent.    Time 12    Period Weeks    Status On-going    Target Date 08/02/21      PT LONG TERM GOAL #2   Title Patient tolerates prosthesis wear >90% of awake hours without skin or limb pain issues.    Time 12    Period Weeks    Status On-going    Target Date 08/02/21      PT LONG TERM GOAL #3   Title Berg Balance >/= 36/56 with RW support to indicate lower fall risk    Time 12    Period Weeks    Status On-going    Target Date 08/02/21      PT LONG TERM GOAL #4   Title Patient ambulates 300' with RW & prosthesis modified independent.    Time 12    Period Weeks    Status On-going    Target Date 08/02/21      PT LONG TERM GOAL #5   Title Patient negotiates ramps, curbs with RW & stairs with single rail & prosthesis modified independent.    Time 12    Period Weeks    Status On-going    Target Date 08/02/21                   Plan - 06/21/21 0843     Clinical Impression Statement I doffed his prosthesis and then donned it to see if that would help his sensations of it is "pulling or catching". This did not help during session so I encouraged him to set up appointment with his prosthesis to check this. PT will continue to work toward his funcitonal endurance, strength, and ambulation.    Personal Factors and Comorbidities Comorbidity 3+;Past/Current Experience;Other   lives alone   Comorbidities arthritis, glaucoma, HTN, osteomyelitis, PVD, DM2, CVA cerebellum, right transmetatarsal amputation, NSTEMI, gout    Examination-Activity Limitations Locomotion Level;Reach Overhead;Stairs;Stand;Toileting;Transfers     Examination-Participation Restrictions Community Activity    Stability/Clinical Decision Making Evolving/Moderate complexity    Rehab Potential Good    PT Frequency 2x / week    PT Duration 12 weeks    PT Treatment/Interventions ADLs/Self Care Home Management;DME Instruction;Gait training;Stair training;Functional mobility training;Therapeutic activities;Therapeutic exercise;Balance training;Neuromuscular re-education;Patient/family education;Prosthetic Training;Manual techniques;Passive range of motion;Vestibular    PT Next Visit Plan work towards updated STGs, continue review prosthetic care, prosthetic gait with RW, standing balance activities, discuss rollator vs std RW with pt & niece    Consulted and Agree with Plan of Care Patient;Family member/caregiver    Family Member Consulted neice, Arlana Lindau             Patient will benefit from skilled therapeutic intervention in order to improve the following deficits and impairments:  Abnormal gait, Decreased activity tolerance, Decreased balance, Decreased cognition, Decreased endurance, Decreased knowledge of use of DME, Decreased mobility, Decreased range of motion, Decreased strength, Impaired flexibility, Postural dysfunction, Prosthetic Dependency  Visit Diagnosis: Unsteadiness on feet  Other abnormalities of gait and mobility  Muscle weakness (generalized)  Stiffness of left knee, not elsewhere classified  Abnormal posture     Problem List Patient Active Problem List   Diagnosis Date Noted   Abscess of bursa of left ankle 11/22/2020   Subacute osteomyelitis, left ankle and foot (H[facility])    History of partial  amputation of toe of right foot ([place]) 04/12/2020   Acute encephalopathy 03/20/2020   SIRS (systemic inflammatory response syndrome) ([place]) 03/20/2020   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[place]) 03/20/2020   ARF (acute renal failure) ([place]) 03/20/2020   Midfoot ulcer, left, limited to breakdown of skin ([place]) 12/29/2018    Occipital stroke ([place]) 10/31/2018   DM2 (diabetes mellitus, type 2) ([place]) 10/31/2018   NSTEMI (non-ST elevated myocardial infarction) ([place]) 05/28/2018   Syncope 05/28/2018   HTN (hypertension) 05/28/2018   Hyperlipemia, mixed 05/28/2018   Rash and nonspecific skin eruption 12/16/2017   Pain in right hand 09/11/2017   Carpal tunnel syndrome, right upper limb 07/01/2017   Arthritis of carpometacarpal [facility]) joint of left thumb 12/30/2016   Midfoot skin ulcer, right, limited to breakdown of skin ([place]) 07/22/2016   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[place]) 09/23/2014   Osteomyelitis of ankle or foot, left, acute ([place]) 08/23/2014   Cellulitis 10/12/2012   Chest pain 10/12/2012   Tobacco abuse 10/12/2012   ABSCESS, AXILLA, LEFT 02/23/2008   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   CHEST WALL PAIN, ACUTE 12/03/2007   CANDIDIASIS, GLANS PENIS 09/16/2007   HEADACHE 08/04/2007   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

## 2021-06-25 ENCOUNTER — Ambulatory Visit (INDEPENDENT_AMBULATORY_CARE_PROVIDER_SITE_OTHER): Payer: Medicare Other | Admitting: Physical Therapy

## 2021-06-25 ENCOUNTER — Other Ambulatory Visit: Payer: Self-pay

## 2021-06-25 DIAGNOSIS — R2689 Other abnormalities of gait and mobility: Secondary | ICD-10-CM

## 2021-06-25 DIAGNOSIS — R2681 Unsteadiness on feet: Secondary | ICD-10-CM | POA: Diagnosis not present

## 2021-06-25 DIAGNOSIS — R293 Abnormal posture: Secondary | ICD-10-CM

## 2021-06-25 DIAGNOSIS — M25662 Stiffness of left knee, not elsewhere classified: Secondary | ICD-10-CM

## 2021-06-25 DIAGNOSIS — M6281 Muscle weakness (generalized): Secondary | ICD-10-CM | POA: Diagnosis not present

## 2021-06-25 NOTE — Therapy (Signed)
[facility] [address] [place], [place], [address] Phone: [phone]   Fax:  [phone]  Physical Therapy Treatment  Patient Details  Name: [name] MRN: [id number] Date of Birth: June 10, 1941 Referring Provider (PT): Meridee Score, MD   Encounter Date: 06/25/2021   PT End of Session - 06/25/21 0939     Visit Number 13    Number of Visits 25    Date for PT Re-Evaluation 08/02/21    Authorization Type UHC Medicare    Progress Note Due on Visit 40    PT Start Time 0805    PT Stop Time 0845    PT Time Calculation (min) 40 min    Equipment Utilized During Treatment Gait belt    Activity Tolerance Patient tolerated treatment well;Patient limited by fatigue    Behavior During Therapy [facility] for tasks assessed/performed             Past Medical History:  Diagnosis Date   Arthritis    Denies   Glaucoma    High cholesterol    Hypertension    denies   Osteomyelitis ([place])    Right great toe   Peripheral vascular disease ([place])    diabetic  with osteomylitis   Pre-diabetes    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    Past Surgical History:  Procedure Laterality Date   AMPUTATION  07/23/2011   Procedure: AMPUTATION DIGIT;  Surgeon: Newt Minion, MD;  Location: [place];  Service: Orthopedics;  Laterality: Right;  Right Great Toe Amputation   AMPUTATION Right 09/18/2012   Procedure: Right Foot 2nd Ray Amputation;  Surgeon: Newt Minion, MD;  Location: [place];  Service: Orthopedics;  Laterality: Right;  Right Foot 2nd Ray Amputation   AMPUTATION Right 10/14/2012   Procedure: AMPUTATION FOOT;  Surgeon: Newt Minion, MD;  Location: [place];  Service: Orthopedics;  Laterality: Right;  Right Foot Transmetatarsal Amputation   AMPUTATION Left 09/23/2014   Procedure: Left Foot Transmetatarsal Amputation;  Surgeon: Newt Minion, MD;  Location: [place];  Service: Orthopedics;  Laterality: Left;   AMPUTATION Left 11/22/2020   Procedure: LEFT BELOW KNEE AMPUTATION;   Surgeon: Newt Minion, MD;  Location: [place];  Service: Orthopedics;  Laterality: Left;   BACK SURGERY     lower   CARPAL TUNNEL RELEASE Right 01/30/2018   Procedure: RIGHT CARPAL TUNNEL RELEASE;  Surgeon: Newt Minion, MD;  Location: [facility];  Service: Orthopedics;  Laterality: Right;   CIRCUMCISION  2010   COLONOSCOPY     FOOT FRACTURE SURGERY Left 1970's   "broke it playing football" (10/12/2012)   HUMERUS FRACTURE SURGERY W/ IMPLANT Right 1960's   "put a plate in it" (10/03/3788)   I & D EXTREMITY Right 03/24/2020   Procedure: EXCISION RIGHT MEDIAL CUNEFORM;  Surgeon: Newt Minion, MD;  Location: [place];  Service: Orthopedics;  Laterality: Right;   INCISION AND DRAINAGE OF WOUND Left 2006   "foot" (10/12/2012)   [place] SURGERY  2008    There were no vitals filed for this visit.   Subjective Assessment - 06/25/21 0819     Subjective He still relays it feels like his pants get caught on his lower protheis when he is trying to walk however I have them rolled up so this not likely what he is feeling.    Patient is accompained by: Family member   niece   Pertinent History arthritis, glaucoma, HTN, osteomyelitis, PVD, DM2, CVA cerebellum, right toe amputations,  NSTEMI, gout    Patient Stated Goals use prosthesis to go into community including church & fishing.                               [place] Adult PT Treatment/Exercise - 06/25/21 0001       Transfers   Transfers Sit to Stand;Stand to Sit;Stand Pivot Transfers    Sit to Stand 4: Min assist;With upper extremity assist;With armrests;From chair/3-in-1;Other (comment);3: Mod assist   to locked rollator RW, MinA from w/c and ModA from chair without armrests   Sit to Stand Details Verbal cues for technique;Tactile cues for weight shifting;Manual facilitation for weight shifting;Verbal cues for safe use of DME/AE;Visual cues for safe use of DME/AE    Stand to Sit 4: Min guard;With upper extremity assist;With  armrests;To chair/3-in-1;Other (comment);4: Min assist   from locked rollator RW, min guard w/c and minA chairs without armrests.   Stand to Sit Details (indicate cue type and reason) Verbal cues for technique;Manual facilitation for weight shifting;Verbal cues for safe use of DME/AE;Visual cues for safe use of DME/AE    Squat Pivot Transfers 5: Supervision   over armrests with prosthesis on limb to assist     Ambulation/Gait   Ambulation/Gait Yes    Ambulation/Gait Assistance 4: Min assist   PT providing verbal cues to [facility] and his niece providing visual cues in front for direction due to poor vision   Ambulation Distance (Feet) 125 Feet   X3   Assistive device Rolling walker;Prosthesis    Ramp 3: Mod assist   with RW and prothesis he had difficulty with Lt foot clearance ascending needing cueing and assistance   Curb 4: Min assist   RW and prothesis,and cues for walker placement and technique     High Level Balance   High Level Balance Activities Side stepping;Backward walking    High Level Balance Comments 3 round trips with bilat UE support in bars      Neuro Re-ed    Neuro Re-ed Details  balance on airex pad with feet apart X 1 min, balance on flat surface feet narrow X 1 min      Knee/Hip Exercises: Aerobic   Nustep seat 14 L7 UE/LE X8 min      Knee/Hip Exercises: Machines for Strengthening   Total Gym Leg Press shuttle BLEs position 3 with 62# 15reps 3 sets with tactile cues for motion      Knee/Hip Exercises: Standing   Other Standing Knee Exercises squats to wheelchair with bilat UE support X10                       PT Short Term Goals - 06/12/21 1236       PT SHORT TERM GOAL #1   Title Patient donnes prosthesis without cues or physical assist.    Time 4    Period Weeks    Status Revised    Target Date 07/05/21      PT SHORT TERM GOAL #2   Title Patient tolerates prosthesis >8 hrs total /day without skin issues or limb pain    Time 4     Period Weeks    Status On-going    Target Date 07/05/21      PT SHORT TERM GOAL #3   Title Patient able to transfer sit/stand std chair height without armrests using UEs to push to RW safely.  Time 4    Period Weeks    Status Revised    Target Date 07/05/21      PT SHORT TERM GOAL #4   Title Patient ambulates 200' with RW & prosthesis with supervision.    Time 4    Period Weeks    Status Revised    Target Date 07/05/21      PT SHORT TERM GOAL #5   Title .Patient negotiates ramps & curbs with RW & prosthesis with minA from niece with PT giving cues    Time 4    Period Weeks    Status Revised    Target Date 07/05/21               PT Long Term Goals - 06/04/21 0842       PT LONG TERM GOAL #1   Title Patient & family are independent with prosthetic care and pt able to donne prosthesis modified independent.    Time 12    Period Weeks    Status On-going    Target Date 08/02/21      PT LONG TERM GOAL #2   Title Patient tolerates prosthesis wear >90% of awake hours without skin or limb pain issues.    Time 12    Period Weeks    Status On-going    Target Date 08/02/21      PT LONG TERM GOAL #3   Title Berg Balance >/= 36/56 with RW support to indicate lower fall risk    Time 12    Period Weeks    Status On-going    Target Date 08/02/21      PT LONG TERM GOAL #4   Title Patient ambulates 300' with RW & prosthesis modified independent.    Time 12    Period Weeks    Status On-going    Target Date 08/02/21      PT LONG TERM GOAL #5   Title Patient negotiates ramps, curbs with RW & stairs with single rail & prosthesis modified independent.    Time 12    Period Weeks    Status On-going    Target Date 08/02/21                   Plan - 06/25/21 0940     Clinical Impression Statement We again performed session with his pants leg rolled up past his prosthesis as he still complaints of it "catching on his pants". He is improving with ambulation using  RW but did have increased difficulty curb today due to clearing his Lt prothesis ascending. PT will continue to work to progress his overall function.    Personal Factors and Comorbidities Comorbidity 3+;Past/Current Experience;Other   lives alone   Comorbidities arthritis, glaucoma, HTN, osteomyelitis, PVD, DM2, CVA cerebellum, right transmetatarsal amputation, NSTEMI, gout    Examination-Activity Limitations Locomotion Level;Reach Overhead;Stairs;Stand;Toileting;Transfers    Examination-Participation Restrictions Community Activity    Stability/Clinical Decision Making Evolving/Moderate complexity    Rehab Potential Good    PT Frequency 2x / week    PT Duration 12 weeks    PT Treatment/Interventions ADLs/Self Care Home Management;DME Instruction;Gait training;Stair training;Functional mobility training;Therapeutic activities;Therapeutic exercise;Balance training;Neuromuscular re-education;Patient/family education;Prosthetic Training;Manual techniques;Passive range of motion;Vestibular    PT Next Visit Plan work towards updated STGs, continue review prosthetic care, prosthetic gait with RW, standing balance activities, discuss rollator vs std RW with pt & niece    Consulted and Agree with Plan of Care Patient;Family member/caregiver    Family Member  Consulted neice, Arlana [place]             Patient will benefit from skilled therapeutic intervention in order to improve the following deficits and impairments:  Abnormal gait, Decreased activity tolerance, Decreased balance, Decreased cognition, Decreased endurance, Decreased knowledge of use of DME, Decreased mobility, Decreased range of motion, Decreased strength, Impaired flexibility, Postural dysfunction, Prosthetic Dependency  Visit Diagnosis: Unsteadiness on feet  Other abnormalities of gait and mobility  Muscle weakness (generalized)  Stiffness of left knee, not elsewhere classified  Abnormal posture     Problem List Patient  Active Problem List   Diagnosis Date Noted   Abscess of bursa of left ankle 11/22/2020   Subacute osteomyelitis, left ankle and foot ([place])    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 ([place]) 04/12/2020   Acute encephalopathy 03/20/2020   SIRS (systemic inflammatory response syndrome) ([place]) 03/20/2020   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[place]) 03/20/2020   ARF (acute renal failure) ([place]) 03/20/2020   Midfoot ulcer, left, limited to breakdown of skin ([place]) 12/29/2018   Occipital stroke ([place]) 10/31/2018   DM2 (diabetes mellitus, type 2) ([place]) 10/31/2018   NSTEMI (non-ST elevated myocardial infarction) ([place]) 05/28/2018   Syncope 05/28/2018   HTN (hypertension) 05/28/2018   Hyperlipemia, mixed 05/28/2018   Rash and nonspecific skin eruption 12/16/2017   Pain in right hand 09/11/2017   Carpal tunnel syndrome, right upper limb 07/01/2017   Arthritis of carpometacarpal [facility]) joint of left thumb 12/30/2016   Midfoot skin ulcer, right, limited to breakdown of skin ([place]) 07/22/2016   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[place]) 09/23/2014   Osteomyelitis of ankle or foot, left, acute ([place]) 08/23/2014   Cellulitis 10/12/2012   Chest pain 10/12/2012   Tobacco abuse 10/12/2012   ABSCESS, AXILLA, LEFT 02/23/2008   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   CHEST WALL PAIN, ACUTE 12/03/2007   CANDIDIASIS, GLANS PENIS 09/16/2007   HEADACHE 08/04/2007   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

## 2021-06-28 ENCOUNTER — Ambulatory Visit (INDEPENDENT_AMBULATORY_CARE_PROVIDER_SITE_OTHER): Payer: Medicare Other | Admitting: Physical Therapy

## 2021-06-28 ENCOUNTER — Other Ambulatory Visit: Payer: Self-pay

## 2021-06-28 ENCOUNTER — Encounter: Payer: Self-pay | Admitting: Oncology

## 2021-06-28 ENCOUNTER — Telehealth: Payer: Self-pay

## 2021-06-28 ENCOUNTER — Other Ambulatory Visit (HOSPITAL_COMMUNITY): Payer: Self-pay

## 2021-06-28 ENCOUNTER — Telehealth: Payer: Self-pay | Admitting: Physical Therapy

## 2021-06-28 ENCOUNTER — Inpatient Hospital Stay: Payer: Medicare Other | Attending: Oncology | Admitting: Oncology

## 2021-06-28 VITALS — BP 118/69 | HR 80 | Temp 98.6°F | Resp 18 | Ht 73.0 in

## 2021-06-28 DIAGNOSIS — M25662 Stiffness of left knee, not elsewhere classified: Secondary | ICD-10-CM

## 2021-06-28 DIAGNOSIS — M6281 Muscle weakness (generalized): Secondary | ICD-10-CM | POA: Diagnosis not present

## 2021-06-28 DIAGNOSIS — C61 Malignant neoplasm of prostate: Secondary | ICD-10-CM | POA: Diagnosis not present

## 2021-06-28 DIAGNOSIS — R2681 Unsteadiness on feet: Secondary | ICD-10-CM | POA: Diagnosis not present

## 2021-06-28 DIAGNOSIS — C7951 Secondary malignant neoplasm of bone: Secondary | ICD-10-CM | POA: Insufficient documentation

## 2021-06-28 DIAGNOSIS — R2689 Other abnormalities of gait and mobility: Secondary | ICD-10-CM | POA: Diagnosis not present

## 2021-06-28 DIAGNOSIS — R293 Abnormal posture: Secondary | ICD-10-CM

## 2021-06-28 DIAGNOSIS — Z79899 Other long term (current) drug therapy: Secondary | ICD-10-CM | POA: Insufficient documentation

## 2021-06-28 DIAGNOSIS — F17210 Nicotine dependence, cigarettes, uncomplicated: Secondary | ICD-10-CM | POA: Insufficient documentation

## 2021-06-28 DIAGNOSIS — I10 Essential (primary) hypertension: Secondary | ICD-10-CM | POA: Insufficient documentation

## 2021-06-28 DIAGNOSIS — Z7901 Long term (current) use of anticoagulants: Secondary | ICD-10-CM | POA: Insufficient documentation

## 2021-06-28 MED ORDER — XTANDI 40 MG PO CAPS
160.0000 mg | ORAL_CAPSULE | Freq: Every day | ORAL | 0 refills | Status: DC
  Filled 2021-06-28 – 2021-06-29 (×2): qty 120, 30d supply, fill #0

## 2021-06-28 NOTE — Progress Notes (Signed)
Reason for the request:    Prostate cancer  HPI: I was asked by Dr. Louis Meckel to evaluate [name] for evaluation of prostate cancer.  He is a 81 year old man with a history of hypertension, osteomyelitis and peripheral vascular disease with recent diagnosis of prostate cancer.  He presented on March 13, 2021 with a PSA of 321.  He underwent CT scan chest abdomen and pelvis which showed bulky retroperitoneal adenopathy in the periaortic region as well as enlarged prostate.  Lytic lesion within the femoral neck was also seen.  Based on these findings he was evaluated by Dr. Louis Meckel and a prostate biopsy completed on May 21, 2021 which showed a Gleason score 4+5 = 9.  Based on these findings, he was started on Firmagon.  PSA on April 30, 2021 was 415.  Clinically, he reports no complaints at this time.  He denies any recent pathological fractures or falls.  His performance status is limited but remained adequate.  He uses a cane and wheelchair for ambulation due to his amputation.   He does not report any headaches, blurry vision, syncope or seizures. Does not report any fevers, chills or sweats.  Does not report any cough, wheezing or hemoptysis.  Does not report any chest pain, palpitation, orthopnea or leg edema.  Does not report any nausea, vomiting or abdominal pain.  Does not report any constipation or diarrhea.  Does not report any skeletal complaints.    Does not report frequency, urgency or hematuria.  Does not report any skin rashes or lesions. Does not report any heat or cold intolerance.  Does not report any lymphadenopathy or petechiae.  Does not report any anxiety or depression.  Remaining review of systems is negative.     Past Medical History:  Diagnosis Date   Arthritis    Denies   Glaucoma    High cholesterol    Hypertension    denies   Osteomyelitis ([place])    Right great toe   Peripheral vascular disease ([place])    diabetic  with osteomylitis   Pre-diabetes    Stroke  (cerebrum) ([place]) 10/2018  :   Past Surgical History:  Procedure Laterality Date   AMPUTATION  07/23/2011   Procedure: AMPUTATION DIGIT;  Surgeon: Newt Minion, MD;  Location: [place];  Service: Orthopedics;  Laterality: Right;  Right Great Toe Amputation   AMPUTATION Right 09/18/2012   Procedure: Right Foot 2nd Ray Amputation;  Surgeon: Newt Minion, MD;  Location: [place];  Service: Orthopedics;  Laterality: Right;  Right Foot 2nd Ray Amputation   AMPUTATION Right 10/14/2012   Procedure: AMPUTATION FOOT;  Surgeon: Newt Minion, MD;  Location: [place];  Service: Orthopedics;  Laterality: Right;  Right Foot Transmetatarsal Amputation   AMPUTATION Left 09/23/2014   Procedure: Left Foot Transmetatarsal Amputation;  Surgeon: Newt Minion, MD;  Location: [place];  Service: Orthopedics;  Laterality: Left;   AMPUTATION Left 11/22/2020   Procedure: LEFT BELOW KNEE AMPUTATION;  Surgeon: Newt Minion, MD;  Location: [place];  Service: Orthopedics;  Laterality: Left;   BACK SURGERY     lower   CARPAL TUNNEL RELEASE Right 01/30/2018   Procedure: RIGHT CARPAL TUNNEL RELEASE;  Surgeon: Newt Minion, MD;  Location: [place];  Service: Orthopedics;  Laterality: Right;   CIRCUMCISION  2010   COLONOSCOPY     FOOT FRACTURE SURGERY Left 1970's   "broke it playing football" (10/12/2012)   HUMERUS FRACTURE SURGERY W/ IMPLANT Right 1960's   "  put a plate in it" (0/53/9767)   I & D EXTREMITY Right 03/24/2020   Procedure: EXCISION RIGHT MEDIAL CUNEFORM;  Surgeon: Newt Minion, MD;  Location: [place];  Service: Orthopedics;  Laterality: Right;   INCISION AND DRAINAGE OF WOUND Left 2006   "foot" (10/12/2012)   [place] SURGERY  2008  :   Current Outpatient Medications:    atorvastatin (LIPITOR) 80 MG tablet, Take 80 mg by mouth daily., Disp: , Rfl:    brimonidine (ALPHAGAN) 0.2 % ophthalmic solution, Place 1 drop into the right eye 2 (two) times daily., Disp: , Rfl:    clopidogrel (PLAVIX) 75 MG tablet, Take 1 tablet (75  mg total) by mouth daily., Disp: 30 tablet, Rfl: 0   cycloSPORINE (RESTASIS) 0.05 % ophthalmic emulsion, Place 1 drop into both eyes 2 (two) times daily., Disp: , Rfl:    Dorzolamide HCl-Timolol Mal PF 2-0.5 % SOLN, Place 1 drop into both eyes 2 (two) times daily., Disp: , Rfl:    dorzolamidel-timolol (COSOPT) 22.3-6.8 MG/ML SOLN ophthalmic solution, Place 1 drop into the right eye 2 (two) times daily., Disp: , Rfl:    gabapentin (NEURONTIN) 300 MG capsule, Take 1 capsule (300 mg total) by mouth at bedtime., Disp: 90 capsule, Rfl: 3   hydroxypropyl methylcellulose / hypromellose (ISOPTO TEARS / GONIOVISC) 2.5 % ophthalmic solution, Place 1 drop into both eyes 3 (three) times daily as needed for dry eyes., Disp: 15 mL, Rfl: 12   ketorolac (ACULAR) 0.5 % ophthalmic solution, Place 1 drop into both eyes daily., Disp: , Rfl:    oxyCODONE-acetaminophen (PERCOCET/ROXICET) 5-325 MG tablet, Take 1 tablet by mouth every 6 (six) hours as needed for severe pain., Disp: 15 tablet, Rfl: 0:  No Known Allergies:   Family History  Problem Relation Age of Onset   Diabetes Mellitus II Other    Anesthesia problems Neg Hx   :   Social History   Socioeconomic History   Marital status: Single    Spouse name: Not on file   Number of children: 1   Years of education: 9   Highest education level: 9th grade  Occupational History   Not on file  Tobacco Use   Smoking status: Every Day    Types: Pipe    Passive exposure: Current   Smokeless tobacco: Never   Tobacco comments:    Uses 2-3 barrels a day.  Vaping Use   Vaping Use: Never used  Substance and Sexual Activity   Alcohol use: No    Alcohol/week: 0.0 standard drinks   Drug use: No   Sexual activity: Not Currently  Other Topics Concern   Not on file  Social History Narrative   Lives alone in an apartment on the first floor.  Has one daughter.  Retired Games developer.  Education: 12th grade.    Social Determinants of Health   Financial  Resource Strain: Low Risk    Difficulty of Paying Living Expenses: Not very hard  Food Insecurity: No Food Insecurity   Worried About Charity fundraiser in the Last Year: Never true   Ran Out of Food in the Last Year: Never true  Transportation Needs: No Transportation Needs   Lack of Transportation (Medical): No   Lack of Transportation (Non-Medical): No  Physical Activity: Inactive   Days of Exercise per Week: 0 days   Minutes of Exercise per Session: 0 min  Stress: No Stress Concern Present   Feeling of Stress : Only a little  Social Connections: Unknown   Frequency of Communication with Friends and Family: More than three times a week   Frequency of Social Gatherings with Friends and Family: More than three times a week   Attends Religious Services: 1 to 4 times per year   Active Member of [employer] or Organizations: No   Attends Archivist Meetings: Never   Marital Status: Not on file  Intimate Partner Violence: Not At Risk   Fear of Current or Ex-Partner: No   Emotionally Abused: No   Physically Abused: No   Sexually Abused: No  :  Pertinent items are noted in HPI.  Exam: Blood pressure 118/69, pulse 80, temperature 98.6 F (37 C), temperature source Temporal, resp. rate 18, height 6\' 1"  (1.854 m), SpO2 100 %. ECOG 2 General appearance: alert and cooperative appeared without distress. Head: atraumatic without any abnormalities. Eyes: conjunctivae/corneas clear. PERRL.  Sclera anicteric. Throat: lips, mucosa, and tongue normal; without oral thrush or ulcers. Resp: clear to auscultation bilaterally without rhonchi, wheezes or dullness to percussion. Cardio: regular rate and rhythm, S1, S2 normal, no murmur, click, rub or gallop GI: soft, non-tender; bowel sounds normal; no masses,  no organomegaly Skin: Skin color, texture, turgor normal. No rashes or lesions Lymph nodes: Cervical, supraclavicular, and axillary nodes normal. Neurologic: Grossly normal without any  motor, sensory or deep tendon reflexes. Musculoskeletal: No joint deformity or effusion.    Assessment and Plan:    81 year old with:  1.  Castration-sensitive advanced prostate cancer with disease to the bone and lymphadenopathy diagnosed in December 2022.  His PSA was 410 and a Gleason score of 9.  He already started on androgen deprivation therapy under the care of Dr. Louis Meckel.  The natural course of this disease was reviewed at this time and treatment choices were discussed.  He understands androgen deprivation therapy remains the backbone to treat this disease.  Any treatment is palliative at this time.  Therapy escalation utilizing androgen receptor pathway inhibitors such as enzalutamide, abiraterone versus systemic chemotherapy or combination of the above.  Complications associated with this therapy including nausea, fatigue, hypertension and seizures associated with enzalutamide were reiterated.  After discussion today, he is agreeable to proceed with enzalutamide at 160 mg daily.  We will monitor his symptoms and toxicity and adjust the dosing if needed.  2.  Androgen deprivation: He will continue to receive that under the care of Dr. Louis Meckel.  We will schedule subsequent Eligard injection in the near future.  3.  Bone directed therapy: Recommended calcium and vitamin D supplements.  Delton See will be considered after obtaining dental clearance.    4.  Pain: No bone pain noted at this time.  Radiation therapy could be considered for palliative purposes.  5.  Follow-up: We will be in the immediate future to start Eligard and MD follow-up in 2 months.   60  minutes were dedicated to this visit. The time was spent on reviewing laboratory data, imaging studies, discussing treatment options, and answering questions regarding future plan.    A copy of this consult has been forwarded to the requesting physician.

## 2021-06-28 NOTE — Telephone Encounter (Signed)
Oral Oncology Patient Advocate Encounter  Prior Authorization for [facility] has been approved.    PA# [id number]   Effective dates: 06/28/21 through 06/16/22  Patients co-pay is $0  Oral Oncology Clinic will continue to follow.   [place] [facility] Phone [phone] Fax [phone] 06/28/2021 3:39 PM

## 2021-06-28 NOTE — Therapy (Signed)
[facility] [address] [place], [place], [address] Phone: [phone]   Fax:  [phone]  Physical Therapy Treatment  Patient Details  Name: [name] MRN: [id number] Date of Birth: 1941/05/26 Referring Provider (PT): Meridee Score, MD   Encounter Date: 06/28/2021   PT End of Session - 06/28/21 0825     Visit Number 14    Number of Visits 25    Date for PT Re-Evaluation 08/02/21    Authorization Type [employer] Medicare    Progress Note Due on Visit 20    PT Start Time 0800    PT Stop Time 0840    PT Time Calculation (min) 40 min    Equipment Utilized During Treatment Gait belt    Activity Tolerance Patient tolerated treatment well;Patient limited by fatigue    Behavior During Therapy WFL for tasks assessed/performed             Past Medical History:  Diagnosis Date   Arthritis    Denies   Glaucoma    High cholesterol    Hypertension    denies   Osteomyelitis ([place])    Right great toe   Peripheral vascular disease ([place])    diabetic  with osteomylitis   Pre-diabetes    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    Past Surgical History:  Procedure Laterality Date   AMPUTATION  07/23/2011   Procedure: AMPUTATION DIGIT;  Surgeon: Newt Minion, MD;  Location: [place];  Service: Orthopedics;  Laterality: Right;  Right Great Toe Amputation   AMPUTATION Right 09/18/2012   Procedure: Right Foot 2nd Ray Amputation;  Surgeon: Newt Minion, MD;  Location: [place];  Service: Orthopedics;  Laterality: Right;  Right Foot 2nd Ray Amputation   AMPUTATION Right 10/14/2012   Procedure: AMPUTATION FOOT;  Surgeon: Newt Minion, MD;  Location: [place];  Service: Orthopedics;  Laterality: Right;  Right Foot Transmetatarsal Amputation   AMPUTATION Left 09/23/2014   Procedure: Left Foot Transmetatarsal Amputation;  Surgeon: Newt Minion, MD;  Location: [place];  Service: Orthopedics;  Laterality: Left;   AMPUTATION Left 11/22/2020   Procedure: LEFT BELOW KNEE AMPUTATION;   Surgeon: Newt Minion, MD;  Location: [place];  Service: Orthopedics;  Laterality: Left;   BACK SURGERY     lower   CARPAL TUNNEL RELEASE Right 01/30/2018   Procedure: RIGHT CARPAL TUNNEL RELEASE;  Surgeon: Newt Minion, MD;  Location: [place];  Service: Orthopedics;  Laterality: Right;   CIRCUMCISION  2010   COLONOSCOPY     FOOT FRACTURE SURGERY Left 1970's   "broke it playing football" (10/12/2012)   HUMERUS FRACTURE SURGERY W/ IMPLANT Right 1960's   "put a plate in it" (0/94/7096)   I & D EXTREMITY Right 03/24/2020   Procedure: EXCISION RIGHT MEDIAL CUNEFORM;  Surgeon: Newt Minion, MD;  Location: [place];  Service: Orthopedics;  Laterality: Right;   INCISION AND DRAINAGE OF WOUND Left 2006   "foot" (10/12/2012)   [place] SURGERY  2008    There were no vitals filed for this visit.   Subjective Assessment - 06/28/21 0824     Subjective He still relays the pulling sensation in his prosthesis is better today. He relays he can get his prothesis on by himself but his niece disputes this.    Patient is accompained by: Family member   niece   Pertinent History arthritis, glaucoma, HTN, osteomyelitis, PVD, DM2, CVA cerebellum, right toe amputations, NSTEMI, gout    Patient Stated  Goals use prosthesis to go into community including church & fishing.              [place] Adult PT Treatment/Exercise - 06/28/21 0001       Transfers   Transfers Sit to Stand;Stand to Sit;Stand Pivot Transfers    Sit to Stand 4: Min assist;With upper extremity assist;With armrests;From chair/3-in-1;Other (comment);3: Mod assist   to locked rollator RW, MinA from w/c and ModA from chair without armrests   Sit to Stand Details Verbal cues for technique;Tactile cues for weight shifting;Manual facilitation for weight shifting;Verbal cues for safe use of DME/AE;Visual cues for safe use of DME/AE    Stand to Sit 4: Min guard;With upper extremity assist;With armrests;To chair/3-in-1;Other (comment);4: Min assist    from locked rollator RW, min guard w/c and minA chairs without armrests.   Stand to Sit Details (indicate cue type and reason) Verbal cues for technique;Manual facilitation for weight shifting;Verbal cues for safe use of DME/AE;Visual cues for safe use of DME/AE    Squat Pivot Transfers 5: Supervision   over armrests with prosthesis on limb to assist     Ambulation/Gait   Ambulation/Gait Yes    Ambulation/Gait Assistance 4: Min assist   PT providing verbal cues to [facility] and his niece providing visual cues in front for direction due to poor vision   Ambulation Distance (Feet) 200 Feet   X1, then 75 X2   Assistive device Rolling walker;Prosthesis    Ramp 3: Mod assist   with RW and prothesis he had difficulty with Lt foot clearance ascending needing cueing and assistance   Curb 4: Min assist   RW and prothesis,and cues for walker placement and technique     Neuro Re-ed    Neuro Re-ed Details  balance on airex pad with feet apart X 2 min, balance on flat surface feet narrow X 1 min then feet apart and head turns X1 min. Walking in bars with one UE support reduced to taking a few steps without UE support but needs min to mod A with this.      Prosthetics   Prosthetic Care Comments  PT recommended increasing wear to most of awake hours    Current prosthetic wear tolerance (#hours/day)  reports up to 6 hrs 1x/day and only on therapy days per niece report                       PT Short Term Goals - 06/28/21 0826       PT SHORT TERM GOAL #1   Title Patient donnes prosthesis without cues or physical assist.    Baseline still requires assistance due to poor vision and demenita    Time 4    Period Weeks    Status On-going    Target Date 07/05/21      PT SHORT TERM GOAL #2   Title Patient tolerates prosthesis >8 hrs total /day without skin issues or limb pain    Baseline wears it about 6 hours on therapy days and not much on non therapy days per niece report    Time 4     Period Weeks    Status On-going    Target Date 07/05/21      PT SHORT TERM GOAL #3   Title Patient able to transfer sit/stand std chair height without armrests using UEs to push to RW safely.    Time 4    Period Weeks    Status Revised  Target Date 07/05/21      PT SHORT TERM GOAL #4   Title Patient ambulates 200' with RW & prosthesis with supervision.    Baseline up to 150' with RW and prosthesis with supervision    Time 4    Period Weeks    Status On-going    Target Date 07/05/21      PT SHORT TERM GOAL #5   Title .Patient negotiates ramps & curbs with RW & prosthesis with minA from niece with PT giving cues    Baseline mod A at times for balance to clear Lt leg stepping up onto curb    Time 4    Period Weeks    Status Revised    Target Date 07/05/21               PT Long Term Goals - 06/04/21 0842       PT LONG TERM GOAL #1   Title Patient & family are independent with prosthetic care and pt able to donne prosthesis modified independent.    Time 12    Period Weeks    Status On-going    Target Date 08/02/21      PT LONG TERM GOAL #2   Title Patient tolerates prosthesis wear >90% of awake hours without skin or limb pain issues.    Time 12    Period Weeks    Status On-going    Target Date 08/02/21      PT LONG TERM GOAL #3   Title Berg Balance >/= 36/56 with RW support to indicate lower fall risk    Time 12    Period Weeks    Status On-going    Target Date 08/02/21      PT LONG TERM GOAL #4   Title Patient ambulates 300' with RW & prosthesis modified independent.    Time 12    Period Weeks    Status On-going    Target Date 08/02/21      PT LONG TERM GOAL #5   Title Patient negotiates ramps, curbs with RW & stairs with single rail & prosthesis modified independent.    Time 12    Period Weeks    Status On-going    Target Date 08/02/21                   Plan - 06/28/21 7253     Clinical Impression Statement Less overall complaints  of pulling sensation so he had improved standing and ambulation performance today. After discusssion with niece he is not walking any at home or if any its very little and his RW was gifted to him and is not tall enough. I sent a telephone encounter in epic to Dr Sharol Given to request a tall adult RW prescription which would allow him to ambulate more at home and would augment his overall functional progress. He was able to meet his STG for ambulation today with tall RW we have in clinic.    Personal Factors and Comorbidities Comorbidity 3+;Past/Current Experience;Other   lives alone   Comorbidities arthritis, glaucoma, HTN, osteomyelitis, PVD, DM2, CVA cerebellum, right transmetatarsal amputation, NSTEMI, gout    Examination-Activity Limitations Locomotion Level;Reach Overhead;Stairs;Stand;Toileting;Transfers    Examination-Participation Restrictions Community Activity    Stability/Clinical Decision Making Evolving/Moderate complexity    Rehab Potential Good    PT Frequency 2x / week    PT Duration 12 weeks    PT Treatment/Interventions ADLs/Self Care Home Management;DME Instruction;Gait training;Stair training;Functional mobility training;Therapeutic activities;Therapeutic exercise;Balance  training;Neuromuscular re-education;Patient/family education;Prosthetic Training;Manual techniques;Passive range of motion;Vestibular    PT Next Visit Plan work towards updated STGs, continue review prosthetic care, prosthetic gait with RW, standing balance activities, discuss rollator vs std RW with pt & niece    Consulted and Agree with Plan of Care Patient;Family member/caregiver    Family Member Consulted neice, Arlana Lindau             Patient will benefit from skilled therapeutic intervention in order to improve the following deficits and impairments:  Abnormal gait, Decreased activity tolerance, Decreased balance, Decreased cognition, Decreased endurance, Decreased knowledge of use of DME, Decreased  mobility, Decreased range of motion, Decreased strength, Impaired flexibility, Postural dysfunction, Prosthetic Dependency  Visit Diagnosis: Unsteadiness on feet  Other abnormalities of gait and mobility  Muscle weakness (generalized)  Stiffness of left knee, not elsewhere classified  Abnormal posture     Problem List Patient Active Problem List   Diagnosis Date Noted   Abscess of bursa of left ankle 11/22/2020   Subacute osteomyelitis, left ankle and foot ([place])    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[place]) 09/23/2014   Osteomyelitis of ankle or foot, left, acute ([place]) 08/23/2014   Cellulitis 10/12/2012   Chest pain 10/12/2012   Tobacco abuse 10/12/2012   ABSCESS, AXILLA, LEFT 02/23/2008   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   CHEST WALL PAIN, ACUTE 12/03/2007   CANDIDIASIS, GLANS PENIS 09/16/2007   HEADACHE 08/04/2007   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

## 2021-06-28 NOTE — Telephone Encounter (Signed)
Oral Oncology Patient Advocate Encounter   Received notification from [employer] that prior authorization for [name] is required.   PA submitted on CoverMyMeds Key [id number] Status is pending   Oral Oncology Clinic will continue to follow.   [facility] [place] Phone [phone] Fax [phone] 06/28/2021 3:08 PM

## 2021-06-28 NOTE — Progress Notes (Signed)
Introduced myself to the patient, and his niece, as the prostate nurse navigator.  No barriers to care identified at this time.  He is here to discuss his treatment options.  I gave his niece my business card and asked him to call me with questions or concerns.  Verbalized understanding.

## 2021-06-28 NOTE — Telephone Encounter (Signed)
Dr. Sharol Given can we get [name] (MRN: [id number]) a prescription for a tall adult rolling walker with 5 inch fixed wheels please so he can ambulate more at home.  Thank you,  Elsie Ra, PT, DPT 06/28/21 8:15 AM

## 2021-06-29 ENCOUNTER — Other Ambulatory Visit (HOSPITAL_COMMUNITY): Payer: Self-pay

## 2021-06-29 NOTE — Telephone Encounter (Signed)
Oral Chemotherapy Pharmacist Encounter  I spoke with patient for overview of: Xtandi for the treatment of advanced, castration-sensitive prostate cancer in conjunction with firmagon, planned duration until disease progression or unacceptable toxicity.   Counseled patient on administration, dosing, side effects, monitoring, drug-food interactions, safe handling, storage, and disposal.  Patient will take Xtandi 40mg  capsules, 4 capsules (160mg ) by mouth once daily without regard to food.  Xtandi start date: 06/30/2021  Adverse effects include but are not limited to: peripheral edema, GI upset, hypertension, hot flashes, fatigue, falls/fractures, and arthralgias.   Patient instructed about small risk of seizures with Xtandi treatment.  Reviewed with patient importance of keeping a medication schedule and plan for any missed doses. No barriers to medication adherence identified.  Medication reconciliation performed and medication/allergy list updated.  Insurance authorization for Gillermina Phy has been obtained. Test claim at the pharmacy revealed copayment $0 for 1st fill of 30 days. This will ship from the [facility] on 07/02/2021 to deliver to patient's home on 07/03/2021.  Patient informed the pharmacy will reach out 5-7 days prior to needing next fill of Xtandi to coordinate continued medication acquisition to prevent break in therapy.  All questions answered.  Mr. Arcidiacono and niece [name] voiced understanding and appreciation.   Medication education handout placed in mail for patient. Patient knows to call the office with questions or concerns. Oral Chemotherapy Clinic phone number provided to patient.   Drema Halon, PharmD Hematology/Oncology Clinical Pharmacist [facility] [phone] 06/29/2021   11:05 AM

## 2021-06-29 NOTE — Telephone Encounter (Signed)
Oral Oncology Pharmacist Encounter  Received new prescription for enzalutamide Gillermina Phy) for the treatment of advanced prostate cancer, planned duration until disease progression or unacceptable toxicity.  Labs from 03/13/21 assessed, no interventions needed. Prescription dose and frequency assessed.  Current medication list in Epic reviewed, DDIs with xtandi identified: - atorvastatin and percocet efficacy may be decreased while on xtandi, will need to be monitored - clopidogrel may increase the toxicities of enzalutamide (moderate CYP2C8 inhibitor), will monitor.  Evaluated chart and no patient barriers to medication adherence noted.   Patient agreement for treatment documented in MD note on 06/28/21.  Prescription has been e-scribed to the [facility] for benefits analysis and approval.  Oral Oncology Clinic will continue to follow for insurance authorization, copayment issues, initial counseling and start date.  Drema Halon, PharmD Hematology/Oncology Clinical Pharmacist [facility] [phone] 06/29/2021 10:55 AM

## 2021-07-03 ENCOUNTER — Encounter: Payer: Medicare Other | Admitting: Physical Therapy

## 2021-07-03 ENCOUNTER — Telehealth: Payer: Self-pay | Admitting: Oncology

## 2021-07-03 NOTE — Telephone Encounter (Signed)
Scheduled per 01/16 los, patient has been called and notified of upcoming appointments. 

## 2021-07-05 ENCOUNTER — Encounter: Payer: Medicare Other | Admitting: Physical Therapy

## 2021-07-05 ENCOUNTER — Inpatient Hospital Stay: Payer: Medicare Other

## 2021-07-09 ENCOUNTER — Encounter: Payer: Self-pay | Admitting: Oncology

## 2021-07-09 ENCOUNTER — Other Ambulatory Visit: Payer: Self-pay

## 2021-07-09 ENCOUNTER — Inpatient Hospital Stay: Payer: Medicare Other

## 2021-07-09 VITALS — BP 136/79 | HR 76 | Temp 98.4°F | Resp 18

## 2021-07-09 DIAGNOSIS — F17210 Nicotine dependence, cigarettes, uncomplicated: Secondary | ICD-10-CM | POA: Diagnosis not present

## 2021-07-09 DIAGNOSIS — Z7901 Long term (current) use of anticoagulants: Secondary | ICD-10-CM | POA: Diagnosis not present

## 2021-07-09 DIAGNOSIS — I10 Essential (primary) hypertension: Secondary | ICD-10-CM | POA: Diagnosis not present

## 2021-07-09 DIAGNOSIS — C61 Malignant neoplasm of prostate: Secondary | ICD-10-CM | POA: Diagnosis present

## 2021-07-09 DIAGNOSIS — Z79899 Other long term (current) drug therapy: Secondary | ICD-10-CM | POA: Diagnosis not present

## 2021-07-09 DIAGNOSIS — C7951 Secondary malignant neoplasm of bone: Secondary | ICD-10-CM | POA: Diagnosis not present

## 2021-07-09 MED ORDER — LEUPROLIDE ACETATE (4 MONTH) 30 MG ~~LOC~~ KIT
30.0000 mg | PACK | Freq: Once | SUBCUTANEOUS | Status: AC
Start: 2021-07-09 — End: 2021-07-09
  Administered 2021-07-09: 30 mg via SUBCUTANEOUS
  Filled 2021-07-09: qty 30

## 2021-07-09 MED ORDER — LEUPROLIDE ACETATE (4 MONTH) 30 MG IM KIT
30.0000 mg | PACK | Freq: Once | INTRAMUSCULAR | Status: DC
Start: 2021-07-09 — End: 2021-07-09
  Filled 2021-07-09: qty 30

## 2021-07-09 MED ORDER — LEUPROLIDE ACETATE (3 MONTH) 22.5 MG IM KIT: 22.5000 mg | PACK | Freq: Once | INTRAMUSCULAR | Status: DC

## 2021-07-09 MED ORDER — LEUPROLIDE ACETATE 7.5 MG IM KIT: 7.5000 mg | PACK | Freq: Once | INTRAMUSCULAR | Status: DC

## 2021-07-09 NOTE — Progress Notes (Signed)
Met with patient's accompanying adult to introduce myself as Arboriculturist and to offer available resources.  Discussed one-time $1000 Radio broadcast assistant to assist with personal expenses while going through treatment.  Gave my card if interested in applying and for any additional financial questions or concerns.

## 2021-07-10 ENCOUNTER — Other Ambulatory Visit (HOSPITAL_COMMUNITY): Payer: Self-pay

## 2021-07-10 ENCOUNTER — Ambulatory Visit: Payer: Medicare Other | Admitting: *Deleted

## 2021-07-10 ENCOUNTER — Ambulatory Visit (INDEPENDENT_AMBULATORY_CARE_PROVIDER_SITE_OTHER): Payer: Medicare Other | Admitting: Physical Therapy

## 2021-07-10 ENCOUNTER — Other Ambulatory Visit: Payer: Self-pay | Admitting: *Deleted

## 2021-07-10 DIAGNOSIS — M6281 Muscle weakness (generalized): Secondary | ICD-10-CM

## 2021-07-10 DIAGNOSIS — R293 Abnormal posture: Secondary | ICD-10-CM | POA: Diagnosis not present

## 2021-07-10 DIAGNOSIS — R2681 Unsteadiness on feet: Secondary | ICD-10-CM

## 2021-07-10 DIAGNOSIS — M25662 Stiffness of left knee, not elsewhere classified: Secondary | ICD-10-CM | POA: Diagnosis not present

## 2021-07-10 DIAGNOSIS — R2689 Other abnormalities of gait and mobility: Secondary | ICD-10-CM

## 2021-07-10 NOTE — Patient Outreach (Signed)
[facility]) Care Management  [place]  07/10/2021   [name] 08-13-1940 [id number]   Outgoing call placed to niece, successful.  He is refusing to take oral agents for prostate cancer, she will continue to encourage adherence as well as cancer center.  Next follow up in March.  She report he has continued to do well with ortho rehab for prosthetic leg.  She is working on getting in home aide services as well as resources for the blind in the home to help with learning to maneuver in the home.    Confirms that she has been contacted by case manager at PCP office, had not returned call.  Encouraged to do so, explained that they will be managing member's case going forward.  Denies any urgent concerns, encouraged to contact this care manager with questions.    Encounter Medications:  Outpatient Encounter Medications as of 07/10/2021  Medication Sig Note   atorvastatin (LIPITOR) 80 MG tablet Take 80 mg by mouth daily.    brimonidine (ALPHAGAN) 0.2 % ophthalmic solution Place 1 drop into the right eye 2 (two) times daily.    clopidogrel (PLAVIX) 75 MG tablet Take 1 tablet (75 mg total) by mouth daily. 11/22/2020: Not clear when he took last.  Per Jan, RN note may not be taking his medications as ordered.   cycloSPORINE (RESTASIS) 0.05 % ophthalmic emulsion Place 1 drop into both eyes 2 (two) times daily.    Dorzolamide HCl-Timolol Mal PF 2-0.5 % SOLN Place 1 drop into both eyes 2 (two) times daily.    dorzolamidel-timolol (COSOPT) 22.3-6.8 MG/ML SOLN ophthalmic solution Place 1 drop into the right eye 2 (two) times daily.    enzalutamide (XTANDI) 40 MG capsule Take 4 capsules (160 mg total) by mouth daily.    gabapentin (NEURONTIN) 300 MG capsule Take 1 capsule (300 mg total) by mouth at bedtime.    hydroxypropyl methylcellulose / hypromellose (ISOPTO TEARS / GONIOVISC) 2.5 % ophthalmic solution Place 1 drop into both eyes 3 (three) times daily as needed for dry eyes.     ketorolac (ACULAR) 0.5 % ophthalmic solution Place 1 drop into both eyes daily.    oxyCODONE-acetaminophen (PERCOCET/ROXICET) 5-325 MG tablet Take 1 tablet by mouth every 6 (six) hours as needed for severe pain.    No facility-administered encounter medications on file as of 07/10/2021.    Functional Status:  In your present state of health, do you have any difficulty performing the following activities: 05/21/2021 11/22/2020  Hearing? N -  Vision? N -  Difficulty concentrating or making decisions? N -  Walking or climbing stairs? Y -  Comment Non-ambulatory -  Dressing or bathing? N -  Doing errands, shopping? Y N  Comment Non-ambulatory -  Conservation officer, nature and eating ? N -  Using the Toilet? N -  In the past six months, have you accidently leaked urine? Y -  Comment Occasional Urinary Incontinence -  Do you have problems with loss of bowel control? N -  Managing your Medications? Y -  Comment Niece - [name] -  Managing your Finances? Y -  Comment Niece - [name] -  Housekeeping or managing your Housekeeping? Y -  Comment Niece - [name] -  Some recent data might be hidden    Fall/Depression Screening: Fall Risk  05/21/2021 11/24/2020 11/11/2018  Falls in the past year? 1 0 0  Number falls in past yr: 0 0 0  Injury with  Fall? 1 0 0  Risk for fall due to : History of fall(s);Impaired balance/gait;Impaired mobility Impaired balance/gait;Orthopedic patient -  Follow up Falls evaluation completed;Education provided;Falls prevention discussed - Falls evaluation completed   PHQ 2/9 Scores 05/21/2021 11/24/2020 09/06/2014 08/23/2014 08/23/2014 08/23/2014  PHQ - 2 Score 0 0 0 0 0 0  Exception Documentation Other- indicate reason in comment box - - - - -  Not completed Verified by Niece - Rita Tisdale - - - - -    Assessment:   Care Plan Care Plan : General Plan of Care (Adult)  Updates made by Valente David, RN since 07/10/2021 12:00 AM     Problem:  Risk for hospital admission related to noncompliance and knowledge defecit regarding management of medical conditions   Priority: High     Long-Range Goal: Patient will show appropriate management of chronic medical conditions evidenced by no hospital admissions in the next 6 months Completed 07/10/2021  Start Date: 04/05/2021  Expected End Date: 10/02/2021  This Visit's Progress: On track  Recent Progress: On track  Priority: High  Note:   Current Barriers:  Knowledge Deficits related to plan of care for management of DMII and osteomyelitis with amputation Chronic Disease Management support and education needs related to DMII and osteomyelitis with amputation Non-adherence to prescribed medication regimen  RNCM Clinical Goal(s):  Patient will verbalize understanding of plan for management of DMII and BKA take all medications exactly as prescribed and will call provider for medication related questions attend all scheduled medical appointments: Dr. Sharol Given work with Home health PT to increase strength and learn mobility on new prosthetic not experience hospital admission. Hospital Admissions in last 6 months = 1  through collaboration with RN Care manager, provider, and care team.   Interventions: 1:1 collaboration with primary care provider regarding development and update of comprehensive plan of care as evidenced by provider attestation and co-signature Inter-disciplinary care team collaboration (see longitudinal plan of care) Evaluation of current treatment plan related to  self management and patient's adherence to plan as established by provider  Amputation with new prosthesisGoal on track:  Yes Evaluation of current treatment plan related to DMII and BKA , ADL IADL limitations and Inability to perform ADL's independently self-management and patient's adherence to plan as established by provider. Discussed plans with patient for ongoing care management follow up and provided patient  with direct contact information for care management team Reviewed medications with patient and discussed importance of taking in effort to decrease risk of admission; Collaborated with Dr. Sharol Given and [name] regarding HHPT; Reviewed scheduled/upcoming provider appointments including; Screening for signs and symptoms of depression related to chronic disease state;   Patient Goals/Self-Care Activities: Patient will self administer medications as prescribed Patient will attend all scheduled provider appointments Patient will continue to perform ADL's independently Patient will continue to perform IADL's independently  Follow Up Plan:  No further follow up required: Active with external case management program as of 1/24   Update 11/18 - Per niece, member still has not started PT for new prosthetic leg.  There was a request to have in home PT as member has not been able to get himself up, dressed, and out of the door to a transport vehicle independently, however family was notified that home PT is not suitable for prosthesis.  Member has appointment scheduled for next week to have evaluation for outpatient PT but there is still the barrier of member not having anyone in the home to help get him  ready for visits.  He has continued to decline offers for ALF or adult day programs such as [facility], but per niece, he is willing to have personal care aide come into the home.  She state he now has Medicaid, agrees to have referral to CSW for assistance.    She also report that member was seen by urology and told that there is concern for prostate cancer, will need a biopsy.  She has been trying to get appointment scheduled however she was notified that they no longer serve Medicaid patients.  She will contact a different urology office within the next week to schedule office visit and biopsy.     Update 11/30 - Niece report member has been able to start outpatient PT sessions, she is still working with him on  compliance issues.  Advised that CSW would be reaching out to her to discuss process for PCS.  Notified that member does not have appointment with urology office, will have appointment and biopsy on Monday 12/5.  Update 12/7 - Spoke with niece, confirms member had biopsy on Monday and received results today, positive for cancer.  He has follow upon 12/16 to discuss treatment options, she is not confident that he will agree to treatment as he has said in the past that he does not want to see any more doctors.  She will encourage him to listen to options and make final decision.  Meanwhile, she and her sister will both work to help get member PCS started for additional help in the home.   Update 12/28 - Per niece, member has received his first "shot" for prostate cancer, she was unable to verbalize what it actually was.  State they are waiting on a call from an assigned oncologist to develop treatment plan. She remains interested in having member receive PCS, aware that CSW will make contact with her today regarding progress of application.  Overall, state member is doing better, doing to PT for prosthetic therapy twice a week, appetite has increased.     Update 1/24 - Niece report member has been doing well with his cancer treatment however is refusing to take the oral agents.          Plan:  Follow-up: Patient requests no follow-up at this time..  Will close case at this time.  Valente David, [place], MSN [place] [phone]

## 2021-07-10 NOTE — Therapy (Signed)
[facility] Physical Therapy [address] [place], [place], [address] Phone: [phone]   Fax:  [phone]  Physical Therapy Treatment  Patient Details  Name: [name] MRN: [id number] Date of Birth: 1940-12-07 Referring Provider (PT): Meridee Score, MD   Encounter Date: 07/10/2021   PT End of Session - 07/10/21 0857     Visit Number 15    Number of Visits 25    Date for PT Re-Evaluation 08/02/21    Authorization Type UHC Medicare    Progress Note Due on Visit 20    PT Start Time 0800    PT Stop Time 0844    PT Time Calculation (min) 44 min    Equipment Utilized During Treatment Gait belt    Activity Tolerance Patient tolerated treatment well;Patient limited by fatigue    Behavior During Therapy [facility] for tasks assessed/performed             Past Medical History:  Diagnosis Date   Arthritis    Denies   Glaucoma    High cholesterol    Hypertension    denies   Osteomyelitis ([place])    Right great toe   Peripheral vascular disease ([place])    diabetic  with osteomylitis   Pre-diabetes    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    Past Surgical History:  Procedure Laterality Date   AMPUTATION  07/23/2011   Procedure: AMPUTATION DIGIT;  Surgeon: Newt Minion, MD;  Location: [place];  Service: Orthopedics;  Laterality: Right;  Right Great Toe Amputation   AMPUTATION Right 09/18/2012   Procedure: Right Foot 2nd Ray Amputation;  Surgeon: Newt Minion, MD;  Location: [place];  Service: Orthopedics;  Laterality: Right;  Right Foot 2nd Ray Amputation   AMPUTATION Right 10/14/2012   Procedure: AMPUTATION FOOT;  Surgeon: Newt Minion, MD;  Location: [place];  Service: Orthopedics;  Laterality: Right;  Right Foot Transmetatarsal Amputation   AMPUTATION Left 09/23/2014   Procedure: Left Foot Transmetatarsal Amputation;  Surgeon: Newt Minion, MD;  Location: [place];  Service: Orthopedics;  Laterality: Left;   AMPUTATION Left 11/22/2020   Procedure: LEFT BELOW KNEE AMPUTATION;   Surgeon: Newt Minion, MD;  Location: [place];  Service: Orthopedics;  Laterality: Left;   BACK SURGERY     lower   CARPAL TUNNEL RELEASE Right 01/30/2018   Procedure: RIGHT CARPAL TUNNEL RELEASE;  Surgeon: Newt Minion, MD;  Location: [place];  Service: Orthopedics;  Laterality: Right;   CIRCUMCISION  2010   COLONOSCOPY     FOOT FRACTURE SURGERY Left 1970's   "broke it playing football" (10/12/2012)   HUMERUS FRACTURE SURGERY W/ IMPLANT Right 1960's   "put a plate in it" (8/76/8115)   I & D EXTREMITY Right 03/24/2020   Procedure: EXCISION RIGHT MEDIAL CUNEFORM;  Surgeon: Newt Minion, MD;  Location: [place];  Service: Orthopedics;  Laterality: Right;   INCISION AND DRAINAGE OF WOUND Left 2006   "foot" (10/12/2012)   [place] SURGERY  2008    There were no vitals filed for this visit.   Subjective Assessment - 07/10/21 0857     Subjective He denies pain or issues with his prosthesis today.    Patient is accompained by: Family member   niece   Pertinent History arthritis, glaucoma, HTN, osteomyelitis, PVD, DM2, CVA cerebellum, right toe amputations, NSTEMI, gout    Patient Stated Goals use prosthesis to go into community including church & fishing.  [place] Adult PT Treatment/Exercise - 07/10/21 0001       Transfers   Transfers Sit to Stand;Stand to Sit;Stand Pivot Transfers    Sit to Stand 4: Min assist;With upper extremity assist;With armrests;From chair/3-in-1;Other (comment);3: Mod assist   to locked rollator RW, MinA from w/c and ModA from chair without armrests   Sit to Stand Details Verbal cues for technique;Tactile cues for weight shifting;Manual facilitation for weight shifting;Verbal cues for safe use of DME/AE;Visual cues for safe use of DME/AE    Stand to Sit 4: Min guard;With upper extremity assist;With armrests;To chair/3-in-1;Other (comment);4: Min assist   from locked rollator RW, min guard w/c and minA chairs without armrests.   Stand to Sit Details  (indicate cue type and reason) Verbal cues for technique;Manual facilitation for weight shifting;Verbal cues for safe use of DME/AE;Visual cues for safe use of DME/AE    Squat Pivot Transfers 5: Supervision   over armrests with prosthesis on limb to assist     Ambulation/Gait   Ambulation/Gait Yes    Ambulation/Gait Assistance 4: Min assist   PT providing verbal cues to [facility] and his niece providing visual cues in front for direction due to poor vision   Ambulation Distance (Feet) 210 Feet   X1, then 75 X2   Assistive device Rolling walker;Prosthesis      Neuro Re-ed    Neuro Re-ed Details  balance on airex pad with feet apart X 1 min X2, balance on flat surface feet together  X 1 min X2 Walking in bars lateral and retro 3 round trips ea.      Knee/Hip Exercises: Aerobic   Nustep seat 14 L7 UE/LE X7 min      Knee/Hip Exercises: Machines for Strengthening   Total Gym Leg Press shuttle BLEs position 3 with 75# 15reps 3 sets, cues for full ROM      Knee/Hip Exercises: Standing   Forward Step Up Right;2 sets;5 reps;Hand Hold: 2;Step Height: 6"    Forward Step Up Limitations in bars      Knee/Hip Exercises: Seated   Sit to Sand 10 reps;with UE support   from 24 inch bar stool in bars                      PT Short Term Goals - 06/28/21 0826       PT SHORT TERM GOAL #1   Title Patient donnes prosthesis without cues or physical assist.    Baseline still requires assistance due to poor vision and demenita    Time 4    Period Weeks    Status On-going    Target Date 07/05/21      PT SHORT TERM GOAL #2   Title Patient tolerates prosthesis >8 hrs total /day without skin issues or limb pain    Baseline wears it about 6 hours on therapy days and not much on non therapy days per niece report    Time 4    Period Weeks    Status On-going    Target Date 07/05/21      PT SHORT TERM GOAL #3   Title Patient able to transfer sit/stand std chair height without armrests  using UEs to push to RW safely.    Time 4    Period Weeks    Status Revised    Target Date 07/05/21      PT SHORT TERM GOAL #4   Title Patient ambulates 200' with RW & prosthesis with supervision.  Baseline up to 150' with RW and prosthesis with supervision    Time 4    Period Weeks    Status On-going    Target Date 07/05/21      PT SHORT TERM GOAL #5   Title .Patient negotiates ramps & curbs with RW & prosthesis with minA from niece with PT giving cues    Baseline mod A at times for balance to clear Lt leg stepping up onto curb    Time 4    Period Weeks    Status Revised    Target Date 07/05/21               PT Long Term Goals - 06/04/21 0842       PT LONG TERM GOAL #1   Title Patient & family are independent with prosthetic care and pt able to donne prosthesis modified independent.    Time 12    Period Weeks    Status On-going    Target Date 08/02/21      PT LONG TERM GOAL #2   Title Patient tolerates prosthesis wear >90% of awake hours without skin or limb pain issues.    Time 12    Period Weeks    Status On-going    Target Date 08/02/21      PT LONG TERM GOAL #3   Title Berg Balance >/= 36/56 with RW support to indicate lower fall risk    Time 12    Period Weeks    Status On-going    Target Date 08/02/21      PT LONG TERM GOAL #4   Title Patient ambulates 300' with RW & prosthesis modified independent.    Time 12    Period Weeks    Status On-going    Target Date 08/02/21      PT LONG TERM GOAL #5   Title Patient negotiates ramps, curbs with RW & stairs with single rail & prosthesis modified independent.    Time 12    Period Weeks    Status On-going    Target Date 08/02/21                   Plan - 07/10/21 0859     Clinical Impression Statement He did not have complaints today with any pulling sensations of his prosthesis. He overall showed some improvements this week with his standing tolerance and functional endurance and has  increased his walking tolerance using RW. PT will continue to work to improve his overall functional level.    Personal Factors and Comorbidities Comorbidity 3+;Past/Current Experience;Other   lives alone   Comorbidities arthritis, glaucoma, HTN, osteomyelitis, PVD, DM2, CVA cerebellum, right transmetatarsal amputation, NSTEMI, gout    Examination-Activity Limitations Locomotion Level;Reach Overhead;Stairs;Stand;Toileting;Transfers    Examination-Participation Restrictions Community Activity    Stability/Clinical Decision Making Evolving/Moderate complexity    Rehab Potential Good    PT Frequency 2x / week    PT Duration 12 weeks    PT Treatment/Interventions ADLs/Self Care Home Management;DME Instruction;Gait training;Stair training;Functional mobility training;Therapeutic activities;Therapeutic exercise;Balance training;Neuromuscular re-education;Patient/family education;Prosthetic Training;Manual techniques;Passive range of motion;Vestibular    PT Next Visit Plan continue review prosthetic care, prosthetic gait with RW, standing balance activities, strength, and endurance    Consulted and Agree with Plan of Care Patient;Family member/caregiver    Family Member Consulted neice, Arlana [place]au             Patient will benefit from skilled therapeutic intervention in order to improve the following deficits  and impairments:  Abnormal gait, Decreased activity tolerance, Decreased balance, Decreased cognition, Decreased endurance, Decreased knowledge of use of DME, Decreased mobility, Decreased range of motion, Decreased strength, Impaired flexibility, Postural dysfunction, Prosthetic Dependency  Visit Diagnosis: Unsteadiness on feet  Other abnormalities of gait and mobility  Muscle weakness (generalized)  Stiffness of left knee, not elsewhere classified  Abnormal posture     Problem List Patient Active Problem List   Diagnosis Date Noted   Prostate cancer ([place]) 06/28/2021    Abscess of bursa of left ankle 11/22/2020   Subacute osteomyelitis, left ankle and foot ([place])    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) 10/31/2018   DM2 (diabetes mellitus, type 2) ([place]) 10/31/2018   NSTEMI (non-ST elevated myocardial infarction) ([place]) 05/28/2018   Syncope 05/28/2018   HTN (hypertension) 05/28/2018   Hyperlipemia, mixed 05/28/2018   Rash and nonspecific skin eruption 12/16/2017   Pain in right hand 09/11/2017   Carpal tunnel syndrome, right upper limb 07/01/2017   Arthritis of carpometacarpal [facility]) joint of left thumb 12/30/2016   Midfoot skin ulcer, right, limited to breakdown of skin ([place]) 07/22/2016   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[place]) 09/23/2014   Osteomyelitis of ankle or foot, left, acute ([place]) 08/23/2014   Cellulitis 10/12/2012   Chest pain 10/12/2012   Tobacco abuse 10/12/2012   ABSCESS, AXILLA, LEFT 02/23/2008   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   CHEST WALL PAIN, ACUTE 12/03/2007   CANDIDIASIS, GLANS PENIS 09/16/2007   HEADACHE 08/04/2007   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 ([place]) 03/05/2007   Other and unspecified hyperlipidemia 03/05/2007   GOUT 03/05/2007   ERECTILE DYSFUNCTION 03/05/2007   EXTERNAL HEMORRHOIDS 03/05/2007   VENTRAL HERNIA 03/05/2007   FATTY LIVER DISEASE 03/05/2007   Osteoarthritis 03/05/2007   HEMORRHOIDS, INTERNAL 03/17/2005   COLONIC POLYPS, HX OF 03/17/2005    Debbe Odea, PT,DPT 07/10/2021, 9:04 AM  [facility] Physical Therapy [address] [place], [place], [address] Phone: [phone]   Fax:   [phone]  Name: [name] MRN: [id number] Date of Birth: 1940/08/28

## 2021-07-12 ENCOUNTER — Other Ambulatory Visit: Payer: Self-pay

## 2021-07-12 ENCOUNTER — Ambulatory Visit (INDEPENDENT_AMBULATORY_CARE_PROVIDER_SITE_OTHER): Payer: Medicare Other | Admitting: Physical Therapy

## 2021-07-12 DIAGNOSIS — M6281 Muscle weakness (generalized): Secondary | ICD-10-CM

## 2021-07-12 DIAGNOSIS — M25662 Stiffness of left knee, not elsewhere classified: Secondary | ICD-10-CM | POA: Diagnosis not present

## 2021-07-12 DIAGNOSIS — R2689 Other abnormalities of gait and mobility: Secondary | ICD-10-CM

## 2021-07-12 DIAGNOSIS — R2681 Unsteadiness on feet: Secondary | ICD-10-CM | POA: Diagnosis not present

## 2021-07-12 DIAGNOSIS — R293 Abnormal posture: Secondary | ICD-10-CM

## 2021-07-12 NOTE — Therapy (Signed)
[facility] Physical Therapy [address] [place], [place], [address] Phone: [phone]   Fax:  [phone]  Physical Therapy Treatment  Patient Details  Name: [name] MRN: [id number] Date of Birth: Dec 27, 1940 Referring Provider (PT): Meridee Score, MD   Encounter Date: 07/12/2021   PT End of Session - 07/12/21 0837     Visit Number 16    Number of Visits 25    Date for PT Re-Evaluation 08/02/21    Authorization Type [employer] Medicare    Progress Note Due on Visit 20    PT Start Time 0800    PT Stop Time 0842    PT Time Calculation (min) 42 min    Equipment Utilized During Treatment Gait belt    Activity Tolerance Patient tolerated treatment well;Patient limited by fatigue    Behavior During Therapy [facility] for tasks assessed/performed             Past Medical History:  Diagnosis Date   Arthritis    Denies   Glaucoma    High cholesterol    Hypertension    denies   Osteomyelitis ([place])    Right great toe   Peripheral vascular disease ([place])    diabetic  with osteomylitis   Pre-diabetes    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;  Service: Orthopedics;  Laterality: Right;  Right Foot Transmetatarsal Amputation   AMPUTATION Left 09/23/2014   Procedure: Left Foot Transmetatarsal Amputation;  Surgeon: Newt Minion, MD;  Location: [place];  Service: Orthopedics;  Laterality: Left;   AMPUTATION Left 11/22/2020   Procedure: LEFT BELOW KNEE AMPUTATION;   Surgeon: Newt Minion, MD;  Location: [place];  Service: Orthopedics;  Laterality: Left;   BACK SURGERY     lower   CARPAL TUNNEL RELEASE Right 01/30/2018   Procedure: RIGHT CARPAL TUNNEL RELEASE;  Surgeon: Newt Minion, MD;  Location: [place];  Service: Orthopedics;  Laterality: Right;   CIRCUMCISION  2010   COLONOSCOPY     FOOT FRACTURE SURGERY Left 1970's   "broke it playing football" (10/12/2012)   HUMERUS FRACTURE SURGERY W/ IMPLANT Right 1960's   "put a plate in it" (3/41/9379)   I & D EXTREMITY Right 03/24/2020   Procedure: EXCISION RIGHT MEDIAL CUNEFORM;  Surgeon: Newt Minion, MD;  Location: [place];  Service: Orthopedics;  Laterality: Right;   INCISION AND DRAINAGE OF WOUND Left 2006   "foot" (10/12/2012)   [place] SURGERY  2008    There were no vitals filed for this visit.   Subjective Assessment - 07/12/21 0836     Subjective He relays more complaints today of his "prosthesis pulling on his pants legs"    Patient is accompained by: Family member   niece   Pertinent History arthritis, glaucoma, HTN, osteomyelitis, PVD, DM2, CVA cerebellum, right toe amputations, NSTEMI, gout    Patient Stated Goals use prosthesis to go into community including church & fishing.  Currently in Pain? No/denies              [facility] Adult PT Treatment/Exercise - 07/12/21 0001       Transfers   Transfers Sit to Stand;Stand to Sit;Stand Pivot Transfers    Sit to Stand 4: Min assist;With upper extremity assist;With armrests;From chair/3-in-1;Other (comment);3: Mod assist   to locked rollator RW, MinA from w/c and ModA from chair without armrests   Sit to Stand Details Verbal cues for technique;Tactile cues for weight shifting;Manual facilitation for weight shifting;Verbal cues for safe use of DME/AE;Visual cues for safe use of DME/AE    Stand to Sit 4: Min guard;With upper extremity assist;With armrests;To chair/3-in-1;Other (comment);4: Min assist   from locked rollator RW, min guard w/c  and minA chairs without armrests.   Stand to Sit Details (indicate cue type and reason) Verbal cues for technique;Manual facilitation for weight shifting;Verbal cues for safe use of DME/AE;Visual cues for safe use of DME/AE    Squat Pivot Transfers 5: Supervision   over armrests with prosthesis on limb to assist   Comments also worked on sit to stand from chair without arm rests with cues and demo to turn sideways and use chair back to assist push up and combined with RW able to do this with min A      Ambulation/Gait   Ambulation/Gait Yes    Ambulation/Gait Assistance 4: Min assist   PT providing verbal cues to [facility] and his niece providing visual cues in front for direction due to poor vision   Ambulation Distance (Feet) 300 Feet   then 50 X2   Assistive device Rolling walker;Prosthesis      Neuro Re-ed    Neuro Re-ed Details  balance with feet together and no UE support 1 min X 2 using intermit UE support      Knee/Hip Exercises: Machines for Strengthening   Total Gym Leg Press shuttle BLEs position 3 with 75# 15reps 3 sets, cues for full ROM, then Rt leg only 37#  X15  and Lt leg only 12# X15                       PT Short Term Goals - 07/12/21 0854       PT SHORT TERM GOAL #1   Title Patient donnes prosthesis without cues or physical assist.    Baseline still requires assistance due to poor vision and demenita, niece reports he does not wear it on non therapy days    Time 4    Period Weeks    Status On-going    Target Date 07/05/21      PT SHORT TERM GOAL #2   Title Patient tolerates prosthesis >8 hrs total /day without skin issues or limb pain    Baseline wears it about 6 hours on therapy days and not much on non therapy days per niece report    Time 4    Period Weeks    Status On-going    Target Date 07/05/21      PT SHORT TERM GOAL #3   Title Patient able to transfer sit/stand std chair height without armrests using UEs to push to RW safely.     Baseline met 1/26 with min A with this    Time 4    Period Weeks    Status Achieved    Target Date 07/05/21      PT SHORT TERM GOAL #4   Title Patient ambulates 200'  with RW & prosthesis with supervision.    Baseline up to 300' with RW and prosthesis with supervision mostly but does need verbal cues for obstable navigation due to poor visiion    Time 4    Period Weeks    Status Achieved    Target Date 07/05/21      PT SHORT TERM GOAL #5   Title .Patient negotiates ramps & curbs with RW & prosthesis with minA from niece with PT giving cues    Baseline mod A at times for balance to clear Lt leg stepping up onto curb    Time 4    Period Weeks    Status On-going    Target Date 07/05/21               PT Long Term Goals - 06/04/21 0842       PT LONG TERM GOAL #1   Title Patient & family are independent with prosthetic care and pt able to donne prosthesis modified independent.    Time 12    Period Weeks    Status On-going    Target Date 08/02/21      PT LONG TERM GOAL #2   Title Patient tolerates prosthesis wear >90% of awake hours without skin or limb pain issues.    Time 12    Period Weeks    Status On-going    Target Date 08/02/21      PT LONG TERM GOAL #3   Title Berg Balance >/= 36/56 with RW support to indicate lower fall risk    Time 12    Period Weeks    Status On-going    Target Date 08/02/21      PT LONG TERM GOAL #4   Title Patient ambulates 300' with RW & prosthesis modified independent.    Time 12    Period Weeks    Status On-going    Target Date 08/02/21      PT LONG TERM GOAL #5   Title Patient negotiates ramps, curbs with RW & stairs with single rail & prosthesis modified independent.    Time 12    Period Weeks    Status On-going    Target Date 08/02/21                   Plan - 07/12/21 0856     Clinical Impression Statement He has now met 2/5 short term PT goals. Progress has been limited by poor vision and dementia so he is  not wearing leg or doing much activity on non therapy days. He has however improved his overall endurance some and ability with transfers and max tolerated gait. We will continue to work to improve this as tolerated as he is still limited with these.    Personal Factors and Comorbidities Comorbidity 3+;Past/Current Experience;Other   lives alone   Comorbidities arthritis, glaucoma, HTN, osteomyelitis, PVD, DM2, CVA cerebellum, right transmetatarsal amputation, NSTEMI, gout    Examination-Activity Limitations Locomotion Level;Reach Overhead;Stairs;Stand;Toileting;Transfers    Examination-Participation Restrictions Community Activity    Stability/Clinical Decision Making Evolving/Moderate complexity    Rehab Potential Good    PT Frequency 2x / week    PT Duration 12 weeks    PT Treatment/Interventions ADLs/Self Care Home Management;DME Instruction;Gait training;Stair training;Functional mobility training;Therapeutic activities;Therapeutic exercise;Balance training;Neuromuscular re-education;Patient/family education;Prosthetic Training;Manual techniques;Passive range of motion;Vestibular    PT Next Visit Plan continue review prosthetic care, prosthetic gait with RW, standing balance activities, strength, and endurance    Consulted and Agree  with Plan of Care Patient;Family member/caregiver    Family Member Consulted neice, Arlana Lindau             Patient will benefit from skilled therapeutic intervention in order to improve the following deficits and impairments:  Abnormal gait, Decreased activity tolerance, Decreased balance, Decreased cognition, Decreased endurance, Decreased knowledge of use of DME, Decreased mobility, Decreased range of motion, Decreased strength, Impaired flexibility, Postural dysfunction, Prosthetic Dependency  Visit Diagnosis: Unsteadiness on feet  Other abnormalities of gait and mobility  Muscle weakness (generalized)  Stiffness of left knee, not elsewhere  classified  Abnormal posture     Problem List Patient Active Problem List   Diagnosis Date Noted   Prostate cancer ([place]) 06/28/2021   Abscess of bursa of left ankle 11/22/2020   Subacute osteomyelitis, left ankle and foot ([place])    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   Osteomyelitis of ankle or foot, left, acute ([place]) 08/23/2014   Cellulitis 10/12/2012   Chest pain 10/12/2012   Tobacco abuse 10/12/2012   ABSCESS, AXILLA, LEFT 02/23/2008   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   CHEST WALL PAIN, ACUTE 12/03/2007   CANDIDIASIS, GLANS PENIS 09/16/2007   HEADACHE 08/04/2007   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 [place], [place], [address] Phone: [phone]   Fax:  [phone]  Name: [name] MRN: [id number] Date of Birth: [birth year]

## 2021-07-17 ENCOUNTER — Encounter: Payer: Medicare Other | Admitting: Physical Therapy

## 2021-07-18 ENCOUNTER — Ambulatory Visit: Payer: Self-pay | Admitting: *Deleted

## 2021-07-18 ENCOUNTER — Other Ambulatory Visit: Payer: Self-pay | Admitting: *Deleted

## 2021-07-18 ENCOUNTER — Telehealth: Payer: Self-pay | Admitting: *Deleted

## 2021-07-18 NOTE — Patient Outreach (Signed)
°  Care Management   Follow Up Note   07/18/2021 Name: [name] MRN: [id number] DOB: 1940/08/10  Referred by: [name], MD  Reason for Referral:  Chronic Care Management Needs in Patient with Hyperlipidemia, Acute Renal Failure, Gout, Type II Diabetes Mellitus, Occipital Stroke, and Hypertension.  An unsuccessful telephone outreach was attempted today. The patient was referred to the case management team for assistance with care management and care coordination. HIPAA compliant messages were left on voicemail for patient, and his niece, [name], providing contact information, encouraging them to return LCSW's call at their earliest convenience.  LCSW will make a second initial telephone outreach call attempt within the next 5-7 business days, if a return call is not received in the meantime.  Follow Up Plan: Telephone follow up appointment with care management team member scheduled for:  07/24/2021 at 9:30 am.  Nat Christen, BSW, MSW, [place]  Licensed Clinical Social Worker  [place]  [facility] [place]. [address] [place], [place] [address] Physical Address-[address], [place], [place] [address] Toll Free Main # [phone] Fax # [phone] Cell # [phone] [email or web address]         SIGNATURE

## 2021-07-19 ENCOUNTER — Encounter: Payer: Medicare Other | Admitting: Physical Therapy

## 2021-07-19 NOTE — Telephone Encounter (Signed)
Order sent to [employer] will hold this message and monitor shipment status.

## 2021-07-19 NOTE — Telephone Encounter (Signed)
Does the pt want a written order for this or do you want me to order through [employer]? On-line it is just listed as an adult wheeled walker with walker leg extensions. Let me know and I will take care of it

## 2021-07-20 NOTE — Telephone Encounter (Signed)
Checked [employer] portal and that DME order has been signed by Dr. Georges Mouse and is now awaiting review

## 2021-07-23 ENCOUNTER — Encounter: Payer: Self-pay | Admitting: Physical Therapy

## 2021-07-23 ENCOUNTER — Other Ambulatory Visit: Payer: Self-pay

## 2021-07-23 ENCOUNTER — Ambulatory Visit (INDEPENDENT_AMBULATORY_CARE_PROVIDER_SITE_OTHER): Payer: Medicare Other | Admitting: Physical Therapy

## 2021-07-23 DIAGNOSIS — R2689 Other abnormalities of gait and mobility: Secondary | ICD-10-CM | POA: Diagnosis not present

## 2021-07-23 DIAGNOSIS — M25662 Stiffness of left knee, not elsewhere classified: Secondary | ICD-10-CM | POA: Diagnosis not present

## 2021-07-23 DIAGNOSIS — R293 Abnormal posture: Secondary | ICD-10-CM | POA: Diagnosis not present

## 2021-07-23 DIAGNOSIS — R2681 Unsteadiness on feet: Secondary | ICD-10-CM

## 2021-07-23 DIAGNOSIS — M6281 Muscle weakness (generalized): Secondary | ICD-10-CM | POA: Diagnosis not present

## 2021-07-23 NOTE — Therapy (Signed)
[facility] [address] [place], [place], [address] Phone: [phone]   Fax:  [phone]  Physical Therapy Treatment  Patient Details  Name: [name] MRN: [id number] Date of Birth: 15-Mar-1941 Referring Provider (PT): Meridee Score, MD   Encounter Date: 07/23/2021   PT End of Session - 07/23/21 0804     Visit Number 17    Number of Visits 25    Date for PT Re-Evaluation 08/02/21    Authorization Type [employer] Medicare    Progress Note Due on Visit 20    PT Start Time 0802    PT Stop Time 6314    PT Time Calculation (min) 42 min    Equipment Utilized During Treatment Gait belt    Activity Tolerance Patient tolerated treatment well;Patient limited by fatigue    Behavior During Therapy [facility] for tasks assessed/performed             Past Medical History:  Diagnosis Date   Arthritis    Denies   Glaucoma    High cholesterol    Hypertension    denies   Osteomyelitis ([place])    Right great toe   Peripheral vascular disease ([place])    diabetic  with osteomylitis   Pre-diabetes    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    Past Surgical History:  Procedure Laterality Date   AMPUTATION  07/23/2011   Procedure: AMPUTATION DIGIT;  Surgeon: Newt Minion, MD;  Location: [place];  Service: Orthopedics;  Laterality: Right;  Right Great Toe Amputation   AMPUTATION Right 09/18/2012   Procedure: Right Foot 2nd Ray Amputation;  Surgeon: Newt Minion, MD;  Location: [place];  Service: Orthopedics;  Laterality: Right;  Right Foot 2nd Ray Amputation   AMPUTATION Right 10/14/2012   Procedure: AMPUTATION FOOT;  Surgeon: Newt Minion, MD;  Location: [place];  Service: Orthopedics;  Laterality: Right;  Right Foot Transmetatarsal Amputation   AMPUTATION Left 09/23/2014   Procedure: Left Foot Transmetatarsal Amputation;  Surgeon: Newt Minion, MD;  Location: [place];  Service: Orthopedics;  Laterality: Left;   AMPUTATION Left 11/22/2020   Procedure: LEFT BELOW KNEE AMPUTATION;   Surgeon: Newt Minion, MD;  Location: [place];  Service: Orthopedics;  Laterality: Left;   BACK SURGERY     lower   CARPAL TUNNEL RELEASE Right 01/30/2018   Procedure: RIGHT CARPAL TUNNEL RELEASE;  Surgeon: Newt Minion, MD;  Location: [place];  Service: Orthopedics;  Laterality: Right;   CIRCUMCISION  2010   COLONOSCOPY     FOOT FRACTURE SURGERY Left 1970's   "broke it playing football" (10/12/2012)   HUMERUS FRACTURE SURGERY W/ IMPLANT Right 1960's   "put a plate in it" (9/70/2637)   I & D EXTREMITY Right 03/24/2020   Procedure: EXCISION RIGHT MEDIAL CUNEFORM;  Surgeon: Newt Minion, MD;  Location: [place];  Service: Orthopedics;  Laterality: Right;   INCISION AND DRAINAGE OF WOUND Left 2006   "foot" (10/12/2012)   [place] SURGERY  2008    There were no vitals filed for this visit.   Subjective Assessment - 07/23/21 0802     Subjective He reports wearing prosthesis some time but not daily.  Niece reports not noting any wear.    Patient is accompained by: Family member   niece   Pertinent History arthritis, glaucoma, HTN, osteomyelitis, PVD, DM2, CVA cerebellum, right toe amputations, NSTEMI, gout    Patient Stated Goals use prosthesis to go into community including church & fishing.  Currently in Pain? No/denies                               [facility] Adult PT Treatment/Exercise - 07/23/21 0802       Transfers   Transfers Sit to Stand;Stand to Sit;Stand Pivot Transfers    Sit to Stand 4: Min assist;With upper extremity assist;From chair/3-in-1;Other (comment)   chairs without armrests   Sit to Stand Details Verbal cues for technique;Tactile cues for weight shifting;Manual facilitation for weight shifting;Verbal cues for safe use of DME/AE;Visual cues for safe use of DME/AE    Stand to Sit 4: Min guard;With upper extremity assist;To chair/3-in-1;Other (comment)   chairs without armrests.   Stand to Sit Details (indicate cue type and reason) Verbal cues for  technique;Manual facilitation for weight shifting;Verbal cues for safe use of DME/AE;Visual cues for safe use of DME/AE      Ambulation/Gait   Ambulation/Gait Yes    Ambulation/Gait Assistance 4: Min guard;5: Supervision    Ambulation Distance (Feet) 300 Feet   100' X 2   Assistive device Rolling walker;Prosthesis    Gait Comments PT reviewed supervision technique during gait & following with w/c for longer distances.  [name] niece verbalized understanding.  PT discussed goal is walking short distances in his apt throughout day so walking more than using w/c to move in apt,  long distance of his max tolerance 1x/day or as often as family can help  and medium walk (distance between short & long) ~4x during day.  This will enable community access with RW with family over having to load/unload w/c which increases burden of care for family.  Niece verbalizes understanding.      Neuro Re-ed    Neuro Re-ed Details  balance with feet together and no UE support 1 min X 2 using intermit UE support      Knee/Hip Exercises: Machines for Strengthening   Total Gym Leg Press shuttle BLEs position 3 with 75# 15reps 3 sets, cues for full ROM, then Rt leg only 37#  X15  and Lt leg only 12# X15      Prosthetics   Prosthetic Care Comments  PT reviewed need for wear to enable mobility safely & reduce fall risk.  [name] niece verbalized understanding but pt made no comment.                       PT Short Term Goals - 07/12/21 0854       PT SHORT TERM GOAL #1   Title Patient donnes prosthesis without cues or physical assist.    Baseline still requires assistance due to poor vision and demenita, niece reports he does not wear it on non therapy days    Time 4    Period Weeks    Status On-going    Target Date 07/05/21      PT SHORT TERM GOAL #2   Title Patient tolerates prosthesis >8 hrs total /day without skin issues or limb pain    Baseline wears it about 6 hours on therapy days and not much on  non therapy days per niece report    Time 4    Period Weeks    Status On-going    Target Date 07/05/21      PT SHORT TERM GOAL #3   Title Patient able to transfer sit/stand std chair height without armrests using UEs to push to RW safely.  Baseline met 1/26 with min A with this    Time 4    Period Weeks    Status Achieved    Target Date 07/05/21      PT SHORT TERM GOAL #4   Title Patient ambulates 200' with RW & prosthesis with supervision.    Baseline up to 300' with RW and prosthesis with supervision mostly but does need verbal cues for obstable navigation due to poor visiion    Time 4    Period Weeks    Status Achieved    Target Date 07/05/21      PT SHORT TERM GOAL #5   Title .Patient negotiates ramps & curbs with RW & prosthesis with minA from niece with PT giving cues    Baseline mod A at times for balance to clear Lt leg stepping up onto curb    Time 4    Period Weeks    Status On-going    Target Date 07/05/21               PT Long Term Goals - 06/04/21 0842       PT LONG TERM GOAL #1   Title Patient & family are independent with prosthetic care and pt able to donne prosthesis modified independent.    Time 12    Period Weeks    Status On-going    Target Date 08/02/21      PT LONG TERM GOAL #2   Title Patient tolerates prosthesis wear >90% of awake hours without skin or limb pain issues.    Time 12    Period Weeks    Status On-going    Target Date 08/02/21      PT LONG TERM GOAL #3   Title [place] Balance >/= 36/56 with RW support to indicate lower fall risk    Time 12    Period Weeks    Status On-going    Target Date 08/02/21      PT LONG TERM GOAL #4   Title Patient ambulates 300' with RW & prosthesis modified independent.    Time 12    Period Weeks    Status On-going    Target Date 08/02/21      PT LONG TERM GOAL #5   Title Patient negotiates ramps, curbs with RW & stairs with single rail & prosthesis modified independent.    Time 12     Period Weeks    Status On-going    Target Date 08/02/21                   Plan - 07/23/21 0093     Clinical Impression Statement PT focused on need for prosthesis wear & activity outside of PT to improve safety, overall conditioning and mobility with family for outings over w/c.  He    Personal Factors and Comorbidities Comorbidity 3+;Past/Current Experience;Other   lives alone   Comorbidities arthritis, glaucoma, HTN, osteomyelitis, PVD, DM2, CVA cerebellum, right transmetatarsal amputation, NSTEMI, gout    Examination-Activity Limitations Locomotion Level;Reach Overhead;Stairs;Stand;Toileting;Transfers    Examination-Participation Restrictions Community Activity    Stability/Clinical Decision Making Evolving/Moderate complexity    Rehab Potential Good    PT Frequency 2x / week    PT Duration 12 weeks    PT Treatment/Interventions ADLs/Self Care Home Management;DME Instruction;Gait training;Stair training;Functional mobility training;Therapeutic activities;Therapeutic exercise;Balance training;Neuromuscular re-education;Patient/family education;Prosthetic Training;Manual techniques;Passive range of motion;Vestibular    PT Next Visit Plan continue review prosthetic care, prosthetic gait with RW, standing balance activities, strength, and  endurance    Consulted and Agree with Plan of Care Patient;Family member/caregiver    Family Member Consulted neice, Arlana Lindau             Patient will benefit from skilled therapeutic intervention in order to improve the following deficits and impairments:  Abnormal gait, Decreased activity tolerance, Decreased balance, Decreased cognition, Decreased endurance, Decreased knowledge of use of DME, Decreased mobility, Decreased range of motion, Decreased strength, Impaired flexibility, Postural dysfunction, Prosthetic Dependency  Visit Diagnosis: Unsteadiness on feet  Other abnormalities of gait and mobility  Muscle weakness  (generalized)  Stiffness of left knee, not elsewhere classified  Abnormal posture     Problem List Patient Active Problem List   Diagnosis Date Noted   Prostate cancer ([place]) 06/28/2021   Abscess of bursa of left ankle 11/22/2020   Subacute osteomyelitis, left ankle and foot ([place])    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 ([place]) 04/12/2020   Acute encephalopathy 03/20/2020   SIRS (systemic inflammatory response syndrome) ([place]) 03/20/2020   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[place]) 03/20/2020   ARF (acute renal failure) ([place]) 03/20/2020   Midfoot ulcer, left, limited to breakdown of skin ([place]) 12/29/2018   Occipital stroke ([place]) 10/31/2018   DM2 (diabetes mellitus, type 2) ([place]) 10/31/2018   NSTEMI (non-ST elevated myocardial infarction) ([place]) 05/28/2018   Syncope 05/28/2018   HTN (hypertension) 05/28/2018   Hyperlipemia, mixed 05/28/2018   Rash and nonspecific skin eruption 12/16/2017   Pain in right hand 09/11/2017   Carpal tunnel syndrome, right upper limb 07/01/2017   Arthritis of carpometacarpal [facility]) joint of left thumb 12/30/2016   Midfoot skin ulcer, right, limited to breakdown of skin ([place]) 07/22/2016   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   Osteomyelitis of ankle or foot, left, acute ([place]) 08/23/2014   Cellulitis 10/12/2012   Chest pain 10/12/2012   Tobacco abuse 10/12/2012   ABSCESS, AXILLA, LEFT 02/23/2008   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   CHEST WALL PAIN, ACUTE 12/03/2007   CANDIDIASIS, GLANS PENIS 09/16/2007   HEADACHE 08/04/2007   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 [place], [place], [address] Phone: [phone]   Fax:  [phone]  Name: [name] MRN: [id number] Date of Birth: 02-Sep-1940

## 2021-07-24 ENCOUNTER — Telehealth: Payer: Self-pay | Admitting: *Deleted

## 2021-07-24 ENCOUNTER — Ambulatory Visit: Payer: Self-pay | Admitting: *Deleted

## 2021-07-24 ENCOUNTER — Encounter: Payer: Self-pay | Admitting: *Deleted

## 2021-07-24 NOTE — Patient Outreach (Signed)
°  Care Management   Follow Up Note   07/24/2021 Name: Nicolae Vasek MRN: [id number] DOB: [birth year]  Referred by: [name], MD  Reason for Referral:  Chronic Care Management Needs in Patient with Hyperlipidemia, Acute Renal Failure, Gout, Type II Diabetes Mellitus, Occipital Stroke, and Hypertension.  A second unsuccessful telephone outreach was attempted today. The patient was referred to the case management team for assistance with care management and care coordination. HIPAA compliant messages were left on voicemail for patient, and his niece, [name], providing contact information, encouraging them to return LCSW's call at their earliest convenience.  LCSW will make a third initial telephone outreach call attempt within the next 5-7 business days, if a return call is not received in the meantime.  LCSW will also mail an Unsuccessful Outreach Letter to patient's home.   Follow-Up Plan: Telephone follow-up appointment with care management team member scheduled for 07/31/2021 at 9:00 am.   Nat Christen, BSW, MSW, [place]  Licensed Clinical Social Worker  [place]  Mailing [place]. [address] [place], [place] [address] Physical Address-[address] [place], [place] [address] Toll Free Main # [phone] Fax # [phone] Cell # [phone] [email or web address]

## 2021-07-24 NOTE — Telephone Encounter (Addendum)
Britany Callicott His niece tried to call [employer] this morning and they said they do not have an order.  PT plans to discharge next week and they will need this walker to keep him mobile so he does not get weaker sitting around.  Can you please check on it and let me know what we need to do? Shirlean Mylar    Received a message from [facility] that they are needing another ICD 10 code for insurance not just the Z89.512 for BKA. Gave M62.81 for generalized  weakness and R26.81 unsteady gait will continue to monitor for approval.

## 2021-07-25 ENCOUNTER — Other Ambulatory Visit: Payer: Self-pay

## 2021-07-25 ENCOUNTER — Encounter: Payer: Self-pay | Admitting: Physical Therapy

## 2021-07-25 ENCOUNTER — Ambulatory Visit (INDEPENDENT_AMBULATORY_CARE_PROVIDER_SITE_OTHER): Payer: Medicare Other | Admitting: Physical Therapy

## 2021-07-25 DIAGNOSIS — R2681 Unsteadiness on feet: Secondary | ICD-10-CM

## 2021-07-25 DIAGNOSIS — Z89512 Acquired absence of left leg below knee: Secondary | ICD-10-CM | POA: Diagnosis not present

## 2021-07-25 DIAGNOSIS — M6281 Muscle weakness (generalized): Secondary | ICD-10-CM | POA: Diagnosis not present

## 2021-07-25 DIAGNOSIS — R293 Abnormal posture: Secondary | ICD-10-CM

## 2021-07-25 DIAGNOSIS — R2689 Other abnormalities of gait and mobility: Secondary | ICD-10-CM | POA: Diagnosis not present

## 2021-07-25 DIAGNOSIS — M25662 Stiffness of left knee, not elsewhere classified: Secondary | ICD-10-CM | POA: Diagnosis not present

## 2021-07-25 NOTE — Telephone Encounter (Signed)
No problem! Sorry it is taking so long

## 2021-07-25 NOTE — Telephone Encounter (Signed)
Its so frustrating. I submitted the order on 07/19/21. I check daily to monitor the processing of the order. On 07/23/2021 they asked for additional ICD 10 codes not just the BKA code which has always been sufficient. I submitted the codes yesterday and received notice today that they wanted it update in the original order which I did just now. Hopefull there will not be any further delay in the processing and he will get is shipped to him this week.

## 2021-07-25 NOTE — Therapy (Signed)
[facility] [address] [place], [place], [address] Phone: [phone]   Fax:  [phone]  Physical Therapy Treatment  Patient Details  Name: [name] MRN: [id number] Date of Birth: 1940-12-14 Referring Provider (PT): Meridee Score, MD   Encounter Date: 07/25/2021   PT End of Session - 07/25/21 0806     Visit Number 18    Number of Visits 25    Date for PT Re-Evaluation 08/02/21    Authorization Type [employer] Medicare    Progress Note Due on Visit 20    PT Start Time 0801    PT Stop Time 0839    PT Time Calculation (min) 38 min    Equipment Utilized During Treatment Gait belt    Activity Tolerance Patient tolerated treatment well;Patient limited by fatigue    Behavior During Therapy [facility] for tasks assessed/performed             Past Medical History:  Diagnosis Date   Arthritis    Denies   Glaucoma    High cholesterol    Hypertension    denies   Osteomyelitis ([place])    Right great toe   Peripheral vascular disease ([place])    diabetic  with osteomylitis   Pre-diabetes    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    Past Surgical History:  Procedure Laterality Date   AMPUTATION  07/23/2011   Procedure: AMPUTATION DIGIT;  Surgeon: Newt Minion, MD;  Location: [place];  Service: Orthopedics;  Laterality: Right;  Right Great Toe Amputation   AMPUTATION Right 09/18/2012   Procedure: Right Foot 2nd Ray Amputation;  Surgeon: Newt Minion, MD;  Location: [place];  Service: Orthopedics;  Laterality: Right;  Right Foot 2nd Ray Amputation   AMPUTATION Right 10/14/2012   Procedure: AMPUTATION FOOT;  Surgeon: Newt Minion, MD;  Location: [place];  Service: Orthopedics;  Laterality: Right;  Right Foot Transmetatarsal Amputation   AMPUTATION Left 09/23/2014   Procedure: Left Foot Transmetatarsal Amputation;  Surgeon: Newt Minion, MD;  Location: [place];  Service: Orthopedics;  Laterality: Left;   AMPUTATION Left 11/22/2020   Procedure: LEFT BELOW KNEE AMPUTATION;   Surgeon: Newt Minion, MD;  Location: [place];  Service: Orthopedics;  Laterality: Left;   BACK SURGERY     lower   CARPAL TUNNEL RELEASE Right 01/30/2018   Procedure: RIGHT CARPAL TUNNEL RELEASE;  Surgeon: Newt Minion, MD;  Location: [place];  Service: Orthopedics;  Laterality: Right;   CIRCUMCISION  2010   COLONOSCOPY     FOOT FRACTURE SURGERY Left 1970's   "broke it playing football" (10/12/2012)   HUMERUS FRACTURE SURGERY W/ IMPLANT Right 1960's   "put a plate in it" (3/97/6734)   I & D EXTREMITY Right 03/24/2020   Procedure: EXCISION RIGHT MEDIAL CUNEFORM;  Surgeon: Newt Minion, MD;  Location: [place];  Service: Orthopedics;  Laterality: Right;   INCISION AND DRAINAGE OF WOUND Left 2006   "foot" (10/12/2012)   [place] SURGERY  2008    There were no vitals filed for this visit.   Subjective Assessment - 07/25/21 0801     Subjective He arrived without prosthesis this morning due to time issue.    Patient is accompained by: Family member   niece   Pertinent History arthritis, glaucoma, HTN, osteomyelitis, PVD, DM2, CVA cerebellum, right toe amputations, NSTEMI, gout    Patient Stated Goals use prosthesis to go into community including church & fishing.    Currently in Pain?  No/denies                               [facility] Adult PT Treatment/Exercise - 07/25/21 0801       Transfers   Transfers Sit to Stand;Stand to Sit;Stand Pivot Transfers    Sit to Stand 5: Supervision;With upper extremity assist;From chair/3-in-1;Other (comment)   chairs without armrests   Stand to Sit To chair/3-in-1;Other (comment);5: Supervision;With upper extremity assist   chairs without armrests.     Ambulation/Gait   Ambulation/Gait Yes    Ambulation/Gait Assistance 5: Supervision    Ambulation Distance (Feet) 200 Feet   200' & 100'   Assistive device Rolling walker;Prosthesis    Ramp 4: Min assist   RW & TTA prosthesis   Ramp Details (indicate cue type and reason) general  verbal cues mainly for visual deficits    Curb 4: Min assist   RW & TTA prosthesis   Curb Details (indicate cue type and reason) general verbal cues mainly for visual deficits      Knee/Hip Exercises: Aerobic   Nustep seat 14 L7 UE/LE X7 min      Knee/Hip Exercises: Machines for Strengthening   Total Gym Leg Press shuttle BLEs position 3 with 75# 15reps 3 sets, cues for full ROM, then Rt leg only 37#  X15  and Lt leg only 12# X15      Prosthetics   Prosthetic Care Comments  PT recommended using lotion at night to decrease dry skin.    Residual limb condition  dry ashy skin. no open areas.    Education Provided Proper wear schedule/adjustment;Residual limb care    Person(s) Educated Patient;Caregiver(s)    Education Method Explanation;Verbal cues    Education Method Verbalized understanding;Needs further [place] Prosthesis Supervision                       PT Short Term Goals - 07/12/21 0854       PT SHORT TERM GOAL #1   Title Patient donnes prosthesis without cues or physical assist.    Baseline still requires assistance due to poor vision and demenita, niece reports he does not wear it on non therapy days    Time 4    Period Weeks    Status On-going    Target Date 07/05/21      PT SHORT TERM GOAL #2   Title Patient tolerates prosthesis >8 hrs total /day without skin issues or limb pain    Baseline wears it about 6 hours on therapy days and not much on non therapy days per niece report    Time 4    Period Weeks    Status On-going    Target Date 07/05/21      PT SHORT TERM GOAL #3   Title Patient able to transfer sit/stand std chair height without armrests using UEs to push to RW safely.    Baseline met 1/26 with min A with this    Time 4    Period Weeks    Status Achieved    Target Date 07/05/21      PT SHORT TERM GOAL #4   Title Patient ambulates 200' with RW & prosthesis with supervision.    Baseline up to 300' with RW and prosthesis  with supervision mostly but does need verbal cues for obstable navigation due to poor visiion    Time 4  Period Weeks    Status Achieved    Target Date 07/05/21      PT SHORT TERM GOAL #5   Title .Patient negotiates ramps & curbs with RW & prosthesis with minA from niece with PT giving cues    Baseline mod A at times for balance to clear Lt leg stepping up onto curb    Time 4    Period Weeks    Status On-going    Target Date 07/05/21               PT Long Term Goals - 06/04/21 0842       PT LONG TERM GOAL #1   Title Patient & family are independent with prosthetic care and pt able to donne prosthesis modified independent.    Time 12    Period Weeks    Status On-going    Target Date 08/02/21      PT LONG TERM GOAL #2   Title Patient tolerates prosthesis wear >90% of awake hours without skin or limb pain issues.    Time 12    Period Weeks    Status On-going    Target Date 08/02/21      PT LONG TERM GOAL #3   Title [place] Balance >/= 36/56 with RW support to indicate lower fall risk    Time 12    Period Weeks    Status On-going    Target Date 08/02/21      PT LONG TERM GOAL #4   Title Patient ambulates 300' with RW & prosthesis modified independent.    Time 12    Period Weeks    Status On-going    Target Date 08/02/21      PT LONG TERM GOAL #5   Title Patient negotiates ramps, curbs with RW & stairs with single rail & prosthesis modified independent.    Time 12    Period Weeks    Status On-going    Target Date 08/02/21                   Plan - 07/25/21 0807     Clinical Impression Statement Patient was able to ambulate on level surfaces with supervision mainly for visual deficits today and no physical assist for balance.  He required less assistance on ramps & curbs also.  He needs a tall adult rolling walker for home to enable continued mobility. If he sits with limited to no mobility when PT finishes, then he will probably become deconditioned &  unable to walk with family.    Personal Factors and Comorbidities Comorbidity 3+;Past/Current Experience;Other   lives alone   Comorbidities arthritis, glaucoma, HTN, osteomyelitis, PVD, DM2, CVA cerebellum, right transmetatarsal amputation, NSTEMI, gout    Examination-Activity Limitations Locomotion Level;Reach Overhead;Stairs;Stand;Toileting;Transfers    Examination-Participation Restrictions Community Activity    Stability/Clinical Decision Making Evolving/Moderate complexity    Rehab Potential Good    PT Frequency 2x / week    PT Duration 12 weeks    PT Treatment/Interventions ADLs/Self Care Home Management;DME Instruction;Gait training;Stair training;Functional mobility training;Therapeutic activities;Therapeutic exercise;Balance training;Neuromuscular re-education;Patient/family education;Prosthetic Training;Manual techniques;Passive range of motion;Vestibular    PT Next Visit Plan check on RW order, check LTGs,  continue review prosthetic care, prosthetic gait with RW, standing balance activities, strength, and endurance    Consulted and Agree with Plan of Care Patient;Family member/caregiver    Family Member Consulted neice, Arlana [place]au             Patient will benefit  from skilled therapeutic intervention in order to improve the following deficits and impairments:  Abnormal gait, Decreased activity tolerance, Decreased balance, Decreased cognition, Decreased endurance, Decreased knowledge of use of DME, Decreased mobility, Decreased range of motion, Decreased strength, Impaired flexibility, Postural dysfunction, Prosthetic Dependency  Visit Diagnosis: Unsteadiness on feet  Other abnormalities of gait and mobility  Muscle weakness (generalized)  Stiffness of left knee, not elsewhere classified  Abnormal posture     Problem List Patient Active Problem List   Diagnosis Date Noted   Prostate cancer ([place]) 06/28/2021   Abscess of bursa of left ankle 11/22/2020   Subacute  osteomyelitis, left ankle and foot ([place])    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 ([place]) 04/12/2020   Acute encephalopathy 03/20/2020   SIRS (systemic inflammatory response syndrome) ([place]) 03/20/2020   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[place]) 03/20/2020   ARF (acute renal failure) ([place]) 03/20/2020   Midfoot ulcer, left, limited to breakdown of skin ([place]) 12/29/2018   Occipital stroke ([place]) 10/31/2018   DM2 (diabetes mellitus, type 2) ([place]) 10/31/2018   NSTEMI (non-ST elevated myocardial infarction) ([place]) 05/28/2018   Syncope 05/28/2018   HTN (hypertension) 05/28/2018   Hyperlipemia, mixed 05/28/2018   Rash and nonspecific skin eruption 12/16/2017   Pain in right hand 09/11/2017   Carpal tunnel syndrome, right upper limb 07/01/2017   Arthritis of carpometacarpal [facility]) joint of left thumb 12/30/2016   Midfoot skin ulcer, right, limited to breakdown of skin ([place]) 07/22/2016   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   Osteomyelitis of ankle or foot, left, acute ([place]) 08/23/2014   Cellulitis 10/12/2012   Chest pain 10/12/2012   Tobacco abuse 10/12/2012   ABSCESS, AXILLA, LEFT 02/23/2008   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   CHEST WALL PAIN, ACUTE 12/03/2007   CANDIDIASIS, GLANS PENIS 09/16/2007   HEADACHE 08/04/2007   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 [place], [place], [address] Phone: [phone]   Fax:  [phone]  Name: [name] MRN: [id number] Date of Birth:  [birth year]

## 2021-07-26 NOTE — Telephone Encounter (Signed)
As of today notes in parachute advise that the order had been sent to a third party supplier for processing and has delivery date as today.

## 2021-07-30 ENCOUNTER — Other Ambulatory Visit: Payer: Self-pay

## 2021-07-30 ENCOUNTER — Encounter: Payer: Self-pay | Admitting: Physical Therapy

## 2021-07-30 ENCOUNTER — Ambulatory Visit (INDEPENDENT_AMBULATORY_CARE_PROVIDER_SITE_OTHER): Payer: Medicare Other | Admitting: Physical Therapy

## 2021-07-30 DIAGNOSIS — M6281 Muscle weakness (generalized): Secondary | ICD-10-CM

## 2021-07-30 DIAGNOSIS — R293 Abnormal posture: Secondary | ICD-10-CM | POA: Diagnosis not present

## 2021-07-30 DIAGNOSIS — R2689 Other abnormalities of gait and mobility: Secondary | ICD-10-CM

## 2021-07-30 DIAGNOSIS — M25662 Stiffness of left knee, not elsewhere classified: Secondary | ICD-10-CM | POA: Diagnosis not present

## 2021-07-30 DIAGNOSIS — R2681 Unsteadiness on feet: Secondary | ICD-10-CM | POA: Diagnosis not present

## 2021-07-30 NOTE — Therapy (Signed)
[facility] Physical Therapy [address] [place], [place], [address] Phone: [phone]   Fax:  [phone]  Physical Therapy Treatment  Patient Details  Name: [name] MRN: [id number] Date of Birth: 10-Feb-1941 Referring Provider (PT): Meridee Score, MD   Encounter Date: 07/30/2021   PT End of Session - 07/30/21 0804     Visit Number 19    Number of Visits 25    Date for PT Re-Evaluation 08/02/21    Authorization Type [employer] Medicare    Progress Note Due on Visit 20    PT Start Time 0800    PT Stop Time 0843    PT Time Calculation (min) 43 min    Equipment Utilized During Treatment Gait belt    Activity Tolerance Patient tolerated treatment well;Patient limited by fatigue    Behavior During Therapy [facility] for tasks assessed/performed             Past Medical History:  Diagnosis Date   Arthritis    Denies   Glaucoma    High cholesterol    Hypertension    denies   Osteomyelitis ([place])    Right great toe   Peripheral vascular disease ([place])    diabetic  with osteomylitis   Pre-diabetes    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    Past Surgical History:  Procedure Laterality Date   AMPUTATION  07/23/2011   Procedure: AMPUTATION DIGIT;  Surgeon: Newt Minion, MD;  Location: [place];  Service: Orthopedics;  Laterality: Right;  Right Great Toe Amputation   AMPUTATION Right 09/18/2012   Procedure: Right Foot 2nd Ray Amputation;  Surgeon: Newt Minion, MD;  Location: [place];  Service: Orthopedics;  Laterality: Right;  Right Foot 2nd Ray Amputation   AMPUTATION Right 10/14/2012   Procedure: AMPUTATION FOOT;  Surgeon: Newt Minion, MD;  Location: [place];  Service: Orthopedics;  Laterality: Right;  Right Foot Transmetatarsal Amputation   AMPUTATION Left 09/23/2014   Procedure: Left Foot Transmetatarsal Amputation;  Surgeon: Newt Minion, MD;  Location: [place];  Service: Orthopedics;  Laterality: Left;   AMPUTATION Left 11/22/2020   Procedure: LEFT BELOW KNEE AMPUTATION;   Surgeon: Newt Minion, MD;  Location: [place];  Service: Orthopedics;  Laterality: Left;   BACK SURGERY     lower   CARPAL TUNNEL RELEASE Right 01/30/2018   Procedure: RIGHT CARPAL TUNNEL RELEASE;  Surgeon: Newt Minion, MD;  Location: [place];  Service: Orthopedics;  Laterality: Right;   CIRCUMCISION  2010   COLONOSCOPY     FOOT FRACTURE SURGERY Left 1970's   "broke it playing football" (10/12/2012)   HUMERUS FRACTURE SURGERY W/ IMPLANT Right 1960's   "put a plate in it" (4/97/5300)   I & D EXTREMITY Right 03/24/2020   Procedure: EXCISION RIGHT MEDIAL CUNEFORM;  Surgeon: Newt Minion, MD;  Location: [place];  Service: Orthopedics;  Laterality: Right;   INCISION AND DRAINAGE OF WOUND Left 2006   "foot" (10/12/2012)   [place] SURGERY  2008    There were no vitals filed for this visit.   Subjective Assessment - 07/30/21 0800     Subjective He reports wearing prosthesis some but niece does not think so.    Patient is accompained by: Family member   niece   Pertinent History arthritis, glaucoma, HTN, osteomyelitis, PVD, DM2, CVA cerebellum, right toe amputations, NSTEMI, gout    Patient Stated Goals use prosthesis to go into community including church & fishing.    Currently in  Pain? No/denies                [facility] PT Assessment - 07/30/21 0800       Assessment   Medical Diagnosis Z89.512 (ICD-10-CM) - S/P BKA (below knee amputation) unilateral, left    Referring Provider (PT) Meridee Score, MD    Onset Date/Surgical Date 03/23/21      Merrilee Jansky Balance Test   Sit to Stand Needs minimal aid to stand or to stabilize   task of Berg with RW = 3   Standing Unsupported Needs several tries to stand 30 seconds unsupported   task of Berg with RW = 4   Sitting with Back Unsupported but Feet Supported on Floor or Stool Able to sit safely and securely 2 minutes    Stand to Sit Uses backs of legs against chair to control descent   task of Berg with RW = 3   Transfers Able to transfer safely,  definite need of hands    Standing Unsupported with Eyes Closed Needs help to keep from falling   task of Berg with RW = 3   Standing Unsupported with Feet Together Needs help to attain position and unable to hold for 15 seconds   task of Berg with RW = 3   From Standing, Reach Forward with Outstretched Arm Loses balance while trying/requires external support   task of Berg with RW = 3   From Standing Position, Pick up Object from Floor Unable to try/needs assist to keep balance   task of Berg with RW = 3   From Standing Position, Turn to Look Behind Over each Shoulder Needs assist to keep from losing balance and falling   task of Berg with RW = 3   Turn 360 Degrees Needs assistance while turning   task of Berg with RW = 2   Standing Unsupported, Alternately Place Feet on Step/Stool Needs assistance to keep from falling or unable to try   task of Berg with RW = 1   Standing Unsupported, One Foot in [employer] balance while stepping or standing   task of Berg with RW = 2   Standing on One Leg Unable to try or needs assist to prevent fall   task of Berg with RW = 1   Total Score 11    Berg comment: Berg Task with RW support initialy was = 16/56 today, 07/30/21 was 37/56                           [facility] Adult PT Treatment/Exercise - 07/30/21 0800       Transfers   Transfers Sit to Stand;Stand to [employer] Transfers    Sit to Stand 5: Supervision;With upper extremity assist;From chair/3-in-1;Other (comment)   chairs without armrests   Stand to Sit To chair/3-in-1;Other (comment);5: Supervision;With upper extremity assist   chairs without armrests.     Ambulation/Gait   Ambulation/Gait Yes    Ambulation/Gait Assistance 5: Supervision    Ambulation Distance (Feet) 275 Feet   200' & 100'   Assistive device Rolling walker;Prosthesis    Ramp 4: Min assist   RW & TTA prosthesis   Curb 4: Min assist   RW & TTA prosthesis     Knee/Hip Exercises: Aerobic   Nustep seat 14 L7  UE/LE X7 min      Knee/Hip Exercises: Machines for Strengthening   Total Gym Leg Press shuttle BLEs position 3 with 75# 15reps  3 sets, cues for full ROM, then Rt leg only 37#  X15  and Lt leg only 12# X15      Prosthetics   Prosthetic Care Comments  PT recommended using lotion at night to decrease dry skin.    Residual limb condition  dry ashy skin. no open areas.    Education Provided Proper wear schedule/adjustment;Residual limb care                       PT Short Term Goals - 07/12/21 0854       PT SHORT TERM GOAL #1   Title Patient donnes prosthesis without cues or physical assist.    Baseline still requires assistance due to poor vision and demenita, niece reports he does not wear it on non therapy days    Time 4    Period Weeks    Status On-going    Target Date 07/05/21      PT SHORT TERM GOAL #2   Title Patient tolerates prosthesis >8 hrs total /day without skin issues or limb pain    Baseline wears it about 6 hours on therapy days and not much on non therapy days per niece report    Time 4    Period Weeks    Status On-going    Target Date 07/05/21      PT SHORT TERM GOAL #3   Title Patient able to transfer sit/stand std chair height without armrests using UEs to push to RW safely.    Baseline met 1/26 with min A with this    Time 4    Period Weeks    Status Achieved    Target Date 07/05/21      PT SHORT TERM GOAL #4   Title Patient ambulates 200' with RW & prosthesis with supervision.    Baseline up to 300' with RW and prosthesis with supervision mostly but does need verbal cues for obstable navigation due to poor visiion    Time 4    Period Weeks    Status Achieved    Target Date 07/05/21      PT SHORT TERM GOAL #5   Title .Patient negotiates ramps & curbs with RW & prosthesis with minA from niece with PT giving cues    Baseline mod A at times for balance to clear Lt leg stepping up onto curb    Time 4    Period Weeks    Status On-going     Target Date 07/05/21               PT Long Term Goals - 07/30/21 0834       PT LONG TERM GOAL #1   Title Patient & family are independent with prosthetic care and pt able to donne prosthesis modified independent.    Time 12    Period Weeks    Status On-going    Target Date 08/02/21      PT LONG TERM GOAL #2   Title Patient tolerates prosthesis wear >90% of awake hours without skin or limb pain issues.    Time 12    Period Weeks    Status On-going    Target Date 08/02/21      PT LONG TERM GOAL #3   Title Berg Balance >/= 36/56 with RW support to indicate lower fall risk    Baseline 07/30/2021  Berg task with RW = 37/56    Time 12    Period Weeks  Status Achieved    Target Date 08/02/21      PT LONG TERM GOAL #4   Title Patient ambulates with RW & prosthesis 300' with supervision and household simulation 2' modified independent.    Time 12    Period Weeks    Status Revised    Target Date 08/02/21      PT LONG TERM GOAL #5   Title Patient negotiates ramps & curbs with RW & prosthesis with minA from niece for community mobility    Time 12    Period Weeks    Status Revised    Target Date 08/02/21                   Plan - 07/30/21 0805     Clinical Impression Statement Tasks of Berg with RW support improved to 37/56 from 16/56 which indicates improved balance for ADLs with RW support.  He appears to have improved his mobility with prosthesis & RW but does not seem to wear the prosthesis outside of PT. His niece / caregiver verbalizes understanding of benefits to functioning with prosthesis but patient is not receptive.  His function appears to be plateauing at this time.    Personal Factors and Comorbidities Comorbidity 3+;Past/Current Experience;Other   lives alone   Comorbidities arthritis, glaucoma, HTN, osteomyelitis, PVD, DM2, CVA cerebellum, right transmetatarsal amputation, NSTEMI, gout    Examination-Activity Limitations Locomotion Level;Reach  Overhead;Stairs;Stand;Toileting;Transfers    Examination-Participation Restrictions Community Activity    Stability/Clinical Decision Making Evolving/Moderate complexity    Rehab Potential Good    PT Frequency 2x / week    PT Duration 12 weeks    PT Treatment/Interventions ADLs/Self Care Home Management;DME Instruction;Gait training;Stair training;Functional mobility training;Therapeutic activities;Therapeutic exercise;Balance training;Neuromuscular re-education;Patient/family education;Prosthetic Training;Manual techniques;Passive range of motion;Vestibular    PT Next Visit Plan check remaining LTGs & discharge,  adjust his RW    Consulted and Agree with Plan of Care Patient;Family member/caregiver    Family Member Consulted neice, Arlana Lindau             Patient will benefit from skilled therapeutic intervention in order to improve the following deficits and impairments:  Abnormal gait, Decreased activity tolerance, Decreased balance, Decreased cognition, Decreased endurance, Decreased knowledge of use of DME, Decreased mobility, Decreased range of motion, Decreased strength, Impaired flexibility, Postural dysfunction, Prosthetic Dependency  Visit Diagnosis: Unsteadiness on feet  Muscle weakness (generalized)  Other abnormalities of gait and mobility  Stiffness of left knee, not elsewhere classified  Abnormal posture     Problem List Patient Active Problem List   Diagnosis Date Noted   Prostate cancer ([place]) 06/28/2021   Abscess of bursa of left ankle 11/22/2020   Subacute osteomyelitis, left ankle and foot ([place])    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 ([place]) 04/12/2020   Acute encephalopathy 03/20/2020   SIRS (systemic inflammatory response syndrome) ([place]) 03/20/2020   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[place]) 03/20/2020   ARF (acute renal failure) ([place]) 03/20/2020   Midfoot ulcer, left, limited to breakdown of skin ([place]) 12/29/2018   Occipital stroke ([place])  10/31/2018   DM2 (diabetes mellitus, type 2) ([place]) 10/31/2018   NSTEMI (non-ST elevated myocardial infarction) ([place]) 05/28/2018   Syncope 05/28/2018   HTN (hypertension) 05/28/2018   Hyperlipemia, mixed 05/28/2018   Rash and nonspecific skin eruption 12/16/2017   Pain in right hand 09/11/2017   Carpal tunnel syndrome, right upper limb 07/01/2017   Arthritis of carpometacarpal ([place]) joint of left thumb 12/30/2016  Midfoot skin ulcer, right, limited to breakdown of skin ([place]) 07/22/2016   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/23/2014   Osteomyelitis of ankle or foot, left, acute ([place]) 08/23/2014   Cellulitis 10/12/2012   Chest pain 10/12/2012   Tobacco abuse 10/12/2012   ABSCESS, AXILLA, LEFT 02/23/2008   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   CHEST WALL PAIN, ACUTE 12/03/2007   CANDIDIASIS, GLANS PENIS 09/16/2007   HEADACHE 08/04/2007   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 [place], [place], [address] Phone: [phone]   Fax:  [phone]  Name: [name] MRN: [id number] Date of Birth: 10-Feb-1941

## 2021-07-30 NOTE — Telephone Encounter (Signed)
Item was delivered at [place] by [employer] 07/27/2021

## 2021-07-31 ENCOUNTER — Other Ambulatory Visit: Payer: Self-pay | Admitting: *Deleted

## 2021-07-31 NOTE — Patient Outreach (Signed)
°  Care Management   Follow Up Note   07/31/2021 Name: Kenyatta Keidel MRN: [id number] DOB: [birth year]  Referred by: [name], MD  Reason for Referral:  Chronic Care Management Needs in Patient with Hyperlipidemia, Acute Renal Failure, Gout, Type II Diabetes Mellitus, Occipital Stroke, and Hypertension.  Third unsuccessful initial outreach telephone call was attempted today. The patient was referred to the case management team for assistance with care management and care coordination.  HIPAA compliant messages left on voicemail for patient, as well as niece, [name].  LCSW was able to make brief contact with patient's niece, Gwynneth Macleod, but she indicated that she has not worked with patient as his caregiver for more than 2 months, encouraging LCSW to contact her sister, [name].  LCSW explained to Mrs. Parker that LCSW has left 3 HIPAA compliant messages on voicemail for Mrs. Tisdale, but has yet to receive a return call.  Mrs. Jerline Pain agreed to have Mrs. Tisdale contact LCSW at her earliest convenience, taking down LCSW's contact information.  LCSW will make a fourth and final initial telephone outreach call attempt in 4 weeks, if a return call is not received from patient or Mrs. Tisdale in the meantime.  Follow-Up Plan:  08/28/2021 at 12:45 pm  Nat Christen, BSW, MSW, [place]  Licensed Clinical Social Worker  [place] [place]. [address] [place], [place] [address] Physical Address-[address] [place], [place] [address] Toll Free Main # [phone] Fax # [phone] Cell # [phone] [email or web address]

## 2021-08-01 ENCOUNTER — Other Ambulatory Visit: Payer: Self-pay

## 2021-08-01 ENCOUNTER — Ambulatory Visit (INDEPENDENT_AMBULATORY_CARE_PROVIDER_SITE_OTHER): Payer: Medicare Other | Admitting: Physical Therapy

## 2021-08-01 ENCOUNTER — Encounter: Payer: Self-pay | Admitting: Physical Therapy

## 2021-08-01 DIAGNOSIS — R293 Abnormal posture: Secondary | ICD-10-CM

## 2021-08-01 DIAGNOSIS — R2689 Other abnormalities of gait and mobility: Secondary | ICD-10-CM | POA: Diagnosis not present

## 2021-08-01 DIAGNOSIS — M25662 Stiffness of left knee, not elsewhere classified: Secondary | ICD-10-CM

## 2021-08-01 DIAGNOSIS — R2681 Unsteadiness on feet: Secondary | ICD-10-CM | POA: Diagnosis not present

## 2021-08-01 DIAGNOSIS — M6281 Muscle weakness (generalized): Secondary | ICD-10-CM | POA: Diagnosis not present

## 2021-08-01 NOTE — Therapy (Signed)
[facility] [address] [place], [place], [address] Phone: [phone]   Fax:  [phone]  Physical Therapy Treatment/Progress note/Discharge Progress Note reporting period date 06/14/21 to 08/01/21  See below for objective and subjective measurements relating to patients progress with PT.  PHYSICAL THERAPY DISCHARGE SUMMARY  Visits from Start of Care: 20  Current functional level related to goals / functional outcomes: See below   Remaining deficits: See below   Education / Equipment: HEP, RW adjustment Plan: Patient agrees to discharge.  Patient goals were not met to partially met. Patient is being discharged due to plateau in progress and he is at his maximal functional level at this time.       Patient Details  Name: [name] MRN: [id number] Date of Birth: Oct 04, 1940 Referring Provider (PT): Meridee Score, MD   Encounter Date: 08/01/2021   PT End of Session - 08/01/21 0849     Visit Number 20    Number of Visits 25    Date for PT Re-Evaluation 08/02/21    Authorization Type [employer] Medicare    Progress Note Due on Visit 20    PT Start Time 0800    PT Stop Time 0839    PT Time Calculation (min) 39 min    Equipment Utilized During Treatment Gait belt    Activity Tolerance Patient tolerated treatment well;Patient limited by fatigue    Behavior During Therapy WFL for tasks assessed/performed             Past Medical History:  Diagnosis Date   Arthritis    Denies   Glaucoma    High cholesterol    Hypertension    denies   Osteomyelitis ([place])    Right great toe   Peripheral vascular disease ([place])    diabetic  with osteomylitis   Pre-diabetes    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    Past Surgical History:  Procedure Laterality Date   AMPUTATION  07/23/2011   Procedure: AMPUTATION DIGIT;  Surgeon: Newt Minion, MD;  Location: [place];  Service: Orthopedics;  Laterality: Right;  Right Great Toe Amputation   AMPUTATION  Right 09/18/2012   Procedure: Right Foot 2nd Ray Amputation;  Surgeon: Newt Minion, MD;  Location: [place];  Service: Orthopedics;  Laterality: Right;  Right Foot 2nd Ray Amputation   AMPUTATION Right 10/14/2012   Procedure: AMPUTATION FOOT;  Surgeon: Newt Minion, MD;  Location: [place];  Service: Orthopedics;  Laterality: Right;  Right Foot Transmetatarsal Amputation   AMPUTATION Left 09/23/2014   Procedure: Left Foot Transmetatarsal Amputation;  Surgeon: Newt Minion, MD;  Location: [place];  Service: Orthopedics;  Laterality: Left;   AMPUTATION Left 11/22/2020   Procedure: LEFT BELOW KNEE AMPUTATION;  Surgeon: Newt Minion, MD;  Location: [place];  Service: Orthopedics;  Laterality: Left;   BACK SURGERY     lower   CARPAL TUNNEL RELEASE Right 01/30/2018   Procedure: RIGHT CARPAL TUNNEL RELEASE;  Surgeon: Newt Minion, MD;  Location: [place];  Service: Orthopedics;  Laterality: Right;   CIRCUMCISION  2010   COLONOSCOPY     FOOT FRACTURE SURGERY Left 1970's   "broke it playing football" (10/12/2012)   HUMERUS FRACTURE SURGERY W/ IMPLANT Right 1960's   "put a plate in it" (1/44/3154)   I & D EXTREMITY Right 03/24/2020   Procedure: EXCISION RIGHT MEDIAL CUNEFORM;  Surgeon: Newt Minion, MD;  Location: [place];  Service: Orthopedics;  Laterality: Right;   INCISION AND  DRAINAGE OF WOUND Left 2006  ° "foot" (10/12/2012)  ° LUMBAR DISC SURGERY  2008  ° ° °There were no vitals filed for this visit. ° ° Subjective Assessment - 08/01/21 0848   ° ° Subjective relays no issues or complaints. Agrees to discharge today and he feels his RW is now set up right for him after PT adjusted it.   ° Patient is accompained by: Family member   niece  ° Pertinent History arthritis, glaucoma, HTN, osteomyelitis, PVD, DM2, CVA cerebellum, right toe amputations, NSTEMI, gout   ° Patient Stated Goals use prosthesis to go into community including church & fishing.   ° °  °  ° °  ° ° ° ° ° ° ° ° ° ° ° ° ° ° ° ° ° ° ° ° [facility] Adult PT  Treatment/Exercise - 08/01/21 0001   ° °  ° Transfers  ° Transfers Sit to Stand;Stand to Sit;Stand Pivot Transfers   ° Sit to Stand 5: Supervision;With upper extremity assist;From chair/3-in-1;Other (comment)   chairs without armrests  ° Stand to Sit To chair/3-in-1;Other (comment);5: Supervision;With upper extremity assist   chairs without armrests.  °  ° Ambulation/Gait  ° Ambulation/Gait Yes   ° Ambulation/Gait Assistance 5: Supervision   ° Ambulation Distance (Feet) 305 Feet   100 first then adjusted walker and added tennis balls then 305 max distance tolerated  ° Assistive device Rolling walker;Prosthesis   °  ° Neuro Re-ed   ° Neuro Re-ed Details  balance on airex pad 2 min with intermitt UE support, sidestepping and retro walking with bilat UE support X 5 ea   °  ° Knee/Hip Exercises: Machines for Strengthening  ° Total Gym Leg Press shuttle BLEs position 3 with 75# 15reps 3 sets, cues for full ROM, then Rt leg only 37#  X15  and Lt leg only 12# X15   ° °  °  ° °  ° ° ° ° ° ° ° ° ° ° ° ° PT Short Term Goals - 07/12/21 0854   ° °  ° PT SHORT TERM GOAL #1  ° Title Patient donnes prosthesis without cues or physical assist.   ° Baseline still requires assistance due to poor vision and demenita, niece reports he does not wear it on non therapy days   ° Time 4   ° Period Weeks   ° Status On-going   ° Target Date 07/05/21   °  ° PT SHORT TERM GOAL #2  ° Title Patient tolerates prosthesis >8 hrs total /day without skin issues or limb pain   ° Baseline wears it about 6 hours on therapy days and not much on non therapy days per niece report   ° Time 4   ° Period Weeks   ° Status On-going   ° Target Date 07/05/21   °  ° PT SHORT TERM GOAL #3  ° Title Patient able to transfer sit/stand std chair height without armrests using UEs to push to RW safely.   ° Baseline met 1/26 with min A with this   ° Time 4   ° Period Weeks   ° Status Achieved   ° Target Date 07/05/21   °  ° PT SHORT TERM GOAL #4  ° Title Patient ambulates  200' with RW & prosthesis with supervision.   ° Baseline up to 300' with RW and prosthesis with supervision mostly but does need verbal cues for obstable navigation due to poor visiion   °   Time 4    Period Weeks    Status Achieved    Target Date 07/05/21      PT SHORT TERM GOAL #5   Title .Patient negotiates ramps & curbs with RW & prosthesis with minA from niece with PT giving cues    Baseline mod A at times for balance to clear Lt leg stepping up onto curb    Time 4    Period Weeks    Status On-going    Target Date 07/05/21               PT Long Term Goals - 08/01/21 0854       PT LONG TERM GOAL #1   Title Patient & family are independent with prosthetic care and pt able to donne prosthesis modified independent.    Baseline can do this with help of family    Time 86    Period Weeks    Status Achieved    Target Date 08/02/21      PT LONG TERM GOAL #2   Title Patient tolerates prosthesis wear >90% of awake hours without skin or limb pain issues.    Baseline does not wear on non terapy days or if he does not have family help    Time 24    Period Weeks    Status Not Met    Target Date 08/02/21      PT LONG TERM GOAL #3   Title Berg Balance >/= 36/56 with RW support to indicate lower fall risk    Baseline 07/30/2021  Berg task with RW = 37/56    Time 12    Period Weeks    Status Achieved    Target Date 08/02/21      PT LONG TERM GOAL #4   Title Patient ambulates with RW & prosthesis 300' with supervision and household simulation 7' modified independent.    Baseline can walk 300 ft with supervision but cannot achieve mod I due to dementia and vision loss    Time 12    Period Weeks    Status Not Met    Target Date 08/02/21      PT LONG TERM GOAL #5   Title Patient negotiates ramps & curbs with RW & prosthesis with minA from niece for community mobility    Baseline still needs min to mod A    Time 12    Period Weeks    Status Not Met    Target Date 08/02/21                    Plan - 08/01/21 0851     Clinical Impression Statement He has progressed with overall balance and ambulation/standing tolerance however he has plateaued in progress and his levels of independence/assistance due to ongoing dementia and lack of vision. He did bring in his new RW today and I adjusted it and set him up for this for supervised ambulation at home. We will discharge today due to plateau and maximal functional level we can accomplish at this time. He had no furhter questions or concerns at this time.    Personal Factors and Comorbidities Comorbidity 3+;Past/Current Experience;Other   lives alone   Comorbidities arthritis, glaucoma, HTN, osteomyelitis, PVD, DM2, CVA cerebellum, right transmetatarsal amputation, NSTEMI, gout    Examination-Activity Limitations Locomotion Level;Reach Overhead;Stairs;Stand;Toileting;Transfers    Examination-Participation Restrictions Community Activity    Stability/Clinical Decision Making Evolving/Moderate complexity    Rehab Potential Good    PT Frequency  2x / week    PT Duration 12 weeks    PT Treatment/Interventions ADLs/Self Care Home Management;DME Instruction;Gait training;Stair training;Functional mobility training;Therapeutic activities;Therapeutic exercise;Balance training;Neuromuscular re-education;Patient/family education;Prosthetic Training;Manual techniques;Passive range of motion;Vestibular    PT Next Visit Plan DC today    Consulted and Agree with Plan of Care Patient;Family member/caregiver    Family Member Consulted neice, Arlana Lindau             Patient will benefit from skilled therapeutic intervention in order to improve the following deficits and impairments:  Abnormal gait, Decreased activity tolerance, Decreased balance, Decreased cognition, Decreased endurance, Decreased knowledge of use of DME, Decreased mobility, Decreased range of motion, Decreased strength, Impaired flexibility, Postural  dysfunction, Prosthetic Dependency  Visit Diagnosis: Unsteadiness on feet  Muscle weakness (generalized)  Other abnormalities of gait and mobility  Stiffness of left knee, not elsewhere classified  Abnormal posture     Problem List Patient Active Problem List   Diagnosis Date Noted   Prostate cancer ([place]) 06/28/2021   Abscess of bursa of left ankle 11/22/2020   Subacute osteomyelitis, left ankle and foot ([place])    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 ([place]) 04/12/2020   Acute encephalopathy 03/20/2020   SIRS (systemic inflammatory response syndrome) ([place]) 03/20/2020   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[place]) 03/20/2020   ARF (acute renal failure) ([place]) 03/20/2020   Midfoot ulcer, left, limited to breakdown of skin ([place]) 12/29/2018   Occipital stroke ([place]) 10/31/2018   DM2 (diabetes mellitus, type 2) ([place]) 10/31/2018   NSTEMI (non-ST elevated myocardial infarction) ([place]) 05/28/2018   Syncope 05/28/2018   HTN (hypertension) 05/28/2018   Hyperlipemia, mixed 05/28/2018   Rash and nonspecific skin eruption 12/16/2017   Pain in right hand 09/11/2017   Carpal tunnel syndrome, right upper limb 07/01/2017   Arthritis of carpometacarpal [facility]) joint of left thumb 12/30/2016   Midfoot skin ulcer, right, limited to breakdown of skin ([place]) 07/22/2016   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/23/2014   Osteomyelitis of ankle or foot, left, acute ([place]) 08/23/2014   Cellulitis 10/12/2012   Chest pain 10/12/2012   Tobacco abuse 10/12/2012   ABSCESS, AXILLA, LEFT 02/23/2008   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   CHEST WALL PAIN, ACUTE 12/03/2007   CANDIDIASIS, GLANS PENIS 09/16/2007   HEADACHE 08/04/2007   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

## 2021-08-02 ENCOUNTER — Ambulatory Visit: Payer: Medicare Other | Admitting: *Deleted

## 2021-08-07 ENCOUNTER — Other Ambulatory Visit (HOSPITAL_COMMUNITY): Payer: Self-pay

## 2021-08-23 DIAGNOSIS — E1142 Type 2 diabetes mellitus with diabetic polyneuropathy: Secondary | ICD-10-CM | POA: Diagnosis not present

## 2021-08-23 DIAGNOSIS — E785 Hyperlipidemia, unspecified: Secondary | ICD-10-CM | POA: Diagnosis not present

## 2021-08-24 ENCOUNTER — Telehealth: Payer: Self-pay | Admitting: Oncology

## 2021-08-24 NOTE — Telephone Encounter (Signed)
Rescheduled 03/14 appointment to 03/28 per patient's request.  ?

## 2021-08-28 ENCOUNTER — Encounter: Payer: Self-pay | Admitting: *Deleted

## 2021-08-28 ENCOUNTER — Telehealth: Payer: Self-pay | Admitting: *Deleted

## 2021-08-28 ENCOUNTER — Ambulatory Visit: Payer: Medicare Other | Admitting: Oncology

## 2021-08-28 ENCOUNTER — Other Ambulatory Visit: Payer: Medicare Other

## 2021-08-28 ENCOUNTER — Ambulatory Visit: Payer: Self-pay | Admitting: *Deleted

## 2021-08-28 NOTE — Patient Outreach (Signed)
?  Care Management  ? ?Follow Up Note ? ? ?08/28/2021 ? ?Name: [name] MRN: [id number] DOB: 1940-11-05 ? ?Referred By: [name], MD ? ?Reason for Referral: Chronic Care Management Needs in Patient with Hyperlipidemia, Acute Renal Failure, Gout, Type II Diabetes Mellitus, Occipital Stroke, and Hypertension. ? ?An unsuccessful telephone outreach was attempted today. The patient was referred to the case management team for assistance with care management and care coordination. HIPAA compliant messages left on voicemail, including contact information, encouraging patient to return LCSW's call at his earliest convenience. ? ?Follow-Up Plan: We have been unable to make contact with the patient for initial outreach. The care management team is available to follow-up with the patient after provider conversation with the patient regarding recommendation for care management engagement and subsequent re-referral to the care management team.  ? ?Nat Christen, BSW, MSW, LCSW  ?Licensed Clinical Social Worker  ?[facility]  ?Mailing Address-[address] [place], [place] [address] ?Physical Address-[address], [place], [place] [address] ?Toll Free Main # [phone] ?Fax # [phone] ?Cell # [phone] ?[email or web address] ? ? ? ? ? ?  ?

## 2021-09-11 ENCOUNTER — Other Ambulatory Visit: Payer: Self-pay

## 2021-09-11 ENCOUNTER — Inpatient Hospital Stay: Payer: Medicare Other | Attending: Oncology

## 2021-09-11 ENCOUNTER — Inpatient Hospital Stay (HOSPITAL_BASED_OUTPATIENT_CLINIC_OR_DEPARTMENT_OTHER): Payer: Medicare Other | Admitting: Oncology

## 2021-09-11 VITALS — BP 110/65 | HR 66 | Temp 97.8°F | Resp 17 | Ht 73.0 in

## 2021-09-11 DIAGNOSIS — C7951 Secondary malignant neoplasm of bone: Secondary | ICD-10-CM | POA: Insufficient documentation

## 2021-09-11 DIAGNOSIS — C61 Malignant neoplasm of prostate: Secondary | ICD-10-CM

## 2021-09-11 DIAGNOSIS — Z79899 Other long term (current) drug therapy: Secondary | ICD-10-CM | POA: Diagnosis not present

## 2021-09-11 DIAGNOSIS — I10 Essential (primary) hypertension: Secondary | ICD-10-CM | POA: Diagnosis not present

## 2021-09-11 LAB — CBC WITH DIFFERENTIAL (CANCER CENTER ONLY)
Abs Immature Granulocytes: 0.02 10*3/uL (ref 0.00–0.07)
Basophils Absolute: 0 10*3/uL (ref 0.0–0.1)
Basophils Relative: 1 %
Eosinophils Absolute: 0.5 10*3/uL (ref 0.0–0.5)
Eosinophils Relative: 11 %
HCT: 33.9 % — ABNORMAL LOW (ref 39.0–52.0)
Hemoglobin: 11.2 g/dL — ABNORMAL LOW (ref 13.0–17.0)
Immature Granulocytes: 1 %
Lymphocytes Relative: 24 %
Lymphs Abs: 1.1 10*3/uL (ref 0.7–4.0)
MCH: 28.2 pg (ref 26.0–34.0)
MCHC: 33 g/dL (ref 30.0–36.0)
MCV: 85.4 fL (ref 80.0–100.0)
Monocytes Absolute: 0.5 10*3/uL (ref 0.1–1.0)
Monocytes Relative: 12 %
Neutro Abs: 2.3 10*3/uL (ref 1.7–7.7)
Neutrophils Relative %: 51 %
Platelet Count: 219 10*3/uL (ref 150–400)
RBC: 3.97 MIL/uL — ABNORMAL LOW (ref 4.22–5.81)
RDW: 15.9 % — ABNORMAL HIGH (ref 11.5–15.5)
WBC Count: 4.4 10*3/uL (ref 4.0–10.5)
nRBC: 0 % (ref 0.0–0.2)

## 2021-09-11 LAB — CMP (CANCER CENTER ONLY)
ALT: 6 U/L (ref 0–44)
AST: 10 U/L — ABNORMAL LOW (ref 15–41)
Albumin: 3.9 g/dL (ref 3.5–5.0)
Alkaline Phosphatase: 130 U/L — ABNORMAL HIGH (ref 38–126)
Anion gap: 8 (ref 5–15)
BUN: 20 mg/dL (ref 8–23)
CO2: 26 mmol/L (ref 22–32)
Calcium: 9.6 mg/dL (ref 8.9–10.3)
Chloride: 104 mmol/L (ref 98–111)
Creatinine: 1.13 mg/dL (ref 0.61–1.24)
GFR, Estimated: >60 mL/min (ref >60)
Glucose, Bld: 91 mg/dL (ref 70–99)
Potassium: 4.1 mmol/L (ref 3.5–5.1)
Sodium: 138 mmol/L (ref 135–145)
Total Bilirubin: 0.6 mg/dL (ref 0.3–1.2)
Total Protein: 8.5 g/dL — ABNORMAL HIGH (ref 6.5–8.1)

## 2021-09-11 NOTE — Progress Notes (Signed)
Hematology and Oncology Follow Up Visit ? ?[name] ?[id number] ?1940-12-23 81 y.o. ?09/11/2021 8:47 AM ?[name], MDVaradarajan, [name],*  ? ?Principle Diagnosis: 81 year old man with castration-sensitive advanced prostate cancer with disease to the bone and lymphadenopathy diagnosed December 2022.  He presented with a PSA of 14 and a Gleason 9. ? ? ?Prior Therapy: ? ?Prostate biopsy completed on May 21, 2021 which showed a Gleason score 4+5 = 9. ? ?Current therapy:  ? ?Eligard 30 mg every 4 months started in January 2023.  Next injection is in May 2023. ? ?Xtandi 160 mg daily prescribed on June 30, 2021 but has not been taking it. ? ?Interim History: [name] returns today for a follow-up visit.  Since the last visit, he reports no major changes in his health.  He was prescribed Xtandi and made available to him but he has not started.  It is unclear to me why he has not taken this medication and he claims that he did not receive it.  According to his family, the medication is available and ready to be given.  He denies any bone pain or pathological fractures.  He denies any hospitalizations or illnesses. ? ? ? ? ?Medications: I have reviewed the patient's current medications.  ?Current Outpatient Medications  ?Medication Sig Dispense Refill  ? atorvastatin (LIPITOR) 80 MG tablet Take 80 mg by mouth daily.    ? brimonidine (ALPHAGAN) 0.2 % ophthalmic solution Place 1 drop into the right eye 2 (two) times daily.    ? clopidogrel (PLAVIX) 75 MG tablet Take 1 tablet (75 mg total) by mouth daily. 30 tablet 0  ? cycloSPORINE (RESTASIS) 0.05 % ophthalmic emulsion Place 1 drop into both eyes 2 (two) times daily.    ? Dorzolamide HCl-Timolol Mal PF 2-0.5 % SOLN Place 1 drop into both eyes 2 (two) times daily.    ? dorzolamidel-timolol (COSOPT) 22.3-6.8 MG/ML SOLN ophthalmic solution Place 1 drop into the right eye 2 (two) times daily.    ? enzalutamide (XTANDI) 40 MG capsule Take 4 capsules (160  mg total) by mouth daily. 120 capsule 0  ? gabapentin (NEURONTIN) 300 MG capsule Take 1 capsule (300 mg total) by mouth at bedtime. 90 capsule 3  ? hydroxypropyl methylcellulose / hypromellose (ISOPTO TEARS / GONIOVISC) 2.5 % ophthalmic solution Place 1 drop into both eyes 3 (three) times daily as needed for dry eyes. 15 mL 12  ? ketorolac (ACULAR) 0.5 % ophthalmic solution Place 1 drop into both eyes daily.    ? oxyCODONE-acetaminophen (PERCOCET/ROXICET) 5-325 MG tablet Take 1 tablet by mouth every 6 (six) hours as needed for severe pain. 15 tablet 0  ? ?No current facility-administered medications for this visit.  ? ? ? ?Allergies: No Known Allergies ? ? ? ?Physical Exam: ?Blood pressure 110/65, pulse 66, temperature 97.8 ?F (36.6 ?C), temperature source Temporal, resp. rate 17, height '6\' 1"'$  (1.854 m), SpO2 100 %. ? ?ECOG: 1 ? ? ? ?General appearance: Comfortable appearing without any discomfort ?Head: Normocephalic without any trauma ?Oropharynx: Mucous membranes are moist and pink without any thrush or ulcers. ?Eyes: Pupils are equal and round reactive to light. ?Lymph nodes: No cervical, supraclavicular, inguinal or axillary lymphadenopathy.   ?Heart:regular rate and rhythm.  S1 and S2 without leg edema. ?Lung: Clear without any rhonchi or wheezes.  No dullness to percussion. ?Abdomin: Soft, nontender, nondistended with good bowel sounds.  No hepatosplenomegaly. ?Musculoskeletal: No joint deformity or effusion.  Full range of motion noted. ?Neurological: No deficits noted  on motor, sensory and deep tendon reflex exam. ?Skin: No petechial rash or dryness.  Appeared moist.  ? ? ? ?Lab Results: ?Lab Results  ?Component Value Date  ? WBC 6.1 03/13/2021  ? HGB 11.8 (L) 03/13/2021  ? HCT 37.1 (L) 03/13/2021  ? MCV 87.7 03/13/2021  ? PLT 316 03/13/2021  ? ?  Chemistry   ?   ?Component Value Date/Time  ? NA 134 (L) 03/13/2021 1246  ? K 4.2 03/13/2021 1246  ? CL 100 03/13/2021 1246  ? CO2 26 03/13/2021 1246  ? BUN 13  03/13/2021 1246  ? CREATININE 1.01 03/13/2021 1246  ? CREATININE 1.28 09/06/2014 1623  ?    ?Component Value Date/Time  ? CALCIUM 9.4 03/13/2021 1246  ? ALKPHOS 57 03/13/2021 1246  ? AST 15 03/13/2021 1246  ? ALT 10 03/13/2021 1246  ? BILITOT 0.5 03/13/2021 1246  ?  ? ? ? ? ?Impression and Plan: ? ?81 year old with: ? ?1.  Advanced prostate cancer with disease to the bone and lymphadenopathy diagnosed in December 2022.  He has castration-sensitive with PSA of 410 and a Gleason score of 9.   ?  ?He is currently on Xtandi in addition to androgen deprivation he is started in January of 2023.  Risks and benefits of continuing this treatment were discussed at this time.  Complication clued hypertension, fatigue and constitutional symptoms were reiterated.  Seizures is rare but has been documented previously.  Alternative treatment options including abiraterone and systemic chemotherapy were reiterated.  After discussion today, he is willing to proceed with this treatment.  I emphasized the importance of adhering to cancer treatment for better results. ?  ?2.  Androgen deprivation: I recommended continuing this indefinitely.  He will receive Eligard in May 2023 and repeated in 4 months. ?  ?3.  Bone directed therapy: Risks and benefits of Xgeva were reiterated.  Complications including osteonecrosis of the jaw and hypocalcemia were reviewed.  I recommended starting calcium and vitamin D supplements and obtaining dental clearance before consideration. ?  ?4.  Pain: Related to his metastatic disease to the bone.  Palliative radiation option could be used. ? ?5.  Prognosis and goals of care: Therapy is palliative at this time and his disease is incurable.  He has not measures are warranted currently. ? ?  ?6.  Follow-up: In 2 months for repeat evaluation. ?  ?  ?30  minutes were spent on this encounter.  The time was dedicated to reviewing his disease status, addressing complications related to cancer, cancer therapy and  future plan of care review. ? ?Zola Button, MD ?3/28/20238:47 AM ? ?

## 2021-09-12 LAB — PROSTATE-SPECIFIC AG, SERUM (LABCORP): Prostate Specific Ag, Serum: 46.1 ng/mL — ABNORMAL HIGH (ref 0.0–4.0)

## 2021-09-18 DIAGNOSIS — Z8673 Personal history of transient ischemic attack (TIA), and cerebral infarction without residual deficits: Secondary | ICD-10-CM | POA: Diagnosis not present

## 2021-09-18 DIAGNOSIS — G629 Polyneuropathy, unspecified: Secondary | ICD-10-CM | POA: Diagnosis not present

## 2021-09-18 DIAGNOSIS — E1169 Type 2 diabetes mellitus with other specified complication: Secondary | ICD-10-CM | POA: Diagnosis not present

## 2021-09-18 DIAGNOSIS — E785 Hyperlipidemia, unspecified: Secondary | ICD-10-CM | POA: Diagnosis not present

## 2021-10-17 ENCOUNTER — Telehealth: Payer: Self-pay | Admitting: Orthopedic Surgery

## 2021-10-17 NOTE — Telephone Encounter (Signed)
Pt called. Wanting to speak about his leg and ankle pain. He is in a lot of pain and says in "important".  ?

## 2021-10-17 NOTE — Telephone Encounter (Signed)
Patient called asked if he can get something sent over for pain? Patient said he is having a lot of pain in his left leg and foot.  The number to contact patient is [phone]  ?

## 2021-10-18 NOTE — Telephone Encounter (Signed)
I SW pt's niece, she says she didn't know anything about leg or ankle pain. He is more than likely requesting pain meds. He hasn't been seen since 03/2021. I scheduled him an appt next Monday morning for eval. ?

## 2021-10-19 MED ORDER — ACETAMINOPHEN-CODEINE #3 300-30 MG PO TABS: 1.0000 | ORAL_TABLET | Freq: Four times a day (QID) | ORAL | 0 refills | Status: DC | PRN

## 2021-10-22 ENCOUNTER — Ambulatory Visit (INDEPENDENT_AMBULATORY_CARE_PROVIDER_SITE_OTHER): Payer: Medicare Other | Admitting: Orthopedic Surgery

## 2021-10-22 DIAGNOSIS — Z89512 Acquired absence of left leg below knee: Secondary | ICD-10-CM

## 2021-10-22 MED ORDER — GABAPENTIN 300 MG PO CAPS: 300.0000 mg | ORAL_CAPSULE | Freq: Every day | ORAL | 3 refills | Status: DC

## 2021-10-23 ENCOUNTER — Encounter: Payer: Self-pay | Admitting: Orthopedic Surgery

## 2021-10-23 NOTE — Progress Notes (Signed)
? ?Office Visit Note ?  ?Patient: [name]           ?Date of Birth: 1940/08/02           ?MRN: [id number] ?Visit Date: 10/22/2021 ?             ?Requested by: [name], MD ?[address] ?[address] ?[place],  [place] [address] ?PCP: [name], MD ? ?Chief Complaint  ?Patient presents with  ? Right Ankle - Pain  ? ? ? ? ?HPI: ?Patient is a 81 year old gentleman with a left transtibial amputation complains of pain over the tibial crest.  Patient states he has been having some neuropathy pain and has not used his Neurontin. ? ?Assessment & Plan: ?Visit Diagnoses:  ?1. S/P BKA (below knee amputation) unilateral, left (HCC)   ? ? ?Plan: Patient seems to be having neuropathy pain.  A prescription was called in for Neurontin to start 300 mg nightly and may increase up to 3 times daily as needed. ? ?Follow-Up Instructions: Return if symptoms worsen or fail to improve.  ? ?Ortho Exam ? ?Patient is alert, oriented, no adenopathy, well-dressed, normal affect, normal respiratory effort. ?Examination the tibial crest shows no redness no swelling no signs of infection no open wounds.  The residual limb is well consolidated.  Patient is not using his prosthesis. ? ?Imaging: ?No results found. ?No images are attached to the encounter. ? ?Labs: ?Lab Results  ?Component Value Date  ? HGBA1C 5.7 (H) 03/21/2020  ? HGBA1C 5.4 11/01/2018  ? HGBA1C 5.2 05/29/2018  ? ESRSEDRATE 50 (H) 03/20/2020  ? ESRSEDRATE 50 (H) 03/17/2020  ? ESRSEDRATE 34 (H) 12/17/2017  ? CRP 13.5 (H) 03/25/2020  ? CRP 0.8 03/17/2020  ? CRP 17.1 (H) 12/17/2017  ? REPTSTATUS 03/25/2020 FINAL 03/19/2020  ? REPTSTATUS 03/25/2020 FINAL 03/19/2020  ? REPTSTATUS 03/21/2020 FINAL 03/19/2020  ? GRAMSTAIN  03/17/2020  ?  FEW WBC PRESENT, PREDOMINANTLY PMN ?FEW GRAM POSITIVE COCCI ?  ? CULT  03/19/2020  ?  NO GROWTH 5 DAYS ?Performed at [facility], [place] [address], [place], [place] [address] ?  ? CULT  03/19/2020  ?  NO GROWTH 5  DAYS ?Performed at [facility], [place] [address], [place], [place] [address] ?  ? CULT (A) 03/19/2020  ?  <10,000 COLONIES/mL INSIGNIFICANT GROWTH ?Performed at [facility], [place] [address] [place], [place] [address] ?  ? LABORGA METHICILLIN RESISTANT STAPHYLOCOCCUS AUREUS 03/17/2020  ? ? ? ?Lab Results  ?Component Value Date  ? ALBUMIN 3.9 09/11/2021  ? ALBUMIN 3.4 (L) 03/13/2021  ? ALBUMIN 3.5 03/19/2020  ? ? ?Lab Results  ?Component Value Date  ? MG 1.9 03/27/2020  ? MG 1.6 (L) 03/26/2020  ? MG 1.4 (L) 03/25/2020  ? ?No results found for: VD25OH ? ?No results found for: PREALBUMIN ? ?  Latest Ref Rng & Units 09/11/2021  ?  8:57 AM 03/13/2021  ? 12:46 PM 11/23/2020  ?  1:36 AM  ?CBC EXTENDED  ?WBC 4.0 - 10.5 K/uL 4.4   6.1   12.6    ?RBC 4.22 - 5.81 MIL/uL 3.97   4.23   3.52    ?Hemoglobin 13.0 - 17.0 g/dL 11.2   11.8   9.9    ?HCT 39.0 - 52.0 % 33.9   37.1   30.0    ?Platelets 150 - 400 K/uL 219   316   293    ?NEUT# 1.7 - 7.7 K/uL 2.3  3.5     ?Lymph# 0.7 - 4.0 K/uL 1.1   1.0     ? ? ? ?There is no height or weight on file to calculate BMI. ? ?Orders:  ?No orders of the defined types were placed in this encounter. ? ?Meds ordered this encounter  ?Medications  ? gabapentin (NEURONTIN) 300 MG capsule  ?  Sig: Take 1 capsule (300 mg total) by mouth at bedtime.  ?  Dispense:  90 capsule  ?  Refill:  3  ? ? ? Procedures: ?No procedures performed ? ?Clinical Data: ?No additional findings. ? ?ROS: ? ?All other systems negative, except as noted in the HPI. ?Review of Systems ? ?Objective: ?Vital Signs: There were no vitals taken for this visit. ? ?Specialty Comments:  ?No specialty comments available. ? ?PMFS History: ?Patient Active Problem List  ? Diagnosis Date Noted  ? Prostate cancer ([place]) 06/28/2021  ? Abscess of bursa of left ankle 11/22/2020  ? Subacute osteomyelitis, left ankle and foot ([place])   ? 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 ([place]) 04/12/2020  ? Acute encephalopathy 03/20/2020  ?  SIRS (systemic inflammatory response syndrome) ([place]) 03/20/2020  ? 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[place]) 03/20/2020  ? ARF (acute renal failure) ([place]) 03/20/2020  ? Midfoot ulcer, left, limited to breakdown of skin ([place]) 12/29/2018  ? Occipital stroke ([place]) 10/31/2018  ? DM2 (diabetes mellitus, type 2) ([place]) 10/31/2018  ? NSTEMI (non-ST elevated myocardial infarction) ([place]) 05/28/2018  ? Syncope 05/28/2018  ? HTN (hypertension) 05/28/2018  ? Hyperlipemia, mixed 05/28/2018  ? Rash and nonspecific skin eruption 12/16/2017  ? Pain in right hand 09/11/2017  ? Carpal tunnel syndrome, right upper limb 07/01/2017  ? Arthritis of carpometacarpal [facility]) joint of left thumb 12/30/2016  ? Midfoot skin ulcer, right, limited to breakdown of skin ([place]) 07/22/2016  ? 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[place]) 09/23/2014  ? Osteomyelitis of ankle or foot, left, acute ([place]) 08/23/2014  ? Cellulitis 10/12/2012  ? Chest pain 10/12/2012  ? Tobacco abuse 10/12/2012  ? ABSCESS, AXILLA, LEFT 02/23/2008  ? 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 CHEST WALL PAIN, ACUTE 12/03/2007  ? CANDIDIASIS, GLANS PENIS 09/16/2007  ? HEADACHE 08/04/2007  ? 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 Other and unspecified hyperlipidemia 03/05/2007  ? GOUT 03/05/2007  ? ERECTILE DYSFUNCTION 03/05/2007  ? EXTERNAL HEMORRHOIDS 03/05/2007  ? VENTRAL HERNIA 03/05/2007  ? FATTY LIVER DISEASE 03/05/2007  ? Osteoarthritis 03/05/2007  ? HEMORRHOIDS, INTERNAL 03/17/2005  ? COLONIC POLYPS, HX OF 03/17/2005  ? ?Past Medical History:  ?Diagnosis Date  ? Arthritis   ? Denies  ? Glaucoma   ? High cholesterol   ? Hypertension   ? denies  ? Osteomyelitis ([place])   ? Right great toe  ? Peripheral vascular disease ([place])   ? diabetic  with osteomylitis  ? Pre-diabetes   ? Stroke (cerebrum) ([place]) 10/2018  ?  ?Family History  ?Problem Relation Age of Onset  ? Diabetes Mellitus II Other   ? Anesthesia  problems Neg Hx   ?  ?Past Surgical History:  ?Procedure Laterality Date  ? AMPUTATION  07/23/2011  ? Procedure: AMPUTATION DIGIT;  Surgeon: Newt Minion, MD;  Location: [facility];  Service: Orthopedics;  Laterality: Right;  Right Great Toe Amputation  ? AMPUTATION Right 09/18/2012  ? Procedure: Right Foot 2nd Ray Amputation;  Surgeon: Newt Minion, MD;  Location: [facility];  Service: Orthopedics;  Laterality: Right;  Right Foot 2nd Ray Amputation  ? AMPUTATION Right 10/14/2012  ? Procedure: AMPUTATION FOOT;  Surgeon: Newt Minion, MD;  Location: [facility];  Service: Orthopedics;  Laterality: Right;  Right Foot Transmetatarsal Amputation  ? AMPUTATION Left 09/23/2014  ? Procedure: Left Foot Transmetatarsal Amputation;  Surgeon: Newt Minion, MD;  Location: [facility];  Service: Orthopedics;  Laterality: Left;  ? AMPUTATION Left 11/22/2020  ? Procedure: LEFT BELOW KNEE AMPUTATION;  Surgeon: Newt Minion, MD;  Location: [facility];  Service: Orthopedics;  Laterality: Left;  ? BACK SURGERY    ? lower  ? CARPAL TUNNEL RELEASE Right 01/30/2018  ? Procedure: RIGHT CARPAL TUNNEL RELEASE;  Surgeon: Newt Minion, MD;  Location: [facility];  Service: Orthopedics;  Laterality: Right;  ? CIRCUMCISION  2010  ? COLONOSCOPY    ? FOOT FRACTURE SURGERY Left 1970's  ? "broke it playing football" (10/12/2012)  ? HUMERUS FRACTURE SURGERY W/ IMPLANT Right 1960's  ? "put a plate in it" (06/19/7251)  ? I & D EXTREMITY Right 03/24/2020  ? Procedure: EXCISION RIGHT MEDIAL CUNEFORM;  Surgeon: Newt Minion, MD;  Location: [facility];  Service: Orthopedics;  Laterality: Right;  ? INCISION AND DRAINAGE OF WOUND Left 2006  ? "foot" (10/12/2012)  ? [place] SURGERY  2008  ? ?Social History  ? ?Occupational History  ? Not on file  ?Tobacco Use  ? Smoking status: Every Day  ?  Types: Pipe  ?  Passive exposure: Current  ? Smokeless tobacco: Never  ? Tobacco comments:  ?  Uses 2-3 barrels a day.  ?Vaping Use  ? Vaping Use: Never used  ?Substance and Sexual Activity  ? Alcohol use:  No  ?  Alcohol/week: 0.0 standard drinks  ? Drug use: No  ? Sexual activity: Not Currently  ? ? ? ? ? ?

## 2021-11-13 ENCOUNTER — Inpatient Hospital Stay: Payer: Medicare Other

## 2021-11-13 ENCOUNTER — Inpatient Hospital Stay: Payer: Medicare Other | Attending: Oncology

## 2021-11-13 ENCOUNTER — Other Ambulatory Visit: Payer: Self-pay

## 2021-11-13 ENCOUNTER — Inpatient Hospital Stay (HOSPITAL_BASED_OUTPATIENT_CLINIC_OR_DEPARTMENT_OTHER): Payer: Medicare Other | Admitting: Oncology

## 2021-11-13 VITALS — BP 104/61 | HR 73 | Temp 97.8°F | Resp 18 | Ht 73.0 in

## 2021-11-13 DIAGNOSIS — Z79621 Long term (current) use of calcineurin inhibitor: Secondary | ICD-10-CM | POA: Diagnosis not present

## 2021-11-13 DIAGNOSIS — C61 Malignant neoplasm of prostate: Secondary | ICD-10-CM

## 2021-11-13 DIAGNOSIS — Z79899 Other long term (current) drug therapy: Secondary | ICD-10-CM | POA: Insufficient documentation

## 2021-11-13 DIAGNOSIS — C7951 Secondary malignant neoplasm of bone: Secondary | ICD-10-CM | POA: Insufficient documentation

## 2021-11-13 LAB — CBC WITH DIFFERENTIAL (CANCER CENTER ONLY)
Abs Immature Granulocytes: 0.03 10*3/uL (ref 0.00–0.07)
Basophils Absolute: 0 10*3/uL (ref 0.0–0.1)
Basophils Relative: 1 %
Eosinophils Absolute: 1 10*3/uL — ABNORMAL HIGH (ref 0.0–0.5)
Eosinophils Relative: 16 %
HCT: 30.1 % — ABNORMAL LOW (ref 39.0–52.0)
Hemoglobin: 9.8 g/dL — ABNORMAL LOW (ref 13.0–17.0)
Immature Granulocytes: 1 %
Lymphocytes Relative: 23 %
Lymphs Abs: 1.4 10*3/uL (ref 0.7–4.0)
MCH: 27.5 pg (ref 26.0–34.0)
MCHC: 32.6 g/dL (ref 30.0–36.0)
MCV: 84.6 fL (ref 80.0–100.0)
Monocytes Absolute: 0.5 10*3/uL (ref 0.1–1.0)
Monocytes Relative: 8 %
Neutro Abs: 3.3 10*3/uL (ref 1.7–7.7)
Neutrophils Relative %: 51 %
Platelet Count: 367 10*3/uL (ref 150–400)
RBC: 3.56 MIL/uL — ABNORMAL LOW (ref 4.22–5.81)
RDW: 15.6 % — ABNORMAL HIGH (ref 11.5–15.5)
WBC Count: 6.3 10*3/uL (ref 4.0–10.5)
nRBC: 0 % (ref 0.0–0.2)

## 2021-11-13 LAB — CMP (CANCER CENTER ONLY)
ALT: 6 U/L (ref 0–44)
AST: 12 U/L — ABNORMAL LOW (ref 15–41)
Albumin: 3.6 g/dL (ref 3.5–5.0)
Alkaline Phosphatase: 143 U/L — ABNORMAL HIGH (ref 38–126)
Anion gap: 6 (ref 5–15)
BUN: 17 mg/dL (ref 8–23)
CO2: 27 mmol/L (ref 22–32)
Calcium: 9.5 mg/dL (ref 8.9–10.3)
Chloride: 106 mmol/L (ref 98–111)
Creatinine: 1.05 mg/dL (ref 0.61–1.24)
GFR, Estimated: >60 mL/min (ref >60)
Glucose, Bld: 109 mg/dL — ABNORMAL HIGH (ref 70–99)
Potassium: 3.5 mmol/L (ref 3.5–5.1)
Sodium: 139 mmol/L (ref 135–145)
Total Bilirubin: 0.4 mg/dL (ref 0.3–1.2)
Total Protein: 7.9 g/dL (ref 6.5–8.1)

## 2021-11-13 MED ORDER — LEUPROLIDE ACETATE (4 MONTH) 30 MG IM KIT: 30.0000 mg | PACK | Freq: Once | INTRAMUSCULAR | Status: DC

## 2021-11-13 MED ORDER — LEUPROLIDE ACETATE (4 MONTH) 30 MG ~~LOC~~ KIT
30.0000 mg | PACK | Freq: Once | SUBCUTANEOUS | Status: AC
Start: 2021-11-13 — End: 2021-11-13
  Administered 2021-11-13: 30 mg via SUBCUTANEOUS
  Filled 2021-11-13: qty 30

## 2021-11-13 NOTE — Progress Notes (Signed)
Hematology and Oncology Follow Up Visit  [name] [id number] Feb 23, 1941 81 y.o. 11/13/2021 8:40 AM [name], MDVaradarajan, Ronie Spies   Principle Diagnosis: 81 year old man with prostate cancer diagnosed in December 2022.  He presented with castration-sensitive advanced disease to the bone and lymphadenopathy with a PSA of over 400 and a Gleason 9.   Prior Therapy:  Prostate biopsy completed on May 21, 2021 which showed a Gleason score 4+5 = 9.  Current therapy:   Eligard 30 mg every 4 months started in January 2023.  He will receive Eligard today and repeated in 4 months.  Xtandi 160 mg daily prescribed on June 30, 2021.  He has now taking it at this time.  Interim History: [name] presents today for a follow-up evaluation.  Since last visit, he reports no major changes in his health.  He denies any bone pain, pathological fractures or recent hospitalizations.  He is eating well and overall does not report any complaints.  I am still unclear about why he has not taken Xtandi at this time.  He claims that he has not received any medication but the accompanying family members insist that he has them and will not take them.     Medications: Updated on review. Current Outpatient Medications  Medication Sig Dispense Refill   acetaminophen-codeine (TYLENOL #3) 300-30 MG tablet Take 1 tablet by mouth every 6 (six) hours as needed for moderate pain. 20 tablet 0   atorvastatin (LIPITOR) 80 MG tablet Take 80 mg by mouth daily.     brimonidine (ALPHAGAN) 0.2 % ophthalmic solution Place 1 drop into the right eye 2 (two) times daily.     clopidogrel (PLAVIX) 75 MG tablet Take 1 tablet (75 mg total) by mouth daily. 30 tablet 0   cycloSPORINE (RESTASIS) 0.05 % ophthalmic emulsion Place 1 drop into both eyes 2 (two) times daily.     Dorzolamide HCl-Timolol Mal PF 2-0.5 % SOLN Place 1 drop into both eyes 2 (two) times daily.     dorzolamidel-timolol (COSOPT) 22.3-6.8  MG/ML SOLN ophthalmic solution Place 1 drop into the right eye 2 (two) times daily.     enzalutamide (XTANDI) 40 MG capsule Take 4 capsules (160 mg total) by mouth daily. 120 capsule 0   gabapentin (NEURONTIN) 300 MG capsule Take 1 capsule (300 mg total) by mouth at bedtime. 90 capsule 3   hydroxypropyl methylcellulose / hypromellose (ISOPTO TEARS / GONIOVISC) 2.5 % ophthalmic solution Place 1 drop into both eyes 3 (three) times daily as needed for dry eyes. 15 mL 12   ketorolac (ACULAR) 0.5 % ophthalmic solution Place 1 drop into both eyes daily.     oxyCODONE-acetaminophen (PERCOCET/ROXICET) 5-325 MG tablet Take 1 tablet by mouth every 6 (six) hours as needed for severe pain. 15 tablet 0   No current facility-administered medications for this visit.     Allergies: No Known Allergies    Physical Exam: Blood pressure 104/61, pulse 73, temperature 97.8 F (36.6 C), temperature source Temporal, resp. rate 18, height '6\' 1"'$  (1.854 m), SpO2 98 %.   ECOG: 1   General appearance: Alert, awake without any distress. Head: Atraumatic without abnormalities Oropharynx: Without any thrush or ulcers. Eyes: No scleral icterus. Lymph nodes: No lymphadenopathy noted in the cervical, supraclavicular, or axillary nodes Heart:regular rate and rhythm, without any murmurs or gallops.   Lung: Clear to auscultation without any rhonchi, wheezes or dullness to percussion. Abdomin: Soft, nontender without any shifting dullness or ascites. Musculoskeletal: No clubbing or cyanosis.  Neurological: No motor or sensory deficits. Skin: No rashes or lesions.    Lab Results: Lab Results  Component Value Date   WBC 4.4 09/11/2021   HGB 11.2 (L) 09/11/2021   HCT 33.9 (L) 09/11/2021   MCV 85.4 09/11/2021   PLT 219 09/11/2021     Chemistry      Component Value Date/Time   NA 138 09/11/2021 0857   K 4.1 09/11/2021 0857   CL 104 09/11/2021 0857   CO2 26 09/11/2021 0857   BUN 20 09/11/2021 0857    CREATININE 1.13 09/11/2021 0857   CREATININE 1.28 09/06/2014 1623      Component Value Date/Time   CALCIUM 9.6 09/11/2021 0857   ALKPHOS 130 (H) 09/11/2021 0857   AST 10 (L) 09/11/2021 0857   ALT 6 09/11/2021 0857   BILITOT 0.6 09/11/2021 0857       Latest Reference Range & Units 03/13/21 12:46 09/11/21 08:57  Prostate Specific Ag, Serum 0.0 - 4.0 ng/mL  46.1 (H)  Prostatic Specific Antigen 0.00 - 4.00 ng/mL 321.00 (H)   (H): Data is abnormally high  Impression and Plan:  81 year old with:  1.  Castration-sensitive advanced prostate cancer with disease to the bone and lymphadenopathy diagnosed in December 2022.       The natural course of his disease was reviewed at this time and treatment choices were discussed.  His PSA declined appropriately continues to respond.  Risks and benefits of continuing Xtandi versus alternative options such as systemic chemotherapy or a PARP inhibitor he harbors the appropriate mutation.  At this time, it is still unclear whether he is willing to take this medication at this time.  Despite multiple discussions it is likely reasonable to proceed with androgen deprivation therapy alone.   2.  Androgen deprivation: He will receive Eligard today and repeated in 4 months.  He is agreeable to proceed.  Complication clinic hot flashes weight gain were reiterated.   3.  Bone directed therapy: I recommended calcium and vitamin D supplements.  Delton See has been deferred till dental clearance is obtained.   4.  Pain: Resolved at this time after the start of therapy.  5.  Prognosis and goals of care: Aggressive measures are warranted but his disease is incurable.    6.  Follow-up: Will be in 4 months for repeat evaluation.     30  minutes were dedicated to this visit.  The time was spent on reviewing laboratory data, disease status update, addressing complications related to cancer and cancer therapy.  Zola Button, MD 5/30/20238:40 AM

## 2021-11-14 LAB — PROSTATE-SPECIFIC AG, SERUM (LABCORP): Prostate Specific Ag, Serum: 102 ng/mL — ABNORMAL HIGH (ref 0.0–4.0)

## 2021-12-21 DIAGNOSIS — E785 Hyperlipidemia, unspecified: Secondary | ICD-10-CM | POA: Diagnosis not present

## 2021-12-21 DIAGNOSIS — E1142 Type 2 diabetes mellitus with diabetic polyneuropathy: Secondary | ICD-10-CM | POA: Diagnosis not present

## 2021-12-31 ENCOUNTER — Encounter: Payer: Self-pay | Admitting: Oncology

## 2021-12-31 ENCOUNTER — Telehealth: Payer: Self-pay

## 2021-12-31 NOTE — Telephone Encounter (Signed)
Spoke with patient's niece Butch Penny and scheduled a Mychart Palliative Consult for 01/03/22 @ 2:30 PM.  Consent obtained; updated Netsmart, Team List and Epic.

## 2022-01-03 ENCOUNTER — Telehealth: Payer: Medicare Other | Admitting: Hospice

## 2022-01-03 DIAGNOSIS — M159 Polyosteoarthritis, unspecified: Secondary | ICD-10-CM

## 2022-01-03 DIAGNOSIS — Z515 Encounter for palliative care: Secondary | ICD-10-CM | POA: Diagnosis not present

## 2022-01-03 DIAGNOSIS — C61 Malignant neoplasm of prostate: Secondary | ICD-10-CM

## 2022-01-03 NOTE — Progress Notes (Signed)
[facility] Consult Note Telephone: [phone]  Fax: [phone]  PATIENT NAME: [name] [id number] [phone] (home)  DOB: [birth year] MRN: [id number]  PRIMARY CARE PROVIDER:    [name], MD,  [name]. [address] [place] [address] [phone]  REFERRING PROVIDER:   [name], MD [address] [place],  [name] [address] [phone]  RESPONSIBLE PARTY:   self [place]     Name Relation Home Work Mobile   tisdale,rita Niece   [phone]   [employer] Nephew [phone]     Gwynneth Macleod Niece   [phone]       TELEHEALTH VISIT STATEMENT Due to the COVID-19 crisis, this visit was done via telemedicine from my office and it was initiated and consent by this patient and or family.  I connected with patient OR PROXY by a telephone/video  and verified that I am speaking with the correct person. I discussed the limitations of evaluation and management by telemedicine. The patient expressed understanding and agreed to proceed. Palliative Care was asked to follow this patient to address advance care planning, complex medical decision making and goals of care clarification. [name] is with patient during visit. This is the initial visit.     ASSESSMENT AND / RECOMMENDATIONS:   Advance Care Planning: Our advance care planning conversation included a discussion about:    The value and importance of advance care planning  Difference between Hospice and Palliative care Exploration of goals of care in the event of a sudden injury or illness  Identification and preparation of a healthcare agent  Review and updating or creation of an  advance directive document . Decision not to resuscitate or to de-escalate disease focused treatments due to poor prognosis.  CODE STATUS: Patient affirmed he is a Do Not  Resuscitate  Goals of Care: Goals include to maximize quality of life and symptom management  I spent 16  minutes providing this initial consultation. More than 50% of the time in this consultation was spent on counseling patient and coordinating communication. --------------------------------------------------------------------------------------------------------------------------------------  Symptom Management/Plan: Prostate CA: mets to bone. On Xtandi but not taking per [name].  Education on need to adhere to plan of care. Follow up with Oncology 02/09/22 for infusion.  Osteoarthritis: multiple joints. Take Tylenol 500 mg TID prn pain. Routine CBC CMP Follow up: Palliative care will continue to follow for complex medical decision making, advance care planning, and clarification of goals. Return 6 weeks or prn. Encouraged to call provider sooner with any concerns.   Family /Caregiver/Community Supports: Patient lives independently at home. [name] and [name] are involved in his care. Plan is for LTC placement; [name] is following up on this.   HOSPICE ELIGIBILITY/DIAGNOSIS: TBD  Chief Complaint: Initial Palliative care visit  HISTORY OF PRESENT ILLNESS:  [name] is a 81 y.o. year old male  with multiple morbidities requiring close monitoring and with high risk of complications and  mortality: Prostate CA with mets to bone, osteoarthritis. 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  History obtained from review of EMR, discussion with primary team, caregiver, family and/or [name].  Review and summarization of Epic records shows history from other than patient. Rest of 10 point ROS asked and negative.  Independent interpretation of tests and reviewed as needed, available labs, patient records, imaging, studies and related documents from the EMR.   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 ([place]) 03/05/2007   Other and unspecified hyperlipidemia 03/05/2007   GOUT 03/05/2007   ERECTILE DYSFUNCTION 03/05/2007   HEMORRHOIDS, INTERNAL 03/17/2005   EXTERNAL HEMORRHOIDS 03/05/2007   VENTRAL HERNIA 03/05/2007   FATTY LIVER DISEASE 03/05/2007   ABSCESS, AXILLA, LEFT 02/23/2008   Osteoarthritis 03/05/2007   HEADACHE 08/04/2007   CHEST WALL PAIN, ACUTE 12/03/2007   COLONIC POLYPS, HX OF 03/17/2005   Cellulitis 10/12/2012   Chest pain 10/12/2012   Tobacco abuse 10/12/2012   Osteomyelitis of ankle or foot, left, acute ([place]) 08/23/2014   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[place]) 09/23/2014   Midfoot skin ulcer, right, limited to breakdown of skin ([place]) 07/22/2016   Arthritis of carpometacarpal ([facility]) joint of left thumb 12/30/2016   Carpal tunnel syndrome, right upper limb 07/01/2017   Pain in right hand 09/11/2017   Rash and nonspecific skin eruption 12/16/2017   NSTEMI (non-ST elevated myocardial infarction) ([place]) 05/28/2018   Syncope 05/28/2018   HTN (hypertension) 05/28/2018   Hyperlipemia, mixed 05/28/2018   Occipital stroke ([place]) 10/31/2018   DM2 (diabetes mellitus, type 2) ([place]) 10/31/2018   Midfoot ulcer, left, limited to breakdown of skin ([place]) 12/29/2018   Acute encephalopathy 03/20/2020   SIRS (systemic inflammatory response syndrome) ([place]) 03/20/2020   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[place]) 03/20/2020   ARF (acute renal failure) ([place]) 03/20/2020   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 ([place]) 04/12/2020   Subacute osteomyelitis, left ankle and foot (H[facility])    Abscess of bursa of left ankle 11/22/2020   Prostate cancer ([place]) 06/28/2021   Resolved Ambulatory Problems    Diagnosis Date Noted   No Resolved Ambulatory Problems   Past Medical History:  Diagnosis Date   Arthritis    Glaucoma    High cholesterol    Hypertension     Osteomyelitis (HCC)    Peripheral vascular disease ([place])    Pre-diabetes    Stroke (cerebrum) ([place]) 10/2018    SOCIAL HX:  Social History   Tobacco Use   Smoking status: Every Day    Types: Pipe    Passive exposure: Current   Smokeless tobacco: Never   Tobacco comments:    Uses 2-3 barrels a day.  Substance Use Topics   Alcohol use: No    Alcohol/week: 0.0 standard drinks of alcohol     FAMILY HX:  Family History  Problem Relation Age of Onset   Diabetes Mellitus II Other    Anesthesia problems Neg Hx       ALLERGIES: No Known Allergies    PERTINENT MEDICATIONS:  Outpatient Encounter Medications as of 01/03/2022  Medication Sig   acetaminophen-codeine (TYLENOL #3) 300-30 MG tablet Take 1 tablet by mouth every 6 (six) hours as needed for moderate pain.   atorvastatin (LIPITOR) 80 MG tablet Take 80 mg by mouth daily.   brimonidine (ALPHAGAN) 0.2 % ophthalmic solution Place 1 drop into the right eye 2 (two) times daily.   clopidogrel (PLAVIX) 75 MG tablet Take 1 tablet (75 mg total) by mouth daily.   cycloSPORINE (RESTASIS) 0.05 % ophthalmic emulsion Place 1 drop into both eyes 2 (two) times daily.   Dorzolamide HCl-Timolol Mal PF 2-0.5 % SOLN Place 1 drop into both eyes 2 (two) times daily.   dorzolamidel-timolol (COSOPT) 22.3-6.8 MG/ML SOLN  ophthalmic solution Place 1 drop into the right eye 2 (two) times daily.   gabapentin (NEURONTIN) 300 MG capsule Take 1 capsule (300 mg total) by mouth at bedtime.   hydroxypropyl methylcellulose / hypromellose (ISOPTO TEARS / GONIOVISC) 2.5 % ophthalmic solution Place 1 drop into both eyes 3 (three) times daily as needed for dry eyes.   ketorolac (ACULAR) 0.5 % ophthalmic solution Place 1 drop into both eyes daily.   oxyCODONE-acetaminophen (PERCOCET/ROXICET) 5-325 MG tablet Take 1 tablet by mouth every 6 (six) hours as needed for severe pain.   No facility-administered encounter medications on file as of 01/03/2022.   Thank you for  the opportunity to participate in the care of Mr. Vallier.  The palliative care team will continue to follow. Please call our office at [phone] if we can be of additional assistance.   Note: Portions of this note were generated with Lobbyist. Dictation errors may occur despite best attempts at proofreading.  Teodoro Spray, NP

## 2022-01-04 DIAGNOSIS — E1169 Type 2 diabetes mellitus with other specified complication: Secondary | ICD-10-CM | POA: Diagnosis not present

## 2022-01-04 DIAGNOSIS — Z0000 Encounter for general adult medical examination without abnormal findings: Secondary | ICD-10-CM | POA: Diagnosis not present

## 2022-01-04 DIAGNOSIS — E440 Moderate protein-calorie malnutrition: Secondary | ICD-10-CM | POA: Diagnosis not present

## 2022-01-04 DIAGNOSIS — E11621 Type 2 diabetes mellitus with foot ulcer: Secondary | ICD-10-CM | POA: Diagnosis not present

## 2022-01-04 DIAGNOSIS — Z89619 Acquired absence of unspecified leg above knee: Secondary | ICD-10-CM | POA: Diagnosis not present

## 2022-01-04 DIAGNOSIS — Z8673 Personal history of transient ischemic attack (TIA), and cerebral infarction without residual deficits: Secondary | ICD-10-CM | POA: Diagnosis not present

## 2022-01-04 DIAGNOSIS — H42 Glaucoma in diseases classified elsewhere: Secondary | ICD-10-CM | POA: Diagnosis not present

## 2022-01-04 DIAGNOSIS — C7951 Secondary malignant neoplasm of bone: Secondary | ICD-10-CM | POA: Diagnosis not present

## 2022-01-04 DIAGNOSIS — R54 Age-related physical debility: Secondary | ICD-10-CM | POA: Diagnosis not present

## 2022-01-04 DIAGNOSIS — D649 Anemia, unspecified: Secondary | ICD-10-CM | POA: Diagnosis not present

## 2022-01-25 ENCOUNTER — Encounter (HOSPITAL_COMMUNITY): Payer: Self-pay

## 2022-01-25 ENCOUNTER — Inpatient Hospital Stay (HOSPITAL_COMMUNITY)
Admission: EM | Admit: 2022-01-25 | Discharge: 2022-01-30 | DRG: 689 | Disposition: A | Discharge status: Hospice | Payer: Medicare Other | Attending: Internal Medicine | Admitting: Internal Medicine

## 2022-01-25 ENCOUNTER — Other Ambulatory Visit: Payer: Self-pay

## 2022-01-25 ENCOUNTER — Emergency Department (HOSPITAL_COMMUNITY): Payer: Medicare Other

## 2022-01-25 DIAGNOSIS — I43 Cardiomyopathy in diseases classified elsewhere: Secondary | ICD-10-CM | POA: Diagnosis not present

## 2022-01-25 DIAGNOSIS — E854 Organ-limited amyloidosis: Secondary | ICD-10-CM | POA: Diagnosis not present

## 2022-01-25 DIAGNOSIS — G9340 Encephalopathy, unspecified: Secondary | ICD-10-CM

## 2022-01-25 DIAGNOSIS — X58XXXA Exposure to other specified factors, initial encounter: Secondary | ICD-10-CM | POA: Diagnosis present

## 2022-01-25 DIAGNOSIS — I110 Hypertensive heart disease with heart failure: Secondary | ICD-10-CM | POA: Diagnosis not present

## 2022-01-25 DIAGNOSIS — Z8673 Personal history of transient ischemic attack (TIA), and cerebral infarction without residual deficits: Secondary | ICD-10-CM | POA: Diagnosis not present

## 2022-01-25 DIAGNOSIS — D630 Anemia in neoplastic disease: Secondary | ICD-10-CM | POA: Diagnosis present

## 2022-01-25 DIAGNOSIS — E440 Moderate protein-calorie malnutrition: Secondary | ICD-10-CM | POA: Diagnosis present

## 2022-01-25 DIAGNOSIS — C61 Malignant neoplasm of prostate: Secondary | ICD-10-CM | POA: Diagnosis not present

## 2022-01-25 DIAGNOSIS — Z515 Encounter for palliative care: Secondary | ICD-10-CM

## 2022-01-25 DIAGNOSIS — I5041 Acute combined systolic (congestive) and diastolic (congestive) heart failure: Secondary | ICD-10-CM | POA: Diagnosis not present

## 2022-01-25 DIAGNOSIS — R5381 Other malaise: Secondary | ICD-10-CM | POA: Diagnosis not present

## 2022-01-25 DIAGNOSIS — T451X5A Adverse effect of antineoplastic and immunosuppressive drugs, initial encounter: Secondary | ICD-10-CM | POA: Diagnosis not present

## 2022-01-25 DIAGNOSIS — E7800 Pure hypercholesterolemia, unspecified: Secondary | ICD-10-CM | POA: Diagnosis not present

## 2022-01-25 DIAGNOSIS — Z66 Do not resuscitate: Secondary | ICD-10-CM | POA: Diagnosis not present

## 2022-01-25 DIAGNOSIS — Z833 Family history of diabetes mellitus: Secondary | ICD-10-CM | POA: Diagnosis not present

## 2022-01-25 DIAGNOSIS — Z8546 Personal history of malignant neoplasm of prostate: Secondary | ICD-10-CM

## 2022-01-25 DIAGNOSIS — R509 Fever, unspecified: Secondary | ICD-10-CM | POA: Diagnosis not present

## 2022-01-25 DIAGNOSIS — M47814 Spondylosis without myelopathy or radiculopathy, thoracic region: Secondary | ICD-10-CM | POA: Diagnosis not present

## 2022-01-25 DIAGNOSIS — Z89511 Acquired absence of right leg below knee: Secondary | ICD-10-CM | POA: Diagnosis not present

## 2022-01-25 DIAGNOSIS — I739 Peripheral vascular disease, unspecified: Secondary | ICD-10-CM

## 2022-01-25 DIAGNOSIS — C7951 Secondary malignant neoplasm of bone: Secondary | ICD-10-CM | POA: Diagnosis not present

## 2022-01-25 DIAGNOSIS — I452 Bifascicular block: Secondary | ICD-10-CM | POA: Diagnosis present

## 2022-01-25 DIAGNOSIS — R778 Other specified abnormalities of plasma proteins: Secondary | ICD-10-CM | POA: Diagnosis present

## 2022-01-25 DIAGNOSIS — I10 Essential (primary) hypertension: Secondary | ICD-10-CM | POA: Diagnosis present

## 2022-01-25 DIAGNOSIS — F03A2 Unspecified dementia, mild, with psychotic disturbance: Secondary | ICD-10-CM | POA: Diagnosis not present

## 2022-01-25 DIAGNOSIS — N390 Urinary tract infection, site not specified: Principal | ICD-10-CM | POA: Diagnosis present

## 2022-01-25 DIAGNOSIS — R410 Disorientation, unspecified: Secondary | ICD-10-CM | POA: Diagnosis not present

## 2022-01-25 DIAGNOSIS — N3000 Acute cystitis without hematuria: Secondary | ICD-10-CM | POA: Diagnosis not present

## 2022-01-25 DIAGNOSIS — Z79899 Other long term (current) drug therapy: Secondary | ICD-10-CM | POA: Diagnosis not present

## 2022-01-25 DIAGNOSIS — D649 Anemia, unspecified: Secondary | ICD-10-CM

## 2022-01-25 DIAGNOSIS — I5020 Unspecified systolic (congestive) heart failure: Secondary | ICD-10-CM | POA: Diagnosis not present

## 2022-01-25 DIAGNOSIS — I9589 Other hypotension: Secondary | ICD-10-CM | POA: Diagnosis not present

## 2022-01-25 DIAGNOSIS — E1151 Type 2 diabetes mellitus with diabetic peripheral angiopathy without gangrene: Secondary | ICD-10-CM | POA: Diagnosis not present

## 2022-01-25 DIAGNOSIS — Z7189 Other specified counseling: Secondary | ICD-10-CM | POA: Diagnosis not present

## 2022-01-25 DIAGNOSIS — I493 Ventricular premature depolarization: Secondary | ICD-10-CM | POA: Diagnosis present

## 2022-01-25 DIAGNOSIS — G9341 Metabolic encephalopathy: Secondary | ICD-10-CM | POA: Diagnosis not present

## 2022-01-25 DIAGNOSIS — Z7902 Long term (current) use of antithrombotics/antiplatelets: Secondary | ICD-10-CM

## 2022-01-25 DIAGNOSIS — F17290 Nicotine dependence, other tobacco product, uncomplicated: Secondary | ICD-10-CM | POA: Diagnosis not present

## 2022-01-25 DIAGNOSIS — Z89512 Acquired absence of left leg below knee: Secondary | ICD-10-CM

## 2022-01-25 DIAGNOSIS — E119 Type 2 diabetes mellitus without complications: Secondary | ICD-10-CM

## 2022-01-25 DIAGNOSIS — H409 Unspecified glaucoma: Secondary | ICD-10-CM | POA: Diagnosis present

## 2022-01-25 DIAGNOSIS — H547 Unspecified visual loss: Secondary | ICD-10-CM | POA: Diagnosis present

## 2022-01-25 LAB — CBC WITH DIFFERENTIAL/PLATELET
Abs Immature Granulocytes: 0.1 10*3/uL — ABNORMAL HIGH (ref 0.00–0.07)
Basophils Absolute: 0 10*3/uL (ref 0.0–0.1)
Basophils Relative: 0 %
Eosinophils Absolute: 0.2 10*3/uL (ref 0.0–0.5)
Eosinophils Relative: 2 %
HCT: 28.8 % — ABNORMAL LOW (ref 39.0–52.0)
Hemoglobin: 9.1 g/dL — ABNORMAL LOW (ref 13.0–17.0)
Immature Granulocytes: 1 %
Lymphocytes Relative: 12 %
Lymphs Abs: 1.1 10*3/uL (ref 0.7–4.0)
MCH: 27 pg (ref 26.0–34.0)
MCHC: 31.6 g/dL (ref 30.0–36.0)
MCV: 85.5 fL (ref 80.0–100.0)
Monocytes Absolute: 1 10*3/uL (ref 0.1–1.0)
Monocytes Relative: 11 %
Neutro Abs: 6.8 10*3/uL (ref 1.7–7.7)
Neutrophils Relative %: 74 %
Platelets: 298 10*3/uL (ref 150–400)
RBC: 3.37 MIL/uL — ABNORMAL LOW (ref 4.22–5.81)
RDW: 17.2 % — ABNORMAL HIGH (ref 11.5–15.5)
WBC: 9.2 10*3/uL (ref 4.0–10.5)
nRBC: 0 % (ref 0.0–0.2)

## 2022-01-25 LAB — COMPREHENSIVE METABOLIC PANEL
ALT: 10 U/L (ref 0–44)
AST: 19 U/L (ref 15–41)
Albumin: 3.4 g/dL — ABNORMAL LOW (ref 3.5–5.0)
Alkaline Phosphatase: 149 U/L — ABNORMAL HIGH (ref 38–126)
Anion gap: 10 (ref 5–15)
BUN: 16 mg/dL (ref 8–23)
CO2: 24 mmol/L (ref 22–32)
Calcium: 9.1 mg/dL (ref 8.9–10.3)
Chloride: 104 mmol/L (ref 98–111)
Creatinine, Ser: 1.04 mg/dL (ref 0.61–1.24)
GFR, Estimated: >60 mL/min (ref >60)
Glucose, Bld: 101 mg/dL — ABNORMAL HIGH (ref 70–99)
Potassium: 3.8 mmol/L (ref 3.5–5.1)
Sodium: 138 mmol/L (ref 135–145)
Total Bilirubin: 0.7 mg/dL (ref 0.3–1.2)
Total Protein: 8 g/dL (ref 6.5–8.1)

## 2022-01-25 LAB — URINALYSIS, ROUTINE W REFLEX MICROSCOPIC
Bilirubin Urine: NEGATIVE
Glucose, UA: NEGATIVE mg/dL
Ketones, ur: NEGATIVE mg/dL
Nitrite: NEGATIVE
Protein, ur: NEGATIVE mg/dL
Specific Gravity, Urine: 1.015 (ref 1.005–1.030)
WBC, UA: >50 WBC/hpf — ABNORMAL HIGH (ref 0–5)
pH: 5 (ref 5.0–8.0)

## 2022-01-25 LAB — TROPONIN I (HIGH SENSITIVITY)
Troponin I (High Sensitivity): 139 ng/L (ref <18)
Troponin I (High Sensitivity): 160 ng/L (ref <18)

## 2022-01-25 LAB — CK: Total CK: 99 U/L (ref 49–397)

## 2022-01-25 MED ORDER — KETOROLAC TROMETHAMINE 0.5 % OP SOLN: 1.0000 [drp] | Freq: Every day | OPHTHALMIC | Status: DC

## 2022-01-25 MED ORDER — ONDANSETRON HCL 4 MG PO TABS: 4.0000 mg | ORAL_TABLET | Freq: Four times a day (QID) | ORAL | Status: DC | PRN

## 2022-01-25 MED ORDER — BRIMONIDINE TARTRATE 0.2 % OP SOLN
1.0000 [drp] | Freq: Two times a day (BID) | OPHTHALMIC | Status: DC
  Administered 2022-01-25 – 2022-01-30 (×10): 1 [drp] via OPHTHALMIC
  Filled 2022-01-25: qty 5

## 2022-01-25 MED ORDER — DORZOLAMIDE HCL-TIMOLOL MAL PF 2-0.5 % OP SOLN
1.0000 [drp] | Freq: Two times a day (BID) | OPHTHALMIC | Status: DC
Start: 2022-01-25 — End: 2022-01-25

## 2022-01-25 MED ORDER — OXYCODONE-ACETAMINOPHEN 5-325 MG PO TABS: 1.0000 | ORAL_TABLET | Freq: Four times a day (QID) | ORAL | Status: DC | PRN

## 2022-01-25 MED ORDER — SODIUM CHLORIDE 0.9 % IV SOLN
2.0000 g | Freq: Once | INTRAVENOUS | Status: AC
  Administered 2022-01-25: 2 g via INTRAVENOUS
  Filled 2022-01-25: qty 20

## 2022-01-25 MED ORDER — ATORVASTATIN CALCIUM 80 MG PO TABS
80.0000 mg | ORAL_TABLET | Freq: Every day | ORAL | Status: DC
  Administered 2022-01-26 – 2022-01-30 (×5): 80 mg via ORAL
  Filled 2022-01-25 (×5): qty 1

## 2022-01-25 MED ORDER — ONDANSETRON HCL 4 MG/2ML IJ SOLN: 4.0000 mg | Freq: Four times a day (QID) | INTRAMUSCULAR | Status: DC | PRN

## 2022-01-25 MED ORDER — LACTATED RINGERS IV BOLUS
1000.0000 mL | Freq: Once | INTRAVENOUS | Status: AC
  Administered 2022-01-25: 1000 mL via INTRAVENOUS

## 2022-01-25 MED ORDER — GABAPENTIN 300 MG PO CAPS: 300.0000 mg | ORAL_CAPSULE | Freq: Every day | ORAL | Status: DC

## 2022-01-25 MED ORDER — CLOPIDOGREL BISULFATE 75 MG PO TABS
75.0000 mg | ORAL_TABLET | Freq: Every day | ORAL | Status: DC
  Administered 2022-01-26 – 2022-01-30 (×5): 75 mg via ORAL
  Filled 2022-01-25 (×5): qty 1

## 2022-01-25 MED ORDER — ENSURE ENLIVE PO LIQD
237.0000 mL | Freq: Two times a day (BID) | ORAL | Status: DC
  Administered 2022-01-26 (×2): 237 mL via ORAL

## 2022-01-25 MED ORDER — SODIUM CHLORIDE 0.9 % IV SOLN: INTRAVENOUS | Status: AC

## 2022-01-25 MED ORDER — CYCLOSPORINE 0.05 % OP EMUL
1.0000 [drp] | Freq: Two times a day (BID) | OPHTHALMIC | Status: DC
  Administered 2022-01-25 – 2022-01-30 (×10): 1 [drp] via OPHTHALMIC
  Filled 2022-01-25 (×11): qty 30

## 2022-01-25 MED ORDER — DORZOLAMIDE HCL-TIMOLOL MAL PF 22.3-6.8 MG/ML OP SOLN
1.0000 [drp] | Freq: Two times a day (BID) | OPHTHALMIC | Status: DC
Start: 2022-01-25 — End: 2022-01-26
  Administered 2022-01-25: 1 [drp] via OPHTHALMIC
  Filled 2022-01-25 (×3): qty 0.1

## 2022-01-25 MED ORDER — HYPROMELLOSE (GONIOSCOPIC) 2.5 % OP SOLN
1.0000 [drp] | Freq: Three times a day (TID) | OPHTHALMIC | Status: DC | PRN
  Filled 2022-01-25: qty 15

## 2022-01-25 MED ORDER — ENOXAPARIN SODIUM 40 MG/0.4ML IJ SOSY
40.0000 mg | PREFILLED_SYRINGE | INTRAMUSCULAR | Status: DC
  Administered 2022-01-26 – 2022-01-30 (×5): 40 mg via SUBCUTANEOUS
  Filled 2022-01-25 (×5): qty 0.4

## 2022-01-25 MED ORDER — SODIUM CHLORIDE 0.9 % IV SOLN
1.0000 g | INTRAVENOUS | Status: DC
  Administered 2022-01-26 – 2022-01-29 (×4): 1 g via INTRAVENOUS
  Filled 2022-01-25 (×4): qty 10

## 2022-01-25 NOTE — ED Notes (Signed)
ED TO INPATIENT HANDOFF REPORT  ED Nurse Name and Phone #: Casey Burkitt [phone]  S Name/Age/Gender [name] 81 y.o. male Room/Bed: 022C/022C  Code Status   Code Status: Full Code  Home/SNF/Other Home Patient oriented to: self Is this baseline? No   Triage Complete: Triage complete  Chief Complaint Encephalopathy [G93.40]  Triage Note Pt BIB [facility] from home d/t increased weakness over the past few day. Family suspecting UTI. Foul odor and dark urine. VSS. A&O X4 w EMS.   Baseline - up by 8am, up moving around the house, but not today. Pt think she's more lethargic than normal. 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    Allergies No Known Allergies  Level of Care/Admitting Diagnosis ED Disposition     ED Disposition  Admit   Condition  --   Comment  Hospital Area: [facility] [[id number]]  Level of Care: Telemetry Medical [104]  May place patient in observation at [facility] or [facility] if equivalent level of care is available:: No  Covid Evaluation: Asymptomatic - no recent exposure (last 10 days) testing not required  Diagnosis: Encephalopathy [270623]  Admitting Physician: Lequita Halt [[id number]]  Attending Physician: Lequita Halt [[id number]]          B Medical/Surgery History Past Medical History:  Diagnosis Date   Arthritis    Denies   Glaucoma    High cholesterol    Hypertension    denies   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 great toe   Peripheral vascular disease ([place])    diabetic  with osteomylitis   Pre-diabetes    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   Past Surgical History:  Procedure Laterality Date   AMPUTATION  07/23/2011   Procedure: AMPUTATION DIGIT;  Surgeon: Newt Minion, MD;  Location: [place];  Service: Orthopedics;  Laterality: Right;  Right Great Toe Amputation   AMPUTATION Right 09/18/2012   Procedure: Right Foot 2nd Ray Amputation;  Surgeon: Newt Minion, MD;  Location: [place];  Service: Orthopedics;  Laterality: Right;  Right  Foot 2nd Ray Amputation   AMPUTATION Right 10/14/2012   Procedure: AMPUTATION FOOT;  Surgeon: Newt Minion, MD;  Location: [place];  Service: Orthopedics;  Laterality: Right;  Right Foot Transmetatarsal Amputation   AMPUTATION Left 09/23/2014   Procedure: Left Foot Transmetatarsal Amputation;  Surgeon: Newt Minion, MD;  Location: [facility];  Service: Orthopedics;  Laterality: Left;   AMPUTATION Left 11/22/2020   Procedure: LEFT BELOW KNEE AMPUTATION;  Surgeon: Newt Minion, MD;  Location: [place];  Service: Orthopedics;  Laterality: Left;   BACK SURGERY     lower   CARPAL TUNNEL RELEASE Right 01/30/2018   Procedure: RIGHT CARPAL TUNNEL RELEASE;  Surgeon: Newt Minion, MD;  Location: [place];  Service: Orthopedics;  Laterality: Right;   CIRCUMCISION  2010   COLONOSCOPY     FOOT FRACTURE SURGERY Left 1970's   "broke it playing football" (10/12/2012)   HUMERUS FRACTURE SURGERY W/ IMPLANT Right 1960's   "put a plate in it" (3/71/0626)   I & D EXTREMITY Right 03/24/2020   Procedure: EXCISION RIGHT MEDIAL CUNEFORM;  Surgeon: Newt Minion, MD;  Location: [place];  Service: Orthopedics;  Laterality: Right;   INCISION AND DRAINAGE OF WOUND Left 2006   "foot" (10/12/2012)   Bier SURGERY  2008     A IV Location/Drains/Wounds Patient Lines/Drains/Airways Status     Active Line/Drains/Airways     Name Placement  date Placement time Site Days   Peripheral IV 01/25/22 20 G 1.16" Left Antecubital 01/25/22  1322  Antecubital  less than 1   Negative Pressure Wound Therapy Knee Left 11/22/20  1024  --  429   Incision (Closed) 03/24/20 Foot Right 03/24/20  1129  -- 672   Incision (Closed) 11/22/20 Leg Left 11/22/20  1023  -- 429   Wound / Incision (Open or Dehisced) 03/20/20 Skin tear Arm Anterior;Lower;Right 0.1x0.2x0.1cm, no drainage 03/20/20  0920  Arm  676   Wound / Incision (Open or Dehisced) 03/20/20 Diabetic ulcer Heel Right 0.2x0.2x0.1cm open area, no drainage at this time 03/20/20  0920  Heel  676    Wound / Incision (Open or Dehisced) 03/20/20 Diabetic ulcer Foot Left;Other (Comment) 0.1x0.2x0.1cm open area 03/20/20  0920  Foot  676            Intake/Output Last 24 hours  Intake/Output Summary (Last 24 hours) at 01/25/2022 2033 Last data filed at 01/25/2022 1912 Gross per 24 hour  Intake 1000 ml  Output 150 ml  Net 850 ml    Labs/Imaging Results for orders placed or performed during the hospital encounter of 01/25/22 (from the past 48 hour(s))  Comprehensive metabolic panel     Status: Abnormal   Collection Time: 01/25/22 12:20 PM  Result Value Ref Range   Sodium 138 135 - 145 mmol/L   Potassium 3.8 3.5 - 5.1 mmol/L   Chloride 104 98 - 111 mmol/L   CO2 24 22 - 32 mmol/L   Glucose, Bld 101 (H) 70 - 99 mg/dL    Comment: Glucose reference range applies only to samples taken after fasting for at least 8 hours.   BUN 16 8 - 23 mg/dL   Creatinine, Ser 1.04 0.61 - 1.24 mg/dL   Calcium 9.1 8.9 - 10.3 mg/dL   Total Protein 8.0 6.5 - 8.1 g/dL   Albumin 3.4 (L) 3.5 - 5.0 g/dL   AST 19 15 - 41 U/L   ALT 10 0 - 44 U/L   Alkaline Phosphatase 149 (H) 38 - 126 U/L   Total Bilirubin 0.7 0.3 - 1.2 mg/dL   GFR, Estimated >60 >60 mL/min    Comment: (NOTE) Calculated using the CKD-EPI Creatinine Equation (2021)    Anion gap 10 5 - 15    Comment: Performed at [facility] [address] [place], [place] [address]  CBC with Differential     Status: Abnormal   Collection Time: 01/25/22 12:20 PM  Result Value Ref Range   WBC 9.2 4.0 - 10.5 K/uL   RBC 3.37 (L) 4.22 - 5.81 MIL/uL   Hemoglobin 9.1 (L) 13.0 - 17.0 g/dL   HCT 28.8 (L) 39.0 - 52.0 %   MCV 85.5 80.0 - 100.0 fL   MCH 27.0 26.0 - 34.0 pg   MCHC 31.6 30.0 - 36.0 g/dL   RDW 17.2 (H) 11.5 - 15.5 %   Platelets 298 150 - 400 K/uL   nRBC 0.0 0.0 - 0.2 %   Neutrophils Relative % 74 %   Neutro Abs 6.8 1.7 - 7.7 K/uL   Lymphocytes Relative 12 %   Lymphs Abs 1.1 0.7 - 4.0 K/uL   Monocytes Relative 11 %   Monocytes  Absolute 1.0 0.1 - 1.0 K/uL   Eosinophils Relative 2 %   Eosinophils Absolute 0.2 0.0 - 0.5 K/uL   Basophils Relative 0 %   Basophils Absolute 0.0 0.0 - 0.1 K/uL   Immature  Granulocytes 1 %   Abs Immature Granulocytes 0.10 (H) 0.00 - 0.07 K/uL    Comment: Performed at [facility], [place] [address], [place], [place] [address]  Troponin I (High Sensitivity)     Status: Abnormal   Collection Time: 01/25/22 12:20 PM  Result Value Ref Range   Troponin I (High Sensitivity) 139 (HH) <18 ng/L    Comment: CRITICAL RESULT CALLED TO, READ BACK BY AND VERIFIED WITH A MCCOURE RN 1443 01/25/2022 BY R VERAAR (NOTE) Elevated high sensitivity troponin I (hsTnI) values and significant  changes across serial measurements may suggest ACS but many other  chronic and acute conditions are known to elevate hsTnI results.  Refer to the "Links" section for chest pain algorithms and additional  guidance. Performed at [facility], [place] [address], [place], [place] [address]   Troponin I (High Sensitivity)     Status: Abnormal   Collection Time: 01/25/22  2:46 PM  Result Value Ref Range   Troponin I (High Sensitivity) 160 (HH) <18 ng/L    Comment: CRITICAL VALUE NOTED. VALUE IS CONSISTENT WITH PREVIOUSLY REPORTED/CALLED VALUE (NOTE) Elevated high sensitivity troponin I (hsTnI) values and significant  changes across serial measurements may suggest ACS but many other  chronic and acute conditions are known to elevate hsTnI results.  Refer to the "Links" section for chest pain algorithms and additional  guidance. Performed at [facility], [place] [address] [place], [place] [address]   Urinalysis, Routine w reflex microscopic     Status: Abnormal   Collection Time: 01/25/22  4:11 PM  Result Value Ref Range   Color, Urine YELLOW YELLOW   APPearance CLOUDY (A) CLEAR   Specific Gravity, Urine 1.015 1.005 - 1.030   pH 5.0 5.0 - 8.0   Glucose, UA NEGATIVE NEGATIVE mg/dL   Hgb urine dipstick  MODERATE (A) NEGATIVE   Bilirubin Urine NEGATIVE NEGATIVE   Ketones, ur NEGATIVE NEGATIVE mg/dL   Protein, ur NEGATIVE NEGATIVE mg/dL   Nitrite NEGATIVE NEGATIVE   Leukocytes,Ua LARGE (A) NEGATIVE   RBC / HPF 11-20 0 - 5 RBC/hpf   WBC, UA >50 (H) 0 - 5 WBC/hpf   Bacteria, UA RARE (A) NONE SEEN   Squamous Epithelial / LPF 0-5 0 - 5   Mucus PRESENT     Comment: Performed at [facility], [place] [address] [place], [place] [address]   CT Head Wo Contrast  Result Date: 01/25/2022 CLINICAL DATA:  Altered mental status EXAM: CT HEAD WITHOUT CONTRAST TECHNIQUE: Contiguous axial images were obtained from the base of the skull through the vertex without intravenous contrast. RADIATION DOSE REDUCTION: This exam was performed according to the departmental dose-optimization program which includes automated exposure control, adjustment of the mA and/or kV according to patient size and/or use of iterative reconstruction technique. COMPARISON:  10/31/2018 FINDINGS: Brain: No acute intracranial findings are seen. There are no signs of bleeding within the cranium. Ventricles are not dilated. There is low-attenuation in the posterior right temporal lobe and in right occipital lobe suggesting possible old infarct. Vascular: Scattered arterial calcifications are seen. Skull: No fracture is seen in calvarium. Hyperostosis frontalis interna is seen. Sinuses/Orbits: Unremarkable. Other: None. IMPRESSION: No acute intracranial findings are seen in noncontrast CT brain. Old infarct is seen in the posterior right temporal lobe and right occipital lobe. Cortical sulci are prominent suggesting atrophy. Electronically Signed   By: Elmer Picker M.D.   On: 01/25/2022 16:53   DG Chest  Port 1 View  Result Date: 01/25/2022 CLINICAL DATA:  shob EXAM: PORTABLE CHEST 1 VIEW COMPARISON:  March 19, 2020 FINDINGS: The heart size and mediastinal contours are mildly prominent, stable. Both lungs are clear and stable. Moderate  thoracic spondylosis. IMPRESSION: No active disease. Electronically Signed   By: Frazier Richards M.D.   On: 01/25/2022 15:29    Pending Labs Unresulted Labs (From admission, onward)     Start     Ordered   01/25/22 1633  CK  Add-on,   AD        01/25/22 1633            Vitals/Pain Today's Vitals   01/25/22 1520 01/25/22 1525 01/25/22 1607 01/25/22 1846  BP:  110/71    Pulse:  79    Resp:  15    Temp:  98.2 F (36.8 C)  99.4 F (37.4 C)  TempSrc:  Oral    SpO2:  100%    PainSc: 0-No pain  0-No pain     Isolation Precautions No active isolations  Medications Medications  cefTRIAXone (ROCEPHIN) 1 g in sodium chloride 0.9 % 100 mL IVPB (has no administration in time range)  0.9 %  sodium chloride infusion ( Intravenous New Bag/Given 01/25/22 1840)  feeding supplement (ENSURE ENLIVE / ENSURE PLUS) liquid 237 mL (has no administration in time range)  oxyCODONE-acetaminophen (PERCOCET/ROXICET) 5-325 MG per tablet 1 tablet (has no administration in time range)  atorvastatin (LIPITOR) tablet 80 mg (has no administration in time range)  clopidogrel (PLAVIX) tablet 75 mg (has no administration in time range)  gabapentin (NEURONTIN) capsule 300 mg (has no administration in time range)  brimonidine (ALPHAGAN) 0.2 % ophthalmic solution 1 drop (has no administration in time range)  cycloSPORINE (RESTASIS) 0.05 % ophthalmic emulsion 1 drop (has no administration in time range)  Dorzolamide HCl-Timolol Mal PF 2-0.5 % SOLN 1 drop (has no administration in time range)  dorzolamidel-timolol (COSOPT) 22.3-6.8 MG/ML ophthalmic solution SOLN 1 drop (has no administration in time range)  hydroxypropyl methylcellulose / hypromellose (ISOPTO TEARS / GONIOVISC) 2.5 % ophthalmic solution 1 drop (has no administration in time range)  ketorolac (ACULAR) 0.5 % ophthalmic solution 1 drop (has no administration in time range)  enoxaparin (LOVENOX) injection 40 mg (has no administration in time range)   ondansetron (ZOFRAN) tablet 4 mg (has no administration in time range)    Or  ondansetron (ZOFRAN) injection 4 mg (has no administration in time range)  lactated ringers bolus 1,000 mL (0 mLs Intravenous Stopped 01/25/22 1645)  cefTRIAXone (ROCEPHIN) 2 g in sodium chloride 0.9 % 100 mL IVPB (0 g Intravenous Stopped 01/25/22 1754)    Mobility non-ambulatory Moderate fall risk   Focused Assessments Neuro Assessment Handoff:         Neuro Assessment: Exceptions to WDL Neuro Checks:      Last Documented NIHSS Modified Score:   Has TPA been given? No If patient is a Neuro Trauma and patient is going to OR before floor call report to [facility] nurse: [phone] or [phone]   R Recommendations: See Admitting Provider Note  Report given to:   Additional Notes:

## 2022-01-25 NOTE — ED Provider Notes (Signed)
[facility] EMERGENCY DEPARTMENT Provider Note   CSN: [id number] Arrival date & time: 01/25/22  1135     History  Chief Complaint  Patient presents with   Weakness    [name] is a 81 y.o. male.  HPI     81 year old male comes to the emergency room with chief complaint of generalized weakness. Patient resides by himself.  Niece at the bedside and provides meaningful history.  Patient has history of diabetes, coronary artery disease, hypertension and hyperlipidemia.  He lives at his home independently.  Family checks on him regularly.  Patient has left-sided below the knee amputation, but no bedsores.  According to family, patient was generally well yesterday evening.  This morning however, he appeared slightly more confused and weak therefore they decided to bring him to the ER.  Patient has had similar episodes in the past and dehydration and UTI.  Patient is doing a little better than he was at home.  Patient denies any severe headache, neck pain, cough, congestion.  Home Medications Prior to Admission medications   Medication Sig Start Date End Date Taking? Authorizing Provider  acetaminophen-codeine (TYLENOL #3) 300-30 MG tablet Take 1 tablet by mouth every 6 (six) hours as needed for moderate pain. 10/19/21   Suzan Slick, NP  atorvastatin (LIPITOR) 80 MG tablet Take 80 mg by mouth daily.    [provider]  brimonidine (ALPHAGAN) 0.2 % ophthalmic solution Place 1 drop into the right eye 2 (two) times daily.    [provider]  clopidogrel (PLAVIX) 75 MG tablet Take 1 tablet (75 mg total) by mouth daily. 11/01/18   Mariel Aloe, MD  cycloSPORINE (RESTASIS) 0.05 % ophthalmic emulsion Place 1 drop into both eyes 2 (two) times daily.    [provider]  Dorzolamide HCl-Timolol Mal PF 2-0.5 % SOLN Place 1 drop into both eyes 2 (two) times daily. 03/28/20   Aline August, MD  dorzolamidel-timolol (COSOPT) 22.3-6.8 MG/ML SOLN  ophthalmic solution Place 1 drop into the right eye 2 (two) times daily.    [provider]  gabapentin (NEURONTIN) 300 MG capsule Take 1 capsule (300 mg total) by mouth at bedtime. 10/22/21   Newt Minion, MD  hydroxypropyl methylcellulose / hypromellose (ISOPTO TEARS / GONIOVISC) 2.5 % ophthalmic solution Place 1 drop into both eyes 3 (three) times daily as needed for dry eyes. 09/01/20   Valarie Merino, MD  ketorolac (ACULAR) 0.5 % ophthalmic solution Place 1 drop into both eyes daily.    [provider]  oxyCODONE-acetaminophen (PERCOCET/ROXICET) 5-325 MG tablet Take 1 tablet by mouth every 6 (six) hours as needed for severe pain. 03/13/21   Sponseller, Gypsy Balsam, PA-C      Allergies    Patient has no known allergies.    Review of Systems   Review of Systems  All other systems reviewed and are negative.   Physical Exam Updated Vital Signs BP 110/71   Pulse 79   Temp 98.2 F (36.8 C) (Oral)   Resp 15   SpO2 100%  Physical Exam Vitals and nursing note reviewed.  Constitutional:      Appearance: He is well-developed.  HENT:     Head: Atraumatic.  Cardiovascular:     Rate and Rhythm: Normal rate.  Pulmonary:     Effort: Pulmonary effort is normal.  Musculoskeletal:     Cervical back: Neck supple.  Skin:    General: Skin is warm.  Neurological:  Mental Status: He is alert and oriented to person, place, and time.     ED Results / Procedures / Treatments   Labs (all labs ordered are listed, but only abnormal results are displayed) Labs Reviewed  COMPREHENSIVE METABOLIC PANEL - Abnormal; Notable for the following components:      Result Value   Glucose, Bld 101 (*)    Albumin 3.4 (*)    Alkaline Phosphatase 149 (*)    All other components within normal limits  CBC WITH DIFFERENTIAL/PLATELET - Abnormal; Notable for the following components:   RBC 3.37 (*)    Hemoglobin 9.1 (*)    HCT 28.8 (*)    RDW 17.2 (*)    Abs Immature Granulocytes 0.10  (*)    All other components within normal limits  TROPONIN I (HIGH SENSITIVITY) - Abnormal; Notable for the following components:   Troponin I (High Sensitivity) 139 (*)    All other components within normal limits  TROPONIN I (HIGH SENSITIVITY) - Abnormal; Notable for the following components:   Troponin I (High Sensitivity) 160 (*)    All other components within normal limits  URINALYSIS, ROUTINE W REFLEX MICROSCOPIC    EKG EKG Interpretation  Date/Time:  Friday January 25 2022 15:13:16 EDT Ventricular Rate:  91 PR Interval:  210 QRS Duration: 138 QT Interval:  413 QTC Calculation: 509 R Axis:   -73 Text Interpretation: Sinus rhythm Borderline prolonged PR interval 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 Both lungs are clear and stable. Moderate thoracic spondylosis. IMPRESSION: No active disease. Electronically Signed   By: Frazier Richards M.D.   On: 01/25/2022 15:29    Procedures .Critical Care  Performed by: Varney Biles, MD Authorized by: Varney Biles, MD   Critical care provider statement:    Critical care time (minutes):  36   Critical care was necessary to treat or prevent imminent or life-threatening deterioration of the following conditions:  Cardiac failure and CNS failure or compromise   Critical care was time spent personally by me on the following activities:  Development of treatment plan with patient or surrogate, discussions with consultants, evaluation of patient's response to treatment, examination of patient, ordering and review of laboratory studies, ordering and review of radiographic studies, ordering and performing treatments and interventions, pulse oximetry, re-evaluation  of patient's condition and review of old charts     Medications Ordered in ED Medications  lactated ringers bolus 1,000 mL (1,000 mLs Intravenous New Bag/Given 01/25/22 1528)    ED Course/ Medical Decision Making/ A&P                           Medical Decision Making Amount and/or Complexity of Data Reviewed Labs: ordered. Radiology: ordered.    This patient presents to the ED with chief complaint(s) of increased confusion and weakness with pertinent past medical history of hypertension, hyperlipidemia, stroke, peripheral vascular disease status post amputation of left leg which further complicates the presenting complaint. The complaint involves an extensive differential diagnosis and also carries with it a high risk of complications and morbidity.    Patient resides at home by himself.  Family indicates that he is weaker than usual and was more confused.  The differential diagnosis includes severe dehydration, electrolyte abnormality, new renal failure, UTI, medication side effects, silent MI  The initial plan is to get basic blood work and reassess the patient.   Additional history obtained: Additional history obtained from family Records reviewed previous admission documents and discharge from 2021, he had elevated troponin at that time.  Patient had osteomyelitis, elevated troponin and required  Independent labs interpretation:  The following labs were independently interpreted: Patient's troponin is elevated moderately.  Independent visualization of imaging: - I independently visualized the following imaging with scope of interpretation limited to determining acute life threatening conditions related to emergency care: X-ray of the chest, which revealed no evidence of pneumonia  Treatment and Reassessment: Patient reassessed. Troponin is mild.  Repeat is higher. Questioning if this is a demand ischemia versus NSTEMI.  Urine test is pending.  Patient getting hydrated.   With rising troponin, altered mental status, will get CT scan of the brain and admit the patient.  Final Clinical Impression(s) / ED Diagnoses Final diagnoses:  Elevated troponin  Acute encephalopathy    Rx / DC Orders ED Discharge Orders     None         Varney Biles, MD 01/25/22 1619

## 2022-01-25 NOTE — H&P (Signed)
History and Physical    [name] ZOX:[id number] DOB: 04/10/1941 DOA: 01/25/2022  PCP: [name], MD (Confirm with patient/family/NH records and if not entered, this has to be entered at [facility] point of entry) Patient coming from: Home  I have personally briefly reviewed patient's old medical records in [place]  Chief Complaint: Abd pain  HPI: [name] is a 81 y.o. 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  Patient has baseline dementia, lives by himself with family members dropped by regularly.  Last few days, family found the patient became less active episodes of confusion and occasional visual hallucinations.  Niece also reported patient appears to have significant decrease of oral intake including fluid.  Patient complained about suprapubic abdominal pain, and burning sensations of urination but denies any fever chills no diarrhea no cough.  Patient has metastatic prostate cancer follows with oncology and has been on chemotherapy, per report a 26 pound weight loss this year.  ED Course: Blood pressure borderline low, no tachycardia afebrile.  UA with compatible with UTI.  Cefadroxil started in ED, troponin 139> 160.  Review of Systems: Unable to perform, patient confused.   Past Medical History:  Diagnosis Date   Arthritis    Denies   Glaucoma    High cholesterol    Hypertension    denies   Osteomyelitis ([place])    Right great toe   Peripheral vascular disease ([place])    diabetic  with osteomylitis   Pre-diabetes    Stroke (cerebrum) ([place]) 10/2018    Past Surgical History:  Procedure Laterality Date   AMPUTATION  07/23/2011   Procedure: AMPUTATION DIGIT;  Surgeon: Newt Minion, MD;  Location: [place];  Service: Orthopedics;  Laterality: Right;  Right Great Toe Amputation   AMPUTATION Right 09/18/2012    Procedure: Right Foot 2nd Ray Amputation;  Surgeon: Newt Minion, MD;  Location: [place];  Service: Orthopedics;  Laterality: Right;  Right Foot 2nd Ray Amputation   AMPUTATION Right 10/14/2012   Procedure: AMPUTATION FOOT;  Surgeon: Newt Minion, MD;  Location: [place];  Service: Orthopedics;  Laterality: Right;  Right Foot Transmetatarsal Amputation   AMPUTATION Left 09/23/2014   Procedure: Left Foot Transmetatarsal Amputation;  Surgeon: Newt Minion, MD;  Location: [place];  Service: Orthopedics;  Laterality: Left;   AMPUTATION Left 11/22/2020   Procedure: LEFT BELOW KNEE AMPUTATION;  Surgeon: Newt Minion, MD;  Location: [place];  Service: Orthopedics;  Laterality: Left;   BACK SURGERY     lower   CARPAL TUNNEL RELEASE Right 01/30/2018   Procedure: RIGHT CARPAL TUNNEL RELEASE;  Surgeon: Newt Minion, MD;  Location: [place];  Service: Orthopedics;  Laterality: Right;   CIRCUMCISION  2010   COLONOSCOPY     FOOT FRACTURE SURGERY Left 1970's   "broke it playing football" (10/12/2012)   HUMERUS FRACTURE SURGERY W/ IMPLANT Right 1960's   "put a plate in it" (01/25/9146)   I & D EXTREMITY Right 03/24/2020   Procedure: EXCISION RIGHT MEDIAL CUNEFORM;  Surgeon: Newt Minion, MD;  Location: [place];  Service: Orthopedics;  Laterality: Right;   INCISION AND DRAINAGE OF WOUND Left 2006   "foot" (10/12/2012)   [place] SURGERY  2008     reports that he has been smoking pipe. He has been exposed to tobacco smoke. He has never used smokeless tobacco.  He reports that he does not drink alcohol and does not use drugs.  No Known Allergies  Family History  Problem Relation Age of Onset   Diabetes Mellitus II Other    Anesthesia problems Neg Hx     Prior to Admission medications   Medication Sig Start Date End Date Taking? Authorizing Provider  acetaminophen-codeine (TYLENOL #3) 300-30 MG tablet Take 1 tablet by mouth every 6 (six) hours as needed for moderate pain. 10/19/21   Suzan Slick, NP   atorvastatin (LIPITOR) 80 MG tablet Take 80 mg by mouth daily.    [provider]  brimonidine (ALPHAGAN) 0.2 % ophthalmic solution Place 1 drop into the right eye 2 (two) times daily.    [provider]  clopidogrel (PLAVIX) 75 MG tablet Take 1 tablet (75 mg total) by mouth daily. 11/01/18   Mariel Aloe, MD  cycloSPORINE (RESTASIS) 0.05 % ophthalmic emulsion Place 1 drop into both eyes 2 (two) times daily.    [provider]  Dorzolamide HCl-Timolol Mal PF 2-0.5 % SOLN Place 1 drop into both eyes 2 (two) times daily. 03/28/20   Aline August, MD  dorzolamidel-timolol (COSOPT) 22.3-6.8 MG/ML SOLN ophthalmic solution Place 1 drop into the right eye 2 (two) times daily.    [provider]  gabapentin (NEURONTIN) 300 MG capsule Take 1 capsule (300 mg total) by mouth at bedtime. 10/22/21   Newt Minion, MD  hydroxypropyl methylcellulose / hypromellose (ISOPTO TEARS / GONIOVISC) 2.5 % ophthalmic solution Place 1 drop into both eyes 3 (three) times daily as needed for dry eyes. 09/01/20   Valarie Merino, MD  ketorolac (ACULAR) 0.5 % ophthalmic solution Place 1 drop into both eyes daily.    [provider]  oxyCODONE-acetaminophen (PERCOCET/ROXICET) 5-325 MG tablet Take 1 tablet by mouth every 6 (six) hours as needed for severe pain. 03/13/21   Sponseller, Gypsy Balsam, PA-C    Physical Exam: Vitals:   01/25/22 1300 01/25/22 1345 01/25/22 1400 01/25/22 1525  BP: (!) 103/51 102/64 (!) 104/57 110/71  Pulse: 64  72 79  Resp: (!) '22  10 15  '$ Temp:    98.2 F (36.8 C)  TempSrc:    Oral  SpO2: 100%  98% 100%    Constitutional: NAD, calm, comfortable Vitals:   01/25/22 1300 01/25/22 1345 01/25/22 1400 01/25/22 1525  BP: (!) 103/51 102/64 (!) 104/57 110/71  Pulse: 64  72 79  Resp: (!) '22  10 15  '$ Temp:    98.2 F (36.8 C)  TempSrc:    Oral  SpO2: 100%  98% 100%   Eyes: PERRL, lids and conjunctivae normal ENMT: Mucous membranes are dry. Posterior  pharynx clear of any exudate or lesions.Normal dentition.  Neck: normal, supple, no masses, no thyromegaly Respiratory: clear to auscultation bilaterally, no wheezing, no crackles. Normal respiratory effort. No accessory muscle use.  Cardiovascular: Regular rate and rhythm, no murmurs / rubs / gallops. No extremity edema. 2+ pedal pulses. No carotid bruits.  Abdomen: Mild tenderness on suprapubic area, no rebound no guarding, no masses palpated. No hepatosplenomegaly. Bowel sounds positive.  Musculoskeletal: no clubbing / cyanosis. No joint deformity upper and lower extremities. Good ROM, no contractures. Normal muscle tone.  Skin: no rashes, lesions, ulcers. No induration Neurologic: No facial droops, moving all limbs, following simple commands. Psychiatric: Awake, confused  Labs on Admission: I have personally reviewed following labs and imaging studies  CBC: Recent Labs  Lab 01/25/22 1220  WBC 9.2  NEUTROABS  6.8  HGB 9.1*  HCT 28.8*  MCV 85.5  PLT 379   Basic Metabolic Panel: Recent Labs  Lab 01/25/22 1220  NA 138  K 3.8  CL 104  CO2 24  GLUCOSE 101*  BUN 16  CREATININE 1.04  CALCIUM 9.1   GFR: CrCl cannot be calculated (Unknown ideal weight.). Liver Function Tests: Recent Labs  Lab 01/25/22 1220  AST 19  ALT 10  ALKPHOS 149*  BILITOT 0.7  PROT 8.0  ALBUMIN 3.4*   No results for input(s): "LIPASE", "AMYLASE" in the last 168 hours. No results for input(s): "AMMONIA" in the last 168 hours. Coagulation Profile: No results for input(s): "INR", "PROTIME" in the last 168 hours. Cardiac Enzymes: No results for input(s): "CKTOTAL", "CKMB", "CKMBINDEX", "TROPONINI" in the last 168 hours. BNP (last 3 results) No results for input(s): "PROBNP" in the last 8760 hours. HbA1C: No results for input(s): "HGBA1C" in the last 72 hours. CBG: No results for input(s): "GLUCAP" in the last 168 hours. Lipid Profile: No results for input(s): "CHOL", "HDL", "LDLCALC", "TRIG",  "CHOLHDL", "LDLDIRECT" in the last 72 hours. Thyroid Function Tests: No results for input(s): "TSH", "T4TOTAL", "FREET4", "T3FREE", "THYROIDAB" in the last 72 hours. Anemia Panel: No results for input(s): "VITAMINB12", "FOLATE", "FERRITIN", "TIBC", "IRON", "RETICCTPCT" in the last 72 hours. Urine analysis:    Component Value Date/Time   COLORURINE YELLOW 01/25/2022 1611   APPEARANCEUR CLOUDY (A) 01/25/2022 1611   LABSPEC 1.015 01/25/2022 1611   PHURINE 5.0 01/25/2022 1611   GLUCOSEU NEGATIVE 01/25/2022 1611   HGBUR MODERATE (A) 01/25/2022 1611   HGBUR trace-intact 09/16/2007 1115   BILIRUBINUR NEGATIVE 01/25/2022 1611   KETONESUR NEGATIVE 01/25/2022 1611   PROTEINUR NEGATIVE 01/25/2022 1611   UROBILINOGEN 1.0 10/24/2013 1220   NITRITE NEGATIVE 01/25/2022 1611   LEUKOCYTESUR LARGE (A) 01/25/2022 1611    Radiological Exams on Admission: CT Head Wo Contrast  Result Date: 01/25/2022 CLINICAL DATA:  Altered mental status EXAM: CT HEAD WITHOUT CONTRAST TECHNIQUE: Contiguous axial images were obtained from the base of the skull through the vertex without intravenous contrast. RADIATION DOSE REDUCTION: This exam was performed according to the departmental dose-optimization program which includes automated exposure control, adjustment of the mA and/or kV according to patient size and/or use of iterative reconstruction technique. COMPARISON:  10/31/2018 FINDINGS: Brain: No acute intracranial findings are seen. There are no signs of bleeding within the cranium. Ventricles are not dilated. There is low-attenuation in the posterior right temporal lobe and in right occipital lobe suggesting possible old infarct. Vascular: Scattered arterial calcifications are seen. Skull: No fracture is seen in calvarium. Hyperostosis frontalis interna is seen. Sinuses/Orbits: Unremarkable. Other: None. IMPRESSION: No acute intracranial findings are seen in noncontrast CT brain. Old infarct is seen in the posterior right  temporal lobe and right occipital lobe. Cortical sulci are prominent suggesting atrophy. Electronically Signed   By: Elmer Picker M.D.   On: 01/25/2022 16:53   DG Chest Port 1 View  Result Date: 01/25/2022 CLINICAL DATA:  shob EXAM: PORTABLE CHEST 1 VIEW COMPARISON:  March 19, 2020 FINDINGS: The heart size and mediastinal contours are mildly prominent, stable. Both lungs are clear and stable. Moderate thoracic spondylosis. IMPRESSION: No active disease. Electronically Signed   By: Frazier Richards M.D.   On: 01/25/2022 15:29    EKG: Independently reviewed.  Sinus, chronic ST changes V3-V4  Assessment/Plan Principal Problem:   Encephalopathy Active Problems:   Acute encephalopathy  (please populate well all problems here in Problem List. (For  example, if patient is on BP meds at home and you resume or decide to hold them, it is a problem that needs to be her. Same for CAD, COPD, HLD and so on)  Acute metabolic encephalopathy -Secondary to UTI, continue ceftriaxone, urine culture sent. -Check PVR and consider starting Flomax  Elevated troponins -No chest pains, no significant ST changes on EKG, suspect demand ischemia from persistent hypotension. -Trend troponins tomorrow morning, echocardiogram.  Continue Plavix and atorvastatin. -Check CK level  Unintentional weight loss, moderate protein calorie malnutrition -Probably related to chemotherapy, continue Ensure, consult dietitian.  PVD, HTN, IIDM -Stable -Remove calorie restriction due to poor nutrition status.  Chronic normocytic anemia -H&H stable no symptoms or signs of bleeding, outpatient iron study and PCP/GI follow-up.  Deconditioning -PT evaluation tomorrow   DVT prophylaxis: Lovenox Code Status: Full code Family Communication: Niece over the phone Disposition Plan: Expect less than 2 midnight hospital stay Consults called: None Admission status: Tele obs   Lequita Halt MD [facility] Pager  [phone]  01/25/2022, 6:06 PM

## 2022-01-25 NOTE — ED Triage Notes (Signed)
Pt BIB [facility] from home d/t increased weakness over the past few day. Family suspecting UTI. Foul odor and dark urine. VSS. A&O X4 w EMS.   Baseline - up by 8am, up moving around the house, but not today. Pt think she's more lethargic than normal. 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

## 2022-01-25 NOTE — ED Notes (Signed)
Upon assessment of pt, pt was found to be alert to self. This RN made MD Nanavati aware of assessment findings.

## 2022-01-25 NOTE — ED Notes (Signed)
Lab called to add on urinalysis.

## 2022-01-26 ENCOUNTER — Observation Stay (HOSPITAL_COMMUNITY): Payer: Medicare Other

## 2022-01-26 DIAGNOSIS — F03A2 Unspecified dementia, mild, with psychotic disturbance: Secondary | ICD-10-CM | POA: Diagnosis present

## 2022-01-26 DIAGNOSIS — X58XXXA Exposure to other specified factors, initial encounter: Secondary | ICD-10-CM | POA: Diagnosis present

## 2022-01-26 DIAGNOSIS — Z89512 Acquired absence of left leg below knee: Secondary | ICD-10-CM | POA: Diagnosis not present

## 2022-01-26 DIAGNOSIS — Z8546 Personal history of malignant neoplasm of prostate: Secondary | ICD-10-CM | POA: Diagnosis not present

## 2022-01-26 DIAGNOSIS — R5381 Other malaise: Secondary | ICD-10-CM

## 2022-01-26 DIAGNOSIS — I43 Cardiomyopathy in diseases classified elsewhere: Secondary | ICD-10-CM | POA: Diagnosis not present

## 2022-01-26 DIAGNOSIS — I5041 Acute combined systolic (congestive) and diastolic (congestive) heart failure: Secondary | ICD-10-CM | POA: Diagnosis not present

## 2022-01-26 DIAGNOSIS — I739 Peripheral vascular disease, unspecified: Secondary | ICD-10-CM

## 2022-01-26 DIAGNOSIS — G9340 Encephalopathy, unspecified: Secondary | ICD-10-CM | POA: Diagnosis not present

## 2022-01-26 DIAGNOSIS — C7951 Secondary malignant neoplasm of bone: Secondary | ICD-10-CM | POA: Diagnosis not present

## 2022-01-26 DIAGNOSIS — F17290 Nicotine dependence, other tobacco product, uncomplicated: Secondary | ICD-10-CM | POA: Diagnosis present

## 2022-01-26 DIAGNOSIS — Z7189 Other specified counseling: Secondary | ICD-10-CM | POA: Diagnosis not present

## 2022-01-26 DIAGNOSIS — Z89511 Acquired absence of right leg below knee: Secondary | ICD-10-CM | POA: Diagnosis not present

## 2022-01-26 DIAGNOSIS — D630 Anemia in neoplastic disease: Secondary | ICD-10-CM | POA: Diagnosis present

## 2022-01-26 DIAGNOSIS — E1151 Type 2 diabetes mellitus with diabetic peripheral angiopathy without gangrene: Secondary | ICD-10-CM | POA: Diagnosis present

## 2022-01-26 DIAGNOSIS — E440 Moderate protein-calorie malnutrition: Secondary | ICD-10-CM | POA: Diagnosis not present

## 2022-01-26 DIAGNOSIS — C61 Malignant neoplasm of prostate: Secondary | ICD-10-CM | POA: Diagnosis not present

## 2022-01-26 DIAGNOSIS — I9589 Other hypotension: Secondary | ICD-10-CM | POA: Diagnosis not present

## 2022-01-26 DIAGNOSIS — Z833 Family history of diabetes mellitus: Secondary | ICD-10-CM | POA: Diagnosis not present

## 2022-01-26 DIAGNOSIS — R778 Other specified abnormalities of plasma proteins: Secondary | ICD-10-CM

## 2022-01-26 DIAGNOSIS — Z515 Encounter for palliative care: Secondary | ICD-10-CM | POA: Diagnosis not present

## 2022-01-26 DIAGNOSIS — E7800 Pure hypercholesterolemia, unspecified: Secondary | ICD-10-CM | POA: Diagnosis present

## 2022-01-26 DIAGNOSIS — E854 Organ-limited amyloidosis: Secondary | ICD-10-CM | POA: Diagnosis not present

## 2022-01-26 DIAGNOSIS — I5020 Unspecified systolic (congestive) heart failure: Secondary | ICD-10-CM | POA: Diagnosis not present

## 2022-01-26 DIAGNOSIS — G9341 Metabolic encephalopathy: Secondary | ICD-10-CM | POA: Diagnosis present

## 2022-01-26 DIAGNOSIS — Z7902 Long term (current) use of antithrombotics/antiplatelets: Secondary | ICD-10-CM | POA: Diagnosis not present

## 2022-01-26 DIAGNOSIS — Z8673 Personal history of transient ischemic attack (TIA), and cerebral infarction without residual deficits: Secondary | ICD-10-CM | POA: Diagnosis not present

## 2022-01-26 DIAGNOSIS — I110 Hypertensive heart disease with heart failure: Secondary | ICD-10-CM | POA: Diagnosis present

## 2022-01-26 DIAGNOSIS — Z79899 Other long term (current) drug therapy: Secondary | ICD-10-CM | POA: Diagnosis not present

## 2022-01-26 DIAGNOSIS — N390 Urinary tract infection, site not specified: Secondary | ICD-10-CM | POA: Diagnosis present

## 2022-01-26 DIAGNOSIS — I452 Bifascicular block: Secondary | ICD-10-CM | POA: Diagnosis present

## 2022-01-26 DIAGNOSIS — N3000 Acute cystitis without hematuria: Secondary | ICD-10-CM | POA: Diagnosis not present

## 2022-01-26 DIAGNOSIS — Z66 Do not resuscitate: Secondary | ICD-10-CM | POA: Diagnosis not present

## 2022-01-26 DIAGNOSIS — D649 Anemia, unspecified: Secondary | ICD-10-CM

## 2022-01-26 DIAGNOSIS — T451X5A Adverse effect of antineoplastic and immunosuppressive drugs, initial encounter: Secondary | ICD-10-CM | POA: Diagnosis present

## 2022-01-26 LAB — CBC
HCT: 22.7 % — ABNORMAL LOW (ref 39.0–52.0)
Hemoglobin: 7.4 g/dL — ABNORMAL LOW (ref 13.0–17.0)
MCH: 27.3 pg (ref 26.0–34.0)
MCHC: 32.6 g/dL (ref 30.0–36.0)
MCV: 83.8 fL (ref 80.0–100.0)
Platelets: 240 10*3/uL (ref 150–400)
RBC: 2.71 MIL/uL — ABNORMAL LOW (ref 4.22–5.81)
RDW: 17.3 % — ABNORMAL HIGH (ref 11.5–15.5)
WBC: 8.4 10*3/uL (ref 4.0–10.5)
nRBC: 0 % (ref 0.0–0.2)

## 2022-01-26 LAB — COMPREHENSIVE METABOLIC PANEL
ALT: 8 U/L (ref 0–44)
AST: 16 U/L (ref 15–41)
Albumin: 2.7 g/dL — ABNORMAL LOW (ref 3.5–5.0)
Alkaline Phosphatase: 101 U/L (ref 38–126)
Anion gap: 7 (ref 5–15)
BUN: 15 mg/dL (ref 8–23)
CO2: 24 mmol/L (ref 22–32)
Calcium: 8.4 mg/dL — ABNORMAL LOW (ref 8.9–10.3)
Chloride: 107 mmol/L (ref 98–111)
Creatinine, Ser: 1.01 mg/dL (ref 0.61–1.24)
GFR, Estimated: >60 mL/min (ref >60)
Glucose, Bld: 86 mg/dL (ref 70–99)
Potassium: 3.6 mmol/L (ref 3.5–5.1)
Sodium: 138 mmol/L (ref 135–145)
Total Bilirubin: 0.5 mg/dL (ref 0.3–1.2)
Total Protein: 6.7 g/dL (ref 6.5–8.1)

## 2022-01-26 LAB — DIFFERENTIAL
Abs Immature Granulocytes: 0.06 10*3/uL (ref 0.00–0.07)
Basophils Absolute: 0 10*3/uL (ref 0.0–0.1)
Basophils Relative: 0 %
Eosinophils Absolute: 0.1 10*3/uL (ref 0.0–0.5)
Eosinophils Relative: 1 %
Immature Granulocytes: 1 %
Lymphocytes Relative: 18 %
Lymphs Abs: 1.5 10*3/uL (ref 0.7–4.0)
Monocytes Absolute: 1 10*3/uL (ref 0.1–1.0)
Monocytes Relative: 12 %
Neutro Abs: 5.7 10*3/uL (ref 1.7–7.7)
Neutrophils Relative %: 68 %

## 2022-01-26 LAB — TROPONIN I (HIGH SENSITIVITY): Troponin I (High Sensitivity): 207 ng/L (ref <18)

## 2022-01-26 LAB — ECHOCARDIOGRAM COMPLETE
Area-P 1/2: 4.39 cm2
S' Lateral: 3.6 cm

## 2022-01-26 MED ORDER — ENSURE ENLIVE PO LIQD
237.0000 mL | Freq: Three times a day (TID) | ORAL | Status: DC
  Administered 2022-01-26 – 2022-01-28 (×4): 237 mL via ORAL

## 2022-01-26 MED ORDER — DORZOLAMIDE HCL-TIMOLOL MAL 2-0.5 % OP SOLN
1.0000 [drp] | Freq: Two times a day (BID) | OPHTHALMIC | Status: DC
  Administered 2022-01-26 – 2022-01-30 (×9): 1 [drp] via OPHTHALMIC
  Filled 2022-01-26: qty 0.1

## 2022-01-26 NOTE — Progress Notes (Signed)
  Echocardiogram 2D Echocardiogram has been performed.  [name] F 01/26/2022, 10:33 AM

## 2022-01-26 NOTE — Evaluation (Signed)
Physical Therapy Evaluation Patient Details Name: [name] MRN: [id number] DOB: 19-Jul-1940 Today's Date: 01/26/2022  History of Present Illness  Pt is 81 yo male who presented on 01/25/22 with altered mentation and decreased oral intake. Suspected UTI.  PMH: metastatic prostate cancer, arthritis, glaucoma, HTN, osteomyelitis, PVD, DM2, CVA cerebellum, NSTEMI, gout, R transmetatarsal amputation, L BKA, dementia.  Clinical Impression  Pt admitted with above diagnosis. Pt is from home where his nieces were helping him with self care. He had been independent with transfers to and from his w/c but his niece reported that recently he had been needing more assistance. He very much wants to return to his own home. Today he needed mod A to scoot into drop arm recliner. He performed partial stand with max A for bowel clean up but cannot maintain this position. Will follow him acutely and recommend HHPT at d/c. A HHaide would also benefit this family.  Pt currently with functional limitations due to the deficits listed below (see PT Problem List). Pt will benefit from skilled PT to increase their independence and safety with mobility to allow discharge to the venue listed below.          Recommendations for follow up therapy are one component of a multi-disciplinary discharge planning process, led by the attending physician.  Recommendations may be updated based on patient status, additional functional criteria and insurance authorization.  Follow Up Recommendations Home health PT      Assistance Recommended at Discharge Frequent or constant Supervision/Assistance  Patient can return home with the following  A lot of help with bathing/dressing/bathroom;A little help with walking and/or transfers;Assistance with cooking/housework;Direct supervision/assist for medications management;Direct supervision/assist for financial management;Assist for transportation    Equipment Recommendations None recommended  by PT  Recommendations for Other Services  OT consult    Functional Status Assessment Patient has had a recent decline in their functional status and demonstrates the ability to make significant improvements in function in a reasonable and predictable amount of time.     Precautions / Restrictions Precautions Precautions: Fall Precaution Comments: has special shoe for R foot (not here at hospital), does not regularly don L prosthesis Restrictions Weight Bearing Restrictions: No Other Position/Activity Restrictions: blindness      Mobility  Bed Mobility Overal bed mobility: Needs Assistance Bed Mobility: Supine to Sit     Supine to sit: Min assist     General bed mobility comments: min HHA to come to [facility] from [facility] elevated position    Transfers Overall transfer level: Needs assistance Equipment used: None Transfers: Bed to chair/wheelchair/BSC, Sit to/from Stand Sit to Stand: Max assist          Lateral/Scoot Transfers: Mod assist General transfer comment: pt initiated R scoot transfer to drop arm recliner independently but was unable to complete task until mod A given around waist, in part due to visual impairment (pt seemed hesitant). Pt performed partial sit>stand with therapist in front of him with max A for bowel clean up and could maintain 10-15 secs with R knee flexed    Ambulation/Gait               General Gait Details: unable  Stairs            Wheelchair Mobility    Modified Rankin (Stroke Patients Only)       Balance Overall balance assessment: Needs assistance Sitting-balance support: Feet unsupported, Bilateral upper extremity supported Sitting balance-Leahy Scale: Fair Sitting balance - Comments: occasional posterior LOB  with limited ability to correct Postural control: Posterior lean Standing balance support: Bilateral upper extremity supported Standing balance-Leahy Scale: Zero                                Pertinent Vitals/Pain Pain Assessment Pain Assessment: No/denies pain    Home Living Family/patient expects to be discharged to:: Private residence Living Arrangements: Other relatives Available Help at Discharge: Family;Available PRN/intermittently Type of Home: House Home Access: Level entry       Home Layout: One level Home Equipment: Wheelchair - Multimedia programmer (2 wheels);Cane - single point;BSC/3in1;Grab bars - toilet;Grab bars - tub/shower;Hand held shower head Additional Comments: nieces care for him    Prior Function Prior Level of Function : Needs assist             Mobility Comments: niece, [name], is primary caregiver. Pt was transferring independently and mostly needed help with bathing. But [name] reports they have been having to assist him more with transfers recently ADLs Comments: assist needed for bathing     Hand Dominance   Dominant Hand: Right    Extremity/Trunk Assessment   Upper Extremity Assessment Upper Extremity Assessment: Generalized weakness    Lower Extremity Assessment Lower Extremity Assessment: RLE deficits/detail;LLE deficits/detail;Generalized weakness RLE Deficits / Details: transmet amp, knee flex contracture RLE Sensation: WNL RLE Coordination: decreased gross motor LLE Deficits / Details: BKA LLE Sensation: WNL LLE Coordination: decreased gross motor    Cervical / Trunk Assessment Cervical / Trunk Assessment: Kyphotic  Communication   Communication: No difficulties  Cognition Arousal/Alertness: Awake/alert Behavior During Therapy: WFL for tasks assessed/performed Overall Cognitive Status: History of cognitive impairments - at baseline                                 General Comments: mildly agitated but follows all commands and does not escalate. Niece is really good with him        General Comments General comments (skin integrity, edema, etc.): VSS    Exercises      Assessment/Plan    PT Assessment Patient needs continued PT services  PT Problem List Decreased strength;Decreased activity tolerance;Decreased balance;Decreased mobility;Decreased range of motion;Decreased coordination;Decreased cognition       PT Treatment Interventions DME instruction;Functional mobility training;Therapeutic activities;Therapeutic exercise;Balance training;Patient/family education    PT Goals (Current goals can be found in the Care Plan section)  Acute Rehab PT Goals Patient Stated Goal: return home PT Goal Formulation: With patient/family Time For Goal Achievement: 02/09/22 Potential to Achieve Goals: Good    Frequency Min 3X/week     Co-evaluation               AM-PAC PT "6 Clicks" Mobility  Outcome Measure Help needed turning from your back to your side while in a flat bed without using bedrails?: A Little Help needed moving from lying on your back to sitting on the side of a flat bed without using bedrails?: A Little Help needed moving to and from a bed to a chair (including a wheelchair)?: A Lot Help needed standing up from a chair using your arms (e.g., wheelchair or bedside chair)?: Total Help needed to walk in hospital room?: Total Help needed climbing 3-5 steps with a railing? : Total 6 Click Score: 11    End of Session Equipment Utilized During Treatment: Gait belt Activity Tolerance: Patient tolerated treatment  well Patient left: in chair;with call bell/phone within reach;with family/visitor present Nurse Communication: Mobility status PT Visit Diagnosis: Muscle weakness (generalized) (M62.81)    Time: 7048-8891 PT Time Calculation (min) (ACUTE ONLY): 42 min   Charges:   PT Evaluation $PT Eval Moderate Complexity: 1 Mod PT Treatments $Therapeutic Activity: 23-37 mins        Leighton Roach, PT  Acute Rehab Services Secure chat preferred Office [place] 01/26/2022, 2:18 PM

## 2022-01-26 NOTE — Progress Notes (Signed)
New Admission Note:  Arrival Method: By stretcher from ED around this time.  Mental Orientation: Alert and oriented x 2-3, dementia at baseline. Telemetry: Box 22, CCMD notified Assessment: Completed Skin: Completed, refer to flowsheets IV: Left AC Pain: Denies Tubes: None Safety Measures: Safety Fall Prevention Plan was given, discussed and signed. Admission: Completed [facility] Orientation: Patient has been orientated to the room, unit and the staff. Family: Daughter at bedside  Orders have been reviewed and implemented. Will continue to monitor the patient. Call light has been placed within reach and bed alarm has been activated.   Fara Olden, RN  Phone Number: [phone]

## 2022-01-26 NOTE — Progress Notes (Signed)
Initial Nutrition Assessment  DOCUMENTATION CODES:    (unable to assess, reassess on follow-up)  INTERVENTION:   Ensure Enlive po TID, each supplement provides 350 kcal and 20 grams of protein.  Continue liberalized diet  Need accurate weight; no weight this admission, no weight since 2022  NUTRITION DIAGNOSIS:   Inadequate oral intake related to lethargy/confusion, chronic illness, acute illness as evidenced by per patient/family report.   GOAL:   Patient will meet greater than or equal to 90% of their needs   MONITOR:   PO intake, Supplement acceptance, Labs, Weight trends  REASON FOR ASSESSMENT:   Consult Assessment of nutrition requirement/status  ASSESSMENT:   81 yo male admitted with acute metabolic encephalopathy, UTI. PMH includes dementia, metastatic prostate cancer on chemotherapy, HTN, DM, PVD s/p left BKA  Pt ate 0% at breakfast this AM; no other recorded po intake  Pt is A&Ox 2 with baseline dementia.   Noted pt lives by himself with family dropping in regularly. Family reporting significant decrease of oral intake including fluid. Reported 26 pound wt loss this year.   No weight for this admission; last weight from June 2022. Need new weight. Orders placed  Labs: reviewed Meds: reviewed  NUTRITION - FOCUSED PHYSICAL EXAM:  Unable to assess  Diet Order:   Diet Order             Diet regular Room service appropriate? Yes; Fluid consistency: Thin  Diet effective now                   EDUCATION NEEDS:   Not appropriate for education at this time  Skin:  Skin Assessment: Reviewed RN Assessment  Last BM:  PTA  Height:   Ht Readings from Last 1 Encounters:  11/13/21 '6\' 1"'$  (1.854 m)    Weight:   Wt Readings from Last 1 Encounters:  11/22/20 79.4 kg     BMI:  There is no height or weight on file to calculate BMI.  Estimated Nutritional Needs:   Kcal:  2000-2200 kcals  Protein:  100-115 g  Fluid:  >/= 2 L  Kerman Passey  MS, RDN, LDN, CNSC Registered Dietitian 3 Clinical Nutrition RD Pager and On-Call Pager Number Located in [place]

## 2022-01-26 NOTE — Progress Notes (Signed)
  Progress Note   Patient: [name] HDQ:[id number] DOB: Mar 26, 1941 DOA: 01/25/2022     0 DOS: the patient was seen and examined on 01/26/2022   Brief hospital course: [name] is a 81 y.o. 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   Patient has baseline dementia, lives by himself with family members dropped by regularly.  Last few days, family found the patient became less active episodes of confusion and occasional visual hallucinations.  Niece also reported patient appears to have significant decrease of oral intake including fluid.  Patient complained about suprapubic abdominal pain, and burning sensations of urination but denies any fever chills no diarrhea no cough.  Patient has metastatic prostate cancer follows with oncology and has been on chemotherapy, per report a 26 pound weight loss this year.  Assessment and Plan: UTI     - continue rocephin     - follow UCx  Acute metabolic encephalopathy     - mentation is improving but he is not back to baseline per family at bedside     - continue UTI tx  Elevated troponins     - trend trp, follow echo     - denies chest pain  Moderate protein calorie malnutrition     - dietitian consult  PVD     - continue home regimen  HTN     - BP soft, hold on regimen for now and continue fluids  Deconditioning      - PT eval  DM2     - SSI, glucose checks, DM diet  Chronic normocytic anemia     - stable, trend     Subjective: Denies complaints. No chest pain overnight  Physical Exam: Vitals:   01/25/22 1846 01/25/22 2115 01/25/22 2137 01/26/22 0927  BP:  104/61 (!) 102/56 (!) 106/58  Pulse:  78 72 76  Resp:  '16 16 16  '$ Temp: 99.4 F (37.4 C) 98 F (36.7 C) 98.4 F (36.9 C) 98.2 F (36.8 C)  TempSrc:  Oral Oral Oral  SpO2:  98% 100% 98%   General: 81 y.o. male resting in bed  in NAD Eyes: PERRL, normal sclera ENMT: Nares patent w/o discharge, orophaynx clear, dentition normal, ears w/o discharge/lesions/ulcers Neck: Supple, trachea midline Cardiovascular: RRR, +S1, S2, no m/g/r, equal pulses throughout Respiratory: CTABL, no w/r/r, normal WOB GI: BS+, NDNT, no masses noted, no organomegaly noted MSK: No e/c/c; :BKA Neuro: A&O x 2, no focal deficits Psyc: Appropriate interaction and affect, calm/cooperative  Data Reviewed:  Lab Results  Component Value Date   NA 138 01/25/2022   K 3.8 01/25/2022   CO2 24 01/25/2022   GLUCOSE 101 (H) 01/25/2022   BUN 16 01/25/2022   CREATININE 1.04 01/25/2022   CALCIUM 9.1 01/25/2022   GFRNONAA >60 01/25/2022   Lab Results  Component Value Date   WBC 9.2 01/25/2022   HGB 9.1 (L) 01/25/2022   HCT 28.8 (L) 01/25/2022   MCV 85.5 01/25/2022   PLT 298 01/25/2022   Family Communication: w/ niece at bedside  Disposition: Status is: Observation The patient will require care spanning > 2 midnights and should be moved to inpatient because: Mentation is not back to baseline.  Planned Discharge Destination: Home    Time spent: 25 minutes  Author: Jonnie Finner, DO 01/26/2022 9:40 AM  For on call review [email or web address].

## 2022-01-27 DIAGNOSIS — I5020 Unspecified systolic (congestive) heart failure: Secondary | ICD-10-CM

## 2022-01-27 DIAGNOSIS — G9340 Encephalopathy, unspecified: Secondary | ICD-10-CM | POA: Diagnosis not present

## 2022-01-27 NOTE — Evaluation (Signed)
Occupational Therapy Evaluation Patient Details Name: [name] MRN: [id number] DOB: 10/24/1940 Today's Date: 01/27/2022   History of Present Illness Pt is 81 yo male who presented on 01/25/22 with altered mentation and decreased oral intake. Suspected UTI.  PMH: metastatic prostate cancer, arthritis, glaucoma, HTN, osteomyelitis, PVD, DM2, CVA cerebellum, NSTEMI, gout, R transmetatarsal amputation, L BKA, dementia.   Clinical Impression   Pt lives alone, however reports his niece is with him daily and provides assist for ADLs/IADLs including bathing/dressing, pt uses w/c primarily for mobility. Pt currently needing supervision-mod A for ADLs, min guard for bed mobility, and min A for lateral scoot transfer at EOB, pt with visual deficits,reports only able to see shadows, however does correctly identify 2/3 numbers held up in central visual field. Pt presenting with impairments listed below, will follow acutely. Recommend HHOT at d/c.     Recommendations for follow up therapy are one component of a multi-disciplinary discharge planning process, led by the attending physician.  Recommendations may be updated based on patient status, additional functional criteria and insurance authorization.   Follow Up Recommendations  Home health OT    Assistance Recommended at Discharge Frequent or constant Supervision/Assistance  Patient can return home with the following Direct supervision/assist for medications management;Direct supervision/assist for financial management;Assist for transportation;Help with stairs or ramp for entrance;Assistance with cooking/housework;A little help with walking and/or transfers;A little help with bathing/dressing/bathroom    Functional Status Assessment  Patient has had a recent decline in their functional status and demonstrates the ability to make significant improvements in function in a reasonable and predictable amount of time.  Equipment Recommendations  None  recommended by OT (pt has all needed DME)    Recommendations for Other Services PT consult     Precautions / Restrictions Precautions Precautions: Fall Precaution Comments: has special shoe for R foot (not here at hospital), does not regularly don L prosthesis Restrictions Weight Bearing Restrictions: No Other Position/Activity Restrictions: blindness      Mobility Bed Mobility Overal bed mobility: Needs Assistance Bed Mobility: Supine to Sit, Sit to Supine     Supine to sit: Min guard Sit to supine: Min guard        Transfers Overall transfer level: Needs assistance Equipment used: None Transfers: Bed to chair/wheelchair/BSC, Sit to/from Stand            Lateral/Scoot Transfers: Min assist General transfer comment: simulated at EOB to L side      Balance Overall balance assessment: Needs assistance Sitting-balance support: Feet unsupported, Bilateral upper extremity supported Sitting balance-Leahy Scale: Fair                                     ADL either performed or assessed with clinical judgement   ADL Overall ADL's : Needs assistance/impaired Eating/Feeding: Supervision/ safety   Grooming: Supervision/safety   Upper Body Bathing: Minimal assistance   Lower Body Bathing: Moderate assistance   Upper Body Dressing : Minimal assistance   Lower Body Dressing: Moderate assistance Lower Body Dressing Details (indicate cue type and reason): donning/doffing sock Toilet Transfer: Minimal assistance;BSC/3in1;Requires drop arm   Toileting- Clothing Manipulation and Hygiene: Moderate assistance       Functional mobility during ADLs: Minimal assistance       Vision Patient Visual Report: No change from baseline Additional Comments: pt reports only being able to see shadows, can identify 2/3 numbers held up in central visual field  accurately     Perception     Praxis      Pertinent Vitals/Pain Pain Assessment Pain Assessment:  No/denies pain     Hand Dominance Right   Extremity/Trunk Assessment Upper Extremity Assessment Upper Extremity Assessment: Generalized weakness   Lower Extremity Assessment Lower Extremity Assessment: Defer to PT evaluation   Cervical / Trunk Assessment Cervical / Trunk Assessment: Kyphotic   Communication Communication Communication: No difficulties   Cognition Arousal/Alertness: Awake/alert Behavior During Therapy: WFL for tasks assessed/performed Overall Cognitive Status: History of cognitive impairments - at baseline                                 General Comments: hx of dementia at baseline     General Comments  VSS on RA    Exercises     Shoulder Instructions      Home Living Family/patient expects to be discharged to:: Private residence Living Arrangements: Alone Available Help at Discharge: Family;Available PRN/intermittently Type of Home: House Home Access: Level entry     Home Layout: One level     Bathroom Shower/Tub: Occupational psychologist: Standard Bathroom Accessibility: Yes   Home Equipment: Wheelchair - Multimedia programmer (2 wheels);Cane - single point;BSC/3in1;Grab bars - toilet;Grab bars - tub/shower;Hand held shower head   Additional Comments: nieces care for him, niece is a Pharmacist, hospital and is with him all the time currently, but pt will be alone for a few hours/day when niece returns to work      Prior Functioning/Environment Prior Level of Function : Needs assist             Mobility Comments: niece, [name], is primary caregiver. Pt was transferring independently and mostly needed help with bathing. But [name] reports they have been having to assist him more with transfers recently ADLs Comments: assist needed for bathing, dressing, assists with meals and med mgmt, and transportation        OT Problem List: Decreased strength;Decreased range of motion;Decreased activity tolerance;Impaired balance  (sitting and/or standing);Decreased cognition;Impaired vision/perception;Decreased safety awareness      OT Treatment/Interventions: Self-care/ADL training;Therapeutic exercise;Energy conservation;DME and/or AE instruction;Therapeutic activities;Patient/family education;Balance training    OT Goals(Current goals can be found in the care plan section) Acute Rehab OT Goals Patient Stated Goal: none stated OT Goal Formulation: With patient Time For Goal Achievement: 02/10/22 Potential to Achieve Goals: Good ADL Goals Pt Will Perform Upper Body Dressing: with supervision;sitting;bed level Pt Will Perform Lower Body Dressing: sitting/lateral leans;with min guard assist Pt Will Transfer to Toilet: with min guard assist;with transfer board;bedside commode Additional ADL Goal #1: pt will complete bed mobility with supervision in prep for ADLs  OT Frequency: Min 2X/week    Co-evaluation              AM-PAC OT "6 Clicks" Daily Activity     Outcome Measure Help from another person eating meals?: None Help from another person taking care of personal grooming?: A Little Help from another person toileting, which includes using toliet, bedpan, or urinal?: A Little Help from another person bathing (including washing, rinsing, drying)?: A Lot Help from another person to put on and taking off regular upper body clothing?: A Little Help from another person to put on and taking off regular lower body clothing?: A Lot 6 Click Score: 17   End of Session Nurse Communication: Mobility status  Activity Tolerance: Patient tolerated treatment well Patient  left: in bed;with call bell/phone within reach;with bed alarm set  OT Visit Diagnosis: Unsteadiness on feet (R26.81);Other abnormalities of gait and mobility (R26.89);Muscle weakness (generalized) (M62.81)                Time: 1282-0813 OT Time Calculation (min): 19 min Charges:  OT General Charges $OT Visit: 1 Visit OT Evaluation $OT Eval Low  Complexity: 1 Low  Lynnda Child, OTD, OTR/L Acute Rehab [phone] - [place] 01/27/2022, 10:51 AM

## 2022-01-27 NOTE — Progress Notes (Signed)
  Progress Note   Patient: [name] WUJ:[id number] DOB: 1940/09/04 DOA: 01/25/2022     1 DOS: the patient was seen and examined on 01/27/2022   Brief hospital course: [name] is a 81 y.o. 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   Patient has baseline dementia, lives by himself with family members dropped by regularly.  Last few days, family found the patient became less active episodes of confusion and occasional visual hallucinations.  Niece also reported patient appears to have significant decrease of oral intake including fluid.  Patient complained about suprapubic abdominal pain, and burning sensations of urination but denies any fever chills no diarrhea no cough.  Patient has metastatic prostate cancer follows with oncology and has been on chemotherapy, per report a 26 pound weight loss this year.  8/13 needs to have bedside reports patient is less confused and doing better.  Not quite at baseline yet  Assessment and Plan: UTI -Continue Rocephin I do not see any urine culture will verify with nursing if it was obtained  Acute metabolic encephalopathy Likely from UTI Improving but not quite at baseline Continue treatment with Rocephin as for above  Elevated troponins Denies any shortness of breath or chest pain Echo with EF 40% and global hypokinesis Will consult cardiology  Moderate protein calorie malnutrition     RD consulted, see note.   PVD Continue statin and Plavix  HTN     - BP soft, hold on regimen for now and continue fluids  Deconditioning     PT/OT rec. HH  DM2     - SSI, glucose checks, DM diet  Chronic normocytic anemia     - stable, trend   Subjective: Denies complaints. No chest pain overnight  Physical Exam: Vitals:   01/26/22 1707 01/26/22 1846 01/27/22 0520 01/27/22 0955  BP: 116/70  98/61  102/63  Pulse: 77  70 74  Resp: 18   18  Temp: 98.5 F (36.9 C)  98.4 F (36.9 C) 98.5 F (36.9 C)  TempSrc: Oral  Oral Oral  SpO2: 100%  100% 100%  Weight:  72.1 kg     Calm, NAD Cta no w/r Reg s1/s2 no gallop Soft benign +bs No edema. bka Awake and oriented to person and place, not date. Mood and affect appropriate in current setting   Data Reviewed:  Lab Results  Component Value Date   NA 138 01/26/2022   K 3.6 01/26/2022   CO2 24 01/26/2022   GLUCOSE 86 01/26/2022   BUN 15 01/26/2022   CREATININE 1.01 01/26/2022   CALCIUM 8.4 (L) 01/26/2022   GFRNONAA >60 01/26/2022   Lab Results  Component Value Date   WBC 8.4 01/26/2022   HGB 7.4 (L) 01/26/2022   HCT 22.7 (L) 01/26/2022   MCV 83.8 01/26/2022   PLT 240 01/26/2022   Family Communication: w/ niece at bedside  Disposition: Status is: Inpatient The patient still inpatient as he requires IV treatment.  Found with new systolic dysfunction.  Cardiology consulted.   DVT prophylaxis: Lovenox  Planned Discharge Destination: Home with home health   Time spent: 35 minutes  Author: Nolberto Hanlon, MD 01/27/2022 12:34 PM  For on call review [email or web address].

## 2022-01-27 NOTE — Consult Note (Signed)
Cardiology Consult Note:   Patient ID: [name] MRN: [id number]; DOB: 16-Dec-1940   Admission date: 01/25/2022  PCP:  [name], MD   [facility] Providers Cardiologist:  Candee Furbish, MD        Chief Complaint:  Cardiomyopathy in the setting of dementia  Patient Profile:   [name] is a 81 y.o. male with metastatic prostate cancer, on chemotherapy; HTN, PAD s/p L BKA and HLD history of prior stroke, history of baseline dementia and profound weight loss.  History of Present Illness:   [name] notes that he is feeling fine.   Patient does not remember the confusion.  He does not remember the visual hallucinations.  He feels fine.  No CP, SOB palpitations.  He is s/p R TMA and L BKA and he notes no leg pain.  Niece is in the room with him: she notes that he does not like medication.  He does not take the medication that have been recommended by oncology (per niece).  Notes that she had a appointment to meet with palliative care tomorrow to figure out what he wants.  He is being treated for a UTI. Troponin mildly elevated but relatively flat. 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  In some view, cannot rule out right atrial mass. Tele notable for rare PVCS in couplets. Niece notes that he is near baseline.   Past Medical History:  Diagnosis Date   Arthritis    Denies   Glaucoma    High cholesterol    Hypertension    denies   Osteomyelitis ([place])    Right great toe   Peripheral vascular disease ([place])    diabetic  with osteomylitis   Pre-diabetes    Stroke (cerebrum) ([place]) 10/2018    Past Surgical History:  Procedure Laterality Date   AMPUTATION  07/23/2011   Procedure: AMPUTATION DIGIT;  Surgeon: Newt Minion, MD;  Location: [place];  Service: Orthopedics;  Laterality: Right;  Right Great Toe Amputation   AMPUTATION Right 09/18/2012   Procedure: Right Foot 2nd Ray Amputation;  Surgeon: Newt Minion, MD;   Location: [place];  Service: Orthopedics;  Laterality: Right;  Right Foot 2nd Ray Amputation   AMPUTATION Right 10/14/2012   Procedure: AMPUTATION FOOT;  Surgeon: Newt Minion, MD;  Location: [place];  Service: Orthopedics;  Laterality: Right;  Right Foot Transmetatarsal Amputation   AMPUTATION Left 09/23/2014   Procedure: Left Foot Transmetatarsal Amputation;  Surgeon: Newt Minion, MD;  Location: [place];  Service: Orthopedics;  Laterality: Left;   AMPUTATION Left 11/22/2020   Procedure: LEFT BELOW KNEE AMPUTATION;  Surgeon: Newt Minion, MD;  Location: [place];  Service: Orthopedics;  Laterality: Left;   BACK SURGERY     lower   CARPAL TUNNEL RELEASE Right 01/30/2018   Procedure: RIGHT CARPAL TUNNEL RELEASE;  Surgeon: Newt Minion, MD;  Location: [place];  Service: Orthopedics;  Laterality: Right;   CIRCUMCISION  2010   COLONOSCOPY     FOOT FRACTURE SURGERY Left 1970's   "broke it playing football" (10/12/2012)   HUMERUS FRACTURE SURGERY W/ IMPLANT Right 1960's   "put a plate in it" (09/07/4008)   I & D EXTREMITY Right 03/24/2020   Procedure: EXCISION RIGHT MEDIAL CUNEFORM;  Surgeon: Newt Minion, MD;  Location: [place];  Service: Orthopedics;  Laterality: Right;   INCISION AND DRAINAGE OF WOUND Left 2006   "foot" (10/12/2012)   LUMBAR DISC SURGERY  2008     Medications Prior to Admission: Prior to Admission medications   Medication Sig Start Date End Date Taking? Authorizing Provider  acetaminophen-codeine (TYLENOL #3) 300-30 MG tablet Take 1 tablet by mouth every 6 (six) hours as needed for moderate pain. 10/19/21  Yes Suzan Slick, NP  atorvastatin (LIPITOR) 80 MG tablet Take 80 mg by mouth daily.   Yes [provider]  brimonidine (ALPHAGAN) 0.2 % ophthalmic solution Place 1 drop into the right eye 2 (two) times daily.   Yes [provider]  dorzolamidel-timolol (COSOPT) 22.3-6.8 MG/ML SOLN ophthalmic solution Place 1 drop into the right eye 2 (two) times daily.   Yes  [provider]  Ensure (ENSURE) Take 237 mLs by mouth 2 (two) times daily between meals.   Yes [provider]  hydroxypropyl methylcellulose / hypromellose (ISOPTO TEARS / GONIOVISC) 2.5 % ophthalmic solution Place 1 drop into both eyes 3 (three) times daily as needed for dry eyes. 09/01/20  Yes Valarie Merino, MD  mirtazapine (REMERON) 7.5 MG tablet Take 7.5 mg by mouth at bedtime.   Yes [provider]  clopidogrel (PLAVIX) 75 MG tablet Take 1 tablet (75 mg total) by mouth daily. Patient not taking: Reported on 01/25/2022 11/01/18   Mariel Aloe, MD  cycloSPORINE (RESTASIS) 0.05 % ophthalmic emulsion Place 1 drop into both eyes 2 (two) times daily. Patient not taking: Reported on 01/25/2022    [provider]  Dorzolamide HCl-Timolol Mal PF 2-0.5 % SOLN Place 1 drop into both eyes 2 (two) times daily. Patient not taking: Reported on 01/25/2022 03/28/20   Aline August, MD  gabapentin (NEURONTIN) 300 MG capsule Take 1 capsule (300 mg total) by mouth at bedtime. Patient not taking: Reported on 01/25/2022 10/22/21   Newt Minion, MD  ketorolac (ACULAR) 0.5 % ophthalmic solution Place 1 drop into both eyes daily. Patient not taking: Reported on 01/25/2022    [provider]  oxyCODONE-acetaminophen (PERCOCET/ROXICET) 5-325 MG tablet Take 1 tablet by mouth every 6 (six) hours as needed for severe pain. Patient not taking: Reported on 01/25/2022 03/13/21   Sponseller, Gypsy Balsam, PA-C     Allergies:   No Known Allergies  Social History:   Social History   Socioeconomic History   Marital status: Single    Spouse name: Not on file   Number of children: 1   Years of education: 9   Highest education level: 9th grade  Occupational History   Not on file  Tobacco Use   Smoking status: Every Day    Types: Pipe    Passive exposure: Current   Smokeless tobacco: Never   Tobacco comments:    Uses 2-3 barrels a day.  Vaping Use   Vaping Use: Never used   Substance and Sexual Activity   Alcohol use: No    Alcohol/week: 0.0 standard drinks of alcohol   Drug use: No   Sexual activity: Not Currently  Other Topics Concern   Not on file  Social History Narrative   Lives alone in an apartment on the first floor.  Has one daughter.  Retired Games developer.  Education: 12th grade.    Social Determinants of Health   Financial Resource Strain: Low Risk  (05/21/2021)   Overall Financial Resource Strain (CARDIA)    Difficulty of Paying Living Expenses: Not very hard  Food Insecurity: No Food Insecurity (05/21/2021)   Hunger Vital Sign    Worried About Running Out of Food in the  Last Year: Never true    [place] in the Last Year: Never true  Transportation Needs: No Transportation Needs (05/21/2021)   PRAPARE - Hydrologist (Medical): No    Lack of Transportation (Non-Medical): No  Physical Activity: Inactive (05/21/2021)   Exercise Vital Sign    Days of Exercise per Week: 0 days    Minutes of Exercise per Session: 0 min  Stress: No Stress Concern Present (05/21/2021)   [place]    Feeling of Stress : Only a little  Social Connections: Unknown (05/21/2021)   Social Connection and Isolation Panel [NHANES]    Frequency of Communication with Friends and Family: More than three times a week    Frequency of Social Gatherings with Friends and Family: More than three times a week    Attends Religious Services: 1 to 4 times per year    Active Member of [employer] or Organizations: No    Attends Archivist Meetings: Never    Marital Status: Not on file  Intimate Partner Violence: Not At Risk (05/21/2021)   Humiliation, Afraid, Rape, and Kick questionnaire    Fear of Current or Ex-Partner: No    Emotionally Abused: No    Physically Abused: No    Sexually Abused: No    Family History:   The patient's family history includes Diabetes Mellitus  II in an other family member. There is no history of Anesthesia problems.    ROS:  Please see the history of present illness.  All other ROS reviewed and negative.     Physical Exam/Data:   Vitals:   01/26/22 1707 01/26/22 1846 01/27/22 0520 01/27/22 0955  BP: 116/70  98/61 102/63  Pulse: 77  70 74  Resp: 18   18  Temp: 98.5 F (36.9 C)  98.4 F (36.9 C) 98.5 F (36.9 C)  TempSrc: Oral  Oral Oral  SpO2: 100%  100% 100%  Weight:  72.1 kg      Intake/Output Summary (Last 24 hours) at 01/27/2022 1459 Last data filed at 01/27/2022 0900 Gross per 24 hour  Intake 1336.02 ml  Output 1150 ml  Net 186.02 ml      01/26/2022    6:46 PM  Last 3 Weights  Weight (lbs) 159 lb  Weight (kg) 72.122 kg     Body mass index is 20.98 kg/m.  General:  Elderly male, thin and cachetic HEENT: normal Neck: no JVD Cardiac:  normal S1, S2; RRR; no murmur  Lungs:  clear to auscultation bilaterally, no wheezing, rhonchi or rales  Abd: soft, nontender, no hepatomegaly  Ext: no edema Musculoskeletal:  s/p R TMA and L BKA Skin: warm and dry  Neuro:  CNs 2-12 intact, no focal abnormalities noted Psych:  Normal affect   EKG:  The ECG that was done  was personally reviewed and demonstrates SR 1st HB RBBB and LAGFB, TFB   Laboratory Data:  High Sensitivity Troponin:   Recent Labs  Lab 01/25/22 1220 01/25/22 1446 01/26/22 0756  TROPONINIHS 139* 160* 207*      Chemistry Recent Labs  Lab 01/25/22 1220 01/26/22 0756  NA 138 138  K 3.8 3.6  CL 104 107  CO2 24 24  GLUCOSE 101* 86  BUN 16 15  CREATININE 1.04 1.01  CALCIUM 9.1 8.4*  GFRNONAA >60 >60  ANIONGAP 10 7    Recent Labs  Lab 01/25/22 1220 01/26/22 0756  PROT  8.0 6.7  ALBUMIN 3.4* 2.7*  AST 19 16  ALT 10 8  ALKPHOS 149* 101  BILITOT 0.7 0.5   Lipids No results for input(s): "CHOL", "TRIG", "HDL", "LABVLDL", "LDLCALC", "CHOLHDL" in the last 168 hours. Hematology Recent Labs  Lab 01/25/22 1220 01/26/22 0756  WBC  9.2 8.4  RBC 3.37* 2.71*  HGB 9.1* 7.4*  HCT 28.8* 22.7*  MCV 85.5 83.8  MCH 27.0 27.3  MCHC 31.6 32.6  RDW 17.2* 17.3*  PLT 298 240   Thyroid No results for input(s): "TSH", "FREET4" in the last 168 hours. BNPNo results for input(s): "BNP", "PROBNP" in the last 168 hours.  DDimer No results for input(s): "DDIMER" in the last 168 hours.   Radiology/Studies:  No results found.   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 Metastatic Prostate cancer Dementia; protein calorie malnutrition with outpatient PC visit scheduled for 01/28/22 - NYHA I , Stage C, euvolemic - GDMT limited by blood pressure; UTI (defer SGLT2i) - if BP rises, potentially aldactone 12.5 mg PO daily tomorrow - we have discussed the imaging findings; I have ordered Kappa/Lamba Light chains; I have not ordered a 24 hour urine light chain evaluation - we have talk about whether or not we would attemtpt tafamadis or other therapies if we were to find Cardiac Amyloidosis - Presently, patient is amenable to get additional testing to better assess his disease state, will get CMR tomorrow (Mass protocol for RA+ VENC Pulmonic Valve + LGE for Amyloid).  If no other readers are available I would be able to read this very late on 01/28/22) -He is still on the fence about whether he would actually want treatment if we found this; and given his Niece noting that he often doesn't take medications anyway, he knows that we could stop if it that is his preference  If primary team elects to not consult PC, he will need his outpatient PC visit rescheduled  PAD with prior TMA and BKA NSTEMI (likely demand related to infection) - continue plavix and statin, presently asymptomatic   Risk Assessment/Risk Scores:     New York Heart Association (NYHA) Functional Class NYHA Class I    For questions or updates, please contact [place] Please consult [email or web address] for contact info under      Signed, Werner Lean, MD  01/27/2022 2:59 PM

## 2022-01-28 ENCOUNTER — Inpatient Hospital Stay (HOSPITAL_BASED_OUTPATIENT_CLINIC_OR_DEPARTMENT_OTHER): Payer: Medicare Other

## 2022-01-28 ENCOUNTER — Telehealth: Payer: Medicare Other | Admitting: Hospice

## 2022-01-28 DIAGNOSIS — R778 Other specified abnormalities of plasma proteins: Secondary | ICD-10-CM | POA: Diagnosis not present

## 2022-01-28 DIAGNOSIS — E854 Organ-limited amyloidosis: Secondary | ICD-10-CM

## 2022-01-28 DIAGNOSIS — I739 Peripheral vascular disease, unspecified: Secondary | ICD-10-CM

## 2022-01-28 DIAGNOSIS — I5041 Acute combined systolic (congestive) and diastolic (congestive) heart failure: Secondary | ICD-10-CM | POA: Diagnosis not present

## 2022-01-28 DIAGNOSIS — I43 Cardiomyopathy in diseases classified elsewhere: Secondary | ICD-10-CM | POA: Diagnosis not present

## 2022-01-28 MED ORDER — CHLORHEXIDINE GLUCONATE CLOTH 2 % EX PADS
6.0000 | MEDICATED_PAD | Freq: Every day | CUTANEOUS | Status: DC
  Administered 2022-01-28 – 2022-01-30 (×3): 6 via TOPICAL

## 2022-01-28 MED ORDER — GADOBUTROL 1 MMOL/ML IV SOLN
9.0000 mL | Freq: Once | INTRAVENOUS | Status: AC | PRN
Start: 2022-01-28 — End: 2022-01-28
  Administered 2022-01-28: 9 mL via INTRAVENOUS

## 2022-01-28 MED ORDER — MUPIROCIN 2 % EX OINT
1.0000 | TOPICAL_OINTMENT | Freq: Two times a day (BID) | CUTANEOUS | Status: DC
  Administered 2022-01-28 – 2022-01-30 (×5): 1 via NASAL
  Filled 2022-01-28: qty 22

## 2022-01-28 NOTE — Progress Notes (Signed)
PROGRESS NOTE    [name]  VOH:[id number] DOB: 12-03-1940 DOA: 01/25/2022 PCP: [name], MD  81/M with metastatic prostate cancer on chemotherapy, mild dementia, hypertension, chronic anemia, peripheral vascular disease with left BKA, CVA lives alone, has family members to check in periodically brought to the ED after he was found to be more confused than usual with decreased oral intake and lower abdominal discomfort. -Noted to have UTI, metabolic encephalopathy -My partner ordered an echo for elevated troponins which noted cardiomyopathy with concern for amyloidosis  Subjective: -Pleasant, no complaints, cognitive deficits noted  Assessment and Plan:  UTI Acute metabolic encephalopathy -UA was cloudy with large leukocytes, greater than 50 WBCs -Started on IV ceftriaxone 2 days ago, unfortunately urine cultures were not sent, too late now, will complete 5-day course of ABX -Monitor for urinary retention -Mental status improved, close to baseline -PT OT eval   Elevated troponins Cardiomyopathy -Echo with EF of 40%, concern for cardiac amyloidosis -Cards following now, plan for further work-up -will also request palliative consult for goals of care, this was due as outpatient  Metastatic prostate cancer -With bone mets and adenopathy, followed by Dr. Alen Blew -History of poor compliance with Gillermina Phy -On Eligard for androgen deprivation every few months  Acute on chronic anemia -Worsened since admission, likely secondary to hemodilution, has been on fluids for 3 days, check anemia panel -Suspect baseline anemia is secondary to chronic disease, malignancy   Moderate protein calorie malnutrition     RD consulted, see note.    PVD, left BKA Right transmetatarsal amputation Continue statin and Plavix  Suspected dementia, cognitive deficits -PT OT eval -Palliative care consult   HTN -BP stable, antihypertensives on hold, discontinue IV fluids   DM2     -  SSI, glucose checks, DM diet   DVT prophylaxis: Lovenox Code Status: Full code Family Communication: No family at bedside, will update niece Disposition Plan: Home with home health services at increased supervision, palliative care eval pending as well  Consultants:    Procedures:   Antimicrobials:    Objective: Vitals:   01/27/22 1624 01/27/22 2020 01/27/22 2306 01/28/22 0540  BP: 114/62 108/65  (!) 103/57  Pulse: 75 79  72  Resp: '18 18  16  '$ Temp: 98.1 F (36.7 C) 100 F (37.8 C) 99.6 F (37.6 C) 99.5 F (37.5 C)  TempSrc: Oral Oral Oral Oral  SpO2: 100% 99%  99%  Weight:    67.3 kg    Intake/Output Summary (Last 24 hours) at 01/28/2022 1135 Last data filed at 01/28/2022 0546 Gross per 24 hour  Intake 240 ml  Output 625 ml  Net -385 ml   Filed Weights   01/26/22 1846 01/28/22 0540  Weight: 72.1 kg 67.3 kg    Examination:  General exam: Chronically ill male sitting up in bed, AAO x2, cognitive deficits noted CVS: S1-S2, regular rhythm Lungs: Clear bilaterally Abdomen: Soft, nontender, bowel sounds present Extremities: No edema Skin: No rashes on exposed skin Psych: Poor insight and judgment  Data Reviewed:   CBC: Recent Labs  Lab 01/25/22 1220 01/26/22 0756  WBC 9.2 8.4  NEUTROABS 6.8 5.7  HGB 9.1* 7.4*  HCT 28.8* 22.7*  MCV 85.5 83.8  PLT 298 694   Basic Metabolic Panel: Recent Labs  Lab 01/25/22 1220 01/26/22 0756  NA 138 138  K 3.8 3.6  CL 104 107  CO2 24 24  GLUCOSE 101* 86  BUN 16 15  CREATININE 1.04 1.01  CALCIUM 9.1 8.4*  GFR: Estimated Creatinine Clearance: 56.5 mL/min (by C-G formula based on SCr of 1.01 mg/dL). Liver Function Tests: Recent Labs  Lab 01/25/22 1220 01/26/22 0756  AST 19 16  ALT 10 8  ALKPHOS 149* 101  BILITOT 0.7 0.5  PROT 8.0 6.7  ALBUMIN 3.4* 2.7*   No results for input(s): "LIPASE", "AMYLASE" in the last 168 hours. No results for input(s): "AMMONIA" in the last 168 hours. Coagulation  Profile: No results for input(s): "INR", "PROTIME" in the last 168 hours. Cardiac Enzymes: Recent Labs  Lab 01/25/22 1220  CKTOTAL 99   BNP (last 3 results) No results for input(s): "PROBNP" in the last 8760 hours. HbA1C: No results for input(s): "HGBA1C" in the last 72 hours. CBG: No results for input(s): "GLUCAP" in the last 168 hours. Lipid Profile: No results for input(s): "CHOL", "HDL", "LDLCALC", "TRIG", "CHOLHDL", "LDLDIRECT" in the last 72 hours. Thyroid Function Tests: No results for input(s): "TSH", "T4TOTAL", "FREET4", "T3FREE", "THYROIDAB" in the last 72 hours. Anemia Panel: No results for input(s): "VITAMINB12", "FOLATE", "FERRITIN", "TIBC", "IRON", "RETICCTPCT" in the last 72 hours. Urine analysis:    Component Value Date/Time   COLORURINE YELLOW 01/25/2022 1611   APPEARANCEUR CLOUDY (A) 01/25/2022 1611   LABSPEC 1.015 01/25/2022 1611   PHURINE 5.0 01/25/2022 1611   GLUCOSEU NEGATIVE 01/25/2022 1611   HGBUR MODERATE (A) 01/25/2022 1611   HGBUR trace-intact 09/16/2007 1115   BILIRUBINUR NEGATIVE 01/25/2022 1611   KETONESUR NEGATIVE 01/25/2022 1611   PROTEINUR NEGATIVE 01/25/2022 1611   UROBILINOGEN 1.0 10/24/2013 1220   NITRITE NEGATIVE 01/25/2022 1611   LEUKOCYTESUR LARGE (A) 01/25/2022 1611   Sepsis Labs: '@LABRCNTIP'$ (procalcitonin:4,lacticidven:4)  )No results found for this or any previous visit (from the past 240 hour(s)).   Radiology Studies: No results found.   Scheduled Meds:  atorvastatin  80 mg Oral Daily   brimonidine  1 drop Right Eye BID   Chlorhexidine Gluconate Cloth  6 each Topical Q0600   clopidogrel  75 mg Oral Daily   cycloSPORINE  1 drop Both Eyes BID   dorzolamide-timolol  1 drop Right Eye BID   enoxaparin (LOVENOX) injection  40 mg Subcutaneous Q24H   feeding supplement  237 mL Oral TID BM   mupirocin ointment  1 Application Nasal BID   Continuous Infusions:  cefTRIAXone (ROCEPHIN)  IV 1 g (01/27/22 1755)     LOS: 2 days     Time spent: 6mn    PDomenic Polite MD [facility]   01/28/2022, 11:35 AM

## 2022-01-28 NOTE — TOC Initial Note (Addendum)
Transition of Care [facility]) - Initial/Assessment Note    Patient Details  Name: [name] MRN: [id number] Date of Birth: [birth year]  [facility] of [facility]) CM/SW Contact:    Cyndi Bender, RN Phone Number: 01/28/2022, 4:07 PM  Clinical Narrative:                 Damaris Schooner to Niece, [name], regarding transition needs.[name] states patient lives alone but there are 4 people that check on him through out the day. The family helps him with his medications but sometimes he is noncompliant.  Patient has left BKA. Therapy recommends home health. [name] defers to [facility] to find highly rated [place] agency. Spoke to Rupert with [facility] and referral accepted for RN (to help with medication compliance ) PT,OT,aide.  Patient has all needed DME.  Address, Phone number and PCP verified.  TOC will continue to follow for needs.   Expected Discharge Plan: [place] Barriers to Discharge: Continued Medical Work up   Patient Goals and CMS Choice Patient states their goals for this hospitalization and ongoing recovery are:: return home CMS Medicare.gov Compare Post Acute Care list provided to:: Patient Represenative (must comment) Choice offered to / list presented to :  (niece)  Expected Discharge Plan and Services Expected Discharge Plan: [place]   Discharge Planning Services: CM Consult   Living arrangements for the past 2 months: Apartment                           HH Arranged: RN, PT, OT, Nurse's Aide HH Agency: [facility] Date [facility] Agency Contacted: 01/28/22 Time HH Agency Contacted: 1206 Representative spoke with at [place]: [place] Arrangements/Services Living arrangements for the past 2 months: Apartment Lives with:: Self Patient language and need for interpreter reviewed:: Yes Do you feel safe going back to the place where you live?: Yes      Need for Family Participation in Patient Care: Yes (Comment) Care giver support system in  place?: Yes (comment)   Criminal Activity/Legal Involvement Pertinent to Current Situation/Hospitalization: No - Comment as needed  Activities of Daily Living      Permission Sought/Granted                  Emotional Assessment       Orientation: : Fluctuating Orientation (Suspected and/or reported [place]) Alcohol / Substance Use: Not Applicable Psych Involvement: No (comment)  Admission diagnosis:  Encephalopathy [G93.40] Elevated troponin [R77.8] Acute encephalopathy [G93.40] UTI (urinary tract infection) [N39.0] Patient Active Problem List   Diagnosis Date Noted   Acute combined systolic (congestive) and diastolic (congestive) heart failure (HCC)    Elevated troponin 01/26/2022   Protein-calorie malnutrition, moderate ([place]) 01/26/2022   PVD (peripheral vascular disease) ([place]) 01/26/2022   Physical deconditioning 01/26/2022   Normocytic anemia 01/26/2022   UTI (urinary tract infection) 01/26/2022   Encephalopathy 01/25/2022   Prostate cancer ([place]) 06/28/2021   Abscess of bursa of left ankle 11/22/2020   Subacute osteomyelitis, left ankle and foot ([place])    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 ([place]) 04/12/2020   Acute encephalopathy 03/20/2020   SIRS (systemic inflammatory response syndrome) ([place]) 03/20/2020   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[place]) 03/20/2020   ARF (acute renal failure) ([place]) 03/20/2020   Midfoot ulcer, left, limited to breakdown of skin ([place]) 12/29/2018   Occipital stroke ([place]) 10/31/2018   DM2 (diabetes mellitus, type 2) (  [place]) 10/31/2018   NSTEMI (non-ST elevated myocardial infarction) ([place]) 05/28/2018   Syncope 05/28/2018   HTN (hypertension) 05/28/2018   Hyperlipemia, mixed 05/28/2018   Rash and nonspecific skin eruption 12/16/2017   Pain in right hand 09/11/2017   Carpal tunnel syndrome, right upper limb 07/01/2017   Arthritis of carpometacarpal [facility]) joint of left thumb 12/30/2016   Midfoot skin ulcer, right, limited to  breakdown of skin ([place]) 07/22/2016   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[place]) 09/23/2014   Osteomyelitis of ankle or foot, left, acute ([place]) 08/23/2014   Cellulitis 10/12/2012   Chest pain 10/12/2012   Tobacco abuse 10/12/2012   ABSCESS, AXILLA, LEFT 02/23/2008   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   CHEST WALL PAIN, ACUTE 12/03/2007   CANDIDIASIS, GLANS PENIS 09/16/2007   HEADACHE 08/04/2007   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 ([place]) 03/05/2007   Other and unspecified hyperlipidemia 03/05/2007   GOUT 03/05/2007   ERECTILE DYSFUNCTION 03/05/2007   EXTERNAL HEMORRHOIDS 03/05/2007   VENTRAL HERNIA 03/05/2007   FATTY LIVER DISEASE 03/05/2007   Osteoarthritis 03/05/2007   HEMORRHOIDS, INTERNAL 03/17/2005   COLONIC POLYPS, HX OF 03/17/2005   PCP:  [name], MD Pharmacy:   [facility] [place], [place] - [address] [place] [address] Phone: [phone] Fax: [phone]  [facility] - [place], [place] - [address] [place] [address] Phone: [phone] Fax: [phone]     Social Determinants of Health (SDOH) Interventions    Readmission Risk Interventions     No data to display

## 2022-01-28 NOTE — Progress Notes (Signed)
Progress Note  Patient Name: Acel Natzke Date of Encounter: 01/28/2022  [facility] Cardiologist: Candee Furbish, MD   Subjective   BP 103/57 this morning.  Denies any chest pain or dyspnea.  Oriented x2  Inpatient Medications    Scheduled Meds:  atorvastatin  80 mg Oral Daily   brimonidine  1 drop Right Eye BID   Chlorhexidine Gluconate Cloth  6 each Topical Q0600   clopidogrel  75 mg Oral Daily   cycloSPORINE  1 drop Both Eyes BID   dorzolamide-timolol  1 drop Right Eye BID   enoxaparin (LOVENOX) injection  40 mg Subcutaneous Q24H   feeding supplement  237 mL Oral TID BM   mupirocin ointment  1 Application Nasal BID   Continuous Infusions:  cefTRIAXone (ROCEPHIN)  IV 1 g (01/27/22 1755)   PRN Meds: hydroxypropyl methylcellulose / hypromellose, ondansetron **OR** ondansetron (ZOFRAN) IV   Vital Signs    Vitals:   01/27/22 1624 01/27/22 2020 01/27/22 2306 01/28/22 0540  BP: 114/62 108/65  (!) 103/57  Pulse: 75 79  72  Resp: '18 18  16  '$ Temp: 98.1 F (36.7 C) 100 F (37.8 C) 99.6 F (37.6 C) 99.5 F (37.5 C)  TempSrc: Oral Oral Oral Oral  SpO2: 100% 99%  99%  Weight:    67.3 kg    Intake/Output Summary (Last 24 hours) at 01/28/2022 0948 Last data filed at 01/28/2022 0546 Gross per 24 hour  Intake 240 ml  Output 625 ml  Net -385 ml      01/28/2022    5:40 AM 01/26/2022    6:46 PM  Last 3 Weights  Weight (lbs) 148 lb 5.9 oz 159 lb  Weight (kg) 67.3 kg 72.122 kg      Telemetry    NSR - Personally Reviewed  ECG    No new ECG - Personally Reviewed  Physical Exam   GEN: Chronically ill appearing Neck: No JVD Cardiac: RRR, no murmurs, rubs, or gallops.  Respiratory: Clear to auscultation bilaterally. GI: Soft, nontender, non-distended  MS: No edema Neuro:  Oriented x2 Psych: Normal affect   Labs    High Sensitivity Troponin:   Recent Labs  Lab 01/25/22 1220 01/25/22 1446 01/26/22 0756  TROPONINIHS 139* 160* 207*     Chemistry Recent  Labs  Lab 01/25/22 1220 01/26/22 0756  NA 138 138  K 3.8 3.6  CL 104 107  CO2 24 24  GLUCOSE 101* 86  BUN 16 15  CREATININE 1.04 1.01  CALCIUM 9.1 8.4*  PROT 8.0 6.7  ALBUMIN 3.4* 2.7*  AST 19 16  ALT 10 8  ALKPHOS 149* 101  BILITOT 0.7 0.5  GFRNONAA >60 >60  ANIONGAP 10 7    Lipids No results for input(s): "CHOL", "TRIG", "HDL", "LABVLDL", "LDLCALC", "CHOLHDL" in the last 168 hours.  Hematology Recent Labs  Lab 01/25/22 1220 01/26/22 0756  WBC 9.2 8.4  RBC 3.37* 2.71*  HGB 9.1* 7.4*  HCT 28.8* 22.7*  MCV 85.5 83.8  MCH 27.0 27.3  MCHC 31.6 32.6  RDW 17.2* 17.3*  PLT 298 240   Thyroid No results for input(s): "TSH", "FREET4" in the last 168 hours.  BNPNo results for input(s): "BNP", "PROBNP" in the last 168 hours.  DDimer No results for input(s): "DDIMER" in the last 168 hours.   Radiology    ECHOCARDIOGRAM COMPLETE  Result Date: 01/26/2022    ECHOCARDIOGRAM REPORT   Patient Name:   TAJAI IHDE Date of Exam: 01/26/2022 Medical Rec #:  [id number]    Height:       73.0 in Accession #:    [id number]   Weight:       175.0 lb Date of Birth:  10/06/1940    BSA:          2.033 m Patient Age:    81 years     BP:           106/58 mmHg Patient Gender: M            HR:           71 bpm. Exam Location:  Inpatient Procedure: 2D Echo, Cardiac Doppler, Color Doppler and Strain Analysis Indications:    Elevated troponin  History:        Patient has prior history of Echocardiogram examinations, most                 recent 11/01/2018. Stroke; Risk Factors:Current Smoker,                 Dyslipidemia and Diabetes.  Sonographer:    Merrie Roof RDCS Referring Phys: [id number] [place]  1. Left ventricular ejection fraction, by estimation, is 40%. The left ventricle has moderately decreased function. The left ventricle demonstrates global hypokinesis. There is severe asymmetric left ventricular hypertrophy. Left ventricular diastolic parameters are indeterminate. Elevated left  atrial pressure.  2. There is severe atrial enlargement, and severe LV hypertrophy (1.9 cm septum with speckled patter) and RV hypertrophy. Strain imaging shows apical sparing. 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  3. Right ventricular systolic function is normal. The right ventricular size is normal. Moderately increased right ventricular wall thickness.  4. Left atrial size was severely dilated.  5. The mitral valve is normal in structure. No evidence of mitral valve regurgitation. No evidence of mitral stenosis.  6. The aortic valve is tricuspid. Aortic valve regurgitation is not visualized. No aortic stenosis is present.  7. Pulmonic valve regurgitation is moderate.In some view, there is evidence of an echodensity in the right atrium. This could be related to compression of the right atrium by an non cardiac structure. Conclusion(s)/Recommendation(s): There are questions of cardiac amyloidosis, pulmonic insufficiency, and right atrial mass. Cardiac MRI may be reasonable to address these questions. FINDINGS  Left Ventricle: Left ventricular ejection fraction, by estimation, is 40%. The left ventricle has moderately decreased function. The left ventricle demonstrates global hypokinesis. The left ventricular internal cavity size was normal in size. There is severe asymmetric left ventricular hypertrophy. Left ventricular diastolic parameters are indeterminate. Elevated left atrial pressure. Right Ventricle: The right ventricular size is normal. Moderately increased right ventricular wall thickness. Right ventricular systolic function is normal. Left Atrium: Left atrial size was severely dilated. Right Atrium: Right atrial size was normal in size. Pericardium: There is no evidence of pericardial effusion. Mitral Valve: The mitral valve is normal in structure. Mild mitral annular calcification. No evidence of mitral valve regurgitation. No evidence of mitral valve stenosis. Tricuspid Valve: The tricuspid valve is  normal in structure. Tricuspid valve regurgitation is not demonstrated. No evidence of tricuspid stenosis. Aortic Valve: The aortic valve is tricuspid. Aortic valve regurgitation is not visualized. No aortic stenosis is present. Pulmonic Valve: The pulmonic valve was normal in structure. Pulmonic valve regurgitation is moderate. No evidence of pulmonic stenosis. Aorta: The aortic root and ascending aorta are structurally normal, with no evidence of dilitation. IAS/Shunts: No atrial level shunt detected by color flow Doppler.  LEFT VENTRICLE PLAX 2D LVIDd:  4.30 cm   Diastology LVIDs:         3.60 cm   LV e' medial:    3.81 cm/s LV PW:         1.50 cm   LV E/e' medial:  24.6 LV IVS:        1.80 cm   LV e' lateral:   7.72 cm/s LVOT diam:     2.30 cm   LV E/e' lateral: 12.2 LV SV:         72 LV SV Index:   35 LVOT Area:     4.15 cm  RIGHT VENTRICLE            IVC RV Basal diam:  3.60 cm    IVC diam: 2.10 cm RV S prime:     8.51 cm/s TAPSE (M-mode): 1.9 cm LEFT ATRIUM              Index        RIGHT ATRIUM           Index LA diam:        4.80 cm  2.36 cm/m   RA Area:     21.30 cm LA Vol (A2C):   130.0 ml 63.95 ml/m  RA Volume:   66.30 ml  32.61 ml/m LA Vol (A4C):   87.2 ml  42.89 ml/m LA Biplane Vol: 109.0 ml 53.62 ml/m  AORTIC VALVE LVOT Vmax:   97.90 cm/s LVOT Vmean:  62.600 cm/s LVOT VTI:    0.173 m  AORTA Ao Root diam: 3.90 cm Ao Asc diam:  3.80 cm MITRAL VALVE MV Area (PHT): 4.39 cm    SHUNTS MV Decel Time: 173 msec    Systemic VTI:  0.17 m MV E velocity: 93.90 cm/s  Systemic Diam: 2.30 cm MV A velocity: 30.00 cm/s MV E/A ratio:  3.13 Rudean Haskell MD Electronically signed by Rudean Haskell MD Signature Date/Time: 01/26/2022/1:24:03 PM    Final     Cardiac Studies    Echo 01/26/22: 1. Left ventricular ejection fraction, by estimation, is 40%. The left  ventricle has moderately decreased function. The left ventricle  demonstrates global hypokinesis. There is severe asymmetric left   ventricular hypertrophy. Left ventricular diastolic  parameters are indeterminate. Elevated left atrial pressure.   2. There is severe atrial enlargement, and severe LV hypertrophy (1.9 cm  septum with speckled patter) and RV hypertrophy. Strain imaging shows  apical sparing. 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   3. Right ventricular systolic function is normal. The right ventricular  size is normal. Moderately increased right ventricular wall thickness.   4. Left atrial size was severely dilated.   5. The mitral valve is normal in structure. No evidence of mitral valve  regurgitation. No evidence of mitral stenosis.   6. The aortic valve is tricuspid. Aortic valve regurgitation is not  visualized. No aortic stenosis is present.   7. Pulmonic valve regurgitation is moderate.In some view, there is  evidence of an echodensity in the right atrium. This could be related to  compression of the right atrium by an non cardiac structure.   Conclusion(s)/Recommendation(s): There are questions of cardiac  amyloidosis, pulmonic insufficiency, and right atrial mass. Cardiac MRI  may be reasonable to address these questions.   Patient Profile     81 y.o. male with metastatic prostate cancer, on chemotherapy; HTN, PAD s/p L BKA and HLD history of prior stroke, history of baseline dementia and profound weight loss  Assessment & Plan  Acute combined heart failure: Echo 01/26/2022 shows EF 40%.  Severe LVH.  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  Could represent demand ischemia in setting  of his acute illness.  He is not a good candidate for invasive evaluation given his metastatic cancer, frailty, dementia  UTI: Likely cause of encephalopathy.  On Rocephin  For questions or updates, please contact [place] Please consult [email or web address] for contact info under        Signed, Donato Heinz, MD  01/28/2022, 9:48 AM

## 2022-01-29 DIAGNOSIS — E854 Organ-limited amyloidosis: Secondary | ICD-10-CM | POA: Diagnosis not present

## 2022-01-29 DIAGNOSIS — I43 Cardiomyopathy in diseases classified elsewhere: Secondary | ICD-10-CM

## 2022-01-29 DIAGNOSIS — G9340 Encephalopathy, unspecified: Secondary | ICD-10-CM | POA: Diagnosis not present

## 2022-01-29 DIAGNOSIS — I5041 Acute combined systolic (congestive) and diastolic (congestive) heart failure: Secondary | ICD-10-CM | POA: Diagnosis not present

## 2022-01-29 LAB — IRON AND TIBC
Iron: 20 ug/dL — ABNORMAL LOW (ref 45–182)
Saturation Ratios: 12 % — ABNORMAL LOW (ref 17.9–39.5)
TIBC: 169 ug/dL — ABNORMAL LOW (ref 250–450)
UIBC: 149 ug/dL

## 2022-01-29 LAB — BASIC METABOLIC PANEL
Anion gap: 8 (ref 5–15)
BUN: 10 mg/dL (ref 8–23)
CO2: 25 mmol/L (ref 22–32)
Calcium: 8.6 mg/dL — ABNORMAL LOW (ref 8.9–10.3)
Chloride: 105 mmol/L (ref 98–111)
Creatinine, Ser: 0.81 mg/dL (ref 0.61–1.24)
GFR, Estimated: >60 mL/min (ref >60)
Glucose, Bld: 89 mg/dL (ref 70–99)
Potassium: 3.4 mmol/L — ABNORMAL LOW (ref 3.5–5.1)
Sodium: 138 mmol/L (ref 135–145)

## 2022-01-29 LAB — CBC
HCT: 24.5 % — ABNORMAL LOW (ref 39.0–52.0)
Hemoglobin: 7.9 g/dL — ABNORMAL LOW (ref 13.0–17.0)
MCH: 26.8 pg (ref 26.0–34.0)
MCHC: 32.2 g/dL (ref 30.0–36.0)
MCV: 83.1 fL (ref 80.0–100.0)
Platelets: 272 10*3/uL (ref 150–400)
RBC: 2.95 MIL/uL — ABNORMAL LOW (ref 4.22–5.81)
RDW: 17.1 % — ABNORMAL HIGH (ref 11.5–15.5)
WBC: 5.3 10*3/uL (ref 4.0–10.5)
nRBC: 0 % (ref 0.0–0.2)

## 2022-01-29 LAB — RETICULOCYTES
Immature Retic Fract: 11.4 % (ref 2.3–15.9)
RBC.: 2.83 MIL/uL — ABNORMAL LOW (ref 4.22–5.81)
Retic Count, Absolute: 26.6 10*3/uL (ref 19.0–186.0)
Retic Ct Pct: 0.9 % (ref 0.4–3.1)

## 2022-01-29 LAB — VITAMIN B12: Vitamin B-12: 339 pg/mL (ref 180–914)

## 2022-01-29 LAB — KAPPA/LAMBDA LIGHT CHAINS
Kappa free light chain: 66.8 mg/L — ABNORMAL HIGH (ref 3.3–19.4)
Kappa, lambda light chain ratio: 1.31 (ref 0.26–1.65)
Lambda free light chains: 51.1 mg/L — ABNORMAL HIGH (ref 5.7–26.3)

## 2022-01-29 LAB — MRSA NEXT GEN BY PCR, NASAL: MRSA by PCR Next Gen: NOT DETECTED

## 2022-01-29 LAB — FERRITIN: Ferritin: 1663 ng/mL — ABNORMAL HIGH (ref 24–336)

## 2022-01-29 LAB — FOLATE: Folate: 10.6 ng/mL (ref >5.9)

## 2022-01-29 NOTE — Consult Note (Signed)
   [facility] Inpatient Consult   01/29/2022  Kamarius Buckbee 1940/12/21 [id number]  [place] [facility] [[facility]] Patient: [employer]  *showing as 'pending' for [place] [facility]  Primary Care Provider:  [name], MD, [facility] at [facility]  Patient is being followed by [facility] for care needs. Pharmacy is also with [facility].  Plan: Will sign off  Natividad Brood, RN BSN [facility] Liaison  [phone] business mobile phone Toll free office [phone]  Fax number: [phone] [email or web address]

## 2022-01-29 NOTE — Care Management Important Message (Signed)
Important Message  Patient Details  Name: [name] MRN: [id number] Date of Birth: 1941-01-20   Medicare Important Message Given:  Yes     Orbie Pyo 01/29/2022, 2:38 PM

## 2022-01-29 NOTE — Progress Notes (Signed)
Physical Therapy Treatment Patient Details Name: [name] MRN: [id number] DOB: 23-Nov-1940 Today's Date: 01/29/2022   History of Present Illness Pt is 81 yo male who presented on 01/25/22 with altered mentation and decreased oral intake. Suspected UTI.  PMH: metastatic prostate cancer, arthritis, glaucoma, HTN, osteomyelitis, PVD, DM2, CVA cerebellum, NSTEMI, gout, R transmetatarsal amputation, L BKA, dementia.    PT Comments    Patient somewhat reluctant to work with therapies as he was hoping to rest for a while. Ultimately agreed to work on getting OOB with encouragement. Patient able to describe which side of bed he gets out on (left) and how to place recliner on his right for lateral scoot transfer from bed to wheelchair. Pt able to complete with minguard assist with tactile cues needed due to unfamiliar environment and very low vision/blindness. RN in to administer meds and pt not wanting to continue with any additional activity.    Recommendations for follow up therapy are one component of a multi-disciplinary discharge planning process, led by the attending physician.  Recommendations may be updated based on patient status, additional functional criteria and insurance authorization.  Follow Up Recommendations  Home health PT     Assistance Recommended at Discharge Frequent or constant Supervision/Assistance  Patient can return home with the following A lot of help with bathing/dressing/bathroom;A little help with walking and/or transfers;Assistance with cooking/housework;Direct supervision/assist for medications management;Direct supervision/assist for financial management;Assist for transportation   Equipment Recommendations  None recommended by PT    Recommendations for Other Services       Precautions / Restrictions Precautions Precautions: Fall Precaution Comments: has special shoe for R foot (not here at hospital), does not regularly don L prosthesis Restrictions Weight  Bearing Restrictions: No Other Position/Activity Restrictions: blindness     Mobility  Bed Mobility Overal bed mobility: Needs Assistance Bed Mobility: Supine to Sit     Supine to sit: Min assist     General bed mobility comments: min assist to scoot pelvis to get Rt foot to touch the floor    Transfers Overall transfer level: Needs assistance Equipment used: None Transfers: Bed to chair/wheelchair/BSC, Sit to/from Stand            Lateral/Scoot Transfers: Min guard General transfer comment: simulated home with recliner on his right (pt reports that's how he does it at home); tactile cues for direction to chair due to blindness    Ambulation/Gait               General Gait Details: unable   Stairs             Wheelchair Mobility    Modified Rankin (Stroke Patients Only)       Balance Overall balance assessment: Needs assistance Sitting-balance support: Feet unsupported, Bilateral upper extremity supported Sitting balance-Leahy Scale: Fair                                      Cognition Arousal/Alertness: Awake/alert Behavior During Therapy: WFL for tasks assessed/performed Overall Cognitive Status: History of cognitive impairments - at baseline                                 General Comments: hx of dementia at baseline        Exercises      General Comments  Pertinent Vitals/Pain Pain Assessment Pain Assessment: No/denies pain    Home Living                          Prior Function            PT Goals (current goals can now be found in the care plan section) Acute Rehab PT Goals Patient Stated Goal: return home Time For Goal Achievement: 02/09/22 Potential to Achieve Goals: Good Progress towards PT goals: Progressing toward goals    Frequency    Min 3X/week      PT Plan Current plan remains appropriate    Co-evaluation              AM-PAC PT "6 Clicks"  Mobility   Outcome Measure  Help needed turning from your back to your side while in a flat bed without using bedrails?: A Little Help needed moving from lying on your back to sitting on the side of a flat bed without using bedrails?: A Little Help needed moving to and from a bed to a chair (including a wheelchair)?: A Little Help needed standing up from a chair using your arms (e.g., wheelchair or bedside chair)?: Total Help needed to walk in hospital room?: Total Help needed climbing 3-5 steps with a railing? : Total 6 Click Score: 12    End of Session Equipment Utilized During Treatment: Gait belt Activity Tolerance: Patient tolerated treatment well Patient left: in chair;with call bell/phone within reach;with chair alarm set Nurse Communication: Mobility status (NT and RN in room during transfer OOB) PT Visit Diagnosis: Muscle weakness (generalized) (M62.81)     Time: 0321-2248 PT Time Calculation (min) (ACUTE ONLY): 20 min  Charges:  $Therapeutic Activity: 8-22 mins                      Arby Barrette, PT Acute Rehabilitation Services  Office [phone]    Rexanne Mano 01/29/2022, 11:44 AM

## 2022-01-29 NOTE — Progress Notes (Signed)
Progress Note  Patient Name: [name] Date of Encounter: 01/29/2022  [facility] Cardiologist: Candee Furbish, MD   Subjective   BP 101/62 this morning.  Denies any chest pain or dyspnea.  Oriented x2  Inpatient Medications    Scheduled Meds:  atorvastatin  80 mg Oral Daily   brimonidine  1 drop Right Eye BID   Chlorhexidine Gluconate Cloth  6 each Topical Q0600   clopidogrel  75 mg Oral Daily   cycloSPORINE  1 drop Both Eyes BID   dorzolamide-timolol  1 drop Right Eye BID   enoxaparin (LOVENOX) injection  40 mg Subcutaneous Q24H   feeding supplement  237 mL Oral TID BM   mupirocin ointment  1 Application Nasal BID   Continuous Infusions:  cefTRIAXone (ROCEPHIN)  IV 1 g (01/28/22 1823)   PRN Meds: hydroxypropyl methylcellulose / hypromellose, ondansetron **OR** ondansetron (ZOFRAN) IV   Vital Signs    Vitals:   01/28/22 0540 01/28/22 1637 01/28/22 2035 01/29/22 0853  BP: (!) 103/57 122/64 106/60 101/62  Pulse: 72 73 78 67  Resp: '16 18 18 19  '$ Temp: 99.5 F (37.5 C) 98.3 F (36.8 C) 98.8 F (37.1 C) 98.6 F (37 C)  TempSrc: Oral Oral Oral Oral  SpO2: 99% 100% 100% 100%  Weight: 67.3 kg       Intake/Output Summary (Last 24 hours) at 01/29/2022 1033 Last data filed at 01/29/2022 0128 Gross per 24 hour  Intake 460 ml  Output 801 ml  Net -341 ml       01/28/2022    5:40 AM 01/26/2022    6:46 PM  Last 3 Weights  Weight (lbs) 148 lb 5.9 oz 159 lb  Weight (kg) 67.3 kg 72.122 kg      Telemetry    NSR - Personally Reviewed  ECG    No new ECG - Personally Reviewed  Physical Exam   GEN: Chronically ill appearing Neck: No JVD Cardiac: RRR, no murmurs, rubs, or gallops.  Respiratory: Clear to auscultation bilaterally. GI: Soft, nontender, non-distended  MS: No edema Neuro:  Oriented x2 Psych: Normal affect   Labs    High Sensitivity Troponin:   Recent Labs  Lab 01/25/22 1220 01/25/22 1446 01/26/22 0756  TROPONINIHS 139* 160* 207*       Chemistry Recent Labs  Lab 01/25/22 1220 01/26/22 0756 01/29/22 0502  NA 138 138 138  K 3.8 3.6 3.4*  CL 104 107 105  CO2 '24 24 25  '$ GLUCOSE 101* 86 89  BUN '16 15 10  '$ CREATININE 1.04 1.01 0.81  CALCIUM 9.1 8.4* 8.6*  PROT 8.0 6.7  --   ALBUMIN 3.4* 2.7*  --   AST 19 16  --   ALT 10 8  --   ALKPHOS 149* 101  --   BILITOT 0.7 0.5  --   GFRNONAA >60 >60 >60  ANIONGAP '10 7 8     '$ Lipids No results for input(s): "CHOL", "TRIG", "HDL", "LABVLDL", "LDLCALC", "CHOLHDL" in the last 168 hours.  Hematology Recent Labs  Lab 01/25/22 1220 01/26/22 0756 01/29/22 0502  WBC 9.2 8.4 5.3  RBC 3.37* 2.71* 2.95*  2.83*  HGB 9.1* 7.4* 7.9*  HCT 28.8* 22.7* 24.5*  MCV 85.5 83.8 83.1  MCH 27.0 27.3 26.8  MCHC 31.6 32.6 32.2  RDW 17.2* 17.3* 17.1*  PLT 298 240 272    Thyroid No results for input(s): "TSH", "FREET4" in the last 168 hours.  BNPNo results for input(s): "BNP", "PROBNP" in the  last 168 hours.  DDimer No results for input(s): "DDIMER" in the last 168 hours.   Radiology    MR CARDIAC VELOCITY FLOW MAP  Result Date: 01/28/2022 CLINICAL DATA:  81M evalaute for amyloid,?RA mass, and pulmonary insufficiency EXAM: CARDIAC MRI TECHNIQUE: The patient was scanned on a 1.5 Tesla Siemens magnet. A dedicated cardiac coil was used. Functional imaging was done using Fiesta sequences. 2,3, and 4 chamber views were done to assess for RWMA's. Modified Simpson's rule using a short axis stack was used to calculate an ejection fraction on a dedicated work Conservation officer, nature. The patient received 9 cc of Gadavist. After 10 minutes inversion recovery sequences were used to assess for infiltration and scar tissue. Phase contrast velocity mapping was performed. CONTRAST:  9 cc  of Gadavist FINDINGS: Left ventricle: -Mild dilatation -Mild systolic dysfunction -Severe asymmetric hypertrophy measuring up to 4m in basal septum (151min posterior wall) -ECV markedly elevated (70%) -Diffuse LGE  LV EF: 41% (Normal 56-78%) Absolute volumes: LV EDV: 18961mNormal 77-195 mL) LV ESV: 112m44mormal 19-72 mL) LV SV: 77mL79mrmal 51-133 mL) CO: 5.2L/min (Normal 2.8-8.8 L/min) Indexed volumes: LV EDV: 102mL/35m (Normal 47-92 mL/sq-m) LV ESV: 60mL/s76m(Normal 13-30 mL/sq-m) LV SV: 42mL/sq91mNormal 32-62 mL/sq-m) CI: 2.8L/min/sq-m (Normal 1.7-4.2 L/min/sq-m) Right ventricle: Mild dilatation with mild systolic dysfunction RV EF:  39% (Normal 47-74%) Absolute volumes: RV EDV: 211mL (No39m 88-227 mL) RV ESV: 129mL (Nor74m23-103 mL) RV SV: 82mL (Norm20m2-138 mL) CO: 5.5L/min (Normal 2.8-8.8 L/min) Indexed volumes: RV EDV: 113mL/sq-m (50mal 55-105 mL/sq-m) RV ESV: 69mL/sq-m (N76ml 15-43 mL/sq-m) RV SV: 44mL/sq-m (No82m 32-64 mL/sq-m) CI: 2.9L/min/sq-m (Normal 1.7-4.2 L/min/sq-m) Left atrium: Moderate enlargement Right atrium: Mild enlargement.  No right atrial mass seen Mitral valve: Trivial regurgitation Aortic valve: No regurgitation Tricuspid valve: Trivial regurgitation Pulmonic valve: Trivial regurgitation Aorta: Dilated ascending aorta measuring 40mm Pericardi15mNormal IMPRESSION: 1. Findings consistent with cardiac amyloidosis, including diffuse late gadolinium enhancement, severe asymmetric LV hypertrophy, and markedly elevated extracellular volume (70%) 2.  No right atrial mass seen 3.  Mild LV dilatation with mild systolic dysfunction (EF 41%) 4.  Mild R56%ilatation with mild systolic dysfunction (EF 39%) 5.  Dilate21%scending aorta measuring 40mm Electronic76m Signed   By: Lynzi Meulemans  SchOswaldo Milian79/2023 17:07   MR CARDIAC MORPHOLOGY W WO CONTRAST  Result Date: 01/28/2022 CLINICAL DATA:  81M evalaute for amyloid,?RA mass, and pulmonary insufficiency EXAM: CARDIAC MRI TECHNIQUE: The patient was scanned on a 1.5 Tesla Siemens magnet. A dedicated cardiac coil was used. Functional imaging was done using Fiesta sequences. 2,3, and 4 chamber views were done to assess for RWMA's. Modified  Simpson's rule using a short axis stack was used to calculate an ejection fraction on a dedicated work station using CiConservation officer, natureceived 9 cc of Gadavist. After 10 minutes inversion recovery sequences were used to assess for infiltration and scar tissue. Phase contrast velocity mapping was performed. CONTRAST:  9 cc  of Gadavist FINDINGS: Left ventricle: -Mild dilatation -Mild systolic dysfunction -Severe asymmetric hypertrophy measuring up to 20mm in basal se75m (11mm in posterior75ml) -ECV markedly elevated (70%) -Diffuse LGE LV EF: 41% (Normal 56-78%) Absolute volumes: LV EDV: 189mL (Normal 77-1968m) LV ESV: 112mL (Normal 19-72 53mLV SV: 77mL (Normal 51-133 13mCO: 5.2L/min (Normal 2.8-8.8 L/min) Indexed volumes: LV EDV: 102mL/sq-m (Normal 47-17mL/sq-m) LV ESV: 60mL/sq-m (Normal 13-336m/sq-m) LV SV: 42mL/sq-m (Normal 32-6241msq-m) CI: 2.8L/min/sq-m (Normal 1.7-4.2 L/min/sq-m) Right ventricle:  Mild dilatation with mild systolic dysfunction RV EF:  39% (Normal 47-74%) Absolute volumes: RV EDV: 253m (Normal 88-227 mL) RV ESV: 1284m(Normal 23-103 mL) RV SV: 8256mNormal 52-138 mL) CO: 5.5L/min (Normal 2.8-8.8 L/min) Indexed volumes: RV EDV: 113m71m-m (Normal 55-105 mL/sq-m) RV ESV: 69mL66mm (Normal 15-43 mL/sq-m) RV SV: 44mL/3m (Normal 32-64 mL/sq-m) CI: 2.9L/min/sq-m (Normal 1.7-4.2 L/min/sq-m) Left atrium: Moderate enlargement Right atrium: Mild enlargement.  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 Findings consistent with cardiac amyloidosis, including diffuse late gadolinium enhancement, severe asymmetric LV hypertrophy, and markedly elevated extracellular volume (70%) 2.  No right atrial mass seen 3.  Mild LV dilatation with mild systolic dysfunction (EF 41%) 4.36%ild RV  dilatation with mild systolic dysfunction (EF 39%) 5.14%ilated ascending aorta measuring 40mm El47monically Signed   By: ChristopOswaldo MilianOn: 01/28/2022 17:04    Cardiac Studies    Echo 01/26/22: 1. Left ventricular ejection fraction, by estimation, is 40%. The left  ventricle has moderately decreased function. The left ventricle  demonstrates global hypokinesis. There is severe asymmetric left  ventricular hypertrophy. Left ventricular diastolic  parameters are indeterminate. Elevated left atrial pressure.   2. There is severe atrial enlargement, and severe LV hypertrophy (1.9 cm  septum with speckled patter) and RV hypertrophy. Strain imaging shows  apical sparing. 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   3. Right ventricular systolic function is normal. The right ventricular  size is normal. Moderately increased right ventricular wall thickness.   4. Left atrial size was severely dilated.   5. The mitral valve is normal in structure. No evidence of mitral valve  regurgitation. No evidence of mitral stenosis.   6. The aortic valve is tricuspid. Aortic valve regurgitation is not  visualized. No aortic stenosis is present.   7. Pulmonic valve regurgitation is moderate.In some view, there is  evidence of an echodensity in the right atrium. This could be related to  compression of the right atrium by an non cardiac structure.   Conclusion(s)/Recommendation(s): There are questions of cardiac  amyloidosis, pulmonic insufficiency, and right atrial mass. Cardiac MRI  may be reasonable to address these questions.   Patient Profile     81 y.o. 8le with metastatic prostate cancer, on chemotherapy; HTN, PAD s/p L BKA and HLD history of prior stroke, history of baseline dementia and profound weight loss  Assessment & Plan    Acute combined heart failure/cardiac amyloidosis: Echo 01/26/2022 shows EF 40%.  Severe LVH.  Cardiac MRI consistent with cardiac amyloidosis -GDMT limited by  blood pressure and niece reports concompliance with meds; UTI (defer SGLT2i) -SPEP/light chains pending to evaluate for AL amyloidosis -Unfortunately with his metastatic cancer currently on palliative therapy and dementia  likely not a good candidate for treatment of his cardiac amyloidosis.  Recommend palliative evaluation  PAD with prior TMA and BKA - continue plavix and statin, presently asymptomatic  Elevated troponin: 139 > 160 > 207.  Could represent demand ischemia in setting of his acute illness.  He is not a good candidate for invasive evaluation given his metastatic cancer, frailty, dementia  UTI: Likely cause of encephalopathy.  On Rocephin   For questions or updates, please contact [facility] [email or web address] for contact info under        Signed, ChristopDonato Heinz15/2023, 10:33 AM

## 2022-01-29 NOTE — Progress Notes (Signed)
Progress Note    [name]   AJO:[id number]  DOB: 1941-02-04  DOA: 01/25/2022     3 PCP: [name], MD  Initial CC: AMS  Hospital Course: [name] is a 81 yo male with metastatic prostate cancer on chemotherapy, mild dementia, hypertension, chronic anemia, peripheral vascular disease with left BKA, extremely poor vision, hx CVA who lives alone and has family members to check in periodically brought to the ED after he was found to be more confused than usual with decreased oral intake and lower abdominal discomfort. He was presumed to have a UTI and was started on treatment. He also had an elevated troponin and underwent further work-up.  Echo was notable for reduced EF, 40% with severe asymmetric left ventricular hypertrophy and LV global hypokinesis.  Cardiology was consulted for further assistance. A cardiac MRI was ordered which showed severe asymmetric LV hypertrophy and findings consistent with cardiac amyloidosis. He was previously referred to palliative care outpatient but had not yet established.  Due to his multiple comorbidities and poor functional status with new findings of cardiac amyloidosis, palliative care was consulted inpatient for assistance with [place] discussions.  Interval History:  No events overnight.  Sitting up in chair comfortably when seen this morning.   Assessment and Plan:  UTI Acute metabolic encephalopathy -UA was cloudy with large leukocytes, greater than 50 WBCs -Started on IV ceftriaxone, unfortunately urine cultures were not sent so completing empiric 5 day course -Monitor for urinary retention -Mental status improved, close to baseline -PT OT eval: Home Health   Elevated troponins New systolic CHF Cardiac amyloidosis  -Echo with EF of 40%, severe asymmetric LVH, severe atrial enlargement, speckled septum - cardiac MRI confirms consistency with cardiac amyloid (poor treatment candidate in general per cardiology as well as for  ischemic workup) - given amyloid diagnosis, will at least check SPEP/UPEP and K/L light chains - palliative care consulted given ongoing functional decline with his underlying comorbidities and now new diagnosis of worsening heart function   Metastatic prostate cancer -With bone mets and adenopathy, followed by Dr. Alen Blew -History of poor compliance with Gillermina Phy -On Eligard for androgen deprivation every few months - agree with appropriateness of palliative care eval and possible focus on comfort    Acute on chronic anemia -Worsened since admission, likely secondary to hemodilution - iron stores low; Suspect baseline anemia is secondary to chronic disease, malignancy   Moderate protein calorie malnutrition - RD consulted, see note.    PVD, left BKA Right transmetatarsal amputation Continue statin and Plavix   Suspected dementia, cognitive deficits - PT OT eval -Palliative care consult   HTN -BP stable, antihypertensives on hold, discontinue IV fluids   DM2 - SSI, glucose checks, DM diet   Old records reviewed in assessment of this patient  Antimicrobials: Rocephin  DVT prophylaxis:  enoxaparin (LOVENOX) injection 40 mg Start: 01/26/22 1000   Code Status:   Code Status: Full Code  Mobility Assessment (last 72 hours)     Mobility Assessment     Row Name 01/29/22 1244 01/29/22 1139 01/29/22 1112 01/28/22 1339 01/27/22 0900   Does patient have an order for bedrest or is patient medically unstable -- -- No - Continue assessment No - Continue assessment --   What is the highest level of mobility based on the progressive mobility assessment? Level 2 (Chairfast) - Balance while sitting on edge of bed and cannot stand Level 2 (Chairfast) - Balance while sitting on edge of bed and cannot stand Level  2 (Chairfast) - Balance while sitting on edge of bed and cannot stand Level 2 (Chairfast) - Balance while sitting on edge of bed and cannot stand Level 2 (Chairfast) - Balance while  sitting on edge of bed and cannot stand   Is the above level different from baseline mobility prior to current illness? -- -- Yes - Recommend PT order -- --    Row Name 01/27/22 0814 01/26/22 2100         Does patient have an order for bedrest or is patient medically unstable No - Continue assessment No - Continue assessment      What is the highest level of mobility based on the progressive mobility assessment? Level 2 (Chairfast) - Balance while sitting on edge of bed and cannot stand Level 2 (Chairfast) - Balance while sitting on edge of bed and cannot stand      Is the above level different from baseline mobility prior to current illness? Yes - Recommend PT order Yes - Recommend PT order               Barriers to discharge:  Disposition Plan:  Pending palliative care discussions Status is: Inpt  Objective: Blood pressure 102/66, pulse 65, temperature 97.8 F (36.6 C), resp. rate 18, weight 67.3 kg, SpO2 98 %.  Examination:  Physical Exam Constitutional:      General: He is not in acute distress.    Comments: Chronically ill appearing; slowed mentation   HENT:     Head: Normocephalic and atraumatic.     Mouth/Throat:     Mouth: Mucous membranes are moist.  Eyes:     Extraocular Movements: Extraocular movements intact.     Comments: Extremely poor gross vision   Cardiovascular:     Rate and Rhythm: Normal rate and regular rhythm.  Pulmonary:     Effort: Pulmonary effort is normal. No respiratory distress.     Breath sounds: Normal breath sounds. No wheezing.  Abdominal:     General: Bowel sounds are normal. There is no distension.     Palpations: Abdomen is soft.     Tenderness: There is no abdominal tenderness.  Musculoskeletal:        General: Normal range of motion.     Cervical back: Normal range of motion and neck supple.  Skin:    General: Skin is warm and dry.  Neurological:     Mental Status: He is alert. Mental status is at baseline.  Psychiatric:         Mood and Affect: Mood normal.      Consultants:  Palliative care Cardiology  Procedures:    Data Reviewed: Results for orders placed or performed during the hospital encounter of 01/25/22 (from the past 24 hour(s))  CBC     Status: Abnormal   Collection Time: 01/29/22  5:02 AM  Result Value Ref Range   WBC 5.3 4.0 - 10.5 K/uL   RBC 2.95 (L) 4.22 - 5.81 MIL/uL   Hemoglobin 7.9 (L) 13.0 - 17.0 g/dL   HCT 24.5 (L) 39.0 - 52.0 %   MCV 83.1 80.0 - 100.0 fL   MCH 26.8 26.0 - 34.0 pg   MCHC 32.2 30.0 - 36.0 g/dL   RDW 17.1 (H) 11.5 - 15.5 %   Platelets 272 150 - 400 K/uL   nRBC 0.0 0.0 - 0.2 %  Basic metabolic panel     Status: Abnormal   Collection Time: 01/29/22  5:02 AM  Result Value Ref Range  Sodium 138 135 - 145 mmol/L   Potassium 3.4 (L) 3.5 - 5.1 mmol/L   Chloride 105 98 - 111 mmol/L   CO2 25 22 - 32 mmol/L   Glucose, Bld 89 70 - 99 mg/dL   BUN 10 8 - 23 mg/dL   Creatinine, Ser 0.81 0.61 - 1.24 mg/dL   Calcium 8.6 (L) 8.9 - 10.3 mg/dL   GFR, Estimated >60 >60 mL/min   Anion gap 8 5 - 15  Vitamin B12     Status: None   Collection Time: 01/29/22  5:02 AM  Result Value Ref Range   Vitamin B-12 339 180 - 914 pg/mL  Folate     Status: None   Collection Time: 01/29/22  5:02 AM  Result Value Ref Range   Folate 10.6 >5.9 ng/mL  Iron and TIBC     Status: Abnormal   Collection Time: 01/29/22  5:02 AM  Result Value Ref Range   Iron 20 (L) 45 - 182 ug/dL   TIBC 169 (L) 250 - 450 ug/dL   Saturation Ratios 12 (L) 17.9 - 39.5 %   UIBC 149 ug/dL  Ferritin     Status: Abnormal   Collection Time: 01/29/22  5:02 AM  Result Value Ref Range   Ferritin 1,663 (H) 24 - 336 ng/mL  Reticulocytes     Status: Abnormal   Collection Time: 01/29/22  5:02 AM  Result Value Ref Range   Retic Ct Pct 0.9 0.4 - 3.1 %   RBC. 2.83 (L) 4.22 - 5.81 MIL/uL   Retic Count, Absolute 26.6 19.0 - 186.0 K/uL   Immature Retic Fract 11.4 2.3 - 15.9 %  MRSA Next Gen by PCR, Nasal     Status: None    Collection Time: 01/29/22 11:06 AM   Specimen: Nasal Mucosa; Nasal Swab  Result Value Ref Range   MRSA by PCR Next Gen NOT DETECTED NOT DETECTED    I have Reviewed nursing notes, Vitals, and Lab results since pt's last encounter. Pertinent lab results : see above I have ordered test including BMP, CBC, Mg I have reviewed the last note from staff over past 24 hours I have discussed pt's care plan and test results with nursing staff, case manager   LOS: 3 days   Dwyane Dee, MD [facility] 01/29/2022, 4:22 PM

## 2022-01-29 NOTE — Progress Notes (Signed)
Occupational Therapy Treatment and Discharge Patient Details Name: [name] MRN: [id number] DOB: 10-21-1940 Today's Date: 01/29/2022   History of present illness Pt is 81 yo male who presented on 01/25/22 with altered mentation and decreased oral intake. Suspected UTI.  PMH: metastatic prostate cancer, arthritis, glaucoma, HTN, osteomyelitis, PVD, DM2, CVA cerebellum, NSTEMI, gout, R transmetatarsal amputation, L BKA, dementia.   OT comments  Pt in bed upon therapy arrival and agreeable to participate in treatment session although reports that he would like to rest. Pt able to provide information on his home environment set up and how he completes BADL tasks at Mod I level. Due to his blindness and being in a new environment, pt does require slightly more assistance than typically although able to demonstrate a safe and functional lateral scoot transfer to recliner. Provided VC and tactile cues for direction as well as use of blue hospital goal on recliner seat for color contrast. At this time, pt no longer benefit from acute OT needs as he is limited with his visual impairments and being in a new/unfamiliar environment. Pt will be discharge from acute OT services although will continue to benefit from [facility] OT to assess home environment and safety with ADL completion.    Recommendations for follow up therapy are one component of a multi-disciplinary discharge planning process, led by the attending physician.  Recommendations may be updated based on patient status, additional functional criteria and insurance authorization.    Follow Up Recommendations  Home health OT    Assistance Recommended at Discharge Frequent or constant Supervision/Assistance  Patient can return home with the following  Direct supervision/assist for medications management;Direct supervision/assist for financial management;Assist for transportation;Help with stairs or ramp for entrance;Assistance with cooking/housework;A  little help with walking and/or transfers;A little help with bathing/dressing/bathroom   Equipment Recommendations  None recommended by OT       Precautions / Restrictions Precautions Precautions: Fall;Other (comment) Precaution Comments: has special shoe for R foot (not here at hospital), does not regularly donn L prosthesis, 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 contrasting colors) Restrictions Weight Bearing Restrictions: No Other Position/Activity Restrictions: blindness       Mobility Bed Mobility Overal bed mobility: Needs Assistance Bed Mobility: Supine to Sit     Supine to sit: Min assist       Patient Response: Cooperative  Transfers Overall transfer level: Needs assistance Equipment used: None Transfers: Bed to chair/wheelchair/BSC            Lateral/Scoot Transfers: Min guard General transfer comment: simulated home with recliner on his right (pt reports that's how he does it at home); tactile cues for direction to chair due to blindness     Balance Overall balance assessment: No apparent balance deficits (not formally assessed)       ADL either performed or assessed with clinical judgement     Vision Baseline Vision/History: 2 Legally blind Ability to See in Adequate Light: 4 Severely impaired Patient Visual Report: No change from baseline            Cognition Arousal/Alertness: Awake/alert Behavior During Therapy: WFL for tasks assessed/performed Overall Cognitive Status: History of cognitive impairments - at baseline       General Comments: hx of dementia at baseline                   Pertinent Vitals/ Pain       Pain Assessment Pain Assessment: No/denies pain      Progress Toward Goals  OT Goals(current goals can now be found in the care plan section)   (Education complete, pt discharge from OT)     Plan Other (comment) (Pt at an adequate level for acute OT discharge.)    Co-evaluation    PT/OT/SLP  Co-Evaluation/Treatment: Yes Reason for Co-Treatment: To address functional/ADL transfers   OT goals addressed during session: ADL's and self-care;Proper use of Adaptive equipment and DME      AM-PAC OT "6 Clicks" Daily Activity     Outcome Measure   Help from another person eating meals?: A Little (for food location due to blindness) Help from another person taking care of personal grooming?: A Little (for supply location due to blindness) Help from another person toileting, which includes using toliet, bedpan, or urinal?: A Little (due to blindness) Help from another person bathing (including washing, rinsing, drying)?: A Little (due to blindness) Help from another person to put on and taking off regular upper body clothing?: A Little (due to blindness) Help from another person to put on and taking off regular lower body clothing?: A Little (due to blindness) 6 Click Score: 18    End of Session Equipment Utilized During Treatment: Gait belt  OT Visit Diagnosis: Unsteadiness on feet (R26.81);Other abnormalities of gait and mobility (R26.89);Muscle weakness (generalized) (M62.81)   Activity Tolerance Patient tolerated treatment well   Patient Left in bed;with call bell/phone within reach;with bed alarm set;with nursing/sitter in room   Nurse Communication Mobility status        Time: 5790-3833 OT Time Calculation (min): 20 min  Charges: OT General Charges $OT Visit: 1 Visit  Ailene Ravel, OTR/L,CBIS  Supplemental OT - [facility] and [facility]   Tishawn Friedhoff, Clarene Duke 01/29/2022, 12:47 PM

## 2022-01-29 NOTE — Hospital Course (Signed)
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 He was presumed to have a UTI and was started on treatment. He also had an elevated troponin and underwent further work-up.  Echo was notable for reduced EF, 40% with severe asymmetric left ventricular hypertrophy and LV global hypokinesis.  Cardiology was consulted for further assistance. A cardiac MRI was ordered which showed severe asymmetric LV hypertrophy and findings consistent with cardiac amyloidosis. He was previously referred to palliative care outpatient but had not yet established.  Due to his multiple comorbidities and poor functional status with new findings of cardiac amyloidosis, palliative care was consulted inpatient for assistance with [place] discussions.

## 2022-01-30 DIAGNOSIS — Z7189 Other specified counseling: Secondary | ICD-10-CM | POA: Diagnosis not present

## 2022-01-30 DIAGNOSIS — R5381 Other malaise: Secondary | ICD-10-CM | POA: Diagnosis not present

## 2022-01-30 DIAGNOSIS — G9340 Encephalopathy, unspecified: Secondary | ICD-10-CM | POA: Diagnosis not present

## 2022-01-30 DIAGNOSIS — N3000 Acute cystitis without hematuria: Secondary | ICD-10-CM

## 2022-01-30 DIAGNOSIS — C61 Malignant neoplasm of prostate: Secondary | ICD-10-CM

## 2022-01-30 DIAGNOSIS — Z515 Encounter for palliative care: Secondary | ICD-10-CM

## 2022-01-30 DIAGNOSIS — I5041 Acute combined systolic (congestive) and diastolic (congestive) heart failure: Secondary | ICD-10-CM | POA: Diagnosis not present

## 2022-01-30 DIAGNOSIS — Z66 Do not resuscitate: Secondary | ICD-10-CM

## 2022-01-30 DIAGNOSIS — C7951 Secondary malignant neoplasm of bone: Secondary | ICD-10-CM

## 2022-01-30 DIAGNOSIS — E854 Organ-limited amyloidosis: Secondary | ICD-10-CM | POA: Diagnosis not present

## 2022-01-30 DIAGNOSIS — E440 Moderate protein-calorie malnutrition: Secondary | ICD-10-CM

## 2022-01-30 LAB — PROTEIN ELECTROPHORESIS, SERUM
A/G Ratio: 0.7 (ref 0.7–1.7)
Albumin ELP: 2.9 g/dL (ref 2.9–4.4)
Alpha-1-Globulin: 0.5 g/dL — ABNORMAL HIGH (ref 0.0–0.4)
Alpha-2-Globulin: 0.9 g/dL (ref 0.4–1.0)
Beta Globulin: 0.9 g/dL (ref 0.7–1.3)
Gamma Globulin: 1.7 g/dL (ref 0.4–1.8)
Globulin, Total: 4 g/dL — ABNORMAL HIGH (ref 2.2–3.9)
Total Protein ELP: 6.9 g/dL (ref 6.0–8.5)

## 2022-01-30 LAB — KAPPA/LAMBDA LIGHT CHAINS
Kappa free light chain: 64 mg/L — ABNORMAL HIGH (ref 3.3–19.4)
Kappa, lambda light chain ratio: 1.29 (ref 0.26–1.65)
Lambda free light chains: 49.7 mg/L — ABNORMAL HIGH (ref 5.7–26.3)

## 2022-01-30 NOTE — Consult Note (Signed)
Consultation Note Date: 01/30/2022   Patient Name: [name]  DOB: 1940-09-17  MRN: [id number]  Age / Sex: 81 y.o., male  PCP: [name], MD Referring Physician: Dwyane Dee, MD  Reason for Consultation: Establishing goals of care  HPI/Patient Profile: 81 y.o. male  with past medical history of prostate cancer on chemotherapy, mild dementia, hypertension, chronic anemia, peripheral vascular disease with left BKA, extremely poor vision, and hx CVA  admitted on 01/25/2022 with UTI and found to have new systolic CHF.  Had cardiac MRI and was diagnosed with cardiac amyloidosis.  On echo found to have EF of 40%.  Per cardiology, patient likely not a good candidate for treatment of cardiac amyloidosis and not a good candidate for invasive evaluation.  PMT consulted to discuss goals of care.  Clinical Assessment and Goals of Care: I have reviewed medical records including EPIC notes, labs and imaging,  assessed the patient and then met with patient to discuss diagnosis prognosis, GOC, EOL wishes, disposition and options.  I introduced Palliative Medicine as specialized medical care for people living with serious illness. It focuses on providing relief from the symptoms and stress of a serious illness. The goal is to improve quality of life for both the patient and the family.  Dementia diagnosis noted and patient somewhat confused throughout conversation but we were able to briefly discuss his care.  He was able to tell me he generally felt well with no shortness of breath or chest pain.  He is unable to tell me about his new diagnosis but when I mention heart problems he agrees he understands this is going on.  He tells me to call [name] to further discuss his medical decisions.  He does share with me that he would not want CPR or to be on a ventilator.  I brought up that he may be a candidate for hospice therapy and he seems to agree with  this.  I later called patient's niece [name].  [name] tells me she is HCPOA and typically helps patient with medical decisions.  As far as functional and nutritional status [name] tells me the patient has had a poor appetite for a while-losing 26 pounds in the last year.  She tells me he spends most of his time sedentary but is able to transfer to the toilet as needed.  She tells me she tries to encourage the patient to get out and be more active but he does not want to.  She tells me he does not take his meds even when they encourage him to do so.  She tells me he has been hospitalized before due to dehydration.  She tells me of confusion at home; for example, always thinks it is Sunday.   We discussed patient's current illness and what it means in the larger context of patient's on-going co-morbidities.  Natural disease trajectory and expectations at EOL were discussed.  We discussed his new diagnosis of cardiac amyloidosis on top of his metastatic prostate cancer.  We reviewed that cardiology does not think he is a good candidate for treatment.  I attempted to elicit values and goals of care important to the patient.  [name] tells me she thinks the patient would want to focus on his comfort and quality of life.  She tells me his most important goal is to be at home.  We discussed CODE STATUS and reviewed patient's statements in line with DNR status.  [name] tells me this is consistent with what he has  told her in the past and she believes DNR status is appropriate as well.  Hospice services outpatient were explained and offered.  [name] is interested in home hospice support -tells me she has had experience with a sore care in the past and would be interested in working with them.  We discussed that patient would likely not be able to continue cancer treatment on hospice and she tells me he did not want to continue it anyway so she is okay with this.  Questions and concerns were addressed. The family was  encouraged to call with questions or concerns.  Primary Decision Maker HCPOA -niece [name]    SUMMARY OF RECOMMENDATIONS   CODE STATUS changed to DNR Hospice support at home upon discharge  Code Status/Advance Care Planning: DNR  Prognosis:  < 6 months  Discharge Planning: Home with Hospice      Primary Diagnoses: Present on Admission:  Acute encephalopathy  Encephalopathy  HTN (hypertension)   I have reviewed the medical record, interviewed the patient and family, and examined the patient. The following aspects are pertinent.  Past Medical History:  Diagnosis Date   Arthritis    Denies   Glaucoma    High cholesterol    Hypertension    denies   Osteomyelitis ([place])    Right great toe   Peripheral vascular disease ([place])    diabetic  with osteomylitis   Pre-diabetes    Stroke (cerebrum) ([place]) 10/2018   Social History   Socioeconomic History   Marital status: Single    Spouse name: Not on file   Number of children: 1   Years of education: 9   Highest education level: 9th grade  Occupational History   Not on file  Tobacco Use   Smoking status: Every Day    Types: Pipe    Passive exposure: Current   Smokeless tobacco: Never   Tobacco comments:    Uses 2-3 barrels a day.  Vaping Use   Vaping Use: Never used  Substance and Sexual Activity   Alcohol use: No    Alcohol/week: 0.0 standard drinks of alcohol   Drug use: No   Sexual activity: Not Currently  Other Topics Concern   Not on file  Social History Narrative   Lives alone in an apartment on the first floor.  Has one daughter.  Retired Games developer.  Education: 12th grade.    Social Determinants of Health   Financial Resource Strain: Low Risk  (05/21/2021)   Overall Financial Resource Strain (CARDIA)    Difficulty of Paying Living Expenses: Not very hard  Food Insecurity: No Food Insecurity (05/21/2021)   Hunger Vital Sign    Worried About Running Out of Food in the Last Year: Never true     Ran Out of Food in the Last Year: Never true  Transportation Needs: No Transportation Needs (05/21/2021)   PRAPARE - Hydrologist (Medical): No    Lack of Transportation (Non-Medical): No  Physical Activity: Inactive (05/21/2021)   Exercise Vital Sign    Days of Exercise per Week: 0 days    Minutes of Exercise per Session: 0 min  Stress: No Stress Concern Present (05/21/2021)   [place]    Feeling of Stress : Only a little  Social Connections: Unknown (05/21/2021)   Social Connection and Isolation Panel [NHANES]    Frequency of Communication with Friends and Family: More than three times a week  Frequency of Social Gatherings with Friends and Family: More than three times a week    Attends Religious Services: 1 to 4 times per year    Active Member of [employer] or Organizations: No    Attends Music therapist: Never    Marital Status: Not on file   Family History  Problem Relation Age of Onset   Diabetes Mellitus II Other    Anesthesia problems Neg Hx    Scheduled Meds:  atorvastatin  80 mg Oral Daily   brimonidine  1 drop Right Eye BID   Chlorhexidine Gluconate Cloth  6 each Topical Q0600   clopidogrel  75 mg Oral Daily   cycloSPORINE  1 drop Both Eyes BID   dorzolamide-timolol  1 drop Right Eye BID   enoxaparin (LOVENOX) injection  40 mg Subcutaneous Q24H   feeding supplement  237 mL Oral TID BM   mupirocin ointment  1 Application Nasal BID   Continuous Infusions:  cefTRIAXone (ROCEPHIN)  IV 1 g (01/29/22 1849)   PRN Meds:.hydroxypropyl methylcellulose / hypromellose, ondansetron **OR** ondansetron (ZOFRAN) IV No Known Allergies Review of Systems  Constitutional:  Positive for activity change, appetite change and fatigue.  Respiratory:  Negative for shortness of breath.   Cardiovascular:  Negative for chest pain.  Neurological:  Positive for weakness.    Physical  Exam Constitutional:      General: He is not in acute distress.    Appearance: He is ill-appearing.  Pulmonary:     Effort: Pulmonary effort is normal.  Skin:    General: Skin is warm and dry.  Neurological:     Mental Status: He is alert. He is disoriented.     Vital Signs: BP 108/64 (BP Location: Left Arm)   Pulse 66   Temp 98.2 F (36.8 C)   Resp 17   Wt 67.3 kg   SpO2 100%   BMI 19.57 kg/m  Pain Scale: 0-10   Pain Score: 0-No pain   SpO2: SpO2: 100 % O2 Device:SpO2: 100 % O2 Flow Rate: .   IO: Intake/output summary:  Intake/Output Summary (Last 24 hours) at 01/30/2022 1042 Last data filed at 01/30/2022 1012 Gross per 24 hour  Intake 2791.29 ml  Output 1800 ml  Net 991.29 ml    LBM: Last BM Date : 01/29/22 Baseline Weight: Weight: 72.1 kg Most recent weight: Weight: 67.3 kg     Palliative Assessment/Data:PPS 40%     *Please note that this is a verbal dictation therefore any spelling or grammatical errors are due to the "[place]" system interpretation.   Juel Burrow, DNP, AGNP-C Palliative Medicine Team [phone] Pager: [phone]

## 2022-01-30 NOTE — Progress Notes (Signed)
[place] [facility]) Hospital Liaison Note   Received request from Charlotte Tom-Johnson, [facility], for hospice services at home after discharge. Spoke with [name], patient's niece, to initiate education related to hospice philosophy, services, and team approach to care. Family verbalized understanding of information given. Per discussion, the plan is for discharge home today (8.16.23) via private vehicle.   DME needs discussed. Patient has the following equipment in the home: Shower chair  No other DME needs at this time.   The address has been verified and is correct in the chart.   [name] (Niece) is the family contact, her number is [phone]  Please send signed and completed DNR home with patient/family.   Please provide prescriptions at discharge as needed to ensure ongoing symptom management.   ACC information and contact numbers given to [name], patient's niece.   Above information shared with Marcella Tom-Johnson, TOC.  Please call with any questions or concerns  Thank you for the opportunity to participate in this patient's care.  [place]  [facility] liaison [phone]

## 2022-01-30 NOTE — Progress Notes (Signed)
Physical Therapy Treatment Patient Details Name: [name] MRN: [id number] DOB: 1941/03/08 Today's Date: 01/30/2022   History of Present Illness Pt is 81 yo male who presented on 01/25/22 with altered mentation and decreased oral intake. Suspected UTI.  PMH: metastatic prostate cancer, arthritis, glaucoma, HTN, osteomyelitis, PVD, DM2, CVA cerebellum, NSTEMI, gout, R transmetatarsal amputation, L BKA, dementia.    PT Comments    Patient eager to get OOB to chair. Able to perform bed mobility and transfer with supervision (with cues for location of chair due to decr vision). Once in chair, pt received call from his niece and is trying to arrange a ride home as he now states he is being discharged today.     Recommendations for follow up therapy are one component of a multi-disciplinary discharge planning process, led by the attending physician.  Recommendations may be updated based on patient status, additional functional criteria and insurance authorization.  Follow Up Recommendations  Home health PT     Assistance Recommended at Discharge Intermittent Supervision/Assistance  Patient can return home with the following A lot of help with bathing/dressing/bathroom;Assistance with cooking/housework;Direct supervision/assist for medications management;Direct supervision/assist for financial management;Assist for transportation   Equipment Recommendations  None recommended by PT    Recommendations for Other Services OT consult     Precautions / Restrictions Precautions Precautions: Fall;Other (comment) Precaution Comments: has special shoe for R foot (not here at hospital), does not regularly donn L prosthesis, 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 contrasting colors) Restrictions Weight Bearing Restrictions: No Other Position/Activity Restrictions: blindness     Mobility  Bed Mobility Overal bed mobility: Needs Assistance Bed Mobility: Supine to Sit     Supine to sit:  Supervision     General bed mobility comments: no assist needed    Transfers Overall transfer level: Needs assistance Equipment used: None Transfers: Bed to chair/wheelchair/BSC            Lateral/Scoot Transfers: Supervision General transfer comment: simulated home with recliner on his right (pt reports that's how he does it at home);  cues for direction to chair due to blindness    Ambulation/Gait               General Gait Details: unable   Stairs             Wheelchair Mobility    Modified Rankin (Stroke Patients Only)       Balance Overall balance assessment: No apparent balance deficits (not formally assessed) Sitting-balance support: Feet unsupported, Bilateral upper extremity supported Sitting balance-Leahy Scale: Fair                                      Cognition Arousal/Alertness: Awake/alert Behavior During Therapy: WFL for tasks assessed/performed Overall Cognitive Status: History of cognitive impairments - at baseline                                 General Comments: hx of dementia at baseline        Exercises      General Comments        Pertinent Vitals/Pain Pain Assessment Pain Assessment: No/denies pain    Home Living                          Prior Function  PT Goals (current goals can now be found in the care plan section) Acute Rehab PT Goals Patient Stated Goal: return home PT Goal Formulation: With patient/family Time For Goal Achievement: 02/09/22 Potential to Achieve Goals: Good Progress towards PT goals: Progressing toward goals    Frequency    Min 3X/week      PT Plan Current plan remains appropriate    Co-evaluation              AM-PAC PT "6 Clicks" Mobility   Outcome Measure  Help needed turning from your back to your side while in a flat bed without using bedrails?: A Little Help needed moving from lying on your back to sitting on  the side of a flat bed without using bedrails?: A Little Help needed moving to and from a bed to a chair (including a wheelchair)?: A Little Help needed standing up from a chair using your arms (e.g., wheelchair or bedside chair)?: Total Help needed to walk in hospital room?: Total Help needed climbing 3-5 steps with a railing? : Total 6 Click Score: 12    End of Session   Activity Tolerance: Patient tolerated treatment well Patient left: in chair;with call bell/phone within reach;with chair alarm set Nurse Communication: Mobility status PT Visit Diagnosis: Muscle weakness (generalized) (M62.81)     Time: 0160-1093 PT Time Calculation (min) (ACUTE ONLY): 12 min  Charges:  $Therapeutic Activity: 8-22 mins                      [place]  Office [phone]    Rexanne Mano 01/30/2022, 11:09 AM

## 2022-01-30 NOTE — TOC Transition Note (Addendum)
Transition of Care [facility]) - CM/SW Discharge Note   Patient Details  Name: [name] MRN: [id number] Date of Birth: Feb 20, 1941  [facility] CM/SW Contact:  Tom-Johnson, Renea Ee, RN Phone Number: 01/30/2022, 12:57 PM   Clinical Narrative:     Patient is scheduled for discharge home with Hospice today. Hospice referral called in to [facility] with [facility] per family's request with acceptance voiced. Olivia Mackie states someone will be reaching out to patient's niece to schedule appointment.  No DME's needs noted at this time, Hospice nurse to assess if needed at home during her admission visit.  Niece to transport at discharge. No further TOC needs noted.   Final next level of care: Home w Hospice Care Barriers to Discharge: Barriers Resolved   Patient Goals and CMS Choice Patient states their goals for this hospitalization and ongoing recovery are:: To return home with Hospice. CMS Medicare.gov Compare Post Acute Care list provided to:: Patient Represenative (must comment) Choice offered to / list presented to :  (niece)  Discharge Placement                Patient to be transferred to facility by: Niece      Discharge Plan and Services   Discharge Planning Services: CM Consult            DME Arranged: N/A DME Agency: NA       HH Arranged: RN, PT, OT, Nurse's Aide [place] Agency: [facility] Date [place] Agency Contacted: 01/28/22 Time [place]: 1206 Representative spoke with at [place]: Delsa Sale  Social Determinants of Health (Opp) Interventions     Readmission Risk Interventions     No data to display

## 2022-01-30 NOTE — Progress Notes (Signed)
Patient was discharged home with niece in stable condition. Discharge instruction given and explained to patient and niece. They verbalized understanding. IV and condom cath removed. Patient and Niece have no questions. Patient going home on home hospice.

## 2022-01-30 NOTE — Discharge Summary (Signed)
Physician Discharge Summary   [name] LOV:[id number] DOB: 1941/03/26 DOA: 01/25/2022  PCP: [name], MD  Admit date: 01/25/2022 Discharge date:  01/30/2022 Barriers to discharge: none  Admitted From: Home Disposition:  Home with hospice Discharging physician: Dwyane Dee, MD  Recommendations for Outpatient Follow-up:  Continue transitioning to hospice Follow up SPEP, kappa/lambda, UPEP  Home Health:  Equipment/Devices:   Discharge Condition: fair CODE STATUS: DNR Diet recommendation:  Diet Orders (From admission, onward)     Start     Ordered   01/30/22 0000  Diet general        01/30/22 1219   01/25/22 1803  Diet regular Room service appropriate? Yes; Fluid consistency: Thin  Diet effective now       Question Answer Comment  Room service appropriate? 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 He was presumed to have a UTI and was started on treatment. He also had an elevated troponin and underwent further work-up.  Echo was notable for reduced EF, 40% with severe asymmetric left ventricular hypertrophy and LV global hypokinesis.  Cardiology was consulted for further assistance. A cardiac MRI was ordered which showed severe asymmetric LV hypertrophy and findings consistent with cardiac amyloidosis. He was previously referred to palliative care outpatient but had not yet established.  Due to his multiple comorbidities and poor functional status with new findings of cardiac amyloidosis, palliative care was consulted inpatient for assistance  with [place] discussions. Family elected for home with hospice and to change status to DNR.   Assessment and Plan:  UTI Acute metabolic encephalopathy -UA was cloudy with large leukocytes, greater than 50 WBCs -Started on IV ceftriaxone, unfortunately urine cultures were not sent so completed empiric course -Mental status improved -PT OT eval: Home Health   Elevated troponins New systolic CHF Cardiac amyloidosis  -Echo with EF of 40%, severe asymmetric LVH, severe atrial enlargement, speckled septum - cardiac MRI confirms consistency with cardiac amyloid (poor treatment candidate in general per cardiology as Casey as for ischemic workup) - given amyloid diagnosis, will at least check SPEP/UPEP and K/L light chains - palliative care consulted given ongoing functional decline with his underlying comorbidities and now new diagnosis of worsening heart function -After GOC discussions, family has elected for transitioning to home with hospice.  Status changed to DNR   Metastatic prostate cancer -With bone mets and adenopathy, followed by Dr. Alen Blew -History of poor compliance with Gillermina Phy -On Eligard for androgen deprivation every few months - agree with appropriateness of palliative care eval and focus on comfort    Acute on chronic anemia -Worsened since admission, likely secondary to hemodilution - iron stores low; Suspect baseline anemia is secondary to chronic disease, malignancy   Moderate protein calorie malnutrition - RD consulted, see note.    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     Discharge Instructions     Diet general   Complete by: As directed    Increase activity slowly   Complete by: As directed       Allergies as of 01/30/2022   No Known Allergies      Medication List     STOP taking these medications    acetaminophen-codeine 300-30 MG tablet Commonly known as: TYLENOL #3   atorvastatin 80 MG tablet Commonly known as: LIPITOR   clopidogrel 75 MG tablet Commonly known as: PLAVIX   cycloSPORINE 0.05 % ophthalmic emulsion Commonly known as: RESTASIS   gabapentin 300 MG capsule Commonly known as: NEURONTIN   ketorolac 0.5 % ophthalmic solution Commonly known as: ACULAR   oxyCODONE-acetaminophen 5-325 MG tablet Commonly known as: PERCOCET/ROXICET       TAKE these medications    brimonidine 0.2 % ophthalmic solution Commonly known as: ALPHAGAN Place 1 drop into the right eye 2 (two) times daily.   dorzolamidel-timolol 22.3-6.8 MG/ML Soln ophthalmic solution Commonly known as: COSOPT Place 1 drop into the right eye 2 (two) times daily. What changed: Another medication with the same name was removed. Continue taking this medication, and follow the directions you see here.   Ensure Take 237 mLs by mouth 2 (two) times daily between meals.   hydroxypropyl methylcellulose / hypromellose 2.5 % ophthalmic solution Commonly known as: ISOPTO TEARS / GONIOVISC Place 1 drop into both eyes 3 (three) times daily as needed for  dry eyes.   mirtazapine 7.5 MG tablet Commonly known as: REMERON Take 7.5 mg by mouth at bedtime.        [name], [name] The Follow up.   Specialty: Home Health Services Why: Home health arranged. They will contact you within 48 hours after discharge. Contact information: [place] [address] [phone]                No Known Allergies  Consultations: Palliative care  Procedures:   Discharge Exam: BP 108/64 (BP Location: Left Arm)   Pulse 66   Temp 98.2 F (36.8 C)   Resp 17   Wt 67.3 kg   SpO2 100%   BMI 19.57 kg/m  Physical Exam Constitutional:      General: He is not in acute distress.    Comments: Chronically ill appearing; slowed mentation   HENT:     Head: Normocephalic and atraumatic.     Mouth/Throat:     Mouth: Mucous membranes are moist.  Eyes:     Extraocular Movements: Extraocular movements intact.     Comments: Extremely poor gross vision   Cardiovascular:     Rate and Rhythm: Normal rate and regular rhythm.  Pulmonary:     Effort: Pulmonary effort is normal. No respiratory distress.     Breath sounds:  Normal breath sounds. No wheezing.  Abdominal:     General: Bowel sounds are normal. There is no distension.     Palpations: Abdomen is soft.     Tenderness: There is no abdominal tenderness.  Musculoskeletal:        General: Normal range of motion.     Cervical back: Normal range of motion and neck supple.  Skin:    General: Skin is warm and dry.  Neurological:     Mental Status: He is alert. Mental status is at baseline.  Psychiatric:        Mood and Affect: Mood normal.      The results of significant diagnostics from this hospitalization (including imaging, microbiology, ancillary and laboratory) are listed below for reference.   Microbiology: Recent Results (from the past 240 hour(s))  MRSA Next Gen by PCR, Nasal     Status: None   Collection Time:  01/29/22 11:06 AM   Specimen: Nasal Mucosa; Nasal Swab  Result Value Ref Range Status   MRSA by PCR Next Gen NOT DETECTED NOT DETECTED Final    Comment: (NOTE) The GeneXpert MRSA Assay (FDA approved for NASAL specimens only), is one component of a comprehensive MRSA colonization surveillance program. It is not intended to diagnose MRSA infection nor to guide or monitor treatment for MRSA infections. Test performance is not FDA approved in patients less than 96 years old. Performed at [facility], [place] [address] [place],  [address]      Labs: BNP (last 3 results) No results for input(s): "BNP" in the last 8760 hours. Basic Metabolic Panel: Recent Labs  Lab 01/25/22 1220 01/26/22 0756 01/29/22 0502  NA 138 138 138  K 3.8 3.6 3.4*  CL 104 107 105  CO2 '24 24 25  '$ GLUCOSE 101* 86 89  BUN '16 15 10  '$ CREATININE 1.04 1.01 0.81  CALCIUM 9.1 8.4* 8.6*   Liver Function Tests: Recent Labs  Lab 01/25/22 1220 01/26/22 0756  AST 19 16  ALT 10 8  ALKPHOS 149* 101  BILITOT 0.7 0.5  PROT 8.0 6.7  ALBUMIN 3.4* 2.7*   No results for input(s): "LIPASE", "AMYLASE" in the last 168 hours. No results for input(s): "AMMONIA" in the last 168 hours. CBC: Recent Labs  Lab 01/25/22 1220 01/26/22 0756 01/29/22 0502  WBC 9.2 8.4 5.3  NEUTROABS 6.8 5.7  --   HGB 9.1* 7.4* 7.9*  HCT 28.8* 22.7* 24.5*  MCV 85.5 83.8 83.1  PLT 298 240 272   Cardiac Enzymes: Recent Labs  Lab 01/25/22 1220  CKTOTAL 99   BNP: Invalid input(s): "POCBNP" CBG: No results for input(s): "GLUCAP" in the last 168 hours. D-Dimer No results for input(s): "DDIMER" in the last 72 hours. Hgb A1c No results for input(s): "HGBA1C" in the last 72 hours. Lipid Profile No results for input(s): "CHOL", "HDL", "LDLCALC", "TRIG", "CHOLHDL", "LDLDIRECT" in the last 72 hours. Thyroid function studies No results for input(s): "TSH", "T4TOTAL", "T3FREE", "THYROIDAB" in the last 72 hours.  Invalid  input(s): "FREET3" Anemia work up Recent Labs    01/29/22 0502  VITAMINB12 339  FOLATE 10.6  FERRITIN 1,663*  TIBC 169*  IRON 20*  RETICCTPCT 0.9   Urinalysis    Component Value Date/Time   COLORURINE YELLOW 01/25/2022 1611   APPEARANCEUR CLOUDY (A) 01/25/2022 1611   LABSPEC 1.015 01/25/2022 1611   PHURINE 5.0 01/25/2022 1611   GLUCOSEU NEGATIVE 01/25/2022 1611   HGBUR MODERATE (A) 01/25/2022 1611   HGBUR trace-intact  09/16/2007 1115   BILIRUBINUR NEGATIVE 01/25/2022 1611   KETONESUR NEGATIVE 01/25/2022 1611   PROTEINUR NEGATIVE 01/25/2022 1611   UROBILINOGEN 1.0 10/24/2013 1220   NITRITE NEGATIVE 01/25/2022 1611   LEUKOCYTESUR LARGE (A) 01/25/2022 1611   Sepsis Labs Recent Labs  Lab 01/25/22 1220 01/26/22 0756 01/29/22 0502  WBC 9.2 8.4 5.3   Microbiology Recent Results (from the past 240 hour(s))  MRSA Next Gen by PCR, Nasal     Status: None   Collection Time: 01/29/22 11:06 AM   Specimen: Nasal Mucosa; Nasal Swab  Result Value Ref Range Status   MRSA by PCR Next Gen NOT DETECTED NOT DETECTED Final    Comment: (NOTE) The GeneXpert MRSA Assay (FDA approved for NASAL specimens only), is one component of a comprehensive MRSA colonization surveillance program. It is not intended to diagnose MRSA infection nor to guide or monitor treatment for MRSA infections. Test performance is not FDA approved in patients less than 59 years old. Performed at [facility], [place] [address] [place], [place] [address]     Procedures/Studies: MR CARDIAC VELOCITY FLOW MAP  Result Date: 01/28/2022 CLINICAL DATA:  37M evalaute for amyloid,?RA mass, and pulmonary insufficiency EXAM: CARDIAC MRI TECHNIQUE: The patient was scanned on a 1.5 Tesla Siemens magnet. A dedicated cardiac coil was used. Functional imaging was done using Fiesta sequences. 2,3, and 4 chamber views were done to assess for RWMA's. Modified Simpson's rule using a short axis stack was used to calculate an  ejection fraction on a dedicated work Conservation officer, nature. The patient received 9 cc of Gadavist. After 10 minutes inversion recovery sequences were used to assess for infiltration and scar tissue. Phase contrast velocity mapping was performed. CONTRAST:  9 cc  of Gadavist FINDINGS: Left ventricle: -Mild dilatation -Mild systolic dysfunction -Severe asymmetric hypertrophy measuring up to 33m in basal septum (165min posterior wall) -ECV markedly elevated (70%) -Diffuse LGE LV EF: 41% (Normal 56-78%) Absolute volumes: LV EDV: 18985mNormal 77-195 mL) LV ESV: 112m62mormal 19-72 mL) LV SV: 77mL12mrmal 51-133 mL) CO: 5.2L/min (Normal 2.8-8.8 L/min) Indexed volumes: LV EDV: 102mL/73m (Normal 47-92 mL/sq-m) LV ESV: 60mL/s40m(Normal 13-30 mL/sq-m) LV SV: 42mL/sq74mNormal 32-62 mL/sq-m) CI: 2.8L/min/sq-m (Normal 1.7-4.2 L/min/sq-m) Right ventricle: Mild dilatation with mild systolic dysfunction RV EF:  39% (Normal 47-74%) Absolute volumes: RV EDV: 211mL (No33m 88-227 mL) RV ESV: 129mL (Nor55m23-103 mL) RV SV: 82mL (Norm29m2-138 mL) CO: 5.5L/min (Normal 2.8-8.8 L/min) Indexed volumes: RV EDV: 113mL/sq-m (79mal 55-105 mL/sq-m) RV ESV: 69mL/sq-m (N59ml 15-43 mL/sq-m) RV SV: 44mL/sq-m (No16m 32-64 mL/sq-m) CI: 2.9L/min/sq-m (Normal 1.7-4.2 L/min/sq-m) Left atrium: Moderate enlargement Right atrium: Mild enlargement.  No right atrial mass seen Mitral valve: Trivial regurgitation Aortic valve: No regurgitation Tricuspid valve: Trivial regurgitation Pulmonic valve: Trivial regurgitation Aorta: Dilated ascending aorta measuring 40mm Pericardi14mNormal IMPRESSION: 1. Findings consistent with cardiac amyloidosis, including diffuse late gadolinium enhancement, severe asymmetric LV hypertrophy, and markedly elevated extracellular volume (70%) 2.  No right atrial mass seen 3.  Mild LV dilatation with mild systolic dysfunction (EF 41%) 4.  Mild R95%ilatation with mild systolic dysfunction (EF 39%) 5.  Dilate09%ascending aorta measuring 40mm Electronic70m Signed   By: Christopher  SchOswaldo Milian4/2023 17:07   MR CARDIAC MORPHOLOGY W WO CONTRAST  Result Date: 01/28/2022 CLINICAL DATA:  37M evalaute for amyloid,?RA mass, and pulmonary insufficiency EXAM: CARDIAC MRI TECHNIQUE: The patient was scanned on a 1.5 Tesla Siemens magnet. A dedicated  cardiac coil was used. Functional imaging was done using Fiesta sequences. 2,3, and 4 chamber views were done to assess for RWMA's. Modified Simpson's rule using a short axis stack was used to calculate an ejection fraction on a dedicated work Conservation officer, nature. The patient received 9 cc of Gadavist. After 10 minutes inversion recovery sequences were used to assess for infiltration and scar tissue. Phase contrast velocity mapping was performed. CONTRAST:  9 cc  of Gadavist FINDINGS: Left ventricle: -Mild dilatation -Mild systolic dysfunction -Severe asymmetric hypertrophy measuring up to 55m in basal septum (159min posterior wall) -ECV markedly elevated (70%) -Diffuse LGE LV EF: 41% (Normal 56-78%) Absolute volumes: LV EDV: 18962mNormal 77-195 mL) LV ESV: 112m41mormal 19-72 mL) LV SV: 77mL24mrmal 51-133 mL) CO: 5.2L/min (Normal 2.8-8.8 L/min) Indexed volumes: LV EDV: 102mL/68m (Normal 47-92 mL/sq-m) LV ESV: 60mL/s27m(Normal 13-30 mL/sq-m) LV SV: 42mL/sq88mNormal 32-62 mL/sq-m) CI: 2.8L/min/sq-m (Normal 1.7-4.2 L/min/sq-m) Right ventricle: Mild dilatation with mild systolic dysfunction RV EF:  39% (Normal 47-74%) Absolute volumes: RV EDV: 211mL (No59m 88-227 mL) RV ESV: 129mL (Nor21m23-103 mL) RV SV: 82mL (Norm58m2-138 mL) CO: 5.5L/min (Normal 2.8-8.8 L/min) Indexed volumes: RV EDV: 113mL/sq-m (71mal 55-105 mL/sq-m) RV ESV: 69mL/sq-m (N81ml 15-43 mL/sq-m) RV SV: 44mL/sq-m (No88m 32-64 mL/sq-m) CI: 2.9L/min/sq-m (Normal 1.7-4.2 L/min/sq-m) Left atrium: Moderate enlargement Right atrium: Mild enlargement.  No right atrial mass seen Mitral valve:  Trivial regurgitation Aortic valve: No regurgitation Tricuspid valve: Trivial regurgitation Pulmonic valve: Trivial regurgitation Aorta: Dilated ascending aorta measuring 40mm Pericardi31mNormal IMPRESSION: 1. Findings consistent with cardiac amyloidosis, including diffuse late gadolinium enhancement, severe asymmetric LV hypertrophy, and markedly elevated extracellular volume (70%) 2.  No right atrial mass seen 3.  Mild LV dilatation with mild systolic dysfunction (EF 41%) 4.  Mild R19%ilatation with mild systolic dysfunction (EF 39%) 5.  Dilate50%scending aorta measuring 40mm Electronic30m Signed   By: Christopher  SchOswaldo Milian4/2023 17:04   ECHOCARDIOGRAM COMPLETE  Result Date: 01/26/2022    ECHOCARDIOGRAM REPORT   Patient Name:   [name]/05/2022 Medical Rec #:  [id number]    [id number]  73.0 in Accession #:    [id number]   [id number] 175.0 lb Date of Birth:  August 19, 1940    BSADec 02, 1942  2.033 m Patient Age:    81 years     BP:59         106/58 mmHg Patient Gender: M            HR:           71 bpm. Exam Location:  Inpatient Procedure: 2D Echo, Cardiac Doppler, Color Doppler and Strain Analysis Indications:    Elevated troponin  History:        Patient has prior history of Echocardiogram examinations, most                 recent 11/01/2018. Stroke; Risk Factors:Current Smoker,                 Dyslipidemia and Diabetes.  Sonographer:    Rachel Lane RDCSMerrie RoofPhys: [id number] PING T [id number] ejection fraction, by estimation, is 40%. The left ventricle has moderately decreased function. The left ventricle demonstrates global hypokinesis. There is severe asymmetric left ventricular hypertrophy. Left ventricular diastolic parameters are indeterminate. Elevated left atrial  pressure.  2. There is severe atrial enlargement, and severe LV hypertrophy (1.9 cm septum with speckled patter) and RV hypertrophy. Strain imaging shows apical sparing.  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  3. Right ventricular systolic function is normal. The right ventricular size is normal. Moderately increased right ventricular wall thickness.  4. Left atrial size was severely dilated.  5. The mitral valve is normal in structure. No evidence of mitral valve regurgitation. No evidence of mitral stenosis.  6. The aortic valve is tricuspid. Aortic valve regurgitation is not visualized. No aortic stenosis is present.  7. Pulmonic valve regurgitation is moderate.In some view, there is evidence of an echodensity in the right atrium. This could be related to compression of the right atrium by an non cardiac structure. Conclusion(s)/Recommendation(s): There are questions of cardiac amyloidosis, pulmonic insufficiency, and right atrial mass. Cardiac MRI may be reasonable to address these questions. FINDINGS  Left Ventricle: Left ventricular ejection fraction, by estimation, is 40%. The left ventricle has moderately decreased function. The left ventricle demonstrates global hypokinesis. The left ventricular internal cavity size was normal in size. There is severe asymmetric left ventricular hypertrophy. Left ventricular diastolic parameters are indeterminate. Elevated left atrial pressure. Right Ventricle: The right ventricular size is normal. Moderately increased right ventricular wall thickness. Right ventricular systolic function is normal. Left Atrium: Left atrial size was severely dilated. Right Atrium: Right atrial size was normal in size. Pericardium: There is no evidence of pericardial effusion. Mitral Valve: The mitral valve is normal in structure. Mild mitral annular calcification. No evidence of mitral valve regurgitation. No evidence of mitral valve stenosis. Tricuspid Valve: The tricuspid valve is normal in structure. Tricuspid valve regurgitation is not demonstrated. No evidence of tricuspid stenosis. Aortic Valve: The aortic valve is tricuspid. Aortic valve regurgitation is  not visualized. No aortic stenosis is present. Pulmonic Valve: The pulmonic valve was normal in structure. Pulmonic valve regurgitation is moderate. No evidence of pulmonic stenosis. Aorta: The aortic root and ascending aorta are structurally normal, with no evidence of dilitation. IAS/Shunts: No atrial level shunt detected by color flow Doppler.  LEFT VENTRICLE PLAX 2D LVIDd:         4.30 cm   Diastology LVIDs:         3.60 cm   LV e' medial:    3.81 cm/s LV PW:         1.50 cm   LV E/e' medial:  24.6 LV IVS:        1.80 cm   LV e' lateral:   7.72 cm/s LVOT diam:     2.30 cm   LV E/e' lateral: 12.2 LV SV:         72 LV SV Index:   35 LVOT Area:     4.15 cm  RIGHT VENTRICLE            IVC RV Basal diam:  3.60 cm    IVC diam: 2.10 cm RV S prime:     8.51 cm/s TAPSE (M-mode): 1.9 cm LEFT ATRIUM              Index        RIGHT ATRIUM           Index LA diam:        4.80 cm  2.36 cm/m   RA Area:     21.30 cm LA Vol (A2C):   130.0 ml 63.95 ml/m  RA Volume:   66.30 ml  32.61 ml/m LA Vol (A4C):  87.2 ml  42.89 ml/m LA Biplane Vol: 109.0 ml 53.62 ml/m  AORTIC VALVE LVOT Vmax:   97.90 cm/s LVOT Vmean:  62.600 cm/s LVOT VTI:    0.173 m  AORTA Ao Root diam: 3.90 cm Ao Asc diam:  3.80 cm MITRAL VALVE MV Area (PHT): 4.39 cm    SHUNTS MV Decel Time: 173 msec    Systemic VTI:  0.17 m MV E velocity: 93.90 cm/s  Systemic Diam: 2.30 cm MV A velocity: 30.00 cm/s MV E/A ratio:  3.13 Rudean Haskell MD Electronically signed by Rudean Haskell MD Signature Date/Time: 01/26/2022/1:24:03 PM    Final    CT Head Wo Contrast  Result Date: 01/25/2022 CLINICAL DATA:  Altered mental status EXAM: CT HEAD WITHOUT CONTRAST TECHNIQUE: Contiguous axial images were obtained from the base of the skull through the vertex without intravenous contrast. RADIATION DOSE REDUCTION: This exam was performed according to the departmental dose-optimization program which includes automated exposure control, adjustment of the mA and/or kV  according to patient size and/or use of iterative reconstruction technique. COMPARISON:  10/31/2018 FINDINGS: Brain: No acute intracranial findings are seen. There are no signs of bleeding within the cranium. Ventricles are not dilated. There is low-attenuation in the posterior right temporal lobe and in right occipital lobe suggesting possible old infarct. Vascular: Scattered arterial calcifications are seen. Skull: No fracture is seen in calvarium. Hyperostosis frontalis interna is seen. Sinuses/Orbits: Unremarkable. Other: None. IMPRESSION: No acute intracranial findings are seen in noncontrast CT brain. Old infarct is seen in the posterior right temporal lobe and right occipital lobe. Cortical sulci are prominent suggesting atrophy. Electronically Signed   By: Elmer Picker M.D.   On: 01/25/2022 16:53   DG Chest Port 1 View  Result Date: 01/25/2022 CLINICAL DATA:  shob EXAM: PORTABLE CHEST 1 VIEW COMPARISON:  March 19, 2020 FINDINGS: The heart size and mediastinal contours are mildly prominent, stable. Both lungs are clear and stable. Moderate thoracic spondylosis. IMPRESSION: No active disease. Electronically Signed   By: Frazier Richards M.D.   On: 01/25/2022 15:29     Time coordinating discharge: Over 30 minutes    Dwyane Dee, MD  [facility] 01/30/2022, 4:05 PM

## 2022-02-01 LAB — UPEP/UIFE/LIGHT CHAINS/TP, 24-HR UR
% BETA, Urine: 33.4 %
ALPHA 1 URINE: 4.1 %
Albumin, U: 14.4 %
Alpha 2, Urine: 15.5 %
Free Kappa Lt Chains,Ur: 105.87 mg/L — ABNORMAL HIGH (ref 1.17–86.46)
Free Kappa/Lambda Ratio: 3.22 (ref 1.83–14.26)
Free Lambda Lt Chains,Ur: 32.83 mg/L — ABNORMAL HIGH (ref 0.27–15.21)
GAMMA GLOBULIN URINE: 32.6 %
Total Protein, Urine-Ur/day: 196 mg/24 hr — ABNORMAL HIGH (ref 30–150)
Total Protein, Urine: 10.6 mg/dL
Total Volume: 1850

## 2022-02-01 LAB — PROTEIN ELECTROPHORESIS, SERUM
A/G Ratio: 0.7 (ref 0.7–1.7)
Albumin ELP: 2.7 g/dL — ABNORMAL LOW (ref 2.9–4.4)
Alpha-1-Globulin: 0.4 g/dL (ref 0.0–0.4)
Alpha-2-Globulin: 0.8 g/dL (ref 0.4–1.0)
Beta Globulin: 0.9 g/dL (ref 0.7–1.3)
Gamma Globulin: 1.6 g/dL (ref 0.4–1.8)
Globulin, Total: 3.8 g/dL (ref 2.2–3.9)
Total Protein ELP: 6.5 g/dL (ref 6.0–8.5)

## 2022-02-05 ENCOUNTER — Other Ambulatory Visit: Payer: Medicare Other

## 2022-02-05 DIAGNOSIS — Z515 Encounter for palliative care: Secondary | ICD-10-CM

## 2022-02-05 NOTE — Progress Notes (Signed)
COMMUNITY PALLIATIVE CARE SW NOTE  PATIENT NAME: [name] DOB: 1940-07-10 MRN: [id number]  PRIMARY CARE PROVIDER: [name], MD  RESPONSIBLE PARTY:  Acct ID - Guarantor Home Phone Work Phone Relationship Acct Type  [id number] RASHI, GIULIANI [phone]  Self P/F     [place], [place], [place] [id number]   SOCIAL WORK TELEPHONIC VISIT  PC SW completed a telephonic visit with patient's granddaughter. She stated that her mother asked her to call SW back. SW advised that she was following up patient's  status. She stated that since patient's hospitalization, he has been doing better, but is slowing down overall. His appetite is poor and they have downgraded him to softer foods. They have to remind patient to not to get up unassisted for fear of falls. SW offered to schedule a follow-up visit with patient, but she declined. She stated that patient was assessed last week by [facility] and did not see a need for an additional visit. SW confirmed that it was assessment for palliative care and she stated that it was with palliative care. SW advised her she will follow-up with patient 1-2 weeks to assess patient status. SW reinforced the palliative care contact number and encouraged her to call with any questions or changes in patient's condition.  No other concerns noted.    [address] [employer], [place]

## 2022-02-07 ENCOUNTER — Telehealth: Payer: Self-pay | Admitting: Oncology

## 2022-02-07 NOTE — Telephone Encounter (Signed)
Called patient regarding upcoming September appointment, spoke with patient's niece. Patient will be notified.

## 2022-02-11 ENCOUNTER — Other Ambulatory Visit: Payer: Medicare Other

## 2022-02-11 ENCOUNTER — Other Ambulatory Visit: Payer: Medicare Other | Admitting: *Deleted

## 2022-02-11 DIAGNOSIS — Z515 Encounter for palliative care: Secondary | ICD-10-CM

## 2022-02-11 NOTE — Progress Notes (Signed)
[facility] COMMUNITY PALLIATIVE CARE RN NOTE  PATIENT NAME: [name] DOB: Mar 31, 1941 MRN: [id number]  PRIMARY CARE PROVIDER: [name], MD  RESPONSIBLE PARTY: [name] (niece) Acct ID - Guarantor Home Phone Work Phone Relationship Acct Type  [id number] SIPRIANO, FENDLEY [phone]  Self P/F     [address], [place], [place] [address]    RN/SW telephonic encounter completed with patient's niece [name]. She states that patient's condition has worsened even further since 02/01/22 hospice AV where the family initially declined hospice services and opted for palliative care. They are now wanting hospice services. [name] feels that he is "on his way out." He is not eating. The last time he ate was on Friday 02/08/22. He is only taking in small amounts of water. Increased urinary incontinence as she reports when he drinks the water seems to "run right through him". Today he is not comprehending and is very irritable. Their goal is possible Optometrist placement once on hospice services. Family is present with patient during the day, but no one at night. They have cameras to check on him during the night. He sleeps all the time. 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 This information was sent to our [facility] hospice referral center. Referral center supervisor was able to obtain hospice order and will reach out to niece's [name] or [name] to schedule date/time of hospice admission. Family is appreciative.    Daryl Eastern, RN BSN

## 2022-02-12 NOTE — Progress Notes (Signed)
COMMUNITY PALLIATIVE CARE SW NOTE  PATIENT NAME: [name] DOB: Jul 02, 1940 MRN: [id number]  PRIMARY CARE PROVIDER: [name], MD  RESPONSIBLE PARTY:  Acct ID - Guarantor Home Phone Work Phone Relationship Acct Type  [id number] GARRETH, BURNSWORTH [phone]  Self P/F     [address], [place], [place] [address]   SOCIAL WORK TELEPHONIC ENCOUNTER  SW and RN-M. Nadara Mustard completed a joint telephonic encounter with patient's daughter-[name]. She called in to advise that patient's condition had declined since he had a hospice consult on 02/01/22. The family declined services at that time and opted for palliative care services.  Butch Penny reported that she feels patient is "on his way out". 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 He has stopped eating since Friday. He is "leaking" as he is having increased urinary incontinence. When patient take in any fluids, it is coming out. He appears to be having cognitive deficits where he is not comprehending and is not able to follow directions. He is "short " with them as he appears to be more irritable. He is reaching and feeling for things. Charlyne Quale requested another hospice consult for patient as she feels he is more appropriate now and the family is now ready for hospice.  The family is taking turns caring for patient during the day while working full-time jobs. Patient has periods at night where he is alone. The family monitor him via security cameras. They are interested in [facility] as well. The palliative care RN will forward this information to the referral center for follow-up.  SW encouraged Butch Penny to call back with any additional concerns, but the referral center will be following up with her.    [address] [place], [place]

## 2022-03-12 ENCOUNTER — Ambulatory Visit: Payer: Medicare Other | Admitting: Oncology

## 2022-03-12 ENCOUNTER — Ambulatory Visit: Payer: Medicare Other

## 2022-03-12 ENCOUNTER — Other Ambulatory Visit: Payer: Medicare Other

## 2022-03-20 ENCOUNTER — Telehealth: Payer: Self-pay

## 2022-03-20 NOTE — Patient Outreach (Signed)
  Care Coordination   03/20/2022 Name: [name] MRN: [id number] DOB: 12/04/1940   Care Coordination Outreach Attempts:  An unsuccessful telephone outreach was attempted today to offer the patient information about available care coordination services as a benefit of their health plan.   Follow Up Plan:  Additional outreach attempts will be made to offer the patient care coordination information and services.   Encounter Outcome:  No Answer  Care Coordination Interventions Activated:  No   Care Coordination Interventions:  No, not indicated    [place] [facility] [phone]

## 2022-03-28 ENCOUNTER — Telehealth: Payer: Self-pay

## 2022-03-28 NOTE — Patient Outreach (Signed)
  Care Coordination      03/28/2022 Name: [name] MRN: [id number] DOB: 10/15/1940  [name] is a 81 y.o. year old male who sees [name], MD for primary care.  I spoke with Mr. Doten niece Gwynneth Macleod. Informed that Mr. Welker recently passed away. Will update chart to reflect patient is deceased.   SDOH assessments and interventions completed:  No    Care Coordination Interventions Activated:  No   Care Coordination Interventions:  No, not indicated   [place] [facility] [phone]

## 2022-04-17 DEATH — deceased
